# Patient Record
Sex: Male | Born: 1940
Health system: Southern US, Community
[De-identification: ages and names within clinical notes are randomized; demographics above are authoritative.]

## PROBLEM LIST (undated history)

## (undated) DIAGNOSIS — R011 Cardiac murmur, unspecified: Secondary | ICD-10-CM

## (undated) DIAGNOSIS — Z22322 Carrier or suspected carrier of Methicillin resistant Staphylococcus aureus: Secondary | ICD-10-CM

## (undated) DIAGNOSIS — J189 Pneumonia, unspecified organism: Secondary | ICD-10-CM

## (undated) DIAGNOSIS — Z87898 Personal history of other specified conditions: Secondary | ICD-10-CM

## (undated) DIAGNOSIS — R7301 Impaired fasting glucose: Secondary | ICD-10-CM

## (undated) DIAGNOSIS — N183 Chronic kidney disease, stage 3 unspecified: Secondary | ICD-10-CM

## (undated) DIAGNOSIS — N529 Male erectile dysfunction, unspecified: Secondary | ICD-10-CM

## (undated) DIAGNOSIS — R351 Nocturia: Secondary | ICD-10-CM

## (undated) DIAGNOSIS — I519 Heart disease, unspecified: Secondary | ICD-10-CM

## (undated) DIAGNOSIS — C4491 Basal cell carcinoma of skin, unspecified: Secondary | ICD-10-CM

## (undated) DIAGNOSIS — M1990 Unspecified osteoarthritis, unspecified site: Secondary | ICD-10-CM

## (undated) DIAGNOSIS — K760 Fatty (change of) liver, not elsewhere classified: Secondary | ICD-10-CM

## (undated) DIAGNOSIS — I2510 Atherosclerotic heart disease of native coronary artery without angina pectoris: Secondary | ICD-10-CM

## (undated) DIAGNOSIS — R569 Unspecified convulsions: Secondary | ICD-10-CM

## (undated) DIAGNOSIS — R519 Headache, unspecified: Secondary | ICD-10-CM

## (undated) DIAGNOSIS — G620 Drug-induced polyneuropathy: Secondary | ICD-10-CM

## (undated) DIAGNOSIS — Z8614 Personal history of Methicillin resistant Staphylococcus aureus infection: Secondary | ICD-10-CM

## (undated) DIAGNOSIS — C61 Malignant neoplasm of prostate: Secondary | ICD-10-CM

## (undated) DIAGNOSIS — K219 Gastro-esophageal reflux disease without esophagitis: Secondary | ICD-10-CM

## (undated) DIAGNOSIS — K5730 Diverticulosis of large intestine without perforation or abscess without bleeding: Secondary | ICD-10-CM

## (undated) DIAGNOSIS — Z974 Presence of external hearing-aid: Secondary | ICD-10-CM

## (undated) DIAGNOSIS — E785 Hyperlipidemia, unspecified: Secondary | ICD-10-CM

## (undated) DIAGNOSIS — K635 Polyp of colon: Secondary | ICD-10-CM

## (undated) DIAGNOSIS — R0602 Shortness of breath: Secondary | ICD-10-CM

## (undated) DIAGNOSIS — E8581 Light chain (AL) amyloidosis: Secondary | ICD-10-CM

## (undated) DIAGNOSIS — Z85828 Personal history of other malignant neoplasm of skin: Secondary | ICD-10-CM

## (undated) DIAGNOSIS — Z973 Presence of spectacles and contact lenses: Secondary | ICD-10-CM

## (undated) DIAGNOSIS — Z860100 Personal history of colon polyps, unspecified: Secondary | ICD-10-CM

## (undated) DIAGNOSIS — E859 Amyloidosis, unspecified: Secondary | ICD-10-CM

## (undated) DIAGNOSIS — R0600 Dyspnea, unspecified: Secondary | ICD-10-CM

## (undated) DIAGNOSIS — R0789 Other chest pain: Secondary | ICD-10-CM

## (undated) DIAGNOSIS — U071 COVID-19: Secondary | ICD-10-CM

## (undated) DIAGNOSIS — I10 Essential (primary) hypertension: Secondary | ICD-10-CM

## (undated) DIAGNOSIS — G40909 Epilepsy, unspecified, not intractable, without status epilepticus: Secondary | ICD-10-CM

## (undated) DIAGNOSIS — Z9981 Dependence on supplemental oxygen: Secondary | ICD-10-CM

## (undated) DIAGNOSIS — N189 Chronic kidney disease, unspecified: Secondary | ICD-10-CM

## (undated) DIAGNOSIS — T451X5A Adverse effect of antineoplastic and immunosuppressive drugs, initial encounter: Secondary | ICD-10-CM

## (undated) DIAGNOSIS — K449 Diaphragmatic hernia without obstruction or gangrene: Secondary | ICD-10-CM

## (undated) DIAGNOSIS — Z860101 Personal history of adenomatous and serrated colon polyps: Secondary | ICD-10-CM

## (undated) DIAGNOSIS — H269 Unspecified cataract: Secondary | ICD-10-CM

## (undated) DIAGNOSIS — G4733 Obstructive sleep apnea (adult) (pediatric): Secondary | ICD-10-CM

## (undated) HISTORY — DX: Unspecified convulsions: R56.9

## (undated) HISTORY — PX: ROTATOR CUFF REPAIR: SHX139

## (undated) HISTORY — DX: Heart disease, unspecified: I51.9

## (undated) HISTORY — DX: Other chest pain: R07.89

## (undated) HISTORY — DX: Unspecified cataract: H26.9

## (undated) HISTORY — PX: SPINE SURGERY: SHX786

## (undated) HISTORY — PX: TRIGGER FINGER RELEASE: SHX641

## (undated) HISTORY — DX: Essential (primary) hypertension: I10

## (undated) HISTORY — DX: Gastro-esophageal reflux disease without esophagitis: K21.9

## (undated) HISTORY — DX: Personal history of colonic polyps: Z86.010

## (undated) HISTORY — PX: VASECTOMY: SHX75

## (undated) HISTORY — DX: Atherosclerotic heart disease of native coronary artery without angina pectoris: I25.10

## (undated) HISTORY — DX: Carrier or suspected carrier of methicillin resistant Staphylococcus aureus: Z22.322

## (undated) HISTORY — DX: Amyloidosis, unspecified: E85.9

## (undated) HISTORY — DX: Personal history of adenomatous and serrated colon polyps: Z86.0101

## (undated) HISTORY — PX: OTHER SURGICAL HISTORY: SHX169

## (undated) HISTORY — DX: Male erectile dysfunction, unspecified: N52.9

## (undated) HISTORY — DX: Polyp of colon: K63.5

## (undated) HISTORY — PX: NECK SURGERY: SHX720

## (undated) HISTORY — PX: FOOT SURGERY: SHX648

## (undated) HISTORY — DX: Diaphragmatic hernia without obstruction or gangrene: K44.9

## (undated) HISTORY — PX: HERNIA REPAIR: SHX51

## (undated) HISTORY — PX: NOSE SURGERY: SHX723

## (undated) HISTORY — PX: BUNIONECTOMY: SHX129

## (undated) HISTORY — PX: SKIN BIOPSY: SHX1

## (undated) HISTORY — PX: CERVICAL FUSION: SHX112

## (undated) HISTORY — PX: SHOULDER SURGERY: SHX246

## (undated) HISTORY — DX: Diverticulosis of large intestine without perforation or abscess without bleeding: K57.30

## (undated) HISTORY — DX: Epilepsy, unspecified, not intractable, without status epilepticus: G40.909

## (undated) HISTORY — PX: KNEE ARTHROSCOPY: SUR90

## (undated) HISTORY — PX: CARDIAC CATHETERIZATION: SHX172

## (undated) HISTORY — DX: COVID-19: U07.1

## (undated) HISTORY — DX: Chronic kidney disease, unspecified: N18.9

## (undated) HISTORY — PX: NASAL SEPTUM SURGERY: SHX37

## (undated) HISTORY — DX: Impaired fasting glucose: R73.01

## (undated) HISTORY — DX: Unspecified osteoarthritis, unspecified site: M19.90

## (undated) HISTORY — DX: Hyperlipidemia, unspecified: E78.5

## (undated) HISTORY — DX: Basal cell carcinoma of skin, unspecified: C44.91

---

## 1986-04-25 DIAGNOSIS — Z8781 Personal history of (healed) traumatic fracture: Secondary | ICD-10-CM

## 1986-04-25 HISTORY — DX: Personal history of (healed) traumatic fracture: Z87.81

## 1986-04-25 HISTORY — PX: CERVICAL FUSION: SHX112

## 1992-04-25 HISTORY — PX: CARPAL TUNNEL RELEASE: SHX101

## 1994-04-25 HISTORY — PX: KNEE SURGERY: SHX244

## 1994-04-25 HISTORY — PX: KNEE ARTHROSCOPY: SUR90

## 1998-09-14 ENCOUNTER — Encounter: Payer: Self-pay | Admitting: *Deleted

## 1998-09-14 ENCOUNTER — Emergency Department (HOSPITAL_COMMUNITY): Admission: EM | Admit: 1998-09-14 | Discharge: 1998-09-14 | Payer: Self-pay | Admitting: Emergency Medicine

## 1998-11-20 ENCOUNTER — Ambulatory Visit (HOSPITAL_BASED_OUTPATIENT_CLINIC_OR_DEPARTMENT_OTHER): Admission: RE | Admit: 1998-11-20 | Discharge: 1998-11-20 | Payer: Self-pay | Admitting: Orthopaedic Surgery

## 1999-05-21 ENCOUNTER — Ambulatory Visit (HOSPITAL_COMMUNITY): Admission: RE | Admit: 1999-05-21 | Discharge: 1999-05-21 | Payer: Self-pay | Admitting: Cardiology

## 1999-05-21 ENCOUNTER — Encounter: Payer: Self-pay | Admitting: Cardiology

## 2000-09-12 ENCOUNTER — Encounter: Admission: RE | Admit: 2000-09-12 | Discharge: 2000-11-22 | Payer: Self-pay | Admitting: Pulmonary Disease

## 2000-12-29 ENCOUNTER — Ambulatory Visit (HOSPITAL_COMMUNITY): Admission: AD | Admit: 2000-12-29 | Discharge: 2000-12-29 | Payer: Self-pay | Admitting: Cardiology

## 2001-05-06 ENCOUNTER — Emergency Department (HOSPITAL_COMMUNITY): Admission: EM | Admit: 2001-05-06 | Discharge: 2001-05-06 | Payer: Self-pay | Admitting: Emergency Medicine

## 2001-09-07 ENCOUNTER — Ambulatory Visit (HOSPITAL_COMMUNITY): Admission: RE | Admit: 2001-09-07 | Discharge: 2001-09-07 | Payer: Self-pay | Admitting: Internal Medicine

## 2001-09-13 ENCOUNTER — Encounter: Payer: Self-pay | Admitting: Internal Medicine

## 2001-09-13 ENCOUNTER — Ambulatory Visit (HOSPITAL_COMMUNITY): Admission: RE | Admit: 2001-09-13 | Discharge: 2001-09-13 | Payer: Self-pay | Admitting: Internal Medicine

## 2002-09-23 ENCOUNTER — Ambulatory Visit (HOSPITAL_COMMUNITY): Admission: RE | Admit: 2002-09-23 | Discharge: 2002-09-23 | Payer: Self-pay | Admitting: *Deleted

## 2002-09-23 ENCOUNTER — Encounter: Payer: Self-pay | Admitting: *Deleted

## 2004-05-10 ENCOUNTER — Ambulatory Visit: Payer: Self-pay | Admitting: Internal Medicine

## 2004-05-17 ENCOUNTER — Ambulatory Visit: Payer: Self-pay | Admitting: Internal Medicine

## 2004-05-26 ENCOUNTER — Ambulatory Visit: Payer: Self-pay

## 2005-03-18 ENCOUNTER — Ambulatory Visit (HOSPITAL_BASED_OUTPATIENT_CLINIC_OR_DEPARTMENT_OTHER): Admission: RE | Admit: 2005-03-18 | Discharge: 2005-03-18 | Payer: Self-pay | Admitting: Otolaryngology

## 2005-03-27 ENCOUNTER — Ambulatory Visit: Payer: Self-pay | Admitting: Internal Medicine

## 2005-07-07 ENCOUNTER — Ambulatory Visit: Payer: Self-pay | Admitting: Internal Medicine

## 2005-07-20 ENCOUNTER — Ambulatory Visit: Payer: Self-pay | Admitting: Internal Medicine

## 2005-08-24 ENCOUNTER — Ambulatory Visit: Payer: Self-pay | Admitting: Cardiology

## 2005-12-17 ENCOUNTER — Emergency Department (HOSPITAL_COMMUNITY): Admission: EM | Admit: 2005-12-17 | Discharge: 2005-12-18 | Payer: Self-pay | Admitting: Emergency Medicine

## 2006-05-23 ENCOUNTER — Ambulatory Visit: Payer: Self-pay | Admitting: Internal Medicine

## 2006-06-10 ENCOUNTER — Ambulatory Visit: Payer: Self-pay | Admitting: Family Medicine

## 2006-07-03 ENCOUNTER — Ambulatory Visit: Payer: Self-pay | Admitting: Internal Medicine

## 2006-11-20 ENCOUNTER — Ambulatory Visit (HOSPITAL_COMMUNITY): Admission: RE | Admit: 2006-11-20 | Discharge: 2006-11-20 | Payer: Self-pay | Admitting: Internal Medicine

## 2006-11-20 ENCOUNTER — Ambulatory Visit: Payer: Self-pay | Admitting: Internal Medicine

## 2007-04-30 ENCOUNTER — Encounter: Payer: Self-pay | Admitting: *Deleted

## 2007-04-30 DIAGNOSIS — S32009A Unspecified fracture of unspecified lumbar vertebra, initial encounter for closed fracture: Secondary | ICD-10-CM | POA: Insufficient documentation

## 2007-04-30 DIAGNOSIS — I10 Essential (primary) hypertension: Secondary | ICD-10-CM | POA: Insufficient documentation

## 2007-04-30 DIAGNOSIS — K219 Gastro-esophageal reflux disease without esophagitis: Secondary | ICD-10-CM | POA: Insufficient documentation

## 2007-04-30 DIAGNOSIS — E785 Hyperlipidemia, unspecified: Secondary | ICD-10-CM | POA: Insufficient documentation

## 2007-04-30 DIAGNOSIS — R569 Unspecified convulsions: Secondary | ICD-10-CM | POA: Insufficient documentation

## 2007-04-30 DIAGNOSIS — Z8719 Personal history of other diseases of the digestive system: Secondary | ICD-10-CM | POA: Insufficient documentation

## 2007-04-30 DIAGNOSIS — Z9889 Other specified postprocedural states: Secondary | ICD-10-CM | POA: Insufficient documentation

## 2007-10-11 ENCOUNTER — Encounter: Payer: Self-pay | Admitting: Internal Medicine

## 2008-05-02 ENCOUNTER — Ambulatory Visit: Payer: Self-pay | Admitting: Cardiology

## 2008-05-09 ENCOUNTER — Ambulatory Visit: Payer: Self-pay

## 2008-05-13 ENCOUNTER — Ambulatory Visit: Payer: Self-pay | Admitting: Cardiology

## 2008-05-13 LAB — CONVERTED CEMR LAB
CO2: 31 meq/L (ref 19–32)
Calcium: 9.1 mg/dL (ref 8.4–10.5)
Chloride: 105 meq/L (ref 96–112)
Creatinine, Ser: 0.9 mg/dL (ref 0.4–1.5)
GFR calc Af Amer: 109 mL/min
Glucose, Bld: 88 mg/dL (ref 70–99)
HCT: 46.2 % (ref 39.0–52.0)
Hemoglobin: 16.1 g/dL (ref 13.0–17.0)
INR: 1 (ref 0.8–1.0)
MCV: 90.8 fL (ref 78.0–100.0)
Monocytes Absolute: 0.6 10*3/uL (ref 0.1–1.0)
Platelets: 165 10*3/uL (ref 150–400)
Prothrombin Time: 11.1 s (ref 10.9–13.3)
RBC: 5.09 M/uL (ref 4.22–5.81)
RDW: 11.7 % (ref 11.5–14.6)
WBC: 8.6 10*3/uL (ref 4.5–10.5)

## 2008-05-16 ENCOUNTER — Inpatient Hospital Stay (HOSPITAL_BASED_OUTPATIENT_CLINIC_OR_DEPARTMENT_OTHER): Admission: RE | Admit: 2008-05-16 | Discharge: 2008-05-16 | Payer: Self-pay | Admitting: Cardiology

## 2008-05-16 ENCOUNTER — Ambulatory Visit: Payer: Self-pay | Admitting: Cardiology

## 2008-05-29 ENCOUNTER — Ambulatory Visit: Payer: Self-pay | Admitting: Cardiology

## 2009-04-25 HISTORY — PX: CARDIAC CATHETERIZATION: SHX172

## 2009-05-18 ENCOUNTER — Ambulatory Visit (HOSPITAL_COMMUNITY): Admission: RE | Admit: 2009-05-18 | Discharge: 2009-05-18 | Payer: Self-pay | Admitting: Internal Medicine

## 2009-07-05 ENCOUNTER — Observation Stay (HOSPITAL_COMMUNITY): Admission: EM | Admit: 2009-07-05 | Discharge: 2009-07-08 | Payer: Self-pay | Admitting: Emergency Medicine

## 2009-07-05 ENCOUNTER — Ambulatory Visit: Payer: Self-pay | Admitting: Cardiology

## 2009-07-21 ENCOUNTER — Ambulatory Visit: Payer: Self-pay | Admitting: Cardiology

## 2009-07-21 DIAGNOSIS — R079 Chest pain, unspecified: Secondary | ICD-10-CM | POA: Insufficient documentation

## 2009-07-21 DIAGNOSIS — I2510 Atherosclerotic heart disease of native coronary artery without angina pectoris: Secondary | ICD-10-CM | POA: Insufficient documentation

## 2009-08-29 ENCOUNTER — Encounter: Admission: RE | Admit: 2009-08-29 | Discharge: 2009-08-29 | Payer: Self-pay | Admitting: Orthopaedic Surgery

## 2009-09-02 ENCOUNTER — Encounter: Payer: Self-pay | Admitting: Cardiology

## 2009-09-07 ENCOUNTER — Ambulatory Visit (HOSPITAL_COMMUNITY)
Admission: RE | Admit: 2009-09-07 | Discharge: 2009-09-08 | Payer: Self-pay | Source: Home / Self Care | Admitting: Orthopaedic Surgery

## 2010-02-18 ENCOUNTER — Encounter: Payer: Self-pay | Admitting: Cardiology

## 2010-02-19 ENCOUNTER — Encounter: Payer: Self-pay | Admitting: Cardiology

## 2010-02-19 ENCOUNTER — Ambulatory Visit: Payer: Self-pay | Admitting: Cardiology

## 2010-02-19 DIAGNOSIS — R42 Dizziness and giddiness: Secondary | ICD-10-CM | POA: Insufficient documentation

## 2010-03-03 ENCOUNTER — Encounter: Payer: Self-pay | Admitting: Cardiology

## 2010-03-03 ENCOUNTER — Ambulatory Visit (HOSPITAL_COMMUNITY): Admission: RE | Admit: 2010-03-03 | Discharge: 2010-03-03 | Payer: Self-pay | Admitting: Cardiology

## 2010-03-03 ENCOUNTER — Ambulatory Visit: Payer: Self-pay

## 2010-03-03 ENCOUNTER — Ambulatory Visit: Payer: Self-pay | Admitting: Cardiology

## 2010-03-11 ENCOUNTER — Ambulatory Visit: Payer: Self-pay | Admitting: Cardiology

## 2010-04-13 ENCOUNTER — Ambulatory Visit: Payer: Self-pay | Admitting: Cardiology

## 2010-05-16 ENCOUNTER — Encounter: Payer: Self-pay | Admitting: Otolaryngology

## 2010-05-27 NOTE — Assessment & Plan Note (Signed)
Summary: chest pain/dizziness  Medications Added OMEPRAZOLE 20 MG CPDR (OMEPRAZOLE) 1 tab once daily AZOR 5-20 MG TABS (AMLODIPINE-OLMESARTAN) 1/2 tab once daily CALCIUM CARBONATE 600 MG TABS (CALCIUM CARBONATE) 2 tab two times a day CRESTOR 40 MG TABS (ROSUVASTATIN CALCIUM) 1 tab at bedtime FISH OIL 1000 MG CAPS (OMEGA-3 FATTY ACIDS) 1 cap two times a day HYDROCHLOROTHIAZIDE 12.5 MG TABS (HYDROCHLOROTHIAZIDE) Take one tablet by mouth daily.        Visit Type:  rov Primary Provider:  Dr. Clelia Croft  CC:  chest pain, left arm pain, sob, and edema/ankles.....  History of Present Illness: Alex [name] is 70 years old and came in today for an unscheduled visit because of dizziness. These symptoms have been going on about 6 weeks. He describes a feeling of lightheadedness and a feeling of floating. He's had no associated weakness or numbness. These are not precipitated by postural changes or anything else that he can identify. They occur every day or every other day and lasts a few seconds.  He was also told of a heart murmur by his primary care physician.  He has a history of nonobstructive coronary disease and was involved in the reversal trial. His last catheterization was in March of 2011 at which time he had nonobstructive coronary disease.  His other problems include GERD and hyperlipidemia.  Current Medications (verified): 1)  Aspirin 81 Mg  Tabs (Aspirin) .... Take One Tablet Once Daily 2)  Dilantin 100 Mg  Caps (Phenytoin Sodium Extended) .... 2 By Mouth Two Times A Day 3)  Nitrostat 0.4 Mg Subl (Nitroglycerin) .[name] Kitchen.. 1 By Mouth As Needed  For Chest Pain 4)  Omeprazole 20 Mg Cpdr (Omeprazole) .[name] Kitchen.. 1 Tab Once Daily 5)  Azor 5-20 Mg Tabs (Amlodipine-Olmesartan) .... 1/2 Tab Once Daily 6)  Calcium Carbonate 600 Mg Tabs (Calcium Carbonate) .... 2 Tab Two Times A Day 7)  Crestor 40 Mg Tabs (Rosuvastatin Calcium) .[name] Kitchen.. 1 Tab At Bedtime 8)  Fish Oil 1000 Mg Caps (Omega-3 Fatty Acids) .[name] Kitchen.. 1 Cap  Two Times A Day 9)  Hydrochlorothiazide 12.5 Mg Tabs (Hydrochlorothiazide) .... Take One Tablet By Mouth Daily.  Allergies: 1)  ! * Altace  Past History:  Past Medical History: Reviewed history from 04/30/2007 and no changes required. DIVERTICULOSIS, COLON, HX OF (ICD-V12.79) * NONOBSTRUCTIVE CORONARY ARTERY DISEASE SEIZURE DISORDER (ICD-780.39) GASTROESOPHAGEAL REFLUX DISEASE (ICD-530.81) HYPERTENSION (ICD-401.9) HYPERLIPIDEMIA (ICD-272.4)    Review of Systems       ROS is negative except as outlined in HPI.   Vital Signs:  Patient profile:   70 year old male Height:      70 inches Weight:      225.4 pounds Pulse rate:   61 / minute Pulse (ortho):   57 / minute Pulse rhythm:   irregular BP sitting:   108 / 70  (left arm) BP standing:   113 / 72 Cuff size:   large  Vitals Entered By: Danielle Rankin, CMA (February 19, 2010 8:37 AM)  Serial Vital Signs/Assessments:  Time      Position  BP       Pulse  Resp  Temp     By 9:21 AM   Lying [place]  111/70   54                    Danielle Rankin, CMA 9:24 AM   Sitting   112/73   55  Danielle Rankin, CMA 9:25 AM   Standing  113/72   [address], [place] 9:28 AM   Standing  117/74   [address] [place] 9:30 AM   Standing  118/79   60                    Danielle Rankin, [place]  Comments: 9:21 AM no sxms By: Danielle Rankin, CMA  9:24 AM no sxms By: Danielle Rankin, CMA  9:25 AM lightheaeded By: Danielle Rankin, CMA  9:28 AM no sxms By: Danielle Rankin, CMA  9:30 AM no sxms By: Danielle Rankin, CMA    Physical Exam  Additional Exam:  Gen. Well-nourished, in no distress   Neck: No JVD, thyroid not enlarged, no carotid bruits Lungs: No tachypnea, clear without rales, rhonchi or wheezes Cardiovascular: Rhythm regular, PMI not displaced,  heart sounds  normal, grade 2/6 systolic ejection murmur, no peripheral edema, pulses normal in all 4 extremities. Abdomen: BS normal, abdomen soft and non-tender  without masses or organomegaly, no hepatosplenomegaly. MS: No deformities, no cyanosis or clubbing   Neuro:  No focal sns   Skin:  no lesions    Impression & Recommendations:  Problem # 1:  DIZZINESS (ICD-780.4)  I'm not sure of the etiology of the dizziness. It could be related to sinus pauses since he has a borderline bradycardia. We will stop the Coreg. It could be a form of TIA and we'll plan to get a 2-D echocardiogram and carotid Dopplers. If he is not better over the next week off of Coreg and we'll get a one-week monitor. I'll see him back in a few weeks.  Orders: EKG w/ Interpretation (93000) Carotid Duplex (Carotid Duplex) Echocardiogram (Echo)  Problem # 2:  CAD, NATIVE VESSEL (ICD-414.01)  He has nonobstructive disease by last catheterization in March of 2011. The following medications were removed from the medication list:    Metoprolol Succinate 25 Mg Tb24 (Metoprolol succinate) .[name] Kitchen... Take one tablet once daily    Coreg 3.125 Mg Tabs (Carvedilol) .[name] Kitchen... 1 by mouth two times a day His updated medication list for this problem includes:    Aspirin 81 Mg Tabs (Aspirin) .[name] Kitchen... Take one tablet once daily    Nitrostat 0.4 Mg Subl (Nitroglycerin) .[name] Kitchen... 1 by mouth as needed  for chest pain    Azor 5-20 Mg Tabs (Amlodipine-olmesartan) .[name] Kitchen... 1/2 tab once daily  The following medications were removed from the medication list:    Metoprolol Succinate 25 Mg Tb24 (Metoprolol succinate) .[name] Kitchen... Take one tablet once daily His updated medication list for this problem includes:    Aspirin 81 Mg Tabs (Aspirin) .[name] Kitchen... Take one tablet once daily    Nitrostat 0.4 Mg Subl (Nitroglycerin) .[name] Kitchen... 1 by mouth as needed  for chest pain    Coreg 3.125 Mg Tabs (Carvedilol) .[name] Kitchen... 1 by mouth two times a day    Azor 5-20 Mg Tabs (Amlodipine-olmesartan) .[name] Kitchen... 1/2 tab once daily  Orders: EKG w/ Interpretation (93000) Carotid Duplex (Carotid Duplex) Echocardiogram (Echo)  Problem # 3:  HYPERLIPIDEMIA  (ICD-272.4)  This is managed with Crestor. His updated medication list for this problem includes:    Crestor 40 Mg Tabs (Rosuvastatin calcium) .[name] Kitchen... 1 tab at bedtime  His updated medication list for this problem  includes:    Crestor 40 Mg Tabs (Rosuvastatin calcium) .[name] Kitchen... 1 tab at bedtime  Patient Instructions: 1)  Your physician has requested that you have a carotid duplex. This test is an ultrasound of the carotid arteries in your neck. It looks at blood flow through these arteries that supply the brain with blood. Allow one hour for this exam. There are no restrictions or special instructions. 2)  Your physician has requested that you have an echocardiogram.  Echocardiography is a painless test that uses sound waves to create images of your heart. It provides your doctor with information about the size and shape of your heart and how well your heart's chambers and valves are working.  This procedure takes approximately one hour. There are no restrictions for this procedure. 3)  Stop Coreg (carvedilol). 4)  If your symptoms are not better off the coreg by the middle of next week, then call our office and we will order a 1 week heart monitor for you.  5)  Restart Hydrochlorothiazide (HCTZ) 12.5mg  once daily. 6)  Your physician recommends that you schedule a follow-up appointment in: 3 weeks.

## 2010-05-27 NOTE — Assessment & Plan Note (Signed)
Summary: 3wk f/u echo and carortid  Medications Added CALCIUM CARBONATE 600 MG TABS (CALCIUM CARBONATE) Take 1 tablet by mouth two times a day        Visit Type:  Follow-up Primary Provider:  Dr. Clelia Croft  CC:  Chest pains.  History of Present Illness: Alex [name] is 70 years old and came in today for followup evaluation of dizziness. These symptoms have been going on about 6 weeks. He describes a feeling of lightheadedness and a feeling of floating. He's had no associated weakness or numbness. These are not precipitated by postural changes or anything .  We evaluated him with any echocardiogram which was normal and carotid Dopplers which showed 0-39% bilateral. His symptoms have improved slightly since we stopped the Coreg but he still has the same symptoms. Most of them are not related to exertion.  He has a history of nonobstructive coronary disease and was involved in the reversal trial. His last catheterization was in March of 2011 at which time he had nonobstructive coronary disease.  His other problems include GERD and hyperlipidemia.  Current Medications (verified): 1)  Aspirin 81 Mg  Tabs (Aspirin) .... Take One Tablet Once Daily 2)  Dilantin 100 Mg  Caps (Phenytoin Sodium Extended) .... 2 By Mouth Two Times A Day 3)  Nitrostat 0.4 Mg Subl (Nitroglycerin) .[name] Kitchen.. 1 By Mouth As Needed  For Chest Pain 4)  Omeprazole 20 Mg Cpdr (Omeprazole) .[name] Kitchen.. 1 Tab Once Daily 5)  Azor 5-20 Mg Tabs (Amlodipine-Olmesartan) .... 1/2 Tab Once Daily 6)  Calcium Carbonate 600 Mg Tabs (Calcium Carbonate) .... Take 1 Tablet By Mouth Two Times A Day 7)  Crestor 40 Mg Tabs (Rosuvastatin Calcium) .[name] Kitchen.. 1 Tab At Bedtime 8)  Fish Oil 1000 Mg Caps (Omega-3 Fatty Acids) .[name] Kitchen.. 1 Cap Two Times A Day 9)  Hydrochlorothiazide 12.5 Mg Tabs (Hydrochlorothiazide) .... Take One Tablet By Mouth Daily.  Allergies: 1)  ! * Altace  Past History:  Past Medical History: Reviewed history from 04/30/2007 and no changes  required. DIVERTICULOSIS, COLON, HX OF (ICD-V12.79) * NONOBSTRUCTIVE CORONARY ARTERY DISEASE SEIZURE DISORDER (ICD-780.39) GASTROESOPHAGEAL REFLUX DISEASE (ICD-530.81) HYPERTENSION (ICD-401.9) HYPERLIPIDEMIA (ICD-272.4)    Review of Systems       ROS is negative except as outlined in HPI.   Vital Signs:  Patient profile:   70 year old male Height:      70 inches Weight:      224 pounds BMI:     32.26 Pulse rate:   64 / minute Pulse rhythm:   regular Resp:     18 per minute BP sitting:   108 / 72  (left arm) Cuff size:   large  Vitals Entered By: Vikki Ports (March 11, 2010 2:48 PM)  Physical Exam  Additional Exam:  Gen. Well-nourished, in no distress   Neck: No JVD, thyroid not enlarged, no carotid bruits Lungs: No tachypnea, clear without rales, rhonchi or wheezes Cardiovascular: Rhythm regular, PMI not displaced,  heart sounds  normal, no murmurs or gallops, no peripheral edema, pulses normal in all 4 extremities. Abdomen: BS normal, abdomen soft and non-tender without masses or organomegaly, no hepatosplenomegaly. MS: No deformities, no cyanosis or clubbing   Neuro:  No focal sns   Skin:  no lesions    Impression & Recommendations:  Problem # 1:  DIZZINESS (ICD-780.4) Etiol not clear.  Little better after d/c coreg.  Echo and carotids ok.  Will get event monitor. Orders: EKG w/ Interpretation (93000) Event (Event)  Problem #  2:  CAD, NATIVE VESSEL (ICD-414.01) Has non-obstr CAD.  No symptoms.  Stable. His updated medication list for this problem includes:    Aspirin 81 Mg Tabs (Aspirin) .[name] Kitchen... Take one tablet once daily    Nitrostat 0.4 Mg Subl (Nitroglycerin) .[name] Kitchen... 1 by mouth as needed  for chest pain    Azor 5-20 Mg Tabs (Amlodipine-olmesartan) .[name] Kitchen... 1/2 tab once daily  Orders: EKG w/ Interpretation (93000) Event (Event)  Problem # 3:  HYPERTENSION (ICD-401.9) Controlled on current meds. D/C HCTZ because of possible hypotension. The following  medications were removed from the medication list:    Hydrochlorothiazide 12.5 Mg Tabs (Hydrochlorothiazide) .[name] Kitchen... Take one tablet by mouth daily. His updated medication list for this problem includes:    Aspirin 81 Mg Tabs (Aspirin) .[name] Kitchen... Take one tablet once daily    Azor 5-20 Mg Tabs (Amlodipine-olmesartan) .[name] Kitchen... 1/2 tab once daily  Patient Instructions: 1)  Stop Hydrochlorothiazide (HCTZ).  2)  Please check and record your blood pressure readings every day.  3)  Your physician recommends that you schedule a follow-up appointment in: 3 weeks. 4)  Your physician has recommended that you wear an event monitor.  Event monitors are medical devices that record the heart's electrical activity. Doctors most often use these monitors to diagnose arrhythmias. Arrhythmias are problems with the speed or rhythm of the heartbeat. The monitor is a small, portable device. You can wear one while you do your normal daily activities. This is usually used to diagnose what is causing palpitations/syncope (passing out).

## 2010-05-27 NOTE — Progress Notes (Signed)
Summary: Patient's At Home Vitals   Patient's At Home Vitals   Imported By: Roderic Ovens 03/03/2010 10:14:10  _____________________________________________________________________  External Attachment:    Type:   Image     Comment:   External Document

## 2010-05-27 NOTE — Assessment & Plan Note (Signed)
Summary: eph  Medications Added DILANTIN 100 MG  CAPS (PHENYTOIN SODIUM EXTENDED) 2 by mouth two times a day NITROSTAT 0.4 MG SUBL (NITROGLYCERIN) 1 by mouth as needed  for chest pain COREG 3.125 MG TABS (CARVEDILOL) 1 by mouth two times a day NEXIUM 40 MG CPDR (ESOMEPRAZOLE MAGNESIUM) take one tab by mouth two times a day      Allergies Added:    History of Present Illness: The patient is 70 years old and return for management of CAD. He originally was evaluated for chest pain in 2002 and was found to have nonobstructive CAD and was enrolled in the reversal trial. He did well after that but recently has developed chest tightness. He was admitted on March 15 and underwent catheterization and was found to have nonobstructive disease again. Since that time his continued to have chest tightness which sometimes will last all day.  He does have a history of reflux esophagitis and has been on Prilosec on a regular basis. He said he had endoscopy by Dr. Carolan Clines about 10 years ago. This showed severe esophagitis.  He also has hyperlipidemia and had a lipid profile in the hospital with an HDL of 50 and an LDL of 79 and a total cholesterol of 148  Current Medications (verified): 1)  Diovan 80 Mg  Tabs (Valsartan) .... Take One Tablet Once Daily 2)  Hydrochlorothiazide 12.5 Mg  Tabs (Hydrochlorothiazide) .... Take One Tablet Once Daily 3)  Aspirin 81 Mg  Tabs (Aspirin) .... Take One Tablet Once Daily 4)  Dilantin 100 Mg  Caps (Phenytoin Sodium Extended) .... 2 By Mouth Two Times A Day 5)  Metoprolol Succinate 25 Mg  Tb24 (Metoprolol Succinate) .... Take One Tablet Once Daily 6)  Nitrostat 0.4 Mg Subl (Nitroglycerin) .[name] Kitchen.. 1 By Mouth As Needed  For Chest Pain 7)  Coreg 3.125 Mg Tabs (Carvedilol) .[name] Kitchen.. 1 By Mouth Two Times A Day  Allergies (verified): 1)  ! * Altace  Past History:  Past Medical History: Reviewed history from 04/30/2007 and no changes required. DIVERTICULOSIS, COLON, HX OF  (ICD-V12.79) * NONOBSTRUCTIVE CORONARY ARTERY DISEASE SEIZURE DISORDER (ICD-780.39) GASTROESOPHAGEAL REFLUX DISEASE (ICD-530.81) HYPERTENSION (ICD-401.9) HYPERLIPIDEMIA (ICD-272.4)    Review of Systems       ROS is negative except as outlined in HPI.   Vital Signs:  Patient profile:   70 year old male Height:      70 inches Weight:      228 pounds BMI:     32.83 Pulse rate:   61 / minute Resp:     16 per minute BP sitting:   118 / 70  (left arm)  Vitals Entered By: Marrion Coy, CNA (July 21, 2009 1:57 PM)  Physical Exam  Additional Exam:  Gen. Well-nourished, in no distress   Neck: No JVD, thyroid not enlarged, no carotid bruits Lungs: No tachypnea, clear without rales, rhonchi or wheezes Cardiovascular: Rhythm regular, PMI not displaced,  heart sounds  normal, no murmurs or gallops, no peripheral edema, pulses normal in all 4 extremities. Abdomen: BS normal, abdomen soft and non-tender without masses or organomegaly, no hepatosplenomegaly. MS: No deformities, no cyanosis or clubbing   Neuro:  No focal sns   Skin:  no lesions    Impression & Recommendations:  Problem # 1:  CHEST PAIN-UNSPECIFIED (ICD-786.50) The etiology of his chest tightness is not clear. I think the most likely possibilities are reflux or muscular pain. We will give him a trial of Nexium 40 mg twice  a day for 2 weeks to see if this improves his symptoms. He is currently taking Prilosec. If he is not better, I suggested he see Dr. Clelia Croft who can decide about further evaluation. His updated medication list for this problem includes:    Aspirin 81 Mg Tabs (Aspirin) .[name] Kitchen... Take one tablet once daily    Metoprolol Succinate 25 Mg Tb24 (Metoprolol succinate) .[name] Kitchen... Take one tablet once daily    Nitrostat 0.4 Mg Subl (Nitroglycerin) .[name] Kitchen... 1 by mouth as needed  for chest pain    Coreg 3.125 Mg Tabs (Carvedilol) .[name] Kitchen... 1 by mouth two times a day  Problem # 2:  CAD, NATIVE VESSEL (ICD-414.01)  He has  nonobstructive disease at recent catheterization. We will continue secondary risk factor modification. His updated medication list for this problem includes:    Aspirin 81 Mg Tabs (Aspirin) .[name] Kitchen... Take one tablet once daily    Metoprolol Succinate 25 Mg Tb24 (Metoprolol succinate) .[name] Kitchen... Take one tablet once daily    Nitrostat 0.4 Mg Subl (Nitroglycerin) .[name] Kitchen... 1 by mouth as needed  for chest pain    Coreg 3.125 Mg Tabs (Carvedilol) .[name] Kitchen... 1 by mouth two times a day  Orders: EKG w/ Interpretation (93000)  Problem # 3:  HYPERLIPIDEMIA (ICD-272.4) He had a Lipid profile in the hospital and we will continue current therapy.  Patient Instructions: 1)  Your physician has recommended you make the following change in your medication: 1) start Nexium 40mg  two times a day 2)  Your physician wants you to follow-up in:  1 year with Dr. Clifton James. You will receive a reminder letter in the mail two months in advance. If you don't receive a letter, please call our office to schedule the follow-up appointment. 3)  You should followup with Dr. Clelia Croft if your symptoms do not get bettter with the change to nexium. Prescriptions: NEXIUM 40 MG CPDR (ESOMEPRAZOLE MAGNESIUM) take one tab by mouth two times a day  #40 x 0   Entered by:   Sherri Rad, RN, BSN   Authorized by:   Lenoria Farrier, MD, [facility]   Signed by:   Lenoria Farrier, MD, [facility] on 07/21/2009   Method used:   Samples Given   RxID:   [id number] NEXIUM 40 MG CPDR (ESOMEPRAZOLE MAGNESIUM) take one tab by mouth two times a day  #60 x 6   Entered by:   Sherri Rad, RN, BSN   Authorized by:   Lenoria Farrier, MD, [facility]   Signed by:   Sherri Rad, RN, BSN on 07/21/2009   Method used:   Print then Give to Patient   RxID:   [id number] NITROSTAT 0.4 MG SUBL (NITROGLYCERIN) 1 by mouth as needed  for chest pain  #25 x 6   Entered by:   Marrion Coy, CNA   Authorized by:   Lenoria Farrier, MD, [facility]   Signed by:   Marrion Coy, CNA on 07/21/2009   Method used:   Electronically to        [facility]* (retail)       [address] [place], [place]  [address]       Ph: [phone] or [phone]       Fax: [phone]   RxID:   [id number]

## 2010-05-27 NOTE — Letter (Signed)
Summary: [facility] Surgical Clearance  [facility] Surgical Clearance   Imported By: Roderic Ovens 10/08/2009 15:51:00  _____________________________________________________________________  External Attachment:    Type:   Image     Comment:   External Document

## 2010-06-24 ENCOUNTER — Encounter: Payer: Self-pay | Admitting: Cardiovascular Disease

## 2010-06-24 ENCOUNTER — Ambulatory Visit (INDEPENDENT_AMBULATORY_CARE_PROVIDER_SITE_OTHER): Payer: 59 | Admitting: Cardiovascular Disease

## 2010-06-24 DIAGNOSIS — I2510 Atherosclerotic heart disease of native coronary artery without angina pectoris: Secondary | ICD-10-CM

## 2010-06-24 DIAGNOSIS — I10 Essential (primary) hypertension: Secondary | ICD-10-CM

## 2010-07-01 NOTE — Assessment & Plan Note (Signed)
Summary: per check out 07/21/09/ hm per pt call/mj/tmj   Visit Type:  Follow-up Primary Provider:  Dr. Clelia Croft  CC:  shortness of breath / CP / dizziness.  History of Present Illness: 70 years male with history of HTN, non-obstructive CAD, last cath 2011, GERD and hyperlipidemia with mild carotid artery disease who is here today for follow up. He has been followed in the past by Dr. Juanda Chance for his cardiac issues. He has had a recent workup for dizziness including carotid artery dopplers (mild bilateral disease, <39%) and normal echo. Coreg was stopped.   He tells me today that he has occasional chest pains. These are sharp and last for a few seconds. No associated  SOB, diaphoresis, n/v. His dizziness has improved since he stopped the HCTZ and Coreg.    Preventive Screening-Counseling & Management  Alcohol-Tobacco     Smoking Status: quit  Caffeine-Diet-Exercise     Does Patient Exercise: no      Drug Use:  no.    Current Medications (verified): 1)  Aspirin 81 Mg  Tabs (Aspirin) .... Take One Tablet Once Daily 2)  Dilantin 100 Mg  Caps (Phenytoin Sodium Extended) .... 2 By Mouth Two Times A Day 3)  Nitrostat 0.4 Mg Subl (Nitroglycerin) .[name] Kitchen.. 1 By Mouth As Needed  For Chest Pain 4)  Omeprazole 20 Mg Cpdr (Omeprazole) .[name] Kitchen.. 1 Tab Once Daily 5)  Azor 5-20 Mg Tabs (Amlodipine-Olmesartan) .... 1/2 Tab Once Daily 6)  Calcium Carbonate 600 Mg Tabs (Calcium Carbonate) .... Take 1 Tablet By Mouth Two Times A Day 7)  Crestor 40 Mg Tabs (Rosuvastatin Calcium) .[name] Kitchen.. 1 Tab At Bedtime 8)  Fish Oil 1000 Mg Caps (Omega-3 Fatty Acids) .[name] Kitchen.. 1 Cap Two Times A Day 9)  Multivitamins  Caps (Multiple Vitamin) .... Once Daily  Allergies (verified): 1)  ! * Altace  Past History:  Past Medical History: DIVERTICULOSIS, COLON, HX OF (ICD-V12.79) CAD-moderate disease by cath 2011 SEIZURE DISORDER (ICD-780.39) GASTROESOPHAGEAL REFLUX DISEASE (ICD-530.81) HYPERTENSION (ICD-401.9) HYPERLIPIDEMIA (ICD-272.4)    Past Surgical History: Reviewed history from 04/30/2007 and no changes required. COMPRESSION FRACTURE, L1 VERTEBRA (ICD-805.4) * C-SPINE FUSION VASECTOMY, HX OF (ICD-V26.52) ROTATOR CUFF REPAIR, LEFT, HX OF (ICD-V45.89) ROTATOR CUFF REPAIR, RIGHT, HX OF X2 (ICD-V45.89) * DEVIATED SEPTUM REPAIR * BILATERAL TRIGGER FINGER RELEASE CARPAL TUNNEL RELEASE, LEFT, HX OF (ICD-V45.89) ARTHROSCOPY, LEFT KNEE, HX OF (ICD-V45.89) BUNIONECTOMY, HX OF X3 (ICD-V15.89)  Family History: Reviewed history and no changes required. Family History of Diabetes:  Family History of Hyperlipidemia:  Family History of Hypertension:  Family History of Seizure Disorder:  Family History of Sudden Cardiac Death:  Family History of Thyroid Disease:   Mother-deceased, natural causes at 61 yo Father-deceased, age 77 MI 65 sisters-alive, no CAD  Social History: Reviewed history and no changes required. Full Time-Maintenance work Married, 2 children Tobacco Use - Former. Quit 1971.  Alcohol Use - yes - occ Regular Exercise - no Drug Use - no Does Patient Exercise:  no Drug Use:  no  Review of Systems       The patient complains of chest pain.  The patient denies fatigue, malaise, fever, weight gain/loss, vision loss, decreased hearing, hoarseness, palpitations, shortness of breath, prolonged cough, wheezing, sleep apnea, coughing up blood, abdominal pain, blood in stool, nausea, vomiting, diarrhea, heartburn, incontinence, blood in urine, muscle weakness, joint pain, leg swelling, rash, skin lesions, headache, fainting, dizziness, depression, anxiety, enlarged lymph nodes, easy bruising or bleeding, and environmental allergies.    Vital Signs:  Patient profile:   70 year old male Height:      70 inches Weight:      229 pounds BMI:     32.98 Pulse rate:   65 / minute BP sitting:   123 / 75  (left arm) Cuff size:   regular  Vitals Entered By: Caralee Ates CMA (June 24, 2010 2:42 PM)  Physical  Exam  General:  General: Well developed, well nourished, NAD Musculoskeletal: Muscle strength 5/5 all ext Psychiatric: Mood and affect normal Neck: No JVD, no carotid bruits, no thyromegaly, no lymphadenopathy. Lungs:Clear bilaterally, no wheezes, rhonci, crackles CV: RRR no murmurs, gallops rubs Abdomen: soft, NT, ND, BS present Extremities: No edema, pulses 2+.    EKG  Procedure date:  06/25/2010  Findings:      NSR, rate 61 bpm. LAFB. Possible prior inferior infarct.   Impression & Recommendations:  Problem # 1:  CAD, NATIVE VESSEL (ICD-414.01) Stable. Continue ASA and statin. His beta blocker was stopped because of dizziness.  His updated medication list for this problem includes:    Aspirin 81 Mg Tabs (Aspirin) .[name] Kitchen... Take one tablet once daily    Nitrostat 0.4 Mg Subl (Nitroglycerin) .[name] Kitchen... 1 by mouth as needed  for chest pain    Azor 5-20 Mg Tabs (Amlodipine-olmesartan) .[name] Kitchen... 1/2 tab once daily  Problem # 2:  HYPERTENSION (ICD-401.9) Well controlled on current therapy. No changes.   His updated medication list for this problem includes:    Aspirin 81 Mg Tabs (Aspirin) .[name] Kitchen... Take one tablet once daily    Azor 5-20 Mg Tabs (Amlodipine-olmesartan) .[name] Kitchen... 1/2 tab once daily  Problem # 3:  HYPERLIPIDEMIA (ICD-272.4) Followed by Dr. Clelia Croft and well controlled per pt. Continue statin.   His updated medication list for this problem includes:    Crestor 40 Mg Tabs (Rosuvastatin calcium) .[name] Kitchen... 1 tab at bedtime  Patient Instructions: 1)  Your physician recommends that you schedule a follow-up appointment in: 6 months.

## 2010-07-13 LAB — BASIC METABOLIC PANEL
BUN: 19 mg/dL (ref 6–23)
CO2: 30 mEq/L (ref 19–32)
Calcium: 9.7 mg/dL (ref 8.4–10.5)
Creatinine, Ser: 0.99 mg/dL (ref 0.4–1.5)
GFR calc non Af Amer: >60 mL/min (ref >60)
Glucose, Bld: 93 mg/dL (ref 70–99)
Sodium: 141 mEq/L (ref 135–145)

## 2010-07-13 LAB — CBC
HCT: 47.7 % (ref 39.0–52.0)
MCHC: 35.2 g/dL (ref 30.0–36.0)
RDW: 12.7 % (ref 11.5–15.5)
WBC: 7.5 10*3/uL (ref 4.0–10.5)

## 2010-07-19 LAB — COMPREHENSIVE METABOLIC PANEL
Alkaline Phosphatase: 43 U/L (ref 39–117)
CO2: 29 mEq/L (ref 19–32)
Calcium: 8.6 mg/dL (ref 8.4–10.5)
Chloride: 104 mEq/L (ref 96–112)
Creatinine, Ser: 0.94 mg/dL (ref 0.4–1.5)
Potassium: 3.6 mEq/L (ref 3.5–5.1)
Sodium: 142 mEq/L (ref 135–145)

## 2010-07-19 LAB — CBC
HCT: 43.6 % (ref 39.0–52.0)
Hemoglobin: 15.1 g/dL (ref 13.0–17.0)
Hemoglobin: 15.9 g/dL (ref 13.0–17.0)
MCHC: 34.5 g/dL (ref 30.0–36.0)
MCV: 91.5 fL (ref 78.0–100.0)
MCV: 92 fL (ref 78.0–100.0)
RBC: 4.63 MIL/uL (ref 4.22–5.81)
RBC: 4.77 MIL/uL (ref 4.22–5.81)
RBC: 4.98 MIL/uL (ref 4.22–5.81)
RDW: 13.1 % (ref 11.5–15.5)
WBC: 7.4 10*3/uL (ref 4.0–10.5)
WBC: 7.9 10*3/uL (ref 4.0–10.5)

## 2010-07-19 LAB — POCT I-STAT, CHEM 8
Calcium, Ion: 1.1 mmol/L — ABNORMAL LOW (ref 1.12–1.32)
Creatinine, Ser: 0.9 mg/dL (ref 0.4–1.5)
Glucose, Bld: 98 mg/dL (ref 70–99)
HCT: 43 % (ref 39.0–52.0)
Potassium: 3.6 mEq/L (ref 3.5–5.1)
Sodium: 140 mEq/L (ref 135–145)

## 2010-07-19 LAB — DIFFERENTIAL
Basophils Absolute: 0 10*3/uL (ref 0.0–0.1)
Monocytes Relative: 7 % (ref 3–12)
Neutro Abs: 4.8 10*3/uL (ref 1.7–7.7)

## 2010-07-19 LAB — HEPARIN LEVEL (UNFRACTIONATED)
Heparin Unfractionated: 0.6 IU/mL (ref 0.30–0.70)
Heparin Unfractionated: 0.74 IU/mL — ABNORMAL HIGH (ref 0.30–0.70)

## 2010-07-19 LAB — PROTIME-INR: Prothrombin Time: 14.5 seconds (ref 11.6–15.2)

## 2010-07-19 LAB — CARDIAC PANEL(CRET KIN+CKTOT+MB+TROPI)
CK, MB: 0.6 ng/mL (ref 0.3–4.0)
Relative Index: INVALID (ref 0.0–2.5)
Total CK: 52 U/L (ref 7–232)
Troponin I: 0.01 ng/mL (ref 0.00–0.06)

## 2010-07-19 LAB — LIPID PANEL
Total CHOL/HDL Ratio: 3 RATIO
VLDL: 19 mg/dL (ref 0–40)

## 2010-07-19 LAB — POCT CARDIAC MARKERS
CKMB, poc: <1 ng/mL — ABNORMAL LOW (ref 1.0–8.0)
Troponin i, poc: <0.05 ng/mL (ref 0.00–0.09)

## 2010-07-19 LAB — CK TOTAL AND CKMB (NOT AT ARMC)
CK, MB: 0.9 ng/mL (ref 0.3–4.0)
Relative Index: INVALID (ref 0.0–2.5)

## 2010-08-02 ENCOUNTER — Encounter (HOSPITAL_COMMUNITY)
Admission: RE | Admit: 2010-08-02 | Discharge: 2010-08-02 | Disposition: A | Discharge status: Home / Self Care | Payer: Medicare Other | Source: Ambulatory Visit | Attending: Otolaryngology | Admitting: Otolaryngology

## 2010-08-02 LAB — CBC
HCT: 45.2 % (ref 39.0–52.0)
Hemoglobin: 16 g/dL (ref 13.0–17.0)
WBC: 7.5 10*3/uL (ref 4.0–10.5)

## 2010-08-02 LAB — BASIC METABOLIC PANEL
CO2: 27 mEq/L (ref 19–32)
Glucose, Bld: 98 mg/dL (ref 70–99)
Potassium: 4.4 mEq/L (ref 3.5–5.1)
Sodium: 140 mEq/L (ref 135–145)

## 2010-08-09 ENCOUNTER — Observation Stay (HOSPITAL_COMMUNITY)
Admission: RE | Admit: 2010-08-09 | Discharge: 2010-08-10 | Disposition: A | Discharge status: Home / Self Care | Payer: Medicare Other | Source: Ambulatory Visit | Attending: Otolaryngology | Admitting: Otolaryngology

## 2010-08-09 DIAGNOSIS — Z01810 Encounter for preprocedural cardiovascular examination: Secondary | ICD-10-CM | POA: Insufficient documentation

## 2010-08-09 DIAGNOSIS — I2510 Atherosclerotic heart disease of native coronary artery without angina pectoris: Secondary | ICD-10-CM | POA: Insufficient documentation

## 2010-08-09 DIAGNOSIS — J343 Hypertrophy of nasal turbinates: Secondary | ICD-10-CM | POA: Insufficient documentation

## 2010-08-09 DIAGNOSIS — I10 Essential (primary) hypertension: Secondary | ICD-10-CM | POA: Insufficient documentation

## 2010-08-09 DIAGNOSIS — G4733 Obstructive sleep apnea (adult) (pediatric): Secondary | ICD-10-CM | POA: Insufficient documentation

## 2010-08-09 DIAGNOSIS — J342 Deviated nasal septum: Principal | ICD-10-CM | POA: Insufficient documentation

## 2010-08-09 DIAGNOSIS — K219 Gastro-esophageal reflux disease without esophagitis: Secondary | ICD-10-CM | POA: Insufficient documentation

## 2010-08-09 DIAGNOSIS — G40909 Epilepsy, unspecified, not intractable, without status epilepticus: Secondary | ICD-10-CM | POA: Insufficient documentation

## 2010-08-20 NOTE — Op Note (Signed)
NAME:  Alex [name], Alex [name] NO.:  [id number]  MEDICAL RECORD NO.:  [id number]           PATIENT TYPE:  O  LOCATION:  2604                         FACILITY:  [facility]  PHYSICIAN:  Zola Button T. Lazarus Salines, M.D. DATE OF BIRTH:  1941-01-06  DATE OF PROCEDURE:  08/09/2010 DATE OF DISCHARGE:                              OPERATIVE REPORT   PREOPERATIVE DIAGNOSIS:  Nasal septal deviation status post prior septoplasty.  Hypertrophic inferior turbinates with obstruction.  POSTOPERATIVE DIAGNOSIS:  Nasal septal deviation status post prior septoplasty.  Hypertrophic inferior turbinates with obstruction.  PROCEDURE PERFORMED:  Revision septoplasty.  Bilateral SMR inferior turbinates.  SURGEON:  Gloris Manchester. Ezreal Turay, MD  ANESTHESIA:  General orotracheal.  BLOOD LOSS:  Less than 25 mL.  COMPLICATIONS:  None.  FINDINGS:  Thickened membranes consistent with prior surgery. Reduplication of the superior septal cartilage, especially at thechondroethmoid junction consistent with prior surgery.  Spurring along the inferior quadrangular cartilage including a tail of the quadrangular cartilage extending up the vomer.  Bulky inferior turbinates.  PROCEDURE:  The patient received his Afrin preoperative spray rather late.  Upon arriving in the operating room, in a comfortable supine position, general orotracheal anesthesia was administered without difficulty.  At an appropriate level, a saline moistened throat pack was placed.  The patient was placed in a slight sitting position.  Nasal vibrissae were trimmed.  Afrin Oxymetazoline solution was applied on 1.5 x 3 inch cottonoids to both sides of the nasal septum.  Xylocaine 1.5% with 1:200,000 epinephrine, 16 mL total was infiltrated into the anterior pole of the inferior turbinates, into the anterior floor of the nose on both sides, into the nasal spine, into the membranous columella on both sides, and finally into the submucoperichondrial plane  of the septum on both sides.  Several minutes were allowed for this to take effect.  A sterile preparation and draping of the midface was accomplished in the standard fashion.  The materials were removed from nose and observed to be intact and correct in number.  The findings were as described above.  A left-sided approach was felt indicated.  Therefore, a hemitransfixion incision was executed on the left side and carried down to a floor incision.  A right- sided floor incision was executed.  Floor tunnels were elevated on both sides and brought medially up against the vomer and brought forward along the maxillary crest.  The submucoperichondrial plane of the left septum was elevated.  There were some cuts in the septal cartilage consistent with prior surgery.  There was heavy fibrosis and reduplication of cartilage at the superior septum probably including a portion of the upper lateral cartilages and especially upon approaching the chondroethmoid junction.  The chondroethmoid junction was opened slightly forward into the septalcartilage and there was very very thin mucosa which was opened into the other side of the nose.  The mucous membrane was carefully dissected off the remaining posterior aspect of the quadrangular cartilage and carried up into the perpendicular plate superiorly.  Working inferiorly along the vomer and maxillary crest, dissection was carried along a prominent cartilaginous tail and the inferior portion of the residual perpendicular  plate.  The midportion had been removed.  Working on both sides submucosally, the cartilaginous tail of the quadrangular cartilage was dissected free and delivered.  The inferior portion of this perpendicular plate was also dissected free and delivered.  The inferior edge of the quadrangular cartilage was incised proximally 2 mm up and moderately thickened cartilage along the maxillary crest was submucosally resected.  The posterior  inferior corner of the quadrangular cartilage was submucosally resected.  The septum was freed from the chondroethmoid junction superiorly and also some bulky reduplicated cartilage along the dorsum of the quadrangular cartilage was dissected and removed.  At this point, the septum was straight intact with a slight tendency to go towards the right high superior posterior.  There was adequate support for the dorsum and the tip of the nose.  Residual cartilage and bone spicules were cleaned from the field. There was a large rent in the right septal flap and the left flap was intact.  The quadrangular cartilage was brought into the midline and secured to the nasal spine with a figure-of-eight 4-0 PDS suture.  The septum was now straight in the midline.  The septal tunnels were suctioned clear and the mucosal incisions were closed with interrupted 4-0 chromic.  The septum was quilted with 4-0 plain gut in the standard fashion.  This completed the septoplasty.  Prior to completing the septoplasty, the inferior turbinates were infiltrated with an additional 6 mL of 1.5% Xylocaine with 1:200,000 epinephrine.  After completing the septoplasty, beginning on the left side, the anterior hood of the inferior turbinate was sharply incised just behind the nasal valve.  The medial mucosa of the turbinate was incised in an anterior upsloping fashion and a laterally based flap was developed off the turbinate bone.  The turbinate was in-fractured. Using angled turbinate scissors, the turbinate bone and lateral mucosa were resected in the posterior downsloping fashion taking virtually all of the anterior pole and leaving virtually all of the posterior pole. Bony spicules were removed to allow more prompt healing.  The flap was laid back down and the turbinate was outfractured.  The bulbous posterior pole and cut mucosal edges were suction coagulated for hemostasis.  After completing the left side, the  right side was done in identical fashion.  At this point, a 0.040 Silastic splints were fashioned and placed against the septum and secured thereto with a 3-0 Ethilon stitch.  A double thickness Telfa pack impregnated with bacitracin ointment was applied along the inferior turbinate on both sides.  Two 6.5-mm nasal trumpets were shortened to reach into the nasopharynx and then were placed in the nose between the Telfa packs and the Silastic splints to allow some postoperative airway but also to assist with hemostasis. Hemostasis was observed.  At this point, the procedure was completed. The pharynx was suctioned clean and the throat pack was removed.  The patient was returned to Anesthesia, awakened, extubated, and transferred to recovery in stable condition.  COMMENT:  A 70 year old white male status post prior septoplasty many years ago with residual nasal obstruction was the indication for today's procedure.  He has obstructive sleep apnea, reflux coronary artery disease and epilepsy.  We will observe him 23 hours extended recovery in the hospital and then discharge him to his home after removal of the nasal packs.  We will emphasize ice, elevation, analgesia, and antibiosis.  We will plan to remove the septal splints in 10 days and the packs tomorrow morning.     Zola Button  Cecile Hearing, M.D.     KTW/MEDQ  D:  08/09/2010  T:  08/09/2010  Job:  [id number]  cc:   Dr. Sandra Cockayne  Electronically Signed by Flo Shanks M.D. on 08/20/2010 05:17:49 PM

## 2010-09-07 NOTE — Op Note (Signed)
NAME:  Alex [name], STIGGER NO.:  [id number]   MEDICAL RECORD NO.:  [id number]          PATIENT TYPE:  AMB   LOCATION:  DAY                           FACILITY:  [facility]   PHYSICIAN:  R. Roetta Sessions, M.D. DATE OF BIRTH:  27-May-1940   DATE OF PROCEDURE:  11/20/2006  DATE OF DISCHARGE:                               OPERATIVE REPORT   PROCEDURE:  Surveillance colonoscopy.   INDICATIONS FOR PROCEDURE:  The patient is a 70 year old Caucasian male  with history colonic adenomas.  Last colonoscopy was 2003 at which time  the colon looked good.  He has had no lower GI tract symptoms, no family  history of colon cancer.  Colonoscopy __________ approach has discussed  the patient at length.  Potential risks, benefits and alternatives have  been reviewed, questions answered.  Please see documentation in the  medical record.   PROCEDURE NOTE:  Oxygen saturation, blood pressure, pulse, and  respirations monitored throughout the entire procedure.  Conscious  sedation with Versed 3 mg IV, Demerol 75 mg IV in divided doses.  Instrument was Pentax video chip system.   FINDINGS:  Digital rectal exam revealed no abnormalities.   ENDOSCOPIC FINDINGS:  The prep was good.  Colonic mucosa was surveyed  from the rectosigmoid junction through the left, transverse, and right  colon to this appendiceal orifice, ileocecal valve and cecum.  The  structures were well seen and photographed for record.  From this level  scope was slowly withdrawn.  Previous mucosal surfaces were again seen.  The patient had left-sided transverse diverticula.  However, the colonic  mucosa otherwise appeared normal.  The scope was slowly withdrawn into  the rectum.  The rectal mucosa on  retroflex view of the anal verge  demonstrated no abnormalities.  The patient tolerated the procedure well  was reactive after endoscopy.   IMPRESSION:  1. Normal rectum.  2. Left-sided transverse diverticula.  3. Colonic mucosa  appeared normal.   RECOMMENDATIONS:  Diverticulosis literature provided to Mr. Hakimi.  Recommend repeat colonoscopy in 5 years.      Jonathon Bellows, M.D.  Electronically Signed    RMR/MEDQ  D:  11/20/2006  T:  11/20/2006  Job:  [id number]

## 2010-09-07 NOTE — Assessment & Plan Note (Signed)
**Note Alex-Identified via Obfuscation** [facility]                            CARDIOLOGY OFFICE NOTE   [name], [name]                    MRN:          [id number]  DATE:05/02/2008                            DOB:          October 20, 1940    REFERRING PHYSICIAN:  Kari Baars, MD; [facility].   CHIEF COMPLAINT:  Chest pain and shortness of breath.   CLINICAL HISTORY:  [name] is a 70 year old, who has a past history  of nonobstructive coronary artery disease.  He had catheterization in  2002, at which time he had nonobstructive disease.  He was enrolled in  the reversal trial at that time.  He has done quite well since that  time.  He has had no problems until the last couple of months, when he  developed shortness of breath with exertion and some chest tightness.  This is most always related to exertion.  He also has had some left arm  and finger pain, but this is not associated with his shortness of breath  and chest tightness.  He saw Dr. Clelia Croft recently, who arranged him to come  here for further evaluation.   PAST MEDICAL HISTORY:  Hypertension, hyperlipidemia, GERD, and seizure  disorder.  His past history is also significant for sleep apnea.   CURRENT MEDICATIONS:  1. Prilosec 40 mg daily.  2. Fluticasone.  3. Dilantin.  4. Simvastatin 80 mg daily.  5. Azor 20 mg daily.  6. Metoprolol 25 mg daily.  7. CPAP.  8. Aspirin 81 mg daily.  9. Diovan 80 mg daily.   FAMILY HISTORY:  Positive for coronary artery disease with the father,  who died at age 68 of an MI.  His mother did not have heart disease and  he has sisters who does not have heart disease.   PAST SOCIAL HISTORY:  He is working down in the [employer],  [employer].  He quit smoking in 1970s.   REVIEW OF SYSTEMS:  Positive for nasal congestion.   PHYSICAL EXAMINATION:  VITAL SIGNS:  Blood pressure is 126/81, pulse 65  and regular.  NECK:  There is no venous distension.  The carotid  pulses were full  without bruits.  CHEST:  Clear without rales or rhonchi.  CARDIAC:  Rhythm is regular.  Heart sounds were normal.  No murmurs or  gallops.  ABDOMEN:  Soft with normal bowel sounds.  There is no  hepatosplenomegaly.  EXTREMITIES:  Peripheral pulses were full.  There is no peripheral  edema.  MUSCULOSKELETAL:  No deformities.  SKIN:  Warm and dry.  NEUROLOGIC:  No focal neurological signs.   Electrocardiogram was normal.   IMPRESSION:  1. Exertional chest pain and shortness of breath, suggestive of      myocardial ischemia.  2. Left arm pain, probably not related to ischemia, more probably      related to radiculopathy.  3. History of nonobstructive disease, had catheterization in 2002.  4. Hyperlipidemia.  5. Hypertension.  6. Gastroesophageal reflux disease.  7. Seizure disorder.   RECOMMENDATIONS:  [name] was sent with a very suggestive ischemia  with  his known nonobstructive coronary artery disease.  I think he  should be evaluated further.  We will plan to arrange for him to have an  adenosine rest/stress Myoview scan.  He also asked about whether his  cholesterol was good enough on the simvastatin.  We will get his  laboratory studies from Dr. Alver Fisher and see.  I will plan to be in touch  with him by phone after the results of his Myoview scan.     Bruce Elvera Lennox Juanda Chance, MD, [facility]  Electronically Signed    BRB/MedQ  DD: 05/02/2008  DT: 05/03/2008  Job #: [id number]

## 2010-09-07 NOTE — Assessment & Plan Note (Signed)
[facility]                            CARDIOLOGY OFFICE NOTE   [name], [name]                    MRN:          [id number]  DATE:05/29/2008                            DOB:          1940-06-11    PRIMARY CARE PHYSICIAN:  Dr. Kari Baars, [facility].   CLINICAL HISTORY:  [name] is a 70 year old and returned for a  followup visit after his recent catheterization.  He had been  catheterized in 2002 for chest pain and found to have nonobstructive  disease and participated in the REVERSAL trial.  I saw him recently on  referral from Dr. Clelia Croft because of symptoms of shortness of breath with  exertion.  We felt this may be an anginal equivalent.  We did a Myoview  scan which suggested inferior ischemia.  He underwent catheterization,  but he had only nonobstructive disease with very little progression from  his previous catheterization.   He had done fine since his cath and has had no recurrent symptoms.  We  thought his shortness of breath was primarily related to deconditioning  and I encouraged him to exercise more.   PAST MEDICAL HISTORY:  Hypertension, hyperlipidemia, GERD, and seizure  disorder.  He also has a history for sleep apnea.   CURRENT MEDICATIONS:  Prilosec, calcium, hydrochlorothiazide, aspirin,  CPAP, Hyzaar, simvastatin, and Dilantin.   PHYSICAL EXAMINATION:  VITAL SIGNS:  The blood pressure was 109/69 and  the pulse 68 and regular.  LUNGS:  There is no venous tension.  The carotid pulses were full  without bruits.  CHEST:  Clear.  HEART:  Rhythm was regular.  No murmurs or gallops.  ABDOMEN:  Soft.  No organomegaly.  EXTREMITIES:  Peripheral pulses were full with no peripheral edema.  The  right femoral artery site was well healed.   IMPRESSION:  1. Nonobstructive coronary disease.  2. Hypertension.  3. Hyperlipidemia.  4. Seizure disorder.  5. Gastroesophageal reflux disease.   RECOMMENDATIONS:  Mr.  Alex [name] has only nonobstructive coronary disease.  I do not think his recent symptoms are related to ischemia.  He is  interested in optimizing his treatment prevent future cardiac events.  He was switched from Lipitor to simvastatin at the request of his  insurance company, but his LDL was not at target when he was on the  simvastatin.  His LDL was 96 in October by Dr. Clelia Croft.  I think to  minimize progression and minimize the risk of clinical events.  We  should put him back on Lipitor with a goal of getting his LDL down below  80.  We  will start him on 40 and we will get a lipid profile in 6 weeks.  I will  turn him back over to Dr. Clelia Croft after that for followup care and we will  plan to see him back on a p.r.n. basis.     Bruce Elvera Lennox Juanda Chance, MD, [facility]  Electronically Signed    BRB/MedQ  DD: 05/29/2008  DT: 05/30/2008  Job #: [id number]

## 2010-09-07 NOTE — Cardiovascular Report (Signed)
NAME:  Alex [name], WHETZEL NO.:  [id number]   MEDICAL RECORD NO.:  [id number]          PATIENT TYPE:  OIB   LOCATION:  1965                         FACILITY:  [facility]   PHYSICIAN:  Bruce R. Juanda Chance, MD, FACCDATE OF BIRTH:  Jul 18, 1940   DATE OF PROCEDURE:  05/16/2008  DATE OF DISCHARGE:  05/16/2008                            CARDIAC CATHETERIZATION   CLINICAL HISTORY:  Mr. Martie Round is 70 years old and underwent  catheterization in 2002 for chest pain.  At that time, he had  nonobstructive disease and he was enrolled in the reversal trial.  He  has done well since that time but over the past 2-3 months, he has  developed increased shortness of breath with exertion.  I saw him in  consultation recently and we were concerned this might represent an  anginal equivalent.  We did a Myoview scan, which suggested possible  inferior ischemia.  We brought him in today for catheterization.   PROCEDURE:  The procedure was performed via the femoral arterial sheath,  an arterial sheath, and 4-French preformed coronary catheters.  A front  wall arterial puncture was performed and Omnipaque contrast was used.  The patient tolerated the procedure well and left the laboratory in  satisfactory condition.   RESULTS:  The left main coronary artery was free of significant disease.   Left anterior descending artery gave rise to a septal perforator, a  moderate-sized diagonal branch, and a smaller diagonal branch.  There  was 30% proximal narrowing before the septal perforator.  There was 50%  narrowing in the proximal midvessel after the diagonal branch.  There  was a long 50% narrowing in the mid-to-distal vessel.  First diagonal  branch had a long 50% ostial stenosis.   The circumflex artery gave rise to a marginal branch and a small AV  branch.  This vessel is irregular, but there is no significant  obstruction.  This appeared slightly better than description on the  previous  angiogram.   The right coronary artery is a moderate-sized vessel, gave rise to a  conus branch, right ventricular branch, posterior descending branch, and  posterolateral branch.  This vessel was irregular, but there is no  significant obstruction.   The left ventriculogram was performed in the RAO projection shows good  wall motion with no areas of hypokinesis.  The estimated ejection  fraction was 60%.   The aortic pressure was 98/59 with a mean of 74.  Left ventricular  pressure was 98/9.   CONCLUSION:  Nonobstructive coronary artery disease with 30% proximal,  50% proximal mid, and 50% mid distal stenosis in the LAD with 50%  stenosis in the first diagonal branch, irregularities in the circumflex  artery and right coronary and normal LV function.   RECOMMENDATIONS:  The patient has only nonobstructive disease, and there  does not appear to be any source of ischemia.  The plaque in the mid and  the distal LAD appears to be may be slightly more than the previous  description and a plaque in the circumflex artery is slightly less, and  overall, it does not look like  there has been any major progression of  disease.  We will plan reassurance.  The etiology of shortness breath is  not clear, may related to deconditioning.  He had been a smoker but quit  in 8 and has no clinical evidence of pulmonary disease.      Bruce Elvera Lennox Juanda Chance, MD, [facility]  Electronically Signed     BRB/MEDQ  D:  05/16/2008  T:  05/16/2008  Job:  [id number]   cc:   Kari Baars, M.D.

## 2010-09-10 NOTE — Cardiovascular Report (Signed)
[place]  Patient:    Alex [name]                     MRN: [id number] Proc. Date: 05/21/99 Adm. Date:  [id number] Disc. Date: [id number] Attending:  Lenoria Farrier CC:         Kari Baars, M.D.             Bruce Elvera Lennox Juanda Chance, M.D. [facility]             Cardiac Catheterization Laboratory                        Cardiac Catheterization  CLINICAL HISTORY:  [name] is 70 years old and has a previous history of mild coronary artery disease at catheterization in 1994.  He was recently seen by me in the office, with symptoms of chest pain which were felt to be somewhat atypical for ischemia and palpitations.  We arranged for him to have a stress Cardiolite scan and this suggested inferior ischemia and Dr. Ladona Ridgel, who was in the office as doctor of the day, arranged for him to come in for catheterization.  He also was scheduled for an event monitor and I do not have those results.  He was scheduled for a lipid profile which showed a moderately elevated LDL.  DESCRIPTION OF PROCEDURE:  The procedure was performed via the right femoral artery using arterial sheath and 6-French preformed coronary catheter.  After completion, a distal aortogram was performed to rule out abdominal aortic aneurysm.  After completion of the diagnostic study, we made preparations to do an IVUS measurement for the REVERSAL trial.  The patient was given weight adjusted heparin to prolong the ACT to greater than 200 seconds.  We used a 7-French JL-4 guiding catheter nd a 3.2 Ultra-Cross.  We advanced the Ultra-Cross down the circumflex marginal vessel without problems and did an automatic pullback.  Nitroglycerin was given before the ultrasound catheter was passed into the marginal vessel.  The patient tolerated the procedure well.  We closed the right femoral artery with a Perclose at the end f the procedure.  RESULTS:  CORONARY ANGIOGRAPHY: 1. The left  main coronary artery was a short vessel that was free of significant    disease.  2. The left anterior descending artery was diffusely irregular with a 30% proximal    and tandem 50% stenoses in the mid portion.  There were two diagonal branches    and two septal perforators which were free of significant disease.  3. The circumflex artery gave rise to a small intermediate branch, a large marginal    branch, and a small AV branch.  There was 50% ostial stenosis of the    intermediate branch.  There was 30% narrowing in the proximal circumflex before    the marginal branch.  4. The right coronary artery is a large dominant vessel that gave rise to a conus    branch, a small right ventricular branch, a posterior descending branch, and two    posterolateral branches.  This vessel is irregular, with no significant    obstruction.  LEFT VENTRICULOGRAM:  The left ventriculogram was performed in the RAO projection and showed good wall motion with no areas of hypokinesis.  The estimated ejection fraction was 60%.  DISTAL AORTOGRAM:  A distal aortogram was performed which showed no renal artery stenosis and no significant aortoiliac obstruction.  IVUS:  The IVUS  run showed moderate plaque, which was mostly focally located in the proximal circumflex artery and constituted perhaps 30% to 40% of the lumen.  CONCLUSIONS: 1. Moderate nonobstructive coronary artery disease with 30% proximal and 50% mid    stenosis of the left anterior descending artery, 50% stenosis in the    intermediate branch of the circumflex artery, with 30% stenosis in the proximal    circumflex artery, and irregularities in the right coronary artery. 2. Normal left ventricular function.  RECOMMENDATIONS:  In view of these findings, I think the recent Cardiolite scan is probably a false positive scan.  He does have moderate plaque and we have enrolled him into the REVERSAL trial, which hopefully will  stabilize his situation.  We ill still wait on the event monitor to see if he needs any treatment for cardiac arrhythmias.DD:  05/21/99 TD:  05/23/99 Job: [id number]

## 2010-09-10 NOTE — Op Note (Signed)
[facility]  Patient:    Alex [name], Alex [name] Visit Number: [id number] MRN: [id number]          Service Type: END Location: DAY Attending Physician:  Jonathon Bellows Dictated by:   Roetta Sessions, M.D. Proc. Date: 09/07/01 Admit Date:  09/07/2001 Discharge Date: 09/07/2001   CC:         Kari Baars, M.D.   Operative Report  PROCEDURE:  Surveillance colonoscopy.  INDICATIONS FOR PROCEDURE:  The patient is a 70 year old gentleman with a history of colonic adenomatous polyps who comes for a surveillance examination. He has done well except for vague intermittent right lower quadrant abdominal pain. A colonoscopy is now being done with a standard screening maneuver and in part to evaluate the right colon. This approach has been discussed with [name] previously. The potential risks, benefits, and alternatives have been reviewed, questions answered. He is agreeable. Please see my dictated H&P for more information.  MONITORING:  O2 saturations, blood pressure, pulse and respirations were monitored throughout the entire procedure.  CONSCIOUS SEDATION:  Versed 3 mg IV, Demerol 50 IV in divided doses.  INSTRUMENT:  Olympus video chip colonoscope.  FINDINGS:  Digital rectal exam revealed no abnormalities.  ENDOSCOPIC FINDINGS:  The prep was good.  RECTUM:  Examination of the rectal mucosal including retroflexed view of the anal verge revealed minimal internal hemorrhoids otherwise rectal mucosa appeared normal.  COLON:  The colonic mucosa was surveyed from the rectosigmoid junction through the left transverse right colon to the area of the appendiceal orifice, ileocecal valve, and cecum. These structures were well seen and photographed. The patient had scattered left sided diverticula. The remainder of the colonic mucosa appeared normal. From the level of the cecum and ileocecal valve (see photos), the scope was slowly and cautiously withdrawn.  All previously mentioned mucosal surfaces were again seen and no abnormalities were observed. I briefly intended to intubate the terminal ileum but was unable to do so. T  The patient tolerated the procedure well and was reacted in endoscopy.  IMPRESSION:  1. Minimal internal hemorrhoids otherwise normal rectum.  2. Left sided diverticula, remainder of colonic mucosa appeared normal. No     explanation for right lower quadrant abdominal pain on todays exam.  RECOMMENDATIONS:  Will proceed with a CT of the abdomen and pelvis. As far as surveillance goes, [name] should return for repeat colonoscopy in 5 years. Dictated by:   Roetta Sessions, M.D. Attending Physician:  Jonathon Bellows DD:  09/07/01 TD:  09/10/01 Job: [id number]

## 2010-09-10 NOTE — Cardiovascular Report (Signed)
[place]  Patient:    Alex [name], Alex [name] Visit Number: [id number] MRN: [id number]          Service Type: Attending:  Everardo Beals. Juanda Chance, M.D. [facility] Dictated by:   Everardo Beals. Juanda Chance, M.D. [facility] Proc. Date: 12/29/00   CC:         Kari Baars, M.D.  Cardiac Catheterization Laboratory   Cardiac Catheterization  CLINICAL HISTORY:  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  He used to work for [employer].  Eighteen months ago, he had a catheterization for evaluation of chest pain and abnormal Cardiolite, which showed only nonobstructive coronary disease.  He underwent IVUS measurements as part of the REVERSAL trial and has been on the REVERSAL trial for the past 18 months and now returns for followup catheterization.  He has had no recent symptoms.  PROCEDURE:  The procedure was performed via the right femoral artery using arterial sheath and 6-French preformed coronary catheters.  A front wall arterial puncture was performed and Omnipaque contrast was used.  We used a JL-4 7-French guiding catheter for the left coronary angiography.  After giving nitroglycerin, we passed a luge wire down the circumflex marginal vessel.  Using a 3.2 UltraCross, we passed the UltraCross down to the distal portion of the vessel and did automatic pullback.  Repeat diagnostic studies were then performed through the guiding catheter.  The patient tolerated the procedure well and left the laboratory in satisfactory condition.  RESULTS:  The left main coronary artery is free of significant disease.  The left anterior descending artery was irregular and there was a 40% proximal narrowing before the first septal perforator and there was a 50% narrowing in the proximal to mid vessel located just after the first large diagonal branch. The LAD again gave rise to another diagonal branch.  There were no other major obstructions.  The circumflex artery gave  rise to a small intermediate branch, a large marginal branch, and a small AV branch.  There was 50% ostial narrowing in the intermediate branch and 30% proximal narrowing in the circumflex artery and 30% narrowing in the mid marginal branch.  The right coronary artery is a dominant vessel that gave rise to conus branch, a right ventricular branch, a posterior descending branch, and two posterolateral branches.  The vessel was slightly irregular and there was no significant obstruction.  LEFT VENTRICULOGRAM:  The left ventriculogram was performed in the RAO projection and showed good wall motion with no areas of hypokinesis.  The ejection fraction was calculated at 69%.  The IVUS measurements in the circumflex artery showed minimal plaque in the mid marginal vessel with a vessel diameter of 3.5 to 4.0.  In the very proximal vessel, the vessel size was greater than 4.0 and the maximum plaque area covered about 40% of the vessel area.  CONCLUSIONS: 1. Nonobstructive coronary artery disease with 40% proximal and 50% mid    narrowing in the left anterior descending artery, 30% proximal narrowing    in the circumflex artery, with 30% narrowing in the mid portion of the    marginal branch.  No major obstruction in the right coronary artery. 2. Normal left ventricular function.  RECOMMENDATIONS:  The obstructive coronary disease appears slightly better by my previous description, although I did not have the old films available for direct comparison.  There does not certainly appear to be any progression of disease.  We will plan continued management with  statin therapy and followup with Dr. Juanetta Gosling. Dictated by:   Everardo Beals Juanda Chance, M.D. [facility] Attending:  Everardo Beals Juanda Chance, M.D. [facility] DD:  12/29/00 TD:  12/29/00 Job: [id number]

## 2010-09-10 NOTE — Procedures (Signed)
NAME:  Alex [name], Alex [name]          ACCOUNT NO.:  [id number]   MEDICAL RECORD NO.:  [id number]          PATIENT TYPE:  OUT   LOCATION:  SLEEP CENTER                 FACILITY[facility]  [facility]   PHYSICIAN:  Clinton D. Maple Hudson, M.D. DATE OF BIRTH:  05-20-1940   DATE OF STUDY:  03/18/2005                              NOCTURNAL POLYSOMNOGRAM   REFERRING PHYSICIAN:  Dr. Flo Shanks.   DATE OF STUDY:  March 18, 2005.   INDICATION FOR STUDY:  Hypersomnia with sleep apnea. Epworth sleepiness  score 12/24, BMI 30, weight 225 pounds.   SLEEP ARCHITECTURE:  Total sleep time 315 minutes with sleep efficiency 73%.  Stage I was 4%, stage II 73%, stages III and IV were absent, REM 22% of  total sleep time. Sleep latency 14 minutes, REM latency 59 minutes, awake  after sleep onset 101 minutes, arousal index 24. No bedtime medication  taken.   RESPIRATORY DATA:  Split study protocol. Apnea hypopnea index (AHI, RDI) 17  obstructive events per hour indicating mild to moderate obstructive sleep  apnea/hypopnea syndrome before CPAP. This included 26 obstructive apneas and  22 hypopneas. Events were not positional, significantly frequent while  supine and on left side more than right side. REM AHI 23 per hour. CPAP was  titrated initially to 6 CWP for an AHI of 4.4 per hour which would have been  considered satisfactory. However the patient removed the mask complaining of  difficulty exhaling. The technician then changed to BiPAP with an  inspiratory pressure of 8 and expiratory pressure of 5. The patient again  removed his mask complaining of feeling suffocated while lying on his left  side and supine. The patient then asked the study be continued as a  diagnostic NPSG without CPAP which was done. A medium ResMed Ultra Mirage  nasal/oral mask was used with heated humidifier.   OXYGEN DATA:  Very loud snoring with oxygen desaturation to a nadir of 86%  before CPAP. CPAP and BiPAP seemed to provide some  stabilization of oxygen  saturation around 93-94% on room air.   CARDIAC DATA:  Sinus bradycardia at 55-60 beats per minute.   MOVEMENT/PARASOMNIA:  A total of 157 limb jerks were recorded of which 30  were associated with arousal or awakening for a periodic limb movement with  arousal index of 5.7 per hour which is mildly elevated.   IMPRESSION/RECOMMENDATION:  1.  Mild to moderate obstructive sleep apnea/hypopnea syndrome, apnea      hypopnea index 17 per hour with events most prominent while supine and      on left side. Very loud snoring with oxygen desaturation to 86%.  2.  Effective continuous positive airway pressure control at 6 CWP, AHI 4.4      per hour. However the patient complained of difficulty exhaling and did      not do better with a trial of bilevel positive airway pressure      inspiratory 8/expiratory 5 per protocol. He may be more comfortable with      C-Flex technology. Arrangement      can be made with Daphine Deutscher, Sleep Center Director, for daytime      continuous positive  airway pressure desensitization which may also help.      Clinton D. Maple Hudson, M.D.  Diplomate, Biomedical engineer of Sleep Medicine  Electronically Signed     CDY/MEDQ  D:  03/27/2005 08:16:19  T:  03/27/2005 09:26:31  Job:  [id number]

## 2010-11-10 ENCOUNTER — Ambulatory Visit (INDEPENDENT_AMBULATORY_CARE_PROVIDER_SITE_OTHER): Payer: MEDICARE | Admitting: General Surgery

## 2010-11-10 ENCOUNTER — Encounter (INDEPENDENT_AMBULATORY_CARE_PROVIDER_SITE_OTHER): Payer: Self-pay | Admitting: General Surgery

## 2010-11-10 VITALS — BP 132/78 | HR 64 | Temp 96.2°F | Ht 72.0 in | Wt 227.0 lb

## 2010-11-10 DIAGNOSIS — K429 Umbilical hernia without obstruction or gangrene: Secondary | ICD-10-CM | POA: Insufficient documentation

## 2010-11-10 NOTE — Progress Notes (Signed)
Alex Cola. is a 70 y.o. male.    Chief Complaint  Patient presents with  . Other    Umbilical hernia    HPI HPI This is a 70 year old male who presents with umbilical bulge that was noted about a month ago. He was feeling his stomach and noticed a lump just above his umbilicus. This has caused him no complaints at all. He has no symptoms referable to this. He came in to have this evaluated as he does bother him that there is this lump present. He has a prior history of colonoscopies which have not been abnormal. He has no changes in his bowel movements or any nausea or vomiting associated with this. It does go away when he lies down. He has no history of abdominal surgery.  Past Medical History  Diagnosis Date  . Colon polyp   . Hypertension   . Hyperlipidemia   . Arthritis   . Hearing loss   . MRSA (methicillin resistant staph aureus) culture positive   . CAD (coronary artery disease)   . Hiatal hernia   . GERD (gastroesophageal reflux disease)   . Seizures     last in 1970s    Past Surgical History  Procedure Date  . Carpal tunnel release 1994    left  . Knee surgery 1996    left - scope  . Spine surgery     C2, C3, C4  . Nose surgery     twice  . Rotator cuff repair     twice, both shoulders  . Foot surgery     right -twice, left foot once  . Trigger finger release     both hands, twice    Family History  Problem Relation Age of Onset  . Diabetes Father   . Heart disease Father     Social History History  Substance Use Topics  . Smoking status: Never Smoker   . Smokeless tobacco: Not on file  . Alcohol Use: Yes     socially    Allergies  Allergen Reactions  . Altace Other (See Comments)    Causes patient cough    Current Outpatient Prescriptions  Medication Sig Dispense Refill  . amLODipine (NORVASC) 10 MG tablet Take 10 mg by mouth daily.        [name] Kitchen aspirin 81 MG tablet Take 81 mg by mouth daily.        [name] Kitchen atorvastatin (LIPITOR) 80 MG  tablet Take 80 mg by mouth daily.        . fish oil-omega-3 fatty acids 1000 MG capsule Take 2 g by mouth daily.        [name] Kitchen losartan (COZAAR) 100 MG tablet Take 100 mg by mouth daily.        . Multiple Vitamin (MULTIVITAMIN) tablet Take 1 tablet by mouth daily.        [name] Kitchen omeprazole (PRILOSEC) 20 MG capsule Take 20 mg by mouth daily.        . phenytoin (DILANTIN) 100 MG ER capsule Take 100 mg by mouth 2 (two) times daily.          Review of Systems Review of Systems  Musculoskeletal: Positive for myalgias.  All other systems reviewed and are negative.    Physical Exam Physical Exam  Constitutional: He appears well-developed and well-nourished.  HENT:  Head: Normocephalic and atraumatic.  Eyes: No scleral icterus.  Neck: Neck supple.  Cardiovascular: Normal rate, regular rhythm and normal heart sounds.   Respiratory:  Effort normal and breath sounds normal. He has no wheezes. He has no rales.  GI: Soft. There is no tenderness. A hernia (reducible umbilical hernia nontender) is present.  Lymphadenopathy:    He has no cervical adenopathy.     Blood pressure 132/78, pulse 64, temperature 96.2 F (35.7 C), temperature source Temporal, height 6' (1.829 m), weight 227 lb (102.967 kg).  Assessment/Plan  Umbilical hernia  We discussed the etiology of an umbilical hernia. I discussed with him the options of observation versus repair. We discussed the risks of observation. He would like to have this area repaired. We discussed weight loss. I discussed the risks being that of limited to bleeding, infection, seroma, and recurrence of this hernia. I did discuss that I would repair this with mesh. This is mostly due his body habitus and what feels like the size of this hernia. He would like to have this done in October 19 to be perfectly reasonable. I've asked him to call me sooner if he has any other problems or complaints.  Elani Delph 11/10/2010, 2:53 PM

## 2011-01-05 ENCOUNTER — Encounter: Payer: Self-pay | Admitting: Cardiovascular Disease

## 2011-01-06 ENCOUNTER — Encounter: Payer: Self-pay | Admitting: Cardiovascular Disease

## 2011-01-06 ENCOUNTER — Ambulatory Visit (INDEPENDENT_AMBULATORY_CARE_PROVIDER_SITE_OTHER): Payer: Medicare Other | Admitting: Cardiovascular Disease

## 2011-01-06 VITALS — BP 143/87 | HR 87 | Ht 72.0 in | Wt 228.0 lb

## 2011-01-06 DIAGNOSIS — I2510 Atherosclerotic heart disease of native coronary artery without angina pectoris: Secondary | ICD-10-CM

## 2011-01-06 MED ORDER — HYDROCHLOROTHIAZIDE 12.5 MG PO CAPS: 12.5000 mg | ORAL_CAPSULE | Freq: Every day | ORAL | Status: DC

## 2011-01-06 NOTE — Assessment & Plan Note (Signed)
On statin. Followed by Dr. Clelia Croft.

## 2011-01-06 NOTE — Patient Instructions (Signed)
Your physician has recommended you make the following change in your medication:  1) Restart Hydrochlorothiazide (HCTZ) 12.5mg  once daily.  Your physician wants you to follow-up in: 1 year. You will receive a reminder letter in the mail two months in advance. If you don't receive a letter, please call our office to schedule the follow-up appointment.

## 2011-01-06 NOTE — Progress Notes (Signed)
History of Present Illness:69 years male with history of HTN, non-obstructive CAD, last cath 2011, GERD and hyperlipidemia with mild carotid artery disease who is here today for follow up. He has been followed in the past by Dr. Juanda Chance for his cardiac issues. He had a  workup 2011 for dizziness including carotid artery dopplers (mild bilateral disease, <39%) and normal echo. HCTZ and Coreg was stopped which helped his dizziness. I saw him last in March 2012  Dr. Clelia Croft is his primary care physician.   Occasional sharp sticking chest pains that last for a few seconds. No associated  SOB, diaphoresis, n/v. His dizziness has improved since he stopped the HCTZ and Coreg. Overall doing well. He has plans for hernia surgery with Dr. Dwain Sarna later this month.   Past Medical History  Diagnosis Date  . Diverticulosis of colon   . Coronary artery disease     moderate disease by cath 2011  . Seizure disorder   . GERD (gastroesophageal reflux disease)   . Hypertension   . Hyperlipidemia     Past Surgical History  Procedure Date  . Compression fracture   . C-spine fusion   . Vasectomy   . Rotator cuff repair   . Nasal septum surgery   . Trigger finger release   . Carpal tunnel release     left  . Knee arthroscopy     left knee  . Bunionectomy     Current Outpatient Prescriptions  Medication Sig Dispense Refill  . amLODipine (NORVASC) 10 MG tablet Take 1 tablet by mouth Daily.      [name] Kitchen aspirin 81 MG tablet Take 81 mg by mouth daily.        . Calcium Carbonate (CALCIUM 600 PO) Take by mouth.        . losartan (COZAAR) 100 MG tablet Take 1 tablet by mouth Daily.      . Multiple Vitamin (MULTIVITAMIN PO) Take by mouth.        . nitroGLYCERIN (NITROSTAT) 0.4 MG SL tablet Place 0.4 mg under the tongue every 5 (five) minutes as needed.        . Omega-3 Fatty Acids (FISH OIL) 1000 MG CAPS Take by mouth 2 (two) times daily.       [name] Kitchen omeprazole (PRILOSEC) 20 MG capsule Take 20 mg by mouth daily.         . phenytoin (DILANTIN) 100 MG ER capsule Take 400 mg by mouth 2 (two) times daily.        . rosuvastatin (CRESTOR) 40 MG tablet Take 40 mg by mouth daily.          Allergies  Allergen Reactions  . Ramipril     REACTION: causes cough    History   Social History  . Marital Status: Married    Spouse Name: N/A    Number of Children: N/A  . Years of Education: N/A   Occupational History  . Not on file.   Social History Main Topics  . Smoking status: Former Smoker    Quit date: 04/25/1969  . Smokeless tobacco: Not on file  . Alcohol Use: Yes  . Drug Use: No  . Sexually Active:    Other Topics Concern  . Not on file   Social History Narrative  . No narrative on file    Family History  Problem Relation Age of Onset  . Hyperlipidemia    . Diabetes    . Hypertension    . Seizures    .  Thyroid disease    . Heart attack Father 87    Review of Systems:  As stated in the HPI and otherwise negative.   BP 143/87  Pulse 87  Ht 6' (1.829 m)  Wt 228 lb (103.42 kg)  BMI 30.92 kg/m2  Physical Examination: General: Well developed, well nourished, NAD HEENT: OP clear, mucus membranes moist SKIN: warm, dry. No rashes. Neuro: No focal deficits Musculoskeletal: Muscle strength 5/5 all ext Psychiatric: Mood and affect normal Neck: No JVD, no carotid bruits, no thyromegaly, no lymphadenopathy. Lungs:Clear bilaterally, no wheezes, rhonci, crackles Cardiovascular: Regular rate and rhythm. Soft systolic  Murmur. No  gallops or rubs. Abdomen:Soft. Bowel sounds present. Non-tender.  Extremities: No lower extremity edema. Pulses are 2 + in the bilateral DP/PT.

## 2011-01-06 NOTE — Assessment & Plan Note (Signed)
BP elevated. Will restart HCTZ 12. 5 mg po Qdaily.

## 2011-01-06 NOTE — Assessment & Plan Note (Signed)
Mild disease by cath 2011. Atypical, occasional chest pain, non-cardiac. No changes.

## 2011-01-17 ENCOUNTER — Encounter: Payer: Self-pay | Admitting: Cardiovascular Disease

## 2011-01-18 ENCOUNTER — Encounter (HOSPITAL_BASED_OUTPATIENT_CLINIC_OR_DEPARTMENT_OTHER)
Admission: RE | Admit: 2011-01-18 | Discharge: 2011-01-18 | Disposition: A | Discharge status: Home / Self Care | Payer: Medicare Other | Source: Ambulatory Visit | Attending: General Surgery | Admitting: General Surgery

## 2011-01-18 ENCOUNTER — Other Ambulatory Visit (INDEPENDENT_AMBULATORY_CARE_PROVIDER_SITE_OTHER): Payer: Self-pay | Admitting: General Surgery

## 2011-01-18 ENCOUNTER — Ambulatory Visit
Admission: RE | Admit: 2011-01-18 | Discharge: 2011-01-18 | Disposition: A | Discharge status: Home / Self Care | Payer: Medicare Other | Source: Ambulatory Visit | Attending: General Surgery | Admitting: General Surgery

## 2011-01-18 DIAGNOSIS — Z01811 Encounter for preprocedural respiratory examination: Secondary | ICD-10-CM

## 2011-01-18 LAB — DIFFERENTIAL
Lymphs Abs: 2.6 10*3/uL (ref 0.7–4.0)
Monocytes Relative: 9 % (ref 3–12)
Neutro Abs: 3.7 10*3/uL (ref 1.7–7.7)
Neutrophils Relative %: 49 % (ref 43–77)

## 2011-01-18 LAB — BASIC METABOLIC PANEL
CO2: 25 mEq/L (ref 19–32)
Calcium: 9.2 mg/dL (ref 8.4–10.5)
Creatinine, Ser: 0.73 mg/dL (ref 0.50–1.35)
Glucose, Bld: 106 mg/dL — ABNORMAL HIGH (ref 70–99)
Sodium: 140 mEq/L (ref 135–145)

## 2011-01-18 LAB — CBC
Hemoglobin: 16.4 g/dL (ref 13.0–17.0)
MCH: 32 pg (ref 26.0–34.0)
MCV: 87.3 fL (ref 78.0–100.0)
RBC: 5.13 MIL/uL (ref 4.22–5.81)

## 2011-01-19 ENCOUNTER — Ambulatory Visit (HOSPITAL_BASED_OUTPATIENT_CLINIC_OR_DEPARTMENT_OTHER)
Admission: RE | Admit: 2011-01-19 | Discharge: 2011-01-19 | Disposition: A | Discharge status: Home / Self Care | Payer: Medicare Other | Source: Ambulatory Visit | Attending: General Surgery | Admitting: General Surgery

## 2011-01-19 DIAGNOSIS — G4733 Obstructive sleep apnea (adult) (pediatric): Secondary | ICD-10-CM | POA: Insufficient documentation

## 2011-01-19 DIAGNOSIS — K429 Umbilical hernia without obstruction or gangrene: Secondary | ICD-10-CM

## 2011-01-19 DIAGNOSIS — I10 Essential (primary) hypertension: Secondary | ICD-10-CM | POA: Insufficient documentation

## 2011-01-19 DIAGNOSIS — Z01812 Encounter for preprocedural laboratory examination: Secondary | ICD-10-CM | POA: Insufficient documentation

## 2011-01-19 DIAGNOSIS — K219 Gastro-esophageal reflux disease without esophagitis: Secondary | ICD-10-CM | POA: Insufficient documentation

## 2011-01-19 HISTORY — PX: UMBILICAL HERNIA REPAIR: SHX196

## 2011-01-19 NOTE — Op Note (Signed)
  NAME:  Alex [name], Alex [name] NO.:  [id number]  MEDICAL RECORD NO.:  [id number]  LOCATION:                                 FACILITY:  PHYSICIAN:  Juanetta Gosling, MDDATE OF BIRTH:  [birth year]  DATE OF PROCEDURE:  01/19/2011 DATE OF DISCHARGE:                              OPERATIVE REPORT   PREOPERATIVE DIAGNOSIS:  Symptomatic umbilical hernia.  POSTOPERATIVE DIAGNOSIS:  Umbilical hernia.  PROCEDURE:  Umbilical hernia repair with Proceed ventral patch, 6.4 cm.  SURGEON:  Juanetta Gosling, MD  ASSISTANT:  None.  SUPERVISING ANESTHESIOLOGIST:  Dr. Jairo Ben.  ANESTHESIA:  General with LMA.  SPECIMENS:  None.  DRAINS:  None.  COMPLICATIONS:  None.  DISPOSITION:  The patient to recovery room in stable condition.  INDICATIONS:  This is a 70 year old male who had an umbilical bulge noted recently that has been fairly asymptomatic.  He asked that this be evaluated.  I discussed with him multiple options, and we elected to have this area repaired.  We discussed the risks and benefits associated with umbilical hernia repair with mesh.  PROCEDURE:  After informed consent was obtained, the patient was taken to the operating room.  He was administered 1 g of intravenous cefazolin.  Sequential compression devices were placed on his lower extremities prior to induction with anesthesia.  He was then placed under general anesthesia with LMA without complication.  His abdomen was then prepped and draped in a standard sterile surgical fashion.  A surgical time-out was then performed.  I infiltrated 0.25% Marcaine throughout the area around his umbilicus. I then made a curvilinear incision below his umbilicus.  I carried this down to the umbilical stalk.  This was encircled with a Kelly clamp, I then divided this with electrocautery.  He was then noted to have two small hernias separated by about 8 mm of some thinned out fascia.  The hernias were both  easily reducible and I thought about just placing stitches in those but due to the fact that the tissue in between those was not normal, I elected to incise the tissue bridge between the two hernias and repair this with a 6.4-cm Proceed ventral patch.  This was placed in the preperitoneal space and was laid out flat.  I then sutured the ends to the edge of the fascia and then tacked them down, also closed the fascia overlying this with 0 Ethibond sutures as well.  This repaired the hernia adequately.  Hemostasis was observed.  I then tacked the umbilicus down with a 3-0 undyed Vicryl suture.  I then closed the dermis with 3-0 Vicryl and the skin with 4-0 Monocryl in a subcuticular fashion.  Steri-Strips and sterile dressing were placed.  He tolerated this well, was extubated in the operating room, and transferred to the recovery room in stable condition.     Juanetta Gosling, MD     MCW/MEDQ  D:  01/19/2011  T:  01/19/2011  Job:  [id number]  cc:   Kari Baars, M.D.  Electronically Signed by Emelia Loron MD on 01/19/2011 02:10:57 PM

## 2011-02-07 ENCOUNTER — Ambulatory Visit (INDEPENDENT_AMBULATORY_CARE_PROVIDER_SITE_OTHER): Payer: Medicare Other | Admitting: General Surgery

## 2011-02-07 ENCOUNTER — Encounter (INDEPENDENT_AMBULATORY_CARE_PROVIDER_SITE_OTHER): Payer: Self-pay | Admitting: General Surgery

## 2011-02-07 VITALS — BP 118/70 | HR 68 | Temp 97.6°F | Resp 14 | Ht 72.0 in | Wt 225.4 lb

## 2011-02-07 DIAGNOSIS — Z09 Encounter for follow-up examination after completed treatment for conditions other than malignant neoplasm: Secondary | ICD-10-CM

## 2011-02-07 NOTE — Progress Notes (Signed)
Subjective:     Patient ID: Alex [name]., male   DOB: 1941-02-08, 70 y.o.   MRN: [id number]  HPI This is a 70 year old male who presented with an umbilical hernia. He underwent an umbilical hernia repair with proceed ventral patch almost 3 weeks ago now. He doing well without any significant complaints.  Review of Systems     Objective:   Physical Exam Well healed incision with mild seroma    Assessment:     S/p UH repair    Plan:     I told him he can return to full activity at 3 weeks postoperatively. He is going to return to see me as needed.

## 2011-03-10 ENCOUNTER — Other Ambulatory Visit: Payer: Self-pay | Admitting: Cardiovascular Disease

## 2011-03-10 DIAGNOSIS — I6529 Occlusion and stenosis of unspecified carotid artery: Secondary | ICD-10-CM

## 2011-03-11 ENCOUNTER — Encounter (INDEPENDENT_AMBULATORY_CARE_PROVIDER_SITE_OTHER): Payer: Medicare Other | Admitting: *Deleted

## 2011-03-11 DIAGNOSIS — I6529 Occlusion and stenosis of unspecified carotid artery: Secondary | ICD-10-CM

## 2011-03-15 ENCOUNTER — Telehealth: Payer: Self-pay | Admitting: Cardiovascular Disease

## 2011-03-15 NOTE — Telephone Encounter (Signed)
Fu call °Pt returning your call  °

## 2011-03-15 NOTE — Telephone Encounter (Signed)
Spoke with pt and reviewed carotid doppler study results with him.

## 2011-11-08 ENCOUNTER — Encounter: Payer: Self-pay | Admitting: Internal Medicine

## 2012-04-02 ENCOUNTER — Other Ambulatory Visit: Payer: Self-pay | Admitting: Cardiology

## 2012-04-02 DIAGNOSIS — I6529 Occlusion and stenosis of unspecified carotid artery: Secondary | ICD-10-CM

## 2012-04-05 ENCOUNTER — Encounter (INDEPENDENT_AMBULATORY_CARE_PROVIDER_SITE_OTHER): Payer: Medicare Other

## 2012-04-05 DIAGNOSIS — I6529 Occlusion and stenosis of unspecified carotid artery: Secondary | ICD-10-CM

## 2012-04-11 ENCOUNTER — Telehealth: Payer: Self-pay | Admitting: Cardiovascular Disease

## 2012-04-11 NOTE — Telephone Encounter (Signed)
Spoke with pt and reviewed carotid artery doppler results with him 

## 2012-04-11 NOTE — Telephone Encounter (Signed)
Pt rtn call to Dennie Bible, pls call [phone]

## 2012-05-25 ENCOUNTER — Encounter: Payer: Self-pay | Admitting: Cardiovascular Disease

## 2012-05-25 NOTE — Progress Notes (Signed)
Pt did not show up for appt. cdm         

## 2012-07-03 ENCOUNTER — Encounter: Payer: Self-pay | Admitting: Cardiovascular Disease

## 2012-08-02 ENCOUNTER — Encounter: Payer: Self-pay | Admitting: Cardiovascular Disease

## 2012-08-02 ENCOUNTER — Ambulatory Visit (INDEPENDENT_AMBULATORY_CARE_PROVIDER_SITE_OTHER): Payer: Medicare Other | Admitting: Cardiovascular Disease

## 2012-08-02 VITALS — BP 140/82 | HR 61 | Ht 70.0 in | Wt 199.0 lb

## 2012-08-02 DIAGNOSIS — E785 Hyperlipidemia, unspecified: Secondary | ICD-10-CM

## 2012-08-02 DIAGNOSIS — I10 Essential (primary) hypertension: Secondary | ICD-10-CM

## 2012-08-02 DIAGNOSIS — I2510 Atherosclerotic heart disease of native coronary artery without angina pectoris: Secondary | ICD-10-CM

## 2012-08-02 MED ORDER — ROSUVASTATIN CALCIUM 40 MG PO TABS: 40.0000 mg | ORAL_TABLET | Freq: Every day | ORAL | Status: DC

## 2012-08-02 NOTE — Patient Instructions (Signed)
Your physician wants you to follow-up in:  12 months. You will receive a reminder letter in the mail two months in advance. If you don't receive a letter, please call our office to schedule the follow-up appointment.  Your physician has requested that you have an exercise tolerance test. For further information please visit [email or web address]. Please also follow instruction sheet, as given.    

## 2012-08-02 NOTE — Progress Notes (Signed)
History of Present Illness: 72 yo male with history of HTN, moderaate non-obstructive CAD by cath 2011 (40% prox LAD, 50% mid LAD, 30% Circ, 30% RCA), GERD, hyperlipidemia and carotid artery disease who is here today for follow up. He has been followed in the past by Dr. Juanda Chance for his cardiac issues. He had a workup 2011 for dizziness including carotid artery dopplers (mild bilateral disease, <39%) and normal echo. HCTZ and Coreg were stopped which helped his dizziness. I saw him last in September 2012. Repeat carotid artery dopplers 04/05/12 with mild bilateral disease.   He is here today for follow up. He has had no chest pains or SOB, diaphoresis, n/v. His dizziness has improved since he stopped the HCTZ and Coreg but he still gets a little dizzy when he first stands. He has been drinking 8 glasses of water per day. He has lost 25 lbs intentionally with dieting.   Primary Care Physician: Eric Form  Last Lipid Profile: Followed in primary care.   Past Medical History  Diagnosis Date  . Colon polyp   . Arthritis   . Hearing loss   . MRSA (methicillin resistant staph aureus) culture positive   . CAD (coronary artery disease)   . Hiatal hernia   . Seizures     last in 1970s  . Diverticulosis of colon   . Coronary artery disease     moderate disease by cath 2011  . Seizure disorder   . GERD (gastroesophageal reflux disease)   . Hypertension   . Hyperlipidemia     Past Surgical History  Procedure Laterality Date  . Carpal tunnel release  1994    left  . Knee surgery  1996    left - scope  . Spine surgery      C2, C3, C4  . Nose surgery      twice  . Rotator cuff repair      twice, both shoulders  . Foot surgery      right -twice, left foot once  . Trigger finger release      both hands, twice  . Compression fracture    . C-spine fusion    . Vasectomy    . Rotator cuff repair    . Nasal septum surgery    . Trigger finger release    . Carpal tunnel release      left    . Knee arthroscopy      left knee  . Bunionectomy    . Hernia repair      Umbilical with PVP    Current Outpatient Prescriptions  Medication Sig Dispense Refill  . amLODipine (NORVASC) 10 MG tablet Take 1 tablet by mouth Daily.      [name] Kitchen aspirin 81 MG tablet Take 81 mg by mouth daily.        . Calcium Carbonate (CALCIUM 600 PO) Take by mouth.        . losartan (COZAAR) 100 MG tablet Take 1 tablet by mouth Daily.      . Multiple Vitamin (MULTIVITAMIN) tablet Take 1 tablet by mouth daily.        . nitroGLYCERIN (NITROSTAT) 0.4 MG SL tablet Place 0.4 mg under the tongue every 5 (five) minutes as needed.        [name] Kitchen omeprazole (PRILOSEC) 20 MG capsule Take 20 mg by mouth daily.        . phenytoin (DILANTIN) 100 MG ER capsule Take 400 mg by mouth 2 (two) times daily.        [name] Kitchen  rosuvastatin (CRESTOR) 40 MG tablet Take 1 tablet (40 mg total) by mouth daily.  90 tablet  3   No current facility-administered medications for this visit.    Allergies  Allergen Reactions  . Ramipril     REACTION: causes cough  . Ramipril Other (See Comments)    Causes patient cough    History   Social History  . Marital Status: Married    Spouse Name: N/A    Number of Children: N/A  . Years of Education: N/A   Occupational History  . Not on file.   Social History Main Topics  . Smoking status: Former Smoker    Quit date: 04/25/1969  . Smokeless tobacco: Not on file  . Alcohol Use: Yes     Comment: socially  . Drug Use: No  . Sexually Active:    Other Topics Concern  . Not on file   Social History Narrative   ** Merged History Encounter **        Family History  Problem Relation Age of Onset  . Diabetes Father   . Heart disease Father   . Hyperlipidemia    . Diabetes    . Hypertension    . Seizures    . Thyroid disease    . Heart attack Father 81    Review of Systems:  As stated in the HPI and otherwise negative.   BP 140/82  Pulse 61  Ht 5\' 10"  (1.778 m)  Wt 199 lb (90.266 kg)   BMI 28.55 kg/m2  Physical Examination: General: Well developed, well nourished, NAD HEENT: OP clear, mucus membranes moist SKIN: warm, dry. No rashes. Neuro: No focal deficits Musculoskeletal: Muscle strength 5/5 all ext Psychiatric: Mood and affect normal Neck: No JVD, no carotid bruits, no thyromegaly, no lymphadenopathy. Lungs:Clear bilaterally, no wheezes, rhonci, crackles Cardiovascular: Regular rate and rhythm. No murmurs, gallops or rubs. Abdomen:Soft. Bowel sounds present. Non-tender.  Extremities: No lower extremity edema. Pulses are 2 + in the bilateral DP/PT.  EKG: NSR, rate 61 bpm. LAFB.   Assessment and Plan:   1. CAD: Moderate disease by cath 2011. He has had no exertional pain. No ischemic workup since 2011. Will arrange exercise stress test (treadmill only)  to exclude ischemia.   2. HYPERTENSION: BP well controlled at home.      3. HYPERLIPIDEMIA: He has been holding his statin. Will restart after long discussion about importance of this medication with CAD. Lipids followed in primary care.

## 2012-08-02 NOTE — Addendum Note (Signed)
Addended by: Dossie Arbour on: 08/02/2012 04:58 PM   Modules accepted: Orders

## 2012-08-28 ENCOUNTER — Encounter: Payer: Medicare Other | Admitting: Physician Assistant

## 2012-09-26 ENCOUNTER — Ambulatory Visit (INDEPENDENT_AMBULATORY_CARE_PROVIDER_SITE_OTHER): Payer: Medicare Other | Admitting: Physician Assistant

## 2012-09-26 DIAGNOSIS — I2510 Atherosclerotic heart disease of native coronary artery without angina pectoris: Secondary | ICD-10-CM

## 2012-09-26 DIAGNOSIS — R079 Chest pain, unspecified: Secondary | ICD-10-CM

## 2012-09-26 NOTE — Patient Instructions (Addendum)
Your physician has requested that you have a lexiscan myoview. For further information please visit [email or web address]. Please follow instruction sheet, as given.   

## 2012-09-26 NOTE — Progress Notes (Signed)
Exercise Treadmill Test  Pre-Exercise Testing Evaluation Rhythm: normal sinus  Rate: 61     Test  Exercise Tolerance Test Ordering MD: Melene Muller, MD  Interpreting MD: Tereso Newcomer, [name]  Unique Test No: 1  Treadmill:  1  Indication for ETT: known ASHD  Contraindication to ETT: No   Stress Modality: exercise - treadmill  Cardiac Imaging Performed: non   Protocol: standard Bruce - maximal  Max BP:  183/81  Max MPHR (bpm):  151 85% MPR (bpm):  128  MPHR obtained (bpm):  125 % MPHR obtained:  83  Reached 85% MPHR (min:sec):  n/a Total Exercise Time (min-sec):  9:01  Workload in METS:  10.1 Borg Scale: 13  Reason ETT Terminated:  fatigue    ST Segment Analysis At Rest: normal ST segments - no evidence of significant ST depression With Exercise: no evidence of significant ST depression  Other Information Arrhythmia:  No Angina during ETT:  present (1) Quality of ETT:  non-diagnostic  ETT Interpretation:  normal - no evidence of ischemia by ST analysis at submaximal exercise  Comments: Good exercise tolerance. He did report chest pain described as squeezing at the end of exercise. Normal BP response to exercise. No ST-T changes to suggest ischemia at submaximal exercise.   Recommendations: Overall low risk findings on ETT. However, he had symptoms c/w angina on the treadmill. D/w Dr. Verne Carrow  Will schedule [facility]. Tereso Newcomer, [name]   09/26/2012 5:06 PM

## 2012-10-01 ENCOUNTER — Encounter: Payer: Self-pay | Admitting: Cardiology

## 2012-10-01 ENCOUNTER — Telehealth: Payer: Self-pay | Admitting: Cardiovascular Disease

## 2012-10-01 ENCOUNTER — Encounter: Payer: Self-pay | Admitting: Cardiovascular Disease

## 2012-10-01 NOTE — Telephone Encounter (Signed)
New Prob   Pt states he does not feel nuclear stress test is necessary and would like to speak to nurse regarding this. Test scheduled for 6/11.

## 2012-10-01 NOTE — Telephone Encounter (Signed)
Spoke with patient who is wondering whether the nuclear stress test that has been ordered is necessary. Patient states he experienced "squeezing" at the end of his test and he thinks this is related to his lack of exercise.  I advised patient that the nuclear stress test will help the physician further diagnose his chest discomfort.  Patient verbalized understanding.  Patient then had questions regarding insurance coverage of this procedure.  After talking with our pre-cert department I advised patient to call his insurance provider and gave him the CPT code and advised him to tell his insurance provider that the procedure is ordered hospital outpatient as it will be performed here at the [facility]. Office.

## 2012-10-03 ENCOUNTER — Ambulatory Visit (HOSPITAL_COMMUNITY): Payer: Medicare Other | Attending: Cardiology | Admitting: Radiology

## 2012-10-03 VITALS — BP 135/83 | Ht 70.0 in | Wt 195.0 lb

## 2012-10-03 DIAGNOSIS — R0609 Other forms of dyspnea: Secondary | ICD-10-CM | POA: Insufficient documentation

## 2012-10-03 DIAGNOSIS — R42 Dizziness and giddiness: Secondary | ICD-10-CM | POA: Insufficient documentation

## 2012-10-03 DIAGNOSIS — I779 Disorder of arteries and arterioles, unspecified: Secondary | ICD-10-CM | POA: Insufficient documentation

## 2012-10-03 DIAGNOSIS — R0789 Other chest pain: Secondary | ICD-10-CM | POA: Insufficient documentation

## 2012-10-03 DIAGNOSIS — R0989 Other specified symptoms and signs involving the circulatory and respiratory systems: Secondary | ICD-10-CM

## 2012-10-03 DIAGNOSIS — I2510 Atherosclerotic heart disease of native coronary artery without angina pectoris: Secondary | ICD-10-CM

## 2012-10-03 DIAGNOSIS — R079 Chest pain, unspecified: Secondary | ICD-10-CM

## 2012-10-03 DIAGNOSIS — Z87891 Personal history of nicotine dependence: Secondary | ICD-10-CM | POA: Insufficient documentation

## 2012-10-03 DIAGNOSIS — I10 Essential (primary) hypertension: Secondary | ICD-10-CM | POA: Insufficient documentation

## 2012-10-03 DIAGNOSIS — Z8249 Family history of ischemic heart disease and other diseases of the circulatory system: Secondary | ICD-10-CM | POA: Insufficient documentation

## 2012-10-03 MED ORDER — TECHNETIUM TC 99M SESTAMIBI GENERIC - CARDIOLITE
30.0000 | Freq: Once | INTRAVENOUS | Status: AC | PRN
  Administered 2012-10-03: 30 via INTRAVENOUS

## 2012-10-03 MED ORDER — REGADENOSON 0.4 MG/5ML IV SOLN
0.4000 mg | Freq: Once | INTRAVENOUS | Status: AC
  Administered 2012-10-03: 0.4 mg via INTRAVENOUS

## 2012-10-03 MED ORDER — TECHNETIUM TC 99M SESTAMIBI GENERIC - CARDIOLITE
10.0000 | Freq: Once | INTRAVENOUS | Status: AC | PRN
  Administered 2012-10-03: 10 via INTRAVENOUS

## 2012-10-03 NOTE — Progress Notes (Signed)
[facility] SITE 3 NUCLEAR MED [address] [place], [place] [address] [phone]    Cardiology Nuclear Med Study  Alex [name]. is a 72 y.o. male     MRN : [id number]     DOB: 05-22-1940  Procedure Date: 10/03/2012  Nuclear Med Background Indication for Stress Test:  Evaluation for Ischemia History:  05/09/08 MPS< activity inferior probable attenuation EF: 67%, 11/11 ECHO: 55-60%, 2011: Heart Cath: moderate N/O Dz, 09/26/12 GXT: NL with angina Cardiac Risk Factors: Carotid Disease, Family History - CAD, History of Smoking, Hypertension and Lipids  Symptoms:  Chest Pain with Exertion (last date of chest discomfort 09/26/12), Dizziness and DOE   Nuclear Pre-Procedure Caffeine/Decaff Intake:  None NPO After: 7:30pm   Lungs:  clear O2 Sat: 96% on room air. IV 0.9% NS with Angio Cath:  22g  IV Site: R Antecubital x 1, tolerated well IV Started by:  Irean Hong, RN  Chest Size (in):  44 Cup Size: n/a  Height: 5\' 10"  (1.778 m)  Weight:  195 lb (88.451 kg)  BMI:  Body mass index is 27.98 kg/(m^2). Tech Comments:  Norvasc 24 hrs ago    Nuclear Med Study 1 or 2 day study: 1 day  Stress Test Type:  Treadmill/Lexiscan  Reading MD: Willa Rough, MD  Order Authorizing Provider:  Verne Carrow, MD, and Tereso Newcomer, [facility]  Resting Radionuclide: Technetium 28m Sestamibi  Resting Radionuclide Dose: 11.0 mCi   Stress Radionuclide:  Technetium 56m Sestamibi  Stress Radionuclide Dose: 33.0 mCi           Stress Protocol Rest HR: 55 Stress HR: 93  Rest BP: 135/83 Stress BP: 163/108  Exercise Time (min): n/a METS: n/a   Predicted Max HR: 151 bpm % Max HR: 61.59 bpm Rate Pressure Product: 01027   Dose of Adenosine (mg):  n/a Dose of Lexiscan: 0.4 mg  Dose of Atropine (mg): n/a Dose of Dobutamine: n/a mcg/kg/min (at max HR)  Stress Test Technologist: Milana Na, EMT-P  Nuclear Technologist:  Domenic Polite, CNMT     Rest Procedure:  Myocardial perfusion  imaging was performed at rest 45 minutes following the intravenous administration of Technetium 58m Sestamibi. Rest ECG: sinus bradycardia with no QRS abnormality  Stress Procedure:  The patient received IV Lexiscan 0.4 mg over 15-seconds with concurrent low level exercise and then Technetium 71m Sestamibi was injected at 30-seconds while the patient continued walking one more minute. This patient had dizziness and a headache with the the Lexiscan injection. Quantitative spect images were obtained after a 45-minute delay. Stress ECG: No significant change from baseline ECG  QPS Raw Data Images:  Patient motion noted; appropriate software correction applied. Stress Images:  Normal homogeneous uptake in all areas of the myocardium. Rest Images:  Normal homogeneous uptake in all areas of the myocardium. Subtraction (SDS):  No evidence of ischemia. Transient Ischemic Dilatation (Normal <1.22):  1.15 Lung/Heart Ratio (Normal <0.45):  0.39  Quantitative Gated Spect Images QGS EDV:  111 ml QGS ESV:  42 ml  Impression Exercise Capacity:  Lexiscan with low level exercise. BP Response:  Normal blood pressure response. Clinical Symptoms:  Mild dizziness ECG Impression:  No significant ST segment change suggestive of ischemia. Comparison with Prior Nuclear Study:   There is no significant change since the study of January, 2010  Overall Impression:  Normal stress nuclear study.  There is no scar or ischemia. This is a low risk scan.  LV Ejection Fraction: 62%.  LV Wall Motion:  Normal Wall Motion.  Willa Rough, MD

## 2012-10-04 ENCOUNTER — Encounter: Payer: Self-pay | Admitting: Physician Assistant

## 2013-04-16 ENCOUNTER — Encounter: Payer: Self-pay | Admitting: Cardiovascular Disease

## 2013-04-30 ENCOUNTER — Telehealth: Payer: Self-pay | Admitting: Internal Medicine

## 2013-05-01 ENCOUNTER — Encounter: Payer: Self-pay | Admitting: Internal Medicine

## 2013-05-27 ENCOUNTER — Ambulatory Visit (AMBULATORY_SURGERY_CENTER): Payer: Self-pay

## 2013-05-27 VITALS — Ht 71.0 in | Wt 195.0 lb

## 2013-05-27 DIAGNOSIS — Z860100 Personal history of colon polyps, unspecified: Secondary | ICD-10-CM

## 2013-05-27 MED ORDER — MOVIPREP 100 G PO SOLR: 1.0000 | Freq: Once | ORAL | Status: DC

## 2013-05-29 ENCOUNTER — Telehealth: Payer: Self-pay | Admitting: Internal Medicine

## 2013-05-29 NOTE — Telephone Encounter (Signed)
Pt will come by [facility] 4th floor desk and pick up coupon for free MoviPrep

## 2013-05-30 ENCOUNTER — Telehealth: Payer: Self-pay | Admitting: Internal Medicine

## 2013-05-30 NOTE — Telephone Encounter (Signed)
Talked with pharmacist and coupon for free MoviPrep was accepted.  Pt will pick up Cameron from pharmacy

## 2013-06-11 ENCOUNTER — Encounter: Payer: Self-pay | Admitting: Internal Medicine

## 2013-06-17 ENCOUNTER — Ambulatory Visit (AMBULATORY_SURGERY_CENTER): Payer: Medicare HMO | Admitting: Internal Medicine

## 2013-06-17 ENCOUNTER — Encounter: Payer: Self-pay | Admitting: Internal Medicine

## 2013-06-17 VITALS — BP 134/86 | HR 60 | Temp 97.8°F | Resp 12 | Ht 71.0 in | Wt 195.0 lb

## 2013-06-17 DIAGNOSIS — D126 Benign neoplasm of colon, unspecified: Secondary | ICD-10-CM

## 2013-06-17 DIAGNOSIS — Z86010 Personal history of colonic polyps: Secondary | ICD-10-CM

## 2013-06-17 MED ORDER — SODIUM CHLORIDE 0.9 % IV SOLN: 500.0000 mL | INTRAVENOUS | Status: DC

## 2013-06-17 NOTE — Progress Notes (Signed)
Called to room to assist during endoscopic procedure.  Patient ID and intended procedure confirmed with present staff. Received instructions for my participation in the procedure from the performing physician.  

## 2013-06-17 NOTE — Progress Notes (Signed)
Procedure ends, to recovery, report given and VSS. 

## 2013-06-17 NOTE — Op Note (Signed)
[facility]  [address] [place], [phone]   COLONOSCOPY PROCEDURE REPORT  PATIENT: Alex [name], Alex [name]  MR#: [id number] BIRTHDATE: [birth year] , 72  yrs. old GENDER: Male ENDOSCOPIST: Eustace Quail, MD REFERRED BY:W.  Lutricia Feil, M.D. PROCEDURE DATE:  06/17/2013 PROCEDURE:   Colonoscopy with snare polypectomy x 2 First Screening Colonoscopy - Avg.  risk and is 50 yrs.  old or older - No.  Prior Negative Screening - Now for repeat screening. N/A  History of Adenoma - Now for follow-up colonoscopy & has been > or = to 3 yrs.  Yes hx of adenoma.  Has been 3 or more years since last colonoscopy.  Polyps Removed Today? Yes. ASA CLASS:   Class II INDICATIONS:Patient's personal history of adenomatous colon polyps. Prior exams Dr Gala Romney (last one per pt about 7 yrs ago) MEDICATIONS: MAC sedation, administered by CRNA and propofol (Diprivan) 240mg  IV  DESCRIPTION OF PROCEDURE:   After the risks benefits and alternatives of the procedure were thoroughly explained, informed consent was obtained.  A digital rectal exam revealed no abnormalities of the rectum.   The LB [id number]  endoscope was introduced through the anus and advanced to the cecum, which was identified by both the appendix and ileocecal valve. No adverse events experienced.   The quality of the prep was excellent, using MoviPrep  The instrument was then slowly withdrawn as the colon was fully examined.  COLON FINDINGS: Two sessile polyps ranging between 3-64mm in size were found in the ascending colon.  A polypectomy was performed with a cold snare.  The resection was complete and the polyp tissue was completely retrieved.   Moderate diverticulosis was noted  in the left colon.   The colon mucosa was otherwise normal. Retroflexed views revealed internal hemorrhoids. The time to cecum=2 minutes 55 seconds.  Withdrawal time=13 minutes 57 seconds. The scope was withdrawn and the procedure  completed. COMPLICATIONS: There were no complications.  ENDOSCOPIC IMPRESSION: 1.   Two polyps were found in the ascending colon; polypectomy was performed with a cold snare 2.   Moderate diverticulosis was noted in the left colon 3.   The colon mucosa was otherwise normal  RECOMMENDATIONS: 1. Follow up colonoscopy in 5 years   eSigned:  Eustace Quail, MD 06/17/2013 10:14 AM   cc: Janalyn Rouse, MD and The Patient

## 2013-06-17 NOTE — Patient Instructions (Signed)
YOU HAD AN ENDOSCOPIC PROCEDURE TODAY AT THE [facility]: Refer to the procedure report that was given to you for any specific questions about what was found during the examination.  If the procedure report does not answer your questions, please call your gastroenterologist to clarify.  If you requested that your care partner not be given the details of your procedure findings, then the procedure report has been included in a sealed envelope for you to review at your convenience later.  YOU SHOULD EXPECT: Some feelings of bloating in the abdomen. Passage of more gas than usual.  Walking can help get rid of the air that was put into your GI tract during the procedure and reduce the bloating. If you had a lower endoscopy (such as a colonoscopy or flexible sigmoidoscopy) you may notice spotting of blood in your stool or on the toilet paper. If you underwent a bowel prep for your procedure, then you may not have a normal bowel movement for a few days.  DIET: Your first meal following the procedure should be a light meal and then it is ok to progress to your normal diet.  A half-sandwich or bowl of soup is an example of a good first meal.  Heavy or fried foods are harder to digest and may make you feel nauseous or bloated.  Likewise meals heavy in dairy and vegetables can cause extra gas to form and this can also increase the bloating.  Drink plenty of fluids but you should avoid alcoholic beverages for 24 hours.  ACTIVITY: Your care partner should take you home directly after the procedure.  You should plan to take it easy, moving slowly for the rest of the day.  You can resume normal activity the day after the procedure however you should NOT DRIVE or use heavy machinery for 24 hours (because of the sedation medicines used during the test).    SYMPTOMS TO REPORT IMMEDIATELY: A gastroenterologist can be reached at any hour.  During normal business hours, 8:30 AM to 5:00 PM Monday through Friday,  call [phone].  After hours and on weekends, please call the GI answering service at [phone] who will take a message and have the physician on call contact you.   Following lower endoscopy (colonoscopy or flexible sigmoidoscopy):  Excessive amounts of blood in the stool  Significant tenderness or worsening of abdominal pains  Swelling of the abdomen that is new, acute  Fever of 100F or higher    FOLLOW UP: If any biopsies were taken you will be contacted by phone or by letter within the next 1-3 weeks.  Call your gastroenterologist if you have not heard about the biopsies in 3 weeks.  Our staff will call the home number listed on your records the next business day following your procedure to check on you and address any questions or concerns that you may have at that time regarding the information given to you following your procedure. This is a courtesy call and so if there is no answer at the home number and we have not heard from you through the emergency physician on call, we will assume that you have returned to your regular daily activities without incident.  SIGNATURES/CONFIDENTIALITY: You and/or your care partner have signed paperwork which will be entered into your electronic medical record.  These signatures attest to the fact that that the information above on your After Visit Summary has been reviewed and is understood.  Full responsibility of the confidentiality   this discharge information lies with you and/or your care-partner.  INFORMATION ON POLYPS AND DIVERTICULOSIS GIVEN TO YOU TODAY 

## 2013-06-18 ENCOUNTER — Telehealth: Payer: Self-pay

## 2013-06-18 NOTE — Telephone Encounter (Signed)
  Follow up Call-  Call back number 06/17/2013  Post procedure Call Back phone  # [phone]  Permission to leave phone message Yes     Patient questions:  Do you have a fever, pain , or abdominal swelling? no Pain Score  0 *  Have you tolerated food without any problems? yes  Have you been able to return to your normal activities? yes  Do you have any questions about your discharge instructions: Diet   no Medications  no Follow up visit  no  Do you have questions or concerns about your Care? no  Actions: * If pain score is 4 or above: No action needed, pain <4.

## 2013-06-20 ENCOUNTER — Encounter: Payer: Self-pay | Admitting: Internal Medicine

## 2013-09-26 ENCOUNTER — Ambulatory Visit (INDEPENDENT_AMBULATORY_CARE_PROVIDER_SITE_OTHER): Payer: Medicare HMO | Admitting: Cardiovascular Disease

## 2013-09-26 ENCOUNTER — Encounter: Payer: Self-pay | Admitting: Cardiovascular Disease

## 2013-09-26 VITALS — BP 130/82 | HR 61 | Ht 72.0 in | Wt 203.0 lb

## 2013-09-26 DIAGNOSIS — R011 Cardiac murmur, unspecified: Secondary | ICD-10-CM

## 2013-09-26 DIAGNOSIS — I10 Essential (primary) hypertension: Secondary | ICD-10-CM

## 2013-09-26 DIAGNOSIS — E785 Hyperlipidemia, unspecified: Secondary | ICD-10-CM

## 2013-09-26 DIAGNOSIS — I2510 Atherosclerotic heart disease of native coronary artery without angina pectoris: Secondary | ICD-10-CM

## 2013-09-26 NOTE — Patient Instructions (Signed)

## 2013-09-26 NOTE — Progress Notes (Signed)
History of Present Illness: 73 yo male with history of HTN, moderate non-obstructive CAD by cath 2011 (40% prox LAD, 50% mid LAD, 30% Circ, 30% RCA), GERD, hyperlipidemia and carotid artery disease who is here today for follow up. Alex [name] has been followed in the past by Alex [name] for his cardiac issues. Alex [name] had a workup 2011 for dizziness including carotid artery dopplers (mild bilateral disease, <39%) and normal echo. HCTZ and Coreg were stopped which helped his dizziness. Repeat carotid artery dopplers 04/05/12 with mild bilateral disease. Stress myoview June 2014 without ischemia.   Alex [name] is here today for follow up. Alex [name] has had no chest pains or SOB, diaphoresis, n/v. His dizziness has improved since Alex [name] stopped the HCTZ and Coreg but Alex [name] still gets a little dizzy when Alex [name] first stands. Alex [name] has been drinking 8 glasses of water per day.   Primary Care Physician: Lang Snow  Last Lipid Profile: Followed in primary care.   Past Medical History  Diagnosis Date  . Colon polyp   . Arthritis   . Hearing loss   . MRSA (methicillin resistant staph aureus) culture positive   . CAD (coronary artery disease)     Lexiscan Myoview 6/14: Normal study, no scar or ischemia, EF 62%  . Hiatal hernia   . Seizures     last in 1970s  . Diverticulosis of colon   . Coronary artery disease     moderate disease by cath 2011  . Seizure disorder   . GERD (gastroesophageal reflux disease)   . Hypertension   . Hyperlipidemia     Past Surgical History  Procedure Laterality Date  . Carpal tunnel release  1994    left  . Knee surgery  1996    left - scope  . Spine surgery      C2, C3, C4  . Nose surgery      twice  . Rotator cuff repair      twice, both shoulders  . Foot surgery      right -twice, left foot once  . Trigger finger release      both hands, twice  . Compression fracture    . C-spine fusion    . Vasectomy    . Rotator cuff repair    . Nasal septum surgery    . Trigger finger release    .  Error    . Knee arthroscopy      left knee  . Bunionectomy    . Hernia repair      Umbilical with PVP    Current Outpatient Prescriptions  Medication Sig Dispense Refill  . amLODipine (NORVASC) 10 MG tablet Take 0.5 tablets by mouth Daily.       [name] Kitchen aspirin 81 MG tablet Take 81 mg by mouth daily.        . cholecalciferol (VITAMIN D) 1000 UNITS tablet Take 1,000 Units by mouth 2 (two) times daily.      [name] Kitchen losartan (COZAAR) 100 MG tablet Take 1 tablet by mouth Daily.      . Multiple Vitamin (MULTIVITAMIN) tablet Take 1 tablet by mouth daily.        . nitroGLYCERIN (NITROSTAT) 0.4 MG SL tablet Place 0.4 mg under the tongue every 5 (five) minutes as needed.        [name] Kitchen omeprazole (PRILOSEC) 20 MG capsule Take 20 mg by mouth daily.        . phenytoin (DILANTIN) 100 MG ER capsule Take 400 mg by mouth  2 (two) times daily.        . rosuvastatin (CRESTOR) 40 MG tablet Take 80 mg by mouth daily.      [name] Kitchen triamcinolone cream (KENALOG) 0.1 %        No current facility-administered medications for this visit.    Allergies  Allergen Reactions  . Ramipril     REACTION: causes cough  . Ramipril Other (See Comments)    Causes patient cough    History   Social History  . Marital Status: Married    Spouse Name: N/A    Number of Children: N/A  . Years of Education: N/A   Occupational History  . Not on file.   Social History Main Topics  . Smoking status: Former Smoker    Quit date: 04/25/1969  . Smokeless tobacco: Former Systems developer  . Alcohol Use: 4.2 - 8.4 oz/week    7-14 Glasses of wine per week     Comment: socially shots of wiskey  . Drug Use: No  . Sexual Activity: Not on file   Other Topics Concern  . Not on file   Social History Narrative   ** Merged History Encounter **        Family History  Problem Relation Age of Onset  . Diabetes Father   . Heart disease Father   . Heart attack Father 24  . Hyperlipidemia    . Diabetes    . Hypertension    . Seizures    . Thyroid  disease    . Colon cancer Neg Hx     Review of Systems:  As stated in the HPI and otherwise negative.   BP 130/82  Pulse 61  Ht 6' (1.829 m)  Wt 203 lb (92.08 kg)  BMI 27.53 kg/m2  Physical Examination: General: Well developed, well nourished, NAD HEENT: OP clear, mucus membranes moist SKIN: warm, dry. No rashes. Neuro: No focal deficits Musculoskeletal: Muscle strength 5/5 all ext Psychiatric: Mood and affect normal Neck: No JVD, no carotid bruits, no thyromegaly, no lymphadenopathy. Lungs:Clear bilaterally, no wheezes, rhonci, crackles Cardiovascular: Regular rate and rhythm. No murmurs, gallops or rubs. Abdomen:Soft. Bowel sounds present. Non-tender.  Extremities: No lower extremity edema. Pulses are 2 + in the bilateral DP/PT.  EKG: NSR, rate 61 bpm.   Assessment and Plan:   1. CAD: Moderate disease by cath 2011. Stress myoview June 2014 without ischemia. Alex [name] has had no exertional pain.   2. HYPERTENSION: BP well controlled. No changes.      3. HYPERLIPIDEMIA: Alex [name] is on a statin. Lipids followed in primary care.   4. Cardiac murmur: Will arrange echo to assess

## 2013-09-30 ENCOUNTER — Ambulatory Visit (HOSPITAL_COMMUNITY): Payer: Medicare HMO | Attending: Cardiovascular Disease | Admitting: Radiology

## 2013-09-30 DIAGNOSIS — R011 Cardiac murmur, unspecified: Secondary | ICD-10-CM | POA: Insufficient documentation

## 2013-09-30 DIAGNOSIS — I2510 Atherosclerotic heart disease of native coronary artery without angina pectoris: Secondary | ICD-10-CM

## 2013-09-30 NOTE — Progress Notes (Signed)
Echocardiogram performed.  

## 2013-10-18 ENCOUNTER — Telehealth: Payer: Self-pay | Admitting: Cardiovascular Disease

## 2013-10-18 NOTE — Telephone Encounter (Signed)
Pt is aware that according to our records, is not a phone called made to him yesterday. The last phone call was made to him was in 10/09/13 to give him the echo results. Pt verbalized understanding.

## 2013-10-18 NOTE — Telephone Encounter (Signed)
New message    Patient calling stating triage nurse called him to say review his case .

## 2014-06-20 ENCOUNTER — Encounter: Payer: Self-pay | Admitting: Cardiovascular Disease

## 2014-06-23 ENCOUNTER — Encounter: Payer: Self-pay | Admitting: Cardiology

## 2014-10-30 ENCOUNTER — Ambulatory Visit (INDEPENDENT_AMBULATORY_CARE_PROVIDER_SITE_OTHER): Payer: Medicare HMO | Admitting: Cardiovascular Disease

## 2014-10-30 ENCOUNTER — Encounter: Payer: Self-pay | Admitting: Cardiovascular Disease

## 2014-10-30 VITALS — BP 140/78 | HR 63 | Wt 207.2 lb

## 2014-10-30 DIAGNOSIS — E785 Hyperlipidemia, unspecified: Secondary | ICD-10-CM | POA: Diagnosis not present

## 2014-10-30 DIAGNOSIS — I25110 Atherosclerotic heart disease of native coronary artery with unstable angina pectoris: Secondary | ICD-10-CM

## 2014-10-30 DIAGNOSIS — I10 Essential (primary) hypertension: Secondary | ICD-10-CM

## 2014-10-30 DIAGNOSIS — I350 Nonrheumatic aortic (valve) stenosis: Secondary | ICD-10-CM | POA: Diagnosis not present

## 2014-10-30 MED ORDER — NITROGLYCERIN 0.4 MG SL SUBL: 0.4000 mg | SUBLINGUAL_TABLET | SUBLINGUAL | Status: DC | PRN

## 2014-10-30 NOTE — Patient Instructions (Addendum)
Medication Instructions:  Your physician recommends that you continue on your current medications as directed. Please refer to the Current Medication list given to you today.   Labwork: none  Testing/Procedures: Your physician has requested that you have an echocardiogram. Echocardiography is a painless test that uses sound waves to create images of your heart. It provides your doctor with information about the size and shape of your heart and how well your heart's chambers and valves are working. This procedure takes approximately one hour. There are no restrictions for this procedure.  Your physician has requested that you have a lexiscan myoview. For further information please visit [email or web address]. Please follow instruction sheet, as given.    Follow-Up: Your physician recommends that you schedule a follow-up appointment in: about 10 weeks --Scheduled for January 16, 2015 at 4:45

## 2014-10-30 NOTE — Progress Notes (Signed)
Chief Complaint  Patient presents with  . Chest Pain      History of Present Illness: 74 yo male with history of HTN, moderate non-obstructive CAD by cath 2011 (40% prox LAD, 50% mid LAD, 30% Circ, 30% RCA), GERD, hyperlipidemia and carotid artery disease who is here today for follow up. He has been followed in the past by Dr. Olevia Perches for his cardiac issues. He had a workup 2011 for dizziness including carotid artery dopplers (mild bilateral disease, <39%) and normal echo. HCTZ and Coreg were stopped which helped his dizziness. Repeat carotid artery dopplers 04/05/12 with mild bilateral disease. Stress myoview June 2014 without ischemia. Dizziness in 2015 which improved off of HCTZ and Coreg.   He is here today for follow up. He has had several episodes of chest pain over the last few weeks. He has had to use NTG once. No SOB, diaphoresis, n/v. He works in Engineer, drilling. He does not exercise.   Primary Care Physician: Lang Snow  Last Lipid Profile: Followed in primary care.   Past Medical History  Diagnosis Date  . Colon polyp   . Arthritis   . Hearing loss   . MRSA (methicillin resistant staph aureus) culture positive   . CAD (coronary artery disease)     Lexiscan Myoview 6/14: Normal study, no scar or ischemia, EF 62%  . Hiatal hernia   . Seizures     last in 1970s  . Diverticulosis of colon   . Coronary artery disease     moderate disease by cath 2011  . Seizure disorder   . GERD (gastroesophageal reflux disease)   . Hypertension   . Hyperlipidemia     Past Surgical History  Procedure Laterality Date  . Carpal tunnel release  1994    left  . Knee surgery  1996    left - scope  . Spine surgery      C2, C3, C4  . Nose surgery      twice  . Rotator cuff repair      twice, both shoulders  . Foot surgery      right -twice, left foot once  . Trigger finger release      both hands, twice  . Compression fracture    . C-spine fusion    . Vasectomy    . Rotator  cuff repair    . Nasal septum surgery    . Trigger finger release    . Error    . Knee arthroscopy      left knee  . Bunionectomy    . Hernia repair      Umbilical with PVP    Current Outpatient Prescriptions  Medication Sig Dispense Refill  . aspirin 81 MG tablet Take 81 mg by mouth daily.      [name] Kitchen losartan (COZAAR) 100 MG tablet Take 1 tablet by mouth Daily.    . nitroGLYCERIN (NITROSTAT) 0.4 MG SL tablet Place 1 tablet (0.4 mg total) under the tongue every 5 (five) minutes as needed. 25 tablet 6  . omeprazole (PRILOSEC) 20 MG capsule Take 20 mg by mouth daily.      . phenytoin (DILANTIN) 100 MG ER capsule Take 400 mg by mouth 2 (two) times daily.      . rosuvastatin (CRESTOR) 40 MG tablet Take 80 mg by mouth daily.    [name] Kitchen triamcinolone cream (KENALOG) 0.1 %      No current facility-administered medications for this visit.    Allergies  Allergen Reactions  . Ramipril     REACTION: causes cough  . Ramipril Other (See Comments)    Causes patient cough    History   Social History  . Marital Status: Married    Spouse Name: N/A  . Number of Children: N/A  . Years of Education: N/A   Occupational History  . Not on file.   Social History Main Topics  . Smoking status: Former Smoker    Quit date: 04/25/1969  . Smokeless tobacco: Former Systems developer  . Alcohol Use: 4.2 - 8.4 oz/week    7-14 Glasses of wine per week     Comment: socially shots of wiskey  . Drug Use: No  . Sexual Activity: Not on file   Other Topics Concern  . Not on file   Social History Narrative   ** Merged History Encounter **        Family History  Problem Relation Age of Onset  . Diabetes Father   . Heart disease Father   . Heart attack Father 61  . Hyperlipidemia    . Diabetes    . Hypertension    . Seizures    . Thyroid disease    . Colon cancer Neg Hx     Review of Systems:  As stated in the HPI and otherwise negative.   BP 140/78 mmHg  Pulse 63  Wt 207 lb 3.2 oz (93.985 kg)  SpO2  97%  Physical Examination: General: Well developed, well nourished, NAD HEENT: OP clear, mucus membranes moist SKIN: warm, dry. No rashes. Neuro: No focal deficits Musculoskeletal: Muscle strength 5/5 all ext Psychiatric: Mood and affect normal Neck: No JVD, no carotid bruits, no thyromegaly, no lymphadenopathy. Lungs:Clear bilaterally, no wheezes, rhonci, crackles Cardiovascular: Regular rate and rhythm. Harsh systolic murmur. No gallops or rubs. Abdomen:Soft. Bowel sounds present. Non-tender.  Extremities: No lower extremity edema. Pulses are 2 + in the bilateral DP/PT.  Echo 09/30/13: Left ventricle: The cavity size was normal. There was mild concentric hypertrophy. Systolic function was vigorous. The estimated ejection fraction was in the range of 65% to 70%. Wall motion was normal; there were no regional wall motion abnormalities. Doppler parameters are consistent with abnormal left ventricular relaxation (grade 1 diastolic dysfunction). - Aortic valve: Trileaflet; moderately thickened, moderately calcified leaflets. Cusp separation was moderately reduced. There was mild stenosis. Mean gradient (S): 10 mm Hg. Peak gradient (S): 19 mm Hg. Valve area (VTI): 2.14 cm^2. Valve area (Vmax): 1.72 cm^2. - Aortic root: The aortic root was normal in size. - Ascending aorta: The ascending aorta was normal in size. - Mitral valve: Structurally normal valve. - Left atrium: The atrium was mildly dilated. - Right ventricle: Systolic function was normal. - Tricuspid valve: There was mild regurgitation. - Pulmonic valve: There was mild regurgitation. - Pulmonary arteries: Systolic pressure was within the normal range. - Pericardium, extracardiac: There was no pericardial effusion.  Impressions:  - Normal biventricular size and function. Mild concentric LVH. Mild aortic stenosis.  EKG:  EKG is ordered today. The ekg ordered today demonstrates NSR, rate 62 bpm.    Recent Labs: No results found for requested labs within last 365 days.   Lipid Panel Followed in primary care   Wt Readings from Last 3 Encounters:  10/30/14 207 lb 3.2 oz (93.985 kg)  09/26/13 203 lb (92.08 kg)  06/17/13 195 lb (88.451 kg)     Other studies Reviewed: Additional studies/ records that were reviewed today include: . Review of the above  records demonstrates:    Assessment and Plan:   1. CAD/Unstable angina: Moderate disease by cath 2011. Stress myoview June 2014 without ischemia. He had exertional pain. Continue current medical therapy. Will arrange Lexiscan stress myoview to exclude ischemia.   2. HYPERTENSION: BP well controlled. No changes.      3. HYPERLIPIDEMIA: He is on a statin. Lipids followed in primary care.   4. Aortic stenosis: Mild by echo June 2015. Murmur is louder on exam. Will repeat echo now.   Current medicines are reviewed at length with the patient today.  The patient does not have concerns regarding medicines.  The following changes have been made:  no change  Labs/ tests ordered today include:   Orders Placed This Encounter  Procedures  . Myocardial Perfusion Imaging  . EKG 12-Lead  . Echocardiogram    Disposition:   FU with me in 8 weeks  Signed, [name], MD 10/30/2014 5:24 PM    [address], [place], [place]  [address] Phone: [phone]; Fax: [phone]

## 2014-11-12 ENCOUNTER — Telehealth (HOSPITAL_COMMUNITY): Payer: Self-pay

## 2014-11-12 NOTE — Telephone Encounter (Signed)
Patient given detailed instructions per Myocardial Perfusion Study Information Sheet for test on 11-14-2014 at 0845. Patient Notified to arrive at [facility] for Echo, and that it is imperative to arrive on time for appointment to keep from having the test rescheduled. Patient verbalized understanding. Oletta Lamas, Kinjal Neitzke A

## 2014-11-14 ENCOUNTER — Ambulatory Visit (HOSPITAL_BASED_OUTPATIENT_CLINIC_OR_DEPARTMENT_OTHER): Payer: Commercial Managed Care - HMO

## 2014-11-14 ENCOUNTER — Ambulatory Visit (HOSPITAL_COMMUNITY): Payer: Commercial Managed Care - HMO | Attending: Cardiology

## 2014-11-14 ENCOUNTER — Other Ambulatory Visit: Payer: Self-pay

## 2014-11-14 DIAGNOSIS — I517 Cardiomegaly: Secondary | ICD-10-CM | POA: Insufficient documentation

## 2014-11-14 DIAGNOSIS — I350 Nonrheumatic aortic (valve) stenosis: Secondary | ICD-10-CM | POA: Diagnosis not present

## 2014-11-14 DIAGNOSIS — I25110 Atherosclerotic heart disease of native coronary artery with unstable angina pectoris: Secondary | ICD-10-CM | POA: Insufficient documentation

## 2014-11-14 DIAGNOSIS — I371 Nonrheumatic pulmonary valve insufficiency: Secondary | ICD-10-CM | POA: Diagnosis not present

## 2014-11-14 LAB — MYOCARDIAL PERFUSION IMAGING
CHL CUP NUCLEAR SDS: 1
CHL CUP NUCLEAR SRS: 4
CSEPPHR: 81 {beats}/min
LHR: 0.35
LV dias vol: 114 mL
LVSYSVOL: 38 mL
Rest HR: 55 {beats}/min
SSS: 5
TID: 1.04

## 2014-11-14 MED ORDER — TECHNETIUM TC 99M SESTAMIBI GENERIC - CARDIOLITE
10.1000 | Freq: Once | INTRAVENOUS | Status: AC | PRN
  Administered 2014-11-14: 10.1 via INTRAVENOUS

## 2014-11-14 MED ORDER — REGADENOSON 0.4 MG/5ML IV SOLN
0.4000 mg | Freq: Once | INTRAVENOUS | Status: AC
  Administered 2014-11-14: 0.4 mg via INTRAVENOUS

## 2014-11-14 MED ORDER — TECHNETIUM TC 99M SESTAMIBI GENERIC - CARDIOLITE
31.5000 | Freq: Once | INTRAVENOUS | Status: AC | PRN
  Administered 2014-11-14: 32 via INTRAVENOUS

## 2015-01-15 ENCOUNTER — Encounter: Payer: Self-pay | Admitting: *Deleted

## 2015-01-16 ENCOUNTER — Encounter: Payer: Self-pay | Admitting: Cardiovascular Disease

## 2015-01-16 ENCOUNTER — Ambulatory Visit (INDEPENDENT_AMBULATORY_CARE_PROVIDER_SITE_OTHER): Payer: Commercial Managed Care - HMO | Admitting: Cardiovascular Disease

## 2015-01-16 VITALS — BP 130/89 | HR 68 | Ht 72.0 in | Wt 209.0 lb

## 2015-01-16 DIAGNOSIS — I25118 Atherosclerotic heart disease of native coronary artery with other forms of angina pectoris: Secondary | ICD-10-CM | POA: Diagnosis not present

## 2015-01-16 DIAGNOSIS — I350 Nonrheumatic aortic (valve) stenosis: Secondary | ICD-10-CM | POA: Diagnosis not present

## 2015-01-16 DIAGNOSIS — E785 Hyperlipidemia, unspecified: Secondary | ICD-10-CM | POA: Diagnosis not present

## 2015-01-16 DIAGNOSIS — I10 Essential (primary) hypertension: Secondary | ICD-10-CM | POA: Diagnosis not present

## 2015-01-16 NOTE — Progress Notes (Signed)
Chief Complaint  Patient presents with  . Chest Pain   History of Present Illness: 74 yo male with history of HTN, moderate non-obstructive CAD by cath 2011 (40% prox LAD, 50% mid LAD, 30% Circ, 30% RCA), GERD, hyperlipidemia and carotid artery disease who is here today for follow up. He has been followed in the past by Dr. Olevia Perches for his cardiac issues. He had a workup 2011 for dizziness including carotid artery dopplers (mild bilateral disease, <39%) and normal echo. HCTZ and Coreg were stopped which helped his dizziness. Repeat carotid artery dopplers 04/05/12 with mild bilateral disease. Stress myoview June 2014 without ischemia. Dizziness in 2015 which improved off of HCTZ and Coreg. Stress myoview July 2016 with no ischemia. Echo July 2016 with normal LV function, mild AS.   He is here today for follow up. He has mild chest pain with heavy exertion, unchanged. He has not used NTG since last visit here. No SOB, diaphoresis, n/v.He does not exercise but has been working in the yard some this summer.   Primary Care Physician: Lang Snow  Last Lipid Profile: Followed in primary care.   Past Medical History  Diagnosis Date  . Colon polyp   . Arthritis   . Hearing loss   . MRSA (methicillin resistant staph aureus) culture positive   . CAD (coronary artery disease)     Lexiscan Myoview 6/14: Normal study, no scar or ischemia, EF 62%  . Hiatal hernia   . Seizures     last in 1970s  . Diverticulosis of colon   . Coronary artery disease     moderate disease by cath 2011  . Seizure disorder   . GERD (gastroesophageal reflux disease)   . Hypertension   . Hyperlipidemia     Past Surgical History  Procedure Laterality Date  . Carpal tunnel release  1994    left  . Knee surgery  1996    left - scope  . Spine surgery      C2, C3, C4  . Nose surgery      twice  . Rotator cuff repair      twice, both shoulders  . Foot surgery      right -twice, left foot once  . Trigger finger  release      both hands, twice  . Compression fracture    . C-spine fusion    . Vasectomy    . Rotator cuff repair    . Nasal septum surgery    . Trigger finger release    . Error    . Knee arthroscopy      left knee  . Bunionectomy    . Hernia repair      Umbilical with PVP    Current Outpatient Prescriptions  Medication Sig Dispense Refill  . aspirin 81 MG tablet Take 81 mg by mouth daily.      . hydrochlorothiazide (HYDRODIURIL) 12.5 MG tablet Take 12.5 mg by mouth daily.    [name] Kitchen losartan (COZAAR) 100 MG tablet Take 1 tablet by mouth Daily.    . nitroGLYCERIN (NITROSTAT) 0.4 MG SL tablet Place 0.4 mg under the tongue every 5 (five) minutes as needed for chest pain.    [name] Kitchen omeprazole (PRILOSEC) 20 MG capsule Take 20 mg by mouth daily.      . phenytoin (DILANTIN) 100 MG ER capsule Take 400 mg by mouth 2 (two) times daily.      . rosuvastatin (CRESTOR) 40 MG tablet Take 40 mg by  mouth daily.    [name] Kitchen triamcinolone cream (KENALOG) 0.1 % Apply 1 application topically 2 (two) times daily.      No current facility-administered medications for this visit.    Allergies  Allergen Reactions  . Ramipril     REACTION: causes cough  . Ramipril Other (See Comments)    Causes patient cough    Social History   Social History  . Marital Status: Married    Spouse Name: N/A  . Number of Children: N/A  . Years of Education: N/A   Occupational History  . Not on file.   Social History Main Topics  . Smoking status: Former Smoker    Quit date: 04/25/1969  . Smokeless tobacco: Former Systems developer  . Alcohol Use: 4.2 - 8.4 oz/week    7-14 Glasses of wine per week     Comment: socially shots of wiskey  . Drug Use: No  . Sexual Activity: Not on file   Other Topics Concern  . Not on file   Social History Narrative   ** Merged History Encounter **        Family History  Problem Relation Age of Onset  . Diabetes Father   . Heart disease Father   . Heart attack Father 52  . Hyperlipidemia     . Diabetes    . Hypertension    . Seizures    . Thyroid disease    . Colon cancer Neg Hx   . Hypertension Father   . Stroke Neg Hx     Review of Systems:  As stated in the HPI and otherwise negative.   BP 130/89 mmHg  Pulse 68  Ht 6' (1.829 m)  Wt 209 lb (94.802 kg)  BMI 28.34 kg/m2  Physical Examination: General: Well developed, well nourished, NAD HEENT: OP clear, mucus membranes moist SKIN: warm, dry. No rashes. Neuro: No focal deficits Musculoskeletal: Muscle strength 5/5 all ext Psychiatric: Mood and affect normal Neck: No JVD, no carotid bruits, no thyromegaly, no lymphadenopathy. Lungs:Clear bilaterally, no wheezes, rhonci, crackles Cardiovascular: Regular rate and rhythm. Harsh systolic murmur. No gallops or rubs. Abdomen:Soft. Bowel sounds present. Non-tender.  Extremities: No lower extremity edema. Pulses are 2 + in the bilateral DP/PT.  Echo July 2016: Left ventricle: The cavity size was normal. There was mild concentric hypertrophy. Systolic function was normal. The estimated ejection fraction was in the range of 60% to 65%. Wall motion was normal; there were no regional wall motion abnormalities. There was an increased relative contribution of atrial contraction to ventricular filling. Doppler parameters are consistent with abnormal left ventricular relaxation (grade 1 diastolic dysfunction). - Aortic valve: Moderately calcified annulus. Trileaflet; mildly thickened, mildly calcified leaflets. There was very mild stenosis. - Mitral valve: Calcified annulus. - Right ventricle: The cavity size was mildly dilated. Wall thickness was normal. - Pulmonic valve: There was trivial regurgitation.  EKG:  EKG is not ordered today. The ekg ordered today demonstrates   Recent Labs: No results found for requested labs within last 365 days.   Lipid Panel Followed in primary care   Wt Readings from Last 3 Encounters:  01/16/15 209 lb (94.802  kg)  11/14/14 207 lb (93.895 kg)  10/30/14 207 lb 3.2 oz (93.985 kg)     Other studies Reviewed: Additional studies/ records that were reviewed today include: . Review of the above records demonstrates:    Assessment and Plan:   1. CAD/Unstable angina: Moderate disease by cath 2011. Stress myoview July 2016 without ischemia.  No change in chest pressure with heavy exertion.  Continue current medical therapy.   2. HYPERTENSION: BP well controlled. No changes.      3. HYPERLIPIDEMIA: He is on a statin. Lipids followed in primary care.   4. Aortic stenosis: Mild by echo July 2016. Will repeat echo in 2 years.   Current medicines are reviewed at length with the patient today.  The patient does not have concerns regarding medicines.  The following changes have been made:  no change  Labs/ tests ordered today include:   No orders of the defined types were placed in this encounter.    Disposition:   FU with me in 8 weeks  Signed, [name], MD 01/16/2015 5:20 PM    [facility] [address], [place], [place]  [address] Phone: [phone]; Fax: [phone]

## 2015-01-16 NOTE — Patient Instructions (Signed)
Medication Instructions:  Your physician recommends that you continue on your current medications as directed. Please refer to the Current Medication list given to you today.   Labwork: none  Testing/Procedures: none  Follow-Up: Your physician wants you to follow-up in: 6 months.  You will receive a reminder letter in the mail two months in advance. If you don't receive a letter, please call our office to schedule the follow-up appointment.       

## 2015-03-23 ENCOUNTER — Ambulatory Visit: Payer: Commercial Managed Care - HMO | Admitting: Podiatry

## 2015-06-10 DIAGNOSIS — Z0000 Encounter for general adult medical examination without abnormal findings: Secondary | ICD-10-CM | POA: Diagnosis not present

## 2015-06-10 DIAGNOSIS — R7301 Impaired fasting glucose: Secondary | ICD-10-CM | POA: Diagnosis not present

## 2015-06-10 DIAGNOSIS — Z125 Encounter for screening for malignant neoplasm of prostate: Secondary | ICD-10-CM | POA: Diagnosis not present

## 2015-06-10 DIAGNOSIS — E784 Other hyperlipidemia: Secondary | ICD-10-CM | POA: Diagnosis not present

## 2015-06-10 DIAGNOSIS — I10 Essential (primary) hypertension: Secondary | ICD-10-CM | POA: Diagnosis not present

## 2015-06-17 DIAGNOSIS — G4733 Obstructive sleep apnea (adult) (pediatric): Secondary | ICD-10-CM | POA: Diagnosis not present

## 2015-06-17 DIAGNOSIS — E784 Other hyperlipidemia: Secondary | ICD-10-CM | POA: Diagnosis not present

## 2015-06-17 DIAGNOSIS — I2510 Atherosclerotic heart disease of native coronary artery without angina pectoris: Secondary | ICD-10-CM | POA: Diagnosis not present

## 2015-06-17 DIAGNOSIS — R160 Hepatomegaly, not elsewhere classified: Secondary | ICD-10-CM | POA: Diagnosis not present

## 2015-06-17 DIAGNOSIS — Z0000 Encounter for general adult medical examination without abnormal findings: Secondary | ICD-10-CM | POA: Diagnosis not present

## 2015-06-17 DIAGNOSIS — I350 Nonrheumatic aortic (valve) stenosis: Secondary | ICD-10-CM | POA: Diagnosis not present

## 2015-06-17 DIAGNOSIS — R079 Chest pain, unspecified: Secondary | ICD-10-CM | POA: Diagnosis not present

## 2015-06-17 DIAGNOSIS — I10 Essential (primary) hypertension: Secondary | ICD-10-CM | POA: Diagnosis not present

## 2015-06-17 DIAGNOSIS — R569 Unspecified convulsions: Secondary | ICD-10-CM | POA: Diagnosis not present

## 2015-06-17 DIAGNOSIS — R809 Proteinuria, unspecified: Secondary | ICD-10-CM | POA: Diagnosis not present

## 2015-06-18 ENCOUNTER — Other Ambulatory Visit: Payer: Self-pay | Admitting: Internal Medicine

## 2015-06-18 ENCOUNTER — Telehealth: Payer: Self-pay | Admitting: Cardiovascular Disease

## 2015-06-18 DIAGNOSIS — R160 Hepatomegaly, not elsewhere classified: Secondary | ICD-10-CM

## 2015-06-18 NOTE — Telephone Encounter (Signed)
Alex [name] from [place] office calling stating Dr. [name] would like for Alex [name] to be seen before end of April.  She states records show he has been having CP off and on with exertion and some light headedness.  Made him an appointment w/Luke Dyann Kief on 3/1 while Dr. Julianne Handler is in office also that day.  She will send records from last OV and notify pt.

## 2015-06-18 NOTE — Telephone Encounter (Signed)
LMTCB

## 2015-06-18 NOTE — Telephone Encounter (Signed)
New Message  Recent exertional chest pain  Pt c/o of Chest Pain: 1. Are you having CP right now? Not that she knows of pt was sen on 06/17/2015. This is when he brought up that he was having chest pain 2. Are you experiencing any other symptoms (ex. SOB, nausea, vomiting, sweating)?  He has had persistent light headedness that is tolerable. Short term memory loss getting worse  3. How long have you been experiencing CP? Within 1 week  4. Is your CP continuous or coming and going? Coming and going  5. Have you taken Nitroglycerin? The Doctor doesn't say.   Comments: Chest pain while working in the yard. Walking up steps and it resolves after a few minutes. BP 140/80 He has had persistent light headedness that is tolerable. Short term memory loss getting worse   Joyice Faster called states that Dr. [name] would like a sooner appt with Dr. [name] only. Declined PA or NP. Please assist

## 2015-06-24 ENCOUNTER — Ambulatory Visit: Payer: Self-pay | Admitting: Cardiology

## 2015-06-26 ENCOUNTER — Ambulatory Visit
Admission: RE | Admit: 2015-06-26 | Discharge: 2015-06-26 | Disposition: A | Discharge status: Home / Self Care | Payer: Medicare HMO | Source: Ambulatory Visit | Attending: Internal Medicine | Admitting: Internal Medicine

## 2015-06-26 ENCOUNTER — Other Ambulatory Visit: Payer: Commercial Managed Care - HMO

## 2015-06-26 DIAGNOSIS — Z6830 Body mass index (BMI) 30.0-30.9, adult: Secondary | ICD-10-CM | POA: Diagnosis not present

## 2015-06-26 DIAGNOSIS — Z1389 Encounter for screening for other disorder: Secondary | ICD-10-CM | POA: Diagnosis not present

## 2015-06-26 DIAGNOSIS — R05 Cough: Secondary | ICD-10-CM | POA: Diagnosis not present

## 2015-06-26 DIAGNOSIS — N281 Cyst of kidney, acquired: Secondary | ICD-10-CM | POA: Diagnosis not present

## 2015-06-26 DIAGNOSIS — J101 Influenza due to other identified influenza virus with other respiratory manifestations: Secondary | ICD-10-CM | POA: Diagnosis not present

## 2015-06-26 DIAGNOSIS — R160 Hepatomegaly, not elsewhere classified: Secondary | ICD-10-CM

## 2015-07-10 ENCOUNTER — Encounter: Payer: Self-pay | Admitting: Nurse Practitioner

## 2015-07-10 ENCOUNTER — Ambulatory Visit: Payer: Self-pay | Admitting: Nurse Practitioner

## 2015-07-10 ENCOUNTER — Ambulatory Visit (INDEPENDENT_AMBULATORY_CARE_PROVIDER_SITE_OTHER): Payer: Medicare HMO | Admitting: Nurse Practitioner

## 2015-07-10 VITALS — BP 118/70 | HR 71 | Ht 71.0 in | Wt 211.4 lb

## 2015-07-10 DIAGNOSIS — E785 Hyperlipidemia, unspecified: Secondary | ICD-10-CM

## 2015-07-10 DIAGNOSIS — R0789 Other chest pain: Secondary | ICD-10-CM

## 2015-07-10 DIAGNOSIS — I10 Essential (primary) hypertension: Secondary | ICD-10-CM

## 2015-07-10 DIAGNOSIS — I350 Nonrheumatic aortic (valve) stenosis: Secondary | ICD-10-CM

## 2015-07-10 MED ORDER — NITROGLYCERIN 0.4 MG SL SUBL: 0.4000 mg | SUBLINGUAL_TABLET | SUBLINGUAL | Status: DC | PRN

## 2015-07-10 MED ORDER — ISOSORBIDE MONONITRATE ER 30 MG PO TB24: 15.0000 mg | ORAL_TABLET | Freq: Every day | ORAL | Status: DC

## 2015-07-10 NOTE — Patient Instructions (Addendum)
We will be checking the following labs today -    Medication Instructions:    Continue with your current medicines. BUT  I am adding Imdur 30 mg to take 1/2 a tablet to take daily  I have refilled your NTG today.    Testing/Procedures To Be Arranged:  N/A  Follow-Up:   See Dr. [name] next month as planned    Other Special Instructions:   Keep a journal about have much chest pain you have, when you use NTG, etc. Bring this to your next appointment.    If you need a refill on your cardiac medications before your next appointment, please call your pharmacy.   Call the [place] office at [phone] if you have any questions, problems or concerns.

## 2015-07-10 NOTE — Progress Notes (Signed)
CARDIOLOGY OFFICE NOTE  Date:  07/10/2015    Alex [name]. Date of Birth: Nov 15, 1940 Medical Record [id number]  PCP:  [name], MD  Cardiologist:  [facility]    Chief Complaint  Patient presents with  . Coronary Artery Disease  . Cardiac Valve Problem    Work in visit - seen for Dr. [name]    History of Present Illness: Alex Benyo. is a 75 y.o. male who presents today for a work in visit at the request of Dr. [name].   He has a history of HTN, moderate non-obstructive CAD by cath 2011 (40% prox LAD, 50% mid LAD, 30% Circ, 30% RCA), GERD, hyperlipidemia, aortic stenosis and carotid artery disease.   He had a workup 2011 for dizziness including carotid artery dopplers (mild bilateral disease, <39%) and normal echo at that time. HCTZ and Coreg were stopped which helped his dizziness. Repeat carotid artery dopplers 04/05/12 with mild bilateral disease.  Dizziness in 2015 which improved off of HCTZ and Coreg but was placed back on HCTZ.  Stress myoview July 2016 with no ischemia. Echo July 2016 with normal LV function, mild AS.   He was last seen back in September of 2016 - was noted to have mild chest pain with exertion. No NTG use - continued with medical management.   Phone call last month - "Peter Congo from [place] office calling stating Dr. [name] would like for Alex [name] to be seen before end of April. She states records show he has been having CP off and on with exertion and some light headedness. Made him an appointment w/Luke Dyann Kief on 3/1 while Dr. Julianne Handler is in office also that day. She will send records from last OV and notify pt."  Comes in today. Here with his wife. His history is very vague. Most of my questions are answered with "I don't remember". Tells me he has had chest pain - he "thinks" his last spell was 4 to 6 weeks ago and he "thinks" one intense spell was back in October. May have had more than what he was having back  in September - he "is not sure". He admits to using sl NTG - may have used 2 on one occasion. He does not seem worried or concerned. If he had not had an appointment with Dr. [name], he would not be here. Apparently was tested for memory issues at his visit with Dr. [name]. Transient dizziness. No syncope. Not short of breath. No regular exercise.Wife says he is always tired/fatigued.  Limited by back/leg pain.   Past Medical History  Diagnosis Date  . Colon polyp   . Arthritis   . Hearing loss   . MRSA (methicillin resistant staph aureus) culture positive   . CAD (coronary artery disease)     Lexiscan Myoview 6/14: Normal study, no scar or ischemia, EF 62%  . Hiatal hernia   . Seizures ([place])     last in 1970s  . Diverticulosis of colon   . Coronary artery disease     moderate disease by cath 2011  . Seizure disorder ([place])   . GERD (gastroesophageal reflux disease)   . Hypertension   . Hyperlipidemia     Past Surgical History  Procedure Laterality Date  . Carpal tunnel release  1994    left  . Knee surgery  1996    left - scope  . Spine surgery      C2, C3, C4  .  Nose surgery      twice  . Rotator cuff repair      twice, both shoulders  . Foot surgery      right -twice, left foot once  . Trigger finger release      both hands, twice  . Compression fracture    . C-spine fusion    . Vasectomy    . Rotator cuff repair    . Nasal septum surgery    . Trigger finger release    . Error    . Knee arthroscopy      left knee  . Bunionectomy    . Hernia repair      Umbilical with PVP     Medications: Current Outpatient Prescriptions  Medication Sig Dispense Refill  . aspirin 81 MG tablet Take 81 mg by mouth daily.      . cholecalciferol (VITAMIN D) 1000 units tablet Take 1,000 Units by mouth daily.    . hydrochlorothiazide (HYDRODIURIL) 12.5 MG tablet Take 12.5 mg by mouth daily.    [name] Kitchen losartan (COZAAR) 100 MG tablet Take 1 tablet by mouth Daily.    . nitroGLYCERIN  (NITROSTAT) 0.4 MG SL tablet Place 0.4 mg under the tongue every 5 (five) minutes as needed for chest pain.    . Omega-3 Fatty Acids (FISH OIL) 1000 MG CAPS Take by mouth daily.    [name] Kitchen omeprazole (PRILOSEC) 20 MG capsule Take 20 mg by mouth daily.      . phenytoin (DILANTIN) 100 MG ER capsule Take 400 mg by mouth 2 (two) times daily.      . rosuvastatin (CRESTOR) 40 MG tablet Take 40 mg by mouth daily.    [name] Kitchen triamcinolone cream (KENALOG) 0.1 % Apply 1 application topically 2 (two) times daily.      No current facility-administered medications for this visit.    Allergies: Allergies  Allergen Reactions  . Ramipril     REACTION: causes cough  . Ramipril Other (See Comments)    Causes patient cough    Social History: The patient  reports that he quit smoking about 46 years ago. He has quit using smokeless tobacco. He reports that he drinks about 4.2 - 8.4 oz of alcohol per week. He reports that he does not use illicit drugs.   Family History: The patient's family history includes Diabetes in his father; Heart attack (age of onset: 104) in his father; Heart disease in his father; Hypertension in his father. There is no history of Colon cancer or Stroke.   Review of Systems: Please see the history of present illness.   Otherwise, the review of systems is positive for none.   All other systems are reviewed and negative.   Physical Exam: VS:  BP 118/70 mmHg  Pulse 71  Ht 5\' 11"  (1.803 m)  Wt 211 lb 6.4 oz (95.89 kg)  BMI 29.50 kg/m2 .  BMI Body mass index is 29.5 kg/(m^2).  Wt Readings from Last 3 Encounters:  07/10/15 211 lb 6.4 oz (95.89 kg)  01/16/15 209 lb (94.802 kg)  11/14/14 207 lb (93.895 kg)    General: Pleasant. He is a poor historian. He is alert and in no acute distress.  HEENT: Normal. Neck: Supple, no JVD, carotid bruits, or masses noted.  Cardiac: Regular rate and rhythm. No murmurs, rubs, or gallops. No edema.  Respiratory:  Lungs are clear to auscultation  bilaterally with normal work of breathing.  GI: Soft and nontender.  MS: No deformity or atrophy. Gait  and ROM intact. Skin: Warm and dry. Color is normal.  Neuro:  Strength and sensation are intact and no gross focal deficits noted.  Psych: Alert, appropriate and with normal affect.   LABORATORY DATA:  EKG:  EKG is ordered today. This demonstrates NSR - inferior Q's.  Lab Results  Component Value Date   WBC 7.4 01/18/2011   HGB 16.4 01/18/2011   HCT 44.8 01/18/2011   PLT 169 01/18/2011   GLUCOSE 106* 01/18/2011   CHOL  07/06/2009    148        ATP III CLASSIFICATION:  <200     mg/dL   Desirable  200-239  mg/dL   Borderline High  >=240    mg/dL   High          TRIG 93 07/06/2009   HDL 50 07/06/2009   LDLCALC  07/06/2009    79        Total Cholesterol/HDL:CHD Risk Coronary Heart Disease Risk Table                     Men   Women  1/2 Average Risk   3.4   3.3  Average Risk       5.0   4.4  2 X Average Risk   9.6   7.1  3 X Average Risk  23.4   11.0        Use the calculated Patient Ratio above and the CHD Risk Table to determine the patient's CHD Risk.        ATP III CLASSIFICATION (LDL):  <100     mg/dL   Optimal  100-129  mg/dL   Near or Above                    Optimal  130-159  mg/dL   Borderline  160-189  mg/dL   High  >190     mg/dL   Very High   ALT 23 07/06/2009   AST 22 07/06/2009   NA 140 01/18/2011   K 3.7 01/18/2011   CL 105 01/18/2011   CREATININE 0.73 01/18/2011   BUN 16 01/18/2011   CO2 25 01/18/2011   TSH  07/05/2009    0.859 (NOTE) Please note change in reference ranges for ages 74W to 73Y. Test methodology is 3rd generation TSH   INR 1.03 09/04/2009    BNP (last 3 results) No results for input(s): BNP in the last 8760 hours.  ProBNP (last 3 results) No results for input(s): PROBNP in the last 8760 hours.   Other Studies Reviewed Today:  Echo July 2016: Left ventricle: The cavity size was normal. There was mild concentric  hypertrophy. Systolic function was normal. The estimated ejection fraction was in the range of 60% to 65%. Wall motion was normal; there were no regional wall motion abnormalities. There was an increased relative contribution of atrial contraction to ventricular filling. Doppler parameters are consistent with abnormal left ventricular relaxation (grade 1 diastolic dysfunction). - Aortic valve: Moderately calcified annulus. Trileaflet; mildly thickened, mildly calcified leaflets. There was very mild stenosis. - Mitral valve: Calcified annulus. - Right ventricle: The cavity size was mildly dilated. Wall thickness was normal. - Pulmonic valve: There was trivial regurgitation.   Myoview Study Highlights 10/2014    Nuclear stress EF: 67%.  The left ventricular ejection fraction is hyperdynamic (>65%).  There was no ST segment deviation noted during stress.  The study is normal.  This is a low risk study.  Normal study with no evidence of prior infarct or ischemia.     Assessment/Plan:  1. CAD/Unstable angina:  Moderate disease by cath 2011. Stress myoview July 2016 without ischemia. He reported having chest pain at his last visit - he is such a poor historian it is hard to discern if he is having a change in his symptoms. I would hold on repeat Myoview or cath for now. Adding low dose Imdur 15 mg daily. Cautioned about headache. Refilled his sl NTG. I have asked him to be more diligent about his symptoms and record these on a calendar as well as his NTG use. He is agreeable. Will see back next month as planned unless problems develop in the interim.  2. HYPERTENSION: BP well controlled. No changes.    3. HYPERLIPIDEMIA: He is on a statin. Lipids followed in primary care.   4. Aortic stenosis: Mild by echo July 2016. Will repeat echo in 2 years. I think low dose Imdur will be ok.   5. Memory disorder - he probably told me at least 15 times during this visit  "I don't remember..." - this may need further evaluation.   Current medicines are reviewed with the patient today.  The patient does not have concerns regarding medicines other than what has been noted above.  The following changes have been made:  See above.  Labs/ tests ordered today include:   No orders of the defined types were placed in this encounter.     Disposition:   FU with Dr. [name] next month as planned.   Patient is agreeable to this plan and will call if any problems develop in the interim.   Signed: Burtis Junes, RN, ANP-C 07/10/2015 3:37 PM  [facility] [address] [place],   [address] Phone: [phone] Fax: [phone]

## 2015-08-14 ENCOUNTER — Encounter: Payer: Self-pay | Admitting: Cardiovascular Disease

## 2015-08-14 ENCOUNTER — Ambulatory Visit (INDEPENDENT_AMBULATORY_CARE_PROVIDER_SITE_OTHER): Payer: Medicare HMO | Admitting: Cardiovascular Disease

## 2015-08-14 VITALS — BP 102/64 | HR 76 | Ht 70.0 in | Wt 210.4 lb

## 2015-08-14 DIAGNOSIS — I10 Essential (primary) hypertension: Secondary | ICD-10-CM

## 2015-08-14 DIAGNOSIS — E785 Hyperlipidemia, unspecified: Secondary | ICD-10-CM | POA: Diagnosis not present

## 2015-08-14 DIAGNOSIS — I350 Nonrheumatic aortic (valve) stenosis: Secondary | ICD-10-CM | POA: Diagnosis not present

## 2015-08-14 DIAGNOSIS — I25118 Atherosclerotic heart disease of native coronary artery with other forms of angina pectoris: Secondary | ICD-10-CM | POA: Diagnosis not present

## 2015-08-14 NOTE — Progress Notes (Signed)
Chief Complaint  Patient presents with  . Chest Pain   History of Present Illness: 75 yo male with history of HTN, moderate non-obstructive CAD by cath 2011 (40% prox LAD, 50% mid LAD, 30% Circ, 30% RCA), GERD, hyperlipidemia and carotid artery disease who is here today for follow up. He has been followed in the past by Dr. Olevia Perches for his cardiac issues. He had a workup 2011 for dizziness including carotid artery dopplers (mild bilateral disease, <39%) and normal echo. HCTZ and Coreg were stopped which helped his dizziness. Repeat carotid artery dopplers 04/05/12 with mild bilateral disease. Stress myoview June 2014 without ischemia. Dizziness in 2015 which improved off of HCTZ and Coreg. Stress myoview July 2016 with no ischemia. Echo July 2016 with normal LV function, mild AS. He was seen in our office March 2017 by Truitt Merle, NP and at that time noted occasional chest pain. Imdur was added.   He is here today for follow up. He has mild chest pain with heavy exertion, unchanged. He has not used NTG. No SOB, diaphoresis, n/v.   Primary Care Physician: Lang Snow  Last Lipid Profile: Followed in primary care.   Past Medical History  Diagnosis Date  . Colon polyp   . Arthritis   . Hearing loss   . MRSA (methicillin resistant staph aureus) culture positive   . CAD (coronary artery disease)     Lexiscan Myoview 6/14: Normal study, no scar or ischemia, EF 62%  . Hiatal hernia   . Seizures ([place])     last in 1970s  . Diverticulosis of colon   . Coronary artery disease     moderate disease by cath 2011  . Seizure disorder ([place])   . GERD (gastroesophageal reflux disease)   . Hypertension   . Hyperlipidemia     Past Surgical History  Procedure Laterality Date  . Carpal tunnel release  1994    left  . Knee surgery  1996    left - scope  . Spine surgery      C2, C3, C4  . Nose surgery      twice  . Rotator cuff repair      twice, both shoulders  . Foot surgery      right  -twice, left foot once  . Trigger finger release      both hands, twice  . Compression fracture    . C-spine fusion    . Vasectomy    . Rotator cuff repair    . Nasal septum surgery    . Trigger finger release    . Error    . Knee arthroscopy      left knee  . Bunionectomy    . Hernia repair      Umbilical with PVP    Current Outpatient Prescriptions  Medication Sig Dispense Refill  . aspirin 81 MG tablet Take 81 mg by mouth daily.      . cholecalciferol (VITAMIN D) 1000 units tablet Take 1,000 Units by mouth daily.    . hydrochlorothiazide (HYDRODIURIL) 12.5 MG tablet Take 12.5 mg by mouth daily.    . isosorbide mononitrate (IMDUR) 30 MG 24 hr tablet Take 0.5 tablets (15 mg total) by mouth daily. 30 tablet 3  . losartan (COZAAR) 100 MG tablet Take 1 tablet by mouth Daily.    . nitroGLYCERIN (NITROSTAT) 0.4 MG SL tablet Place 1 tablet (0.4 mg total) under the tongue every 5 (five) minutes as needed for chest pain. 25 tablet  6  . Omega-3 Fatty Acids (FISH OIL) 1000 MG CAPS Take 1,000 mg by mouth daily.     [name] Kitchen omeprazole (PRILOSEC) 20 MG capsule Take 20 mg by mouth daily.      . phenytoin (DILANTIN) 100 MG ER capsule Take 200 mg by mouth 2 (two) times daily.     . rosuvastatin (CRESTOR) 40 MG tablet Take 40 mg by mouth daily.    [name] Kitchen triamcinolone cream (KENALOG) 0.1 % Apply 1 application topically 2 (two) times daily as needed (FOR SKIN IRRITATION).      No current facility-administered medications for this visit.    Allergies  Allergen Reactions  . Ramipril     REACTION: causes cough  . Ramipril Other (See Comments)    Causes patient cough    Social History   Social History  . Marital Status: Married    Spouse Name: N/A  . Number of Children: N/A  . Years of Education: N/A   Occupational History  . Not on file.   Social History Main Topics  . Smoking status: Former Smoker    Quit date: 04/25/1969  . Smokeless tobacco: Former Systems developer  . Alcohol Use: 4.2 - 8.4 oz/week      7-14 Glasses of wine per week     Comment: socially shots of wiskey  . Drug Use: No  . Sexual Activity: Not on file   Other Topics Concern  . Not on file   Social History Narrative   ** Merged History Encounter **        Family History  Problem Relation Age of Onset  . Diabetes Father   . Heart disease Father   . Heart attack Father 100  . Hyperlipidemia    . Diabetes    . Hypertension    . Seizures    . Thyroid disease    . Colon cancer Neg Hx   . Hypertension Father   . Stroke Neg Hx     Review of Systems:  As stated in the HPI and otherwise negative.   BP 102/64 mmHg  Pulse 76  Ht 5\' 10"  (1.778 m)  Wt 210 lb 6.4 oz (95.437 kg)  BMI 30.19 kg/m2  Physical Examination: General: Well developed, well nourished, NAD HEENT: OP clear, mucus membranes moist SKIN: warm, dry. No rashes. Neuro: No focal deficits Musculoskeletal: Muscle strength 5/5 all ext Psychiatric: Mood and affect normal Neck: No JVD, no carotid bruits, no thyromegaly, no lymphadenopathy. Lungs:Clear bilaterally, no wheezes, rhonci, crackles Cardiovascular: Regular rate and rhythm. Harsh systolic murmur. No gallops or rubs. Abdomen:Soft. Bowel sounds present. Non-tender.  Extremities: No lower extremity edema. Pulses are 2 + in the bilateral DP/PT.  Echo July 2016: Left ventricle: The cavity size was normal. There was mild concentric hypertrophy. Systolic function was normal. The estimated ejection fraction was in the range of 60% to 65%. Wall motion was normal; there were no regional wall motion abnormalities. There was an increased relative contribution of atrial contraction to ventricular filling. Doppler parameters are consistent with abnormal left ventricular relaxation (grade 1 diastolic dysfunction). - Aortic valve: Moderately calcified annulus. Trileaflet; mildly thickened, mildly calcified leaflets. There was very mild stenosis. - Mitral valve: Calcified  annulus. - Right ventricle: The cavity size was mildly dilated. Wall thickness was normal. - Pulmonic valve: There was trivial regurgitation.  EKG:  EKG is not ordered today. The ekg ordered today demonstrates   Recent Labs: No results found for requested labs within last 365 days.   Lipid  Panel Followed in primary care   Wt Readings from Last 3 Encounters:  08/14/15 210 lb 6.4 oz (95.437 kg)  07/10/15 211 lb 6.4 oz (95.89 kg)  01/16/15 209 lb (94.802 kg)     Other studies Reviewed: Additional studies/ records that were reviewed today include: . Review of the above records demonstrates:    Assessment and Plan:   1. CAD: Moderate disease by cath 2011. Stress myoview July 2016 without ischemia. No change in chest pressure with heavy exertion.  Continue current medical therapy.   2. HYPERTENSION: BP well controlled and on the low side. He says this started after HCTZ started. Will stop HCTZ.      3. HYPERLIPIDEMIA: He is on a statin. Lipids followed in primary care.   4. Aortic stenosis: Mild by echo July 2016. Will repeat echo in July 2018.  Current medicines are reviewed at length with the patient today.  The patient does not have concerns regarding medicines.  The following changes have been made:  no change  Labs/ tests ordered today include:   No orders of the defined types were placed in this encounter.    Disposition:   FU with me in 6 months.   Signed, [name], MD 08/14/2015 4:56 PM    [facility] [address], [place], [place]  [address] Phone: [phone]; Fax: [phone]

## 2015-08-14 NOTE — Patient Instructions (Signed)

## 2015-09-01 DIAGNOSIS — M7062 Trochanteric bursitis, left hip: Secondary | ICD-10-CM | POA: Diagnosis not present

## 2015-09-04 ENCOUNTER — Telehealth: Payer: Self-pay | Admitting: Cardiovascular Disease

## 2015-09-04 NOTE — Telephone Encounter (Signed)
Patient wanted to know if he should continue with isosorbide mono 15 mg It was time for a refill. RN informed patient to continue to take medication- patient states he has not had any chest discomfort since starting the medication in march 2017. Patient states he will have it refill.

## 2015-09-04 NOTE — Telephone Encounter (Signed)
New message'   Pt is calling to speak to rn: pt verbalized that he needs to see if he is to continue taking a particular mediatation    He is not having any medication issues and he do not need any refills or samples  He just wants Dr.McAlhany or rn to tell him if he should be taking this medication   Mononitra 30mg   Half a tablet daily

## 2015-09-29 DIAGNOSIS — R809 Proteinuria, unspecified: Secondary | ICD-10-CM | POA: Diagnosis not present

## 2015-09-29 DIAGNOSIS — I2510 Atherosclerotic heart disease of native coronary artery without angina pectoris: Secondary | ICD-10-CM | POA: Diagnosis not present

## 2015-09-29 DIAGNOSIS — R7301 Impaired fasting glucose: Secondary | ICD-10-CM | POA: Diagnosis not present

## 2015-09-29 DIAGNOSIS — E784 Other hyperlipidemia: Secondary | ICD-10-CM | POA: Diagnosis not present

## 2015-09-29 DIAGNOSIS — I10 Essential (primary) hypertension: Secondary | ICD-10-CM | POA: Diagnosis not present

## 2015-09-29 DIAGNOSIS — G4733 Obstructive sleep apnea (adult) (pediatric): Secondary | ICD-10-CM | POA: Diagnosis not present

## 2015-09-29 DIAGNOSIS — R8290 Unspecified abnormal findings in urine: Secondary | ICD-10-CM | POA: Diagnosis not present

## 2015-09-29 DIAGNOSIS — Z6830 Body mass index (BMI) 30.0-30.9, adult: Secondary | ICD-10-CM | POA: Diagnosis not present

## 2015-10-01 ENCOUNTER — Other Ambulatory Visit: Payer: Self-pay | Admitting: *Deleted

## 2015-10-01 MED ORDER — ISOSORBIDE MONONITRATE ER 30 MG PO TB24
15.0000 mg | ORAL_TABLET | Freq: Every day | ORAL | Status: DC
Start: 2015-10-01 — End: 2016-12-07

## 2015-10-28 DIAGNOSIS — I10 Essential (primary) hypertension: Secondary | ICD-10-CM | POA: Diagnosis not present

## 2015-10-28 DIAGNOSIS — Z6831 Body mass index (BMI) 31.0-31.9, adult: Secondary | ICD-10-CM | POA: Diagnosis not present

## 2015-10-28 DIAGNOSIS — R7301 Impaired fasting glucose: Secondary | ICD-10-CM | POA: Diagnosis not present

## 2015-10-28 DIAGNOSIS — I2510 Atherosclerotic heart disease of native coronary artery without angina pectoris: Secondary | ICD-10-CM | POA: Diagnosis not present

## 2015-10-28 DIAGNOSIS — E784 Other hyperlipidemia: Secondary | ICD-10-CM | POA: Diagnosis not present

## 2015-12-03 DIAGNOSIS — H524 Presbyopia: Secondary | ICD-10-CM | POA: Diagnosis not present

## 2015-12-03 DIAGNOSIS — H2512 Age-related nuclear cataract, left eye: Secondary | ICD-10-CM | POA: Diagnosis not present

## 2015-12-03 DIAGNOSIS — H52223 Regular astigmatism, bilateral: Secondary | ICD-10-CM | POA: Diagnosis not present

## 2015-12-03 DIAGNOSIS — H5203 Hypermetropia, bilateral: Secondary | ICD-10-CM | POA: Diagnosis not present

## 2016-05-06 DIAGNOSIS — J209 Acute bronchitis, unspecified: Secondary | ICD-10-CM | POA: Diagnosis not present

## 2016-05-06 DIAGNOSIS — R05 Cough: Secondary | ICD-10-CM | POA: Diagnosis not present

## 2016-05-06 DIAGNOSIS — Z6830 Body mass index (BMI) 30.0-30.9, adult: Secondary | ICD-10-CM | POA: Diagnosis not present

## 2016-05-12 ENCOUNTER — Ambulatory Visit (INDEPENDENT_AMBULATORY_CARE_PROVIDER_SITE_OTHER): Payer: Commercial Managed Care - HMO | Admitting: Orthopaedic Surgery

## 2016-05-16 ENCOUNTER — Ambulatory Visit (INDEPENDENT_AMBULATORY_CARE_PROVIDER_SITE_OTHER): Payer: Medicare HMO | Admitting: Cardiovascular Disease

## 2016-05-16 ENCOUNTER — Encounter (INDEPENDENT_AMBULATORY_CARE_PROVIDER_SITE_OTHER): Payer: Self-pay

## 2016-05-16 ENCOUNTER — Encounter: Payer: Self-pay | Admitting: Cardiovascular Disease

## 2016-05-16 VITALS — BP 124/80 | HR 65 | Ht 71.0 in | Wt 204.8 lb

## 2016-05-16 DIAGNOSIS — I10 Essential (primary) hypertension: Secondary | ICD-10-CM

## 2016-05-16 DIAGNOSIS — I25118 Atherosclerotic heart disease of native coronary artery with other forms of angina pectoris: Secondary | ICD-10-CM | POA: Diagnosis not present

## 2016-05-16 DIAGNOSIS — I350 Nonrheumatic aortic (valve) stenosis: Secondary | ICD-10-CM

## 2016-05-16 DIAGNOSIS — E7800 Pure hypercholesterolemia, unspecified: Secondary | ICD-10-CM | POA: Diagnosis not present

## 2016-05-16 NOTE — Patient Instructions (Signed)
Medication Instructions:  Your physician recommends that you continue on your current medications as directed. Please refer to the Current Medication list given to you today.   Labwork: none  Testing/Procedures: Your physician has requested that you have an echocardiogram. Echocardiography is a painless test that uses sound waves to create images of your heart. It provides your doctor with information about the size and shape of your heart and how well your heart's chambers and valves are working. This procedure takes approximately one hour. There are no restrictions for this procedure. To be done in July     Follow-Up: Your physician recommends that you schedule a follow-up appointment in: 6 months.  Please call our office in about 3 months to schedule this appointment.     Any Other Special Instructions Will Be Listed Below (If Applicable).     If you need a refill on your cardiac medications before your next appointment, please call your pharmacy.

## 2016-05-16 NOTE — Progress Notes (Signed)
Chief Complaint  Patient presents with  . Follow-up    overdue 6 months   History of Present Illness: 76 yo male with history of HTN, moderate non-obstructive CAD by cath 2011, GERD, hyperlipidemia and carotid artery disease who is here today for follow up. He had a workup 2011 for dizziness including carotid artery dopplers (mild bilateral disease, <39%) and normal echo. HCTZ and Coreg were stopped which helped his dizziness. Repeat carotid artery dopplers 04/05/12 with mild bilateral disease. Stress myoview June 2014 without ischemia. Dizziness in 2015 which improved off of HCTZ and Coreg. Stress myoview July 2016 with no ischemia. Echo July 2016 with normal LV function, mild AS. He was seen in our office March 2017 by Truitt Merle, NP and at that time noted occasional chest pain. Imdur was added.   He is here today for follow up. He has mild chest pain with heavy exertion, unchanged. He has not used NTG. No SOB, diaphoresis, n/v.   Primary Care Physician: [name], MD  Past Medical History:  Diagnosis Date  . Arthritis   . CAD (coronary artery disease)    Lexiscan Myoview 6/14: Normal study, no scar or ischemia, EF 62%  . Colon polyp   . Coronary artery disease    moderate disease by cath 2011  . Diverticulosis of colon   . GERD (gastroesophageal reflux disease)   . Hearing loss   . Hiatal hernia   . Hyperlipidemia   . Hypertension   . MRSA (methicillin resistant staph aureus) culture positive   . Seizure disorder ([place])   . Seizures ([place])    last in 1970s    Past Surgical History:  Procedure Laterality Date  . BUNIONECTOMY    . c-spine fusion    . CARPAL TUNNEL RELEASE  1994   left  . compression fracture    . error    . FOOT SURGERY     right -twice, left foot once  . HERNIA REPAIR     Umbilical with PVP  . KNEE ARTHROSCOPY     left knee  . [place]   left - scope  . NASAL SEPTUM SURGERY    . NOSE SURGERY     twice  . ROTATOR CUFF REPAIR     twice, both shoulders  . ROTATOR CUFF REPAIR    . SPINE SURGERY     C2, C3, C4  . TRIGGER FINGER RELEASE     both hands, twice  . TRIGGER FINGER RELEASE    . VASECTOMY      Current Outpatient Prescriptions  Medication Sig Dispense Refill  . aspirin 81 MG tablet Take 81 mg by mouth daily.      . cholecalciferol (VITAMIN D) 1000 units tablet Take 1,000 Units by mouth daily.    . isosorbide mononitrate (IMDUR) 30 MG 24 hr tablet Take 0.5 tablets (15 mg total) by mouth daily. 45 tablet 3  . losartan (COZAAR) 100 MG tablet Take 1 tablet by mouth Daily.    . nitroGLYCERIN (NITROSTAT) 0.4 MG SL tablet Place 1 tablet (0.4 mg total) under the tongue every 5 (five) minutes as needed for chest pain. 25 tablet 6  . omeprazole (PRILOSEC) 20 MG capsule Take 20 mg by mouth daily.      . phenytoin (DILANTIN) 100 MG ER capsule Take 200 mg by mouth 2 (two) times daily.     . rosuvastatin (CRESTOR) 40 MG tablet Take 40 mg by mouth daily.    [name] Kitchen  triamcinolone cream (KENALOG) 0.1 % Apply 1 application topically 2 (two) times daily as needed (FOR SKIN IRRITATION).      No current facility-administered medications for this visit.     Allergies  Allergen Reactions  . Ramipril     REACTION: causes cough  . Ramipril Other (See Comments)    Causes patient cough    Social History   Social History  . Marital status: Married    Spouse name: N/A  . Number of children: N/A  . Years of education: N/A   Occupational History  . Not on file.   Social History Main Topics  . Smoking status: Former Smoker    Quit date: 04/25/1969  . Smokeless tobacco: Former Systems developer  . Alcohol use 4.2 - 8.4 oz/week    7 - 14 Glasses of wine per week     Comment: socially shots of wiskey  . Drug use: No  . Sexual activity: Not on file   Other Topics Concern  . Not on file   Social History Narrative   ** Merged History Encounter **        Family History  Problem Relation Age of Onset  . Diabetes Father   . Heart  disease Father   . Heart attack Father 43  . Hyperlipidemia    . Diabetes    . Hypertension    . Seizures    . Thyroid disease    . Colon cancer Neg Hx   . Hypertension Father   . Stroke Neg Hx     Review of Systems:  As stated in the HPI and otherwise negative.   BP 124/80   Pulse 65   Ht 5\' 11"  (1.803 m)   Wt 204 lb 12.8 oz (92.9 kg)   BMI 28.56 kg/m   Physical Examination: General: Well developed, well nourished, NAD  HEENT: OP clear, mucus membranes moist  SKIN: warm, dry. No rashes. Neuro: No focal deficits  Musculoskeletal: Muscle strength 5/5 all ext  Psychiatric: Mood and affect normal  Neck: No JVD, no carotid bruits, no thyromegaly, no lymphadenopathy.  Lungs:Clear bilaterally, no wheezes, rhonci, crackles Cardiovascular: Regular rate and rhythm. Harsh systolic murmur. No gallops or rubs. Abdomen:Soft. Bowel sounds present. Non-tender.  Extremities: No lower extremity edema. Pulses are 2 + in the bilateral DP/PT.  Echo July 2016: Left ventricle: The cavity size was normal. There was mild concentric hypertrophy. Systolic function was normal. The estimated ejection fraction was in the range of 60% to 65%. Wall motion was normal; there were no regional wall motion abnormalities. There was an increased relative contribution of atrial contraction to ventricular filling. Doppler parameters are consistent with abnormal left ventricular relaxation (grade 1 diastolic dysfunction). - Aortic valve: Moderately calcified annulus. Trileaflet; mildly thickened, mildly calcified leaflets. There was very mild stenosis. - Mitral valve: Calcified annulus. - Right ventricle: The cavity size was mildly dilated. Wall thickness was normal. - Pulmonic valve: There was trivial regurgitation.  EKG:  EKG is ordered today. The ekg ordered today demonstrates NSR, rate 65 bpm. Anterior T wave inversion. Unchanged.   Recent Labs: No results found for requested  labs within last 8760 hours.   Lipid Panel Followed in primary care   Wt Readings from Last 3 Encounters:  05/16/16 204 lb 12.8 oz (92.9 kg)  08/14/15 210 lb 6.4 oz (95.4 kg)  07/10/15 211 lb 6.4 oz (95.9 kg)     Other studies Reviewed: Additional studies/ records that were reviewed today include: . Review  of the above records demonstrates:    Assessment and Plan:   1. CAD with stable angina: He has chronic stable angina. He has had no change in his chest pain. Moderate disease by cath 2011. Stress myoview July 2016 without ischemia.  Continue current medical therapy including ASA, ARB, statin, Imdur. He will call if there is any change in his chest pain and if so, we would likely repeat his cath at that time.   2. HYPERTENSION: BP well controlled. No changes.      3. HYPERLIPIDEMIA: He is on a statin. Lipids followed in primary care.   4. Aortic stenosis: Mild by echo July 2016. He has a murmur on exam. No dizziness, dyspnea or change in his chest pain. Will repeat echo in July 2018.  Current medicines are reviewed at length with the patient today.  The patient does not have concerns regarding medicines.  The following changes have been made:  no change  Labs/ tests ordered today include:   Orders Placed This Encounter  Procedures  . EKG 12-Lead  . ECHOCARDIOGRAM COMPLETE    Disposition:   FU with me in 6 months.   Signed, [name], MD 05/16/2016 3:18 PM    [facility] [address], [place], [place]  [address] Phone: [phone]; Fax: [phone]

## 2016-06-02 DIAGNOSIS — R1110 Vomiting, unspecified: Secondary | ICD-10-CM | POA: Diagnosis not present

## 2016-06-14 DIAGNOSIS — R569 Unspecified convulsions: Secondary | ICD-10-CM | POA: Diagnosis not present

## 2016-06-14 DIAGNOSIS — Z0000 Encounter for general adult medical examination without abnormal findings: Secondary | ICD-10-CM | POA: Diagnosis not present

## 2016-06-15 DIAGNOSIS — E784 Other hyperlipidemia: Secondary | ICD-10-CM | POA: Diagnosis not present

## 2016-06-15 DIAGNOSIS — R7301 Impaired fasting glucose: Secondary | ICD-10-CM | POA: Diagnosis not present

## 2016-06-15 DIAGNOSIS — I10 Essential (primary) hypertension: Secondary | ICD-10-CM | POA: Diagnosis not present

## 2016-06-15 DIAGNOSIS — Z125 Encounter for screening for malignant neoplasm of prostate: Secondary | ICD-10-CM | POA: Diagnosis not present

## 2016-06-21 DIAGNOSIS — M5489 Other dorsalgia: Secondary | ICD-10-CM | POA: Diagnosis not present

## 2016-06-21 DIAGNOSIS — R809 Proteinuria, unspecified: Secondary | ICD-10-CM | POA: Diagnosis not present

## 2016-06-21 DIAGNOSIS — R0789 Other chest pain: Secondary | ICD-10-CM | POA: Diagnosis not present

## 2016-06-21 DIAGNOSIS — I2510 Atherosclerotic heart disease of native coronary artery without angina pectoris: Secondary | ICD-10-CM | POA: Diagnosis not present

## 2016-06-21 DIAGNOSIS — R7301 Impaired fasting glucose: Secondary | ICD-10-CM | POA: Diagnosis not present

## 2016-06-21 DIAGNOSIS — G4733 Obstructive sleep apnea (adult) (pediatric): Secondary | ICD-10-CM | POA: Diagnosis not present

## 2016-06-21 DIAGNOSIS — Z0000 Encounter for general adult medical examination without abnormal findings: Secondary | ICD-10-CM | POA: Diagnosis not present

## 2016-06-21 DIAGNOSIS — I350 Nonrheumatic aortic (valve) stenosis: Secondary | ICD-10-CM | POA: Diagnosis not present

## 2016-06-21 DIAGNOSIS — Z6830 Body mass index (BMI) 30.0-30.9, adult: Secondary | ICD-10-CM | POA: Diagnosis not present

## 2016-06-21 DIAGNOSIS — I10 Essential (primary) hypertension: Secondary | ICD-10-CM | POA: Diagnosis not present

## 2016-06-21 DIAGNOSIS — E784 Other hyperlipidemia: Secondary | ICD-10-CM | POA: Diagnosis not present

## 2016-06-22 DIAGNOSIS — Z1212 Encounter for screening for malignant neoplasm of rectum: Secondary | ICD-10-CM | POA: Diagnosis not present

## 2016-06-23 DIAGNOSIS — R809 Proteinuria, unspecified: Secondary | ICD-10-CM | POA: Diagnosis not present

## 2016-06-23 DIAGNOSIS — R569 Unspecified convulsions: Secondary | ICD-10-CM | POA: Diagnosis not present

## 2016-06-27 ENCOUNTER — Ambulatory Visit: Payer: Medicare HMO | Admitting: Cardiovascular Disease

## 2016-06-29 DIAGNOSIS — K921 Melena: Secondary | ICD-10-CM | POA: Diagnosis not present

## 2016-07-05 DIAGNOSIS — K921 Melena: Secondary | ICD-10-CM | POA: Diagnosis not present

## 2016-07-13 DIAGNOSIS — R160 Hepatomegaly, not elsewhere classified: Secondary | ICD-10-CM | POA: Diagnosis not present

## 2016-07-13 DIAGNOSIS — R110 Nausea: Secondary | ICD-10-CM | POA: Diagnosis not present

## 2016-07-13 DIAGNOSIS — Z6829 Body mass index (BMI) 29.0-29.9, adult: Secondary | ICD-10-CM | POA: Diagnosis not present

## 2016-07-13 DIAGNOSIS — R609 Edema, unspecified: Secondary | ICD-10-CM | POA: Diagnosis not present

## 2016-07-14 ENCOUNTER — Other Ambulatory Visit: Payer: Self-pay | Admitting: Internal Medicine

## 2016-07-14 DIAGNOSIS — R110 Nausea: Secondary | ICD-10-CM

## 2016-07-14 DIAGNOSIS — R160 Hepatomegaly, not elsewhere classified: Secondary | ICD-10-CM

## 2016-07-18 DIAGNOSIS — R809 Proteinuria, unspecified: Secondary | ICD-10-CM | POA: Diagnosis not present

## 2016-07-18 DIAGNOSIS — R110 Nausea: Secondary | ICD-10-CM | POA: Diagnosis not present

## 2016-07-18 DIAGNOSIS — R609 Edema, unspecified: Secondary | ICD-10-CM | POA: Diagnosis not present

## 2016-07-18 DIAGNOSIS — I350 Nonrheumatic aortic (valve) stenosis: Secondary | ICD-10-CM | POA: Diagnosis not present

## 2016-07-18 DIAGNOSIS — I129 Hypertensive chronic kidney disease with stage 1 through stage 4 chronic kidney disease, or unspecified chronic kidney disease: Secondary | ICD-10-CM | POA: Diagnosis not present

## 2016-07-18 DIAGNOSIS — E785 Hyperlipidemia, unspecified: Secondary | ICD-10-CM | POA: Diagnosis not present

## 2016-07-18 DIAGNOSIS — N049 Nephrotic syndrome with unspecified morphologic changes: Secondary | ICD-10-CM | POA: Diagnosis not present

## 2016-07-27 ENCOUNTER — Other Ambulatory Visit (HOSPITAL_COMMUNITY): Payer: Self-pay | Admitting: Nephrology

## 2016-07-27 DIAGNOSIS — R809 Proteinuria, unspecified: Secondary | ICD-10-CM

## 2016-08-04 ENCOUNTER — Other Ambulatory Visit: Payer: Self-pay | Admitting: Radiology

## 2016-08-05 ENCOUNTER — Ambulatory Visit (HOSPITAL_COMMUNITY)
Admission: RE | Admit: 2016-08-05 | Discharge: 2016-08-05 | Disposition: A | Discharge status: Home / Self Care | Payer: Medicare HMO | Source: Ambulatory Visit | Attending: Nephrology | Admitting: Nephrology

## 2016-08-05 ENCOUNTER — Other Ambulatory Visit (HOSPITAL_COMMUNITY): Payer: Self-pay | Admitting: Nephrology

## 2016-08-05 ENCOUNTER — Encounter (HOSPITAL_COMMUNITY): Payer: Self-pay

## 2016-08-05 DIAGNOSIS — R809 Proteinuria, unspecified: Secondary | ICD-10-CM | POA: Insufficient documentation

## 2016-08-05 DIAGNOSIS — N189 Chronic kidney disease, unspecified: Secondary | ICD-10-CM | POA: Insufficient documentation

## 2016-08-05 DIAGNOSIS — Z7982 Long term (current) use of aspirin: Secondary | ICD-10-CM | POA: Insufficient documentation

## 2016-08-05 DIAGNOSIS — Z87891 Personal history of nicotine dependence: Secondary | ICD-10-CM | POA: Insufficient documentation

## 2016-08-05 DIAGNOSIS — Z79899 Other long term (current) drug therapy: Secondary | ICD-10-CM | POA: Insufficient documentation

## 2016-08-05 DIAGNOSIS — I129 Hypertensive chronic kidney disease with stage 1 through stage 4 chronic kidney disease, or unspecified chronic kidney disease: Secondary | ICD-10-CM | POA: Insufficient documentation

## 2016-08-05 DIAGNOSIS — E8589 Other amyloidosis: Secondary | ICD-10-CM | POA: Diagnosis not present

## 2016-08-05 LAB — CBC
HCT: 46.2 % (ref 39.0–52.0)
Hemoglobin: 16.3 g/dL (ref 13.0–17.0)
MCH: 31.6 pg (ref 26.0–34.0)
MCHC: 35.3 g/dL (ref 30.0–36.0)
MCV: 89.5 fL (ref 78.0–100.0)
PLATELETS: 261 10*3/uL (ref 150–400)
RBC: 5.16 MIL/uL (ref 4.22–5.81)
RDW: 14.5 % (ref 11.5–15.5)
WBC: 8.3 10*3/uL (ref 4.0–10.5)

## 2016-08-05 LAB — APTT: aPTT: 38 seconds — ABNORMAL HIGH (ref 24–36)

## 2016-08-05 LAB — URINALYSIS, COMPLETE (UACMP) WITH MICROSCOPIC
BACTERIA UA: NONE SEEN
BILIRUBIN URINE: NEGATIVE
Glucose, UA: NEGATIVE mg/dL
Hgb urine dipstick: NEGATIVE
KETONES UR: NEGATIVE mg/dL
Leukocytes, UA: NEGATIVE
Nitrite: NEGATIVE
SQUAMOUS EPITHELIAL / LPF: NONE SEEN
Specific Gravity, Urine: 1.017 (ref 1.005–1.030)
pH: 6 (ref 5.0–8.0)

## 2016-08-05 LAB — PROTIME-INR
INR: 1.08
Prothrombin Time: 14 seconds (ref 11.4–15.2)

## 2016-08-05 MED ORDER — LIDOCAINE-EPINEPHRINE 1 %-1:100000 IJ SOLN
INTRAMUSCULAR | Status: AC
  Filled 2016-08-05: qty 1

## 2016-08-05 MED ORDER — FENTANYL CITRATE (PF) 100 MCG/2ML IJ SOLN
INTRAMUSCULAR | Status: AC
  Filled 2016-08-05: qty 2

## 2016-08-05 MED ORDER — MIDAZOLAM HCL 2 MG/2ML IJ SOLN
INTRAMUSCULAR | Status: AC
  Filled 2016-08-05: qty 2

## 2016-08-05 MED ORDER — MIDAZOLAM HCL 2 MG/2ML IJ SOLN
INTRAMUSCULAR | Status: AC | PRN
  Administered 2016-08-05: 1 mg via INTRAVENOUS

## 2016-08-05 MED ORDER — SODIUM CHLORIDE 0.9 % IV SOLN: INTRAVENOUS | Status: DC

## 2016-08-05 MED ORDER — FENTANYL CITRATE (PF) 100 MCG/2ML IJ SOLN
INTRAMUSCULAR | Status: AC | PRN
  Administered 2016-08-05 (×2): 25 ug via INTRAVENOUS

## 2016-08-05 NOTE — Procedures (Signed)
Pre Procedure Dx: Proteinuria Post Procedural Dx: Same  Technically successful US guided biopsy of inferior pole of the right kidney.  EBL: None  No immediate complications.   Jay Marijah Larranaga, MD Pager #: [phone]    

## 2016-08-05 NOTE — Sedation Documentation (Signed)
Patient denies pain and is resting comfortably.  

## 2016-08-05 NOTE — Discharge Instructions (Signed)
Percutaneous Kidney Biopsy, Care After °This sheet gives you information about how to care for yourself after your procedure. Your health care provider may also give you more specific instructions. If you have problems or questions, contact your health care provider. °What can I expect after the procedure? °After the procedure, it is common to have: °· Pain or soreness near the area where the needle went through your skin (biopsy site). °· Bright pink or cloudy urine for 24 hours after the procedure. °Follow these instructions at home: °Activity  °· Return to your normal activities as told by your health care provider. Ask your health care provider what activities are safe for you. °· Do not drive for 24 hours if you were given a medicine to help you relax (sedative). °· Do not lift anything that is heavier than 10 lb (4.5 kg) until your health care provider tells you that it is safe. °· Avoid activities that take a lot of effort (are strenuous) until your health care provider approves. Most people will have to wait 2 weeks before returning to activities such as exercise or sexual intercourse. °General instructions  °· Take over-the-counter and prescription medicines only as told by your health care provider. °· You may eat and drink after your procedure. Follow instructions from your health care provider about eating or drinking restrictions. °· Check your biopsy site every day for signs of infection. Check for: °¨ More redness, swelling, or pain. °¨ More fluid or blood. °¨ Warmth. °¨ Pus or a bad smell. °· Keep all follow-up visits as told by your health care provider. This is important. °Contact a health care provider if: °· You have more redness, swelling, or pain around your biopsy site. °· You have more fluid or blood coming from your biopsy site. °· Your biopsy site feels warm to the touch. °· You have pus or a bad smell coming from your biopsy site. °· You have blood in your urine more than 24 hours after  your procedure. °Get help right away if: °· You have dark red or brown urine. °· You have a fever. °· You are unable to urinate. °· You feel burning when you urinate. °· You feel faint. °· You have severe pain in your abdomen or side. °This information is not intended to replace advice given to you by your health care provider. Make sure you discuss any questions you have with your health care provider. °Document Released: 12/12/2012 Document Revised: 01/22/2016 Document Reviewed: 01/22/2016 °[employer]vier Interactive Patient Education © 2017 [employer] ° °

## 2016-08-05 NOTE — H&P (Signed)
Chief Complaint: Patient was seen in consultation today for random renal biopsy at the request of [place]  Referring Physician(s): Upton,Elizabeth  Supervising Physician: Sandi Mariscal  Patient Status: [facility] - Out-pt  History of Present Illness: Alex [name]. is a 76 y.o. male   Found to have proteinuria on routine PE 05/2016 Was referred and evaluated with Dr Laurena Bering Now scheduled for random renal biopsy  Pt noticed this am for fist time blood on underwear-- presumed from penis Denies hematuria ; burning; urgency   Past Medical History:  Diagnosis Date  . Arthritis   . CAD (coronary artery disease)    Lexiscan Myoview 6/14: Normal study, no scar or ischemia, EF 62%  . Colon polyp   . Coronary artery disease    moderate disease by cath 2011  . Diverticulosis of colon   . GERD (gastroesophageal reflux disease)   . Hearing loss   . Hiatal hernia   . Hyperlipidemia   . Hypertension   . MRSA (methicillin resistant staph aureus) culture positive   . Seizure disorder ([place])   . Seizures ([place])    last in 1970s    Past Surgical History:  Procedure Laterality Date  . BUNIONECTOMY    . c-spine fusion    . CARPAL TUNNEL RELEASE  1994   left  . compression fracture    . error    . FOOT SURGERY     right -twice, left foot once  . HERNIA REPAIR     Umbilical with PVP  . KNEE ARTHROSCOPY     left knee  . [place]   left - scope  . NASAL SEPTUM SURGERY    . NOSE SURGERY     twice  . ROTATOR CUFF REPAIR     twice, both shoulders  . ROTATOR CUFF REPAIR    . SPINE SURGERY     C2, C3, C4  . TRIGGER FINGER RELEASE     both hands, twice  . TRIGGER FINGER RELEASE    . VASECTOMY      Allergies: Ramipril and Ramipril  Medications: Prior to Admission medications   Medication Sig Start Date End Date Taking? Authorizing Provider  aspirin 81 MG tablet Take 81 mg by mouth daily.     Yes Historical Provider, MD  cholecalciferol (VITAMIN D) 1000  units tablet Take 1,000 Units by mouth daily.   Yes Historical Provider, MD  furosemide (LASIX) 20 MG tablet Take 20 mg by mouth 2 (two) times daily.   Yes Historical Provider, MD  isosorbide mononitrate (IMDUR) 30 MG 24 hr tablet Take 0.5 tablets (15 mg total) by mouth daily. 10/01/15  Yes Burnell Blanks, MD  losartan (COZAAR) 100 MG tablet Take 1 tablet by mouth Daily. 11/09/10  Yes Historical Provider, MD  nitroGLYCERIN (NITROSTAT) 0.4 MG SL tablet Place 1 tablet (0.4 mg total) under the tongue every 5 (five) minutes as needed for chest pain. 07/10/15  Yes Burtis Junes, NP  omeprazole (PRILOSEC) 20 MG capsule Take 20 mg by mouth daily.     Yes Historical Provider, MD  phenytoin (DILANTIN) 100 MG ER capsule Take 200 mg by mouth 2 (two) times daily.    Yes Historical Provider, MD  potassium chloride SA (K-DUR,KLOR-CON) 20 MEQ tablet Take 20 mEq by mouth once.   Yes Historical Provider, MD  rosuvastatin (CRESTOR) 40 MG tablet Take 40 mg by mouth daily.   Yes Historical Provider, MD  triamcinolone cream (KENALOG) 0.1 %  Apply 1 application topically 2 (two) times daily as needed (FOR SKIN IRRITATION).  08/09/13  Yes Historical Provider, MD     Family History  Problem Relation Age of Onset  . Diabetes Father   . Heart disease Father   . Heart attack Father 70  . Hypertension Father   . Hyperlipidemia    . Diabetes    . Hypertension    . Seizures    . Thyroid disease    . Colon cancer Neg Hx   . Stroke Neg Hx     Social History   Social History  . Marital status: Married    Spouse name: N/A  . Number of children: N/A  . Years of education: N/A   Social History Main Topics  . Smoking status: Former Smoker    Quit date: 04/25/1969  . Smokeless tobacco: Former Systems developer  . Alcohol use 4.2 - 8.4 oz/week    7 - 14 Glasses of wine per week     Comment: socially shots of wiskey  . Drug use: No  . Sexual activity: Not Asked   Other Topics Concern  . None   Social History Narrative     ** Merged History Encounter **        Review of Systems: A 12 point ROS discussed and pertinent positives are indicated in the HPI above.  All other systems are negative.  Review of Systems  Constitutional: Negative for activity change, appetite change, diaphoresis, fatigue and fever.  Respiratory: Negative for shortness of breath.   Cardiovascular: Negative for chest pain.  Gastrointestinal: Negative for abdominal pain.  Genitourinary: Negative for difficulty urinating, dysuria, frequency, hematuria and urgency.  Musculoskeletal: Negative for back pain and gait problem.  Neurological: Negative for weakness.  Psychiatric/Behavioral: Negative for behavioral problems and confusion.    Vital Signs: BP 108/86   Pulse 64   Temp 98.1 F (36.7 C) (Oral)   Resp 16   Ht 5\' 10"  (1.778 m)   Wt 192 lb (87.1 kg)   SpO2 99%   BMI 27.55 kg/m   Physical Exam  Constitutional: He is oriented to person, place, and time. He appears well-nourished.  Cardiovascular: Normal rate and regular rhythm.   Murmur heard. Pulmonary/Chest: Effort normal and breath sounds normal. He has no wheezes.  Abdominal: Soft. Bowel sounds are normal. There is no tenderness.  Musculoskeletal: Normal range of motion.  Neurological: He is alert and oriented to person, place, and time.  Skin: Skin is warm and dry.  Psychiatric: He has a normal mood and affect. His behavior is normal. Judgment and thought content normal.  Nursing note and vitals reviewed.   Mallampati Score:  MD Evaluation Airway: WNL Heart: WNL Abdomen: WNL Chest/ Lungs: WNL ASA  Classification: 2 Mallampati/Airway Score: One  Imaging: No results found.  Labs:  CBC:  Recent Labs  08/05/16 0630  WBC 8.3  HGB 16.3  HCT 46.2  PLT 261    COAGS:  Recent Labs  08/05/16 0630  INR 1.08  APTT 38*    BMP: No results for input(s): NA, K, CL, CO2, GLUCOSE, BUN, CALCIUM, CREATININE, GFRNONAA, GFRAA in the last 8760  hours.  Invalid input(s): CMP  LIVER FUNCTION TESTS: No results for input(s): BILITOT, AST, ALT, ALKPHOS, PROT, ALBUMIN in the last 8760 hours.  TUMOR MARKERS: No results for input(s): AFPTM, CEA, CA199, CHROMGRNA in the last 8760 hours.  Assessment and Plan:  Proteinuria Scheduled for random renal biopsy today Risks and Benefits discussed with  the patient including, but not limited to bleeding, infection, damage to adjacent structures or low yield requiring additional tests. All of the patient's questions were answered, patient is agreeable to proceed. Consent signed and in chart.   Thank you for this interesting consult.  I greatly enjoyed meeting Alex [name]. and look forward to participating in their care.  A copy of this report was sent to the requesting provider on this date.  Electronically Signed: Monia Sabal A 08/05/2016, 7:18 AM   I spent a total of  30 Minutes   in face to face in clinical consultation, greater than 50% of which was counseling/coordinating care for random renal bx

## 2016-08-16 ENCOUNTER — Ambulatory Visit (INDEPENDENT_AMBULATORY_CARE_PROVIDER_SITE_OTHER): Payer: Medicare HMO | Admitting: Internal Medicine

## 2016-08-16 ENCOUNTER — Encounter: Payer: Self-pay | Admitting: Internal Medicine

## 2016-08-16 VITALS — BP 112/76 | HR 80 | Ht 71.0 in | Wt 201.8 lb

## 2016-08-16 DIAGNOSIS — Z86010 Personal history of colonic polyps: Secondary | ICD-10-CM | POA: Diagnosis not present

## 2016-08-16 DIAGNOSIS — R112 Nausea with vomiting, unspecified: Secondary | ICD-10-CM

## 2016-08-16 DIAGNOSIS — R109 Unspecified abdominal pain: Secondary | ICD-10-CM | POA: Diagnosis not present

## 2016-08-16 DIAGNOSIS — R195 Other fecal abnormalities: Secondary | ICD-10-CM

## 2016-08-16 NOTE — Patient Instructions (Signed)
We will call you as soon as possible to schedule your procedure.

## 2016-08-16 NOTE — Progress Notes (Signed)
HISTORY OF PRESENT ILLNESS:  Alex Bruna. is a 76 y.o. male with multiple medical problems as listed below. He is sent today by his primary care provider Dr. [name] with chief complaint of Hemoccult-positive stool. I last saw the patient in February 2015 for surveillance colonoscopy. Exams prior to that elsewhere. He was found to have diminutive tubular adenomas and diverticulosis. Follow-up in 5 years recommended. Patient underwent Hemoccult testing in February which was positive. Repeat testing in March was also positive. Review of outside blood work shows normal hemoglobin in February 2016 0.1. Normal MCV. Patient was having problems with edema. Found to have significant proteinuria. He tells me that he underwent kidney biopsy was diagnosed with amyloidosis. He is waiting for follow-up. Patient denies melena or hematochezia. He has had 10-12 pound weight loss over the past few months. He does report decreased appetite. Some nausea off and on since November. No vomiting. He is also had problems with postprandial mid abdominal discomfort somewhat regularly. He is on omeprazole 20 mg daily. He does use aspirin but no NSAIDs. He is accompanied today by his wife. Additional outside imaging reviewed. CT scan for questionable hepatomegaly in March was negative.  REVIEW OF SYSTEMS:  All non-GI ROS unless otherwise stated in the history of present illness negative except for lower extremity edema and fatigue  Past Medical History:  Diagnosis Date  . Amyloidosis ([place])   . Arthritis   . CAD (coronary artery disease)    Lexiscan Myoview 6/14: Normal study, no scar or ischemia, EF 62%  . Colon polyp   . Coronary artery disease    moderate disease by cath 2011  . Diverticulosis of colon   . GERD (gastroesophageal reflux disease)   . Hearing loss   . Hiatal hernia   . Hyperlipidemia   . Hypertension   . MRSA (methicillin resistant staph aureus) culture positive   . Seizure disorder ([place])   .  Seizures ([place])    last in 1970s    Past Surgical History:  Procedure Laterality Date  . BUNIONECTOMY    . c-spine fusion    . CARPAL TUNNEL RELEASE  1994   left  . compression fracture    . error    . FOOT SURGERY     right -twice, left foot once  . HERNIA REPAIR     Umbilical with PVP  . KNEE ARTHROSCOPY     left knee  . [place]   left - scope  . NASAL SEPTUM SURGERY    . NOSE SURGERY     twice  . ROTATOR CUFF REPAIR     twice, both shoulders  . ROTATOR CUFF REPAIR    . SPINE SURGERY     C2, C3, C4  . TRIGGER FINGER RELEASE     both hands, twice  . [place].  reports that he quit smoking about 47 years ago. He has quit using smokeless tobacco. He reports that he drinks about 4.2 - 8.4 oz of alcohol per week . He reports that he does not use drugs.  family history includes Diabetes in his father; Heart attack (age of onset: 108) in his father; Heart disease in his father; Hypertension in his father.  Allergies  Allergen Reactions  . Ramipril     REACTION: causes cough  . Ramipril Other (See Comments)    Causes patient cough Pt  knows as ALTACE       PHYSICAL EXAMINATION: Vital signs: BP 112/76   Pulse 80   Ht 5\' 11"  (1.803 m)   Wt 201 lb 12.8 oz (91.5 kg)   BMI 28.15 kg/m   Constitutional: generally well-appearing, no acute distress Psychiatric: Pleasant, alert and oriented x3, cooperative Eyes: extraocular movements intact, anicteric, conjunctiva pink Mouth: oral pharynx moist, no lesions Neck: supple no lymphadenopathy Cardiovascular: heart regular rate and rhythm, no murmur Lungs: clear to auscultation bilaterally Abdomen: soft, nontender, nondistended, no obvious ascites, no peritoneal signs, normal bowel sounds, no organomegaly Rectal: Deferred until colonoscopy Extremities: no clubbing or cyanosis. Trace lower extremity edema bilaterally Skin: no lesions on visible  extremities Neuro: No focal deficits. Normal DTRs. Cranial nerves intact  ASSESSMENT:  1. Hemoccult-positive stool. Rule out GI mucosal lesion 2. Problems with postprandial mid abdominal pain, nausea, decreased appetite, and weight loss. Rule out ulcer 3. Recent diagnosed amyloidosis of the kidney   PLAN:  1. Upper endoscopy to evaluate upper GI complaints listed in #2 above and evaluate Hemoccult-positive stool. Also, small bowel biopsies to rule out amyloid.The nature of the procedure, as well as the risks, benefits, and alternatives were carefully and thoroughly reviewed with the patient. Ample time for discussion and questions allowed. The patient understood, was satisfied, and agreed to proceed. 2. Colonoscopy to evaluate Hemoccult-positive stool. Also, rectal biopsies to rule out amyloid.The nature of the procedure, as well as the risks, benefits, and alternatives were carefully and thoroughly reviewed with the patient. Ample time for discussion and questions allowed. The patient understood, was satisfied, and agreed to proceed. 3. Continue PPI 4. Ongoing management of amyloidosis per his nephrologist and Gen. medical problems per Dr. [name]

## 2016-08-17 DIAGNOSIS — R110 Nausea: Secondary | ICD-10-CM | POA: Diagnosis not present

## 2016-08-17 DIAGNOSIS — N049 Nephrotic syndrome with unspecified morphologic changes: Secondary | ICD-10-CM | POA: Diagnosis not present

## 2016-08-19 ENCOUNTER — Telehealth: Payer: Self-pay | Admitting: Hematology

## 2016-08-19 ENCOUNTER — Ambulatory Visit (HOSPITAL_BASED_OUTPATIENT_CLINIC_OR_DEPARTMENT_OTHER): Payer: Medicare HMO

## 2016-08-19 ENCOUNTER — Encounter: Payer: Self-pay | Admitting: Hematology

## 2016-08-19 ENCOUNTER — Ambulatory Visit (HOSPITAL_BASED_OUTPATIENT_CLINIC_OR_DEPARTMENT_OTHER): Payer: Medicare HMO | Admitting: Hematology

## 2016-08-19 VITALS — BP 129/70 | HR 66 | Temp 97.7°F | Resp 18 | Ht 71.0 in | Wt 199.8 lb

## 2016-08-19 DIAGNOSIS — I2510 Atherosclerotic heart disease of native coronary artery without angina pectoris: Secondary | ICD-10-CM

## 2016-08-19 DIAGNOSIS — E854 Organ-limited amyloidosis: Secondary | ICD-10-CM

## 2016-08-19 DIAGNOSIS — E8581 Light chain (AL) amyloidosis: Secondary | ICD-10-CM

## 2016-08-19 DIAGNOSIS — D689 Coagulation defect, unspecified: Secondary | ICD-10-CM

## 2016-08-19 DIAGNOSIS — N049 Nephrotic syndrome with unspecified morphologic changes: Secondary | ICD-10-CM | POA: Diagnosis not present

## 2016-08-19 DIAGNOSIS — N08 Glomerular disorders in diseases classified elsewhere: Secondary | ICD-10-CM

## 2016-08-19 DIAGNOSIS — G4089 Other seizures: Secondary | ICD-10-CM | POA: Diagnosis not present

## 2016-08-19 DIAGNOSIS — K5730 Diverticulosis of large intestine without perforation or abscess without bleeding: Secondary | ICD-10-CM | POA: Diagnosis not present

## 2016-08-19 DIAGNOSIS — R109 Unspecified abdominal pain: Secondary | ICD-10-CM | POA: Diagnosis not present

## 2016-08-19 LAB — PROTIME-INR
INR: 1 — AB (ref 2.00–3.50)
PROTIME: 12 s (ref 10.6–13.4)

## 2016-08-19 LAB — COMPREHENSIVE METABOLIC PANEL WITH GFR
ALT: 18 U/L (ref 0–55)
AST: 34 U/L (ref 5–34)
Albumin: 2.4 g/dL — ABNORMAL LOW (ref 3.5–5.0)
Alkaline Phosphatase: 114 U/L (ref 40–150)
Anion Gap: 9 meq/L (ref 3–11)
BUN: 13 mg/dL (ref 7.0–26.0)
CO2: 29 meq/L (ref 22–29)
Calcium: 8.8 mg/dL (ref 8.4–10.4)
Chloride: 105 meq/L (ref 98–109)
Creatinine: 0.9 mg/dL (ref 0.7–1.3)
EGFR: 81 mL/min/{1.73_m2} — ABNORMAL LOW
Glucose: 103 mg/dL (ref 70–140)
Potassium: 4.1 meq/L (ref 3.5–5.1)
Sodium: 143 meq/L (ref 136–145)
Total Bilirubin: 0.33 mg/dL (ref 0.20–1.20)
Total Protein: 5.9 g/dL — ABNORMAL LOW (ref 6.4–8.3)

## 2016-08-19 LAB — CBC & DIFF AND RETIC
BASO%: 0.5 % (ref 0.0–2.0)
BASOS ABS: 0.1 10*3/uL (ref 0.0–0.1)
EOS%: 4.5 % (ref 0.0–7.0)
Eosinophils Absolute: 0.4 10*3/uL (ref 0.0–0.5)
HEMATOCRIT: 48.2 % (ref 38.4–49.9)
HEMOGLOBIN: 16.9 g/dL (ref 13.0–17.1)
Immature Retic Fract: 5.7 % (ref 3.00–10.60)
LYMPH%: 33.2 % (ref 14.0–49.0)
MCH: 31.3 pg (ref 27.2–33.4)
MCHC: 35.1 g/dL (ref 32.0–36.0)
MCV: 89.3 fL (ref 79.3–98.0)
MONO#: 0.8 10*3/uL (ref 0.1–0.9)
MONO%: 8.2 % (ref 0.0–14.0)
NEUT#: 5.1 10*3/uL (ref 1.5–6.5)
NEUT%: 53.6 % (ref 39.0–75.0)
NRBC: 0 % (ref 0–0)
Platelets: 273 10*3/uL (ref 140–400)
RBC: 5.4 10*6/uL (ref 4.20–5.82)
RDW: 14.4 % (ref 11.0–14.6)
Retic %: 1.64 % (ref 0.80–1.80)
Retic Ct Abs: 88.56 10*3/uL (ref 34.80–93.90)
WBC: 9.5 10*3/uL (ref 4.0–10.3)
lymph#: 3.2 10*3/uL (ref 0.9–3.3)

## 2016-08-19 LAB — LACTATE DEHYDROGENASE: LDH: 207 U/L (ref 125–245)

## 2016-08-19 NOTE — Telephone Encounter (Signed)
Labs added for today, per 08/19/16 los. Message sent to [facility] for ECHO  pre-auth. Chemo class scheduled, per 08/19/16 los. Follow up with Dr [name] was scheduled for C1D1 for 08/26/16, per 08/19/16 los. Echo to be scheduled, pending response from [facility] [place]. Patient was given a copy of the AVS report and appointment schedule per 08/19/16 los.

## 2016-08-20 LAB — HEPATITIS C ANTIBODY: Hep C Virus Ab: <0.1 s/co ratio (ref 0.0–0.9)

## 2016-08-20 LAB — APTT: aPTT: 29 s (ref 24–33)

## 2016-08-20 LAB — HEPATITIS B SURFACE ANTIGEN: HBsAg Screen: NEGATIVE

## 2016-08-20 LAB — HEPATITIS B CORE ANTIBODY, TOTAL: Hep B Core Ab, Tot: NEGATIVE

## 2016-08-20 LAB — BETA 2 MICROGLOBULIN, SERUM: BETA 2: 3 mg/L — AB (ref 0.6–2.4)

## 2016-08-21 DIAGNOSIS — E8581 Light chain (AL) amyloidosis: Secondary | ICD-10-CM | POA: Insufficient documentation

## 2016-08-21 NOTE — Progress Notes (Signed)
[name] Kitchen    HEMATOLOGY/ONCOLOGY CONSULTATION NOTE  Date of Service: 08/21/2016  Patient Care Team: [name], MD as PCP - General (Internal Medicine) Nephrology - Dr Madelon Lips MD Dr Henrene Pastor MD - GI  CHIEF COMPLAINTS/PURPOSE OF CONSULTATION:  AL Amyloidosis -newly diagnosed  HISTORY OF PRESENTING ILLNESS:   Alex [name]. is a wonderful 76 y.o. male who has been referred to [place] by Dr .[name], MD /Dr Madelon Lips MD for evaluation and management of newly diagnosed Kidney AL Amyloidosis with nephrotic syndrome.  Patient has a history of hypertension, dyslipidemia, coronary artery disease, seizure disorder on Dilantin who was apparently in his usual state of health until 3 months ago when he started developing new onset lower extremity edema. Patient had a UA at the time that showed 4+ protein in 24-hour collection revealed 15 g of protein. Additional workup showed a creatinine of 0.8 with an albumin of 2.3 and negative SPEP and UPEP. Apparently had an elevated K/L SFLC ratio of 5.19.  He was urgently referred by his primary care physician to nephrology for additional evaluation. Patient notes no bleeding issues. No nosebleeds. No periorbital bleeding. No abnormal skin rashes. Has had significant NSAID exposure and use to take a fair amount of naproxen for chronic low back pain but has cut down on this.  Due to lack of clear etiology for his nephrotic syndrome the patient underwent a kidney biopsy on 08/06/2015 accession [id number] which showed AL amyloidosis, lambda immunophenotype, [place] red positive. Noted to have severe arteriosclerosis with moderate tubular interstitial scarring.  Patient was referred to [place] for further evaluation and treatment of his AL Amyloidosis, Patient notes that his leg swelling has improved with diuretic therapy.  He notes no weight loss. No night sweats. No focal bone pains. No skin rashes. Lungs normal bleeding or bruising.  Patient notes  that he had a lot of reading online and he and his wife had an extensive list of questions which were answered in detail.  He notes he has had some chronic issues with upper abdominal pain and nausea and that he follows with Dr. Henrene Pastor and is apparently being scheduled for an EGD and colonoscopy according to his report.  Denies having and lacks tongue. No chest pain and no overt new shortness of breath. Patient is uncertain if an echocardiogram has been ordered or scheduled.   MEDICAL HISTORY:  Past Medical History:  Diagnosis Date  . Amyloidosis ([place])   . Arthritis   . CAD (coronary artery disease)    Lexiscan Myoview 6/14: Normal study, no scar or ischemia, EF 62%  . Colon polyp   . Coronary artery disease    moderate disease by cath 2011  . Diverticulosis of colon   . GERD (gastroesophageal reflux disease)   . Hearing loss   . Hiatal hernia   . Hyperlipidemia   . Hypertension   . MRSA (methicillin resistant staph aureus) culture positive   . Seizure disorder ([place])   . Seizures ([place])    last in 1970s  Obstructive sleep apnea Gastroesophageal reflux disease Aortic stenosis Erectile dysfunction Peripheral neuropathy Seizure disorder on Dilantin   SURGICAL HISTORY: Past Surgical History:  Procedure Laterality Date  . BUNIONECTOMY    . c-spine fusion    . CARPAL TUNNEL RELEASE  1994   left  . compression fracture    . error    . FOOT SURGERY     right -twice, left foot once  . HERNIA REPAIR  Umbilical with PVP  . KNEE ARTHROSCOPY     left knee  . [place]   left - scope  . NASAL SEPTUM SURGERY    . NOSE SURGERY     twice  . ROTATOR CUFF REPAIR     twice, both shoulders  . ROTATOR CUFF REPAIR    . SPINE SURGERY     C2, C3, C4  . TRIGGER FINGER RELEASE     both hands, twice  . TRIGGER FINGER RELEASE    . VASECTOMY      SOCIAL HISTORY: Social History   Social History  . Marital status: Married    Spouse name: N/A  . Number of children:  N/A  . Years of education: N/A   Occupational History  . Not on file.   Social History Main Topics  . Smoking status: Former Smoker    Quit date: 04/25/1969  . Smokeless tobacco: Former Systems developer  . Alcohol use 4.2 - 8.4 oz/week    7 - 14 Glasses of wine per week     Comment: socially shots of wiskey  . Drug use: No  . Sexual activity: Not on file   Other Topics Concern  . Not on file   Social History Narrative   ** Merged History Encounter **      Patient is currently retired.   FAMILY HISTORY: Family History  Problem Relation Age of Onset  . Diabetes Father   . Heart disease Father   . Heart attack Father 1  . Hypertension Father   . Hyperlipidemia    . Diabetes    . Hypertension    . Seizures    . Thyroid disease    . Colon cancer Neg Hx   . Stroke Neg Hx     ALLERGIES:  is allergic to ramipril and ramipril.  MEDICATIONS:  Current Outpatient Prescriptions  Medication Sig Dispense Refill  . aspirin 81 MG tablet Take 81 mg by mouth daily.      . cholecalciferol (VITAMIN D) 1000 units tablet Take 1,000 Units by mouth daily.    . furosemide (LASIX) 20 MG tablet Take 20 mg by mouth 2 (two) times daily.    . isosorbide mononitrate (IMDUR) 30 MG 24 hr tablet Take 0.5 tablets (15 mg total) by mouth daily. 45 tablet 3  . losartan (COZAAR) 100 MG tablet Take 1 tablet by mouth Daily.    . nitroGLYCERIN (NITROSTAT) 0.4 MG SL tablet Place 1 tablet (0.4 mg total) under the tongue every 5 (five) minutes as needed for chest pain. 25 tablet 6  . omeprazole (PRILOSEC) 20 MG capsule Take 20 mg by mouth daily.      . phenytoin (DILANTIN) 100 MG ER capsule Take 200 mg by mouth 2 (two) times daily.     . potassium chloride SA (K-DUR,KLOR-CON) 20 MEQ tablet Take 20 mEq by mouth once.    . rosuvastatin (CRESTOR) 40 MG tablet Take 40 mg by mouth daily.    [name] Kitchen triamcinolone cream (KENALOG) 0.1 % Apply 1 application topically 2 (two) times daily as needed (FOR SKIN IRRITATION).      No  current facility-administered medications for this visit.     REVIEW OF SYSTEMS:    10 Point review of Systems was done is negative except as noted above.  PHYSICAL EXAMINATION: ECOG PERFORMANCE STATUS: 1 - Symptomatic but completely ambulatory  . Vitals:   08/19/16 1211  BP: 129/70  Pulse: 66  Resp: 18  Temp: 97.7  F (36.5 C)   Filed Weights   08/19/16 1211  Weight: 199 lb 12.8 oz (90.6 kg)   .Body mass index is 27.87 kg/m.  GENERAL:alert, in no acute distress and comfortable SKIN: no acute rashes, no significant lesions EYES: conjunctiva are pink and non-injected, sclera anicteric OROPHARYNX: MMM, no exudates, no oropharyngeal erythema or ulceration, no macroglossia NECK: supple, mild JVD LYMPH:  no palpable lymphadenopathy in the cervical, axillary or inguinal regions LUNGS: clear to auscultation b/l with normal respiratory effort HEART: regular rate & rhythm ABDOMEN:  normoactive bowel sounds , non tender, not distended. Extremity: 1+ pitting pedal edema bilaterally PSYCH: alert & oriented x 3 with fluent speech NEURO: no focal motor/sensory deficits  LABORATORY DATA:  I have reviewed the data as listed  . CBC Latest Ref Rng & Units 08/19/2016 08/05/2016 01/18/2011  WBC 4.0 - 10.3 10e3/uL 9.5 8.3 7.4  Hemoglobin 13.0 - 17.1 g/dL 16.9 16.3 16.4  Hematocrit 38.4 - 49.9 % 48.2 46.2 44.8  Platelets 140 - 400 10e3/uL 273 261 169    . CMP Latest Ref Rng & Units 08/19/2016 01/18/2011 08/02/2010  Glucose 70 - 140 mg/dl 103 106(H) 98  BUN 7.0 - 26.0 mg/dL 13.0 16 12  Creatinine 0.7 - 1.3 mg/dL 0.9 0.73 0.96  Sodium 136 - 145 mEq/L 143 140 140  Potassium 3.5 - 5.1 mEq/L 4.1 3.7 4.4  Chloride 96 - 112 mEq/L - 105 108  CO2 22 - 29 mEq/L '29 25 27  ' Calcium 8.4 - 10.4 mg/dL 8.8 9.2 9.0  Total Protein 6.4 - 8.3 g/dL 5.9(L) - -  Total Bilirubin 0.20 - 1.20 mg/dL 0.33 - -  Alkaline Phos 40 - 150 U/L 114 - -  AST 5 - 34 U/L 34 - -  ALT 0 - 55 U/L 18 - -   Component      Latest Ref Rng & Units 08/19/2016  Protime     10.6 - 13.4 Seconds 12.0  INR     2.00 - 3.50 1.00 (L)  Lovenox      No  LDH     125 - 245 U/L 207  Beta 2     0.6 - 2.4 mg/L 3.0 (H)  APTT     24 - 33 sec 29  Hepatitis B Surface Ag     Negative Negative  Hep B Core Ab, Tot     Negative Negative  Hep C Virus Ab     0.0 - 0.9 s/co ratio <0.1      RADIOGRAPHIC STUDIES: I have personally reviewed the radiological images as listed and agreed with the findings in the report. US Guided Needle Placement  Result Date: 08/05/2016 INDICATION: Proteinuria of uncertain etiology. Please perform random renal biopsy for tissue diagnostic purposes. EXAM: ULTRASOUND GUIDED RENAL BIOPSY COMPARISON:  Abdominal ultrasound - 06/26/2015 ; CT abdomen pelvis - 09/13/2001 MEDICATIONS: None. ANESTHESIA/SEDATION: Fentanyl 50 mcg IV; Versed 1 mg IV Total Moderate Sedation time: 20 minutes; The patient was continuously monitored during the procedure by the interventional radiology nurse under my direct supervision. COMPLICATIONS: None immediate. PROCEDURE: Informed written consent was obtained from the patient after a discussion of the risks, benefits and alternatives to treatment. The patient understands and consents the procedure. A timeout was performed prior to the initiation of the procedure. Ultrasound scanning was performed of the bilateral flanks. The inferior pole of the right kidney was selected for biopsy due to location and sonographic window. The procedure was planned. The operative site was  prepped and draped in the usual sterile fashion. The overlying soft tissues were anesthetized with 1% lidocaine with epinephrine. A 17 gauge core needle biopsy device was advanced into the inferior cortex of the right kidney and 2 core biopsies were obtained under direct ultrasound guidance. Real time pathologic review confirmed adequate tissue acquisition. Images were saved for documentation purposes. The biopsy device  was removed and hemostasis was obtained with manual compression. Post procedural scanning was negative for significant post procedural hemorrhage or additional complication. A dressing was placed. The patient tolerated the procedure well without immediate post procedural complication. IMPRESSION: Technically successful ultrasound guided right renal biopsy. Electronically Signed   By: Sandi Mariscal M.D.   On: 08/05/2016 11:13   US Biopsy  Result Date: 08/05/2016 INDICATION: Proteinuria of uncertain etiology. Please perform random renal biopsy for tissue diagnostic purposes. EXAM: ULTRASOUND GUIDED RENAL BIOPSY COMPARISON:  Abdominal ultrasound - 06/26/2015 ; CT abdomen pelvis - 09/13/2001 MEDICATIONS: None. ANESTHESIA/SEDATION: Fentanyl 50 mcg IV; Versed 1 mg IV Total Moderate Sedation time: 20 minutes; The patient was continuously monitored during the procedure by the interventional radiology nurse under my direct supervision. COMPLICATIONS: None immediate. PROCEDURE: Informed written consent was obtained from the patient after a discussion of the risks, benefits and alternatives to treatment. The patient understands and consents the procedure. A timeout was performed prior to the initiation of the procedure. Ultrasound scanning was performed of the bilateral flanks. The inferior pole of the right kidney was selected for biopsy due to location and sonographic window. The procedure was planned. The operative site was prepped and draped in the usual sterile fashion. The overlying soft tissues were anesthetized with 1% lidocaine with epinephrine. A 17 gauge core needle biopsy device was advanced into the inferior cortex of the right kidney and 2 core biopsies were obtained under direct ultrasound guidance. Real time pathologic review confirmed adequate tissue acquisition. Images were saved for documentation purposes. The biopsy device was removed and hemostasis was obtained with manual compression. Post procedural  scanning was negative for significant post procedural hemorrhage or additional complication. A dressing was placed. The patient tolerated the procedure well without immediate post procedural complication. IMPRESSION: Technically successful ultrasound guided right renal biopsy. Electronically Signed   By: Sandi Mariscal M.D.   On: 08/05/2016 11:13    ASSESSMENT & PLAN:   76 year old male with above-mentioned multiple medical comorbidities with  #1 Newly diagnosed biopsy proven renal and likely systemic AL Amyloidosis Rule out associated multiple myeloma although this is less likely and only associated in about 10% of cases. Rule out cardiac AL amyloidosis #2 Newly diagnosed Nephrotic Syndrome - likely related to AL amyloidosis. As per nephrology records 24 urine showed 15 g of protein per day. Plan -Findings of biopsy and other available lab results were discussed in detail with the patient and his wife in clinic. -We discussed the diagnosis of AL amyloidosis, natural history, prognosis, initial evaluation recommended and treatment options. -We shall get updated paraproteinemia labs including myeloma panel with quantitative immunoglobulins and IFE, serum free light chain levels and ratio, LDH beta-2 microglobulin. -Whole-body skeletal survey -Baseline coag to rule out coagulopathy. -CT-guided bone marrow examination to rule out multiple myeloma and to check for other forms of systemic amyloidosis. -Stat ECHO evaluate cardiac function. BNP as a prognostic marker. -We discussed starting treatment with CyBorD - rationale of treatment, treatment regimen, potential toxicities, response evaluation. -Chemo-counseling for CyBorD on Monday/Tuesday -Wife and patient had several different questions which were answered in details. There  were given additional printed educational material to read. -Treatment plan has been built. -Patient was counseled to monitor daily weights since this will be needed for  diuretic management due to increase risk of fluid retention from the use of high-dose steroids. -patient aware that he would need transplant evaluation if he is willing to consider this. #3 abdominal discomfort- chronic along with nausea -Further evaluation per primary care physician and Dr. Perry/gastroenterology.  #4. Seizure disorder in her the patient has been on Dilantin from one 10 years. Patient knows that he has not had a seizure for very long time. Given the interaction of Dilantin with Velcade with reduction in Velcade levels. Would recommend primary care physician/neurologist consider transitioning the patient to an alternative medication that would not be an inducer of the cytochrome p450 system such as keppra.  #5 Patient Active Problem List   Diagnosis Date Noted  . Umbilical hernia 45/73/3448  . DIZZINESS 02/19/2010  . CAD, NATIVE VESSEL 07/21/2009  . CHEST PAIN-UNSPECIFIED 07/21/2009  . HYPERLIPIDEMIA 04/30/2007  . HYPERTENSION 04/30/2007  . GASTROESOPHAGEAL REFLUX DISEASE 04/30/2007  . SEIZURE DISORDER 04/30/2007  . COMPRESSION FRACTURE, L1 VERTEBRA 04/30/2007  . DIVERTICULOSIS, COLON, HX OF 04/30/2007  . Other postprocedural status(V45.89) 04/30/2007  -Continue follow-up with primary care physician for management of other medical co-morbidities.   All of the patients and his wife's questions were answered to their apparent satisfaction. They are agreeable with the plan as noted above .The patient knows to call the clinic with any problems, questions or concerns.   Labs today Skeletal survey today ECHO 08/22/2016 ASAP -cardiac Amyloidosis with dyspnea CT bone marrow aspiration/Bx - 4/30 or 08/23/2016 Chemo-counseling for CyBorD on Monday/Tuesday Starting CyBorD ASAP 08/25/2016 or 08/26/2016 RTC with Dr [name] on C1D1 with ECHO and BM Bx    I spent 65 minutes counseling the patient face to face. The total time spent in the appointment was 80 minutes and more than 50% was  on counseling and direct patient cares.    Sullivan Lone MD [place] AAHIVMS [facility] Hematology/Oncology Physician [facility]  (Office):       [phone] (Work cell):  [phone] (Fax):           [phone]  08/21/2016 12:12 PM

## 2016-08-21 NOTE — Progress Notes (Signed)
Multiple Myeloma - No Medical Intervention - Off Treatment.  Patient Characteristics: Primary Amyloidosis, First Line, Eligible for Transplant R-ISS Staging: Not Applicable Disease Classification: Primary Amyloidosis Line of therapy: First Line Transplant Eligibility: Eligible for Transplant

## 2016-08-22 LAB — KAPPA/LAMBDA LIGHT CHAINS
IG KAPPA FREE LIGHT CHAIN: 78.5 mg/L — AB (ref 3.3–19.4)
IG LAMBDA FREE LIGHT CHAIN: 15.3 mg/L (ref 5.7–26.3)
KAPPA/LAMBDA FLC RATIO: 5.13 — AB (ref 0.26–1.65)

## 2016-08-22 LAB — BRAIN NATRIURETIC PEPTIDE: BNP: 47 pg/mL (ref 0.0–100.0)

## 2016-08-23 ENCOUNTER — Encounter: Payer: Self-pay | Admitting: *Deleted

## 2016-08-23 ENCOUNTER — Other Ambulatory Visit: Payer: Self-pay | Admitting: *Deleted

## 2016-08-23 ENCOUNTER — Telehealth: Payer: Self-pay | Admitting: *Deleted

## 2016-08-23 ENCOUNTER — Other Ambulatory Visit: Payer: Medicare HMO

## 2016-08-23 DIAGNOSIS — E8581 Light chain (AL) amyloidosis: Secondary | ICD-10-CM

## 2016-08-23 MED ORDER — DEXAMETHASONE 4 MG PO TABS: ORAL_TABLET | ORAL | 3 refills | Status: DC

## 2016-08-23 MED ORDER — ONDANSETRON HCL 8 MG PO TABS: 8.0000 mg | ORAL_TABLET | Freq: Two times a day (BID) | ORAL | 1 refills | Status: DC | PRN

## 2016-08-23 MED ORDER — CYCLOPHOSPHAMIDE 50 MG PO TABS: ORAL_TABLET | ORAL | 3 refills | Status: DC

## 2016-08-23 MED ORDER — PROCHLORPERAZINE MALEATE 10 MG PO TABS: 10.0000 mg | ORAL_TABLET | Freq: Four times a day (QID) | ORAL | 1 refills | Status: DC | PRN

## 2016-08-23 MED ORDER — ACYCLOVIR 400 MG PO TABS: 400.0000 mg | ORAL_TABLET | Freq: Two times a day (BID) | ORAL | 3 refills | Status: DC

## 2016-08-23 MED ORDER — CYCLOPHOSPHAMIDE 50 MG PO TABS: 400.0000 mg/m2 | ORAL_TABLET | ORAL | 3 refills | Status: DC

## 2016-08-23 NOTE — Telephone Encounter (Signed)
Spoke with patient about calling MD [name] office to inform him of the need to change Dilantin medication due to interaction with Velcade. Patient verbalized understanding. RN faxed MD [name] office recent office note and included recommendation.

## 2016-08-23 NOTE — Telephone Encounter (Signed)
Recently spoke [name], [facility] about patients Alex [name] prescription. Instructed to send prescription to [facility]. Prescription sent. Patient informed and other chemotherapy [facility]med[facility] [place].

## 2016-08-23 NOTE — Telephone Encounter (Signed)
-----   Message from Brunetta Genera, MD sent at 08/21/2016 12:59 PM EDT ----- Duwayne Heck, COuld you ask Thurman Coyer and patient where his medications including cytoxan can be filled and release the premedications from his CyBorD orders after choosing the right pharmacy. Also plz let the patient know that we would recommend he talk to his PCP about transition off dilantin to an alternative medication like keppra (since dilantin will reduce Velcade levels significantly). I did msg the PCP regarding this but the patient should f/u on this as well.  Thanks, GK

## 2016-08-24 ENCOUNTER — Ambulatory Visit (HOSPITAL_COMMUNITY)
Admission: RE | Admit: 2016-08-24 | Discharge: 2016-08-24 | Disposition: A | Discharge status: Home / Self Care | Payer: Medicare HMO | Source: Ambulatory Visit | Attending: Cardiovascular Disease | Admitting: Cardiovascular Disease

## 2016-08-24 ENCOUNTER — Ambulatory Visit (HOSPITAL_COMMUNITY)
Admission: RE | Admit: 2016-08-24 | Discharge: 2016-08-24 | Disposition: A | Discharge status: Home / Self Care | Payer: Medicare HMO | Source: Ambulatory Visit | Attending: Hematology | Admitting: Hematology

## 2016-08-24 DIAGNOSIS — E8581 Light chain (AL) amyloidosis: Secondary | ICD-10-CM

## 2016-08-24 DIAGNOSIS — I420 Dilated cardiomyopathy: Secondary | ICD-10-CM | POA: Diagnosis not present

## 2016-08-24 DIAGNOSIS — I5030 Unspecified diastolic (congestive) heart failure: Secondary | ICD-10-CM | POA: Diagnosis not present

## 2016-08-24 DIAGNOSIS — I348 Other nonrheumatic mitral valve disorders: Secondary | ICD-10-CM | POA: Insufficient documentation

## 2016-08-24 DIAGNOSIS — M899 Disorder of bone, unspecified: Secondary | ICD-10-CM | POA: Insufficient documentation

## 2016-08-24 DIAGNOSIS — E859 Amyloidosis, unspecified: Secondary | ICD-10-CM | POA: Diagnosis not present

## 2016-08-24 DIAGNOSIS — I350 Nonrheumatic aortic (valve) stenosis: Secondary | ICD-10-CM | POA: Diagnosis not present

## 2016-08-24 DIAGNOSIS — I358 Other nonrheumatic aortic valve disorders: Secondary | ICD-10-CM | POA: Insufficient documentation

## 2016-08-24 NOTE — Progress Notes (Signed)
  Echocardiogram 2D Echocardiogram has been performed.  Alex [name] 08/24/2016, 12:00 PM

## 2016-08-25 ENCOUNTER — Telehealth: Payer: Self-pay | Admitting: Hematology

## 2016-08-25 ENCOUNTER — Encounter: Payer: Self-pay | Admitting: Hematology

## 2016-08-25 ENCOUNTER — Ambulatory Visit (HOSPITAL_BASED_OUTPATIENT_CLINIC_OR_DEPARTMENT_OTHER): Payer: Medicare HMO | Admitting: Hematology

## 2016-08-25 VITALS — BP 107/78 | HR 69 | Temp 99.1°F | Resp 18 | Ht 71.0 in | Wt 202.0 lb

## 2016-08-25 DIAGNOSIS — N049 Nephrotic syndrome with unspecified morphologic changes: Secondary | ICD-10-CM | POA: Diagnosis not present

## 2016-08-25 DIAGNOSIS — R109 Unspecified abdominal pain: Secondary | ICD-10-CM

## 2016-08-25 DIAGNOSIS — E854 Organ-limited amyloidosis: Secondary | ICD-10-CM

## 2016-08-25 DIAGNOSIS — E8581 Light chain (AL) amyloidosis: Secondary | ICD-10-CM

## 2016-08-25 DIAGNOSIS — N08 Glomerular disorders in diseases classified elsewhere: Secondary | ICD-10-CM

## 2016-08-25 LAB — MULTIPLE MYELOMA PANEL, SERUM
Albumin SerPl Elph-Mcnc: 2.4 g/dL — ABNORMAL LOW (ref 2.9–4.4)
Albumin/Glob SerPl: 0.9 (ref 0.7–1.7)
Alpha 1: 0.3 g/dL (ref 0.0–0.4)
Alpha2 Glob SerPl Elph-Mcnc: 1.3 g/dL — ABNORMAL HIGH (ref 0.4–1.0)
B-GLOBULIN SERPL ELPH-MCNC: 1.1 g/dL (ref 0.7–1.3)
GAMMA GLOB SERPL ELPH-MCNC: 0.3 g/dL — AB (ref 0.4–1.8)
Globulin, Total: 3 g/dL (ref 2.2–3.9)
IgA, Qn, Serum: 138 mg/dL (ref 61–437)
IgG, Qn, Serum: 344 mg/dL — ABNORMAL LOW (ref 700–1600)
IgM, Qn, Serum: 56 mg/dL (ref 15–143)
Total Protein: 5.4 g/dL — ABNORMAL LOW (ref 6.0–8.5)

## 2016-08-25 NOTE — Patient Instructions (Signed)
Thank you for choosing [facility] to provide your oncology and hematology care.  To afford each patient quality time with our providers, please arrive 30 minutes before your scheduled appointment time.  If you arrive late for your appointment, you may be asked to reschedule.  We strive to give you quality time with our providers, and arriving late affects you and other patients whose appointments are after yours.  If you are a no show for multiple scheduled visits, you may be dismissed from the clinic at the providers discretion.   Again, thank you for choosing [facility], our hope is that these requests will decrease the amount of time that you wait before being seen by our physicians.  ______________________________________________________________________ Should you have questions after your visit to the [facility], please contact our office at [phone] between the hours of 8:30 and 4:30 p.m.    Voicemails left after 4:30p.m will not be returned until the following business day.   For prescription refill requests, please have your pharmacy contact us directly.  Please also try to allow 48 hours for prescription requests.   Please contact the scheduling department for questions regarding scheduling.  For scheduling of procedures such as PET scans, CT scans, MRI, Ultrasound, etc please contact central scheduling at [phone].   Resources For Cancer Patients and Caregivers:  American Cancer Society:  800-227-2345  Can help patients locate various types of support and financial assistance Cancer Care: 1-800-813-HOPE (4673) Provides financial assistance, online support groups, medication/co-pay assistance.   [place] DSS:  336-641-3447 Where to apply for food stamps, Medicaid, and utility assistance Medicare Rights Center: 800-333-4114 Helps people with Medicare understand their rights and benefits, navigate the Medicare system, and secure the  quality healthcare they deserve SCAT: 336-333-6589 [place] [employer]'s shared-ride transportation service for eligible riders who have a disability that prevents them from riding the fixed route bus.   For additional information on assistance programs please contact our social worker:   Grier Hock/Abigail Elmore:  [phone] 

## 2016-08-25 NOTE — Telephone Encounter (Signed)
Per desk nurse Darden Dates, Dr [name] will see patient on 09/02/16 @ 12 noon, with labs at 11:30 a.m. Infusion was rescheduled from 2:45 pm to 1 p.m. On 09/02/16.

## 2016-08-25 NOTE — Progress Notes (Signed)
[name] Kitchen    HEMATOLOGY/ONCOLOGY CONSULTATION NOTE  Date of Service: 08/25/2016  Patient Care Team: [name], MD as PCP - General (Internal Medicine) Nephrology - Dr Madelon Lips MD Dr Henrene Pastor MD - GI  CHIEF COMPLAINTS/PURPOSE OF CONSULTATION:  AL Amyloidosis -newly diagnosed  HISTORY OF PRESENTING ILLNESS:   Alex [name]. is a wonderful 76 y.o. male who has been referred to [place] by Dr .[name], MD /Dr Madelon Lips MD for evaluation and management of newly diagnosed Kidney AL Amyloidosis with nephrotic syndrome.  Patient has a history of hypertension, dyslipidemia, coronary artery disease, seizure disorder on Dilantin who was apparently in his usual state of health until 3 months ago when he started developing new onset lower extremity edema. Patient had a UA at the time that showed 4+ protein in 24-hour collection revealed 15 g of protein. Additional workup showed a creatinine of 0.8 with an albumin of 2.3 and negative SPEP and UPEP. Apparently had an elevated K/L SFLC ratio of 5.19.  He was urgently referred by his primary care physician to nephrology for additional evaluation. Patient notes no bleeding issues. No nosebleeds. No periorbital bleeding. No abnormal skin rashes. Has had significant NSAID exposure and use to take a fair amount of naproxen for chronic low back pain but has cut down on this.  Due to lack of clear etiology for his nephrotic syndrome the patient underwent a kidney biopsy on 08/06/2015 accession [id number] which showed AL amyloidosis, lambda immunophenotype, [place] red positive. Noted to have severe arteriosclerosis with moderate tubular interstitial scarring.  Patient was referred to [place] for further evaluation and treatment of his AL Amyloidosis, Patient notes that his leg swelling has improved with diuretic therapy.  He notes no weight loss. No night sweats. No focal bone pains. No skin rashes. Lungs normal bleeding or bruising.  Patient notes  that he had a lot of reading online and he and his wife had an extensive list of questions which were answered in detail.  He notes he has had some chronic issues with upper abdominal pain and nausea and that he follows with Dr. Henrene Pastor and is apparently being scheduled for an EGD and colonoscopy according to his report.  Denies having and lacks tongue. No chest pain and no overt new shortness of breath. Patient is uncertain if an echocardiogram has been ordered or scheduled.  INTERVAL HISTORY  Mr Alex [name] is here for a scheduled follow-up. He had an echocardiogram that showed normal ejection fraction and only grade 1 diastolic dysfunction. Skeletal survey showed no overt lytic lesions suggestive of multiple myeloma. Bone marrow has been scheduled for 08/29/2016. He will start his weekly dexamethasone today and will be started on CyBorD regimen likely on Monday or Tuesday after his bone marrow biopsy. He notes that he has discussed with Dr. Hollie Salk and his primary care physician about Dilantin. He notes that he will be following up with Dr. [name] to determine that tapering plan for Dilantin and determine if he will need to switch to Keppra or another alternative medication. No other acute new symptoms at this time.  MEDICAL HISTORY:  Past Medical History:  Diagnosis Date  . Amyloidosis ([place])   . Arthritis   . CAD (coronary artery disease)    Lexiscan Myoview 6/14: Normal study, no scar or ischemia, EF 62%  . Colon polyp   . Coronary artery disease    moderate disease by cath 2011  . Diverticulosis of colon   . GERD (gastroesophageal reflux disease)   .  Hearing loss   . Hiatal hernia   . Hyperlipidemia   . Hypertension   . MRSA (methicillin resistant staph aureus) culture positive   . Seizure disorder ([place])   . Seizures ([place])    last in 1970s  Obstructive sleep apnea Gastroesophageal reflux disease Aortic stenosis Erectile dysfunction Peripheral neuropathy Seizure disorder on  Dilantin   SURGICAL HISTORY: Past Surgical History:  Procedure Laterality Date  . BUNIONECTOMY    . c-spine fusion    . CARPAL TUNNEL RELEASE  1994   left  . compression fracture    . error    . FOOT SURGERY     right -twice, left foot once  . HERNIA REPAIR     Umbilical with PVP  . KNEE ARTHROSCOPY     left knee  . [place]   left - scope  . NASAL SEPTUM SURGERY    . NOSE SURGERY     twice  . ROTATOR CUFF REPAIR     twice, both shoulders  . ROTATOR CUFF REPAIR    . SPINE SURGERY     C2, C3, C4  . TRIGGER FINGER RELEASE     both hands, twice  . TRIGGER FINGER RELEASE    . VASECTOMY      SOCIAL HISTORY: Social History   Social History  . Marital status: Married    Spouse name: N/A  . Number of children: N/A  . Years of education: N/A   Occupational History  . Not on file.   Social History Main Topics  . Smoking status: Former Smoker    Quit date: 04/25/1969  . Smokeless tobacco: Former Systems developer  . Alcohol use 4.2 - 8.4 oz/week    7 - 14 Glasses of wine per week     Comment: socially shots of wiskey  . Drug use: No  . Sexual activity: Not on file   Other Topics Concern  . Not on file   Social History Narrative   ** Merged History Encounter **      Patient is currently retired.   FAMILY HISTORY: Family History  Problem Relation Age of Onset  . Diabetes Father   . Heart disease Father   . Heart attack Father 12  . Hypertension Father   . Hyperlipidemia    . Diabetes    . Hypertension    . Seizures    . Thyroid disease    . Colon cancer Neg Hx   . Stroke Neg Hx     ALLERGIES:  is allergic to ramipril and ramipril.  MEDICATIONS:  Current Outpatient Prescriptions  Medication Sig Dispense Refill  . acyclovir (ZOVIRAX) 400 MG tablet Take 1 tablet (400 mg total) by mouth 2 (two) times daily. 60 tablet 3  . aspirin 81 MG tablet Take 81 mg by mouth daily.      . cholecalciferol (VITAMIN D) 1000 units tablet Take 1,000 Units by mouth  daily.    . cyclophosphamide (CYTOXAN) 50 MG tablet Take 17 tablets (850 mg total) by mouth. Take on days 1,8,15 of chemo 1hr before or 2hr after meals every 21 days. AL Amyloidosis ICD:E85.81 51 tablet 3  . dexamethasone (DECADRON) 4 MG tablet Take 10 tablets (40 mg) on days 1, 8, and 15 of chemo. Repeat every 21 days. 30 tablet 3  . furosemide (LASIX) 20 MG tablet Take 20 mg by mouth 2 (two) times daily.    . isosorbide mononitrate (IMDUR) 30 MG 24 hr tablet Take 0.5 tablets (  15 mg total) by mouth daily. 45 tablet 3  . losartan (COZAAR) 100 MG tablet Take 1 tablet by mouth Daily.    . nitroGLYCERIN (NITROSTAT) 0.4 MG SL tablet Place 1 tablet (0.4 mg total) under the tongue every 5 (five) minutes as needed for chest pain. 25 tablet 6  . omeprazole (PRILOSEC) 20 MG capsule Take 20 mg by mouth daily.      . ondansetron (ZOFRAN) 8 MG tablet Take 1 tablet (8 mg total) by mouth 2 (two) times daily as needed for refractory nausea / vomiting. Start on D 4 and 11. Do not take on D8. 30 tablet 1  . phenytoin (DILANTIN) 100 MG ER capsule Take 200 mg by mouth 2 (two) times daily.     . potassium chloride SA (K-DUR,KLOR-CON) 20 MEQ tablet Take 20 mEq by mouth once.    . prochlorperazine (COMPAZINE) 10 MG tablet Take 1 tablet (10 mg total) by mouth every 6 (six) hours as needed (Nausea or vomiting). 30 tablet 1  . rosuvastatin (CRESTOR) 40 MG tablet Take 40 mg by mouth daily.    [name] Kitchen triamcinolone cream (KENALOG) 0.1 % Apply 1 application topically 2 (two) times daily as needed (FOR SKIN IRRITATION).      No current facility-administered medications for this visit.     REVIEW OF SYSTEMS:    10 Point review of Systems was done is negative except as noted above.  PHYSICAL EXAMINATION: ECOG PERFORMANCE STATUS: 1 - Symptomatic but completely ambulatory  . Vitals:   08/25/16 0926  BP: 107/78  Pulse: 69  Resp: 18  Temp: 99.1 F (37.3 C)   Filed Weights   08/25/16 0926  Weight: 202 lb (91.6 kg)    .Body mass index is 28.17 kg/m.  GENERAL:alert, in no acute distress and comfortable SKIN: no acute rashes, no significant lesions EYES: conjunctiva are pink and non-injected, sclera anicteric OROPHARYNX: MMM, no exudates, no oropharyngeal erythema or ulceration, no macroglossia NECK: supple, mild JVD LYMPH:  no palpable lymphadenopathy in the cervical, axillary or inguinal regions LUNGS: clear to auscultation b/l with normal respiratory effort HEART: regular rate & rhythm ABDOMEN:  normoactive bowel sounds , non tender, not distended. Extremity: 1+ pitting pedal edema bilaterally PSYCH: alert & oriented x 3 with fluent speech NEURO: no focal motor/sensory deficits  LABORATORY DATA:  I have reviewed the data as listed  . CBC Latest Ref Rng & Units 08/19/2016 08/05/2016 01/18/2011  WBC 4.0 - 10.3 10e3/uL 9.5 8.3 7.4  Hemoglobin 13.0 - 17.1 g/dL 16.9 16.3 16.4  Hematocrit 38.4 - 49.9 % 48.2 46.2 44.8  Platelets 140 - 400 10e3/uL 273 261 169    . CMP Latest Ref Rng & Units 08/19/2016 01/18/2011 08/02/2010  Glucose 70 - 140 mg/dl 103 106(H) 98  BUN 7.0 - 26.0 mg/dL 13.0 16 12  Creatinine 0.7 - 1.3 mg/dL 0.9 0.73 0.96  Sodium 136 - 145 mEq/L 143 140 140  Potassium 3.5 - 5.1 mEq/L 4.1 3.7 4.4  Chloride 96 - 112 mEq/L - 105 108  CO2 22 - 29 mEq/L '29 25 27  ' Calcium 8.4 - 10.4 mg/dL 8.8 9.2 9.0  Total Protein 6.4 - 8.3 g/dL 5.9(L) - -  Total Bilirubin 0.20 - 1.20 mg/dL 0.33 - -  Alkaline Phos 40 - 150 U/L 114 - -  AST 5 - 34 U/L 34 - -  ALT 0 - 55 U/L 18 - -   Component     Latest Ref Rng & Units 08/19/2016  Protime     10.6 - 13.4 Seconds 12.0  INR     2.00 - 3.50 1.00 (L)  Lovenox      No  LDH     125 - 245 U/L 207  Beta 2     0.6 - 2.4 mg/L 3.0 (H)  APTT     24 - 33 sec 29  Hepatitis B Surface Ag     Negative Negative  Hep B Core Ab, Tot     Negative Negative  Hep C Virus Ab     0.0 - 0.9 s/co ratio <0.1   Component     Latest Ref Rng & Units 08/19/2016  Ig  Kappa Free Light Chain     3.3 - 19.4 mg/L 78.5 (H)  Ig Lambda Free Light Chain     5.7 - 26.3 mg/L 15.3  Kappa/Lambda FluidC Ratio     0.26 - 1.65 5.13 (H)     RADIOGRAPHIC STUDIES: I have personally reviewed the radiological images as listed and agreed with the findings in the report. US Guided Needle Placement  Result Date: 08/05/2016 INDICATION: Proteinuria of uncertain etiology. Please perform random renal biopsy for tissue diagnostic purposes. EXAM: ULTRASOUND GUIDED RENAL BIOPSY COMPARISON:  Abdominal ultrasound - 06/26/2015 ; CT abdomen pelvis - 09/13/2001 MEDICATIONS: None. ANESTHESIA/SEDATION: Fentanyl 50 mcg IV; Versed 1 mg IV Total Moderate Sedation time: 20 minutes; The patient was continuously monitored during the procedure by the interventional radiology nurse under my direct supervision. COMPLICATIONS: None immediate. PROCEDURE: Informed written consent was obtained from the patient after a discussion of the risks, benefits and alternatives to treatment. The patient understands and consents the procedure. A timeout was performed prior to the initiation of the procedure. Ultrasound scanning was performed of the bilateral flanks. The inferior pole of the right kidney was selected for biopsy due to location and sonographic window. The procedure was planned. The operative site was prepped and draped in the usual sterile fashion. The overlying soft tissues were anesthetized with 1% lidocaine with epinephrine. A 17 gauge core needle biopsy device was advanced into the inferior cortex of the right kidney and 2 core biopsies were obtained under direct ultrasound guidance. Real time pathologic review confirmed adequate tissue acquisition. Images were saved for documentation purposes. The biopsy device was removed and hemostasis was obtained with manual compression. Post procedural scanning was negative for significant post procedural hemorrhage or additional complication. A dressing was placed.  The patient tolerated the procedure well without immediate post procedural complication. IMPRESSION: Technically successful ultrasound guided right renal biopsy. Electronically Signed   By: Sandi Mariscal M.D.   On: 08/05/2016 11:13   US Biopsy  Result Date: 08/05/2016 INDICATION: Proteinuria of uncertain etiology. Please perform random renal biopsy for tissue diagnostic purposes. EXAM: ULTRASOUND GUIDED RENAL BIOPSY COMPARISON:  Abdominal ultrasound - 06/26/2015 ; CT abdomen pelvis - 09/13/2001 MEDICATIONS: None. ANESTHESIA/SEDATION: Fentanyl 50 mcg IV; Versed 1 mg IV Total Moderate Sedation time: 20 minutes; The patient was continuously monitored during the procedure by the interventional radiology nurse under my direct supervision. COMPLICATIONS: None immediate. PROCEDURE: Informed written consent was obtained from the patient after a discussion of the risks, benefits and alternatives to treatment. The patient understands and consents the procedure. A timeout was performed prior to the initiation of the procedure. Ultrasound scanning was performed of the bilateral flanks. The inferior pole of the right kidney was selected for biopsy due to location and sonographic window. The procedure was planned. The operative site was  prepped and draped in the usual sterile fashion. The overlying soft tissues were anesthetized with 1% lidocaine with epinephrine. A 17 gauge core needle biopsy device was advanced into the inferior cortex of the right kidney and 2 core biopsies were obtained under direct ultrasound guidance. Real time pathologic review confirmed adequate tissue acquisition. Images were saved for documentation purposes. The biopsy device was removed and hemostasis was obtained with manual compression. Post procedural scanning was negative for significant post procedural hemorrhage or additional complication. A dressing was placed. The patient tolerated the procedure well without immediate post procedural  complication. IMPRESSION: Technically successful ultrasound guided right renal biopsy. Electronically Signed   By: Sandi Mariscal M.D.   On: 08/05/2016 11:13   Dg Bone Survey Met  Result Date: 08/24/2016 CLINICAL DATA:  AL amyloidosis. Staging myeloma. Hx arthritis, CAD, htn. Former Investment banker, operational. Hx spine sugery-C2-C4, bilateral rotator cuff repair, nasal septum surgery, umbilical hernia repair with PVP, compression fx EXAM: METASTATIC BONE SURVEY COMPARISON:  Cervical spine radiographs, 05/29/2014. Chest radiograph, 01/18/2011. FINDINGS: There is a lucent cortical lesion in the left fibular diaphysis. There are no other lucent/ lytic bone lesions in no osteoblastic bone lesions. Stable compression fracture of L1 unchanged from the chest radiograph, 01/18/2011. There are stable changes from a bony fusion at C5 through C7. Changes from bilateral rotator cuff surgery are also unchanged. Bones are diffusely demineralized. IMPRESSION: 1. There is a single lucent lesion within the cortex of the left fibular diaphysis. This may reflect a benign lesion such as fibrous dysplasia. This is favored given that it is solitary and given the location. Multiple myeloma is possible. 2. There are no other lucent/lytic bone lesions. There are no osteoblastic bone lesions. No other findings to suggest neoplastic disease of bone. Electronically Signed   By: Lajean Manes M.D.   On: 08/24/2016 14:10   Result status: Edited Result - FINAL                              *[facility] [employer].                        [place], [place] [address]                            [phone]  ------------------------------------------------------------------- Transthoracic Echocardiography  (Report amended )  Patient:    Alex [name], Alex [name] MR #:       [id number] Study Date: 08/24/2016 Gender:     M Age:        87 Height:     180.3 cm Weight:      90.6 kg BSA:        2.15 m^2 Pt. Status: Room:   ATTENDING    Darlina Guys, MD  Willia Craze, Christopher  REFERRING    [place] [place], Out[facility]  SONOGRAPHER  Roseanna Rainbow  cc:  ------------------------------------------------------------------- LV EF: 65% -   70%  ------------------------------------------------------------------- Indications:      Aortic stenosis 424.1.  ------------------------------------------------------------------- History:   PMH:  Amyloidosis.  Coronary artery disease.  Angina pectoris.  Risk factors:  Hypertension. Dyslipidemia.  ------------------------------------------------------------------- Study Conclusions  - Left ventricle: The cavity size was normal. Wall thickness was   increased in a pattern of mild LVH. Systolic function was   vigorous. The estimated ejection fraction was in the range of 65%   to 70%. Wall motion was normal; there were no regional wall   motion abnormalities. Doppler parameters are consistent with   abnormal left ventricular relaxation (grade 1 diastolic   dysfunction). - Aortic valve: Valve mobility was mildly restricted. - Aortic root: The aortic root was mildly dilated. - Mitral valve: Calcified annulus.  Impressions:  - Vigorous LV systolic function; mild diastolic dysfunction; mild   LVH; calcified aortic valve with no significant AS (peak velocity   2 m/s; mean gradient 9 mmHg); mildly dilated aortic root.    ASSESSMENT & PLAN:   76 year old male with above-mentioned multiple medical comorbidities with  #1 Newly diagnosed biopsy proven renal and likely systemic AL Amyloidosis Rule out associated multiple myeloma although this is less likely and only associated in about 10% of cases. Echo shows normal systolic function with only grade 1 diastolic dysfunction. Skeletal survey with no lytic lesions suggestive of multiple myeloma. Serum K/L ratio increased at  about 5.13. #2 Newly diagnosed Nephrotic Syndrome - likely related to AL amyloidosis. As per nephrology records 24 urine showed 15 g of protein per day. Plan -Patient has completed chemotherapy counseling for his CyBorD  -He is due to get his bone marrow biopsy on 08/29/2016 . -He will be started on his dexamethasone today in the interest of renal preservation . -He will be started on his CyBOrD regimen on Monday after bone marrow biopsy . We do not to wait for the results of the bone marrow biopsy since it would likely not change immediate treatment at this time . -Patient was counseled to monitor daily weights since this will be needed for diuretic management due to increase risk of fluid retention from the use of high-dose steroids. -He will follow-up with his primary care physician to define the final plan regarding his seizure medications.  #3 abdominal discomfort- chronic along with nausea. -Further evaluation per primary care physician and Dr. Perry/gastroenterology. -Would like to rule out peptic ulcer disease/gastric amyloidosis/other etiology since the patient is going to be on high-dose steroids which can make ulcers worse.  #4. Seizure disorder in her the patient has been on Dilantin from one 10 years. Patient knows that he has not had a seizure for very long time. -Given the interaction of Dilantin with Velcade with reduction in Velcade levels.  -Patient is following up with his primary care physician regarding transitioning off Dilantin possibly to a different medication such as Keppra that would not interact with his Velcade.  #5 Patient Active Problem List   Diagnosis Date Noted  . AL amyloidosis ([place]) 08/21/2016  . Umbilical hernia 04/27/7251  . DIZZINESS 02/19/2010  . CAD, NATIVE VESSEL 07/21/2009  . CHEST PAIN-UNSPECIFIED 07/21/2009  . HYPERLIPIDEMIA 04/30/2007  . HYPERTENSION 04/30/2007  . GASTROESOPHAGEAL REFLUX DISEASE 04/30/2007  . SEIZURE DISORDER 04/30/2007  .  COMPRESSION FRACTURE, L1 VERTEBRA 04/30/2007  . DIVERTICULOSIS, COLON, HX OF 04/30/2007  . Other postprocedural status(V45.89) 04/30/2007  -Continue follow-up with primary care physician for management of other medical co-morbidities.  BM Bx as scheduled on 5/7 Start CyBorD on 08/29/2016 after BM Bx(we do not need to wait for results) RTC with Dr [name] D8 of treatment with labs for toxicity check   All  of the patients and his wife's questions were answered to their apparent satisfaction. They are agreeable with the plan as noted above .The patient knows to call the clinic with any problems, questions or concerns.  I spent 25 minutes counseling the patient face to face. The total time spent in the appointment was 30 minutes and more than 50% was on counseling and direct patient cares.    Sullivan Lone MD [place] AAHIVMS [facility] Hematology/Oncology Physician [facility]  (Office):       [phone] (Work cell):  [phone] (Fax):           [phone]  08/25/2016 12:09 PM

## 2016-08-25 NOTE — Telephone Encounter (Signed)
Message sent to [facility] for Chemo to be added on 08/29/16. Chemo scheduled according to Care Plan. Care Plan also confirmed with Desk Nurse/LaShonya. Dr [name] has no availability on 09/02/16, message sent to Dr [name] for follow up appointment to check toxicity.  Patient was given a copy of the AVS report and appointment schedule per 08/25/16 los.

## 2016-08-26 ENCOUNTER — Other Ambulatory Visit: Payer: Self-pay | Admitting: General Surgery

## 2016-08-26 ENCOUNTER — Encounter (HOSPITAL_COMMUNITY): Payer: Self-pay

## 2016-08-29 ENCOUNTER — Ambulatory Visit (HOSPITAL_COMMUNITY)
Admission: RE | Admit: 2016-08-29 | Discharge: 2016-08-29 | Disposition: A | Discharge status: Home / Self Care | Payer: Medicare HMO | Source: Ambulatory Visit | Attending: Hematology | Admitting: Hematology

## 2016-08-29 ENCOUNTER — Telehealth: Payer: Self-pay | Admitting: Hematology

## 2016-08-29 ENCOUNTER — Ambulatory Visit (HOSPITAL_BASED_OUTPATIENT_CLINIC_OR_DEPARTMENT_OTHER): Payer: Medicare HMO

## 2016-08-29 ENCOUNTER — Encounter (HOSPITAL_COMMUNITY): Payer: Self-pay

## 2016-08-29 ENCOUNTER — Telehealth: Payer: Self-pay | Admitting: Pharmacist

## 2016-08-29 DIAGNOSIS — Z5112 Encounter for antineoplastic immunotherapy: Secondary | ICD-10-CM

## 2016-08-29 DIAGNOSIS — E8581 Light chain (AL) amyloidosis: Secondary | ICD-10-CM | POA: Insufficient documentation

## 2016-08-29 DIAGNOSIS — D72822 Plasmacytosis: Secondary | ICD-10-CM | POA: Diagnosis not present

## 2016-08-29 DIAGNOSIS — E8589 Other amyloidosis: Secondary | ICD-10-CM | POA: Diagnosis not present

## 2016-08-29 LAB — CBC WITH DIFFERENTIAL/PLATELET
BASOS ABS: 0 10*3/uL (ref 0.0–0.1)
Basophils Relative: 0 %
EOS PCT: 8 %
Eosinophils Absolute: 0.7 10*3/uL (ref 0.0–0.7)
HCT: 45.3 % (ref 39.0–52.0)
Hemoglobin: 15.8 g/dL (ref 13.0–17.0)
LYMPHS ABS: 3.1 10*3/uL (ref 0.7–4.0)
LYMPHS PCT: 34 %
MCH: 31.2 pg (ref 26.0–34.0)
MCHC: 34.9 g/dL (ref 30.0–36.0)
MCV: 89.3 fL (ref 78.0–100.0)
Monocytes Absolute: 0.7 10*3/uL (ref 0.1–1.0)
Monocytes Relative: 8 %
Neutro Abs: 4.6 10*3/uL (ref 1.7–7.7)
Neutrophils Relative %: 50 %
PLATELETS: 256 10*3/uL (ref 150–400)
RBC: 5.07 MIL/uL (ref 4.22–5.81)
RDW: 14.3 % (ref 11.5–15.5)
WBC: 9.2 10*3/uL (ref 4.0–10.5)

## 2016-08-29 LAB — APTT: aPTT: 37 seconds — ABNORMAL HIGH (ref 24–36)

## 2016-08-29 LAB — PROTIME-INR
INR: 1.03
Prothrombin Time: 13.6 seconds (ref 11.4–15.2)

## 2016-08-29 MED ORDER — MIDAZOLAM HCL 2 MG/2ML IJ SOLN
INTRAMUSCULAR | Status: AC | PRN
  Administered 2016-08-29 (×2): 1 mg via INTRAVENOUS

## 2016-08-29 MED ORDER — NALOXONE HCL 0.4 MG/ML IJ SOLN
INTRAMUSCULAR | Status: AC
  Filled 2016-08-29: qty 1

## 2016-08-29 MED ORDER — DEXAMETHASONE 4 MG PO TABS
40.0000 mg | ORAL_TABLET | Freq: Once | ORAL | Status: AC
  Administered 2016-08-29: 40 mg via ORAL

## 2016-08-29 MED ORDER — MIDAZOLAM HCL 2 MG/2ML IJ SOLN
INTRAMUSCULAR | Status: AC
  Filled 2016-08-29: qty 6

## 2016-08-29 MED ORDER — ONDANSETRON HCL 8 MG PO TABS
8.0000 mg | ORAL_TABLET | Freq: Once | ORAL | Status: AC
  Administered 2016-08-29: 8 mg via ORAL

## 2016-08-29 MED ORDER — FENTANYL CITRATE (PF) 100 MCG/2ML IJ SOLN
INTRAMUSCULAR | Status: AC | PRN
  Administered 2016-08-29 (×2): 50 ug via INTRAVENOUS

## 2016-08-29 MED ORDER — SODIUM CHLORIDE 0.9 % IV SOLN
INTRAVENOUS | Status: DC
  Administered 2016-08-29: 08:00:00 via INTRAVENOUS

## 2016-08-29 MED ORDER — ONDANSETRON HCL 8 MG PO TABS
ORAL_TABLET | ORAL | Status: AC
Start: 2016-08-29 — End: ?
  Filled 2016-08-29: qty 1

## 2016-08-29 MED ORDER — FLUMAZENIL 0.5 MG/5ML IV SOLN
INTRAVENOUS | Status: AC
  Filled 2016-08-29: qty 5

## 2016-08-29 MED ORDER — FENTANYL CITRATE (PF) 100 MCG/2ML IJ SOLN
INTRAMUSCULAR | Status: AC
  Filled 2016-08-29: qty 4

## 2016-08-29 MED ORDER — DEXAMETHASONE 4 MG PO TABS
ORAL_TABLET | ORAL | Status: AC
  Filled 2016-08-29: qty 10

## 2016-08-29 MED ORDER — BORTEZOMIB CHEMO SQ INJECTION 3.5 MG (2.5MG/ML)
1.3000 mg/m2 | Freq: Once | INTRAMUSCULAR | Status: AC
  Administered 2016-08-29: 2.75 mg via SUBCUTANEOUS
  Filled 2016-08-29: qty 2.75

## 2016-08-29 NOTE — Discharge Instructions (Signed)
Bone Marrow Aspiration and Bone Marrow Biopsy, Adult, Care After This sheet gives you information about how to care for yourself after your procedure. Your health care provider may also give you more specific instructions. If you have problems or questions, contact your health care provider. What can I expect after the procedure? After the procedure, it is common to have:  Mild pain and tenderness.  Swelling.  Bruising. Follow these instructions at home:  Take over-the-counter or prescription medicines only as told by your health care provider.  Do not take baths, swim, or use a hot tub until your health care provider approves. Ask if you can take a shower or have a sponge bath.  Follow instructions from your health care provider about how to take care of the puncture site. Make sure you:  Wash your hands with soap and water before you change your bandage (dressing). If soap and water are not available, use hand sanitizer.  Change your dressing as told by your health care provider.  Check your puncture siteevery day for signs of infection. Check for:  More redness, swelling, or pain.  More fluid or blood.  Warmth.  Pus or a bad smell.  You may remove dressing tomorrow.  Tuesday 08/29/16.  You may shower tomorrow  Tuesday 08/29/16.   Return to your normal activities as told by your health care provider. Ask your health care provider what activities are safe for you.  Do not drive for 24 hours if you were given a medicine to help you relax (sedative).  Keep all follow-up visits as told by your health care provider. This is important. Contact a health care provider if:  You have more redness, swelling, or pain around the puncture site.  You have more fluid or blood coming from the puncture site.  Your puncture site feels warm to the touch.  You have pus or a bad smell coming from the puncture site.  You have a fever.  Your pain is not controlled with medicine. This  information is not intended to replace advice given to you by your health care provider. Make sure you discuss any questions you have with your health care provider. Document Released: 10/29/2004 Document Revised: 10/30/2015 Document Reviewed: 09/23/2015 Elsevier Interactive Patient Education  2017 [employer]. Moderate Conscious Sedation, Adult, Care After These instructions provide you with information about caring for yourself after your procedure. Your health care provider may also give you more specific instructions. Your treatment has been planned according to current medical practices, but problems sometimes occur. Call your health care provider if you have any problems or questions after your procedure. What can I expect after the procedure? After your procedure, it is common:  To feel sleepy for several hours.  To feel clumsy and have poor balance for several hours.  To have poor judgment for several hours.  To vomit if you eat too soon. Follow these instructions at home: For at least 24 hours after the procedure:    Do not:  Participate in activities where you could fall or become injured.  Drive.  Use heavy machinery.  Drink alcohol.  Take sleeping pills or medicines that cause drowsiness.  Make important decisions or sign legal documents.  Take care of children on your own.  Rest. Eating and drinking   Follow the diet recommended by your health care provider.  If you vomit:  Drink water, juice, or soup when you can drink without vomiting.  Make sure you have little or no  nausea before eating solid foods. General instructions   Have a responsible adult stay with you until you are awake and alert.  Take over-the-counter and prescription medicines only as told by your health care provider.  If you smoke, do not smoke without supervision.  Keep all follow-up visits as told by your health care provider. This is important. Contact a health care provider  if:  You keep feeling nauseous or you keep vomiting.  You feel light-headed.  You develop a rash.  You have a fever. Get help right away if:  You have trouble breathing. This information is not intended to replace advice given to you by your health care provider. Make sure you discuss any questions you have with your health care provider. Document Released: 01/30/2013 Document Revised: 09/14/2015 Document Reviewed: 08/01/2015 Elsevier Interactive Patient Education  2017 [employer].

## 2016-08-29 NOTE — Telephone Encounter (Signed)
R/s appt for patient per schedule message from [facility] 5/7

## 2016-08-29 NOTE — Progress Notes (Signed)
Per Dr. [name], okay to treat with only CBC today. No CMET needed.

## 2016-08-29 NOTE — Progress Notes (Signed)
At end of infusion apt, patient and his wife unsure whether they have Cytoxan at home. Per Ander Purpura, RN via Dr. [name], [facility] outpatient pharm to work on prior-auth and patient to begin on next scheduled time (day 8). This RN instructed patient to begin cytoxan on day 8 with next dose of decadron. Reviewed home meds and answered his questions. He verbalizes understanding. He was monitored for 30 minutes post injection without complaints or concerns and discharged home in stable condition.

## 2016-08-29 NOTE — Telephone Encounter (Signed)
Oral Chemotherapy Pharmacist Encounter  Received notification from [facility] that patient's cyclophosphamide prescription would require prior authorization Cyclophosphamide will be used in combination with Velcade and dexamethasone for AL Amyloidosis  4/27 and 5/7 labs reviewed, OK for treatment Current medication list in Epic assessed, no significant DDIs with cyclophosphamide identified  PA submitted on CoverMyMeds Key [id number] Status is pending  Oral oncology Clinic will continue to follow.  Johny Drilling, PharmD, BCPS, BCOP 08/29/2016  4:26 PM Oral Oncology Clinic [phone]

## 2016-08-29 NOTE — Patient Instructions (Signed)
[facility] Discharge Instructions for Patients Receiving Chemotherapy  Today you received the following chemotherapy agents: Velcade (injection)  You received Dexamethasone (steroid) in the infusion room. Do not take Dexamethasone again today. Take Cytoxan (oral chemo pill) as prescribed when you get home today, either 1 hour before or 2 hours after a meal.   To help prevent nausea and vomiting after your treatment, we encourage you to take your nausea medication.  You received Zofran in the infusion room, you may take this after 8 pm if needed. You may take Compazine every 6 hours as needed for nausea. This medication may make you drowsy.   If you develop nausea and vomiting that is not controlled by your nausea medication, call the clinic.   BELOW ARE SYMPTOMS THAT SHOULD BE REPORTED IMMEDIATELY:  *FEVER GREATER THAN 100.5 F  *CHILLS WITH OR WITHOUT FEVER  NAUSEA AND VOMITING THAT IS NOT CONTROLLED WITH YOUR NAUSEA MEDICATION  *UNUSUAL SHORTNESS OF BREATH  *UNUSUAL BRUISING OR BLEEDING  TENDERNESS IN MOUTH AND THROAT WITH OR WITHOUT PRESENCE OF ULCERS  *URINARY PROBLEMS  *BOWEL PROBLEMS  UNUSUAL RASH Items with * indicate a potential emergency and should be followed up as soon as possible.  Feel free to call the clinic you have any questions or concerns. The clinic phone number is [phone].  Please show the [facility] at check-in to the Emergency Department and triage nurse.

## 2016-08-29 NOTE — Procedures (Signed)
CT-guided  R iliac bone marrow aspiration and core biopsy No complication No blood loss. See complete dictation in [facility] PACS  

## 2016-08-30 DIAGNOSIS — D72822 Plasmacytosis: Secondary | ICD-10-CM | POA: Diagnosis not present

## 2016-08-30 MED ORDER — CYCLOPHOSPHAMIDE 50 MG PO TABS: ORAL_TABLET | ORAL | 3 refills | Status: DC

## 2016-08-30 NOTE — Telephone Encounter (Signed)
Oral Chemotherapy Pharmacist Encounter  Received notification from [place] that patient's insurance [facility]) is requiring cyclophosphamide prescription be filled through [employer].  Cyclophosphamide prescription e-scribed to [facility] in [place], [facility] will continue to follow.  Johny Drilling, PharmD, BCPS, BCOP 08/30/2016  8:28 AM Oral Oncology Clinic [phone]

## 2016-08-31 ENCOUNTER — Other Ambulatory Visit: Payer: Self-pay | Admitting: *Deleted

## 2016-08-31 ENCOUNTER — Encounter: Payer: Self-pay | Admitting: Internal Medicine

## 2016-08-31 DIAGNOSIS — E8581 Light chain (AL) amyloidosis: Secondary | ICD-10-CM

## 2016-09-01 ENCOUNTER — Other Ambulatory Visit (HOSPITAL_BASED_OUTPATIENT_CLINIC_OR_DEPARTMENT_OTHER): Payer: Medicare HMO

## 2016-09-01 ENCOUNTER — Ambulatory Visit (HOSPITAL_BASED_OUTPATIENT_CLINIC_OR_DEPARTMENT_OTHER): Payer: Medicare HMO

## 2016-09-01 VITALS — BP 108/64 | HR 67 | Temp 96.9°F | Resp 16

## 2016-09-01 DIAGNOSIS — E8581 Light chain (AL) amyloidosis: Secondary | ICD-10-CM

## 2016-09-01 DIAGNOSIS — Z5112 Encounter for antineoplastic immunotherapy: Secondary | ICD-10-CM | POA: Diagnosis not present

## 2016-09-01 LAB — CBC WITH DIFFERENTIAL/PLATELET
BASO%: 0.8 % (ref 0.0–2.0)
Basophils Absolute: 0.1 10*3/uL (ref 0.0–0.1)
EOS ABS: 0.4 10*3/uL (ref 0.0–0.5)
EOS%: 3.8 % (ref 0.0–7.0)
HEMATOCRIT: 47 % (ref 38.4–49.9)
HGB: 16.1 g/dL (ref 13.0–17.1)
LYMPH#: 3.7 10*3/uL — AB (ref 0.9–3.3)
LYMPH%: 37.6 % (ref 14.0–49.0)
MCH: 31.1 pg (ref 27.2–33.4)
MCHC: 34.2 g/dL (ref 32.0–36.0)
MCV: 90.8 fL (ref 79.3–98.0)
MONO#: 1 10*3/uL — AB (ref 0.1–0.9)
MONO%: 10.2 % (ref 0.0–14.0)
NEUT%: 47.6 % (ref 39.0–75.0)
NEUTROS ABS: 4.6 10*3/uL (ref 1.5–6.5)
PLATELETS: 254 10*3/uL (ref 140–400)
RBC: 5.18 10*6/uL (ref 4.20–5.82)
RDW: 14.4 % (ref 11.0–14.6)
WBC: 9.7 10*3/uL (ref 4.0–10.3)

## 2016-09-01 LAB — COMPREHENSIVE METABOLIC PANEL
ALBUMIN: 2.3 g/dL — AB (ref 3.5–5.0)
ALT: 21 U/L (ref 0–55)
ANION GAP: 7 meq/L (ref 3–11)
AST: 34 U/L (ref 5–34)
Alkaline Phosphatase: 99 U/L (ref 40–150)
BILIRUBIN TOTAL: 0.32 mg/dL (ref 0.20–1.20)
BUN: 16.6 mg/dL (ref 7.0–26.0)
CALCIUM: 8.4 mg/dL (ref 8.4–10.4)
CO2: 29 mEq/L (ref 22–29)
CREATININE: 1 mg/dL (ref 0.7–1.3)
Chloride: 104 mEq/L (ref 98–109)
EGFR: 75 mL/min/{1.73_m2} — ABNORMAL LOW (ref >90)
Glucose: 116 mg/dl (ref 70–140)
Potassium: 4.7 mEq/L (ref 3.5–5.1)
Sodium: 140 mEq/L (ref 136–145)
TOTAL PROTEIN: 5.4 g/dL — AB (ref 6.4–8.3)

## 2016-09-01 MED ORDER — PROCHLORPERAZINE MALEATE 10 MG PO TABS
ORAL_TABLET | ORAL | Status: AC
  Filled 2016-09-01: qty 1

## 2016-09-01 MED ORDER — BORTEZOMIB CHEMO SQ INJECTION 3.5 MG (2.5MG/ML)
1.3000 mg/m2 | Freq: Once | INTRAMUSCULAR | Status: AC
  Administered 2016-09-01: 2.75 mg via SUBCUTANEOUS
  Filled 2016-09-01: qty 2.75

## 2016-09-01 MED ORDER — PROCHLORPERAZINE MALEATE 10 MG PO TABS
10.0000 mg | ORAL_TABLET | Freq: Once | ORAL | Status: AC
  Administered 2016-09-01: 10 mg via ORAL

## 2016-09-01 NOTE — Patient Instructions (Signed)
[facility] Discharge Instructions for Patients Receiving Chemotherapy  Today you received the following chemotherapy agents VELCADE  To help prevent nausea and vomiting after your treatment, we encourage you to take your nausea medicationas prescribed.  If you develop nausea and vomiting that is not controlled by your nausea medication, call the clinic.   BELOW ARE SYMPTOMS THAT SHOULD BE REPORTED IMMEDIATELY:  *FEVER GREATER THAN 100.5 F  *CHILLS WITH OR WITHOUT FEVER  NAUSEA AND VOMITING THAT IS NOT CONTROLLED WITH YOUR NAUSEA MEDICATION  *UNUSUAL SHORTNESS OF BREATH  *UNUSUAL BRUISING OR BLEEDING  TENDERNESS IN MOUTH AND THROAT WITH OR WITHOUT PRESENCE OF ULCERS  *URINARY PROBLEMS  *BOWEL PROBLEMS  UNUSUAL RASH Items with * indicate a potential emergency and should be followed up as soon as possible.  Feel free to call the clinic you have any questions or concerns. The clinic phone number is [phone].  Please show the [facility] at check-in to the Emergency Department and triage nurse.     Bortezomib injection What is this medicine? BORTEZOMIB (bor TEZ oh mib) is a medicine that targets proteins in cancer cells and stops the cancer cells from growing. It is used to treat multiple myeloma and mantle-cell lymphoma. This medicine may be used for other purposes; ask your health care provider or pharmacist if you have questions. COMMON BRAND NAME(S): Velcade What should I tell my health care provider before I take this medicine? They need to know if you have any of these conditions: -diabetes -heart disease -irregular heartbeat -liver disease -on hemodialysis -low blood counts, like low white blood cells, platelets, or hemoglobin -peripheral neuropathy -taking medicine for blood pressure -an unusual or allergic reaction to bortezomib, mannitol, boron, other medicines, foods, dyes, or preservatives -pregnant or trying to get  pregnant -breast-feeding How should I use this medicine? This medicine is for injection into a vein or for injection under the skin. It is given by a health care professional in a hospital or clinic setting. Talk to your pediatrician regarding the use of this medicine in children. Special care may be needed. Overdosage: If you think you have taken too much of this medicine contact a poison control center or emergency room at once. NOTE: This medicine is only for you. Do not share this medicine with others. What if I miss a dose? It is important not to miss your dose. Call your doctor or health care professional if you are unable to keep an appointment. What may interact with this medicine? This medicine may interact with the following medications: -ketoconazole -rifampin -ritonavir -St. John's Wort This list may not describe all possible interactions. Give your health care provider a list of all the medicines, herbs, non-prescription drugs, or dietary supplements you use. Also tell them if you smoke, drink alcohol, or use illegal drugs. Some items may interact with your medicine. What should I watch for while using this medicine? You may get drowsy or dizzy. Do not drive, use machinery, or do anything that needs mental alertness until you know how this medicine affects you. Do not stand or sit up quickly, especially if you are an older patient. This reduces the risk of dizzy or fainting spells. In some cases, you may be given additional medicines to help with side effects. Follow all directions for their use. Call your doctor or health care professional for advice if you get a fever, chills or sore throat, or other symptoms of a cold or flu. Do not treat  treat yourself. This drug decreases your body's ability to fight infections. Try to avoid being around people who are sick. This medicine may increase your risk to bruise or bleed. Call your doctor or health care professional if you notice any unusual  bleeding. You may need blood work done while you are taking this medicine. In some patients, this medicine may cause a serious brain infection that may cause death. If you have any problems seeing, thinking, speaking, walking, or standing, tell your doctor right away. If you cannot reach your doctor, urgently seek other source of medical care. Check with your doctor or health care professional if you get an attack of severe diarrhea, nausea and vomiting, or if you sweat a lot. The loss of too much body fluid can make it dangerous for you to take this medicine. Do not become pregnant while taking this medicine or for at least 2 months after stopping it. Women should inform their doctor if they wish to become pregnant or think they might be pregnant. Men should not father a child while taking this medicine and for at least 2 months after stopping it. There is a potential for serious side effects to an unborn child. Talk to your health care professional or pharmacist for more information. Do not breast-feed an infant while taking this medicine or for 2 months after stopping it. This medicine may interfere with the ability to have a child. You should talk with your doctor or health care professional if you are concerned about your fertility. What side effects may I notice from receiving this medicine? Side effects that you should report to your doctor or health care professional as soon as possible: -allergic reactions like skin rash, itching or hives, swelling of the face, lips, or tongue -breathing problems -changes in hearing -changes in vision -fast, irregular heartbeat -feeling faint or lightheaded, falls -pain, tingling, numbness in the hands or feet -right upper belly pain -seizures -swelling of the ankles, feet, hands -unusual bleeding or bruising -unusually weak or tired -vomiting -yellowing of the eyes or skin Side effects that usually do not require medical attention (report to your  doctor or health care professional if they continue or are bothersome): -changes in emotions or moods -constipation -diarrhea -loss of appetite -headache -irritation at site where injected -nausea This list may not describe all possible side effects. Call your doctor for medical advice about side effects. You may report side effects to FDA at 1-800-FDA-1088. Where should I keep my medicine? This drug is given in a hospital or clinic and will not be stored at home. NOTE: This sheet is a summary. It may not cover all possible information. If you have questions about this medicine, talk to your doctor, pharmacist, or health care provider.  2018 Elsevier/Gold Standard (2016-03-10 15:53:51)   

## 2016-09-02 ENCOUNTER — Telehealth: Payer: Self-pay | Admitting: Hematology

## 2016-09-02 ENCOUNTER — Encounter: Payer: Self-pay | Admitting: Hematology

## 2016-09-02 ENCOUNTER — Other Ambulatory Visit: Payer: Medicare HMO

## 2016-09-02 ENCOUNTER — Ambulatory Visit: Payer: Medicare HMO

## 2016-09-02 ENCOUNTER — Other Ambulatory Visit: Payer: Self-pay | Admitting: *Deleted

## 2016-09-02 ENCOUNTER — Ambulatory Visit (HOSPITAL_BASED_OUTPATIENT_CLINIC_OR_DEPARTMENT_OTHER): Payer: Medicare HMO | Admitting: Hematology

## 2016-09-02 VITALS — BP 119/67 | HR 73 | Temp 97.7°F | Resp 20 | Ht 71.0 in | Wt 201.9 lb

## 2016-09-02 DIAGNOSIS — E854 Organ-limited amyloidosis: Secondary | ICD-10-CM | POA: Diagnosis not present

## 2016-09-02 DIAGNOSIS — N049 Nephrotic syndrome with unspecified morphologic changes: Secondary | ICD-10-CM

## 2016-09-02 DIAGNOSIS — E8581 Light chain (AL) amyloidosis: Secondary | ICD-10-CM | POA: Diagnosis not present

## 2016-09-02 DIAGNOSIS — N08 Glomerular disorders in diseases classified elsewhere: Secondary | ICD-10-CM

## 2016-09-02 NOTE — Telephone Encounter (Signed)
Scheduled appt per 5/11 los, RTC with [facility]. Patient to get new schedule next treatment day .

## 2016-09-02 NOTE — Patient Instructions (Signed)
Thank you for choosing [facility] to provide your oncology and hematology care.  To afford each patient quality time with our providers, please arrive 30 minutes before your scheduled appointment time.  If you arrive late for your appointment, you may be asked to reschedule.  We strive to give you quality time with our providers, and arriving late affects you and other patients whose appointments are after yours.   If you are a no show for multiple scheduled visits, you may be dismissed from the clinic at the providers discretion.    Again, thank you for choosing [facility], our hope is that these requests will decrease the amount of time that you wait before being seen by our physicians.  ______________________________________________________________________  Should you have questions after your visit to the [facility], please contact our office at [phone] between the hours of 8:30 and 4:30 p.m.    Voicemails left after 4:30p.m will not be returned until the following business day.    For prescription refill requests, please have your pharmacy contact us directly.  Please also try to allow 48 hours for prescription requests.    Please contact the scheduling department for questions regarding scheduling.  For scheduling of procedures such as PET scans, CT scans, MRI, Ultrasound, etc please contact central scheduling at [phone].    Resources For Cancer Patients and Caregivers:   Oncolink.org:  A wonderful resource for patients and healthcare providers for information regarding your disease, ways to tract your treatment, what to expect, etc.     American Cancer Society:  800-227-2345  Can help patients locate various types of support and financial assistance  Cancer Care: 1-800-813-HOPE (4673) Provides financial assistance, online support groups, medication/co-pay assistance.    [place] DSS:  336-641-3447 Where to apply for food  stamps, Medicaid, and utility assistance  Medicare Rights Center: 800-333-4114 Helps people with Medicare understand their rights and benefits, navigate the Medicare system, and secure the quality healthcare they deserve  SCAT: 336-333-6589 [place] [employer]'s shared-ride transportation service for eligible riders who have a disability that prevents them from riding the fixed route bus.    For additional information on assistance programs please contact our social worker:   Grier Hock/Abigail Elmore:  [phone]            

## 2016-09-03 NOTE — Progress Notes (Signed)
[name] Kitchen    HEMATOLOGY/ONCOLOGY CLINIC NOTE  Date of Service: .09/02/2016  Patient Care Team: [name], MD as PCP - General (Internal Medicine) Nephrology - Dr Madelon Lips MD Dr Henrene Pastor MD - GI  CHIEF COMPLAINTS/PURPOSE OF CONSULTATION:  AL Amyloidosis -newly diagnosed  HISTORY OF PRESENTING ILLNESS:   Alex [name]. is a wonderful 75 y.o. male who has been referred to [place] by Dr .[name], MD /Dr Madelon Lips MD for evaluation and management of newly diagnosed Kidney AL Amyloidosis with nephrotic syndrome.  Patient has a history of hypertension, dyslipidemia, coronary artery disease, seizure disorder on Dilantin who was apparently in his usual state of health until 3 months ago when he started developing new onset lower extremity edema. Patient had a UA at the time that showed 4+ protein in 24-hour collection revealed 15 g of protein. Additional workup showed a creatinine of 0.8 with an albumin of 2.3 and negative SPEP and UPEP. Apparently had an elevated K/L SFLC ratio of 5.19.  He was urgently referred by his primary care physician to nephrology for additional evaluation. Patient notes no bleeding issues. No nosebleeds. No periorbital bleeding. No abnormal skin rashes. Has had significant NSAID exposure and use to take a fair amount of naproxen for chronic low back pain but has cut down on this.  Due to lack of clear etiology for his nephrotic syndrome the patient underwent a kidney biopsy on 08/06/2015 accession [id number] which showed AL amyloidosis, lambda immunophenotype, [place] red positive. Noted to have severe arteriosclerosis with moderate tubular interstitial scarring.  Patient was referred to [place] for further evaluation and treatment of his AL Amyloidosis, Patient notes that his leg swelling has improved with diuretic therapy.  He notes no weight loss. No night sweats. No focal bone pains. No skin rashes. Lungs normal bleeding or bruising.  Patient notes that  he had a lot of reading online and he and his wife had an extensive list of questions which were answered in detail.  He notes he has had some chronic issues with upper abdominal pain and nausea and that he follows with Dr. Henrene Pastor and is apparently being scheduled for an EGD and colonoscopy according to his report.  Denies having and lacks tongue. No chest pain and no overt new shortness of breath. Patient is uncertain if an echocardiogram has been ordered or scheduled.  INTERVAL HISTORY  Alex [name] is here for a scheduled follow-up for a toxicity check. He has not received his Cytoxan yet but is tolerating the Velcade and Dexamethasone without any issues. No neuropathic symptoms. Leg swelling is settling down. No nausea or other uncontrolled symptoms. Bone marrow biopsy results were discussed in details. Has about 9% plasma cells and signs of AL Amyloidosis consistent with systemic AL Amyloidosis. He is now off Dilantin and has been switched to Keppra and notes no issues. He has been recommended not to drive for 6 months.  MEDICAL HISTORY:  Past Medical History:  Diagnosis Date  . Amyloidosis ([place])   . Arthritis   . CAD (coronary artery disease)    Lexiscan Myoview 6/14: Normal study, no scar or ischemia, EF 62%  . Colon polyp   . Coronary artery disease    moderate disease by cath 2011  . Diverticulosis of colon   . GERD (gastroesophageal reflux disease)   . Hearing loss   . Hiatal hernia   . Hyperlipidemia   . Hypertension   . MRSA (methicillin resistant staph aureus) culture positive   .  Seizure disorder ([place])   . Seizures ([place])    last in 1970s  Obstructive sleep apnea Gastroesophageal reflux disease Aortic stenosis Erectile dysfunction Peripheral neuropathy Seizure disorder   SURGICAL HISTORY: Past Surgical History:  Procedure Laterality Date  . BUNIONECTOMY    . c-spine fusion    . CARPAL TUNNEL RELEASE  1994   left  . compression fracture    . error    . FOOT  SURGERY     right -twice, left foot once  . HERNIA REPAIR     Umbilical with PVP  . KNEE ARTHROSCOPY     left knee  . [place]   left - scope  . NASAL SEPTUM SURGERY    . NOSE SURGERY     twice  . ROTATOR CUFF REPAIR     twice, both shoulders  . ROTATOR CUFF REPAIR    . SPINE SURGERY     C2, C3, C4  . TRIGGER FINGER RELEASE     both hands, twice  . TRIGGER FINGER RELEASE    . VASECTOMY      SOCIAL HISTORY: Social History   Social History  . Marital status: Married    Spouse name: N/A  . Number of children: N/A  . Years of education: N/A   Occupational History  . Not on file.   Social History Main Topics  . Smoking status: Former Smoker    Quit date: 04/25/1969  . Smokeless tobacco: Former Systems developer  . Alcohol use 4.2 - 8.4 oz/week    7 - 14 Glasses of wine per week     Comment: socially shots of wiskey  . Drug use: No  . Sexual activity: Not on file   Other Topics Concern  . Not on file   Social History Narrative   ** Merged History Encounter **      Patient is currently retired.   FAMILY HISTORY: Family History  Problem Relation Age of Onset  . Diabetes Father   . Heart disease Father   . Heart attack Father 47  . Hypertension Father   . Hyperlipidemia Unknown   . Diabetes Unknown   . Hypertension Unknown   . Seizures Unknown   . Thyroid disease Unknown   . Colon cancer Neg Hx   . Stroke Neg Hx     ALLERGIES:  is allergic to ramipril and ramipril.  MEDICATIONS:  Current Outpatient Prescriptions  Medication Sig Dispense Refill  . acyclovir (ZOVIRAX) 400 MG tablet Take 1 tablet (400 mg total) by mouth 2 (two) times daily. 60 tablet 3  . aspirin 81 MG tablet Take 81 mg by mouth daily.      . cholecalciferol (VITAMIN D) 1000 units tablet Take 1,000 Units by mouth daily.    . cyclophosphamide (CYTOXAN) 50 MG tablet Take 17 tablets (850 mg total) by mouth. Take on days 1,8,15 of chemo 1hr before or 2hr after meals every 21 days. AL  Amyloidosis ICD:E85.81 51 tablet 3  . dexamethasone (DECADRON) 4 MG tablet Take 10 tablets (40 mg) on days 1, 8, and 15 of chemo. Repeat every 21 days. 30 tablet 3  . furosemide (LASIX) 20 MG tablet Take 20 mg by mouth 2 (two) times daily.    . isosorbide mononitrate (IMDUR) 30 MG 24 hr tablet Take 0.5 tablets (15 mg total) by mouth daily. 45 tablet 3  . levETIRAcetam (KEPPRA) 500 MG tablet Take 500 mg by mouth 2 (two) times daily.    [name] Kitchen losartan (  COZAAR) 100 MG tablet Take 1 tablet by mouth Daily.    . nitroGLYCERIN (NITROSTAT) 0.4 MG SL tablet Place 1 tablet (0.4 mg total) under the tongue every 5 (five) minutes as needed for chest pain. 25 tablet 6  . omeprazole (PRILOSEC) 20 MG capsule Take 20 mg by mouth daily.      . ondansetron (ZOFRAN) 8 MG tablet Take 1 tablet (8 mg total) by mouth 2 (two) times daily as needed for refractory nausea / vomiting. Start on D 4 and 11. Do not take on D8. 30 tablet 1  . potassium chloride SA (K-DUR,KLOR-CON) 20 MEQ tablet Take 20 mEq by mouth once.    . prochlorperazine (COMPAZINE) 10 MG tablet Take 1 tablet (10 mg total) by mouth every 6 (six) hours as needed (Nausea or vomiting). 30 tablet 1  . rosuvastatin (CRESTOR) 40 MG tablet Take 40 mg by mouth daily.    [name] Kitchen triamcinolone cream (KENALOG) 0.1 % Apply 1 application topically 2 (two) times daily as needed (FOR SKIN IRRITATION).      No current facility-administered medications for this visit.     REVIEW OF SYSTEMS:    10 Point review of Systems was done is negative except as noted above.  PHYSICAL EXAMINATION: ECOG PERFORMANCE STATUS: 1 - Symptomatic but completely ambulatory  . Vitals:   09/02/16 1151  BP: 119/67  Pulse: 73  Resp: 20  Temp: 97.7 F (36.5 C)   Filed Weights   09/02/16 1151  Weight: 201 lb 14.4 oz (91.6 kg)   .Body mass index is 28.16 kg/m.  GENERAL:alert, in no acute distress and comfortable SKIN: no acute rashes, no significant lesions EYES: conjunctiva are pink and  non-injected, sclera anicteric OROPHARYNX: MMM, no exudates, no oropharyngeal erythema or ulceration, no macroglossia NECK: supple, mild JVD LYMPH:  no palpable lymphadenopathy in the cervical, axillary or inguinal regions LUNGS: clear to auscultation b/l with normal respiratory effort HEART: regular rate & rhythm ABDOMEN:  normoactive bowel sounds , non tender, not distended. Extremity: 1+ pitting pedal edema bilaterally PSYCH: alert & oriented x 3 with fluent speech NEURO: no focal motor/sensory deficits  LABORATORY DATA:  I have reviewed the data as listed  . CBC Latest Ref Rng & Units 09/01/2016 08/29/2016 08/19/2016  WBC 4.0 - 10.3 10e3/uL 9.7 9.2 9.5  Hemoglobin 13.0 - 17.1 g/dL 16.1 15.8 16.9  Hematocrit 38.4 - 49.9 % 47.0 45.3 48.2  Platelets 140 - 400 10e3/uL 254 256 273    CMP Latest Ref Rng & Units 09/01/2016 08/19/2016 08/19/2016  Glucose 70 - 140 mg/dl 116 103 -  BUN 7.0 - 26.0 mg/dL 16.6 13.0 -  Creatinine 0.7 - 1.3 mg/dL 1.0 0.9 -  Sodium 136 - 145 mEq/L 140 143 -  Potassium 3.5 - 5.1 mEq/L 4.7 4.1 -  Chloride 96 - 112 mEq/L - - -  CO2 22 - 29 mEq/L 29 29 -  Calcium 8.4 - 10.4 mg/dL 8.4 8.8 -  Total Protein 6.4 - 8.3 g/dL 5.4(L) 5.9(L) 5.4(L)  Total Bilirubin 0.20 - 1.20 mg/dL 0.32 0.33 -  Alkaline Phos 40 - 150 U/L 99 114 -  AST 5 - 34 U/L 34 34 -  ALT 0 - 55 U/L 21 18 -   Component     Latest Ref Rng & Units 08/19/2016  Protime     10.6 - 13.4 Seconds 12.0  INR     2.00 - 3.50 1.00 (L)  Lovenox      No  LDH  125 - 245 U/L 207  Beta 2     0.6 - 2.4 mg/L 3.0 (H)  APTT     24 - 33 sec 29  Hepatitis B Surface Ag     Negative Negative  Hep B Core Ab, Tot     Negative Negative  Hep C Virus Ab     0.0 - 0.9 s/co ratio <0.1   Component     Latest Ref Rng & Units 08/19/2016  Ig Kappa Free Light Chain     3.3 - 19.4 mg/L 78.5 (H)  Ig Lambda Free Light Chain     5.7 - 26.3 mg/L 15.3  Kappa/Lambda FluidC Ratio     0.26 - 1.65 5.13 (H)        RADIOGRAPHIC STUDIES: I have personally reviewed the radiological images as listed and agreed with the findings in the report. US Guided Needle Placement  Result Date: 08/05/2016 INDICATION: Proteinuria of uncertain etiology. Please perform random renal biopsy for tissue diagnostic purposes. EXAM: ULTRASOUND GUIDED RENAL BIOPSY COMPARISON:  Abdominal ultrasound - 06/26/2015 ; CT abdomen pelvis - 09/13/2001 MEDICATIONS: None. ANESTHESIA/SEDATION: Fentanyl 50 mcg IV; Versed 1 mg IV Total Moderate Sedation time: 20 minutes; The patient was continuously monitored during the procedure by the interventional radiology nurse under my direct supervision. COMPLICATIONS: None immediate. PROCEDURE: Informed written consent was obtained from the patient after a discussion of the risks, benefits and alternatives to treatment. The patient understands and consents the procedure. A timeout was performed prior to the initiation of the procedure. Ultrasound scanning was performed of the bilateral flanks. The inferior pole of the right kidney was selected for biopsy due to location and sonographic window. The procedure was planned. The operative site was prepped and draped in the usual sterile fashion. The overlying soft tissues were anesthetized with 1% lidocaine with epinephrine. A 17 gauge core needle biopsy device was advanced into the inferior cortex of the right kidney and 2 core biopsies were obtained under direct ultrasound guidance. Real time pathologic review confirmed adequate tissue acquisition. Images were saved for documentation purposes. The biopsy device was removed and hemostasis was obtained with manual compression. Post procedural scanning was negative for significant post procedural hemorrhage or additional complication. A dressing was placed. The patient tolerated the procedure well without immediate post procedural complication. IMPRESSION: Technically successful ultrasound guided right renal  biopsy. Electronically Signed   By: Sandi Mariscal M.D.   On: 08/05/2016 11:13   US Biopsy  Result Date: 08/05/2016 INDICATION: Proteinuria of uncertain etiology. Please perform random renal biopsy for tissue diagnostic purposes. EXAM: ULTRASOUND GUIDED RENAL BIOPSY COMPARISON:  Abdominal ultrasound - 06/26/2015 ; CT abdomen pelvis - 09/13/2001 MEDICATIONS: None. ANESTHESIA/SEDATION: Fentanyl 50 mcg IV; Versed 1 mg IV Total Moderate Sedation time: 20 minutes; The patient was continuously monitored during the procedure by the interventional radiology nurse under my direct supervision. COMPLICATIONS: None immediate. PROCEDURE: Informed written consent was obtained from the patient after a discussion of the risks, benefits and alternatives to treatment. The patient understands and consents the procedure. A timeout was performed prior to the initiation of the procedure. Ultrasound scanning was performed of the bilateral flanks. The inferior pole of the right kidney was selected for biopsy due to location and sonographic window. The procedure was planned. The operative site was prepped and draped in the usual sterile fashion. The overlying soft tissues were anesthetized with 1% lidocaine with epinephrine. A 17 gauge core needle biopsy device was advanced into the inferior cortex of  the right kidney and 2 core biopsies were obtained under direct ultrasound guidance. Real time pathologic review confirmed adequate tissue acquisition. Images were saved for documentation purposes. The biopsy device was removed and hemostasis was obtained with manual compression. Post procedural scanning was negative for significant post procedural hemorrhage or additional complication. A dressing was placed. The patient tolerated the procedure well without immediate post procedural complication. IMPRESSION: Technically successful ultrasound guided right renal biopsy. Electronically Signed   By: Sandi Mariscal M.D.   On: 08/05/2016 11:13   Ct  Biopsy  Result Date: 08/29/2016 CLINICAL DATA:  AL amyloidosis EXAM: CT GUIDED DEEP ILIAC BONE ASPIRATION AND CORE BIOPSY TECHNIQUE: Patient was placed supine on the CT gantry and limited axial scans through the pelvis were obtained. Appropriate skin entry site was identified. Skin site was marked, prepped with chlorhexidine, draped in usual sterile fashion, and infiltrated locally with 1% lidocaine. Intravenous Fentanyl and Versed were administered as conscious sedation during continuous monitoring of the patient's level of consciousness and physiological / cardiorespiratory status by the radiology RN, with a total moderate sedation time of 10 minutes. Under CT fluoroscopic guidance an 11-gauge Cook trocar bone needle was advanced into the right iliac bone just lateral to the sacroiliac joint. Once needle tip position was confirmed, core and aspiration samples were obtained, submitted to pathology for approval. Post procedure scans show no hematoma or fracture. Patient tolerated procedure well. COMPLICATIONS: COMPLICATIONS none IMPRESSION: 1. Technically successful CT guided right iliac bone core and aspiration biopsy. Electronically Signed   By: Lucrezia Europe M.D.   On: 08/29/2016 09:49   Dg Bone Survey Met  Result Date: 08/24/2016 CLINICAL DATA:  AL amyloidosis. Staging myeloma. Hx arthritis, CAD, htn. Former Investment banker, operational. Hx spine sugery-C2-C4, bilateral rotator cuff repair, nasal septum surgery, umbilical hernia repair with PVP, compression fx EXAM: METASTATIC BONE SURVEY COMPARISON:  Cervical spine radiographs, 05/29/2014. Chest radiograph, 01/18/2011. FINDINGS: There is a lucent cortical lesion in the left fibular diaphysis. There are no other lucent/ lytic bone lesions in no osteoblastic bone lesions. Stable compression fracture of L1 unchanged from the chest radiograph, 01/18/2011. There are stable changes from a bony fusion at C5 through C7. Changes from bilateral rotator cuff surgery are also  unchanged. Bones are diffusely demineralized. IMPRESSION: 1. There is a single lucent lesion within the cortex of the left fibular diaphysis. This may reflect a benign lesion such as fibrous dysplasia. This is favored given that it is solitary and given the location. Multiple myeloma is possible. 2. There are no other lucent/lytic bone lesions. There are no osteoblastic bone lesions. No other findings to suggest neoplastic disease of bone. Electronically Signed   By: Lajean Manes M.D.   On: 08/24/2016 14:10   Ct Bone Marrow Biopsy & Aspiration  Result Date: 08/29/2016 CLINICAL DATA:  AL amyloidosis EXAM: CT GUIDED DEEP ILIAC BONE ASPIRATION AND CORE BIOPSY TECHNIQUE: Patient was placed supine on the CT gantry and limited axial scans through the pelvis were obtained. Appropriate skin entry site was identified. Skin site was marked, prepped with chlorhexidine, draped in usual sterile fashion, and infiltrated locally with 1% lidocaine. Intravenous Fentanyl and Versed were administered as conscious sedation during continuous monitoring of the patient's level of consciousness and physiological / cardiorespiratory status by the radiology RN, with a total moderate sedation time of 10 minutes. Under CT fluoroscopic guidance an 11-gauge Cook trocar bone needle was advanced into the right iliac bone just lateral to the sacroiliac joint. Once needle tip position  was confirmed, core and aspiration samples were obtained, submitted to pathology for approval. Post procedure scans show no hematoma or fracture. Patient tolerated procedure well. COMPLICATIONS: COMPLICATIONS none IMPRESSION: 1. Technically successful CT guided right iliac bone core and aspiration biopsy. Electronically Signed   By: Lucrezia Europe M.D.   On: 08/29/2016 09:49   Result status: Edited Result - FINAL                              *[facility] [employer].                         [place], [place] [address]                            [phone]  ------------------------------------------------------------------- Transthoracic Echocardiography  (Report amended )  Patient:    Nilay, Mangrum Alex #:       [id number] Study Date: 08/24/2016 Gender:     M Age:        13 Height:     180.3 cm Weight:     90.6 kg BSA:        2.15 m^2 Pt. Status: Room:   ATTENDING    Darlina Guys, MD  Willia Craze, Christopher  REFERRING    [place], [place], Outpatient  SONOGRAPHER  Roseanna Rainbow  cc:  ------------------------------------------------------------------- LV EF: 65% -   70%  ------------------------------------------------------------------- Indications:      Aortic stenosis 424.1.  ------------------------------------------------------------------- History:   PMH:  Amyloidosis.  Coronary artery disease.  Angina pectoris.  Risk factors:  Hypertension. Dyslipidemia.  ------------------------------------------------------------------- Study Conclusions  - Left ventricle: The cavity size was normal. Wall thickness was   increased in a pattern of mild LVH. Systolic function was   vigorous. The estimated ejection fraction was in the range of 65%   to 70%. Wall motion was normal; there were no regional wall   motion abnormalities. Doppler parameters are consistent with   abnormal left ventricular relaxation (grade 1 diastolic   dysfunction). - Aortic valve: Valve mobility was mildly restricted. - Aortic root: The aortic root was mildly dilated. - Mitral valve: Calcified annulus.  Impressions:  - Vigorous LV systolic function; mild diastolic dysfunction; mild   LVH; calcified aortic valve with no significant AS (peak velocity   2 m/s; mean gradient 9 mmHg); mildly dilated aortic root.    ASSESSMENT & PLAN:   76 year old male with above-mentioned multiple medical comorbidities with  #1 Newly diagnosed  biopsy proven renal and Systemic AL Amyloidosis BM Bx shows 9% plasma cells and amyloid deposits. Echo shows normal systolic function with only grade 1 diastolic dysfunction. Skeletal survey with no lytic lesions suggestive of multiple myeloma. Serum K/L ratio increased at about 5.13. #2 Newly diagnosed Nephrotic Syndrome - likely related to AL amyloidosis. As per nephrology records 24 urine showed 15 g of protein per day. Plan -Bm Bx results discussed in details with the patient and his wife. -awaiting availability of Cytoxan -- start as soon as available with next dose of Dexamethasone -continue Velcade and  weekly dexaethasone in the interim. -no prohibitive toxicities at this time. -Urine TP/creatinine ratio after cycle 1 then 24h urine after 2 cycles of treatment -SFLC after each cycle of treatment.  #3 abdominal discomfort- chronic along with nausea. -Further evaluation per primary care physician and Dr. Perry/gastroenterology. -Would like to rule out peptic ulcer disease/gastric amyloidosis/other etiology since the patient is going to be on high-dose steroids which can make ulcers worse.  #4. Seizure disorder in her the patient has been on Dilantin from one 10 years. Patient knows that he has not had a seizure for very long time. -patient has been transitioned from dilantin to keppra -he has been advised not to drive for 6 months.  #5 Patient Active Problem List   Diagnosis Date Noted  . AL amyloidosis ([place]) 08/21/2016  . Umbilical hernia 62/06/5595  . DIZZINESS 02/19/2010  . CAD, NATIVE VESSEL 07/21/2009  . CHEST PAIN-UNSPECIFIED 07/21/2009  . HYPERLIPIDEMIA 04/30/2007  . HYPERTENSION 04/30/2007  . GASTROESOPHAGEAL REFLUX DISEASE 04/30/2007  . SEIZURE DISORDER 04/30/2007  . COMPRESSION FRACTURE, L1 VERTEBRA 04/30/2007  . DIVERTICULOSIS, COLON, HX OF 04/30/2007  . Other postprocedural status(V45.89) 04/30/2007  -Continue follow-up with primary care physician for management  of other medical co-morbidities.  continue treatment as per schedule -RTC with Dr [name] C2D1 with labs   All of the patients and his wife's questions were answered to their apparent satisfaction. They are agreeable with the plan as noted above .The patient knows to call the clinic with any problems, questions or concerns.  I spent 20 minutes counseling the patient face to face. The total time spent in the appointment was 25 minutes and more than 50% was on counseling and direct patient cares.    Sullivan Lone MD [place] AAHIVMS [facility] Hematology/Oncology Physician [facility]  (Office):       [phone] (Work cell):  [phone] (Fax):           [phone]

## 2016-09-05 ENCOUNTER — Ambulatory Visit (HOSPITAL_BASED_OUTPATIENT_CLINIC_OR_DEPARTMENT_OTHER): Payer: Medicare HMO

## 2016-09-05 ENCOUNTER — Other Ambulatory Visit (HOSPITAL_BASED_OUTPATIENT_CLINIC_OR_DEPARTMENT_OTHER): Payer: Medicare HMO

## 2016-09-05 VITALS — BP 129/78 | HR 64 | Temp 97.7°F | Resp 20

## 2016-09-05 DIAGNOSIS — N08 Glomerular disorders in diseases classified elsewhere: Secondary | ICD-10-CM | POA: Diagnosis not present

## 2016-09-05 DIAGNOSIS — R569 Unspecified convulsions: Secondary | ICD-10-CM | POA: Diagnosis not present

## 2016-09-05 DIAGNOSIS — E8581 Light chain (AL) amyloidosis: Secondary | ICD-10-CM

## 2016-09-05 DIAGNOSIS — E854 Organ-limited amyloidosis: Secondary | ICD-10-CM | POA: Diagnosis not present

## 2016-09-05 DIAGNOSIS — Z5112 Encounter for antineoplastic immunotherapy: Secondary | ICD-10-CM | POA: Diagnosis not present

## 2016-09-05 LAB — CBC WITH DIFFERENTIAL/PLATELET
BASO%: 0.5 % (ref 0.0–2.0)
BASOS ABS: 0 10*3/uL (ref 0.0–0.1)
EOS ABS: 0.6 10*3/uL — AB (ref 0.0–0.5)
EOS%: 7 % (ref 0.0–7.0)
HEMATOCRIT: 45.8 % (ref 38.4–49.9)
HGB: 15.9 g/dL (ref 13.0–17.1)
LYMPH%: 39.3 % (ref 14.0–49.0)
MCH: 31.7 pg (ref 27.2–33.4)
MCHC: 34.7 g/dL (ref 32.0–36.0)
MCV: 91.2 fL (ref 79.3–98.0)
MONO#: 0.9 10*3/uL (ref 0.1–0.9)
MONO%: 10.6 % (ref 0.0–14.0)
NEUT#: 3.7 10*3/uL (ref 1.5–6.5)
NEUT%: 42.6 % (ref 39.0–75.0)
PLATELETS: 246 10*3/uL (ref 140–400)
RBC: 5.02 10*6/uL (ref 4.20–5.82)
RDW: 14.5 % (ref 11.0–14.6)
WBC: 8.6 10*3/uL (ref 4.0–10.3)
lymph#: 3.4 10*3/uL — ABNORMAL HIGH (ref 0.9–3.3)

## 2016-09-05 MED ORDER — BORTEZOMIB CHEMO SQ INJECTION 3.5 MG (2.5MG/ML)
1.3000 mg/m2 | Freq: Once | INTRAMUSCULAR | Status: AC
  Administered 2016-09-05: 2.75 mg via SUBCUTANEOUS
  Filled 2016-09-05: qty 2.75

## 2016-09-05 MED ORDER — PROCHLORPERAZINE MALEATE 10 MG PO TABS
10.0000 mg | ORAL_TABLET | Freq: Once | ORAL | Status: AC
  Administered 2016-09-05: 10 mg via ORAL

## 2016-09-05 MED ORDER — PROCHLORPERAZINE MALEATE 10 MG PO TABS: 10.0000 mg | ORAL_TABLET | Freq: Four times a day (QID) | ORAL | Status: DC | PRN

## 2016-09-05 MED ORDER — PROCHLORPERAZINE MALEATE 10 MG PO TABS
ORAL_TABLET | ORAL | Status: AC
  Filled 2016-09-05: qty 1

## 2016-09-05 NOTE — Patient Instructions (Signed)
  [place] Discharge Instructions for Patients Receiving Chemotherapy  Today you received the following chemotherapy agents bortezomib (Velcade).  To help prevent nausea and vomiting after your treatment, we encourage you to take your nausea medicationas prescribed.  If you develop nausea and vomiting that is not controlled by your nausea medication, call the clinic.   BELOW ARE SYMPTOMS THAT SHOULD BE REPORTED IMMEDIATELY:  *FEVER GREATER THAN 100.5 F  *CHILLS WITH OR WITHOUT FEVER  NAUSEA AND VOMITING THAT IS NOT CONTROLLED WITH YOUR NAUSEA MEDICATION  *UNUSUAL SHORTNESS OF BREATH  *UNUSUAL BRUISING OR BLEEDING  TENDERNESS IN MOUTH AND THROAT WITH OR WITHOUT PRESENCE OF ULCERS  *URINARY PROBLEMS  *BOWEL PROBLEMS  UNUSUAL RASH Items with * indicate a potential emergency and should be followed up as soon as possible.  Feel free to call the clinic you have any questions or concerns. The clinic phone number is [phone].  Please show the [place] at check-in to the Emergency Department and triage nurse.

## 2016-09-05 NOTE — Progress Notes (Signed)
Per Dr. [name] okay to use CMET results from 09/01/16 to administer velcade today.

## 2016-09-06 LAB — PROTEIN / CREATININE RATIO, URINE
Creatinine, Urine: 89 mg/dL
PROTEIN UR: 778.9 mg/dL
Protein/Creat Ratio: 8752 mg/g creat — ABNORMAL HIGH (ref 0–200)

## 2016-09-06 LAB — KAPPA/LAMBDA LIGHT CHAINS
IG KAPPA FREE LIGHT CHAIN: 98.3 mg/L — AB (ref 3.3–19.4)
Ig Lambda Free Light Chain: 15.8 mg/L (ref 5.7–26.3)
Kappa/Lambda FluidC Ratio: 6.22 — ABNORMAL HIGH (ref 0.26–1.65)

## 2016-09-07 ENCOUNTER — Other Ambulatory Visit: Payer: Self-pay | Admitting: *Deleted

## 2016-09-07 LAB — CHROMOSOME ANALYSIS, BONE MARROW

## 2016-09-07 LAB — TISSUE HYBRIDIZATION (BONE MARROW)-NCBH

## 2016-09-07 MED ORDER — CYCLOPHOSPHAMIDE 50 MG PO TABS: ORAL_TABLET | ORAL | 3 refills | Status: DC

## 2016-09-07 NOTE — Telephone Encounter (Signed)
Oral Chemotherapy Pharmacist Encounter  Received notification from [facility] that prior authorization for patient's oral cytoxan would be DENIED under Part D benefits but is APPROVED under patient's Part B benefits. Ref# [id number] Effective dates: 08/30/16-04/24/17  I called [facility] to follow-up on status of prescription. I was informed they could not fill cyclophosphamide at [facility] and we needed to send prescription to [facility]. Representative stated they sent a communication to the office about this matter on 09/01/16, they could not provide me with the fax number used for this communication as we have not received this fax at the office.  Cyclophosphamide prescription then e-scribed to [facility].  Oral Oncology Clinic will continue to follow.  Johny Drilling, PharmD, BCPS, BCOP 09/07/2016  9:08 AM Oral Oncology Clinic [phone]

## 2016-09-08 ENCOUNTER — Other Ambulatory Visit: Payer: Medicare HMO

## 2016-09-08 ENCOUNTER — Ambulatory Visit (HOSPITAL_BASED_OUTPATIENT_CLINIC_OR_DEPARTMENT_OTHER): Payer: Medicare HMO

## 2016-09-08 VITALS — BP 103/69 | HR 73 | Temp 97.9°F | Resp 18

## 2016-09-08 DIAGNOSIS — E8581 Light chain (AL) amyloidosis: Secondary | ICD-10-CM

## 2016-09-08 DIAGNOSIS — Z5112 Encounter for antineoplastic immunotherapy: Secondary | ICD-10-CM

## 2016-09-08 DIAGNOSIS — N08 Glomerular disorders in diseases classified elsewhere: Secondary | ICD-10-CM

## 2016-09-08 DIAGNOSIS — E854 Organ-limited amyloidosis: Secondary | ICD-10-CM | POA: Diagnosis not present

## 2016-09-08 MED ORDER — PROCHLORPERAZINE MALEATE 10 MG PO TABS
10.0000 mg | ORAL_TABLET | Freq: Once | ORAL | Status: AC
  Administered 2016-09-08: 10 mg via ORAL

## 2016-09-08 MED ORDER — PROCHLORPERAZINE MALEATE 10 MG PO TABS
ORAL_TABLET | ORAL | Status: AC
  Filled 2016-09-08: qty 1

## 2016-09-08 MED ORDER — BORTEZOMIB CHEMO SQ INJECTION 3.5 MG (2.5MG/ML)
1.3000 mg/m2 | Freq: Once | INTRAMUSCULAR | Status: AC
  Administered 2016-09-08: 2.75 mg via SUBCUTANEOUS
  Filled 2016-09-08: qty 2.75

## 2016-09-08 NOTE — Patient Instructions (Signed)
[facility] Discharge Instructions for Patients Receiving Chemotherapy  Today you received the following chemotherapy agents velcade   To help prevent nausea and vomiting after your treatment, we encourage you to take your nausea medication as directed  If you develop nausea and vomiting that is not controlled by your nausea medication, call the clinic.   BELOW ARE SYMPTOMS THAT SHOULD BE REPORTED IMMEDIATELY:  *FEVER GREATER THAN 100.5 F  *CHILLS WITH OR WITHOUT FEVER  NAUSEA AND VOMITING THAT IS NOT CONTROLLED WITH YOUR NAUSEA MEDICATION  *UNUSUAL SHORTNESS OF BREATH  *UNUSUAL BRUISING OR BLEEDING  TENDERNESS IN MOUTH AND THROAT WITH OR WITHOUT PRESENCE OF ULCERS  *URINARY PROBLEMS  *BOWEL PROBLEMS  UNUSUAL RASH Items with * indicate a potential emergency and should be followed up as soon as possible.  Feel free to call the clinic you have any questions or concerns. The clinic phone number is [phone].  

## 2016-09-13 DIAGNOSIS — R69 Illness, unspecified: Secondary | ICD-10-CM | POA: Diagnosis not present

## 2016-09-13 NOTE — Telephone Encounter (Signed)
Oral Chemotherapy Pharmacist Encounter  Received notification from [facility] that Cytoxan and generic cyclophosphamide tablets are not in stock and they need office permission to substitute capsules (50mg ) for the tablets (50mg ).  I called [employer] at [phone] to give verbal order to process prescription with capsules. All other directions on prescription remain the same.  I also stressed that prior authorization is approved under patient's Part B benefits so that we do not have further delay in getting oral cyclophosphamide filled for patient. Pharmacist, Sharee Pimple, stated she would make a note on patient's account to bill under Part B.  Oral Oncology Clinic will continue to follow.  Alex [name], PharmD, BCPS, BCOP 09/13/2016  9:34 AM Oral Oncology Clinic [phone]

## 2016-09-14 ENCOUNTER — Telehealth: Payer: Self-pay | Admitting: Hematology

## 2016-09-14 NOTE — Telephone Encounter (Signed)
Scheduled appt per sch message from Myrtie Neither - added MD visit prior to treatment on 6/1 - patient to get new schedule next visit.

## 2016-09-16 ENCOUNTER — Telehealth: Payer: Self-pay

## 2016-09-16 ENCOUNTER — Ambulatory Visit: Payer: Medicare HMO

## 2016-09-16 ENCOUNTER — Other Ambulatory Visit: Payer: Medicare HMO

## 2016-09-16 NOTE — Telephone Encounter (Signed)
Pt called to let Dr [name] his cytoxan has not yet arrived. He is expecting it today or tomorrow.  It is 1636 pm. At first did not see Dr Grier Mitts instruction and told pt to wait until Tuesday to start. Then saw in Dr Grier Mitts note to start as soon as available with next dose of dexamethasone. Called pt back and LVM with this instruction.

## 2016-09-17 ENCOUNTER — Other Ambulatory Visit: Payer: Self-pay | Admitting: Internal Medicine

## 2016-09-18 DIAGNOSIS — R69 Illness, unspecified: Secondary | ICD-10-CM | POA: Diagnosis not present

## 2016-09-20 ENCOUNTER — Ambulatory Visit (HOSPITAL_BASED_OUTPATIENT_CLINIC_OR_DEPARTMENT_OTHER): Payer: Medicare HMO

## 2016-09-20 ENCOUNTER — Ambulatory Visit: Payer: Medicare HMO

## 2016-09-20 ENCOUNTER — Other Ambulatory Visit (HOSPITAL_BASED_OUTPATIENT_CLINIC_OR_DEPARTMENT_OTHER): Payer: Medicare HMO

## 2016-09-20 ENCOUNTER — Other Ambulatory Visit: Payer: Self-pay | Admitting: Hematology

## 2016-09-20 ENCOUNTER — Other Ambulatory Visit: Payer: Self-pay | Admitting: *Deleted

## 2016-09-20 ENCOUNTER — Ambulatory Visit: Payer: Medicare HMO | Admitting: Hematology

## 2016-09-20 VITALS — BP 120/75 | HR 60 | Temp 97.6°F | Resp 19

## 2016-09-20 DIAGNOSIS — G4733 Obstructive sleep apnea (adult) (pediatric): Secondary | ICD-10-CM | POA: Diagnosis not present

## 2016-09-20 DIAGNOSIS — E854 Organ-limited amyloidosis: Secondary | ICD-10-CM | POA: Diagnosis not present

## 2016-09-20 DIAGNOSIS — N049 Nephrotic syndrome with unspecified morphologic changes: Secondary | ICD-10-CM | POA: Diagnosis not present

## 2016-09-20 DIAGNOSIS — E8581 Light chain (AL) amyloidosis: Secondary | ICD-10-CM

## 2016-09-20 DIAGNOSIS — E8589 Other amyloidosis: Secondary | ICD-10-CM | POA: Diagnosis not present

## 2016-09-20 DIAGNOSIS — N08 Glomerular disorders in diseases classified elsewhere: Secondary | ICD-10-CM

## 2016-09-20 DIAGNOSIS — Z5112 Encounter for antineoplastic immunotherapy: Secondary | ICD-10-CM

## 2016-09-20 DIAGNOSIS — G40909 Epilepsy, unspecified, not intractable, without status epilepticus: Secondary | ICD-10-CM | POA: Diagnosis not present

## 2016-09-20 LAB — CBC & DIFF AND RETIC
BASO%: 0.4 % (ref 0.0–2.0)
BASOS ABS: 0 10*3/uL (ref 0.0–0.1)
EOS%: 6.5 % (ref 0.0–7.0)
Eosinophils Absolute: 0.6 10*3/uL — ABNORMAL HIGH (ref 0.0–0.5)
HEMATOCRIT: 43 % (ref 38.4–49.9)
HGB: 14.8 g/dL (ref 13.0–17.1)
Immature Retic Fract: 7.2 % (ref 3.00–10.60)
LYMPH#: 2.7 10*3/uL (ref 0.9–3.3)
LYMPH%: 28.1 % (ref 14.0–49.0)
MCH: 31.4 pg (ref 27.2–33.4)
MCHC: 34.4 g/dL (ref 32.0–36.0)
MCV: 91.3 fL (ref 79.3–98.0)
MONO#: 0.9 10*3/uL (ref 0.1–0.9)
MONO%: 9.3 % (ref 0.0–14.0)
NEUT#: 5.2 10*3/uL (ref 1.5–6.5)
NEUT%: 55.7 % (ref 39.0–75.0)
PLATELETS: 277 10*3/uL (ref 140–400)
RBC: 4.71 10*6/uL (ref 4.20–5.82)
RDW: 14.7 % — ABNORMAL HIGH (ref 11.0–14.6)
RETIC CT ABS: 94.67 10*3/uL — AB (ref 34.80–93.90)
Retic %: 2.01 % — ABNORMAL HIGH (ref 0.80–1.80)
WBC: 9.4 10*3/uL (ref 4.0–10.3)

## 2016-09-20 LAB — COMPREHENSIVE METABOLIC PANEL
ALK PHOS: 99 U/L (ref 40–150)
ALT: 18 U/L (ref 0–55)
ANION GAP: 10 meq/L (ref 3–11)
AST: 30 U/L (ref 5–34)
Albumin: 2.4 g/dL — ABNORMAL LOW (ref 3.5–5.0)
BILIRUBIN TOTAL: 0.5 mg/dL (ref 0.20–1.20)
BUN: 24.9 mg/dL (ref 7.0–26.0)
CALCIUM: 8.7 mg/dL (ref 8.4–10.4)
CO2: 25 mEq/L (ref 22–29)
CREATININE: 1.3 mg/dL (ref 0.7–1.3)
Chloride: 107 mEq/L (ref 98–109)
EGFR: 53 mL/min/{1.73_m2} — ABNORMAL LOW (ref >90)
Glucose: 111 mg/dl (ref 70–140)
Potassium: 4.1 mEq/L (ref 3.5–5.1)
Sodium: 142 mEq/L (ref 136–145)
Total Protein: 5.3 g/dL — ABNORMAL LOW (ref 6.4–8.3)

## 2016-09-20 MED ORDER — DEXAMETHASONE 4 MG PO TABS
ORAL_TABLET | ORAL | Status: AC
  Filled 2016-09-20: qty 10

## 2016-09-20 MED ORDER — BORTEZOMIB CHEMO SQ INJECTION 3.5 MG (2.5MG/ML)
1.3000 mg/m2 | Freq: Once | INTRAMUSCULAR | Status: AC
  Administered 2016-09-20: 2.75 mg via SUBCUTANEOUS
  Filled 2016-09-20: qty 2.75

## 2016-09-20 MED ORDER — DEXAMETHASONE 4 MG PO TABS
40.0000 mg | ORAL_TABLET | Freq: Once | ORAL | Status: AC
  Administered 2016-09-20: 40 mg via ORAL

## 2016-09-20 NOTE — Progress Notes (Signed)
Nutrition Assessment   Reason for Assessment:   Weight loss  ASSESSMENT:  76 year old male with new diagnosis of kidney AL amyloidosis with nephrotic syndrome. Past medical history of HTN, dyslipidemia, CAD, seizure.  Patient receiving velcade and cytoxan.   Patient reports in November had respiratory infection with nasal drainage into throat. Reports that he has continued to have drainage and chronic nausea.  Has been seen by GI and planning EGD soon.  Reports that for the past few months especially has no taste (metallic taste at times) and no desire to eat.  Reports if wife did not remind him to eat he would not.  Reports that he has been drinking glucerna shake (180 calories, 15 gm of protein in 10 oz container).  Reports that he tries to eat meat and vegetables and fruit.    Nutrition Focused Physical Exam: deferred  Medications: Vit D, decadron, lasix, prilosec, KCL, compazine, zofran  Labs: reviewed  Anthropometrics:   Height: 71 inches Weight: 201 lb 14.4 oz BMI: 28  Reports weight loss of 12-15 lb in the last 2 months 5-6% weight loss in the last 2 months   Estimated Energy Needs  Kcals: 2000-2200 calories/d Protein: 117-156 g/d (IBW of 78 kg) Fluid: 2.2 L/d  NUTRITION DIAGNOSIS: Inadequate oral intake related to cancer and cancer related treatment as evidenced by taste change, poor po intake, weight loss of 5-6% in the last 2 months   MALNUTRITION DIAGNOSIS: continue to assess   INTERVENTION:   Discussed strategies to increase calories and protein. Fact sheet given.   Encouraged patient to eat small frequent meals and by the clock vs hunger cues. Discussed ways to handle taste changes.  Fact sheet given Discussed oral nutrition supplements and encouraged supplements with 350 calories or more to prevent continuing decline in weight.  Samples given and coupons. If unable to maintain weight and no appetite continues maybe candidate for appetite stimulant        MONITORING, EVALUATION, GOAL: Patient will consume adequate calories and protein to meet maintain weight during treatment   NEXT VISIT: phone follow-up on Tuesday, June 5th  Lylla Eifler B. Zenia Resides, [place], [place] Registered Dietitian [phone] (pager)

## 2016-09-20 NOTE — Patient Instructions (Signed)
[facility] Discharge Instructions for Patients Receiving Chemotherapy  Today you received the following chemotherapy agents: velcade  To help prevent nausea and vomiting after your treatment, we encourage you to take your nausea medication: Velcade  REMEMBER TO START YOUR ORAL CYTOXAN AT HOME AS PRESCRIBED BY YOUR PHYSICIAN. TAKE YOUR DECADRON (DEXAMETHASONE) 40 MG BY MOUTH ON DAYS 1, 8, AND 15 OF YOUR TREATMENT CYCLES. TAKE WEEKLY.    If you develop nausea and vomiting that is not controlled by your nausea medication, call the clinic.   BELOW ARE SYMPTOMS THAT SHOULD BE REPORTED IMMEDIATELY:  *FEVER GREATER THAN 100.5 F  *CHILLS WITH OR WITHOUT FEVER  NAUSEA AND VOMITING THAT IS NOT CONTROLLED WITH YOUR NAUSEA MEDICATION  *UNUSUAL SHORTNESS OF BREATH  *UNUSUAL BRUISING OR BLEEDING  TENDERNESS IN MOUTH AND THROAT WITH OR WITHOUT PRESENCE OF ULCERS  *URINARY PROBLEMS  *BOWEL PROBLEMS  UNUSUAL RASH Items with * indicate a potential emergency and should be followed up as soon as possible.  Feel free to call the clinic you have any questions or concerns. The clinic phone number is [phone].  Please show the [place] at check-in to the Emergency Department and triage nurse.

## 2016-09-20 NOTE — Progress Notes (Signed)
Patient initially had not received his cytoxan; patient brought his cytoxan in with him; per Dr. [name], he does not need to receive IV cytoxan today. Patient took his zofran at home, not decadron. Pt to receive his 40 mg decadron here in the clinic.

## 2016-09-21 ENCOUNTER — Telehealth: Payer: Self-pay | Admitting: Hematology

## 2016-09-21 NOTE — Telephone Encounter (Signed)
Scheduled appts per sch message from Paulita Fujita 5/30 - patient aware of appts and will pick up new schedule next visit.

## 2016-09-22 ENCOUNTER — Encounter: Payer: Self-pay | Admitting: Neurology

## 2016-09-22 ENCOUNTER — Telehealth: Payer: Self-pay | Admitting: Neurology

## 2016-09-22 ENCOUNTER — Ambulatory Visit (INDEPENDENT_AMBULATORY_CARE_PROVIDER_SITE_OTHER): Payer: Medicare HMO | Admitting: Neurology

## 2016-09-22 VITALS — BP 120/71 | HR 73 | Ht 71.0 in | Wt 209.8 lb

## 2016-09-22 DIAGNOSIS — R569 Unspecified convulsions: Secondary | ICD-10-CM | POA: Diagnosis not present

## 2016-09-22 NOTE — Patient Instructions (Addendum)
Remember to drink plenty of fluid, eat healthy meals and do not skip any meals. Try to eat protein with a every meal and eat a healthy snack such as fruit or nuts in between meals. Try to keep a regular sleep-wake schedule and try to exercise daily, particularly in the form of walking, 20-30 minutes a day, if you can.   As far as your medications are concerned, I would like to suggest: Continue Keppra  As far as diagnostic testing: EEG, MRI brain then an extended ambulatory eeg  I would like to see you back in 3 months, sooner if we need to. Please call us with any interim questions, concerns, problems, updates or refill requests.   Our phone number is [phone]. We also have an after hours call service for urgent matters and there is a physician on-call for urgent questions. For any emergencies you know to call 911 or go to the nearest emergency room

## 2016-09-22 NOTE — Telephone Encounter (Signed)
Alex [name], can we discuss with beau and get this patient in for eeg sooner than in 6 weeks? thanks

## 2016-09-22 NOTE — Progress Notes (Signed)
[facility]    Provider:  Dr Jaynee Eagles Referring Provider: [name], MD Primary Care Physician:  [name], MD  CC:  seizure  HPI:  Alex [name]. is a 76 y.o. male here as a referral from Dr. [name] for seizure. Past medical history hypertension, renal disease, hyperlipidemia, coronary artery disease, chronic back pain, obstructive sleep apnea, erectile dysfunction, hearing problem. He has a seizure disorder and he is was on time allotted and now on Keppra. He is on chemotherapy. He has nephrotic syndrome with a biopsy showing amyloidosis. He is a former smoker. He had first seizure in 07-Aug-1974 after his father died doing yardwork, he had a GTCS. No inciting event, no head trauma.  He had confusion afterwards. He doesn;t remember the events just remembers bing on the floor. Also tired afterwards. Last seizure Aug 06, 1976. Since then no episodes of LOC,alteration of awareness. He had 2 seizures in total. This was in his early 40s. The seizures were 2 years apart, both in the summertime. At one point was on 2 seizure medications he can;t remember the second one. Wife is here and provides information. No other focal neurologic deficits, associated symptoms, inciting events or modifiable factors. Wife is here and provides much information as well.   Reviewed notes, labs and imaging from outside physicians, which showed:  Reviewed labs included CMP with BUN 15 and creatinine 0.96, otherwise normal. CBC unremarkable.  Review of Systems: Patient complains of symptoms per HPI as well as the following symptoms murmur, hearing loss, ringing in ears, blood in stool, joint pain, joint swelling, cramps, seizure . Pertinent negatives and positives per HPI. All others negative.   Social History   Social History  . Marital status: Married    Spouse name: N/A  . Number of children: 2  . Years of education: 12   Occupational History  . Retired    Social History Main Topics  .  Smoking status: Former Smoker    Quit date: 04/25/1969  . Smokeless tobacco: Former Systems developer  . Alcohol use 4.2 - 8.4 oz/week    7 - 14 Glasses of wine per week     Comment: socially shots of wiskey  . Drug use: No  . Sexual activity: Not on file   Other Topics Concern  . Not on file   Social History Narrative   ** Merged History Encounter **   Lives at home w/ his wife   Right-handed   Caffeine: occasional Pepsi        Family History  Problem Relation Age of Onset  . Diabetes Father   . Heart disease Father   . Heart attack Father 13  . Hypertension Father   . Hyperlipidemia Unknown   . Diabetes Unknown   . Hypertension Unknown   . Seizures Unknown   . Thyroid disease Unknown   . Colon cancer Neg Hx   . Stroke Neg Hx     Past Medical History:  Diagnosis Date  . Amyloidosis ([place])   . Arthritis   . CAD (coronary artery disease)    Lexiscan Myoview 6/14: Normal study, no scar or ischemia, EF 62%  . Colon polyp   . Coronary artery disease    moderate disease by cath Aug 06, 2009  . Diverticulosis of colon   . GERD (gastroesophageal reflux disease)   . Hearing loss   . Hiatal hernia   . Hyperlipidemia   . Hypertension   . MRSA (methicillin resistant staph aureus) culture positive   .  Seizure disorder ([place])   . Seizures ([place])    last in 1970s    Past Surgical History:  Procedure Laterality Date  . BUNIONECTOMY    . CARPAL TUNNEL RELEASE  1994   left  . CERVICAL FUSION    . compression fracture    . FOOT SURGERY     right -twice, left foot once  . HERNIA REPAIR     Umbilical with PVP  . KNEE ARTHROSCOPY     left knee  . [place]   left - scope  . NASAL SEPTUM SURGERY    . NOSE SURGERY     twice  . ROTATOR CUFF REPAIR     twice, both shoulders  . ROTATOR CUFF REPAIR    . SPINE SURGERY     C2, C3, C4  . TRIGGER FINGER RELEASE     both hands, twice  . TRIGGER FINGER RELEASE    . VASECTOMY      Current Outpatient Prescriptions  Medication Sig  Dispense Refill  . acyclovir (ZOVIRAX) 400 MG tablet Take 1 tablet (400 mg total) by mouth 2 (two) times daily. 60 tablet 3  . aspirin 81 MG tablet Take 81 mg by mouth daily.      . cholecalciferol (VITAMIN D) 1000 units tablet Take 1,000 Units by mouth daily.    . cyclophosphamide (CYTOXAN) 50 MG tablet Take 17 tablets (850 mg total) by mouth. Take on days 1,8,15 of chemo 1hr before or 2hr after meals every 21 days. AL Amyloidosis ICD:E85.81 51 tablet 3  . dexamethasone (DECADRON) 4 MG tablet Take 10 tablets (40 mg) on days 1, 8, and 15 of chemo. Repeat every 21 days. 30 tablet 3  . furosemide (LASIX) 20 MG tablet Take 20 mg by mouth 2 (two) times daily.    . isosorbide mononitrate (IMDUR) 30 MG 24 hr tablet Take 0.5 tablets (15 mg total) by mouth daily. 45 tablet 3  . levETIRAcetam (KEPPRA) 500 MG tablet Take 500 mg by mouth 2 (two) times daily.    [name] Kitchen losartan (COZAAR) 100 MG tablet Take 1 tablet by mouth Daily.    . nitroGLYCERIN (NITROSTAT) 0.4 MG SL tablet Place 1 tablet (0.4 mg total) under the tongue every 5 (five) minutes as needed for chest pain. 25 tablet 6  . omeprazole (PRILOSEC) 20 MG capsule Take 20 mg by mouth daily.      . ondansetron (ZOFRAN) 8 MG tablet Take 1 tablet (8 mg total) by mouth 2 (two) times daily as needed for refractory nausea / vomiting. Start on D 4 and 11. Do not take on D8. 30 tablet 1  . potassium chloride SA (K-DUR,KLOR-CON) 20 MEQ tablet Take 20 mEq by mouth once.    . prochlorperazine (COMPAZINE) 10 MG tablet Take 1 tablet (10 mg total) by mouth every 6 (six) hours as needed (Nausea or vomiting). 30 tablet 1  . rosuvastatin (CRESTOR) 40 MG tablet Take 40 mg by mouth daily.    [name] Kitchen triamcinolone cream (KENALOG) 0.1 % Apply 1 application topically 2 (two) times daily as needed (FOR SKIN IRRITATION).      No current facility-administered medications for this visit.     Allergies as of 09/22/2016 - Review Complete 09/22/2016  Allergen Reaction Noted  . Ramipril     . Ramipril Other (See Comments) 11/10/2010    Vitals: BP 120/71   Pulse 73   Ht 5\' 11"  (1.803 m)   Wt 209 lb 12.8 oz (95.2 kg)  BMI 29.26 kg/m  Last Weight:  Wt Readings from Last 1 Encounters:  09/23/16 205 lb 7 oz (93.2 kg)   Last Height:   Ht Readings from Last 1 Encounters:  09/22/16 5\' 11"  (1.803 m)     Physical exam: Exam: Gen: NAD, conversant, well nourised, well groomed                     CV: RRR, no MRG. No Carotid Bruits. No peripheral edema, warm, nontender Eyes: Conjunctivae clear without exudates or hemorrhage  Neuro: Detailed Neurologic Exam  Speech:    Speech is normal; fluent and spontaneous with normal comprehension.  Cognition:    The patient is oriented to person, place, and time;     recent and remote memory intact;     language fluent;     normal attention, concentration,     fund of knowledge Cranial Nerves:    The pupils are equal, round, and reactive to light. The fundi are normal and spontaneous venous pulsations are present. Visual fields are full to finger confrontation. Extraocular movements are intact. Trigeminal sensation is intact and the muscles of mastication are normal. The face is symmetric. The palate elevates in the midline. Hearing intact. Voice is normal. Shoulder shrug is normal. The tongue has normal motion without fasciculations.   Coordination:    No dysmetria  Gait:    Not ataxic  Motor Observation:    No asymmetry, no atrophy, and no involuntary movements noted. Tone:    Normal muscle tone.    Posture:    Posture is normal. normal erect    Strength:    Strength is V/V in the upper and lower limbs.      Sensation: intact to LT     Reflex Exam:  DTR's:    Deep tendon reflexes in the upper and lower extremities are symmetricalbilaterally.   Toes:    The toes are downgoing bilaterally.   Clonus:    Clonus is absent.     Assessment/Plan:  [age] year old gentleman who had 2 seizures in the 1970s on  Dilantin and more recently Keppra. No seizures since then, discussed we can try to slowly discontinue keppra. Cannot ensure he will not have another seizure but given his last one was 40+ years ago it is reasonable to try and take him off of the AEDs unless we find abnormal EEG or a possible seizure focus in the brain which will necessitate further discussion. Will order MRI wo contrast due to his renal amyloidosis.  Discussed Patients with epilepsy have a small risk of sudden unexpected death, a condition referred to as sudden unexpected death in epilepsy (SUDEP). SUDEP is defined specifically as the sudden, unexpected, witnessed or unwitnessed, nontraumatic and nondrowning death in patients with epilepsy with or without evidence for a seizure, and excluding documented status epilepticus, in which post mortem examination does not reveal a structural or toxicologic cause for death   Discussed seizure precautions and driving restrictions should he have an episode. Recommend no driving for 6 months after seizure medication discontinuation but per [place] law he can drive if he has not had a seizure in more than 6 months.  Orders Placed This Encounter  Procedures  . MR BRAIN WO CONTRAST  . EEG       Sarina Ill, MD  [facility] [address] [place], [place] [address]  Phone [phone] Fax [phone]

## 2016-09-23 ENCOUNTER — Telehealth: Payer: Self-pay | Admitting: Hematology

## 2016-09-23 ENCOUNTER — Other Ambulatory Visit: Payer: Self-pay | Admitting: *Deleted

## 2016-09-23 ENCOUNTER — Ambulatory Visit (HOSPITAL_BASED_OUTPATIENT_CLINIC_OR_DEPARTMENT_OTHER): Payer: Medicare HMO

## 2016-09-23 ENCOUNTER — Ambulatory Visit (HOSPITAL_BASED_OUTPATIENT_CLINIC_OR_DEPARTMENT_OTHER): Payer: Medicare HMO | Admitting: Hematology

## 2016-09-23 ENCOUNTER — Encounter: Payer: Self-pay | Admitting: Hematology

## 2016-09-23 VITALS — BP 119/66 | HR 70 | Temp 97.9°F | Resp 18 | Wt 205.4 lb

## 2016-09-23 DIAGNOSIS — E8581 Light chain (AL) amyloidosis: Secondary | ICD-10-CM

## 2016-09-23 DIAGNOSIS — E854 Organ-limited amyloidosis: Secondary | ICD-10-CM

## 2016-09-23 DIAGNOSIS — N08 Glomerular disorders in diseases classified elsewhere: Secondary | ICD-10-CM | POA: Diagnosis not present

## 2016-09-23 DIAGNOSIS — Z5112 Encounter for antineoplastic immunotherapy: Secondary | ICD-10-CM | POA: Diagnosis not present

## 2016-09-23 MED ORDER — PROCHLORPERAZINE MALEATE 10 MG PO TABS
ORAL_TABLET | ORAL | Status: AC
  Filled 2016-09-23: qty 1

## 2016-09-23 MED ORDER — BORTEZOMIB CHEMO SQ INJECTION 3.5 MG (2.5MG/ML)
1.3000 mg/m2 | Freq: Once | INTRAMUSCULAR | Status: AC
  Administered 2016-09-23: 2.75 mg via SUBCUTANEOUS
  Filled 2016-09-23: qty 2.75

## 2016-09-23 MED ORDER — PROCHLORPERAZINE MALEATE 10 MG PO TABS
10.0000 mg | ORAL_TABLET | Freq: Once | ORAL | Status: AC
  Administered 2016-09-23: 10 mg via ORAL

## 2016-09-23 NOTE — Telephone Encounter (Signed)
Appointments scheduled per 09/23/16 los. Patient was given a copy of the AVS report and appointment schedule, per 09/23/16 los.

## 2016-09-23 NOTE — Patient Instructions (Signed)
[facility] Discharge Instructions for Patients Receiving Chemotherapy  Today you received the following chemotherapy agents:  Velcade  To help prevent nausea and vomiting after your treatment, we encourage you to take your nausea medication as prescribed.   If you develop nausea and vomiting that is not controlled by your nausea medication, call the clinic.   BELOW ARE SYMPTOMS THAT SHOULD BE REPORTED IMMEDIATELY:  *FEVER GREATER THAN 100.5 F  *CHILLS WITH OR WITHOUT FEVER  NAUSEA AND VOMITING THAT IS NOT CONTROLLED WITH YOUR NAUSEA MEDICATION  *UNUSUAL SHORTNESS OF BREATH  *UNUSUAL BRUISING OR BLEEDING  TENDERNESS IN MOUTH AND THROAT WITH OR WITHOUT PRESENCE OF ULCERS  *URINARY PROBLEMS  *BOWEL PROBLEMS  UNUSUAL RASH Items with * indicate a potential emergency and should be followed up as soon as possible.  Feel free to call the clinic you have any questions or concerns. The clinic phone number is [phone].  Please show the CHEMO ALERT CARD at check-in to the Emergency Department and triage nurse.   

## 2016-09-25 ENCOUNTER — Encounter: Payer: Self-pay | Admitting: Neurology

## 2016-09-25 NOTE — Telephone Encounter (Signed)
We need to schedule an EEG not emg. Lets discuss with Beau on Monday. thanks!

## 2016-09-26 ENCOUNTER — Other Ambulatory Visit: Payer: Self-pay | Admitting: *Deleted

## 2016-09-26 ENCOUNTER — Ambulatory Visit (HOSPITAL_BASED_OUTPATIENT_CLINIC_OR_DEPARTMENT_OTHER): Payer: Medicare HMO

## 2016-09-26 ENCOUNTER — Other Ambulatory Visit (HOSPITAL_BASED_OUTPATIENT_CLINIC_OR_DEPARTMENT_OTHER): Payer: Medicare HMO

## 2016-09-26 VITALS — BP 117/75 | HR 68 | Temp 98.0°F | Resp 18

## 2016-09-26 DIAGNOSIS — E8581 Light chain (AL) amyloidosis: Secondary | ICD-10-CM

## 2016-09-26 DIAGNOSIS — Z5112 Encounter for antineoplastic immunotherapy: Secondary | ICD-10-CM | POA: Diagnosis not present

## 2016-09-26 DIAGNOSIS — N08 Glomerular disorders in diseases classified elsewhere: Secondary | ICD-10-CM

## 2016-09-26 DIAGNOSIS — E854 Organ-limited amyloidosis: Secondary | ICD-10-CM

## 2016-09-26 LAB — CBC & DIFF AND RETIC
BASO%: 0.5 % (ref 0.0–2.0)
BASOS ABS: 0 10*3/uL (ref 0.0–0.1)
EOS ABS: 0.3 10*3/uL (ref 0.0–0.5)
EOS%: 4.2 % (ref 0.0–7.0)
HCT: 45.7 % (ref 38.4–49.9)
HEMOGLOBIN: 15.6 g/dL (ref 13.0–17.1)
Immature Retic Fract: 5.4 % (ref 3.00–10.60)
LYMPH%: 27.8 % (ref 14.0–49.0)
MCH: 31 pg (ref 27.2–33.4)
MCHC: 34.1 g/dL (ref 32.0–36.0)
MCV: 90.9 fL (ref 79.3–98.0)
MONO#: 0.7 10*3/uL (ref 0.1–0.9)
MONO%: 11.5 % (ref 0.0–14.0)
NEUT#: 3.5 10*3/uL (ref 1.5–6.5)
NEUT%: 56 % (ref 39.0–75.0)
NRBC: 4 % — AB (ref 0–0)
Platelets: 182 10*3/uL (ref 140–400)
RBC: 5.03 10*6/uL (ref 4.20–5.82)
RDW: 14.6 % (ref 11.0–14.6)
RETIC %: 0.67 % — AB (ref 0.80–1.80)
Retic Ct Abs: 33.7 10*3/uL — ABNORMAL LOW (ref 34.80–93.90)
WBC: 6.2 10*3/uL (ref 4.0–10.3)
lymph#: 1.7 10*3/uL (ref 0.9–3.3)

## 2016-09-26 LAB — COMPREHENSIVE METABOLIC PANEL
ALBUMIN: 2.4 g/dL — AB (ref 3.5–5.0)
ALK PHOS: 83 U/L (ref 40–150)
ALT: 26 U/L (ref 0–55)
AST: 46 U/L — AB (ref 5–34)
Anion Gap: 11 mEq/L (ref 3–11)
BILIRUBIN TOTAL: 0.42 mg/dL (ref 0.20–1.20)
BUN: 27.8 mg/dL — AB (ref 7.0–26.0)
CO2: 23 mEq/L (ref 22–29)
Calcium: 8.7 mg/dL (ref 8.4–10.4)
Chloride: 107 mEq/L (ref 98–109)
Creatinine: 1.3 mg/dL (ref 0.7–1.3)
EGFR: 55 mL/min/{1.73_m2} — ABNORMAL LOW (ref >90)
GLUCOSE: 134 mg/dL (ref 70–140)
Potassium: 3.8 mEq/L (ref 3.5–5.1)
SODIUM: 141 meq/L (ref 136–145)
TOTAL PROTEIN: 5.6 g/dL — AB (ref 6.4–8.3)

## 2016-09-26 MED ORDER — BORTEZOMIB CHEMO SQ INJECTION 3.5 MG (2.5MG/ML)
1.3000 mg/m2 | Freq: Once | INTRAMUSCULAR | Status: AC
  Administered 2016-09-26: 2.75 mg via SUBCUTANEOUS
  Filled 2016-09-26: qty 2.75

## 2016-09-26 MED ORDER — DEXAMETHASONE 4 MG PO TABS
40.0000 mg | ORAL_TABLET | Freq: Once | ORAL | Status: AC
  Administered 2016-09-26: 40 mg via ORAL

## 2016-09-26 MED ORDER — DEXAMETHASONE 4 MG PO TABS
ORAL_TABLET | ORAL | Status: AC
  Filled 2016-09-26: qty 10

## 2016-09-26 NOTE — Patient Instructions (Signed)
[facility] Discharge Instructions for Patients Receiving Chemotherapy  Today you received the following chemotherapy agents:  Velcade  To help prevent nausea and vomiting after your treatment, we encourage you to take your nausea medication as prescribed.   If you develop nausea and vomiting that is not controlled by your nausea medication, call the clinic.   BELOW ARE SYMPTOMS THAT SHOULD BE REPORTED IMMEDIATELY:  *FEVER GREATER THAN 100.5 F  *CHILLS WITH OR WITHOUT FEVER  NAUSEA AND VOMITING THAT IS NOT CONTROLLED WITH YOUR NAUSEA MEDICATION  *UNUSUAL SHORTNESS OF BREATH  *UNUSUAL BRUISING OR BLEEDING  TENDERNESS IN MOUTH AND THROAT WITH OR WITHOUT PRESENCE OF ULCERS  *URINARY PROBLEMS  *BOWEL PROBLEMS  UNUSUAL RASH Items with * indicate a potential emergency and should be followed up as soon as possible.  Feel free to call the clinic you have any questions or concerns. The clinic phone number is [phone].  Please show the CHEMO ALERT CARD at check-in to the Emergency Department and triage nurse.   

## 2016-09-26 NOTE — Telephone Encounter (Signed)
Called and left VM mssg for pt to call back in am if he'd like to schedule sooner appt w/ Beau for EEG tomorrow at 3:30.

## 2016-09-27 ENCOUNTER — Ambulatory Visit (INDEPENDENT_AMBULATORY_CARE_PROVIDER_SITE_OTHER): Payer: Medicare HMO

## 2016-09-27 ENCOUNTER — Telehealth: Payer: Self-pay

## 2016-09-27 DIAGNOSIS — R569 Unspecified convulsions: Secondary | ICD-10-CM | POA: Diagnosis not present

## 2016-09-27 NOTE — Telephone Encounter (Signed)
Nutrition  Spoke with wife via phone and answered questions regarding nutrition supplements.  Encouraged high calorie nutrition supplements at this time to prevent further decline in weight.   Will plan next follow-up on June 25th during infusion  Garrick Midgley B. Zenia Resides, [place], [place] Registered Dietitian [phone] (pager)

## 2016-09-29 ENCOUNTER — Ambulatory Visit (HOSPITAL_BASED_OUTPATIENT_CLINIC_OR_DEPARTMENT_OTHER): Payer: Medicare HMO

## 2016-09-29 VITALS — BP 111/68 | HR 75 | Temp 97.6°F | Resp 18

## 2016-09-29 DIAGNOSIS — E854 Organ-limited amyloidosis: Secondary | ICD-10-CM | POA: Diagnosis not present

## 2016-09-29 DIAGNOSIS — N08 Glomerular disorders in diseases classified elsewhere: Secondary | ICD-10-CM | POA: Diagnosis not present

## 2016-09-29 DIAGNOSIS — Z5112 Encounter for antineoplastic immunotherapy: Secondary | ICD-10-CM | POA: Diagnosis not present

## 2016-09-29 DIAGNOSIS — E8581 Light chain (AL) amyloidosis: Secondary | ICD-10-CM | POA: Diagnosis not present

## 2016-09-29 MED ORDER — BORTEZOMIB CHEMO SQ INJECTION 3.5 MG (2.5MG/ML)
1.3000 mg/m2 | Freq: Once | INTRAMUSCULAR | Status: AC
  Administered 2016-09-29: 2.75 mg via SUBCUTANEOUS
  Filled 2016-09-29: qty 2.75

## 2016-09-29 MED ORDER — PROCHLORPERAZINE MALEATE 10 MG PO TABS
10.0000 mg | ORAL_TABLET | Freq: Once | ORAL | Status: AC
  Administered 2016-09-29: 10 mg via ORAL

## 2016-09-29 MED ORDER — PROCHLORPERAZINE MALEATE 10 MG PO TABS
ORAL_TABLET | ORAL | Status: AC
  Filled 2016-09-29: qty 1

## 2016-09-29 NOTE — Patient Instructions (Addendum)
ALTERNATE COMPAZINE AND ONDANSETRON EVERY 4-6 HOURS FOR NAUSEA.  TAKE MIRALAX ONE TO TWO TIMES A DAY AS NEEDED FOR CONSTIPATION.   Bortezomib injection What is this medicine? BORTEZOMIB (bor TEZ oh mib) is a medicine that targets proteins in cancer cells and stops the cancer cells from growing. It is used to treat multiple myeloma and mantle-cell lymphoma. This medicine may be used for other purposes; ask your health care provider or pharmacist if you have questions. COMMON BRAND NAME(S): Velcade What should I tell my health care provider before I take this medicine? They need to know if you have any of these conditions: -diabetes -heart disease -irregular heartbeat -liver disease -on hemodialysis -low blood counts, like low white blood cells, platelets, or hemoglobin -peripheral neuropathy -taking medicine for blood pressure -an unusual or allergic reaction to bortezomib, mannitol, boron, other medicines, foods, dyes, or preservatives -pregnant or trying to get pregnant -breast-feeding How should I use this medicine? This medicine is for injection into a vein or for injection under the skin. It is given by a health care professional in a hospital or clinic setting. Talk to your pediatrician regarding the use of this medicine in children. Special care may be needed. Overdosage: If you think you have taken too much of this medicine contact a poison control center or emergency room at once. NOTE: This medicine is only for you. Do not share this medicine with others. What if I miss a dose? It is important not to miss your dose. Call your doctor or health care professional if you are unable to keep an appointment. What may interact with this medicine? This medicine may interact with the following medications: -ketoconazole -rifampin -ritonavir -St. John's Wort This list may not describe all possible interactions. Give your health care provider a list of all the medicines, herbs,  non-prescription drugs, or dietary supplements you use. Also tell them if you smoke, drink alcohol, or use illegal drugs. Some items may interact with your medicine. What should I watch for while using this medicine? You may get drowsy or dizzy. Do not drive, use machinery, or do anything that needs mental alertness until you know how this medicine affects you. Do not stand or sit up quickly, especially if you are an older patient. This reduces the risk of dizzy or fainting spells. In some cases, you may be given additional medicines to help with side effects. Follow all directions for their use. Call your doctor or health care professional for advice if you get a fever, chills or sore throat, or other symptoms of a cold or flu. Do not treat yourself. This drug decreases your body's ability to fight infections. Try to avoid being around people who are sick. This medicine may increase your risk to bruise or bleed. Call your doctor or health care professional if you notice any unusual bleeding. You may need blood work done while you are taking this medicine. In some patients, this medicine may cause a serious brain infection that may cause death. If you have any problems seeing, thinking, speaking, walking, or standing, tell your doctor right away. If you cannot reach your doctor, urgently seek other source of medical care. Check with your doctor or health care professional if you get an attack of severe diarrhea, nausea and vomiting, or if you sweat a lot. The loss of too much body fluid can make it dangerous for you to take this medicine. Do not become pregnant while taking this medicine or for at least 2  months after stopping it. Women should inform their doctor if they wish to become pregnant or think they might be pregnant. Men should not father a child while taking this medicine and for at least 2 months after stopping it. There is a potential for serious side effects to an unborn child. Talk to your  health care professional or pharmacist for more information. Do not breast-feed an infant while taking this medicine or for 2 months after stopping it. This medicine may interfere with the ability to have a child. You should talk with your doctor or health care professional if you are concerned about your fertility. What side effects may I notice from receiving this medicine? Side effects that you should report to your doctor or health care professional as soon as possible: -allergic reactions like skin rash, itching or hives, swelling of the face, lips, or tongue -breathing problems -changes in hearing -changes in vision -fast, irregular heartbeat -feeling faint or lightheaded, falls -pain, tingling, numbness in the hands or feet -right upper belly pain -seizures -swelling of the ankles, feet, hands -unusual bleeding or bruising -unusually weak or tired -vomiting -yellowing of the eyes or skin Side effects that usually do not require medical attention (report to your doctor or health care professional if they continue or are bothersome): -changes in emotions or moods -constipation -diarrhea -loss of appetite -headache -irritation at site where injected -nausea This list may not describe all possible side effects. Call your doctor for medical advice about side effects. You may report side effects to FDA at 1-800-FDA-1088. Where should I keep my medicine? This drug is given in a hospital or clinic and will not be stored at home. NOTE: This sheet is a summary. It may not cover all possible information. If you have questions about this medicine, talk to your doctor, pharmacist, or health care provider.  2018 Elsevier/Gold Standard (2016-03-10 15:53:51)

## 2016-09-30 NOTE — Procedures (Signed)
   [facility]  EEG (ELECTROENCEPHALOGRAM) REPORT   STUDY DATE: 09/27/16 PATIENT NAME: Alex [name]. DOB: 12-07-1940 MRN: [id number]  ORDERING CLINICIAN: Sarina Ill, MD   TECHNOLOGIST: Oneita Jolly TECHNIQUE: Electroencephalogram was recorded utilizing standard 10-20 system of lead placement and reformatted into average and bipolar montages.  RECORDING TIME: 22 minutes ACTIVATION: hyperventilation and photic stimulation  CLINICAL INFORMATION: 76 year old male with seizures.  FINDINGS: Background rhythms of 8-9 hertz and 20-30 microvolts. No focal, lateralizing, epileptiform activity or seizures are seen. Patient recorded in the awake and drowsy state. EKG channel shows 60 beats per minute.   IMPRESSION:  Normal EEG in the awake and drowsy states.    INTERPRETING PHYSICIAN:  Penni Bombard, MD Certified in Neurology, Neurophysiology and Neuroimaging  [facility] [address] [place], [place] [address] [phone]

## 2016-10-05 ENCOUNTER — Telehealth: Payer: Self-pay | Admitting: Neurology

## 2016-10-05 MED ORDER — ALPRAZOLAM 0.5 MG PO TABS: ORAL_TABLET | ORAL | 0 refills | Status: DC

## 2016-10-05 NOTE — Telephone Encounter (Signed)
Rx signed and faxed to pharmacy. Pt notified via TC.

## 2016-10-05 NOTE — Telephone Encounter (Signed)
Rx printed, awaiting signature. 

## 2016-10-05 NOTE — Telephone Encounter (Signed)
Ok to do # 3    I'll sign it

## 2016-10-05 NOTE — Telephone Encounter (Signed)
Pt asking for a medication to take before his MRI to keep him calm, pt would like it called into  [facility], [place]. [phone] (Phone) [phone] (Fax)   Please call to advise

## 2016-10-05 NOTE — Addendum Note (Signed)
Addended by: Monte Fantasia on: 10/05/2016 02:32 PM   Modules accepted: Orders

## 2016-10-05 NOTE — Addendum Note (Signed)
Addended by: Monte Fantasia on: 10/05/2016 01:45 PM   Modules accepted: Orders

## 2016-10-06 ENCOUNTER — Telehealth: Payer: Self-pay | Admitting: Neurology

## 2016-10-06 NOTE — Telephone Encounter (Signed)
Pt calling to inform that his insurance will not cover the MRI so the appointment has been cancelled.  Pt would like a call back advising him of how to gradually come off the Kepra please call

## 2016-10-07 ENCOUNTER — Telehealth: Payer: Self-pay

## 2016-10-07 ENCOUNTER — Other Ambulatory Visit: Payer: Medicare HMO

## 2016-10-07 ENCOUNTER — Ambulatory Visit: Payer: Medicare HMO

## 2016-10-07 NOTE — Telephone Encounter (Signed)
Pt experiencing 4 days of constipation per message left by wife. Spoke with pt on the phone and he voiced that he has tried miralax with minimal results. Dr. [name] recommended for the past to try mag citrate or senna OTC. Told pt if pain and discomfort become severe to go to the ED. Otherwise, to call on Monday and let us know how he is doing.

## 2016-10-07 NOTE — Telephone Encounter (Signed)
Still fine but I would decrease it even slower. Start with 250mg  in the morning and 500mg  at night for 2 weeks. Then 250mg  twice daily for 2 weeks. Then 250mg  once daily for 2 weeks. Thanks

## 2016-10-07 NOTE — Telephone Encounter (Signed)
Pt is currently has Keppra 500 mg ordered BID. Had normal EEG on 09/27/16.

## 2016-10-10 ENCOUNTER — Ambulatory Visit (HOSPITAL_BASED_OUTPATIENT_CLINIC_OR_DEPARTMENT_OTHER): Payer: Medicare HMO

## 2016-10-10 ENCOUNTER — Other Ambulatory Visit (HOSPITAL_BASED_OUTPATIENT_CLINIC_OR_DEPARTMENT_OTHER): Payer: Medicare HMO

## 2016-10-10 ENCOUNTER — Other Ambulatory Visit: Payer: Self-pay | Admitting: *Deleted

## 2016-10-10 VITALS — BP 113/67 | HR 73 | Temp 98.1°F | Resp 18

## 2016-10-10 DIAGNOSIS — E854 Organ-limited amyloidosis: Secondary | ICD-10-CM | POA: Diagnosis not present

## 2016-10-10 DIAGNOSIS — Z5112 Encounter for antineoplastic immunotherapy: Secondary | ICD-10-CM | POA: Diagnosis not present

## 2016-10-10 DIAGNOSIS — E8581 Light chain (AL) amyloidosis: Secondary | ICD-10-CM

## 2016-10-10 DIAGNOSIS — N08 Glomerular disorders in diseases classified elsewhere: Secondary | ICD-10-CM

## 2016-10-10 LAB — CBC WITH DIFFERENTIAL/PLATELET
BASO%: 0.5 % (ref 0.0–2.0)
Basophils Absolute: 0 10*3/uL (ref 0.0–0.1)
EOS ABS: 0.1 10*3/uL (ref 0.0–0.5)
EOS%: 2.3 % (ref 0.0–7.0)
HCT: 39.6 % (ref 38.4–49.9)
HEMOGLOBIN: 13.3 g/dL (ref 13.0–17.1)
LYMPH#: 1.6 10*3/uL (ref 0.9–3.3)
LYMPH%: 26.4 % (ref 14.0–49.0)
MCH: 30.6 pg (ref 27.2–33.4)
MCHC: 33.6 g/dL (ref 32.0–36.0)
MCV: 91 fL (ref 79.3–98.0)
MONO#: 1 10*3/uL — AB (ref 0.1–0.9)
MONO%: 16.9 % — ABNORMAL HIGH (ref 0.0–14.0)
NEUT%: 53.9 % (ref 39.0–75.0)
NEUTROS ABS: 3.2 10*3/uL (ref 1.5–6.5)
PLATELETS: 402 10*3/uL — AB (ref 140–400)
RBC: 4.35 10*6/uL (ref 4.20–5.82)
RDW: 14.9 % — AB (ref 11.0–14.6)
WBC: 6 10*3/uL (ref 4.0–10.3)

## 2016-10-10 LAB — COMPREHENSIVE METABOLIC PANEL
ALBUMIN: 2.7 g/dL — AB (ref 3.5–5.0)
ALK PHOS: 95 U/L (ref 40–150)
ALT: 21 U/L (ref 0–55)
ANION GAP: 11 meq/L (ref 3–11)
AST: 28 U/L (ref 5–34)
BUN: 21.5 mg/dL (ref 7.0–26.0)
CALCIUM: 8.8 mg/dL (ref 8.4–10.4)
CO2: 24 mEq/L (ref 22–29)
CREATININE: 1.3 mg/dL (ref 0.7–1.3)
Chloride: 108 mEq/L (ref 98–109)
EGFR: 53 mL/min/{1.73_m2} — ABNORMAL LOW (ref >90)
Glucose: 101 mg/dl (ref 70–140)
POTASSIUM: 3.9 meq/L (ref 3.5–5.1)
Sodium: 142 mEq/L (ref 136–145)
Total Bilirubin: 0.39 mg/dL (ref 0.20–1.20)
Total Protein: 5.7 g/dL — ABNORMAL LOW (ref 6.4–8.3)

## 2016-10-10 MED ORDER — BORTEZOMIB CHEMO SQ INJECTION 3.5 MG (2.5MG/ML)
1.3000 mg/m2 | Freq: Once | INTRAMUSCULAR | Status: AC
  Administered 2016-10-10: 2.75 mg via SUBCUTANEOUS
  Filled 2016-10-10: qty 2.75

## 2016-10-10 MED ORDER — ONDANSETRON HCL 8 MG PO TABS
8.0000 mg | ORAL_TABLET | Freq: Once | ORAL | Status: AC
  Administered 2016-10-10: 8 mg via ORAL

## 2016-10-10 MED ORDER — LEVETIRACETAM 250 MG PO TABS: ORAL_TABLET | ORAL | 0 refills | Status: DC

## 2016-10-10 MED ORDER — ONDANSETRON HCL 8 MG PO TABS
ORAL_TABLET | ORAL | Status: AC
  Filled 2016-10-10: qty 1

## 2016-10-10 NOTE — Telephone Encounter (Signed)
New rx e-scribed to pt's pharmacy. Called pt and reviewed med instructions. He verbalized understanding and appreciation for call.

## 2016-10-10 NOTE — Addendum Note (Signed)
Addended by: Monte Fantasia on: 10/10/2016 05:07 PM   Modules accepted: Orders

## 2016-10-10 NOTE — Patient Instructions (Signed)
[facility] Discharge Instructions for Patients Receiving Chemotherapy  Today you received the following chemotherapy agents: Velcade   To help prevent nausea and vomiting after your treatment, we encourage you to take your nausea medication as directed   If you develop nausea and vomiting that is not controlled by your nausea medication, call the clinic.   BELOW ARE SYMPTOMS THAT SHOULD BE REPORTED IMMEDIATELY:  *FEVER GREATER THAN 100.5 F  *CHILLS WITH OR WITHOUT FEVER  NAUSEA AND VOMITING THAT IS NOT CONTROLLED WITH YOUR NAUSEA MEDICATION  *UNUSUAL SHORTNESS OF BREATH  *UNUSUAL BRUISING OR BLEEDING  TENDERNESS IN MOUTH AND THROAT WITH OR WITHOUT PRESENCE OF ULCERS  *URINARY PROBLEMS  *BOWEL PROBLEMS  UNUSUAL RASH Items with * indicate a potential emergency and should be followed up as soon as possible.  Feel free to call the clinic you have any questions or concerns. The clinic phone number is [phone].  Please show the CHEMO ALERT CARD at check-in to the Emergency Department and triage nurse.   Bortezomib injection What is this medicine? BORTEZOMIB (bor TEZ oh mib) is a medicine that targets proteins in cancer cells and stops the cancer cells from growing. It is used to treat multiple myeloma and mantle-cell lymphoma. This medicine may be used for other purposes; ask your health care provider or pharmacist if you have questions. COMMON BRAND NAME(S): Velcade What should I tell my health care provider before I take this medicine? They need to know if you have any of these conditions: -diabetes -heart disease -irregular heartbeat -liver disease -on hemodialysis -low blood counts, like low white blood cells, platelets, or hemoglobin -peripheral neuropathy -taking medicine for blood pressure -an unusual or allergic reaction to bortezomib, mannitol, boron, other medicines, foods, dyes, or preservatives -pregnant or trying to get  pregnant -breast-feeding How should I use this medicine? This medicine is for injection into a vein or for injection under the skin. It is given by a health care professional in a hospital or clinic setting. Talk to your pediatrician regarding the use of this medicine in children. Special care may be needed. Overdosage: If you think you have taken too much of this medicine contact a poison control center or emergency room at once. NOTE: This medicine is only for you. Do not share this medicine with others. What if I miss a dose? It is important not to miss your dose. Call your doctor or health care professional if you are unable to keep an appointment. What may interact with this medicine? This medicine may interact with the following medications: -ketoconazole -rifampin -ritonavir -St. John's Wort This list may not describe all possible interactions. Give your health care provider a list of all the medicines, herbs, non-prescription drugs, or dietary supplements you use. Also tell them if you smoke, drink alcohol, or use illegal drugs. Some items may interact with your medicine. What should I watch for while using this medicine? You may get drowsy or dizzy. Do not drive, use machinery, or do anything that needs mental alertness until you know how this medicine affects you. Do not stand or sit up quickly, especially if you are an older patient. This reduces the risk of dizzy or fainting spells. In some cases, you may be given additional medicines to help with side effects. Follow all directions for their use. Call your doctor or health care professional for advice if you get a fever, chills or sore throat, or other symptoms of a cold or flu. Do not   not treat yourself. This drug decreases your body's ability to fight infections. Try to avoid being around people who are sick. This medicine may increase your risk to bruise or bleed. Call your doctor or health care professional if you notice any unusual  bleeding. You may need blood work done while you are taking this medicine. In some patients, this medicine may cause a serious brain infection that may cause death. If you have any problems seeing, thinking, speaking, walking, or standing, tell your doctor right away. If you cannot reach your doctor, urgently seek other source of medical care. Check with your doctor or health care professional if you get an attack of severe diarrhea, nausea and vomiting, or if you sweat a lot. The loss of too much body fluid can make it dangerous for you to take this medicine. Do not become pregnant while taking this medicine or for at least 2 months after stopping it. Women should inform their doctor if they wish to become pregnant or think they might be pregnant. Men should not father a child while taking this medicine and for at least 2 months after stopping it. There is a potential for serious side effects to an unborn child. Talk to your health care professional or pharmacist for more information. Do not breast-feed an infant while taking this medicine or for 2 months after stopping it. This medicine may interfere with the ability to have a child. You should talk with your doctor or health care professional if you are concerned about your fertility. What side effects may I notice from receiving this medicine? Side effects that you should report to your doctor or health care professional as soon as possible: -allergic reactions like skin rash, itching or hives, swelling of the face, lips, or tongue -breathing problems -changes in hearing -changes in vision -fast, irregular heartbeat -feeling faint or lightheaded, falls -pain, tingling, numbness in the hands or feet -right upper belly pain -seizures -swelling of the ankles, feet, hands -unusual bleeding or bruising -unusually weak or tired -vomiting -yellowing of the eyes or skin Side effects that usually do not require medical attention (report to your  doctor or health care professional if they continue or are bothersome): -changes in emotions or moods -constipation -diarrhea -loss of appetite -headache -irritation at site where injected -nausea This list may not describe all possible side effects. Call your doctor for medical advice about side effects. You may report side effects to FDA at 1-800-FDA-1088. Where should I keep my medicine? This drug is given in a hospital or clinic and will not be stored at home. NOTE: This sheet is a summary. It may not cover all possible information. If you have questions about this medicine, talk to your doctor, pharmacist, or health care provider.  2018 Elsevier/Gold Standard (2016-03-10 15:53:51)

## 2016-10-11 LAB — KAPPA/LAMBDA LIGHT CHAINS
IG KAPPA FREE LIGHT CHAIN: 34.4 mg/L — AB (ref 3.3–19.4)
IG LAMBDA FREE LIGHT CHAIN: 12.6 mg/L (ref 5.7–26.3)
KAPPA/LAMBDA FLC RATIO: 2.73 — AB (ref 0.26–1.65)

## 2016-10-11 NOTE — Progress Notes (Signed)
[name] Kitchen    HEMATOLOGY/ONCOLOGY CLINIC NOTE  Date of Service: .09/23/2016  Patient Care Team: [name], MD as PCP - General (Internal Medicine) Nephrology - Dr Madelon Lips MD Dr Henrene Pastor MD - GI  CHIEF COMPLAINTS/PURPOSE OF CONSULTATION:  AL Amyloidosis -newly diagnosed  HISTORY OF PRESENTING ILLNESS:   Chip Canepa. is a wonderful 76 y.o. male who has been referred to [place] by Dr .[name], MD /Dr Madelon Lips MD for evaluation and management of newly diagnosed Kidney AL Amyloidosis with nephrotic syndrome.  Patient has a history of hypertension, dyslipidemia, coronary artery disease, seizure disorder on Dilantin who was apparently in his usual state of health until 3 months ago when he started developing new onset lower extremity edema. Patient had a UA at the time that showed 4+ protein in 24-hour collection revealed 15 g of protein. Additional workup showed a creatinine of 0.8 with an albumin of 2.3 and negative SPEP and UPEP. Apparently had an elevated K/L SFLC ratio of 5.19.  He was urgently referred by his primary care physician to nephrology for additional evaluation. Patient notes no bleeding issues. No nosebleeds. No periorbital bleeding. No abnormal skin rashes. Has had significant NSAID exposure and use to take a fair amount of naproxen for chronic low back pain but has cut down on this.  Due to lack of clear etiology for his nephrotic syndrome the patient underwent a kidney biopsy on 08/06/2015 accession [id number] which showed AL amyloidosis, lambda immunophenotype, [place] red positive. Noted to have severe arteriosclerosis with moderate tubular interstitial scarring.  Patient was referred to [place] for further evaluation and treatment of his AL Amyloidosis, Patient notes that his leg swelling has improved with diuretic therapy.  He notes no weight loss. No night sweats. No focal bone pains. No skin rashes. Lungs normal bleeding or bruising.  Patient notes that  he had a lot of reading online and he and his wife had an extensive list of questions which were answered in detail.  He notes he has had some chronic issues with upper abdominal pain and nausea and that he follows with Dr. Henrene Pastor and is apparently being scheduled for an EGD and colonoscopy according to his report.  Denies having and lacks tongue. No chest pain and no overt new shortness of breath. Patient is uncertain if an echocardiogram has been ordered or scheduled.  INTERVAL HISTORY  [name] is here for a scheduled follow-up with completion of his first cycle of treatment with Velcade and dexamethasone. He recently obtained the Cytoxan and will start taking this. This required a lot of work with his insurance company. He is a random urine protein creatinine ratio from 09/05/2016 showed a protein creatinine ratio of 8.7g/g creatinine.  We shall get a detailed myeloma markers: Serum free light chains and 24-hour urine analysis after completion of 2 cycles of treatment. No other prohibitive toxicities at this time.  MEDICAL HISTORY:  Past Medical History:  Diagnosis Date  . Amyloidosis ([place])   . Arthritis   . CAD (coronary artery disease)    Lexiscan Myoview 6/14: Normal study, no scar or ischemia, EF 62%  . Colon polyp   . Coronary artery disease    moderate disease by cath 2011  . Diverticulosis of colon   . GERD (gastroesophageal reflux disease)   . Hearing loss   . Hiatal hernia   . Hyperlipidemia   . Hypertension   . MRSA (methicillin resistant staph aureus) culture positive   . Seizure disorder ([place])   .  Seizures ([place])    last in 1970s  Obstructive sleep apnea Gastroesophageal reflux disease Aortic stenosis Erectile dysfunction Peripheral neuropathy Seizure disorder   SURGICAL HISTORY: Past Surgical History:  Procedure Laterality Date  . BUNIONECTOMY    . CARPAL TUNNEL RELEASE  1994   left  . CERVICAL FUSION    . compression fracture    . FOOT SURGERY      right -twice, left foot once  . HERNIA REPAIR     Umbilical with PVP  . KNEE ARTHROSCOPY     left knee  . [place]   left - scope  . NASAL SEPTUM SURGERY    . NOSE SURGERY     twice  . ROTATOR CUFF REPAIR     twice, both shoulders  . ROTATOR CUFF REPAIR    . SPINE SURGERY     C2, C3, C4  . TRIGGER FINGER RELEASE     both hands, twice  . TRIGGER FINGER RELEASE    . VASECTOMY      SOCIAL HISTORY: Social History   Social History  . Marital status: Married    Spouse name: N/A  . Number of children: 2  . Years of education: 12   Occupational History  . Retired    Social History Main Topics  . Smoking status: Former Smoker    Quit date: 04/25/1969  . Smokeless tobacco: Former Systems developer  . Alcohol use 4.2 - 8.4 oz/week    7 - 14 Glasses of wine per week     Comment: socially shots of wiskey  . Drug use: No  . Sexual activity: Not on file   Other Topics Concern  . Not on file   Social History Narrative   ** Merged History Encounter **   Lives at home w/ his wife   Right-handed   Caffeine: occasional Pepsi      Patient is currently retired.   FAMILY HISTORY: Family History  Problem Relation Age of Onset  . Diabetes Father   . Heart disease Father   . Heart attack Father 43  . Hypertension Father   . Hyperlipidemia Unknown   . Diabetes Unknown   . Hypertension Unknown   . Seizures Unknown   . Thyroid disease Unknown   . Colon cancer Neg Hx   . Stroke Neg Hx     ALLERGIES:  is allergic to ramipril and ramipril.  MEDICATIONS:  Current Outpatient Prescriptions  Medication Sig Dispense Refill  . acyclovir (ZOVIRAX) 400 MG tablet Take 1 tablet (400 mg total) by mouth 2 (two) times daily. 60 tablet 3  . ALPRAZolam (XANAX) 0.5 MG tablet Take 1 tablet up to one hour prior to MRI for anxiety, may repeat if needed. Do not drive. 3 tablet 0  . aspirin 81 MG tablet Take 81 mg by mouth daily.      . cholecalciferol (VITAMIN D) 1000 units tablet Take 1,000  Units by mouth daily.    . cyclophosphamide (CYTOXAN) 50 MG tablet Take 17 tablets (850 mg total) by mouth. Take on days 1,8,15 of chemo 1hr before or 2hr after meals every 21 days. AL Amyloidosis ICD:E85.81 51 tablet 3  . dexamethasone (DECADRON) 4 MG tablet Take 10 tablets (40 mg) on days 1, 8, and 15 of chemo. Repeat every 21 days. 30 tablet 3  . furosemide (LASIX) 20 MG tablet Take 20 mg by mouth 2 (two) times daily.    . isosorbide mononitrate (IMDUR) 30 MG 24 hr tablet  Take 0.5 tablets (15 mg total) by mouth daily. 45 tablet 3  . levETIRAcetam (KEPPRA) 250 MG tablet Take 1 tab in am and 2 tabs in pm x 2 wks, then 1 tab 2x/day x 2 wks, then 1 tab daily x 2 wks. 104 tablet 0  . losartan (COZAAR) 100 MG tablet Take 1 tablet by mouth Daily.    . nitroGLYCERIN (NITROSTAT) 0.4 MG SL tablet Place 1 tablet (0.4 mg total) under the tongue every 5 (five) minutes as needed for chest pain. 25 tablet 6  . omeprazole (PRILOSEC) 20 MG capsule Take 20 mg by mouth daily.      . ondansetron (ZOFRAN) 8 MG tablet Take 1 tablet (8 mg total) by mouth 2 (two) times daily as needed for refractory nausea / vomiting. Start on D 4 and 11. Do not take on D8. 30 tablet 1  . potassium chloride SA (K-DUR,KLOR-CON) 20 MEQ tablet Take 20 mEq by mouth once.    . prochlorperazine (COMPAZINE) 10 MG tablet Take 1 tablet (10 mg total) by mouth every 6 (six) hours as needed (Nausea or vomiting). 30 tablet 1  . rosuvastatin (CRESTOR) 40 MG tablet Take 40 mg by mouth daily.    [name] Kitchen triamcinolone cream (KENALOG) 0.1 % Apply 1 application topically 2 (two) times daily as needed (FOR SKIN IRRITATION).      No current facility-administered medications for this visit.     REVIEW OF SYSTEMS:    10 Point review of Systems was done is negative except as noted above.  PHYSICAL EXAMINATION: ECOG PERFORMANCE STATUS: 1 - Symptomatic but completely ambulatory  . Vitals:   09/23/16 1024  BP: 119/66  Pulse: 70  Resp: 18  Temp: 97.9 F  (36.6 C)   Filed Weights   09/23/16 1024  Weight: 205 lb 7 oz (93.2 kg)   .Body mass index is 28.65 kg/m.  GENERAL:alert, in no acute distress and comfortable SKIN: no acute rashes, no significant lesions EYES: conjunctiva are pink and non-injected, sclera anicteric OROPHARYNX: MMM, no exudates, no oropharyngeal erythema or ulceration, no macroglossia NECK: supple, mild JVD LYMPH:  no palpable lymphadenopathy in the cervical, axillary or inguinal regions LUNGS: clear to auscultation b/l with normal respiratory effort HEART: regular rate & rhythm ABDOMEN:  normoactive bowel sounds , non tender, not distended. Extremity: 1+ pitting pedal edema bilaterally PSYCH: alert & oriented x 3 with fluent speech NEURO: no focal motor/sensory deficits  LABORATORY DATA:  I have reviewed the data as listed  . CBC Latest Ref Rng & Units 10/10/2016 09/26/2016 09/20/2016  WBC 4.0 - 10.3 10e3/uL 6.0 6.2 9.4  Hemoglobin 13.0 - 17.1 g/dL 13.3 15.6 14.8  Hematocrit 38.4 - 49.9 % 39.6 45.7 43.0  Platelets 140 - 400 10e3/uL 402(H) 182 277    CMP Latest Ref Rng & Units 10/10/2016 09/26/2016 09/20/2016  Glucose 70 - 140 mg/dl 101 134 111  BUN 7.0 - 26.0 mg/dL 21.5 27.8(H) 24.9  Creatinine 0.7 - 1.3 mg/dL 1.3 1.3 1.3  Sodium 136 - 145 mEq/L 142 141 142  Potassium 3.5 - 5.1 mEq/L 3.9 3.8 4.1  Chloride 96 - 112 mEq/L - - -  CO2 22 - 29 mEq/L '24 23 25  ' Calcium 8.4 - 10.4 mg/dL 8.8 8.7 8.7  Total Protein 6.4 - 8.3 g/dL 5.7(L) 5.6(L) 5.3(L)  Total Bilirubin 0.20 - 1.20 mg/dL 0.39 0.42 0.50  Alkaline Phos 40 - 150 U/L 95 83 99  AST 5 - 34 U/L 28 46(H) 30  ALT  0 - 55 U/L '21 26 18   ' Component     Latest Ref Rng & Units 08/19/2016  Protime     10.6 - 13.4 Seconds 12.0  INR     2.00 - 3.50 1.00 (L)  Lovenox      No  LDH     125 - 245 U/L 207  Beta 2     0.6 - 2.4 mg/L 3.0 (H)  APTT     24 - 33 sec 29  Hepatitis B Surface Ag     Negative Negative  Hep B Core Ab, Tot     Negative Negative  Hep C  Virus Ab     0.0 - 0.9 s/co ratio <0.1   Component     Latest Ref Rng & Units 08/19/2016  Ig Kappa Free Light Chain     3.3 - 19.4 mg/L 78.5 (H)  Ig Lambda Free Light Chain     5.7 - 26.3 mg/L 15.3  Kappa/Lambda FluidC Ratio     0.26 - 1.65 5.13 (H)       RADIOGRAPHIC STUDIES: I have personally reviewed the radiological images as listed and agreed with the findings in the report. No results found. Result status: Edited Result - FINAL                              *[facility] [employer].                        [place], [place] [address]                            [phone]  ------------------------------------------------------------------- Transthoracic Echocardiography  (Report amended )  Patient:    Ramondo, Dietze MR #:       [id number] Study Date: 08/24/2016 Gender:     M Age:        89 Height:     180.3 cm Weight:     90.6 kg BSA:        2.15 m^2 Pt. Status: Room:   ATTENDING    Darlina Guys, MD  Willia Craze, Christopher  REFERRING    [place], [place], Outpatient  SONOGRAPHER  Roseanna Rainbow  cc:  ------------------------------------------------------------------- LV EF: 65% -   70%  ------------------------------------------------------------------- Indications:      Aortic stenosis 424.1.  ------------------------------------------------------------------- History:   PMH:  Amyloidosis.  Coronary artery disease.  Angina pectoris.  Risk factors:  Hypertension. Dyslipidemia.  ------------------------------------------------------------------- Study Conclusions  - Left ventricle: The cavity size was normal. Wall thickness was   increased in a pattern of mild LVH. Systolic function was   vigorous. The estimated ejection fraction was in the range of 65%   to 70%. Wall motion was normal; there were no regional wall   motion  abnormalities. Doppler parameters are consistent with   abnormal left ventricular relaxation (grade 1 diastolic   dysfunction). - Aortic valve: Valve mobility was mildly restricted. - Aortic root: The aortic root was mildly dilated. - Mitral valve: Calcified annulus.  Impressions:  -  Vigorous LV systolic function; mild diastolic dysfunction; mild   LVH; calcified aortic valve with no significant AS (peak velocity   2 m/s; mean gradient 9 mmHg); mildly dilated aortic root.    ASSESSMENT & PLAN:   76 year old male with above-mentioned multiple medical comorbidities with  #1 Newly diagnosed biopsy proven renal and Systemic AL Amyloidosis BM Bx shows 9% plasma cells and amyloid deposits. Echo shows normal systolic function with only grade 1 diastolic dysfunction. Skeletal survey with no lytic lesions suggestive of multiple myeloma. Serum K/L ratio increased at about 5.13. #2 Newly diagnosed Nephrotic Syndrome - likely related to AL amyloidosis. As per nephrology records 24 urine showed 15 g of protein per day. Rpt random Urine protein/creatinine ratio from 5/14 showed decrease to 8.7g/g creatinine Plan -Labs stable no prohibitive toxicities -continue Velcade and weekly dexaethasone and Cytoxan.  -24h urine after 2 cycles of treatment and with each cycle -SFLC after each cycle of treatment.  #3 abdominal discomfort- chronic along with nausea. Improved. -Further evaluation per primary care physician and Dr. Perry/gastroenterology.  #4. Seizure disorder in her the patient has been on Dilantin from one 10 years. Patient knows that he has not had a seizure for very long time. -patient has been transitioned from dilantin to keppra. No breakthrough seizures -he has been advised not to drive for 6 months.  #5 Patient Active Problem List   Diagnosis Date Noted  . AL amyloidosis ([place]) 08/21/2016  . Umbilical hernia 33/29/5188  . DIZZINESS 02/19/2010  . CAD, NATIVE VESSEL 07/21/2009  .  CHEST PAIN-UNSPECIFIED 07/21/2009  . HYPERLIPIDEMIA 04/30/2007  . HYPERTENSION 04/30/2007  . GASTROESOPHAGEAL REFLUX DISEASE 04/30/2007  . SEIZURE DISORDER 04/30/2007  . COMPRESSION FRACTURE, L1 VERTEBRA 04/30/2007  . DIVERTICULOSIS, COLON, HX OF 04/30/2007  . Other postprocedural status(V45.89) 04/30/2007  -Continue follow-up with primary care physician for management of other medical co-morbidities.  -continue treatment as per schedule. Oral oral/topical-labs D1 and D8 every cycle -RTC with Dr [name] on C3D8   All of the patients and his wife's questions were answered to their apparent satisfaction. They are agreeable with the plan as noted above .The patient knows to call the clinic with any problems, questions or concerns.  I spent 20 minutes counseling the patient face to face. The total time spent in the appointment was 25 minutes and more than 50% was on counseling and direct patient cares.    Sullivan Lone MD Edgar AAHIVMS [facility] Hematology/Oncology Physician [facility]  (Office):       [phone] (Work cell):  [phone] (Fax):           [phone]

## 2016-10-12 ENCOUNTER — Other Ambulatory Visit: Payer: Self-pay | Admitting: *Deleted

## 2016-10-12 DIAGNOSIS — E8581 Light chain (AL) amyloidosis: Secondary | ICD-10-CM

## 2016-10-12 LAB — MULTIPLE MYELOMA PANEL, SERUM
Albumin SerPl Elph-Mcnc: 2.6 g/dL — ABNORMAL LOW (ref 2.9–4.4)
Albumin/Glob SerPl: 1.1 (ref 0.7–1.7)
Alpha 1: 0.2 g/dL (ref 0.0–0.4)
Alpha2 Glob SerPl Elph-Mcnc: 1.1 g/dL — ABNORMAL HIGH (ref 0.4–1.0)
B-GLOBULIN SERPL ELPH-MCNC: 0.9 g/dL (ref 0.7–1.3)
GAMMA GLOB SERPL ELPH-MCNC: 0.3 g/dL — AB (ref 0.4–1.8)
GLOBULIN, TOTAL: 2.5 g/dL (ref 2.2–3.9)
IgA, Qn, Serum: 55 mg/dL — ABNORMAL LOW (ref 61–437)
IgG, Qn, Serum: 261 mg/dL — ABNORMAL LOW (ref 700–1600)
IgM, Qn, Serum: 34 mg/dL (ref 15–143)
Total Protein: 5.1 g/dL — ABNORMAL LOW (ref 6.0–8.5)

## 2016-10-13 ENCOUNTER — Telehealth: Payer: Self-pay | Admitting: Internal Medicine

## 2016-10-13 ENCOUNTER — Ambulatory Visit (HOSPITAL_BASED_OUTPATIENT_CLINIC_OR_DEPARTMENT_OTHER): Payer: Medicare HMO

## 2016-10-13 ENCOUNTER — Other Ambulatory Visit (HOSPITAL_BASED_OUTPATIENT_CLINIC_OR_DEPARTMENT_OTHER): Payer: Medicare HMO

## 2016-10-13 VITALS — BP 113/65 | HR 78 | Temp 98.2°F | Resp 18

## 2016-10-13 DIAGNOSIS — E8581 Light chain (AL) amyloidosis: Secondary | ICD-10-CM

## 2016-10-13 DIAGNOSIS — Z5112 Encounter for antineoplastic immunotherapy: Secondary | ICD-10-CM | POA: Diagnosis not present

## 2016-10-13 DIAGNOSIS — E854 Organ-limited amyloidosis: Secondary | ICD-10-CM | POA: Diagnosis not present

## 2016-10-13 DIAGNOSIS — N08 Glomerular disorders in diseases classified elsewhere: Secondary | ICD-10-CM | POA: Diagnosis not present

## 2016-10-13 LAB — CBC WITH DIFFERENTIAL/PLATELET
BASO%: 1 % (ref 0.0–2.0)
Basophils Absolute: 0.1 10*3/uL (ref 0.0–0.1)
EOS ABS: 0.1 10*3/uL (ref 0.0–0.5)
EOS%: 2.3 % (ref 0.0–7.0)
HEMATOCRIT: 41 % (ref 38.4–49.9)
HEMOGLOBIN: 14.2 g/dL (ref 13.0–17.1)
LYMPH%: 21.4 % (ref 14.0–49.0)
MCH: 31.3 pg (ref 27.2–33.4)
MCHC: 34.6 g/dL (ref 32.0–36.0)
MCV: 90.6 fL (ref 79.3–98.0)
MONO#: 1.2 10*3/uL — AB (ref 0.1–0.9)
MONO%: 24.3 % — ABNORMAL HIGH (ref 0.0–14.0)
NEUT%: 51 % (ref 39.0–75.0)
NEUTROS ABS: 2.6 10*3/uL (ref 1.5–6.5)
PLATELETS: 317 10*3/uL (ref 140–400)
RBC: 4.53 10*6/uL (ref 4.20–5.82)
RDW: 15 % — AB (ref 11.0–14.6)
WBC: 5 10*3/uL (ref 4.0–10.3)
lymph#: 1.1 10*3/uL (ref 0.9–3.3)

## 2016-10-13 LAB — COMPREHENSIVE METABOLIC PANEL
ALBUMIN: 2.5 g/dL — AB (ref 3.5–5.0)
ALK PHOS: 102 U/L (ref 40–150)
ALT: 16 U/L (ref 0–55)
ANION GAP: 11 meq/L (ref 3–11)
AST: 24 U/L (ref 5–34)
BILIRUBIN TOTAL: 0.5 mg/dL (ref 0.20–1.20)
BUN: 30.1 mg/dL — ABNORMAL HIGH (ref 7.0–26.0)
CALCIUM: 9.2 mg/dL (ref 8.4–10.4)
CO2: 25 mEq/L (ref 22–29)
Chloride: 107 mEq/L (ref 98–109)
Creatinine: 1.5 mg/dL — ABNORMAL HIGH (ref 0.7–1.3)
EGFR: 45 mL/min/{1.73_m2} — AB (ref >90)
GLUCOSE: 101 mg/dL (ref 70–140)
Potassium: 4 mEq/L (ref 3.5–5.1)
Sodium: 143 mEq/L (ref 136–145)
Total Protein: 5.8 g/dL — ABNORMAL LOW (ref 6.4–8.3)

## 2016-10-13 MED ORDER — BORTEZOMIB CHEMO SQ INJECTION 3.5 MG (2.5MG/ML)
1.3000 mg/m2 | Freq: Once | INTRAMUSCULAR | Status: AC
  Administered 2016-10-13: 2.75 mg via SUBCUTANEOUS
  Filled 2016-10-13: qty 2.75

## 2016-10-13 MED ORDER — ONDANSETRON HCL 8 MG PO TABS
8.0000 mg | ORAL_TABLET | Freq: Once | ORAL | Status: AC
  Administered 2016-10-13: 8 mg via ORAL

## 2016-10-13 MED ORDER — ONDANSETRON HCL 8 MG PO TABS
ORAL_TABLET | ORAL | Status: AC
  Filled 2016-10-13: qty 1

## 2016-10-13 NOTE — Patient Instructions (Signed)
[facility] Discharge Instructions for Patients Receiving Chemotherapy  Today you received the following chemotherapy agents: Velcade   To help prevent nausea and vomiting after your treatment, we encourage you to take your nausea medication as directed   If you develop nausea and vomiting that is not controlled by your nausea medication, call the clinic.   BELOW ARE SYMPTOMS THAT SHOULD BE REPORTED IMMEDIATELY:  *FEVER GREATER THAN 100.5 F  *CHILLS WITH OR WITHOUT FEVER  NAUSEA AND VOMITING THAT IS NOT CONTROLLED WITH YOUR NAUSEA MEDICATION  *UNUSUAL SHORTNESS OF BREATH  *UNUSUAL BRUISING OR BLEEDING  TENDERNESS IN MOUTH AND THROAT WITH OR WITHOUT PRESENCE OF ULCERS  *URINARY PROBLEMS  *BOWEL PROBLEMS  UNUSUAL RASH Items with * indicate a potential emergency and should be followed up as soon as possible.  Feel free to call the clinic you have any questions or concerns. The clinic phone number is [phone].  Please show the CHEMO ALERT CARD at check-in to the Emergency Department and triage nurse.   Bortezomib injection What is this medicine? BORTEZOMIB (bor TEZ oh mib) is a medicine that targets proteins in cancer cells and stops the cancer cells from growing. It is used to treat multiple myeloma and mantle-cell lymphoma. This medicine may be used for other purposes; ask your health care provider or pharmacist if you have questions. COMMON BRAND NAME(S): Velcade What should I tell my health care provider before I take this medicine? They need to know if you have any of these conditions: -diabetes -heart disease -irregular heartbeat -liver disease -on hemodialysis -low blood counts, like low white blood cells, platelets, or hemoglobin -peripheral neuropathy -taking medicine for blood pressure -an unusual or allergic reaction to bortezomib, mannitol, boron, other medicines, foods, dyes, or preservatives -pregnant or trying to get  pregnant -breast-feeding How should I use this medicine? This medicine is for injection into a vein or for injection under the skin. It is given by a health care professional in a hospital or clinic setting. Talk to your pediatrician regarding the use of this medicine in children. Special care may be needed. Overdosage: If you think you have taken too much of this medicine contact a poison control center or emergency room at once. NOTE: This medicine is only for you. Do not share this medicine with others. What if I miss a dose? It is important not to miss your dose. Call your doctor or health care professional if you are unable to keep an appointment. What may interact with this medicine? This medicine may interact with the following medications: -ketoconazole -rifampin -ritonavir -St. John's Wort This list may not describe all possible interactions. Give your health care provider a list of all the medicines, herbs, non-prescription drugs, or dietary supplements you use. Also tell them if you smoke, drink alcohol, or use illegal drugs. Some items may interact with your medicine. What should I watch for while using this medicine? You may get drowsy or dizzy. Do not drive, use machinery, or do anything that needs mental alertness until you know how this medicine affects you. Do not stand or sit up quickly, especially if you are an older patient. This reduces the risk of dizzy or fainting spells. In some cases, you may be given additional medicines to help with side effects. Follow all directions for their use. Call your doctor or health care professional for advice if you get a fever, chills or sore throat, or other symptoms of a cold or flu. Do not   not treat yourself. This drug decreases your body's ability to fight infections. Try to avoid being around people who are sick. This medicine may increase your risk to bruise or bleed. Call your doctor or health care professional if you notice any unusual  bleeding. You may need blood work done while you are taking this medicine. In some patients, this medicine may cause a serious brain infection that may cause death. If you have any problems seeing, thinking, speaking, walking, or standing, tell your doctor right away. If you cannot reach your doctor, urgently seek other source of medical care. Check with your doctor or health care professional if you get an attack of severe diarrhea, nausea and vomiting, or if you sweat a lot. The loss of too much body fluid can make it dangerous for you to take this medicine. Do not become pregnant while taking this medicine or for at least 2 months after stopping it. Women should inform their doctor if they wish to become pregnant or think they might be pregnant. Men should not father a child while taking this medicine and for at least 2 months after stopping it. There is a potential for serious side effects to an unborn child. Talk to your health care professional or pharmacist for more information. Do not breast-feed an infant while taking this medicine or for 2 months after stopping it. This medicine may interfere with the ability to have a child. You should talk with your doctor or health care professional if you are concerned about your fertility. What side effects may I notice from receiving this medicine? Side effects that you should report to your doctor or health care professional as soon as possible: -allergic reactions like skin rash, itching or hives, swelling of the face, lips, or tongue -breathing problems -changes in hearing -changes in vision -fast, irregular heartbeat -feeling faint or lightheaded, falls -pain, tingling, numbness in the hands or feet -right upper belly pain -seizures -swelling of the ankles, feet, hands -unusual bleeding or bruising -unusually weak or tired -vomiting -yellowing of the eyes or skin Side effects that usually do not require medical attention (report to your  doctor or health care professional if they continue or are bothersome): -changes in emotions or moods -constipation -diarrhea -loss of appetite -headache -irritation at site where injected -nausea This list may not describe all possible side effects. Call your doctor for medical advice about side effects. You may report side effects to FDA at 1-800-FDA-1088. Where should I keep my medicine? This drug is given in a hospital or clinic and will not be stored at home. NOTE: This sheet is a summary. It may not cover all possible information. If you have questions about this medicine, talk to your doctor, pharmacist, or health care provider.  2018 Elsevier/Gold Standard (2016-03-10 15:53:51)

## 2016-10-13 NOTE — Telephone Encounter (Signed)
If no concerns from his treating physician, then okay to proceed. He should check with his oncologist

## 2016-10-13 NOTE — Telephone Encounter (Signed)
Pt being treated with chemotherapy for Amyloidosis at the cancer center. He is scheduled for ECL at the end of July. Pt is calling wanting to know if he is still ok to proceed with the procedures. Please advise.

## 2016-10-13 NOTE — Telephone Encounter (Signed)
Spoke with pt and he is aware and will check with the oncologist and get back with our office.

## 2016-10-17 ENCOUNTER — Ambulatory Visit (HOSPITAL_BASED_OUTPATIENT_CLINIC_OR_DEPARTMENT_OTHER): Payer: Medicare HMO | Admitting: Hematology

## 2016-10-17 ENCOUNTER — Encounter: Payer: Self-pay | Admitting: Hematology

## 2016-10-17 ENCOUNTER — Ambulatory Visit: Payer: Medicare HMO | Admitting: Nutrition

## 2016-10-17 ENCOUNTER — Other Ambulatory Visit (HOSPITAL_BASED_OUTPATIENT_CLINIC_OR_DEPARTMENT_OTHER): Payer: Medicare HMO

## 2016-10-17 ENCOUNTER — Telehealth: Payer: Self-pay | Admitting: Hematology

## 2016-10-17 ENCOUNTER — Ambulatory Visit (HOSPITAL_BASED_OUTPATIENT_CLINIC_OR_DEPARTMENT_OTHER): Payer: Medicare HMO

## 2016-10-17 VITALS — BP 110/74 | HR 70 | Temp 98.0°F | Resp 20

## 2016-10-17 DIAGNOSIS — E860 Dehydration: Secondary | ICD-10-CM

## 2016-10-17 DIAGNOSIS — E8581 Light chain (AL) amyloidosis: Secondary | ICD-10-CM

## 2016-10-17 DIAGNOSIS — R109 Unspecified abdominal pain: Secondary | ICD-10-CM

## 2016-10-17 DIAGNOSIS — N08 Glomerular disorders in diseases classified elsewhere: Secondary | ICD-10-CM

## 2016-10-17 DIAGNOSIS — E854 Organ-limited amyloidosis: Secondary | ICD-10-CM

## 2016-10-17 DIAGNOSIS — Z5112 Encounter for antineoplastic immunotherapy: Secondary | ICD-10-CM

## 2016-10-17 LAB — CBC WITH DIFFERENTIAL/PLATELET
BASO%: 0.4 % (ref 0.0–2.0)
Basophils Absolute: 0 10*3/uL (ref 0.0–0.1)
EOS ABS: 0.1 10*3/uL (ref 0.0–0.5)
EOS%: 2.6 % (ref 0.0–7.0)
HEMATOCRIT: 41.5 % (ref 38.4–49.9)
HEMOGLOBIN: 14.2 g/dL (ref 13.0–17.1)
LYMPH#: 1.3 10*3/uL (ref 0.9–3.3)
LYMPH%: 25.3 % (ref 14.0–49.0)
MCH: 31 pg (ref 27.2–33.4)
MCHC: 34.2 g/dL (ref 32.0–36.0)
MCV: 90.6 fL (ref 79.3–98.0)
MONO#: 0.8 10*3/uL (ref 0.1–0.9)
MONO%: 15.2 % — ABNORMAL HIGH (ref 0.0–14.0)
NEUT%: 56.5 % (ref 39.0–75.0)
NEUTROS ABS: 2.8 10*3/uL (ref 1.5–6.5)
PLATELETS: 211 10*3/uL (ref 140–400)
RBC: 4.58 10*6/uL (ref 4.20–5.82)
RDW: 14.6 % (ref 11.0–14.6)
WBC: 5 10*3/uL (ref 4.0–10.3)
nRBC: 7 % — ABNORMAL HIGH (ref 0–0)

## 2016-10-17 LAB — UPEP/UIFE/LIGHT CHAINS/TP, 24-HR UR
% BETA, URINE: 8.6 %
ALBUMIN, U: 81.7 %
ALPHA 1 URINE: 5.1 %
ALPHA-2-GLOBULIN, U: 3.6 %
FREE KAPPA LT CHAINS, UR: 53.6 mg/L — AB (ref 1.35–24.19)
Free Lambda Lt Chains,Ur: 4.73 mg/L (ref 0.24–6.66)
GAMMA GLOBULIN URINE: 1 %
Kappa/Lambda Ratio,U: 11.33 — ABNORMAL HIGH (ref 2.04–10.37)
PROTEIN,TOTAL,URINE: 273.5 mg/dL
Prot,24hr calculated: 16410 mg/24 hr — ABNORMAL HIGH (ref 30–150)

## 2016-10-17 LAB — COMPREHENSIVE METABOLIC PANEL
ALBUMIN: 2.6 g/dL — AB (ref 3.5–5.0)
ALT: 17 U/L (ref 0–55)
ANION GAP: 11 meq/L (ref 3–11)
AST: 32 U/L (ref 5–34)
Alkaline Phosphatase: 93 U/L (ref 40–150)
BUN: 35.9 mg/dL — ABNORMAL HIGH (ref 7.0–26.0)
CALCIUM: 9.3 mg/dL (ref 8.4–10.4)
CHLORIDE: 104 meq/L (ref 98–109)
CO2: 26 mEq/L (ref 22–29)
CREATININE: 1.6 mg/dL — AB (ref 0.7–1.3)
EGFR: 41 mL/min/{1.73_m2} — ABNORMAL LOW (ref >90)
Glucose: 114 mg/dl (ref 70–140)
Potassium: 3.8 mEq/L (ref 3.5–5.1)
Sodium: 141 mEq/L (ref 136–145)
Total Bilirubin: 0.45 mg/dL (ref 0.20–1.20)
Total Protein: 5.9 g/dL — ABNORMAL LOW (ref 6.4–8.3)

## 2016-10-17 MED ORDER — ONDANSETRON HCL 8 MG PO TABS
ORAL_TABLET | ORAL | Status: AC
  Filled 2016-10-17: qty 1

## 2016-10-17 MED ORDER — BORTEZOMIB CHEMO SQ INJECTION 3.5 MG (2.5MG/ML)
1.3000 mg/m2 | Freq: Once | INTRAMUSCULAR | Status: AC
  Administered 2016-10-17: 2.75 mg via SUBCUTANEOUS
  Filled 2016-10-17: qty 2.75

## 2016-10-17 MED ORDER — ONDANSETRON HCL 8 MG PO TABS
8.0000 mg | ORAL_TABLET | Freq: Once | ORAL | Status: AC
  Administered 2016-10-17: 8 mg via ORAL

## 2016-10-17 MED ORDER — SODIUM CHLORIDE 0.9 % IV SOLN: INTRAVENOUS | Status: DC

## 2016-10-17 MED ORDER — SODIUM CHLORIDE 0.9 % IV SOLN
Freq: Once | INTRAVENOUS | Status: AC
  Administered 2016-10-17: 11:00:00 via INTRAVENOUS

## 2016-10-17 NOTE — Patient Instructions (Signed)
Thank you for choosing [facility] to provide your oncology and hematology care.  To afford each patient quality time with our providers, please arrive 30 minutes before your scheduled appointment time.  If you arrive late for your appointment, you may be asked to reschedule.  We strive to give you quality time with our providers, and arriving late affects you and other patients whose appointments are after yours.  If you are a no show for multiple scheduled visits, you may be dismissed from the clinic at the providers discretion.   Again, thank you for choosing [facility], our hope is that these requests will decrease the amount of time that you wait before being seen by our physicians.  ______________________________________________________________________ Should you have questions after your visit to the [facility], please contact our office at [phone] between the hours of 8:30 and 4:30 p.m.    Voicemails left after 4:30p.m will not be returned until the following business day.   For prescription refill requests, please have your pharmacy contact us directly.  Please also try to allow 48 hours for prescription requests.   Please contact the scheduling department for questions regarding scheduling.  For scheduling of procedures such as PET scans, CT scans, MRI, Ultrasound, etc please contact central scheduling at [phone].   Resources For Cancer Patients and Caregivers:  American Cancer Society:  800-227-2345  Can help patients locate various types of support and financial assistance Cancer Care: 1-800-813-HOPE (4673) Provides financial assistance, online support groups, medication/co-pay assistance.   [place] DSS:  336-641-3447 Where to apply for food stamps, Medicaid, and utility assistance Medicare Rights Center: 800-333-4114 Helps people with Medicare understand their rights and benefits, navigate the Medicare system, and secure the  quality healthcare they deserve SCAT: 336-333-6589 [place] [employer]'s shared-ride transportation service for eligible riders who have a disability that prevents them from riding the fixed route bus.   For additional information on assistance programs please contact our social worker:   Grier Hock/Abigail Elmore:  [phone] 

## 2016-10-17 NOTE — Progress Notes (Signed)
Nutrition follow-up completed with patient during chemotherapy.   Patient reports he has nausea. Weight improved documented as 205 pounds on June 1 increased from 201 pounds May 11. Reports taste alterations continue.  Nutrition diagnosis: Inadequate oral intake improved.  Intervention: Educated patient again on importance of small frequent meals with high-calorie, high-protein foods to promote weight maintenance. Educated patient on strategies for taste alterations. Educated patient on the importance of taking nausea medications. Provided fact sheets and answered questions. Teach back method used.  Monitoring, evaluation, goals:  Patient will tolerate increased calories and protein for weight maintenance.  Next visit: Monday, July 16, during infusion.  **Disclaimer: This note was dictated with voice recognition software. Similar sounding words can inadvertently be transcribed and this note may contain transcription errors which may not have been corrected upon publication of note.**

## 2016-10-17 NOTE — Telephone Encounter (Signed)
Cycle 3, 4 and 5 scheduled per 10/17/16 los. Follow up with Dr [name], scheduled for 4 weeks per 10/17/16 los. Labs scheduled day 1 and day 8 every cycle, per 10/17/16 los. Patient was given a copy of the AVS report and appointment schedule, per 10/17/16 los.

## 2016-10-17 NOTE — Progress Notes (Signed)
Okay to treat today despite Creatinine = 1.6, per Dr. [name].

## 2016-10-17 NOTE — Progress Notes (Signed)
Okay to tx with Ctn 1.6.

## 2016-10-17 NOTE — Patient Instructions (Addendum)
[facility] Discharge Instructions for Patients Receiving Chemotherapy  Today you received the following chemotherapy agents Velcade  To help prevent nausea and vomiting after your treatment, we encourage you to take your nausea medication    If you develop nausea and vomiting that is not controlled by your nausea medication, call the clinic.   BELOW ARE SYMPTOMS THAT SHOULD BE REPORTED IMMEDIATELY:  *FEVER GREATER THAN 100.5 F  *CHILLS WITH OR WITHOUT FEVER  NAUSEA AND VOMITING THAT IS NOT CONTROLLED WITH YOUR NAUSEA MEDICATION  *UNUSUAL SHORTNESS OF BREATH  *UNUSUAL BRUISING OR BLEEDING  TENDERNESS IN MOUTH AND THROAT WITH OR WITHOUT PRESENCE OF ULCERS  *URINARY PROBLEMS  *BOWEL PROBLEMS  UNUSUAL RASH Items with * indicate a potential emergency and should be followed up as soon as possible.  Feel free to call the clinic you have any questions or concerns. The clinic phone number is [phone].  Please show the [place] at check-in to the Emergency Department and triage nurse.    Dehydration, Adult Dehydration is when there is not enough fluid or water in your body. This happens when you lose more fluids than you take in. Dehydration can range from mild to very bad. It should be treated right away to keep it from getting very bad. Symptoms of mild dehydration may include:  Thirst.  Dry lips.  Slightly dry mouth.  Dry, warm skin.  Dizziness. Symptoms of moderate dehydration may include:  Very dry mouth.  Muscle cramps.  Dark pee (urine). Pee may be the color of tea.  Your body making less pee.  Your eyes making fewer tears.  Heartbeat that is uneven or faster than normal (palpitations).  Headache.  Light-headedness, especially when you stand up from sitting.  Fainting (syncope). Symptoms of very bad dehydration may include:  Changes in skin, such as: ? Cold and clammy skin. ? Blotchy (mottled) or pale skin. ? Skin  that does not quickly return to normal after being lightly pinched and let go (poor skin turgor).  Changes in body fluids, such as: ? Feeling very thirsty. ? Your eyes making fewer tears. ? Not sweating when body temperature is high, such as in hot weather. ? Your body making very little pee.  Changes in vital signs, such as: ? Weak pulse. ? Pulse that is more than 100 beats a minute when you are sitting still. ? Fast breathing. ? Low blood pressure.  Other changes, such as: ? Sunken eyes. ? Cold hands and feet. ? Confusion. ? Lack of energy (lethargy). ? Trouble waking up from sleep. ? Short-term weight loss. ? Unconsciousness. Follow these instructions at home:  If told by your doctor, drink an ORS: ? Make an ORS by using instructions on the package. ? Start by drinking small amounts, about  cup (120 mL) every 5-10 minutes. ? Slowly drink more until you have had the amount that your doctor said to have.  Drink enough clear fluid to keep your pee clear or pale yellow. If you were told to drink an ORS, finish the ORS first, then start slowly drinking clear fluids. Drink fluids such as: ? Water. Do not drink only water by itself. Doing that can make the salt (sodium) level in your body get too low (hyponatremia). ? Ice chips. ? Fruit juice that you have added water to (diluted). ? Low-calorie sports drinks.  Avoid: ? Alcohol. ? Drinks that have a lot of sugar. These include high-calorie sports drinks, fruit juice that  does not have water added, and soda. ? Caffeine. ? Foods that are greasy or have a lot of fat or sugar.  Take over-the-counter and prescription medicines only as told by your doctor.  Do not take salt tablets. Doing that can make the salt level in your body get too high (hypernatremia).  Eat foods that have minerals (electrolytes). Examples include bananas, oranges, potatoes, tomatoes, and spinach.  Keep all follow-up visits as told by your doctor. This is  important. Contact a doctor if:  You have belly (abdominal) pain that: ? Gets worse. ? Stays in one area (localizes).  You have a rash.  You have a stiff neck.  You get angry or annoyed more easily than normal (irritability).  You are more sleepy than normal.  You have a harder time waking up than normal.  You feel: ? Weak. ? Dizzy. ? Very thirsty.  You have peed (urinated) only a small amount of very dark pee during 6-8 hours. Get help right away if:  You have symptoms of very bad dehydration.  You cannot drink fluids without throwing up (vomiting).  Your symptoms get worse with treatment.  You have a fever.  You have a very bad headache.  You are throwing up or having watery poop (diarrhea) and it: ? Gets worse. ? Does not go away.  You have blood or something green (bile) in your throw-up.  You have blood in your poop (stool). This may cause poop to look black and tarry.  You have not peed in 6-8 hours.  You pass out (faint).  Your heart rate when you are sitting still is more than 100 beats a minute.  You have trouble breathing. This information is not intended to replace advice given to you by your health care provider. Make sure you discuss any questions you have with your health care provider. Document Released: 02/05/2009 Document Revised: 10/30/2015 Document Reviewed: 06/05/2015 Elsevier Interactive Patient Education  2018 [employer].

## 2016-10-17 NOTE — Progress Notes (Signed)
[name]    HEMATOLOGY/ONCOLOGY CLINIC NOTE  Date of Service: .10/17/2016  Patient Care Team: [name], MD as PCP - General (Internal Medicine) Nephrology - Dr Madelon Lips MD Dr Henrene Pastor MD - GI  CHIEF COMPLAINTS/PURPOSE OF CONSULTATION:  AL Amyloidosis -newly diagnosed  HISTORY OF PRESENTING ILLNESS:   Alex Cudd. is a wonderful 76 y.o. male who has been referred to [place] by Dr .[name], MD /Dr Madelon Lips MD for evaluation and management of newly diagnosed Kidney AL Amyloidosis with nephrotic syndrome.  Patient has a history of hypertension, dyslipidemia, coronary artery disease, seizure disorder on Dilantin who was apparently in his usual state of health until 3 months ago when he started developing new onset lower extremity edema. Patient had a UA at the time that showed 4+ protein in 24-hour collection revealed 15 g of protein. Additional workup showed a creatinine of 0.8 with an albumin of 2.3 and negative SPEP and UPEP. Apparently had an elevated K/L SFLC ratio of 5.19.  He was urgently referred by his primary care physician to nephrology for additional evaluation. Patient notes no bleeding issues. No nosebleeds. No periorbital bleeding. No abnormal skin rashes. Has had significant NSAID exposure and use to take a fair amount of naproxen for chronic low back pain but has cut down on this.  Due to lack of clear etiology for his nephrotic syndrome the patient underwent a kidney biopsy on 08/06/2015 accession [id number] which showed AL amyloidosis, lambda immunophenotype, [place] red positive. Noted to have severe arteriosclerosis with moderate tubular interstitial scarring.  Patient was referred to [place] for further evaluation and treatment of his AL Amyloidosis, Patient notes that his leg swelling has improved with diuretic therapy.  He notes no weight loss. No night sweats. No focal bone pains. No skin rashes. Lungs normal bleeding or bruising.  Patient notes that  he had a lot of reading online and he and his wife had an extensive list of questions which were answered in detail.  He notes he has had some chronic issues with upper abdominal pain and nausea and that he follows with Dr. Henrene Pastor and is apparently being scheduled for an EGD and colonoscopy according to his report.  Denies having and lacks tongue. No chest pain and no overt new shortness of breath. Patient is uncertain if an echocardiogram has been ordered or scheduled.  INTERVAL HISTORY  Alex [name] is here for a scheduled follow-up for his AL amyloidosis. Today is cycle 3 day 8 of treatment. Last serum free light chains show good hematologic response. 24-hour UPEP from 10/10/2016 is currently pending. Note some decreased appetite and mild nausea controlled with medications.  Inadequate by mouth fluid intake. Still on Lasix and appears to be dehydrated based on labs and clinical examination. Lasix was held. He has no current pedal edema. Recommended daily weight monitoring and monitoring for increasing leg swelling. Recommended increased fluid intake. He has a follow-up with Dr. Hollie Salk later in the week.  MEDICAL HISTORY:  Past Medical History:  Diagnosis Date  . Amyloidosis ([place])   . Arthritis   . CAD (coronary artery disease)    Lexiscan Myoview 6/14: Normal study, no scar or ischemia, EF 62%  . Colon polyp   . Coronary artery disease    moderate disease by cath 2011  . Diverticulosis of colon   . GERD (gastroesophageal reflux disease)   . Hearing loss   . Hiatal hernia   . Hyperlipidemia   . Hypertension   . MRSA (  methicillin resistant staph aureus) culture positive   . Seizure disorder ([place])   . Seizures ([place])    last in 1970s  Obstructive sleep apnea Gastroesophageal reflux disease Aortic stenosis Erectile dysfunction Peripheral neuropathy Seizure disorder   SURGICAL HISTORY: Past Surgical History:  Procedure Laterality Date  . BUNIONECTOMY    . CARPAL TUNNEL RELEASE   1994   left  . CERVICAL FUSION    . compression fracture    . FOOT SURGERY     right -twice, left foot once  . HERNIA REPAIR     Umbilical with PVP  . KNEE ARTHROSCOPY     left knee  . [place]   left - scope  . NASAL SEPTUM SURGERY    . NOSE SURGERY     twice  . ROTATOR CUFF REPAIR     twice, both shoulders  . ROTATOR CUFF REPAIR    . SPINE SURGERY     C2, C3, C4  . TRIGGER FINGER RELEASE     both hands, twice  . TRIGGER FINGER RELEASE    . VASECTOMY      SOCIAL HISTORY: Social History   Social History  . Marital status: Married    Spouse name: N/A  . Number of children: 2  . Years of education: 12   Occupational History  . Retired    Social History Main Topics  . Smoking status: Former Smoker    Quit date: 04/25/1969  . Smokeless tobacco: Former Systems developer  . Alcohol use 4.2 - 8.4 oz/week    7 - 14 Glasses of wine per week     Comment: socially shots of wiskey  . Drug use: No  . Sexual activity: Not on file   Other Topics Concern  . Not on file   Social History Narrative   ** Merged History Encounter **   Lives at home w/ his wife   Right-handed   Caffeine: occasional Pepsi      Patient is currently retired.   FAMILY HISTORY: Family History  Problem Relation Age of Onset  . Diabetes Father   . Heart disease Father   . Heart attack Father 54  . Hypertension Father   . Hyperlipidemia Unknown   . Diabetes Unknown   . Hypertension Unknown   . Seizures Unknown   . Thyroid disease Unknown   . Colon cancer Neg Hx   . Stroke Neg Hx     ALLERGIES:  is allergic to ramipril and ramipril.  MEDICATIONS:  Current Outpatient Prescriptions  Medication Sig Dispense Refill  . acyclovir (ZOVIRAX) 400 MG tablet Take 1 tablet (400 mg total) by mouth 2 (two) times daily. 60 tablet 3  . ALPRAZolam (XANAX) 0.5 MG tablet Take 1 tablet up to one hour prior to MRI for anxiety, may repeat if needed. Do not drive. 3 tablet 0  . aspirin 81 MG tablet Take 81 mg  by mouth daily.      . cholecalciferol (VITAMIN D) 1000 units tablet Take 1,000 Units by mouth daily.    . cyclophosphamide (CYTOXAN) 50 MG tablet Take 17 tablets (850 mg total) by mouth. Take on days 1,8,15 of chemo 1hr before or 2hr after meals every 21 days. AL Amyloidosis ICD:E85.81 51 tablet 3  . dexamethasone (DECADRON) 4 MG tablet Take 10 tablets (40 mg) on days 1, 8, and 15 of chemo. Repeat every 21 days. 30 tablet 3  . isosorbide mononitrate (IMDUR) 30 MG 24 hr tablet Take 0.5 tablets (  15 mg total) by mouth daily. 45 tablet 3  . levETIRAcetam (KEPPRA) 250 MG tablet Take 1 tab in am and 2 tabs in pm x 2 wks, then 1 tab 2x/day x 2 wks, then 1 tab daily x 2 wks. 104 tablet 0  . losartan (COZAAR) 100 MG tablet Take 1 tablet by mouth Daily.    . nitroGLYCERIN (NITROSTAT) 0.4 MG SL tablet Place 1 tablet (0.4 mg total) under the tongue every 5 (five) minutes as needed for chest pain. 25 tablet 6  . omeprazole (PRILOSEC) 20 MG capsule Take 20 mg by mouth daily.      . ondansetron (ZOFRAN) 8 MG tablet Take 1 tablet (8 mg total) by mouth 2 (two) times daily as needed for refractory nausea / vomiting. Start on D 4 and 11. Do not take on D8. 30 tablet 1  . potassium chloride SA (K-DUR,KLOR-CON) 20 MEQ tablet Take 20 mEq by mouth once.    . prochlorperazine (COMPAZINE) 10 MG tablet Take 1 tablet (10 mg total) by mouth every 6 (six) hours as needed (Nausea or vomiting). 30 tablet 1  . rosuvastatin (CRESTOR) 40 MG tablet Take 40 mg by mouth daily.    [name] Kitchen triamcinolone cream (KENALOG) 0.1 % Apply 1 application topically 2 (two) times daily as needed (FOR SKIN IRRITATION).      Current Facility-Administered Medications  Medication Dose Route Frequency Provider Last Rate Last Dose  . 0.9 %  sodium chloride infusion   Intravenous Continuous Brunetta Genera, MD        REVIEW OF SYSTEMS:    10 Point review of Systems was done is negative except as noted above.  PHYSICAL EXAMINATION: ECOG PERFORMANCE  STATUS: 1 - Symptomatic but completely ambulatory  . There were no vitals filed for this visit. There were no vitals filed for this visit. .There is no height or weight on file to calculate BMI.  GENERAL:alert, in no acute distress and comfortable SKIN: no acute rashes, no significant lesions EYES: conjunctiva are pink and non-injected, sclera anicteric OROPHARYNX: MMM, no exudates, no oropharyngeal erythema or ulceration, no macroglossia NECK: supple, mild JVD LYMPH:  no palpable lymphadenopathy in the cervical, axillary or inguinal regions LUNGS: clear to auscultation b/l with normal respiratory effort HEART: regular rate & rhythm ABDOMEN:  normoactive bowel sounds , non tender, not distended. Extremity: 1+ pitting pedal edema bilaterally PSYCH: alert & oriented x 3 with fluent speech NEURO: no focal motor/sensory deficits  LABORATORY DATA:  I have reviewed the data as listed  . CBC Latest Ref Rng & Units 10/17/2016 10/13/2016 10/10/2016  WBC 4.0 - 10.3 10e3/uL 5.0 5.0 6.0  Hemoglobin 13.0 - 17.1 g/dL 14.2 14.2 13.3  Hematocrit 38.4 - 49.9 % 41.5 41.0 39.6  Platelets 140 - 400 10e3/uL 211 317 402(H)    CMP Latest Ref Rng & Units 10/17/2016 10/13/2016 10/10/2016  Glucose 70 - 140 mg/dl 114 101 101  BUN 7.0 - 26.0 mg/dL 35.9(H) 30.1(H) 21.5  Creatinine 0.7 - 1.3 mg/dL 1.6(H) 1.5(H) 1.3  Sodium 136 - 145 mEq/L 141 143 142  Potassium 3.5 - 5.1 mEq/L 3.8 4.0 3.9  Chloride 96 - 112 mEq/L - - -  CO2 22 - 29 mEq/L '26 25 24  ' Calcium 8.4 - 10.4 mg/dL 9.3 9.2 8.8  Total Protein 6.4 - 8.3 g/dL 5.9(L) 5.8(L) 5.7(L)  Total Bilirubin 0.20 - 1.20 mg/dL 0.45 0.50 0.39  Alkaline Phos 40 - 150 U/L 93 102 95  AST 5 - 34 U/L  32 24 28  ALT 0 - 55 U/L '17 16 21   ' Component     Latest Ref Rng & Units 08/19/2016  Protime     10.6 - 13.4 Seconds 12.0  INR     2.00 - 3.50 1.00 (L)  Lovenox      No  LDH     125 - 245 U/L 207  Beta 2     0.6 - 2.4 mg/L 3.0 (H)  APTT     24 - 33 sec 29    Hepatitis B Surface Ag     Negative Negative  Hep B Core Ab, Tot     Negative Negative  Hep C Virus Ab     0.0 - 0.9 s/co ratio <0.1          RADIOGRAPHIC STUDIES: I have personally reviewed the radiological images as listed and agreed with the findings in the report. No results found. Result status: Edited Result - FINAL                              *[facility] [employer].                        [place], [place] [id number]                            [id number]  ------------------------------------------------------------------- Transthoracic Echocardiography  (Report amended )  Patient:    Alex [name], Alex [name] Alex #:       [id number] Study Date: 08/24/2016 Gender:     M Age:        22 Height:     180.3 cm Weight:     90.6 kg BSA:        2.15 m^2 Pt. Status: Room:   ATTENDING    Darlina Guys, MD  Willia Craze, Christopher  REFERRING    [place], [place], Outpatient  SONOGRAPHER  Roseanna Rainbow  cc:  ------------------------------------------------------------------- LV EF: 65% -   70%  ------------------------------------------------------------------- Indications:      Aortic stenosis 424.1.  ------------------------------------------------------------------- History:   PMH:  Amyloidosis.  Coronary artery disease.  Angina pectoris.  Risk factors:  Hypertension. Dyslipidemia.  ------------------------------------------------------------------- Study Conclusions  - Left ventricle: The cavity size was normal. Wall thickness was   increased in a pattern of mild LVH. Systolic function was   vigorous. The estimated ejection fraction was in the range of 65%   to 70%. Wall motion was normal; there were no regional wall   motion abnormalities. Doppler parameters are consistent with   abnormal left ventricular relaxation (grade 1 diastolic    dysfunction). - Aortic valve: Valve mobility was mildly restricted. - Aortic root: The aortic root was mildly dilated. - Mitral valve: Calcified annulus.  Impressions:  - Vigorous LV systolic function; mild diastolic dysfunction; mild   LVH; calcified aortic valve with no significant AS (peak velocity   2 m/s; mean gradient 9 mmHg); mildly dilated aortic root.    ASSESSMENT & PLAN:   76 year old male with above-mentioned multiple medical comorbidities with  #  1 Newly diagnosed biopsy proven renal and Systemic AL Amyloidosis BM Bx shows 9% plasma cells and amyloid deposits. Echo shows normal systolic function with only grade 1 diastolic dysfunction. Skeletal survey with no lytic lesions suggestive of multiple myeloma. Serum K/L ratio increased at about 5.13. #2 Newly diagnosed Nephrotic Syndrome - likely related to AL amyloidosis. As per nephrology records 24 urine showed 15 g of protein per day. Rpt random Urine protein/creatinine ratio from 5/14 showed decrease to 8.7g/g creatinine #3 dehydration due to decreased by mouth intake and Lasix Plan -Serum free light chain ratio normalizing with treatment suggesting good hematologic response. -Awaiting 24-hour UPEP from 10/10/2016. -Blood counts stable. -Minimal grade 1 neuropathy from Velcade. -continue Velcade and weekly dexaethasone and Cytoxan.  -SFLC and 24-hour urine after each cycle of treatment. We will treat to maximum response followed by maintenance therapy. -Normal saline 1 L over 1 hour today for dehydration. -He was recommended to hold his Lasix and monitor his daily weights and monitor for pedal edema. His gated with slightly softer blood pressures.  #3 abdominal discomfort- chronic along with nausea. Improved. -Further evaluation per primary care physician and Dr. Perry/gastroenterology. -Patient is scheduled for a colonoscopy which she would like to postpone. This is not unreasonable in the absence of any acute  abdominal symptoms at this time. Could reschedule colonoscopy for after induction treatment.  #4. Seizure disorder in her the patient has been on Dilantin from one 10 years. Patient knows that he has not had a seizure for very long time. -patient has been transitioned from dilantin to keppra. No breakthrough seizures -he has been advised not to drive for 6 months.  #5 Patient Active Problem List   Diagnosis Date Noted  . AL amyloidosis ([place]) 08/21/2016  . Umbilical hernia 40/37/5436  . DIZZINESS 02/19/2010  . CAD, NATIVE VESSEL 07/21/2009  . CHEST PAIN-UNSPECIFIED 07/21/2009  . HYPERLIPIDEMIA 04/30/2007  . HYPERTENSION 04/30/2007  . GASTROESOPHAGEAL REFLUX DISEASE 04/30/2007  . SEIZURE DISORDER 04/30/2007  . COMPRESSION FRACTURE, L1 VERTEBRA 04/30/2007  . DIVERTICULOSIS, COLON, HX OF 04/30/2007  . Other postprocedural status(V45.89) 04/30/2007  -Continue follow-up with primary care physician for management of other medical co-morbidities.  -Plz schedule remaining days of cycle 3, cycle 4 and cycle 5. -Return to clinic with Dr. [name] in 4 weeks. -Continue labs day 1 and day 8 every cycle.   All of the patients and his wife's questions were answered to their apparent satisfaction. They are agreeable with the plan as noted above .The patient knows to call the clinic with any problems, questions or concerns.  I spent 20 minutes counseling the patient face to face. The total time spent in the appointment was 25 minutes and more than 50% was on counseling and direct patient cares.    Sullivan Lone MD [place] AAHIVMS [facility] Hematology/Oncology Physician [facility]  (Office):       [phone] (Work cell):  [phone] (Fax):           [phone]

## 2016-10-18 ENCOUNTER — Encounter (INDEPENDENT_AMBULATORY_CARE_PROVIDER_SITE_OTHER): Payer: Self-pay | Admitting: Orthopaedic Surgery

## 2016-10-18 ENCOUNTER — Ambulatory Visit (INDEPENDENT_AMBULATORY_CARE_PROVIDER_SITE_OTHER): Payer: Medicare HMO

## 2016-10-18 ENCOUNTER — Ambulatory Visit (INDEPENDENT_AMBULATORY_CARE_PROVIDER_SITE_OTHER): Payer: Medicare HMO | Admitting: Orthopaedic Surgery

## 2016-10-18 VITALS — BP 124/75 | HR 63 | Ht 71.0 in | Wt 200.0 lb

## 2016-10-18 DIAGNOSIS — M549 Dorsalgia, unspecified: Secondary | ICD-10-CM

## 2016-10-18 DIAGNOSIS — M25562 Pain in left knee: Secondary | ICD-10-CM

## 2016-10-18 DIAGNOSIS — M454 Ankylosing spondylitis of thoracic region: Secondary | ICD-10-CM

## 2016-10-18 NOTE — Progress Notes (Signed)
Office Visit Note   Patient: Alex [name].           Date of Birth: 1940-11-05           MRN: [id number] Visit Date: 10/18/2016              Requested by: [name], MD [address] [place], [place] [address] PCP: [name], MD   Assessment & Plan: Visit Diagnoses:  1. Mid back pain   2. Acute pain of left knee     Plan: We reviewed x-rays with him with ankylosis. We discussed the occasional anti-flammable toys but is to be careful with coronary artery disease as well as past history of diverticulosis. We discussed extension stretching exercises and other stretching exercises. Will contact us if he develops any progressive weakness.  Follow-Up Instructions: No Follow-up on file.   Orders:  Orders Placed This Encounter  Procedures  . XR Knee 1-2 Views Left  . XR Thoracic Spine 2 View   No orders of the defined types were placed in this encounter.     Procedures: No procedures performed   Clinical Data: No additional findings.   Subjective: Chief Complaint  Patient presents with  . Middle Back - Pain  . 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 Patient states that the pain gets worse with bending particularly when he leans forward. He also had some left knee pain walking felt a pop of the he's not noticed any true locking in his need some pain with stepping and going up and down steps. No associated wrap rash or fever.  Review of Systems positive for a previous L1 compression fracture, hyperlipidemia, hypertension, CAD, seizure disorder, sleep apnea repair rotator cuff repair. Otherwise negative as it pertains was history of present illness.   Objective: Vital Signs: BP 124/75   Pulse 63   Ht 5\' 11"  (1.803 m)   Wt 200 lb (90.7 kg)   BMI 27.89 kg/m   Physical Exam  Constitutional: He is oriented to person,  place, and time. He appears well-developed and well-nourished.  HENT:  Head: Normocephalic and atraumatic.  Eyes: EOM are normal. Pupils are equal, round, and reactive to light.  Neck: No tracheal deviation present. No thyromegaly present.  Cardiovascular: Normal rate.   Pulmonary/Chest: Effort normal. He has no wheezes.  Abdominal: Soft. Bowel sounds are normal.  Musculoskeletal:  Healed rotator cuff repair incisions. Has good cervical range of motion. Has some tenderness palpation mid scapular region along the thoracic spine. Increased discomfort forward bending. Relief with extension. He has some medial joint line tenderness along his left knee collateral ligaments are stable. Reflexes are 2+. Negative sciatic notch tenderness.  Neurological: He is alert and oriented to person, place, and time.  Skin: Skin is warm and dry. Capillary refill takes less than 2 seconds.  Psychiatric: He has a normal mood and affect. His behavior is normal. Judgment and thought content normal.    Ortho Exam  Specialty Comments:  No specialty comments available.  Imaging: No results found.   PMFS History: Patient Active Problem List   Diagnosis Date Noted  . 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 Umbilical hernia 65/78/4696  . DIZZINESS 02/19/2010  . CAD, NATIVE VESSEL 07/21/2009  . CHEST PAIN-UNSPECIFIED 07/21/2009  . HYPERLIPIDEMIA 04/30/2007  . HYPERTENSION 04/30/2007  . GASTROESOPHAGEAL REFLUX  DISEASE 04/30/2007  . SEIZURE DISORDER 04/30/2007  . COMPRESSION FRACTURE, L1 VERTEBRA 04/30/2007  . DIVERTICULOSIS, COLON, HX OF 04/30/2007  . Other postprocedural status(V45.89) 04/30/2007   Past Medical History:  Diagnosis Date  . Amyloidosis ([place])   . Arthritis   . CAD (coronary artery disease)    Lexiscan Myoview 6/14: Normal study, no scar or ischemia, EF 62%  . Colon polyp   . Coronary artery disease    moderate disease by cath 2011  . Diverticulosis of colon   . GERD (gastroesophageal  reflux disease)   . Hearing loss   . Hiatal hernia   . Hyperlipidemia   . Hypertension   . MRSA (methicillin resistant staph aureus) culture positive   . Seizure disorder ([place])   . Seizures ([place])    last in 1970s    Family History  Problem Relation Age of Onset  . Diabetes Father   . Heart disease Father   . Heart attack Father 85  . Hypertension Father   . Hyperlipidemia Unknown   . Diabetes Unknown   . Hypertension Unknown   . Seizures Unknown   . Thyroid disease Unknown   . Colon cancer Neg Hx   . Stroke Neg Hx     Past Surgical History:  Procedure Laterality Date  . BUNIONECTOMY    . CARPAL TUNNEL RELEASE  1994   left  . CERVICAL FUSION    . compression fracture    . FOOT SURGERY     right -twice, left foot once  . HERNIA REPAIR     Umbilical with PVP  . KNEE ARTHROSCOPY     left knee  . [place]   left - scope  . NASAL SEPTUM SURGERY    . NOSE SURGERY     twice  . ROTATOR CUFF REPAIR     twice, both shoulders  . ROTATOR CUFF REPAIR    . SPINE SURGERY     C2, C3, C4  . TRIGGER FINGER RELEASE     both hands, twice  . TRIGGER FINGER RELEASE    . VASECTOMY     Social History   Occupational History  . Retired    Social History Main Topics  . Smoking status: Former Smoker    Quit date: 04/25/1969  . Smokeless tobacco: Former Systems developer  . Alcohol use 4.2 - 8.4 oz/week    7 - 14 Glasses of wine per week     Comment: socially shots of wiskey  . Drug use: No  . Sexual activity: Not on file

## 2016-10-19 ENCOUNTER — Telehealth: Payer: Self-pay

## 2016-10-19 ENCOUNTER — Emergency Department (HOSPITAL_COMMUNITY): Payer: Medicare HMO

## 2016-10-19 ENCOUNTER — Emergency Department (HOSPITAL_COMMUNITY)
Admission: EM | Admit: 2016-10-19 | Discharge: 2016-10-19 | Disposition: A | Discharge status: Home / Self Care | Payer: Medicare HMO | Attending: Emergency Medicine | Admitting: Emergency Medicine

## 2016-10-19 ENCOUNTER — Encounter (HOSPITAL_COMMUNITY): Payer: Self-pay | Admitting: *Deleted

## 2016-10-19 DIAGNOSIS — I2510 Atherosclerotic heart disease of native coronary artery without angina pectoris: Secondary | ICD-10-CM | POA: Insufficient documentation

## 2016-10-19 DIAGNOSIS — Z7982 Long term (current) use of aspirin: Secondary | ICD-10-CM | POA: Diagnosis not present

## 2016-10-19 DIAGNOSIS — Z87891 Personal history of nicotine dependence: Secondary | ICD-10-CM | POA: Insufficient documentation

## 2016-10-19 DIAGNOSIS — I10 Essential (primary) hypertension: Secondary | ICD-10-CM | POA: Insufficient documentation

## 2016-10-19 DIAGNOSIS — M545 Low back pain: Secondary | ICD-10-CM | POA: Diagnosis not present

## 2016-10-19 LAB — COMPREHENSIVE METABOLIC PANEL
ALBUMIN: 2.9 g/dL — AB (ref 3.5–5.0)
ALK PHOS: 93 U/L (ref 38–126)
ALT: 18 U/L (ref 17–63)
ANION GAP: 7 (ref 5–15)
AST: 33 U/L (ref 15–41)
BUN: 32 mg/dL — ABNORMAL HIGH (ref 6–20)
CALCIUM: 8.4 mg/dL — AB (ref 8.9–10.3)
CO2: 26 mmol/L (ref 22–32)
Chloride: 106 mmol/L (ref 101–111)
Creatinine, Ser: 1.61 mg/dL — ABNORMAL HIGH (ref 0.61–1.24)
GFR calc Af Amer: 47 mL/min — ABNORMAL LOW (ref >60)
GFR calc non Af Amer: 41 mL/min — ABNORMAL LOW (ref >60)
GLUCOSE: 109 mg/dL — AB (ref 65–99)
POTASSIUM: 3.8 mmol/L (ref 3.5–5.1)
Sodium: 139 mmol/L (ref 135–145)
Total Bilirubin: 0.7 mg/dL (ref 0.3–1.2)
Total Protein: 5.7 g/dL — ABNORMAL LOW (ref 6.5–8.1)

## 2016-10-19 LAB — CBC WITH DIFFERENTIAL/PLATELET
BASOS PCT: 0 %
Basophils Absolute: 0 10*3/uL (ref 0.0–0.1)
EOS ABS: 0.1 10*3/uL (ref 0.0–0.7)
Eosinophils Relative: 2 %
HCT: 39.4 % (ref 39.0–52.0)
HEMOGLOBIN: 13.8 g/dL (ref 13.0–17.0)
Lymphocytes Relative: 27 %
Lymphs Abs: 1.5 10*3/uL (ref 0.7–4.0)
MCH: 30.9 pg (ref 26.0–34.0)
MCHC: 35 g/dL (ref 30.0–36.0)
MCV: 88.1 fL (ref 78.0–100.0)
MONOS PCT: 13 %
Monocytes Absolute: 0.7 10*3/uL (ref 0.1–1.0)
NEUTROS PCT: 58 %
Neutro Abs: 3.3 10*3/uL (ref 1.7–7.7)
Platelets: 124 10*3/uL — ABNORMAL LOW (ref 150–400)
RBC: 4.47 MIL/uL (ref 4.22–5.81)
RDW: 14.6 % (ref 11.5–15.5)
WBC: 5.5 10*3/uL (ref 4.0–10.5)

## 2016-10-19 LAB — URINALYSIS, ROUTINE W REFLEX MICROSCOPIC
BILIRUBIN URINE: NEGATIVE
Glucose, UA: NEGATIVE mg/dL
Hgb urine dipstick: NEGATIVE
KETONES UR: NEGATIVE mg/dL
LEUKOCYTES UA: NEGATIVE
Nitrite: NEGATIVE
Protein, ur: >=300 mg/dL — AB
Specific Gravity, Urine: 1.012 (ref 1.005–1.030)
pH: 5 (ref 5.0–8.0)

## 2016-10-19 MED ORDER — SODIUM CHLORIDE 0.9 % IV BOLUS (SEPSIS)
500.0000 mL | Freq: Once | INTRAVENOUS | Status: AC
  Administered 2016-10-19: 500 mL via INTRAVENOUS

## 2016-10-19 MED ORDER — HYDROCODONE-ACETAMINOPHEN 5-325 MG PO TABS: 1.0000 | ORAL_TABLET | ORAL | 0 refills | Status: DC | PRN

## 2016-10-19 NOTE — ED Provider Notes (Signed)
[facility] DEPT Provider Note   CSN: [id number] Arrival date & time: 10/19/16  1350     History   Chief Complaint Chief Complaint  Patient presents with  . Back Pain    mid back pain     HPI Alex [name]. is a 76 y.o. male.  Patient with history of amyloidosis currently on chemotherapy presents with middle and lower back pain starting acutely 2 nights ago. Pain comes and goes and radiates to the bilateral upper legs. He had difficulty sleeping last night. No associated numbness, tingling, or weakness. When pain starts it is severe. No associated fevers, nausea or vomiting. No recent changes in his urine. No abdominal pain or chest pain. Patient denies warning symptoms of back pain including: fecal incontinence, urinary retention or overflow incontinence, night sweats, unexplained fevers or weight loss, recent trauma.    Patient saw his orthopedist yesterday who did an x-ray of the thoracic spine. This was negative for acute findings. Patient has been using heat at home as well as ibuprofen and Aleve without significant improvement.      Past Medical History:  Diagnosis Date  . Amyloidosis ([place])   . Arthritis   . CAD (coronary artery disease)    Lexiscan Myoview 6/14: Normal study, no scar or ischemia, EF 62%  . Colon polyp   . Coronary artery disease    moderate disease by cath 2011  . Diverticulosis of colon   . GERD (gastroesophageal reflux disease)   . Hearing loss   . Hiatal hernia   . Hyperlipidemia   . Hypertension   . MRSA (methicillin resistant staph aureus) culture positive   . Seizure disorder ([place])   . Seizures ([place])    last in 1970s    Patient Active Problem List   Diagnosis Date Noted  . 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 Umbilical hernia 40/98/1191  . DIZZINESS 02/19/2010  . CAD, NATIVE VESSEL 07/21/2009  . CHEST PAIN-UNSPECIFIED 07/21/2009  . HYPERLIPIDEMIA 04/30/2007  . HYPERTENSION 04/30/2007  . GASTROESOPHAGEAL REFLUX DISEASE  04/30/2007  . SEIZURE DISORDER 04/30/2007  . COMPRESSION FRACTURE, L1 VERTEBRA 04/30/2007  . DIVERTICULOSIS, COLON, HX OF 04/30/2007  . Other postprocedural status(V45.89) 04/30/2007    Past Surgical History:  Procedure Laterality Date  . BUNIONECTOMY    . CARPAL TUNNEL RELEASE  1994   left  . CERVICAL FUSION    . compression fracture    . FOOT SURGERY     right -twice, left foot once  . HERNIA REPAIR     Umbilical with PVP  . KNEE ARTHROSCOPY     left knee  . [place]   left - scope  . NASAL SEPTUM SURGERY    . NOSE SURGERY     twice  . ROTATOR CUFF REPAIR     twice, both shoulders  . ROTATOR CUFF REPAIR    . SPINE SURGERY     C2, C3, C4  . TRIGGER FINGER RELEASE     both hands, twice  . TRIGGER FINGER RELEASE    . VASECTOMY         Home Medications    Prior to Admission medications   Medication Sig Start Date End Date Taking? Authorizing Provider  acyclovir (ZOVIRAX) 400 MG tablet Take 1 tablet (400 mg total) by mouth 2 (two) times daily. 08/23/16   Brunetta Genera, MD  ALPRAZolam Duanne Moron) 0.5 MG tablet Take 1 tablet up to one hour prior to MRI for anxiety, may repeat  if needed. Do not drive. 10/05/16   Sater, Nanine Means, MD  aspirin 81 MG tablet Take 81 mg by mouth daily.      [provider]  cholecalciferol (VITAMIN D) 1000 units tablet Take 1,000 Units by mouth daily.    [provider]  cyclophosphamide (CYTOXAN) 50 MG capsule  09/18/16   [provider]  cyclophosphamide (CYTOXAN) 50 MG tablet Take 17 tablets (850 mg total) by mouth. Take on days 1,8,15 of chemo 1hr before or 2hr after meals every 21 days. AL Amyloidosis ICD:E85.81 09/07/16   Brunetta Genera, MD  dexamethasone (DECADRON) 4 MG tablet Take 10 tablets (40 mg) on days 1, 8, and 15 of chemo. Repeat every 21 days. 08/23/16   Brunetta Genera, MD  ezetimibe (ZETIA) 10 MG tablet  10/14/16   [provider]  furosemide (LASIX) 40 MG tablet  09/16/16    [provider]  isosorbide mononitrate (IMDUR) 30 MG 24 hr tablet Take 0.5 tablets (15 mg total) by mouth daily. 10/01/15   Burnell Blanks, MD  levETIRAcetam (KEPPRA) 250 MG tablet Take 1 tab in am and 2 tabs in pm x 2 wks, then 1 tab 2x/day x 2 wks, then 1 tab daily x 2 wks. 10/10/16   Melvenia Beam, MD  levETIRAcetam (KEPPRA) 500 MG tablet  09/29/16   [provider]  losartan (COZAAR) 100 MG tablet Take 1 tablet by mouth Daily. 11/09/10   [provider]  nitroGLYCERIN (NITROSTAT) 0.4 MG SL tablet Place 1 tablet (0.4 mg total) under the tongue every 5 (five) minutes as needed for chest pain. 07/10/15   Burtis Junes, NP  omeprazole (PRILOSEC) 20 MG capsule Take 20 mg by mouth daily.      [provider]  ondansetron (ZOFRAN) 8 MG tablet Take 1 tablet (8 mg total) by mouth 2 (two) times daily as needed for refractory nausea / vomiting. Start on D 4 and 11. Do not take on D8. 08/23/16   Brunetta Genera, MD  potassium chloride SA (K-DUR,KLOR-CON) 20 MEQ tablet Take 20 mEq by mouth once.    [provider]  prochlorperazine (COMPAZINE) 10 MG tablet Take 1 tablet (10 mg total) by mouth every 6 (six) hours as needed (Nausea or vomiting). 08/23/16   Brunetta Genera, MD  rosuvastatin (CRESTOR) 40 MG tablet Take 40 mg by mouth daily.    [provider]  triamcinolone cream (KENALOG) 0.1 % Apply 1 application topically 2 (two) times daily as needed (FOR SKIN IRRITATION).  08/09/13   [provider]    Family History Family History  Problem Relation Age of Onset  . Diabetes Father   . Heart disease Father   . Heart attack Father 41  . Hypertension Father   . Hyperlipidemia Unknown   . Diabetes Unknown   . Hypertension Unknown   . Seizures Unknown   . Thyroid disease Unknown   . Colon cancer Neg Hx   . Stroke Neg Hx     Social History Social History  Substance Use Topics  . Smoking status: Former Smoker    Quit  date: 04/25/1969  . Smokeless tobacco: Former Systems developer  . Alcohol use 4.2 - 8.4 oz/week    7 - 14 Glasses of wine per week     Comment: socially shots of wiskey     Allergies   Ramipril and Ramipril   Review of Systems Review of Systems  Constitutional: Negative for fever and  unexpected weight change.  HENT: Negative for rhinorrhea and sore throat.   Eyes: Negative for redness.  Respiratory: Negative for cough.   Cardiovascular: Negative for chest pain.  Gastrointestinal: Negative for abdominal pain, constipation, diarrhea, nausea and vomiting.       Neg for fecal incontinence  Genitourinary: Negative for difficulty urinating, dysuria, flank pain and hematuria.       Negative for urinary incontinence or retention  Musculoskeletal: Positive for back pain. Negative for myalgias.  Skin: Negative for rash.  Neurological: Negative for weakness, numbness and headaches.       Negative for saddle paresthesias      Physical Exam Updated Vital Signs BP (!) 140/97 (BP Location: Left Arm)   Pulse 63   Temp 97.5 F (36.4 C) (Oral)   Resp 14   SpO2 99%   Physical Exam  Constitutional: He appears well-developed and well-nourished.  HENT:  Head: Normocephalic and atraumatic.  Mouth/Throat: Oropharynx is clear and moist.  Eyes: Conjunctivae are normal. Right eye exhibits no discharge. Left eye exhibits no discharge.  Neck: Normal range of motion. Neck supple.  Cardiovascular: Normal rate, regular rhythm and normal heart sounds.   Pulmonary/Chest: Effort normal and breath sounds normal. No respiratory distress. He has no wheezes. He has no rales.  Abdominal: Soft. There is no tenderness.  Musculoskeletal:       Cervical back: He exhibits normal range of motion, no tenderness and no bony tenderness.       Thoracic back: He exhibits tenderness. He exhibits normal range of motion and no bony tenderness.       Lumbar back: He exhibits tenderness. He exhibits normal range of motion and no bony  tenderness.       Back:  Neurological: He is alert.  Skin: Skin is warm and dry.  Psychiatric: He has a normal mood and affect.  Nursing note and vitals reviewed.    ED Treatments / Results  Labs (all labs ordered are listed, but only abnormal results are displayed) Labs Reviewed  CBC WITH DIFFERENTIAL/PLATELET - Abnormal; Notable for the following:       Result Value   Platelets 124 (*)    All other components within normal limits  COMPREHENSIVE METABOLIC PANEL - Abnormal; Notable for the following:    Glucose, Bld 109 (*)    BUN 32 (*)    Creatinine, Ser 1.61 (*)    Calcium 8.4 (*)    Total Protein 5.7 (*)    Albumin 2.9 (*)    GFR calc non Af Amer 41 (*)    GFR calc Af Amer 47 (*)    All other components within normal limits  URINALYSIS, ROUTINE W REFLEX MICROSCOPIC - Abnormal; Notable for the following:    Protein, ur >=300 (*)    Bacteria, UA RARE (*)    Squamous Epithelial / LPF 0-5 (*)    All other components within normal limits    EKG  EKG Interpretation None       Radiology Dg Lumbar Spine Complete  Result Date: 10/19/2016 CLINICAL DATA:  Mid to lower back pain. EXAM: LUMBAR SPINE - COMPLETE 4+ VIEW COMPARISON:  None. FINDINGS: There are 5 non rib-bearing lumbar type vertebral bodies. Normal alignment of lumbar spine. No anterolisthesis or retrolisthesis. No definite pars defects. Age-indeterminate moderate (approximately 30%) compression deformity involving the superior endplate of L1 with associated focal kyphosis at this location. Remaining lumbar vertebral body heights appear preserved Mild to moderate multilevel lumbar spine DDD, worse at  L2-L3 and L3-L4 with disc space height loss, endplate irregularity and sclerosis. Limited visualization of the bilateral SI joints and hips is normal. Atherosclerotic plaque within a potentially mildly aneurysmal abdominal aorta measuring approximately 3.1 cm in maximal diameter. IMPRESSION: 1. Age-indeterminate moderate  (approximately 30%) compression deformity involving the superior endplate of L1. Correlation for point tenderness at this location is recommended. 2. Mild-to-moderate multilevel lumbar spine DDD. 3.  Aortic Atherosclerosis (ICD10-I70.0). 4. Potential mild aneurysmal dilatation of the abdominal aorta measuring approximately 3.1 cm in maximal diameter. Further evaluation with dedicated nonemergent aortic ultrasound is recommended. Electronically Signed   By: Sandi Mariscal M.D.   On: 10/19/2016 18:40   Xr Knee 1-2 Views Left  Result Date: 10/18/2016 Standing AP x-rays both the lateral left knee x-rays were obtained. This shows some joint line narrowing some flattening of the condyle marginal osteophytes and some left knee medial meniscus chondrocalcinosis. Impression: The mild OA changes with CPPD and medial meniscus  Xr Thoracic Spine 2 View  Result Date: 10/18/2016 AP lateral thoracic spine x-rays obtained that shows significant spurring marginal osteophytes worse on the right than left at multiple levels some levels have consolidated and fused. assessment: Thoracic varying levels of ankylosis consistent with the ankylosing spondylitis.   Procedures Procedures (including critical care time)  Medications Ordered in ED Medications  sodium chloride 0.9 % bolus 500 mL (0 mLs Intravenous Stopped 10/19/16 1949)     Initial Impression / Assessment and Plan / ED Course  I have reviewed the triage vital signs and the nursing notes.  Pertinent labs & imaging results that were available during my care of the patient were reviewed by me and considered in my medical decision making (see chart for details).     Patient seen and examined. Work-up initiated. Discussed with Dr. Laverta Baltimore who will see.   Vital signs reviewed and are as follows: BP (!) 140/97 (BP Location: Left Arm)   Pulse 63   Temp 97.5 F (36.4 C) (Oral)   Resp 14   SpO2 99%   7:51 PM patient and wife updated on all results including  slight dilation of aorta noted on x-ray. Encouraged follow-up with PCP for follow-up ultrasound.  Will discharge to home with prescription for Vicodin. Use pain medication only under direct supervision at the lowest possible dose needed to control your pain.   Patient urged to follow-up with PCP if pain does not improve with treatment and rest or if pain becomes recurrent. Urged to return with worsening severe pain, loss of bowel or bladder control, trouble walking.   The patient verbalizes understanding and agrees with the plan.   Final Clinical Impressions(s) / ED Diagnoses   Final diagnoses:  Acute midline low back pain, with sciatica presence unspecified   Patient with history of amyloidosis, on chemotherapy, with midline back pain with radiation into the legs. Pain is actually much improved upon emergency department arrival. Patient has been ambulatory. Imaging shows chronic compression fracture from remote injury per patient history. Patient with minimal bulge of calcified aorta noted on x-ray. Otherwise, patient's symptoms are not consistent with aortic aneurysm. Patient discussed with and seen by attending physician. Lab work is the patient's baseline and reassuring tonight.  No dangerous or life-threatening conditions suspected or identified by history, physical exam, and by work-up. No indications for hospitalization identified.     New Prescriptions New Prescriptions   HYDROCODONE-ACETAMINOPHEN (NORCO/VICODIN) 5-325 MG TABLET    Take 1-2 tablets by mouth every 4 (four) hours as needed.  Carlisle Cater, [name] 10/19/16 1953    Margette Fast, MD 10/20/16 [phone]

## 2016-10-19 NOTE — Telephone Encounter (Signed)
Wife called regarding bone pain last night into this morning. She suggested potentially claritin, but Dr. Julien Nordmann said that will not likely work since it is not related to neulasta injection. Plan for now is to try tylenol. Minimal ibuprofen if necessary. Will discuss with Dr. [name] today.

## 2016-10-19 NOTE — Telephone Encounter (Signed)
Returning call from pt around 1300 regarding back pain radiating into lower legs. Advised pt to seek treatment at [facility] ED. Additionally 2 new lumps noted on pt neck. Told pt's wife to keep Dr. Grier Mitts office updated regarding plan to admit at the hospital or treatment added to send home. Wife verbalized agreement and understanding.

## 2016-10-19 NOTE — Discharge Instructions (Signed)
Please read and follow all provided instructions.  Your diagnoses today include:  1. Acute midline low back pain, with sciatica presence unspecified     Tests performed today include:  Vital signs - see below for your results today  Blood counts and electrolytes - shows unchanged kidney function  X-ray of the lower back - shows chronic compression fracture and slight bulge of the aorta, this needs to be rechecked by your primary care doctor  Medications prescribed:   Vicodin (hydrocodone/acetaminophen) - narcotic pain medication  DO NOT drive or perform any activities that require you to be awake and alert because this medicine can make you drowsy. BE VERY CAREFUL not to take multiple medicines containing Tylenol (also called acetaminophen). Doing so can lead to an overdose which can damage your liver and cause liver failure and possibly death.  Take any prescribed medications only as directed.  Home care instructions:   Follow any educational materials contained in this packet  Please rest, use ice or heat on your back for the next several days  Do not lift, push, pull anything more than 10 pounds for the next week  Follow-up instructions: Please follow-up with your primary care provider in the next 3 daysfor further evaluation of your symptoms.   Return instructions:  SEEK IMMEDIATE MEDICAL ATTENTION IF YOU HAVE:  New numbness, tingling, weakness, or problem with the use of your arms or legs  Severe back pain not relieved with medications  Loss control of your bowels or bladder  Increasing pain in any areas of the body (such as chest or abdominal pain)  Shortness of breath, dizziness, or fainting.   Worsening nausea (feeling sick to your stomach), vomiting, fever, or sweats  Any other emergent concerns regarding your health   Additional Information:  Your vital signs today were: BP (!) 133/91 (BP Location: Left Arm)    Pulse (!) 56    Temp 97.6 F (36.4 C)  (Oral)    Resp 17    SpO2 95%  If your blood pressure (BP) was elevated above 135/85 this visit, please have this repeated by your doctor within one month. --------------

## 2016-10-19 NOTE — ED Notes (Signed)
Pt reports last cancer treatment was Friday.

## 2016-10-19 NOTE — ED Triage Notes (Signed)
Patient is alert and oriented x4.  He is complaining of mid to lower back pain that started yesterday.  Patient states that he recently had chemotherapy Monday and currently rates his pain 8 of 10.  Patient feels that his amyloidosis could be the cause of his pain .

## 2016-10-20 ENCOUNTER — Ambulatory Visit (HOSPITAL_BASED_OUTPATIENT_CLINIC_OR_DEPARTMENT_OTHER): Payer: Medicare HMO

## 2016-10-20 VITALS — BP 114/69 | HR 73 | Temp 97.7°F | Resp 18

## 2016-10-20 DIAGNOSIS — R110 Nausea: Secondary | ICD-10-CM | POA: Diagnosis not present

## 2016-10-20 DIAGNOSIS — E854 Organ-limited amyloidosis: Secondary | ICD-10-CM

## 2016-10-20 DIAGNOSIS — E669 Obesity, unspecified: Secondary | ICD-10-CM | POA: Diagnosis not present

## 2016-10-20 DIAGNOSIS — Z5112 Encounter for antineoplastic immunotherapy: Secondary | ICD-10-CM | POA: Diagnosis not present

## 2016-10-20 DIAGNOSIS — N08 Glomerular disorders in diseases classified elsewhere: Secondary | ICD-10-CM

## 2016-10-20 DIAGNOSIS — E859 Amyloidosis, unspecified: Secondary | ICD-10-CM | POA: Diagnosis not present

## 2016-10-20 DIAGNOSIS — G40909 Epilepsy, unspecified, not intractable, without status epilepticus: Secondary | ICD-10-CM | POA: Diagnosis not present

## 2016-10-20 DIAGNOSIS — E8581 Light chain (AL) amyloidosis: Secondary | ICD-10-CM

## 2016-10-20 DIAGNOSIS — N049 Nephrotic syndrome with unspecified morphologic changes: Secondary | ICD-10-CM | POA: Diagnosis not present

## 2016-10-20 MED ORDER — ONDANSETRON HCL 8 MG PO TABS
ORAL_TABLET | ORAL | Status: AC
  Filled 2016-10-20: qty 1

## 2016-10-20 MED ORDER — BORTEZOMIB CHEMO SQ INJECTION 3.5 MG (2.5MG/ML)
1.3000 mg/m2 | Freq: Once | INTRAMUSCULAR | Status: AC
  Administered 2016-10-20: 2.75 mg via SUBCUTANEOUS
  Filled 2016-10-20: qty 2.75

## 2016-10-20 MED ORDER — ONDANSETRON HCL 8 MG PO TABS
8.0000 mg | ORAL_TABLET | Freq: Once | ORAL | Status: AC
  Administered 2016-10-20: 8 mg via ORAL

## 2016-10-20 NOTE — Patient Instructions (Signed)
[facility] Discharge Instructions for Patients Receiving Chemotherapy  Today you received the following chemotherapy agents Velcade. To help prevent nausea and vomiting after your treatment, we encourage you to take your nausea medication as directed.  If you develop nausea and vomiting that is not controlled by your nausea medication, call the clinic.   BELOW ARE SYMPTOMS THAT SHOULD BE REPORTED IMMEDIATELY:  *FEVER GREATER THAN 100.5 F  *CHILLS WITH OR WITHOUT FEVER  NAUSEA AND VOMITING THAT IS NOT CONTROLLED WITH YOUR NAUSEA MEDICATION  *UNUSUAL SHORTNESS OF BREATH  *UNUSUAL BRUISING OR BLEEDING  TENDERNESS IN MOUTH AND THROAT WITH OR WITHOUT PRESENCE OF ULCERS  *URINARY PROBLEMS  *BOWEL PROBLEMS  UNUSUAL RASH Items with * indicate a potential emergency and should be followed up as soon as possible.  Feel free to call the clinic you have any questions or concerns. The clinic phone number is [phone].  Please show the CHEMO ALERT CARD at check-in to the Emergency Department and triage nurse.    

## 2016-10-24 ENCOUNTER — Telehealth: Payer: Self-pay | Admitting: *Deleted

## 2016-10-24 DIAGNOSIS — E8581 Light chain (AL) amyloidosis: Secondary | ICD-10-CM | POA: Diagnosis not present

## 2016-10-24 NOTE — Telephone Encounter (Signed)
Addendum to previous note:  Pt called back stating PCP is also out of town.  Pt asked if he could be seen by another provider.  This RN explained that there is no symptom management provider and he would need to be evaluated by ED.   Pt again stated that nodules were not localized, that they are noted in different areas, "they look like a pimple without the center".  Verified still no fever/chills.  Pt stated he did not want to go to ED.  This RN advised against that decision.  Informed pt that this could be a reaction or an infection (pt on active chemotherapy).  Pt stated he would rather "keep an eye on it".  This RN instructed pt he must go to ED if symptoms persisted/worsened/fever developed.  Pt verbalized understanding.

## 2016-10-24 NOTE — Telephone Encounter (Signed)
Pt called stating he has had several "nodules" appear on different areas of the body including arm, head, neck, stomach.  Pt stated they are raised and around the size of a dime.  Pt stated a couple of them have itched, but not persistently.  Pt states no fever or chills.  This RN advised pt he should have them looked at.  Pt informed Dr. [name] out of town for two weeks.  Advised pt to go to PCP or ED.  Pt verbalized understanding and will try to be seen by PCP.

## 2016-10-25 ENCOUNTER — Ambulatory Visit (HOSPITAL_BASED_OUTPATIENT_CLINIC_OR_DEPARTMENT_OTHER): Payer: Medicare HMO | Admitting: Oncology

## 2016-10-25 ENCOUNTER — Other Ambulatory Visit: Payer: Self-pay | Admitting: *Deleted

## 2016-10-25 ENCOUNTER — Encounter: Payer: Self-pay | Admitting: Oncology

## 2016-10-25 ENCOUNTER — Telehealth (INDEPENDENT_AMBULATORY_CARE_PROVIDER_SITE_OTHER): Payer: Self-pay | Admitting: Orthopaedic Surgery

## 2016-10-25 ENCOUNTER — Other Ambulatory Visit: Payer: Medicare HMO

## 2016-10-25 DIAGNOSIS — R21 Rash and other nonspecific skin eruption: Secondary | ICD-10-CM

## 2016-10-25 DIAGNOSIS — R69 Illness, unspecified: Secondary | ICD-10-CM | POA: Diagnosis not present

## 2016-10-25 DIAGNOSIS — L739 Follicular disorder, unspecified: Secondary | ICD-10-CM

## 2016-10-25 DIAGNOSIS — E8581 Light chain (AL) amyloidosis: Secondary | ICD-10-CM

## 2016-10-25 MED ORDER — CLINDAMYCIN PHOSPHATE 1 % EX SOLN: Freq: Two times a day (BID) | CUTANEOUS | 0 refills | Status: DC

## 2016-10-25 MED ORDER — MUPIROCIN CALCIUM 2 % EX CREA: 1.0000 "application " | TOPICAL_CREAM | Freq: Two times a day (BID) | CUTANEOUS | 0 refills | Status: DC

## 2016-10-25 MED ORDER — PROCHLORPERAZINE MALEATE 10 MG PO TABS: 10.0000 mg | ORAL_TABLET | Freq: Four times a day (QID) | ORAL | 1 refills | Status: DC | PRN

## 2016-10-25 NOTE — Telephone Encounter (Signed)
Records 1988 present mailed Department of [facility]

## 2016-10-25 NOTE — Progress Notes (Signed)
SYMPTOM MANAGEMENT CLINIC    Chief Complaint: Rash  HPI:  Alex [name]. 76 y.o. male diagnosed with amyloidosis currently on chemotherapy with Velcade, Cytoxan, and dexamethasone. This is his off week of his treatment. The patient contacted our office reporting several "nodules" on his chest, neck, and head. Patient states of these areas started about one and a half weeks ago. Areas are reddened and slightly raised. He denies any itching, burning, or drainage from these areas. He states that the 2 areas that were about the size of a pea on his neck have now resolved. The patient's wife thinks that the areas are improving slightly. He denies any fevers or chills. He feels very well overall today and, in fact, much better than he has felt since being on chemotherapy.   No history exists.    Review of Systems  Constitutional: Negative.   HENT: Negative.   Eyes: Negative.   Respiratory: Negative.   Cardiovascular: Negative.   Gastrointestinal: Negative.   Genitourinary: Negative.   Musculoskeletal: Negative.   Skin:       As noted in history of present illness.  Neurological: Negative.   Endo/Heme/Allergies: Negative.   Psychiatric/Behavioral: Negative.     Past Medical History:  Diagnosis Date  . Amyloidosis ([place])   . Arthritis   . CAD (coronary artery disease)    Lexiscan Myoview 6/14: Normal study, no scar or ischemia, EF 62%  . Colon polyp   . Coronary artery disease    moderate disease by cath 2011  . Diverticulosis of colon   . GERD (gastroesophageal reflux disease)   . Hearing loss   . Hiatal hernia   . Hyperlipidemia   . Hypertension   . MRSA (methicillin resistant staph aureus) culture positive   . Seizure disorder ([place])   . Seizures ([place])    last in 1970s    Past Surgical History:  Procedure Laterality Date  . BUNIONECTOMY    . CARPAL TUNNEL RELEASE  1994   left  . CERVICAL FUSION    . compression fracture    . FOOT SURGERY     right -twice,  left foot once  . HERNIA REPAIR     Umbilical with PVP  . KNEE ARTHROSCOPY     left knee  . [place]   left - scope  . NASAL SEPTUM SURGERY    . NOSE SURGERY     twice  . ROTATOR CUFF REPAIR     twice, both shoulders  . ROTATOR CUFF REPAIR    . SPINE SURGERY     C2, C3, C4  . TRIGGER FINGER RELEASE     both hands, twice  . TRIGGER FINGER RELEASE    . VASECTOMY      has HYPERLIPIDEMIA; HYPERTENSION; CAD, NATIVE VESSEL; GASTROESOPHAGEAL REFLUX DISEASE; SEIZURE DISORDER; DIZZINESS; CHEST PAIN-UNSPECIFIED; COMPRESSION FRACTURE, L1 VERTEBRA; DIVERTICULOSIS, COLON, HX OF; Other postprocedural status(V45.89); Umbilical hernia; and AL amyloidosis ([place]) on his problem list.    is allergic to ramipril and ramipril.  Allergies as of 10/25/2016      Reactions   Ramipril    REACTION: causes cough   Ramipril Other (See Comments)   Causes patient cough Pt knows as ALTACE      Medication List       Accurate as of 10/25/16 11:41 AM. Always use your most recent med list.          acyclovir 400 MG tablet Commonly known as:  ZOVIRAX Take  1 tablet (400 mg total) by mouth 2 (two) times daily.   ALPRAZolam 0.5 MG tablet Commonly known as:  XANAX Take 1 tablet up to one hour prior to MRI for anxiety, may repeat if needed. Do not drive.   aspirin 81 MG tablet Take 81 mg by mouth daily.   atorvastatin 40 MG tablet Commonly known as:  LIPITOR Take 40 mg by mouth every evening.   cholecalciferol 1000 units tablet Commonly known as:  VITAMIN D Take 1,000 Units by mouth daily.   cyclophosphamide 50 MG tablet Commonly known as:  CYTOXAN Take 17 tablets (850 mg total) by mouth. Take on days 1,8,15 of chemo 1hr before or 2hr after meals every 21 days. AL Amyloidosis ICD:E85.81   dexamethasone 4 MG tablet Commonly known as:  DECADRON Take 10 tablets (40 mg) on days 1, 8, and 15 of chemo. Repeat every 21 days.   ezetimibe 10 MG tablet Commonly known as:  ZETIA Take 10 mg by  mouth daily.   HYDROcodone-acetaminophen 5-325 MG tablet Commonly known as:  NORCO/VICODIN Take 1-2 tablets by mouth every 4 (four) hours as needed.   isosorbide mononitrate 30 MG 24 hr tablet Commonly known as:  IMDUR Take 0.5 tablets (15 mg total) by mouth daily.   levETIRAcetam 250 MG tablet Commonly known as:  KEPPRA Take 1 tab in am and 2 tabs in pm x 2 wks, then 1 tab 2x/day x 2 wks, then 1 tab daily x 2 wks.   losartan 100 MG tablet Commonly known as:  COZAAR Take 1 tablet by mouth every evening.   nitroGLYCERIN 0.4 MG SL tablet Commonly known as:  NITROSTAT Place 1 tablet (0.4 mg total) under the tongue every 5 (five) minutes as needed for chest pain.   omeprazole 20 MG capsule Commonly known as:  PRILOSEC Take 20 mg by mouth daily.   ondansetron 8 MG tablet Commonly known as:  ZOFRAN Take 1 tablet (8 mg total) by mouth 2 (two) times daily as needed for refractory nausea / vomiting. Start on D 4 and 11. Do not take on D8.   potassium chloride SA 20 MEQ tablet Commonly known as:  K-DUR,KLOR-CON Take 20 mEq by mouth every evening.   prochlorperazine 10 MG tablet Commonly known as:  COMPAZINE Take 1 tablet (10 mg total) by mouth every 6 (six) hours as needed (Nausea or vomiting).   triamcinolone cream 0.1 % Commonly known as:  KENALOG Apply 1 application topically 2 (two) times daily as needed (FOR SKIN IRRITATION).        PHYSICAL EXAMINATION  Oncology Vitals 10/25/2016 10/20/2016  Height 180 cm -  Weight 92.579 kg -  Weight (lbs) 204 lbs 2 oz -  BMI (kg/m2) 28.47 kg/m2 -  Temp 97.7 97.7  Pulse 81 73  Resp 18 18  SpO2 98 96  BSA (m2) 2.15 m2 -   BP Readings from Last 2 Encounters:  10/25/16 120/66  10/20/16 114/69    Physical Exam  Constitutional: He is oriented to person, place, and time and well-developed, well-nourished, and in no distress. No distress.  HENT:  Head: Normocephalic and atraumatic.  Eyes: Conjunctivae are normal. Right eye  exhibits no discharge. Left eye exhibits no discharge. No scleral icterus.  Neck: Normal range of motion. Neck supple.  Neurological: He is alert and oriented to person, place, and time. He exhibits normal muscle tone.  Skin: Skin is warm and dry. He is not diaphoretic.  Raised, reddened areas up to 1.5  cm noted on his chest, neck, and top of his head. There is no drainage. No excoriation.  Psychiatric: Mood, memory, affect and judgment normal.  Vitals reviewed.   LABORATORY DATA:. No visits with results within 3 Day(s) from this visit.  Latest known visit with results is:  Admission on 10/19/2016, Discharged on 10/19/2016  Component Date Value Ref Range Status  . WBC 10/19/2016 5.5  4.0 - 10.5 K/uL Final  . RBC 10/19/2016 4.47  4.22 - 5.81 MIL/uL Final  . Hemoglobin 10/19/2016 13.8  13.0 - 17.0 g/dL Final  . HCT 10/19/2016 39.4  39.0 - 52.0 % Final  . MCV 10/19/2016 88.1  78.0 - 100.0 fL Final  . MCH 10/19/2016 30.9  26.0 - 34.0 pg Final  . MCHC 10/19/2016 35.0  30.0 - 36.0 g/dL Final  . RDW 10/19/2016 14.6  11.5 - 15.5 % Final  . Platelets 10/19/2016 124* 150 - 400 K/uL Final  . Neutrophils Relative % 10/19/2016 58  % Final  . Neutro Abs 10/19/2016 3.3  1.7 - 7.7 K/uL Final  . Lymphocytes Relative 10/19/2016 27  % Final  . Lymphs Abs 10/19/2016 1.5  0.7 - 4.0 K/uL Final  . Monocytes Relative 10/19/2016 13  % Final  . Monocytes Absolute 10/19/2016 0.7  0.1 - 1.0 K/uL Final  . Eosinophils Relative 10/19/2016 2  % Final  . Eosinophils Absolute 10/19/2016 0.1  0.0 - 0.7 K/uL Final  . Basophils Relative 10/19/2016 0  % Final  . Basophils Absolute 10/19/2016 0.0  0.0 - 0.1 K/uL Final  . Sodium 10/19/2016 139  135 - 145 mmol/L Final  . Potassium 10/19/2016 3.8  3.5 - 5.1 mmol/L Final  . Chloride 10/19/2016 106  101 - 111 mmol/L Final  . CO2 10/19/2016 26  22 - 32 mmol/L Final  . Glucose, Bld 10/19/2016 109* 65 - 99 mg/dL Final  . BUN 10/19/2016 32* 6 - 20 mg/dL Final  . Creatinine,  Ser 10/19/2016 1.61* 0.61 - 1.24 mg/dL Final  . Calcium 10/19/2016 8.4* 8.9 - 10.3 mg/dL Final  . Total Protein 10/19/2016 5.7* 6.5 - 8.1 g/dL Final  . Albumin 10/19/2016 2.9* 3.5 - 5.0 g/dL Final  . AST 10/19/2016 33  15 - 41 U/L Final  . ALT 10/19/2016 18  17 - 63 U/L Final  . Alkaline Phosphatase 10/19/2016 93  38 - 126 U/L Final  . Total Bilirubin 10/19/2016 0.7  0.3 - 1.2 mg/dL Final  . GFR calc non Af Amer 10/19/2016 41* >60 mL/min Final  . GFR calc Af Amer 10/19/2016 47* >60 mL/min Final   Comment: (NOTE) The eGFR has been calculated using the CKD EPI equation. This calculation has not been validated in all clinical situations. eGFR's persistently <60 mL/min signify possible Chronic Kidney Disease.   . Anion gap 10/19/2016 7  5 - 15 Final  . Color, Urine 10/19/2016 YELLOW  YELLOW Final  . APPearance 10/19/2016 CLEAR  CLEAR Final  . Specific Gravity, Urine 10/19/2016 1.012  1.005 - 1.030 Final  . pH 10/19/2016 5.0  5.0 - 8.0 Final  . Glucose, UA 10/19/2016 NEGATIVE  NEGATIVE mg/dL Final  . Hgb urine dipstick 10/19/2016 NEGATIVE  NEGATIVE Final  . Bilirubin Urine 10/19/2016 NEGATIVE  NEGATIVE Final  . Ketones, ur 10/19/2016 NEGATIVE  NEGATIVE mg/dL Final  . Protein, ur 10/19/2016 >=300* NEGATIVE mg/dL Final  . Nitrite 10/19/2016 NEGATIVE  NEGATIVE Final  . Leukocytes, UA 10/19/2016 NEGATIVE  NEGATIVE Final  . RBC / HPF 10/19/2016 0-5  0 - 5 RBC/hpf Final  . WBC, UA 10/19/2016 0-5  0 - 5 WBC/hpf Final  . Bacteria, UA 10/19/2016 RARE* NONE SEEN Final  . Squamous Epithelial / LPF 10/19/2016 0-5* NONE SEEN Final  . Hyaline Casts, UA 10/19/2016 PRESENT   Final    RADIOGRAPHIC STUDIES: No results found.  ASSESSMENT/PLAN:    No problem-specific Assessment & Plan notes found for this encounter.  This is 76 year old gentleman with amyloidosis currently being treated with Velcade, Cytoxan, and dexamethasone. The patient presents with a rash to his neck, chest, and head. Overall,  these areas are improving. Rash seems consistent with folliculitis. We will treat with topical antibiotics including Bactroban 2% to be applied twice a day with Cleocin gel to be applied twice a day. The patient was instructed to monitor these areas and if they worsen to let our office know.  Patient stated understanding of all instructions; and was in agreement with this plan of care. The patient knows to call the clinic with any problems, questions or concerns.   Mikey Bussing, NP 10/25/2016

## 2016-10-27 DIAGNOSIS — R69 Illness, unspecified: Secondary | ICD-10-CM | POA: Diagnosis not present

## 2016-10-31 ENCOUNTER — Other Ambulatory Visit (HOSPITAL_COMMUNITY): Payer: Medicare HMO

## 2016-10-31 ENCOUNTER — Ambulatory Visit (HOSPITAL_BASED_OUTPATIENT_CLINIC_OR_DEPARTMENT_OTHER): Payer: Medicare HMO

## 2016-10-31 ENCOUNTER — Other Ambulatory Visit (HOSPITAL_BASED_OUTPATIENT_CLINIC_OR_DEPARTMENT_OTHER): Payer: Medicare HMO

## 2016-10-31 VITALS — BP 103/57 | HR 74 | Temp 98.0°F

## 2016-10-31 DIAGNOSIS — E854 Organ-limited amyloidosis: Secondary | ICD-10-CM

## 2016-10-31 DIAGNOSIS — Z5112 Encounter for antineoplastic immunotherapy: Secondary | ICD-10-CM

## 2016-10-31 DIAGNOSIS — E8581 Light chain (AL) amyloidosis: Secondary | ICD-10-CM

## 2016-10-31 DIAGNOSIS — N08 Glomerular disorders in diseases classified elsewhere: Secondary | ICD-10-CM

## 2016-10-31 LAB — COMPREHENSIVE METABOLIC PANEL
ALT: 17 U/L (ref 0–55)
ANION GAP: 11 meq/L (ref 3–11)
AST: 24 U/L (ref 5–34)
Albumin: 2.5 g/dL — ABNORMAL LOW (ref 3.5–5.0)
Alkaline Phosphatase: 81 U/L (ref 40–150)
BUN: 19 mg/dL (ref 7.0–26.0)
CALCIUM: 8.6 mg/dL (ref 8.4–10.4)
CO2: 24 mEq/L (ref 22–29)
CREATININE: 1 mg/dL (ref 0.7–1.3)
Chloride: 110 mEq/L — ABNORMAL HIGH (ref 98–109)
EGFR: 71 mL/min/{1.73_m2} — ABNORMAL LOW (ref >90)
Glucose: 109 mg/dl (ref 70–140)
Potassium: 3.9 mEq/L (ref 3.5–5.1)
Sodium: 144 mEq/L (ref 136–145)
TOTAL PROTEIN: 5.3 g/dL — AB (ref 6.4–8.3)
Total Bilirubin: 0.67 mg/dL (ref 0.20–1.20)

## 2016-10-31 LAB — CBC & DIFF AND RETIC
BASO%: 0.5 % (ref 0.0–2.0)
Basophils Absolute: 0 10*3/uL (ref 0.0–0.1)
EOS%: 3.7 % (ref 0.0–7.0)
Eosinophils Absolute: 0.2 10*3/uL (ref 0.0–0.5)
HEMATOCRIT: 37.5 % — AB (ref 38.4–49.9)
HGB: 12.9 g/dL — ABNORMAL LOW (ref 13.0–17.1)
IMMATURE RETIC FRACT: 10.5 % (ref 3.00–10.60)
LYMPH%: 15.8 % (ref 14.0–49.0)
MCH: 31.2 pg (ref 27.2–33.4)
MCHC: 34.4 g/dL (ref 32.0–36.0)
MCV: 90.6 fL (ref 79.3–98.0)
MONO#: 0.7 10*3/uL (ref 0.1–0.9)
MONO%: 11.7 % (ref 0.0–14.0)
NEUT%: 68.3 % (ref 39.0–75.0)
NEUTROS ABS: 4.3 10*3/uL (ref 1.5–6.5)
PLATELETS: 291 10*3/uL (ref 140–400)
RBC: 4.14 10*6/uL — ABNORMAL LOW (ref 4.20–5.82)
RDW: 15.4 % — ABNORMAL HIGH (ref 11.0–14.6)
RETIC CT ABS: 96.05 10*3/uL — AB (ref 34.80–93.90)
Retic %: 2.32 % — ABNORMAL HIGH (ref 0.80–1.80)
WBC: 6.3 10*3/uL (ref 4.0–10.3)
lymph#: 1 10*3/uL (ref 0.9–3.3)
nRBC: 0 % (ref 0–0)

## 2016-10-31 MED ORDER — ONDANSETRON HCL 8 MG PO TABS
ORAL_TABLET | ORAL | Status: AC
  Filled 2016-10-31: qty 1

## 2016-10-31 MED ORDER — BORTEZOMIB CHEMO SQ INJECTION 3.5 MG (2.5MG/ML)
1.3000 mg/m2 | Freq: Once | INTRAMUSCULAR | Status: AC
  Administered 2016-10-31: 2.75 mg via SUBCUTANEOUS
  Filled 2016-10-31: qty 2.75

## 2016-10-31 MED ORDER — ONDANSETRON HCL 8 MG PO TABS
8.0000 mg | ORAL_TABLET | Freq: Once | ORAL | Status: AC
  Administered 2016-10-31: 8 mg via ORAL

## 2016-10-31 NOTE — Progress Notes (Signed)
Medications reviewed with patient and wife, pt has not been taking Dexamethasone. Instructed him to resume today with #10 tablets as prescribed. Pt was unsure if he is taking Acyclovir. Instructions reviewed. They will call office with any additional questions once they review medications at home.

## 2016-10-31 NOTE — Patient Instructions (Addendum)
[facility] Discharge Instructions for Patients Receiving Chemotherapy  Today you received the following chemotherapy agents: Velcade. Continue Cytoxan and Dexamethasone weekly.   To help prevent nausea and vomiting after your treatment, we encourage you to take your nausea medication: Zofran. Take one every 8 hours as needed.  If you develop nausea and vomiting that is not controlled by your nausea medication, call the clinic.   BELOW ARE SYMPTOMS THAT SHOULD BE REPORTED IMMEDIATELY:  *FEVER GREATER THAN 100.5 F  *CHILLS WITH OR WITHOUT FEVER  NAUSEA AND VOMITING THAT IS NOT CONTROLLED WITH YOUR NAUSEA MEDICATION  *UNUSUAL SHORTNESS OF BREATH  *UNUSUAL BRUISING OR BLEEDING  TENDERNESS IN MOUTH AND THROAT WITH OR WITHOUT PRESENCE OF ULCERS  *URINARY PROBLEMS  *BOWEL PROBLEMS  UNUSUAL RASH Items with * indicate a potential emergency and should be followed up as soon as possible.  Feel free to call the clinic should you have any questions or concerns. The clinic phone number is [phone].  Please show the [facility] at check-in to the Emergency Department and triage nurse.

## 2016-11-03 ENCOUNTER — Ambulatory Visit (HOSPITAL_BASED_OUTPATIENT_CLINIC_OR_DEPARTMENT_OTHER): Payer: Medicare HMO

## 2016-11-03 VITALS — BP 108/65 | HR 70 | Temp 97.9°F

## 2016-11-03 DIAGNOSIS — N08 Glomerular disorders in diseases classified elsewhere: Secondary | ICD-10-CM

## 2016-11-03 DIAGNOSIS — E8581 Light chain (AL) amyloidosis: Secondary | ICD-10-CM

## 2016-11-03 DIAGNOSIS — Z5112 Encounter for antineoplastic immunotherapy: Secondary | ICD-10-CM | POA: Diagnosis not present

## 2016-11-03 DIAGNOSIS — E860 Dehydration: Secondary | ICD-10-CM

## 2016-11-03 DIAGNOSIS — E854 Organ-limited amyloidosis: Secondary | ICD-10-CM

## 2016-11-03 MED ORDER — SODIUM CHLORIDE 0.9 % IV SOLN: Freq: Once | INTRAVENOUS | Status: DC

## 2016-11-03 MED ORDER — BORTEZOMIB CHEMO SQ INJECTION 3.5 MG (2.5MG/ML)
1.3000 mg/m2 | Freq: Once | INTRAMUSCULAR | Status: AC
  Administered 2016-11-03: 2.75 mg via SUBCUTANEOUS
  Filled 2016-11-03: qty 2.75

## 2016-11-03 MED ORDER — SODIUM CHLORIDE 0.9 % IV SOLN
500.0000 mL | Freq: Once | INTRAVENOUS | Status: AC
  Administered 2016-11-03: 500 mL via INTRAVENOUS

## 2016-11-03 MED ORDER — ONDANSETRON HCL 4 MG/2ML IJ SOLN: 8.0000 mg | Freq: Once | INTRAMUSCULAR | Status: DC

## 2016-11-03 MED ORDER — ONDANSETRON HCL 4 MG/2ML IJ SOLN
INTRAMUSCULAR | Status: AC
  Filled 2016-11-03: qty 4

## 2016-11-03 NOTE — Patient Instructions (Signed)
[facility] Discharge Instructions for Patients Receiving Chemotherapy  Today you received the following chemotherapy agents: Velcade  To help prevent nausea and vomiting after your treatment, we encourage you to take your nausea medication as prescribed. If you develop nausea and vomiting that is not controlled by your nausea medication, call the clinic.   BELOW ARE SYMPTOMS THAT SHOULD BE REPORTED IMMEDIATELY:  *FEVER GREATER THAN 100.5 F  *CHILLS WITH OR WITHOUT FEVER  NAUSEA AND VOMITING THAT IS NOT CONTROLLED WITH YOUR NAUSEA MEDICATION  *UNUSUAL SHORTNESS OF BREATH  *UNUSUAL BRUISING OR BLEEDING  TENDERNESS IN MOUTH AND THROAT WITH OR WITHOUT PRESENCE OF ULCERS  *URINARY PROBLEMS  *BOWEL PROBLEMS  UNUSUAL RASH Items with * indicate a potential emergency and should be followed up as soon as possible.  Feel free to call the clinic you have any questions or concerns. The clinic phone number is [phone].  Please show the [facility] at check-in to the Emergency Department and triage nurse.  Dehydration, Adult Dehydration is when there is not enough fluid or water in your body. This happens when you lose more fluids than you take in. Dehydration can range from mild to very bad. It should be treated right away to keep it from getting very bad. Symptoms of mild dehydration may include:  Thirst.  Dry lips.  Slightly dry mouth.  Dry, warm skin.  Dizziness. Symptoms of moderate dehydration may include:  Very dry mouth.  Muscle cramps.  Dark pee (urine). Pee may be the color of tea.  Your body making less pee.  Your eyes making fewer tears.  Heartbeat that is uneven or faster than normal (palpitations).  Headache.  Light-headedness, especially when you stand up from sitting.  Fainting (syncope). Symptoms of very bad dehydration may include:  Changes in skin, such as: ? Cold and clammy skin. ? Blotchy (mottled) or pale  skin. ? Skin that does not quickly return to normal after being lightly pinched and let go (poor skin turgor).  Changes in body fluids, such as: ? Feeling very thirsty. ? Your eyes making fewer tears. ? Not sweating when body temperature is high, such as in hot weather. ? Your body making very little pee.  Changes in vital signs, such as: ? Weak pulse. ? Pulse that is more than 100 beats a minute when you are sitting still. ? Fast breathing. ? Low blood pressure.  Other changes, such as: ? Sunken eyes. ? Cold hands and feet. ? Confusion. ? Lack of energy (lethargy). ? Trouble waking up from sleep. ? Short-term weight loss. ? Unconsciousness. Follow these instructions at home:  If told by your doctor, drink an ORS: ? Make an ORS by using instructions on the package. ? Start by drinking small amounts, about  cup (120 mL) every 5-10 minutes. ? Slowly drink more until you have had the amount that your doctor said to have.  Drink enough clear fluid to keep your pee clear or pale yellow. If you were told to drink an ORS, finish the ORS first, then start slowly drinking clear fluids. Drink fluids such as: ? Water. Do not drink only water by itself. Doing that can make the salt (sodium) level in your body get too low (hyponatremia). ? Ice chips. ? Fruit juice that you have added water to (diluted). ? Low-calorie sports drinks.  Avoid: ? Alcohol. ? Drinks that have a lot of sugar. These include high-calorie sports drinks, fruit juice that does not have  water added, and soda. ? Caffeine. ? Foods that are greasy or have a lot of fat or sugar.  Take over-the-counter and prescription medicines only as told by your doctor.  Do not take salt tablets. Doing that can make the salt level in your body get too high (hypernatremia).  Eat foods that have minerals (electrolytes). Examples include bananas, oranges, potatoes, tomatoes, and spinach.  Keep all follow-up visits as told by your  doctor. This is important. Contact a doctor if:  You have belly (abdominal) pain that: ? Gets worse. ? Stays in one area (localizes).  You have a rash.  You have a stiff neck.  You get angry or annoyed more easily than normal (irritability).  You are more sleepy than normal.  You have a harder time waking up than normal.  You feel: ? Weak. ? Dizzy. ? Very thirsty.  You have peed (urinated) only a small amount of very dark pee during 6-8 hours. Get help right away if:  You have symptoms of very bad dehydration.  You cannot drink fluids without throwing up (vomiting).  Your symptoms get worse with treatment.  You have a fever.  You have a very bad headache.  You are throwing up or having watery poop (diarrhea) and it: ? Gets worse. ? Does not go away.  You have blood or something green (bile) in your throw-up.  You have blood in your poop (stool). This may cause poop to look black and tarry.  You have not peed in 6-8 hours.  You pass out (faint).  Your heart rate when you are sitting still is more than 100 beats a minute.  You have trouble breathing. This information is not intended to replace advice given to you by your health care provider. Make sure you discuss any questions you have with your health care provider. Document Released: 02/05/2009 Document Revised: 10/30/2015 Document Reviewed: 06/05/2015 Elsevier Interactive Patient Education  2018 [employer].

## 2016-11-03 NOTE — Progress Notes (Signed)
Patient came in reporting increased fatigue and weakness. Reported decreased appetite. Denies any difficulty drinking fluids. Consulted Dr. Earlie Server. 8mg  IV zofran ordered along with 593ml of NS. Patient reported that he already took 8mg  oral zofran around 0730. No IV zofran administered. Patient to continue antiemetics at home as prescribed. Voiced understanding.  Wylene Simmer, BSN, RN 11/03/2016 9:11 AM

## 2016-11-07 ENCOUNTER — Ambulatory Visit: Payer: Medicare HMO | Admitting: Nutrition

## 2016-11-07 ENCOUNTER — Ambulatory Visit (HOSPITAL_BASED_OUTPATIENT_CLINIC_OR_DEPARTMENT_OTHER): Payer: Medicare HMO

## 2016-11-07 ENCOUNTER — Other Ambulatory Visit (HOSPITAL_BASED_OUTPATIENT_CLINIC_OR_DEPARTMENT_OTHER): Payer: Medicare HMO

## 2016-11-07 VITALS — BP 99/64 | HR 74 | Temp 97.8°F | Resp 21

## 2016-11-07 DIAGNOSIS — E8581 Light chain (AL) amyloidosis: Secondary | ICD-10-CM

## 2016-11-07 DIAGNOSIS — N08 Glomerular disorders in diseases classified elsewhere: Secondary | ICD-10-CM

## 2016-11-07 DIAGNOSIS — E860 Dehydration: Secondary | ICD-10-CM | POA: Diagnosis not present

## 2016-11-07 DIAGNOSIS — E854 Organ-limited amyloidosis: Secondary | ICD-10-CM

## 2016-11-07 DIAGNOSIS — R079 Chest pain, unspecified: Secondary | ICD-10-CM | POA: Diagnosis not present

## 2016-11-07 DIAGNOSIS — Z5112 Encounter for antineoplastic immunotherapy: Secondary | ICD-10-CM

## 2016-11-07 LAB — COMPREHENSIVE METABOLIC PANEL
ALBUMIN: 2.6 g/dL — AB (ref 3.5–5.0)
ALK PHOS: 79 U/L (ref 40–150)
ALT: 19 U/L (ref 0–55)
ANION GAP: 10 meq/L (ref 3–11)
AST: 27 U/L (ref 5–34)
BILIRUBIN TOTAL: 0.52 mg/dL (ref 0.20–1.20)
BUN: 26.5 mg/dL — ABNORMAL HIGH (ref 7.0–26.0)
CO2: 21 meq/L — AB (ref 22–29)
CREATININE: 1.4 mg/dL — AB (ref 0.7–1.3)
Calcium: 9 mg/dL (ref 8.4–10.4)
Chloride: 107 mEq/L (ref 98–109)
EGFR: 51 mL/min/{1.73_m2} — AB (ref >90)
Glucose: 126 mg/dl (ref 70–140)
Potassium: 3.7 mEq/L (ref 3.5–5.1)
Sodium: 138 mEq/L (ref 136–145)
TOTAL PROTEIN: 5.7 g/dL — AB (ref 6.4–8.3)

## 2016-11-07 LAB — CBC & DIFF AND RETIC
BASO%: 0.1 % (ref 0.0–2.0)
BASOS ABS: 0 10*3/uL (ref 0.0–0.1)
EOS ABS: 0 10*3/uL (ref 0.0–0.5)
EOS%: 0.5 % (ref 0.0–7.0)
HEMATOCRIT: 36.3 % — AB (ref 38.4–49.9)
HEMOGLOBIN: 12.6 g/dL — AB (ref 13.0–17.1)
IMMATURE RETIC FRACT: 10.5 % (ref 3.00–10.60)
LYMPH%: 2.3 % — AB (ref 14.0–49.0)
MCH: 31.2 pg (ref 27.2–33.4)
MCHC: 34.7 g/dL (ref 32.0–36.0)
MCV: 89.9 fL (ref 79.3–98.0)
MONO#: 0.1 10*3/uL (ref 0.1–0.9)
MONO%: 1.4 % (ref 0.0–14.0)
NEUT#: 7.5 10*3/uL — ABNORMAL HIGH (ref 1.5–6.5)
NEUT%: 95.7 % — AB (ref 39.0–75.0)
RBC: 4.04 10*6/uL — AB (ref 4.20–5.82)
RDW: 15.2 % — ABNORMAL HIGH (ref 11.0–14.6)
RETIC CT ABS: 34.74 10*3/uL — AB (ref 34.80–93.90)
Retic %: 0.86 % (ref 0.80–1.80)
WBC: 7.8 10*3/uL (ref 4.0–10.3)
lymph#: 0.2 10*3/uL — ABNORMAL LOW (ref 0.9–3.3)
nRBC: 3 % — ABNORMAL HIGH (ref 0–0)

## 2016-11-07 LAB — TECHNOLOGIST REVIEW

## 2016-11-07 MED ORDER — ONDANSETRON HCL 8 MG PO TABS
ORAL_TABLET | ORAL | Status: AC
  Filled 2016-11-07: qty 1

## 2016-11-07 MED ORDER — ONDANSETRON HCL 8 MG PO TABS
8.0000 mg | ORAL_TABLET | Freq: Once | ORAL | Status: AC
  Administered 2016-11-07: 8 mg via ORAL

## 2016-11-07 MED ORDER — BORTEZOMIB CHEMO SQ INJECTION 3.5 MG (2.5MG/ML)
1.3000 mg/m2 | Freq: Once | INTRAMUSCULAR | Status: AC
  Administered 2016-11-07: 2.75 mg via SUBCUTANEOUS
  Filled 2016-11-07: qty 2.75

## 2016-11-07 MED ORDER — SODIUM CHLORIDE 0.9 % IV SOLN
1000.0000 mL | Freq: Once | INTRAVENOUS | Status: AC
  Administered 2016-11-07: 1000 mL via INTRAVENOUS

## 2016-11-07 NOTE — Progress Notes (Signed)
Nutrition follow-up completed with patient receiving treatment for kidney AL amyloidosis with nephrotic syndrome.  Patient reports nausea has improved. He does report constipation. He has been trying to eat and drink more.  However, he has noted his blood pressure is very low. No recent weight.  Nutrition diagnosis: Inadequate oral intake continues.  Intervention: Encouraged patient to continue increased fluids and high-calorie, high-protein foods. Recommended small frequent meals and snacks. Encouraged patient to continue nausea medications. Educated patient to follow a bowel regimen to avoid constipation. Teach back method used.  Monitoring, evaluation, goals: Patient will work to increase calories and protein for weight maintenance and adequate fluid intake.  Next visit: Monday, August 6 during infusion.  **Disclaimer: This note was dictated with voice recognition software. Similar sounding words can inadvertently be transcribed and this note may contain transcription errors which may not have been corrected upon publication of note.**

## 2016-11-07 NOTE — Progress Notes (Signed)
Pt dehydrated, BP 77/55. Reviewed with MD, VO for 1 l NS. Ok to treat with Velcade.

## 2016-11-07 NOTE — Patient Instructions (Signed)
Dehydration, Adult Dehydration is when there is not enough fluid or water in your body. This happens when you lose more fluids than you take in. Dehydration can range from mild to very bad. It should be treated right away to keep it from getting very bad. Symptoms of mild dehydration may include:  Thirst.  Dry lips.  Slightly dry mouth.  Dry, warm skin.  Dizziness. Symptoms of moderate dehydration may include:  Very dry mouth.  Muscle cramps.  Dark pee (urine). Pee may be the color of tea.  Your body making less pee.  Your eyes making fewer tears.  Heartbeat that is uneven or faster than normal (palpitations).  Headache.  Light-headedness, especially when you stand up from sitting.  Fainting (syncope). Symptoms of very bad dehydration may include:  Changes in skin, such as: ? Cold and clammy skin. ? Blotchy (mottled) or pale skin. ? Skin that does not quickly return to normal after being lightly pinched and let go (poor skin turgor).  Changes in body fluids, such as: ? Feeling very thirsty. ? Your eyes making fewer tears. ? Not sweating when body temperature is high, such as in hot weather. ? Your body making very little pee.  Changes in vital signs, such as: ? Weak pulse. ? Pulse that is more than 100 beats a minute when you are sitting still. ? Fast breathing. ? Low blood pressure.  Other changes, such as: ? Sunken eyes. ? Cold hands and feet. ? Confusion. ? Lack of energy (lethargy). ? Trouble waking up from sleep. ? Short-term weight loss. ? Unconsciousness. Follow these instructions at home:  If told by your doctor, drink an ORS: ? Make an ORS by using instructions on the package. ? Start by drinking small amounts, about  cup (120 mL) every 5-10 minutes. ? Slowly drink more until you have had the amount that your doctor said to have.  Drink enough clear fluid to keep your pee clear or pale yellow. If you were told to drink an ORS, finish the ORS  first, then start slowly drinking clear fluids. Drink fluids such as: ? Water. Do not drink only water by itself. Doing that can make the salt (sodium) level in your body get too low (hyponatremia). ? Ice chips. ? Fruit juice that you have added water to (diluted). ? Low-calorie sports drinks.  Avoid: ? Alcohol. ? Drinks that have a lot of sugar. These include high-calorie sports drinks, fruit juice that does not have water added, and soda. ? Caffeine. ? Foods that are greasy or have a lot of fat or sugar.  Take over-the-counter and prescription medicines only as told by your doctor.  Do not take salt tablets. Doing that can make the salt level in your body get too high (hypernatremia).  Eat foods that have minerals (electrolytes). Examples include bananas, oranges, potatoes, tomatoes, and spinach.  Keep all follow-up visits as told by your doctor. This is important. Contact a doctor if:  You have belly (abdominal) pain that: ? Gets worse. ? Stays in one area (localizes).  You have a rash.  You have a stiff neck.  You get angry or annoyed more easily than normal (irritability).  You are more sleepy than normal.  You have a harder time waking up than normal.  You feel: ? Weak. ? Dizzy. ? Very thirsty.  You have peed (urinated) only a small amount of very dark pee during 6-8 hours. Get help right away if:  You have symptoms of   very bad dehydration.  You cannot drink fluids without throwing up (vomiting).  Your symptoms get worse with treatment.  You have a fever.  You have a very bad headache.  You are throwing up or having watery poop (diarrhea) and it: ? Gets worse. ? Does not go away.  You have blood or something green (bile) in your throw-up.  You have blood in your poop (stool). This may cause poop to look black and tarry.  You have not peed in 6-8 hours.  You pass out (faint).  Your heart rate when you are sitting still is more than 100 beats a  minute.  You have trouble breathing. This information is not intended to replace advice given to you by your health care provider. Make sure you discuss any questions you have with your health care provider. Document Released: 02/05/2009 Document Revised: 10/30/2015 Document Reviewed: 06/05/2015 Elsevier Interactive Patient Education  2018 [place] Discharge Instructions for Patients Receiving Chemotherapy  Today you received the following chemotherapy agents: Velcade  To help prevent nausea and vomiting after your treatment, we encourage you to take your nausea medication as prescribed. If you develop nausea and vomiting that is not controlled by your nausea medication, call the clinic.   BELOW ARE SYMPTOMS THAT SHOULD BE REPORTED IMMEDIATELY:  *FEVER GREATER THAN 100.5 F  *CHILLS WITH OR WITHOUT FEVER  NAUSEA AND VOMITING THAT IS NOT CONTROLLED WITH YOUR NAUSEA MEDICATION  *UNUSUAL SHORTNESS OF BREATH  *UNUSUAL BRUISING OR BLEEDING  TENDERNESS IN MOUTH AND THROAT WITH OR WITHOUT PRESENCE OF ULCERS  *URINARY PROBLEMS  *BOWEL PROBLEMS  UNUSUAL RASH Items with * indicate a potential emergency and should be followed up as soon as possible.  Feel free to call the clinic you have any questions or concerns. The clinic phone number is [phone].  Please show the [facility] at check-in to the Emergency Department and triage nurse.  Dehydration, Adult Dehydration is when there is not enough fluid or water in your body. This happens when you lose more fluids than you take in. Dehydration can range from mild to very bad. It should be treated right away to keep it from getting very bad. Symptoms of mild dehydration may include:  Thirst.  Dry lips.  Slightly dry mouth.  Dry, warm skin.  Dizziness. Symptoms of moderate dehydration may include:  Very dry mouth.  Muscle cramps.  Dark pee (urine). Pee may be the color of  tea.  Your body making less pee.  Your eyes making fewer tears.  Heartbeat that is uneven or faster than normal (palpitations).  Headache.  Light-headedness, especially when you stand up from sitting.  Fainting (syncope). Symptoms of very bad dehydration may include:  Changes in skin, such as: ? Cold and clammy skin. ? Blotchy (mottled) or pale skin. ? Skin that does not quickly return to normal after being lightly pinched and let go (poor skin turgor).  Changes in body fluids, such as: ? Feeling very thirsty. ? Your eyes making fewer tears. ? Not sweating when body temperature is high, such as in hot weather. ? Your body making very little pee.  Changes in vital signs, such as: ? Weak pulse. ? Pulse that is more than 100 beats a minute when you are sitting still. ? Fast breathing. ? Low blood pressure.  Other changes, such as: ? Sunken eyes. ? Cold hands and feet. ? Confusion. ? Lack of energy (lethargy). ? Trouble waking up from  sleep. ? Short-term weight loss. ? Unconsciousness. Follow these instructions at home:  If told by your doctor, drink an ORS: ? Make an ORS by using instructions on the package. ? Start by drinking small amounts, about  cup (120 mL) every 5-10 minutes. ? Slowly drink more until you have had the amount that your doctor said to have.  Drink enough clear fluid to keep your pee clear or pale yellow. If you were told to drink an ORS, finish the ORS first, then start slowly drinking clear fluids. Drink fluids such as: ? Water. Do not drink only water by itself. Doing that can make the salt (sodium) level in your body get too low (hyponatremia). ? Ice chips. ? Fruit juice that you have added water to (diluted). ? Low-calorie sports drinks.  Avoid: ? Alcohol. ? Drinks that have a lot of sugar. These include high-calorie sports drinks, fruit juice that does not have water added, and soda. ? Caffeine. ? Foods that are greasy or have a lot of  fat or sugar.  Take over-the-counter and prescription medicines only as told by your doctor.  Do not take salt tablets. Doing that can make the salt level in your body get too high (hypernatremia).  Eat foods that have minerals (electrolytes). Examples include bananas, oranges, potatoes, tomatoes, and spinach.  Keep all follow-up visits as told by your doctor. This is important. Contact a doctor if:  You have belly (abdominal) pain that: ? Gets worse. ? Stays in one area (localizes).  You have a rash.  You have a stiff neck.  You get angry or annoyed more easily than normal (irritability).  You are more sleepy than normal.  You have a harder time waking up than normal.  You feel: ? Weak. ? Dizzy. ? Very thirsty.  You have peed (urinated) only a small amount of very dark pee during 6-8 hours. Get help right away if:  You have symptoms of very bad dehydration.  You cannot drink fluids without throwing up (vomiting).  Your symptoms get worse with treatment.  You have a fever.  You have a very bad headache.  You are throwing up or having watery poop (diarrhea) and it: ? Gets worse. ? Does not go away.  You have blood or something green (bile) in your throw-up.  You have blood in your poop (stool). This may cause poop to look black and tarry.  You have not peed in 6-8 hours.  You pass out (faint).  Your heart rate when you are sitting still is more than 100 beats a minute.  You have trouble breathing. This information is not intended to replace advice given to you by your health care provider. Make sure you discuss any questions you have with your health care provider. Document Released: 02/05/2009 Document Revised: 10/30/2015 Document Reviewed: 06/05/2015 Elsevier Interactive Patient Education  2018 [employer].

## 2016-11-08 LAB — KAPPA/LAMBDA LIGHT CHAINS
IG LAMBDA FREE LIGHT CHAIN: 10.8 mg/L (ref 5.7–26.3)
Ig Kappa Free Light Chain: 21.7 mg/L — ABNORMAL HIGH (ref 3.3–19.4)
Kappa/Lambda FluidC Ratio: 2.01 — ABNORMAL HIGH (ref 0.26–1.65)

## 2016-11-10 ENCOUNTER — Ambulatory Visit (HOSPITAL_BASED_OUTPATIENT_CLINIC_OR_DEPARTMENT_OTHER): Payer: Medicare HMO

## 2016-11-10 VITALS — BP 94/60 | HR 70 | Temp 97.8°F | Resp 18

## 2016-11-10 DIAGNOSIS — Z5112 Encounter for antineoplastic immunotherapy: Secondary | ICD-10-CM | POA: Diagnosis not present

## 2016-11-10 DIAGNOSIS — E854 Organ-limited amyloidosis: Secondary | ICD-10-CM

## 2016-11-10 DIAGNOSIS — E8581 Light chain (AL) amyloidosis: Secondary | ICD-10-CM

## 2016-11-10 DIAGNOSIS — E860 Dehydration: Secondary | ICD-10-CM

## 2016-11-10 DIAGNOSIS — N08 Glomerular disorders in diseases classified elsewhere: Secondary | ICD-10-CM | POA: Diagnosis not present

## 2016-11-10 DIAGNOSIS — R079 Chest pain, unspecified: Secondary | ICD-10-CM | POA: Diagnosis not present

## 2016-11-10 MED ORDER — BORTEZOMIB CHEMO SQ INJECTION 3.5 MG (2.5MG/ML)
1.3000 mg/m2 | Freq: Once | INTRAMUSCULAR | Status: AC
  Administered 2016-11-10: 2.75 mg via SUBCUTANEOUS
  Filled 2016-11-10: qty 2.75

## 2016-11-10 MED ORDER — ONDANSETRON HCL 8 MG PO TABS: 8.0000 mg | ORAL_TABLET | Freq: Once | ORAL | Status: DC

## 2016-11-10 MED ORDER — SODIUM CHLORIDE 0.9 % IV SOLN
Freq: Once | INTRAVENOUS | Status: AC
  Administered 2016-11-10: 10:00:00 via INTRAVENOUS

## 2016-11-10 NOTE — Patient Instructions (Signed)
[facility] Discharge Instructions for Patients Receiving Chemotherapy  Today you received the following chemotherapy agents: Velcade  To help prevent nausea and vomiting after your treatment, we encourage you to take your nausea medication as prescribed. If you develop nausea and vomiting that is not controlled by your nausea medication, call the clinic.   BELOW ARE SYMPTOMS THAT SHOULD BE REPORTED IMMEDIATELY:  *FEVER GREATER THAN 100.5 F  *CHILLS WITH OR WITHOUT FEVER  NAUSEA AND VOMITING THAT IS NOT CONTROLLED WITH YOUR NAUSEA MEDICATION  *UNUSUAL SHORTNESS OF BREATH  *UNUSUAL BRUISING OR BLEEDING  TENDERNESS IN MOUTH AND THROAT WITH OR WITHOUT PRESENCE OF ULCERS  *URINARY PROBLEMS  *BOWEL PROBLEMS  UNUSUAL RASH Items with * indicate a potential emergency and should be followed up as soon as possible.  Feel free to call the clinic you have any questions or concerns. The clinic phone number is [phone].  Please show the [place] at check-in to the Emergency Department and triage nurse.  Dehydration, Adult Dehydration is when there is not enough fluid or water in your body. This happens when you lose more fluids than you take in. Dehydration can range from mild to very bad. It should be treated right away to keep it from getting very bad. Symptoms of mild dehydration may include:  Thirst.  Dry lips.  Slightly dry mouth.  Dry, warm skin.  Dizziness. Symptoms of moderate dehydration may include:  Very dry mouth.  Muscle cramps.  Dark pee (urine). Pee may be the color of tea.  Your body making less pee.  Your eyes making fewer tears.  Heartbeat that is uneven or faster than normal (palpitations).  Headache.  Light-headedness, especially when you stand up from sitting.  Fainting (syncope). Symptoms of very bad dehydration may include:  Changes in skin, such as: ? Cold and clammy skin. ? Blotchy (mottled) or pale  skin. ? Skin that does not quickly return to normal after being lightly pinched and let go (poor skin turgor).  Changes in body fluids, such as: ? Feeling very thirsty. ? Your eyes making fewer tears. ? Not sweating when body temperature is high, such as in hot weather. ? Your body making very little pee.  Changes in vital signs, such as: ? Weak pulse. ? Pulse that is more than 100 beats a minute when you are sitting still. ? Fast breathing. ? Low blood pressure.  Other changes, such as: ? Sunken eyes. ? Cold hands and feet. ? Confusion. ? Lack of energy (lethargy). ? Trouble waking up from sleep. ? Short-term weight loss. ? Unconsciousness. Follow these instructions at home:  If told by your doctor, drink an ORS: ? Make an ORS by using instructions on the package. ? Start by drinking small amounts, about  cup (120 mL) every 5-10 minutes. ? Slowly drink more until you have had the amount that your doctor said to have.  Drink enough clear fluid to keep your pee clear or pale yellow. If you were told to drink an ORS, finish the ORS first, then start slowly drinking clear fluids. Drink fluids such as: ? Water. Do not drink only water by itself. Doing that can make the salt (sodium) level in your body get too low (hyponatremia). ? Ice chips. ? Fruit juice that you have added water to (diluted). ? Low-calorie sports drinks.  Avoid: ? Alcohol. ? Drinks that have a lot of sugar. These include high-calorie sports drinks, fruit juice that does not have  water added, and soda. ? Caffeine. ? Foods that are greasy or have a lot of fat or sugar.  Take over-the-counter and prescription medicines only as told by your doctor.  Do not take salt tablets. Doing that can make the salt level in your body get too high (hypernatremia).  Eat foods that have minerals (electrolytes). Examples include bananas, oranges, potatoes, tomatoes, and spinach.  Keep all follow-up visits as told by your  doctor. This is important. Contact a doctor if:  You have belly (abdominal) pain that: ? Gets worse. ? Stays in one area (localizes).  You have a rash.  You have a stiff neck.  You get angry or annoyed more easily than normal (irritability).  You are more sleepy than normal.  You have a harder time waking up than normal.  You feel: ? Weak. ? Dizzy. ? Very thirsty.  You have peed (urinated) only a small amount of very dark pee during 6-8 hours. Get help right away if:  You have symptoms of very bad dehydration.  You cannot drink fluids without throwing up (vomiting).  Your symptoms get worse with treatment.  You have a fever.  You have a very bad headache.  You are throwing up or having watery poop (diarrhea) and it: ? Gets worse. ? Does not go away.  You have blood or something green (bile) in your throw-up.  You have blood in your poop (stool). This may cause poop to look black and tarry.  You have not peed in 6-8 hours.  You pass out (faint).  Your heart rate when you are sitting still is more than 100 beats a minute.  You have trouble breathing. This information is not intended to replace advice given to you by your health care provider. Make sure you discuss any questions you have with your health care provider. Document Released: 02/05/2009 Document Revised: 10/30/2015 Document Reviewed: 06/05/2015 Elsevier Interactive Patient Education  2018 [employer].

## 2016-11-11 LAB — MULTIPLE MYELOMA PANEL, SERUM
ALBUMIN SERPL ELPH-MCNC: 2.5 g/dL — AB (ref 2.9–4.4)
ALBUMIN/GLOB SERPL: 1 (ref 0.7–1.7)
Alpha 1: 0.2 g/dL (ref 0.0–0.4)
Alpha2 Glob SerPl Elph-Mcnc: 1.1 g/dL — ABNORMAL HIGH (ref 0.4–1.0)
B-GLOBULIN SERPL ELPH-MCNC: 0.9 g/dL (ref 0.7–1.3)
GAMMA GLOB SERPL ELPH-MCNC: 0.3 g/dL — AB (ref 0.4–1.8)
GLOBULIN, TOTAL: 2.6 g/dL (ref 2.2–3.9)
IgA, Qn, Serum: 62 mg/dL (ref 61–437)
IgG, Qn, Serum: 269 mg/dL — ABNORMAL LOW (ref 700–1600)
IgM, Qn, Serum: 33 mg/dL (ref 15–143)
Total Protein: 5.1 g/dL — ABNORMAL LOW (ref 6.0–8.5)

## 2016-11-14 ENCOUNTER — Encounter: Payer: Self-pay | Admitting: Hematology

## 2016-11-14 ENCOUNTER — Other Ambulatory Visit (HOSPITAL_BASED_OUTPATIENT_CLINIC_OR_DEPARTMENT_OTHER): Payer: Medicare HMO

## 2016-11-14 ENCOUNTER — Ambulatory Visit: Payer: Medicare HMO

## 2016-11-14 ENCOUNTER — Ambulatory Visit (HOSPITAL_BASED_OUTPATIENT_CLINIC_OR_DEPARTMENT_OTHER): Payer: Medicare HMO | Admitting: Hematology

## 2016-11-14 VITALS — BP 116/82 | HR 80 | Temp 97.8°F | Resp 18 | Ht 71.0 in | Wt 199.9 lb

## 2016-11-14 DIAGNOSIS — N08 Glomerular disorders in diseases classified elsewhere: Secondary | ICD-10-CM

## 2016-11-14 DIAGNOSIS — E854 Organ-limited amyloidosis: Secondary | ICD-10-CM | POA: Diagnosis not present

## 2016-11-14 DIAGNOSIS — E8581 Light chain (AL) amyloidosis: Secondary | ICD-10-CM

## 2016-11-14 DIAGNOSIS — E860 Dehydration: Secondary | ICD-10-CM

## 2016-11-14 DIAGNOSIS — D696 Thrombocytopenia, unspecified: Secondary | ICD-10-CM

## 2016-11-14 DIAGNOSIS — R69 Illness, unspecified: Secondary | ICD-10-CM | POA: Diagnosis not present

## 2016-11-14 DIAGNOSIS — R5383 Other fatigue: Secondary | ICD-10-CM

## 2016-11-14 LAB — CBC & DIFF AND RETIC
BASO%: 0.3 % (ref 0.0–2.0)
Basophils Absolute: 0 10*3/uL (ref 0.0–0.1)
EOS ABS: 0 10*3/uL (ref 0.0–0.5)
EOS%: 0.1 % (ref 0.0–7.0)
HCT: 32.9 % — ABNORMAL LOW (ref 38.4–49.9)
HEMOGLOBIN: 11.4 g/dL — AB (ref 13.0–17.1)
Immature Retic Fract: 14.7 % — ABNORMAL HIGH (ref 3.00–10.60)
LYMPH#: 0.2 10*3/uL — AB (ref 0.9–3.3)
LYMPH%: 3.2 % — ABNORMAL LOW (ref 14.0–49.0)
MCH: 30.8 pg (ref 27.2–33.4)
MCHC: 34.7 g/dL (ref 32.0–36.0)
MCV: 88.9 fL (ref 79.3–98.0)
MONO#: 0.2 10*3/uL (ref 0.1–0.9)
MONO%: 2.6 % (ref 0.0–14.0)
NEUT#: 6.5 10*3/uL (ref 1.5–6.5)
NEUT%: 93.8 % — AB (ref 39.0–75.0)
RBC: 3.7 10*6/uL — ABNORMAL LOW (ref 4.20–5.82)
RDW: 15.4 % — ABNORMAL HIGH (ref 11.0–14.6)
RETIC %: 0.97 % (ref 0.80–1.80)
RETIC CT ABS: 35.89 10*3/uL (ref 34.80–93.90)
WBC: 6.9 10*3/uL (ref 4.0–10.3)
nRBC: 5 % — ABNORMAL HIGH (ref 0–0)

## 2016-11-14 LAB — COMPREHENSIVE METABOLIC PANEL
ALBUMIN: 2.7 g/dL — AB (ref 3.5–5.0)
ALK PHOS: 75 U/L (ref 40–150)
ALT: 24 U/L (ref 0–55)
ANION GAP: 9 meq/L (ref 3–11)
AST: 26 U/L (ref 5–34)
BILIRUBIN TOTAL: 0.57 mg/dL (ref 0.20–1.20)
BUN: 28.3 mg/dL — ABNORMAL HIGH (ref 7.0–26.0)
CO2: 22 mEq/L (ref 22–29)
Calcium: 9.1 mg/dL (ref 8.4–10.4)
Chloride: 108 mEq/L (ref 98–109)
Creatinine: 1 mg/dL (ref 0.7–1.3)
EGFR: 73 mL/min/{1.73_m2} — AB (ref >90)
GLUCOSE: 105 mg/dL (ref 70–140)
POTASSIUM: 4.5 meq/L (ref 3.5–5.1)
SODIUM: 139 meq/L (ref 136–145)
TOTAL PROTEIN: 5.6 g/dL — AB (ref 6.4–8.3)

## 2016-11-14 LAB — TECHNOLOGIST REVIEW

## 2016-11-14 NOTE — Patient Instructions (Signed)
Thank you for choosing [facility] to provide your oncology and hematology care.  To afford each patient quality time with our providers, please arrive 30 minutes before your scheduled appointment time.  If you arrive late for your appointment, you may be asked to reschedule.  We strive to give you quality time with our providers, and arriving late affects you and other patients whose appointments are after yours.  If you are a no show for multiple scheduled visits, you may be dismissed from the clinic at the providers discretion.   Again, thank you for choosing [facility], our hope is that these requests will decrease the amount of time that you wait before being seen by our physicians.  ______________________________________________________________________ Should you have questions after your visit to the [facility], please contact our office at [phone] between the hours of 8:30 and 4:30 p.m.    Voicemails left after 4:30p.m will not be returned until the following business day.   For prescription refill requests, please have your pharmacy contact us directly.  Please also try to allow 48 hours for prescription requests.   Please contact the scheduling department for questions regarding scheduling.  For scheduling of procedures such as PET scans, CT scans, MRI, Ultrasound, etc please contact central scheduling at [phone].   Resources For Cancer Patients and Caregivers:  American Cancer Society:  800-227-2345  Can help patients locate various types of support and financial assistance Cancer Care: 1-800-813-HOPE (4673) Provides financial assistance, online support groups, medication/co-pay assistance.   [place] DSS:  336-641-3447 Where to apply for food stamps, Medicaid, and utility assistance Medicare Rights Center: 800-333-4114 Helps people with Medicare understand their rights and benefits, navigate the Medicare system, and secure the  quality healthcare they deserve SCAT: 336-333-6589 [employer]'s shared-ride transportation service for eligible riders who have a disability that prevents them from riding the fixed route bus.   For additional information on assistance programs please contact our social worker:   Grier Hock/Abigail Elmore:  [phone] 

## 2016-11-15 ENCOUNTER — Telehealth: Payer: Self-pay | Admitting: Hematology

## 2016-11-15 NOTE — Telephone Encounter (Signed)
Added additional appointments for August per 7/23 los. Spoke with patient and he will get updated schedule at 7/30 visit.

## 2016-11-16 ENCOUNTER — Other Ambulatory Visit: Payer: Self-pay

## 2016-11-16 ENCOUNTER — Telehealth: Payer: Self-pay

## 2016-11-16 ENCOUNTER — Emergency Department (HOSPITAL_COMMUNITY): Payer: Medicare HMO

## 2016-11-16 ENCOUNTER — Ambulatory Visit (HOSPITAL_BASED_OUTPATIENT_CLINIC_OR_DEPARTMENT_OTHER): Payer: Medicare HMO

## 2016-11-16 ENCOUNTER — Encounter (HOSPITAL_COMMUNITY): Payer: Self-pay | Admitting: Nurse Practitioner

## 2016-11-16 ENCOUNTER — Other Ambulatory Visit (HOSPITAL_BASED_OUTPATIENT_CLINIC_OR_DEPARTMENT_OTHER): Payer: Medicare HMO

## 2016-11-16 ENCOUNTER — Emergency Department (HOSPITAL_COMMUNITY)
Admission: EM | Admit: 2016-11-16 | Discharge: 2016-11-16 | Disposition: A | Discharge status: Home / Self Care | Payer: Medicare HMO | Attending: Emergency Medicine | Admitting: Emergency Medicine

## 2016-11-16 ENCOUNTER — Encounter: Payer: Medicare HMO | Admitting: Internal Medicine

## 2016-11-16 DIAGNOSIS — I959 Hypotension, unspecified: Secondary | ICD-10-CM | POA: Diagnosis not present

## 2016-11-16 DIAGNOSIS — E8581 Light chain (AL) amyloidosis: Secondary | ICD-10-CM

## 2016-11-16 DIAGNOSIS — Z7982 Long term (current) use of aspirin: Secondary | ICD-10-CM | POA: Diagnosis not present

## 2016-11-16 DIAGNOSIS — Z79899 Other long term (current) drug therapy: Secondary | ICD-10-CM | POA: Diagnosis not present

## 2016-11-16 DIAGNOSIS — I2510 Atherosclerotic heart disease of native coronary artery without angina pectoris: Secondary | ICD-10-CM | POA: Diagnosis not present

## 2016-11-16 DIAGNOSIS — I10 Essential (primary) hypertension: Secondary | ICD-10-CM | POA: Insufficient documentation

## 2016-11-16 DIAGNOSIS — Z87891 Personal history of nicotine dependence: Secondary | ICD-10-CM | POA: Diagnosis not present

## 2016-11-16 DIAGNOSIS — R55 Syncope and collapse: Secondary | ICD-10-CM | POA: Diagnosis not present

## 2016-11-16 DIAGNOSIS — E860 Dehydration: Secondary | ICD-10-CM

## 2016-11-16 DIAGNOSIS — R0602 Shortness of breath: Secondary | ICD-10-CM | POA: Diagnosis not present

## 2016-11-16 LAB — UPEP/UIFE/LIGHT CHAINS/TP, 24-HR UR
% BETA, URINE: 11.7 %
ALBUMIN, U: 75.7 %
ALPHA 1 URINE: 5.8 %
ALPHA-2-GLOBULIN, U: 5 %
FREE KAPPA LT CHAINS, UR: 24.8 mg/L — AB (ref 1.35–24.19)
Free Lambda Lt Chains,Ur: 2.91 mg/L (ref 0.24–6.66)
GAMMA GLOBULIN URINE: 1.8 %
KAPPA/LAMBDA RATIO, U: 8.52 (ref 2.04–10.37)
PROTEIN UR: 178.7 mg/dL
Prot,24hr calculated: 5093 mg/24 hr — ABNORMAL HIGH (ref 30–150)

## 2016-11-16 LAB — CBC WITH DIFFERENTIAL/PLATELET
BASO%: 0.2 % (ref 0.0–2.0)
BASOS ABS: 0 10*3/uL (ref 0.0–0.1)
BASOS PCT: 0 %
Basophils Absolute: 0 10*3/uL (ref 0.0–0.1)
EOS ABS: 0 10*3/uL (ref 0.0–0.5)
EOS ABS: 0.1 10*3/uL (ref 0.0–0.7)
EOS PCT: 1 %
EOS%: 0.3 % (ref 0.0–7.0)
HCT: 33.9 % — ABNORMAL LOW (ref 38.4–49.9)
HCT: 29.5 % — ABNORMAL LOW (ref 39.0–52.0)
HGB: 11.8 g/dL — ABNORMAL LOW (ref 13.0–17.1)
Hemoglobin: 10.5 g/dL — ABNORMAL LOW (ref 13.0–17.0)
LYMPH%: 19.9 % (ref 14.0–49.0)
LYMPHS ABS: 1.1 10*3/uL (ref 0.7–4.0)
Lymphocytes Relative: 18 %
MCH: 31.2 pg (ref 27.2–33.4)
MCH: 31.5 pg (ref 26.0–34.0)
MCHC: 34.8 g/dL (ref 32.0–36.0)
MCHC: 35.6 g/dL (ref 30.0–36.0)
MCV: 89.7 fL (ref 79.3–98.0)
MCV: 88.6 fL (ref 78.0–100.0)
MONO ABS: 0.4 10*3/uL (ref 0.1–1.0)
MONO#: 0.3 10*3/uL (ref 0.1–0.9)
MONO%: 4.4 % (ref 0.0–14.0)
Monocytes Relative: 6 %
NEUT#: 4.3 10*3/uL (ref 1.5–6.5)
NEUT%: 75.2 % — ABNORMAL HIGH (ref 39.0–75.0)
NEUTROS ABS: 4.5 10*3/uL (ref 1.7–7.7)
Neutrophils Relative %: 75 %
PLATELETS: 108 10*3/uL — AB (ref 140–400)
PLATELETS: 110 10*3/uL — AB (ref 150–400)
RBC: 3.78 10*6/uL — AB (ref 4.20–5.82)
RBC: 3.33 MIL/uL — ABNORMAL LOW (ref 4.22–5.81)
RDW: 16 % — ABNORMAL HIGH (ref 11.0–14.6)
RDW: 15.8 % — AB (ref 11.5–15.5)
WBC: 5.7 10*3/uL (ref 4.0–10.3)
WBC: 6.1 10*3/uL (ref 4.0–10.5)
lymph#: 1.1 10*3/uL (ref 0.9–3.3)

## 2016-11-16 LAB — URINALYSIS, ROUTINE W REFLEX MICROSCOPIC
BACTERIA UA: NONE SEEN
Bilirubin Urine: NEGATIVE
Glucose, UA: 50 mg/dL — AB
KETONES UR: NEGATIVE mg/dL
LEUKOCYTES UA: NEGATIVE
Nitrite: NEGATIVE
PH: 5 (ref 5.0–8.0)
PROTEIN: 100 mg/dL — AB
SQUAMOUS EPITHELIAL / LPF: NONE SEEN
Specific Gravity, Urine: 1.009 (ref 1.005–1.030)

## 2016-11-16 LAB — COMPREHENSIVE METABOLIC PANEL
ALBUMIN: 2.5 g/dL — AB (ref 3.5–5.0)
ALK PHOS: 74 U/L (ref 40–150)
ALK PHOS: 59 U/L (ref 38–126)
ALT: 22 U/L (ref 0–55)
ALT: 24 U/L (ref 17–63)
ANION GAP: 8 meq/L (ref 3–11)
AST: 22 U/L (ref 5–34)
AST: 27 U/L (ref 15–41)
Albumin: 2.5 g/dL — ABNORMAL LOW (ref 3.5–5.0)
Anion gap: 7 (ref 5–15)
BUN: 37.3 mg/dL — ABNORMAL HIGH (ref 7.0–26.0)
BUN: 37 mg/dL — AB (ref 6–20)
CHLORIDE: 105 mmol/L (ref 101–111)
CO2: 24 mEq/L (ref 22–29)
CO2: 23 mmol/L (ref 22–32)
CREATININE: 1.13 mg/dL (ref 0.61–1.24)
Calcium: 8.8 mg/dL (ref 8.4–10.4)
Calcium: 8 mg/dL — ABNORMAL LOW (ref 8.9–10.3)
Chloride: 106 mEq/L (ref 98–109)
Creatinine: 1.1 mg/dL (ref 0.7–1.3)
EGFR: 65 mL/min/{1.73_m2} — AB (ref >90)
GFR calc non Af Amer: >60 mL/min (ref >60)
GLUCOSE: 113 mg/dL (ref 70–140)
GLUCOSE: 100 mg/dL — AB (ref 65–99)
POTASSIUM: 4.3 meq/L (ref 3.5–5.1)
Potassium: 4.5 mmol/L (ref 3.5–5.1)
SODIUM: 138 meq/L (ref 136–145)
Sodium: 135 mmol/L (ref 135–145)
Total Bilirubin: 0.73 mg/dL (ref 0.20–1.20)
Total Bilirubin: 0.5 mg/dL (ref 0.3–1.2)
Total Protein: 5.2 g/dL — ABNORMAL LOW (ref 6.4–8.3)
Total Protein: 4.9 g/dL — ABNORMAL LOW (ref 6.5–8.1)

## 2016-11-16 LAB — I-STAT CG4 LACTIC ACID, ED: LACTIC ACID, VENOUS: 1.21 mmol/L (ref 0.5–1.9)

## 2016-11-16 LAB — I-STAT TROPONIN, ED: Troponin i, poc: 0.02 ng/mL (ref 0.00–0.08)

## 2016-11-16 LAB — CORTISOL: CORTISOL PLASMA: 7.6 ug/dL

## 2016-11-16 LAB — BRAIN NATRIURETIC PEPTIDE: B NATRIURETIC PEPTIDE 5: 45.8 pg/mL (ref 0.0–100.0)

## 2016-11-16 MED ORDER — DEXAMETHASONE SODIUM PHOSPHATE 10 MG/ML IJ SOLN
10.0000 mg | Freq: Once | INTRAMUSCULAR | Status: AC
  Administered 2016-11-16: 10 mg via INTRAVENOUS
  Filled 2016-11-16: qty 1

## 2016-11-16 MED ORDER — IOPAMIDOL (ISOVUE-370) INJECTION 76%
INTRAVENOUS | Status: AC
  Administered 2016-11-16: 80 mL via INTRAVENOUS
  Filled 2016-11-16: qty 100

## 2016-11-16 MED ORDER — SODIUM CHLORIDE 0.9 % IV BOLUS (SEPSIS)
1000.0000 mL | Freq: Once | INTRAVENOUS | Status: AC
  Administered 2016-11-16: 1000 mL via INTRAVENOUS

## 2016-11-16 MED ORDER — HYDROCODONE-ACETAMINOPHEN 5-325 MG PO TABS: 1.0000 | ORAL_TABLET | Freq: Once | ORAL | Status: DC

## 2016-11-16 MED ORDER — SODIUM CHLORIDE 0.9 % IV SOLN
Freq: Once | INTRAVENOUS | Status: AC
  Administered 2016-11-16: 12:00:00 via INTRAVENOUS

## 2016-11-16 NOTE — Telephone Encounter (Signed)
Pt's wife called this morning to relay low BP. First thing this morning BP 104/68. After breakfast, drinking fluids, and taking a shower BP 95/66. Told pt I would call back with information about plan for today. Called back and pt answered. Verified that he has been feeling dizzy and lightheaded. Orders added for labs and fluids. Appts confirmed. Pt verbalized that he would get here as soon as possible.

## 2016-11-16 NOTE — ED Provider Notes (Signed)
Assumed care from Dr. Rex Kras. Patient has a history of amyloidosis and nephrotic syndrome currently on chemotherapy for the amyloidosis who presents to the ED from clinic for hypotension with systolic BP is in the 90B. No infectious symptoms. Labs grossly reassuring. Concern for possible PE given the nephrotic syndrome. Plan is to IV hydrate the patient in follow-up on CTA.  CTA negative. Steroid tx was started 3 months ago. Doubt adrenal insufficiency.  After 2 L of IV fluid bolus patient was completely asymptomatic and had improved blood pressures with systolics into the 311E. Orthostatics obtained and were reassuring.  The patient is safe for discharge with strict return precautions.  Disposition: Discharge  Condition: Good  I have discussed the results, Dx and Tx plan with the patient who expressed understanding and agree(s) with the plan. Discharge instructions discussed at great length. The patient was given strict return precautions who verbalized understanding of the instructions. No further questions at time of discharge.    New Prescriptions   No medications on file    Follow Up: [name], MD [place] [address] [phone]  Schedule an appointment as soon as possible for a visit         Leonette Monarch Grayce Sessions, MD 11/16/16 2040

## 2016-11-16 NOTE — Progress Notes (Signed)
Pt arrived to [facility] via w/c with very low BP 68/49. No tachycarida. C/o some SOB. Denies chest pain.  Repeat BP 72/52. Unable to do orthostatic check as pt could not stand long enough to do that. He stated he felt like he would pass out if he stood too long.  IV started right forearm with Saline wide open.  Dr. [name] made aware. Decision made to send pt to ED for evaluation of hypotension. Pt with Amyloidosis with nephrotic syndrome. CBC and CMEt has been done already. Dr. [name] requesting EKG, Cardiac Echo and V/Q scan to r/o PE. Pt should not receive any IV contrast d/t nephrotic syndrome and the need to protect his kidneys.  Call made to charge nurse, Lilia Pro.  Room 7 available now.  IV saline locked for transport.  Transported pt via w/c with wife in attendance. Report given to [place], [place]

## 2016-11-16 NOTE — ED Notes (Signed)
Bed: WA07 Expected date:  Expected time:  Means of arrival:  Comments: [facility] hypotension

## 2016-11-16 NOTE — ED Provider Notes (Signed)
[facility] DEPT Provider Note   CSN: [id number] Arrival date & time: 11/16/16  1228     History   Chief Complaint Chief Complaint  Patient presents with  . Hypotension    HPI Alex [name]. is a 76 y.o. male.  Pt with Amyloidosis diagnosed April 29th 2018, CAD, Aortic stenosis, presents with hypotension with associated dizziness that began this morning while sitting down watching tv, pt was seated for 30 minutes after eating breakfast. He reports that he had non bloody light brown diarrhea yesterday evening 3x episodes and this morning for 2x episodes and one episode here.  Pt states this has been going on for at least 3 weeks, more like 6 per his wife.  He feels that the episodes have been occurring more frequently and explains the last one was 3 days ago when he went to stand up he felt dizzy and had to sit down.  His PCP has discontinued his lasix and IMDUR.  These episodes are usually associated with a headache that follows and goes away shortly after.  Pt also complains of increasing shortness of breath the last couple of weeks.  He also endorses a slight "twinge" of pain in his chest with exertion that goes away a few seconds after it happens.   Pt has had decreased appetite since starting on his chemotherapy for amyloidosis due to lack of ability to taste.  Pt denies any fever, chills, recent abx use, nausea, vomiting.        Past Medical History:  Diagnosis Date  . Amyloidosis ([place])   . Arthritis   . CAD (coronary artery disease)    Lexiscan Myoview 6/14: Normal study, no scar or ischemia, EF 62%  . Colon polyp   . Coronary artery disease    moderate disease by cath 2011  . Diverticulosis of colon   . GERD (gastroesophageal reflux disease)   . Hearing loss   . Hiatal hernia   . Hyperlipidemia   . Hypertension   . MRSA (methicillin resistant staph aureus) culture positive   . Seizure disorder ([place])   . Seizures ([place])    last in 1970s    Patient Active  Problem List   Diagnosis Date Noted  . Folliculitis 29/92/4268  . AL amyloidosis ([place]) 08/21/2016  . Umbilical hernia 34/19/6222  . DIZZINESS 02/19/2010  . CAD, NATIVE VESSEL 07/21/2009  . CHEST PAIN-UNSPECIFIED 07/21/2009  . HYPERLIPIDEMIA 04/30/2007  . HYPERTENSION 04/30/2007  . GASTROESOPHAGEAL REFLUX DISEASE 04/30/2007  . SEIZURE DISORDER 04/30/2007  . COMPRESSION FRACTURE, L1 VERTEBRA 04/30/2007  . DIVERTICULOSIS, COLON, HX OF 04/30/2007  . Other postprocedural status(V45.89) 04/30/2007    Past Surgical History:  Procedure Laterality Date  . BUNIONECTOMY    . CARPAL TUNNEL RELEASE  1994   left  . CERVICAL FUSION    . compression fracture    . FOOT SURGERY     right -twice, left foot once  . HERNIA REPAIR     Umbilical with PVP  . KNEE ARTHROSCOPY     left knee  . [place]   left - scope  . NASAL SEPTUM SURGERY    . NOSE SURGERY     twice  . ROTATOR CUFF REPAIR     twice, both shoulders  . ROTATOR CUFF REPAIR    . SPINE SURGERY     C2, C3, C4  . TRIGGER FINGER RELEASE     both hands, twice  . TRIGGER FINGER RELEASE    .  VASECTOMY         Home Medications    Prior to Admission medications   Medication Sig Start Date End Date Taking? Authorizing Provider  acyclovir (ZOVIRAX) 400 MG tablet Take 1 tablet (400 mg total) by mouth 2 (two) times daily. 08/23/16  Yes Brunetta Genera, MD  aspirin EC 81 MG tablet Take 81 mg by mouth daily.   Yes [provider]  atorvastatin (LIPITOR) 40 MG tablet Take 40 mg by mouth at bedtime.    Yes [provider]  cholecalciferol (VITAMIN D) 1000 units tablet Take 1,000 Units by mouth daily.   Yes [provider]  clindamycin (CLEOCIN-T) 1 % external solution Apply topically 2 (two) times daily. Patient taking differently: Apply 1 application topically 2 (two) times daily as needed (for rash).  10/25/16  Yes Curcio, Roselie Awkward, NP  cyclophosphamide (CYTOXAN) 50 MG tablet Take 17 tablets  (850 mg total) by mouth. Take on days 1,8,15 of chemo 1hr before or 2hr after meals every 21 days. AL Amyloidosis ICD:E85.81 09/07/16  Yes Brunetta Genera, MD  dexamethasone (DECADRON) 4 MG tablet Take 10 tablets (40 mg) on days 1, 8, and 15 of chemo. Repeat every 21 days. 08/23/16  Yes Brunetta Genera, MD  ezetimibe (ZETIA) 10 MG tablet Take 10 mg by mouth daily.    Yes [provider]  HYDROcodone-acetaminophen (NORCO/VICODIN) 5-325 MG tablet Take 1-2 tablets by mouth every 4 (four) hours as needed. Patient taking differently: Take 1-2 tablets by mouth every 4 (four) hours as needed.  10/19/16  Yes Carlisle Cater, [name]  isosorbide mononitrate (IMDUR) 30 MG 24 hr tablet Take 0.5 tablets (15 mg total) by mouth daily. 10/01/15  Yes Burnell Blanks, MD  mupirocin cream (BACTROBAN) 2 % Apply 1 application topically 2 (two) times daily. Patient taking differently: Apply 1 application topically 2 (two) times daily as needed (for rash/irritation).  10/25/16  Yes Curcio, Roselie Awkward, NP  nitroGLYCERIN (NITROSTAT) 0.4 MG SL tablet Place 1 tablet (0.4 mg total) under the tongue every 5 (five) minutes as needed for chest pain. 07/10/15  Yes Burtis Junes, NP  omeprazole (PRILOSEC) 20 MG capsule Take 20 mg by mouth daily.     Yes [provider]  ondansetron (ZOFRAN) 8 MG tablet Take 1 tablet (8 mg total) by mouth 2 (two) times daily as needed for refractory nausea / vomiting. Start on D 4 and 11. Do not take on D8. 08/23/16  Yes Kale, Cloria [place], MD  potassium chloride SA (K-DUR,KLOR-CON) 20 MEQ tablet Take 20 mEq by mouth at bedtime.    Yes [provider]  prochlorperazine (COMPAZINE) 10 MG tablet Take 1 tablet (10 mg total) by mouth every 6 (six) hours as needed (Nausea or vomiting). 10/25/16  Yes Brunetta Genera, MD  triamcinolone cream (KENALOG) 0.1 % Apply 1 application topically 2 (two) times daily as needed (for skin irritation).    Yes [provider]     Family History Family History  Problem Relation Age of Onset  . Diabetes Father   . Heart disease Father   . Heart attack Father 30  . Hypertension Father   . Hyperlipidemia Unknown   . Diabetes Unknown   . Hypertension Unknown   . Seizures Unknown   . Thyroid disease Unknown   . Colon cancer Neg Hx   . Stroke Neg Hx     Social History Social History  Substance Use Topics  . Smoking status: Former  Smoker    Quit date: 04/25/1969  . Smokeless tobacco: Former Systems developer  . Alcohol use 4.2 - 8.4 oz/week    7 - 14 Glasses of wine per week     Comment: socially shots of wiskey     Allergies   Contrast media [iodinated diagnostic agents] and Ramipril   Review of Systems Review of Systems  Constitutional: Positive for appetite change. Negative for chills and fever.  HENT: Negative for ear pain and sore throat.   Eyes: Negative for pain and visual disturbance.  Respiratory: Positive for shortness of breath. Negative for cough.   Cardiovascular: Positive for chest pain. Negative for palpitations.  Gastrointestinal: Positive for diarrhea. Negative for abdominal pain and vomiting.  Genitourinary: Negative for dysuria and hematuria.  Musculoskeletal: Negative for arthralgias and back pain.  Skin: Negative for color change and rash.  Neurological: Negative for seizures, syncope and headaches.  Psychiatric/Behavioral: Negative for agitation, behavioral problems and confusion.  All other systems reviewed and are negative.    Physical Exam Updated Vital Signs BP 97/63 (BP Location: Left Arm)   Pulse 71   Temp 97.8 F (36.6 C)   Resp 12   Ht 5\' 11"  (1.803 m)   Wt 90.3 kg (199 lb)   SpO2 99%   BMI 27.75 kg/m   Physical Exam  Constitutional: He is oriented to person, place, and time. He appears well-developed and well-nourished.  HENT:  Head: Normocephalic and atraumatic.  Eyes: Conjunctivae are normal.  Neck: Neck supple.  Cardiovascular: Normal rate and regular  rhythm.  Exam reveals no friction rub.   Murmur (2/6 systolic murmur) heard. Pulmonary/Chest: Effort normal and breath sounds normal. No respiratory distress. He has no wheezes.  Abdominal: Soft. There is no tenderness.  Musculoskeletal: He exhibits no edema.  Neurological: He is alert and oriented to person, place, and time.  Skin: Skin is warm and dry.  Psychiatric: He has a normal mood and affect.  Nursing note and vitals reviewed.    ED Treatments / Results  Labs (all labs ordered are listed, but only abnormal results are displayed) Labs Reviewed  COMPREHENSIVE METABOLIC PANEL - Abnormal; Notable for the following:       Result Value   Glucose, Bld 100 (*)    BUN 37 (*)    Calcium 8.0 (*)    Total Protein 4.9 (*)    Albumin 2.5 (*)    All other components within normal limits  CBC WITH DIFFERENTIAL/PLATELET - Abnormal; Notable for the following:    RBC 3.33 (*)    Hemoglobin 10.5 (*)    HCT 29.5 (*)    RDW 15.8 (*)    Platelets 110 (*)    All other components within normal limits  URINE CULTURE  BRAIN NATRIURETIC PEPTIDE  URINALYSIS, ROUTINE W REFLEX MICROSCOPIC  I-STAT CG4 LACTIC ACID, ED  I-STAT TROPONIN, ED    EKG  EKG Interpretation None       Radiology Dg Chest Port 1 View  Result Date: 11/16/2016 CLINICAL DATA:  Hypertension, shortness of breath EXAM: PORTABLE CHEST 1 VIEW COMPARISON:  10/18/2016 FINDINGS: There is no focal parenchymal opacity. There is no pleural effusion or pneumothorax. The heart and mediastinal contours are unremarkable. The osseous structures are unremarkable. IMPRESSION: No active disease. Electronically Signed   By: Kathreen Devoid   On: 11/16/2016 13:36    Procedures Procedures (including critical care time)  Medications Ordered in ED Medications  dexamethasone (DECADRON) injection 10 mg (not administered)  sodium chloride  0.9 % bolus 1,000 mL (1,000 mLs Intravenous New Bag/Given 11/16/16 1549)     Initial Impression /  Assessment and Plan / ED Course  I have reviewed the triage vital signs and the nursing notes.  Pertinent labs & imaging results that were available during my care of the patient were reviewed by me and considered in my medical decision making (see chart for details).  Hypotension/dizziness/presyncope DDX: Chemotherapy reaction,autonomic dysfunction 2/2 amyloidosis,Cadiomyopathy/Aortic stenosis progression 2/2 amyloidosis, adrenal insufficiency, rule out ACS, PE -CBC, CMP -Urinalysis, culture, blood cultures -Chest x-ray-negative -Bedside ultrasound negative for large pericardial fluid, ultrasound shows minimal fluid and no collapsing of RV -Dexamethasone 10mg  -CT angio chest -Disposition pending CT study    Final Clinical Impressions(s) / ED Diagnoses   Final diagnoses:  None    New Prescriptions New Prescriptions   No medications on file     Katherine Roan, MD 11/16/16 1616    Little, Wenda Overland, MD 11/17/16 [place], Wenda Overland, MD 11/17/16 1526

## 2016-11-16 NOTE — ED Notes (Addendum)
Provided patient a ham sandwich and coke with ice to drink.

## 2016-11-16 NOTE — ED Triage Notes (Signed)
Patient coming from cancer center with a blood pressure of 68/49. Patient is short of breath and his nephrotic syndrome makes him more suseptable of blood clots. No N/V. Patient having diarrhea. Patient is very lightheaded.

## 2016-11-17 ENCOUNTER — Telehealth: Payer: Self-pay

## 2016-11-17 NOTE — Telephone Encounter (Signed)
Request for recent 24 hour urine results to be faxed. Fax number provided.

## 2016-11-18 ENCOUNTER — Telehealth: Payer: Self-pay

## 2016-11-18 DIAGNOSIS — R569 Unspecified convulsions: Secondary | ICD-10-CM | POA: Diagnosis not present

## 2016-11-18 DIAGNOSIS — R110 Nausea: Secondary | ICD-10-CM | POA: Diagnosis not present

## 2016-11-18 DIAGNOSIS — R42 Dizziness and giddiness: Secondary | ICD-10-CM | POA: Diagnosis not present

## 2016-11-18 DIAGNOSIS — I9589 Other hypotension: Secondary | ICD-10-CM | POA: Diagnosis not present

## 2016-11-18 DIAGNOSIS — Z6829 Body mass index (BMI) 29.0-29.9, adult: Secondary | ICD-10-CM | POA: Diagnosis not present

## 2016-11-18 DIAGNOSIS — K5909 Other constipation: Secondary | ICD-10-CM | POA: Diagnosis not present

## 2016-11-18 DIAGNOSIS — R630 Anorexia: Secondary | ICD-10-CM | POA: Diagnosis not present

## 2016-11-18 LAB — URINE CULTURE: SPECIAL REQUESTS: NORMAL

## 2016-11-18 NOTE — Telephone Encounter (Signed)
Wife of pt asking if it's okay for pt to use protein powder. Dr. [name] okay with this.

## 2016-11-21 ENCOUNTER — Other Ambulatory Visit (HOSPITAL_BASED_OUTPATIENT_CLINIC_OR_DEPARTMENT_OTHER): Payer: Medicare HMO

## 2016-11-21 ENCOUNTER — Ambulatory Visit (HOSPITAL_BASED_OUTPATIENT_CLINIC_OR_DEPARTMENT_OTHER): Payer: Medicare HMO

## 2016-11-21 VITALS — BP 92/56 | HR 76 | Temp 98.2°F | Resp 18

## 2016-11-21 DIAGNOSIS — E8581 Light chain (AL) amyloidosis: Secondary | ICD-10-CM | POA: Diagnosis not present

## 2016-11-21 DIAGNOSIS — N08 Glomerular disorders in diseases classified elsewhere: Secondary | ICD-10-CM

## 2016-11-21 DIAGNOSIS — E854 Organ-limited amyloidosis: Secondary | ICD-10-CM

## 2016-11-21 DIAGNOSIS — Z5112 Encounter for antineoplastic immunotherapy: Secondary | ICD-10-CM | POA: Diagnosis not present

## 2016-11-21 LAB — CBC & DIFF AND RETIC
BASO%: 0.3 % (ref 0.0–2.0)
BASOS ABS: 0 10*3/uL (ref 0.0–0.1)
EOS%: 1.8 % (ref 0.0–7.0)
Eosinophils Absolute: 0.1 10*3/uL (ref 0.0–0.5)
HEMATOCRIT: 34.9 % — AB (ref 38.4–49.9)
HGB: 11.9 g/dL — ABNORMAL LOW (ref 13.0–17.1)
Immature Retic Fract: 10.4 % (ref 3.00–10.60)
LYMPH%: 11.3 % — AB (ref 14.0–49.0)
MCH: 31.8 pg (ref 27.2–33.4)
MCHC: 34.1 g/dL (ref 32.0–36.0)
MCV: 93.3 fL (ref 79.3–98.0)
MONO#: 0.3 10*3/uL (ref 0.1–0.9)
MONO%: 3.5 % (ref 0.0–14.0)
NEUT#: 5.9 10*3/uL (ref 1.5–6.5)
NEUT%: 83.1 % — AB (ref 39.0–75.0)
Platelets: 248 10*3/uL (ref 140–400)
RBC: 3.74 10*6/uL — ABNORMAL LOW (ref 4.20–5.82)
RDW: 16.7 % — ABNORMAL HIGH (ref 11.0–14.6)
RETIC CT ABS: 133.14 10*3/uL — AB (ref 34.80–93.90)
Retic %: 3.56 % — ABNORMAL HIGH (ref 0.80–1.80)
WBC: 7.1 10*3/uL (ref 4.0–10.3)
lymph#: 0.8 10*3/uL — ABNORMAL LOW (ref 0.9–3.3)

## 2016-11-21 LAB — COMPREHENSIVE METABOLIC PANEL
ALT: 19 U/L (ref 0–55)
ANION GAP: 8 meq/L (ref 3–11)
AST: 22 U/L (ref 5–34)
Albumin: 2.6 g/dL — ABNORMAL LOW (ref 3.5–5.0)
Alkaline Phosphatase: 63 U/L (ref 40–150)
BUN: 27.3 mg/dL — ABNORMAL HIGH (ref 7.0–26.0)
CALCIUM: 9 mg/dL (ref 8.4–10.4)
CHLORIDE: 109 meq/L (ref 98–109)
CO2: 25 mEq/L (ref 22–29)
CREATININE: 1 mg/dL (ref 0.7–1.3)
EGFR: 75 mL/min/{1.73_m2} — AB (ref >90)
Glucose: 108 mg/dl (ref 70–140)
Potassium: 4 mEq/L (ref 3.5–5.1)
Sodium: 142 mEq/L (ref 136–145)
Total Bilirubin: 0.66 mg/dL (ref 0.20–1.20)
Total Protein: 5.2 g/dL — ABNORMAL LOW (ref 6.4–8.3)

## 2016-11-21 MED ORDER — BORTEZOMIB CHEMO SQ INJECTION 3.5 MG (2.5MG/ML)
1.3000 mg/m2 | Freq: Once | INTRAMUSCULAR | Status: AC
  Administered 2016-11-21: 2.75 mg via SUBCUTANEOUS
  Filled 2016-11-21: qty 2.75

## 2016-11-21 MED ORDER — ONDANSETRON HCL 8 MG PO TABS: 8.0000 mg | ORAL_TABLET | Freq: Once | ORAL | Status: DC

## 2016-11-21 NOTE — Patient Instructions (Signed)
[facility] Discharge Instructions for Patients Receiving Chemotherapy  Today you received the following chemotherapy agents:  Velcade  To help prevent nausea and vomiting after your treatment, we encourage you to take your nausea medication as prescribed.   If you develop nausea and vomiting that is not controlled by your nausea medication, call the clinic.   BELOW ARE SYMPTOMS THAT SHOULD BE REPORTED IMMEDIATELY:  *FEVER GREATER THAN 100.5 F  *CHILLS WITH OR WITHOUT FEVER  NAUSEA AND VOMITING THAT IS NOT CONTROLLED WITH YOUR NAUSEA MEDICATION  *UNUSUAL SHORTNESS OF BREATH  *UNUSUAL BRUISING OR BLEEDING  TENDERNESS IN MOUTH AND THROAT WITH OR WITHOUT PRESENCE OF ULCERS  *URINARY PROBLEMS  *BOWEL PROBLEMS  UNUSUAL RASH Items with * indicate a potential emergency and should be followed up as soon as possible.  Feel free to call the clinic you have any questions or concerns. The clinic phone number is [phone].  Please show the CHEMO ALERT CARD at check-in to the Emergency Department and triage nurse.   

## 2016-11-23 ENCOUNTER — Telehealth: Payer: Self-pay

## 2016-11-23 NOTE — Telephone Encounter (Signed)
Wife called about pt c/o dizziness last night. Seems to have resolve slightly this morning. Plan to call back if needed. Reviewed labs from 7/30 to see if electrolytes were normal and did not seen anything concerning. Pt to continue drinking fluids and eating balanced diet as able. Wife verbalized agreement to plan and understanding.

## 2016-11-24 ENCOUNTER — Ambulatory Visit (HOSPITAL_BASED_OUTPATIENT_CLINIC_OR_DEPARTMENT_OTHER): Payer: Medicare HMO

## 2016-11-24 VITALS — BP 131/74 | HR 65 | Temp 98.0°F | Resp 16

## 2016-11-24 DIAGNOSIS — E854 Organ-limited amyloidosis: Secondary | ICD-10-CM | POA: Diagnosis not present

## 2016-11-24 DIAGNOSIS — E8581 Light chain (AL) amyloidosis: Secondary | ICD-10-CM | POA: Diagnosis not present

## 2016-11-24 DIAGNOSIS — N08 Glomerular disorders in diseases classified elsewhere: Secondary | ICD-10-CM | POA: Diagnosis not present

## 2016-11-24 DIAGNOSIS — Z5112 Encounter for antineoplastic immunotherapy: Secondary | ICD-10-CM | POA: Diagnosis not present

## 2016-11-24 MED ORDER — BORTEZOMIB CHEMO SQ INJECTION 3.5 MG (2.5MG/ML)
1.3000 mg/m2 | Freq: Once | INTRAMUSCULAR | Status: AC
  Administered 2016-11-24: 2.75 mg via SUBCUTANEOUS
  Filled 2016-11-24: qty 2.75

## 2016-11-24 MED ORDER — SODIUM CHLORIDE 0.9 % IV SOLN
Freq: Once | INTRAVENOUS | Status: AC
  Administered 2016-11-24: 10:00:00 via INTRAVENOUS

## 2016-11-24 MED ORDER — PROCHLORPERAZINE MALEATE 10 MG PO TABS
10.0000 mg | ORAL_TABLET | Freq: Once | ORAL | Status: AC
  Administered 2016-11-24: 10 mg via ORAL

## 2016-11-24 MED ORDER — ONDANSETRON HCL 8 MG PO TABS: 8.0000 mg | ORAL_TABLET | Freq: Once | ORAL | Status: DC

## 2016-11-24 MED ORDER — PROCHLORPERAZINE MALEATE 10 MG PO TABS
ORAL_TABLET | ORAL | Status: AC
  Filled 2016-11-24: qty 1

## 2016-11-24 NOTE — Patient Instructions (Signed)
[facility] Discharge Instructions for Patients Receiving Chemotherapy  Today you received the following chemotherapy agents Velcade To help prevent nausea and vomiting after your treatment, we encourage you to take your nausea medication as prescribed.  If you develop nausea and vomiting that is not controlled by your nausea medication, call the clinic.   BELOW ARE SYMPTOMS THAT SHOULD BE REPORTED IMMEDIATELY:  *FEVER GREATER THAN 100.5 F  *CHILLS WITH OR WITHOUT FEVER  NAUSEA AND VOMITING THAT IS NOT CONTROLLED WITH YOUR NAUSEA MEDICATION  *UNUSUAL SHORTNESS OF BREATH  *UNUSUAL BRUISING OR BLEEDING  TENDERNESS IN MOUTH AND THROAT WITH OR WITHOUT PRESENCE OF ULCERS  *URINARY PROBLEMS  *BOWEL PROBLEMS  UNUSUAL RASH Items with * indicate a potential emergency and should be followed up as soon as possible.  Feel free to call the clinic you have any questions or concerns. The clinic phone number is [phone].  Please show the [facility] at check-in to the Emergency Department and triage nurse.    Dehydration, Adult Dehydration is a condition in which there is not enough fluid or water in the body. This happens when you lose more fluids than you take in. Important organs, such as the kidneys, brain, and heart, cannot function without a proper amount of fluids. Any loss of fluids from the body can lead to dehydration. Dehydration can range from mild to severe. This condition should be treated right away to prevent it from becoming severe. What are the causes? This condition may be caused by:  Vomiting.  Diarrhea.  Excessive sweating, such as from heat exposure or exercise.  Not drinking enough fluid, especially: ? When ill. ? While doing activity that requires a lot of energy.  Excessive urination.  Fever.  Infection.  Certain medicines, such as medicines that cause the body to lose excess fluid (diuretics).  Inability to access safe  drinking water.  Reduced physical ability to get adequate water and food.  What increases the risk? This condition is more likely to develop in people:  Who have a poorly controlled long-term (chronic) illness, such as diabetes, heart disease, or kidney disease.  Who are age 36 or older.  Who are disabled.  Who live in a place with high altitude.  Who play endurance sports.  What are the signs or symptoms? Symptoms of mild dehydration may include:  Thirst.  Dry lips.  Slightly dry mouth.  Dry, warm skin.  Dizziness. Symptoms of moderate dehydration may include:  Very dry mouth.  Muscle cramps.  Dark urine. Urine may be the color of tea.  Decreased urine production.  Decreased tear production.  Heartbeat that is irregular or faster than normal (palpitations).  Headache.  Light-headedness, especially when you stand up from a sitting position.  Fainting (syncope). Symptoms of severe dehydration may include:  Changes in skin, such as: ? Cold and clammy skin. ? Blotchy (mottled) or pale skin. ? Skin that does not quickly return to normal after being lightly pinched and released (poor skin turgor).  Changes in body fluids, such as: ? Extreme thirst. ? No tear production. ? Inability to sweat when body temperature is high, such as in hot weather. ? Very little urine production.  Changes in vital signs, such as: ? Weak pulse. ? Pulse that is more than 100 beats a minute when sitting still. ? Rapid breathing. ? Low blood pressure.  Other changes, such as: ? Sunken eyes. ? Cold hands and feet. ? Confusion. ? Lack of energy (  lethargy). ? Difficulty waking up from sleep. ? Short-term weight loss. ? Unconsciousness. How is this diagnosed? This condition is diagnosed based on your symptoms and a physical exam. Blood and urine tests may be done to help confirm the diagnosis. How is this treated? Treatment for this condition depends on the severity. Mild  or moderate dehydration can often be treated at home. Treatment should be started right away. Do not wait until dehydration becomes severe. Severe dehydration is an emergency and it needs to be treated in a hospital. Treatment for mild dehydration may include:  Drinking more fluids.  Replacing salts and minerals in your blood (electrolytes) that you may have lost. Treatment for moderate dehydration may include:  Drinking an oral rehydration solution (ORS). This is a drink that helps you replace fluids and electrolytes (rehydrate). It can be found at pharmacies and retail stores. Treatment for severe dehydration may include:  Receiving fluids through an IV tube.  Receiving an electrolyte solution through a feeding tube that is passed through your nose and into your stomach (nasogastric tube, or NG tube).  Correcting any abnormalities in electrolytes.  Treating the underlying cause of dehydration. Follow these instructions at home:  If directed by your health care provider, drink an ORS: ? Make an ORS by following instructions on the package. ? Start by drinking small amounts, about  cup (120 mL) every 5-10 minutes. ? Slowly increase how much you drink until you have taken the amount recommended by your health care provider.  Drink enough clear fluid to keep your urine clear or pale yellow. If you were told to drink an ORS, finish the ORS first, then start slowly drinking other clear fluids. Drink fluids such as: ? Water. Do not drink only water. Doing that can lead to having too little salt (sodium) in the body (hyponatremia). ? Ice chips. ? Fruit juice that you have added water to (diluted fruit juice). ? Low-calorie sports drinks.  Avoid: ? Alcohol. ? Drinks that contain a lot of sugar. These include high-calorie sports drinks, fruit juice that is not diluted, and soda. ? Caffeine. ? Foods that are greasy or contain a lot of fat or sugar.  Take over-the-counter and prescription  medicines only as told by your health care provider.  Do not take sodium tablets. This can lead to having too much sodium in the body (hypernatremia).  Eat foods that contain a healthy balance of electrolytes, such as bananas, oranges, potatoes, tomatoes, and spinach.  Keep all follow-up visits as told by your health care provider. This is important. Contact a health care provider if:  You have abdominal pain that: ? Gets worse. ? Stays in one area (localizes).  You have a rash.  You have a stiff neck.  You are more irritable than usual.  You are sleepier or more difficult to wake up than usual.  You feel weak or dizzy.  You feel very thirsty.  You have urinated only a small amount of very dark urine over 6-8 hours. Get help right away if:  You have symptoms of severe dehydration.  You cannot drink fluids without vomiting.  Your symptoms get worse with treatment.  You have a fever.  You have a severe headache.  You have vomiting or diarrhea that: ? Gets worse. ? Does not go away.  You have blood or green matter (bile) in your vomit.  You have blood in your stool. This may cause stool to look black and tarry.  You have not  urinated in 6-8 hours.  You faint.  Your heart rate while sitting still is over 100 beats a minute.  You have trouble breathing. This information is not intended to replace advice given to you by your health care provider. Make sure you discuss any questions you have with your health care provider. Document Released: 04/11/2005 Document Revised: 11/06/2015 Document Reviewed: 06/05/2015 Elsevier Interactive Patient Education  [employer].

## 2016-11-24 NOTE — Progress Notes (Signed)
Patient complains of nausea this morning. Patient states he took zofran at 8 am. However, he has not experienced relief from the nausea. Also, patient states he is more fatigued than usual. Patient states " I have no energy." Patient denies SOB. Dr. [name] notified.   Proceed with chemotherapy today. Administer 1 liter of Normal Saline over 1 hour per Dr. [name]. Patient and spouse verbalized understanding of above mentioned plan.    Patient states the nausea has resolved and he is feeling much better since receiving the fluids.

## 2016-11-24 NOTE — Progress Notes (Signed)
Per Dr. [name] okay to change premed from zofran to compazine. Pt stated zofran not helping.

## 2016-11-28 ENCOUNTER — Encounter: Payer: Self-pay | Admitting: Hematology

## 2016-11-28 ENCOUNTER — Other Ambulatory Visit: Payer: Medicare HMO

## 2016-11-28 ENCOUNTER — Other Ambulatory Visit (HOSPITAL_BASED_OUTPATIENT_CLINIC_OR_DEPARTMENT_OTHER): Payer: Medicare HMO

## 2016-11-28 ENCOUNTER — Ambulatory Visit: Payer: Medicare HMO

## 2016-11-28 ENCOUNTER — Ambulatory Visit (HOSPITAL_BASED_OUTPATIENT_CLINIC_OR_DEPARTMENT_OTHER): Payer: Medicare HMO

## 2016-11-28 ENCOUNTER — Ambulatory Visit: Payer: Medicare HMO | Admitting: Nutrition

## 2016-11-28 ENCOUNTER — Ambulatory Visit (HOSPITAL_BASED_OUTPATIENT_CLINIC_OR_DEPARTMENT_OTHER): Payer: Medicare HMO | Admitting: Hematology

## 2016-11-28 VITALS — BP 100/66 | HR 94 | Temp 98.6°F | Resp 18 | Ht 71.0 in | Wt 195.9 lb

## 2016-11-28 VITALS — BP 102/68 | HR 97

## 2016-11-28 DIAGNOSIS — N08 Glomerular disorders in diseases classified elsewhere: Secondary | ICD-10-CM

## 2016-11-28 DIAGNOSIS — E8581 Light chain (AL) amyloidosis: Secondary | ICD-10-CM

## 2016-11-28 DIAGNOSIS — Z5112 Encounter for antineoplastic immunotherapy: Secondary | ICD-10-CM | POA: Diagnosis not present

## 2016-11-28 DIAGNOSIS — E854 Organ-limited amyloidosis: Secondary | ICD-10-CM

## 2016-11-28 DIAGNOSIS — N049 Nephrotic syndrome with unspecified morphologic changes: Secondary | ICD-10-CM | POA: Diagnosis not present

## 2016-11-28 DIAGNOSIS — R5383 Other fatigue: Secondary | ICD-10-CM

## 2016-11-28 LAB — CBC & DIFF AND RETIC
BASO%: 0.3 % (ref 0.0–2.0)
Basophils Absolute: 0 10*3/uL (ref 0.0–0.1)
EOS%: 0.3 % (ref 0.0–7.0)
Eosinophils Absolute: 0 10*3/uL (ref 0.0–0.5)
HCT: 37 % — ABNORMAL LOW (ref 38.4–49.9)
HEMOGLOBIN: 12.7 g/dL — AB (ref 13.0–17.1)
Immature Retic Fract: 15.5 % — ABNORMAL HIGH (ref 3.00–10.60)
LYMPH%: 2.9 % — AB (ref 14.0–49.0)
MCH: 32 pg (ref 27.2–33.4)
MCHC: 34.3 g/dL (ref 32.0–36.0)
MCV: 93.3 fL (ref 79.3–98.0)
MONO#: 0.2 10*3/uL (ref 0.1–0.9)
MONO%: 2 % (ref 0.0–14.0)
NEUT%: 94.5 % — ABNORMAL HIGH (ref 39.0–75.0)
NEUTROS ABS: 7.1 10*3/uL — AB (ref 1.5–6.5)
NRBC: 5 % — AB (ref 0–0)
PLATELETS: 136 10*3/uL — AB (ref 140–400)
RBC: 3.96 10*6/uL — AB (ref 4.20–5.82)
RDW: 16.9 % — AB (ref 11.0–14.6)
Retic %: 1.8 % (ref 0.80–1.80)
Retic Ct Abs: 71.28 10*3/uL (ref 34.80–93.90)
WBC: 7.5 10*3/uL (ref 4.0–10.3)
lymph#: 0.2 10*3/uL — ABNORMAL LOW (ref 0.9–3.3)

## 2016-11-28 LAB — COMPREHENSIVE METABOLIC PANEL
ALBUMIN: 2.7 g/dL — AB (ref 3.5–5.0)
ALK PHOS: 73 U/L (ref 40–150)
ALT: 24 U/L (ref 0–55)
ANION GAP: 10 meq/L (ref 3–11)
AST: 32 U/L (ref 5–34)
BILIRUBIN TOTAL: 0.62 mg/dL (ref 0.20–1.20)
BUN: 35.3 mg/dL — ABNORMAL HIGH (ref 7.0–26.0)
CO2: 23 mEq/L (ref 22–29)
Calcium: 9 mg/dL (ref 8.4–10.4)
Chloride: 107 mEq/L (ref 98–109)
Creatinine: 1.2 mg/dL (ref 0.7–1.3)
EGFR: 59 mL/min/{1.73_m2} — AB (ref >90)
GLUCOSE: 135 mg/dL (ref 70–140)
POTASSIUM: 4.1 meq/L (ref 3.5–5.1)
SODIUM: 140 meq/L (ref 136–145)
TOTAL PROTEIN: 5.7 g/dL — AB (ref 6.4–8.3)

## 2016-11-28 MED ORDER — BORTEZOMIB CHEMO SQ INJECTION 3.5 MG (2.5MG/ML)
1.3000 mg/m2 | Freq: Once | INTRAMUSCULAR | Status: AC
  Administered 2016-11-28: 2.75 mg via SUBCUTANEOUS
  Filled 2016-11-28: qty 2.75

## 2016-11-28 MED ORDER — PROCHLORPERAZINE MALEATE 10 MG PO TABS
ORAL_TABLET | ORAL | Status: AC
  Filled 2016-11-28: qty 1

## 2016-11-28 MED ORDER — PROCHLORPERAZINE MALEATE 10 MG PO TABS
10.0000 mg | ORAL_TABLET | Freq: Once | ORAL | Status: AC
  Administered 2016-11-28: 10 mg via ORAL

## 2016-11-28 NOTE — Progress Notes (Signed)
[name] Kitchen    HEMATOLOGY/ONCOLOGY CLINIC NOTE  Date of Service: .11/28/2016  Patient Care Team: [name], MD as PCP - General (Internal Medicine) Nephrology - Dr Madelon Lips MD Dr Henrene Pastor MD - GI  CHIEF COMPLAINTS/PURPOSE OF CONSULTATION:  AL Amyloidosis -with Nephrotic syndrome . No overt CHF pEF  HISTORY OF PRESENTING ILLNESS:   Alex [name]. is a wonderful 76 y.o. male who has been referred to [place] by Dr .[name], MD /Dr Madelon Lips MD for evaluation and management of newly diagnosed Kidney AL Amyloidosis with nephrotic syndrome.  Patient has a history of hypertension, dyslipidemia, coronary artery disease, seizure disorder on Dilantin who was apparently in his usual state of health until 3 months ago when he started developing new onset lower extremity edema. Patient had a UA at the time that showed 4+ protein in 24-hour collection revealed 15 g of protein. Additional workup showed a creatinine of 0.8 with an albumin of 2.3 and negative SPEP and UPEP. Apparently had an elevated K/L SFLC ratio of 5.19.  He was urgently referred by his primary care physician to nephrology for additional evaluation. Patient notes no bleeding issues. No nosebleeds. No periorbital bleeding. No abnormal skin rashes. Has had significant NSAID exposure and use to take a fair amount of naproxen for chronic low back pain but has cut down on this.  Due to lack of clear etiology for his nephrotic syndrome the patient underwent a kidney biopsy on 08/06/2015 accession [id number] which showed AL amyloidosis, lambda immunophenotype, [place] red positive. Noted to have severe arteriosclerosis with moderate tubular interstitial scarring.  Patient was referred to [place] for further evaluation and treatment of his AL Amyloidosis, Patient notes that his leg swelling has improved with diuretic therapy.  He notes no weight loss. No night sweats. No focal bone pains. No skin rashes. Lungs normal bleeding or  bruising.  Patient notes that he had a lot of reading online and he and his wife had an extensive list of questions which were answered in detail.  He notes he has had some chronic issues with upper abdominal pain and nausea and that he follows with Dr. Henrene Pastor and is apparently being scheduled for an EGD and colonoscopy according to his report.  Denies having and lacks tongue. No chest pain and no overt new shortness of breath. Patient is uncertain if an echocardiogram has been ordered or scheduled.  INTERVAL HISTORY  [name] is here for a scheduled follow-up for his AL amyloidosis. Today is cycle 5 day 8 of treatment. Last serum free light chains show good hematologic response. 24-hour UPEP from 11/07/2016 showed improvement in 24h protein to about 5g (down from a pretreatment level of 15g/24h). Notes grade 1-2 fatigue and limited appetite . He was recommended to improve po fluid and food intake and temporarily increase his salt intake in light of low normal BP's. No CP/SOB. Agreeable to rehab referral. No fevers/chills/night sweats.  MEDICAL HISTORY:  Past Medical History:  Diagnosis Date  . Amyloidosis ([place])   . Arthritis   . CAD (coronary artery disease)    Lexiscan Myoview 6/14: Normal study, no scar or ischemia, EF 62%  . Colon polyp   . Coronary artery disease    moderate disease by cath 2011  . Diverticulosis of colon   . GERD (gastroesophageal reflux disease)   . Hearing loss   . Hiatal hernia   . Hyperlipidemia   . Hypertension   . MRSA (methicillin resistant staph aureus) culture positive   .  Seizure disorder ([place])   . Seizures ([place])    last in 1970s  Obstructive sleep apnea Gastroesophageal reflux disease Aortic stenosis Erectile dysfunction Peripheral neuropathy Seizure disorder   SURGICAL HISTORY: Past Surgical History:  Procedure Laterality Date  . BUNIONECTOMY    . CARPAL TUNNEL RELEASE  1994   left  . CERVICAL FUSION    . compression fracture      . FOOT SURGERY     right -twice, left foot once  . HERNIA REPAIR     Umbilical with PVP  . KNEE ARTHROSCOPY     left knee  . [place]   left - scope  . NASAL SEPTUM SURGERY    . NOSE SURGERY     twice  . ROTATOR CUFF REPAIR     twice, both shoulders  . ROTATOR CUFF REPAIR    . SPINE SURGERY     C2, C3, C4  . TRIGGER FINGER RELEASE     both hands, twice  . TRIGGER FINGER RELEASE    . VASECTOMY      SOCIAL HISTORY: Social History   Social History  . Marital status: Married    Spouse name: N/A  . Number of children: 2  . Years of education: 12   Occupational History  . Retired    Social History Main Topics  . Smoking status: Former Smoker    Quit date: 04/25/1969  . Smokeless tobacco: Former Systems developer  . Alcohol use 4.2 - 8.4 oz/week    7 - 14 Glasses of wine per week     Comment: socially shots of wiskey  . Drug use: No  . Sexual activity: Not on file   Other Topics Concern  . Not on file   Social History Narrative   ** Merged History Encounter **   Lives at home w/ his wife   Right-handed   Caffeine: occasional Pepsi      Patient is currently retired.   FAMILY HISTORY: Family History  Problem Relation Age of Onset  . Diabetes Father   . Heart disease Father   . Heart attack Father 59  . Hypertension Father   . Hyperlipidemia Unknown   . Diabetes Unknown   . Hypertension Unknown   . Seizures Unknown   . Thyroid disease Unknown   . Colon cancer Neg Hx   . Stroke Neg Hx     ALLERGIES:  is allergic to contrast media [iodinated diagnostic agents] and ramipril.  MEDICATIONS:  Current Outpatient Prescriptions  Medication Sig Dispense Refill  . acyclovir (ZOVIRAX) 400 MG tablet Take 1 tablet (400 mg total) by mouth 2 (two) times daily. 60 tablet 3  . aspirin EC 81 MG tablet Take 81 mg by mouth daily.    [name] Kitchen atorvastatin (LIPITOR) 40 MG tablet Take 40 mg by mouth at bedtime.     . cholecalciferol (VITAMIN D) 1000 units tablet Take 1,000 Units  by mouth daily.    . clindamycin (CLEOCIN-T) 1 % external solution Apply topically 2 (two) times daily. (Patient taking differently: Apply 1 application topically 2 (two) times daily as needed (for rash). ) 60 mL 0  . cyclophosphamide (CYTOXAN) 50 MG tablet Take 17 tablets (850 mg total) by mouth. Take on days 1,8,15 of chemo 1hr before or 2hr after meals every 21 days. AL Amyloidosis ICD:E85.81 51 tablet 3  . dexamethasone (DECADRON) 4 MG tablet Take 10 tablets (40 mg) on days 1, 8, and 15 of chemo. Repeat every 21 days.  30 tablet 3  . ezetimibe (ZETIA) 10 MG tablet Take 10 mg by mouth daily.     [name] Kitchen HYDROcodone-acetaminophen (NORCO/VICODIN) 5-325 MG tablet Take 1-2 tablets by mouth every 4 (four) hours as needed. (Patient taking differently: Take 1-2 tablets by mouth every 4 (four) hours as needed. ) 10 tablet 0  . isosorbide mononitrate (IMDUR) 30 MG 24 hr tablet Take 0.5 tablets (15 mg total) by mouth daily. 45 tablet 3  . mupirocin cream (BACTROBAN) 2 % Apply 1 application topically 2 (two) times daily. (Patient taking differently: Apply 1 application topically 2 (two) times daily as needed (for rash/irritation). ) 30 g 0  . nitroGLYCERIN (NITROSTAT) 0.4 MG SL tablet Place 1 tablet (0.4 mg total) under the tongue every 5 (five) minutes as needed for chest pain. 25 tablet 6  . omeprazole (PRILOSEC) 20 MG capsule Take 20 mg by mouth daily.      . ondansetron (ZOFRAN) 8 MG tablet Take 1 tablet (8 mg total) by mouth 2 (two) times daily as needed for refractory nausea / vomiting. Start on D 4 and 11. Do not take on D8. 30 tablet 1  . potassium chloride SA (K-DUR,KLOR-CON) 20 MEQ tablet Take 20 mEq by mouth at bedtime.     . prochlorperazine (COMPAZINE) 10 MG tablet Take 1 tablet (10 mg total) by mouth every 6 (six) hours as needed (Nausea or vomiting). 30 tablet 1  . triamcinolone cream (KENALOG) 0.1 % Apply 1 application topically 2 (two) times daily as needed (for skin irritation).      No current  facility-administered medications for this visit.     REVIEW OF SYSTEMS:    10 Point review of Systems was done is negative except as noted above.  PHYSICAL EXAMINATION: ECOG PERFORMANCE STATUS: 1 - Symptomatic but completely ambulatory  . Vitals:   11/28/16 1153  BP: 100/66  Pulse: 94  Resp: 18  Temp: 98.6 F (37 C)   Filed Weights   11/28/16 1153  Weight: 195 lb 14.4 oz (88.9 kg)   .Body mass index is 27.32 kg/m.  GENERAL:alert, in no acute distress and comfortable SKIN: no acute rashes, no significant lesions EYES: conjunctiva are pink and non-injected, sclera anicteric OROPHARYNX: MMM, no exudates, no oropharyngeal erythema or ulceration, no macroglossia NECK: supple, mild JVD LYMPH:  no palpable lymphadenopathy in the cervical, axillary or inguinal regions LUNGS: clear to auscultation b/l with normal respiratory effort HEART: regular rate & rhythm ABDOMEN:  normoactive bowel sounds , non tender, not distended. Extremity: 1+ pitting pedal edema bilaterally PSYCH: alert & oriented x 3 with fluent speech NEURO: no focal motor/sensory deficits  LABORATORY DATA:  I have reviewed the data as listed  . CBC Latest Ref Rng & Units 11/28/2016 11/21/2016 11/16/2016  WBC 4.0 - 10.3 10e3/uL 7.5 7.1 6.1  Hemoglobin 13.0 - 17.1 g/dL 12.7(L) 11.9(L) 10.5(L)  Hematocrit 38.4 - 49.9 % 37.0(L) 34.9(L) 29.5(L)  Platelets 140 - 400 10e3/uL 136(L) 248 110(L)    CMP Latest Ref Rng & Units 11/28/2016 11/21/2016 11/16/2016  Glucose 70 - 140 mg/dl 135 108 100(H)  BUN 7.0 - 26.0 mg/dL 35.3(H) 27.3(H) 37(H)  Creatinine 0.7 - 1.3 mg/dL 1.2 1.0 1.13  Sodium 136 - 145 mEq/L 140 142 135  Potassium 3.5 - 5.1 mEq/L 4.1 4.0 4.5  Chloride 101 - 111 mmol/L - - 105  CO2 22 - 29 mEq/L _0 Calcium 8.4 - 10.4 mg/dL 9.0 9.0 8.0(L)  Total Protein 6.4 - 8.3 g/dL  5.7(L) 5.2(L) 4.9(L)  Total Bilirubin 0.20 - 1.20 mg/dL 0.62 0.66 0.5  Alkaline Phos 40 - 150 U/L 73 63 59  AST 5 - 34 U/L 32 22 27    ALT 0 - 55 U/L _0 Component     Latest Ref Rng & Units 08/19/2016  Protime     10.6 - 13.4 Seconds 12.0  INR     2.00 - 3.50 1.00 (L)  Lovenox      No  LDH     125 - 245 U/L 207  Beta 2     0.6 - 2.4 mg/L 3.0 (H)  APTT     24 - 33 sec 29  Hepatitis B Surface Ag     Negative Negative  Hep B Core Ab, Tot     Negative Negative  Hep C Virus Ab     0.0 - 0.9 s/co ratio <0.1              RADIOGRAPHIC STUDIES: I have personally reviewed the radiological images as listed and agreed with the findings in the report. Ct Angio Chest Pe W/cm &/or Wo Cm  Result Date: 11/16/2016 CLINICAL DATA:  76 year old male with acute shortness of breath and hypotension. Patient with amyloidosis on chemotherapy. EXAM: CT ANGIOGRAPHY CHEST WITH CONTRAST TECHNIQUE: Multidetector CT imaging of the chest was performed using the standard protocol during bolus administration of intravenous contrast. Multiplanar CT image reconstructions and MIPs were obtained to evaluate the vascular anatomy. CONTRAST:  80 cc intravenous Isovue 370 COMPARISON:  11/16/2016 and prior chest radiographs FINDINGS: Cardiovascular: This is a technically satisfactory study. No pulmonary emboli are identified. Cardiomegaly and heavy coronary artery calcifications noted. Ectasia of the ascending and transverse thoracic aorta noted, measuring up to 3.6 cm in greatest diameter. No pericardial effusion. Mediastinum/Nodes: No enlarged mediastinal, hilar, or axillary lymph nodes. Thyroid gland, trachea, and esophagus demonstrate no significant findings. Lungs/Pleura: Peripheral streaky/interlobular opacities at the lung bases noted and may represent atelectasis or possibly interstitial lung disease. There are several noncalcified pulmonary nodules as follows: A 6 mm right middle lobe nodule (image 49) A 6 mm peripheral left upper lobe nodule (image 33) A 6 mm left upper lobe/lingular nodule (sees image 47) A 5 mm lingular nodule  (image 58). No definite airspace disease identified. Mild central ground-glass opacities are noted. No pleural effusion or pneumothorax. Upper Abdomen: No acute abnormality Musculoskeletal: No chest wall abnormality. No acute or significant osseous findings. Review of the MIP images confirms the above findings. IMPRESSION: No evidence of pulmonary emboli. Cardiomegaly, coronary artery disease and ectatic thoracic aorta. Nonspecific mild central ground-glass opacities which could represent mild edema. Hypersensitivity, other inflammatory processes or small airway/ small-vessel disease disease are considerations. Bilateral noncalcified pulmonary nodules measuring 5-6 mm. Non-contrast chest CT at 3-6 months is recommended. If the nodules are stable at time of repeat CT, then future CT at 18-24 months (from today's scan) is considered optional for low-risk patients, but is recommended for high-risk patients. This recommendation follows the consensus statement: Guidelines for Management of Incidental Pulmonary Nodules Detected on CT Images: From the Fleischner Society 2017; Radiology 2017; 284:228-243. Mild peripheral opacities within both lower lungs -interstitial lung disease is not excluded. Aortic Atherosclerosis (ICD10-I70.0). Electronically Signed   By: Margarette Canada M.D.   On: 11/16/2016 18:36   Dg Chest Port 1 View  Result Date: 11/16/2016 CLINICAL DATA:  Hypertension, shortness of breath EXAM: PORTABLE CHEST 1 VIEW COMPARISON:  10/18/2016 FINDINGS: There  is no focal parenchymal opacity. There is no pleural effusion or pneumothorax. The heart and mediastinal contours are unremarkable. The osseous structures are unremarkable. IMPRESSION: No active disease. Electronically Signed   By: Kathreen Devoid   On: 11/16/2016 13:36   Result status: Edited Result - FINAL                              *[facility] [employer].                         [place], [place] [address]                            [phone]  ------------------------------------------------------------------- Transthoracic Echocardiography  (Report amended )  Patient:    Zlatan, Hornback MR #:       [id number] Study Date: 08/24/2016 Gender:     M Age:        22 Height:     180.3 cm Weight:     90.6 kg BSA:        2.15 m^2 Pt. Status: Room:   ATTENDING    Darlina Guys, MD  Willia Craze, Christopher  REFERRING    Mc[place]hany, [place], Outpatient  SONOGRAPHER  Roseanna Rainbow  cc:  ------------------------------------------------------------------- LV EF: 65% -   70%  ------------------------------------------------------------------- Indications:      Aortic stenosis 424.1.  ------------------------------------------------------------------- History:   PMH:  Amyloidosis.  Coronary artery disease.  Angina pectoris.  Risk factors:  Hypertension. Dyslipidemia.  ------------------------------------------------------------------- Study Conclusions  - Left ventricle: The cavity size was normal. Wall thickness was   increased in a pattern of mild LVH. Systolic function was   vigorous. The estimated ejection fraction was in the range of 65%   to 70%. Wall motion was normal; there were no regional wall   motion abnormalities. Doppler parameters are consistent with   abnormal left ventricular relaxation (grade 1 diastolic   dysfunction). - Aortic valve: Valve mobility was mildly restricted. - Aortic root: The aortic root was mildly dilated. - Mitral valve: Calcified annulus.  Impressions:  - Vigorous LV systolic function; mild diastolic dysfunction; mild   LVH; calcified aortic valve with no significant AS (peak velocity   2 m/s; mean gradient 9 mmHg); mildly dilated aortic root.    ASSESSMENT & PLAN:   76 year old male with above-mentioned multiple medical comorbidities with  #1 Newly diagnosed  biopsy proven renal and Systemic AL Amyloidosis BM Bx shows 9% plasma cells and amyloid deposits. Echo shows normal systolic function with only grade 1 diastolic dysfunction. Skeletal survey with no lytic lesions suggestive of multiple myeloma. Serum K/L ratio increased at about 5.13.now improved to 2 UPEP shows 24h protein improved from 15g to 5g daily  #2 Newly diagnosed Nephrotic Syndrome - likely related to AL amyloidosis. As per nephrology records 24 urine showed 15 g of protein per day. Rpt random Urine protein/creatinine ratio from 5/14 showed decrease to 8.7g/g creatinine  Plan --Blood counts stable. -Minimal grade 1 neuropathy from Velcade. -continue Velcade and weekly dexaethasone and Cytoxan.  -  SFLC and 24-hour urine after each cycle of treatment. We will treat to maximum response followed by maintenance therapy.  #3 Grade 2 fatigue -given referral to dietician and cancer rehab to optimize nutrition and for strength and endurance training respectively. -recommended increased salt intake. -soft BP could be from decreased plasma oncotic pressures from low albumin due ton ephrotic syndrome.  #4. Seizure disorder in her the patient has been on Dilantin from one 10 years. Patient knows that he has not had a seizure for very long time. -continue on Keppra  #5 Patient Active Problem List   Diagnosis Date Noted  . Folliculitis 53/20/2334  . AL amyloidosis ([place]) 08/21/2016  . Umbilical hernia 35/68/6168  . DIZZINESS 02/19/2010  . CAD, NATIVE VESSEL 07/21/2009  . CHEST PAIN-UNSPECIFIED 07/21/2009  . HYPERLIPIDEMIA 04/30/2007  . HYPERTENSION 04/30/2007  . GASTROESOPHAGEAL REFLUX DISEASE 04/30/2007  . SEIZURE DISORDER 04/30/2007  . COMPRESSION FRACTURE, L1 VERTEBRA 04/30/2007  . DIVERTICULOSIS, COLON, HX OF 04/30/2007  . Other postprocedural status(V45.89) 04/30/2007  -Continue follow-up with primary care physician for management of other medical co-morbidities.  Referral to  cancer rehab for strength and endurance building Continue labs D1 and D8 every cycle plz schedule C6 of induction treatment RTC with Dr [name] in [phone] with labs   All of the patients and his wife's questions were answered to their apparent satisfaction. They are agreeable with the plan as noted above .The patient knows to call the clinic with any problems, questions or concerns.  I spent 20 minutes counseling the patient face to face. The total time spent in the appointment was 25 minutes and more than 50% was on counseling and direct patient cares.    Sullivan Lone MD [place] AAHIVMS [facility] Hematology/Oncology Physician [facility]  (Office):       [phone] (Work cell):  [phone] (Fax):           [phone]

## 2016-11-28 NOTE — Progress Notes (Signed)
Nutrition follow-up completed with patient receiving treatment for kidney AL amyloidosis with nephrotic syndrome. Weight decreased and documented as 195 pounds down from 205 pounds June 1. Patient reports taste alterations limiting his ability to eat more. He is drinking boost plus and enjoys it out of the bottle or mixed into a milk shake. Patient's wife is making smoothies.  Nutrition diagnosis: Inadequate oral intake continues.  Intervention: Educated patient to increase boost +3 times a day and continue high-calorie high-protein foods. Stressed the importance of strategies to improve taste alterations. Encouraged patient to use baking soda and salt water rinses more often. Teach back method used.  Questions were answered.  Monitoring, evaluation, goals: Patient will work to increase calories and protein to minimize further weight loss.  Next visit: Monday, August 20, during infusion.  **Disclaimer: This note was dictated with voice recognition software. Similar sounding words can inadvertently be transcribed and this note may contain transcription errors which may not have been corrected upon publication of note.**

## 2016-11-28 NOTE — Patient Instructions (Signed)
Thank you for choosing [facility] to provide your oncology and hematology care.  To afford each patient quality time with our providers, please arrive 30 minutes before your scheduled appointment time.  If you arrive late for your appointment, you may be asked to reschedule.  We strive to give you quality time with our providers, and arriving late affects you and other patients whose appointments are after yours.   If you are a no show for multiple scheduled visits, you may be dismissed from the clinic at the providers discretion.    Again, thank you for choosing [facility], our hope is that these requests will decrease the amount of time that you wait before being seen by our physicians.  ______________________________________________________________________  Should you have questions after your visit to the [facility], please contact our office at [phone] between the hours of 8:30 and 4:30 p.m.    Voicemails left after 4:30p.m will not be returned until the following business day.    For prescription refill requests, please have your pharmacy contact us directly.  Please also try to allow 48 hours for prescription requests.    Please contact the scheduling department for questions regarding scheduling.  For scheduling of procedures such as PET scans, CT scans, MRI, Ultrasound, etc please contact central scheduling at [phone].    Resources For Cancer Patients and Caregivers:   Oncolink.org:  A wonderful resource for patients and healthcare providers for information regarding your disease, ways to tract your treatment, what to expect, etc.     American Cancer Society:  800-227-2345  Can help patients locate various types of support and financial assistance  Cancer Care: 1-800-813-HOPE (4673) Provides financial assistance, online support groups, medication/co-pay assistance.    [place] DSS:  336-641-3447 Where to apply for food  stamps, Medicaid, and utility assistance  Medicare Rights Center: 800-333-4114 Helps people with Medicare understand their rights and benefits, navigate the Medicare system, and secure the quality healthcare they deserve  SCAT: 336-333-6589 [place] [employer]'s shared-ride transportation service for eligible riders who have a disability that prevents them from riding the fixed route bus.    For additional information on assistance programs please contact our social worker:   Grier Hock/Abigail Elmore:  [phone]            

## 2016-11-28 NOTE — Progress Notes (Signed)
[name] Kitchen    HEMATOLOGY/ONCOLOGY CLINIC NOTE  Date of Service: .11/14/2016  Patient Care Team: [name], MD as PCP - General (Internal Medicine) Nephrology - Dr Madelon Lips MD Dr Henrene Pastor MD - GI  CHIEF COMPLAINTS/PURPOSE OF CONSULTATION:  AL Amyloidosis -newly diagnosed  HISTORY OF PRESENTING ILLNESS:   Alex Burruss. is a wonderful 76 y.o. male who has been referred to [place] by Dr .[name], MD /Dr Madelon Lips MD for evaluation and management of newly diagnosed Kidney AL Amyloidosis with nephrotic syndrome.  Patient has a history of hypertension, dyslipidemia, coronary artery disease, seizure disorder on Dilantin who was apparently in his usual state of health until 3 months ago when he started developing new onset lower extremity edema. Patient had a UA at the time that showed 4+ protein in 24-hour collection revealed 15 g of protein. Additional workup showed a creatinine of 0.8 with an albumin of 2.3 and negative SPEP and UPEP. Apparently had an elevated K/L SFLC ratio of 5.19.  He was urgently referred by his primary care physician to nephrology for additional evaluation. Patient notes no bleeding issues. No nosebleeds. No periorbital bleeding. No abnormal skin rashes. Has had significant NSAID exposure and use to take a fair amount of naproxen for chronic low back pain but has cut down on this.  Due to lack of clear etiology for his nephrotic syndrome the patient underwent a kidney biopsy on 08/06/2015 accession [id number] which showed AL amyloidosis, lambda immunophenotype, [place] red positive. Noted to have severe arteriosclerosis with moderate tubular interstitial scarring.  Patient was referred to [place] for further evaluation and treatment of his AL Amyloidosis Patient notes that his leg swelling has improved with diuretic therapy.  He notes no weight loss. No night sweats. No focal bone pains. No skin rashes. Lungs normal bleeding or bruising.  Patient notes that  he had a lot of reading online and he and his wife had an extensive list of questions which were answered in detail.  He notes he has had some chronic issues with upper abdominal pain and nausea and that he follows with Dr. Henrene Pastor and is apparently being scheduled for an EGD and colonoscopy according to his report.  Denies having and lacks tongue. No chest pain and no overt new shortness of breath. Patient is uncertain if an echocardiogram has been ordered or scheduled.  INTERVAL HISTORY  Alex [name] is here for a scheduled follow-up for his AL amyloidosis. Today is cycle 4 day 15 of treatment.  Notes grade 1 fatigue from treatment. Decreased by mouth intake. He was counseled to increase his by mouth fluid and food intake. His UPEP from 10/13/2016 appears inaccurate since it was calculated based on 6L of urine (patient notes he had only 1L of urine collected). UPEP from 7/19 pending. SFLC show continued response.    MEDICAL HISTORY:  Past Medical History:  Diagnosis Date  . Amyloidosis ([place])   . Arthritis   . CAD (coronary artery disease)    Lexiscan Myoview 6/14: Normal study, no scar or ischemia, EF 62%  . Colon polyp   . Coronary artery disease    moderate disease by cath 2011  . Diverticulosis of colon   . GERD (gastroesophageal reflux disease)   . Hearing loss   . Hiatal hernia   . Hyperlipidemia   . Hypertension   . MRSA (methicillin resistant staph aureus) culture positive   . Seizure disorder ([place])   . Seizures ([place])    last in 1970s  Obstructive sleep apnea Gastroesophageal reflux disease Aortic stenosis Erectile dysfunction Peripheral neuropathy Seizure disorder   SURGICAL HISTORY: Past Surgical History:  Procedure Laterality Date  . BUNIONECTOMY    . CARPAL TUNNEL RELEASE  1994   left  . CERVICAL FUSION    . compression fracture    . FOOT SURGERY     right -twice, left foot once  . HERNIA REPAIR     Umbilical with PVP  . KNEE ARTHROSCOPY     left knee    . [place]   left - scope  . NASAL SEPTUM SURGERY    . NOSE SURGERY     twice  . ROTATOR CUFF REPAIR     twice, both shoulders  . ROTATOR CUFF REPAIR    . SPINE SURGERY     C2, C3, C4  . TRIGGER FINGER RELEASE     both hands, twice  . TRIGGER FINGER RELEASE    . VASECTOMY      SOCIAL HISTORY: Social History   Social History  . Marital status: Married    Spouse name: N/A  . Number of children: 2  . Years of education: 12   Occupational History  . Retired    Social History Main Topics  . Smoking status: Former Smoker    Quit date: 04/25/1969  . Smokeless tobacco: Former Systems developer  . Alcohol use 4.2 - 8.4 oz/week    7 - 14 Glasses of wine per week     Comment: socially shots of wiskey  . Drug use: No  . Sexual activity: Not on file   Other Topics Concern  . Not on file   Social History Narrative   ** Merged History Encounter **   Lives at home w/ his wife   Right-handed   Caffeine: occasional Pepsi      Patient is currently retired.   FAMILY HISTORY: Family History  Problem Relation Age of Onset  . Diabetes Father   . Heart disease Father   . Heart attack Father 79  . Hypertension Father   . Hyperlipidemia Unknown   . Diabetes Unknown   . Hypertension Unknown   . Seizures Unknown   . Thyroid disease Unknown   . Colon cancer Neg Hx   . Stroke Neg Hx     ALLERGIES:  is allergic to contrast media [iodinated diagnostic agents] and ramipril.  MEDICATIONS:  Current Outpatient Prescriptions  Medication Sig Dispense Refill  . acyclovir (ZOVIRAX) 400 MG tablet Take 1 tablet (400 mg total) by mouth 2 (two) times daily. 60 tablet 3  . aspirin EC 81 MG tablet Take 81 mg by mouth daily.    [name] Kitchen atorvastatin (LIPITOR) 40 MG tablet Take 40 mg by mouth at bedtime.     . cholecalciferol (VITAMIN D) 1000 units tablet Take 1,000 Units by mouth daily.    . clindamycin (CLEOCIN-T) 1 % external solution Apply topically 2 (two) times daily. (Patient taking  differently: Apply 1 application topically 2 (two) times daily as needed (for rash). ) 60 mL 0  . cyclophosphamide (CYTOXAN) 50 MG tablet Take 17 tablets (850 mg total) by mouth. Take on days 1,8,15 of chemo 1hr before or 2hr after meals every 21 days. AL Amyloidosis ICD:E85.81 51 tablet 3  . dexamethasone (DECADRON) 4 MG tablet Take 10 tablets (40 mg) on days 1, 8, and 15 of chemo. Repeat every 21 days. 30 tablet 3  . ezetimibe (ZETIA) 10 MG tablet Take 10 mg by mouth  daily.     [name] Kitchen HYDROcodone-acetaminophen (NORCO/VICODIN) 5-325 MG tablet Take 1-2 tablets by mouth every 4 (four) hours as needed. (Patient taking differently: Take 1-2 tablets by mouth every 4 (four) hours as needed. ) 10 tablet 0  . isosorbide mononitrate (IMDUR) 30 MG 24 hr tablet Take 0.5 tablets (15 mg total) by mouth daily. 45 tablet 3  . mupirocin cream (BACTROBAN) 2 % Apply 1 application topically 2 (two) times daily. (Patient taking differently: Apply 1 application topically 2 (two) times daily as needed (for rash/irritation). ) 30 g 0  . nitroGLYCERIN (NITROSTAT) 0.4 MG SL tablet Place 1 tablet (0.4 mg total) under the tongue every 5 (five) minutes as needed for chest pain. 25 tablet 6  . omeprazole (PRILOSEC) 20 MG capsule Take 20 mg by mouth daily.      . ondansetron (ZOFRAN) 8 MG tablet Take 1 tablet (8 mg total) by mouth 2 (two) times daily as needed for refractory nausea / vomiting. Start on D 4 and 11. Do not take on D8. 30 tablet 1  . potassium chloride SA (K-DUR,KLOR-CON) 20 MEQ tablet Take 20 mEq by mouth at bedtime.     . prochlorperazine (COMPAZINE) 10 MG tablet Take 1 tablet (10 mg total) by mouth every 6 (six) hours as needed (Nausea or vomiting). 30 tablet 1  . triamcinolone cream (KENALOG) 0.1 % Apply 1 application topically 2 (two) times daily as needed (for skin irritation).      No current facility-administered medications for this visit.     REVIEW OF SYSTEMS:    10 Point review of Systems was done is  negative except as noted above.  PHYSICAL EXAMINATION: ECOG PERFORMANCE STATUS: 1 - Symptomatic but completely ambulatory  . Vitals:   11/14/16 1011  BP: 116/82  Pulse: 80  Resp: 18  Temp: 97.8 F (36.6 C)   Filed Weights   11/14/16 1011  Weight: 199 lb 14.4 oz (90.7 kg)   .Body mass index is 27.88 kg/m.  GENERAL:alert, in no acute distress and comfortable SKIN: no acute rashes, no significant lesions EYES: conjunctiva are pink and non-injected, sclera anicteric OROPHARYNX: MMM, no exudates, no oropharyngeal erythema or ulceration, no macroglossia NECK: supple, mild JVD LYMPH:  no palpable lymphadenopathy in the cervical, axillary or inguinal regions LUNGS: clear to auscultation b/l with normal respiratory effort HEART: regular rate & rhythm ABDOMEN:  normoactive bowel sounds , non tender, not distended. Extremity: 1+ pitting pedal edema bilaterally PSYCH: alert & oriented x 3 with fluent speech NEURO: no focal motor/sensory deficits  LABORATORY DATA:  I have reviewed the data as listed  Component     Latest Ref Rng & Units 11/14/2016  WBC     4.0 - 10.3 10e3/uL 6.9  NEUT#     1.5 - 6.5 10e3/uL 6.5  Hemoglobin     13.0 - 17.1 g/dL 11.4 (L)  HCT     38.4 - 49.9 % 32.9 (L)  Platelets     140 - 400 10e3/uL 59 Large platelets present (L)  MCV     79.3 - 98.0 fL 88.9  MCH     27.2 - 33.4 pg 30.8  MCHC     32.0 - 36.0 g/dL 34.7  RBC     4.20 - 5.82 10e6/uL 3.70 (L)  RDW     11.0 - 14.6 % 15.4 (H)  lymph#     0.9 - 3.3 10e3/uL 0.2 (L)  MONO#     0.1 - 0.9 10e3/uL 0.2  Eosinophils Absolute     0.0 - 0.5 10e3/uL 0.0  Basophils Absolute     0.0 - 0.1 10e3/uL 0.0  NEUT%     39.0 - 75.0 % 93.8 (H)  LYMPH%     14.0 - 49.0 % 3.2 (L)  MONO%     0.0 - 14.0 % 2.6  EOS%     0.0 - 7.0 % 0.1  BASO%     0.0 - 2.0 % 0.3  nRBC     0 - 0 % 5 (H)  Retic %     0.80 - 1.80 % 0.97  Retic Ct Abs     34.80 - 93.90 10e3/uL 35.89  Immature Retic Fract     3.00 -  10.60 % 14.70 (H)  Sodium     136 - 145 mEq/L 139  Potassium     3.5 - 5.1 mEq/L 4.5  Chloride     98 - 109 mEq/L 108  CO2     22 - 29 mEq/L 22  Glucose     70 - 140 mg/dl 105  BUN     7.0 - 26.0 mg/dL 28.3 (H)  Creatinine     0.7 - 1.3 mg/dL 1.0  Total Bilirubin     0.20 - 1.20 mg/dL 0.57  Alkaline Phosphatase     40 - 150 U/L 75  AST     5 - 34 U/L 26  ALT     0 - 55 U/L 24  Total Protein     6.4 - 8.3 g/dL 5.6 (L)  Albumin     3.5 - 5.0 g/dL 2.7 (L)  Calcium     8.4 - 10.4 mg/dL 9.1  Anion gap     3 - 11 mEq/L 9  EGFR     >90 ml/min/1.73 m2 73 (L)            RADIOGRAPHIC STUDIES: I have personally reviewed the radiological images as listed and agreed with the findings in the report. Ct Angio Chest Pe W/cm &/or Wo Cm  Result Date: 11/16/2016 CLINICAL DATA:  76 year old male with acute shortness of breath and hypotension. Patient with amyloidosis on chemotherapy. EXAM: CT ANGIOGRAPHY CHEST WITH CONTRAST TECHNIQUE: Multidetector CT imaging of the chest was performed using the standard protocol during bolus administration of intravenous contrast. Multiplanar CT image reconstructions and MIPs were obtained to evaluate the vascular anatomy. CONTRAST:  80 cc intravenous Isovue 370 COMPARISON:  11/16/2016 and prior chest radiographs FINDINGS: Cardiovascular: This is a technically satisfactory study. No pulmonary emboli are identified. Cardiomegaly and heavy coronary artery calcifications noted. Ectasia of the ascending and transverse thoracic aorta noted, measuring up to 3.6 cm in greatest diameter. No pericardial effusion. Mediastinum/Nodes: No enlarged mediastinal, hilar, or axillary lymph nodes. Thyroid gland, trachea, and esophagus demonstrate no significant findings. Lungs/Pleura: Peripheral streaky/interlobular opacities at the lung bases noted and may represent atelectasis or possibly interstitial lung disease. There are several noncalcified pulmonary nodules as  follows: A 6 mm right middle lobe nodule (image 49) A 6 mm peripheral left upper lobe nodule (image 33) A 6 mm left upper lobe/lingular nodule (sees image 47) A 5 mm lingular nodule (image 58). No definite airspace disease identified. Mild central ground-glass opacities are noted. No pleural effusion or pneumothorax. Upper Abdomen: No acute abnormality Musculoskeletal: No chest wall abnormality. No acute or significant osseous findings. Review of the MIP images confirms the above findings. IMPRESSION: No evidence of pulmonary emboli. Cardiomegaly, coronary artery disease and ectatic  thoracic aorta. Nonspecific mild central ground-glass opacities which could represent mild edema. Hypersensitivity, other inflammatory processes or small airway/ small-vessel disease disease are considerations. Bilateral noncalcified pulmonary nodules measuring 5-6 mm. Non-contrast chest CT at 3-6 months is recommended. If the nodules are stable at time of repeat CT, then future CT at 18-24 months (from today's scan) is considered optional for low-risk patients, but is recommended for high-risk patients. This recommendation follows the consensus statement: Guidelines for Management of Incidental Pulmonary Nodules Detected on CT Images: From the Fleischner Society 2017; Radiology 2017; 284:228-243. Mild peripheral opacities within both lower lungs -interstitial lung disease is not excluded. Aortic Atherosclerosis (ICD10-I70.0). Electronically Signed   By: Margarette Canada M.D.   On: 11/16/2016 18:36   Dg Chest Port 1 View  Result Date: 11/16/2016 CLINICAL DATA:  Hypertension, shortness of breath EXAM: PORTABLE CHEST 1 VIEW COMPARISON:  10/18/2016 FINDINGS: There is no focal parenchymal opacity. There is no pleural effusion or pneumothorax. The heart and mediastinal contours are unremarkable. The osseous structures are unremarkable. IMPRESSION: No active disease. Electronically Signed   By: Kathreen Devoid   On: 11/16/2016 13:36   Result  status: Edited Result - FINAL                              *[facility] [employer].                        [place], [place] [address]                            [phone]  ------------------------------------------------------------------- Transthoracic Echocardiography  (Report amended )  Patient:    Alex [name], Alex [name] Alex #:       [id number] Study Date: 08/24/2016 Gender:     M Age:        26 Height:     180.3 cm Weight:     90.6 kg BSA:        2.15 m^2 Pt. Status: Room:   ATTENDING    Darlina Guys, MD  Willia Craze, Christopher  REFERRING    [place] [place]  SONOGRAPHER  Roseanna Rainbow  cc:  ------------------------------------------------------------------- LV EF: 65% -   70%  ------------------------------------------------------------------- Indications:      Aortic stenosis 424.1.  ------------------------------------------------------------------- History:   PMH:  Amyloidosis.  Coronary artery disease.  Angina pectoris.  Risk factors:  Hypertension. Dyslipidemia.  ------------------------------------------------------------------- Study Conclusions  - Left ventricle: The cavity size was normal. Wall thickness was   increased in a pattern of mild LVH. Systolic function was   vigorous. The estimated ejection fraction was in the range of 65%   to 70%. Wall motion was normal; there were no regional wall   motion abnormalities. Doppler parameters are consistent with   abnormal left ventricular relaxation (grade 1 diastolic   dysfunction). - Aortic valve: Valve mobility was mildly restricted. - Aortic root: The aortic root was mildly dilated. - Mitral valve: Calcified annulus.  Impressions:  - Vigorous LV systolic function; mild diastolic dysfunction; mild   LVH; calcified aortic  valve with no significant AS (peak velocity    2 m/s; mean gradient 9 mmHg); mildly dilated aortic root.    ASSESSMENT & PLAN:   76 year old male with above-mentioned multiple medical comorbidities with  #1 Newly diagnosed biopsy proven renal and Systemic AL Amyloidosis BM Bx shows 9% plasma cells and amyloid deposits. Echo shows normal systolic function with only grade 1 diastolic dysfunction. Skeletal survey with no lytic lesions suggestive of multiple myeloma. Serum K/L ratio increased at about 5.13. Now has progressively improved to K/L ratio of 2  #2 Newly diagnosed Nephrotic Syndrome - likely related to AL amyloidosis. As per nephrology records 26 urine showed 15 g of protein per day. Rpt random Urine protein/creatinine ratio from 5/14 showed decrease to 8.7g/g creatinine UPEP from 09/2016 --inaccurate.  Rpt UPEP 7/16 pending  #3 Grade 1-2 fatigue related to treatment  #4 Thrombocytopenia - treatment related -- monitor. Rpt labs 7/25 show improvement in PLT counts from59k to 108k  #5 Minimal grade 1 neuropathy from Velcade.  Plan -Serum free light chain ratio normalizing with treatment suggesting good hematologic response. -Awaiting 24-hour UPEP from 10/10/2016. --continue Velcade and weekly dexaethasone and Cytoxan.  -SFLC and 24-hour urine after each cycle of treatment. We will treat to maximum response followed by maintenance therapy.  #6 abdominal discomfort- chronic along with nausea.resolved  #7. Seizure disorder in her the patient has been on Dilantin from one 10 years. Patient knows that he has not had a seizure for very long time. -continues to be on Keppra. No breakthrough seizures  # 8 Patient had subsequently presented with SOB and soft BP on 7/25 and was given IVF and referred to ED for VQ scan to r/o PE and for additional w/u. He had a CTA chest in the ED - no evidence of PE. No evidence of ACS. Received additional 2 L of IVF.   Patient Active Problem List   Diagnosis Date Noted  . Folliculitis  25/75/0518  . AL amyloidosis ([place]) 08/21/2016  . Umbilical hernia 33/58/2518  . DIZZINESS 02/19/2010  . CAD, NATIVE VESSEL 07/21/2009  . CHEST PAIN-UNSPECIFIED 07/21/2009  . HYPERLIPIDEMIA 04/30/2007  . HYPERTENSION 04/30/2007  . GASTROESOPHAGEAL REFLUX DISEASE 04/30/2007  . SEIZURE DISORDER 04/30/2007  . COMPRESSION FRACTURE, L1 VERTEBRA 04/30/2007  . DIVERTICULOSIS, COLON, HX OF 04/30/2007  . Other postprocedural status(V45.89) 04/30/2007  -Continue follow-up with primary care physician for management of other medical co-morbidities.  -continue treatment as per schedule. Plz schedule C5 and C6 of treatment -RTC with Dr [name] in 2 weeks -labs D1 and D8 every cycle   All of the patients and his wife's questions were answered to their apparent satisfaction. They are agreeable with the plan as noted above .The patient knows to call the clinic with any problems, questions or concerns.  I spent 30 minutes counseling the patient face to face. The total time spent in the appointment was 40 minutes and more than 50% was on counseling and direct patient cares.    Sullivan Lone MD [place] AAHIVMS [facility] Hematology/Oncology Physician [facility]  (Office):       [phone] (Work cell):  [phone] (Fax):           [phone]

## 2016-11-28 NOTE — Patient Instructions (Signed)
[facility] Discharge Instructions for Patients Receiving Chemotherapy  Today you received the following chemotherapy agents Velcade  To help prevent nausea and vomiting after your treatment, we encourage you to take your nausea medication    If you develop nausea and vomiting that is not controlled by your nausea medication, call the clinic.   BELOW ARE SYMPTOMS THAT SHOULD BE REPORTED IMMEDIATELY:  *FEVER GREATER THAN 100.5 F  *CHILLS WITH OR WITHOUT FEVER  NAUSEA AND VOMITING THAT IS NOT CONTROLLED WITH YOUR NAUSEA MEDICATION  *UNUSUAL SHORTNESS OF BREATH  *UNUSUAL BRUISING OR BLEEDING  TENDERNESS IN MOUTH AND THROAT WITH OR WITHOUT PRESENCE OF ULCERS  *URINARY PROBLEMS  *BOWEL PROBLEMS  UNUSUAL RASH Items with * indicate a potential emergency and should be followed up as soon as possible.  Feel free to call the clinic you have any questions or concerns. The clinic phone number is [phone].  Please show the CHEMO ALERT CARD at check-in to the Emergency Department and triage nurse.   

## 2016-11-29 ENCOUNTER — Ambulatory Visit (INDEPENDENT_AMBULATORY_CARE_PROVIDER_SITE_OTHER): Payer: Medicare HMO | Admitting: Neurology

## 2016-11-29 ENCOUNTER — Encounter: Payer: Self-pay | Admitting: Neurology

## 2016-11-29 VITALS — BP 119/68 | HR 71 | Ht 71.0 in | Wt 197.0 lb

## 2016-11-29 DIAGNOSIS — I951 Orthostatic hypotension: Secondary | ICD-10-CM | POA: Diagnosis not present

## 2016-11-29 DIAGNOSIS — R569 Unspecified convulsions: Secondary | ICD-10-CM | POA: Diagnosis not present

## 2016-11-29 NOTE — Progress Notes (Signed)
[facility]    Provider:  Dr Jaynee Eagles Referring Provider: [name], MD Primary Care Physician:  [name], MD  CC:  seizure  Patient has been on antiepileptic medication for 40+ years without seizure. EEG was negative. MRI of the brain was not approved. Patient did not want to have a more extended EEG completed. He understands the risks of coming off the seizure medications including repeat seizures. He stopped the Keppra several weeks ago. Doing well, not feleing bad, no seizures or any episodes, off the medication for at least a month.  Started tapering in June. He feels more tired on the chemo.  However otherwise he is feeling fine. No other significant complaints. Patient is having brief episodes of dizziness when standing or changing positions. Does not happen when he sitting.   HPI:  Alex [name]. is a 76 y.o. male here as a referral from Dr. [name] for seizure. Past medical history hypertension, renal disease, hyperlipidemia, coronary artery disease, chronic back pain, obstructive sleep apnea, erectile dysfunction, hearing problem. He has a seizure disorder and he is was on time allotted and now on Keppra. He is on chemotherapy. He has nephrotic syndrome with a biopsy showing amyloidosis. He is a former smoker. He had first seizure in Aug 08, 1974 after his father died doing yardwork, he had a GTCS. No inciting event, no head trauma.  He had confusion afterwards. He doesn;t remember the events just remembers bing on the floor. Also tired afterwards. Last seizure 1976-08-07. Since then no episodes of LOC,alteration of awareness. He had 2 seizures in total. This was in his early 84s. The seizures were 2 years apart, both in the summertime. At one point was on 2 seizure medications he can;t remember the second one. Wife is here and provides information. No other focal neurologic deficits, associated symptoms, inciting events or modifiable factors. Wife is here and provides much  information as well.   Reviewed notes, labs and imaging from outside physicians, which showed:  Reviewed labs included CMP with BUN 15 and creatinine 0.96, otherwise normal. CBC unremarkable.  Review of Systems: Patient complains of symptoms per HPI as well as the following symptoms murmur, hearing loss, ringing in ears, blood in stool, joint pain, joint swelling, cramps, seizure . Pertinent negatives and positives per HPI. All others negative.     Social History   Social History  . Marital status: Married    Spouse name: N/A  . Number of children: 2  . Years of education: 12   Occupational History  . Retired    Social History Main Topics  . Smoking status: Former Smoker    Quit date: 04/25/1969  . Smokeless tobacco: Former Systems developer  . Alcohol use 4.2 - 8.4 oz/week    7 - 14 Glasses of wine per week     Comment: socially shots of wiskey  . Drug use: No  . Sexual activity: Not on file   Other Topics Concern  . Not on file   Social History Narrative   ** Merged History Encounter **   Lives at home w/ his wife   Right-handed   Caffeine: occasional Pepsi        Family History  Problem Relation Age of Onset  . Diabetes Father   . Heart disease Father   . Heart attack Father 46  . Hypertension Father   . Hyperlipidemia Unknown   . Diabetes Unknown   . Hypertension Unknown   . Seizures Unknown   .  Thyroid disease Unknown   . Colon cancer Neg Hx   . Stroke Neg Hx     Past Medical History:  Diagnosis Date  . Amyloidosis ([place])   . Arthritis   . CAD (coronary artery disease)    Lexiscan Myoview 6/14: Normal study, no scar or ischemia, EF 62%  . Colon polyp   . Coronary artery disease    moderate disease by cath 2011  . Diverticulosis of colon   . GERD (gastroesophageal reflux disease)   . Hearing loss   . Hiatal hernia   . Hyperlipidemia   . Hypertension   . MRSA (methicillin resistant staph aureus) culture positive   . Seizure disorder ([place])   .  Seizures ([place])    last in 1970s    Past Surgical History:  Procedure Laterality Date  . BUNIONECTOMY    . CARPAL TUNNEL RELEASE  1994   left  . CERVICAL FUSION    . compression fracture    . FOOT SURGERY     right -twice, left foot once  . HERNIA REPAIR     Umbilical with PVP  . KNEE ARTHROSCOPY     left knee  . [place]   left - scope  . NASAL SEPTUM SURGERY    . NOSE SURGERY     twice  . ROTATOR CUFF REPAIR     twice, both shoulders  . ROTATOR CUFF REPAIR    . SPINE SURGERY     C2, C3, C4  . TRIGGER FINGER RELEASE     both hands, twice  . TRIGGER FINGER RELEASE    . VASECTOMY      Current Outpatient Prescriptions  Medication Sig Dispense Refill  . acyclovir (ZOVIRAX) 400 MG tablet Take 1 tablet (400 mg total) by mouth 2 (two) times daily. 60 tablet 3  . aspirin EC 81 MG tablet Take 81 mg by mouth daily.    [name] Kitchen atorvastatin (LIPITOR) 40 MG tablet Take 40 mg by mouth at bedtime.     . cholecalciferol (VITAMIN D) 1000 units tablet Take 1,000 Units by mouth daily.    . clindamycin (CLEOCIN-T) 1 % external solution Apply topically 2 (two) times daily. (Patient taking differently: Apply 1 application topically 2 (two) times daily as needed (for rash). ) 60 mL 0  . cyclophosphamide (CYTOXAN) 50 MG capsule     . dexamethasone (DECADRON) 4 MG tablet Take 10 tablets (40 mg) on days 1, 8, and 15 of chemo. Repeat every 21 days. 30 tablet 3  . ezetimibe (ZETIA) 10 MG tablet Take 10 mg by mouth daily.     [name] Kitchen HYDROcodone-acetaminophen (NORCO/VICODIN) 5-325 MG tablet Take 1-2 tablets by mouth every 4 (four) hours as needed. (Patient taking differently: Take 1-2 tablets by mouth every 4 (four) hours as needed. ) 10 tablet 0  . isosorbide mononitrate (IMDUR) 30 MG 24 hr tablet Take 0.5 tablets (15 mg total) by mouth daily. 45 tablet 3  . mupirocin cream (BACTROBAN) 2 % Apply 1 application topically 2 (two) times daily. (Patient taking differently: Apply 1 application topically 2  (two) times daily as needed (for rash/irritation). ) 30 g 0  . nitroGLYCERIN (NITROSTAT) 0.4 MG SL tablet Place 1 tablet (0.4 mg total) under the tongue every 5 (five) minutes as needed for chest pain. 25 tablet 6  . omeprazole (PRILOSEC) 20 MG capsule Take 20 mg by mouth daily.      . ondansetron (ZOFRAN) 8 MG tablet Take 1 tablet (8  mg total) by mouth 2 (two) times daily as needed for refractory nausea / vomiting. Start on D 4 and 11. Do not take on D8. 30 tablet 1  . potassium chloride SA (K-DUR,KLOR-CON) 20 MEQ tablet Take 20 mEq by mouth at bedtime.     . prochlorperazine (COMPAZINE) 10 MG tablet Take 1 tablet (10 mg total) by mouth every 6 (six) hours as needed (Nausea or vomiting). 30 tablet 1  . triamcinolone cream (KENALOG) 0.1 % Apply 1 application topically 2 (two) times daily as needed (for skin irritation).      No current facility-administered medications for this visit.     Allergies as of 11/29/2016 - Review Complete 11/29/2016  Allergen Reaction Noted  . Ramipril Cough     Vitals: BP 119/68 (BP Location: Right Arm, Patient Position: Sitting, Cuff Size: Normal)   Pulse 71   Ht 5\' 11"  (1.803 m)   Wt 197 lb (89.4 kg)   BMI 27.48 kg/m  Last Weight:  Wt Readings from Last 1 Encounters:  11/29/16 197 lb (89.4 kg)   Last Height:   Ht Readings from Last 1 Encounters:  11/29/16 5\' 11"  (1.803 m)     Physical exam: Exam: Gen: NAD, conversant  Neuro: Detailed Neurologic Exam  Speech:    Speech is normal; fluent and spontaneous with normal comprehension.  Cognition:    The patient is oriented to person, place, and time;     recent and remote memory intact;     language fluent;     normal attention, concentration,     fund of knowledge Cranial Nerves:    The pupils are equal, round, and reactive to light. TVisual fields are full to finger confrontation. Extraocular movements are intact. Trigeminal sensation is intact and the muscles of mastication are normal. The  face is symmetric. The palate elevates in the midline. Hearing intact. Voice is normal. Shoulder shrug is normal. The tongue has normal motion without fasciculations.    Gait:    Uses a cane to ambulate   Motor Observation:    No asymmetry, no atrophy, and no involuntary movements noted. Tone:    Normal muscle tone.    Posture:    Posture is normal. normal erect    Strength: Patient has some proximal weakness 4/5 bilaterally     Sensation: intact to LT      Assessment/Plan:  Lovely 76 year old gentleman who had 2 seizures in the 1970s on Dilantin and more recently Keppra. No seizures since then, discussed we can try to slowly discontinue keppra. Cannot ensure he will not have another seizure but given his last one was 40+ years ago it is reasonable to try and take him off of the AEDs unless we find abnormal EEG or a possible seizure focus in the brain which will necessitate further discussion. Will order MRI wo contrast due to his renal amyloidosis.  - Patient has stopped the [place]. Routine EEG was normal. Insurance did not approve MRI of the brain. Patient declines more long-term EEG monitoring and understands the risks. He is doing well without seizures. I recommend that he not drive for 6 months seizure free.  Discussed Patients with epilepsy have a small risk of sudden unexpected death, a condition referred to as sudden unexpected death in epilepsy (SUDEP). SUDEP is defined specifically as the sudden, unexpected, witnessed or unwitnessed, nontraumatic and nondrowning death in patients with epilepsy with or without evidence for a seizure, and excluding documented status epilepticus, in which post  mortem examination does not reveal a structural or toxicologic cause for death   Discussed seizure precautions and driving restrictions should he have an episode. Recommend no driving for 6 months after seizure medication discontinuation but per [place] law he can drive if he has not had a seizure  in more than 6 months.  Patient has a new problem today orthostatic hypotension, we discussed nonpharmacologic steps he can take to try to avoid this as detailed in the patient instructions. He is instructed to follow-up with his primary care and cardiologist.  Sarina Ill, MD  [facility] [address] [place], [place] [address]  Phone [phone] Fax [phone]  A total of 25 minutes was spent face-to-face with this patient. Over half this time was spent on counseling patient on the seizure, orthostatic hypotension  diagnosis and different diagnostic and therapeutic options available.

## 2016-11-29 NOTE — Patient Instructions (Signed)
°  °  Non-Drug Treatment for Low Blood Pressure on Standing: ° °1. Changing Postures: ° Change posture slowly when getting up, especially in the morning ° Hold on to something during the first few minutes after standing up. Do not start walking as soon as you get up from the chair ° Avoid prolonged recumbency or lying down ° Raise the head of the bed by 10 to 20 degrees ° °2. Exercise: ° Perform Isotonic exercise, e.g.recumbent bike, pedaling movements while sitting in a chair ° Avoid exercises where you have to strain  ° °3. Avoid Pooling of blood in legs: ° Wear custom-fitted elastic stockings. The ones which extend to the abdomen work even better. Consider wearing an abdominal binder. ° Perform physical counter-maneuvers, such as crossing legs and tensing leg muscles. ° °4. Eating and Drinking: ° Small meals are recommended. Avoid large meals. Avoid standing suddenly after a large meal ° Avoid alcohol ° Increase intake of fluids and regular salt. A daily intake of up to 10 grams of sodium per day and a fluid intake of 2.0 to 2.5 liters per day (8 to 10 glasses of water) is recommended.  ° Rapid (over 3 minutes) ingestion of approximately 0.5 liter (2 glasses of water) of tap water, raises blood pressure within 5 to 15 minutes and lasts for an hour. ° °5. Other Tips: ° Avoid hot baths. Instead take warm baths ° Maintain a BP record standing and lying down ° If you are only any BP lowering drugs (antihypertensives, diuretics, antidepressants, drugs for prostate, etc) ask your doctor to revisit the need to keep you on these drugs ° If all non-drug therapy fails, ask your doctor about drug therapy  ° °Follow-up with primary care physician. ° ° °

## 2016-12-01 ENCOUNTER — Ambulatory Visit (HOSPITAL_BASED_OUTPATIENT_CLINIC_OR_DEPARTMENT_OTHER): Payer: Medicare HMO

## 2016-12-01 VITALS — BP 119/76 | HR 70 | Temp 97.9°F | Resp 18

## 2016-12-01 DIAGNOSIS — R69 Illness, unspecified: Secondary | ICD-10-CM | POA: Diagnosis not present

## 2016-12-01 DIAGNOSIS — Z5112 Encounter for antineoplastic immunotherapy: Secondary | ICD-10-CM | POA: Diagnosis not present

## 2016-12-01 DIAGNOSIS — N08 Glomerular disorders in diseases classified elsewhere: Secondary | ICD-10-CM

## 2016-12-01 DIAGNOSIS — E860 Dehydration: Secondary | ICD-10-CM

## 2016-12-01 DIAGNOSIS — E854 Organ-limited amyloidosis: Secondary | ICD-10-CM

## 2016-12-01 DIAGNOSIS — E8581 Light chain (AL) amyloidosis: Secondary | ICD-10-CM

## 2016-12-01 MED ORDER — SODIUM CHLORIDE 0.9 % IV SOLN
1000.0000 mL | INTRAVENOUS | Status: DC
  Administered 2016-12-01: 1000 mL via INTRAVENOUS

## 2016-12-01 MED ORDER — BORTEZOMIB CHEMO SQ INJECTION 3.5 MG (2.5MG/ML)
1.3000 mg/m2 | Freq: Once | INTRAMUSCULAR | Status: AC
  Administered 2016-12-01: 2.75 mg via SUBCUTANEOUS
  Filled 2016-12-01: qty 1.1

## 2016-12-01 MED ORDER — ONDANSETRON HCL 8 MG PO TABS: 8.0000 mg | ORAL_TABLET | Freq: Once | ORAL | Status: DC

## 2016-12-01 MED ORDER — PROCHLORPERAZINE MALEATE 10 MG PO TABS: 10.0000 mg | ORAL_TABLET | Freq: Once | ORAL | Status: DC

## 2016-12-01 NOTE — Progress Notes (Signed)
Labs reviewed by MD, ok to treat despite labs, VO for 1L NS.

## 2016-12-05 ENCOUNTER — Other Ambulatory Visit: Payer: Self-pay | Admitting: Hematology

## 2016-12-05 MED ORDER — ERYTHROMYCIN 5 MG/GM OP OINT: 1.0000 "application " | TOPICAL_OINTMENT | Freq: Every day | OPHTHALMIC | 1 refills | Status: DC

## 2016-12-06 ENCOUNTER — Ambulatory Visit: Payer: Medicare HMO | Admitting: Physical Therapy

## 2016-12-06 ENCOUNTER — Telehealth: Payer: Self-pay

## 2016-12-06 NOTE — Telephone Encounter (Signed)
Spoke with patient concerning upcoming appointments for 8/20 and 8/27

## 2016-12-07 ENCOUNTER — Other Ambulatory Visit: Payer: Self-pay | Admitting: Hematology

## 2016-12-07 ENCOUNTER — Telehealth: Payer: Self-pay

## 2016-12-07 ENCOUNTER — Ambulatory Visit: Payer: Medicare HMO | Attending: Hematology | Admitting: Physical Therapy

## 2016-12-07 ENCOUNTER — Other Ambulatory Visit: Payer: Self-pay | Admitting: Cardiovascular Disease

## 2016-12-07 DIAGNOSIS — R293 Abnormal posture: Secondary | ICD-10-CM | POA: Diagnosis present

## 2016-12-07 DIAGNOSIS — M6281 Muscle weakness (generalized): Secondary | ICD-10-CM | POA: Diagnosis present

## 2016-12-07 DIAGNOSIS — R262 Difficulty in walking, not elsewhere classified: Secondary | ICD-10-CM | POA: Diagnosis present

## 2016-12-07 DIAGNOSIS — E8809 Other disorders of plasma-protein metabolism, not elsewhere classified: Secondary | ICD-10-CM

## 2016-12-07 DIAGNOSIS — R5383 Other fatigue: Secondary | ICD-10-CM

## 2016-12-07 NOTE — Therapy (Addendum)
[place], [place], [address] Phone: [phone]   Fax:  [phone]  Physical Therapy Evaluation  Patient Details  Name: Alex [name]. MRN: [id number] Date of Birth: Sep 16, 1940 Referring Provider: Dr. [name]   Encounter Date: 12/07/2016      PT End of Session - 12/07/16 1738    Number of Visits 17   Date for PT Re-Evaluation 02/01/17      Past Medical History:  Diagnosis Date  . Amyloidosis ([place])   . Arthritis   . CAD (coronary artery disease)    Lexiscan Myoview 6/14: Normal study, no scar or ischemia, EF 62%  . Colon polyp   . Coronary artery disease    moderate disease by cath 2011  . Diverticulosis of colon   . GERD (gastroesophageal reflux disease)   . Hearing loss   . Hiatal hernia   . Hyperlipidemia   . Hypertension   . MRSA (methicillin resistant staph aureus) culture positive   . Seizure disorder ([place])   . Seizures ([place])    last in 1970s    Past Surgical History:  Procedure Laterality Date  . BUNIONECTOMY    . CARPAL TUNNEL RELEASE  1994   left  . CERVICAL FUSION    . compression fracture    . FOOT SURGERY     right -twice, left foot once  . HERNIA REPAIR     Umbilical with PVP  . KNEE ARTHROSCOPY     left knee  . [place]   left - scope  . NASAL SEPTUM SURGERY    . NOSE SURGERY     twice  . ROTATOR CUFF REPAIR     twice, both shoulders  . ROTATOR CUFF REPAIR    . SPINE SURGERY     C2, C3, C4  . TRIGGER FINGER RELEASE     both hands, twice  . TRIGGER FINGER RELEASE    . VASECTOMY      There were no vitals filed for this visit.       Subjective Assessment - 12/07/16 1109    Subjective Diagnosed  in March. Pt  has trouble with weakness and getting around likely due to chemo that he gets twice a week  6 treatments a month  Wife states pt is having a hard time this morning and it was difficult to get up , get dressed and walk into therapy.     Patient is accompained by: Family member   Pertinent History AL amyloidosis with nephrotic syndrome was diagosed in March He has weakness, shortness of breath and   pain in legs is from chemo injection ( pt and wife think it is from [facility] )  Past hx includes HTS, dyslipidemia, CAD, seizure disorder and back injury from a work incident resulting in a compression fracture of L1   Limitations --  Pt appeard uncomfortable in sitting with frequent change in position    How long can you stand comfortably? Pt had to sit down after about 40 seconds    Patient Stated Goals to get rid of his cane and be able to walk , wife reports breathing has become an issue in the past month.    Currently in Pain? Yes   Pain Score 5    Pain Location Leg   Pain Orientation Right;Left;Posterior   Pain Descriptors / Indicators Throbbing   Pain Radiating Towards back of both legs    Pain Onset More than  a month ago   Pain Frequency Intermittent   Aggravating Factors  moving    Pain Relieving Factors getting a drip helps            [facility] PT Assessment - 12/07/16 0001      Assessment   Medical Diagnosis amyloidosis with nephrotic syndrome    Referring Provider Dr. [name]    Hand Dominance Right     Precautions   Precautions Fall   Precaution Comments Pt decided on his own he needed a cane because he feels unsteady      Restrictions   Weight Bearing Restrictions No     Balance Screen   Has the patient fallen in the past 6 months No   Has the patient had a decrease in activity level because of a fear of falling?  No   Is the patient reluctant to leave their home because of a fear of falling?  No     Home Ecologist residence   Living Arrangements Spouse/significant other   Available Help at Discharge Available PRN/intermittently   Type of [place] to enter   Entrance Stairs-Number of Steps 1   [place] One level     Prior Function   Level  of [place] Retired   Leisure go fishing., I need to [employer]   Overall Cognitive Status Within Functional Limits for tasks assessed     Observation/Other Assessments   Observations Pt appears uncomfortable with frequent change in position.  He has foward slumped posture and appears dyspneic ( O2 sats 99, HR 73)  It appeard to take a lot of his energy to answer questions. He walks with a cane with forward posture   Skin Integrity no open areas      Observation/Other Assessments-Edema    Edema --  Pt says he no longer has lower extemity swelling      Sensation   Light Touch --  pt says he has no numbness or tingling      Coordination   Gross Motor Movements are Fluid and Coordinated No  limited by generalized weakness     Functional Tests   Functional tests Sit to Stand     Sit to Stand   Comments 7 repetitions of sit to stand in 30 seconds.  Pt very Short of Breath,  4/4 on dyspnea scale ( O2 sat 98 and HR 88) Unable to continue.  Requestst to lie down.      Posture/Postural Control   Posture/Postural Control Postural limitations   Postural Limitations Rounded Shoulders;Forward head;Decreased lumbar lordosis     Strength   Overall Strength Comments Pt has 4+/5 strength to isometric testing in sitting but is not able to perform functional tasks.    Right/Left hand Right;Left   Right Hand Grip (lbs) 80/84/90   Left Hand Grip (lbs) 66/70/75     Bed Mobility   Bed Mobility Supine to Sit;Sit to Supine   Supine to Sit 3: Mod assist   Supine to Sit Details (indicate cue type and reason) cues for log rolling to protect back.    Sit to Supine 3: Mod assist   Sit to Supine - Details (indicate cue type and reason) assist to bring legs up onto mat      Static Standing Balance   Static Standing - Balance Support No upper extremity supported  10 seconds feet side by  side, partial tandem (heel to instep   Static Standing - Level of Assistance  5: Stand by assistance  can stand 2 sec heel to toe and 0 sec on one leg             Objective measurements completed on examination: See above findings.                          [facility] Goals - 12/10/2016 1727      CC Long Term Goal  #1   Title Pt will be able to participate in 6 minute walk test.    Time 6   Period Weeks   Status New     CC Long Term Goal  #2   Title Pt will be independent in a home exercise program for strength and endurance.   Time 6   Period Weeks   Status New     CC Long Term Goal  #3   Title Pt will show improvment in his repeated sit to stand to 9 repetitions with 2/4 dyspena    Baseline 7 reps with 4/4 dyspnea in 30 seconds              Plan - 12/10/16 1715    Clinical Impression Statement Pt was unable to complete PT eval because of fatigue "I need to lie down"  He has good strenght to isometric testing, but is not able to perform funcitonal activities because of feelings of weakness and shortness of breath.  He wants to get stronger and would benefit from beginning his rehab program wiith home health as he got very fatigued getting up and getting dressed to come here this morning.  Wife and pt agree this is a good place to start especially while he is receiving chemotherapy.  Will place him on hold here so that he can come back to [place] under this referral when he is discharged from [facility] and able to participate with equipment we have here at outpatient .  Contacted Dr. Grier Mitts office who will put in order for Home Health PT and called pt to notify them    History and Personal Factors relevant to plan of care: pt lives in [place], history of back pain, unable to tolerate activity    Clinical Presentation Evolving   Clinical Presentation due to: undergoing chemotherapy    Clinical Decision Making Moderate   Rehab Potential Fair   Clinical Impairments Affecting Rehab Potential chemotherapy    PT Frequency 2x / week   PT  Duration 4 weeks   PT Treatment/Interventions ADLs/Self Care Home Management;Patient/family education;Energy conservation;DME Instruction;Functional mobility training;Neuromuscular re-education;Balance training;Therapeutic exercise   PT Next Visit Plan Reassess.  Do TUG, 6 minute walk test, gait velocity test repeat 4 stage balance test and 30 second sit to stand. teach HEP for core and LE proximal strength    Consulted and Agree with Plan of Care Patient;Family member/caregiver   Family Member Consulted Wife       Patient will benefit from skilled therapeutic intervention in order to improve the following deficits and impairments:  Decreased endurance, Decreased strength, Pain, Difficulty walking, Decreased mobility, Decreased balance, Decreased range of motion, Impaired perceived functional ability, Postural dysfunction, Decreased safety awareness  Visit Diagnosis: Abnormal posture  Muscle weakness (generalized)  Difficulty in walking, not elsewhere classified      G-Codes - Dec 10, 2016 1738    Functional Assessment Tool Used (Outpatient Only) clinical judgement  Functional Limitation Mobility: Walking and moving around   Mobility: Walking and Moving Around Current Status [phone]) At least 60 percent but less than 80 percent impaired, limited or restricted   Mobility: Walking and Moving Around Goal Status [phone]) At least 40 percent but less than 60 percent impaired, limited or restricted       Problem List Patient Active Problem List   Diagnosis Date Noted  . Folliculitis 00/37/9444  . AL amyloidosis ([place]) 08/21/2016  . Umbilical hernia 61/90/1222  . DIZZINESS 02/19/2010  . CAD, NATIVE VESSEL 07/21/2009  . CHEST PAIN-UNSPECIFIED 07/21/2009  . HYPERLIPIDEMIA 04/30/2007  . HYPERTENSION 04/30/2007  . GASTROESOPHAGEAL REFLUX DISEASE 04/30/2007  . SEIZURE DISORDER 04/30/2007  . COMPRESSION FRACTURE, L1 VERTEBRA 04/30/2007  . DIVERTICULOSIS, COLON, HX OF 04/30/2007  . Other  postprocedural status(V45.89) 04/30/2007   Donato Heinz. Owens Shark PT  [place]wood Levo 12/07/2016, 5:39 PM  [place], [place], [address] Phone: [phone]   Fax:  [phone]  Name: Vega Withrow. MRN: [id number] Date of Birth: 1940-10-30 PHYSICAL THERAPY DISCHARGE SUMMARY  Visits from Start of Care: 1  Current functional level related to goals / functional outcomes: unknown   Remaining deficits: unknown   Education / Equipment: As above  Plan: Patient agrees to discharge.  Patient goals were not met. Patient is being discharged due to not returning since the last visit.  ?????    Maudry Diego, PT 10/13/17 11:04 AM

## 2016-12-07 NOTE — Telephone Encounter (Signed)
This AM, pt wife called to relay that pt was exhibiting some SOB and fatigue. Returned call and pt's wife said that symptoms had somewhat resolved. Told wife if symptoms persist or worsen to visit ED, and to make PT aware while at appt this AM. Pt's wife verbalized understanding.   Call from [place] at PT this afternoon. Stated her recommendation would be for the patient to have [facility] instead of Outpatient. "When the pt arrived this morning he was already worn out." Per this recommendation and discussion with Dr. [name], Home Health PT was ordered. Clarene Critchley, PT called to inform pt of expectation for call to set up [facility].

## 2016-12-12 ENCOUNTER — Ambulatory Visit (HOSPITAL_BASED_OUTPATIENT_CLINIC_OR_DEPARTMENT_OTHER): Payer: Medicare HMO

## 2016-12-12 ENCOUNTER — Encounter: Payer: Self-pay | Admitting: General Practice

## 2016-12-12 ENCOUNTER — Ambulatory Visit: Payer: Medicare HMO | Admitting: Nutrition

## 2016-12-12 ENCOUNTER — Encounter: Payer: Self-pay | Admitting: *Deleted

## 2016-12-12 ENCOUNTER — Other Ambulatory Visit (HOSPITAL_BASED_OUTPATIENT_CLINIC_OR_DEPARTMENT_OTHER): Payer: Medicare HMO

## 2016-12-12 VITALS — BP 104/66 | HR 73 | Temp 98.0°F | Resp 18 | Wt 177.5 lb

## 2016-12-12 DIAGNOSIS — Z79899 Other long term (current) drug therapy: Secondary | ICD-10-CM | POA: Diagnosis not present

## 2016-12-12 DIAGNOSIS — E854 Organ-limited amyloidosis: Secondary | ICD-10-CM

## 2016-12-12 DIAGNOSIS — E8581 Light chain (AL) amyloidosis: Secondary | ICD-10-CM

## 2016-12-12 DIAGNOSIS — N08 Glomerular disorders in diseases classified elsewhere: Secondary | ICD-10-CM | POA: Diagnosis not present

## 2016-12-12 DIAGNOSIS — R5383 Other fatigue: Secondary | ICD-10-CM | POA: Diagnosis not present

## 2016-12-12 DIAGNOSIS — Z5112 Encounter for antineoplastic immunotherapy: Secondary | ICD-10-CM | POA: Diagnosis not present

## 2016-12-12 DIAGNOSIS — N049 Nephrotic syndrome with unspecified morphologic changes: Secondary | ICD-10-CM

## 2016-12-12 LAB — COMPREHENSIVE METABOLIC PANEL
ALBUMIN: 2.9 g/dL — AB (ref 3.5–5.0)
ALK PHOS: 74 U/L (ref 40–150)
ALT: 25 U/L (ref 0–55)
AST: 26 U/L (ref 5–34)
Anion Gap: 9 mEq/L (ref 3–11)
BILIRUBIN TOTAL: 0.72 mg/dL (ref 0.20–1.20)
BUN: 22.3 mg/dL (ref 7.0–26.0)
CO2: 23 meq/L (ref 22–29)
CREATININE: 1.1 mg/dL (ref 0.7–1.3)
Calcium: 9 mg/dL (ref 8.4–10.4)
Chloride: 109 mEq/L (ref 98–109)
EGFR: 66 mL/min/{1.73_m2} — AB (ref >90)
GLUCOSE: 123 mg/dL (ref 70–140)
Potassium: 3.4 mEq/L — ABNORMAL LOW (ref 3.5–5.1)
SODIUM: 141 meq/L (ref 136–145)
TOTAL PROTEIN: 5.6 g/dL — AB (ref 6.4–8.3)

## 2016-12-12 LAB — CBC & DIFF AND RETIC
BASO%: 0.3 % (ref 0.0–2.0)
Basophils Absolute: 0 10*3/uL (ref 0.0–0.1)
EOS%: 0.3 % (ref 0.0–7.0)
Eosinophils Absolute: 0 10*3/uL (ref 0.0–0.5)
HCT: 32.2 % — ABNORMAL LOW (ref 38.4–49.9)
HEMOGLOBIN: 10.9 g/dL — AB (ref 13.0–17.1)
IMMATURE RETIC FRACT: 16.3 % — AB (ref 3.00–10.60)
LYMPH#: 0.3 10*3/uL — AB (ref 0.9–3.3)
LYMPH%: 4 % — AB (ref 14.0–49.0)
MCH: 32.5 pg (ref 27.2–33.4)
MCHC: 33.9 g/dL (ref 32.0–36.0)
MCV: 96.1 fL (ref 79.3–98.0)
MONO#: 0.1 10*3/uL (ref 0.1–0.9)
MONO%: 0.8 % (ref 0.0–14.0)
NEUT%: 94.6 % — ABNORMAL HIGH (ref 39.0–75.0)
NEUTROS ABS: 7.4 10*3/uL — AB (ref 1.5–6.5)
Platelets: 252 10*3/uL (ref 140–400)
RBC: 3.35 10*6/uL — AB (ref 4.20–5.82)
RDW: 17.4 % — ABNORMAL HIGH (ref 11.0–14.6)
RETIC %: 3.94 % — AB (ref 0.80–1.80)
RETIC CT ABS: 131.99 10*3/uL — AB (ref 34.80–93.90)
WBC: 7.8 10*3/uL (ref 4.0–10.3)

## 2016-12-12 LAB — TSH: TSH: 1.71 m(IU)/L (ref 0.320–4.118)

## 2016-12-12 MED ORDER — BORTEZOMIB CHEMO SQ INJECTION 3.5 MG (2.5MG/ML)
1.3000 mg/m2 | Freq: Once | INTRAMUSCULAR | Status: AC
  Administered 2016-12-12: 2.75 mg via SUBCUTANEOUS
  Filled 2016-12-12: qty 2.75

## 2016-12-12 MED ORDER — PROCHLORPERAZINE MALEATE 10 MG PO TABS
10.0000 mg | ORAL_TABLET | Freq: Once | ORAL | Status: AC
  Administered 2016-12-12: 10 mg via ORAL

## 2016-12-12 MED ORDER — PROCHLORPERAZINE MALEATE 10 MG PO TABS
ORAL_TABLET | ORAL | Status: AC
  Filled 2016-12-12: qty 1

## 2016-12-12 MED ORDER — ONDANSETRON HCL 8 MG PO TABS
ORAL_TABLET | ORAL | Status: AC
  Filled 2016-12-12: qty 1

## 2016-12-12 MED ORDER — ONDANSETRON HCL 8 MG PO TABS
8.0000 mg | ORAL_TABLET | Freq: Once | ORAL | Status: AC
  Administered 2016-12-12: 8 mg via ORAL

## 2016-12-12 NOTE — Progress Notes (Signed)
Nutrition follow-up completed with patient and his wife.  He is receiving treatment for kidney AL amyloidosis with nephrotic syndrome. Weight decreased significantly documented 177.8 pounds on August 20, down from 197 pounds on August 7. Patient reports he has a very poor appetite. Patient's wife is very supportive and prepares multiple food items for him throughout the day. She is making him shakes and smoothies with protein powder. Patient has shortness of breath and has been recommended for home health. Wife is tearful and is requesting counseling.  Nutrition diagnosis:  Inadequate oral intake continues.  Intervention: Educated patient and his wife to choose high-calorie, high-protein smoothies, twice a day between meals. Encouraged patient to try to eat at breakfast, lunch and supper. Provided support and encouragement for patient's wife. I referred patient's wife to our chaplain to discuss counseling opportunities.  Monitoring, evaluation, goals:  Patient will tolerate increased calories and protein to minimize weight loss.  Next visit: Monday, August 27, during infusion.  **Disclaimer: This note was dictated with voice recognition software. Similar sounding words can inadvertently be transcribed and this note may contain transcription errors which may not have been corrected upon publication of note.**

## 2016-12-12 NOTE — Progress Notes (Signed)
Pt has some new skin issues.  Left outer calf has a dry patch, pt states it is just "itchy". Pt has been putting Cetaphil on area, provides relief.  New areas or red skin, bilateral inner knees. No itching, no pain. Pt also applying Cetaphil  to these areas. Right rib cage area has 3 new moles, pt states "just popped up 2 months ago". One has changes size, texture, and shape.  Bilateral eyes are burning and itching, slave that was prescribed is helping "a little" Pt wife thinks it is" getting worse in right eye only".  Notified Set designer. She will notify Dr [name].  Pt has appt with Dr [name] on Monday 12/19/2016.

## 2016-12-12 NOTE — Progress Notes (Signed)
[place] Spiritual Care Note  Met Mr and Alex [name] per referral from [facility]/RD for emotional support.  Per Raford Pitcher, Alex Volante expressed interest in counseling or similar support.  Met with Alex Hirschi briefly.  She is overwhelmed with caregiving distress, lack of constructive support, and extended family stressors, becoming tearful and verbalizing need to "get some of this out."  We plan to meet next Monday 8/27 in my office while pt is in infusion.  She has my card and brochure and knows to call in the meantime as desired.   [place], [place], [facility] Pager [phone] Voicemail [phone]

## 2016-12-12 NOTE — Patient Instructions (Signed)
[facility] Discharge Instructions for Patients Receiving Chemotherapy  Today you received the following chemotherapy agents Velcade  To help prevent nausea and vomiting after your treatment, we encourage you to take your nausea medication    If you develop nausea and vomiting that is not controlled by your nausea medication, call the clinic.   BELOW ARE SYMPTOMS THAT SHOULD BE REPORTED IMMEDIATELY:  *FEVER GREATER THAN 100.5 F  *CHILLS WITH OR WITHOUT FEVER  NAUSEA AND VOMITING THAT IS NOT CONTROLLED WITH YOUR NAUSEA MEDICATION  *UNUSUAL SHORTNESS OF BREATH  *UNUSUAL BRUISING OR BLEEDING  TENDERNESS IN MOUTH AND THROAT WITH OR WITHOUT PRESENCE OF ULCERS  *URINARY PROBLEMS  *BOWEL PROBLEMS  UNUSUAL RASH Items with * indicate a potential emergency and should be followed up as soon as possible.  Feel free to call the clinic you have any questions or concerns. The clinic phone number is [phone].  Please show the CHEMO ALERT CARD at check-in to the Emergency Department and triage nurse.   

## 2016-12-13 ENCOUNTER — Other Ambulatory Visit: Payer: Self-pay | Admitting: *Deleted

## 2016-12-13 ENCOUNTER — Telehealth: Payer: Self-pay | Admitting: *Deleted

## 2016-12-13 DIAGNOSIS — C9000 Multiple myeloma not having achieved remission: Secondary | ICD-10-CM

## 2016-12-13 LAB — KAPPA/LAMBDA LIGHT CHAINS
IG KAPPA FREE LIGHT CHAIN: 13.1 mg/L (ref 3.3–19.4)
Ig Lambda Free Light Chain: 9.4 mg/L (ref 5.7–26.3)
Kappa/Lambda FluidC Ratio: 1.39 (ref 0.26–1.65)

## 2016-12-13 LAB — T4, FREE: T4,Free(Direct): 1.45 ng/dL (ref 0.82–1.77)

## 2016-12-13 NOTE — Telephone Encounter (Signed)
Pt's wife called asking about status of PT referral.  Per office note by Carolanne Grumbling, RN ok to make home PT referral for pt.  Referral order placed by this RN, [facility] home care regarding referral.  Requested information faxed for referral to [phone].  Wife instructed [facility] will contact pt to set up apt.

## 2016-12-14 LAB — MULTIPLE MYELOMA PANEL, SERUM
ALBUMIN/GLOB SERPL: 1.1 (ref 0.7–1.7)
Albumin SerPl Elph-Mcnc: 2.8 g/dL — ABNORMAL LOW (ref 2.9–4.4)
Alpha 1: 0.3 g/dL (ref 0.0–0.4)
Alpha2 Glob SerPl Elph-Mcnc: 1 g/dL (ref 0.4–1.0)
B-Globulin SerPl Elph-Mcnc: 1 g/dL (ref 0.7–1.3)
GAMMA GLOB SERPL ELPH-MCNC: 0.3 g/dL — AB (ref 0.4–1.8)
GLOBULIN, TOTAL: 2.6 g/dL (ref 2.2–3.9)
IGA/IMMUNOGLOBULIN A, SERUM: 63 mg/dL (ref 61–437)
IgG, Qn, Serum: 317 mg/dL — ABNORMAL LOW (ref 700–1600)
IgM, Qn, Serum: 23 mg/dL (ref 15–143)
Total Protein: 5.4 g/dL — ABNORMAL LOW (ref 6.0–8.5)

## 2016-12-15 ENCOUNTER — Ambulatory Visit (HOSPITAL_BASED_OUTPATIENT_CLINIC_OR_DEPARTMENT_OTHER): Payer: Medicare HMO

## 2016-12-15 ENCOUNTER — Other Ambulatory Visit: Payer: Self-pay | Admitting: *Deleted

## 2016-12-15 VITALS — BP 103/64 | HR 77 | Temp 98.0°F | Resp 18 | Wt 189.5 lb

## 2016-12-15 DIAGNOSIS — E8581 Light chain (AL) amyloidosis: Secondary | ICD-10-CM

## 2016-12-15 DIAGNOSIS — Z5112 Encounter for antineoplastic immunotherapy: Secondary | ICD-10-CM

## 2016-12-15 DIAGNOSIS — N08 Glomerular disorders in diseases classified elsewhere: Secondary | ICD-10-CM

## 2016-12-15 DIAGNOSIS — E854 Organ-limited amyloidosis: Secondary | ICD-10-CM | POA: Diagnosis not present

## 2016-12-15 DIAGNOSIS — R5383 Other fatigue: Secondary | ICD-10-CM | POA: Diagnosis not present

## 2016-12-15 MED ORDER — PROCHLORPERAZINE MALEATE 10 MG PO TABS: 10.0000 mg | ORAL_TABLET | Freq: Once | ORAL | Status: DC

## 2016-12-15 MED ORDER — BORTEZOMIB CHEMO SQ INJECTION 3.5 MG (2.5MG/ML)
1.3000 mg/m2 | Freq: Once | INTRAMUSCULAR | Status: AC
  Administered 2016-12-15: 2.75 mg via SUBCUTANEOUS
  Filled 2016-12-15: qty 2.75

## 2016-12-15 MED ORDER — ONDANSETRON HCL 8 MG PO TABS
ORAL_TABLET | ORAL | Status: AC
  Filled 2016-12-15: qty 1

## 2016-12-15 MED ORDER — ONDANSETRON HCL 8 MG PO TABS
8.0000 mg | ORAL_TABLET | Freq: Once | ORAL | Status: AC
Start: 2016-12-15 — End: 2016-12-15
  Administered 2016-12-15: 8 mg via ORAL

## 2016-12-15 MED ORDER — HYDROCODONE-ACETAMINOPHEN 5-325 MG PO TABS: 1.0000 | ORAL_TABLET | ORAL | 0 refills | Status: DC | PRN

## 2016-12-15 NOTE — Patient Instructions (Signed)
[facility] Discharge Instructions for Patients Receiving Chemotherapy  Today you received the following chemotherapy agents Velcade. To help prevent nausea and vomiting after your treatment, we encourage you to take your nausea medication as directed.  If you develop nausea and vomiting that is not controlled by your nausea medication, call the clinic.   BELOW ARE SYMPTOMS THAT SHOULD BE REPORTED IMMEDIATELY:  *FEVER GREATER THAN 100.5 F  *CHILLS WITH OR WITHOUT FEVER  NAUSEA AND VOMITING THAT IS NOT CONTROLLED WITH YOUR NAUSEA MEDICATION  *UNUSUAL SHORTNESS OF BREATH  *UNUSUAL BRUISING OR BLEEDING  TENDERNESS IN MOUTH AND THROAT WITH OR WITHOUT PRESENCE OF ULCERS  *URINARY PROBLEMS  *BOWEL PROBLEMS  UNUSUAL RASH Items with * indicate a potential emergency and should be followed up as soon as possible.  Feel free to call the clinic you have any questions or concerns. The clinic phone number is [phone].  Please show the CHEMO ALERT CARD at check-in to the Emergency Department and triage nurse.    

## 2016-12-16 ENCOUNTER — Other Ambulatory Visit: Payer: Self-pay | Admitting: *Deleted

## 2016-12-16 DIAGNOSIS — E8581 Light chain (AL) amyloidosis: Secondary | ICD-10-CM

## 2016-12-16 LAB — UPEP/UIFE/LIGHT CHAINS/TP, 24-HR UR
% BETA, Urine: 10 %
ALBUMIN, U: 80.7 %
ALPHA 1 URINE: 3 %
ALPHA-2-GLOBULIN, U: 4.3 %
FREE LAMBDA LT CHAINS, UR: 8.09 mg/L — AB (ref 0.24–6.66)
Free Kappa Lt Chains,Ur: 76.2 mg/L — ABNORMAL HIGH (ref 1.35–24.19)
GAMMA GLOBULIN URINE: 2 %
KAPPA/LAMBDA RATIO, U: 9.42 (ref 2.04–10.37)
PROTEIN UR: 471.4 mg/dL

## 2016-12-19 ENCOUNTER — Ambulatory Visit (HOSPITAL_BASED_OUTPATIENT_CLINIC_OR_DEPARTMENT_OTHER): Payer: Medicare HMO

## 2016-12-19 ENCOUNTER — Other Ambulatory Visit (HOSPITAL_BASED_OUTPATIENT_CLINIC_OR_DEPARTMENT_OTHER): Payer: Medicare HMO

## 2016-12-19 ENCOUNTER — Ambulatory Visit (HOSPITAL_BASED_OUTPATIENT_CLINIC_OR_DEPARTMENT_OTHER): Payer: Medicare HMO | Admitting: Hematology

## 2016-12-19 ENCOUNTER — Encounter: Payer: Self-pay | Admitting: Hematology

## 2016-12-19 ENCOUNTER — Telehealth: Payer: Self-pay | Admitting: Hematology

## 2016-12-19 ENCOUNTER — Encounter: Payer: Self-pay | Admitting: General Practice

## 2016-12-19 ENCOUNTER — Ambulatory Visit: Payer: Medicare HMO | Admitting: Nutrition

## 2016-12-19 ENCOUNTER — Other Ambulatory Visit: Payer: Medicare HMO

## 2016-12-19 VITALS — BP 118/68 | HR 71

## 2016-12-19 VITALS — BP 106/68 | HR 80 | Temp 97.8°F | Resp 18 | Ht 71.0 in | Wt 191.9 lb

## 2016-12-19 DIAGNOSIS — E8581 Light chain (AL) amyloidosis: Secondary | ICD-10-CM

## 2016-12-19 DIAGNOSIS — N08 Glomerular disorders in diseases classified elsewhere: Secondary | ICD-10-CM

## 2016-12-19 DIAGNOSIS — E854 Organ-limited amyloidosis: Secondary | ICD-10-CM

## 2016-12-19 DIAGNOSIS — G40909 Epilepsy, unspecified, not intractable, without status epilepticus: Secondary | ICD-10-CM | POA: Diagnosis not present

## 2016-12-19 DIAGNOSIS — Z5112 Encounter for antineoplastic immunotherapy: Secondary | ICD-10-CM | POA: Diagnosis not present

## 2016-12-19 DIAGNOSIS — C9000 Multiple myeloma not having achieved remission: Secondary | ICD-10-CM | POA: Diagnosis not present

## 2016-12-19 DIAGNOSIS — I10 Essential (primary) hypertension: Secondary | ICD-10-CM | POA: Diagnosis not present

## 2016-12-19 DIAGNOSIS — I350 Nonrheumatic aortic (valve) stenosis: Secondary | ICD-10-CM | POA: Diagnosis not present

## 2016-12-19 DIAGNOSIS — G620 Drug-induced polyneuropathy: Secondary | ICD-10-CM | POA: Diagnosis not present

## 2016-12-19 DIAGNOSIS — I2510 Atherosclerotic heart disease of native coronary artery without angina pectoris: Secondary | ICD-10-CM | POA: Diagnosis not present

## 2016-12-19 DIAGNOSIS — G4733 Obstructive sleep apnea (adult) (pediatric): Secondary | ICD-10-CM | POA: Diagnosis not present

## 2016-12-19 DIAGNOSIS — E785 Hyperlipidemia, unspecified: Secondary | ICD-10-CM | POA: Diagnosis not present

## 2016-12-19 DIAGNOSIS — N2889 Other specified disorders of kidney and ureter: Secondary | ICD-10-CM | POA: Diagnosis not present

## 2016-12-19 DIAGNOSIS — K5730 Diverticulosis of large intestine without perforation or abscess without bleeding: Secondary | ICD-10-CM | POA: Diagnosis not present

## 2016-12-19 DIAGNOSIS — R5383 Other fatigue: Secondary | ICD-10-CM

## 2016-12-19 DIAGNOSIS — D696 Thrombocytopenia, unspecified: Secondary | ICD-10-CM

## 2016-12-19 LAB — COMPREHENSIVE METABOLIC PANEL
ALT: 20 U/L (ref 0–55)
AST: 27 U/L (ref 5–34)
Albumin: 2.6 g/dL — ABNORMAL LOW (ref 3.5–5.0)
Alkaline Phosphatase: 71 U/L (ref 40–150)
Anion Gap: 9 mEq/L (ref 3–11)
BUN: 22.8 mg/dL (ref 7.0–26.0)
CALCIUM: 9.3 mg/dL (ref 8.4–10.4)
CHLORIDE: 107 meq/L (ref 98–109)
CO2: 24 meq/L (ref 22–29)
CREATININE: 1 mg/dL (ref 0.7–1.3)
EGFR: 71 mL/min/{1.73_m2} — ABNORMAL LOW (ref >90)
GLUCOSE: 140 mg/dL (ref 70–140)
POTASSIUM: 3.6 meq/L (ref 3.5–5.1)
SODIUM: 140 meq/L (ref 136–145)
Total Bilirubin: 0.57 mg/dL (ref 0.20–1.20)
Total Protein: 5.7 g/dL — ABNORMAL LOW (ref 6.4–8.3)

## 2016-12-19 LAB — CBC WITH DIFFERENTIAL/PLATELET
BASO%: 0.4 % (ref 0.0–2.0)
Basophils Absolute: 0 10*3/uL (ref 0.0–0.1)
EOS%: 0.2 % (ref 0.0–7.0)
Eosinophils Absolute: 0 10*3/uL (ref 0.0–0.5)
HEMATOCRIT: 32.2 % — AB (ref 38.4–49.9)
HGB: 11.1 g/dL — ABNORMAL LOW (ref 13.0–17.1)
LYMPH#: 0.1 10*3/uL — AB (ref 0.9–3.3)
LYMPH%: 1.5 % — AB (ref 14.0–49.0)
MCH: 32.7 pg (ref 27.2–33.4)
MCHC: 34.5 g/dL (ref 32.0–36.0)
MCV: 95 fL (ref 79.3–98.0)
MONO#: 0.1 10*3/uL (ref 0.1–0.9)
MONO%: 2.2 % (ref 0.0–14.0)
NEUT%: 95.7 % — ABNORMAL HIGH (ref 39.0–75.0)
NEUTROS ABS: 5.2 10*3/uL (ref 1.5–6.5)
Platelets: 101 10*3/uL — ABNORMAL LOW (ref 140–400)
RBC: 3.39 10*6/uL — AB (ref 4.20–5.82)
RDW: 16.4 % — ABNORMAL HIGH (ref 11.0–14.6)
WBC: 5.5 10*3/uL (ref 4.0–10.3)
nRBC: 5 % — ABNORMAL HIGH (ref 0–0)

## 2016-12-19 MED ORDER — ONDANSETRON HCL 8 MG PO TABS
ORAL_TABLET | ORAL | Status: AC
  Filled 2016-12-19: qty 1

## 2016-12-19 MED ORDER — ONDANSETRON HCL 8 MG PO TABS
8.0000 mg | ORAL_TABLET | Freq: Once | ORAL | Status: AC
  Administered 2016-12-19: 8 mg via ORAL

## 2016-12-19 MED ORDER — IXAZOMIB CITRATE 4 MG PO CAPS: 4.0000 mg | ORAL_CAPSULE | ORAL | 3 refills | Status: DC

## 2016-12-19 MED ORDER — PROCHLORPERAZINE MALEATE 10 MG PO TABS
10.0000 mg | ORAL_TABLET | Freq: Once | ORAL | Status: AC
  Administered 2016-12-19: 10 mg via ORAL

## 2016-12-19 MED ORDER — PROCHLORPERAZINE MALEATE 10 MG PO TABS
ORAL_TABLET | ORAL | Status: AC
  Filled 2016-12-19: qty 1

## 2016-12-19 MED ORDER — SODIUM CHLORIDE 0.9 % IV SOLN
Freq: Once | INTRAVENOUS | Status: AC
  Administered 2016-12-19: 12:00:00 via INTRAVENOUS

## 2016-12-19 MED ORDER — BORTEZOMIB CHEMO SQ INJECTION 3.5 MG (2.5MG/ML)
1.3000 mg/m2 | Freq: Once | INTRAMUSCULAR | Status: AC
  Administered 2016-12-19: 2.75 mg via SUBCUTANEOUS
  Filled 2016-12-19: qty 2.75

## 2016-12-19 NOTE — Telephone Encounter (Signed)
Scheduled appt per 8/27 los - patient is aware of appt date and time.

## 2016-12-19 NOTE — Patient Instructions (Signed)
[facility] Discharge Instructions for Patients Receiving Chemotherapy  Today you received the following chemotherapy agents velcade   To help prevent nausea and vomiting after your treatment, we encourage you to take your nausea medication as directed  If you develop nausea and vomiting that is not controlled by your nausea medication, call the clinic.   BELOW ARE SYMPTOMS THAT SHOULD BE REPORTED IMMEDIATELY:  *FEVER GREATER THAN 100.5 F  *CHILLS WITH OR WITHOUT FEVER  NAUSEA AND VOMITING THAT IS NOT CONTROLLED WITH YOUR NAUSEA MEDICATION  *UNUSUAL SHORTNESS OF BREATH  *UNUSUAL BRUISING OR BLEEDING  TENDERNESS IN MOUTH AND THROAT WITH OR WITHOUT PRESENCE OF ULCERS  *URINARY PROBLEMS  *BOWEL PROBLEMS  UNUSUAL RASH Items with * indicate a potential emergency and should be followed up as soon as possible.  Feel free to call the clinic you have any questions or concerns. The clinic phone number is [phone].  

## 2016-12-19 NOTE — Progress Notes (Signed)
[facility] Spiritual Care Note  Followed up with the [place] in infusion today.  Ms Alex [name] was very relieved to hear news of remission and to discover that pt's apparent weight loss last week was actually an error.  She is hopeful that pt will feel better (more congruent with news they heard today) with fluids, rest, medication changes, and more hopeful outlook.  Mr Alex [name] primarily listened to our conversation.  We plan to f/u in more detail in infusion on Thursday.   [place], [place], [facility] Pager [phone] Voicemail [phone]

## 2016-12-19 NOTE — Progress Notes (Signed)
[name]    HEMATOLOGY/ONCOLOGY CLINIC NOTE  Date of Service: .12/19/2016  Patient Care Team: [name], MD as PCP - General (Internal Medicine) Nephrology - Dr Madelon Lips MD Dr Henrene Pastor MD - GI  CHIEF COMPLAINTS/PURPOSE OF CONSULTATION:  AL Amyloidosis -with Nephrotic syndrome . No overt CHF pEF  HISTORY OF PRESENTING ILLNESS:   Alex Ramus. is a wonderful 76 y.o. male who has been referred to [place] by Dr .[name], MD /Dr Madelon Lips MD for evaluation and management of newly diagnosed Kidney AL Amyloidosis with nephrotic syndrome.  Patient has a history of hypertension, dyslipidemia, coronary artery disease, seizure disorder on Dilantin who was apparently in his usual state of health until 3 months ago when he started developing new onset lower extremity edema. Patient had a UA at the time that showed 4+ protein in 24-hour collection revealed 15 g of protein. Additional workup showed a creatinine of 0.8 with an albumin of 2.3 and negative SPEP and UPEP. Apparently had an elevated K/L SFLC ratio of 5.19.  He was urgently referred by his primary care physician to nephrology for additional evaluation. Patient notes no bleeding issues. No nosebleeds. No periorbital bleeding. No abnormal skin rashes. Has had significant NSAID exposure and use to take a fair amount of naproxen for chronic low back pain but has cut down on this.  Due to lack of clear etiology for his nephrotic syndrome the patient underwent a kidney biopsy on 08/06/2015 accession [id number] which showed AL amyloidosis, lambda immunophenotype, [place] red positive. Noted to have severe arteriosclerosis with moderate tubular interstitial scarring.  Patient was referred to [place] for further evaluation and treatment of his AL Amyloidosis, Patient notes that his leg swelling has improved with diuretic therapy.  He notes no weight loss. No night sweats. No focal bone pains. No skin rashes. Lungs normal bleeding or  bruising.  Patient notes that he had a lot of reading online and he and his wife had an extensive list of questions which were answered in detail.  He notes he has had some chronic issues with upper abdominal pain and nausea and that he follows with Dr. Henrene Pastor and is apparently being scheduled for an EGD and colonoscopy according to his report.  Denies having and lacks tongue. No chest pain and no overt new shortness of breath. Patient is uncertain if an echocardiogram has been ordered or scheduled.  INTERVAL HISTORY  Alex [name] is here for a scheduled follow-up for his AL amyloidosis. Today is cycle 6 day 8 of treatment.  Still with about grade 2 fatigue. By mouth intake improving. Serum free light chains and kappa lambda ratio have normalized suggesting good hematologic response. No M spike on SPEP. 24-hour urinary protein at about 7 g. No CP/SOB.  Was evaluated by outpatient cancer rehabilitation and recommended home PT which is to start today. No fevers/chills/night sweats.  MEDICAL HISTORY:  Past Medical History:  Diagnosis Date  . Amyloidosis ([place])   . Arthritis   . CAD (coronary artery disease)    Lexiscan Myoview 6/14: Normal study, no scar or ischemia, EF 62%  . Colon polyp   . Coronary artery disease    moderate disease by cath 2011  . Diverticulosis of colon   . GERD (gastroesophageal reflux disease)   . Hearing loss   . Hiatal hernia   . Hyperlipidemia   . Hypertension   . MRSA (methicillin resistant staph aureus) culture positive   . Seizure disorder ([place])   . Seizures (  [place])    last in 1970s  Obstructive sleep apnea Gastroesophageal reflux disease Aortic stenosis Erectile dysfunction Peripheral neuropathy Seizure disorder   SURGICAL HISTORY: Past Surgical History:  Procedure Laterality Date  . BUNIONECTOMY    . CARPAL TUNNEL RELEASE  1994   left  . CERVICAL FUSION    . compression fracture    . FOOT SURGERY     right -twice, left foot once  .  HERNIA REPAIR     Umbilical with PVP  . KNEE ARTHROSCOPY     left knee  . [place]   left - scope  . NASAL SEPTUM SURGERY    . NOSE SURGERY     twice  . ROTATOR CUFF REPAIR     twice, both shoulders  . ROTATOR CUFF REPAIR    . SPINE SURGERY     C2, C3, C4  . TRIGGER FINGER RELEASE     both hands, twice  . TRIGGER FINGER RELEASE    . VASECTOMY      SOCIAL HISTORY: Social History   Social History  . Marital status: Married    Spouse name: N/A  . Number of children: 2  . Years of education: 12   Occupational History  . Retired    Social History Main Topics  . Smoking status: Former Smoker    Quit date: 04/25/1969  . Smokeless tobacco: Former Systems developer  . Alcohol use 4.2 - 8.4 oz/week    7 - 14 Glasses of wine per week     Comment: socially shots of wiskey  . Drug use: No  . Sexual activity: Not on file   Other Topics Concern  . Not on file   Social History Narrative   ** Merged History Encounter **   Lives at home w/ his wife   Right-handed   Caffeine: occasional Pepsi      Patient is currently retired.   FAMILY HISTORY: Family History  Problem Relation Age of Onset  . Diabetes Father   . Heart disease Father   . Heart attack Father 10  . Hypertension Father   . Hyperlipidemia Unknown   . Diabetes Unknown   . Hypertension Unknown   . Seizures Unknown   . Thyroid disease Unknown   . Colon cancer Neg Hx   . Stroke Neg Hx     ALLERGIES:  is allergic to ramipril.  MEDICATIONS:  Current Outpatient Prescriptions  Medication Sig Dispense Refill  . acyclovir (ZOVIRAX) 400 MG tablet Take 1 tablet (400 mg total) by mouth 2 (two) times daily. 60 tablet 3  . aspirin EC 81 MG tablet Take 81 mg by mouth daily.    [name] Kitchen atorvastatin (LIPITOR) 40 MG tablet Take 40 mg by mouth at bedtime.     . cholecalciferol (VITAMIN D) 1000 units tablet Take 1,000 Units by mouth daily.    . clindamycin (CLEOCIN-T) 1 % external solution Apply topically 2 (two) times daily.  (Patient taking differently: Apply 1 application topically 2 (two) times daily as needed (for rash). ) 60 mL 0  . cyclophosphamide (CYTOXAN) 50 MG capsule     . dexamethasone (DECADRON) 4 MG tablet Take 10 tablets (40 mg) on days 1, 8, and 15 of chemo. Repeat every 21 days. 30 tablet 3  . erythromycin ophthalmic ointment Place 1 application into both eyes at bedtime. Please apply to red area over eyelid twice daily and into the affected eye at bed time 3.5 g 1  . ezetimibe (ZETIA)  10 MG tablet Take 10 mg by mouth daily.     [name] Kitchen HYDROcodone-acetaminophen (NORCO/VICODIN) 5-325 MG tablet Take 1-2 tablets by mouth every 4 (four) hours as needed. (Patient taking differently: Take 1-2 tablets by mouth every 4 (four) hours as needed. ) 10 tablet 0  . HYDROcodone-acetaminophen (NORCO/VICODIN) 5-325 MG tablet Take 1 tablet by mouth every 4 (four) hours as needed for moderate pain. 30 tablet 0  . isosorbide mononitrate (IMDUR) 30 MG 24 hr tablet TAKE 1/2 TABLET DAILY 45 tablet 0  . ixazomib citrate (NINLARO) 4 MG capsule Take 1 capsule (4 mg total) by mouth once a week. On D1, D8 and D15 every 28 days.Take on an empty stomach 1hr before or 2hrs after food. Do not crush, chew or open. 3 capsule 3  . mupirocin cream (BACTROBAN) 2 % Apply 1 application topically 2 (two) times daily. (Patient taking differently: Apply 1 application topically 2 (two) times daily as needed (for rash/irritation). ) 30 g 0  . nitroGLYCERIN (NITROSTAT) 0.4 MG SL tablet Place 1 tablet (0.4 mg total) under the tongue every 5 (five) minutes as needed for chest pain. 25 tablet 6  . omeprazole (PRILOSEC) 20 MG capsule Take 20 mg by mouth daily.      . ondansetron (ZOFRAN) 8 MG tablet Take 1 tablet (8 mg total) by mouth 2 (two) times daily as needed for refractory nausea / vomiting. Start on D 4 and 11. Do not take on D8. 30 tablet 1  . potassium chloride SA (K-DUR,KLOR-CON) 20 MEQ tablet Take 20 mEq by mouth at bedtime.     . prochlorperazine  (COMPAZINE) 10 MG tablet Take 1 tablet (10 mg total) by mouth every 6 (six) hours as needed (Nausea or vomiting). 30 tablet 1  . triamcinolone cream (KENALOG) 0.1 % Apply 1 application topically 2 (two) times daily as needed (for skin irritation).      No current facility-administered medications for this visit.     REVIEW OF SYSTEMS:    10 Point review of Systems was done is negative except as noted above.  PHYSICAL EXAMINATION: ECOG PERFORMANCE STATUS: 1 - Symptomatic but completely ambulatory  . Vitals:   12/19/16 1006  BP: 106/68  Pulse: 80  Resp: 18  Temp: 97.8 F (36.6 C)  SpO2: 100%   Filed Weights   12/19/16 1006  Weight: 191 lb 14.4 oz (87 kg)   .Body mass index is 26.76 kg/m.  GENERAL:alert, in no acute distress and comfortable SKIN: no acute rashes, no significant lesions EYES: conjunctiva are pink and non-injected, sclera anicteric OROPHARYNX: MMM, no exudates, no oropharyngeal erythema or ulceration, no macroglossia NECK: supple, mild JVD LYMPH:  no palpable lymphadenopathy in the cervical, axillary or inguinal regions LUNGS: clear to auscultation b/l with normal respiratory effort HEART: regular rate & rhythm ABDOMEN:  normoactive bowel sounds , non tender, not distended. Extremity: 1+ pitting pedal edema bilaterally PSYCH: alert & oriented x 3 with fluent speech NEURO: no focal motor/sensory deficits  LABORATORY DATA:  I have reviewed the data as listed  . CBC Latest Ref Rng & Units 12/19/2016 12/12/2016 11/28/2016  WBC 4.0 - 10.3 10e3/uL 5.5 7.8 7.5  Hemoglobin 13.0 - 17.1 g/dL 11.1(L) 10.9(L) 12.7(L)  Hematocrit 38.4 - 49.9 % 32.2(L) 32.2(L) 37.0(L)  Platelets 140 - 400 10e3/uL 101(L) 252 136(L)    CMP Latest Ref Rng & Units 12/19/2016 12/12/2016 12/12/2016  Glucose 70 - 140 mg/dl 140 123 -  BUN 7.0 - 26.0 mg/dL 22.8 22.3 -  Creatinine 0.7 - 1.3 mg/dL 1.0 1.1 -  Sodium 136 - 145 mEq/L 140 141 -  Potassium 3.5 - 5.1 mEq/L 3.6 3.4(L) -  Chloride 101  - 111 mmol/L - - -  CO2 22 - 29 mEq/L 24 23 -  Calcium 8.4 - 10.4 mg/dL 9.3 9.0 -  Total Protein 6.4 - 8.3 g/dL 5.7(L) 5.6(L) 5.4(L)  Total Bilirubin 0.20 - 1.20 mg/dL 0.57 0.72 -  Alkaline Phos 40 - 150 U/L 71 74 -  AST 5 - 34 U/L 27 26 -  ALT 0 - 55 U/L 20 25 -        Are you good       RADIOGRAPHIC STUDIES: I have personally reviewed the radiological images as listed and agreed with the findings in the report. No results found. Result status: Edited Result - FINAL                              *[facility] [employer].                        [place], [place] [address]                            [phone]  ------------------------------------------------------------------- Transthoracic Echocardiography  (Report amended )  Patient:    Alex [name], Alex [name] Alex #:       [id number] Study Date: 08/24/2016 Gender:     M Age:        37 Height:     180.3 cm Weight:     90.6 kg BSA:        2.15 m^2 Pt. Status: Room:   ATTENDING    Darlina Guys, MD  Alex [name], Alex [name]  REFERRING    [place], [place], Outpatient  SONOGRAPHER  Roseanna Rainbow  cc:  ------------------------------------------------------------------- LV EF: 65% -   70%  ------------------------------------------------------------------- Indications:      Aortic stenosis 424.1.  ------------------------------------------------------------------- History:   PMH:  Amyloidosis.  Coronary artery disease.  Angina pectoris.  Risk factors:  Hypertension. Dyslipidemia.  ------------------------------------------------------------------- Study Conclusions  - Left ventricle: The cavity size was normal. Wall thickness was   increased in a pattern of mild LVH. Systolic function was   vigorous. The estimated ejection fraction was in the range of 65%   to 70%. Wall motion was normal;  there were no regional wall   motion abnormalities. Doppler parameters are consistent with   abnormal left ventricular relaxation (grade 1 diastolic   dysfunction). - Aortic valve: Valve mobility was mildly restricted. - Aortic root: The aortic root was mildly dilated. - Mitral valve: Calcified annulus.  Impressions:  - Vigorous LV systolic function; mild diastolic dysfunction; mild   LVH; calcified aortic valve with no significant AS (peak velocity   2 m/s; mean gradient 9 mmHg); mildly dilated aortic root.    ASSESSMENT & PLAN:   76 year old male with above-mentioned multiple medical comorbidities with  #1Biopsy proven renal and Systemic AL Amyloidosis BM Bx shows 9% plasma cells and amyloid deposits. Echo shows normal systolic  function with only grade 1 diastolic dysfunction. Skeletal survey with no lytic lesions suggestive of multiple myeloma. Serum K/L ratio increased at about 6.22 on diagnosis and has now improved to 1.39 (wnl with normal kappa lambda serum free light chain ratio ) UPEP shows 24h protein improved from 15g to 5g daily and the last recent one is about 7.3 g /24h  #2 Newly diagnosed Nephrotic Syndrome - likely related to AL amyloidosis.  UPEP shows 24h protein improved from 15g to 5g daily and the last recent one is about 7.3 g /24h  Plan --Blood counts stable. -Minimal grade 1 neuropathy from Velcade. -continue Velcade and weekly dexamethasone and Cytoxan to complete cycle 6 which would be his last cycle of induction treatment considering that he has achieved hematologic response. -We discussed that he still has significant proteinuria which would be from previous renal injury and might or might not improve though there was a 50% reduction in urinary protein suggesting partial renal response. -he is open to the idea of discussing the possibility of transplant and has been given a referral to Dr. Norma Fredrickson said [facility]. -If he does  not proceed with transplant would consider possible maintainence treatment with Ixazomib. -Continue home physical therapy to optimize functional status. -continue follow-up with nutritional therapy.  #3 Grade 2 fatigue -Continue home physical therapy to optimize functional status. -continue follow-up with nutritional therapy. -soft BP could be from decreased plasma oncotic pressures from low albumin due to nephrotic syndrome.  #4. Seizure disorder in her the patient has been on Dilantin from one 10 years. Patient knows that he has not had a seizure for very long time. -continue on Keppra  #5 Patient Active Problem List   Diagnosis Date Noted  . Folliculitis 15/17/6160  . AL amyloidosis ([place]) 08/21/2016  . Umbilical hernia 73/71/0626  . DIZZINESS 02/19/2010  . CAD, NATIVE VESSEL 07/21/2009  . CHEST PAIN-UNSPECIFIED 07/21/2009  . HYPERLIPIDEMIA 04/30/2007  . HYPERTENSION 04/30/2007  . GASTROESOPHAGEAL REFLUX DISEASE 04/30/2007  . SEIZURE DISORDER 04/30/2007  . COMPRESSION FRACTURE, L1 VERTEBRA 04/30/2007  . DIVERTICULOSIS, COLON, HX OF 04/30/2007  . Other postprocedural status(V45.89) 04/30/2007  -Continue follow-up with primary care physician for management of other medical co-morbidities.  Complete 6th cycle of treatment as schedule RTC with Dr [name] in 3 weeks with labs   All of the patients and his wife's questions were answered to their apparent satisfaction. They are agreeable with the plan as noted above .The patient knows to call the clinic with any problems, questions or concerns.  I spent 20 minutes counseling the patient face to face. The total time spent in the appointment was 25 minutes and more than 50% was on counseling and direct patient cares.    Sullivan Lone MD [place] AAHIVMS [facility] Hematology/Oncology Physician [facility]  (Office):       [phone] (Work cell):  [phone] (Fax):           [phone]

## 2016-12-19 NOTE — Progress Notes (Signed)
Nutrition follow-up completed with patient receiving treatment for kidney AL amyloidosis with nephrotic syndrome. Weight improved documented as 191.9 pounds on August 27. Patient reports appetite has not changed.  He continues to force himself to eat. Gives credit to high-calorie, high-protein smoothies for his weight gain.  Nutrition diagnosis: Inadequate oral intake improved.  Intervention: Provided support and encouragement for patient to continue small frequent meals and snacks with high-calorie, high-protein foods. Questions were answered.  Teach back method used.  Monitoring, evaluation, goals: Patient will tolerate adequate calories and protein for weight maintenance.  Next visit: To be scheduled as needed.  **Disclaimer: This note was dictated with voice recognition software. Similar sounding words can inadvertently be transcribed and this note may contain transcription errors which may not have been corrected upon publication of note.**

## 2016-12-20 ENCOUNTER — Telehealth: Payer: Self-pay

## 2016-12-20 NOTE — Telephone Encounter (Signed)
Returning call regarding [name] for pt. Alex [name] stated that she had already gotten a call from Memory Dance, RN this AM. Has all the information that she needs to proceed with pt care.

## 2016-12-20 NOTE — Telephone Encounter (Signed)
Melanie PT with [facility] called for PT order 2x/wk for 4 weeks. Gave VO per office visit note 12/19/16.

## 2016-12-21 ENCOUNTER — Telehealth: Payer: Self-pay

## 2016-12-21 DIAGNOSIS — G40909 Epilepsy, unspecified, not intractable, without status epilepticus: Secondary | ICD-10-CM | POA: Diagnosis not present

## 2016-12-21 DIAGNOSIS — I10 Essential (primary) hypertension: Secondary | ICD-10-CM | POA: Diagnosis not present

## 2016-12-21 DIAGNOSIS — C9000 Multiple myeloma not having achieved remission: Secondary | ICD-10-CM | POA: Diagnosis not present

## 2016-12-21 DIAGNOSIS — I350 Nonrheumatic aortic (valve) stenosis: Secondary | ICD-10-CM | POA: Diagnosis not present

## 2016-12-21 DIAGNOSIS — I2510 Atherosclerotic heart disease of native coronary artery without angina pectoris: Secondary | ICD-10-CM | POA: Diagnosis not present

## 2016-12-21 DIAGNOSIS — K5730 Diverticulosis of large intestine without perforation or abscess without bleeding: Secondary | ICD-10-CM | POA: Diagnosis not present

## 2016-12-21 DIAGNOSIS — N2889 Other specified disorders of kidney and ureter: Secondary | ICD-10-CM | POA: Diagnosis not present

## 2016-12-21 DIAGNOSIS — E785 Hyperlipidemia, unspecified: Secondary | ICD-10-CM | POA: Diagnosis not present

## 2016-12-21 DIAGNOSIS — E8581 Light chain (AL) amyloidosis: Secondary | ICD-10-CM | POA: Diagnosis not present

## 2016-12-21 DIAGNOSIS — G4733 Obstructive sleep apnea (adult) (pediatric): Secondary | ICD-10-CM | POA: Diagnosis not present

## 2016-12-21 NOTE — Telephone Encounter (Signed)
Attempt to call and check on pt regarding diarrhea. No answer. Plan to f/u tomorrow.

## 2016-12-22 ENCOUNTER — Encounter: Payer: Self-pay | Admitting: General Practice

## 2016-12-22 ENCOUNTER — Telehealth: Payer: Self-pay | Admitting: Hematology

## 2016-12-22 ENCOUNTER — Ambulatory Visit (HOSPITAL_BASED_OUTPATIENT_CLINIC_OR_DEPARTMENT_OTHER): Payer: Medicare HMO

## 2016-12-22 VITALS — BP 101/69 | HR 85 | Temp 97.7°F | Resp 18

## 2016-12-22 DIAGNOSIS — Z5112 Encounter for antineoplastic immunotherapy: Secondary | ICD-10-CM

## 2016-12-22 DIAGNOSIS — N08 Glomerular disorders in diseases classified elsewhere: Secondary | ICD-10-CM | POA: Diagnosis not present

## 2016-12-22 DIAGNOSIS — E8581 Light chain (AL) amyloidosis: Secondary | ICD-10-CM

## 2016-12-22 DIAGNOSIS — E854 Organ-limited amyloidosis: Secondary | ICD-10-CM

## 2016-12-22 MED ORDER — BORTEZOMIB CHEMO SQ INJECTION 3.5 MG (2.5MG/ML)
1.3000 mg/m2 | Freq: Once | INTRAMUSCULAR | Status: AC
  Administered 2016-12-22: 2.75 mg via SUBCUTANEOUS
  Filled 2016-12-22: qty 2.75

## 2016-12-22 MED ORDER — PROCHLORPERAZINE MALEATE 10 MG PO TABS: 10.0000 mg | ORAL_TABLET | Freq: Four times a day (QID) | ORAL | 1 refills | Status: DC | PRN

## 2016-12-22 MED ORDER — ONDANSETRON HCL 8 MG PO TABS
ORAL_TABLET | ORAL | Status: AC
  Filled 2016-12-22: qty 1

## 2016-12-22 MED ORDER — SODIUM CHLORIDE 0.9 % IV SOLN
Freq: Once | INTRAVENOUS | Status: AC
  Administered 2016-12-22: 10:00:00 via INTRAVENOUS

## 2016-12-22 MED ORDER — ONDANSETRON HCL 8 MG PO TABS
8.0000 mg | ORAL_TABLET | Freq: Once | ORAL | Status: AC
  Administered 2016-12-22: 8 mg via ORAL

## 2016-12-22 NOTE — Telephone Encounter (Signed)
Called [facility] to schedule pt appt. Pt's insurance needs to clear before scheduling appt.  Alex [name] will contact the pt with appt. Once insurance is cleared. Faxed path reports.

## 2016-12-22 NOTE — Progress Notes (Signed)
[facility] Spiritual Care Note  Followed up with Mr and Ms Kluever in infusion.  Despite reporting a rough morning, pt was feistier today, engaging in conversation and humor.  Ms Ernst Breach also appeared less distressed.  Encouraged their talk of planning a beach trip and other meaning-making occasions to look forward to.  We plan to f/u by phone next week (particularly with an eye toward making space for Ms Hecht to process recent stressors in more detail).   [place], [place], [facility] Pager [phone] Voicemail [phone]

## 2016-12-22 NOTE — Patient Instructions (Signed)
[facility] Discharge Instructions for Patients Receiving Chemotherapy  Today you received the following chemotherapy agents velcade   To help prevent nausea and vomiting after your treatment, we encourage you to take your nausea medication as directed  If you develop nausea and vomiting that is not controlled by your nausea medication, call the clinic.   BELOW ARE SYMPTOMS THAT SHOULD BE REPORTED IMMEDIATELY:  *FEVER GREATER THAN 100.5 F  *CHILLS WITH OR WITHOUT FEVER  NAUSEA AND VOMITING THAT IS NOT CONTROLLED WITH YOUR NAUSEA MEDICATION  *UNUSUAL SHORTNESS OF BREATH  *UNUSUAL BRUISING OR BLEEDING  TENDERNESS IN MOUTH AND THROAT WITH OR WITHOUT PRESENCE OF ULCERS  *URINARY PROBLEMS  *BOWEL PROBLEMS  UNUSUAL RASH Items with * indicate a potential emergency and should be followed up as soon as possible.  Feel free to call the clinic you have any questions or concerns. The clinic phone number is [phone].  

## 2016-12-23 ENCOUNTER — Telehealth: Payer: Self-pay

## 2016-12-23 ENCOUNTER — Other Ambulatory Visit: Payer: Self-pay

## 2016-12-23 ENCOUNTER — Telehealth: Payer: Self-pay | Admitting: Pharmacy Technician

## 2016-12-23 ENCOUNTER — Telehealth: Payer: Self-pay | Admitting: Pharmacist

## 2016-12-23 DIAGNOSIS — E8581 Light chain (AL) amyloidosis: Secondary | ICD-10-CM

## 2016-12-23 MED ORDER — IXAZOMIB CITRATE 4 MG PO CAPS
4.0000 mg | ORAL_CAPSULE | ORAL | 3 refills | Status: DC
Start: 2016-12-23 — End: 2017-03-20

## 2016-12-23 MED ORDER — MAGIC MOUTHWASH W/LIDOCAINE: 5.0000 mL | Freq: Four times a day (QID) | ORAL | 0 refills | Status: DC | PRN

## 2016-12-23 NOTE — Telephone Encounter (Signed)
Called pt regarding multiple queries. Pt had been experiencing diarrhea. Better today. Pt taking immodium which is helping. No c/o fever. Told pt and wife that if diarrhea becomes severe pt can skip day 15 cyclophosphamide, and would wait until next dose scheduled. If pt diarrhea becomes that bad, pt or wife to call and let us know because pt will most likely need IVF and electrolyte replacement. Wife verbalized understanding. BP additionally 051 systolic. Magic mouthwash is available at [facility] and can be taken to [facility] for filling. Pt wife plans to come today because we are closed tomorrow through Tuesday. Dr. [name] recommended holding off on the shingles vaccine for another two years. Lastly, PT from [facility] called regarding potential order of a tens unit (sp) to help with pt lower back pain. This will be something that needs to be ordered through pt PCP. Will f/u with PT Melanie from [facility].   Pt wife completed talk back of information shared.

## 2016-12-23 NOTE — Telephone Encounter (Signed)
Oral Oncology Patient Advocate Encounter  Received notification from [place] that prior authorization for Alex [name] is required.  PA submitted on CoverMyMeds Key [id number] Status is pending  Oral Oncology Clinic will continue to follow.  Alex [name]. Alex [name], [place] Patient Alex [name] [phone] 12/23/2016 2:23 PM

## 2016-12-23 NOTE — Telephone Encounter (Signed)
Pt PT requesting tens unit (sp). Dr. [name] recommends PCP order this because back pain not related to his amyloidosis in his opinion. Threasa Beards, PT told me she will get in touch with PCP Dr. [name].

## 2016-12-23 NOTE — Telephone Encounter (Signed)
Oral Oncology Pharmacist Encounter  Received new prescription for Ninlaro for the maintenance treatment of AL Amyloidosis, planned duration until disease progression or unacceptable toxicity.  Labs from 12/19/16 assessed, OK for treatment.  Current medication list in Epic reviewed, no DDIs with Ninlaro identified. Noted patient with acyclovir for VZV prophylaxis.  Prescription has been e-scribed to the [facility] for benefits analysis and approval. Prior authorization has been submitted, status is pending.   I spoke with patient for overview of new oral chemotherapy medication: Ninlaro.   Counseled patient on administration, dosing, side effects, safe handling, and monitoring.  Patient will take Ninlaro 4mg  capsule, 1 capsule by mouth once weekly for 3 weeks on, 1 week off, repeat every 4 weeks. Patient has been on CyBorD regimen, taking his cyclophosphamide capsules on Mondays for day 1, 8, and 15, every 28 days.  He will continue this Monday dosing scheme with Ninlaro once he receives it. Patient knows to take his Ninlaro on an empty stomach, 1 hour before, or 2 hours after a meal.  Patient will continue acyclovir 400mg  tablets BID for VZV prophylaxis.  Side effects include but not limited to: nausea, vomiting, diarrhea, decreased blood counts, and peripheral neuropathy.    Reviewed with patient importance of keeping a medication schedule and plan for any missed doses.  [name] voiced understanding and appreciation.   All questions answered.  Patient knows to call the office with questions or concerns.   Oral Oncology Clinic will continue to follow for insurance authorization, copayment issues, and start date.  Thank you,  Johny Drilling, PharmD, BCPS, BCOP 12/23/2016  3:34 PM Oral Oncology Clinic [phone]

## 2016-12-26 DIAGNOSIS — C9000 Multiple myeloma not having achieved remission: Secondary | ICD-10-CM | POA: Diagnosis not present

## 2016-12-26 DIAGNOSIS — E785 Hyperlipidemia, unspecified: Secondary | ICD-10-CM | POA: Diagnosis not present

## 2016-12-26 DIAGNOSIS — G40909 Epilepsy, unspecified, not intractable, without status epilepticus: Secondary | ICD-10-CM | POA: Diagnosis not present

## 2016-12-26 DIAGNOSIS — K5730 Diverticulosis of large intestine without perforation or abscess without bleeding: Secondary | ICD-10-CM | POA: Diagnosis not present

## 2016-12-26 DIAGNOSIS — I2510 Atherosclerotic heart disease of native coronary artery without angina pectoris: Secondary | ICD-10-CM | POA: Diagnosis not present

## 2016-12-26 DIAGNOSIS — E8581 Light chain (AL) amyloidosis: Secondary | ICD-10-CM | POA: Diagnosis not present

## 2016-12-26 DIAGNOSIS — N2889 Other specified disorders of kidney and ureter: Secondary | ICD-10-CM | POA: Diagnosis not present

## 2016-12-26 DIAGNOSIS — I350 Nonrheumatic aortic (valve) stenosis: Secondary | ICD-10-CM | POA: Diagnosis not present

## 2016-12-26 DIAGNOSIS — I10 Essential (primary) hypertension: Secondary | ICD-10-CM | POA: Diagnosis not present

## 2016-12-26 DIAGNOSIS — G4733 Obstructive sleep apnea (adult) (pediatric): Secondary | ICD-10-CM | POA: Diagnosis not present

## 2016-12-27 ENCOUNTER — Ambulatory Visit (HOSPITAL_BASED_OUTPATIENT_CLINIC_OR_DEPARTMENT_OTHER): Payer: Medicare HMO

## 2016-12-27 ENCOUNTER — Telehealth: Payer: Self-pay

## 2016-12-27 VITALS — BP 107/63 | HR 72 | Temp 97.9°F | Resp 18

## 2016-12-27 DIAGNOSIS — E8581 Light chain (AL) amyloidosis: Secondary | ICD-10-CM

## 2016-12-27 DIAGNOSIS — N08 Glomerular disorders in diseases classified elsewhere: Secondary | ICD-10-CM | POA: Diagnosis not present

## 2016-12-27 DIAGNOSIS — E854 Organ-limited amyloidosis: Secondary | ICD-10-CM

## 2016-12-27 MED ORDER — SODIUM CHLORIDE 0.9 % IV SOLN
Freq: Once | INTRAVENOUS | Status: AC
  Administered 2016-12-27: 16:00:00 via INTRAVENOUS

## 2016-12-27 NOTE — Telephone Encounter (Signed)
Forgot to share information with pt when here for appt today. Oneida Arenas, RN at [facility] has pt scheduled for appt with Dr. Junius Finner on 9/10 at 1345. Pt packet sent to house with directions and pt to arrive at 1330 for appt. Call back number for patient [phone]) [phone]. Wife verbalized understanding and thanks for information.  Call back number for health care personnel [phone].

## 2016-12-27 NOTE — Telephone Encounter (Signed)
Oral Oncology Alex Advocate Encounter  Prior Authorization for Alex [name] has been approved.    Effective dates: 04/23/2016 through 04/24/2017  Oral Oncology Clinic will continue to follow.   Alex [name]. Alex [name], Alex [name] Alex [name] [phone] 12/27/2016 9:24 AM

## 2016-12-27 NOTE — Patient Instructions (Signed)
Dehydration, Adult Dehydration is when there is not enough fluid or water in your body. This happens when you lose more fluids than you take in. Dehydration can range from mild to very bad. It should be treated right away to keep it from getting very bad. Symptoms of mild dehydration may include:  Thirst.  Dry lips.  Slightly dry mouth.  Dry, warm skin.  Dizziness. Symptoms of moderate dehydration may include:  Very dry mouth.  Muscle cramps.  Dark pee (urine). Pee may be the color of tea.  Your body making less pee.  Your eyes making fewer tears.  Heartbeat that is uneven or faster than normal (palpitations).  Headache.  Light-headedness, especially when you stand up from sitting.  Fainting (syncope). Symptoms of very bad dehydration may include:  Changes in skin, such as: ? Cold and clammy skin. ? Blotchy (mottled) or pale skin. ? Skin that does not quickly return to normal after being lightly pinched and let go (poor skin turgor).  Changes in body fluids, such as: ? Feeling very thirsty. ? Your eyes making fewer tears. ? Not sweating when body temperature is high, such as in hot weather. ? Your body making very little pee.  Changes in vital signs, such as: ? Weak pulse. ? Pulse that is more than 100 beats a minute when you are sitting still. ? Fast breathing. ? Low blood pressure.  Other changes, such as: ? Sunken eyes. ? Cold hands and feet. ? Confusion. ? Lack of energy (lethargy). ? Trouble waking up from sleep. ? Short-term weight loss. ? Unconsciousness. Follow these instructions at home:  If told by your doctor, drink an ORS: ? Make an ORS by using instructions on the package. ? Start by drinking small amounts, about  cup (120 mL) every 5-10 minutes. ? Slowly drink more until you have had the amount that your doctor said to have.  Drink enough clear fluid to keep your pee clear or pale yellow. If you were told to drink an ORS, finish the ORS  first, then start slowly drinking clear fluids. Drink fluids such as: ? Water. Do not drink only water by itself. Doing that can make the salt (sodium) level in your body get too low (hyponatremia). ? Ice chips. ? Fruit juice that you have added water to (diluted). ? Low-calorie sports drinks.  Avoid: ? Alcohol. ? Drinks that have a lot of sugar. These include high-calorie sports drinks, fruit juice that does not have water added, and soda. ? Caffeine. ? Foods that are greasy or have a lot of fat or sugar.  Take over-the-counter and prescription medicines only as told by your doctor.  Do not take salt tablets. Doing that can make the salt level in your body get too high (hypernatremia).  Eat foods that have minerals (electrolytes). Examples include bananas, oranges, potatoes, tomatoes, and spinach.  Keep all follow-up visits as told by your doctor. This is important. Contact a doctor if:  You have belly (abdominal) pain that: ? Gets worse. ? Stays in one area (localizes).  You have a rash.  You have a stiff neck.  You get angry or annoyed more easily than normal (irritability).  You are more sleepy than normal.  You have a harder time waking up than normal.  You feel: ? Weak. ? Dizzy. ? Very thirsty.  You have peed (urinated) only a small amount of very dark pee during 6-8 hours. Get help right away if:  You have symptoms of   very bad dehydration.  You cannot drink fluids without throwing up (vomiting).  Your symptoms get worse with treatment.  You have a fever.  You have a very bad headache.  You are throwing up or having watery poop (diarrhea) and it: ? Gets worse. ? Does not go away.  You have blood or something green (bile) in your throw-up.  You have blood in your poop (stool). This may cause poop to look black and tarry.  You have not peed in 6-8 hours.  You pass out (faint).  Your heart rate when you are sitting still is more than 100 beats a  minute.  You have trouble breathing. This information is not intended to replace advice given to you by your health care provider. Make sure you discuss any questions you have with your health care provider. Document Released: 02/05/2009 Document Revised: 10/30/2015 Document Reviewed: 06/05/2015 Elsevier Interactive Patient Education  2018 Elsevier Inc.  

## 2016-12-27 NOTE — Telephone Encounter (Signed)
Pt experiencing dizziness, fatigue, and hypotension over the weekend. Added on for 1L IVF in infusion today per Dr. [name]. Verified okay to add in infusion per Claudie Fisherman.

## 2016-12-28 DIAGNOSIS — I350 Nonrheumatic aortic (valve) stenosis: Secondary | ICD-10-CM | POA: Diagnosis not present

## 2016-12-28 DIAGNOSIS — E785 Hyperlipidemia, unspecified: Secondary | ICD-10-CM | POA: Diagnosis not present

## 2016-12-28 DIAGNOSIS — E8581 Light chain (AL) amyloidosis: Secondary | ICD-10-CM | POA: Diagnosis not present

## 2016-12-28 DIAGNOSIS — I10 Essential (primary) hypertension: Secondary | ICD-10-CM | POA: Diagnosis not present

## 2016-12-28 DIAGNOSIS — G40909 Epilepsy, unspecified, not intractable, without status epilepticus: Secondary | ICD-10-CM | POA: Diagnosis not present

## 2016-12-28 DIAGNOSIS — I2510 Atherosclerotic heart disease of native coronary artery without angina pectoris: Secondary | ICD-10-CM | POA: Diagnosis not present

## 2016-12-28 DIAGNOSIS — K5730 Diverticulosis of large intestine without perforation or abscess without bleeding: Secondary | ICD-10-CM | POA: Diagnosis not present

## 2016-12-28 DIAGNOSIS — C9000 Multiple myeloma not having achieved remission: Secondary | ICD-10-CM | POA: Diagnosis not present

## 2016-12-28 DIAGNOSIS — N2889 Other specified disorders of kidney and ureter: Secondary | ICD-10-CM | POA: Diagnosis not present

## 2016-12-28 DIAGNOSIS — G4733 Obstructive sleep apnea (adult) (pediatric): Secondary | ICD-10-CM | POA: Diagnosis not present

## 2016-12-29 ENCOUNTER — Telehealth: Payer: Self-pay | Admitting: Pharmacy Technician

## 2016-12-29 ENCOUNTER — Encounter: Payer: Self-pay | Admitting: General Practice

## 2016-12-29 NOTE — Progress Notes (Signed)
[facility] Spiritual Care Note  Followed up with spouse Mardene Celeste by phone for further emotional support.  She describes pt as "doing better today than he has in the last five days.  Her own distress sounds less acute, "but," she concludes with a sigh, "we're tired."  Provided pastoral listening and care.  She knows to call anytime.   [place], [place], [facility] Pager [phone] Voicemail [phone]

## 2016-12-29 NOTE — Telephone Encounter (Signed)
Oral Chemotherapy Pharmacist Encounter   I spoke with patient's wife, Mardene Celeste, for overview of new oral chemotherapy medication: Ninlaro.   Counseled Patricia on administration, dosing, side effects, safe handling, and monitoring.  Patient will take Ninlaro 4mg  capsule, 1 capsule by mouth once weekly for 3 weeks on, 1 week off, repeat every 4 weeks. Patient has been on CyBorD regimen, taking his cyclophosphamide capsules on Mondays for day 1, 8, and 15, every 28 days.  Mardene Celeste agrees Mr. Joyce will continue this Monday dosing scheme with Ninlaro. Mardene Celeste counseled for Mr. Kalan to take his Ninlaro on an empty stomach, 1 hour before, or 2 hours after a meal.  Side effects include but not limited to: nausea, vomiting, diarrhea, decreased blood counts, and peripheral neuropathy.   Reviewed with patient importance of keeping a medication schedule and plan for any missed doses.  Mrs. Newborn voiced understanding and appreciation.   All questions answered.  Oral Oncology Clinic working to secure copayment assistance to medication acquisition.  Mrs. Ambrosio counseled to wait until 01/09/17 office visit prior to starting Ninlaro.  We will call her if we receive direction from Dr. [name] to start sooner, however, it may take that long for medication acquisition anyways.  Patient and wife know to call the office with questions or concerns. Oral Oncology Clinic will continue to follow.  Thank you,  Johny Drilling, PharmD, BCPS, BCOP 12/29/2016  2:45 PM Oral Oncology Clinic [phone]

## 2016-12-29 NOTE — Telephone Encounter (Signed)
Oral Oncology Patient Advocate Encounter  Spoke with Mrs. Juarbe to inform them of the prior authorization approval and copayment status of the new prescription for Ninlaro.  The initial monthly copay is $2326.28.  This is prohibitively expensive for the patient.    I have applied for a copayment assistance grant through [employer].  This grant's approval is pending a signed application being returned to [employer].  If approved, this grant will make the out of pocket expense $0 and we will fill the prescription at that time.    Mr and Mrs Ernst Breach will return the application to [employer] as soon as they receive it.    Oral oncology clinic will continue to follow.    Fabio Asa. Melynda Keller, [place] Patient [place] [phone] 12/29/2016 3:14 PM

## 2016-12-30 ENCOUNTER — Telehealth: Payer: Self-pay | Admitting: *Deleted

## 2016-12-30 NOTE — Telephone Encounter (Signed)
Pt wife called stating pt blood pressure this morning 73/48.  Pt is dizzy/lightheaded.  Pt is drinking "some" fluids.  Has had a "shake" and some gatorade this morning.  Reviewed with Dr. [name].  Pt should recheck BP and if remains that low, pt needs to be evaluated in ED.  Verbalized this to wife.  Wife asked "what if he gets up and walks around some, will that bring up his BP?"  Explained to wife that will not increase BP if he is dehydrated/having an underlying cardiac issue.  Wife stated that pt will "probably not go to ED"  Wife will continue to give pt fluids and recheck BP.  Reiterated to wife that if BP remains low, it is in his best interest to go to ED.  Wife will call us with updates.

## 2017-01-02 ENCOUNTER — Telehealth: Payer: Self-pay

## 2017-01-02 DIAGNOSIS — G40909 Epilepsy, unspecified, not intractable, without status epilepticus: Secondary | ICD-10-CM | POA: Diagnosis not present

## 2017-01-02 DIAGNOSIS — C9000 Multiple myeloma not having achieved remission: Secondary | ICD-10-CM | POA: Diagnosis not present

## 2017-01-02 DIAGNOSIS — K5730 Diverticulosis of large intestine without perforation or abscess without bleeding: Secondary | ICD-10-CM | POA: Diagnosis not present

## 2017-01-02 DIAGNOSIS — I701 Atherosclerosis of renal artery: Secondary | ICD-10-CM | POA: Diagnosis not present

## 2017-01-02 DIAGNOSIS — E854 Organ-limited amyloidosis: Secondary | ICD-10-CM | POA: Diagnosis not present

## 2017-01-02 DIAGNOSIS — E785 Hyperlipidemia, unspecified: Secondary | ICD-10-CM | POA: Diagnosis not present

## 2017-01-02 DIAGNOSIS — N2889 Other specified disorders of kidney and ureter: Secondary | ICD-10-CM | POA: Diagnosis not present

## 2017-01-02 DIAGNOSIS — E8581 Light chain (AL) amyloidosis: Secondary | ICD-10-CM | POA: Diagnosis not present

## 2017-01-02 DIAGNOSIS — I350 Nonrheumatic aortic (valve) stenosis: Secondary | ICD-10-CM | POA: Diagnosis not present

## 2017-01-02 DIAGNOSIS — G4733 Obstructive sleep apnea (adult) (pediatric): Secondary | ICD-10-CM | POA: Diagnosis not present

## 2017-01-02 DIAGNOSIS — I2510 Atherosclerotic heart disease of native coronary artery without angina pectoris: Secondary | ICD-10-CM | POA: Diagnosis not present

## 2017-01-02 DIAGNOSIS — I10 Essential (primary) hypertension: Secondary | ICD-10-CM | POA: Diagnosis not present

## 2017-01-02 NOTE — Telephone Encounter (Signed)
No answer. Left VM regarding diarrhea and fatigue. If pt becomes exceedingly weak or low BP to go to the ED. To start or continue taking immodium. Wife to call in the AM to f/u.

## 2017-01-03 DIAGNOSIS — Z6828 Body mass index (BMI) 28.0-28.9, adult: Secondary | ICD-10-CM | POA: Diagnosis not present

## 2017-01-03 DIAGNOSIS — H6123 Impacted cerumen, bilateral: Secondary | ICD-10-CM | POA: Diagnosis not present

## 2017-01-04 ENCOUNTER — Telehealth: Payer: Self-pay

## 2017-01-04 DIAGNOSIS — G40909 Epilepsy, unspecified, not intractable, without status epilepticus: Secondary | ICD-10-CM | POA: Diagnosis not present

## 2017-01-04 DIAGNOSIS — C9000 Multiple myeloma not having achieved remission: Secondary | ICD-10-CM | POA: Diagnosis not present

## 2017-01-04 DIAGNOSIS — I2510 Atherosclerotic heart disease of native coronary artery without angina pectoris: Secondary | ICD-10-CM | POA: Diagnosis not present

## 2017-01-04 DIAGNOSIS — I10 Essential (primary) hypertension: Secondary | ICD-10-CM | POA: Diagnosis not present

## 2017-01-04 DIAGNOSIS — E859 Amyloidosis, unspecified: Secondary | ICD-10-CM | POA: Diagnosis not present

## 2017-01-04 DIAGNOSIS — E785 Hyperlipidemia, unspecified: Secondary | ICD-10-CM | POA: Diagnosis not present

## 2017-01-04 DIAGNOSIS — N2889 Other specified disorders of kidney and ureter: Secondary | ICD-10-CM | POA: Diagnosis not present

## 2017-01-04 DIAGNOSIS — G4733 Obstructive sleep apnea (adult) (pediatric): Secondary | ICD-10-CM | POA: Diagnosis not present

## 2017-01-04 DIAGNOSIS — K5730 Diverticulosis of large intestine without perforation or abscess without bleeding: Secondary | ICD-10-CM | POA: Diagnosis not present

## 2017-01-04 DIAGNOSIS — I350 Nonrheumatic aortic (valve) stenosis: Secondary | ICD-10-CM | POA: Diagnosis not present

## 2017-01-04 DIAGNOSIS — E8581 Light chain (AL) amyloidosis: Secondary | ICD-10-CM | POA: Diagnosis not present

## 2017-01-04 NOTE — Telephone Encounter (Signed)
Called pt home and got in touch with wife. Eye swelling still noted. Pt wife plan to get in touch with nephrologist and PCP regarding symptom. Per Dr. [name] not likely to be side effect of chemotherapy treatment which has been stopped. Pt missed appt Monday with [facility] because wife was sick. Wife to f/u with [facility] regarding initial appt to discuss potential stem cell transplant. Pt improving in relation to mobility and blood pressure. Being seen by a PT regularly. Pt wife impatient for improvement. Pt wife knows to keep nephrologist and PCP aware of pt symptoms in regards to nephrotic syndrome.

## 2017-01-09 ENCOUNTER — Telehealth: Payer: Self-pay | Admitting: Hematology

## 2017-01-09 ENCOUNTER — Ambulatory Visit (HOSPITAL_BASED_OUTPATIENT_CLINIC_OR_DEPARTMENT_OTHER): Payer: Medicare HMO | Admitting: Hematology

## 2017-01-09 ENCOUNTER — Other Ambulatory Visit (HOSPITAL_BASED_OUTPATIENT_CLINIC_OR_DEPARTMENT_OTHER): Payer: Medicare HMO

## 2017-01-09 VITALS — BP 111/69 | HR 71 | Temp 98.3°F | Resp 18 | Ht 71.0 in | Wt 191.4 lb

## 2017-01-09 DIAGNOSIS — E854 Organ-limited amyloidosis: Secondary | ICD-10-CM | POA: Diagnosis not present

## 2017-01-09 DIAGNOSIS — I10 Essential (primary) hypertension: Secondary | ICD-10-CM | POA: Diagnosis not present

## 2017-01-09 DIAGNOSIS — C9000 Multiple myeloma not having achieved remission: Secondary | ICD-10-CM | POA: Diagnosis not present

## 2017-01-09 DIAGNOSIS — I2510 Atherosclerotic heart disease of native coronary artery without angina pectoris: Secondary | ICD-10-CM | POA: Diagnosis not present

## 2017-01-09 DIAGNOSIS — G4733 Obstructive sleep apnea (adult) (pediatric): Secondary | ICD-10-CM | POA: Diagnosis not present

## 2017-01-09 DIAGNOSIS — N08 Glomerular disorders in diseases classified elsewhere: Secondary | ICD-10-CM

## 2017-01-09 DIAGNOSIS — N2889 Other specified disorders of kidney and ureter: Secondary | ICD-10-CM | POA: Diagnosis not present

## 2017-01-09 DIAGNOSIS — E8581 Light chain (AL) amyloidosis: Secondary | ICD-10-CM

## 2017-01-09 DIAGNOSIS — K5730 Diverticulosis of large intestine without perforation or abscess without bleeding: Secondary | ICD-10-CM | POA: Diagnosis not present

## 2017-01-09 DIAGNOSIS — I350 Nonrheumatic aortic (valve) stenosis: Secondary | ICD-10-CM | POA: Diagnosis not present

## 2017-01-09 DIAGNOSIS — G40909 Epilepsy, unspecified, not intractable, without status epilepticus: Secondary | ICD-10-CM | POA: Diagnosis not present

## 2017-01-09 DIAGNOSIS — D689 Coagulation defect, unspecified: Secondary | ICD-10-CM

## 2017-01-09 DIAGNOSIS — E785 Hyperlipidemia, unspecified: Secondary | ICD-10-CM | POA: Diagnosis not present

## 2017-01-09 LAB — COMPREHENSIVE METABOLIC PANEL
ALBUMIN: 2.6 g/dL — AB (ref 3.5–5.0)
ALK PHOS: 70 U/L (ref 40–150)
ALT: 15 U/L (ref 0–55)
AST: 21 U/L (ref 5–34)
Anion Gap: 9 mEq/L (ref 3–11)
BILIRUBIN TOTAL: 0.72 mg/dL (ref 0.20–1.20)
BUN: 13.8 mg/dL (ref 7.0–26.0)
CALCIUM: 8.9 mg/dL (ref 8.4–10.4)
CO2: 23 mEq/L (ref 22–29)
Chloride: 110 mEq/L — ABNORMAL HIGH (ref 98–109)
Creatinine: 1.1 mg/dL (ref 0.7–1.3)
EGFR: 63 mL/min/{1.73_m2} — AB (ref >90)
GLUCOSE: 119 mg/dL (ref 70–140)
Potassium: 3.8 mEq/L (ref 3.5–5.1)
SODIUM: 142 meq/L (ref 136–145)
TOTAL PROTEIN: 5.4 g/dL — AB (ref 6.4–8.3)

## 2017-01-09 LAB — CBC & DIFF AND RETIC
BASO%: 1 % (ref 0.0–2.0)
BASOS ABS: 0.1 10*3/uL (ref 0.0–0.1)
EOS ABS: 0.3 10*3/uL (ref 0.0–0.5)
EOS%: 4.3 % (ref 0.0–7.0)
HEMATOCRIT: 31.9 % — AB (ref 38.4–49.9)
HEMOGLOBIN: 10.8 g/dL — AB (ref 13.0–17.1)
IMMATURE RETIC FRACT: 11.1 % — AB (ref 3.00–10.60)
LYMPH%: 17.2 % (ref 14.0–49.0)
MCH: 33.8 pg — AB (ref 27.2–33.4)
MCHC: 33.9 g/dL (ref 32.0–36.0)
MCV: 99.7 fL — AB (ref 79.3–98.0)
MONO#: 0.4 10*3/uL (ref 0.1–0.9)
MONO%: 7.2 % (ref 0.0–14.0)
NEUT#: 4.1 10*3/uL (ref 1.5–6.5)
NEUT%: 70.3 % (ref 39.0–75.0)
PLATELETS: 126 10*3/uL — AB (ref 140–400)
RBC: 3.2 10*6/uL — ABNORMAL LOW (ref 4.20–5.82)
RDW: 15.9 % — ABNORMAL HIGH (ref 11.0–14.6)
Retic %: 2.77 % — ABNORMAL HIGH (ref 0.80–1.80)
Retic Ct Abs: 88.64 10*3/uL (ref 34.80–93.90)
WBC: 5.8 10*3/uL (ref 4.0–10.3)
lymph#: 1 10*3/uL (ref 0.9–3.3)

## 2017-01-09 NOTE — Telephone Encounter (Signed)
Gave avs and calendar for October  °

## 2017-01-09 NOTE — Progress Notes (Signed)
[name] Kitchen    HEMATOLOGY/ONCOLOGY CLINIC NOTE  Date of Service: .01/09/2017  Patient Care Team: [name], MD as PCP - General (Internal Medicine) Brunetta Genera, MD as Consulting Physician (Hematology) Nephrology - Dr Madelon Lips MD Dr Henrene Pastor MD - GI  CHIEF COMPLAINTS/PURPOSE OF CONSULTATION:  AL Amyloidosis -with Nephrotic syndrome . No overt CHF pEF  HISTORY OF PRESENTING ILLNESS:   Alex [name]. is a wonderful 76 y.o. male who has been referred to [place] by Dr .[name], MD /Dr Madelon Lips MD for evaluation and management of newly diagnosed Kidney AL Amyloidosis with nephrotic syndrome.  Patient has a history of hypertension, dyslipidemia, coronary artery disease, seizure disorder on Dilantin who was apparently in his usual state of health until 3 months ago when he started developing new onset lower extremity edema. Patient had a UA at the time that showed 4+ protein in 24-hour collection revealed 15 g of protein. Additional workup showed a creatinine of 0.8 with an albumin of 2.3 and negative SPEP and UPEP. Apparently had an elevated K/L SFLC ratio of 5.19.  He was urgently referred by his primary care physician to nephrology for additional evaluation. Patient notes no bleeding issues. No nosebleeds. No periorbital bleeding. No abnormal skin rashes. Has had significant NSAID exposure and use to take a fair amount of naproxen for chronic low back pain but has cut down on this.  Due to lack of clear etiology for his nephrotic syndrome the patient underwent a kidney biopsy on 08/06/2015 accession [id number] which showed AL amyloidosis, lambda immunophenotype, [place] red positive. Noted to have severe arteriosclerosis with moderate tubular interstitial scarring.  Patient was referred to [place] for further evaluation and treatment of his AL Amyloidosis, Patient notes that his leg swelling has improved with diuretic therapy.  He notes no weight loss. No night sweats. No  focal bone pains. No skin rashes. Lungs normal bleeding or bruising.  Patient notes that he had a lot of reading online and he and his wife had an extensive list of questions which were answered in detail.  He notes he has had some chronic issues with upper abdominal pain and nausea and that he follows with Dr. Henrene Pastor and is apparently being scheduled for an EGD and colonoscopy according to his report.  Denies having and lacks tongue. No chest pain and no overt new shortness of breath. Patient is uncertain if an echocardiogram has been ordered or scheduled.  INTERVAL HISTORY  Alex [name] is here for a scheduled follow-up for his AL amyloidosis. He notes that he has an appointment for follow-up at [facility] on 01/23/2017. His leg swelling has resolved. Swelling around the eyes has improved. Notes improving fatigue now that he has completed his induction treatment. Grade 1 neuropathy which is improving. Appears to have some blepharitis and had styes which improved with topical erythromycin. Has an appointment with ophthalmology. No CP/SOB. Working with home physical therapy and has been trying to walk more. No fevers/chills/night sweats.  MEDICAL HISTORY:  Past Medical History:  Diagnosis Date  . Amyloidosis ([place])   . Arthritis   . CAD (coronary artery disease)    Lexiscan Myoview 6/14: Normal study, no scar or ischemia, EF 62%  . Colon polyp   . Coronary artery disease    moderate disease by cath 2011  . Diverticulosis of colon   . GERD (gastroesophageal reflux disease)   . Hearing loss   . Hiatal hernia   . Hyperlipidemia   . Hypertension   .  MRSA (methicillin resistant staph aureus) culture positive   . Seizure disorder ([place])   . Seizures ([place])    last in 1970s  Obstructive sleep apnea Gastroesophageal reflux disease Aortic stenosis Erectile dysfunction Peripheral neuropathy Seizure disorder   SURGICAL HISTORY: Past Surgical History:  Procedure Laterality  Date  . BUNIONECTOMY    . CARPAL TUNNEL RELEASE  1994   left  . CERVICAL FUSION    . compression fracture    . FOOT SURGERY     right -twice, left foot once  . HERNIA REPAIR     Umbilical with PVP  . KNEE ARTHROSCOPY     left knee  . Leslie   left - scope  . NASAL SEPTUM SURGERY    . NOSE SURGERY     twice  . ROTATOR CUFF REPAIR     twice, both shoulders  . ROTATOR CUFF REPAIR    . SPINE SURGERY     C2, C3, C4  . TRIGGER FINGER RELEASE     both hands, twice  . TRIGGER FINGER RELEASE    . VASECTOMY      SOCIAL HISTORY: Social History   Social History  . Marital status: Married    Spouse name: N/A  . Number of children: 2  . Years of education: 12   Occupational History  . Retired    Social History Main Topics  . Smoking status: Former Smoker    Quit date: 04/25/1969  . Smokeless tobacco: Former Systems developer  . Alcohol use 4.2 - 8.4 oz/week    7 - 14 Glasses of wine per week     Comment: socially shots of wiskey  . Drug use: No  . Sexual activity: Not on file   Other Topics Concern  . Not on file   Social History Narrative   ** Merged History Encounter **   Lives at home w/ his wife   Right-handed   Caffeine: occasional Pepsi      Patient is currently retired.   FAMILY HISTORY: Family History  Problem Relation Age of Onset  . Diabetes Father   . Heart disease Father   . Heart attack Father 47  . Hypertension Father   . Hyperlipidemia Unknown   . Diabetes Unknown   . Hypertension Unknown   . Seizures Unknown   . Thyroid disease Unknown   . Colon cancer Neg Hx   . Stroke Neg Hx     ALLERGIES:  is allergic to ramipril.  MEDICATIONS:  Current Outpatient Prescriptions  Medication Sig Dispense Refill  . acyclovir (ZOVIRAX) 400 MG tablet Take 1 tablet (400 mg total) by mouth 2 (two) times daily. 60 tablet 3  . aspirin EC 81 MG tablet Take 81 mg by mouth daily.    [name] Kitchen atorvastatin (LIPITOR) 40 MG tablet Take 40 mg by mouth at bedtime.     .  cholecalciferol (VITAMIN D) 1000 units tablet Take 1,000 Units by mouth daily.    . clindamycin (CLEOCIN-T) 1 % external solution Apply topically 2 (two) times daily. (Patient taking differently: Apply 1 application topically 2 (two) times daily as needed (for rash). ) 60 mL 0  . cyclophosphamide (CYTOXAN) 50 MG capsule     . dexamethasone (DECADRON) 4 MG tablet Take 10 tablets (40 mg) on days 1, 8, and 15 of chemo. Repeat every 21 days. 30 tablet 3  . erythromycin ophthalmic ointment Place 1 application into both eyes at bedtime. Please apply to red area over eyelid  twice daily and into the affected eye at bed time 3.5 g 1  . ezetimibe (ZETIA) 10 MG tablet Take 10 mg by mouth daily.     [name] Kitchen HYDROcodone-acetaminophen (NORCO/VICODIN) 5-325 MG tablet Take 1-2 tablets by mouth every 4 (four) hours as needed. (Patient taking differently: Take 1-2 tablets by mouth every 4 (four) hours as needed. ) 10 tablet 0  . HYDROcodone-acetaminophen (NORCO/VICODIN) 5-325 MG tablet Take 1 tablet by mouth every 4 (four) hours as needed for moderate pain. 30 tablet 0  . isosorbide mononitrate (IMDUR) 30 MG 24 hr tablet TAKE 1/2 TABLET DAILY 45 tablet 0  . ixazomib citrate (NINLARO) 4 MG capsule Take 1 capsule (4 mg total) by mouth once a week. On D1, D8 and D15 every 28 days.Take on an empty stomach 1hr before or 2hrs after food. 3 capsule 3  . magic mouthwash w/lidocaine SOLN Take 5 mLs by mouth 4 (four) times daily as needed for mouth pain. 1 part viscous lidocaine 2%; 1 part maalox; 1 part diphenhydramine 12.47m per 562melixir 200 mL 0  . mupirocin cream (BACTROBAN) 2 % Apply 1 application topically 2 (two) times daily. (Patient taking differently: Apply 1 application topically 2 (two) times daily as needed (for rash/irritation). ) 30 g 0  . nitroGLYCERIN (NITROSTAT) 0.4 MG SL tablet Place 1 tablet (0.4 mg total) under the tongue every 5 (five) minutes as needed for chest pain. 25 tablet 6  . omeprazole (PRILOSEC) 20 MG  capsule Take 20 mg by mouth daily.      . ondansetron (ZOFRAN) 8 MG tablet Take 1 tablet (8 mg total) by mouth 2 (two) times daily as needed for refractory nausea / vomiting. Start on D 4 and 11. Do not take on D8. 30 tablet 1  . potassium chloride SA (K-DUR,KLOR-CON) 20 MEQ tablet Take 20 mEq by mouth at bedtime.     . prochlorperazine (COMPAZINE) 10 MG tablet Take 1 tablet (10 mg total) by mouth every 6 (six) hours as needed (Nausea or vomiting). 30 tablet 1  . triamcinolone cream (KENALOG) 0.1 % Apply 1 application topically 2 (two) times daily as needed (for skin irritation).      No current facility-administered medications for this visit.     REVIEW OF SYSTEMS:    10 Point review of Systems was done is negative except as noted above.  PHYSICAL EXAMINATION: ECOG PERFORMANCE STATUS: 1 - Symptomatic but completely ambulatory  . Vitals:   01/09/17 0921  BP: 111/69  Pulse: 71  Resp: 18  Temp: 98.3 F (36.8 C)  SpO2: 99%   Filed Weights   01/09/17 0921  Weight: 191 lb 6.4 oz (86.8 kg)   .Body mass index is 26.69 kg/m.  GENERAL:alert, in no acute distress and comfortable SKIN: no acute rashes, no significant lesions EYES: conjunctiva are pink and non-injected, sclera anicteric OROPHARYNX: MMM, no exudates, no oropharyngeal erythema or ulceration, no macroglossia NECK: supple, mild JVD LYMPH:  no palpable lymphadenopathy in the cervical, axillary or inguinal regions LUNGS: clear to auscultation b/l with normal respiratory effort HEART: regular rate & rhythm ABDOMEN:  normoactive bowel sounds , non tender, not distended. Extremity: No pedal edema notedPSYCH: alert & oriented x 3 with fluent speech NEURO: no focal motor/sensory deficits  LABORATORY DATA:  I have reviewed the data as listed  . CBC Latest Ref Rng & Units 01/09/2017 12/19/2016 12/12/2016  WBC 4.0 - 10.3 10e3/uL 5.8 5.5 7.8  Hemoglobin 13.0 - 17.1 g/dL 10.8(L) 11.1(L) 10.9(L)  Hematocrit 38.4 - 49.9 % 31.9(L)  32.2(L) 32.2(L)  Platelets 140 - 400 10e3/uL 126(L) 101(L) 252    CMP Latest Ref Rng & Units 01/09/2017 12/19/2016 12/12/2016  Glucose 70 - 140 mg/dl 119 140 123  BUN 7.0 - 26.0 mg/dL 13.8 22.8 22.3  Creatinine 0.7 - 1.3 mg/dL 1.1 1.0 1.1  Sodium 136 - 145 mEq/L 142 140 141  Potassium 3.5 - 5.1 mEq/L 3.8 3.6 3.4(L)  Chloride 101 - 111 mmol/L - - -  CO2 22 - 29 mEq/L '23 24 23  ' Calcium 8.4 - 10.4 mg/dL 8.9 9.3 9.0  Total Protein 6.4 - 8.3 g/dL 5.4(L) 5.7(L) 5.6(L)  Total Bilirubin 0.20 - 1.20 mg/dL 0.72 0.57 0.72  Alkaline Phos 40 - 150 U/L 70 71 74  AST 5 - 34 U/L '21 27 26  ' ALT 0 - 55 U/L '15 20 25        ' Are you good       RADIOGRAPHIC STUDIES: I have personally reviewed the radiological images as listed and agreed with the findings in the report. No results found. Result status: Edited Result - FINAL                              *[facility] [employer] [facility] [employer].                        [place], [place] [address]                            [phone]  ------------------------------------------------------------------- Transthoracic Echocardiography  (Report amended )  Patient:    Alex [name], Alex [name] Alex #:       [id number] Study Date: 08/24/2016 Gender:     M Age:        19 Height:     180.3 cm Weight:     90.6 kg BSA:        2.15 m^2 Pt. Status: Room:   ATTENDING    Darlina Guys, MD  Willia Craze, Christopher  REFERRING    [place], [place], Outpatient  SONOGRAPHER  Roseanna Rainbow  cc:  ------------------------------------------------------------------- LV EF: 65% -   70%  ------------------------------------------------------------------- Indications:      Aortic stenosis 424.1.  ------------------------------------------------------------------- History:   PMH:  Amyloidosis.  Coronary artery disease.  Angina pectoris.  Risk factors:   Hypertension. Dyslipidemia.  ------------------------------------------------------------------- Study Conclusions  - Left ventricle: The cavity size was normal. Wall thickness was   increased in a pattern of mild LVH. Systolic function was   vigorous. The estimated ejection fraction was in the range of 65%   to 70%. Wall motion was normal; there were no regional wall   motion abnormalities. Doppler parameters are consistent with   abnormal left ventricular relaxation (grade 1 diastolic   dysfunction). - Aortic valve: Valve mobility was mildly restricted. - Aortic root: The aortic root was mildly dilated. - Mitral valve: Calcified annulus.  Impressions:  - Vigorous LV systolic function; mild diastolic dysfunction; mild   LVH; calcified aortic valve with no significant AS (peak velocity  2 m/s; mean gradient 9 mmHg); mildly dilated aortic root.    ASSESSMENT & PLAN:   76 year old male with above-mentioned multiple medical comorbidities with  #1Biopsy proven renal and Systemic AL Amyloidosis BM Bx shows 9% plasma cells and amyloid deposits. Echo shows normal systolic function with only grade 1 diastolic dysfunction. Skeletal survey with no lytic lesions suggestive of multiple myeloma. Serum K/L ratio increased at about 6.22 on diagnosis and has now improved to 1.39 (wnl with normal kappa lambda serum free light chain ratio ) UPEP shows 24h protein improved from 15g to 5g daily and the last recent one is about 7.3 g /24h  #2 Newly diagnosed Nephrotic Syndrome - likely related to AL amyloidosis.  UPEP shows 24h protein improved from 15g to 5g daily and the last recent one is about 7.3 g /24h  Plan --Blood counts stable. -Minimal grade 1 neuropathy from Velcade resolving. -has appointment for transplant evaluation on 01/23/2017 with Dr. Norma Fredrickson at [facility]. -If he does not proceed with transplant would consider possible maintainence treatment with  Ixazomib. -Continue home physical therapy to optimize functional status. -continue follow-up with nutritional therapy.  #3 Grade 1 fatigue - improving -Continue home physical therapy to optimize functional status. -continue follow-up with nutritional therapy.   #4. Seizure disorder in her the patient has been on Dilantin from one 10 years. Patient knows that he has not had a seizure for very long time. -continue on Keppra  #5 Blepharitis with Stye -has f/u with Ophthalmology  #6 Patient Active Problem List   Diagnosis Date Noted  . Folliculitis 75/01/2584  . AL amyloidosis ([place]) 08/21/2016  . Umbilical hernia 27/78/2423  . DIZZINESS 02/19/2010  . CAD, NATIVE VESSEL 07/21/2009  . CHEST PAIN-UNSPECIFIED 07/21/2009  . HYPERLIPIDEMIA 04/30/2007  . HYPERTENSION 04/30/2007  . GASTROESOPHAGEAL REFLUX DISEASE 04/30/2007  . SEIZURE DISORDER 04/30/2007  . COMPRESSION FRACTURE, L1 VERTEBRA 04/30/2007  . DIVERTICULOSIS, COLON, HX OF 04/30/2007  . Other postprocedural status(V45.89) 04/30/2007  -Continue follow-up with primary care physician for management of other medical co-morbidities.  RTC with Dr [name] in 3 weeks with labs   All of the patients and his wife's questions were answered to their apparent satisfaction. They are agreeable with the plan as noted above .The patient knows to call the clinic with any problems, questions or concerns.  I spent 20 minutes counseling the patient face to face. The total time spent in the appointment was 25 minutes and more than 50% was on counseling and direct patient cares.    Sullivan Lone MD [place] AAHIVMS [facility] Hematology/Oncology Physician [facility]  (Office):       [phone] (Work cell):  [phone] (Fax):           [phone]

## 2017-01-10 LAB — KAPPA/LAMBDA LIGHT CHAINS
IG KAPPA FREE LIGHT CHAIN: 13.6 mg/L (ref 3.3–19.4)
IG LAMBDA FREE LIGHT CHAIN: 9.6 mg/L (ref 5.7–26.3)
KAPPA/LAMBDA FLC RATIO: 1.42 (ref 0.26–1.65)

## 2017-01-11 ENCOUNTER — Telehealth: Payer: Self-pay | Admitting: *Deleted

## 2017-01-11 DIAGNOSIS — I10 Essential (primary) hypertension: Secondary | ICD-10-CM | POA: Diagnosis not present

## 2017-01-11 DIAGNOSIS — I350 Nonrheumatic aortic (valve) stenosis: Secondary | ICD-10-CM | POA: Diagnosis not present

## 2017-01-11 DIAGNOSIS — N2889 Other specified disorders of kidney and ureter: Secondary | ICD-10-CM | POA: Diagnosis not present

## 2017-01-11 DIAGNOSIS — C9000 Multiple myeloma not having achieved remission: Secondary | ICD-10-CM | POA: Diagnosis not present

## 2017-01-11 DIAGNOSIS — E785 Hyperlipidemia, unspecified: Secondary | ICD-10-CM | POA: Diagnosis not present

## 2017-01-11 DIAGNOSIS — G4733 Obstructive sleep apnea (adult) (pediatric): Secondary | ICD-10-CM | POA: Diagnosis not present

## 2017-01-11 DIAGNOSIS — H2513 Age-related nuclear cataract, bilateral: Secondary | ICD-10-CM | POA: Diagnosis not present

## 2017-01-11 DIAGNOSIS — I2510 Atherosclerotic heart disease of native coronary artery without angina pectoris: Secondary | ICD-10-CM | POA: Diagnosis not present

## 2017-01-11 DIAGNOSIS — G40909 Epilepsy, unspecified, not intractable, without status epilepticus: Secondary | ICD-10-CM | POA: Diagnosis not present

## 2017-01-11 DIAGNOSIS — E8581 Light chain (AL) amyloidosis: Secondary | ICD-10-CM | POA: Diagnosis not present

## 2017-01-11 DIAGNOSIS — K5730 Diverticulosis of large intestine without perforation or abscess without bleeding: Secondary | ICD-10-CM | POA: Diagnosis not present

## 2017-01-11 NOTE — Telephone Encounter (Signed)
Melanie from [facility] called stating pt has no remaining home visits left.  Threasa Beards has made pt aware of Cancer FITT program at [facility].  12 week program, twice weekly sessions of 2hr classes.  $170 nonbillable.  Pt stated he is agreeable.  Referral will need to be made if MD agrees.  Referral number: [id number].  Message forwarded to Dr. [name].

## 2017-01-12 ENCOUNTER — Telehealth: Payer: Self-pay | Admitting: Pharmacy Technician

## 2017-01-12 NOTE — Telephone Encounter (Signed)
Oral Oncology Patient Advocate Encounter  Was successful in securing patient a $ 7500 grant from [employer] to provide copayment coverage for his Ninlaro.  This will keep the out of pocket expense at $0.   (The initial copay for one month was ~$2300.00)  I have spoken with the patient. I have made arrangements for [facility] to ship the medication to the patient's home for expected delivery on Monday 01/16/2017.   The billing information is as follows and has been shared with [place].   Member ID: [id number] Group ID: [id number] RxBin: [id number] Dates of Eligibility: 09/25/2016 through 12/23/2017  Fabio Asa. Melynda Keller, [place] [facility] [place] [phone] 01/12/2017 9:51 AM

## 2017-01-13 ENCOUNTER — Telehealth: Payer: Self-pay | Admitting: *Deleted

## 2017-01-13 NOTE — Telephone Encounter (Signed)
Called [facility] to refer pt to Cancer FITT program.  [place], pt information provided.  Melissa stated she would contact pt to set up apt.  [phone]

## 2017-01-13 NOTE — Telephone Encounter (Signed)
Pt called to inquire if ok to have teeth cleaned.  Per Dr. [name], ok to have teeth cleaned.  Pt will need to talk to dentist about doing abx prophylaxis prior to procedure.  Left voice message with patient to inform him of this.

## 2017-01-16 DIAGNOSIS — I1310 Hypertensive heart and chronic kidney disease without heart failure, with stage 1 through stage 4 chronic kidney disease, or unspecified chronic kidney disease: Secondary | ICD-10-CM | POA: Diagnosis not present

## 2017-01-16 DIAGNOSIS — I2510 Atherosclerotic heart disease of native coronary artery without angina pectoris: Secondary | ICD-10-CM | POA: Diagnosis not present

## 2017-01-16 DIAGNOSIS — Z87891 Personal history of nicotine dependence: Secondary | ICD-10-CM | POA: Diagnosis not present

## 2017-01-16 DIAGNOSIS — E8581 Light chain (AL) amyloidosis: Secondary | ICD-10-CM | POA: Diagnosis not present

## 2017-01-16 DIAGNOSIS — E785 Hyperlipidemia, unspecified: Secondary | ICD-10-CM | POA: Diagnosis not present

## 2017-01-16 DIAGNOSIS — N189 Chronic kidney disease, unspecified: Secondary | ICD-10-CM | POA: Diagnosis not present

## 2017-01-16 DIAGNOSIS — Z888 Allergy status to other drugs, medicaments and biological substances status: Secondary | ICD-10-CM | POA: Diagnosis not present

## 2017-01-16 DIAGNOSIS — Z79899 Other long term (current) drug therapy: Secondary | ICD-10-CM | POA: Diagnosis not present

## 2017-01-16 DIAGNOSIS — Z7982 Long term (current) use of aspirin: Secondary | ICD-10-CM | POA: Diagnosis not present

## 2017-01-16 DIAGNOSIS — E8589 Other amyloidosis: Secondary | ICD-10-CM | POA: Diagnosis not present

## 2017-01-18 DIAGNOSIS — Z6828 Body mass index (BMI) 28.0-28.9, adult: Secondary | ICD-10-CM | POA: Diagnosis not present

## 2017-01-18 DIAGNOSIS — N049 Nephrotic syndrome with unspecified morphologic changes: Secondary | ICD-10-CM | POA: Diagnosis not present

## 2017-01-18 DIAGNOSIS — L299 Pruritus, unspecified: Secondary | ICD-10-CM | POA: Diagnosis not present

## 2017-01-18 DIAGNOSIS — E8581 Light chain (AL) amyloidosis: Secondary | ICD-10-CM | POA: Diagnosis not present

## 2017-01-20 ENCOUNTER — Other Ambulatory Visit: Payer: Self-pay

## 2017-01-23 ENCOUNTER — Encounter: Payer: Self-pay | Admitting: *Deleted

## 2017-01-25 NOTE — Progress Notes (Signed)
Alex [name]    HEMATOLOGY/ONCOLOGY CLINIC NOTE  Date of Service: 01/30/17   Patient Care Team: [name], MD as PCP - General (Internal Medicine) Alex Genera, MD as Consulting Physician (Hematology) Nephrology - Dr Madelon Lips MD Dr Henrene Pastor MD - GI  CHIEF COMPLAINTS/PURPOSE OF CONSULTATION:  AL Amyloidosis -with Nephrotic syndrome . No overt CHF pEF  HISTORY OF PRESENTING ILLNESS:   Alex [name]. is a wonderful 76 y.o. male who has been referred to [place] by Dr .[name], MD /Dr Madelon Lips MD for evaluation and management of newly diagnosed Kidney AL Amyloidosis with nephrotic syndrome.  Patient has a history of hypertension, dyslipidemia, coronary artery disease, seizure disorder on Dilantin who was apparently in his usual state of health until 3 months ago when he started developing new onset lower extremity edema. Patient had a UA at the time that showed 4+ protein in 24-hour collection revealed 15 g of protein. Additional workup showed a creatinine of 0.8 with an albumin of 2.3 and negative SPEP and UPEP. Apparently had an elevated K/L SFLC ratio of 5.19.  He was urgently referred by his primary care physician to nephrology for additional evaluation. Patient notes no bleeding issues. No nosebleeds. No periorbital bleeding. No abnormal skin rashes. Has had significant NSAID exposure and use to take a fair amount of naproxen for chronic low back pain but has cut down on this.  Due to lack of clear etiology for his nephrotic syndrome the patient underwent a kidney biopsy on 08/06/2015 accession [id number] which showed AL amyloidosis, lambda immunophenotype, [place] red positive. Noted to have severe arteriosclerosis with moderate tubular interstitial scarring.  Patient was referred to [place] for further evaluation and treatment of his AL Amyloidosis, Patient notes that his leg swelling has improved with diuretic therapy.  He notes no weight loss. No night sweats. No  focal bone pains. No skin rashes. Lungs normal bleeding or bruising.  Patient notes that he had a lot of reading online and he and his wife had an extensive list of questions which were answered in detail.  He notes he has had some chronic issues with upper abdominal pain and nausea and that he follows with Dr. Henrene Pastor and is apparently being scheduled for an EGD and colonoscopy according to his report.  Denies having and lacks tongue. No chest pain and no overt new shortness of breath. Patient is uncertain if an echocardiogram has been ordered or scheduled.  INTERVAL HISTORY  [name] is here for a scheduled follow-up for his AL amyloidosis. Pt presents to the office today accompanied by his wife. He reports that he is doing well overall. He reports that he was evaluated by Dr. Norma Fredrickson who advised him not to start the Ixazomib until after he decides whether he would opt for a transplant. He states that he can no longer hear out of his right ear. He has f/u w/ ENT for this in the near future. He states he is still going to outpatient rehab and it has helped him be more active. His taste has continued to improve and return and his wife states that he has been eating better as well. His wife expressed concern about pt getting the flu shot. He reports that he is remaining active overall by walking and is eating better.   On review of systems, pt reports fever, soreness, abdominal pain, changes in hearing of the right ear, and denies loss of appetite, CP, SOB, or any other acute complaints.  MEDICAL HISTORY:  Past Medical History:  Diagnosis Date  . Amyloidosis ([place])   . Arthritis   . CAD (coronary artery disease)    Lexiscan Myoview 6/14: Normal study, no scar or ischemia, EF 62%  . Colon polyp   . Coronary artery disease    moderate disease by cath 2011  . Diverticulosis of colon   . GERD (gastroesophageal reflux disease)   . Hearing loss   . Hiatal hernia   . Hyperlipidemia   .  Hypertension   . MRSA (methicillin resistant staph aureus) culture positive   . Seizure disorder ([place])   . Seizures ([place])    last in 1970s  Obstructive sleep apnea Gastroesophageal reflux disease Aortic stenosis Erectile dysfunction Peripheral neuropathy Seizure disorder   SURGICAL HISTORY: Past Surgical History:  Procedure Laterality Date  . BUNIONECTOMY    . CARPAL TUNNEL RELEASE  1994   left  . CERVICAL FUSION    . compression fracture    . FOOT SURGERY     right -twice, left foot once  . HERNIA REPAIR     Umbilical with PVP  . KNEE ARTHROSCOPY     left knee  . [place]   left - scope  . NASAL SEPTUM SURGERY    . NOSE SURGERY     twice  . ROTATOR CUFF REPAIR     twice, both shoulders  . ROTATOR CUFF REPAIR    . SPINE SURGERY     C2, C3, C4  . TRIGGER FINGER RELEASE     both hands, twice  . TRIGGER FINGER RELEASE    . VASECTOMY      SOCIAL HISTORY: Social History   Social History  . Marital status: Married    Spouse name: N/A  . Number of children: 2  . Years of education: 12   Occupational History  . Retired    Social History Main Topics  . Smoking status: Former Smoker    Quit date: 04/25/1969  . Smokeless tobacco: Former Systems developer  . Alcohol use 4.2 - 8.4 oz/week    7 - 14 Glasses of wine per week     Comment: socially shots of wiskey  . Drug use: No  . Sexual activity: Not on file   Other Topics Concern  . Not on file   Social History Narrative   ** Merged History Encounter **   Lives at home w/ his wife   Right-handed   Caffeine: occasional Pepsi      Patient is currently retired.   FAMILY HISTORY: Family History  Problem Relation Age of Onset  . Diabetes Father   . Heart disease Father   . Heart attack Father 47  . Hypertension Father   . Hyperlipidemia Unknown   . Diabetes Unknown   . Hypertension Unknown   . Seizures Unknown   . Thyroid disease Unknown   . Colon cancer Neg Hx   . Stroke Neg Hx     ALLERGIES:  is  allergic to ramipril.  MEDICATIONS:  Current Outpatient Prescriptions  Medication Sig Dispense Refill  . aspirin EC 81 MG tablet Take 81 mg by mouth daily.    Alex [name] atorvastatin (LIPITOR) 40 MG tablet Take 40 mg by mouth at bedtime.     . cholecalciferol (VITAMIN D) 1000 units tablet Take 1,000 Units by mouth daily.    . clindamycin (CLEOCIN-T) 1 % external solution Apply topically 2 (two) times daily. (Patient taking differently: Apply 1 application topically 2 (two)  times daily as needed (for rash). ) 60 mL 0  . erythromycin ophthalmic ointment Place 1 application into both eyes at bedtime. Please apply to red area over eyelid twice daily and into the affected eye at bed time 3.5 g 1  . ezetimibe (ZETIA) 10 MG tablet Take 10 mg by mouth daily.     Alex [name] HYDROcodone-acetaminophen (NORCO/VICODIN) 5-325 MG tablet Take 1 tablet by mouth every 4 (four) hours as needed for moderate pain. 30 tablet 0  . isosorbide mononitrate (IMDUR) 30 MG 24 hr tablet TAKE 1/2 TABLET DAILY 45 tablet 0  . mupirocin cream (BACTROBAN) 2 % Apply 1 application topically 2 (two) times daily. (Patient taking differently: Apply 1 application topically 2 (two) times daily as needed (for rash/irritation). ) 30 g 0  . nitroGLYCERIN (NITROSTAT) 0.4 MG SL tablet Place 1 tablet (0.4 mg total) under the tongue every 5 (five) minutes as needed for chest pain. 25 tablet 6  . omeprazole (PRILOSEC) 20 MG capsule Take 20 mg by mouth daily.      . ondansetron (ZOFRAN) 8 MG tablet Take 1 tablet (8 mg total) by mouth 2 (two) times daily as needed for refractory nausea / vomiting. Start on D 4 and 11. Do not take on D8. 30 tablet 1  . prochlorperazine (COMPAZINE) 10 MG tablet Take 1 tablet (10 mg total) by mouth every 6 (six) hours as needed (Nausea or vomiting). 30 tablet 1  . triamcinolone cream (KENALOG) 0.1 % Apply 1 application topically 2 (two) times daily as needed (for skin irritation).     Alex [name] acyclovir (ZOVIRAX) 400 MG tablet Take 1 tablet  (400 mg total) by mouth 2 (two) times daily. (Patient not taking: Reported on 01/30/2017) 60 tablet 3  . ixazomib citrate (NINLARO) 4 MG capsule Take 1 capsule (4 mg total) by mouth once a week. On D1, D8 and D15 every 28 days.Take on an empty stomach 1hr before or 2hrs after food. (Patient not taking: Reported on 01/30/2017) 3 capsule 3  . magic mouthwash w/lidocaine SOLN Take 5 mLs by mouth 4 (four) times daily as needed for mouth pain. 1 part viscous lidocaine 2%; 1 part maalox; 1 part diphenhydramine 12.46m per 587melixir (Patient not taking: Reported on 01/30/2017) 200 mL 0  . potassium chloride SA (K-DUR,KLOR-CON) 20 MEQ tablet Take 20 mEq by mouth at bedtime.      No current facility-administered medications for this visit.     REVIEW OF SYSTEMS:    10 Point review of Systems was done is negative except as noted above.  PHYSICAL EXAMINATION: ECOG PERFORMANCE STATUS: 1 - Symptomatic but completely ambulatory  . Vitals:   01/30/17 1349  BP: 131/79  Pulse: 71  Resp: 18  Temp: 99.1 F (37.3 C)  SpO2: 98%   Filed Weights   01/30/17 1349  Weight: 189 lb 1.6 oz (85.8 kg)   .Body mass index is 26.37 kg/m.  GENERAL:alert, in no acute distress and comfortable SKIN: no acute rashes, no significant lesions EYES: conjunctiva are pink and non-injected, sclera anicteric OROPHARYNX: MMM, no exudates, no oropharyngeal erythema or ulceration, no macroglossia NECK: supple, mild JVD LYMPH:  no palpable lymphadenopathy in the cervical, axillary or inguinal regions LUNGS: clear to auscultation b/l with normal respiratory effort HEART: regular rate & rhythm ABDOMEN:  normoactive bowel sounds , non tender, not distended. Extremity: No pedal edema notedPSYCH: alert & oriented x 3 with fluent speech NEURO: no focal motor/sensory deficits  LABORATORY DATA:  I have reviewed  the data as listed  . CBC Latest Ref Rng & Units 01/30/2017 01/09/2017 12/19/2016  WBC 4.0 - 10.3 10e3/uL 8.7 5.8 5.5    Hemoglobin 13.0 - 17.1 g/dL 11.8(L) 10.8(L) 11.1(L)  Hematocrit 38.4 - 49.9 % 34.5(L) 31.9(L) 32.2(L)  Platelets 140 - 400 10e3/uL 126(L) 126(L) 101(L)    CMP Latest Ref Rng & Units 01/30/2017 01/09/2017 12/19/2016  Glucose 70 - 140 mg/dl 111 119 140  BUN 7.0 - 26.0 mg/dL 20.0 13.8 22.8  Creatinine 0.7 - 1.3 mg/dL 1.2 1.1 1.0  Sodium 136 - 145 mEq/L 142 142 140  Potassium 3.5 - 5.1 mEq/L 3.7 3.8 3.6  Chloride 101 - 111 mmol/L - - -  CO2 22 - 29 mEq/L '26 23 24  ' Calcium 8.4 - 10.4 mg/dL 9.1 8.9 9.3  Total Protein 6.4 - 8.3 g/dL 5.6(L) 5.4(L) 5.7(L)  Total Bilirubin 0.20 - 1.20 mg/dL 0.68 0.72 0.57  Alkaline Phos 40 - 150 U/L 61 70 71  AST 5 - 34 U/L '25 21 27  ' ALT 0 - 55 U/L '17 15 20        ' Are you good       RADIOGRAPHIC STUDIES: I have personally reviewed the radiological images as listed and agreed with the findings in the report. No results found. Result status: Edited Result - FINAL                              *[facility] [employer].                        [place], [place] [address]                            [phone]  ------------------------------------------------------------------- Transthoracic Echocardiography  (Report amended )  Patient:    Barbara, Keng MR #:       [id number] Study Date: 08/24/2016 Gender:     M Age:        69 Height:     180.3 cm Weight:     90.6 kg BSA:        2.15 m^2 Pt. Status: Room:   ATTENDING    Darlina Guys, MD  Willia Craze, Christopher  REFERRING    [place], [place], Outpatient  SONOGRAPHER  Roseanna Rainbow  cc:  ------------------------------------------------------------------- LV EF: 65% -   70%  ------------------------------------------------------------------- Indications:      Aortic stenosis 424.1.  ------------------------------------------------------------------- History:    PMH:  Amyloidosis.  Coronary artery disease.  Angina pectoris.  Risk factors:  Hypertension. Dyslipidemia.  ------------------------------------------------------------------- Study Conclusions  - Left ventricle: The cavity size was normal. Wall thickness was   increased in a pattern of mild LVH. Systolic function was   vigorous. The estimated ejection fraction was in the range of 65%   to 70%. Wall motion was normal; there were no regional wall   motion abnormalities. Doppler parameters are consistent with   abnormal left ventricular relaxation (grade 1 diastolic   dysfunction). - Aortic valve: Valve mobility was mildly restricted. - Aortic root: The  aortic root was mildly dilated. - Mitral valve: Calcified annulus.  Impressions:  - Vigorous LV systolic function; mild diastolic dysfunction; mild   LVH; calcified aortic valve with no significant AS (peak velocity   2 m/s; mean gradient 9 mmHg); mildly dilated aortic root.    ASSESSMENT & PLAN:   76 year old male with above-mentioned multiple medical comorbidities with  #1 Biopsy proven Renal and Systemic AL Amyloidosis BM Bx shows 9% plasma cells and amyloid deposits. Echo shows normal systolic function with only grade 1 diastolic dysfunction. Skeletal survey with no lytic lesions suggestive of multiple myeloma. Serum K/L ratio increased at about 6.22 on diagnosis and has now improved to 1.5 (wnl with normal kappa lambda serum free light chain ratio ) UPEP shows 24h protein improved from 15g to 5g daily and the last recent one is about 7.3 g /24h  #2  Nephrotic Syndrome - likely related to AL amyloidosis.  UPEP shows 24h protein improved from 15g to 5g daily and the last recent one is about 7.3 g /24h  Plan --Blood counts stable. SFLC suggest patient continues to be in hematologic remission. -had appointment for transplant evaluation on 01/23/2017 with Dr. Norma [facility]rickson at [facility], he is a candidate  but they are currently deciding between the transplant + maintenance treatment vs maintenance treatment only. -he has a f/u with Dr Norma Fredrickson mid Oct 2018 to finalize his decision and candidacy. -If he does not proceed with transplant he would consider start maintainence treatment with Ixazomib. (has the medication already but is holding it pending final transplant decision). -Continue cancer rehab to optimize functional status. -continue follow-up with nutritional therapy. - improved po intake.  -discussed how we will start this maintenance if pt opts out of transplant or after transplant -discussed and answered questions about side affects of transplant and high-dose chemotherapy with the patient and his wife   #3 Grade 1 fatigue - improving -Continue home physical therapy to optimize functional status. -continue follow-up with nutritional therapy.   #4. Seizure disorder in her the patient has been on Dilantin from one 10 years. Patient knows that he has not had a seizure for very long time. -continue on Keppra  #5 Blepharitis with Stye - resolved  #6 Patient Active Problem List   Diagnosis Date Noted  . Folliculitis 47/82/9562  . AL amyloidosis ([place]) 08/21/2016  . Umbilical hernia 13/11/6576  . DIZZINESS 02/19/2010  . CAD, NATIVE VESSEL 07/21/2009  . CHEST PAIN-UNSPECIFIED 07/21/2009  . HYPERLIPIDEMIA 04/30/2007  . HYPERTENSION 04/30/2007  . GASTROESOPHAGEAL REFLUX DISEASE 04/30/2007  . SEIZURE DISORDER 04/30/2007  . COMPRESSION FRACTURE, L1 VERTEBRA 04/30/2007  . DIVERTICULOSIS, COLON, HX OF 04/30/2007  . Other postprocedural status(V45.89) 04/30/2007  -Continue follow-up with primary care physician for management of other medical co-morbidities.  RTC with Dr [name] in 3 months with labs assuming pt will opt for transplant If pt opts out of transplant will see him back 2-3 weeks after starting Maintenance Ninlaro (for a toxicity check)  All of the patients and his wife's  questions were answered to their apparent satisfaction. They are agreeable with the plan as noted above .The patient knows to call the clinic with any problems, questions or concerns.  I spent 20 minutes counseling the patient face to face. The total time spent in the appointment was 25 minutes and more than 50% was on counseling and direct patient cares.    Sullivan Lone MD MS AAHIVMS [facility] Hematology/Oncology Physician [facility]  (  Office):       [phone] (Work cell):  [phone] (Fax):           [phone]  This document serves as a record of services personally performed by Sullivan Lone, MD. It was created on her behalf by Alean Rinne, a trained medical scribe. The creation of this record is based on the scribe's personal observations and the provider's statements to them. This document has been checked and approved by the attending provider.

## 2017-01-30 ENCOUNTER — Ambulatory Visit (HOSPITAL_BASED_OUTPATIENT_CLINIC_OR_DEPARTMENT_OTHER): Payer: Medicare HMO | Admitting: Hematology

## 2017-01-30 ENCOUNTER — Other Ambulatory Visit (HOSPITAL_BASED_OUTPATIENT_CLINIC_OR_DEPARTMENT_OTHER): Payer: Medicare HMO

## 2017-01-30 ENCOUNTER — Telehealth: Payer: Self-pay | Admitting: Hematology

## 2017-01-30 ENCOUNTER — Encounter: Payer: Self-pay | Admitting: Hematology

## 2017-01-30 VITALS — BP 131/79 | HR 71 | Temp 99.1°F | Resp 18 | Ht 71.0 in | Wt 189.1 lb

## 2017-01-30 DIAGNOSIS — N08 Glomerular disorders in diseases classified elsewhere: Secondary | ICD-10-CM

## 2017-01-30 DIAGNOSIS — E854 Organ-limited amyloidosis: Secondary | ICD-10-CM | POA: Diagnosis not present

## 2017-01-30 DIAGNOSIS — E8581 Light chain (AL) amyloidosis: Secondary | ICD-10-CM

## 2017-01-30 DIAGNOSIS — D696 Thrombocytopenia, unspecified: Secondary | ICD-10-CM

## 2017-01-30 LAB — COMPREHENSIVE METABOLIC PANEL
ALBUMIN: 2.9 g/dL — AB (ref 3.5–5.0)
ALK PHOS: 61 U/L (ref 40–150)
ALT: 17 U/L (ref 0–55)
AST: 25 U/L (ref 5–34)
Anion Gap: 7 mEq/L (ref 3–11)
BILIRUBIN TOTAL: 0.68 mg/dL (ref 0.20–1.20)
BUN: 20 mg/dL (ref 7.0–26.0)
CO2: 26 meq/L (ref 22–29)
CREATININE: 1.2 mg/dL (ref 0.7–1.3)
Calcium: 9.1 mg/dL (ref 8.4–10.4)
Chloride: 109 mEq/L (ref 98–109)
EGFR: 57 mL/min/{1.73_m2} — ABNORMAL LOW (ref >90)
GLUCOSE: 111 mg/dL (ref 70–140)
Potassium: 3.7 mEq/L (ref 3.5–5.1)
SODIUM: 142 meq/L (ref 136–145)
TOTAL PROTEIN: 5.6 g/dL — AB (ref 6.4–8.3)

## 2017-01-30 LAB — CBC & DIFF AND RETIC
BASO%: 0.2 % (ref 0.0–2.0)
Basophils Absolute: 0 10*3/uL (ref 0.0–0.1)
EOS ABS: 0.3 10*3/uL (ref 0.0–0.5)
EOS%: 2.9 % (ref 0.0–7.0)
HCT: 34.5 % — ABNORMAL LOW (ref 38.4–49.9)
HEMOGLOBIN: 11.8 g/dL — AB (ref 13.0–17.1)
IMMATURE RETIC FRACT: 11.2 % — AB (ref 3.00–10.60)
LYMPH#: 1 10*3/uL (ref 0.9–3.3)
LYMPH%: 11.3 % — AB (ref 14.0–49.0)
MCH: 33.8 pg — ABNORMAL HIGH (ref 27.2–33.4)
MCHC: 34.2 g/dL (ref 32.0–36.0)
MCV: 98.9 fL — AB (ref 79.3–98.0)
MONO#: 0.8 10*3/uL (ref 0.1–0.9)
MONO%: 9.5 % (ref 0.0–14.0)
NEUT%: 76.1 % — ABNORMAL HIGH (ref 39.0–75.0)
NEUTROS ABS: 6.6 10*3/uL — AB (ref 1.5–6.5)
Platelets: 126 10*3/uL — ABNORMAL LOW (ref 140–400)
RBC: 3.49 10*6/uL — AB (ref 4.20–5.82)
RDW: 13.9 % (ref 11.0–14.6)
RETIC %: 2.63 % — AB (ref 0.80–1.80)
RETIC CT ABS: 91.79 10*3/uL (ref 34.80–93.90)
WBC: 8.7 10*3/uL (ref 4.0–10.3)

## 2017-01-30 NOTE — Telephone Encounter (Signed)
Gave avs and calendar for January 2019 °

## 2017-01-31 LAB — KAPPA/LAMBDA LIGHT CHAINS
IG KAPPA FREE LIGHT CHAIN: 16.7 mg/L (ref 3.3–19.4)
IG LAMBDA FREE LIGHT CHAIN: 10.5 mg/L (ref 5.7–26.3)
KAPPA/LAMBDA FLC RATIO: 1.59 (ref 0.26–1.65)

## 2017-02-02 LAB — MULTIPLE MYELOMA PANEL, SERUM
ALBUMIN SERPL ELPH-MCNC: 2.7 g/dL — AB (ref 2.9–4.4)
ALBUMIN/GLOB SERPL: 1.2 (ref 0.7–1.7)
ALPHA 1: 0.3 g/dL (ref 0.0–0.4)
Alpha2 Glob SerPl Elph-Mcnc: 1 g/dL (ref 0.4–1.0)
B-Globulin SerPl Elph-Mcnc: 0.9 g/dL (ref 0.7–1.3)
Gamma Glob SerPl Elph-Mcnc: 0.3 g/dL — ABNORMAL LOW (ref 0.4–1.8)
Globulin, Total: 2.4 g/dL (ref 2.2–3.9)
IGG (IMMUNOGLOBIN G), SERUM: 267 mg/dL — AB (ref 700–1600)
IGM (IMMUNOGLOBIN M), SRM: 20 mg/dL (ref 15–143)
IgA, Qn, Serum: 53 mg/dL — ABNORMAL LOW (ref 61–437)
TOTAL PROTEIN: 5.1 g/dL — AB (ref 6.0–8.5)

## 2017-02-06 DIAGNOSIS — H9113 Presbycusis, bilateral: Secondary | ICD-10-CM | POA: Diagnosis not present

## 2017-02-06 DIAGNOSIS — H6981 Other specified disorders of Eustachian tube, right ear: Secondary | ICD-10-CM | POA: Insufficient documentation

## 2017-02-06 DIAGNOSIS — H903 Sensorineural hearing loss, bilateral: Secondary | ICD-10-CM | POA: Diagnosis not present

## 2017-02-06 DIAGNOSIS — H6991 Unspecified Eustachian tube disorder, right ear: Secondary | ICD-10-CM | POA: Insufficient documentation

## 2017-02-07 ENCOUNTER — Other Ambulatory Visit: Payer: Self-pay | Admitting: Cardiovascular Disease

## 2017-02-13 DIAGNOSIS — R630 Anorexia: Secondary | ICD-10-CM | POA: Diagnosis not present

## 2017-02-13 DIAGNOSIS — R531 Weakness: Secondary | ICD-10-CM | POA: Diagnosis not present

## 2017-02-13 DIAGNOSIS — G629 Polyneuropathy, unspecified: Secondary | ICD-10-CM | POA: Diagnosis not present

## 2017-02-13 DIAGNOSIS — E8589 Other amyloidosis: Secondary | ICD-10-CM | POA: Diagnosis not present

## 2017-02-13 DIAGNOSIS — K5730 Diverticulosis of large intestine without perforation or abscess without bleeding: Secondary | ICD-10-CM | POA: Diagnosis not present

## 2017-02-13 DIAGNOSIS — Z01818 Encounter for other preprocedural examination: Secondary | ICD-10-CM | POA: Diagnosis not present

## 2017-02-13 DIAGNOSIS — I350 Nonrheumatic aortic (valve) stenosis: Secondary | ICD-10-CM | POA: Diagnosis not present

## 2017-02-13 DIAGNOSIS — I38 Endocarditis, valve unspecified: Secondary | ICD-10-CM | POA: Diagnosis not present

## 2017-02-13 DIAGNOSIS — E8581 Light chain (AL) amyloidosis: Secondary | ICD-10-CM | POA: Diagnosis not present

## 2017-02-13 DIAGNOSIS — I517 Cardiomegaly: Secondary | ICD-10-CM | POA: Diagnosis not present

## 2017-02-13 DIAGNOSIS — I700 Atherosclerosis of aorta: Secondary | ICD-10-CM | POA: Diagnosis not present

## 2017-02-23 ENCOUNTER — Other Ambulatory Visit: Payer: Self-pay | Admitting: *Deleted

## 2017-02-23 DIAGNOSIS — K119 Disease of salivary gland, unspecified: Secondary | ICD-10-CM | POA: Diagnosis not present

## 2017-02-23 DIAGNOSIS — E8581 Light chain (AL) amyloidosis: Secondary | ICD-10-CM

## 2017-02-23 DIAGNOSIS — K118 Other diseases of salivary glands: Secondary | ICD-10-CM | POA: Insufficient documentation

## 2017-02-23 DIAGNOSIS — R21 Rash and other nonspecific skin eruption: Secondary | ICD-10-CM | POA: Diagnosis not present

## 2017-02-23 DIAGNOSIS — Z01818 Encounter for other preprocedural examination: Secondary | ICD-10-CM | POA: Diagnosis not present

## 2017-02-23 DIAGNOSIS — L300 Nummular dermatitis: Secondary | ICD-10-CM | POA: Diagnosis not present

## 2017-02-23 DIAGNOSIS — R220 Localized swelling, mass and lump, head: Secondary | ICD-10-CM | POA: Diagnosis not present

## 2017-02-23 DIAGNOSIS — Z87891 Personal history of nicotine dependence: Secondary | ICD-10-CM | POA: Diagnosis not present

## 2017-02-23 MED ORDER — ACYCLOVIR 400 MG PO TABS: 400.0000 mg | ORAL_TABLET | Freq: Two times a day (BID) | ORAL | 3 refills | Status: DC

## 2017-02-28 DIAGNOSIS — K119 Disease of salivary gland, unspecified: Secondary | ICD-10-CM | POA: Diagnosis not present

## 2017-02-28 DIAGNOSIS — K118 Other diseases of salivary glands: Secondary | ICD-10-CM | POA: Diagnosis not present

## 2017-02-28 DIAGNOSIS — R221 Localized swelling, mass and lump, neck: Secondary | ICD-10-CM | POA: Diagnosis not present

## 2017-03-06 DIAGNOSIS — Z52011 Autologous donor, stem cells: Secondary | ICD-10-CM | POA: Diagnosis not present

## 2017-03-06 DIAGNOSIS — E8581 Light chain (AL) amyloidosis: Secondary | ICD-10-CM | POA: Diagnosis not present

## 2017-03-06 DIAGNOSIS — K119 Disease of salivary gland, unspecified: Secondary | ICD-10-CM | POA: Diagnosis not present

## 2017-03-06 DIAGNOSIS — L299 Pruritus, unspecified: Secondary | ICD-10-CM | POA: Diagnosis not present

## 2017-03-08 DIAGNOSIS — G4733 Obstructive sleep apnea (adult) (pediatric): Secondary | ICD-10-CM | POA: Insufficient documentation

## 2017-03-08 DIAGNOSIS — H9190 Unspecified hearing loss, unspecified ear: Secondary | ICD-10-CM | POA: Insufficient documentation

## 2017-03-08 DIAGNOSIS — I350 Nonrheumatic aortic (valve) stenosis: Secondary | ICD-10-CM | POA: Insufficient documentation

## 2017-03-09 DIAGNOSIS — K219 Gastro-esophageal reflux disease without esophagitis: Secondary | ICD-10-CM | POA: Diagnosis not present

## 2017-03-09 DIAGNOSIS — E859 Amyloidosis, unspecified: Secondary | ICD-10-CM | POA: Diagnosis not present

## 2017-03-09 DIAGNOSIS — N182 Chronic kidney disease, stage 2 (mild): Secondary | ICD-10-CM | POA: Diagnosis not present

## 2017-03-09 DIAGNOSIS — H9190 Unspecified hearing loss, unspecified ear: Secondary | ICD-10-CM | POA: Diagnosis not present

## 2017-03-09 DIAGNOSIS — G4733 Obstructive sleep apnea (adult) (pediatric): Secondary | ICD-10-CM | POA: Diagnosis not present

## 2017-03-09 DIAGNOSIS — I2510 Atherosclerotic heart disease of native coronary artery without angina pectoris: Secondary | ICD-10-CM | POA: Diagnosis not present

## 2017-03-09 DIAGNOSIS — Z9481 Bone marrow transplant status: Secondary | ICD-10-CM | POA: Diagnosis not present

## 2017-03-09 DIAGNOSIS — D110 Benign neoplasm of parotid gland: Secondary | ICD-10-CM | POA: Diagnosis not present

## 2017-03-09 DIAGNOSIS — D37030 Neoplasm of uncertain behavior of the parotid salivary glands: Secondary | ICD-10-CM | POA: Diagnosis not present

## 2017-03-09 DIAGNOSIS — R569 Unspecified convulsions: Secondary | ICD-10-CM | POA: Diagnosis not present

## 2017-03-09 DIAGNOSIS — I129 Hypertensive chronic kidney disease with stage 1 through stage 4 chronic kidney disease, or unspecified chronic kidney disease: Secondary | ICD-10-CM | POA: Diagnosis not present

## 2017-03-09 DIAGNOSIS — E785 Hyperlipidemia, unspecified: Secondary | ICD-10-CM | POA: Diagnosis not present

## 2017-03-09 HISTORY — PX: PAROTIDECTOMY: SUR1003

## 2017-03-15 DIAGNOSIS — Z9889 Other specified postprocedural states: Secondary | ICD-10-CM | POA: Diagnosis not present

## 2017-03-15 DIAGNOSIS — Z4803 Encounter for change or removal of drains: Secondary | ICD-10-CM | POA: Diagnosis not present

## 2017-03-15 DIAGNOSIS — Z9049 Acquired absence of other specified parts of digestive tract: Secondary | ICD-10-CM | POA: Diagnosis not present

## 2017-03-20 ENCOUNTER — Other Ambulatory Visit: Payer: Self-pay | Admitting: *Deleted

## 2017-03-20 DIAGNOSIS — E8581 Light chain (AL) amyloidosis: Secondary | ICD-10-CM

## 2017-03-20 MED ORDER — IXAZOMIB CITRATE 4 MG PO CAPS: 4.0000 mg | ORAL_CAPSULE | ORAL | 3 refills | Status: DC

## 2017-03-25 DIAGNOSIS — Z9484 Stem cells transplant status: Secondary | ICD-10-CM

## 2017-03-25 HISTORY — DX: Stem cells transplant status: Z94.84

## 2017-03-29 DIAGNOSIS — Z52011 Autologous donor, stem cells: Secondary | ICD-10-CM | POA: Diagnosis not present

## 2017-03-29 DIAGNOSIS — E8581 Light chain (AL) amyloidosis: Secondary | ICD-10-CM | POA: Diagnosis not present

## 2017-03-30 DIAGNOSIS — Z52011 Autologous donor, stem cells: Secondary | ICD-10-CM | POA: Diagnosis not present

## 2017-03-30 DIAGNOSIS — E8581 Light chain (AL) amyloidosis: Secondary | ICD-10-CM | POA: Diagnosis not present

## 2017-03-31 DIAGNOSIS — Z52011 Autologous donor, stem cells: Secondary | ICD-10-CM | POA: Diagnosis not present

## 2017-03-31 DIAGNOSIS — E8581 Light chain (AL) amyloidosis: Secondary | ICD-10-CM | POA: Diagnosis not present

## 2017-04-01 DIAGNOSIS — Z52011 Autologous donor, stem cells: Secondary | ICD-10-CM | POA: Diagnosis not present

## 2017-04-01 DIAGNOSIS — E8581 Light chain (AL) amyloidosis: Secondary | ICD-10-CM | POA: Diagnosis not present

## 2017-04-02 DIAGNOSIS — I2510 Atherosclerotic heart disease of native coronary artery without angina pectoris: Secondary | ICD-10-CM | POA: Diagnosis not present

## 2017-04-02 DIAGNOSIS — E876 Hypokalemia: Secondary | ICD-10-CM | POA: Diagnosis not present

## 2017-04-02 DIAGNOSIS — K219 Gastro-esophageal reflux disease without esophagitis: Secondary | ICD-10-CM | POA: Diagnosis not present

## 2017-04-02 DIAGNOSIS — E8581 Light chain (AL) amyloidosis: Secondary | ICD-10-CM | POA: Diagnosis not present

## 2017-04-02 DIAGNOSIS — R110 Nausea: Secondary | ICD-10-CM | POA: Diagnosis not present

## 2017-04-02 DIAGNOSIS — Z79899 Other long term (current) drug therapy: Secondary | ICD-10-CM | POA: Diagnosis not present

## 2017-04-02 DIAGNOSIS — Z52011 Autologous donor, stem cells: Secondary | ICD-10-CM | POA: Diagnosis not present

## 2017-04-02 DIAGNOSIS — E785 Hyperlipidemia, unspecified: Secondary | ICD-10-CM | POA: Diagnosis not present

## 2017-04-02 DIAGNOSIS — Z87891 Personal history of nicotine dependence: Secondary | ICD-10-CM | POA: Diagnosis not present

## 2017-04-03 DIAGNOSIS — E8581 Light chain (AL) amyloidosis: Secondary | ICD-10-CM | POA: Diagnosis not present

## 2017-04-03 DIAGNOSIS — Z9484 Stem cells transplant status: Secondary | ICD-10-CM | POA: Diagnosis not present

## 2017-04-04 ENCOUNTER — Telehealth: Payer: Self-pay | Admitting: *Deleted

## 2017-04-04 NOTE — Telephone Encounter (Signed)
Received VM from pt stating he has "no received medication in mail."  Returned call to patient.  Pt has not received Ninlaro delivery.  Last rx sent to [facility] pharmacy 03/20/17.  Pt stated he has not called pharmacy to ask for delivery.  Advised pt to contact [facility], phone number provided.

## 2017-04-10 DIAGNOSIS — K219 Gastro-esophageal reflux disease without esophagitis: Secondary | ICD-10-CM | POA: Diagnosis not present

## 2017-04-10 DIAGNOSIS — K521 Toxic gastroenteritis and colitis: Secondary | ICD-10-CM | POA: Diagnosis not present

## 2017-04-10 DIAGNOSIS — I2510 Atherosclerotic heart disease of native coronary artery without angina pectoris: Secondary | ICD-10-CM | POA: Diagnosis not present

## 2017-04-10 DIAGNOSIS — B348 Other viral infections of unspecified site: Secondary | ICD-10-CM | POA: Diagnosis not present

## 2017-04-10 DIAGNOSIS — I444 Left anterior fascicular block: Secondary | ICD-10-CM | POA: Diagnosis not present

## 2017-04-10 DIAGNOSIS — I129 Hypertensive chronic kidney disease with stage 1 through stage 4 chronic kidney disease, or unspecified chronic kidney disease: Secondary | ICD-10-CM | POA: Diagnosis not present

## 2017-04-10 DIAGNOSIS — T865 Complications of stem cell transplant: Secondary | ICD-10-CM | POA: Diagnosis not present

## 2017-04-10 DIAGNOSIS — R918 Other nonspecific abnormal finding of lung field: Secondary | ICD-10-CM | POA: Diagnosis not present

## 2017-04-10 DIAGNOSIS — K5900 Constipation, unspecified: Secondary | ICD-10-CM | POA: Diagnosis not present

## 2017-04-10 DIAGNOSIS — Z9484 Stem cells transplant status: Secondary | ICD-10-CM | POA: Insufficient documentation

## 2017-04-10 DIAGNOSIS — R7881 Bacteremia: Secondary | ICD-10-CM | POA: Diagnosis not present

## 2017-04-10 DIAGNOSIS — I130 Hypertensive heart and chronic kidney disease with heart failure and stage 1 through stage 4 chronic kidney disease, or unspecified chronic kidney disease: Secondary | ICD-10-CM | POA: Diagnosis not present

## 2017-04-10 DIAGNOSIS — D720 Genetic anomalies of leukocytes: Secondary | ICD-10-CM | POA: Diagnosis not present

## 2017-04-10 DIAGNOSIS — R140 Abdominal distension (gaseous): Secondary | ICD-10-CM | POA: Diagnosis not present

## 2017-04-10 DIAGNOSIS — D709 Neutropenia, unspecified: Secondary | ICD-10-CM | POA: Diagnosis not present

## 2017-04-10 DIAGNOSIS — R5081 Fever presenting with conditions classified elsewhere: Secondary | ICD-10-CM | POA: Diagnosis not present

## 2017-04-10 DIAGNOSIS — R935 Abnormal findings on diagnostic imaging of other abdominal regions, including retroperitoneum: Secondary | ICD-10-CM | POA: Diagnosis not present

## 2017-04-10 DIAGNOSIS — N183 Chronic kidney disease, stage 3 (moderate): Secondary | ICD-10-CM | POA: Diagnosis not present

## 2017-04-10 DIAGNOSIS — B341 Enterovirus infection, unspecified: Secondary | ICD-10-CM | POA: Diagnosis not present

## 2017-04-10 DIAGNOSIS — R197 Diarrhea, unspecified: Secondary | ICD-10-CM | POA: Diagnosis not present

## 2017-04-10 DIAGNOSIS — R509 Fever, unspecified: Secondary | ICD-10-CM | POA: Diagnosis not present

## 2017-04-10 DIAGNOSIS — E8581 Light chain (AL) amyloidosis: Secondary | ICD-10-CM | POA: Diagnosis not present

## 2017-04-10 DIAGNOSIS — N189 Chronic kidney disease, unspecified: Secondary | ICD-10-CM | POA: Diagnosis not present

## 2017-04-10 DIAGNOSIS — I10 Essential (primary) hypertension: Secondary | ICD-10-CM | POA: Diagnosis not present

## 2017-04-10 DIAGNOSIS — Z452 Encounter for adjustment and management of vascular access device: Secondary | ICD-10-CM | POA: Diagnosis not present

## 2017-04-10 DIAGNOSIS — B9620 Unspecified Escherichia coli [E. coli] as the cause of diseases classified elsewhere: Secondary | ICD-10-CM | POA: Diagnosis not present

## 2017-04-10 DIAGNOSIS — E785 Hyperlipidemia, unspecified: Secondary | ICD-10-CM | POA: Diagnosis not present

## 2017-04-10 DIAGNOSIS — E876 Hypokalemia: Secondary | ICD-10-CM | POA: Diagnosis not present

## 2017-04-12 DIAGNOSIS — I444 Left anterior fascicular block: Secondary | ICD-10-CM | POA: Diagnosis not present

## 2017-04-27 DIAGNOSIS — T865 Complications of stem cell transplant: Secondary | ICD-10-CM | POA: Insufficient documentation

## 2017-04-28 ENCOUNTER — Other Ambulatory Visit: Payer: Self-pay

## 2017-04-28 DIAGNOSIS — E8581 Light chain (AL) amyloidosis: Secondary | ICD-10-CM

## 2017-05-01 ENCOUNTER — Inpatient Hospital Stay: Payer: Medicare HMO | Attending: Hematology

## 2017-05-01 DIAGNOSIS — G4733 Obstructive sleep apnea (adult) (pediatric): Secondary | ICD-10-CM | POA: Insufficient documentation

## 2017-05-01 DIAGNOSIS — N049 Nephrotic syndrome with unspecified morphologic changes: Secondary | ICD-10-CM | POA: Insufficient documentation

## 2017-05-01 DIAGNOSIS — G629 Polyneuropathy, unspecified: Secondary | ICD-10-CM | POA: Insufficient documentation

## 2017-05-01 DIAGNOSIS — Z87891 Personal history of nicotine dependence: Secondary | ICD-10-CM | POA: Diagnosis not present

## 2017-05-01 DIAGNOSIS — E8581 Light chain (AL) amyloidosis: Secondary | ICD-10-CM | POA: Diagnosis present

## 2017-05-01 DIAGNOSIS — I350 Nonrheumatic aortic (valve) stenosis: Secondary | ICD-10-CM | POA: Insufficient documentation

## 2017-05-01 DIAGNOSIS — K5730 Diverticulosis of large intestine without perforation or abscess without bleeding: Secondary | ICD-10-CM | POA: Insufficient documentation

## 2017-05-01 DIAGNOSIS — K449 Diaphragmatic hernia without obstruction or gangrene: Secondary | ICD-10-CM | POA: Insufficient documentation

## 2017-05-01 DIAGNOSIS — R569 Unspecified convulsions: Secondary | ICD-10-CM | POA: Diagnosis not present

## 2017-05-01 DIAGNOSIS — K219 Gastro-esophageal reflux disease without esophagitis: Secondary | ICD-10-CM | POA: Insufficient documentation

## 2017-05-01 DIAGNOSIS — I10 Essential (primary) hypertension: Secondary | ICD-10-CM | POA: Diagnosis not present

## 2017-05-01 DIAGNOSIS — Z79899 Other long term (current) drug therapy: Secondary | ICD-10-CM | POA: Diagnosis not present

## 2017-05-01 DIAGNOSIS — I2510 Atherosclerotic heart disease of native coronary artery without angina pectoris: Secondary | ICD-10-CM | POA: Insufficient documentation

## 2017-05-01 DIAGNOSIS — E785 Hyperlipidemia, unspecified: Secondary | ICD-10-CM | POA: Insufficient documentation

## 2017-05-01 LAB — CBC WITH DIFFERENTIAL/PLATELET
ABS GRANULOCYTE: 7 10*3/uL — AB (ref 1.5–6.5)
Basophils Absolute: 0.1 10*3/uL (ref 0.0–0.1)
Basophils Relative: 1 %
Eosinophils Absolute: 0.1 10*3/uL (ref 0.0–0.5)
Eosinophils Relative: 1 %
HEMATOCRIT: 33.1 % — AB (ref 38.4–49.9)
Hemoglobin: 11.2 g/dL — ABNORMAL LOW (ref 13.0–17.1)
Lymphocytes Relative: 27 %
Lymphs Abs: 3.1 10*3/uL (ref 0.9–3.3)
MCH: 30.4 pg (ref 27.2–33.4)
MCHC: 33.8 g/dL (ref 32.0–36.0)
MCV: 89.9 fL (ref 79.3–98.0)
MONO ABS: 1.1 10*3/uL — AB (ref 0.1–0.9)
MONOS PCT: 10 %
Neutro Abs: 7 10*3/uL — ABNORMAL HIGH (ref 1.5–6.5)
Neutrophils Relative %: 61 %
Platelets: 175 10*3/uL (ref 140–400)
RBC: 3.68 MIL/uL — ABNORMAL LOW (ref 4.20–5.82)
RDW: 14.2 % (ref 11.0–15.6)
WBC: 11.4 10*3/uL — AB (ref 4.0–10.3)

## 2017-05-01 LAB — COMPREHENSIVE METABOLIC PANEL
ALK PHOS: 167 U/L — AB (ref 40–150)
ALT: 31 U/L (ref 0–55)
AST: 27 U/L (ref 5–34)
Albumin: 2.7 g/dL — ABNORMAL LOW (ref 3.5–5.0)
Anion gap: 10 (ref 3–11)
BILIRUBIN TOTAL: 0.4 mg/dL (ref 0.2–1.2)
BUN: 19 mg/dL (ref 7–26)
CALCIUM: 8.8 mg/dL (ref 8.4–10.4)
CO2: 27 mmol/L (ref 22–29)
CREATININE: 1.15 mg/dL (ref 0.70–1.30)
Chloride: 103 mmol/L (ref 98–109)
Glucose, Bld: 99 mg/dL (ref 70–140)
Potassium: 3.2 mmol/L — ABNORMAL LOW (ref 3.5–5.1)
Sodium: 140 mmol/L (ref 136–145)
TOTAL PROTEIN: 6 g/dL — AB (ref 6.4–8.3)

## 2017-05-01 LAB — RETICULOCYTES
RBC.: 3.68 MIL/uL — ABNORMAL LOW (ref 4.20–5.82)
RETIC CT PCT: 3.8 % — AB (ref 0.8–1.8)
Retic Count, Absolute: 139.8 10*3/uL — ABNORMAL HIGH (ref 34.8–93.9)

## 2017-05-01 LAB — MAGNESIUM: Magnesium: 2.1 mg/dL (ref 1.5–2.5)

## 2017-05-02 ENCOUNTER — Inpatient Hospital Stay (HOSPITAL_BASED_OUTPATIENT_CLINIC_OR_DEPARTMENT_OTHER): Payer: Medicare HMO | Admitting: Hematology

## 2017-05-02 ENCOUNTER — Other Ambulatory Visit: Payer: Medicare HMO

## 2017-05-02 ENCOUNTER — Encounter: Payer: Self-pay | Admitting: Hematology

## 2017-05-02 VITALS — BP 122/84 | HR 96 | Resp 18 | Wt 184.6 lb

## 2017-05-02 DIAGNOSIS — I2510 Atherosclerotic heart disease of native coronary artery without angina pectoris: Secondary | ICD-10-CM | POA: Diagnosis not present

## 2017-05-02 DIAGNOSIS — G629 Polyneuropathy, unspecified: Secondary | ICD-10-CM | POA: Diagnosis not present

## 2017-05-02 DIAGNOSIS — I350 Nonrheumatic aortic (valve) stenosis: Secondary | ICD-10-CM | POA: Diagnosis not present

## 2017-05-02 DIAGNOSIS — E8581 Light chain (AL) amyloidosis: Secondary | ICD-10-CM | POA: Diagnosis not present

## 2017-05-02 DIAGNOSIS — N049 Nephrotic syndrome with unspecified morphologic changes: Secondary | ICD-10-CM

## 2017-05-02 DIAGNOSIS — E785 Hyperlipidemia, unspecified: Secondary | ICD-10-CM | POA: Diagnosis not present

## 2017-05-02 DIAGNOSIS — K5730 Diverticulosis of large intestine without perforation or abscess without bleeding: Secondary | ICD-10-CM

## 2017-05-02 DIAGNOSIS — K449 Diaphragmatic hernia without obstruction or gangrene: Secondary | ICD-10-CM | POA: Diagnosis not present

## 2017-05-02 DIAGNOSIS — K219 Gastro-esophageal reflux disease without esophagitis: Secondary | ICD-10-CM | POA: Diagnosis not present

## 2017-05-02 DIAGNOSIS — Z79899 Other long term (current) drug therapy: Secondary | ICD-10-CM

## 2017-05-02 DIAGNOSIS — I10 Essential (primary) hypertension: Secondary | ICD-10-CM | POA: Diagnosis not present

## 2017-05-02 NOTE — Patient Instructions (Signed)
Thank you for choosing [facility] to provide your oncology and hematology care.  To afford each patient quality time with our providers, please arrive 30 minutes before your scheduled appointment time.  If you arrive late for your appointment, you may be asked to reschedule.  We strive to give you quality time with our providers, and arriving late affects you and other patients whose appointments are after yours.   If you are a no show for multiple scheduled visits, you may be dismissed from the clinic at the providers discretion.    Again, thank you for choosing [facility], our hope is that these requests will decrease the amount of time that you wait before being seen by our physicians.  ______________________________________________________________________  Should you have questions after your visit to the  Cancer Center, please contact our office at [phone] between the hours of 8:30 and 4:30 p.m.    Voicemails left after 4:30p.m will not be returned until the following business day.    For prescription refill requests, please have your pharmacy contact us directly.  Please also try to allow 48 hours for prescription requests.    Please contact the scheduling department for questions regarding scheduling.  For scheduling of procedures such as PET scans, CT scans, MRI, Ultrasound, etc please contact central scheduling at [phone].    Resources For Cancer Patients and Caregivers:   Oncolink.org:  A wonderful resource for patients and healthcare providers for information regarding your disease, ways to tract your treatment, what to expect, etc.     American Cancer Society:  800-227-2345  Can help patients locate various types of support and financial assistance  Cancer Care: 1-800-813-HOPE (4673) Provides financial assistance, online support groups, medication/co-pay assistance.    [place] DSS:  336-641-3447 Where to apply for food  stamps, Medicaid, and utility assistance  Medicare Rights Center: 800-333-4114 Helps people with Medicare understand their rights and benefits, navigate the Medicare system, and secure the quality healthcare they deserve  SCAT: 336-333-6589 [place] [employer]'s shared-ride transportation service for eligible riders who have a disability that prevents them from riding the fixed route bus.    For additional information on assistance programs please contact our social worker:   Grier Hock/Abigail Elmore:  [phone]            

## 2017-05-02 NOTE — Progress Notes (Signed)
HEMATOLOGY/ONCOLOGY CLINIC NOTE  Date of Service: 05/02/17   Patient Care Team: [name], Alex [name] as PCP - General (Internal Medicine) Brunetta Genera, Alex [name] as Consulting Physician (Hematology) Nephrology - Dr Madelon Lips Alex [name] Dr Henrene Pastor Alex [name] - GI  CHIEF COMPLAINTS/PURPOSE OF CONSULTATION:  AL Amyloidosis -with Nephrotic syndrome . No overt CHF pEF  HISTORY OF PRESENTING ILLNESS:   Alex Irving. is a wonderful 77 y.o. male who has been referred to [place] by Dr .[name], Alex [name] /Dr Madelon Lips Alex [name] for evaluation and management of newly diagnosed Kidney AL Amyloidosis with nephrotic syndrome.  Patient has a history of hypertension, dyslipidemia, coronary artery disease, seizure disorder on Dilantin who was apparently in his usual state of health until 3 months ago when he started developing new onset lower extremity edema. Patient had a UA at the time that showed 4+ protein in 24-hour collection revealed 15 g of protein. Additional workup showed a creatinine of 0.8 with an albumin of 2.3 and negative SPEP and UPEP. Apparently had an elevated K/L SFLC ratio of 5.19.  He was urgently referred by his primary care physician to nephrology for additional evaluation. Patient notes no bleeding issues. No nosebleeds. No periorbital bleeding. No abnormal skin rashes. Has had significant NSAID exposure and use to take a fair amount of naproxen for chronic low back pain but has cut down on this.  Due to lack of clear etiology for his nephrotic syndrome the patient underwent a kidney biopsy on 08/06/2015 accession [id number] which showed AL amyloidosis, lambda immunophenotype, [place] red positive. Noted to have severe arteriosclerosis with moderate tubular interstitial scarring.  Patient was referred to [place] for further evaluation and treatment of his AL Amyloidosis, Patient notes that his leg swelling has improved with diuretic therapy.  He notes no weight loss. No night sweats. No focal  bone pains. No skin rashes. Lungs normal bleeding or bruising.  Patient notes that he had a lot of reading online and he and his wife had an extensive list of questions which were answered in detail.  He notes he has had some chronic issues with upper abdominal pain and nausea and that he follows with Dr. Henrene Pastor and is apparently being scheduled for an EGD and colonoscopy according to his report.  Denies having and lacks tongue. No chest pain and no overt new shortness of breath. Patient is uncertain if an echocardiogram has been ordered or scheduled.  INTERVAL HISTORY  Alex [name] is here for a scheduled follow-up for his AL amyloidosis. Of note since his last visit, he was admitted on 04/11/17 for stem cell transplant. He is accompanied by his wife today. He is doing well overall. He is no longer neutropenic. He states his appetite has improved and his fevers have subsided.  Blood counts suggest successful engraftment. Mild diarrhea - likely from mucositis related to chemotherapy. C diff testing recently neg at [facility] On review of systems, pt reports diarrhea, minimal swelling to his feet and denies bloody stools, abdominal pain, rashes, cough, CP, SOB and any other accompanying symptoms.   MEDICAL HISTORY:  Past Medical History:  Diagnosis Date  . Amyloidosis ([place])   . Arthritis   . CAD (coronary artery disease)    Lexiscan Myoview 6/14: Normal study, no scar or ischemia, EF 62%  . Colon polyp   . Coronary artery disease    moderate disease by cath 2011  . Diverticulosis of colon   . GERD (gastroesophageal reflux disease)   .  Hearing loss   . Hiatal hernia   . Hyperlipidemia   . Hypertension   . MRSA (methicillin resistant staph aureus) culture positive   . Seizure disorder ([place])   . Seizures ([place])    last in 1970s  Obstructive sleep apnea Gastroesophageal reflux disease Aortic stenosis Erectile dysfunction Peripheral neuropathy Seizure disorder   SURGICAL HISTORY: Past  Surgical History:  Procedure Laterality Date  . BUNIONECTOMY    . CARPAL TUNNEL RELEASE  1994   left  . CERVICAL FUSION    . compression fracture    . FOOT SURGERY     right -twice, left foot once  . HERNIA REPAIR     Umbilical with PVP  . KNEE ARTHROSCOPY     left knee  . [place]   left - scope  . NASAL SEPTUM SURGERY    . NOSE SURGERY     twice  . ROTATOR CUFF REPAIR     twice, both shoulders  . ROTATOR CUFF REPAIR    . SPINE SURGERY     C2, C3, C4  . TRIGGER FINGER RELEASE     both hands, twice  . TRIGGER FINGER RELEASE    . VASECTOMY      SOCIAL HISTORY: Social History   Socioeconomic History  . Marital status: Married    Spouse name: Not on file  . Number of children: 2  . Years of education: 52  . Highest education level: Not on file  Social Needs  . Financial resource strain: Not on file  . Food insecurity - worry: Not on file  . Food insecurity - inability: Not on file  . Transportation needs - medical: Not on file  . Transportation needs - non-medical: Not on file  Occupational History  . Occupation: Retired  Tobacco Use  . Smoking status: Former Smoker    Last attempt to quit: 04/25/1969    Years since quitting: 48.0  . Smokeless tobacco: Former Network engineer and Sexual Activity  . Alcohol use: Yes    Alcohol/week: 4.2 - 8.4 oz    Types: 7 - 14 Glasses of wine per week    Comment: socially shots of wiskey  . Drug use: No  . Sexual activity: Not on file  Other Topics Concern  . Not on file  Social History Narrative   ** Merged History Encounter **   Lives at home w/ his wife   Right-handed   Caffeine: occasional Pepsi      Patient is currently retired.   FAMILY HISTORY: Family History  Problem Relation Age of Onset  . Diabetes Father   . Heart disease Father   . Heart attack Father 24  . Hypertension Father   . Hyperlipidemia Unknown   . Diabetes Unknown   . Hypertension Unknown   . Seizures Unknown   . Thyroid  disease Unknown   . Colon cancer Neg Hx   . Stroke Neg Hx     ALLERGIES:  is allergic to ramipril.  MEDICATIONS:  Current Alex [name] Medications  Medication Sig Dispense Refill  . acyclovir (ZOVIRAX) 400 MG tablet Take 1 tablet (400 mg total) by mouth 2 (two) times daily. 60 tablet 3  . atorvastatin (LIPITOR) 40 MG tablet Take 40 mg by mouth at bedtime.     . cholecalciferol (VITAMIN D) 1000 units tablet Take 1,000 Units by mouth daily.    . magic mouthwash w/lidocaine SOLN Take 5 mLs by mouth 4 (four) times daily as needed  for mouth pain. 1 part viscous lidocaine 2%; 1 part maalox; 1 part diphenhydramine 12.27m per 565melixir 200 mL 0  . mupirocin cream (BACTROBAN) 2 % Apply 1 application topically 2 (two) times daily. (Patient taking differently: Apply 1 application topically 2 (two) times daily as needed (for rash/irritation). ) 30 g 0  . nitroGLYCERIN (NITROSTAT) 0.4 MG SL tablet Place 1 tablet (0.4 mg total) under the tongue every 5 (five) minutes as needed for chest pain. 25 tablet 6  . omeprazole (PRILOSEC) 20 MG capsule Take 20 mg by mouth daily.      . ondansetron (ZOFRAN) 8 MG tablet Take 1 tablet (8 mg total) by mouth 2 (two) times daily as needed for refractory nausea / vomiting. Start on D 4 and 11. Do not take on D8. 30 tablet 1  . potassium chloride SA (K-DUR,KLOR-CON) 20 MEQ tablet Take 20 mEq by mouth at bedtime.     . prochlorperazine (COMPAZINE) 10 MG tablet Take 1 tablet (10 mg total) by mouth every 6 (six) hours as needed (Nausea or vomiting). 30 tablet 1  . ixazomib citrate (NINLARO) 4 MG capsule Take 1 capsule (4 mg total) by mouth once a week. On D1, D8 and D15 every 28 days.Take on an empty stomach 1hr before or 2hrs after food. (Patient not taking: Reported on 05/02/2017) 3 capsule 3   No current facility-administered medications for this visit.     REVIEW OF SYSTEMS:    10 Point review of Systems was done is negative except as noted above.  PHYSICAL  EXAMINATION: ECOG PERFORMANCE STATUS: 1 - Symptomatic but completely ambulatory  . Vitals:   05/02/17 1410  BP: 122/84  Pulse: 96  Resp: 18  SpO2: 97%   Filed Weights   05/02/17 1410  Weight: 184 lb 9.6 oz (83.7 kg)   .Body mass index is 25.75 kg/m.  GENERAL:alert, in no acute distress and comfortable SKIN: no acute rashes, no significant lesions EYES: conjunctiva are pink and non-injected, sclera anicteric OROPHARYNX: MMM, no exudates, no oropharyngeal erythema or ulceration, no macroglossia NECK: supple, mild JVD LYMPH:  no palpable lymphadenopathy in the cervical, axillary or inguinal regions LUNGS: clear to auscultation b/l with normal respiratory effort HEART: regular rate & rhythm ABDOMEN:  normoactive bowel sounds , non tender, not distended. Extremity: No pedal edema notedPSYCH: alert & oriented x 3 with fluent speech NEURO: no focal motor/sensory deficits  LABORATORY DATA:  I have reviewed the data as listed  . CBC Latest Ref Rng & Units 05/01/2017 01/30/2017 01/09/2017  WBC 4.0 - 10.3 K/uL 11.4(H) 8.7 5.8  Hemoglobin 13.0 - 17.1 g/dL 11.2(L) 11.8(L) 10.8(L)  Hematocrit 38.4 - 49.9 % 33.1(L) 34.5(L) 31.9(L)  Platelets 140 - 400 K/uL 175 126(L) 126(L)    CMP Latest Ref Rng & Units 05/01/2017 01/30/2017 01/30/2017  Glucose 70 - 140 mg/dL 99 111 -  BUN 7 - 26 mg/dL 19 20.0 -  Creatinine 0.70 - 1.30 mg/dL 1.15 1.2 -  Sodium 136 - 145 mmol/L 140 142 -  Potassium 3.5 - 5.1 mmol/L 3.2(L) 3.7 -  Chloride 98 - 109 mmol/L 103 - -  CO2 22 - 29 mmol/L 27 26 -  Calcium 8.4 - 10.4 mg/dL 8.8 9.1 -  Total Protein 6.4 - 8.3 g/dL 6.0(L) 5.6(L) 5.1(L)  Total Bilirubin 0.2 - 1.2 mg/dL 0.4 0.68 -  Alkaline Phos 40 - 150 U/L 167(H) 61 -  AST 5 - 34 U/L 27 25 -  ALT 0 - 55 U/L  31 17 -        Are you good       RADIOGRAPHIC STUDIES: I have personally reviewed the radiological images as listed and agreed with the findings in the report. No results found. Result status:  Edited Result - FINAL                              *[facility] [employer].                        [place], [place] [address]                            [phone]  ------------------------------------------------------------------- Transthoracic Echocardiography  (Report amended )  Patient:    Alex [name], Alex [name] Alex #:       [id number] Study Date: 08/24/2016 Gender:     M Age:        89 Height:     180.3 cm Weight:     90.6 kg BSA:        2.15 m^2 Pt. Status: Room:   ATTENDING    Alex Guys, Alex [name]  Alex [name], Alex [name]  REFERRING    Alex [name], Alex [name], Alex [name]  SONOGRAPHER  Roseanna Rainbow  cc:  ------------------------------------------------------------------- LV EF: 65% -   70%  ------------------------------------------------------------------- Indications:      Aortic stenosis 424.1.  ------------------------------------------------------------------- History:   PMH:  Amyloidosis.  Coronary artery disease.  Angina pectoris.  Risk factors:  Hypertension. Dyslipidemia.  ------------------------------------------------------------------- Study Conclusions  - Left ventricle: The cavity size was normal. Wall thickness was   increased in a pattern of mild LVH. Systolic function was   vigorous. The estimated ejection fraction was in the range of 65%   to 70%. Wall motion was normal; there were no regional wall   motion abnormalities. Doppler parameters are consistent with   abnormal left ventricular relaxation (grade 1 diastolic   dysfunction). - Aortic valve: Valve mobility was mildly restricted. - Aortic root: The aortic root was mildly dilated. - Mitral valve: Calcified annulus.  Impressions:  - Vigorous LV systolic function; mild diastolic dysfunction; mild   LVH; calcified aortic valve with no significant AS (peak velocity   2 m/s;  mean gradient 9 mmHg); mildly dilated aortic root.    ASSESSMENT & PLAN:   77 year old male with above-mentioned multiple medical comorbidities with  #1 Biopsy proven Renal and Systemic AL Amyloidosis Initial BM Bx shows 9% plasma cells and amyloid deposits. Echo shows normal systolic function with only grade 1 diastolic dysfunction. Skeletal survey with no lytic lesions suggestive of multiple myeloma. Serum K/L ratio increased at about 6.22 on diagnosis and has now improved to 1.5 (wnl with normal kappa lambda serum free light chain ratio ) UPEP shows 24h protein improved from 15g to 5g daily and the last recent one is about 7.3 g /24h  #2  Nephrotic Syndrome - likely related to AL amyloidosis.  UPEP shows 24h protein improved from 15g to 5g daily and the last recent one is about 7.3 g /24h  Plan -Blood counts stable. SFLC suggest patient continues to be in hematologic remission. Of note since his last visit, he was admitted on 04/11/17 for stem cell transplant. He is accompanied by his wife today. He is doing well overall. He is no longer neutropenic. He states his appetite has improved and his fevers have subsided.  -Blood counts suggest successful engraftment. -had continued f/u with Dr. Norma Fredrickson at [facility], -he has a f/u in 2 weeks with labs at [facility] -We shall see him back in 4 weeks. -will rpt urine protein/creatinine ratio on next visit.   #3 Mild diarrhea - likely from mucositis No blood/mucous/tenesmus. Improving. Recent c diff neg -monitor. -prn imodium as he is current doing  # 4 s/p HCAP resolved.  #5 Patient Active Problem List   Diagnosis Date Noted  . Folliculitis 70/14/1030  . AL amyloidosis ([place]) 08/21/2016  . Umbilical hernia 13/14/3888  . DIZZINESS 02/19/2010  . CAD, NATIVE VESSEL 07/21/2009  . CHEST PAIN-UNSPECIFIED 07/21/2009  . HYPERLIPIDEMIA 04/30/2007  . HYPERTENSION 04/30/2007  . GASTROESOPHAGEAL REFLUX DISEASE  04/30/2007  . SEIZURE DISORDER 04/30/2007  . COMPRESSION FRACTURE, L1 VERTEBRA 04/30/2007  . DIVERTICULOSIS, COLON, HX OF 04/30/2007  . Other postprocedural status(V45.89) 04/30/2007  -Continue follow-up with primary care physician for management of other medical co-morbidities.  -Recommend if pt experiences spike fever to report to hospital with transplant team   RTC with Dr [name] in 1 month with labs  All of the patients and his wife's questions were answered to their apparent satisfaction. They are agreeable with the plan as noted above .The patient knows to call the clinic with any problems, questions or concerns.  I spent 20 minutes counseling the patient face to face. The total time spent in the appointment was 25 minutes and more than 50% was on counseling and direct patient cares.    Sullivan Lone Alex [name] [place] AAHIVMS [facility] Hematology/Oncology Physician [facility]  (Office):       [phone] (Work cell):  [phone] (Fax):           [phone]  This document serves as a record of services personally performed by Sullivan Lone, Alex [name]. It was created on his behalf by Alean Rinne, a trained medical scribe. The creation of this record is based on the scribe's personal observations and the provider's statements to them.   .I have reviewed the above documentation for accuracy and completeness, and I agree with the above. Brunetta Genera MD MS

## 2017-05-04 ENCOUNTER — Telehealth: Payer: Self-pay | Admitting: Hematology

## 2017-05-04 LAB — MULTIPLE MYELOMA PANEL, SERUM
ALBUMIN/GLOB SERPL: 0.9 (ref 0.7–1.7)
ALPHA 1: 0.4 g/dL (ref 0.0–0.4)
ALPHA2 GLOB SERPL ELPH-MCNC: 1 g/dL (ref 0.4–1.0)
Albumin SerPl Elph-Mcnc: 2.5 g/dL — ABNORMAL LOW (ref 2.9–4.4)
B-GLOBULIN SERPL ELPH-MCNC: 0.9 g/dL (ref 0.7–1.3)
Gamma Glob SerPl Elph-Mcnc: 0.5 g/dL (ref 0.4–1.8)
Globulin, Total: 2.8 g/dL (ref 2.2–3.9)
IGG (IMMUNOGLOBIN G), SERUM: 527 mg/dL — AB (ref 700–1600)
IgA: 120 mg/dL (ref 61–437)
IgM (Immunoglobulin M), Srm: 58 mg/dL (ref 15–143)
Total Protein ELP: 5.3 g/dL — ABNORMAL LOW (ref 6.0–8.5)

## 2017-05-04 LAB — KAPPA/LAMBDA LIGHT CHAINS
Kappa free light chain: 38.7 mg/L — ABNORMAL HIGH (ref 3.3–19.4)
Kappa, lambda light chain ratio: 0.65 (ref 0.26–1.65)
LAMDA FREE LIGHT CHAINS: 59.6 mg/L — AB (ref 5.7–26.3)

## 2017-05-04 NOTE — Telephone Encounter (Signed)
Spoke with patient re February appointments.

## 2017-05-10 DIAGNOSIS — R197 Diarrhea, unspecified: Secondary | ICD-10-CM | POA: Diagnosis not present

## 2017-05-10 DIAGNOSIS — Z9484 Stem cells transplant status: Secondary | ICD-10-CM | POA: Diagnosis not present

## 2017-05-10 DIAGNOSIS — Z86018 Personal history of other benign neoplasm: Secondary | ICD-10-CM | POA: Diagnosis not present

## 2017-05-10 DIAGNOSIS — E8581 Light chain (AL) amyloidosis: Secondary | ICD-10-CM | POA: Diagnosis not present

## 2017-06-01 NOTE — Progress Notes (Signed)
HEMATOLOGY/ONCOLOGY CLINIC NOTE  Date of Service: 06/02/17   Patient Care Team: [name], MD as PCP - General (Internal Medicine) Brunetta Genera, MD as Consulting Physician (Hematology) Nephrology - Dr Madelon Lips MD Dr Henrene Pastor MD - GI  CHIEF COMPLAINTS/PURPOSE OF CONSULTATION:  AL Amyloidosis -with Nephrotic syndrome . No overt CHF pEF  HISTORY OF PRESENTING ILLNESS:   Alex [name]. is a wonderful 77 y.o. male who has been referred to [place] by Dr .[name], MD /Dr Madelon Lips MD for evaluation and management of newly diagnosed Kidney AL Amyloidosis with nephrotic syndrome.  Patient has a history of hypertension, dyslipidemia, coronary artery disease, seizure disorder on Dilantin who was apparently in his usual state of health until 3 months ago when he started developing new onset lower extremity edema. Patient had a UA at the time that showed 4+ protein in 24-hour collection revealed 15 g of protein. Additional workup showed a creatinine of 0.8 with an albumin of 2.3 and negative SPEP and UPEP. Apparently had an elevated K/L SFLC ratio of 5.19.  He was urgently referred by his primary care physician to nephrology for additional evaluation. Patient notes no bleeding issues. No nosebleeds. No periorbital bleeding. No abnormal skin rashes. Has had significant NSAID exposure and use to take a fair amount of naproxen for chronic low back pain but has cut down on this.  Due to lack of clear etiology for his nephrotic syndrome the patient underwent a kidney biopsy on 08/06/2015 accession [id number] which showed AL amyloidosis, lambda immunophenotype, [place] red positive. Noted to have severe arteriosclerosis with moderate tubular interstitial scarring.  Patient was referred to [place] for further evaluation and treatment of his AL Amyloidosis, Patient notes that his leg swelling has improved with diuretic therapy.  He notes no weight loss. No night sweats. No focal  bone pains. No skin rashes. Lungs normal bleeding or bruising.  Patient notes that he had a lot of reading online and he and his wife had an extensive list of questions which were answered in detail.  He notes he has had some chronic issues with upper abdominal pain and nausea and that he follows with Dr. Henrene Pastor and is apparently being scheduled for an EGD and colonoscopy according to his report.  Denies having and lacks tongue. No chest pain and no overt new shortness of breath. Patient is uncertain if an echocardiogram has been ordered or scheduled.  INTERVAL HISTORY  Alex [name] is here for a scheduled follow-up for his AL amyloidosis. The patient's last visit with [place] was on 05/02/17 and he is roughly 50 days out from stem cell transplant on 04/11/17. He is accompanied today by his wife. The pt reports that he is doing well overall and hopes to get more and more active. The pt reports some SOB caused by walking around and breathing through his mask. He notes that his mouth feels "slick". He denies metallic taste. His wife reports that he's being a little more active around the house. He will see Dr. Norma Fredrickson at [facility] in March next.   Lab results today (06/02/17 ) of CBC, CMP is as follows: all values are WNL except for RBC at 4.02, Hgb at 12.5, HCT at 36.8, RDW at 16.0, Platelets at 114k, Creatinine at 1.38, Albumin at 3.2. Urine Protein, MMP and Kappa/lambda are all pending today 06/02/17.  On review of systems, pt reports eating better, gaining a little desired weight, loose stools, and denies fevers, chills, signs  of infections, leg swelling, problems passing urine, eye issues, and abdominal pains.   MEDICAL HISTORY:  Past Medical History:  Diagnosis Date  . Amyloidosis ([place])   . Arthritis   . CAD (coronary artery disease)    Lexiscan Myoview 6/14: Normal study, no scar or ischemia, EF 62%  . Colon polyp   . Coronary artery disease    moderate disease by cath 2011  . Diverticulosis of  colon   . GERD (gastroesophageal reflux disease)   . Hearing loss   . Hiatal hernia   . Hyperlipidemia   . Hypertension   . MRSA (methicillin resistant staph aureus) culture positive   . Seizure disorder ([place])   . Seizures ([place])    last in 1970s  Obstructive sleep apnea Gastroesophageal reflux disease Aortic stenosis Erectile dysfunction Peripheral neuropathy Seizure disorder   SURGICAL HISTORY: Past Surgical History:  Procedure Laterality Date  . BUNIONECTOMY    . CARPAL TUNNEL RELEASE  1994   left  . CERVICAL FUSION    . compression fracture    . FOOT SURGERY     right -twice, left foot once  . HERNIA REPAIR     Umbilical with PVP  . KNEE ARTHROSCOPY     left knee  . [place]   left - scope  . NASAL SEPTUM SURGERY    . NOSE SURGERY     twice  . ROTATOR CUFF REPAIR     twice, both shoulders  . ROTATOR CUFF REPAIR    . SPINE SURGERY     C2, C3, C4  . TRIGGER FINGER RELEASE     both hands, twice  . TRIGGER FINGER RELEASE    . VASECTOMY      SOCIAL HISTORY: Social History   Socioeconomic History  . Marital status: Married    Spouse name: Not on file  . Number of children: 2  . Years of education: 36  . Highest education level: Not on file  Social Needs  . Financial resource strain: Not on file  . Food insecurity - worry: Not on file  . Food insecurity - inability: Not on file  . Transportation needs - medical: Not on file  . Transportation needs - non-medical: Not on file  Occupational History  . Occupation: Retired  Tobacco Use  . Smoking status: Former Smoker    Last attempt to quit: 04/25/1969    Years since quitting: 48.1  . Smokeless tobacco: Former Network engineer and Sexual Activity  . Alcohol use: Yes    Alcohol/week: 4.2 - 8.4 oz    Types: 7 - 14 Glasses of wine per week    Comment: socially shots of wiskey  . Drug use: No  . Sexual activity: Not on file  Other Topics Concern  . Not on file  Social History Narrative   **  Merged History Encounter **   Lives at home w/ his wife   Right-handed   Caffeine: occasional Pepsi      Patient is currently retired.   FAMILY HISTORY: Family History  Problem Relation Age of Onset  . Diabetes Father   . Heart disease Father   . Heart attack Father 59  . Hypertension Father   . Hyperlipidemia Unknown   . Diabetes Unknown   . Hypertension Unknown   . Seizures Unknown   . Thyroid disease Unknown   . Colon cancer Neg Hx   . Stroke Neg Hx     ALLERGIES:  is allergic  to ramipril.  MEDICATIONS:  Current Outpatient Medications  Medication Sig Dispense Refill  . acyclovir (ZOVIRAX) 400 MG tablet Take 1 tablet (400 mg total) by mouth 2 (two) times daily. 60 tablet 3  . atorvastatin (LIPITOR) 40 MG tablet Take 40 mg by mouth at bedtime.     . cholecalciferol (VITAMIN D) 1000 units tablet Take 1,000 Units by mouth daily.    . ixazomib citrate (NINLARO) 4 MG capsule Take 1 capsule (4 mg total) by mouth once a week. On D1, D8 and D15 every 28 days.Take on an empty stomach 1hr before or 2hrs after food. (Patient not taking: Reported on 05/02/2017) 3 capsule 3  . magic mouthwash w/lidocaine SOLN Take 5 mLs by mouth 4 (four) times daily as needed for mouth pain. 1 part viscous lidocaine 2%; 1 part maalox; 1 part diphenhydramine 12.'5mg'$  per 54m elixir 200 mL 0  . mupirocin cream (BACTROBAN) 2 % Apply 1 application topically 2 (two) times daily. (Patient taking differently: Apply 1 application topically 2 (two) times daily as needed (for rash/irritation). ) 30 g 0  . nitroGLYCERIN (NITROSTAT) 0.4 MG SL tablet Place 1 tablet (0.4 mg total) under the tongue every 5 (five) minutes as needed for chest pain. 25 tablet 6  . omeprazole (PRILOSEC) 20 MG capsule Take 20 mg by mouth daily.      . ondansetron (ZOFRAN) 8 MG tablet Take 1 tablet (8 mg total) by mouth 2 (two) times daily as needed for refractory nausea / vomiting. Start on D 4 and 11. Do not take on D8. 30 tablet 1  . potassium  chloride SA (K-DUR,KLOR-CON) 20 MEQ tablet Take 20 mEq by mouth at bedtime.     . prochlorperazine (COMPAZINE) 10 MG tablet Take 1 tablet (10 mg total) by mouth every 6 (six) hours as needed (Nausea or vomiting). 30 tablet 1   No current facility-administered medications for this visit.     REVIEW OF SYSTEMS:    10 Point review of Systems was done is negative except as noted above.  PHYSICAL EXAMINATION: ECOG PERFORMANCE STATUS: 1 - Symptomatic but completely ambulatory  . Vitals:   06/02/17 1153  BP: (!) 139/91  Pulse: 80  Resp: 18  Temp: 97.8 F (36.6 C)  SpO2: 99%   Filed Weights   06/02/17 1153  Weight: 183 lb (83 kg)   .Body mass index is 25.52 kg/m.  GENERAL:alert, in no acute distress and comfortable SKIN: no acute rashes, no significant lesions EYES: conjunctiva are pink and non-injected, sclera anicteric OROPHARYNX: MMM, no exudates, no oropharyngeal erythema or ulceration, no macroglossia NECK: supple, mild JVD LYMPH:  no palpable lymphadenopathy in the cervical, axillary or inguinal regions LUNGS: clear to auscultation b/l with normal respiratory effort HEART: regular rate & rhythm ABDOMEN:  normoactive bowel sounds , non tender, not distended. Extremity: No pedal edema notedPSYCH: alert & oriented x 3 with fluent speech NEURO: no focal motor/sensory deficits  LABORATORY DATA:  I have reviewed the data as listed  . CBC Latest Ref Rng & Units 06/02/2017 05/01/2017 01/30/2017  WBC 4.0 - 10.3 K/uL 7.3 11.4(H) 8.7  Hemoglobin 13.0 - 17.1 g/dL 12.5(L) 11.2(L) 11.8(L)  Hematocrit 38.4 - 49.9 % 36.8(L) 33.1(L) 34.5(L)  Platelets 140 - 400 K/uL 114(L) 175 126(L)    CMP Latest Ref Rng & Units 06/02/2017 05/01/2017 01/30/2017  Glucose 70 - 140 mg/dL 107 99 111  BUN 7 - 26 mg/dL 18 19 20.0  Creatinine 0.70 - 1.30 mg/dL 1.38(H) 1.15 1.2  Sodium 136 - 145 mmol/L 140 140 142  Potassium 3.5 - 5.1 mmol/L 4.1 3.2(L) 3.7  Chloride 98 - 109 mmol/L 107 103 -  CO2 22 - 29  mmol/L _0 Calcium 8.4 - 10.4 mg/dL 9.2 8.8 9.1  Total Protein 6.4 - 8.3 g/dL 6.8 6.0(L) 5.6(L)  Total Bilirubin 0.2 - 1.2 mg/dL 0.6 0.4 0.68  Alkaline Phos 40 - 150 U/L 72 167(H) 61  AST 5 - 34 U/L _1 ALT 0 - 55 U/L _2 RADIOGRAPHIC STUDIES: I have personally reviewed the radiological images as listed and agreed with the findings in the report. No results found. Result status: Edited Result - FINAL                              *[facility] [employer].                        [place], [place] [address]                            [phone]  ------------------------------------------------------------------- Transthoracic Echocardiography  (Report amended )  Patient:    Alex [name], Alex [name] Alex #:       [id number] Study Date: 08/24/2016 Gender:     M Age:        8 Height:     180.3 cm Weight:     90.6 kg BSA:        2.15 m^2 Pt. Status: Room:   ATTENDING    Darlina Guys, MD  Willia Craze, Christopher  REFERRING    [place], [place], Outpatient  SONOGRAPHER  Roseanna Rainbow  cc:  ------------------------------------------------------------------- LV EF: 65% -   70%  ------------------------------------------------------------------- Indications:      Aortic stenosis 424.1.  ------------------------------------------------------------------- History:   PMH:  Amyloidosis.  Coronary artery disease.  Angina pectoris.  Risk factors:  Hypertension. Dyslipidemia.  ------------------------------------------------------------------- Study Conclusions  - Left ventricle: The cavity size was normal. Wall thickness was   increased in a pattern of mild LVH. Systolic function was   vigorous. The estimated ejection fraction was in the range of 65%   to 70%. Wall motion was normal; there were no regional wall   motion abnormalities.  Doppler parameters are consistent with   abnormal left ventricular relaxation (grade 1 diastolic   dysfunction). - Aortic valve: Valve mobility was mildly restricted. - Aortic root: The aortic root was mildly dilated. - Mitral valve: Calcified annulus.  Impressions:  - Vigorous LV systolic function; mild diastolic dysfunction; mild   LVH; calcified aortic valve with no significant AS (peak velocity   2 m/s; mean gradient 9 mmHg); mildly dilated aortic root.    ASSESSMENT & PLAN:   77 y.o.  male with above-mentioned multiple medical comorbidities with  #1 Biopsy proven Renal and Systemic AL Amyloidosis Initial BM Bx shows 9% plasma cells and amyloid deposits. Echo shows normal systolic function with only grade 1 diastolic dysfunction. Skeletal survey with no lytic lesions suggestive  of multiple myeloma. Serum K/L ratio increased at about 6.22 on diagnosis and has now improved to 1.5 (wnl with normal kappa lambda serum free light chain ratio ) UPEP shows 24h protein improved from 15g to 5g daily and the last recent one is about 7.3 g /24h  #2  Nephrotic Syndrome - likely related to AL amyloidosis.  UPEP shows 24h protein improved from 15g to 5g daily and the last recent one is about 7.3 g /24h  Plan -labs stable -urinary protein appears to be improved but still present -Discussed pt labwork today, RBC and WBC are stable. Albumin has improved. Pt is not neutropenic.  -will f/u on pending SPEP and SFLC -has continued f/u with Dr. Norma Fredrickson at [facility] in march 2019. -Discussed that he continue slowly increasing his activity levels.  -Advised salt and baking soda mouthwash.  -RTC in 3 months.   #3 Mild diarrhea - likely from mucositis No blood/mucous/tenesmus. resolved Recent c diff neg -monitor. -prn imodium as he is current doing  # 4 s/p HCAP resolved.  #5 Patient Active Problem List   Diagnosis Date Noted  . Folliculitis 59/47/0761  . AL  amyloidosis ([place]) 08/21/2016  . Umbilical hernia 51/83/4373  . DIZZINESS 02/19/2010  . CAD, NATIVE VESSEL 07/21/2009  . CHEST PAIN-UNSPECIFIED 07/21/2009  . HYPERLIPIDEMIA 04/30/2007  . HYPERTENSION 04/30/2007  . GASTROESOPHAGEAL REFLUX DISEASE 04/30/2007  . SEIZURE DISORDER 04/30/2007  . COMPRESSION FRACTURE, L1 VERTEBRA 04/30/2007  . DIVERTICULOSIS, COLON, HX OF 04/30/2007  . Other postprocedural status(V45.89) 04/30/2007  -Continue follow-up with primary care physician for management of other medical co-morbidities.  -Recommend if pt experiences spike fever to report to hospital with transplant team   RTC with Dr [name] in 3 months with labs  All of the patients and his wife's questions were answered to their apparent satisfaction. They are agreeable with the plan as noted above .The patient knows to call the clinic with any problems, questions or concerns.  I spent 20 minutes counseling the patient face to face. The total time spent in the appointment was 20 minutes and more than 50% was on counseling and direct patient cares.   Sullivan Lone MD [place] AAHIVMS [facility] Hematology/Oncology Physician [facility]  (Office):       [phone] (Work cell):  [phone] (Fax):           [phone]  This document serves as a record of services personally performed by Sullivan Lone, MD. It was created on his behalf by Baldwin Jamaica, a trained medical scribe. The creation of this record is based on the scribe's personal observations and the provider's statements to them.   .I have reviewed the above documentation for accuracy and completeness, and I agree with the above. Brunetta Genera MD

## 2017-06-02 ENCOUNTER — Encounter: Payer: Self-pay | Admitting: Hematology

## 2017-06-02 ENCOUNTER — Inpatient Hospital Stay: Payer: Medicare HMO

## 2017-06-02 ENCOUNTER — Inpatient Hospital Stay: Payer: Medicare HMO | Attending: Hematology | Admitting: Hematology

## 2017-06-02 VITALS — BP 139/91 | HR 80 | Temp 97.8°F | Resp 18 | Ht 71.0 in | Wt 183.0 lb

## 2017-06-02 DIAGNOSIS — G629 Polyneuropathy, unspecified: Secondary | ICD-10-CM | POA: Insufficient documentation

## 2017-06-02 DIAGNOSIS — K219 Gastro-esophageal reflux disease without esophagitis: Secondary | ICD-10-CM | POA: Diagnosis not present

## 2017-06-02 DIAGNOSIS — E8581 Light chain (AL) amyloidosis: Secondary | ICD-10-CM

## 2017-06-02 DIAGNOSIS — I350 Nonrheumatic aortic (valve) stenosis: Secondary | ICD-10-CM | POA: Insufficient documentation

## 2017-06-02 DIAGNOSIS — K449 Diaphragmatic hernia without obstruction or gangrene: Secondary | ICD-10-CM | POA: Insufficient documentation

## 2017-06-02 DIAGNOSIS — R197 Diarrhea, unspecified: Secondary | ICD-10-CM | POA: Diagnosis not present

## 2017-06-02 DIAGNOSIS — Z8614 Personal history of Methicillin resistant Staphylococcus aureus infection: Secondary | ICD-10-CM | POA: Insufficient documentation

## 2017-06-02 DIAGNOSIS — N049 Nephrotic syndrome with unspecified morphologic changes: Secondary | ICD-10-CM | POA: Insufficient documentation

## 2017-06-02 DIAGNOSIS — E785 Hyperlipidemia, unspecified: Secondary | ICD-10-CM | POA: Diagnosis not present

## 2017-06-02 DIAGNOSIS — Z87891 Personal history of nicotine dependence: Secondary | ICD-10-CM | POA: Insufficient documentation

## 2017-06-02 DIAGNOSIS — I10 Essential (primary) hypertension: Secondary | ICD-10-CM | POA: Diagnosis not present

## 2017-06-02 DIAGNOSIS — D696 Thrombocytopenia, unspecified: Secondary | ICD-10-CM

## 2017-06-02 DIAGNOSIS — G4733 Obstructive sleep apnea (adult) (pediatric): Secondary | ICD-10-CM | POA: Diagnosis not present

## 2017-06-02 DIAGNOSIS — Z79899 Other long term (current) drug therapy: Secondary | ICD-10-CM | POA: Diagnosis not present

## 2017-06-02 DIAGNOSIS — K5730 Diverticulosis of large intestine without perforation or abscess without bleeding: Secondary | ICD-10-CM | POA: Insufficient documentation

## 2017-06-02 DIAGNOSIS — I2510 Atherosclerotic heart disease of native coronary artery without angina pectoris: Secondary | ICD-10-CM | POA: Insufficient documentation

## 2017-06-02 LAB — CMP (CANCER CENTER ONLY)
ALT: 13 U/L (ref 0–55)
ANION GAP: 8 (ref 3–11)
AST: 23 U/L (ref 5–34)
Albumin: 3.2 g/dL — ABNORMAL LOW (ref 3.5–5.0)
Alkaline Phosphatase: 72 U/L (ref 40–150)
BUN: 18 mg/dL (ref 7–26)
CHLORIDE: 107 mmol/L (ref 98–109)
CO2: 25 mmol/L (ref 22–29)
CREATININE: 1.38 mg/dL — AB (ref 0.70–1.30)
Calcium: 9.2 mg/dL (ref 8.4–10.4)
GFR, EST NON AFRICAN AMERICAN: 49 mL/min — AB (ref >60)
GFR, Est AFR Am: 57 mL/min — ABNORMAL LOW (ref >60)
Glucose, Bld: 107 mg/dL (ref 70–140)
POTASSIUM: 4.1 mmol/L (ref 3.5–5.1)
SODIUM: 140 mmol/L (ref 136–145)
Total Bilirubin: 0.6 mg/dL (ref 0.2–1.2)
Total Protein: 6.8 g/dL (ref 6.4–8.3)

## 2017-06-02 LAB — CBC WITH DIFFERENTIAL/PLATELET
Basophils Absolute: 0 10*3/uL (ref 0.0–0.1)
Basophils Relative: 0 %
EOS ABS: 0.3 10*3/uL (ref 0.0–0.5)
Eosinophils Relative: 4 %
HCT: 36.8 % — ABNORMAL LOW (ref 38.4–49.9)
Hemoglobin: 12.5 g/dL — ABNORMAL LOW (ref 13.0–17.1)
LYMPHS ABS: 1.8 10*3/uL (ref 0.9–3.3)
LYMPHS PCT: 24 %
MCH: 31.1 pg (ref 27.2–33.4)
MCHC: 34 g/dL (ref 32.0–36.0)
MCV: 91.5 fL (ref 79.3–98.0)
MONO ABS: 0.5 10*3/uL (ref 0.1–0.9)
Monocytes Relative: 7 %
Neutro Abs: 4.8 10*3/uL (ref 1.5–6.5)
Neutrophils Relative %: 65 %
Platelets: 114 10*3/uL — ABNORMAL LOW (ref 140–400)
RBC: 4.02 MIL/uL — AB (ref 4.20–5.82)
RDW: 16 % — AB (ref 11.0–14.6)
WBC: 7.3 10*3/uL (ref 4.0–10.3)

## 2017-06-02 LAB — PROTEIN / CREATININE RATIO, URINE
CREATININE, URINE: 104.8 mg/dL
PROTEIN CREATININE RATIO: 2.59 mg/mg{creat} — AB (ref 0.00–0.15)
Total Protein, Urine: 271 mg/dL

## 2017-06-02 NOTE — Patient Instructions (Signed)
Thank you for choosing [facility] to provide your oncology and hematology care.  To afford each patient quality time with our providers, please arrive 30 minutes before your scheduled appointment time.  If you arrive late for your appointment, you may be asked to reschedule.  We strive to give you quality time with our providers, and arriving late affects you and other patients whose appointments are after yours.   If you are a no show for multiple scheduled visits, you may be dismissed from the clinic at the providers discretion.    Again, thank you for choosing [facility], our hope is that these requests will decrease the amount of time that you wait before being seen by our physicians.  ______________________________________________________________________  Should you have questions after your visit to the [facility], please contact our office at [phone] between the hours of 8:30 and 4:30 p.m.    Voicemails left after 4:30p.m will not be returned until the following business day.    For prescription refill requests, please have your pharmacy contact us directly.  Please also try to allow 48 hours for prescription requests.    Please contact the scheduling department for questions regarding scheduling.  For scheduling of procedures such as PET scans, CT scans, MRI, Ultrasound, etc please contact central scheduling at [phone].    Resources For Cancer Patients and Caregivers:   Oncolink.org:  A wonderful resource for patients and healthcare providers for information regarding your disease, ways to tract your treatment, what to expect, etc.     American Cancer Society:  800-227-2345  Can help patients locate various types of support and financial assistance  Cancer Care: 1-800-813-HOPE (4673) Provides financial assistance, online support groups, medication/co-pay assistance.    [place] DSS:  336-641-3447 Where to apply for food  stamps, Medicaid, and utility assistance  Medicare Rights Center: 800-333-4114 Helps people with Medicare understand their rights and benefits, navigate the Medicare system, and secure the quality healthcare they deserve  SCAT: 336-333-6589 [employer]'s shared-ride transportation service for eligible riders who have a disability that prevents them from riding the fixed route bus.    For additional information on assistance programs please contact our social worker:   Grier Hock/Abigail Elmore:  [phone]            

## 2017-06-05 LAB — KAPPA/LAMBDA LIGHT CHAINS
KAPPA, LAMDA LIGHT CHAIN RATIO: 0.67 (ref 0.26–1.65)
Kappa free light chain: 26.8 mg/L — ABNORMAL HIGH (ref 3.3–19.4)
Lambda free light chains: 40 mg/L — ABNORMAL HIGH (ref 5.7–26.3)

## 2017-06-07 DIAGNOSIS — R110 Nausea: Secondary | ICD-10-CM | POA: Diagnosis not present

## 2017-06-07 DIAGNOSIS — N049 Nephrotic syndrome with unspecified morphologic changes: Secondary | ICD-10-CM | POA: Diagnosis not present

## 2017-06-07 DIAGNOSIS — D490 Neoplasm of unspecified behavior of digestive system: Secondary | ICD-10-CM | POA: Diagnosis not present

## 2017-06-07 DIAGNOSIS — I129 Hypertensive chronic kidney disease with stage 1 through stage 4 chronic kidney disease, or unspecified chronic kidney disease: Secondary | ICD-10-CM | POA: Diagnosis not present

## 2017-06-07 DIAGNOSIS — N183 Chronic kidney disease, stage 3 (moderate): Secondary | ICD-10-CM | POA: Diagnosis not present

## 2017-06-07 LAB — MULTIPLE MYELOMA PANEL, SERUM
ALBUMIN SERPL ELPH-MCNC: 3.1 g/dL (ref 2.9–4.4)
Albumin/Glob SerPl: 1 (ref 0.7–1.7)
Alpha 1: 0.3 g/dL (ref 0.0–0.4)
Alpha2 Glob SerPl Elph-Mcnc: 1 g/dL (ref 0.4–1.0)
B-GLOBULIN SERPL ELPH-MCNC: 1.1 g/dL (ref 0.7–1.3)
Gamma Glob SerPl Elph-Mcnc: 1 g/dL (ref 0.4–1.8)
Globulin, Total: 3.3 g/dL (ref 2.2–3.9)
IGA: 144 mg/dL (ref 61–437)
IGM (IMMUNOGLOBULIN M), SRM: 41 mg/dL (ref 15–143)
IgG (Immunoglobin G), Serum: 1116 mg/dL (ref 700–1600)
M Protein SerPl Elph-Mcnc: 0.5 g/dL — ABNORMAL HIGH
TOTAL PROTEIN ELP: 6.4 g/dL (ref 6.0–8.5)

## 2017-06-14 ENCOUNTER — Telehealth: Payer: Self-pay | Admitting: Hematology

## 2017-06-14 NOTE — Telephone Encounter (Signed)
Returned call to patient in order to confirm appointment for 5/10

## 2017-06-15 DIAGNOSIS — Z9481 Bone marrow transplant status: Secondary | ICD-10-CM | POA: Diagnosis not present

## 2017-06-15 DIAGNOSIS — Z888 Allergy status to other drugs, medicaments and biological substances status: Secondary | ICD-10-CM | POA: Diagnosis not present

## 2017-06-15 DIAGNOSIS — E8589 Other amyloidosis: Secondary | ICD-10-CM | POA: Diagnosis not present

## 2017-06-15 DIAGNOSIS — E859 Amyloidosis, unspecified: Secondary | ICD-10-CM | POA: Diagnosis not present

## 2017-06-15 DIAGNOSIS — R2681 Unsteadiness on feet: Secondary | ICD-10-CM | POA: Diagnosis not present

## 2017-06-15 DIAGNOSIS — R2689 Other abnormalities of gait and mobility: Secondary | ICD-10-CM | POA: Diagnosis not present

## 2017-06-21 DIAGNOSIS — Z125 Encounter for screening for malignant neoplasm of prostate: Secondary | ICD-10-CM | POA: Diagnosis not present

## 2017-06-21 DIAGNOSIS — E7849 Other hyperlipidemia: Secondary | ICD-10-CM | POA: Diagnosis not present

## 2017-06-21 DIAGNOSIS — I10 Essential (primary) hypertension: Secondary | ICD-10-CM | POA: Diagnosis not present

## 2017-06-21 DIAGNOSIS — R7301 Impaired fasting glucose: Secondary | ICD-10-CM | POA: Diagnosis not present

## 2017-06-21 DIAGNOSIS — R82998 Other abnormal findings in urine: Secondary | ICD-10-CM | POA: Diagnosis not present

## 2017-06-28 DIAGNOSIS — Z9481 Bone marrow transplant status: Secondary | ICD-10-CM | POA: Diagnosis not present

## 2017-06-28 DIAGNOSIS — Z0000 Encounter for general adult medical examination without abnormal findings: Secondary | ICD-10-CM | POA: Diagnosis not present

## 2017-06-28 DIAGNOSIS — N049 Nephrotic syndrome with unspecified morphologic changes: Secondary | ICD-10-CM | POA: Diagnosis not present

## 2017-06-28 DIAGNOSIS — E7849 Other hyperlipidemia: Secondary | ICD-10-CM | POA: Diagnosis not present

## 2017-06-28 DIAGNOSIS — I10 Essential (primary) hypertension: Secondary | ICD-10-CM | POA: Diagnosis not present

## 2017-06-28 DIAGNOSIS — E8581 Light chain (AL) amyloidosis: Secondary | ICD-10-CM | POA: Diagnosis not present

## 2017-06-28 DIAGNOSIS — I350 Nonrheumatic aortic (valve) stenosis: Secondary | ICD-10-CM | POA: Diagnosis not present

## 2017-06-28 DIAGNOSIS — Z6826 Body mass index (BMI) 26.0-26.9, adult: Secondary | ICD-10-CM | POA: Diagnosis not present

## 2017-06-28 DIAGNOSIS — R7301 Impaired fasting glucose: Secondary | ICD-10-CM | POA: Diagnosis not present

## 2017-06-28 DIAGNOSIS — I2510 Atherosclerotic heart disease of native coronary artery without angina pectoris: Secondary | ICD-10-CM | POA: Diagnosis not present

## 2017-07-06 ENCOUNTER — Ambulatory Visit: Payer: Medicare HMO | Admitting: Cardiovascular Disease

## 2017-07-19 DIAGNOSIS — Z888 Allergy status to other drugs, medicaments and biological substances status: Secondary | ICD-10-CM | POA: Diagnosis not present

## 2017-07-19 DIAGNOSIS — Z9484 Stem cells transplant status: Secondary | ICD-10-CM | POA: Diagnosis not present

## 2017-07-19 DIAGNOSIS — Z1379 Encounter for other screening for genetic and chromosomal anomalies: Secondary | ICD-10-CM | POA: Diagnosis not present

## 2017-07-19 DIAGNOSIS — E8581 Light chain (AL) amyloidosis: Secondary | ICD-10-CM | POA: Diagnosis not present

## 2017-07-26 DIAGNOSIS — Z9484 Stem cells transplant status: Secondary | ICD-10-CM | POA: Diagnosis not present

## 2017-07-26 DIAGNOSIS — E8581 Light chain (AL) amyloidosis: Secondary | ICD-10-CM | POA: Diagnosis not present

## 2017-07-26 DIAGNOSIS — R439 Unspecified disturbances of smell and taste: Secondary | ICD-10-CM | POA: Diagnosis not present

## 2017-07-26 DIAGNOSIS — R682 Dry mouth, unspecified: Secondary | ICD-10-CM | POA: Diagnosis not present

## 2017-08-24 ENCOUNTER — Telehealth: Payer: Self-pay | Admitting: Hematology

## 2017-08-24 NOTE — Telephone Encounter (Signed)
Called pt re appt being moved due to pal - spoke w/ pt re appts.

## 2017-09-01 ENCOUNTER — Ambulatory Visit: Payer: Medicare HMO | Admitting: Hematology

## 2017-09-01 ENCOUNTER — Ambulatory Visit: Payer: Medicare HMO

## 2017-09-01 ENCOUNTER — Other Ambulatory Visit: Payer: Medicare HMO

## 2017-09-04 NOTE — Progress Notes (Signed)
HEMATOLOGY/ONCOLOGY CLINIC NOTE  Date of Service: 09/06/17   Patient Care Team: [name], MD as PCP - General (Internal Medicine) Brunetta Genera, MD as Consulting Physician (Hematology) Nephrology - Dr Madelon Lips MD Dr Henrene Pastor MD - GI  CHIEF COMPLAINTS/PURPOSE OF CONSULTATION:  AL Amyloidosis -with Nephrotic syndrome . No overt CHF pEF  HISTORY OF PRESENTING ILLNESS:   Alex Alesi. is a wonderful 77 y.o. male who has been referred to [place] by Dr .[name], MD /Dr Madelon Lips MD for evaluation and management of newly diagnosed Kidney AL Amyloidosis with nephrotic syndrome.  Patient has a history of hypertension, dyslipidemia, coronary artery disease, seizure disorder on Dilantin who was apparently in his usual state of health until 3 months ago when he started developing new onset lower extremity edema. Patient had a UA at the time that showed 4+ protein in 24-hour collection revealed 15 g of protein. Additional workup showed a creatinine of 0.8 with an albumin of 2.3 and negative SPEP and UPEP. Apparently had an elevated K/L SFLC ratio of 5.19.  He was urgently referred by his primary care physician to nephrology for additional evaluation. Patient notes no bleeding issues. No nosebleeds. No periorbital bleeding. No abnormal skin rashes. Has had significant NSAID exposure and use to take a fair amount of naproxen for chronic low back pain but has cut down on this.  Due to lack of clear etiology for his nephrotic syndrome the patient underwent a kidney biopsy on 08/06/2015 accession [id number] which showed AL amyloidosis, lambda immunophenotype, [place] red positive. Noted to have severe arteriosclerosis with moderate tubular interstitial scarring.  Patient was referred to [place] for further evaluation and treatment of his AL Amyloidosis, Patient notes that his leg swelling has improved with diuretic therapy.  He notes no weight loss. No night sweats. No focal  bone pains. No skin rashes. Lungs normal bleeding or bruising.  Patient notes that he had a lot of reading online and he and his wife had an extensive list of questions which were answered in detail.  He notes he has had some chronic issues with upper abdominal pain and nausea and that he follows with Dr. Henrene Pastor and is apparently being scheduled for an EGD and colonoscopy according to his report.  Denies having and lacks tongue. No chest pain and no overt new shortness of breath. Patient is uncertain if an echocardiogram has been ordered or scheduled.  INTERVAL HISTORY  Alex [name] is here for a scheduled follow-up for his AL amyloidosis. The patient had his stem cell transplant on 04/11/17. The patient's last visit with [place] was on 06/02/17. He is accompanied today by his wife. The pt reports that he is doing well overall. He last saw Dr Norma Fredrickson on July 26, 2017.   The pt reports that he is currently at his desired weight. He notes that he feels nauseous in the mornings occasionally, and takes a Compazine or Zofran to treat this. He is continuing to take his Bactrim and Acyclovir. He also notes that his feet continue to have neuropathy but it has slowly been improving. He notes that he had a vertebral fracture in 1988 which still bothers him sometimes.   The pt also notes that his energy has increased significantly from last year.   The pt notes that he has not discussed maintenance therapy with Dr. Norma Fredrickson yet.   Lab results today (09/06/17) of CBC, CMP, and Reticulocytes is as follows: all values are WNL except  for RBC at 4.09, Hgb at 12.4, HCT at 37.5, Platelets at 108k, CO2 at 30, BUN at 31, Creatinine at 1.88. Kappa/lambda 09/06/17 is pending MMP 09/06/17 is pending.  Urine Study 07/26/17 showed Protein at 2.232g.  On 07/19/17 the pt had a BM Bx exam which revealed Normocellular bone marrow (30-40%) with 2-3% plasma cells and focal amyloid deposition.   On review of systems, pt reports  eating well, increased energy levels, back pain, resolving neuropathy, and denies fevers, chills, night sweats, and any other symptoms.    MEDICAL HISTORY:  Past Medical History:  Diagnosis Date  . Amyloidosis ([place])   . Arthritis   . Basal cell carcinoma   . CAD (coronary artery disease)    Lexiscan Myoview 6/14: Normal study, no scar or ischemia, EF 62%  . Cataract   . Chronic kidney disease   . Colon polyp   . Coronary artery disease    moderate disease by cath 2011  . Diverticulosis of colon   . GERD (gastroesophageal reflux disease)   . Hearing loss   . Heart disease   . Hiatal hernia   . Hyperlipidemia   . Hypertension   . MRSA (methicillin resistant staph aureus) culture positive   . Seizure disorder ([place])   . Seizures ([place])    last in 1970s  Obstructive sleep apnea Gastroesophageal reflux disease Aortic stenosis Erectile dysfunction Peripheral neuropathy Seizure disorder   SURGICAL HISTORY: Past Surgical History:  Procedure Laterality Date  . BUNIONECTOMY    . CARPAL TUNNEL RELEASE  1994   left  . CERVICAL FUSION    . compression fracture    . FOOT SURGERY     right -twice, left foot once  . HERNIA REPAIR     Umbilical with PVP  . KNEE ARTHROSCOPY     left knee  . [place]   left - scope  . NASAL SEPTUM SURGERY    . NECK SURGERY    . NOSE SURGERY     twice  . PAROTIDECTOMY Right 03/09/2017  . ROTATOR CUFF REPAIR     twice, both shoulders  . ROTATOR CUFF REPAIR    . SHOULDER SURGERY    . SKIN BIOPSY    . SPINE SURGERY     C2, C3, C4  . TRIGGER FINGER RELEASE     both hands, twice  . TRIGGER FINGER RELEASE    . VASECTOMY      SOCIAL HISTORY: Social History   Socioeconomic History  . Marital status: Married    Spouse name: Not on file  . Number of children: 2  . Years of education: 80  . Highest education level: Not on file  Occupational History  . Occupation: Retired  Scientific laboratory technician  . Financial resource strain: Not on file   . Food insecurity:    Worry: Not on file    Inability: Not on file  . Transportation needs:    Medical: Not on file    Non-medical: Not on file  Tobacco Use  . Smoking status: Former Smoker    Last attempt to quit: 04/25/1969    Years since quitting: 48.4  . Smokeless tobacco: Former Network engineer and Sexual Activity  . Alcohol use: Yes    Alcohol/week: 4.2 - 8.4 oz    Types: 7 - 14 Glasses of wine per week    Comment: socially shots of wiskey  . Drug use: No  . Sexual activity: Not on file  Lifestyle  .  Physical activity:    Days per week: Not on file    Minutes per session: Not on file  . Stress: Not on file  Relationships  . Social connections:    Talks on phone: Not on file    Gets together: Not on file    Attends religious service: Not on file    Active member of club or organization: Not on file    Attends meetings of clubs or organizations: Not on file    Relationship status: Not on file  . Intimate partner violence:    Fear of current or ex partner: Not on file    Emotionally abused: Not on file    Physically abused: Not on file    Forced sexual activity: Not on file  Other Topics Concern  . Not on file  Social History Narrative   ** Merged History Encounter **   Lives at home w/ his wife   Right-handed   Caffeine: occasional Pepsi      Patient is currently retired.   FAMILY HISTORY: Family History  Problem Relation Age of Onset  . Diabetes Father   . Heart disease Father   . Heart attack Father 74  . Hypertension Father   . Cancer Sister   . Hearing loss Mother   . Eczema Sister   . Hyperlipidemia Unknown   . Diabetes Unknown   . Hypertension Unknown   . Seizures Unknown   . Thyroid disease Unknown   . Colon cancer Neg Hx   . Stroke Neg Hx     ALLERGIES:  is allergic to ramipril.  MEDICATIONS:  Current Outpatient Medications  Medication Sig Dispense Refill  . acyclovir (ZOVIRAX) 400 MG tablet Take 1 tablet (400 mg total) by mouth 2  (two) times daily. 60 tablet 3  . Ascorbic Acid (VITAMIN C) 1000 MG tablet Take 1,000 mg by mouth daily.    [name] Kitchen aspirin EC 81 MG tablet Take 1 tablet by mouth daily.    [name] Kitchen atorvastatin (LIPITOR) 40 MG tablet Take 40 mg by mouth at bedtime.     . cholecalciferol (VITAMIN D) 1000 units tablet Take 1,000 Units by mouth daily.    . clindamycin (CLEOCIN T) 1 % external solution Apply 1 application topically 2 (two) times daily as needed. For bacteria    . ezetimibe (ZETIA) 10 MG tablet Take 1 tablet by mouth daily.    . fluticasone (FLONASE) 50 MCG/ACT nasal spray Place 2 sprays into both nostrils as needed for rhinitis or allergies.    . folic acid (FOLVITE) 1 MG tablet Take 1 tablet by mouth daily.    . isosorbide mononitrate (IMDUR) 30 MG 24 hr tablet Take 30 mg by mouth daily.    . ixazomib citrate (NINLARO) 4 MG capsule Take 1 capsule (4 mg total) by mouth once a week. On D1, D8 and D15 every 28 days.Take on an empty stomach 1hr before or 2hrs after food. (Patient not taking: Reported on 05/02/2017) 3 capsule 3  . losartan (COZAAR) 100 MG tablet Take 100 mg by mouth daily.    . magic mouthwash w/lidocaine SOLN Take 5 mLs by mouth 4 (four) times daily as needed for mouth pain. 1 part viscous lidocaine 2%; 1 part maalox; 1 part diphenhydramine 12.70m per 557melixir 200 mL 0  . Multiple Vitamin (MULTI-VITAMINS) TABS Take 1 tablet by mouth daily.    . mupirocin cream (BACTROBAN) 2 % Apply 1 application topically 2 (two) times daily. (Patient taking differently: Apply  1 application topically 2 (two) times daily as needed (for rash/irritation). ) 30 g 0  . nitroGLYCERIN (NITROSTAT) 0.4 MG SL tablet Place 1 tablet (0.4 mg total) under the tongue every 5 (five) minutes as needed for chest pain. 25 tablet 6  . omeprazole (PRILOSEC) 20 MG capsule Take 20 mg by mouth daily.      . ondansetron (ZOFRAN) 8 MG tablet Take 1 tablet (8 mg total) by mouth 2 (two) times daily as needed for refractory nausea / vomiting.  Start on D 4 and 11. Do not take on D8. 30 tablet 1  . oxyCODONE (OXY IR/ROXICODONE) 5 MG immediate release tablet Take 1 tablet by mouth every 6 (six) hours as needed for severe pain.    . potassium chloride SA (K-DUR,KLOR-CON) 20 MEQ tablet Take 20 mEq by mouth at bedtime.     . prochlorperazine (COMPAZINE) 10 MG tablet Take 1 tablet (10 mg total) by mouth every 6 (six) hours as needed (Nausea or vomiting). 30 tablet 1  . sulfamethoxazole-trimethoprim (BACTRIM DS,SEPTRA DS) 800-160 MG tablet Take 1 tablet by mouth every Monday, Wednesday, and Friday.    . triamcinolone cream (KENALOG) 0.1 % Apply 1 application topically as needed for rash.     No current facility-administered medications for this visit.     REVIEW OF SYSTEMS:    A 10+ POINT REVIEW OF SYSTEMS WAS OBTAINED including neurology, dermatology, psychiatry, cardiac, respiratory, lymph, extremities, GI, GU, Musculoskeletal, constitutional, breasts, reproductive, HEENT.  All pertinent positives are noted in the HPI.  All others are negative.   PHYSICAL EXAMINATION: ECOG PERFORMANCE STATUS: 1 - Symptomatic but completely ambulatory  . Vitals:   09/06/17 1345  BP: 121/67  Pulse: 68  Resp: 18  Temp: 97.7 F (36.5 C)  SpO2: 100%   Filed Weights   09/06/17 1345  Weight: 192 lb 8 oz (87.3 kg)   .Body mass index is 26.85 kg/m.  GENERAL:alert, in no acute distress and comfortable SKIN: no acute rashes, no significant lesions EYES: conjunctiva are pink and non-injected, sclera anicteric OROPHARYNX: MMM, no exudates, no oropharyngeal erythema or ulceration NECK: supple, no JVD LYMPH:  no palpable lymphadenopathy in the cervical, axillary or inguinal regions LUNGS: clear to auscultation b/l with normal respiratory effort HEART: regular rate & rhythm ABDOMEN:  normoactive bowel sounds , non tender, not distended. Extremity: no pedal edema PSYCH: alert & oriented x 3 with fluent speech NEURO: no focal motor/sensory deficits     LABORATORY DATA:  I have reviewed the data as listed  . CBC Latest Ref Rng & Units 09/06/2017 06/02/2017 05/01/2017  WBC 4.0 - 10.3 K/uL 6.3 7.3 11.4(H)  Hemoglobin 13.0 - 17.1 g/dL 12.4(L) 12.5(L) 11.2(L)  Hematocrit 38.4 - 49.9 % 37.5(L) 36.8(L) 33.1(L)  Platelets 140 - 400 K/uL 108(L) 114(L) 175    CMP Latest Ref Rng & Units 09/06/2017 06/02/2017 05/01/2017  Glucose 70 - 140 mg/dL 121 107 99  BUN 7 - 26 mg/dL 31(H) 18 19  Creatinine 0.70 - 1.30 mg/dL 1.88(H) 1.38(H) 1.15  Sodium 136 - 145 mmol/L 142 140 140  Potassium 3.5 - 5.1 mmol/L 4.2 4.1 3.2(L)  Chloride 98 - 109 mmol/L 105 107 103  CO2 22 - 29 mmol/L 30(H) 25 27  Calcium 8.4 - 10.4 mg/dL 8.9 9.2 8.8  Total Protein 6.4 - 8.3 g/dL 6.6 6.8 6.0(L)  Total Bilirubin 0.2 - 1.2 mg/dL 0.7 0.6 0.4  Alkaline Phos 40 - 150 U/L 81 72 167(H)  AST 5 - 34  U/L 32 23 27  ALT 0 - 55 U/L _0 RADIOGRAPHIC STUDIES: I have personally reviewed the radiological images as listed and agreed with the findings in the report. No results found. Result status: Edited Result - FINAL                              *[facility] [employer].                        [place], [place] [address]                            [phone]  ------------------------------------------------------------------- Transthoracic Echocardiography  (Report amended )  Patient:    Alex [name], Alex [name] Alex #:       [id number] Study Date: 08/24/2016 Gender:     M Age:        72 Height:     180.3 cm Weight:     90.6 kg BSA:        2.15 m^2 Pt. Status: Room:   ATTENDING    Darlina Guys, MD  Willia Craze, [place]  REFERRING    [place], [place], Outpatient  SONOGRAPHER  Roseanna Rainbow  cc:  ------------------------------------------------------------------- LV EF: 65% -    70%  ------------------------------------------------------------------- Indications:      Aortic stenosis 424.1.  ------------------------------------------------------------------- History:   PMH:  Amyloidosis.  Coronary artery disease.  Angina pectoris.  Risk factors:  Hypertension. Dyslipidemia.  ------------------------------------------------------------------- Study Conclusions  - Left ventricle: The cavity size was normal. Wall thickness was   increased in a pattern of mild LVH. Systolic function was   vigorous. The estimated ejection fraction was in the range of 65%   to 70%. Wall motion was normal; there were no regional wall   motion abnormalities. Doppler parameters are consistent with   abnormal left ventricular relaxation (grade 1 diastolic   dysfunction). - Aortic valve: Valve mobility was mildly restricted. - Aortic root: The aortic root was mildly dilated. - Mitral valve: Calcified annulus.  Impressions:  - Vigorous LV systolic function; mild diastolic dysfunction; mild   LVH; calcified aortic valve with no significant AS (peak velocity   2 m/s; mean gradient 9 mmHg); mildly dilated aortic root.    ASSESSMENT & PLAN:   77 y.o.  male with above-mentioned multiple medical comorbidities with  #1 Biopsy proven Renal and Systemic AL Amyloidosis Initial BM Bx shows 9% plasma cells and amyloid deposits. Echo shows normal systolic function with only grade 1 diastolic dysfunction. Skeletal survey with no lytic lesions suggestive of multiple myeloma. Serum K/L ratio increased at about 6.22 on diagnosis and has now improved to 1.5 (wnl with normal kappa lambda serum free light chain ratio ) UPEP shows 24h protein improved from 15g to 5g daily and the last recent one is about 7.3 g /24h  #2  Nephrotic Syndrome - likely related to AL amyloidosis.  UPEP shows 24h protein improved from 15g to 5g daily and the last recent one is about 7.3 g /  24h  Plan  -Discussed  pt labwork today, 09/06/17; blood counts and chemistries are stable.  -Pt is not in nephrotic range; Urine Study 07/26/17 showed Protein at 2.232g.  -07/19/17 the pt had a BM Bx exam which revealed Normocellular bone marrow (30-40%) with 2-3% plasma cells and focal amyloid deposition. -starting onVelcade maintenance as per recommendations from his transplant team and [facility]. -We will see pt back in 4 weeks -Continue with scheduled vaccinations with Dr. Norma Fredrickson  -Will continue to watch Grade 1 neuropathy   #3 Mild diarrhea - likely from mucositis No blood/mucous/tenesmus. resolved Recent c diff neg -monitor. -prn imodium as he is current doing  # 4 s/p HCAP resolved.  #5 Patient Active Problem List   Diagnosis Date Noted  . Folliculitis 41/66/0630  . AL amyloidosis ([place]) 08/21/2016  . Umbilical hernia 16/04/930  . DIZZINESS 02/19/2010  . CAD, NATIVE VESSEL 07/21/2009  . CHEST PAIN-UNSPECIFIED 07/21/2009  . HYPERLIPIDEMIA 04/30/2007  . HYPERTENSION 04/30/2007  . GASTROESOPHAGEAL REFLUX DISEASE 04/30/2007  . SEIZURE DISORDER 04/30/2007  . COMPRESSION FRACTURE, L1 VERTEBRA 04/30/2007  . DIVERTICULOSIS, COLON, HX OF 04/30/2007  . Other postprocedural status(V45.89) 04/30/2007  -Continue follow-up with primary care physician for management of other medical co-morbidities. -Recommend if pt experiences spike fever to report to hospital with transplant team    Continue Velcade q2 weeks -- plz schedule 4 doses RTC with Dr [name] in 4 weeks with labs   All of the patients and his wife's questions were answered to their apparent satisfaction. They are agreeable with the plan as noted above .The patient knows to call the clinic with any problems, questions or concerns.  The toal time spent in the appt was 30 minutes and more than 50% was on counseling and direct patient cares.   Sullivan Lone MD [place] AAHIVMS [facility] Hematology/Oncology Physician [facility]  (Office):        [phone] (Work cell):  [phone] (Fax):           [phone]  This document serves as a record of services personally performed by Sullivan Lone, MD. It was created on his behalf by Baldwin Jamaica, a trained medical scribe. The creation of this record is based on the scribe's personal observations and the provider's statements to them.   .I have reviewed the above documentation for accuracy and completeness, and I agree with the above. Brunetta Genera MD MS

## 2017-09-06 ENCOUNTER — Other Ambulatory Visit: Payer: Self-pay

## 2017-09-06 ENCOUNTER — Inpatient Hospital Stay: Payer: Medicare HMO | Attending: Hematology

## 2017-09-06 ENCOUNTER — Telehealth: Payer: Self-pay

## 2017-09-06 ENCOUNTER — Inpatient Hospital Stay: Payer: Medicare HMO

## 2017-09-06 ENCOUNTER — Inpatient Hospital Stay (HOSPITAL_BASED_OUTPATIENT_CLINIC_OR_DEPARTMENT_OTHER): Payer: Medicare HMO | Admitting: Hematology

## 2017-09-06 VITALS — BP 121/67 | HR 68 | Temp 97.7°F | Resp 18 | Ht 71.0 in | Wt 192.5 lb

## 2017-09-06 DIAGNOSIS — Z5112 Encounter for antineoplastic immunotherapy: Secondary | ICD-10-CM | POA: Diagnosis present

## 2017-09-06 DIAGNOSIS — G629 Polyneuropathy, unspecified: Secondary | ICD-10-CM | POA: Diagnosis not present

## 2017-09-06 DIAGNOSIS — N049 Nephrotic syndrome with unspecified morphologic changes: Secondary | ICD-10-CM

## 2017-09-06 DIAGNOSIS — E8581 Light chain (AL) amyloidosis: Secondary | ICD-10-CM | POA: Insufficient documentation

## 2017-09-06 DIAGNOSIS — D696 Thrombocytopenia, unspecified: Secondary | ICD-10-CM

## 2017-09-06 LAB — CMP (CANCER CENTER ONLY)
ALBUMIN: 3.7 g/dL (ref 3.5–5.0)
ALT: 23 U/L (ref 0–55)
AST: 32 U/L (ref 5–34)
Alkaline Phosphatase: 81 U/L (ref 40–150)
Anion gap: 7 (ref 3–11)
BUN: 31 mg/dL — ABNORMAL HIGH (ref 7–26)
CHLORIDE: 105 mmol/L (ref 98–109)
CO2: 30 mmol/L — AB (ref 22–29)
Calcium: 8.9 mg/dL (ref 8.4–10.4)
Creatinine: 1.88 mg/dL — ABNORMAL HIGH (ref 0.70–1.30)
GFR, EST AFRICAN AMERICAN: 39 mL/min — AB (ref >60)
GFR, Estimated: 34 mL/min — ABNORMAL LOW (ref >60)
GLUCOSE: 121 mg/dL (ref 70–140)
Potassium: 4.2 mmol/L (ref 3.5–5.1)
SODIUM: 142 mmol/L (ref 136–145)
Total Bilirubin: 0.7 mg/dL (ref 0.2–1.2)
Total Protein: 6.6 g/dL (ref 6.4–8.3)

## 2017-09-06 LAB — PROTEIN / CREATININE RATIO, URINE
CREATININE, URINE: 60.87 mg/dL
Protein Creatinine Ratio: 0.95 mg/mg{Cre} — ABNORMAL HIGH (ref 0.00–0.15)
Total Protein, Urine: 58 mg/dL

## 2017-09-06 LAB — CBC WITH DIFFERENTIAL (CANCER CENTER ONLY)
BASOS PCT: 0 %
Basophils Absolute: 0 10*3/uL (ref 0.0–0.1)
Eosinophils Absolute: 0.2 10*3/uL (ref 0.0–0.5)
Eosinophils Relative: 3 %
HCT: 37.5 % — ABNORMAL LOW (ref 38.4–49.9)
HEMOGLOBIN: 12.4 g/dL — AB (ref 13.0–17.1)
LYMPHS PCT: 23 %
Lymphs Abs: 1.4 10*3/uL (ref 0.9–3.3)
MCH: 30.3 pg (ref 27.2–33.4)
MCHC: 33.1 g/dL (ref 32.0–36.0)
MCV: 91.7 fL (ref 79.3–98.0)
MONOS PCT: 6 %
Monocytes Absolute: 0.4 10*3/uL (ref 0.1–0.9)
NEUTROS PCT: 68 %
Neutro Abs: 4.3 10*3/uL (ref 1.5–6.5)
Platelet Count: 108 10*3/uL — ABNORMAL LOW (ref 140–400)
RBC: 4.09 MIL/uL — ABNORMAL LOW (ref 4.20–5.82)
RDW: 13.7 % (ref 11.0–14.6)
WBC Count: 6.3 10*3/uL (ref 4.0–10.3)

## 2017-09-06 LAB — RETICULOCYTES
RBC.: 4.09 MIL/uL — AB (ref 4.20–5.82)
Retic Count, Absolute: 65.4 10*3/uL (ref 34.8–93.9)
Retic Ct Pct: 1.6 % (ref 0.8–1.8)

## 2017-09-06 MED ORDER — ONDANSETRON HCL 8 MG PO TABS
ORAL_TABLET | ORAL | Status: AC
  Filled 2017-09-06: qty 1

## 2017-09-06 MED ORDER — BORTEZOMIB CHEMO SQ INJECTION 3.5 MG (2.5MG/ML)
1.3000 mg/m2 | Freq: Once | INTRAMUSCULAR | Status: AC
  Administered 2017-09-06: 2.75 mg via SUBCUTANEOUS
  Filled 2017-09-06: qty 2.75

## 2017-09-06 MED ORDER — ONDANSETRON HCL 8 MG PO TABS
8.0000 mg | ORAL_TABLET | Freq: Once | ORAL | Status: AC
  Administered 2017-09-06: 8 mg via ORAL

## 2017-09-06 MED ORDER — DEXAMETHASONE 4 MG PO TABS
ORAL_TABLET | ORAL | Status: AC
  Filled 2017-09-06: qty 10

## 2017-09-06 MED ORDER — DEXAMETHASONE 4 MG PO TABS
40.0000 mg | ORAL_TABLET | Freq: Once | ORAL | Status: AC
  Administered 2017-09-06: 40 mg via ORAL

## 2017-09-06 NOTE — Patient Instructions (Signed)
[facility] Discharge Instructions for Patients Receiving Chemotherapy  Today you received the following chemotherapy agents Velcade.  To help prevent nausea and vomiting after your treatment, we encourage you to take your nausea medication as directed.  If you develop nausea and vomiting that is not controlled by your nausea medication, call the clinic.   BELOW ARE SYMPTOMS THAT SHOULD BE REPORTED IMMEDIATELY:  *FEVER GREATER THAN 100.5 F  *CHILLS WITH OR WITHOUT FEVER  NAUSEA AND VOMITING THAT IS NOT CONTROLLED WITH YOUR NAUSEA MEDICATION  *UNUSUAL SHORTNESS OF BREATH  *UNUSUAL BRUISING OR BLEEDING  TENDERNESS IN MOUTH AND THROAT WITH OR WITHOUT PRESENCE OF ULCERS  *URINARY PROBLEMS  *BOWEL PROBLEMS  UNUSUAL RASH Items with * indicate a potential emergency and should be followed up as soon as possible.  Feel free to call the clinic should you have any questions or concerns. The clinic phone number is [phone].  Please show the CHEMO ALERT CARD at check-in to the Emergency Department and triage nurse.   

## 2017-09-06 NOTE — Progress Notes (Signed)
Dr. [name] ok to tx with ctn 1.88.

## 2017-09-06 NOTE — Telephone Encounter (Signed)
Notified patient that no decadron order required at this time. Pt to call with any further questions.

## 2017-09-06 NOTE — Progress Notes (Signed)
Pt does not require decadron with maintenance velcade per Dr. [name]. Spoke with Eliezer Lofts, [facility] and note removed from tx plan. Called pt and LVM that no home prescription required at this time.

## 2017-09-06 NOTE — Progress Notes (Signed)
Per Dr. Kale, okay to treat pt with abnormal labs.  

## 2017-09-07 DIAGNOSIS — M5489 Other dorsalgia: Secondary | ICD-10-CM | POA: Diagnosis not present

## 2017-09-07 DIAGNOSIS — Z6828 Body mass index (BMI) 28.0-28.9, adult: Secondary | ICD-10-CM | POA: Diagnosis not present

## 2017-09-07 LAB — KAPPA/LAMBDA LIGHT CHAINS
KAPPA FREE LGHT CHN: 21.9 mg/L — AB (ref 3.3–19.4)
Kappa, lambda light chain ratio: 1.21 (ref 0.26–1.65)
LAMDA FREE LIGHT CHAINS: 18.1 mg/L (ref 5.7–26.3)

## 2017-09-12 LAB — MULTIPLE MYELOMA PANEL, SERUM
Albumin SerPl Elph-Mcnc: 3.4 g/dL (ref 2.9–4.4)
Albumin/Glob SerPl: 1.3 (ref 0.7–1.7)
Alpha 1: 0.2 g/dL (ref 0.0–0.4)
Alpha2 Glob SerPl Elph-Mcnc: 0.9 g/dL (ref 0.4–1.0)
B-Globulin SerPl Elph-Mcnc: 0.9 g/dL (ref 0.7–1.3)
GAMMA GLOB SERPL ELPH-MCNC: 0.7 g/dL (ref 0.4–1.8)
GLOBULIN, TOTAL: 2.8 g/dL (ref 2.2–3.9)
IGA: 69 mg/dL (ref 61–437)
IgG (Immunoglobin G), Serum: 859 mg/dL (ref 700–1600)
IgM (Immunoglobulin M), Srm: 22 mg/dL (ref 15–143)
M PROTEIN SERPL ELPH-MCNC: 0.2 g/dL — AB
Total Protein ELP: 6.2 g/dL (ref 6.0–8.5)

## 2017-09-13 DIAGNOSIS — K119 Disease of salivary gland, unspecified: Secondary | ICD-10-CM | POA: Diagnosis not present

## 2017-09-13 DIAGNOSIS — Z9889 Other specified postprocedural states: Secondary | ICD-10-CM | POA: Diagnosis not present

## 2017-09-13 DIAGNOSIS — Z888 Allergy status to other drugs, medicaments and biological substances status: Secondary | ICD-10-CM | POA: Diagnosis not present

## 2017-09-13 DIAGNOSIS — D110 Benign neoplasm of parotid gland: Secondary | ICD-10-CM | POA: Diagnosis not present

## 2017-09-19 ENCOUNTER — Telehealth: Payer: Self-pay

## 2017-09-19 NOTE — Telephone Encounter (Signed)
Pt called requesting update on velcade appt. Told pt that lab at 0930 tomorrow morning, followed by velcade injection at 1030. Pt aware that this will be in a room in [facility], not infusion, due to high pt volume. Pt knows to expect nurse or NT to come get him from the lobby for treatment.

## 2017-09-20 ENCOUNTER — Inpatient Hospital Stay: Payer: Medicare HMO

## 2017-09-20 ENCOUNTER — Inpatient Hospital Stay: Payer: Medicare HMO | Admitting: Hematology

## 2017-09-20 ENCOUNTER — Other Ambulatory Visit: Payer: Self-pay

## 2017-09-20 VITALS — BP 117/71 | HR 66 | Temp 98.1°F | Resp 18 | Wt 190.8 lb

## 2017-09-20 DIAGNOSIS — E8581 Light chain (AL) amyloidosis: Secondary | ICD-10-CM | POA: Diagnosis not present

## 2017-09-20 DIAGNOSIS — G629 Polyneuropathy, unspecified: Secondary | ICD-10-CM | POA: Diagnosis not present

## 2017-09-20 DIAGNOSIS — Z5112 Encounter for antineoplastic immunotherapy: Secondary | ICD-10-CM | POA: Diagnosis not present

## 2017-09-20 DIAGNOSIS — N049 Nephrotic syndrome with unspecified morphologic changes: Secondary | ICD-10-CM | POA: Diagnosis not present

## 2017-09-20 LAB — CBC WITH DIFFERENTIAL (CANCER CENTER ONLY)
BASOS PCT: 0 %
Basophils Absolute: 0 10*3/uL (ref 0.0–0.1)
EOS ABS: 0.1 10*3/uL (ref 0.0–0.5)
Eosinophils Relative: 1 %
HCT: 37 % — ABNORMAL LOW (ref 38.4–49.9)
Hemoglobin: 12.4 g/dL — ABNORMAL LOW (ref 13.0–17.1)
Lymphocytes Relative: 15 %
Lymphs Abs: 1.1 10*3/uL (ref 0.9–3.3)
MCH: 30.5 pg (ref 27.2–33.4)
MCHC: 33.5 g/dL (ref 32.0–36.0)
MCV: 91.1 fL (ref 79.3–98.0)
MONO ABS: 0.5 10*3/uL (ref 0.1–0.9)
MONOS PCT: 6 %
Neutro Abs: 5.9 10*3/uL (ref 1.5–6.5)
Neutrophils Relative %: 78 %
Platelet Count: 114 10*3/uL — ABNORMAL LOW (ref 140–400)
RBC: 4.06 MIL/uL — ABNORMAL LOW (ref 4.20–5.82)
RDW: 14.4 % (ref 11.0–14.6)
WBC Count: 7.6 10*3/uL (ref 4.0–10.3)

## 2017-09-20 LAB — CMP (CANCER CENTER ONLY)
ALK PHOS: 69 U/L (ref 40–150)
ALT: 23 U/L (ref 0–55)
AST: 27 U/L (ref 5–34)
Albumin: 3.7 g/dL (ref 3.5–5.0)
Anion gap: 11 (ref 3–11)
BILIRUBIN TOTAL: 0.6 mg/dL (ref 0.2–1.2)
BUN: 30 mg/dL — ABNORMAL HIGH (ref 7–26)
CALCIUM: 8.9 mg/dL (ref 8.4–10.4)
CO2: 25 mmol/L (ref 22–29)
CREATININE: 1.73 mg/dL — AB (ref 0.70–1.30)
Chloride: 106 mmol/L (ref 98–109)
GFR, Est AFR Am: 43 mL/min — ABNORMAL LOW (ref >60)
GFR, Estimated: 37 mL/min — ABNORMAL LOW (ref >60)
Glucose, Bld: 91 mg/dL (ref 70–140)
Potassium: 3.7 mmol/L (ref 3.5–5.1)
SODIUM: 142 mmol/L (ref 136–145)
Total Protein: 6.4 g/dL (ref 6.4–8.3)

## 2017-09-20 MED ORDER — BORTEZOMIB CHEMO SQ INJECTION 3.5 MG (2.5MG/ML)
1.3000 mg/m2 | Freq: Once | INTRAMUSCULAR | Status: AC
  Administered 2017-09-20: 2.75 mg via SUBCUTANEOUS
  Filled 2017-09-20: qty 2.75

## 2017-09-20 MED ORDER — ONDANSETRON HCL 8 MG PO TABS
ORAL_TABLET | ORAL | Status: AC
  Filled 2017-09-20: qty 1

## 2017-09-20 MED ORDER — ONDANSETRON HCL 8 MG PO TABS
8.0000 mg | ORAL_TABLET | Freq: Once | ORAL | Status: AC
  Administered 2017-09-20: 8 mg via ORAL

## 2017-09-20 NOTE — Patient Instructions (Signed)
[facility] Discharge Instructions for Patients Receiving Chemotherapy  Today you received the following chemotherapy agents Velcade.  To help prevent nausea and vomiting after your treatment, we encourage you to take your nausea medication as directed.  If you develop nausea and vomiting that is not controlled by your nausea medication, call the clinic.   BELOW ARE SYMPTOMS THAT SHOULD BE REPORTED IMMEDIATELY:  *FEVER GREATER THAN 100.5 F  *CHILLS WITH OR WITHOUT FEVER  NAUSEA AND VOMITING THAT IS NOT CONTROLLED WITH YOUR NAUSEA MEDICATION  *UNUSUAL SHORTNESS OF BREATH  *UNUSUAL BRUISING OR BLEEDING  TENDERNESS IN MOUTH AND THROAT WITH OR WITHOUT PRESENCE OF ULCERS  *URINARY PROBLEMS  *BOWEL PROBLEMS  UNUSUAL RASH Items with * indicate a potential emergency and should be followed up as soon as possible.  Feel free to call the clinic should you have any questions or concerns. The clinic phone number is [phone].  Please show the CHEMO ALERT CARD at check-in to the Emergency Department and triage nurse.   

## 2017-09-20 NOTE — Progress Notes (Signed)
Okay to treat today with creatinine of 1.73 per Dr. [name].

## 2017-09-21 LAB — KAPPA/LAMBDA LIGHT CHAINS
Kappa free light chain: 16.2 mg/L (ref 3.3–19.4)
Kappa, lambda light chain ratio: 1.13 (ref 0.26–1.65)
LAMDA FREE LIGHT CHAINS: 14.4 mg/L (ref 5.7–26.3)

## 2017-09-26 LAB — MULTIPLE MYELOMA PANEL, SERUM
ALBUMIN SERPL ELPH-MCNC: 3.4 g/dL (ref 2.9–4.4)
Albumin/Glob SerPl: 1.4 (ref 0.7–1.7)
Alpha 1: 0.2 g/dL (ref 0.0–0.4)
Alpha2 Glob SerPl Elph-Mcnc: 0.9 g/dL (ref 0.4–1.0)
B-Globulin SerPl Elph-Mcnc: 0.9 g/dL (ref 0.7–1.3)
Gamma Glob SerPl Elph-Mcnc: 0.6 g/dL (ref 0.4–1.8)
Globulin, Total: 2.5 g/dL (ref 2.2–3.9)
IGA: 52 mg/dL — AB (ref 61–437)
IgG (Immunoglobin G), Serum: 749 mg/dL (ref 700–1600)
IgM (Immunoglobulin M), Srm: 21 mg/dL (ref 15–143)
M Protein SerPl Elph-Mcnc: 0.1 g/dL — ABNORMAL HIGH
TOTAL PROTEIN ELP: 5.9 g/dL — AB (ref 6.0–8.5)

## 2017-09-27 ENCOUNTER — Ambulatory Visit: Payer: Medicare HMO | Admitting: Cardiovascular Disease

## 2017-10-03 NOTE — Progress Notes (Signed)
HEMATOLOGY/ONCOLOGY CLINIC NOTE  Date of Service: 10/04/17   Patient Care Team: [name], MD as PCP - General (Internal Medicine) Brunetta Genera, MD as Consulting Physician (Hematology) Nephrology - Dr Madelon Lips MD Dr Henrene Pastor MD - GI  CHIEF COMPLAINTS/PURPOSE OF CONSULTATION:  AL Amyloidosis -with Nephrotic syndrome . No overt CHF pEF  HISTORY OF PRESENTING ILLNESS:   Alex [name]. is a wonderful 77 y.o. male who has been referred to [place] by Dr .[name], MD /Dr Madelon Lips MD for evaluation and management of newly diagnosed Kidney AL Amyloidosis with nephrotic syndrome.  Patient has a history of hypertension, dyslipidemia, coronary artery disease, seizure disorder on Dilantin who was apparently in his usual state of health until 3 months ago when he started developing new onset lower extremity edema. Patient had a UA at the time that showed 4+ protein in 24-hour collection revealed 15 g of protein. Additional workup showed a creatinine of 0.8 with an albumin of 2.3 and negative SPEP and UPEP. Apparently had an elevated K/L SFLC ratio of 5.19.  He was urgently referred by his primary care physician to nephrology for additional evaluation. Patient notes no bleeding issues. No nosebleeds. No periorbital bleeding. No abnormal skin rashes. Has had significant NSAID exposure and use to take a fair amount of naproxen for chronic low back pain but has cut down on this.  Due to lack of clear etiology for his nephrotic syndrome the patient underwent a kidney biopsy on 08/06/2015 accession [id number] which showed AL amyloidosis, lambda immunophenotype, [place] red positive. Noted to have severe arteriosclerosis with moderate tubular interstitial scarring.  Patient was referred to [place] for further evaluation and treatment of his AL Amyloidosis, Patient notes that his leg swelling has improved with diuretic therapy.  He notes no weight loss. No night sweats. No focal  bone pains. No skin rashes. Lungs normal bleeding or bruising.  Patient notes that he had a lot of reading online and he and his wife had an extensive list of questions which were answered in detail.  He notes he has had some chronic issues with upper abdominal pain and nausea and that he follows with Dr. Henrene Pastor and is apparently being scheduled for an EGD and colonoscopy according to his report.  Denies having and lacks tongue. No chest pain and no overt new shortness of breath. Patient is uncertain if an echocardiogram has been ordered or scheduled.  INTERVAL HISTORY  Alex [name] is here for a scheduled follow-up for his AL amyloidosis. The patient had his stem cell transplant on 04/11/17. The patient's last visit with [place] was on 09/06/17. He is accompanied today by his wife. The pt reports that he is doing well overall.   The pt reports that he is tolerating Velcade well and has mild intermittent peripheral neuropathy that he denies as being continuous or very bothersome. He continues to take potassium.   He notes that he believes a previous hernia is returning but denies coughing.   Lab results today (10/04/17) of CBC, CMP, and Reticulocytes is as follows: all values are WNL except for HGB at 12.8, RDW at 15.8, Platelets at 132k, BUN at 32, Creatinine at 1.81, GFR at 41. MMP 10/04/17 is IgG lambda M spike of 0.1g/dl Kappa/Lambda 10/04/17 is SFLC and ratio WNL  On review of systems, pt reports some abdominal pain, and denies mouth sores, leg swelling, and any other symptoms.    MEDICAL HISTORY:  Past Medical History:  Diagnosis  Date  . Amyloidosis ([place])   . Arthritis   . Basal cell carcinoma   . CAD (coronary artery disease)    Lexiscan Myoview 6/14: Normal study, no scar or ischemia, EF 62%  . Cataract   . Chronic kidney disease   . Colon polyp   . Coronary artery disease    moderate disease by cath 2011  . Diverticulosis of colon   . GERD (gastroesophageal reflux disease)   .  Hearing loss   . Heart disease   . Hiatal hernia   . Hyperlipidemia   . Hypertension   . MRSA (methicillin resistant staph aureus) culture positive   . Seizure disorder ([place])   . Seizures ([place])    last in 1970s  Obstructive sleep apnea Gastroesophageal reflux disease Aortic stenosis Erectile dysfunction Peripheral neuropathy Seizure disorder   SURGICAL HISTORY: Past Surgical History:  Procedure Laterality Date  . BUNIONECTOMY    . CARPAL TUNNEL RELEASE  1994   left  . CERVICAL FUSION    . compression fracture    . FOOT SURGERY     right -twice, left foot once  . HERNIA REPAIR     Umbilical with PVP  . KNEE ARTHROSCOPY     left knee  . [place]   left - scope  . NASAL SEPTUM SURGERY    . NECK SURGERY    . NOSE SURGERY     twice  . PAROTIDECTOMY Right 03/09/2017  . ROTATOR CUFF REPAIR     twice, both shoulders  . ROTATOR CUFF REPAIR    . SHOULDER SURGERY    . SKIN BIOPSY    . SPINE SURGERY     C2, C3, C4  . TRIGGER FINGER RELEASE     both hands, twice  . TRIGGER FINGER RELEASE    . VASECTOMY      SOCIAL HISTORY: Social History   Socioeconomic History  . Marital status: Married    Spouse name: Not on file  . Number of children: 2  . Years of education: 70  . Highest education level: Not on file  Occupational History  . Occupation: Retired  Scientific laboratory technician  . Financial resource strain: Not on file  . Food insecurity:    Worry: Not on file    Inability: Not on file  . Transportation needs:    Medical: Not on file    Non-medical: Not on file  Tobacco Use  . Smoking status: Former Smoker    Last attempt to quit: 04/25/1969    Years since quitting: 48.4  . Smokeless tobacco: Former Network engineer and Sexual Activity  . Alcohol use: Yes    Alcohol/week: 4.2 - 8.4 oz    Types: 7 - 14 Glasses of wine per week    Comment: socially shots of wiskey  . Drug use: No  . Sexual activity: Not on file  Lifestyle  . Physical activity:    Days per  week: Not on file    Minutes per session: Not on file  . Stress: Not on file  Relationships  . Social connections:    Talks on phone: Not on file    Gets together: Not on file    Attends religious service: Not on file    Active member of club or organization: Not on file    Attends meetings of clubs or organizations: Not on file    Relationship status: Not on file  . Intimate partner violence:    Fear  of current or ex partner: Not on file    Emotionally abused: Not on file    Physically abused: Not on file    Forced sexual activity: Not on file  Other Topics Concern  . Not on file  Social History Narrative   ** Merged History Encounter **   Lives at home w/ his wife   Right-handed   Caffeine: occasional Pepsi      Patient is currently retired.   FAMILY HISTORY: Family History  Problem Relation Age of Onset  . Diabetes Father   . Heart disease Father   . Heart attack Father 50  . Hypertension Father   . Cancer Sister   . Hearing loss Mother   . Eczema Sister   . Hyperlipidemia Unknown   . Diabetes Unknown   . Hypertension Unknown   . Seizures Unknown   . Thyroid disease Unknown   . Colon cancer Neg Hx   . Stroke Neg Hx     ALLERGIES:  is allergic to ramipril.  MEDICATIONS:  Current Outpatient Medications  Medication Sig Dispense Refill  . acyclovir (ZOVIRAX) 800 MG tablet Take 800 mg by mouth 2 (two) times daily.    [name] Kitchen atorvastatin (LIPITOR) 40 MG tablet Take 40 mg by mouth at bedtime.     [name] Kitchen ezetimibe (ZETIA) 10 MG tablet Take 1 tablet by mouth daily.    . folic acid (FOLVITE) 1 MG tablet Take 1 tablet by mouth daily.    . furosemide (LASIX) 40 MG tablet Take 40 mg by mouth 2 (two) times daily.    . isosorbide mononitrate (IMDUR) 30 MG 24 hr tablet Take 15 mg by mouth daily.     [name] Kitchen losartan (COZAAR) 25 MG tablet Take 25 mg by mouth daily.    . Multiple Vitamin (MULTI-VITAMINS) TABS Take 1 tablet by mouth daily.    [name] Kitchen omeprazole (PRILOSEC) 20 MG capsule Take 20  mg by mouth daily.      [name] Kitchen oxyCODONE (OXY IR/ROXICODONE) 5 MG immediate release tablet Take 1 tablet by mouth every 6 (six) hours as needed for severe pain.    . potassium chloride SA (K-DUR,KLOR-CON) 20 MEQ tablet Take 20 mEq by mouth at bedtime.     . sulfamethoxazole-trimethoprim (BACTRIM DS,SEPTRA DS) 800-160 MG tablet Take 1 tablet by mouth every Monday, Wednesday, and Friday.    . triamcinolone cream (KENALOG) 0.1 % Apply 1 application topically as needed for rash.    . nitroGLYCERIN (NITROSTAT) 0.4 MG SL tablet Place 1 tablet (0.4 mg total) under the tongue every 5 (five) minutes as needed for chest pain. (Patient not taking: Reported on 09/06/2017) 25 tablet 6  . ondansetron (ZOFRAN) 8 MG tablet Take 1 tablet (8 mg total) by mouth 2 (two) times daily as needed for refractory nausea / vomiting. Start on D 4 and 11. Do not take on D8. (Patient not taking: Reported on 09/06/2017) 30 tablet 1  . prochlorperazine (COMPAZINE) 10 MG tablet Take 1 tablet (10 mg total) by mouth every 6 (six) hours as needed (Nausea or vomiting). (Patient not taking: Reported on 09/06/2017) 30 tablet 1   No current facility-administered medications for this visit.     REVIEW OF SYSTEMS:    A 10+ POINT REVIEW OF SYSTEMS WAS OBTAINED including neurology, dermatology, psychiatry, cardiac, respiratory, lymph, extremities, GI, GU, Musculoskeletal, constitutional, breasts, reproductive, HEENT.  All pertinent positives are noted in the HPI.  All others are negative.   PHYSICAL EXAMINATION: ECOG PERFORMANCE STATUS: 1 - Symptomatic  but completely ambulatory  . Vitals:   10/04/17 1448  BP: 93/62  Pulse: 71  Resp: 18  Temp: 97.9 F (36.6 C)  SpO2: 99%   Filed Weights   10/04/17 1448  Weight: 197 lb 12.8 oz (89.7 kg)   .Body mass index is 27.59 kg/m.  GENERAL:alert, in no acute distress and comfortable SKIN: no acute rashes, no significant lesions EYES: conjunctiva are pink and non-injected, sclera  anicteric OROPHARYNX: MMM, no exudates, no oropharyngeal erythema or ulceration NECK: supple, no JVD LYMPH:  no palpable lymphadenopathy in the cervical, axillary or inguinal regions LUNGS: clear to auscultation b/l with normal respiratory effort HEART: regular rate & rhythm ABDOMEN:  normoactive bowel sounds , non tender, not distended. Extremity: no pedal edema PSYCH: alert & oriented x 3 with fluent speech NEURO: no focal motor/sensory deficits   LABORATORY DATA:  I have reviewed the data as listed  . CBC Latest Ref Rng & Units 10/04/2017 09/20/2017 09/06/2017  WBC 4.0 - 10.3 K/uL 6.2 7.6 6.3  Hemoglobin 13.0 - 17.1 g/dL 12.8(L) 12.4(L) 12.4(L)  Hematocrit 38.4 - 49.9 % 38.8 37.0(L) 37.5(L)  Platelets 140 - 400 K/uL 132(L) 114(L) 108(L)    CMP Latest Ref Rng & Units 10/04/2017 09/20/2017 09/06/2017  Glucose 70 - 140 mg/dL 104 91 121  BUN 7 - 26 mg/dL 32(H) 30(H) 31(H)  Creatinine 0.70 - 1.30 mg/dL 1.81(H) 1.73(H) 1.88(H)  Sodium 136 - 145 mmol/L 141 142 142  Potassium 3.5 - 5.1 mmol/L 4.1 3.7 4.2  Chloride 98 - 109 mmol/L 106 106 105  CO2 22 - 29 mmol/L 26 25 30(H)  Calcium 8.4 - 10.4 mg/dL 8.9 8.9 8.9  Total Protein 6.4 - 8.3 g/dL 6.4 6.4 6.6  Total Bilirubin 0.2 - 1.2 mg/dL 0.6 0.6 0.7  Alkaline Phos 40 - 150 U/L 68 69 81  AST 5 - 34 U/L 25 27 32  ALT 0 - 55 U/L _0 RADIOGRAPHIC STUDIES: I have personally reviewed the radiological images as listed and agreed with the findings in the report. No results found. Result status: Edited Result - FINAL                              *[facility] [employer].                        [place], [place] [address]                            [phone]  ------------------------------------------------------------------- Transthoracic Echocardiography  (Report amended )  Patient:    Alex [name], Alex [name] Alex #:       [id number] Study Date:  08/24/2016 Gender:     M Age:        2 Height:     180.3 cm Weight:     90.6 kg BSA:        2.15 m^2 Pt. Status: Room:   ATTENDING    Darlina Guys, MD  Willia Craze, [facility]    [name]  PERFORMING   [facility], Outpatient  SONOGRAPHER  Roseanna Rainbow  cc:  ------------------------------------------------------------------- LV EF: 65% -   70%  ------------------------------------------------------------------- Indications:      Aortic stenosis 424.1.  ------------------------------------------------------------------- History:   PMH:  Amyloidosis.  Coronary artery disease.  Angina pectoris.  Risk factors:  Hypertension. Dyslipidemia.  ------------------------------------------------------------------- Study Conclusions  - Left ventricle: The cavity size was normal. Wall thickness was   increased in a pattern of mild LVH. Systolic function was   vigorous. The estimated ejection fraction was in the range of 65%   to 70%. Wall motion was normal; there were no regional wall   motion abnormalities. Doppler parameters are consistent with   abnormal left ventricular relaxation (grade 1 diastolic   dysfunction). - Aortic valve: Valve mobility was mildly restricted. - Aortic root: The aortic root was mildly dilated. - Mitral valve: Calcified annulus.  Impressions:  - Vigorous LV systolic function; mild diastolic dysfunction; mild   LVH; calcified aortic valve with no significant AS (peak velocity   2 m/s; mean gradient 9 mmHg); mildly dilated aortic root.    ASSESSMENT & PLAN:   77 y.o.  male with above-mentioned multiple medical comorbidities with  #1 Biopsy proven Renal and Systemic AL Amyloidosis Initial BM Bx shows 9% plasma cells and amyloid deposits. Echo shows normal systolic function with only grade 1 diastolic dysfunction. Skeletal survey with no lytic lesions suggestive of multiple myeloma. Serum K/L ratio increased at  about 6.22 on diagnosis and has now improved to 1.5 (wnl with normal kappa lambda serum free light chain ratio ) UPEP shows 24h protein improved from 15g to 5g daily and the last recent one is about 7.3 g /24h  #2  Nephrotic Syndrome - likely related to AL amyloidosis.  UPEP shows 24h protein improved from 15g to 5g daily and the last recent one is about 7.3 g /24h   Urine Study 07/26/17 showed Protein at 2.232g.  -07/19/17 the pt had a BM Bx exam which revealed Normocellular bone marrow (30-40%) with 2-3% plasma cells and focal amyloid deposition. Plan -continuing onVelcade maintenance as per recommendations from his transplant team and [place] with scheduled vaccinations with Dr. Norma Fredrickson -Will continue to watch Grade 1 neuropathy  -Discussed pt labwork today, 10/04/17; blood counts and chemistries are stable.  -Encouraged pt to call his transplant team to confirm that he is good to stop Bactrim and Folic acid, and reducing Acyclovir to 457m twice a day -The pt has no prohibitive toxicities from continuing maintenance Velcade at this time.  -Discussed that if transplant team is okay with switch to Ninlaro from Velcade, that would be acceptable from my end  -Will see pt back in 8 weeks with labs  #4 Patient Active Problem List   Diagnosis Date Noted  . Folliculitis 032/44/0102 . [place] amyloidosis ([place] 08/21/2016  . Umbilical hernia 072/53/6644 . DIZZINESS 02/19/2010  . CAD, NATIVE VESSEL 07/21/2009  . CHEST PAIN-UNSPECIFIED 07/21/2009  . HYPERLIPIDEMIA 04/30/2007  . HYPERTENSION 04/30/2007  . GASTROESOPHAGEAL REFLUX DISEASE 04/30/2007  . SEIZURE DISORDER 04/30/2007  . COMPRESSION FRACTURE, L1 VERTEBRA 04/30/2007  . DIVERTICULOSIS, COLON, HX OF 04/30/2007  . Other postprocedural status(V45.89) 04/30/2007  -Continue follow-up with primary care physician for management of other medical co-morbidities. -Recommend if pt experiences spike fever to report to hospital with  transplant team    -continue maintenance Velcade q2weeks --please schedule next 6 doses -RTC with Dr KIrene Limboin 8 weeks with labs   All of the  patients and his wife's questions were answered to their apparent satisfaction. They are agreeable with the plan as noted above .The patient knows to call the clinic with any problems, questions or concerns.  The toal time spent in the appt was 25 minutes and more than 50% was on counseling and direct patient cares.   Sullivan Lone MD [place] AAHIVMS [facility] Hematology/Oncology Physician [facility]  (Office):       [phone] (Work cell):  [phone] (Fax):           [phone]  I, Baldwin [place], am acting as a scribe for Dr [name].   .I have reviewed the above documentation for accuracy and completeness, and I agree with the above. Brunetta Genera MD

## 2017-10-04 ENCOUNTER — Inpatient Hospital Stay: Payer: Medicare HMO | Attending: Hematology | Admitting: Hematology

## 2017-10-04 ENCOUNTER — Inpatient Hospital Stay: Payer: Medicare HMO

## 2017-10-04 ENCOUNTER — Encounter: Payer: Self-pay | Admitting: Hematology

## 2017-10-04 VITALS — BP 93/62 | HR 71 | Temp 97.9°F | Resp 18 | Ht 71.0 in | Wt 197.8 lb

## 2017-10-04 DIAGNOSIS — E8581 Light chain (AL) amyloidosis: Secondary | ICD-10-CM

## 2017-10-04 DIAGNOSIS — E854 Organ-limited amyloidosis: Secondary | ICD-10-CM

## 2017-10-04 DIAGNOSIS — N08 Glomerular disorders in diseases classified elsewhere: Secondary | ICD-10-CM | POA: Diagnosis not present

## 2017-10-04 DIAGNOSIS — G629 Polyneuropathy, unspecified: Secondary | ICD-10-CM | POA: Diagnosis not present

## 2017-10-04 DIAGNOSIS — Z5112 Encounter for antineoplastic immunotherapy: Secondary | ICD-10-CM | POA: Insufficient documentation

## 2017-10-04 LAB — CMP (CANCER CENTER ONLY)
ALBUMIN: 3.7 g/dL (ref 3.5–5.0)
ALT: 19 U/L (ref 0–55)
AST: 25 U/L (ref 5–34)
Alkaline Phosphatase: 68 U/L (ref 40–150)
Anion gap: 9 (ref 3–11)
BUN: 32 mg/dL — AB (ref 7–26)
CHLORIDE: 106 mmol/L (ref 98–109)
CO2: 26 mmol/L (ref 22–29)
Calcium: 8.9 mg/dL (ref 8.4–10.4)
Creatinine: 1.81 mg/dL — ABNORMAL HIGH (ref 0.70–1.30)
GFR, EST NON AFRICAN AMERICAN: 35 mL/min — AB (ref >60)
GFR, Est AFR Am: 41 mL/min — ABNORMAL LOW (ref >60)
Glucose, Bld: 104 mg/dL (ref 70–140)
POTASSIUM: 4.1 mmol/L (ref 3.5–5.1)
SODIUM: 141 mmol/L (ref 136–145)
Total Bilirubin: 0.6 mg/dL (ref 0.2–1.2)
Total Protein: 6.4 g/dL (ref 6.4–8.3)

## 2017-10-04 LAB — CBC WITH DIFFERENTIAL/PLATELET
BASOS ABS: 0 10*3/uL (ref 0.0–0.1)
Basophils Relative: 1 %
Eosinophils Absolute: 0.2 10*3/uL (ref 0.0–0.5)
Eosinophils Relative: 2 %
HCT: 38.8 % (ref 38.4–49.9)
HEMOGLOBIN: 12.8 g/dL — AB (ref 13.0–17.1)
LYMPHS ABS: 1.2 10*3/uL (ref 0.9–3.3)
Lymphocytes Relative: 20 %
MCH: 30.4 pg (ref 27.2–33.4)
MCHC: 33 g/dL (ref 32.0–36.0)
MCV: 92.1 fL (ref 79.3–98.0)
Monocytes Absolute: 0.6 10*3/uL (ref 0.1–0.9)
Monocytes Relative: 9 %
NEUTROS PCT: 68 %
Neutro Abs: 4.2 10*3/uL (ref 1.5–6.5)
Platelets: 132 10*3/uL — ABNORMAL LOW (ref 140–400)
RBC: 4.21 MIL/uL (ref 4.20–5.82)
RDW: 15.8 % — ABNORMAL HIGH (ref 11.0–14.6)
WBC: 6.2 10*3/uL (ref 4.0–10.3)

## 2017-10-04 MED ORDER — ONDANSETRON HCL 8 MG PO TABS: 8.0000 mg | ORAL_TABLET | Freq: Once | ORAL | Status: DC

## 2017-10-04 MED ORDER — BORTEZOMIB CHEMO SQ INJECTION 3.5 MG (2.5MG/ML)
1.3000 mg/m2 | Freq: Once | INTRAMUSCULAR | Status: AC
  Administered 2017-10-04: 2.75 mg via SUBCUTANEOUS
  Filled 2017-10-04: qty 1.1

## 2017-10-04 NOTE — Patient Instructions (Signed)
[facility] Discharge Instructions for Patients Receiving Chemotherapy  Today you received the following chemotherapy agents bortezomib (Velcade)  To help prevent nausea and vomiting after your treatment, we encourage you to take your nausea medication as directed.   If you develop nausea and vomiting that is not controlled by your nausea medication, call the clinic.   BELOW ARE SYMPTOMS THAT SHOULD BE REPORTED IMMEDIATELY:  *FEVER GREATER THAN 100.5 F  *CHILLS WITH OR WITHOUT FEVER  NAUSEA AND VOMITING THAT IS NOT CONTROLLED WITH YOUR NAUSEA MEDICATION  *UNUSUAL SHORTNESS OF BREATH  *UNUSUAL BRUISING OR BLEEDING  TENDERNESS IN MOUTH AND THROAT WITH OR WITHOUT PRESENCE OF ULCERS  *URINARY PROBLEMS  *BOWEL PROBLEMS  UNUSUAL RASH Items with * indicate a potential emergency and should be followed up as soon as possible.  Feel free to call the clinic should you have any questions or concerns. The clinic phone number is [phone].  Please show the CHEMO ALERT CARD at check-in to the Emergency Department and triage nurse.   

## 2017-10-04 NOTE — Progress Notes (Signed)
Dr. [name] ok to tx with ctn 1.81.

## 2017-10-05 ENCOUNTER — Telehealth: Payer: Self-pay

## 2017-10-05 LAB — KAPPA/LAMBDA LIGHT CHAINS
KAPPA FREE LGHT CHN: 16.5 mg/L (ref 3.3–19.4)
Kappa, lambda light chain ratio: 1.11 (ref 0.26–1.65)
Lambda free light chains: 14.9 mg/L (ref 5.7–26.3)

## 2017-10-05 NOTE — Telephone Encounter (Signed)
Spoke with patient concerning upcoming appointment informed him that I would mail a calender of these added dates.per 6/12 los

## 2017-10-06 LAB — MULTIPLE MYELOMA PANEL, SERUM
ALBUMIN SERPL ELPH-MCNC: 3.3 g/dL (ref 2.9–4.4)
ALPHA 1: 0.2 g/dL (ref 0.0–0.4)
ALPHA2 GLOB SERPL ELPH-MCNC: 0.9 g/dL (ref 0.4–1.0)
Albumin/Glob SerPl: 1.3 (ref 0.7–1.7)
B-GLOBULIN SERPL ELPH-MCNC: 1 g/dL (ref 0.7–1.3)
GAMMA GLOB SERPL ELPH-MCNC: 0.6 g/dL (ref 0.4–1.8)
GLOBULIN, TOTAL: 2.7 g/dL (ref 2.2–3.9)
IGG (IMMUNOGLOBIN G), SERUM: 677 mg/dL — AB (ref 700–1600)
IgA: 50 mg/dL — ABNORMAL LOW (ref 61–437)
IgM (Immunoglobulin M), Srm: 28 mg/dL (ref 15–143)
M PROTEIN SERPL ELPH-MCNC: 0.1 g/dL — AB
Total Protein ELP: 6 g/dL (ref 6.0–8.5)

## 2017-10-13 IMAGING — CT CT ANGIO CHEST
2 of 6 series · 17 of 36 positions shown · IV contrast (ISOVUE 370)
Comparison: 11/16/2016 and prior chest radiographs

CLINICAL DATA: 75-year-old male with acute shortness of breath and
hypotension. Patient with amyloidosis on chemotherapy.

EXAM:
CT ANGIOGRAPHY CHEST WITH CONTRAST
TECHNIQUE: Multidetector CT imaging of the chest was performed using the
standard protocol during bolus administration of intravenous
contrast. Multiplanar CT image reconstructions and MIPs were
obtained to evaluate the vascular anatomy.
CONTRAST:  80 cc intravenous Isovue 370

[Series 6: coronal mpr · coronal · 0.47mm/px · 1 of 151 slices shown]
[im 76/151  mediastinal]
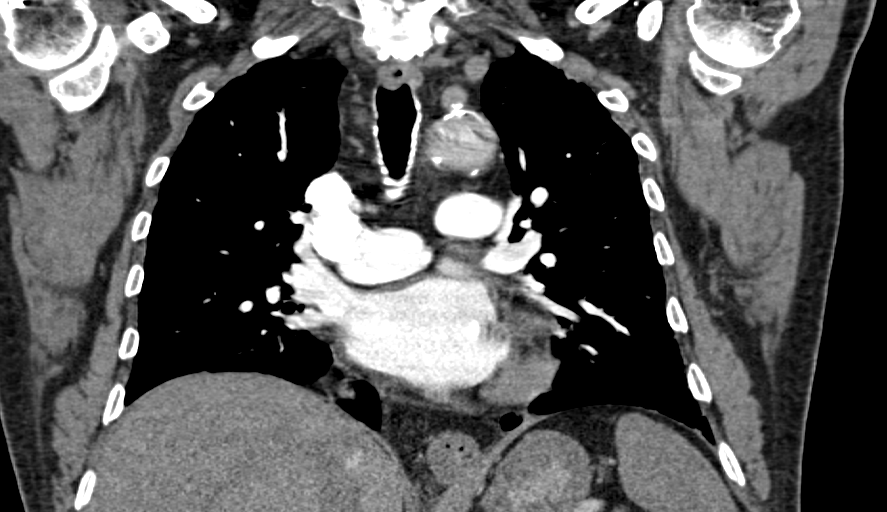

[Series 11: thins for pacs · axial · 0.81mm/px · z∈[+635,+851]mm · 16 of 240 slices shown]
[im 12/240  lung]
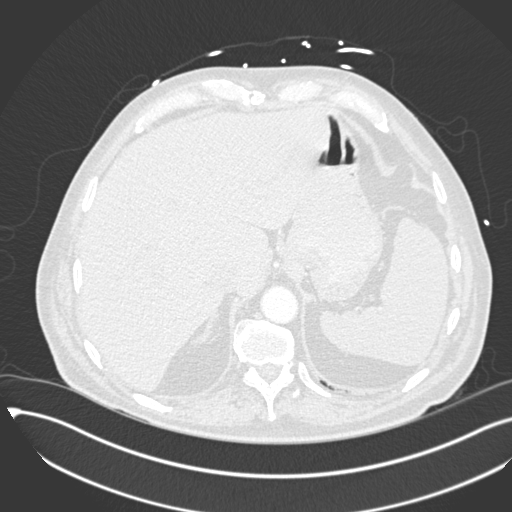
[im 24/240  mediastinal]
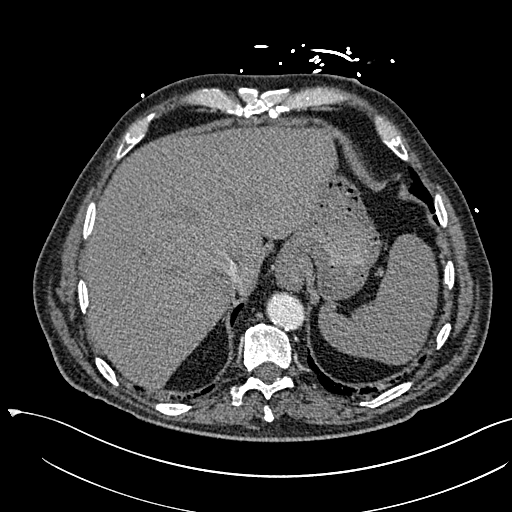
[im 36/240  lung]
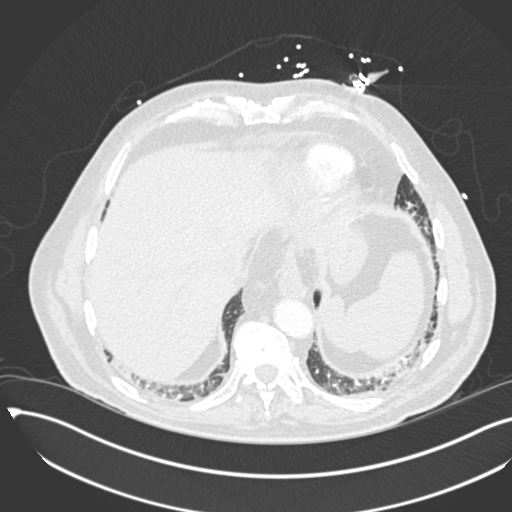
[im 60/240  mediastinal]
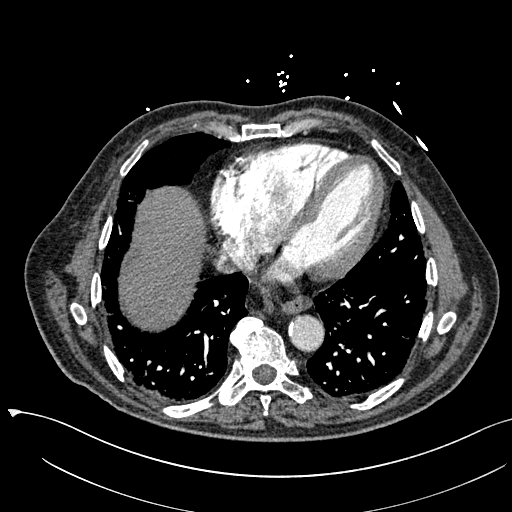
[im 72/240  lung]
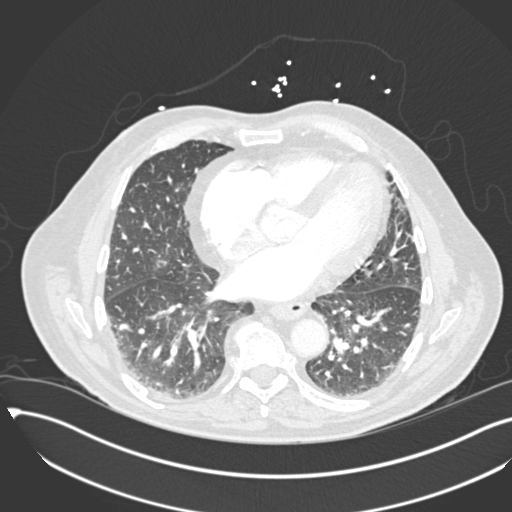
[im 84/240  mediastinal]
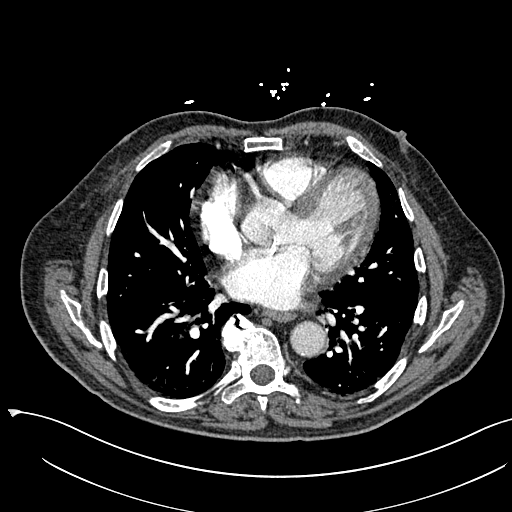
[im 96/240  lung]
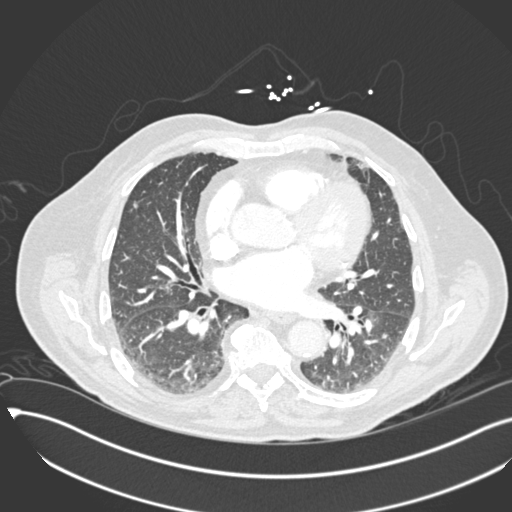
[im 108/240  mediastinal]
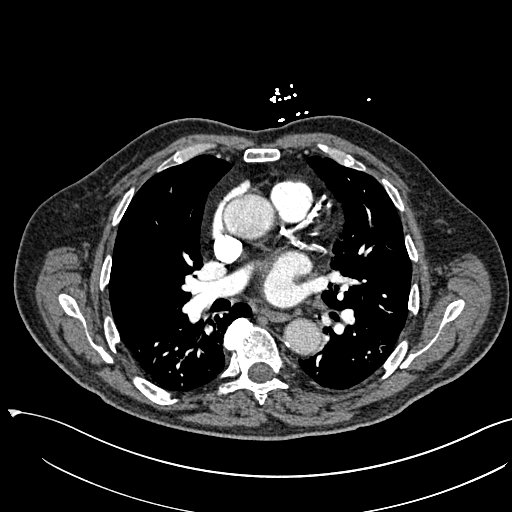
[im 132/240  lung]
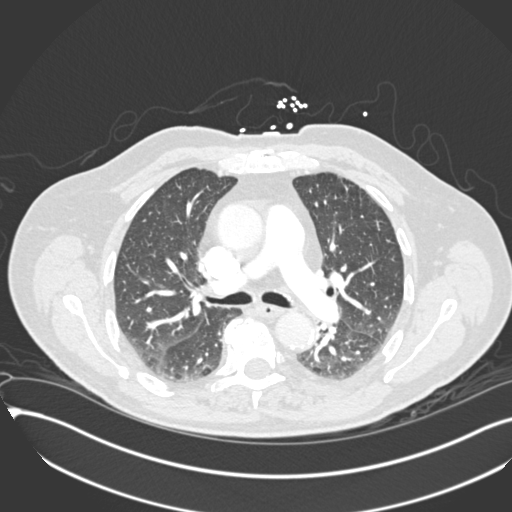
[im 144/240  mediastinal]
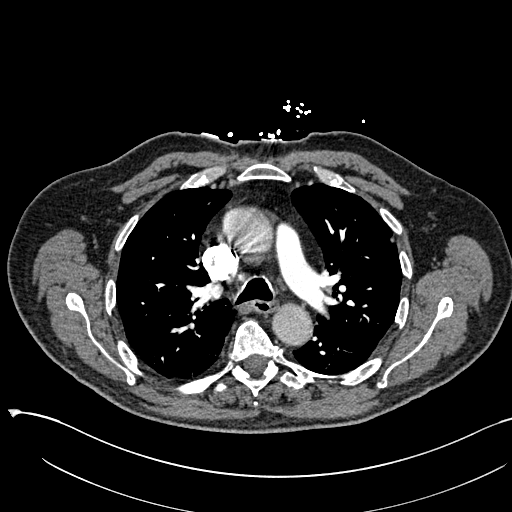
[im 156/240  lung]
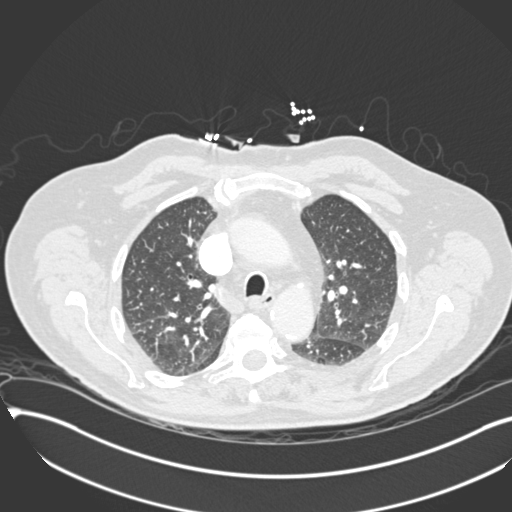
[im 168/240  mediastinal]
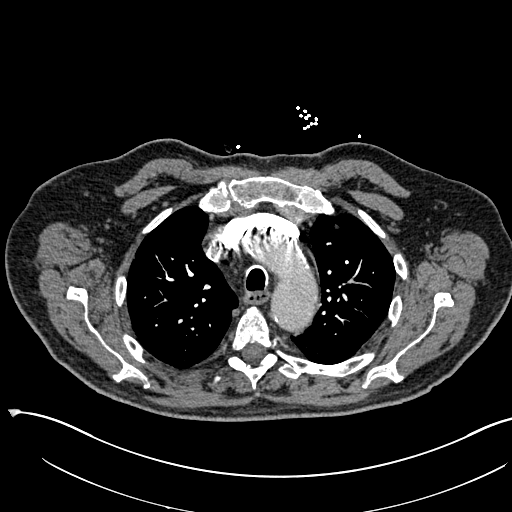
[im 180/240  lung]
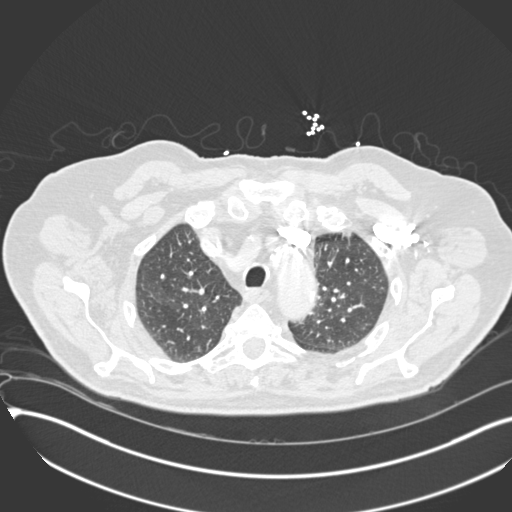
[im 204/240  mediastinal]
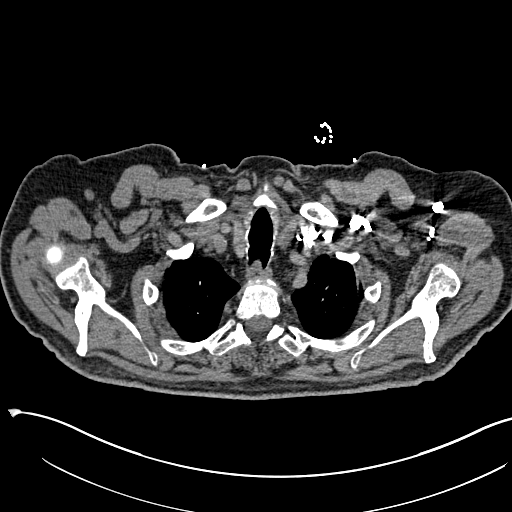
[im 216/240  lung]
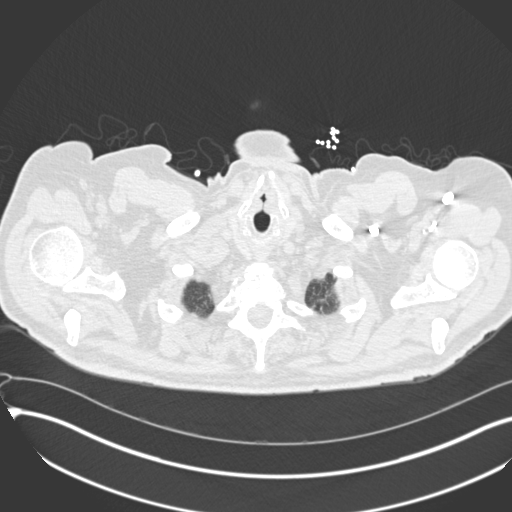
[im 228/240  mediastinal]
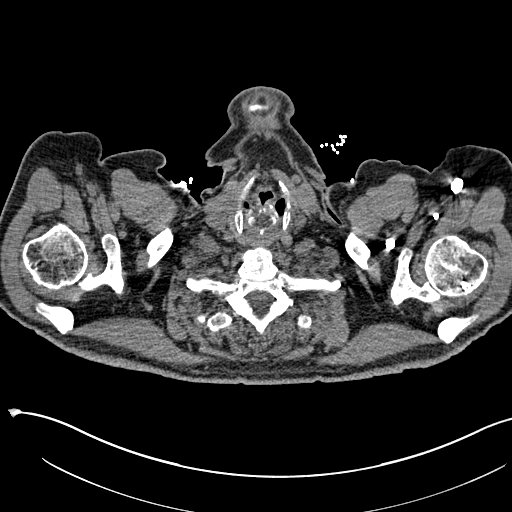

[17 of 36 positions shown; findings below may reference images not displayed]

FINDINGS: Cardiovascular: This is a technically satisfactory study. No
pulmonary emboli are identified. Cardiomegaly and heavy coronary
artery calcifications noted. Ectasia of the ascending and transverse
thoracic aorta noted, measuring up to 3.6 cm in greatest diameter.
No pericardial effusion.

Mediastinum/Nodes: No enlarged mediastinal, hilar, or axillary lymph
nodes. Thyroid gland, trachea, and esophagus demonstrate no
significant findings.

Lungs/Pleura: Peripheral streaky/interlobular opacities at the lung
bases noted and may represent atelectasis or possibly interstitial
lung disease. There are several noncalcified pulmonary nodules as
follows:

A 6 mm right middle lobe nodule (image 49)

A 6 mm peripheral left upper lobe nodule (image 33)

A 6 mm left upper lobe/lingular nodule (sees image 47)

A 5 mm lingular nodule (image 58).

No definite airspace disease identified. Mild central ground-glass
opacities are noted.

No pleural effusion or pneumothorax.

Upper Abdomen: No acute abnormality

Musculoskeletal: No chest wall abnormality. No acute or significant
osseous findings.

Review of the MIP images confirms the above findings.
IMPRESSION: No evidence of pulmonary emboli.

Cardiomegaly, coronary artery disease and ectatic thoracic aorta.

Nonspecific mild central ground-glass opacities which could
represent mild edema. Hypersensitivity, other inflammatory processes
or small airway/ small-vessel disease disease are considerations.

Bilateral noncalcified pulmonary nodules measuring 5-6 mm.
Non-contrast chest CT at 3-6 months is recommended. If the nodules
are stable at time of repeat CT, then future CT at 18-24 months
(from today's scan) is considered optional for low-risk patients,
but is recommended for high-risk patients. This recommendation
follows the consensus statement: Guidelines for Management of
Incidental Pulmonary Nodules Detected on CT Images: From the

Mild peripheral opacities within both lower lungs -interstitial lung
disease is not excluded.

Aortic Atherosclerosis ([id number]).

## 2017-10-18 ENCOUNTER — Inpatient Hospital Stay: Payer: Medicare HMO

## 2017-10-18 VITALS — BP 116/73 | HR 63 | Temp 97.8°F | Resp 16

## 2017-10-18 DIAGNOSIS — Z23 Encounter for immunization: Secondary | ICD-10-CM | POA: Diagnosis not present

## 2017-10-18 DIAGNOSIS — E8581 Light chain (AL) amyloidosis: Secondary | ICD-10-CM | POA: Diagnosis not present

## 2017-10-18 DIAGNOSIS — Z5112 Encounter for antineoplastic immunotherapy: Secondary | ICD-10-CM | POA: Diagnosis not present

## 2017-10-18 DIAGNOSIS — Z9484 Stem cells transplant status: Secondary | ICD-10-CM | POA: Diagnosis not present

## 2017-10-18 LAB — CMP (CANCER CENTER ONLY)
ALBUMIN: 3.6 g/dL (ref 3.5–5.0)
ALT: 20 U/L (ref 0–44)
ANION GAP: 9 (ref 5–15)
AST: 27 U/L (ref 15–41)
Alkaline Phosphatase: 68 U/L (ref 38–126)
BILIRUBIN TOTAL: 0.6 mg/dL (ref 0.3–1.2)
BUN: 24 mg/dL — AB (ref 8–23)
CALCIUM: 9.2 mg/dL (ref 8.9–10.3)
CO2: 24 mmol/L (ref 22–32)
CREATININE: 1.77 mg/dL — AB (ref 0.61–1.24)
Chloride: 109 mmol/L (ref 98–111)
GFR, Est AFR Am: 42 mL/min — ABNORMAL LOW (ref >60)
GFR, Estimated: 36 mL/min — ABNORMAL LOW (ref >60)
GLUCOSE: 87 mg/dL (ref 70–99)
Potassium: 4 mmol/L (ref 3.5–5.1)
SODIUM: 142 mmol/L (ref 135–145)
TOTAL PROTEIN: 6.3 g/dL — AB (ref 6.5–8.1)

## 2017-10-18 LAB — CBC WITH DIFFERENTIAL/PLATELET
BASOS ABS: 0 10*3/uL (ref 0.0–0.1)
BASOS PCT: 1 %
EOS ABS: 0.1 10*3/uL (ref 0.0–0.5)
EOS PCT: 3 %
HEMATOCRIT: 37.7 % — AB (ref 38.4–49.9)
Hemoglobin: 12.6 g/dL — ABNORMAL LOW (ref 13.0–17.1)
Lymphocytes Relative: 15 %
Lymphs Abs: 0.8 10*3/uL — ABNORMAL LOW (ref 0.9–3.3)
MCH: 30.8 pg (ref 27.2–33.4)
MCHC: 33.5 g/dL (ref 32.0–36.0)
MCV: 92 fL (ref 79.3–98.0)
MONO ABS: 0.5 10*3/uL (ref 0.1–0.9)
MONOS PCT: 9 %
NEUTROS ABS: 4 10*3/uL (ref 1.5–6.5)
Neutrophils Relative %: 72 %
Platelets: 113 10*3/uL — ABNORMAL LOW (ref 140–400)
RBC: 4.1 MIL/uL — ABNORMAL LOW (ref 4.20–5.82)
RDW: 16 % — AB (ref 11.0–14.6)
WBC: 5.4 10*3/uL (ref 4.0–10.3)

## 2017-10-18 MED ORDER — BORTEZOMIB CHEMO SQ INJECTION 3.5 MG (2.5MG/ML)
1.3000 mg/m2 | Freq: Once | INTRAMUSCULAR | Status: AC
  Administered 2017-10-18: 2.75 mg via SUBCUTANEOUS
  Filled 2017-10-18: qty 1.1

## 2017-10-18 MED ORDER — ONDANSETRON HCL 8 MG PO TABS
8.0000 mg | ORAL_TABLET | Freq: Once | ORAL | Status: AC
  Administered 2017-10-18: 8 mg via ORAL

## 2017-10-18 MED ORDER — ONDANSETRON HCL 8 MG PO TABS
ORAL_TABLET | ORAL | Status: AC
  Filled 2017-10-18: qty 1

## 2017-10-18 NOTE — Progress Notes (Signed)
Per Dr. [name] ok to treat with Creatine of 1.77.

## 2017-10-18 NOTE — Patient Instructions (Signed)
[facility] Discharge Instructions for Patients Receiving Chemotherapy  Today you received the following chemotherapy agents bortezomib (Velcade)  To help prevent nausea and vomiting after your treatment, we encourage you to take your nausea medication as directed.   If you develop nausea and vomiting that is not controlled by your nausea medication, call the clinic.   BELOW ARE SYMPTOMS THAT SHOULD BE REPORTED IMMEDIATELY:  *FEVER GREATER THAN 100.5 F  *CHILLS WITH OR WITHOUT FEVER  NAUSEA AND VOMITING THAT IS NOT CONTROLLED WITH YOUR NAUSEA MEDICATION  *UNUSUAL SHORTNESS OF BREATH  *UNUSUAL BRUISING OR BLEEDING  TENDERNESS IN MOUTH AND THROAT WITH OR WITHOUT PRESENCE OF ULCERS  *URINARY PROBLEMS  *BOWEL PROBLEMS  UNUSUAL RASH Items with * indicate a potential emergency and should be followed up as soon as possible.  Feel free to call the clinic should you have any questions or concerns. The clinic phone number is [phone].  Please show the CHEMO ALERT CARD at check-in to the Emergency Department and triage nurse.   

## 2017-10-19 LAB — KAPPA/LAMBDA LIGHT CHAINS
KAPPA FREE LGHT CHN: 14.9 mg/L (ref 3.3–19.4)
KAPPA, LAMDA LIGHT CHAIN RATIO: 1.25 (ref 0.26–1.65)
Lambda free light chains: 11.9 mg/L (ref 5.7–26.3)

## 2017-10-20 LAB — MULTIPLE MYELOMA PANEL, SERUM
ALBUMIN SERPL ELPH-MCNC: 3.6 g/dL (ref 2.9–4.4)
Albumin/Glob SerPl: 1.7 (ref 0.7–1.7)
Alpha 1: 0.2 g/dL (ref 0.0–0.4)
Alpha2 Glob SerPl Elph-Mcnc: 0.8 g/dL (ref 0.4–1.0)
B-Globulin SerPl Elph-Mcnc: 0.8 g/dL (ref 0.7–1.3)
Gamma Glob SerPl Elph-Mcnc: 0.4 g/dL (ref 0.4–1.8)
Globulin, Total: 2.2 g/dL (ref 2.2–3.9)
IGA: 50 mg/dL — AB (ref 61–437)
IGM (IMMUNOGLOBULIN M), SRM: 19 mg/dL (ref 15–143)
IgG (Immunoglobin G), Serum: 640 mg/dL — ABNORMAL LOW (ref 700–1600)
M PROTEIN SERPL ELPH-MCNC: 0.1 g/dL — AB
Total Protein ELP: 5.8 g/dL — ABNORMAL LOW (ref 6.0–8.5)

## 2017-10-24 ENCOUNTER — Telehealth: Payer: Self-pay

## 2017-10-24 NOTE — Telephone Encounter (Signed)
Received a call from patient's wife stating the patient started Ninlaro last night around 11pm. Patient has vomited about 4 times since starting medication. Patient's wife thought that someone at the [facility] called to tell patient to start taking Ninlaro but then figured out that someone from [facility] called and instructed patient to start Ninlaro. Dr. [name] instructed patient to take antiemetics and drink plenty of fluids. Patient's wife stated she is going to call [facility] and let them know what is going on with the patient. Notified to call the office with any further questions or concerns. Patient's wife verbalized understanding.

## 2017-10-24 NOTE — Telephone Encounter (Signed)
Spoke to patient's wife and she stated she spoke to a physician's assistant at [facility] and the patient's medication is going to be adjusted and the PA gave instructions to take Zofran prior to taking med. Patient's wife instructed to call the office with any further questions or concerns.

## 2017-10-27 ENCOUNTER — Telehealth: Payer: Self-pay | Admitting: Pharmacist

## 2017-10-27 DIAGNOSIS — E8581 Light chain (AL) amyloidosis: Secondary | ICD-10-CM

## 2017-10-27 MED ORDER — IXAZOMIB CITRATE 3 MG PO CAPS: ORAL_CAPSULE | ORAL | 2 refills | Status: DC

## 2017-10-27 NOTE — Telephone Encounter (Signed)
Oral Oncology Pharmacist Encounter  Received notification from patient and [facility] that patient will now be starting on Ninlaro (ixazomib) for the maintenance treatment of AL amyloidosis. Patient is status post autologous stem cell transplant on 04/11/2017.  Patient had been on previous treatment with bortezomib pre--and post-transplant.  Patient did have prescription for [facility] written in August 2018. Patient did not ever start on Ninlaro at that time.  Patient now has been directed by the transplant office at [facility] to start on [facility] for maintenance therapy. This direction occurred without the knowledge of Dr. Grier Mitts office.  Dr. Norma Fredrickson sent prescription for [facility] to the [facility] long outpatient pharmacy, probably at the direction of patient. Prescription sent by Dr. Norma Fredrickson on 10/24/2017 was for Ninlaro 3 mg capsules.  Patient's home supply was Ninlaro 4 mg capsules. Patient did not get the 3 mg capsule medication as directed, he started on his home supply instead, as he did not recall this medication was now ordered at a different dose.  Noted patient with some nausea and vomiting after restarting his Ninlaro. Patient has followed up with the [facility] office for proper management of patient's nausea.  Dr. [name] will direct the [facility] restart and its dispensing from the [place].  Labs from 10/18/17 assessed Noted SCr = 1.77- has been elevated since May 2019, est CrCl ~ 45 mL/min (possibly over-estimated d/t wt=89.7kg)  eGFR est at 36 L/min Noted pltc = 113k, will continue to be monitored  Noted dose reduction to 47m weekly per Rx from dr. RGinnie Smartand at the direction of Dr. KIrene Limbo Current medication list in Epic reviewed, no DDIs with Ninlaro identified.  [facility].  Oral Oncology Clinic will continue to follow for insurance  authorization, copayment issues, initial counseling and start date.  JJohny Drilling PharmD, BCPS, BCOP  10/27/2017 10:32 AM Oral Oncology Clinic [phone]

## 2017-10-27 NOTE — Telephone Encounter (Signed)
Oral Oncology Pharmacist Encounter  Received notification from the [place] that [facility] prescription requires prior insurance authorization.  PA submitted on covermymeds Key: [id number] Supporting documentation for Ninlaro use for AL Amyloidosis from [facility] submitted with PA request. Status is pending.  This encounter will continue to be updated until final determination.  Johny Drilling, PharmD, BCPS, BCOP  10/27/2017 11:09 AM Oral Oncology Clinic [phone]

## 2017-10-27 NOTE — Telephone Encounter (Signed)
Oral Chemotherapy Pharmacist Encounter   I spoke with patient for overview of: Ninlaro. He was directed to start Ninlaro on 10/23/17 by [facility] MD, [place] just now getting involved. He started home supply of 4mg  acquired in 2018.  Counseled patient on administration, dosing, side effects, monitoring, drug-food interactions, safe handling, storage, and disposal.  Patient will take Ninlaro 3mg  capsules, 1 capsule by mouth once weekly. Take on an empty stomach, 1 hour before or 2 hours after a meal. Take on days 1, 8, and 15 of each 28 day cycle.  Ninlaro start date: 4mg  on 10/23/17  Adverse effects of Ninlaro include but are not limited to: peripheral edema, constipation, nausea, vomiting, decreased blood counts, and peripheral neuropathy.   Patient has anti-emetic on hand and knows to take it if nausea develops.    Patient experienced nausea and vomiting after 1st Ninlaro dose. He will take ondansetron 30-60 min prior to next Ninlaro dose to prevent N/V.  Patient states he has a rash down his neck, chest, and back that has occurred since Ninlaro start. Patient states is it getting progressivley worse, there are whelps. He has been using Kenolog cream, without relief. Patient instructed to take diphenhydramine for relief. Pt instructed to try famotidine or ranitidine if rash and whelps not relieved by diphenhydramine.  Patient instructed to contact office on 10/30/17 if rash continues to progress. If is it better, we will see him in the office at 11/01/17 appointment that is already scheduled.  Patient instructed to NOT re-start Ninlaro until directed to do so by Dr. [name] at 7/10 appointment. I will arrange for his Ninlaro to be ready for pick-up at the [facility] on that date pending insurance authorization and affordability.  Reviewed with patient importance of keeping a medication schedule and plan for any missed doses.  Mr. Ernst Breach voiced understanding and appreciation.    All questions answered. Medication reconciliation performed and medication/allergy list updated.  Will follow up with patient regarding insurance and pharmacy.   Patient knows to call the office with questions or concerns. Oral Oncology Clinic will continue to follow.  Thank you,  Johny Drilling, PharmD, BCPS, BCOP  10/27/2017 12:19 PM Oral Oncology Clinic [phone]

## 2017-10-27 NOTE — Telephone Encounter (Signed)
Oral Oncology Pharmacist Encounter  Test claim at the pharmacy revealed copayment $0 It was billed against patient's insurance and existing copayment foundation grant for diagnosis.  The pharmacy will have to order the Ninlaro 3 mg capsules They will be made ready at the pharmacy after 2 PM on Monday, 10/30/2017.  Patient instructed to wait until after office visit on 11/01/2017 and evaluation of rash prior to picking up his Ninlaro for restart.  Johny Drilling, PharmD, BCPS, BCOP  10/27/2017 1:40 PM Oral Oncology Clinic [phone]

## 2017-10-27 NOTE — Telephone Encounter (Signed)
Oral Oncology Pharmacist Encounter  Received notification of insurance authorization for Ninlaro from [place] Ref# [id number] Effective dates: 04/23/17-04/24/18  Johny Drilling, PharmD, BCPS, BCOP  10/27/2017 12:32 PM Oral Oncology Clinic [phone]

## 2017-10-30 ENCOUNTER — Telehealth: Payer: Self-pay

## 2017-10-30 ENCOUNTER — Telehealth: Payer: Self-pay | Admitting: Hematology

## 2017-10-30 ENCOUNTER — Inpatient Hospital Stay: Payer: Medicare HMO | Attending: Hematology | Admitting: Medical

## 2017-10-30 VITALS — BP 110/66 | HR 78 | Temp 98.4°F | Resp 18 | Ht 71.0 in | Wt 199.3 lb

## 2017-10-30 DIAGNOSIS — G4733 Obstructive sleep apnea (adult) (pediatric): Secondary | ICD-10-CM | POA: Diagnosis not present

## 2017-10-30 DIAGNOSIS — K219 Gastro-esophageal reflux disease without esophagitis: Secondary | ICD-10-CM | POA: Insufficient documentation

## 2017-10-30 DIAGNOSIS — L509 Urticaria, unspecified: Secondary | ICD-10-CM

## 2017-10-30 DIAGNOSIS — Z8614 Personal history of Methicillin resistant Staphylococcus aureus infection: Secondary | ICD-10-CM | POA: Insufficient documentation

## 2017-10-30 DIAGNOSIS — Z87891 Personal history of nicotine dependence: Secondary | ICD-10-CM | POA: Diagnosis not present

## 2017-10-30 DIAGNOSIS — I350 Nonrheumatic aortic (valve) stenosis: Secondary | ICD-10-CM | POA: Insufficient documentation

## 2017-10-30 DIAGNOSIS — N049 Nephrotic syndrome with unspecified morphologic changes: Secondary | ICD-10-CM | POA: Diagnosis not present

## 2017-10-30 DIAGNOSIS — I2510 Atherosclerotic heart disease of native coronary artery without angina pectoris: Secondary | ICD-10-CM | POA: Diagnosis not present

## 2017-10-30 DIAGNOSIS — Z79899 Other long term (current) drug therapy: Secondary | ICD-10-CM | POA: Insufficient documentation

## 2017-10-30 DIAGNOSIS — E8581 Light chain (AL) amyloidosis: Secondary | ICD-10-CM | POA: Diagnosis not present

## 2017-10-30 DIAGNOSIS — K5730 Diverticulosis of large intestine without perforation or abscess without bleeding: Secondary | ICD-10-CM | POA: Diagnosis not present

## 2017-10-30 DIAGNOSIS — G629 Polyneuropathy, unspecified: Secondary | ICD-10-CM | POA: Insufficient documentation

## 2017-10-30 DIAGNOSIS — E785 Hyperlipidemia, unspecified: Secondary | ICD-10-CM | POA: Diagnosis not present

## 2017-10-30 DIAGNOSIS — I10 Essential (primary) hypertension: Secondary | ICD-10-CM | POA: Insufficient documentation

## 2017-10-30 DIAGNOSIS — K449 Diaphragmatic hernia without obstruction or gangrene: Secondary | ICD-10-CM | POA: Diagnosis not present

## 2017-10-30 MED ORDER — FAMOTIDINE 20 MG PO TABS: 40.0000 mg | ORAL_TABLET | Freq: Every day | ORAL | 1 refills | Status: DC

## 2017-10-30 MED ORDER — HYDROXYZINE HCL 25 MG PO TABS: 25.0000 mg | ORAL_TABLET | Freq: Three times a day (TID) | ORAL | 1 refills | Status: DC | PRN

## 2017-10-30 MED ORDER — CETIRIZINE HCL 10 MG PO TABS: 10.0000 mg | ORAL_TABLET | Freq: Every day | ORAL | 1 refills | Status: DC

## 2017-10-30 MED ORDER — PREDNISONE 10 MG PO TABS: ORAL_TABLET | ORAL | 0 refills | Status: DC

## 2017-10-30 NOTE — Patient Instructions (Signed)

## 2017-10-30 NOTE — Progress Notes (Signed)
Pt seen by PA Lucianne Lei for rash only.  No RN assessment performed.  PA aware.

## 2017-10-30 NOTE — Telephone Encounter (Signed)
Appointments scheduled AVS/Calendar printed per 7/8 los °

## 2017-10-30 NOTE — Telephone Encounter (Signed)
I called and spoke with patient about his rash.  I advised him to come into our Symptom Management Clinic today to have that assessed.  A high priority message was sent and the patient will be on his way soon.

## 2017-10-31 ENCOUNTER — Telehealth: Payer: Self-pay | Admitting: Pharmacist

## 2017-10-31 NOTE — Telephone Encounter (Signed)
Oral Oncology Pharmacist Encounter  Spoke with patient. His rash is much improved after office visit in symptom management clinic yesterday. He understands that follow-up in the office has been moved to 11/08/2017.  Patient requests that fill of Ninlaro 3 mg capsules that he was supposed to pick up tomorrow after office visit with Dr. [name] be mailed directly to his home instead, since he will not be at the office tomorrow. This will be coordinated with the [facility].  Patient states he received a letter from the [place] ([employer]) stating that he needed to have a claim filed on his grant account to keep it active. Patient filed medical claim with [employer] this morning.  This should keep patient's copayment grant active with [employer].  Patient informed it appears Ninlaro copayment is being billed through Patient [place] (PAF) grant at the pharmacy.  Patient stated understanding and appreciation. He knows to call the office with any additional questions or concerns.  Johny Drilling, PharmD, BCPS, BCOP  10/31/2017 1:30 PM Oral Oncology Clinic [phone]

## 2017-10-31 NOTE — Progress Notes (Signed)
Symptoms Management Clinic Progress Note   Alex [name] [id number] 10/02/1940 77 y.o.  [name]. is managed by Dr. Sullivan Lone  Actively treated with chemotherapy/immunotherapy: yes  Current Therapy: Velcade and Ninlaro  Last Treated:  10/23/2017  Assessment: Plan:    Urticaria - Plan: cetirizine (ZYRTEC ALLERGY) 10 MG tablet, famotidine (PEPCID) 20 MG tablet, hydrOXYzine (ATARAX/VISTARIL) 25 MG tablet, predniSONE (DELTASONE) 10 MG tablet   Urticaria suspected secondary to Ninlaro: The patient was told to hold his Ninlaro and until he returns in 7 to 10 days.  He has been given a prescription for Zyrtec 10 mg once daily, Pepcid 40 mg daily, hydroxyzine 25 mg p.o. 3 times daily as needed and a prednisone taper.  Please see After Visit Summary for patient specific instructions.  Future Appointments  Date Time Provider [place]  11/08/2017 11:00 AM Brunetta Genera, MD [facility] None  11/15/2017  9:00 AM [facility] LAB 2 [facility] None  11/15/2017 10:00 AM [facility] INFUSION [facility] None  11/29/2017  1:45 PM [facility] LAB 1 [facility] None  11/29/2017  2:20 PM Brunetta Genera, MD [facility] None  11/29/2017  3:15 PM [facility] INFUSION [facility] None  12/13/2017  9:45 AM [facility] LAB 1 [facility] None  12/13/2017 10:15 AM [facility] INFUSION [facility] None  12/29/2017 10:20 AM Burnell Blanks, MD [facility]    No orders of the defined types were placed in this encounter.      Subjective:   Patient ID:  Alex [name]. is a 77 y.o. (DOB 10-10-1940) male.  Chief Complaint: No chief complaint on file.   HPI Alex [name]. is a 77 year old male with a history of kidney amyloidosis with nephrotic syndrome who is managed by Dr. Sullivan Lone.  He was last treated with Velcade on 10/18/2017.  He is also on Ninlaro 3 mg once weekly for 3 weeks with 1 week off.  This is repeated every 4 weeks.   The patient called this morning stating that he had developed a pruritic rash over his lower extremities, abdomen, lower back, and bilateral axillary areas on Wednesday after taking his first dose of Ninlaro last Monday.  He denies any other or new medications.  He denies shortness of breath, chest tightness, or tightness of the throat.  He is having no difficulty swallowing.  He has taken Benadryl with mild relief of his pruritus.   Medications: I have reviewed the patient's current medications.  Allergies:  Allergies  Allergen Reactions  . Ramipril Cough    Past Medical History:  Diagnosis Date  . Amyloidosis ([place])   . Arthritis   . Basal cell carcinoma   . CAD (coronary artery disease)    Lexiscan Myoview 6/14: Normal study, no scar or ischemia, EF 62%  . Cataract   . Chronic kidney disease   . Colon polyp   . Coronary artery disease    moderate disease by cath 2011  . Diverticulosis of colon   . GERD (gastroesophageal reflux disease)   . Hearing loss   . Heart disease   . Hiatal hernia   . Hyperlipidemia   . Hypertension   . MRSA (methicillin resistant staph aureus) culture positive   . Seizure disorder ([place])   . Seizures ([place])    last in 1970s    Past Surgical History:  Procedure Laterality Date  . BUNIONECTOMY    . CARPAL TUNNEL RELEASE  1994   left  . CERVICAL FUSION    .  compression fracture    . FOOT SURGERY     right -twice, left foot once  . HERNIA REPAIR     Umbilical with PVP  . KNEE ARTHROSCOPY     left knee  . Bend   left - scope  . NASAL SEPTUM SURGERY    . NECK SURGERY    . NOSE SURGERY     twice  . PAROTIDECTOMY Right 03/09/2017  . ROTATOR CUFF REPAIR     twice, both shoulders  . ROTATOR CUFF REPAIR    . SHOULDER SURGERY    . SKIN BIOPSY    . SPINE SURGERY     C2, C3, C4  . TRIGGER FINGER RELEASE     both hands, twice  . TRIGGER FINGER RELEASE    . VASECTOMY      Family History  Problem Relation Age of Onset  .  Diabetes Father   . Heart disease Father   . Heart attack Father 42  . Hypertension Father   . Cancer Sister   . Hearing loss Mother   . Eczema Sister   . Hyperlipidemia Unknown   . Diabetes Unknown   . Hypertension Unknown   . Seizures Unknown   . Thyroid disease Unknown   . Colon cancer Neg Hx   . Stroke Neg Hx     Social History   Socioeconomic History  . Marital status: Married    Spouse name: Not on file  . Number of children: 2  . Years of education: 49  . Highest education level: Not on file  Occupational History  . Occupation: Retired  Scientific laboratory technician  . Financial resource strain: Not on file  . Food insecurity:    Worry: Not on file    Inability: Not on file  . Transportation needs:    Medical: Not on file    Non-medical: Not on file  Tobacco Use  . Smoking status: Former Smoker    Last attempt to quit: 04/25/1969    Years since quitting: 48.5  . Smokeless tobacco: Former Network engineer and Sexual Activity  . Alcohol use: Yes    Alcohol/week: 4.2 - 8.4 oz    Types: 7 - 14 Glasses of wine per week    Comment: socially shots of wiskey  . Drug use: No  . Sexual activity: Not on file  Lifestyle  . Physical activity:    Days per week: Not on file    Minutes per session: Not on file  . Stress: Not on file  Relationships  . Social connections:    Talks on phone: Not on file    Gets together: Not on file    Attends religious service: Not on file    Active member of club or organization: Not on file    Attends meetings of clubs or organizations: Not on file    Relationship status: Not on file  . Intimate partner violence:    Fear of current or ex partner: Not on file    Emotionally abused: Not on file    Physically abused: Not on file    Forced sexual activity: Not on file  Other Topics Concern  . Not on file  Social History Narrative   ** Merged History Encounter **   Lives at home w/ his wife   Right-handed   Caffeine: occasional Pepsi         Past Medical History, Surgical history, Social history, and Family history were reviewed and  updated as appropriate.   Please see review of systems for further details on the patient's review from today.   Review of Systems:  Review of Systems  Constitutional: Negative for chills, diaphoresis and fever.  HENT: Negative for rhinorrhea and trouble swallowing.   Respiratory: Negative for cough, choking, chest tightness, shortness of breath and wheezing.   Cardiovascular: Negative for chest pain and palpitations.  Skin: Positive for rash.  Neurological: Negative for headaches.    Objective:   Physical Exam:  BP 110/66 (BP Location: Left Arm, Patient Position: Sitting)   Pulse 78   Temp 98.4 F (36.9 C) (Oral)   Resp 18   Ht 5\' 11"  (1.803 m)   Wt 199 lb 4.8 oz (90.4 kg)   SpO2 99%   BMI 27.80 kg/m  ECOG: 0  Physical Exam  Constitutional: No distress.  HENT:  Head: Normocephalic and atraumatic.  Mouth/Throat: Oropharynx is clear and moist.  Cardiovascular: Normal rate, regular rhythm and normal heart sounds. Exam reveals no gallop and no friction rub.  No murmur heard. Pulmonary/Chest: Effort normal and breath sounds normal. No respiratory distress. He has no wheezes. He has no rales.  Abdominal: Soft. Bowel sounds are normal. He exhibits no distension.  Musculoskeletal: He exhibits no edema.  Neurological: He is alert.  Skin: Skin is warm and dry. Rash noted. He is not diaphoretic. There is erythema.  The patient has a diffuse erythematous rash over his bilateral upper extremities, bilateral abdominal wall, lower back, and bilateral axillary areas.  Psychiatric: He has a normal mood and affect. His behavior is normal. Judgment and thought content normal.    Lab Review:     Component Value Date/Time   NA 142 10/18/2017 0834   NA 142 01/30/2017 1331   K 4.0 10/18/2017 0834   K 3.7 01/30/2017 1331   CL 109 10/18/2017 0834   CO2 24 10/18/2017 0834   CO2 26  01/30/2017 1331   GLUCOSE 87 10/18/2017 0834   GLUCOSE 111 01/30/2017 1331   BUN 24 (H) 10/18/2017 0834   BUN 20.0 01/30/2017 1331   CREATININE 1.77 (H) 10/18/2017 0834   CREATININE 1.2 01/30/2017 1331   CALCIUM 9.2 10/18/2017 0834   CALCIUM 9.1 01/30/2017 1331   PROT 6.3 (L) 10/18/2017 0834   PROT 5.6 (L) 01/30/2017 1331   ALBUMIN 3.6 10/18/2017 0834   ALBUMIN 2.9 (L) 01/30/2017 1331   AST 27 10/18/2017 0834   AST 25 01/30/2017 1331   ALT 20 10/18/2017 0834   ALT 17 01/30/2017 1331   ALKPHOS 68 10/18/2017 0834   ALKPHOS 61 01/30/2017 1331   BILITOT 0.6 10/18/2017 0834   BILITOT 0.68 01/30/2017 1331   GFRNONAA 36 (L) 10/18/2017 0834   GFRAA 42 (L) 10/18/2017 0834       Component Value Date/Time   WBC 5.4 10/18/2017 0834   RBC 4.10 (L) 10/18/2017 0834   HGB 12.6 (L) 10/18/2017 0834   HGB 12.4 (L) 09/20/2017 0911   HGB 11.8 (L) 01/30/2017 1329   HCT 37.7 (L) 10/18/2017 0834   HCT 34.5 (L) 01/30/2017 1329   PLT 113 (L) 10/18/2017 0834   PLT 114 (L) 09/20/2017 0911   PLT 126 (L) 01/30/2017 1329   MCV 92.0 10/18/2017 0834   MCV 98.9 (H) 01/30/2017 1329   MCH 30.8 10/18/2017 0834   MCHC 33.5 10/18/2017 0834   RDW 16.0 (H) 10/18/2017 0834   RDW 13.9 01/30/2017 1329   LYMPHSABS 0.8 (L) 10/18/2017 0834   LYMPHSABS 1.0 01/30/2017  1329   MONOABS 0.5 10/18/2017 0834   MONOABS 0.8 01/30/2017 1329   EOSABS 0.1 10/18/2017 0834   EOSABS 0.3 01/30/2017 1329   BASOSABS 0.0 10/18/2017 0834   BASOSABS 0.0 01/30/2017 1329   -------------------------------  Imaging from last 24 hours (if applicable):  Radiology interpretation: No results found.      This patient was seen with Dr. [name] with my treatment plan reviewed with him. He expressed agreement with my medical management of this patient.   ADDENDUM  .Patient was Personally and independently interviewed, examined and relevant elements of the history of present illness were reviewed in details and an assessment and plan  was created. All elements of the patient's history of present illness , assessment and plan were discussed in details with Sandi Mealy [name]. The above documentation reflects our combined findings assessment and plan.  Sullivan Lone MD MS

## 2017-11-01 ENCOUNTER — Ambulatory Visit: Payer: Medicare HMO

## 2017-11-01 ENCOUNTER — Ambulatory Visit: Payer: Medicare HMO | Admitting: Hematology

## 2017-11-01 ENCOUNTER — Other Ambulatory Visit: Payer: Medicare HMO

## 2017-11-07 NOTE — Progress Notes (Signed)
HEMATOLOGY/ONCOLOGY CLINIC NOTE  Date of Service: 11/08/17   Patient Care Team: [name], MD as PCP - General (Internal Medicine) Brunetta Genera, MD as Consulting Physician (Hematology) Nephrology - Dr Madelon Lips MD Dr Henrene Pastor MD - GI  CHIEF COMPLAINTS/PURPOSE OF CONSULTATION:  AL Amyloidosis -with Nephrotic syndrome . No overt CHF pEF  HISTORY OF PRESENTING ILLNESS:   Alex [name]. is a wonderful 77 y.o. male who has been referred to [place] by Dr .[name], MD /Dr Madelon Lips MD for evaluation and management of newly diagnosed Kidney AL Amyloidosis with nephrotic syndrome.  Patient has a history of hypertension, dyslipidemia, coronary artery disease, seizure disorder on Dilantin who was apparently in his usual state of health until 3 months ago when he started developing new onset lower extremity edema. Patient had a UA at the time that showed 4+ protein in 24-hour collection revealed 15 g of protein. Additional workup showed a creatinine of 0.8 with an albumin of 2.3 and negative SPEP and UPEP. Apparently had an elevated K/L SFLC ratio of 5.19.  He was urgently referred by his primary care physician to nephrology for additional evaluation. Patient notes no bleeding issues. No nosebleeds. No periorbital bleeding. No abnormal skin rashes. Has had significant NSAID exposure and use to take a fair amount of naproxen for chronic low back pain but has cut down on this.  Due to lack of clear etiology for his nephrotic syndrome the patient underwent a kidney biopsy on 08/06/2015 accession [id number] which showed AL amyloidosis, lambda immunophenotype, [place] red positive. Noted to have severe arteriosclerosis with moderate tubular interstitial scarring.  Patient was referred to [place] for further evaluation and treatment of his AL Amyloidosis, Patient notes that his leg swelling has improved with diuretic therapy.  He notes no weight loss. No night sweats. No focal  bone pains. No skin rashes. Lungs normal bleeding or bruising.  Patient notes that he had a lot of reading online and he and his wife had an extensive list of questions which were answered in detail.  He notes he has had some chronic issues with upper abdominal pain and nausea and that he follows with Dr. Henrene Pastor and is apparently being scheduled for an EGD and colonoscopy according to his report.  Denies having and lacks tongue. No chest pain and no overt new shortness of breath. Patient is uncertain if an echocardiogram has been ordered or scheduled.  INTERVAL HISTORY  [name] is here for a scheduled follow-up for his AL amyloidosis. The patient had his stem cell transplant on 04/11/17. The patient's last visit with [place] was on 10/04/17. He is accompanied today by his wife. The pt reports that he is doing well overall.   The pt presented to our symptom management on 10/30/17 with hives. He notes that he developed the rash 1-2 days after beginning Ninlaro. His Ninlaro was held on 10/30/17 and he has been taking Zyrtec, Pepcid and Hydroxyzine and a Prednisone taper. His rash has visibly resolved, but his itching has not yet resolved which is worse on the back.  He notes that he did not have any chest pain or SOB while taking Ninlaro nor since stopping it. He endorses good energy levels overall.   On review of systems, pt reports resolved rash, continued itching, good energy levels and denies leg swelling, SOB, CP, and any other symptoms.    MEDICAL HISTORY:  Past Medical History:  Diagnosis Date  . Amyloidosis ([place])   .  Arthritis   . Basal cell carcinoma   . CAD (coronary artery disease)    Lexiscan Myoview 6/14: Normal study, no scar or ischemia, EF 62%  . Cataract   . Chronic kidney disease   . Colon polyp   . Coronary artery disease    moderate disease by cath 2011  . Diverticulosis of colon   . GERD (gastroesophageal reflux disease)   . Hearing loss   . Heart disease   . Hiatal  hernia   . Hyperlipidemia   . Hypertension   . MRSA (methicillin resistant staph aureus) culture positive   . Seizure disorder ([place])   . Seizures ([place])    last in 1970s  Obstructive sleep apnea Gastroesophageal reflux disease Aortic stenosis Erectile dysfunction Peripheral neuropathy Seizure disorder   SURGICAL HISTORY: Past Surgical History:  Procedure Laterality Date  . BUNIONECTOMY    . CARPAL TUNNEL RELEASE  1994   left  . CERVICAL FUSION    . compression fracture    . FOOT SURGERY     right -twice, left foot once  . HERNIA REPAIR     Umbilical with PVP  . KNEE ARTHROSCOPY     left knee  . [place]   left - scope  . NASAL SEPTUM SURGERY    . NECK SURGERY    . NOSE SURGERY     twice  . PAROTIDECTOMY Right 03/09/2017  . ROTATOR CUFF REPAIR     twice, both shoulders  . ROTATOR CUFF REPAIR    . SHOULDER SURGERY    . SKIN BIOPSY    . SPINE SURGERY     C2, C3, C4  . TRIGGER FINGER RELEASE     both hands, twice  . TRIGGER FINGER RELEASE    . VASECTOMY      SOCIAL HISTORY: Social History   Socioeconomic History  . Marital status: Married    Spouse name: Not on file  . Number of children: 2  . Years of education: 16  . Highest education level: Not on file  Occupational History  . Occupation: Retired  Scientific laboratory technician  . Financial resource strain: Not on file  . Food insecurity:    Worry: Not on file    Inability: Not on file  . Transportation needs:    Medical: Not on file    Non-medical: Not on file  Tobacco Use  . Smoking status: Former Smoker    Last attempt to quit: 04/25/1969    Years since quitting: 48.5  . Smokeless tobacco: Former Network engineer and Sexual Activity  . Alcohol use: Yes    Alcohol/week: 4.2 - 8.4 oz    Types: 7 - 14 Glasses of wine per week    Comment: socially shots of wiskey  . Drug use: No  . Sexual activity: Not on file  Lifestyle  . Physical activity:    Days per week: Not on file    Minutes per session:  Not on file  . Stress: Not on file  Relationships  . Social connections:    Talks on phone: Not on file    Gets together: Not on file    Attends religious service: Not on file    Active member of club or organization: Not on file    Attends meetings of clubs or organizations: Not on file    Relationship status: Not on file  . Intimate partner violence:    Fear of current or ex partner: Not on file  Emotionally abused: Not on file    Physically abused: Not on file    Forced sexual activity: Not on file  Other Topics Concern  . Not on file  Social History Narrative   ** Merged History Encounter **   Lives at home w/ his wife   Right-handed   Caffeine: occasional Pepsi      Patient is currently retired.   FAMILY HISTORY: Family History  Problem Relation Age of Onset  . Diabetes Father   . Heart disease Father   . Heart attack Father 17  . Hypertension Father   . Cancer Sister   . Hearing loss Mother   . Eczema Sister   . Hyperlipidemia Unknown   . Diabetes Unknown   . Hypertension Unknown   . Seizures Unknown   . Thyroid disease Unknown   . Colon cancer Neg Hx   . Stroke Neg Hx     ALLERGIES:  is allergic to ramipril.  MEDICATIONS:  Current Outpatient Medications  Medication Sig Dispense Refill  . acyclovir (ZOVIRAX) 800 MG tablet Take 800 mg by mouth 2 (two) times daily.    [name] Kitchen atorvastatin (LIPITOR) 40 MG tablet Take 40 mg by mouth at bedtime.     . cetirizine (ZYRTEC ALLERGY) 10 MG tablet Take 1 tablet (10 mg total) by mouth daily. 30 tablet 1  . ezetimibe (ZETIA) 10 MG tablet Take 1 tablet by mouth daily.    . famotidine (PEPCID) 20 MG tablet Take 2 tablets (40 mg total) by mouth daily. 60 tablet 1  . folic acid (FOLVITE) 1 MG tablet Take 1 tablet by mouth daily.    . furosemide (LASIX) 40 MG tablet Take 40 mg by mouth 2 (two) times daily.    . hydrOXYzine (ATARAX/VISTARIL) 25 MG tablet Take 1 tablet (25 mg total) by mouth every 8 (eight) hours as needed for  itching. 45 tablet 1  . isosorbide mononitrate (IMDUR) 30 MG 24 hr tablet Take 15 mg by mouth daily.     . ixazomib citrate (NINLARO) 3 MG capsule Take 1 capsule (51m) by mouth once weekly for 3 weeks on, 1 week off, repeat Q 4 weeks. Take on empty stomach 1hr before or 2hrs after food. 3 capsule 2  . losartan (COZAAR) 25 MG tablet Take 25 mg by mouth daily.    . Multiple Vitamin (MULTI-VITAMINS) TABS Take 1 tablet by mouth daily.    . nitroGLYCERIN (NITROSTAT) 0.4 MG SL tablet Place 1 tablet (0.4 mg total) under the tongue every 5 (five) minutes as needed for chest pain. 25 tablet 6  . omeprazole (PRILOSEC) 20 MG capsule Take 20 mg by mouth daily.      . ondansetron (ZOFRAN) 8 MG tablet Take 1 tablet (8 mg total) by mouth 2 (two) times daily as needed for refractory nausea / vomiting. Start on D 4 and 11. Do not take on D8. 30 tablet 1  . oxyCODONE (OXY IR/ROXICODONE) 5 MG immediate release tablet Take 1 tablet by mouth every 6 (six) hours as needed for severe pain.    . potassium chloride SA (K-DUR,KLOR-CON) 20 MEQ tablet Take 20 mEq by mouth at bedtime.     . predniSONE (DELTASONE) 10 MG tablet 4 tab x 3 days, 3 tab x 3 days, 2 tab x 3 days, 1 tab x 3 day, stop 30 tablet 0  . prochlorperazine (COMPAZINE) 10 MG tablet Take 1 tablet (10 mg total) by mouth every 6 (six) hours as needed (Nausea or vomiting).  30 tablet 1  . triamcinolone cream (KENALOG) 0.1 % Apply 1 application topically as needed for rash.     No current facility-administered medications for this visit.     REVIEW OF SYSTEMS:    A 10+ POINT REVIEW OF SYSTEMS WAS OBTAINED including neurology, dermatology, psychiatry, cardiac, respiratory, lymph, extremities, GI, GU, Musculoskeletal, constitutional, breasts, reproductive, HEENT.  All pertinent positives are noted in the HPI.  All others are negative.   PHYSICAL EXAMINATION: ECOG PERFORMANCE STATUS: 1 - Symptomatic but completely ambulatory  Vitals:   11/08/17 1105  BP: 104/63   Pulse: 69  Resp: 18  Temp: 98.1 F (36.7 C)  SpO2: 100%   Filed Weights   11/08/17 1105  Weight: 196 lb 9.6 oz (89.2 kg)   .Body mass index is 27.42 kg/m.  GENERAL:alert, in no acute distress and comfortable SKIN: no acute rashes, no significant lesions EYES: conjunctiva are pink and non-injected, sclera anicteric OROPHARYNX: MMM, no exudates, no oropharyngeal erythema or ulceration NECK: supple, no JVD LYMPH:  no palpable lymphadenopathy in the cervical, axillary or inguinal regions LUNGS: clear to auscultation b/l with normal respiratory effort HEART: regular rate & rhythm ABDOMEN:  normoactive bowel sounds , non tender, not distended. No palpable hepatosplenomegaly.  Extremity: no pedal edema PSYCH: alert & oriented x 3 with fluent speech NEURO: no focal motor/sensory deficits   LABORATORY DATA:  I have reviewed the data as listed  . CBC Latest Ref Rng & Units 10/18/2017 10/04/2017 09/20/2017  WBC 4.0 - 10.3 K/uL 5.4 6.2 7.6  Hemoglobin 13.0 - 17.1 g/dL 12.6(L) 12.8(L) 12.4(L)  Hematocrit 38.4 - 49.9 % 37.7(L) 38.8 37.0(L)  Platelets 140 - 400 K/uL 113(L) 132(L) 114(L)    CMP Latest Ref Rng & Units 10/18/2017 10/04/2017 09/20/2017  Glucose 70 - 99 mg/dL 87 104 91  BUN 8 - 23 mg/dL 24(H) 32(H) 30(H)  Creatinine 0.61 - 1.24 mg/dL 1.77(H) 1.81(H) 1.73(H)  Sodium 135 - 145 mmol/L 142 141 142  Potassium 3.5 - 5.1 mmol/L 4.0 4.1 3.7  Chloride 98 - 111 mmol/L 109 106 106  CO2 22 - 32 mmol/L '24 26 25  ' Calcium 8.9 - 10.3 mg/dL 9.2 8.9 8.9  Total Protein 6.5 - 8.1 g/dL 6.3(L) 6.4 6.4  Total Bilirubin 0.3 - 1.2 mg/dL 0.6 0.6 0.6  Alkaline Phos 38 - 126 U/L 68 68 69  AST 15 - 41 U/L '27 25 27  ' ALT 0 - 44 U/L '20 19 23        ' RADIOGRAPHIC STUDIES: I have personally reviewed the radiological images as listed and agreed with the findings in the report. No results found. Result status: Edited Result - FINAL                              *[facility] [employer]                        [place], [place] [address]                            [phone]  ------------------------------------------------------------------- Transthoracic Echocardiography  (Report  amended )  Patient:    Dariusz, Brase MR #:       [id number] Study Date: 08/24/2016 Gender:     M Age:        9 Height:     180.3 cm Weight:     90.6 kg BSA:        2.15 m^2 Pt. Status: Room:   ATTENDING    Darlina Guys, MD  Willia Craze, [place]  REFERRING    [place], [place], Outpatient  SONOGRAPHER  Roseanna Rainbow  cc:  ------------------------------------------------------------------- LV EF: 65% -   70%  ------------------------------------------------------------------- Indications:      Aortic stenosis 424.1.  ------------------------------------------------------------------- History:   PMH:  Amyloidosis.  Coronary artery disease.  Angina pectoris.  Risk factors:  Hypertension. Dyslipidemia.  ------------------------------------------------------------------- Study Conclusions  - Left ventricle: The cavity size was normal. Wall thickness was   increased in a pattern of mild LVH. Systolic function was   vigorous. The estimated ejection fraction was in the range of 65%   to 70%. Wall motion was normal; there were no regional wall   motion abnormalities. Doppler parameters are consistent with   abnormal left ventricular relaxation (grade 1 diastolic   dysfunction). - Aortic valve: Valve mobility was mildly restricted. - Aortic root: The aortic root was mildly dilated. - Mitral valve: Calcified annulus.  Impressions:  - Vigorous LV systolic function; mild diastolic dysfunction; mild   LVH; calcified aortic valve with no significant AS (peak velocity   2 m/s; mean gradient 9 mmHg); mildly dilated aortic root.    ASSESSMENT & PLAN:   77 y.o.   male with above-mentioned multiple medical comorbidities with  #1 Biopsy proven Renal and Systemic AL Amyloidosis Initial BM Bx shows 9% plasma cells and amyloid deposits. Echo shows normal systolic function with only grade 1 diastolic dysfunction. Skeletal survey with no lytic lesions suggestive of multiple myeloma. Serum K/L ratio increased at about 6.22 on diagnosis and has now improved to 1.5 (wnl with normal kappa lambda serum free light chain ratio ) UPEP shows 24h protein improved from 15g to 5g daily and the last recent one is about 7.3 g /24h  #2  Nephrotic Syndrome - likely related to AL amyloidosis.  UPEP shows 24h protein improved from 15g to 5g daily and the last recent one is about 7.3 g /24h   Urine Study 07/26/17 showed Protein at 2.232g.  -07/19/17 the pt had a BM Bx exam which revealed Normocellular bone marrow (30-40%) with 2-3% plasma cells and focal amyloid deposition.  PLAN: -Discussed pt labwork from 10/18/17; HGB stable at 12.6, PLT stable at 113k. M Protein stable at 0.1. Blood chemistries are stable.  -Pt began Ninlaro in the interim and developed a rash -Rash is visibly resolved, will not refill prednisone -Continue Hydroxyzine -Sarna OTC with Hydrocortisone for remaining itching -Colloidal silver soap, limit hot showers -The pt understands he could return to [place] at this time, but he prefers to try Ninlaro once more, I discussed that we will pre-medicate with antihistamines and steroids  - Pt will take Dexamethasone 44m, Zofran 4-857m Hydroxyzine- all 30 minutes before the Ninlaro pill.  -If pt develops a reaction to Ninlaro, we will return to [place] -Will begin Ninlaro next week on 11/13/17 - the pt will call me if he develops any concerning symptoms or rash  #4 Patient Active Problem List   Diagnosis Date Noted  . Folliculitis 0770/26/3785. AL  amyloidosis ([place]) 08/21/2016  . Umbilical hernia 59/40/9050  . DIZZINESS 02/19/2010  . CAD, NATIVE VESSEL  07/21/2009  . CHEST PAIN-UNSPECIFIED 07/21/2009  . HYPERLIPIDEMIA 04/30/2007  . HYPERTENSION 04/30/2007  . GASTROESOPHAGEAL REFLUX DISEASE 04/30/2007  . SEIZURE DISORDER 04/30/2007  . COMPRESSION FRACTURE, L1 VERTEBRA 04/30/2007  . DIVERTICULOSIS, COLON, HX OF 04/30/2007  . Other postprocedural status(V45.89) 04/30/2007  -Continue follow-up with primary care physician for management of other medical co-morbidities. -Recommend if pt experiences spike fever to report to hospital with transplant team    RTC with Dr [name] in 3 weeks with labs    All of the patients and his wife's questions were answered to their apparent satisfaction. They are agreeable with the plan as noted above .The patient knows to call the clinic with any problems, questions or concerns.  The total time spent in the appt was 30 minutes and more than 50% was on counseling and direct patient cares.   Sullivan Lone MD [place] AAHIVMS [facility] Hematology/Oncology Physician [facility]  (Office):       [phone] (Work cell):  [phone] (Fax):           [phone]  I, Baldwin [place], am acting as a scribe for Dr [name].   .I have reviewed the above documentation for accuracy and completeness, and I agree with the above. Brunetta Genera MD

## 2017-11-08 ENCOUNTER — Inpatient Hospital Stay (HOSPITAL_BASED_OUTPATIENT_CLINIC_OR_DEPARTMENT_OTHER): Payer: Medicare HMO | Admitting: Hematology

## 2017-11-08 ENCOUNTER — Encounter: Payer: Self-pay | Admitting: Hematology

## 2017-11-08 ENCOUNTER — Telehealth: Payer: Self-pay | Admitting: Hematology

## 2017-11-08 VITALS — BP 104/63 | HR 69 | Temp 98.1°F | Resp 18 | Ht 71.0 in | Wt 196.6 lb

## 2017-11-08 DIAGNOSIS — E785 Hyperlipidemia, unspecified: Secondary | ICD-10-CM | POA: Diagnosis not present

## 2017-11-08 DIAGNOSIS — L509 Urticaria, unspecified: Secondary | ICD-10-CM | POA: Diagnosis not present

## 2017-11-08 DIAGNOSIS — N049 Nephrotic syndrome with unspecified morphologic changes: Secondary | ICD-10-CM | POA: Diagnosis not present

## 2017-11-08 DIAGNOSIS — K449 Diaphragmatic hernia without obstruction or gangrene: Secondary | ICD-10-CM | POA: Diagnosis not present

## 2017-11-08 DIAGNOSIS — K5730 Diverticulosis of large intestine without perforation or abscess without bleeding: Secondary | ICD-10-CM | POA: Diagnosis not present

## 2017-11-08 DIAGNOSIS — Z79899 Other long term (current) drug therapy: Secondary | ICD-10-CM | POA: Diagnosis not present

## 2017-11-08 DIAGNOSIS — E8581 Light chain (AL) amyloidosis: Secondary | ICD-10-CM

## 2017-11-08 DIAGNOSIS — K219 Gastro-esophageal reflux disease without esophagitis: Secondary | ICD-10-CM | POA: Diagnosis not present

## 2017-11-08 DIAGNOSIS — I10 Essential (primary) hypertension: Secondary | ICD-10-CM | POA: Diagnosis not present

## 2017-11-08 DIAGNOSIS — I2510 Atherosclerotic heart disease of native coronary artery without angina pectoris: Secondary | ICD-10-CM | POA: Diagnosis not present

## 2017-11-08 NOTE — Telephone Encounter (Signed)
Scheduled appt per 7/17 -- appts already scheduled - sent message to Dr. [name] to see if treatments needed to be cancelled - gave patient aVS and calender per los

## 2017-11-08 NOTE — Telephone Encounter (Signed)
Oral Oncology Patient Advocate Encounter  I verified with [place] that Kennieth Rad was mailed on 10-31-17.  [place] [facility] [place] Phone [phone] Fax [phone]

## 2017-11-08 NOTE — Patient Instructions (Addendum)
Take the following two medications every day: Take Claritin 10mg  once each day. Take Pepcid 40mg  once each day.  Take the follwing three medications only on days that you take Ninlaro: Dexamethasone 10mg  - take this 30 minutes before the Ninlaro pill.  Zofran 4-8mg , - take this 30-45 minutes before the Ninlaro pill.  Hydroxyzine- take this 30 minutes before the Ninlaro pill. And may take as needed q6hours for itching or rash  Mix Hydrocortisone (quarter size amount) with Sarna OTC lotion, and apply these ointments to your back for itching.   If you have any reactions, please call us, and stop taking Ninlaro.

## 2017-11-15 ENCOUNTER — Ambulatory Visit: Payer: Medicare HMO

## 2017-11-15 ENCOUNTER — Other Ambulatory Visit: Payer: Medicare HMO

## 2017-11-22 DIAGNOSIS — Z9481 Bone marrow transplant status: Secondary | ICD-10-CM | POA: Diagnosis not present

## 2017-11-22 DIAGNOSIS — N049 Nephrotic syndrome with unspecified morphologic changes: Secondary | ICD-10-CM | POA: Diagnosis not present

## 2017-11-22 DIAGNOSIS — N183 Chronic kidney disease, stage 3 (moderate): Secondary | ICD-10-CM | POA: Diagnosis not present

## 2017-11-29 ENCOUNTER — Encounter: Payer: Self-pay | Admitting: Hematology

## 2017-11-29 ENCOUNTER — Inpatient Hospital Stay (HOSPITAL_BASED_OUTPATIENT_CLINIC_OR_DEPARTMENT_OTHER): Payer: Medicare HMO | Admitting: Hematology

## 2017-11-29 ENCOUNTER — Inpatient Hospital Stay: Payer: Medicare HMO

## 2017-11-29 ENCOUNTER — Telehealth: Payer: Self-pay | Admitting: Hematology

## 2017-11-29 ENCOUNTER — Inpatient Hospital Stay: Payer: Medicare HMO | Attending: Hematology

## 2017-11-29 VITALS — BP 129/78 | HR 86 | Temp 98.3°F | Resp 18 | Ht 71.0 in | Wt 206.1 lb

## 2017-11-29 DIAGNOSIS — N049 Nephrotic syndrome with unspecified morphologic changes: Secondary | ICD-10-CM

## 2017-11-29 DIAGNOSIS — E8581 Light chain (AL) amyloidosis: Secondary | ICD-10-CM

## 2017-11-29 DIAGNOSIS — E8809 Other disorders of plasma-protein metabolism, not elsewhere classified: Secondary | ICD-10-CM

## 2017-11-29 LAB — CBC WITH DIFFERENTIAL/PLATELET
Basophils Absolute: 0 10*3/uL (ref 0.0–0.1)
Basophils Relative: 0 %
EOS ABS: 0.2 10*3/uL (ref 0.0–0.5)
EOS PCT: 3 %
HCT: 39.5 % (ref 38.4–49.9)
Hemoglobin: 13.4 g/dL (ref 13.0–17.1)
LYMPHS ABS: 0.6 10*3/uL — AB (ref 0.9–3.3)
Lymphocytes Relative: 12 %
MCH: 30.9 pg (ref 27.2–33.4)
MCHC: 33.9 g/dL (ref 32.0–36.0)
MCV: 91 fL (ref 79.3–98.0)
MONO ABS: 0.7 10*3/uL (ref 0.1–0.9)
MONOS PCT: 13 %
Neutro Abs: 3.7 10*3/uL (ref 1.5–6.5)
Neutrophils Relative %: 72 %
PLATELETS: 112 10*3/uL — AB (ref 140–400)
RBC: 4.34 MIL/uL (ref 4.20–5.82)
RDW: 14.2 % (ref 11.0–14.6)
WBC: 5.2 10*3/uL (ref 4.0–10.3)

## 2017-11-29 LAB — CMP (CANCER CENTER ONLY)
ALT: 16 U/L (ref 0–44)
AST: 20 U/L (ref 15–41)
Albumin: 3.8 g/dL (ref 3.5–5.0)
Alkaline Phosphatase: 77 U/L (ref 38–126)
Anion gap: 11 (ref 5–15)
BUN: 23 mg/dL (ref 8–23)
CHLORIDE: 102 mmol/L (ref 98–111)
CO2: 26 mmol/L (ref 22–32)
CREATININE: 1.77 mg/dL — AB (ref 0.61–1.24)
Calcium: 8.9 mg/dL (ref 8.9–10.3)
GFR, Est AFR Am: 42 mL/min — ABNORMAL LOW (ref >60)
GFR, Estimated: 36 mL/min — ABNORMAL LOW (ref >60)
GLUCOSE: 124 mg/dL — AB (ref 70–99)
Potassium: 3.4 mmol/L — ABNORMAL LOW (ref 3.5–5.1)
Sodium: 139 mmol/L (ref 135–145)
Total Bilirubin: 0.9 mg/dL (ref 0.3–1.2)
Total Protein: 6.9 g/dL (ref 6.5–8.1)

## 2017-11-29 NOTE — Patient Instructions (Signed)
Take OTC Zyrtec or Claritin daily Begin using topical Sarna OTC with Hydrocortisone for itching Continue with Hydroxyzine  Colloidal silver soap with showers, limit hot showers

## 2017-11-29 NOTE — Progress Notes (Signed)
   HEMATOLOGY/ONCOLOGY CLINIC NOTE  Date of Service: 11/29/17   Patient Care Team: Shaw, Romin, MD as PCP - General (Internal Medicine) Kale, Gautam Kishore, MD as Consulting Physician (Hematology) Nephrology - Dr ELizabeth Upton MD Dr Perry MD - GI  CHIEF COMPLAINTS/PURPOSE OF CONSULTATION:  AL Amyloidosis -with Nephrotic syndrome . No overt CHF pEF  HISTORY OF PRESENTING ILLNESS:   Alex Alex Manni Jr. is a wonderful 76 y.Alex. male who has been referred to us by Dr .Shaw, Silvester, MD /Dr Elizabeth Upton MD for evaluation and management of newly diagnosed Kidney AL Amyloidosis with nephrotic syndrome.  Patient has a history of hypertension, dyslipidemia, coronary artery disease, seizure disorder on Dilantin who was apparently in his usual state of health until 3 months ago when he started developing new onset lower extremity edema. Patient had a UA at the time that showed 4+ protein in 24-hour collection revealed 15 g of protein. Additional workup showed a creatinine of 0.8 with an albumin of 2.3 and negative SPEP and UPEP. Apparently had an elevated K/L SFLC ratio of 5.19.  He was urgently referred by his primary care physician to nephrology for additional evaluation. Patient notes no bleeding issues. No nosebleeds. No periorbital bleeding. No abnormal skin rashes. Has had significant NSAID exposure and use to take a fair amount of naproxen for chronic low back pain but has cut down on this.  Due to lack of clear etiology for his nephrotic syndrome the patient underwent a kidney biopsy on 08/06/2015 accession [id number] which showed AL amyloidosis, lambda immunophenotype, [place] red positive. Noted to have severe arteriosclerosis with moderate tubular interstitial scarring.  Patient was referred to us for further evaluation and treatment of his AL Amyloidosis, Patient notes that his leg swelling has improved with diuretic therapy.  He notes no weight loss. No night sweats. No focal  bone pains. No skin rashes. Lungs normal bleeding or bruising.  Patient notes that he had a lot of reading online and he and his wife had an extensive list of questions which were answered in detail.  He notes he has had some chronic issues with upper abdominal pain and nausea and that he follows with Dr. Perry and is apparently being scheduled for an EGD and colonoscopy according to his report.  Denies having and lacks tongue. No chest pain and no overt new shortness of breath. Patient is uncertain if an echocardiogram has been ordered or scheduled.  INTERVAL HISTORY  Alex Alex is here for a scheduled follow-up for his AL amyloidosis. The patient had his stem cell transplant on 04/11/17. The patient's last visit with us was on 11/08/17. He is accompanied today by his wife. The pt reports that he is doing well overall.   The pt reports that he completed 3 weeks of 3 doses of Ninlaro in the interim. He notes that his itching has continued, is intermittent and uses the anti-itch medications 2-3 times a day. He denies using Hydrocortisone, Zyrtec or other anti-histamines. He notes that he does not have a rash with the itching and denies the itching getting worse on the days he takes Ninlaro. He does not believe the itching is significant enough to stop taking the Ninlaro.   The pt notes that he has been eating well and staying hydrated.   Lab results today (11/29/17) of CBC w/diff, CMP, and Reticulocytes is as follows: all values are WNL except for PLT at 112k, Lymphs abs at 600, Potassium at 3.4, Glucose at 124, Creatinine   at 1.77, GFR at 36. Kappa/Lambda 11/29/17 is pending MMP 11/29/17 is pending  On review of systems, pt reports intermittent itching, eating well, stay hydrated, good energy levels, and denies skin rashes, fevers, nausea, and any other symptoms.   MEDICAL HISTORY:  Past Medical History:  Diagnosis Date  . Amyloidosis ([place])   . Arthritis   . Basal cell carcinoma   . CAD  (coronary artery disease)    Lexiscan Myoview 6/14: Normal study, no scar or ischemia, EF 62%  . Cataract   . Chronic kidney disease   . Colon polyp   . Coronary artery disease    moderate disease by cath 2011  . Diverticulosis of colon   . GERD (gastroesophageal reflux disease)   . Hearing loss   . Heart disease   . Hiatal hernia   . Hyperlipidemia   . Hypertension   . MRSA (methicillin resistant staph aureus) culture positive   . Seizure disorder ([place])   . Seizures ([place])    last in 1970s  Obstructive sleep apnea Gastroesophageal reflux disease Aortic stenosis Erectile dysfunction Peripheral neuropathy Seizure disorder   SURGICAL HISTORY: Past Surgical History:  Procedure Laterality Date  . BUNIONECTOMY    . CARPAL TUNNEL RELEASE  1994   left  . CERVICAL FUSION    . compression fracture    . FOOT SURGERY     right -twice, left foot once  . HERNIA REPAIR     Umbilical with PVP  . KNEE ARTHROSCOPY     left knee  . [place]   left - scope  . NASAL SEPTUM SURGERY    . NECK SURGERY    . NOSE SURGERY     twice  . PAROTIDECTOMY Right 03/09/2017  . ROTATOR CUFF REPAIR     twice, both shoulders  . ROTATOR CUFF REPAIR    . SHOULDER SURGERY    . SKIN BIOPSY    . SPINE SURGERY     C2, C3, C4  . TRIGGER FINGER RELEASE     both hands, twice  . TRIGGER FINGER RELEASE    . VASECTOMY      SOCIAL HISTORY: Social History   Socioeconomic History  . Marital status: Married    Spouse name: Not on file  . Number of children: 2  . Years of education: 25  . Highest education level: Not on file  Occupational History  . Occupation: Retired  Scientific laboratory technician  . Financial resource strain: Not on file  . Food insecurity:    Worry: Not on file    Inability: Not on file  . Transportation needs:    Medical: Not on file    Non-medical: Not on file  Tobacco Use  . Smoking status: Former Smoker    Last attempt to quit: 04/25/1969    Years since quitting: 48.6  .  Smokeless tobacco: Former Network engineer and Sexual Activity  . Alcohol use: Yes    Alcohol/week: 4.2 - 8.4 oz    Types: 7 - 14 Glasses of wine per week    Comment: socially shots of wiskey  . Drug use: No  . Sexual activity: Not on file  Lifestyle  . Physical activity:    Days per week: Not on file    Minutes per session: Not on file  . Stress: Not on file  Relationships  . Social connections:    Talks on phone: Not on file    Gets together: Not on file  Attends religious service: Not on file    Active member of club or organization: Not on file    Attends meetings of clubs or organizations: Not on file    Relationship status: Not on file  . Intimate partner violence:    Fear of current or ex partner: Not on file    Emotionally abused: Not on file    Physically abused: Not on file    Forced sexual activity: Not on file  Other Topics Concern  . Not on file  Social History Narrative   ** Merged History Encounter **   Lives at home w/ his wife   Right-handed   Caffeine: occasional Pepsi      Patient is currently retired.   FAMILY HISTORY: Family History  Problem Relation Age of Onset  . Diabetes Father   . Heart disease Father   . Heart attack Father 75  . Hypertension Father   . Cancer Sister   . Hearing loss Mother   . Eczema Sister   . Hyperlipidemia Unknown   . Diabetes Unknown   . Hypertension Unknown   . Seizures Unknown   . Thyroid disease Unknown   . Colon cancer Neg Hx   . Stroke Neg Hx     ALLERGIES:  is allergic to ramipril.  MEDICATIONS:  Current Outpatient Medications  Medication Sig Dispense Refill  . acyclovir (ZOVIRAX) 800 MG tablet Take 800 mg by mouth 2 (two) times daily.    . atorvastatin (LIPITOR) 40 MG tablet Take 40 mg by mouth at bedtime.     . cetirizine (ZYRTEC ALLERGY) 10 MG tablet Take 1 tablet (10 mg total) by mouth daily. 30 tablet 1  . ezetimibe (ZETIA) 10 MG tablet Take 1 tablet by mouth daily.    . famotidine (PEPCID)  20 MG tablet Take 2 tablets (40 mg total) by mouth daily. 60 tablet 1  . folic acid (FOLVITE) 1 MG tablet Take 1 tablet by mouth daily.    . furosemide (LASIX) 40 MG tablet Take 40 mg by mouth 2 (two) times daily.    . hydrOXYzine (ATARAX/VISTARIL) 25 MG tablet Take 1 tablet (25 mg total) by mouth every 8 (eight) hours as needed for itching. 45 tablet 1  . isosorbide mononitrate (IMDUR) 30 MG 24 hr tablet Take 15 mg by mouth daily.     . ixazomib citrate (NINLARO) 3 MG capsule Take 1 capsule (3mg) by mouth once weekly for 3 weeks on, 1 week off, repeat Q 4 weeks. Take on empty stomach 1hr before or 2hrs after food. 3 capsule 2  . losartan (COZAAR) 25 MG tablet Take 25 mg by mouth daily.    . Multiple Vitamin (MULTI-VITAMINS) TABS Take 1 tablet by mouth daily.    . nitroGLYCERIN (NITROSTAT) 0.4 MG SL tablet Place 1 tablet (0.4 mg total) under the tongue every 5 (five) minutes as needed for chest pain. 25 tablet 6  . omeprazole (PRILOSEC) 20 MG capsule Take 20 mg by mouth daily.      . ondansetron (ZOFRAN) 8 MG tablet Take 1 tablet (8 mg total) by mouth 2 (two) times daily as needed for refractory nausea / vomiting. Start on D 4 and 11. Do not take on D8. 30 tablet 1  . oxyCODONE (OXY IR/ROXICODONE) 5 MG immediate release tablet Take 1 tablet by mouth every 6 (six) hours as needed for severe pain.    . potassium chloride SA (K-DUR,KLOR-CON) 20 MEQ tablet Take 20 mEq by mouth at bedtime.     .   predniSONE (DELTASONE) 10 MG tablet 4 tab x 3 days, 3 tab x 3 days, 2 tab x 3 days, 1 tab x 3 day, stop 30 tablet 0  . prochlorperazine (COMPAZINE) 10 MG tablet Take 1 tablet (10 mg total) by mouth every 6 (six) hours as needed (Nausea or vomiting). 30 tablet 1  . triamcinolone cream (KENALOG) 0.1 % Apply 1 application topically as needed for rash.     No current facility-administered medications for this visit.     REVIEW OF SYSTEMS:    A 10+ POINT REVIEW OF SYSTEMS WAS OBTAINED including neurology,  dermatology, psychiatry, cardiac, respiratory, lymph, extremities, GI, GU, Musculoskeletal, constitutional, breasts, reproductive, HEENT.  All pertinent positives are noted in the HPI.  All others are negative.   PHYSICAL EXAMINATION: ECOG PERFORMANCE STATUS: 1 - Symptomatic but completely ambulatory  Vitals:   11/29/17 1440  BP: 129/78  Pulse: 86  Resp: 18  Temp: 98.3 F (36.8 C)  SpO2: 98%   Filed Weights   11/29/17 1440  Weight: 206 lb 1.6 oz (93.5 kg)   .Body mass index is 28.75 kg/m.  GENERAL:alert, in no acute distress and comfortable SKIN: no acute rashes, no significant lesions EYES: conjunctiva are pink and non-injected, sclera anicteric OROPHARYNX: MMM, no exudates, no oropharyngeal erythema or ulceration NECK: supple, no JVD LYMPH:  no palpable lymphadenopathy in the cervical, axillary or inguinal regions LUNGS: clear to auscultation b/l with normal respiratory effort HEART: regular rate & rhythm ABDOMEN:  normoactive bowel sounds , non tender, not distended. No palpable hepatosplenomegaly.  Extremity: no pedal edema PSYCH: alert & oriented x 3 with fluent speech NEURO: no focal motor/sensory deficits   LABORATORY DATA:  I have reviewed the data as listed  . CBC Latest Ref Rng & Units 11/29/2017 10/18/2017 10/04/2017  WBC 4.0 - 10.3 K/uL 5.2 5.4 6.2  Hemoglobin 13.0 - 17.1 g/dL 13.4 12.6(L) 12.8(L)  Hematocrit 38.4 - 49.9 % 39.5 37.7(L) 38.8  Platelets 140 - 400 K/uL 112(L) 113(L) 132(L)    CMP Latest Ref Rng & Units 11/29/2017 10/18/2017 10/04/2017  Glucose 70 - 99 mg/dL 124(H) 87 104  BUN 8 - 23 mg/dL 23 24(H) 32(H)  Creatinine 0.61 - 1.24 mg/dL 1.77(H) 1.77(H) 1.81(H)  Sodium 135 - 145 mmol/L 139 142 141  Potassium 3.5 - 5.1 mmol/L 3.4(L) 4.0 4.1  Chloride 98 - 111 mmol/L 102 109 106  CO2 22 - 32 mmol/L 26 24 26  Calcium 8.9 - 10.3 mg/dL 8.9 9.2 8.9  Total Protein 6.5 - 8.1 g/dL 6.9 6.3(L) 6.4  Total Bilirubin 0.3 - 1.2 mg/dL 0.9 0.6 0.6  Alkaline Phos 38  - 126 U/L 77 68 68  AST 15 - 41 U/L 20 27 25  ALT 0 - 44 U/L 16 20 19        RADIOGRAPHIC STUDIES: I have personally reviewed the radiological images as listed and agreed with the findings in the report. No results found. Result status: Edited Result - FINAL                              *[place]*                  *[facility]*                          [address]                          [place], [place] [address]                            [phone]  ------------------------------------------------------------------- Transthoracic Echocardiography  (Report amended )  Patient:    Alex Alex, Alex Alex Alex #:       [id number] Study Date: 08/24/2016 Gender:     M Age:        75 Height:     180.3 cm Weight:     90.6 kg BSA:        2.15 m^2 Pt. Status: Room:   ATTENDING    Chris Mcalhany, MD  ORDERING     McAlhany, Christopher  REFERRING    McAlhany, Christopher  PERFORMING   [facility], Outpatient  SONOGRAPHER  Tina [place]  cc:  ------------------------------------------------------------------- LV EF: 65% -   70%  ------------------------------------------------------------------- Indications:      Aortic stenosis 424.1.  ------------------------------------------------------------------- History:   PMH:  Amyloidosis.  Coronary artery disease.  Angina pectoris.  Risk factors:  Hypertension. Dyslipidemia.  ------------------------------------------------------------------- Study Conclusions  - Left ventricle: The cavity size was normal. Wall thickness was   increased in a pattern of mild LVH. Systolic function was   vigorous. The estimated ejection fraction was in the range of 65%   to 70%. Wall motion was normal; there were no regional wall   motion abnormalities. Doppler parameters are consistent with   abnormal left ventricular relaxation (grade 1 diastolic   dysfunction). - Aortic valve: Valve mobility was mildly restricted. -  Aortic root: The aortic root was mildly dilated. - Mitral valve: Calcified annulus.  Impressions:  - Vigorous LV systolic function; mild diastolic dysfunction; mild   LVH; calcified aortic valve with no significant AS (peak velocity   2 m/s; mean gradient 9 mmHg); mildly dilated aortic root.    ASSESSMENT & PLAN:   76 y.Alex.  male with above-mentioned multiple medical comorbidities with  #1 Biopsy proven Renal and Systemic AL Amyloidosis Initial BM Bx shows 9% plasma cells and amyloid deposits. Echo shows normal systolic function with only grade 1 diastolic dysfunction. Skeletal survey with no lytic lesions suggestive of multiple myeloma. Serum K/L ratio increased at about 6.22 on diagnosis and has now improved to 1.5 (wnl with normal kappa lambda serum free light chain ratio ) UPEP shows 24h protein improved from 15g to 5g daily and the last recent one is about 7.3 g /24h  #2  Nephrotic Syndrome - likely related to AL amyloidosis.  UPEP shows 24h protein improved from 15g to 5g daily and the last recent one is about 7.3 g /24h   Urine Study 07/26/17 showed Protein at 2.232g.  -07/19/17 the pt had a BM Bx exam which revealed Normocellular bone marrow (30-40%) with 2-3% plasma cells and focal amyloid deposition.  PLAN: -[place]arna OTC with Hydrocortisone for remaining itching -Colloidal silver soap, limit hot showers - Pt will take Dexamethasone 10mg, Zofran 4-8mg, Hydroxyzine- all 30 minutes before the Ninlaro pill.  -If pt develops a reaction to Ninlaro, we will return to [place] -Discussed pt labwork today, 11/29/17; blood counts and chemistries are stable -Begin using Hydrocortisone as previously recommended -Begin taking OTC Zyrtec or Claritin as previously recommended  -Will refill Hydroxyzine and Ninlaro -Will see pt back in one month   #4 Patient Active Problem List   Diagnosis Date Noted  . Folliculitis 10/25/2016  . AL amyloidosis ([facility]) 08/21/2016  . Umbilical hernia  11/10/2010  . DIZZINESS 02/19/2010  . CAD, NATIVE   VESSEL 07/21/2009  . CHEST PAIN-UNSPECIFIED 07/21/2009  . HYPERLIPIDEMIA 04/30/2007  . HYPERTENSION 04/30/2007  . GASTROESOPHAGEAL REFLUX DISEASE 04/30/2007  . SEIZURE DISORDER 04/30/2007  . COMPRESSION FRACTURE, L1 VERTEBRA 04/30/2007  . DIVERTICULOSIS, COLON, HX OF 04/30/2007  . Other postprocedural status(V45.89) 04/30/2007  -Continue follow-up with primary care physician for management of other medical co-morbidities. -Recommend if pt experiences spike fever to report to hospital with transplant team    RTC with Dr [name] in 4 weeks with labs     All of the patients and his wife's questions were answered to their apparent satisfaction. They are agreeable with the plan as noted above .The patient knows to call the clinic with any problems, questions or concerns.  The total time spent in the appt was 20 minutes and more than 50% was on counseling and direct patient cares.     Sullivan Lone MD MS AAHIVMS [facility] Hematology/Oncology Physician [facility]  (Office):       [phone] (Work cell):  [phone] (Fax):           [phone]  I, Baldwin [place], am acting as a scribe for Dr. [name]  .I have reviewed the above documentation for accuracy and completeness, and I agree with the above. Brunetta Genera MD

## 2017-11-29 NOTE — Telephone Encounter (Signed)
Gave patient avs and calendar of upcoming appts.  °

## 2017-11-30 ENCOUNTER — Telehealth: Payer: Self-pay

## 2017-11-30 LAB — KAPPA/LAMBDA LIGHT CHAINS
KAPPA FREE LGHT CHN: 16.3 mg/L (ref 3.3–19.4)
Kappa, lambda light chain ratio: 1.16 (ref 0.26–1.65)
Lambda free light chains: 14.1 mg/L (ref 5.7–26.3)

## 2017-11-30 NOTE — Telephone Encounter (Signed)
Patient signed records release and requested records faxed to Dr. Annetta Maw at the [facility]. Confirmation of fax received.

## 2017-12-01 LAB — MULTIPLE MYELOMA PANEL, SERUM
ALBUMIN/GLOB SERPL: 1.3 (ref 0.7–1.7)
ALPHA2 GLOB SERPL ELPH-MCNC: 1 g/dL (ref 0.4–1.0)
Albumin SerPl Elph-Mcnc: 3.5 g/dL (ref 2.9–4.4)
Alpha 1: 0.2 g/dL (ref 0.0–0.4)
B-Globulin SerPl Elph-Mcnc: 1 g/dL (ref 0.7–1.3)
Gamma Glob SerPl Elph-Mcnc: 0.6 g/dL (ref 0.4–1.8)
Globulin, Total: 2.8 g/dL (ref 2.2–3.9)
IGA: 55 mg/dL — AB (ref 61–437)
IGG (IMMUNOGLOBIN G), SERUM: 636 mg/dL — AB (ref 700–1600)
IGM (IMMUNOGLOBULIN M), SRM: 27 mg/dL (ref 15–143)
TOTAL PROTEIN ELP: 6.3 g/dL (ref 6.0–8.5)

## 2017-12-07 ENCOUNTER — Telehealth: Payer: Self-pay

## 2017-12-07 ENCOUNTER — Encounter: Payer: Self-pay | Admitting: Cardiovascular Disease

## 2017-12-07 NOTE — Telephone Encounter (Signed)
Oral Oncology Patient Advocate Encounter  [place] made me aware that the LLS grant was not covering full copay. There are no other funds available for the disease state.   I called [employer], [employer] and [employer]. I filled out an application for the patient to try to get copay assistance through [place], 1 point. Patient came in, met me in the lobby and signed the forms. This was declined because the patient has met all deductible and coverage limits.  I called LLS and his grant can be renewed 12-24-17. I will call that day to get his grant renewed if funds are available.  I spoke to the patient about this and he verbalized understanding and appreciation.  [place] Patient [name] Phone [phone] Fax [phone]

## 2017-12-08 ENCOUNTER — Telehealth: Payer: Self-pay

## 2017-12-08 ENCOUNTER — Other Ambulatory Visit: Payer: Self-pay | Admitting: Hematology

## 2017-12-08 DIAGNOSIS — E8581 Light chain (AL) amyloidosis: Secondary | ICD-10-CM

## 2017-12-08 MED ORDER — IXAZOMIB CITRATE 3 MG PO CAPS: ORAL_CAPSULE | ORAL | 2 refills | Status: DC

## 2017-12-08 NOTE — Telephone Encounter (Signed)
Oral Oncology Patient Advocate Encounter  Patient called about a letter he received from the [place] that he could get his Ninlaro through the [place]. Mr. Ernst Breach came into the office and we called the [place] together to get a better understanding of this and had to leave a message.  The [facility] approved his referral to an outside oncologist but they assigned him to [facility] and we are going to try to get that changed.  Will follow up here when I speak to the lady at the [place].   [place] Patient [place] Phone [phone] Fax [phone]

## 2017-12-08 NOTE — Telephone Encounter (Signed)
Oral Oncology Patient Advocate Encounter  The [place] returned my call, the referral has been switched to Dr. [name] @ [facility].   I gave a cover sheet and explained to Dr. Grier Mitts nurse today that a new Rx for Alex [name] needed to be faxed to Dr. Luvenia Starch at [phone] and Dr. Luvenia Starch would send it to the [facility] pharmacy.  The [facility] will then contact the patient to schedule a shipment.  I called Alex [name] and explained all of this, he will let me know when he hears from them.  He verbalized understanding and great appreciation.  [place] Patient [place] Phone [phone] Fax [phone]

## 2017-12-13 ENCOUNTER — Other Ambulatory Visit: Payer: Medicare HMO

## 2017-12-13 ENCOUNTER — Ambulatory Visit: Payer: Medicare HMO

## 2017-12-20 ENCOUNTER — Telehealth: Payer: Self-pay | Admitting: Hematology

## 2017-12-20 NOTE — Telephone Encounter (Signed)
Tried to reach regarding voicemail I did leave a message °

## 2017-12-26 NOTE — Telephone Encounter (Signed)
Oral Oncology Patient Advocate Encounter  Mr. Desanto called to follow up with [place] since he had not received a call or the medicine Kennieth Rad) from the [place] yet. I called the [place] and they just returned my call and said that outside prescriptions can be sent to [phone]. The lady I spoke to said that this will be a quicker way to get it to the pharmacy and it shouldn't have to go through the doctor as long as it goes through this [place].  That fax# is [phone].  Once they receive the prescription they will contact the patient.  [place] Patient [place] Phone [phone] Fax [phone]

## 2017-12-27 ENCOUNTER — Inpatient Hospital Stay: Payer: Medicare HMO | Attending: Hematology

## 2017-12-27 ENCOUNTER — Inpatient Hospital Stay (HOSPITAL_BASED_OUTPATIENT_CLINIC_OR_DEPARTMENT_OTHER): Payer: Medicare HMO | Admitting: Hematology

## 2017-12-27 ENCOUNTER — Telehealth: Payer: Self-pay | Admitting: Hematology

## 2017-12-27 VITALS — BP 111/78 | HR 76 | Temp 98.2°F | Resp 18 | Ht 71.0 in | Wt 210.0 lb

## 2017-12-27 DIAGNOSIS — G4733 Obstructive sleep apnea (adult) (pediatric): Secondary | ICD-10-CM | POA: Diagnosis not present

## 2017-12-27 DIAGNOSIS — Z85828 Personal history of other malignant neoplasm of skin: Secondary | ICD-10-CM | POA: Diagnosis not present

## 2017-12-27 DIAGNOSIS — G629 Polyneuropathy, unspecified: Secondary | ICD-10-CM | POA: Diagnosis not present

## 2017-12-27 DIAGNOSIS — I129 Hypertensive chronic kidney disease with stage 1 through stage 4 chronic kidney disease, or unspecified chronic kidney disease: Secondary | ICD-10-CM | POA: Insufficient documentation

## 2017-12-27 DIAGNOSIS — K219 Gastro-esophageal reflux disease without esophagitis: Secondary | ICD-10-CM | POA: Diagnosis not present

## 2017-12-27 DIAGNOSIS — E785 Hyperlipidemia, unspecified: Secondary | ICD-10-CM | POA: Insufficient documentation

## 2017-12-27 DIAGNOSIS — I25119 Atherosclerotic heart disease of native coronary artery with unspecified angina pectoris: Secondary | ICD-10-CM | POA: Diagnosis not present

## 2017-12-27 DIAGNOSIS — N189 Chronic kidney disease, unspecified: Secondary | ICD-10-CM | POA: Insufficient documentation

## 2017-12-27 DIAGNOSIS — E8581 Light chain (AL) amyloidosis: Secondary | ICD-10-CM

## 2017-12-27 DIAGNOSIS — M1990 Unspecified osteoarthritis, unspecified site: Secondary | ICD-10-CM | POA: Diagnosis not present

## 2017-12-27 DIAGNOSIS — Z87891 Personal history of nicotine dependence: Secondary | ICD-10-CM | POA: Insufficient documentation

## 2017-12-27 DIAGNOSIS — Z79899 Other long term (current) drug therapy: Secondary | ICD-10-CM | POA: Insufficient documentation

## 2017-12-27 DIAGNOSIS — I350 Nonrheumatic aortic (valve) stenosis: Secondary | ICD-10-CM | POA: Insufficient documentation

## 2017-12-27 DIAGNOSIS — N049 Nephrotic syndrome with unspecified morphologic changes: Secondary | ICD-10-CM | POA: Diagnosis not present

## 2017-12-27 DIAGNOSIS — K449 Diaphragmatic hernia without obstruction or gangrene: Secondary | ICD-10-CM | POA: Insufficient documentation

## 2017-12-27 LAB — CMP (CANCER CENTER ONLY)
ALT: 15 U/L (ref 0–44)
AST: 26 U/L (ref 15–41)
Albumin: 3.6 g/dL (ref 3.5–5.0)
Alkaline Phosphatase: 71 U/L (ref 38–126)
Anion gap: 8 (ref 5–15)
BUN: 25 mg/dL — AB (ref 8–23)
CHLORIDE: 104 mmol/L (ref 98–111)
CO2: 29 mmol/L (ref 22–32)
CREATININE: 1.64 mg/dL — AB (ref 0.61–1.24)
Calcium: 8.9 mg/dL (ref 8.9–10.3)
GFR, Est AFR Am: 46 mL/min — ABNORMAL LOW (ref >60)
GFR, Estimated: 40 mL/min — ABNORMAL LOW (ref >60)
Glucose, Bld: 140 mg/dL — ABNORMAL HIGH (ref 70–99)
Potassium: 3.8 mmol/L (ref 3.5–5.1)
SODIUM: 141 mmol/L (ref 135–145)
Total Bilirubin: 0.9 mg/dL (ref 0.3–1.2)
Total Protein: 6.5 g/dL (ref 6.5–8.1)

## 2017-12-27 LAB — CBC WITH DIFFERENTIAL/PLATELET
BASOS ABS: 0 10*3/uL (ref 0.0–0.1)
Basophils Relative: 1 %
Eosinophils Absolute: 0.2 10*3/uL (ref 0.0–0.5)
Eosinophils Relative: 2 %
HEMATOCRIT: 38.6 % (ref 38.4–49.9)
HEMOGLOBIN: 13 g/dL (ref 13.0–17.1)
LYMPHS PCT: 10 %
Lymphs Abs: 0.7 10*3/uL — ABNORMAL LOW (ref 0.9–3.3)
MCH: 30.8 pg (ref 27.2–33.4)
MCHC: 33.6 g/dL (ref 32.0–36.0)
MCV: 91.7 fL (ref 79.3–98.0)
Monocytes Absolute: 0.6 10*3/uL (ref 0.1–0.9)
Monocytes Relative: 9 %
NEUTROS ABS: 5.2 10*3/uL (ref 1.5–6.5)
NEUTROS PCT: 78 %
Platelets: 100 10*3/uL — ABNORMAL LOW (ref 140–400)
RBC: 4.21 MIL/uL (ref 4.20–5.82)
RDW: 14.5 % (ref 11.0–14.6)
WBC: 6.7 10*3/uL (ref 4.0–10.3)

## 2017-12-27 NOTE — Telephone Encounter (Signed)
Appts scheduled AVS/Calendar printed per 9/4 los °

## 2017-12-27 NOTE — Telephone Encounter (Signed)
Oral Oncology Patient Advocate Encounter  I faxed the prescription 12/27/17 @ 11:00. I wrote on the cover sheet that he will be starting his next cycle 9/16 (patient told me this) and will need it ASAP.  [place] Patient [place] Phone [phone] Fax [phone]

## 2017-12-27 NOTE — Progress Notes (Signed)
HEMATOLOGY/ONCOLOGY CLINIC NOTE  Date of Service: 12/27/17   Patient Care Team: Alex Redwood, MD as PCP - General (Internal Medicine) Brunetta Genera, MD as Consulting Physician (Hematology) Nephrology - Dr Alex Lips MD Dr Alex Pastor MD - GI  CHIEF COMPLAINTS/PURPOSE OF CONSULTATION:  AL Amyloidosis -with Nephrotic syndrome . No overt CHF pEF  HISTORY OF PRESENTING ILLNESS:   Alex [name]. is a wonderful 77 y.o. male who has been referred to [place] by Dr .Alex Redwood, MD /Dr Alex Lips MD for evaluation and management of newly diagnosed Kidney AL Amyloidosis with nephrotic syndrome.  Patient has a history of hypertension, dyslipidemia, coronary artery disease, seizure disorder on Dilantin who was apparently in his usual state of health until 3 months ago when he started developing new onset lower extremity edema. Patient had a UA at the time that showed 4+ protein in 24-hour collection revealed 15 g of protein. Additional workup showed a creatinine of 0.8 with an albumin of 2.3 and negative SPEP and UPEP. Apparently had an elevated K/L SFLC ratio of 5.19.  He was urgently referred by his primary care physician to nephrology for additional evaluation. Patient notes no bleeding issues. No nosebleeds. No periorbital bleeding. No abnormal skin rashes. Has had significant NSAID exposure and use to take a fair amount of naproxen for chronic low back pain but has cut down on this.  Due to lack of clear etiology for his nephrotic syndrome the patient underwent a kidney biopsy on 08/06/2015 accession [id number] which showed AL amyloidosis, lambda immunophenotype, [place] red positive. Noted to have severe arteriosclerosis with moderate tubular interstitial scarring.  Patient was referred to [place] for further evaluation and treatment of his AL Amyloidosis, Patient notes that his leg swelling has improved with diuretic therapy.  He notes no weight loss. No night sweats. No focal  bone pains. No skin rashes. Lungs normal bleeding or bruising.  Patient notes that he had a lot of reading online and he and his wife had an extensive list of questions which were answered in detail.  He notes he has had some chronic issues with upper abdominal pain and nausea and that he follows with Dr. Henrene [name] and is apparently being scheduled for an EGD and colonoscopy according to his report.  Denies having and lacks tongue. No chest pain and no overt new shortness of breath. Patient is uncertain if an echocardiogram has been ordered or scheduled.  INTERVAL HISTORY  Mr Alex [name] is here for a scheduled follow-up for his AL amyloidosis. The patient's last visit with [place] was on 11/29/17. He is accompanied today by his wife. The pt reports that he is doing well overall.   The pt reports that he has been itching less and notes that he has a couple bruises on his arm. The pt denies taking blood thinners. He continues taking his Acyclovir. He notes that his rashes have decreased and resolved, except for a small rash on his head. He has been using Sarna lotion which has helped resolve his itching substantially. The pt notes that he is tolerating Ninlaro much better.   The pt notes that he has continued to have some neuropathy in his feet and ankles, which he denies having worsened.   The pt notes that he has had some mild nausea and does not want anything for this.   Lab results today (12/27/17) of CBC w/diff, CMP, and Reticulocytes is as follows: all values are WNL except for PLT at 100k, Lymphs  abs at 700, Glucose at 140, BUN at 25, Creatinine at 1.64, GFR at 40. 12/27/17 MMP is pending 12/27/17 SFLC is pending  On review of systems, pt reports resolved itching, resolved skin rashes on extremities, clearing rash on head, light bruising on arms, stable neuropathy, mild nausea, and denies leg swelling, anxiety, abdominal pains, and any other symptoms.    MEDICAL HISTORY:  Past Medical History:    Diagnosis Date  . Amyloidosis ([place])   . Arthritis   . Basal cell carcinoma   . CAD (coronary artery disease)    Lexiscan Myoview 6/14: Normal study, no scar or ischemia, EF 62%  . Cataract   . Chronic kidney disease   . Colon polyp   . Coronary artery disease    moderate disease by cath 2011  . Diverticulosis of colon   . GERD (gastroesophageal reflux disease)   . Hearing loss   . Heart disease   . Hiatal hernia   . Hyperlipidemia   . Hypertension   . MRSA (methicillin resistant staph aureus) culture positive   . Seizure disorder ([place])   . Seizures ([place])    last in 1970s  Obstructive sleep apnea Gastroesophageal reflux disease Aortic stenosis Erectile dysfunction Peripheral neuropathy Seizure disorder   SURGICAL HISTORY: Past Surgical History:  Procedure Laterality Date  . BUNIONECTOMY    . CARPAL TUNNEL RELEASE  1994   left  . CERVICAL FUSION    . compression fracture    . FOOT SURGERY     right -twice, left foot once  . HERNIA REPAIR     Umbilical with PVP  . KNEE ARTHROSCOPY     left knee  . [place]   left - scope  . NASAL SEPTUM SURGERY    . NECK SURGERY    . NOSE SURGERY     twice  . PAROTIDECTOMY Right 03/09/2017  . ROTATOR CUFF REPAIR     twice, both shoulders  . ROTATOR CUFF REPAIR    . SHOULDER SURGERY    . SKIN BIOPSY    . SPINE SURGERY     C2, C3, C4  . TRIGGER FINGER RELEASE     both hands, twice  . TRIGGER FINGER RELEASE    . VASECTOMY      SOCIAL HISTORY: Social History   Socioeconomic History  . Marital status: Married    Spouse name: Not on file  . Number of children: 2  . Years of education: 53  . Highest education level: Not on file  Occupational History  . Occupation: Retired  Scientific laboratory technician  . Financial resource strain: Not on file  . Food insecurity:    Worry: Not on file    Inability: Not on file  . Transportation needs:    Medical: Not on file    Non-medical: Not on file  Tobacco Use  . Smoking  status: Former Smoker    Last attempt to quit: 04/25/1969    Years since quitting: 48.7  . Smokeless tobacco: Former Network engineer and Sexual Activity  . Alcohol use: Yes    Alcohol/week: 7.0 - 14.0 standard drinks    Types: 7 - 14 Glasses of wine per week    Comment: socially shots of wiskey  . Drug use: No  . Sexual activity: Not on file  Lifestyle  . Physical activity:    Days per week: Not on file    Minutes per session: Not on file  . Stress: Not on file  Relationships  . Social connections:    Talks on phone: Not on file    Gets together: Not on file    Attends religious service: Not on file    Active member of club or organization: Not on file    Attends meetings of clubs or organizations: Not on file    Relationship status: Not on file  . Intimate partner violence:    Fear of current or ex partner: Not on file    Emotionally abused: Not on file    Physically abused: Not on file    Forced sexual activity: Not on file  Other Topics Concern  . Not on file  Social History Narrative   ** Merged History Encounter **   Lives at home w/ his wife   Right-handed   Caffeine: occasional Pepsi      Patient is currently retired.   FAMILY HISTORY: Family History  Problem Relation Age of Onset  . Diabetes Father   . Heart disease Father   . Heart attack Father 43  . Hypertension Father   . Cancer Sister   . Hearing loss Mother   . Eczema Sister   . Hyperlipidemia Unknown   . Diabetes Unknown   . Hypertension Unknown   . Seizures Unknown   . Thyroid disease Unknown   . Colon cancer Neg Hx   . Stroke Neg Hx     ALLERGIES:  is allergic to ramipril.  MEDICATIONS:  Current Outpatient Medications  Medication Sig Dispense Refill  . acyclovir (ZOVIRAX) 800 MG tablet Take 800 mg by mouth 2 (two) times daily.    [name] Kitchen atorvastatin (LIPITOR) 40 MG tablet Take 40 mg by mouth at bedtime.     . cetirizine (ZYRTEC ALLERGY) 10 MG tablet Take 1 tablet (10 mg total) by mouth  daily. 30 tablet 1  . ezetimibe (ZETIA) 10 MG tablet Take 1 tablet by mouth daily.    . famotidine (PEPCID) 20 MG tablet Take 2 tablets (40 mg total) by mouth daily. 60 tablet 1  . folic acid (FOLVITE) 1 MG tablet Take 1 tablet by mouth daily.    . furosemide (LASIX) 40 MG tablet Take 40 mg by mouth 2 (two) times daily.    . hydrOXYzine (ATARAX/VISTARIL) 25 MG tablet Take 1 tablet (25 mg total) by mouth every 8 (eight) hours as needed for itching. 45 tablet 1  . isosorbide mononitrate (IMDUR) 30 MG 24 hr tablet Take 15 mg by mouth daily.     . ixazomib citrate (NINLARO) 3 MG capsule Take 1 capsule (58m) by mouth once weekly for 3 weeks on, 1 week off, repeat Q 4 weeks. Take on empty stomach 1hr before or 2hrs after food. 3 capsule 2  . losartan (COZAAR) 25 MG tablet Take 25 mg by mouth daily.    . Multiple Vitamin (MULTI-VITAMINS) TABS Take 1 tablet by mouth daily.    . nitroGLYCERIN (NITROSTAT) 0.4 MG SL tablet Place 1 tablet (0.4 mg total) under the tongue every 5 (five) minutes as needed for chest pain. 25 tablet 6  . omeprazole (PRILOSEC) 20 MG capsule Take 20 mg by mouth daily.      . ondansetron (ZOFRAN) 8 MG tablet Take 1 tablet (8 mg total) by mouth 2 (two) times daily as needed for refractory nausea / vomiting. Start on D 4 and 11. Do not take on D8. 30 tablet 1  . oxyCODONE (OXY IR/ROXICODONE) 5 MG immediate release tablet Take 1 tablet by mouth every 6 (  six) hours as needed for severe pain.    . potassium chloride SA (K-DUR,KLOR-CON) 20 MEQ tablet Take 20 mEq by mouth at bedtime.     . predniSONE (DELTASONE) 10 MG tablet 4 tab x 3 days, 3 tab x 3 days, 2 tab x 3 days, 1 tab x 3 day, stop 30 tablet 0  . prochlorperazine (COMPAZINE) 10 MG tablet Take 1 tablet (10 mg total) by mouth every 6 (six) hours as needed (Nausea or vomiting). 30 tablet 1  . triamcinolone cream (KENALOG) 0.1 % Apply 1 application topically as needed for rash.     No current facility-administered medications for this  visit.     REVIEW OF SYSTEMS:    A 10+ POINT REVIEW OF SYSTEMS WAS OBTAINED including neurology, dermatology, psychiatry, cardiac, respiratory, lymph, extremities, GI, GU, Musculoskeletal, constitutional, breasts, reproductive, HEENT.  All pertinent positives are noted in the HPI.  All others are negative.   PHYSICAL EXAMINATION: ECOG PERFORMANCE STATUS: 1 - Symptomatic but completely ambulatory  Vitals:   12/27/17 1505  BP: 111/78  Pulse: 76  Resp: 18  Temp: 98.2 F (36.8 C)  SpO2: 99%   Filed Weights   12/27/17 1505  Weight: 210 lb (95.3 kg)   .Body mass index is 29.29 kg/m.  GENERAL:alert, in no acute distress and comfortable SKIN: no acute rashes, no significant lesions EYES: conjunctiva are pink and non-injected, sclera anicteric OROPHARYNX: MMM, no exudates, no oropharyngeal erythema or ulceration NECK: supple, no JVD LYMPH:  no palpable lymphadenopathy in the cervical, axillary or inguinal regions LUNGS: clear to auscultation b/l with normal respiratory effort HEART: regular rate & rhythm ABDOMEN:  normoactive bowel sounds , non tender, not distended. No palpable hepatosplenomegaly.  Extremity: no pedal edema PSYCH: alert & oriented x 3 with fluent speech NEURO: no focal motor/sensory deficits   LABORATORY DATA:  I have reviewed the data as listed  . CBC Latest Ref Rng & Units 12/27/2017 11/29/2017 10/18/2017  WBC 4.0 - 10.3 K/uL 6.7 5.2 5.4  Hemoglobin 13.0 - 17.1 g/dL 13.0 13.4 12.6(L)  Hematocrit 38.4 - 49.9 % 38.6 39.5 37.7(L)  Platelets 140 - 400 K/uL 100(L) 112(L) 113(L)    CMP Latest Ref Rng & Units 12/27/2017 11/29/2017 10/18/2017  Glucose 70 - 99 mg/dL 140(H) 124(H) 87  BUN 8 - 23 mg/dL 25(H) 23 24(H)  Creatinine 0.61 - 1.24 mg/dL 1.64(H) 1.77(H) 1.77(H)  Sodium 135 - 145 mmol/L 141 139 142  Potassium 3.5 - 5.1 mmol/L 3.8 3.4(L) 4.0  Chloride 98 - 111 mmol/L 104 102 109  CO2 22 - 32 mmol/L _0 Calcium 8.9 - 10.3 mg/dL 8.9 8.9 9.2  Total Protein  6.5 - 8.1 g/dL 6.5 6.9 6.3(L)  Total Bilirubin 0.3 - 1.2 mg/dL 0.9 0.9 0.6  Alkaline Phos 38 - 126 U/L 71 77 68  AST 15 - 41 U/L _1 ALT 0 - 44 U/L _2 RADIOGRAPHIC STUDIES: I have personally reviewed the radiological images as listed and agreed with the findings in the report. No results found. Result status: Edited Result - FINAL                              *[facility]*  [address]                        [place], [place] [address]                            [phone]  ------------------------------------------------------------------- Transthoracic Echocardiography  (Report amended )  Patient:    Harinder, Romas MR #:       [id number] Study Date: 08/24/2016 Gender:     M Age:        54 Height:     180.3 cm Weight:     90.6 kg BSA:        2.15 m^2 Pt. Status: Room:   ATTENDING    Darlina Guys, MD  Willia Craze, Christopher  REFERRING    [place], [name], Outpatient  SONOGRAPHER  Roseanna Rainbow  cc:  ------------------------------------------------------------------- LV EF: 65% -   70%  ------------------------------------------------------------------- Indications:      Aortic stenosis 424.1.  ------------------------------------------------------------------- History:   PMH:  Amyloidosis.  Coronary artery disease.  Angina pectoris.  Risk factors:  Hypertension. Dyslipidemia.  ------------------------------------------------------------------- Study Conclusions  - Left ventricle: The cavity size was normal. Wall thickness was   increased in a pattern of mild LVH. Systolic function was   vigorous. The estimated ejection fraction was in the range of 65%   to 70%. Wall motion was normal; there were no regional wall   motion abnormalities. Doppler parameters are consistent with   abnormal left ventricular relaxation  (grade 1 diastolic   dysfunction). - Aortic valve: Valve mobility was mildly restricted. - Aortic root: The aortic root was mildly dilated. - Mitral valve: Calcified annulus.  Impressions:  - Vigorous LV systolic function; mild diastolic dysfunction; mild   LVH; calcified aortic valve with no significant AS (peak velocity   2 m/s; mean gradient 9 mmHg); mildly dilated aortic root.    ASSESSMENT & PLAN:   77 y.o.  male with above-mentioned multiple medical comorbidities with  #1 Biopsy proven Renal and Systemic AL Amyloidosis Initial BM Bx shows 9% plasma cells and amyloid deposits. Echo shows normal systolic function with only grade 1 diastolic dysfunction. Skeletal survey with no lytic lesions suggestive of multiple myeloma. Serum K/L ratio increased at about 6.22 on diagnosis and has now improved to 1.5 (wnl with normal kappa lambda serum free light chain ratio ) UPEP shows 24h protein improved from 15g to 5g daily and the last recent one is about 7.3 g /24h  #2  Nephrotic Syndrome - likely related to AL amyloidosis.  Urine Study 07/26/17 showed Protein at 2.232g.  -07/19/17 the pt had a BM Bx exam which revealed Normocellular bone marrow (30-40%) with 2-3% plasma cells and focal amyloid deposition.  PLAN:  -Discussed pt labwork today, 12/27/17; blood counts and chemistries are impoved -Pt remains in remission  -Will refer pt to our financial department for questions about insurance -The pt has no prohibitive toxicities from continuing maintenance Ninlaro at this time.   -Continue Vitamin B supplement -Will see the pt back in 4 weeks  -Sarna OTC with Hydrocortisone for itching as needed - Pt will take Dexamethasone 9m, Zofran 4-814m Hydroxyzine- all 30 minutes before the Ninlaro pill.  -If pt develops a reaction to Ninlaro, we will return to Velcade - tolerating at this time. -Begin using Hydrocortisone as previously recommended -Begin taking OTC Zyrtec or Claritin as  previously recommended  #4 Patient Active Problem List   Diagnosis Date Noted  . Folliculitis 84/83/5075  . AL amyloidosis ([place]) 08/21/2016  . Umbilical hernia 73/22/5672  . DIZZINESS 02/19/2010  . CAD, NATIVE VESSEL 07/21/2009  . CHEST PAIN-UNSPECIFIED 07/21/2009  . HYPERLIPIDEMIA 04/30/2007  . HYPERTENSION 04/30/2007  . GASTROESOPHAGEAL REFLUX DISEASE 04/30/2007  . SEIZURE DISORDER 04/30/2007  . COMPRESSION FRACTURE, L1 VERTEBRA 04/30/2007  . DIVERTICULOSIS, COLON, HX OF 04/30/2007  . Other postprocedural status(V45.89) 04/30/2007  -Continue follow-up with primary care physician for management of other medical co-morbidities. -Recommend if pt experiences spike fever to report to hospital with transplant team    RTC with Dr [name] in 4 weeks with labs    All of the patients and his wife's questions were answered to their apparent satisfaction. They are agreeable with the plan as noted above .The patient knows to call the clinic with any problems, questions or concerns.  The total time spent in the appt was 20 minutes and more than 50% was on counseling and direct patient cares.      Sullivan Lone MD MS AAHIVMS [facility] Hematology/Oncology Physician [facility]  (Office):       [phone] (Work cell):  [phone] (Fax):           [phone]  I, Baldwin [place], am acting as a scribe for Dr. [name]  .I have reviewed the above documentation for accuracy and completeness, and I agree with the above. Brunetta Genera MD

## 2017-12-28 ENCOUNTER — Telehealth: Payer: Self-pay

## 2017-12-28 LAB — MULTIPLE MYELOMA PANEL, SERUM
ALBUMIN/GLOB SERPL: 1.5 (ref 0.7–1.7)
Albumin SerPl Elph-Mcnc: 3.5 g/dL (ref 2.9–4.4)
Alpha 1: 0.2 g/dL (ref 0.0–0.4)
Alpha2 Glob SerPl Elph-Mcnc: 0.9 g/dL (ref 0.4–1.0)
B-Globulin SerPl Elph-Mcnc: 0.9 g/dL (ref 0.7–1.3)
GAMMA GLOB SERPL ELPH-MCNC: 0.5 g/dL (ref 0.4–1.8)
GLOBULIN, TOTAL: 2.5 g/dL (ref 2.2–3.9)
IGA: 66 mg/dL (ref 61–437)
IgG (Immunoglobin G), Serum: 635 mg/dL — ABNORMAL LOW (ref 700–1600)
IgM (Immunoglobulin M), Srm: 24 mg/dL (ref 15–143)
Total Protein ELP: 6 g/dL (ref 6.0–8.5)

## 2017-12-28 LAB — KAPPA/LAMBDA LIGHT CHAINS
KAPPA FREE LGHT CHN: 16.4 mg/L (ref 3.3–19.4)
Kappa, lambda light chain ratio: 1.2 (ref 0.26–1.65)
Lambda free light chains: 13.7 mg/L (ref 5.7–26.3)

## 2017-12-28 NOTE — Progress Notes (Signed)
Chief Complaint  Patient presents with  . Follow-up    CAD   History of Present Illness: 77 yo male with history of amyloidosis with nephrotic syndrome, HTN, moderate non-obstructive CAD by cath 2011, GERD, hyperlipidemia and carotid artery disease who is here today for follow up. He had a workup 2011 for dizziness including carotid artery dopplers (mild bilateral disease, <39%) and normal echo. HCTZ and Coreg were stopped which helped his dizziness. Repeat carotid artery dopplers 04/05/12 with mild bilateral disease. Stress myoview July 2016 with no ischemia. Echo May 2018 with July 2016 with normal LV function, mild AS (mean gradient 9 mmHg). He was seen in our office March 2017 by Truitt Merle, NP and at that time noted occasional chest pain. Imdur was added. His chest pain improved. He was diagnosed with amyloidosis in April 2018 treated with chemotherapy for 8 months, then stem cell transplant December 2018 and now doing chemotherapy for maintenance.   He is here today for follow up. The patient denies any dyspnea, palpitations, lower extremity edema, orthopnea, PND, dizziness, near syncope or syncope. He has rare chest pains. He has fatigue.   Primary Care Physician: [name], MD  Past Medical History:  Diagnosis Date  . Amyloidosis ([place])   . Arthritis   . Basal cell carcinoma   . CAD (coronary artery disease)    Lexiscan Myoview 6/14: Normal study, no scar or ischemia, EF 62%  . Cataract   . Chronic kidney disease   . Colon polyp   . Coronary artery disease    moderate disease by cath 2011  . Diverticulosis of colon   . GERD (gastroesophageal reflux disease)   . Hearing loss   . Heart disease   . Hiatal hernia   . Hyperlipidemia   . Hypertension   . MRSA (methicillin resistant staph aureus) culture positive   . Seizure disorder ([place])   . Seizures ([place])    last in 1970s    Past Surgical History:  Procedure Laterality Date  . BUNIONECTOMY    . CARPAL TUNNEL  RELEASE  1994   left  . CERVICAL FUSION    . compression fracture    . FOOT SURGERY     right -twice, left foot once  . HERNIA REPAIR     Umbilical with PVP  . KNEE ARTHROSCOPY     left knee  . [place]   left - scope  . NASAL SEPTUM SURGERY    . NECK SURGERY    . NOSE SURGERY     twice  . PAROTIDECTOMY Right 03/09/2017  . ROTATOR CUFF REPAIR     twice, both shoulders  . ROTATOR CUFF REPAIR    . SHOULDER SURGERY    . SKIN BIOPSY    . SPINE SURGERY     C2, C3, C4  . TRIGGER FINGER RELEASE     both hands, twice  . TRIGGER FINGER RELEASE    . VASECTOMY      Current Outpatient Medications  Medication Sig Dispense Refill  . acyclovir (ZOVIRAX) 800 MG tablet Take 800 mg by mouth 2 (two) times daily.    [name] Kitchen atorvastatin (LIPITOR) 40 MG tablet Take 40 mg by mouth at bedtime.     . cetirizine (ZYRTEC ALLERGY) 10 MG tablet Take 1 tablet (10 mg total) by mouth daily. 30 tablet 1  . ezetimibe (ZETIA) 10 MG tablet Take 1 tablet by mouth daily.    . famotidine (PEPCID) 20 MG tablet Take  2 tablets (40 mg total) by mouth daily. 60 tablet 1  . folic acid (FOLVITE) 1 MG tablet Take 1 tablet by mouth daily.    . hydrOXYzine (ATARAX/VISTARIL) 25 MG tablet Take 1 tablet (25 mg total) by mouth every 8 (eight) hours as needed for itching. 45 tablet 1  . isosorbide mononitrate (IMDUR) 30 MG 24 hr tablet Take 15 mg by mouth daily.     . ixazomib citrate (NINLARO) 3 MG capsule Take 1 capsule (3mg ) by mouth once weekly for 3 weeks on, 1 week off, repeat Q 4 weeks. Take on empty stomach 1hr before or 2hrs after food. 3 capsule 2  . losartan (COZAAR) 25 MG tablet Take 25 mg by mouth daily.    . Multiple Vitamin (MULTI-VITAMINS) TABS Take 1 tablet by mouth daily.    . nitroGLYCERIN (NITROSTAT) 0.4 MG SL tablet Place 0.4 mg under the tongue every 5 (five) minutes as needed for chest pain.    [name] Kitchen omeprazole (PRILOSEC) 20 MG capsule Take 20 mg by mouth daily.      . ondansetron (ZOFRAN) 8 MG tablet  Take 8 mg by mouth 2 (two) times daily as needed for nausea or vomiting.    [name] Kitchen oxyCODONE (OXY IR/ROXICODONE) 5 MG immediate release tablet Take 1 tablet by mouth every 6 (six) hours as needed for severe pain.    . potassium chloride SA (K-DUR,KLOR-CON) 20 MEQ tablet Take 20 mEq by mouth at bedtime.     . prochlorperazine (COMPAZINE) 10 MG tablet Take 10 mg by mouth every 6 (six) hours as needed for nausea or vomiting.    . triamcinolone cream (KENALOG) 0.1 % Apply 1 application topically as needed for rash.     No current facility-administered medications for this visit.     Allergies  Allergen Reactions  . Ramipril Cough    Social History   Socioeconomic History  . Marital status: Married    Spouse name: Not on file  . Number of children: 2  . Years of education: 57  . Highest education level: Not on file  Occupational History  . Occupation: Retired  Scientific laboratory technician  . Financial resource strain: Not on file  . Food insecurity:    Worry: Not on file    Inability: Not on file  . Transportation needs:    Medical: Not on file    Non-medical: Not on file  Tobacco Use  . Smoking status: Former Smoker    Last attempt to quit: 04/25/1969    Years since quitting: 48.7  . Smokeless tobacco: Former Network engineer and Sexual Activity  . Alcohol use: Yes    Alcohol/week: 7.0 - 14.0 standard drinks    Types: 7 - 14 Glasses of wine per week    Comment: socially shots of wiskey  . Drug use: No  . Sexual activity: Not on file  Lifestyle  . Physical activity:    Days per week: Not on file    Minutes per session: Not on file  . Stress: Not on file  Relationships  . Social connections:    Talks on phone: Not on file    Gets together: Not on file    Attends religious service: Not on file    Active member of club or organization: Not on file    Attends meetings of clubs or organizations: Not on file    Relationship status: Not on file  . Intimate partner violence:    Fear of current  or ex  partner: Not on file    Emotionally abused: Not on file    Physically abused: Not on file    Forced sexual activity: Not on file  Other Topics Concern  . Not on file  Social History Narrative   ** Merged History Encounter **   Lives at home w/ his wife   Right-handed   Caffeine: occasional Pepsi        Family History  Problem Relation Age of Onset  . Diabetes Father   . Heart disease Father   . Heart attack Father 82  . Hypertension Father   . Cancer Sister   . Hearing loss Mother   . Eczema Sister   . Hyperlipidemia Unknown   . Diabetes Unknown   . Hypertension Unknown   . Seizures Unknown   . Thyroid disease Unknown   . Colon cancer Neg Hx   . Stroke Neg Hx     Review of Systems:  As stated in the HPI and otherwise negative.   BP 110/70   Pulse 77   Ht 5\' 11"  (1.803 m)   Wt 210 lb 12.8 oz (95.6 kg)   BMI 29.40 kg/m   Physical Examination:  General: Well developed, well nourished, NAD  HEENT: OP clear, mucus membranes moist  SKIN: warm, dry. No rashes. Neuro: No focal deficits  Musculoskeletal: Muscle strength 5/5 all ext  Psychiatric: Mood and affect normal  Neck: No JVD, no carotid bruits, no thyromegaly, no lymphadenopathy.  Lungs:Clear bilaterally, no wheezes, rhonci, crackles Cardiovascular: Regular rate and rhythm. No murmurs, gallops or rubs. Abdomen:Soft. Bowel sounds present. Non-tender.  Extremities: No lower extremity edema. Pulses are 2 + in the bilateral DP/PT.  Echo May 2018: Left ventricle: The cavity size was normal. Wall thickness was   increased in a pattern of mild LVH. Systolic function was   vigorous. The estimated ejection fraction was in the range of 65%   to 70%. Wall motion was normal; there were no regional wall   motion abnormalities. Doppler parameters are consistent with   abnormal left ventricular relaxation (grade 1 diastolic   dysfunction). - Aortic valve: Valve mobility was mildly restricted. - Aortic root: The  aortic root was mildly dilated. - Mitral valve: Calcified annulus.  Impressions:  - Vigorous LV systolic function; mild diastolic dysfunction; mild   LVH; calcified aortic valve with no significant AS (peak velocity   2 m/s; mean gradient 9 mmHg); mildly dilated aortic root.  ------------------------------------------------------------------- Study data:  The previous study was not available, so comparison was made to the report of 11/14/2014.  Study status:  Routine. Procedure:  Patient has very high windows. Transthoracic echocardiography. Image quality was poor. The study was technically difficult, as a result of poor acoustic windows.  Study completion:  There were no complications.          Transthoracic echocardiography.  M-mode, complete 2D, spectral Doppler, and color Doppler.  Birthdate:  Patient birthdate: 1940-08-09.  Age:  Patient is 77 yr old.  Sex:  Gender: male.    BMI: 27.9 kg/m^2.  Blood pressure:     129/70  Patient status:  Outpatient.  Study date: Study date: 08/24/2016. Study time: 11:01 AM.  Location:  [facility] laboratory.  -------------------------------------------------------------------  ------------------------------------------------------------------- Left ventricle:  The cavity size was normal. Wall thickness was increased in a pattern of mild LVH. Systolic function was vigorous. The estimated ejection fraction was in the range of 65% to 70%. Wall motion was normal; there were no regional wall motion  abnormalities. Doppler parameters are consistent with abnormal left ventricular relaxation (grade 1 diastolic dysfunction).  ------------------------------------------------------------------- Aortic valve:   Trileaflet; mildly calcified leaflets. Valve mobility was mildly restricted.  Doppler:   There was no stenosis.  There was no regurgitation.    VTI ratio of LVOT to aortic valve: 0.48. Indexed valve area (VTI): 0.85 cm^2/m^2. Peak velocity  ratio of LVOT to aortic valve: 0.39. Indexed valve area (Vmax): 0.7 cm^2/m^2. Mean velocity ratio of LVOT to aortic valve: 0.4. Indexed valve area (Vmean): 0.7 cm^2/m^2.    Mean gradient (S): 9 mm Hg. Peak gradient (S): 16 mm Hg.  ------------------------------------------------------------------- Aorta:  Aortic root: The aortic root was mildly dilated.  ------------------------------------------------------------------- Mitral valve:   Calcified annulus. Mobility was not restricted. Doppler:  Transvalvular velocity was within the normal range. There was no evidence for stenosis. There was no regurgitation.  ------------------------------------------------------------------- Left atrium:  The atrium was normal in size.  ------------------------------------------------------------------- Right ventricle:  The cavity size was normal. Systolic function was normal.  ------------------------------------------------------------------- Pulmonic valve:    Doppler:  Transvalvular velocity was within the normal range. There was no evidence for stenosis. There was trivial regurgitation.  ------------------------------------------------------------------- Tricuspid valve:   Structurally normal valve.    Doppler: Transvalvular velocity was within the normal range. There was trivial regurgitation.  ------------------------------------------------------------------- Right atrium:  The atrium was normal in size.  ------------------------------------------------------------------- Pericardium:  There was no pericardial effusion.  ------------------------------------------------------------------- Systemic veins: Inferior vena cava: The vessel was normal in size.  ------------------------------------------------------------------- Measurements   Left ventricle                           Value          Reference  LV ID, ED, PLAX chordal           (L)    33.1  mm       43 - 52  LV  ID, ES, PLAX chordal           (L)    20.3  mm       23 - 38  LV fx shortening, PLAX chordal           39    %        >=29  LV PW thickness, ED                      13.32 mm       ----------  IVS/LV PW ratio, ED                      0.98           <=1.3  Stroke volume, 2D                        72    ml       ----------  Stroke volume/bsa, 2D                    34    ml/m^2   ----------  LV e&', lateral                           8.59  cm/s     ----------  LV E/e&', lateral  7.35           ----------  LV s&', lateral                           11.1  cm/s     ----------  LV e&', medial                            6.64  cm/s     ----------  LV E/e&', medial                          9.5            ----------  LV e&', average                           7.62  cm/s     ----------  LV E/e&', average                         8.29           ----------  Longitudinal strain, TDI                 16    %        ----------    Ventricular septum                       Value          Reference  IVS thickness, ED                        13.07 mm       ----------    LVOT                                     Value          Reference  LVOT ID, S                               22    mm       ----------  LVOT area                                3.8   cm^2     ----------  LVOT peak velocity, S                    79.1  cm/s     ----------  LVOT mean velocity, S                    52.9  cm/s     ----------  LVOT VTI, S                              19    cm       ----------    Aortic valve                             Value  Reference  Aortic valve peak velocity, S            201   cm/s     ----------  Aortic valve mean velocity, S            133   cm/s     ----------  Aortic valve VTI, S                      39.5  cm       ----------  Aortic mean gradient, S                  9     mm Hg    ----------  Aortic peak gradient, S                  16    mm Hg    ----------  VTI ratio, LVOT/AV                        0.48           ----------  Aortic valve area/bsa, VTI               0.85  cm^2/m^2 ----------  Velocity ratio, peak, LVOT/AV            0.39           ----------  Aortic valve area/bsa, peak              0.7   cm^2/m^2 ----------  velocity  Velocity ratio, mean, LVOT/AV            0.4            ----------  Aortic valve area/bsa, mean              0.7   cm^2/m^2 ----------  velocity    Aorta                                    Value          Reference  Aortic root ID, ED                       39    mm       ----------  Ascending aorta ID, A-P, S               34    mm       ----------    Left atrium                              Value          Reference  LA ID, A-P, ES                           31    mm       ----------  LA ID/bsa, A-P                           1.44  cm/m^2   <=2.2  LA volume, S  37.7  ml       ----------  LA volume/bsa, S                         17.6  ml/m^2   ----------  LA volume, ES, 1-p A4C                   21.6  ml       ----------  LA volume/bsa, ES, 1-p A4C               10.1  ml/m^2   ----------  LA volume, ES, 1-p A2C                   67.2  ml       ----------  LA volume/bsa, ES, 1-p A2C               31.3  ml/m^2   ----------    Mitral valve                             Value          Reference  Mitral E-wave peak velocity              63.1  cm/s     ----------  Mitral A-wave peak velocity              80    cm/s     ----------  Mitral deceleration time          (H)    296   ms       150 - 230  Mitral E/A ratio, peak                   0.8            ----------    Right atrium                             Value          Reference  RA ID, S-I, ES, A4C                      45.3  mm       34 - 49  RA area, ES, A4C                         13.8  cm^2     8.3 - 19.5  RA volume, ES, A/L                       32    ml       ----------  RA volume/bsa, ES, A/L                   14.9  ml/m^2   ----------    Systemic veins                            Value          Reference  Estimated CVP                            3     mm Hg    ----------  Right ventricle                          Value          Reference  TAPSE                                    18.4  mm       ----------  RV s&', lateral, S                        10.8  cm/s     ----------  EKG:  EKG is ordered today. The ekg ordered today demonstrates NSR, rate 77 bpm. Inferior Q waves unchanged. Poor R wave progression unchanged.   Recent Labs: 05/01/2017: Magnesium 2.1 12/27/2017: ALT 15; BUN 25; Creatinine 1.64; Hemoglobin 13.0; Platelets 100; Potassium 3.8; Sodium 141   Lipid Panel Followed in primary care   Wt Readings from Last 3 Encounters:  12/29/17 210 lb 12.8 oz (95.6 kg)  12/27/17 210 lb (95.3 kg)  11/29/17 206 lb 1.6 oz (93.5 kg)     Other studies Reviewed: Additional studies/ records that were reviewed today include: . Review of the above records demonstrates:    Assessment and Plan:   1. CAD with stable angina: He has chronic stable angina. No recent changes in his chest pain. Moderate CAD by cath 2011. Stress myoview July 2016 without ischemia.  Continue ASA, statin, Imdur.    2. HYPERTENSION: BP is controlled. No changes.       3. HYPERLIPIDEMIA: Lipids followed in primary care. Continue statin.   4. Aortic stenosis: Mild by echo May 2018. Repeat echo now.   5. Amyloidosis: He is being followed in oncology. He has had chemotherapy and a stem cell transplant. Echo now to confirm no changes in LV size or function.   Current medicines are reviewed at length with the patient today.  The patient does not have concerns regarding medicines.  The following changes have been made:  no change  Labs/ tests ordered today include:   Orders Placed This Encounter  Procedures  . EKG 12-Lead  . ECHOCARDIOGRAM COMPLETE    Disposition:   FU with me in 12 months.   Signed, [name], MD 12/29/2017 11:04 AM    [facility] [place], [place],   [address] Phone: [phone]; Fax: [phone]

## 2017-12-28 NOTE — Telephone Encounter (Signed)
Pt called this morning requesting potential appt at [facility] instead of [facility] to receive next round of vaccines post-transplant. List faxed from [place] at [facility]. In-basket sent to [place], [facility] and Lanelle Bal, RN to review availability of vaccines and appt for pt. Discussed with pt who said that the [place] may be able to give them. Pt to p/u copy of required vaccines to show at [facility]. Pt to keep copies of vaccines received to coordinate with [facility] if given at the [place]. Correct medication and dose required to comply with post-transplant care as required by [facility]. Pt verbalized understanding.

## 2017-12-29 ENCOUNTER — Ambulatory Visit: Payer: Medicare HMO | Admitting: Cardiovascular Disease

## 2017-12-29 ENCOUNTER — Encounter: Payer: Self-pay | Admitting: Cardiovascular Disease

## 2017-12-29 VITALS — BP 110/70 | HR 77 | Ht 71.0 in | Wt 210.8 lb

## 2017-12-29 DIAGNOSIS — I10 Essential (primary) hypertension: Secondary | ICD-10-CM

## 2017-12-29 DIAGNOSIS — I350 Nonrheumatic aortic (valve) stenosis: Secondary | ICD-10-CM

## 2017-12-29 DIAGNOSIS — I25118 Atherosclerotic heart disease of native coronary artery with other forms of angina pectoris: Secondary | ICD-10-CM | POA: Diagnosis not present

## 2017-12-29 DIAGNOSIS — E7800 Pure hypercholesterolemia, unspecified: Secondary | ICD-10-CM | POA: Diagnosis not present

## 2017-12-29 DIAGNOSIS — E8581 Light chain (AL) amyloidosis: Secondary | ICD-10-CM | POA: Diagnosis not present

## 2017-12-29 NOTE — Patient Instructions (Addendum)
Medication Instructions:  Your physician recommends that you continue on your current medications as directed. Please refer to the Current Medication list given to you today.   Labwork: none  Testing/Procedures: Your physician has requested that you have an echocardiogram. Echocardiography is a painless test that uses sound waves to create images of your heart. It provides your doctor with information about the size and shape of your heart and how well your heart's chambers and valves are working. This procedure takes approximately one hour. There are no restrictions for this procedure.    Follow-Up: Your physician recommends that you schedule a follow-up appointment in: 6 months. Please call our office in about 2 months to schedule this appointment   Any Other Special Instructions Will Be Listed Below (If Applicable).     If you need a refill on your cardiac medications before your next appointment, please call your pharmacy.

## 2017-12-31 ENCOUNTER — Other Ambulatory Visit: Payer: Self-pay | Admitting: Medical

## 2017-12-31 DIAGNOSIS — L509 Urticaria, unspecified: Secondary | ICD-10-CM

## 2018-01-02 ENCOUNTER — Other Ambulatory Visit (HOSPITAL_COMMUNITY): Payer: Medicare HMO

## 2018-01-03 NOTE — Telephone Encounter (Signed)
Oral Oncology Patient Advocate Encounter  I spoke to [place] [facility]) at the [facility] 01/02/18. He had me fax the prescription again and he was going to talk to the oncology [place] and call me back.  He called back and let me know that filling the [place] would not be a problem and [name] would receive the medication on Friday 01/05/18.  I called [name] to inform him of this and he verbalized understanding and great appreciation.   [place] [facility] [place] Phone [phone] Fax [phone]

## 2018-01-10 DIAGNOSIS — Z23 Encounter for immunization: Secondary | ICD-10-CM | POA: Diagnosis not present

## 2018-01-10 DIAGNOSIS — Z9484 Stem cells transplant status: Secondary | ICD-10-CM | POA: Diagnosis not present

## 2018-01-10 DIAGNOSIS — E559 Vitamin D deficiency, unspecified: Secondary | ICD-10-CM | POA: Diagnosis not present

## 2018-01-10 DIAGNOSIS — Z48298 Encounter for aftercare following other organ transplant: Secondary | ICD-10-CM | POA: Diagnosis not present

## 2018-01-10 DIAGNOSIS — E8581 Light chain (AL) amyloidosis: Secondary | ICD-10-CM | POA: Diagnosis not present

## 2018-01-10 DIAGNOSIS — R635 Abnormal weight gain: Secondary | ICD-10-CM | POA: Diagnosis not present

## 2018-01-11 NOTE — Telephone Encounter (Signed)
Oral Oncology Patient Advocate Encounter  Confirmed with the patient that his medicine was delivered to him 01/04/18.  [facility] [place] Phone [phone] Fax [phone]

## 2018-01-12 ENCOUNTER — Ambulatory Visit (HOSPITAL_COMMUNITY): Payer: Medicare HMO | Attending: Cardiology

## 2018-01-12 ENCOUNTER — Other Ambulatory Visit: Payer: Self-pay

## 2018-01-12 DIAGNOSIS — I10 Essential (primary) hypertension: Secondary | ICD-10-CM | POA: Diagnosis not present

## 2018-01-12 DIAGNOSIS — I350 Nonrheumatic aortic (valve) stenosis: Secondary | ICD-10-CM

## 2018-01-12 DIAGNOSIS — E785 Hyperlipidemia, unspecified: Secondary | ICD-10-CM | POA: Insufficient documentation

## 2018-01-12 DIAGNOSIS — I25118 Atherosclerotic heart disease of native coronary artery with other forms of angina pectoris: Secondary | ICD-10-CM | POA: Diagnosis not present

## 2018-01-12 DIAGNOSIS — E8581 Light chain (AL) amyloidosis: Secondary | ICD-10-CM

## 2018-01-12 DIAGNOSIS — I082 Rheumatic disorders of both aortic and tricuspid valves: Secondary | ICD-10-CM | POA: Diagnosis not present

## 2018-01-23 NOTE — Progress Notes (Signed)
HEMATOLOGY/ONCOLOGY CLINIC NOTE  Date of Service: 01/24/18   Patient Care Team: [name], MD as PCP - General (Internal Medicine) Burnell Blanks, MD as PCP - Cardiology (Cardiology) Brunetta Genera, MD as Consulting Physician (Hematology) Nephrology - Dr Madelon Lips MD Dr Henrene Pastor MD - GI  CHIEF COMPLAINTS/PURPOSE OF CONSULTATION:  AL Amyloidosis -with Nephrotic syndrome . No overt CHF pEF  HISTORY OF PRESENTING ILLNESS:   Alex [name]. is a wonderful 77 y.o. male who has been referred to [place] by Dr .[name], MD /Dr Madelon Lips MD for evaluation and management of newly diagnosed Kidney AL Amyloidosis with nephrotic syndrome.  Patient has a history of hypertension, dyslipidemia, coronary artery disease, seizure disorder on Dilantin who was apparently in his usual state of health until 3 months ago when he started developing new onset lower extremity edema. Patient had a UA at the time that showed 4+ protein in 24-hour collection revealed 15 g of protein. Additional workup showed a creatinine of 0.8 with an albumin of 2.3 and negative SPEP and UPEP. Apparently had an elevated K/L SFLC ratio of 5.19.  He was urgently referred by his primary care physician to nephrology for additional evaluation. Patient notes no bleeding issues. No nosebleeds. No periorbital bleeding. No abnormal skin rashes. Has had significant NSAID exposure and use to take a fair amount of naproxen for chronic low back pain but has cut down on this.  Due to lack of clear etiology for his nephrotic syndrome the patient underwent a kidney biopsy on 08/06/2015 accession [id number] which showed AL amyloidosis, lambda immunophenotype, [place] red positive. Noted to have severe arteriosclerosis with moderate tubular interstitial scarring.  Patient was referred to [place] for further evaluation and treatment of his AL Amyloidosis, Patient notes that his leg swelling has improved with diuretic  therapy.  He notes no weight loss. No night sweats. No focal bone pains. No skin rashes. Lungs normal bleeding or bruising.  Patient notes that he had a lot of reading online and he and his wife had an extensive list of questions which were answered in detail.  He notes he has had some chronic issues with upper abdominal pain and nausea and that he follows with Dr. Henrene Pastor and is apparently being scheduled for an EGD and colonoscopy according to his report.  Denies having and lacks tongue. No chest pain and no overt new shortness of breath. Patient is uncertain if an echocardiogram has been ordered or scheduled.  INTERVAL HISTORY  Alex [name] is here for a scheduled follow-up for his AL amyloidosis. The patient's last visit with [place] was on 12/27/17. He is accompanied today by his wife. The pt reports that he is doing well overall.   The pt reports that he has not developed any significant concerns in the interim, and he is currently at day 17 of his Ninlaro cycle. He notes that he has not been taking his nausea medication before Ninlaro and has had some nausea. He notes that he has been eating better and has good energy levels.   The pt also notes that he has not been using his anti-histamine medications prior to taking Ninlaro and has not had any issues with skin rashes.   Of note since the patient's last visit, pt has had an ECHO completed on 01/12/18 with results revealing LV EF of 60-65% and When compared to the prior study from 08/24/2016 aortic stenosis is now mild to moderate with peak/mean gradients 35/19 mmHg.  Lab results today (01/24/18) of CBC w/diff is as follows: all values are WNL except for WBC at 3.8k, PLT at 80k, Lymphs abs at 600. 01/24/18 CMP is pending 01/24/18 SFLC and MMP are wnl  On review of systems, pt reports good energy levels, eating well, staying active, dry eyes, and denies skin rashes, concerns for infections, SOB, light headedness, dizziness, CP, leg swelling, and any  other symptoms.    MEDICAL HISTORY:  Past Medical History:  Diagnosis Date  . Amyloidosis ([place])   . Arthritis   . Basal cell carcinoma   . CAD (coronary artery disease)    Lexiscan Myoview 6/14: Normal study, no scar or ischemia, EF 62%  . Cataract   . Chronic kidney disease   . Colon polyp   . Coronary artery disease    moderate disease by cath 2011  . Diverticulosis of colon   . GERD (gastroesophageal reflux disease)   . Hearing loss   . Heart disease   . Hiatal hernia   . Hyperlipidemia   . Hypertension   . MRSA (methicillin resistant staph aureus) culture positive   . Seizure disorder ([place])   . Seizures ([place])    last in 1970s  Obstructive sleep apnea Gastroesophageal reflux disease Aortic stenosis Erectile dysfunction Peripheral neuropathy Seizure disorder   SURGICAL HISTORY: Past Surgical History:  Procedure Laterality Date  . BUNIONECTOMY    . CARPAL TUNNEL RELEASE  1994   left  . CERVICAL FUSION    . compression fracture    . FOOT SURGERY     right -twice, left foot once  . HERNIA REPAIR     Umbilical with PVP  . KNEE ARTHROSCOPY     left knee  . [place]   left - scope  . NASAL SEPTUM SURGERY    . NECK SURGERY    . NOSE SURGERY     twice  . PAROTIDECTOMY Right 03/09/2017  . ROTATOR CUFF REPAIR     twice, both shoulders  . ROTATOR CUFF REPAIR    . SHOULDER SURGERY    . SKIN BIOPSY    . SPINE SURGERY     C2, C3, C4  . TRIGGER FINGER RELEASE     both hands, twice  . TRIGGER FINGER RELEASE    . VASECTOMY      SOCIAL HISTORY: Social History   Socioeconomic History  . Marital status: Married    Spouse name: Not on file  . Number of children: 2  . Years of education: 47  . Highest education level: Not on file  Occupational History  . Occupation: Retired  Scientific laboratory technician  . Financial resource strain: Not on file  . Food insecurity:    Worry: Not on file    Inability: Not on file  . Transportation needs:    Medical: Not on  file    Non-medical: Not on file  Tobacco Use  . Smoking status: Former Smoker    Last attempt to quit: 04/25/1969    Years since quitting: 48.7  . Smokeless tobacco: Former Network engineer and Sexual Activity  . Alcohol use: Yes    Alcohol/week: 7.0 - 14.0 standard drinks    Types: 7 - 14 Glasses of wine per week    Comment: socially shots of wiskey  . Drug use: No  . Sexual activity: Not on file  Lifestyle  . Physical activity:    Days per week: Not on file    Minutes per session:  Not on file  . Stress: Not on file  Relationships  . Social connections:    Talks on phone: Not on file    Gets together: Not on file    Attends religious service: Not on file    Active member of club or organization: Not on file    Attends meetings of clubs or organizations: Not on file    Relationship status: Not on file  . Intimate partner violence:    Fear of current or ex partner: Not on file    Emotionally abused: Not on file    Physically abused: Not on file    Forced sexual activity: Not on file  Other Topics Concern  . Not on file  Social History Narrative   ** Merged History Encounter **   Lives at home w/ his wife   Right-handed   Caffeine: occasional Pepsi      Patient is currently retired.   FAMILY HISTORY: Family History  Problem Relation Age of Onset  . Diabetes Father   . Heart disease Father   . Heart attack Father 4  . Hypertension Father   . Cancer Sister   . Hearing loss Mother   . Eczema Sister   . Hyperlipidemia Unknown   . Diabetes Unknown   . Hypertension Unknown   . Seizures Unknown   . Thyroid disease Unknown   . Colon cancer Neg Hx   . Stroke Neg Hx     ALLERGIES:  is allergic to ramipril.  MEDICATIONS:  Current Outpatient Medications  Medication Sig Dispense Refill  . acyclovir (ZOVIRAX) 800 MG tablet Take 800 mg by mouth 2 (two) times daily.    [name] Kitchen atorvastatin (LIPITOR) 40 MG tablet Take 40 mg by mouth at bedtime.     . cetirizine (ZYRTEC  ALLERGY) 10 MG tablet Take 1 tablet (10 mg total) by mouth daily. 30 tablet 1  . ezetimibe (ZETIA) 10 MG tablet Take 1 tablet by mouth daily.    . famotidine (PEPCID) 20 MG tablet Take 2 tablets (40 mg total) by mouth daily. 60 tablet 1  . folic acid (FOLVITE) 1 MG tablet Take 1 tablet by mouth daily.    . hydrOXYzine (ATARAX/VISTARIL) 25 MG tablet Take 1 tablet (25 mg total) by mouth every 8 (eight) hours as needed for itching. 45 tablet 1  . isosorbide mononitrate (IMDUR) 30 MG 24 hr tablet Take 15 mg by mouth daily.     . ixazomib citrate (NINLARO) 3 MG capsule Take 1 capsule (58m) by mouth once weekly for 3 weeks on, 1 week off, repeat Q 4 weeks. Take on empty stomach 1hr before or 2hrs after food. 3 capsule 2  . losartan (COZAAR) 25 MG tablet Take 25 mg by mouth daily.    . Multiple Vitamin (MULTI-VITAMINS) TABS Take 1 tablet by mouth daily.    . nitroGLYCERIN (NITROSTAT) 0.4 MG SL tablet Place 0.4 mg under the tongue every 5 (five) minutes as needed for chest pain.    .[name] Kitchenomeprazole (PRILOSEC) 20 MG capsule Take 20 mg by mouth daily.      . ondansetron (ZOFRAN) 8 MG tablet Take 8 mg by mouth 2 (two) times daily as needed for nausea or vomiting.    .[name] KitchenoxyCODONE (OXY IR/ROXICODONE) 5 MG immediate release tablet Take 1 tablet by mouth every 6 (six) hours as needed for severe pain.    . potassium chloride SA (K-DUR,KLOR-CON) 20 MEQ tablet Take 20 mEq by mouth at bedtime.     .[name] Kitchen  prochlorperazine (COMPAZINE) 10 MG tablet Take 10 mg by mouth every 6 (six) hours as needed for nausea or vomiting.    . triamcinolone cream (KENALOG) 0.1 % Apply 1 application topically as needed for rash.     No current facility-administered medications for this visit.     REVIEW OF SYSTEMS:    A 10+ POINT REVIEW OF SYSTEMS WAS OBTAINED including neurology, dermatology, psychiatry, cardiac, respiratory, lymph, extremities, GI, GU, Musculoskeletal, constitutional, breasts, reproductive, HEENT.  All pertinent positives are  noted in the HPI.  All others are negative.   PHYSICAL EXAMINATION: ECOG PERFORMANCE STATUS: 1 - Symptomatic but completely ambulatory  Vitals:   01/24/18 1408  BP: 125/85  Pulse: 76  Resp: 18  Temp: 98.5 F (36.9 C)  SpO2: 99%   Filed Weights   01/24/18 1408  Weight: 210 lb 8 oz (95.5 kg)   .Body mass index is 29.36 kg/m.  GENERAL:alert, in no acute distress and comfortable SKIN: no acute rashes, no significant lesions EYES: conjunctiva are pink and non-injected, sclera anicteric OROPHARYNX: MMM, no exudates, no oropharyngeal erythema or ulceration NECK: supple, no JVD LYMPH:  no palpable lymphadenopathy in the cervical, axillary or inguinal regions LUNGS: clear to auscultation b/l with normal respiratory effort HEART: regular rate & rhythm ABDOMEN:  normoactive bowel sounds , non tender, not distended. No palpable hepatosplenomegaly.  Extremity: no pedal edema PSYCH: alert & oriented x 3 with fluent speech NEURO: no focal motor/sensory deficits   LABORATORY DATA:  I have reviewed the data as listed  . CBC Latest Ref Rng & Units 01/24/2018 12/27/2017 11/29/2017  WBC 4.0 - 10.3 K/uL 3.8(L) 6.7 5.2  Hemoglobin 13.0 - 17.1 g/dL 13.5 13.0 13.4  Hematocrit 38.4 - 49.9 % 39.6 38.6 39.5  Platelets 140 - 400 K/uL 80(L) 100(L) 112(L)    CMP Latest Ref Rng & Units 12/27/2017 11/29/2017 10/18/2017  Glucose 70 - 99 mg/dL 140(H) 124(H) 87  BUN 8 - 23 mg/dL 25(H) 23 24(H)  Creatinine 0.61 - 1.24 mg/dL 1.64(H) 1.77(H) 1.77(H)  Sodium 135 - 145 mmol/L 141 139 142  Potassium 3.5 - 5.1 mmol/L 3.8 3.4(L) 4.0  Chloride 98 - 111 mmol/L 104 102 109  CO2 22 - 32 mmol/L '29 26 24  ' Calcium 8.9 - 10.3 mg/dL 8.9 8.9 9.2  Total Protein 6.5 - 8.1 g/dL 6.5 6.9 6.3(L)  Total Bilirubin 0.3 - 1.2 mg/dL 0.9 0.9 0.6  Alkaline Phos 38 - 126 U/L 71 77 68  AST 15 - 41 U/L '26 20 27  ' ALT 0 - 44 U/L '15 16 20        ' RADIOGRAPHIC STUDIES: I have personally reviewed the radiological images as listed and  agreed with the findings in the report. No results found. Result status: Edited Result - FINAL                              *[facility] [employer].                        [place], [place] [address]  [phone]  ------------------------------------------------------------------- Transthoracic Echocardiography  (Report amended )  Patient:    Alex [name], Alex [name] Alex #:       [id number] Study Date: 08/24/2016 Gender:     M Age:        89 Height:     180.3 cm Weight:     90.6 kg BSA:        2.15 m^2 Pt. Status: Room:   ATTENDING    Darlina Guys, MD  Willia Craze, Christopher  REFERRING    McAlhany, Landa, Outpatient  SONOGRAPHER  Roseanna Rainbow  cc:  ------------------------------------------------------------------- LV EF: 65% -   70%  ------------------------------------------------------------------- Indications:      Aortic stenosis 424.1.  ------------------------------------------------------------------- History:   PMH:  Amyloidosis.  Coronary artery disease.  Angina pectoris.  Risk factors:  Hypertension. Dyslipidemia.  ------------------------------------------------------------------- Study Conclusions  - Left ventricle: The cavity size was normal. Wall thickness was   increased in a pattern of mild LVH. Systolic function was   vigorous. The estimated ejection fraction was in the range of 65%   to 70%. Wall motion was normal; there were no regional wall   motion abnormalities. Doppler parameters are consistent with   abnormal left ventricular relaxation (grade 1 diastolic   dysfunction). - Aortic valve: Valve mobility was mildly restricted. - Aortic root: The aortic root was mildly dilated. - Mitral valve: Calcified annulus.  Impressions:  - Vigorous LV systolic function; mild diastolic dysfunction; mild    LVH; calcified aortic valve with no significant AS (peak velocity   2 m/s; mean gradient 9 mmHg); mildly dilated aortic root.    ASSESSMENT & PLAN:   77 y.o.  male with above-mentioned multiple medical comorbidities with  #1 Biopsy proven Renal and Systemic AL Amyloidosis Initial BM Bx shows 9% plasma cells and amyloid deposits. Echo shows normal systolic function with only grade 1 diastolic dysfunction. Skeletal survey with no lytic lesions suggestive of multiple myeloma. Serum K/L ratio increased at about 6.22 on diagnosis and has now improved to 1.5 (wnl with normal kappa lambda serum free light chain ratio ) UPEP shows 24h protein improved from 15g to 5g daily and the last recent one is about 7.3 g /24h  #2  Nephrotic Syndrome - likely related to AL amyloidosis.  Urine Study 07/26/17 showed Protein at 2.232g.  -07/19/17 the pt had a BM Bx exam which revealed Normocellular bone marrow (30-40%) with 2-3% plasma cells and focal amyloid deposition.  PLAN: -Pt remains in remission  -Continue Vitamin B supplement -Sarna OTC with Hydrocortisone for itching as needed - Pt will take Dexamethasone 30m, Zofran 4-815m Hydroxyzine- all 30 minutes before the Ninlaro pill.  -If pt develops a reaction to Ninlaro, we will return to [place] - tolerating at this time. -Discussed pt labwork today, 01/24/18; PLT slightly lower at 80k at day 17 of his Ninlaro cycle.  -Discussed the 01/12/18 ECHO which revealed LV EF of 60-65% -Recommended that the pt begin taking nausea medications before Ninlaro -Recommended that the pt use artificial tears ointment for his dry eyes -Recommended that the pt receive his annual flu vaccine, and continue follow up with his transplant team for his vaccinations  -Continue Acyclovir  -The pt has no prohibitive toxicities from continuing Ninlaro 3 weeks on and one week of at this time.   -Use anti-histamines as needed   #4 Patient Active Problem List   Diagnosis Date Noted    . Folliculitis 0711/94/1740.  AL amyloidosis ([place]) 08/21/2016  . Umbilical hernia 89/50/1156  . DIZZINESS 02/19/2010  . CAD, NATIVE VESSEL 07/21/2009  . CHEST PAIN-UNSPECIFIED 07/21/2009  . HYPERLIPIDEMIA 04/30/2007  . HYPERTENSION 04/30/2007  . GASTROESOPHAGEAL REFLUX DISEASE 04/30/2007  . SEIZURE DISORDER 04/30/2007  . COMPRESSION FRACTURE, L1 VERTEBRA 04/30/2007  . DIVERTICULOSIS, COLON, HX OF 04/30/2007  . Other postprocedural status(V45.89) 04/30/2007  -Continue follow-up with primary care physician for management of other medical co-morbidities. -Recommend if pt experiences spike fever to report to hospital with transplant team    RTC with Dr [name] with labs in 2 months    All of the patients and his wife's questions were answered to their apparent satisfaction. They are agreeable with the plan as noted above .The patient knows to call the clinic with any problems, questions or concerns.  The total time spent in the appt was 25 minutes and more than 50% was on counseling and direct patient cares.     Sullivan Lone MD MS AAHIVMS [facility] Hematology/Oncology Physician [facility]  (Office):       [phone] (Work cell):  [phone] (Fax):           [phone]  I, Baldwin Jamaica, am acting as a scribe for Dr. [name]  .I have reviewed the above documentation for accuracy and completeness, and I agree with the above. Brunetta Genera MD

## 2018-01-24 ENCOUNTER — Inpatient Hospital Stay: Payer: Medicare HMO | Attending: Hematology

## 2018-01-24 ENCOUNTER — Ambulatory Visit: Payer: Medicare HMO | Admitting: Cardiovascular Disease

## 2018-01-24 ENCOUNTER — Inpatient Hospital Stay (HOSPITAL_BASED_OUTPATIENT_CLINIC_OR_DEPARTMENT_OTHER): Payer: Medicare HMO | Admitting: Hematology

## 2018-01-24 ENCOUNTER — Telehealth: Payer: Self-pay | Admitting: Hematology

## 2018-01-24 VITALS — BP 125/85 | HR 76 | Temp 98.5°F | Resp 18 | Ht 71.0 in | Wt 210.5 lb

## 2018-01-24 DIAGNOSIS — Z79899 Other long term (current) drug therapy: Secondary | ICD-10-CM | POA: Insufficient documentation

## 2018-01-24 DIAGNOSIS — N189 Chronic kidney disease, unspecified: Secondary | ICD-10-CM | POA: Insufficient documentation

## 2018-01-24 DIAGNOSIS — G629 Polyneuropathy, unspecified: Secondary | ICD-10-CM | POA: Diagnosis not present

## 2018-01-24 DIAGNOSIS — E785 Hyperlipidemia, unspecified: Secondary | ICD-10-CM | POA: Insufficient documentation

## 2018-01-24 DIAGNOSIS — K449 Diaphragmatic hernia without obstruction or gangrene: Secondary | ICD-10-CM | POA: Diagnosis not present

## 2018-01-24 DIAGNOSIS — E8581 Light chain (AL) amyloidosis: Secondary | ICD-10-CM

## 2018-01-24 DIAGNOSIS — I350 Nonrheumatic aortic (valve) stenosis: Secondary | ICD-10-CM | POA: Insufficient documentation

## 2018-01-24 DIAGNOSIS — N049 Nephrotic syndrome with unspecified morphologic changes: Secondary | ICD-10-CM | POA: Diagnosis not present

## 2018-01-24 DIAGNOSIS — M1990 Unspecified osteoarthritis, unspecified site: Secondary | ICD-10-CM | POA: Insufficient documentation

## 2018-01-24 DIAGNOSIS — Z87891 Personal history of nicotine dependence: Secondary | ICD-10-CM | POA: Diagnosis not present

## 2018-01-24 DIAGNOSIS — G4733 Obstructive sleep apnea (adult) (pediatric): Secondary | ICD-10-CM | POA: Insufficient documentation

## 2018-01-24 DIAGNOSIS — Z85828 Personal history of other malignant neoplasm of skin: Secondary | ICD-10-CM | POA: Diagnosis not present

## 2018-01-24 DIAGNOSIS — I129 Hypertensive chronic kidney disease with stage 1 through stage 4 chronic kidney disease, or unspecified chronic kidney disease: Secondary | ICD-10-CM | POA: Insufficient documentation

## 2018-01-24 DIAGNOSIS — N08 Glomerular disorders in diseases classified elsewhere: Secondary | ICD-10-CM

## 2018-01-24 DIAGNOSIS — E854 Organ-limited amyloidosis: Secondary | ICD-10-CM

## 2018-01-24 DIAGNOSIS — I25119 Atherosclerotic heart disease of native coronary artery with unspecified angina pectoris: Secondary | ICD-10-CM | POA: Diagnosis not present

## 2018-01-24 DIAGNOSIS — K219 Gastro-esophageal reflux disease without esophagitis: Secondary | ICD-10-CM | POA: Insufficient documentation

## 2018-01-24 DIAGNOSIS — D696 Thrombocytopenia, unspecified: Secondary | ICD-10-CM

## 2018-01-24 LAB — CBC WITH DIFFERENTIAL/PLATELET
Basophils Absolute: 0 10*3/uL (ref 0.0–0.1)
Basophils Relative: 0 %
EOS PCT: 1 %
Eosinophils Absolute: 0 10*3/uL (ref 0.0–0.5)
HEMATOCRIT: 39.6 % (ref 38.4–49.9)
Hemoglobin: 13.5 g/dL (ref 13.0–17.1)
LYMPHS PCT: 17 %
Lymphs Abs: 0.6 10*3/uL — ABNORMAL LOW (ref 0.9–3.3)
MCH: 31.5 pg (ref 27.2–33.4)
MCHC: 34.1 g/dL (ref 32.0–36.0)
MCV: 92.3 fL (ref 79.3–98.0)
MONO ABS: 0.3 10*3/uL (ref 0.1–0.9)
Monocytes Relative: 8 %
NEUTROS ABS: 2.8 10*3/uL (ref 1.5–6.5)
Neutrophils Relative %: 74 %
PLATELETS: 80 10*3/uL — AB (ref 140–400)
RBC: 4.29 MIL/uL (ref 4.20–5.82)
RDW: 14.1 % (ref 11.0–14.6)
WBC: 3.8 10*3/uL — ABNORMAL LOW (ref 4.0–10.3)

## 2018-01-24 LAB — CMP (CANCER CENTER ONLY)
ALBUMIN: 3.9 g/dL (ref 3.5–5.0)
ALK PHOS: 79 U/L (ref 38–126)
ALT: 17 U/L (ref 0–44)
ANION GAP: 8 (ref 5–15)
AST: 28 U/L (ref 15–41)
BILIRUBIN TOTAL: 0.8 mg/dL (ref 0.3–1.2)
BUN: 18 mg/dL (ref 8–23)
CALCIUM: 9 mg/dL (ref 8.9–10.3)
CO2: 26 mmol/L (ref 22–32)
Chloride: 105 mmol/L (ref 98–111)
Creatinine: 1.55 mg/dL — ABNORMAL HIGH (ref 0.61–1.24)
GFR, Est AFR Am: 49 mL/min — ABNORMAL LOW (ref >60)
GFR, Estimated: 42 mL/min — ABNORMAL LOW (ref >60)
GLUCOSE: 110 mg/dL — AB (ref 70–99)
POTASSIUM: 3.9 mmol/L (ref 3.5–5.1)
SODIUM: 139 mmol/L (ref 135–145)
Total Protein: 6.8 g/dL (ref 6.5–8.1)

## 2018-01-24 NOTE — Telephone Encounter (Signed)
Gave pt avs and calendar  °

## 2018-01-25 LAB — MULTIPLE MYELOMA PANEL, SERUM
ALBUMIN SERPL ELPH-MCNC: 3.6 g/dL (ref 2.9–4.4)
Albumin/Glob SerPl: 1.4 (ref 0.7–1.7)
Alpha 1: 0.3 g/dL (ref 0.0–0.4)
Alpha2 Glob SerPl Elph-Mcnc: 0.9 g/dL (ref 0.4–1.0)
B-Globulin SerPl Elph-Mcnc: 0.9 g/dL (ref 0.7–1.3)
Gamma Glob SerPl Elph-Mcnc: 0.6 g/dL (ref 0.4–1.8)
Globulin, Total: 2.7 g/dL (ref 2.2–3.9)
IGA: 72 mg/dL (ref 61–437)
IgG (Immunoglobin G), Serum: 697 mg/dL — ABNORMAL LOW (ref 700–1600)
IgM (Immunoglobulin M), Srm: 50 mg/dL (ref 15–143)
TOTAL PROTEIN ELP: 6.3 g/dL (ref 6.0–8.5)

## 2018-01-25 LAB — KAPPA/LAMBDA LIGHT CHAINS
KAPPA FREE LGHT CHN: 16.5 mg/L (ref 3.3–19.4)
KAPPA, LAMDA LIGHT CHAIN RATIO: 0.64 (ref 0.26–1.65)
LAMDA FREE LIGHT CHAINS: 25.6 mg/L (ref 5.7–26.3)

## 2018-02-07 ENCOUNTER — Telehealth: Payer: Self-pay

## 2018-02-07 NOTE — Telephone Encounter (Signed)
Pt wife called earlier requesting recent OV note or letter stating that pt has a permanent condition as requested by the [place]. Letter written per Dr. [name] that pt has a diagnosis of AL amyloidosis which is a condition that will require ongoing oncologic follow-up and treatment. Called pt to let him know letter is ready at desk for p/u. Pt plans to swing by the office tomorrow.

## 2018-02-16 ENCOUNTER — Telehealth: Payer: Self-pay

## 2018-02-16 NOTE — Telephone Encounter (Signed)
Received two copies of "Disability Benefits Questionnaire" from the [place]. Forms completed by Carmelia Roller and signed by Dr. [name]. LVM with pt communicating that he may pick them up during the week. Business hours 8am-4:30pm.

## 2018-02-20 ENCOUNTER — Other Ambulatory Visit: Payer: Self-pay | Admitting: *Deleted

## 2018-02-20 DIAGNOSIS — E8581 Light chain (AL) amyloidosis: Secondary | ICD-10-CM

## 2018-02-20 MED ORDER — IXAZOMIB CITRATE 3 MG PO CAPS: ORAL_CAPSULE | ORAL | 2 refills | Status: DC

## 2018-02-21 DIAGNOSIS — R1033 Periumbilical pain: Secondary | ICD-10-CM | POA: Diagnosis not present

## 2018-02-21 DIAGNOSIS — Z6830 Body mass index (BMI) 30.0-30.9, adult: Secondary | ICD-10-CM | POA: Diagnosis not present

## 2018-02-21 DIAGNOSIS — E8581 Light chain (AL) amyloidosis: Secondary | ICD-10-CM | POA: Diagnosis not present

## 2018-02-22 ENCOUNTER — Other Ambulatory Visit: Payer: Self-pay | Admitting: Internal Medicine

## 2018-02-22 DIAGNOSIS — R1033 Periumbilical pain: Secondary | ICD-10-CM

## 2018-02-27 ENCOUNTER — Ambulatory Visit
Admission: RE | Admit: 2018-02-27 | Discharge: 2018-02-27 | Disposition: A | Discharge status: Home / Self Care | Payer: Medicare HMO | Source: Ambulatory Visit | Attending: Internal Medicine | Admitting: Internal Medicine

## 2018-02-27 DIAGNOSIS — K5730 Diverticulosis of large intestine without perforation or abscess without bleeding: Secondary | ICD-10-CM | POA: Diagnosis not present

## 2018-02-27 DIAGNOSIS — R1033 Periumbilical pain: Secondary | ICD-10-CM

## 2018-03-07 DIAGNOSIS — Z6830 Body mass index (BMI) 30.0-30.9, adult: Secondary | ICD-10-CM | POA: Diagnosis not present

## 2018-03-07 DIAGNOSIS — J069 Acute upper respiratory infection, unspecified: Secondary | ICD-10-CM | POA: Diagnosis not present

## 2018-03-08 DIAGNOSIS — Z9481 Bone marrow transplant status: Secondary | ICD-10-CM | POA: Diagnosis not present

## 2018-03-08 DIAGNOSIS — N049 Nephrotic syndrome with unspecified morphologic changes: Secondary | ICD-10-CM | POA: Diagnosis not present

## 2018-03-08 DIAGNOSIS — N183 Chronic kidney disease, stage 3 (moderate): Secondary | ICD-10-CM | POA: Diagnosis not present

## 2018-03-08 DIAGNOSIS — I129 Hypertensive chronic kidney disease with stage 1 through stage 4 chronic kidney disease, or unspecified chronic kidney disease: Secondary | ICD-10-CM | POA: Diagnosis not present

## 2018-03-12 ENCOUNTER — Inpatient Hospital Stay: Payer: Medicare HMO | Attending: Hematology | Admitting: Medical

## 2018-03-12 ENCOUNTER — Telehealth: Payer: Self-pay | Admitting: *Deleted

## 2018-03-12 ENCOUNTER — Telehealth: Payer: Self-pay | Admitting: Hematology

## 2018-03-12 VITALS — BP 139/98 | HR 91 | Temp 97.7°F | Resp 18 | Ht 71.0 in | Wt 209.3 lb

## 2018-03-12 DIAGNOSIS — Z79899 Other long term (current) drug therapy: Secondary | ICD-10-CM | POA: Diagnosis not present

## 2018-03-12 DIAGNOSIS — E8581 Light chain (AL) amyloidosis: Secondary | ICD-10-CM | POA: Insufficient documentation

## 2018-03-12 DIAGNOSIS — N390 Urinary tract infection, site not specified: Secondary | ICD-10-CM | POA: Insufficient documentation

## 2018-03-12 DIAGNOSIS — L089 Local infection of the skin and subcutaneous tissue, unspecified: Secondary | ICD-10-CM

## 2018-03-12 DIAGNOSIS — Z87891 Personal history of nicotine dependence: Secondary | ICD-10-CM | POA: Diagnosis not present

## 2018-03-12 DIAGNOSIS — I10 Essential (primary) hypertension: Secondary | ICD-10-CM | POA: Diagnosis not present

## 2018-03-12 MED ORDER — MUPIROCIN 2 % EX OINT: TOPICAL_OINTMENT | CUTANEOUS | 1 refills | Status: DC

## 2018-03-12 MED ORDER — PROCHLORPERAZINE MALEATE 10 MG PO TABS: 10.0000 mg | ORAL_TABLET | Freq: Four times a day (QID) | ORAL | 2 refills | Status: DC | PRN

## 2018-03-12 NOTE — Progress Notes (Signed)
Pt presents with possible bug bite on inner crease of L arm.  States that yesterday he felt something crawling on/biting him and that the welt occurred right afterwards.  Denies bleeding or drainage from slightly raised red lump.  Afebrile.  No rash, itching, or pain around site.  Denies trouble breathing or swallowing.

## 2018-03-12 NOTE — Telephone Encounter (Signed)
Wife left VM: Has swollen area above L antecubital area. Not sure if big blister or bug bite. Center looks like pus. Itchy. No fever. Contacted patient to ask him to come to symptom management today. Patient verbalized understanding.

## 2018-03-12 NOTE — Patient Instructions (Signed)
Insect Bite, Adult An insect bite can make your skin red, itchy, and swollen. Some insects can spread disease to people with a bite. However, most insect bites do not lead to disease, and most are not serious. Follow these instructions at home: Bite area care  Do not scratch the bite area.  Keep the bite area clean and dry.  Wash the bite area every day with soap and water as told by your doctor.  Check the bite area every day for signs of infection. Check for: ? More redness, swelling, or pain. ? Fluid or blood. ? Warmth. ? Pus. Managing pain, itching, and swelling  You may put any of these on the bite area as told by your doctor: ? A baking soda paste. ? Cortisone cream. ? Calamine lotion.  If directed, put ice on the bite area. ? Put ice in a plastic bag. ? Place a towel between your skin and the bag. ? Leave the ice on for 20 minutes, 2-3 times a day. Medicines  Take medicines or put medicines on your skin only as told by your doctor.  If you were prescribed an antibiotic medicine, use it as told by your doctor. Do not stop using the antibiotic even if your condition improves. General instructions  Keep all follow-up visits as told by your doctor. This is important. How is this prevented? To help you have a lower risk of insect bites:  When you are outside, wear clothing that covers your arms and legs.  Use insect repellent. The best insect repellents have: ? An active ingredient of DEET, picaridin, oil of lemon eucalyptus (OLE), or IR3535. ? Higher amounts of DEET or another active ingredient than other repellents have.  If your home windows do not have screens, think about putting some in.  Contact a doctor if:  You have more redness, swelling, or pain in the bite area.  You have fluid, blood, or pus coming from the bite area.  The bite area feels warm.  You have a fever. Get help right away if:  You have joint pain.  You have a rash.  You have  shortness of breath.  You feel more tired or sleepy than you normally do.  You have neck pain.  You have a headache.  You feel weaker than you normally do.  You have chest pain.  You have pain in your belly.  You feel sick to your stomach (nauseous) or you throw up (vomit). Summary  An insect bite can make your skin red, itchy, and swollen.  Do not scratch the bite area, and keep it clean and dry.  Ice can help with pain and itching from the bite. This information is not intended to replace advice given to you by your health care provider. Make sure you discuss any questions you have with your health care provider. Document Released: 04/08/2000 Document Revised: 11/12/2015 Document Reviewed: 08/27/2014 Elsevier Interactive Patient Education  2018 [employer]  

## 2018-03-12 NOTE — Telephone Encounter (Signed)
Scheduled appt per 11/18 sch message.   pt is aware of appt.

## 2018-03-13 NOTE — Telephone Encounter (Signed)
error 

## 2018-03-15 NOTE — Progress Notes (Signed)
[facility] Progress Note   Alex [name] [id number] 1940-09-24 77 y.o.  [name]. is managed by Dr. 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 Sullivan Lone.  The patient is a follow-up appointment on 03/28/2018.  Skin pustule of the left anterior elbow: The patient was told to continue the Cipro that he is taking for a urinary tract infection.  He was also given a prescription for Bactroban and instructed to use this 3 times daily until the area clears.  He will return or call as needed.  Please see After Visit Summary for patient specific instructions.  Future Appointments  Date Time Provider [place]  03/28/2018  1:30 PM [facility] 2 [facility] None  03/28/2018  2:00 PM Brunetta Genera, MD [facility] None    No orders of the defined types were placed in this encounter.      Subjective:   Patient ID:  Alex [name]. is a 77 y.o. (DOB May 28, 1940) male.  Chief Complaint:  Chief Complaint  Patient presents with  . Bite    HPI Alex [name]. is a 77 year old male with a history of a L amyloidosis who is managed by Dr. [name] and is treated with Ninlaro.  He presents to the office today with his daughter.  He reports having a bite and the anterior left elbow earlier today.  He noted development of a pustule in the area which has now begun to resolve.  He denies pain, injury, or an IV or phlebotomy in that area.  He denies fevers, chills, sweats, shortness of breath, chest pain, or difficulty swallowing.  Medications: I have reviewed the patient's current medications.  Allergies:  Allergies  Allergen Reactions  . Ramipril Cough    Past Medical History:  Diagnosis Date  . Amyloidosis ([place])   . Arthritis    . Basal cell carcinoma   . CAD (coronary artery disease)    Lexiscan Myoview 6/14: Normal study, no scar or ischemia, EF 62%  . Cataract   . Chronic kidney disease   . Colon polyp   . Coronary artery disease    moderate disease by cath 2011  . Diverticulosis of colon   . GERD (gastroesophageal reflux disease)   . Hearing loss   . Heart disease   . Hiatal hernia   . Hyperlipidemia   . Hypertension   . MRSA (methicillin resistant staph aureus) culture positive   . Seizure disorder ([place])   . Seizures ([place])    last in 1970s    Past Surgical History:  Procedure Laterality Date  . BUNIONECTOMY    . CARPAL TUNNEL RELEASE  1994   left  . CERVICAL FUSION    . compression fracture    . FOOT SURGERY     right -twice, left foot once  . HERNIA REPAIR     Umbilical with PVP  . KNEE ARTHROSCOPY     left knee  . [place]   left - scope  . NASAL SEPTUM SURGERY    . NECK SURGERY    . NOSE SURGERY     twice  . PAROTIDECTOMY Right 03/09/2017  . ROTATOR CUFF REPAIR     twice, both shoulders  . ROTATOR CUFF REPAIR    .  SHOULDER SURGERY    . SKIN BIOPSY    . SPINE SURGERY     C2, C3, C4  . TRIGGER FINGER RELEASE     both hands, twice  . TRIGGER FINGER RELEASE    . VASECTOMY      Family History  Problem Relation Age of Onset  . Diabetes Father   . Heart disease Father   . Heart attack Father 63  . Hypertension Father   . Cancer Sister   . Hearing loss Mother   . Eczema Sister   . Hyperlipidemia Unknown   . Diabetes Unknown   . Hypertension Unknown   . Seizures Unknown   . Thyroid disease Unknown   . Colon cancer Neg Hx   . Stroke Neg Hx     Social History   Socioeconomic History  . Marital status: Married    Spouse name: Not on file  . Number of children: 2  . Years of education: 72  . Highest education level: Not on file  Occupational History  . Occupation: Retired  Scientific laboratory technician  . Financial resource strain: Not on file  . Food insecurity:     Worry: Not on file    Inability: Not on file  . Transportation needs:    Medical: Not on file    Non-medical: Not on file  Tobacco Use  . Smoking status: Former Smoker    Last attempt to quit: 04/25/1969    Years since quitting: 48.9  . Smokeless tobacco: Former Network engineer and Sexual Activity  . Alcohol use: Yes    Alcohol/week: 7.0 - 14.0 standard drinks    Types: 7 - 14 Glasses of wine per week    Comment: socially shots of wiskey  . Drug use: No  . Sexual activity: Not on file  Lifestyle  . Physical activity:    Days per week: Not on file    Minutes per session: Not on file  . Stress: Not on file  Relationships  . Social connections:    Talks on phone: Not on file    Gets together: Not on file    Attends religious service: Not on file    Active member of club or organization: Not on file    Attends meetings of clubs or organizations: Not on file    Relationship status: Not on file  . Intimate partner violence:    Fear of current or ex partner: Not on file    Emotionally abused: Not on file    Physically abused: Not on file    Forced sexual activity: Not on file  Other Topics Concern  . Not on file  Social History Narrative   ** Merged History Encounter **   Lives at home w/ his wife   Right-handed   Caffeine: occasional Pepsi        Past Medical History, Surgical history, Social history, and Family history were reviewed and updated as appropriate.   Please see review of systems for further details on the patient's review from today.   Review of Systems:  Review of Systems  Constitutional: Negative for chills, diaphoresis and fever.  HENT: Negative for trouble swallowing.   Respiratory: Negative for shortness of breath.   Cardiovascular: Negative for chest pain.  Skin:       Resolving pustule of the left dorsal elbow.    Objective:   Physical Exam:  BP (!) 139/98 (BP Location: Right Arm, Patient Position: Sitting)   Pulse 91  Temp 97.7 F (36.5  C) (Oral)   Resp 18   Ht 5\' 11"  (1.803 m)   Wt 209 lb 4.8 oz (94.9 kg)   SpO2 100%   BMI 29.19 kg/m  ECOG: 0  Physical Exam  Constitutional: No distress.  HENT:  Head: Normocephalic and atraumatic.  Neurological: He is alert. Coordination normal.  Skin: Skin is warm and dry. No rash noted. He is not diaphoretic. No erythema.  There is a 0.5 x 0.5 slightly raised fleshy lesion in the left anterior elbow.  No erythema, increased warmth, or exudate is noted.  Psychiatric: He has a normal mood and affect. His behavior is normal. Judgment and thought content normal.    Lab Review:     Component Value Date/Time   NA 139 01/24/2018 1341   NA 142 01/30/2017 1331   K 3.9 01/24/2018 1341   K 3.7 01/30/2017 1331   CL 105 01/24/2018 1341   CO2 26 01/24/2018 1341   CO2 26 01/30/2017 1331   GLUCOSE 110 (H) 01/24/2018 1341   GLUCOSE 111 01/30/2017 1331   BUN 18 01/24/2018 1341   BUN 20.0 01/30/2017 1331   CREATININE 1.55 (H) 01/24/2018 1341   CREATININE 1.2 01/30/2017 1331   CALCIUM 9.0 01/24/2018 1341   CALCIUM 9.1 01/30/2017 1331   PROT 6.8 01/24/2018 1341   PROT 5.6 (L) 01/30/2017 1331   ALBUMIN 3.9 01/24/2018 1341   ALBUMIN 2.9 (L) 01/30/2017 1331   AST 28 01/24/2018 1341   AST 25 01/30/2017 1331   ALT 17 01/24/2018 1341   ALT 17 01/30/2017 1331   ALKPHOS 79 01/24/2018 1341   ALKPHOS 61 01/30/2017 1331   BILITOT 0.8 01/24/2018 1341   BILITOT 0.68 01/30/2017 1331   GFRNONAA 42 (L) 01/24/2018 1341   GFRAA 49 (L) 01/24/2018 1341       Component Value Date/Time   WBC 3.8 (L) 01/24/2018 1341   RBC 4.29 01/24/2018 1341   HGB 13.5 01/24/2018 1341   HGB 12.4 (L) 09/20/2017 0911   HGB 11.8 (L) 01/30/2017 1329   HCT 39.6 01/24/2018 1341   HCT 34.5 (L) 01/30/2017 1329   PLT 80 (L) 01/24/2018 1341   PLT 114 (L) 09/20/2017 0911   PLT 126 (L) 01/30/2017 1329   MCV 92.3 01/24/2018 1341   MCV 98.9 (H) 01/30/2017 1329   MCH 31.5 01/24/2018 1341   MCHC 34.1 01/24/2018 1341    RDW 14.1 01/24/2018 1341   RDW 13.9 01/30/2017 1329   LYMPHSABS 0.6 (L) 01/24/2018 1341   LYMPHSABS 1.0 01/30/2017 1329   MONOABS 0.3 01/24/2018 1341   MONOABS 0.8 01/30/2017 1329   EOSABS 0.0 01/24/2018 1341   EOSABS 0.3 01/30/2017 1329   BASOSABS 0.0 01/24/2018 1341   BASOSABS 0.0 01/30/2017 1329   -------------------------------  Imaging from last 24 hours (if applicable):  Radiology interpretation: Ct Abdomen Pelvis Wo Contrast  Result Date: 02/28/2018 CLINICAL DATA:  Periumbilical abdominal pain for several months. History of amyloidosis treated with chemotherapy. History of hernia repair. EXAM: CT ABDOMEN AND PELVIS WITHOUT CONTRAST TECHNIQUE: Multidetector CT imaging of the abdomen and pelvis was performed following the standard protocol without IV contrast. COMPARISON:  09/13/2001 CT abdomen/pelvis. FINDINGS: Lower chest: Lingular 4 mm solid pulmonary nodule (series 3/image 12) is stable since 11/16/2016 chest CT, considered benign. Coronary atherosclerosis. Hepatobiliary: Normal liver size. No liver mass. Normal gallbladder with no radiopaque cholelithiasis. No biliary ductal dilatation. Pancreas: Normal, with no mass or duct dilation. Spleen: Normal size. No mass. Adrenals/Urinary Tract:  Normal adrenals. No renal stones. No hydronephrosis. No contour deforming renal mass. Normal bladder. Stomach/Bowel: Normal non-distended stomach. Normal caliber small bowel with no small bowel wall thickening. Normal appendix. Moderate left colonic diverticulosis, most prominent in the sigmoid colon, with no large bowel wall thickening or acute pericolonic fat stranding. Vascular/Lymphatic: Atherosclerotic abdominal aorta with 3.2 cm infrarenal abdominal aortic aneurysm, increased from 3.0 cm in 2003. No pathologically enlarged lymph nodes in the abdomen or pelvis. Reproductive: Mildly enlarged prostate with nonspecific internal prostatic calcifications. Other: No pneumoperitoneum, ascites or focal fluid  collection. No evidence of a recurrent ventral abdominal hernia. No superficial masses or fluid collections. Musculoskeletal: No aggressive appearing focal osseous lesions. Moderate thoracolumbar spondylosis. Mild chronic L1 vertebral compression fracture is stable since 10/19/2016 lumbar spine radiographs. IMPRESSION: 1. No acute abnormality. No evidence of bowel obstruction or acute bowel inflammation. No evidence of recurrent ventral abdominal hernia. 2. Moderate left colonic diverticulosis, with no evidence of acute diverticulitis. 3. Mildly enlarged prostate. 4. Infrarenal 3.2 cm Abdominal Aortic Aneurysm (ICD10-I71.9). Recommend follow-up aortic ultrasound in 3 years. This recommendation follows ACR consensus guidelines: White Paper of the ACR Incidental Findings Committee II on Vascular Findings. J Am Coll Radiol 2013; 32:992-426. 5.  Aortic Atherosclerosis (ICD10-I70.0). Electronically Signed   By: Ilona Sorrel M.D.   On: 02/28/2018 08:44

## 2018-03-25 ENCOUNTER — Encounter

## 2018-03-27 NOTE — Progress Notes (Signed)
HEMATOLOGY/ONCOLOGY CLINIC NOTE  Date of Service: 03/28/18   Patient Care Team: [name], MD as PCP - General (Internal Medicine) Burnell Blanks, MD as PCP - Cardiology (Cardiology) Brunetta Genera, MD as Consulting Physician (Hematology) Nephrology - Dr Madelon Lips MD Dr Henrene Pastor MD - GI  CHIEF COMPLAINTS/PURPOSE OF CONSULTATION:  AL Amyloidosis -with Nephrotic syndrome . No overt CHF pEF  HISTORY OF PRESENTING ILLNESS:   Alex [name]. is a wonderful 77 y.o. male who has been referred to [place] by Dr .[name], MD /Dr Madelon Lips MD for evaluation and management of newly diagnosed Kidney AL Amyloidosis with nephrotic syndrome.  Patient has a history of hypertension, dyslipidemia, coronary artery disease, seizure disorder on Dilantin who was apparently in his usual state of health until 3 months ago when he started developing new onset lower extremity edema. Patient had a UA at the time that showed 4+ protein in 24-hour collection revealed 15 g of protein. Additional workup showed a creatinine of 0.8 with an albumin of 2.3 and negative SPEP and UPEP. Apparently had an elevated K/L SFLC ratio of 5.19.  He was urgently referred by his primary care physician to nephrology for additional evaluation. Patient notes no bleeding issues. No nosebleeds. No periorbital bleeding. No abnormal skin rashes. Has had significant NSAID exposure and use to take a fair amount of naproxen for chronic low back pain but has cut down on this.  Due to lack of clear etiology for his nephrotic syndrome the patient underwent a kidney biopsy on 08/06/2015 accession [id number] which showed AL amyloidosis, lambda immunophenotype, [place] red positive. Noted to have severe arteriosclerosis with moderate tubular interstitial scarring.  Patient was referred to [place] for further evaluation and treatment of his AL Amyloidosis, Patient notes that his leg swelling has improved with diuretic  therapy.  He notes no weight loss. No night sweats. No focal bone pains. No skin rashes. Lungs normal bleeding or bruising.  Patient notes that he had a lot of reading online and he and his wife had an extensive list of questions which were answered in detail.  He notes he has had some chronic issues with upper abdominal pain and nausea and that he follows with Dr. Henrene Pastor and is apparently being scheduled for an EGD and colonoscopy according to his report.  Denies having and lacks tongue. No chest pain and no overt new shortness of breath. Patient is uncertain if an echocardiogram has been ordered or scheduled.  INTERVAL HISTORY  Alex [name] is here for a scheduled follow-up for his AL amyloidosis. The patient's last visit with [place] was on 01/24/18. He is accompanied today by his wife. The pt reports that he is doing well overall.   The pt reports that he has been having a recurring blister in the inside of his left elbow. He notes that he first believed it to be a bug bite, and it resolved on its own, until this morning. He denies any associations or perceiving a pattern to it.   The pt notes that he has increased his activity levels recently. He notes that he is tolerating the Ninlaro very well and denies any skin rashes or diarrhea.   He notes that his neuropathy has intermittently presented, and denies association with taking Ninlaro or other patterns. He notes that some days his feet and bottom of his leg burns, but other days it does not present at all. He notes that it has not kept him from  doing what he wants to do. He denies this keeping him up at night.   Lab results today (03/28/18) of CBC w/diff and CMP is as follows: all values are WNL except for PLT at 107k, Creatinine at 1.47, GFR at 46. 03/28/18 MMP and SFLC are wnl  On review of systems, pt reports intermittent lower extremity neuropathy, good energy levels, increased activity levels, recurrent blister in his inner left elbow, and  denies skin rashes, diarrhea, abdominal pains, mouth sores, pain along the spine, leg swelling, and any other symptoms.   MEDICAL HISTORY:  Past Medical History:  Diagnosis Date  . Amyloidosis ([place])   . Arthritis   . Basal cell carcinoma   . CAD (coronary artery disease)    Lexiscan Myoview 6/14: Normal study, no scar or ischemia, EF 62%  . Cataract   . Chronic kidney disease   . Colon polyp   . Coronary artery disease    moderate disease by cath 2011  . Diverticulosis of colon   . GERD (gastroesophageal reflux disease)   . Hearing loss   . Heart disease   . Hiatal hernia   . Hyperlipidemia   . Hypertension   . MRSA (methicillin resistant staph aureus) culture positive   . Seizure disorder ([place])   . Seizures ([place])    last in 1970s  Obstructive sleep apnea Gastroesophageal reflux disease Aortic stenosis Erectile dysfunction Peripheral neuropathy Seizure disorder   SURGICAL HISTORY: Past Surgical History:  Procedure Laterality Date  . BUNIONECTOMY    . CARPAL TUNNEL RELEASE  1994   left  . CERVICAL FUSION    . compression fracture    . FOOT SURGERY     right -twice, left foot once  . HERNIA REPAIR     Umbilical with PVP  . KNEE ARTHROSCOPY     left knee  . [place]   left - scope  . NASAL SEPTUM SURGERY    . NECK SURGERY    . NOSE SURGERY     twice  . PAROTIDECTOMY Right 03/09/2017  . ROTATOR CUFF REPAIR     twice, both shoulders  . ROTATOR CUFF REPAIR    . SHOULDER SURGERY    . SKIN BIOPSY    . SPINE SURGERY     C2, C3, C4  . TRIGGER FINGER RELEASE     both hands, twice  . TRIGGER FINGER RELEASE    . VASECTOMY      SOCIAL HISTORY: Social History   Socioeconomic History  . Marital status: Married    Spouse name: Not on file  . Number of children: 2  . Years of education: 19  . Highest education level: Not on file  Occupational History  . Occupation: Retired  Scientific laboratory technician  . Financial resource strain: Not on file  . Food  insecurity:    Worry: Not on file    Inability: Not on file  . Transportation needs:    Medical: Not on file    Non-medical: Not on file  Tobacco Use  . Smoking status: Former Smoker    Last attempt to quit: 04/25/1969    Years since quitting: 48.9  . Smokeless tobacco: Former Network engineer and Sexual Activity  . Alcohol use: Yes    Alcohol/week: 7.0 - 14.0 standard drinks    Types: 7 - 14 Glasses of wine per week    Comment: socially shots of wiskey  . Drug use: No  . Sexual activity: Not on file  Lifestyle  . Physical activity:    Days per week: Not on file    Minutes per session: Not on file  . Stress: Not on file  Relationships  . Social connections:    Talks on phone: Not on file    Gets together: Not on file    Attends religious service: Not on file    Active member of club or organization: Not on file    Attends meetings of clubs or organizations: Not on file    Relationship status: Not on file  . Intimate partner violence:    Fear of current or ex partner: Not on file    Emotionally abused: Not on file    Physically abused: Not on file    Forced sexual activity: Not on file  Other Topics Concern  . Not on file  Social History Narrative   ** Merged History Encounter **   Lives at home w/ his wife   Right-handed   Caffeine: occasional Pepsi      Patient is currently retired.   FAMILY HISTORY: Family History  Problem Relation Age of Onset  . Diabetes Father   . Heart disease Father   . Heart attack Father 45  . Hypertension Father   . Cancer Sister   . Hearing loss Mother   . Eczema Sister   . Hyperlipidemia Unknown   . Diabetes Unknown   . Hypertension Unknown   . Seizures Unknown   . Thyroid disease Unknown   . Colon cancer Neg Hx   . Stroke Neg Hx     ALLERGIES:  is allergic to ramipril.  MEDICATIONS:  Current Outpatient Medications  Medication Sig Dispense Refill  . acyclovir (ZOVIRAX) 800 MG tablet Take 800 mg by mouth 2 (two) times  daily.    [name] Kitchen atorvastatin (LIPITOR) 40 MG tablet Take 40 mg by mouth at bedtime.     . cetirizine (ZYRTEC ALLERGY) 10 MG tablet Take 1 tablet (10 mg total) by mouth daily. 30 tablet 1  . ezetimibe (ZETIA) 10 MG tablet Take 1 tablet by mouth daily.    . famotidine (PEPCID) 20 MG tablet Take 2 tablets (40 mg total) by mouth daily. 60 tablet 1  . folic acid (FOLVITE) 1 MG tablet Take 1 tablet by mouth daily.    . hydrOXYzine (ATARAX/VISTARIL) 25 MG tablet Take 1 tablet (25 mg total) by mouth every 8 (eight) hours as needed for itching. 45 tablet 1  . isosorbide mononitrate (IMDUR) 30 MG 24 hr tablet Take 15 mg by mouth daily.     . ixazomib citrate (NINLARO) 3 MG capsule Take 1 capsule (20m) by mouth once weekly for 3 weeks on, 1 week off, repeat Q 4 weeks. Take on empty stomach 1hr before or 2hrs after food. 3 capsule 2  . levofloxacin (LEVAQUIN) 500 MG tablet     . losartan (COZAAR) 25 MG tablet Take 25 mg by mouth daily.    . Multiple Vitamin (MULTI-VITAMINS) TABS Take 1 tablet by mouth daily.    . mupirocin ointment (BACTROBAN) 2 % Apply to skin lesion 3 times daily. 30 g 1  . nitroGLYCERIN (NITROSTAT) 0.4 MG SL tablet Place 0.4 mg under the tongue every 5 (five) minutes as needed for chest pain.    .[name] Kitchenomeprazole (PRILOSEC) 20 MG capsule Take 20 mg by mouth daily.      . ondansetron (ZOFRAN) 8 MG tablet Take 8 mg by mouth 2 (two) times daily as needed for nausea or vomiting.    .[name] Kitchen  oxyCODONE (OXY IR/ROXICODONE) 5 MG immediate release tablet Take 1 tablet by mouth every 6 (six) hours as needed for severe pain.    . potassium chloride SA (K-DUR,KLOR-CON) 20 MEQ tablet Take 20 mEq by mouth at bedtime.     . prochlorperazine (COMPAZINE) 10 MG tablet Take 1 tablet (10 mg total) by mouth every 6 (six) hours as needed for nausea or vomiting. 30 tablet 2  . triamcinolone cream (KENALOG) 0.1 % Apply 1 application topically as needed for rash.     No current facility-administered medications for this visit.      REVIEW OF SYSTEMS:    A 10+ POINT REVIEW OF SYSTEMS WAS OBTAINED including neurology, dermatology, psychiatry, cardiac, respiratory, lymph, extremities, GI, GU, Musculoskeletal, constitutional, breasts, reproductive, HEENT.  All pertinent positives are noted in the HPI.  All others are negative.   PHYSICAL EXAMINATION: ECOG PERFORMANCE STATUS: 1 - Symptomatic but completely ambulatory  Vitals:   03/28/18 1434  BP: 117/77  Pulse: 68  Resp: 18  Temp: 97.6 F (36.4 C)  SpO2: 98%   Filed Weights   03/28/18 1434  Weight: 208 lb 1.6 oz (94.4 kg)   .Body mass index is 29.02 kg/m.  GENERAL:alert, in no acute distress and comfortable SKIN: no acute rashes, no significant lesions EYES: conjunctiva are pink and non-injected, sclera anicteric OROPHARYNX: MMM, no exudates, no oropharyngeal erythema or ulceration NECK: supple, no JVD LYMPH:  no palpable lymphadenopathy in the cervical, axillary or inguinal regions LUNGS: clear to auscultation b/l with normal respiratory effort HEART: regular rate & rhythm ABDOMEN:  normoactive bowel sounds , non tender, not distended. No palpable hepatosplenomegaly.  Extremity: no pedal edema PSYCH: alert & oriented x 3 with fluent speech NEURO: no focal motor/sensory deficits    LABORATORY DATA:  I have reviewed the data as listed  . CBC Latest Ref Rng & Units 03/28/2018 01/24/2018 12/27/2017  WBC 4.0 - 10.5 K/uL 6.0 3.8(L) 6.7  Hemoglobin 13.0 - 17.0 g/dL 13.5 13.5 13.0  Hematocrit 39.0 - 52.0 % 39.3 39.6 38.6  Platelets 150 - 400 K/uL 107(L) 80(L) 100(L)    CMP Latest Ref Rng & Units 03/28/2018 01/24/2018 12/27/2017  Glucose 70 - 99 mg/dL 90 110(H) 140(H)  BUN 8 - 23 mg/dL 21 18 25(H)  Creatinine 0.61 - 1.24 mg/dL 1.47(H) 1.55(H) 1.64(H)  Sodium 135 - 145 mmol/L 142 139 141  Potassium 3.5 - 5.1 mmol/L 3.8 3.9 3.8  Chloride 98 - 111 mmol/L 106 105 104  CO2 22 - 32 mmol/L _0 Calcium 8.9 - 10.3 mg/dL 9.0 9.0 8.9  Total Protein 6.5 - 8.1  g/dL 6.7 6.8 6.5  Total Bilirubin 0.3 - 1.2 mg/dL 0.8 0.8 0.9  Alkaline Phos 38 - 126 U/L 73 79 71  AST 15 - 41 U/L 35 28 26  ALT 0 - 44 U/L _1 RADIOGRAPHIC STUDIES: I have personally reviewed the radiological images as listed and agreed with the findings in the report. Ct Abdomen Pelvis Wo Contrast  Result Date: 02/28/2018 CLINICAL DATA:  Periumbilical abdominal pain for several months. History of amyloidosis treated with chemotherapy. History of hernia repair. EXAM: CT ABDOMEN AND PELVIS WITHOUT CONTRAST TECHNIQUE: Multidetector CT imaging of the abdomen and pelvis was performed following the standard protocol without IV contrast. COMPARISON:  09/13/2001 CT abdomen/pelvis. FINDINGS: Lower chest: Lingular 4 mm solid pulmonary nodule (series 3/image 12) is stable since 11/16/2016 chest CT, considered benign. Coronary atherosclerosis. Hepatobiliary:  Normal liver size. No liver mass. Normal gallbladder with no radiopaque cholelithiasis. No biliary ductal dilatation. Pancreas: Normal, with no mass or duct dilation. Spleen: Normal size. No mass. Adrenals/Urinary Tract: Normal adrenals. No renal stones. No hydronephrosis. No contour deforming renal mass. Normal bladder. Stomach/Bowel: Normal non-distended stomach. Normal caliber small bowel with no small bowel wall thickening. Normal appendix. Moderate left colonic diverticulosis, most prominent in the sigmoid colon, with no large bowel wall thickening or acute pericolonic fat stranding. Vascular/Lymphatic: Atherosclerotic abdominal aorta with 3.2 cm infrarenal abdominal aortic aneurysm, increased from 3.0 cm in 2003. No pathologically enlarged lymph nodes in the abdomen or pelvis. Reproductive: Mildly enlarged prostate with nonspecific internal prostatic calcifications. Other: No pneumoperitoneum, ascites or focal fluid collection. No evidence of a recurrent ventral abdominal hernia. No superficial masses or fluid collections.  Musculoskeletal: No aggressive appearing focal osseous lesions. Moderate thoracolumbar spondylosis. Mild chronic L1 vertebral compression fracture is stable since 10/19/2016 lumbar spine radiographs. IMPRESSION: 1. No acute abnormality. No evidence of bowel obstruction or acute bowel inflammation. No evidence of recurrent ventral abdominal hernia. 2. Moderate left colonic diverticulosis, with no evidence of acute diverticulitis. 3. Mildly enlarged prostate. 4. Infrarenal 3.2 cm Abdominal Aortic Aneurysm (ICD10-I71.9). Recommend follow-up aortic ultrasound in 3 years. This recommendation follows ACR consensus guidelines: White Paper of the ACR Incidental Findings Committee II on Vascular Findings. J Am Coll Radiol 2013; 96:283-662. 5.  Aortic Atherosclerosis (ICD10-I70.0). Electronically Signed   By: Ilona Sorrel M.D.   On: 02/28/2018 08:44   Result status: Edited Result - FINAL                              *[facility] [employer].                        [place], [place] [address]                            [phone]  ------------------------------------------------------------------- Transthoracic Echocardiography  (Report amended )  Patient:    Dink, Creps Alex #:       [id number] Study Date: 08/24/2016 Gender:     M Age:        4 Height:     180.3 cm Weight:     90.6 kg BSA:        2.15 m^2 Pt. Status: Room:   ATTENDING    Darlina Guys, MD  Willia Craze, Christopher  REFERRING    [place], [place], Outpatient  SONOGRAPHER  Roseanna Rainbow  cc:  ------------------------------------------------------------------- LV EF: 65% -   70%  ------------------------------------------------------------------- Indications:      Aortic stenosis 424.1.  ------------------------------------------------------------------- History:   PMH:  Amyloidosis.  Coronary artery  disease.  Angina pectoris.  Risk factors:  Hypertension. Dyslipidemia.  ------------------------------------------------------------------- Study Conclusions  - Left ventricle: The cavity size was normal. Wall thickness was   increased in a pattern of mild LVH. Systolic function was   vigorous. The estimated ejection fraction was in the range of 65%   to 70%. Wall motion was normal; there  were no regional wall   motion abnormalities. Doppler parameters are consistent with   abnormal left ventricular relaxation (grade 1 diastolic   dysfunction). - Aortic valve: Valve mobility was mildly restricted. - Aortic root: The aortic root was mildly dilated. - Mitral valve: Calcified annulus.  Impressions:  - Vigorous LV systolic function; mild diastolic dysfunction; mild   LVH; calcified aortic valve with no significant AS (peak velocity   2 m/s; mean gradient 9 mmHg); mildly dilated aortic root.    ASSESSMENT & PLAN:   78 y.o.  male with above-mentioned multiple medical comorbidities with  #1 Biopsy proven Renal and Systemic AL Amyloidosis Initial BM Bx shows 9% plasma cells and amyloid deposits. Echo shows normal systolic function with only grade 1 diastolic dysfunction. Skeletal survey with no lytic lesions suggestive of multiple myeloma. Serum K/L ratio increased at about 6.22 on diagnosis and has now improved to 1.5 (wnl with normal kappa lambda serum free light chain ratio ) UPEP shows 24h protein improved from 15g to 5g daily and the last recent one is about 7.3 g /24h  01/12/18 ECHO revealed LV EF of 60-65%   #2  Nephrotic Syndrome - likely related to AL amyloidosis.  Urine Study 07/26/17 showed Protein at 2.232g.  -07/19/17 the pt had a BM Bx exam which revealed Normocellular bone marrow (30-40%) with 2-3% plasma cells and focal amyloid deposition.  PLAN: -Discussed pt labwork today, 03/28/18; PLT increased to 107k, no anemia, WBC are normal -03/28/18 MMP and SFLC did not  reveal an M spike, and his last SFLC were WNL,  -Pt continues in remission  -Grade 1 neuropathy, will continue to watch this, the pt does not prefer to take any medications for this at this time -Continue Vitamin B complex  -Will continue repeat 24 hour urine study and repeat ECHO every 6 months  -The pt has no prohibitive toxicities from continuing Ninlaro on D1, D8 and D15 at this time.   -Continue Acyclovir - Pt will take Dexamethasone 8m, Zofran 4-883m Hydroxyzine- all 30 minutes before the Ninlaro pill.  -Recommended that the pt receive his annual flu vaccine, and continue follow up with his transplant team for his vaccinations  -Use anti-histamines as needed  -Will see the pt back in 2 months   #4 Patient Active Problem List   Diagnosis Date Noted  . Folliculitis 0745/80/9983. AL amyloidosis ([facility]04/29/2018  . Umbilical hernia 0738/25/0539. DIZZINESS 02/19/2010  . CAD, NATIVE VESSEL 07/21/2009  . CHEST PAIN-UNSPECIFIED 07/21/2009  . HYPERLIPIDEMIA 04/30/2007  . HYPERTENSION 04/30/2007  . GASTROESOPHAGEAL REFLUX DISEASE 04/30/2007  . SEIZURE DISORDER 04/30/2007  . COMPRESSION FRACTURE, L1 VERTEBRA 04/30/2007  . DIVERTICULOSIS, COLON, HX OF 04/30/2007  . Other postprocedural status(V45.89) 04/30/2007  -Continue follow-up with primary care physician for management of other medical co-morbidities. -Recommend if pt experiences spike fever to report to hospital with transplant team    Return to labs for Urine container for 24h UPEP RTC with Dr KaIrene Limboith labs in 2 months    All of the patients and his wife's questions were answered to their apparent satisfaction. They are agreeable with the plan as noted above .The patient knows to call the clinic with any problems, questions or concerns.  The total time spent in the appt was 25 minutes and more than 50% was on counseling and direct patient cares.     GaSullivan LoneD MSSmeltertownAHIVMS SCSouth Texas Rehabilitation HospitalTCommunity Memorial Hospitalematology/Oncology Physician CoMonterey(Office):  [phone] (Work cell):  [phone] (Fax):           [phone]  I, Baldwin Jamaica, am acting as a scribe for Dr. Sullivan Lone.   .I have reviewed the above documentation for accuracy and completeness, and I agree with the above. Brunetta Genera MD

## 2018-03-28 ENCOUNTER — Inpatient Hospital Stay (HOSPITAL_BASED_OUTPATIENT_CLINIC_OR_DEPARTMENT_OTHER): Payer: Medicare HMO | Admitting: Hematology

## 2018-03-28 ENCOUNTER — Inpatient Hospital Stay: Payer: Medicare HMO | Attending: Hematology

## 2018-03-28 ENCOUNTER — Telehealth: Payer: Self-pay | Admitting: Hematology

## 2018-03-28 VITALS — BP 117/77 | HR 68 | Temp 97.6°F | Resp 18 | Ht 71.0 in | Wt 208.1 lb

## 2018-03-28 DIAGNOSIS — I10 Essential (primary) hypertension: Secondary | ICD-10-CM | POA: Diagnosis not present

## 2018-03-28 DIAGNOSIS — Z87891 Personal history of nicotine dependence: Secondary | ICD-10-CM | POA: Diagnosis not present

## 2018-03-28 DIAGNOSIS — D696 Thrombocytopenia, unspecified: Secondary | ICD-10-CM

## 2018-03-28 DIAGNOSIS — E8581 Light chain (AL) amyloidosis: Secondary | ICD-10-CM

## 2018-03-28 DIAGNOSIS — Z79899 Other long term (current) drug therapy: Secondary | ICD-10-CM | POA: Insufficient documentation

## 2018-03-28 DIAGNOSIS — N049 Nephrotic syndrome with unspecified morphologic changes: Secondary | ICD-10-CM

## 2018-03-28 DIAGNOSIS — E854 Organ-limited amyloidosis: Secondary | ICD-10-CM

## 2018-03-28 DIAGNOSIS — N08 Glomerular disorders in diseases classified elsewhere: Secondary | ICD-10-CM

## 2018-03-28 LAB — CBC WITH DIFFERENTIAL/PLATELET
Abs Immature Granulocytes: 0.01 10*3/uL (ref 0.00–0.07)
Basophils Absolute: 0 10*3/uL (ref 0.0–0.1)
Basophils Relative: 0 %
EOS ABS: 0.1 10*3/uL (ref 0.0–0.5)
Eosinophils Relative: 2 %
HEMATOCRIT: 39.3 % (ref 39.0–52.0)
HEMOGLOBIN: 13.5 g/dL (ref 13.0–17.0)
IMMATURE GRANULOCYTES: 0 %
LYMPHS ABS: 1.5 10*3/uL (ref 0.7–4.0)
Lymphocytes Relative: 25 %
MCH: 31.6 pg (ref 26.0–34.0)
MCHC: 34.4 g/dL (ref 30.0–36.0)
MCV: 92 fL (ref 80.0–100.0)
MONOS PCT: 8 %
Monocytes Absolute: 0.5 10*3/uL (ref 0.1–1.0)
NEUTROS PCT: 65 %
Neutro Abs: 3.8 10*3/uL (ref 1.7–7.7)
Platelets: 107 10*3/uL — ABNORMAL LOW (ref 150–400)
RBC: 4.27 MIL/uL (ref 4.22–5.81)
RDW: 13.7 % (ref 11.5–15.5)
WBC: 6 10*3/uL (ref 4.0–10.5)
nRBC: 0 % (ref 0.0–0.2)

## 2018-03-28 LAB — CMP (CANCER CENTER ONLY)
ALBUMIN: 3.8 g/dL (ref 3.5–5.0)
ALK PHOS: 73 U/L (ref 38–126)
ALT: 25 U/L (ref 0–44)
ANION GAP: 9 (ref 5–15)
AST: 35 U/L (ref 15–41)
BUN: 21 mg/dL (ref 8–23)
CALCIUM: 9 mg/dL (ref 8.9–10.3)
CO2: 27 mmol/L (ref 22–32)
CREATININE: 1.47 mg/dL — AB (ref 0.61–1.24)
Chloride: 106 mmol/L (ref 98–111)
GFR, Est AFR Am: 53 mL/min — ABNORMAL LOW (ref >60)
GFR, Estimated: 46 mL/min — ABNORMAL LOW (ref >60)
GLUCOSE: 90 mg/dL (ref 70–99)
Potassium: 3.8 mmol/L (ref 3.5–5.1)
SODIUM: 142 mmol/L (ref 135–145)
Total Bilirubin: 0.8 mg/dL (ref 0.3–1.2)
Total Protein: 6.7 g/dL (ref 6.5–8.1)

## 2018-03-28 NOTE — Telephone Encounter (Signed)
Printed calendar and avs. °

## 2018-03-29 LAB — KAPPA/LAMBDA LIGHT CHAINS
KAPPA FREE LGHT CHN: 16.3 mg/L (ref 3.3–19.4)
Kappa, lambda light chain ratio: 1.15 (ref 0.26–1.65)
LAMDA FREE LIGHT CHAINS: 14.2 mg/L (ref 5.7–26.3)

## 2018-03-31 LAB — MULTIPLE MYELOMA PANEL, SERUM
Albumin SerPl Elph-Mcnc: 3.7 g/dL (ref 2.9–4.4)
Albumin/Glob SerPl: 1.5 (ref 0.7–1.7)
Alpha 1: 0.2 g/dL (ref 0.0–0.4)
Alpha2 Glob SerPl Elph-Mcnc: 0.8 g/dL (ref 0.4–1.0)
B-Globulin SerPl Elph-Mcnc: 0.9 g/dL (ref 0.7–1.3)
Gamma Glob SerPl Elph-Mcnc: 0.6 g/dL (ref 0.4–1.8)
Globulin, Total: 2.5 g/dL (ref 2.2–3.9)
IGM (IMMUNOGLOBULIN M), SRM: 31 mg/dL (ref 15–143)
IgA: 76 mg/dL (ref 61–437)
IgG (Immunoglobin G), Serum: 750 mg/dL (ref 700–1600)
Total Protein ELP: 6.2 g/dL (ref 6.0–8.5)

## 2018-04-11 DIAGNOSIS — D6959 Other secondary thrombocytopenia: Secondary | ICD-10-CM | POA: Diagnosis not present

## 2018-04-11 DIAGNOSIS — G629 Polyneuropathy, unspecified: Secondary | ICD-10-CM | POA: Diagnosis not present

## 2018-04-11 DIAGNOSIS — I129 Hypertensive chronic kidney disease with stage 1 through stage 4 chronic kidney disease, or unspecified chronic kidney disease: Secondary | ICD-10-CM | POA: Diagnosis not present

## 2018-04-11 DIAGNOSIS — Z87891 Personal history of nicotine dependence: Secondary | ICD-10-CM | POA: Diagnosis not present

## 2018-04-11 DIAGNOSIS — Z9484 Stem cells transplant status: Secondary | ICD-10-CM | POA: Diagnosis not present

## 2018-04-11 DIAGNOSIS — T451X5A Adverse effect of antineoplastic and immunosuppressive drugs, initial encounter: Secondary | ICD-10-CM | POA: Diagnosis not present

## 2018-04-11 DIAGNOSIS — I350 Nonrheumatic aortic (valve) stenosis: Secondary | ICD-10-CM | POA: Diagnosis not present

## 2018-04-11 DIAGNOSIS — E785 Hyperlipidemia, unspecified: Secondary | ICD-10-CM | POA: Diagnosis not present

## 2018-04-11 DIAGNOSIS — N189 Chronic kidney disease, unspecified: Secondary | ICD-10-CM | POA: Diagnosis not present

## 2018-04-11 DIAGNOSIS — I2510 Atherosclerotic heart disease of native coronary artery without angina pectoris: Secondary | ICD-10-CM | POA: Diagnosis not present

## 2018-04-11 DIAGNOSIS — E8581 Light chain (AL) amyloidosis: Secondary | ICD-10-CM | POA: Diagnosis not present

## 2018-04-19 ENCOUNTER — Telehealth: Payer: Self-pay | Admitting: *Deleted

## 2018-04-19 NOTE — Telephone Encounter (Signed)
Patient called. States physician at [facility] want to decrease Acyclovir from 800 mg BID to 400 mg BID. He wanted to make sure that dose change is ok with Dr. [name]. Per Dr. [name], patient should follow [facility] physician treatment plan for dose change. Contacted patient and gave information from Dr. [name]. Patient verbalized understanding and states he will get the new RX with lower dose filled.

## 2018-04-21 ENCOUNTER — Encounter (HOSPITAL_COMMUNITY): Payer: Self-pay | Admitting: Nurse Practitioner

## 2018-04-21 ENCOUNTER — Other Ambulatory Visit: Payer: Self-pay

## 2018-04-21 ENCOUNTER — Other Ambulatory Visit (HOSPITAL_COMMUNITY): Payer: Self-pay

## 2018-04-21 DIAGNOSIS — E8581 Light chain (AL) amyloidosis: Secondary | ICD-10-CM | POA: Insufficient documentation

## 2018-04-21 DIAGNOSIS — K219 Gastro-esophageal reflux disease without esophagitis: Secondary | ICD-10-CM | POA: Insufficient documentation

## 2018-04-21 DIAGNOSIS — M1990 Unspecified osteoarthritis, unspecified site: Secondary | ICD-10-CM | POA: Insufficient documentation

## 2018-04-21 DIAGNOSIS — T451X5A Adverse effect of antineoplastic and immunosuppressive drugs, initial encounter: Secondary | ICD-10-CM | POA: Diagnosis not present

## 2018-04-21 DIAGNOSIS — N183 Chronic kidney disease, stage 3 (moderate): Secondary | ICD-10-CM | POA: Insufficient documentation

## 2018-04-21 DIAGNOSIS — J189 Pneumonia, unspecified organism: Secondary | ICD-10-CM | POA: Insufficient documentation

## 2018-04-21 DIAGNOSIS — Z87891 Personal history of nicotine dependence: Secondary | ICD-10-CM | POA: Diagnosis not present

## 2018-04-21 DIAGNOSIS — I129 Hypertensive chronic kidney disease with stage 1 through stage 4 chronic kidney disease, or unspecified chronic kidney disease: Secondary | ICD-10-CM | POA: Diagnosis not present

## 2018-04-21 DIAGNOSIS — I350 Nonrheumatic aortic (valve) stenosis: Secondary | ICD-10-CM | POA: Insufficient documentation

## 2018-04-21 DIAGNOSIS — Z9484 Stem cells transplant status: Secondary | ICD-10-CM | POA: Insufficient documentation

## 2018-04-21 DIAGNOSIS — R0602 Shortness of breath: Secondary | ICD-10-CM | POA: Diagnosis not present

## 2018-04-21 DIAGNOSIS — R000 Tachycardia, unspecified: Secondary | ICD-10-CM | POA: Diagnosis not present

## 2018-04-21 DIAGNOSIS — Z8614 Personal history of Methicillin resistant Staphylococcus aureus infection: Secondary | ICD-10-CM | POA: Diagnosis not present

## 2018-04-21 DIAGNOSIS — J181 Lobar pneumonia, unspecified organism: Secondary | ICD-10-CM | POA: Diagnosis present

## 2018-04-21 DIAGNOSIS — A419 Sepsis, unspecified organism: Principal | ICD-10-CM | POA: Insufficient documentation

## 2018-04-21 DIAGNOSIS — I2510 Atherosclerotic heart disease of native coronary artery without angina pectoris: Secondary | ICD-10-CM | POA: Diagnosis not present

## 2018-04-21 DIAGNOSIS — D6959 Other secondary thrombocytopenia: Secondary | ICD-10-CM | POA: Diagnosis not present

## 2018-04-21 DIAGNOSIS — E785 Hyperlipidemia, unspecified: Secondary | ICD-10-CM | POA: Insufficient documentation

## 2018-04-21 DIAGNOSIS — E876 Hypokalemia: Secondary | ICD-10-CM | POA: Insufficient documentation

## 2018-04-21 DIAGNOSIS — N390 Urinary tract infection, site not specified: Secondary | ICD-10-CM | POA: Diagnosis not present

## 2018-04-21 NOTE — ED Triage Notes (Signed)
Pt is a cancer pt receiving chemo treatment, c/o shortness of breath, fever at home and burning with urination.

## 2018-04-22 ENCOUNTER — Encounter (HOSPITAL_COMMUNITY): Payer: Self-pay | Admitting: Family Medicine

## 2018-04-22 ENCOUNTER — Observation Stay (HOSPITAL_COMMUNITY)
Admission: EM | Admit: 2018-04-22 | Discharge: 2018-04-23 | Disposition: A | Discharge status: Home / Self Care | Payer: Medicare HMO | Attending: Internal Medicine | Admitting: Internal Medicine

## 2018-04-22 ENCOUNTER — Emergency Department (HOSPITAL_COMMUNITY): Payer: Medicare HMO

## 2018-04-22 ENCOUNTER — Other Ambulatory Visit: Payer: Self-pay

## 2018-04-22 DIAGNOSIS — I2510 Atherosclerotic heart disease of native coronary artery without angina pectoris: Secondary | ICD-10-CM | POA: Diagnosis present

## 2018-04-22 DIAGNOSIS — J189 Pneumonia, unspecified organism: Secondary | ICD-10-CM | POA: Diagnosis not present

## 2018-04-22 DIAGNOSIS — E8581 Light chain (AL) amyloidosis: Secondary | ICD-10-CM

## 2018-04-22 DIAGNOSIS — A419 Sepsis, unspecified organism: Secondary | ICD-10-CM | POA: Diagnosis present

## 2018-04-22 DIAGNOSIS — J181 Lobar pneumonia, unspecified organism: Secondary | ICD-10-CM | POA: Diagnosis not present

## 2018-04-22 DIAGNOSIS — I10 Essential (primary) hypertension: Secondary | ICD-10-CM | POA: Diagnosis not present

## 2018-04-22 DIAGNOSIS — K219 Gastro-esophageal reflux disease without esophagitis: Secondary | ICD-10-CM | POA: Diagnosis not present

## 2018-04-22 DIAGNOSIS — N390 Urinary tract infection, site not specified: Secondary | ICD-10-CM

## 2018-04-22 DIAGNOSIS — R0602 Shortness of breath: Secondary | ICD-10-CM | POA: Diagnosis not present

## 2018-04-22 LAB — URINALYSIS, ROUTINE W REFLEX MICROSCOPIC
Bilirubin Urine: NEGATIVE
Glucose, UA: NEGATIVE mg/dL
KETONES UR: 5 mg/dL — AB
Nitrite: NEGATIVE
PH: 5 (ref 5.0–8.0)
Protein, ur: 30 mg/dL — AB
SPECIFIC GRAVITY, URINE: 1.012 (ref 1.005–1.030)

## 2018-04-22 LAB — CG4 I-STAT (LACTIC ACID)
Lactic Acid, Venous: 1.5 mmol/L (ref 0.5–1.9)
Lactic Acid, Venous: 1.41 mmol/L (ref 0.5–1.9)

## 2018-04-22 LAB — CBC WITH DIFFERENTIAL/PLATELET
Abs Immature Granulocytes: 0.15 10*3/uL — ABNORMAL HIGH (ref 0.00–0.07)
BASOS PCT: 0 %
Basophils Absolute: 0 10*3/uL (ref 0.0–0.1)
EOS ABS: 0 10*3/uL (ref 0.0–0.5)
EOS PCT: 0 %
HCT: 41.7 % (ref 39.0–52.0)
Hemoglobin: 14 g/dL (ref 13.0–17.0)
Immature Granulocytes: 1 %
Lymphocytes Relative: 6 %
Lymphs Abs: 1.2 10*3/uL (ref 0.7–4.0)
MCH: 32.1 pg (ref 26.0–34.0)
MCHC: 33.6 g/dL (ref 30.0–36.0)
MCV: 95.6 fL (ref 80.0–100.0)
MONOS PCT: 6 %
Monocytes Absolute: 1.2 10*3/uL — ABNORMAL HIGH (ref 0.1–1.0)
Neutro Abs: 16.1 10*3/uL — ABNORMAL HIGH (ref 1.7–7.7)
Neutrophils Relative %: 87 %
PLATELETS: 95 10*3/uL — AB (ref 150–400)
RBC: 4.36 MIL/uL (ref 4.22–5.81)
RDW: 14 % (ref 11.5–15.5)
WBC: 18.6 10*3/uL — ABNORMAL HIGH (ref 4.0–10.5)
nRBC: 0 % (ref 0.0–0.2)

## 2018-04-22 LAB — TROPONIN I

## 2018-04-22 LAB — BASIC METABOLIC PANEL
Anion gap: 12 (ref 5–15)
BUN: 30 mg/dL — AB (ref 8–23)
CALCIUM: 8.9 mg/dL (ref 8.9–10.3)
CO2: 24 mmol/L (ref 22–32)
CREATININE: 1.4 mg/dL — AB (ref 0.61–1.24)
Chloride: 102 mmol/L (ref 98–111)
GFR calc Af Amer: 57 mL/min — ABNORMAL LOW (ref >60)
GFR calc non Af Amer: 49 mL/min — ABNORMAL LOW (ref >60)
Glucose, Bld: 111 mg/dL — ABNORMAL HIGH (ref 70–99)
POTASSIUM: 2.9 mmol/L — AB (ref 3.5–5.1)
Sodium: 138 mmol/L (ref 135–145)

## 2018-04-22 LAB — BRAIN NATRIURETIC PEPTIDE: B Natriuretic Peptide: 37.7 pg/mL (ref 0.0–100.0)

## 2018-04-22 LAB — INFLUENZA PANEL BY PCR (TYPE A & B)
INFLAPCR: NEGATIVE
INFLBPCR: NEGATIVE

## 2018-04-22 LAB — MAGNESIUM: Magnesium: 1.8 mg/dL (ref 1.7–2.4)

## 2018-04-22 MED ORDER — PANTOPRAZOLE SODIUM 40 MG PO TBEC
40.0000 mg | DELAYED_RELEASE_TABLET | Freq: Every day | ORAL | Status: DC
  Administered 2018-04-22 – 2018-04-23 (×2): 40 mg via ORAL
  Filled 2018-04-22 (×2): qty 1

## 2018-04-22 MED ORDER — HYDROXYZINE HCL 25 MG PO TABS: 25.0000 mg | ORAL_TABLET | Freq: Three times a day (TID) | ORAL | Status: DC | PRN

## 2018-04-22 MED ORDER — NITROGLYCERIN 0.4 MG SL SUBL: 0.4000 mg | SUBLINGUAL_TABLET | SUBLINGUAL | Status: DC | PRN

## 2018-04-22 MED ORDER — ATORVASTATIN CALCIUM 40 MG PO TABS
40.0000 mg | ORAL_TABLET | Freq: Every day | ORAL | Status: DC
  Administered 2018-04-22: 40 mg via ORAL
  Filled 2018-04-22: qty 1

## 2018-04-22 MED ORDER — ACYCLOVIR 400 MG PO TABS
400.0000 mg | ORAL_TABLET | Freq: Two times a day (BID) | ORAL | Status: DC
  Administered 2018-04-22 – 2018-04-23 (×2): 400 mg via ORAL
  Filled 2018-04-22 (×2): qty 1

## 2018-04-22 MED ORDER — SODIUM CHLORIDE 0.9 % IV SOLN
1.0000 g | INTRAVENOUS | Status: DC
  Administered 2018-04-23: 1 g via INTRAVENOUS
  Filled 2018-04-22: qty 1

## 2018-04-22 MED ORDER — SODIUM CHLORIDE 0.9 % IV SOLN
1.0000 g | Freq: Once | INTRAVENOUS | Status: AC
  Administered 2018-04-22: 1 g via INTRAVENOUS
  Filled 2018-04-22: qty 10

## 2018-04-22 MED ORDER — EZETIMIBE 10 MG PO TABS
10.0000 mg | ORAL_TABLET | Freq: Every day | ORAL | Status: DC
  Administered 2018-04-22 – 2018-04-23 (×2): 10 mg via ORAL
  Filled 2018-04-22 (×2): qty 1

## 2018-04-22 MED ORDER — PHENOL 1.4 % MT LIQD
1.0000 | OROMUCOSAL | Status: DC | PRN
  Administered 2018-04-22: 1 via OROMUCOSAL
  Filled 2018-04-22: qty 177

## 2018-04-22 MED ORDER — POTASSIUM CHLORIDE CRYS ER 20 MEQ PO TBCR
40.0000 meq | EXTENDED_RELEASE_TABLET | Freq: Once | ORAL | Status: AC
  Administered 2018-04-22: 40 meq via ORAL
  Filled 2018-04-22: qty 2

## 2018-04-22 MED ORDER — SODIUM CHLORIDE 0.45 % IV SOLN
INTRAVENOUS | Status: DC
  Administered 2018-04-22 – 2018-04-23 (×2): via INTRAVENOUS
  Filled 2018-04-22 (×5): qty 1000

## 2018-04-22 MED ORDER — ADULT MULTIVITAMIN W/MINERALS CH
1.0000 | ORAL_TABLET | Freq: Every day | ORAL | Status: DC
  Administered 2018-04-23: 1 via ORAL
  Filled 2018-04-22 (×2): qty 1

## 2018-04-22 MED ORDER — MUPIROCIN 2 % EX OINT: 1.0000 "application " | TOPICAL_OINTMENT | Freq: Every day | CUTANEOUS | Status: DC | PRN

## 2018-04-22 MED ORDER — FOLIC ACID 1 MG PO TABS
1.0000 mg | ORAL_TABLET | Freq: Every day | ORAL | Status: DC
  Administered 2018-04-22 – 2018-04-23 (×2): 1 mg via ORAL
  Filled 2018-04-22 (×2): qty 1

## 2018-04-22 MED ORDER — FAMOTIDINE 20 MG PO TABS
40.0000 mg | ORAL_TABLET | Freq: Every day | ORAL | Status: DC
  Administered 2018-04-22 – 2018-04-23 (×2): 40 mg via ORAL
  Filled 2018-04-22 (×2): qty 2

## 2018-04-22 MED ORDER — SODIUM CHLORIDE 0.9 % IV SOLN
500.0000 mg | Freq: Once | INTRAVENOUS | Status: AC
  Administered 2018-04-22: 500 mg via INTRAVENOUS
  Filled 2018-04-22: qty 500

## 2018-04-22 MED ORDER — ISOSORBIDE MONONITRATE ER 30 MG PO TB24
15.0000 mg | ORAL_TABLET | Freq: Every day | ORAL | Status: DC
  Administered 2018-04-22 – 2018-04-23 (×2): 15 mg via ORAL
  Filled 2018-04-22 (×2): qty 1

## 2018-04-22 MED ORDER — MAGNESIUM SULFATE 2 GM/50ML IV SOLN
2.0000 g | Freq: Once | INTRAVENOUS | Status: AC
  Administered 2018-04-22: 2 g via INTRAVENOUS
  Filled 2018-04-22: qty 50

## 2018-04-22 MED ORDER — SODIUM CHLORIDE 0.9 % IV BOLUS
1000.0000 mL | Freq: Once | INTRAVENOUS | Status: AC
  Administered 2018-04-22: 1000 mL via INTRAVENOUS

## 2018-04-22 MED ORDER — BOOST / RESOURCE BREEZE PO LIQD CUSTOM
1.0000 | Freq: Once | ORAL | Status: AC
  Administered 2018-04-22: 1 via ORAL
  Filled 2018-04-22: qty 1

## 2018-04-22 MED ORDER — AZITHROMYCIN 250 MG PO TABS
250.0000 mg | ORAL_TABLET | Freq: Every day | ORAL | Status: DC
  Administered 2018-04-23: 250 mg via ORAL
  Filled 2018-04-22: qty 1

## 2018-04-22 MED ORDER — PROCHLORPERAZINE MALEATE 10 MG PO TABS: 10.0000 mg | ORAL_TABLET | Freq: Four times a day (QID) | ORAL | Status: DC | PRN

## 2018-04-22 MED ORDER — TRIAMCINOLONE ACETONIDE 0.1 % EX CREA: 1.0000 "application " | TOPICAL_CREAM | CUTANEOUS | Status: DC | PRN

## 2018-04-22 MED ORDER — LORATADINE 10 MG PO TABS
10.0000 mg | ORAL_TABLET | Freq: Every day | ORAL | Status: DC
  Administered 2018-04-22 – 2018-04-23 (×2): 10 mg via ORAL
  Filled 2018-04-22 (×2): qty 1

## 2018-04-22 MED ORDER — ONDANSETRON HCL 4 MG PO TABS: 8.0000 mg | ORAL_TABLET | Freq: Two times a day (BID) | ORAL | Status: DC | PRN

## 2018-04-22 NOTE — ED Notes (Signed)
ED TO INPATIENT HANDOFF REPORT  Name/Age/Gender Alex [name]. 77 y.o. male  Code Status   Home/SNF/Other Home  Chief Complaint sob   Level of Care/Admitting Diagnosis ED Disposition    ED Disposition Condition [place]: [place] [100102]  Level of Care: Telemetry [5]  Admit to tele based on following criteria: Other see comments  Comments: hypokalemia  Diagnosis: Pneumonia [227785]  Admitting Physician: Shela Leff [[id number]]  Attending Physician: Shela Leff [[id number]]  PT Class (Do Not Modify): Observation [104]  PT Acc Code (Do Not Modify): Observation [10022]       Medical History Past Medical History:  Diagnosis Date  . Amyloidosis ([place])   . Arthritis   . Basal cell carcinoma   . CAD (coronary artery disease)    Lexiscan Myoview 6/14: Normal study, no scar or ischemia, EF 62%  . Cataract   . Chronic kidney disease   . Colon polyp   . Coronary artery disease    moderate disease by cath 2011  . Diverticulosis of colon   . GERD (gastroesophageal reflux disease)   . Hearing loss   . Heart disease   . Hiatal hernia   . Hyperlipidemia   . Hypertension   . MRSA (methicillin resistant staph aureus) culture positive   . Seizure disorder ([place])   . Seizures ([place])    last in 1970s    Allergies Allergies  Allergen Reactions  . Ramipril Cough    IV Location/Drains/Wounds Patient Lines/Drains/Airways Status   Active Line/Drains/Airways    Name:   Placement date:   Placement time:   Site:   Days:   Peripheral IV 04/22/18 Left;Upper Arm   04/22/18    0300    Arm   less than 1          Labs/Imaging Results for orders placed or performed during the hospital encounter of 04/22/18 (from the past 48 hour(s))  Basic metabolic panel     Status: Abnormal   Collection Time: 04/22/18  3:00 AM  Result Value Ref Range   Sodium 138 135 - 145 mmol/L   Potassium 2.9 (L) 3.5 - 5.1 mmol/L   Chloride  102 98 - 111 mmol/L   CO2 24 22 - 32 mmol/L   Glucose, Bld 111 (H) 70 - 99 mg/dL   BUN 30 (H) 8 - 23 mg/dL   Creatinine, Ser 1.40 (H) 0.61 - 1.24 mg/dL   Calcium 8.9 8.9 - 10.3 mg/dL   GFR calc non Af Amer 49 (L) >60 mL/min   GFR calc Af Amer 57 (L) >60 mL/min   Anion gap 12 5 - 15    Comment: Performed at [facility], [place] [address], [place], [place] [address]  Troponin I - Once     Status: None   Collection Time: 04/22/18  3:00 AM  Result Value Ref Range   Troponin I <0.03 <0.03 ng/mL    Comment: Performed at [facility], [place] [address] [place], [place] [address]  CBC with Differential     Status: Abnormal   Collection Time: 04/22/18  3:00 AM  Result Value Ref Range   WBC 18.6 (H) 4.0 - 10.5 K/uL   RBC 4.36 4.22 - 5.81 MIL/uL   Hemoglobin 14.0 13.0 - 17.0 g/dL   HCT 41.7 39.0 - 52.0 %   MCV 95.6 80.0 - 100.0 fL   MCH 32.1 26.0 - 34.0 pg   MCHC 33.6 30.0 -  36.0 g/dL   RDW 14.0 11.5 - 15.5 %   Platelets 95 (L) 150 - 400 K/uL    Comment: REPEATED TO VERIFY PLATELET COUNT CONFIRMED BY SMEAR SPECIMEN CHECKED FOR CLOTS Immature Platelet Fraction may be clinically indicated, consider ordering this additional test [id number]    nRBC 0.0 0.0 - 0.2 %   Neutrophils Relative % 87 %   Neutro Abs 16.1 (H) 1.7 - 7.7 K/uL   Lymphocytes Relative 6 %   Lymphs Abs 1.2 0.7 - 4.0 K/uL   Monocytes Relative 6 %   Monocytes Absolute 1.2 (H) 0.1 - 1.0 K/uL   Eosinophils Relative 0 %   Eosinophils Absolute 0.0 0.0 - 0.5 K/uL   Basophils Relative 0 %   Basophils Absolute 0.0 0.0 - 0.1 K/uL   Immature Granulocytes 1 %   Abs Immature Granulocytes 0.15 (H) 0.00 - 0.07 K/uL    Comment: Performed at [facility], [place] [address], [place], [place] [address]  Brain natriuretic peptide     Status: None   Collection Time: 04/22/18  3:00 AM  Result Value Ref Range   B Natriuretic Peptide 37.7 0.0 - 100.0 pg/mL    Comment: Performed at [facility], [place] [address], [place], [place] [address]  CG4 I-STAT (Lactic acid)     Status: None   Collection Time: 04/22/18  3:06 AM  Result Value Ref Range   Lactic Acid, Venous 1.50 0.5 - 1.9 mmol/L  CG4 I-STAT (Lactic acid)     Status: None   Collection Time: 04/22/18  4:53 AM  Result Value Ref Range   Lactic Acid, Venous 1.41 0.5 - 1.9 mmol/L  Influenza panel by PCR (type A & B)     Status: None   Collection Time: 04/22/18  5:00 AM  Result Value Ref Range   Influenza A By PCR NEGATIVE NEGATIVE   Influenza B By PCR NEGATIVE NEGATIVE    Comment: (NOTE) The Xpert Xpress Flu assay is intended as an aid in the diagnosis of  influenza and should not be used as a sole basis for treatment.  This  assay is FDA approved for nasopharyngeal swab specimens only. Nasal  washings and aspirates are unacceptable for Xpert Xpress Flu testing. Performed at [facility], [place] [address], [place], [place] [address]   Magnesium     Status: None   Collection Time: 04/22/18  5:00 AM  Result Value Ref Range   Magnesium 1.8 1.7 - 2.4 mg/dL    Comment: Performed at [facility], [place] [address], [place], [place] [address]  Urinalysis, Routine w reflex microscopic     Status: Abnormal   Collection Time: 04/22/18  5:01 AM  Result Value Ref Range   Color, Urine YELLOW YELLOW   APPearance CLEAR CLEAR   Specific Gravity, Urine 1.012 1.005 - 1.030   pH 5.0 5.0 - 8.0   Glucose, UA NEGATIVE NEGATIVE mg/dL   Hgb urine dipstick SMALL (A) NEGATIVE   Bilirubin Urine NEGATIVE NEGATIVE   Ketones, ur 5 (A) NEGATIVE mg/dL   Protein, ur 30 (A) NEGATIVE mg/dL   Nitrite NEGATIVE NEGATIVE   Leukocytes, UA SMALL (A) NEGATIVE   RBC / HPF 0-5 0 - 5 RBC/hpf   WBC, UA 21-50 0 - 5 WBC/hpf   Bacteria, UA MANY (A) NONE SEEN   Mucus PRESENT     Comment: Performed at [facility], [place] [address], [place], [place] [address]  Dg Chest 2 View  Result  Date: 04/22/2018 CLINICAL DATA:  Acute onset of shortness of breath and fever. Dysuria. Patient on chemotherapy. EXAM: CHEST - 2 VIEW COMPARISON:  Chest radiograph and CTA of the chest performed 11/16/2016 FINDINGS: The lungs are well-aerated. Mild left basilar airspace opacity may reflect atelectasis or mild pneumonia. There is no evidence of pleural effusion or pneumothorax. The heart is normal in size; the mediastinal contour is within normal limits. No acute osseous abnormalities are seen. IMPRESSION: Mild left basilar airspace opacity may reflect atelectasis or mild pneumonia. Electronically Signed   By: Garald Balding M.D.   On: 04/22/2018 02:34   None  Pending Labs Unresulted Labs (From admission, onward)    Start     Ordered   04/22/18 0212  Urine culture  ONCE - STAT,   STAT     04/22/18 0212   04/22/18 0212  Culture, blood (routine x 2)  BLOOD CULTURE X 2,   STAT     04/22/18 0212   Signed and Held  Magnesium  Tomorrow morning,   R     Signed and Held   Signed and Held  Basic metabolic panel  Tomorrow morning,   R     Signed and Held   Signed and Held  CBC  Tomorrow morning,   R     Signed and Held   Signed and Held  Culture, sputum-assessment  Once,   R     Signed and Held   Signed and Held  Gram stain  Once,   R     Signed and Held   Signed and Held  HIV antibody (Routine Screening)  Tomorrow morning,   R     Signed and Held   Signed and Held  Strep pneumoniae urinary antigen  Once,   R     Signed and Held   Signed and Held  Legionella Pneumophila Serogp 1 Ur Ag  Once,   R     Signed and Held          Vitals/Pain Today's Vitals   04/22/18 0900 04/22/18 1000 04/22/18 1053 04/22/18 1124  BP: 124/80 117/63 132/86   Pulse: 83  84   Resp: (!) 23 (!) 22 (!) 22   Temp:      TempSrc:      SpO2: 94%  95%   Weight:      Height:      PainSc:    0-No pain    Isolation Precautions No active isolations  Medications Medications  cefTRIAXone (ROCEPHIN) 1 g in sodium  chloride 0.9 % 100 mL IVPB (0 g Intravenous Stopped 04/22/18 0534)  azithromycin (ZITHROMAX) 500 mg in sodium chloride 0.9 % 250 mL IVPB (0 mg Intravenous Stopped 04/22/18 0429)  sodium chloride 0.9 % bolus 1,000 mL (0 mLs Intravenous Stopped 04/22/18 0429)  feeding supplement (BOOST / RESOURCE BREEZE) liquid 1 Container (1 Container Oral Given 04/22/18 0429)  potassium chloride SA (K-DUR,KLOR-CON) CR tablet 40 mEq (40 mEq Oral Given 04/22/18 0511)    Mobility walks

## 2018-04-22 NOTE — H&P (Signed)
History and Physical   Alex [name]. DTO:[id number] DOB: November 28, 1940 DOA: 04/22/2018  Referring MD/NP/PA: Dr. Regenia [name], [place] PCP: Alex Redwood, MD Outpatient Specialists: Oncology, Dr. Irene [name] and Dr. Melburn [name]  Patient coming from: Home  Chief Complaint: Cough, dyspnea, fever, urinary frequency  HPI: Alex Crate. is a 77 y.o. male with a history of AL amyloidosis s/p stem cell transplant Dec 2018 on Ninlaro since July 2019 who presented to the ED on the insistence of his wife for increasing lethargy, cough, dyspnea, low grade fever. She assists with history which begins 5-6 days previously when they attended a family party and each developed some sore throat, very mild cough afterwards. They've been more busy than usual, and he's been acting more weak since that time, staying in the chair for longer stretches than usual. He's had a mild cough and admits to mild dyspnea, though his wife feels he's clearly much more weak than usual. He recently also admitted to increasing frequency/urgency with urination which is new.    ED Course: WBC 18.6, platelets stable at 95, flu negative, K 2.9, creatinine stable at 1.40. He appeared tachypneic, 94% on room air with CXR demonstrating opacity at the left base favored to represent infiltrate in this clinical setting. Urinalysis also suggestive of infection. Ceftriaxone and azithromycin provided and due to continued weakness and infection in a patient undergoing active chemotherapy, admission requested. His wife feels his current weakness makes her unable to care for him at home currently. He is placed into observation.  Review of Systems: Subjective fever, weakness, as above. No chest pain, palpitations. No abdominal pain, dysuria, N/V/D, and per HPI. All others reviewed and are negative.   Past Medical History:  Diagnosis Date  . Amyloidosis ([place])   . Arthritis   . Basal cell carcinoma   . CAD (coronary artery disease)    Lexiscan  Myoview 6/14: Normal study, no scar or ischemia, EF 62%  . Cataract   . Chronic kidney disease   . Colon polyp   . Coronary artery disease    moderate disease by cath 2011  . Diverticulosis of colon   . GERD (gastroesophageal reflux disease)   . Hearing loss   . Heart disease   . Hiatal hernia   . Hyperlipidemia   . Hypertension   . MRSA (methicillin resistant staph aureus) culture positive   . Seizure disorder ([place])   . Seizures ([place])    last in 1970s   Past Surgical History:  Procedure Laterality Date  . BUNIONECTOMY    . CARPAL TUNNEL RELEASE  1994   left  . CERVICAL FUSION    . compression fracture    . FOOT SURGERY     right -twice, left foot once  . HERNIA REPAIR     Umbilical with PVP  . KNEE ARTHROSCOPY     left knee  . [place]   left - scope  . NASAL SEPTUM SURGERY    . NECK SURGERY    . NOSE SURGERY     twice  . PAROTIDECTOMY Right 03/09/2017  . ROTATOR CUFF REPAIR     twice, both shoulders  . ROTATOR CUFF REPAIR    . SHOULDER SURGERY    . SKIN BIOPSY    . SPINE SURGERY     C2, C3, C4  . TRIGGER FINGER RELEASE     both hands, twice  . TRIGGER FINGER RELEASE    . VASECTOMY     -  Former remote smoker. Lives with wife, has pets.   reports that he quit smoking about 49 years ago. He has quit using smokeless tobacco. He reports current alcohol use of about 7.0 - 14.0 standard drinks of alcohol per week. He reports that he does not use drugs. Allergies  Allergen Reactions  . Ramipril Cough   Family History  Problem Relation Age of Onset  . Diabetes Father   . Heart disease Father   . Heart attack Father 23  . Hypertension Father   . Cancer Sister   . Hearing loss Mother   . Eczema Sister   . Hyperlipidemia Other   . Diabetes Other   . Hypertension Other   . Seizures Other   . Thyroid disease Other   . Colon cancer Neg Hx   . Stroke Neg Hx    - Family history otherwise reviewed and not pertinent.  Prior to Admission medications     Medication Sig Start Date End Date Taking? Authorizing Provider  acyclovir (ZOVIRAX) 800 MG tablet Take 800 mg by mouth 2 (two) times Alex.   Yes [provider]  atorvastatin (LIPITOR) 40 MG tablet Take 40 mg by mouth at bedtime.    Yes [provider]  cetirizine (ZYRTEC ALLERGY) 10 MG tablet Take 1 tablet (10 mg total) by mouth Alex. 10/30/17  Yes Tanner, Lyndon Code., [name]  ezetimibe (ZETIA) 10 MG tablet Take 10 mg by mouth Alex.  04/24/17  Yes [provider]  famotidine (PEPCID) 20 MG tablet Take 2 tablets (40 mg total) by mouth Alex. 10/30/17  Yes Tanner, Lyndon Code., [name]  folic acid (FOLVITE) 1 MG tablet Take 1 mg by mouth Alex.  04/24/17  Yes [provider]  hydrOXYzine (ATARAX/VISTARIL) 25 MG tablet Take 1 tablet (25 mg total) by mouth every 8 (eight) hours as needed for itching. 10/30/17  Yes Tanner, Lyndon Code., [name]  isosorbide mononitrate (IMDUR) 30 MG 24 hr tablet Take 15 mg by mouth Alex.  12/07/16  Yes [provider]  ixazomib citrate (NINLARO) 3 MG capsule Take 1 capsule (3mg ) by mouth once weekly for 3 weeks on, 1 week off, repeat Q 4 weeks. Take on empty stomach 1hr before or 2hrs after food. 02/20/18  Yes Brunetta Genera, MD  losartan (COZAAR) 25 MG tablet Take 25 mg by mouth Alex.   Yes [provider]  Multiple Vitamin (MULTI-VITAMINS) TABS Take 1 tablet by mouth Alex. 04/24/17  Yes [provider]  mupirocin ointment (BACTROBAN) 2 % Apply to skin lesion 3 times Alex. Patient taking differently: Apply 1 application topically Alex as needed (wound care).  03/12/18  Yes Tanner, Lyndon Code., [name]  nitroGLYCERIN (NITROSTAT) 0.4 MG SL tablet Place 0.4 mg under the tongue every 5 (five) minutes as needed for chest pain.   Yes [provider]  omeprazole (PRILOSEC) 20 MG capsule Take 20 mg by mouth Alex.     Yes [provider]  ondansetron (ZOFRAN) 8 MG tablet Take 8 mg by mouth 2 (two) times Alex as needed  for nausea or vomiting.   Yes [provider]  potassium chloride SA (K-DUR,KLOR-CON) 20 MEQ tablet Take 20 mEq by mouth at bedtime.    Yes [provider]  prochlorperazine (COMPAZINE) 10 MG tablet Take 1 tablet (10 mg total) by mouth every 6 (six) hours as needed for nausea or vomiting. 03/12/18  Yes Tanner, Lyndon Code., [name]  triamcinolone cream (KENALOG) 0.1 % Apply 1 application topically as needed  for rash.   Yes [provider]    Physical Exam: Vitals:   04/22/18 0511 04/22/18 0553 04/22/18 0701 04/22/18 0855  BP: 124/79 124/79 (!) 99/56 124/81  Pulse: (!) 106 100 89 87  Resp: 14 (!) 22 18 18   Temp: 99.3 F (37.4 C)     TempSrc: Oral     SpO2: 96% 96% 96% 96%  Weight:      Height:       Constitutional: Tired-appearing elderly male in no distress, calm demeanor Eyes: Lids and conjunctivae normal, PERRL ENMT: Mucous membranes are moist. Posterior pharynx clear of any exudate or lesions. Fair dentition.  Neck: normal, supple, no masses, no thyromegaly Respiratory: Non-labored tachypnea with SpO2 92-96% on room air at rest without accessory muscle use. Mildly decreased at bases, otherwise clear breath sounds to auscultation bilaterally Cardiovascular: Regular rate and rhythm, Harsh holosystolic murmur at base, no rubs, or gallops. No carotid bruits. No JVD. No LE edema. Palpable pedal pulses. Abdomen: Normoactive bowel sounds. No tenderness, non-distended, and no masses palpated. No hepatosplenomegaly. GU: No indwelling catheter Musculoskeletal: No clubbing / cyanosis. No joint deformity upper and lower extremities. Good ROM, no contractures. Normal muscle tone.  Skin: Warm, dry. No rashes, wounds, no ulcers.   Neurologic: CN II-XII grossly intact. Gait not assessed. Speech normal. Appears diffusely weak, but no focal deficits in motor strength or sensation in all extremities.  Psychiatric: Alert and oriented x3, gives sparse history. No AVH, SI, HI.  Labs on  Admission: I have personally reviewed following labs and imaging studies  CBC: Recent Labs  Lab 04/22/18 0300  WBC 18.6*  NEUTROABS 16.1*  HGB 14.0  HCT 41.7  MCV 95.6  PLT 95*   Basic Metabolic Panel: Recent Labs  Lab 04/22/18 0300 04/22/18 0500  NA 138  --   K 2.9*  --   CL 102  --   CO2 24  --   GLUCOSE 111*  --   BUN 30*  --   CREATININE 1.40*  --   CALCIUM 8.9  --   MG  --  1.8   GFR: Estimated Creatinine Clearance: 52.7 mL/min (A) (by C-G formula based on SCr of 1.4 mg/dL (H)). Liver Function Tests: No results for input(s): AST, ALT, ALKPHOS, BILITOT, PROT, ALBUMIN in the last 168 hours. No results for input(s): LIPASE, AMYLASE in the last 168 hours. No results for input(s): AMMONIA in the last 168 hours. Coagulation Profile: No results for input(s): INR, PROTIME in the last 168 hours. Cardiac Enzymes: Recent Labs  Lab 04/22/18 0300  TROPONINI <0.03   BNP (last 3 results) No results for input(s): PROBNP in the last 8760 hours. HbA1C: No results for input(s): HGBA1C in the last 72 hours. CBG: No results for input(s): GLUCAP in the last 168 hours. Lipid Profile: No results for input(s): CHOL, HDL, LDLCALC, TRIG, CHOLHDL, LDLDIRECT in the last 72 hours. Thyroid Function Tests: No results for input(s): TSH, T4TOTAL, FREET4, T3FREE, THYROIDAB in the last 72 hours. Anemia Panel: No results for input(s): VITAMINB12, FOLATE, FERRITIN, TIBC, IRON, RETICCTPCT in the last 72 hours. Urine analysis:    Component Value Date/Time   COLORURINE YELLOW 04/22/2018 0501   APPEARANCEUR CLEAR 04/22/2018 0501   LABSPEC 1.012 04/22/2018 0501   PHURINE 5.0 04/22/2018 0501   GLUCOSEU NEGATIVE 04/22/2018 0501   HGBUR SMALL (A) 04/22/2018 0501   BILIRUBINUR NEGATIVE 04/22/2018 0501   KETONESUR 5 (A) 04/22/2018 0501   PROTEINUR 30 (A) 04/22/2018 0501   NITRITE  NEGATIVE 04/22/2018 0501   LEUKOCYTESUR SMALL (A) 04/22/2018 0501    No results found for this or any previous  visit (from the past 240 hour(s)).   Radiological Exams on Admission: Dg Chest 2 View  Result Date: 04/22/2018 CLINICAL DATA:  Acute onset of shortness of breath and fever. Dysuria. Patient on chemotherapy. EXAM: CHEST - 2 VIEW COMPARISON:  Chest radiograph and CTA of the chest performed 11/16/2016 FINDINGS: The lungs are well-aerated. Mild left basilar airspace opacity may reflect atelectasis or mild pneumonia. There is no evidence of pleural effusion or pneumothorax. The heart is normal in size; the mediastinal contour is within normal limits. No acute osseous abnormalities are seen. IMPRESSION: Mild left basilar airspace opacity may reflect atelectasis or mild pneumonia. Electronically Signed   By: Garald Balding M.D.   On: 04/22/2018 02:34    EKG: Independently reviewed. Sinus tachycardia, leftward axis, no ischemic ST-T changes noted.  Assessment/Plan Active Problems:   Pneumonia   Sepsis due to LLL pneumonia: Lactic acid wnl, though with tachypnea and leukocytosis the patient qualifies for sepsis. Flu negative. - Continue ceftriaxone, azithromycin - Urine antigens - Monitor blood cultures, add sputum culture  Symptomatic pyuria:  - Continue abx as above, urine culture sent  Hypokalemia: Poor po intake.  - Replace. Mg 1.8, will supplement and recheck in AM  Stage III CKD: Based on CrCl 56ml/min which appears to be near baseline.  - Avoid nephrotoxins - Gentle IVF due to infection/mild sepsis  AL amyloidosis:  - Continue home ninlaro on schedule and prophylactic acyclovir - Follow up as outpatient with oncology  HTN:  - Continue home imdur. Will hold losartan for today.  HLD:  - Continue statin, zetia  GERD:  - Continue acid suppression  Chemotherapy-induced thrombocytopenia: Stable - Monitor. SCDs for DVT ppx  Aortic stenosis:  - Follow up with Dr. Julianne Handler. Echo report from Sept 2019 reviewed.  DVT prophylaxis: SCDs  Code Status: Full confirmed at admission    Family Communication: Wife at bedside Disposition Plan: Anticipate return to home once infection treated, will get therapy evaluations Consults called: None  Admission status: Observation    Patrecia Pour, MD [facility] [email or web address] Password [facility] 04/22/2018, 9:26 AM

## 2018-04-22 NOTE — ED Provider Notes (Signed)
[facility] DEPT Provider Note   CSN: [id number] Arrival date & time: 04/21/18  2323     History   Chief Complaint Chief Complaint  Patient presents with  . Shortness of Breath  . Dysuria    HPI Alex [name]. is a 77 y.o. male.  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  He states that this all started on 12/28.  He developed a low-grade fever of 100.4 and took 2 Tylenol.  He has had a little bit of cough.  No pain save for a sore throat.  He and his wife both had a sore throat earlier in the week that then resolved.  He has chronic rhinorrhea but no new rhinorrhea.  He states he is been having dysuria and frequency but no feelings of urinary retention.  No abdominal pain.  Past Medical History:  Diagnosis Date  . Amyloidosis ([place])   . Arthritis   . Basal cell carcinoma   . CAD (coronary artery disease)    Lexiscan Myoview 6/14: Normal study, no scar or ischemia, EF 62%  . Cataract   . Chronic kidney disease   . Colon polyp   . Coronary artery disease    moderate disease by cath 2011  . Diverticulosis of colon   . GERD (gastroesophageal reflux disease)   . Hearing loss   . Heart disease   . Hiatal hernia   . Hyperlipidemia   . Hypertension   . MRSA (methicillin resistant staph aureus) culture positive   . Seizure disorder ([place])   . Seizures ([place])    last in 1970s    Patient Active Problem List   Diagnosis Date Noted  . Pneumonia 04/22/2018  . Folliculitis 83/41/9622  . AL amyloidosis ([place]) 08/21/2016  . Umbilical hernia 29/79/8921  . DIZZINESS 02/19/2010  . CAD, NATIVE VESSEL 07/21/2009  . CHEST PAIN-UNSPECIFIED 07/21/2009  . HYPERLIPIDEMIA 04/30/2007  . HYPERTENSION 04/30/2007  . GASTROESOPHAGEAL REFLUX DISEASE 04/30/2007  . SEIZURE DISORDER 04/30/2007  . COMPRESSION FRACTURE, L1 VERTEBRA 04/30/2007  . DIVERTICULOSIS, COLON,  HX OF 04/30/2007  . Other postprocedural status(V45.89) 04/30/2007    Past Surgical History:  Procedure Laterality Date  . BUNIONECTOMY    . CARPAL TUNNEL RELEASE  1994   left  . CERVICAL FUSION    . compression fracture    . FOOT SURGERY     right -twice, left foot once  . HERNIA REPAIR     Umbilical with PVP  . KNEE ARTHROSCOPY     left knee  . [place]   left - scope  . NASAL SEPTUM SURGERY    . NECK SURGERY    . NOSE SURGERY     twice  . PAROTIDECTOMY Right 03/09/2017  . ROTATOR CUFF REPAIR     twice, both shoulders  . ROTATOR CUFF REPAIR    . SHOULDER SURGERY    . SKIN BIOPSY    . SPINE SURGERY     C2, C3, C4  . TRIGGER FINGER RELEASE     both hands, twice  . TRIGGER FINGER RELEASE    . VASECTOMY          Home Medications    Prior to Admission medications   Medication Sig Start Date End Date Taking? Authorizing Provider  acyclovir (ZOVIRAX) 800 MG tablet Take 800 mg by mouth 2 (two) times daily.   Yes [provider]  atorvastatin (LIPITOR) 40 MG tablet Take 40 mg by mouth at bedtime.    Yes [provider]  cetirizine (ZYRTEC ALLERGY) 10 MG tablet Take 1 tablet (10 mg total) by mouth daily. 10/30/17  Yes Tanner, Lyndon Code., [name]  ezetimibe (ZETIA) 10 MG tablet Take 10 mg by mouth daily.  04/24/17  Yes [provider]  famotidine (PEPCID) 20 MG tablet Take 2 tablets (40 mg total) by mouth daily. 10/30/17  Yes Tanner, Lyndon Code., [name]  folic acid (FOLVITE) 1 MG tablet Take 1 mg by mouth daily.  04/24/17  Yes [provider]  hydrOXYzine (ATARAX/VISTARIL) 25 MG tablet Take 1 tablet (25 mg total) by mouth every 8 (eight) hours as needed for itching. 10/30/17  Yes Tanner, Lyndon Code., [name]  isosorbide mononitrate (IMDUR) 30 MG 24 hr tablet Take 15 mg by mouth daily.  12/07/16  Yes [provider]  ixazomib citrate (NINLARO) 3 MG capsule Take 1 capsule (3mg ) by mouth once weekly for 3 weeks on, 1 week off, repeat Q 4 weeks. Take  on empty stomach 1hr before or 2hrs after food. 02/20/18  Yes Brunetta Genera, MD  losartan (COZAAR) 25 MG tablet Take 25 mg by mouth daily.   Yes [provider]  Multiple Vitamin (MULTI-VITAMINS) TABS Take 1 tablet by mouth daily. 04/24/17  Yes [provider]  mupirocin ointment (BACTROBAN) 2 % Apply to skin lesion 3 times daily. Patient taking differently: Apply 1 application topically daily as needed (wound care).  03/12/18  Yes Tanner, Lyndon Code., [name]  nitroGLYCERIN (NITROSTAT) 0.4 MG SL tablet Place 0.4 mg under the tongue every 5 (five) minutes as needed for chest pain.   Yes [provider]  omeprazole (PRILOSEC) 20 MG capsule Take 20 mg by mouth daily.     Yes [provider]  ondansetron (ZOFRAN) 8 MG tablet Take 8 mg by mouth 2 (two) times daily as needed for nausea or vomiting.   Yes [provider]  potassium chloride SA (K-DUR,KLOR-CON) 20 MEQ tablet Take 20 mEq by mouth at bedtime.    Yes [provider]  prochlorperazine (COMPAZINE) 10 MG tablet Take 1 tablet (10 mg total) by mouth every 6 (six) hours as needed for nausea or vomiting. 03/12/18  Yes Tanner, Lyndon Code., [name]  triamcinolone cream (KENALOG) 0.1 % Apply 1 application topically as needed for rash.   Yes [provider]    Family History Family History  Problem Relation Age of Onset  . Diabetes Father   . Heart disease Father   . Heart attack Father 64  . Hypertension Father   . Cancer Sister   . Hearing loss Mother   . Eczema Sister   . Hyperlipidemia Other   . Diabetes Other   . Hypertension Other   . Seizures Other   . Thyroid disease Other   . Colon cancer Neg Hx   . Stroke Neg Hx     Social History Social History   Tobacco Use  . Smoking status: Former Smoker    Last attempt to quit: 04/25/1969    Years since quitting: 49.0  . Smokeless tobacco: Former Network engineer Use Topics  . Alcohol use: Yes    Alcohol/week: 7.0 - 14.0 standard  drinks    Types: 7 - 14 Glasses of wine per week    Comment: socially shots of wiskey  . Drug use: No     Allergies   Ramipril   Review of  Systems Review of Systems  Constitutional: Positive for fever.  HENT: Positive for sore throat.   Respiratory: Positive for cough and shortness of breath.   Cardiovascular: Negative for chest pain.  Gastrointestinal: Negative for abdominal pain and vomiting.  Genitourinary: Positive for dysuria and frequency.  Musculoskeletal: Negative for myalgias.  All other systems reviewed and are negative.    Physical Exam Updated Vital Signs BP (!) 99/56 (BP Location: Left Arm)   Pulse 89   Temp 99.3 F (37.4 C) (Oral)   Resp 18   Ht 5\' 10"  (1.778 m)   Wt 94.8 kg   SpO2 96%   BMI 29.99 kg/m   Physical Exam Vitals signs and nursing note reviewed.  Constitutional:      Appearance: He is well-developed.  HENT:     Head: Normocephalic and atraumatic.     Right Ear: External ear normal.     Left Ear: External ear normal.     Nose: Nose normal.     Mouth/Throat:     Pharynx: No oropharyngeal exudate.  Eyes:     General:        Right eye: No discharge.        Left eye: No discharge.  Neck:     Musculoskeletal: Neck supple.  Cardiovascular:     Rate and Rhythm: Normal rate and regular rhythm.     Heart sounds: Normal heart sounds.  Pulmonary:     Effort: Pulmonary effort is normal. Tachypnea present. No accessory muscle usage or respiratory distress.     Breath sounds: Normal breath sounds.  Abdominal:     Palpations: Abdomen is soft.     Tenderness: There is no abdominal tenderness.  Skin:    General: Skin is warm and dry.  Neurological:     Mental Status: He is alert.  Psychiatric:        Mood and Affect: Mood is not anxious.      ED Treatments / Results  Labs (all labs ordered are listed, but only abnormal results are displayed) Labs Reviewed  BASIC METABOLIC PANEL - Abnormal; Notable for the following components:       Result Value   Potassium 2.9 (*)    Glucose, Bld 111 (*)    BUN 30 (*)    Creatinine, Ser 1.40 (*)    GFR calc non Af Amer 49 (*)    GFR calc Af Amer 57 (*)    All other components within normal limits  CBC WITH DIFFERENTIAL/PLATELET - Abnormal; Notable for the following components:   WBC 18.6 (*)    Platelets 95 (*)    Neutro Abs 16.1 (*)    Monocytes Absolute 1.2 (*)    Abs Immature Granulocytes 0.15 (*)    All other components within normal limits  URINALYSIS, ROUTINE W REFLEX MICROSCOPIC - Abnormal; Notable for the following components:   Hgb urine dipstick SMALL (*)    Ketones, ur 5 (*)    Protein, ur 30 (*)    Leukocytes, UA SMALL (*)    Bacteria, UA MANY (*)    All other components within normal limits  URINE CULTURE  CULTURE, BLOOD (ROUTINE X 2)  CULTURE, BLOOD (ROUTINE X 2)  TROPONIN I  BRAIN NATRIURETIC PEPTIDE  INFLUENZA PANEL BY PCR (TYPE A & B)  MAGNESIUM  I-STAT CG4 LACTIC ACID, ED  CG4 I-STAT (LACTIC ACID)  I-STAT CG4 LACTIC ACID, ED  CG4 I-STAT (LACTIC ACID)    EKG None  Radiology Dg Chest 2  View  Result Date: 04/22/2018 CLINICAL DATA:  Acute onset of shortness of breath and fever. Dysuria. Patient on chemotherapy. EXAM: CHEST - 2 VIEW COMPARISON:  Chest radiograph and CTA of the chest performed 11/16/2016 FINDINGS: The lungs are well-aerated. Mild left basilar airspace opacity may reflect atelectasis or mild pneumonia. There is no evidence of pleural effusion or pneumothorax. The heart is normal in size; the mediastinal contour is within normal limits. No acute osseous abnormalities are seen. IMPRESSION: Mild left basilar airspace opacity may reflect atelectasis or mild pneumonia. Electronically Signed   By: Garald Balding M.D.   On: 04/22/2018 02:34    Procedures Procedures (including critical care time)  Medications Ordered in ED Medications  cefTRIAXone (ROCEPHIN) 1 g in sodium chloride 0.9 % 100 mL IVPB (0 g Intravenous Stopped 04/22/18 0534)    azithromycin (ZITHROMAX) 500 mg in sodium chloride 0.9 % 250 mL IVPB (0 mg Intravenous Stopped 04/22/18 0429)  sodium chloride 0.9 % bolus 1,000 mL (0 mLs Intravenous Stopped 04/22/18 0429)  feeding supplement (BOOST / RESOURCE BREEZE) liquid 1 Container (1 Container Oral Given 04/22/18 0429)  potassium chloride SA (K-DUR,KLOR-CON) CR tablet 40 mEq (40 mEq Oral Given 04/22/18 0511)     Initial Impression / Assessment and Plan / ED Course  I have reviewed the triage vital signs and the nursing notes.  Pertinent labs & imaging results that were available during my care of the patient were reviewed by me and considered in my medical decision making (see chart for details).     Patient is overall well-appearing except mildly tachypneic.  With his chest x-ray findings in combination of low-grade fever I favor pneumonia over atelectasis and he has been started on Rocephin and azithromycin.  This should also cover the urinary tract infection seen.  Given he is immunosuppressed on his oral chemotherapy I think he will need admission for observation and treatment.  He will also have his potassium replaced.  Final Clinical Impressions(s) / ED Diagnoses   Final diagnoses:  Community acquired pneumonia of left lower lobe of lung ([place])  Acute urinary tract infection    ED Discharge Orders    None       Sherwood Gambler, MD 04/22/18 [id number]

## 2018-04-22 NOTE — ED Notes (Signed)
Meal provided to pt and family

## 2018-04-23 DIAGNOSIS — N390 Urinary tract infection, site not specified: Secondary | ICD-10-CM | POA: Diagnosis not present

## 2018-04-23 DIAGNOSIS — D696 Thrombocytopenia, unspecified: Secondary | ICD-10-CM

## 2018-04-23 DIAGNOSIS — I10 Essential (primary) hypertension: Secondary | ICD-10-CM | POA: Diagnosis not present

## 2018-04-23 DIAGNOSIS — E8581 Light chain (AL) amyloidosis: Secondary | ICD-10-CM | POA: Diagnosis not present

## 2018-04-23 DIAGNOSIS — G4733 Obstructive sleep apnea (adult) (pediatric): Secondary | ICD-10-CM | POA: Diagnosis not present

## 2018-04-23 DIAGNOSIS — J181 Lobar pneumonia, unspecified organism: Secondary | ICD-10-CM | POA: Diagnosis not present

## 2018-04-23 DIAGNOSIS — J189 Pneumonia, unspecified organism: Secondary | ICD-10-CM | POA: Diagnosis not present

## 2018-04-23 DIAGNOSIS — A419 Sepsis, unspecified organism: Secondary | ICD-10-CM | POA: Diagnosis not present

## 2018-04-23 LAB — BASIC METABOLIC PANEL
Anion gap: 10 (ref 5–15)
BUN: 21 mg/dL (ref 8–23)
CO2: 21 mmol/L — ABNORMAL LOW (ref 22–32)
Calcium: 7.8 mg/dL — ABNORMAL LOW (ref 8.9–10.3)
Chloride: 106 mmol/L (ref 98–111)
Creatinine, Ser: 1.34 mg/dL — ABNORMAL HIGH (ref 0.61–1.24)
GFR calc Af Amer: 60 mL/min — ABNORMAL LOW (ref >60)
GFR calc non Af Amer: 51 mL/min — ABNORMAL LOW (ref >60)
Glucose, Bld: 113 mg/dL — ABNORMAL HIGH (ref 70–99)
Potassium: 3.4 mmol/L — ABNORMAL LOW (ref 3.5–5.1)
Sodium: 137 mmol/L (ref 135–145)

## 2018-04-23 LAB — CBC
HCT: 34.2 % — ABNORMAL LOW (ref 39.0–52.0)
Hemoglobin: 11.3 g/dL — ABNORMAL LOW (ref 13.0–17.0)
MCH: 31.9 pg (ref 26.0–34.0)
MCHC: 33 g/dL (ref 30.0–36.0)
MCV: 96.6 fL (ref 80.0–100.0)
Platelets: UNDETERMINED 10*3/uL (ref 150–400)
RBC: 3.54 MIL/uL — ABNORMAL LOW (ref 4.22–5.81)
RDW: 14.6 % (ref 11.5–15.5)
WBC: 15.2 10*3/uL — ABNORMAL HIGH (ref 4.0–10.5)
nRBC: 0 % (ref 0.0–0.2)

## 2018-04-23 LAB — HIV ANTIBODY (ROUTINE TESTING W REFLEX): HIV Screen 4th Generation wRfx: NONREACTIVE

## 2018-04-23 LAB — MAGNESIUM: Magnesium: 2.3 mg/dL (ref 1.7–2.4)

## 2018-04-23 LAB — STREP PNEUMONIAE URINARY ANTIGEN: STREP PNEUMO URINARY ANTIGEN: NEGATIVE

## 2018-04-23 MED ORDER — POTASSIUM CHLORIDE 20 MEQ PO PACK
40.0000 meq | PACK | Freq: Once | ORAL | Status: AC
  Administered 2018-04-23: 40 meq via ORAL
  Filled 2018-04-23: qty 2

## 2018-04-23 MED ORDER — AZITHROMYCIN 250 MG PO TABS: 250.0000 mg | ORAL_TABLET | Freq: Every day | ORAL | 0 refills | Status: AC

## 2018-04-23 MED ORDER — CEFDINIR 300 MG PO CAPS: 300.0000 mg | ORAL_CAPSULE | Freq: Two times a day (BID) | ORAL | Status: DC

## 2018-04-23 MED ORDER — CEFDINIR 300 MG PO CAPS: 300.0000 mg | ORAL_CAPSULE | Freq: Two times a day (BID) | ORAL | 0 refills | Status: AC

## 2018-04-23 NOTE — Care Management Note (Signed)
Case Management Note  Patient Details  Name: Alex [name]. MRN: [id number] Date of Birth: 08/12/1940  Subjective/Objective:                  discharged  Action/Plan: 3 in one commode/pt and ot through [facility]  Expected Discharge Date:  04/23/18               Expected Discharge Plan:  [place]  In-House Referral:     Discharge planning Services  CM Consult  Post Acute Care Choice:  Durable Medical Equipment, Home Health Choice offered to:     DME Arranged:  3-N-1 DME Agency:  [facility] Arranged:  PT, OT [facility] Agency:  [place]  Status of Service:  Completed, signed off  If discussed at [place] of Stay Meetings, dates discussed:    Additional Comments:  Leeroy Cha, RN 04/23/2018, 4:17 PM

## 2018-04-23 NOTE — Discharge Summary (Addendum)
Discharge Summary  Alex [name]. RCV:[id number] DOB: 31-Aug-1940  PCP: [name], MD  Admit date: 04/22/2018 Discharge date: 04/23/2018   Time spent: < 25 minutes  Admitted From: Home Disposition: Home  Recommendations for Outpatient Follow-up:  1. Follow up with PCP in 1 week 2. Complete Azithromycin and Cefdinir therapy (8 days) 3. Follow up urine culture to ensure sensitivities 4. Repeat BMP(Cr, K) and CBC (platelets) at PCP follow up  5. Home health PT and OT arranged on discharge. Bedside commode, rolling walker 6.     Discharge Diagnoses:  Active Hospital Problems   Diagnosis Date Noted  . Left lower lobe pneumonia ([place]) 04/22/2018  . Sepsis due to pneumonia ([place]) 04/22/2018  . AL amyloidosis ([place]) 08/21/2016  . CAD, NATIVE VESSEL 07/21/2009  . GASTROESOPHAGEAL REFLUX DISEASE 04/30/2007  . Essential hypertension 04/30/2007    Resolved Hospital Problems  No resolved problems to display.    Discharge Condition: Stable  CODE STATUS: Full  History of present illness:  Alex [name]. is a 77 y.o. year old male with medical history significant for AL amyloidosis (status post stem cell transplant on December 2018) who presented on 04/22/2018 with a week of cough, lethargy, shortness of breath and low-grade fever and he was found to have left lower lobe pneumonia and UTI.  Both he and his wife initially developed sore throat with mild cough afterwards.  Since then he has become progressively more weak, more short of breath with activity as well as increasing frequency/urgency with urination.  Upon initial ED presentation patient had T-max 99.8, respiratory rate of 21-23, normal oxygen saturation, heart rate range 80-106, blood pressure range 99/56-125/85.  Complains of shortness of breath flu panel was obtained which was negative.  He had transient hypotension for lactic acid was within normal limits at 1.5 and 1.4 with trending.  BMP was notable for  potassium of 2.9, creatinine of 1.4) baseline 1.5-1.7) his UA was remarkable for small leukocytes, WBC 21-50 and many bacteria, urine culture showed greater than 100,000 GNR.  Additional work-up included troponin negative at less than 0.03, elevated white count of 18.6 on CBC with platelets of 95.  BNP 37 blood cultures x2 were obtained. Chest x-ray show mild left basilar opacity with concern for mild pneumonia.  He was started on IV azithromycin and IV ceftriaxone and [facility] hospitalist was called for further management.  Remaining hospital course addressed in problem based format below:   Hospital Course:   Sepsis secondary to community-acquired pneumonia.  On admission was tachypneic to the mid 20s, tachycardic to low 100s, with white count of 18.  Chest x-ray confirmed left lower lobe opacity.  Empirically started on IV azithromycin and IV ceftriaxone.  Blood cultures remained negative, with improvement of leukocytosis from 18- to 15 within 24 hours.  Did not require oxygen supplementation, passed ambulatory oxygen saturation test on discharge.  Significant improvement in cough and dyspnea within 24 hours of antibiotic therapy.  Will continue azithromycin and cefdinir on discharge for total of 8 days  UTI.  Presented with frequency/urgency and dysuria.  UA consistent with infection and urine culture shows gram-negative rods.  Responded well to IV ceftriaxone during admission transition to cefdinir on discharge.  Can follow-up urine culture to ensure sensitivity.  Generalized weakness.  Likely related to infection from above.  PT and OT evaluated during hospital stay recommended home health therapy which was arranged prior to discharge.  Hypokalemia.  Resolved with supplementation during hospital stay.  Stage  III CKD, stable.  Remained at baseline during hospital stay.  Hypertension.  losartan briefly held during observation period.  Should be able to resume at home given no changes in kidney  function during stay.  Advise BMP checked by PCP on hospital follow-up.  Chronic thrombocytopenia.  Remained at baseline during observation.  No gross signs of sepsis or bleeding.  On discharge unable to calculate due to platelet clumping.  Advise CBC check on hospital follow-up with PCP  Consultations:  None  Procedures/Studies: None  Discharge Exam: BP 140/88 (BP Location: Right Arm)   Pulse 97   Temp 98.6 F (37 C) (Oral)   Resp 20   Ht 5\' 10"  (1.778 m)   Wt 94.8 kg   SpO2 99%   BMI 29.99 kg/m   General: Lying in bed, no apparent distress Eyes: EOMI, anicteric ENT: Oral Mucosa clear and moist Cardiovascular: regular rate and rhythm, no murmurs, rubs or gallops, no edema, Respiratory: Normal respiratory effort on room air, lungs clear to auscultation bilaterally Abdomen: soft, non-distended, non-tender, normal bowel sounds Skin: No Rash Neurologic: Grossly no focal neuro deficit. Mental status AAOx3, speech normal, Psychiatric:Appropriate affect, and mood   Discharge Instructions You were cared for by a hospitalist during your hospital stay. If you have any questions about your discharge medications or the care you received while you were in the hospital after you are discharged, you can call the unit and asked to speak with the hospitalist on call if the hospitalist that took care of you is not available. Once you are discharged, your primary care physician will handle any further medical issues. Please note that NO REFILLS for any discharge medications will be authorized once you are discharged, as it is imperative that you return to your primary care physician (or establish a relationship with a primary care physician if you do not have one) for your aftercare needs so that they can reassess your need for medications and monitor your lab values.  Discharge Instructions    Diet - low sodium heart healthy   Complete by:  As directed    Increase activity slowly   Complete  by:  As directed      Allergies as of 04/23/2018      Reactions   Ramipril Cough      Medication List    TAKE these medications   acyclovir 800 MG tablet Commonly known as:  ZOVIRAX Take 800 mg by mouth 2 (two) times daily.   atorvastatin 40 MG tablet Commonly known as:  LIPITOR Take 40 mg by mouth at bedtime.   azithromycin 250 MG tablet Commonly known as:  ZITHROMAX Take 1 tablet (250 mg total) by mouth daily for 3 days. Start taking on:  April 24, 2018   cefdinir 300 MG capsule Commonly known as:  OMNICEF Take 1 capsule (300 mg total) by mouth every 12 (twelve) hours for 8 days. Start taking on:  April 24, 2018   cetirizine 10 MG tablet Commonly known as:  ZYRTEC ALLERGY Take 1 tablet (10 mg total) by mouth daily.   ezetimibe 10 MG tablet Commonly known as:  ZETIA Take 10 mg by mouth daily.   famotidine 20 MG tablet Commonly known as:  PEPCID Take 2 tablets (40 mg total) by mouth daily.   folic acid 1 MG tablet Commonly known as:  FOLVITE Take 1 mg by mouth daily.   hydrOXYzine 25 MG tablet Commonly known as:  ATARAX/VISTARIL Take 1 tablet (25 mg total) by  mouth every 8 (eight) hours as needed for itching.   isosorbide mononitrate 30 MG 24 hr tablet Commonly known as:  IMDUR Take 15 mg by mouth daily.   ixazomib citrate 3 MG capsule Commonly known as:  NINLARO Take 1 capsule (3mg ) by mouth once weekly for 3 weeks on, 1 week off, repeat Q 4 weeks. Take on empty stomach 1hr before or 2hrs after food.   losartan 25 MG tablet Commonly known as:  COZAAR Take 25 mg by mouth daily.   MULTI-VITAMINS Tabs Take 1 tablet by mouth daily.   mupirocin ointment 2 % Commonly known as:  BACTROBAN Apply to skin lesion 3 times daily. What changed:    how much to take  how to take this  when to take this  reasons to take this  additional instructions   nitroGLYCERIN 0.4 MG SL tablet Commonly known as:  NITROSTAT Place 0.4 mg under the tongue  every 5 (five) minutes as needed for chest pain.   omeprazole 20 MG capsule Commonly known as:  PRILOSEC Take 20 mg by mouth daily.   ondansetron 8 MG tablet Commonly known as:  ZOFRAN Take 8 mg by mouth 2 (two) times daily as needed for nausea or vomiting.   potassium chloride SA 20 MEQ tablet Commonly known as:  K-DUR,KLOR-CON Take 20 mEq by mouth at bedtime.   prochlorperazine 10 MG tablet Commonly known as:  COMPAZINE Take 1 tablet (10 mg total) by mouth every 6 (six) hours as needed for nausea or vomiting.   triamcinolone cream 0.1 % Commonly known as:  KENALOG Apply 1 application topically as needed for rash.            Durable Medical Equipment  (From admission, onward)         Start     Ordered   04/23/18 1512  For home use only DME Bedside commode  Once    Question:  Patient needs a bedside commode to treat with the following condition  Answer:  Weakness   04/23/18 1511         Allergies  Allergen Reactions  . Ramipril Cough      The results of significant diagnostics from this hospitalization (including imaging, microbiology, ancillary and laboratory) are listed below for reference.    Significant Diagnostic Studies: Dg Chest 2 View  Result Date: 04/22/2018 CLINICAL DATA:  Acute onset of shortness of breath and fever. Dysuria. Patient on chemotherapy. EXAM: CHEST - 2 VIEW COMPARISON:  Chest radiograph and CTA of the chest performed 11/16/2016 FINDINGS: The lungs are well-aerated. Mild left basilar airspace opacity may reflect atelectasis or mild pneumonia. There is no evidence of pleural effusion or pneumothorax. The heart is normal in size; the mediastinal contour is within normal limits. No acute osseous abnormalities are seen. IMPRESSION: Mild left basilar airspace opacity may reflect atelectasis or mild pneumonia. Electronically Signed   By: Garald Balding M.D.   On: 04/22/2018 02:34    Microbiology: Recent Results (from the past 240 hour(s))    Culture, blood (routine x 2)     Status: None (Preliminary result)   Collection Time: 04/22/18  3:00 AM  Result Value Ref Range Status   Specimen Description   Final    BLOOD LEFT ANTECUBITAL Performed at [facility] [address], [place], [place] [address]    Special Requests   Final    BOTTLES DRAWN AEROBIC AND ANAEROBIC Blood Culture results may not be optimal due to an excessive  volume of blood received in culture bottles Performed at [facility] [address], [place], [place] [address]    Culture   Final    NO GROWTH 1 DAY Performed at [facility], [place] [address], [place], [place] [address]    Report Status PENDING  Incomplete  Culture, blood (routine x 2)     Status: None (Preliminary result)   Collection Time: 04/22/18  3:00 AM  Result Value Ref Range Status   Specimen Description   Final    BLOOD RIGHT HAND Performed at [facility] [address], [place], [place] [address]    Special Requests   Final    BOTTLES DRAWN AEROBIC AND ANAEROBIC Blood Culture adequate volume Performed at [facility] [address] [place], [place] [address]    Culture   Final    NO GROWTH 1 DAY Performed at [facility], [place] [address] [place], [place] [address]    Report Status PENDING  Incomplete  Urine culture     Status: Abnormal (Preliminary result)   Collection Time: 04/22/18  5:01 AM  Result Value Ref Range Status   Specimen Description URINE, RANDOM  Final   Special Requests   Final    NONE Performed at [facility] [address], [place],  [address]    Culture >=100,000 COLONIES/mL GRAM NEGATIVE RODS (A)  Final   Report Status PENDING  Incomplete     Labs: Basic Metabolic Panel: Recent Labs  Lab 04/22/18 0300 04/22/18 0500 04/23/18 0549  NA 138  --  137  K 2.9*  --  3.4*  CL 102  --  106  CO2 24  --  21*  GLUCOSE 111*  --  113*  BUN 30*   --  21  CREATININE 1.40*  --  1.34*  CALCIUM 8.9  --  7.8*  MG  --  1.8 2.3   Liver Function Tests: No results for input(s): AST, ALT, ALKPHOS, BILITOT, PROT, ALBUMIN in the last 168 hours. No results for input(s): LIPASE, AMYLASE in the last 168 hours. No results for input(s): AMMONIA in the last 168 hours. CBC: Recent Labs  Lab 04/22/18 0300 04/23/18 0549  WBC 18.6* 15.2*  NEUTROABS 16.1*  --   HGB 14.0 11.3*  HCT 41.7 34.2*  MCV 95.6 96.6  PLT 95* PLATELET CLUMPS NOTED ON SMEAR, UNABLE TO ESTIMATE   Cardiac Enzymes: Recent Labs  Lab 04/22/18 0300  TROPONINI <0.03   BNP: BNP (last 3 results) Recent Labs    04/22/18 0300  BNP 37.7    ProBNP (last 3 results) No results for input(s): PROBNP in the last 8760 hours.  CBG: No results for input(s): GLUCAP in the last 168 hours.     Signed:  Desiree Hane, MD [facility] 04/23/2018, 7:07 PM

## 2018-04-23 NOTE — Evaluation (Signed)
Physical Therapy Evaluation Patient Details Name: Alex [name]. MRN: [id number] DOB: 11-05-1940 Today's Date: 04/23/2018   History of Present Illness  Beren Yniguez. is a 77 y.o. 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  Clinical Impression  The patient  Tolerated ambulating x 400' using Rw. Patient should progresss to requiring no Assistive device. O2 saturation on Ra 96% while ambulating. Pt admitted with above diagnosis. Pt currently with functional limitations due to the deficits listed below (see PT Problem List). Pt will benefit from skilled PT to increase their independence and safety with mobility to allow discharge to the venue listed below.       Follow Up Recommendations No PT follow up    Equipment Recommendations  None recommended by PT    Recommendations for Other Services       Precautions / Restrictions Precautions Precautions: Fall      Mobility  Bed Mobility Overal bed mobility: Independent                Transfers Overall transfer level: Needs assistance Equipment used: Rolling walker (2 wheeled) Transfers: Sit to/from Stand Sit to Stand: Min guard         General transfer comment: patient noted with slight increased effort to stand from bed initially.   Ambulation/Gait Ambulation/Gait assistance: Min guard Gait Distance (Feet): 400 Feet Assistive device: Rolling walker (2 wheeled) Gait Pattern/deviations: Step-through pattern     General Gait Details: patient ambulated with barely use of Rw. In room ambulated 10' without RW.   Stairs            Wheelchair Mobility    Modified Rankin (Stroke Patients Only)       Balance Overall balance assessment: Mild deficits observed, not formally tested                                           Pertinent  Vitals/Pain Pain Assessment: No/denies pain    Home Living Family/patient expects to be discharged to:: Private residence Living Arrangements: Spouse/significant other Available Help at Discharge: Family Type of Home: House Home Access: Stairs to enter   Technical brewer of Steps: 1 Home Layout: One level Home Equipment: None      Prior Function Level of Independence: Independent               Hand Dominance        Extremity/Trunk Assessment        Lower Extremity Assessment Lower Extremity Assessment: Generalized weakness    Cervical / Trunk Assessment Cervical / Trunk Assessment: Normal  Communication   Communication: No difficulties  Cognition Arousal/Alertness: Awake/alert Behavior During Therapy: WFL for tasks assessed/performed Overall Cognitive Status: Within Functional Limits for tasks assessed                                        General Comments      Exercises     Assessment/Plan    PT Assessment Patient needs continued PT services  PT Problem List Decreased strength;Decreased activity tolerance;Decreased mobility       PT Treatment Interventions Gait training;Functional mobility training;Therapeutic activities  PT Goals (Current goals can be found in the Care Plan section)  Acute Rehab PT Goals Patient Stated Goal: to go home PT Goal Formulation: With patient Time For Goal Achievement: 04/30/18 Potential to Achieve Goals: Good    Frequency Min 3X/week   Barriers to discharge        Co-evaluation               AM-PAC PT "6 Clicks" Mobility  Outcome Measure Help needed turning from your back to your side while in a flat bed without using bedrails?: None Help needed moving from lying on your back to sitting on the side of a flat bed without using bedrails?: None Help needed moving to and from a bed to a chair (including a wheelchair)?: A Little Help needed standing up from a chair using your arms  (e.g., wheelchair or bedside chair)?: A Little Help needed to walk in hospital room?: A Little Help needed climbing 3-5 steps with a railing? : A Little 6 Click Score: 20    End of Session Equipment Utilized During Treatment: Gait belt Activity Tolerance: Patient tolerated treatment well Patient left: in chair;with call bell/phone within reach;with chair alarm set Nurse Communication: Mobility status PT Visit Diagnosis: Unsteadiness on feet (R26.81)    Time: 0354-6568 PT Time Calculation (min) (ACUTE ONLY): 33 min   Charges:   PT Evaluation $PT Eval Low Complexity: 1 Low PT Treatments $Gait Training: 8-22 mins        [place] Pager [phone] Office [phone]   Claretha Cooper 04/23/2018, 10:27 AM

## 2018-04-23 NOTE — Progress Notes (Signed)
Occupational Therapy Evaluation Patient Details Name: Alex [name]. MRN: [id number] DOB: May 21, 1940 Today's Date: 04/23/2018    History of Present Illness Alex [name]. is a 77 y.o. 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   Clinical Impression   PTA, pt independent with ADL and mobility without an AD and drove/active in community. Pt requires min A with ambulation without an AD due to deficits listed below. Fatigues easily with ADL tasks with 2/4 dyspnea, however O2 Sats 97 on RA. Feel pt is a fall risk and is not at his baseline. Recommend HHOT to follow up to maximize independence/return to PLOF and  reduce risk of falls. Wife also requesting for therapy follow up as she does not feel her husband is at his baseline. Educated pt/wife on strategies to reduce risk of falls. All further OT to be addressed in next setting.     Follow Up Recommendations  Home health OT;Supervision/Assistance - 24 hour    Equipment Recommendations  3 in 1 bedside commode;Other (comment)(pt plans to borrow RW from church)    Recommendations for Other Services       Precautions / Restrictions Precautions Precautions: Fall      Mobility Bed Mobility Overal bed mobility: Modified Independent                Transfers Overall transfer level: Needs assistance   Transfers: Sit to/from Stand Sit to Stand: Min guard         General transfer comment: increased effort to stand; initialt posterior lean and fall back toward bed    Balance Overall balance assessment: Needs assistance   Sitting balance-Leahy Scale: Good       Standing balance-Leahy Scale: Fair                             ADL either performed or assessed with clinical judgement   ADL Overall ADL's : Needs assistance/impaired             Lower Body  Bathing: Min guard;Sit to/from stand   Upper Body Dressing : Set up   Lower Body Dressing: Min guard;Sit to/from stand   Toilet Transfer: Min guard;Ambulation Toilet Transfer Details (indicate cue type and reason): LOB noted by crossing feet when walking to bed; reaching to stabilize self on bed rail         Functional mobility during ADLs: Min guard General ADL Comments: Overall set up for ADL tasks; Recommend using a shower chair to reduce risk of falls. Pt with increased stability when using a RW for funcitoinal mobility; recommend using RW in house and rollator in community; educated pt/wife on reducing risk of falls     Vision         Perception     Praxis      Pertinent Vitals/Pain Pain Assessment: No/denies pain     Hand Dominance     Extremity/Trunk Assessment Upper Extremity Assessment Upper Extremity Assessment: Generalized weakness   Lower Extremity Assessment Lower Extremity Assessment: Defer to PT evaluation   Cervical / Trunk Assessment Cervical / Trunk Assessment: Normal;Other exceptions(forward head)   Communication Communication Communication: No difficulties   Cognition Arousal/Alertness: Awake/alert Behavior During Therapy: WFL for tasks assessed/performed Overall Cognitive Status: Within Functional Limits for tasks assessed  General Comments: decreased awareness at times; twisting self up in IV line   General Comments       Exercises Exercises: Other exercises Other Exercises Other Exercises: general UB strengthening with theraband - level 2`   Shoulder Instructions      Home Living Family/patient expects to be discharged to:: Private residence Living Arrangements: Spouse/significant other Available Help at Discharge: Family Type of Home: House Home Access: Stairs to enter Technical brewer of Steps: 1   Home Layout: One level     Bathroom Shower/Tub: Radiographer, therapeutic: Handicapped height Bathroom Accessibility: Yes How Accessible: Accessible via walker Home Equipment: None          Prior Functioning/Environment Level of Independence: Independent                 OT Problem List: Decreased strength;Decreased activity tolerance;Impaired balance (sitting and/or standing);Decreased knowledge of use of DME or AE;Cardiopulmonary status limiting activity      OT Treatment/Interventions:      OT Goals(Current goals can be found in the care plan section) Acute Rehab OT Goals Patient Stated Goal: " [place] home" OT Goal Formulation: With patient/family  OT Frequency:     Barriers to D/C:            Co-evaluation              AM-PAC OT "6 Clicks" Daily Activity     Outcome Measure Help from another person eating meals?: None Help from another person taking care of personal grooming?: None Help from another person toileting, which includes using toliet, bedpan, or urinal?: A Little Help from another person bathing (including washing, rinsing, drying)?: A Little Help from another person to put on and taking off regular upper body clothing?: None Help from another person to put on and taking off regular lower body clothing?: A Little 6 Click Score: 21   End of Session Equipment Utilized During Treatment: Gait belt;Rolling walker Nurse Communication: Mobility status;Other (comment)(DC needs)  Activity Tolerance: Patient tolerated treatment well Patient left: in chair;with call bell/phone within reach;with family/visitor present  OT Visit Diagnosis: Unsteadiness on feet (R26.81);Muscle weakness (generalized) (M62.81)                Time: 6256-3893 OT Time Calculation (min): 32 min Charges:  OT General Charges $OT Visit: 1 Visit OT Evaluation $OT Eval Low Complexity: 1 Low OT Treatments $Self Care/Home Management : 8-22 mins  Maurie Boettcher, OT/L   Acute OT Clinical Specialist Acute Rehabilitation Services Pager  [phone] Office [phone]   [facility] 04/23/2018, 2:59 PM

## 2018-04-24 ENCOUNTER — Other Ambulatory Visit: Payer: Self-pay

## 2018-04-24 ENCOUNTER — Encounter (HOSPITAL_COMMUNITY): Payer: Self-pay | Admitting: Emergency Medicine

## 2018-04-24 ENCOUNTER — Emergency Department (HOSPITAL_COMMUNITY): Payer: Medicare HMO

## 2018-04-24 ENCOUNTER — Emergency Department (HOSPITAL_COMMUNITY)
Admission: EM | Admit: 2018-04-24 | Discharge: 2018-04-24 | Disposition: A | Discharge status: Home / Self Care | Payer: Medicare HMO | Attending: Emergency Medicine | Admitting: Emergency Medicine

## 2018-04-24 DIAGNOSIS — Z87891 Personal history of nicotine dependence: Secondary | ICD-10-CM | POA: Insufficient documentation

## 2018-04-24 DIAGNOSIS — R319 Hematuria, unspecified: Secondary | ICD-10-CM | POA: Diagnosis not present

## 2018-04-24 DIAGNOSIS — I2510 Atherosclerotic heart disease of native coronary artery without angina pectoris: Secondary | ICD-10-CM | POA: Diagnosis not present

## 2018-04-24 DIAGNOSIS — I4510 Unspecified right bundle-branch block: Secondary | ICD-10-CM | POA: Diagnosis not present

## 2018-04-24 DIAGNOSIS — N189 Chronic kidney disease, unspecified: Secondary | ICD-10-CM | POA: Diagnosis not present

## 2018-04-24 DIAGNOSIS — R531 Weakness: Secondary | ICD-10-CM | POA: Diagnosis not present

## 2018-04-24 DIAGNOSIS — Z79899 Other long term (current) drug therapy: Secondary | ICD-10-CM | POA: Insufficient documentation

## 2018-04-24 DIAGNOSIS — G40909 Epilepsy, unspecified, not intractable, without status epilepticus: Secondary | ICD-10-CM | POA: Diagnosis not present

## 2018-04-24 DIAGNOSIS — N433 Hydrocele, unspecified: Secondary | ICD-10-CM | POA: Diagnosis not present

## 2018-04-24 DIAGNOSIS — R509 Fever, unspecified: Secondary | ICD-10-CM | POA: Diagnosis not present

## 2018-04-24 DIAGNOSIS — W19XXXA Unspecified fall, initial encounter: Secondary | ICD-10-CM

## 2018-04-24 DIAGNOSIS — N4889 Other specified disorders of penis: Secondary | ICD-10-CM | POA: Diagnosis not present

## 2018-04-24 LAB — PROTIME-INR
INR: 1.13
Prothrombin Time: 14.4 seconds (ref 11.4–15.2)

## 2018-04-24 LAB — CBC WITH DIFFERENTIAL/PLATELET
Abs Immature Granulocytes: 0.03 10*3/uL (ref 0.00–0.07)
Basophils Absolute: 0 10*3/uL (ref 0.0–0.1)
Basophils Relative: 0 %
Eosinophils Absolute: 0 10*3/uL (ref 0.0–0.5)
Eosinophils Relative: 0 %
HCT: 36.5 % — ABNORMAL LOW (ref 39.0–52.0)
Hemoglobin: 12.2 g/dL — ABNORMAL LOW (ref 13.0–17.0)
Immature Granulocytes: 1 %
Lymphocytes Relative: 9 %
Lymphs Abs: 0.6 10*3/uL — ABNORMAL LOW (ref 0.7–4.0)
MCH: 32 pg (ref 26.0–34.0)
MCHC: 33.4 g/dL (ref 30.0–36.0)
MCV: 95.8 fL (ref 80.0–100.0)
Monocytes Absolute: 0.2 10*3/uL (ref 0.1–1.0)
Monocytes Relative: 3 %
NRBC: 0 % (ref 0.0–0.2)
Neutro Abs: 5.8 10*3/uL (ref 1.7–7.7)
Neutrophils Relative %: 87 %
Platelets: 72 10*3/uL — ABNORMAL LOW (ref 150–400)
RBC: 3.81 MIL/uL — ABNORMAL LOW (ref 4.22–5.81)
RDW: 14.3 % (ref 11.5–15.5)
WBC: 6.6 10*3/uL (ref 4.0–10.5)

## 2018-04-24 LAB — URINALYSIS, ROUTINE W REFLEX MICROSCOPIC
Bilirubin Urine: NEGATIVE
GLUCOSE, UA: NEGATIVE mg/dL
Ketones, ur: NEGATIVE mg/dL
Leukocytes, UA: NEGATIVE
Nitrite: NEGATIVE
Protein, ur: 30 mg/dL — AB
Specific Gravity, Urine: 1.008 (ref 1.005–1.030)
pH: 5 (ref 5.0–8.0)

## 2018-04-24 LAB — URINE CULTURE: Culture: >=100000 — AB

## 2018-04-24 LAB — COMPREHENSIVE METABOLIC PANEL
ALK PHOS: 61 U/L (ref 38–126)
ALT: 32 U/L (ref 0–44)
ANION GAP: 9 (ref 5–15)
AST: 48 U/L — ABNORMAL HIGH (ref 15–41)
Albumin: 3.2 g/dL — ABNORMAL LOW (ref 3.5–5.0)
BUN: 23 mg/dL (ref 8–23)
CALCIUM: 8.2 mg/dL — AB (ref 8.9–10.3)
CO2: 21 mmol/L — ABNORMAL LOW (ref 22–32)
Chloride: 109 mmol/L (ref 98–111)
Creatinine, Ser: 1.37 mg/dL — ABNORMAL HIGH (ref 0.61–1.24)
GFR calc Af Amer: 58 mL/min — ABNORMAL LOW (ref >60)
GFR calc non Af Amer: 50 mL/min — ABNORMAL LOW (ref >60)
Glucose, Bld: 119 mg/dL — ABNORMAL HIGH (ref 70–99)
Potassium: 3.8 mmol/L (ref 3.5–5.1)
Sodium: 139 mmol/L (ref 135–145)
Total Bilirubin: 1.3 mg/dL — ABNORMAL HIGH (ref 0.3–1.2)
Total Protein: 6.7 g/dL (ref 6.5–8.1)

## 2018-04-24 LAB — LEGIONELLA PNEUMOPHILA SEROGP 1 UR AG: L. pneumophila Serogp 1 Ur Ag: NEGATIVE

## 2018-04-24 LAB — I-STAT CG4 LACTIC ACID, ED: Lactic Acid, Venous: 0.97 mmol/L (ref 0.5–1.9)

## 2018-04-24 LAB — CBG MONITORING, ED: Glucose-Capillary: 107 mg/dL — ABNORMAL HIGH (ref 70–99)

## 2018-04-24 MED ORDER — SODIUM CHLORIDE 0.9 % IV BOLUS
1000.0000 mL | Freq: Once | INTRAVENOUS | Status: AC
  Administered 2018-04-24: 1000 mL via INTRAVENOUS

## 2018-04-24 MED ORDER — CEFDINIR 300 MG PO CAPS
300.0000 mg | ORAL_CAPSULE | Freq: Two times a day (BID) | ORAL | Status: DC
  Administered 2018-04-24: 300 mg via ORAL
  Filled 2018-04-24: qty 1

## 2018-04-24 MED ORDER — AZITHROMYCIN 250 MG PO TABS
250.0000 mg | ORAL_TABLET | Freq: Once | ORAL | Status: AC
  Administered 2018-04-24: 250 mg via ORAL
  Filled 2018-04-24: qty 1

## 2018-04-24 NOTE — ED Triage Notes (Signed)
Patient complaining of a fall when getting up to go to the restroom this am. Patient might have hit his head. When found him he was on the floor trying to get up. Patient has kidney cancer and has blood dripping from his penis.

## 2018-04-24 NOTE — ED Notes (Signed)
Pt ambulated without assistance. Pt also able to sit and get out of chair without assistance.

## 2018-04-24 NOTE — Discharge Instructions (Addendum)
Thank you for allowing me to care for you today in the Emergency Department.   Continue to take your home cefdinir and azithromycin until you finish the entire course.  Your morning dose was given today in the emergency department.  Follow-up with your oncologist for recheck in the next 2 to 3 days.  You should be receiving a call in the next 24 hours from [facility] set up an appointment.   It is important that you continue to eat and drink to avoid dehydration.   Return to the emergency department if you develop significantly worsening weakness, worsening bleeding that does not improve after the next 72 hours, high fevers after being on antibiotics for 72 hours, or other new, concerning symptoms.

## 2018-04-24 NOTE — ED Provider Notes (Signed)
Medical screening examination/treatment/procedure(s) were conducted as a shared visit with non-physician practitioner(s) and myself.  I personally evaluated the patient during the encounter.  77 year old with multiple past medical problems to include amyloidosis with nephrosis and nephrotic syndrome.  Also has a recent history of some abnormal bone marrow cancer status post bone marrow transplant in January.  He was recently admitted to the hospital for pneumonia and urinary tract infection is on Omnicef and azithromycin he was discharged yesterday afternoon.  Patient was febrile overnight and had a fall that I am not sure he remembers.  He gives me one story begins the physician assistant another story that are both possible.  He subsequently has an abrasion on the front of his head and presents here for further evaluation for generalized weakness.  He is also noticed worsening hematuria.  Exam with bilateral scrotal ttp and mild R swelling. Abdomen benign. VS WNL.  Will evaluate for this unwitnessed fall and syncope.  Also evaluate for the worsening generalized weakness and hematuria.  Consider readmission if abnormal or not at baseline.   EKG Interpretation  Date/Time:  Tuesday April 24 2018 07:21:32 EST Ventricular Rate:  74 PR Interval:    QRS Duration: 110 QT Interval:  412 QTC Calculation: 458 R Axis:   -43 Text Interpretation:  Sinus arrhythmia Ventricular premature complex Incomplete RBBB and LAFB Low voltage, precordial leads Consider anterior infarct mild qrs widening similar to previous Confirmed by Merrily Pew [phone]) on 04/24/2018 8:17:32 AM      Wilmon Conover, Corene Cornea, MD 04/24/18 1624

## 2018-04-24 NOTE — ED Notes (Signed)
Urinal at bedside, pt aware of sample needed

## 2018-04-24 NOTE — ED Provider Notes (Signed)
[facility] DEPT Provider Note   CSN: [id number] Arrival date & time: 04/24/18  0605     History   Chief Complaint Chief Complaint  Patient presents with  . Hematuria  . Fall    HPI Alex [name]. is a 77 y.o. male a history of amyloidosis, CAD, CKD, seizures, diverticulosis, GERD,  hyperlipidemia, hypertension who presents to the emergency department from home with a chief complaint of penile bleeding.  The patient reports that he noticed blood dripping from his penis around 2 AM.  His wife reports he felt warm at that time so she checked his temperature.  Oral temperature was 100.2 so she gave him 2 tablets of Tylenol.  No history of similar.  He was admitted to the hospital on 04/22/2018 and was discharged yesterday with azithromycin and cefdinir for 8 days after he was diagnosed with a UTI and a left lower lobe pneumonia.  Urine culture sensitivities are still pending.  The patient's wife is concerned as she felt he has been more weak over the last week.  She states that she is recently had shoulder surgery and has difficulty if the patient needs help changing positions.  He has required assistance with getting in and out of his recliner several times over the last week.  She reports that she awoke last night at midnight and called EMS after the patient had a fall.  Patient states that he does not recall the fall or any of the events until EMS arrived.  The patient's wife states that it appeared that his head was back on the mattress.  She is concerned that maybe his legs gave out while he was walking to the bathroom as he has been more weak.  She notes a superficial abrasion to the front of his scalp, which is new from the fall.  He states "I think I hit my head.  I probably was getting up to walk to the bathroom, but I do not remember."  He denies headache, visual changes, new weakness or numbness, slurred speech, facial droop, nausea, vomiting,  diarrhea, dysuria, rectal pain, constipation, penile discharge, or back pain.   The history is provided by the patient. No language interpreter was used.    Past Medical History:  Diagnosis Date  . Amyloidosis ([place])   . Arthritis   . Basal cell carcinoma   . CAD (coronary artery disease)    Lexiscan Myoview 6/14: Normal study, no scar or ischemia, EF 62%  . Cataract   . Chronic kidney disease   . Colon polyp   . Coronary artery disease    moderate disease by cath 2011  . Diverticulosis of colon   . GERD (gastroesophageal reflux disease)   . Hearing loss   . Heart disease   . Hiatal hernia   . Hyperlipidemia   . Hypertension   . MRSA (methicillin resistant staph aureus) culture positive   . Seizure disorder ([place])   . Seizures ([place])    last in 1970s    Patient Active Problem List   Diagnosis Date Noted  . Left lower lobe pneumonia ([place]) 04/22/2018  . Sepsis due to pneumonia ([place]) 04/22/2018  . Folliculitis 42/68/3419  . AL amyloidosis ([place]) 08/21/2016  . Umbilical hernia 62/22/9798  . DIZZINESS 02/19/2010  . CAD, NATIVE VESSEL 07/21/2009  . CHEST PAIN-UNSPECIFIED 07/21/2009  . HYPERLIPIDEMIA 04/30/2007  . Essential hypertension 04/30/2007  . GASTROESOPHAGEAL REFLUX DISEASE 04/30/2007  . SEIZURE DISORDER 04/30/2007  . COMPRESSION FRACTURE,  L1 VERTEBRA 04/30/2007  . DIVERTICULOSIS, COLON, HX OF 04/30/2007  . Other postprocedural status(V45.89) 04/30/2007    Past Surgical History:  Procedure Laterality Date  . BUNIONECTOMY    . CARPAL TUNNEL RELEASE  1994   left  . CERVICAL FUSION    . compression fracture    . FOOT SURGERY     right -twice, left foot once  . HERNIA REPAIR     Umbilical with PVP  . KNEE ARTHROSCOPY     left knee  . [place]   left - scope  . NASAL SEPTUM SURGERY    . NECK SURGERY    . NOSE SURGERY     twice  . PAROTIDECTOMY Right 03/09/2017  . ROTATOR CUFF REPAIR     twice, both shoulders  . ROTATOR CUFF REPAIR    .  SHOULDER SURGERY    . SKIN BIOPSY    . SPINE SURGERY     C2, C3, C4  . TRIGGER FINGER RELEASE     both hands, twice  . TRIGGER FINGER RELEASE    . VASECTOMY          Home Medications    Prior to Admission medications   Medication Sig Start Date End Date Taking? Authorizing Provider  azithromycin (ZITHROMAX) 250 MG tablet Take 1 tablet (250 mg total) by mouth daily for 3 days. 04/24/18 04/27/18 Yes Oretha Milch D, MD  cefdinir (OMNICEF) 300 MG capsule Take 1 capsule (300 mg total) by mouth every 12 (twelve) hours for 8 days. 04/24/18 05/02/18 Yes Oretha Milch D, MD  acyclovir (ZOVIRAX) 800 MG tablet Take 800 mg by mouth 2 (two) times daily.    [provider]  atorvastatin (LIPITOR) 40 MG tablet Take 40 mg by mouth at bedtime.     [provider]  cetirizine (ZYRTEC ALLERGY) 10 MG tablet Take 1 tablet (10 mg total) by mouth daily. 10/30/17   Tanner, Lyndon Code., [name]  ezetimibe (ZETIA) 10 MG tablet Take 10 mg by mouth daily.  04/24/17   [provider]  famotidine (PEPCID) 20 MG tablet Take 2 tablets (40 mg total) by mouth daily. 10/30/17   Tanner, Lyndon Code., [name]  folic acid (FOLVITE) 1 MG tablet Take 1 mg by mouth daily.  04/24/17   [provider]  hydrOXYzine (ATARAX/VISTARIL) 25 MG tablet Take 1 tablet (25 mg total) by mouth every 8 (eight) hours as needed for itching. 10/30/17   Tanner, Lyndon Code., [name]  isosorbide mononitrate (IMDUR) 30 MG 24 hr tablet Take 15 mg by mouth daily.  12/07/16   [provider]  ixazomib citrate (NINLARO) 3 MG capsule Take 1 capsule (3mg ) by mouth once weekly for 3 weeks on, 1 week off, repeat Q 4 weeks. Take on empty stomach 1hr before or 2hrs after food. 02/20/18   Brunetta Genera, MD  losartan (COZAAR) 25 MG tablet Take 25 mg by mouth daily.    [provider]  Multiple Vitamin (MULTI-VITAMINS) TABS Take 1 tablet by mouth daily. 04/24/17   [provider]  mupirocin ointment (BACTROBAN) 2 % Apply to  skin lesion 3 times daily. Patient taking differently: Apply 1 application topically daily as needed (wound care).  03/12/18   Tanner, Lyndon Code., [name]  nitroGLYCERIN (NITROSTAT) 0.4 MG SL tablet Place 0.4 mg under the tongue every 5 (five) minutes as needed for chest pain.    [provider]  omeprazole (PRILOSEC) 20 MG capsule Take 20 mg by mouth daily.  [provider]  ondansetron (ZOFRAN) 8 MG tablet Take 8 mg by mouth 2 (two) times daily as needed for nausea or vomiting.    [provider]  potassium chloride SA (K-DUR,KLOR-CON) 20 MEQ tablet Take 20 mEq by mouth at bedtime.     [provider]  prochlorperazine (COMPAZINE) 10 MG tablet Take 1 tablet (10 mg total) by mouth every 6 (six) hours as needed for nausea or vomiting. 03/12/18   Tanner, Lyndon Code., [name]  triamcinolone cream (KENALOG) 0.1 % Apply 1 application topically as needed for rash.    [provider]    Family History Family History  Problem Relation Age of Onset  . Diabetes Father   . Heart disease Father   . Heart attack Father 37  . Hypertension Father   . Cancer Sister   . Hearing loss Mother   . Eczema Sister   . Hyperlipidemia Other   . Diabetes Other   . Hypertension Other   . Seizures Other   . Thyroid disease Other   . Colon cancer Neg Hx   . Stroke Neg Hx     Social History Social History   Tobacco Use  . Smoking status: Former Smoker    Last attempt to quit: 04/25/1969    Years since quitting: 49.0  . Smokeless tobacco: Former Network engineer Use Topics  . Alcohol use: Yes    Alcohol/week: 7.0 - 14.0 standard drinks    Types: 7 - 14 Glasses of wine per week    Comment: socially shots of wiskey  . Drug use: No     Allergies   Ramipril   Review of Systems Review of Systems  Constitutional: Positive for fever. Negative for appetite change and chills.  HENT: Negative for congestion.   Eyes: Negative for visual disturbance.  Respiratory: Negative  for shortness of breath.   Cardiovascular: Negative for chest pain.  Gastrointestinal: Negative for abdominal pain, blood in stool, constipation, diarrhea, nausea and vomiting.  Genitourinary: Negative for dysuria, frequency, penile swelling, testicular pain and urgency.       Penile bleeding  Musculoskeletal: Negative for back pain, myalgias and neck pain.  Skin: Negative for rash.  Allergic/Immunologic: Negative for immunocompromised state.  Neurological: Positive for weakness (generalized). Negative for syncope, numbness and headaches.  Psychiatric/Behavioral: Negative for confusion.     Physical Exam Updated Vital Signs BP 136/78   Pulse 87   Temp 98.3 F (36.8 C) (Oral)   Resp 18   Ht 5\' 10"  (1.778 m)   Wt 95.3 kg   SpO2 96%   BMI 30.13 kg/m   Physical Exam Vitals signs and nursing note reviewed. Exam conducted with a chaperone present.  Constitutional:      Appearance: He is well-developed. He is not ill-appearing or toxic-appearing.  HENT:     Head: Normocephalic.     Nose: No congestion.  Eyes:     Extraocular Movements: Extraocular movements intact.     Conjunctiva/sclera: Conjunctivae normal.     Pupils: Pupils are equal, round, and reactive to light.  Neck:     Musculoskeletal: Neck supple.  Cardiovascular:     Rate and Rhythm: Normal rate and regular rhythm.     Heart sounds: Murmur present. No friction rub. No gallop.   Pulmonary:     Effort: Pulmonary effort is normal. No respiratory distress.     Breath sounds: No stridor. No wheezing, rhonchi or rales.  Abdominal:     General: There is  no distension.     Palpations: Abdomen is soft. There is no mass.     Tenderness: There is no abdominal tenderness. There is no right CVA tenderness, left CVA tenderness, guarding or rebound.     Hernia: No hernia is present.  Genitourinary:    Comments: Tender to palpation to the right scrotum.  No left scrotal tenderness.  There is a small amount of hard stool  palpable with DRE.  No impaction.  Prostate is nontender. Musculoskeletal:     Right lower leg: No edema.     Left lower leg: No edema.  Skin:    General: Skin is warm and dry.  Neurological:     Mental Status: He is alert.     Comments: Cranial nerves II through XII are grossly intact.  5-5 strength against resistance of the bilateral upper and lower extremities.  Sensation is intact and equal throughout.  Ambulatory without difficulty.  Finger-nose is intact bilaterally.  Speaks in complete, fluent sentences.  Psychiatric:        Behavior: Behavior normal.      ED Treatments / Results  Labs (all labs ordered are listed, but only abnormal results are displayed) Labs Reviewed  CBC WITH DIFFERENTIAL/PLATELET - Abnormal; Notable for the following components:      Result Value   RBC 3.81 (*)    Hemoglobin 12.2 (*)    HCT 36.5 (*)    Platelets 72 (*)    Lymphs Abs 0.6 (*)    All other components within normal limits  COMPREHENSIVE METABOLIC PANEL - Abnormal; Notable for the following components:   CO2 21 (*)    Glucose, Bld 119 (*)    Creatinine, Ser 1.37 (*)    Calcium 8.2 (*)    Albumin 3.2 (*)    AST 48 (*)    Total Bilirubin 1.3 (*)    GFR calc non Af Amer 50 (*)    GFR calc Af Amer 58 (*)    All other components within normal limits  URINALYSIS, ROUTINE W REFLEX MICROSCOPIC - Abnormal; Notable for the following components:   Hgb urine dipstick MODERATE (*)    Protein, ur 30 (*)    Bacteria, UA RARE (*)    All other components within normal limits  CBG MONITORING, ED - Abnormal; Notable for the following components:   Glucose-Capillary 107 (*)    All other components within normal limits  PROTIME-INR  I-STAT CG4 LACTIC ACID, ED    EKG EKG Interpretation  Date/Time:  Tuesday April 24 2018 07:21:32 EST Ventricular Rate:  74 PR Interval:    QRS Duration: 110 QT Interval:  412 QTC Calculation: 458 R Axis:   -43 Text Interpretation:  Sinus arrhythmia  Ventricular premature complex Incomplete RBBB and LAFB Low voltage, precordial leads Consider anterior infarct mild qrs widening similar to previous Confirmed by Merrily Pew [phone]) on 04/24/2018 8:17:32 AM   Radiology US Scrotum W/doppler  Result Date: 04/24/2018 CLINICAL DATA:  Bilateral scrotal tenderness greatest on the right. History of previous vasectomy. EXAM: SCROTAL ULTRASOUND DOPPLER ULTRASOUND OF THE TESTICLES TECHNIQUE: Complete ultrasound examination of the testicles, epididymis, and other scrotal structures was performed. Color and spectral Doppler ultrasound were also utilized to evaluate blood flow to the testicles. COMPARISON:  None. FINDINGS: Right testicle Measurements: 4.0 x 2.0 x 3.1 cm. No mass or microlithiasis visualized. Left testicle Measurements: 4.2 x 2.0 x 3.5 cm. No mass or microlithiasis visualized. Right epididymis: There are calcifications noted within the right epididymis.  Left epididymis:  Normal in size and appearance. Hydrocele:  Small left-sided hydroceles. Varicocele:  Small left-sided varicocele. Pulsed Doppler interrogation of both testes demonstrates normal low resistance arterial and venous waveforms bilaterally. IMPRESSION: No evidence of testicular or epididymal masses. No evidence of torsion or acute epididymo-orchitis. Small left-sided hydrocele and varicocele. Electronically Signed   By: David  Martinique M.D.   On: 04/24/2018 09:37    Procedures Procedures (including critical care time)  Medications Ordered in ED Medications  cefdinir (OMNICEF) capsule 300 mg (300 mg Oral Given 04/24/18 1239)  sodium chloride 0.9 % bolus 1,000 mL (0 mLs Intravenous Stopped 04/24/18 1105)  azithromycin (ZITHROMAX) tablet 250 mg (250 mg Oral Given 04/24/18 1239)     Initial Impression / Assessment and Plan / ED Course  I have reviewed the triage vital signs and the nursing notes.  Pertinent labs & imaging results that were available during my care of the patient  were reviewed by me and considered in my medical decision making (see chart for details).     77 year old male with a history of amyloidosis, CAD, CKD, seizures, diverticulosis, GERD,  hyperlipidemia, hypertension sending from home with a small amount of penile bleeding, onset tonight.  The patient's wife also recorded an oral temp of 100.2 and was given Tylenol.  The patient also had a fall around midnight that he does not recall.  He is unsure if he syncopized.  The patient was seen and independently evaluated by Dr. Dayna Barker, attending physician.  Patient is adamant that he does not want a head CT at this time.  Neuro exam is reassuring without focal deficits.  Per chart review, urine culture SS from recent admission are pending.  UA with moderate hemoglobinuria, but otherwise improving from previous.  On physical exam he does have some right scrotal tenderness.  Scrotal ultrasound with a small left-sided hydrocele and varicocele.  Otherwise unremarkable.  There is a 20K decrease in platelets from previous, but otherwise unremarkable.  There is a mild increase in total bilirubin and AST, thought to be secondary to mild dehydration.  He was given his home dose of azithromycin and Ceftinir.  He was also given a 1 L fluid bolus.  On reevaluation, he reports that he is feeling much better.  Shared decision making conversation with the patient, wife, and daughter.  The patient's wife expresses concern about her ability to care for the patient at home if he remains weak.  The patient is adamant that he does not wish to be readmitted to the hospital.  Reviewed recent discharge summary.  Patient is to be set up for home PT OT and home health.  Spoke with Levada Dy, ED case manager, who states the patient's wife should be receiving a call today.  The patient was also ambulated in the ED.  He ambulated independently without difficulty.  He was unable to sit and get himself out of a chair without difficulty.  After  this evaluation, the patient's expressed ease with discharging the patient to home as he appeared markedly improved prior to ED arrival.   Suspect mild bleeding may be secondary to recent administration of antibiotics for patient's UTI.  While the patient was in the ED, urine culture SS has resulted.  Urine culture with E. coli with sensitivity to Rocephin and cefazolin.  Recommended continuation of cefdinir and follow-up in the outpatient clinic for recheck in 2 to 3 days.  The patient and family was given strict return precautions to the emergency department.  He is hemodynamically stable and in no acute distress.  He is safe for discharge to home with outpatient follow-up at this time.  Final Clinical Impressions(s) / ED Diagnoses   Final diagnoses:  Hematuria, unspecified type  Fall, initial encounter    ED Discharge Orders    None       Joanne Gavel, [name] 04/24/18 1537    Mesner, Corene Cornea, MD 04/24/18 1623

## 2018-04-26 DIAGNOSIS — I444 Left anterior fascicular block: Secondary | ICD-10-CM | POA: Diagnosis not present

## 2018-04-26 DIAGNOSIS — R8281 Pyuria: Secondary | ICD-10-CM | POA: Diagnosis not present

## 2018-04-26 DIAGNOSIS — R2689 Other abnormalities of gait and mobility: Secondary | ICD-10-CM | POA: Diagnosis not present

## 2018-04-26 DIAGNOSIS — I350 Nonrheumatic aortic (valve) stenosis: Secondary | ICD-10-CM | POA: Diagnosis not present

## 2018-04-26 DIAGNOSIS — I1310 Hypertensive heart and chronic kidney disease without heart failure, with stage 1 through stage 4 chronic kidney disease, or unspecified chronic kidney disease: Secondary | ICD-10-CM | POA: Diagnosis not present

## 2018-04-26 DIAGNOSIS — E8581 Light chain (AL) amyloidosis: Secondary | ICD-10-CM | POA: Diagnosis not present

## 2018-04-26 DIAGNOSIS — I2510 Atherosclerotic heart disease of native coronary artery without angina pectoris: Secondary | ICD-10-CM | POA: Diagnosis not present

## 2018-04-26 DIAGNOSIS — J181 Lobar pneumonia, unspecified organism: Secondary | ICD-10-CM | POA: Diagnosis not present

## 2018-04-26 DIAGNOSIS — N183 Chronic kidney disease, stage 3 (moderate): Secondary | ICD-10-CM | POA: Diagnosis not present

## 2018-04-26 DIAGNOSIS — I4510 Unspecified right bundle-branch block: Secondary | ICD-10-CM | POA: Diagnosis not present

## 2018-04-27 DIAGNOSIS — E8581 Light chain (AL) amyloidosis: Secondary | ICD-10-CM | POA: Diagnosis not present

## 2018-04-27 DIAGNOSIS — Z7689 Persons encountering health services in other specified circumstances: Secondary | ICD-10-CM | POA: Diagnosis not present

## 2018-04-27 DIAGNOSIS — D849 Immunodeficiency, unspecified: Secondary | ICD-10-CM | POA: Diagnosis not present

## 2018-04-27 DIAGNOSIS — J181 Lobar pneumonia, unspecified organism: Secondary | ICD-10-CM | POA: Diagnosis not present

## 2018-04-27 DIAGNOSIS — Z6829 Body mass index (BMI) 29.0-29.9, adult: Secondary | ICD-10-CM | POA: Diagnosis not present

## 2018-04-27 DIAGNOSIS — R5383 Other fatigue: Secondary | ICD-10-CM | POA: Diagnosis not present

## 2018-04-27 DIAGNOSIS — R3129 Other microscopic hematuria: Secondary | ICD-10-CM | POA: Diagnosis not present

## 2018-04-27 LAB — CULTURE, BLOOD (ROUTINE X 2)
CULTURE: NO GROWTH
Culture: NO GROWTH
Special Requests: ADEQUATE

## 2018-04-30 DIAGNOSIS — I444 Left anterior fascicular block: Secondary | ICD-10-CM | POA: Diagnosis not present

## 2018-04-30 DIAGNOSIS — R8281 Pyuria: Secondary | ICD-10-CM | POA: Diagnosis not present

## 2018-04-30 DIAGNOSIS — J181 Lobar pneumonia, unspecified organism: Secondary | ICD-10-CM | POA: Diagnosis not present

## 2018-04-30 DIAGNOSIS — I2510 Atherosclerotic heart disease of native coronary artery without angina pectoris: Secondary | ICD-10-CM | POA: Diagnosis not present

## 2018-04-30 DIAGNOSIS — R2689 Other abnormalities of gait and mobility: Secondary | ICD-10-CM | POA: Diagnosis not present

## 2018-04-30 DIAGNOSIS — I350 Nonrheumatic aortic (valve) stenosis: Secondary | ICD-10-CM | POA: Diagnosis not present

## 2018-04-30 DIAGNOSIS — I1310 Hypertensive heart and chronic kidney disease without heart failure, with stage 1 through stage 4 chronic kidney disease, or unspecified chronic kidney disease: Secondary | ICD-10-CM | POA: Diagnosis not present

## 2018-04-30 DIAGNOSIS — I4510 Unspecified right bundle-branch block: Secondary | ICD-10-CM | POA: Diagnosis not present

## 2018-04-30 DIAGNOSIS — N183 Chronic kidney disease, stage 3 (moderate): Secondary | ICD-10-CM | POA: Diagnosis not present

## 2018-04-30 DIAGNOSIS — E8581 Light chain (AL) amyloidosis: Secondary | ICD-10-CM | POA: Diagnosis not present

## 2018-05-07 DIAGNOSIS — N183 Chronic kidney disease, stage 3 (moderate): Secondary | ICD-10-CM | POA: Diagnosis not present

## 2018-05-07 DIAGNOSIS — I4510 Unspecified right bundle-branch block: Secondary | ICD-10-CM | POA: Diagnosis not present

## 2018-05-07 DIAGNOSIS — R2689 Other abnormalities of gait and mobility: Secondary | ICD-10-CM | POA: Diagnosis not present

## 2018-05-07 DIAGNOSIS — I444 Left anterior fascicular block: Secondary | ICD-10-CM | POA: Diagnosis not present

## 2018-05-07 DIAGNOSIS — I350 Nonrheumatic aortic (valve) stenosis: Secondary | ICD-10-CM | POA: Diagnosis not present

## 2018-05-07 DIAGNOSIS — R8281 Pyuria: Secondary | ICD-10-CM | POA: Diagnosis not present

## 2018-05-07 DIAGNOSIS — E8581 Light chain (AL) amyloidosis: Secondary | ICD-10-CM | POA: Diagnosis not present

## 2018-05-07 DIAGNOSIS — I2510 Atherosclerotic heart disease of native coronary artery without angina pectoris: Secondary | ICD-10-CM | POA: Diagnosis not present

## 2018-05-07 DIAGNOSIS — J181 Lobar pneumonia, unspecified organism: Secondary | ICD-10-CM | POA: Diagnosis not present

## 2018-05-07 DIAGNOSIS — I1310 Hypertensive heart and chronic kidney disease without heart failure, with stage 1 through stage 4 chronic kidney disease, or unspecified chronic kidney disease: Secondary | ICD-10-CM | POA: Diagnosis not present

## 2018-05-14 ENCOUNTER — Telehealth: Payer: Self-pay | Admitting: *Deleted

## 2018-05-14 DIAGNOSIS — E8581 Light chain (AL) amyloidosis: Secondary | ICD-10-CM

## 2018-05-14 MED ORDER — IXAZOMIB CITRATE 3 MG PO CAPS: ORAL_CAPSULE | ORAL | 2 refills | Status: DC

## 2018-05-14 NOTE — Telephone Encounter (Addendum)
Patient requests refill of Ninlaro and for it to be faxed to [facility] [phone] .  RX faxed to [facility] - fax confirmation received.

## 2018-05-15 DIAGNOSIS — Z9484 Stem cells transplant status: Secondary | ICD-10-CM | POA: Diagnosis not present

## 2018-05-15 DIAGNOSIS — I25119 Atherosclerotic heart disease of native coronary artery with unspecified angina pectoris: Secondary | ICD-10-CM | POA: Diagnosis not present

## 2018-05-15 DIAGNOSIS — Z9481 Bone marrow transplant status: Secondary | ICD-10-CM | POA: Diagnosis not present

## 2018-05-15 DIAGNOSIS — K219 Gastro-esophageal reflux disease without esophagitis: Secondary | ICD-10-CM | POA: Diagnosis not present

## 2018-05-15 DIAGNOSIS — G629 Polyneuropathy, unspecified: Secondary | ICD-10-CM | POA: Diagnosis not present

## 2018-05-15 DIAGNOSIS — N390 Urinary tract infection, site not specified: Secondary | ICD-10-CM | POA: Diagnosis not present

## 2018-05-15 DIAGNOSIS — B029 Zoster without complications: Secondary | ICD-10-CM | POA: Diagnosis not present

## 2018-05-15 DIAGNOSIS — C649 Malignant neoplasm of unspecified kidney, except renal pelvis: Secondary | ICD-10-CM | POA: Diagnosis not present

## 2018-05-15 DIAGNOSIS — R3129 Other microscopic hematuria: Secondary | ICD-10-CM | POA: Diagnosis not present

## 2018-05-15 DIAGNOSIS — I129 Hypertensive chronic kidney disease with stage 1 through stage 4 chronic kidney disease, or unspecified chronic kidney disease: Secondary | ICD-10-CM | POA: Diagnosis not present

## 2018-05-15 DIAGNOSIS — G8929 Other chronic pain: Secondary | ICD-10-CM | POA: Diagnosis not present

## 2018-05-15 DIAGNOSIS — E785 Hyperlipidemia, unspecified: Secondary | ICD-10-CM | POA: Diagnosis not present

## 2018-05-16 DIAGNOSIS — R509 Fever, unspecified: Secondary | ICD-10-CM | POA: Diagnosis not present

## 2018-05-16 DIAGNOSIS — Z6829 Body mass index (BMI) 29.0-29.9, adult: Secondary | ICD-10-CM | POA: Diagnosis not present

## 2018-05-16 DIAGNOSIS — N390 Urinary tract infection, site not specified: Secondary | ICD-10-CM | POA: Diagnosis not present

## 2018-05-16 DIAGNOSIS — E8581 Light chain (AL) amyloidosis: Secondary | ICD-10-CM | POA: Diagnosis not present

## 2018-05-16 DIAGNOSIS — R0609 Other forms of dyspnea: Secondary | ICD-10-CM | POA: Diagnosis not present

## 2018-05-16 DIAGNOSIS — D849 Immunodeficiency, unspecified: Secondary | ICD-10-CM | POA: Diagnosis not present

## 2018-05-16 DIAGNOSIS — R6510 Systemic inflammatory response syndrome (SIRS) of non-infectious origin without acute organ dysfunction: Secondary | ICD-10-CM | POA: Diagnosis not present

## 2018-05-16 DIAGNOSIS — I10 Essential (primary) hypertension: Secondary | ICD-10-CM | POA: Diagnosis not present

## 2018-05-28 NOTE — Progress Notes (Signed)
HEMATOLOGY/ONCOLOGY CLINIC NOTE  Date of Service: 05/29/18   Patient Care Team: [name], MD as PCP - General (Internal Medicine) Burnell Blanks, MD as PCP - Cardiology (Cardiology) Brunetta Genera, MD as Consulting Physician (Hematology) Nephrology - Dr Madelon Lips MD Dr Henrene Pastor MD - GI  CHIEF COMPLAINTS/PURPOSE OF CONSULTATION:  AL Amyloidosis -with Nephrotic syndrome . No overt CHF pEF  HISTORY OF PRESENTING ILLNESS:   Alex Crass. is a wonderful 78 y.o. male who has been referred to [place] by Dr .[name], MD /Dr Madelon Lips MD for evaluation and management of newly diagnosed Kidney AL Amyloidosis with nephrotic syndrome.  Patient has a history of hypertension, dyslipidemia, coronary artery disease, seizure disorder on Dilantin who was apparently in his usual state of health until 3 months ago when he started developing new onset lower extremity edema. Patient had a UA at the time that showed 4+ protein in 24-hour collection revealed 15 g of protein. Additional workup showed a creatinine of 0.8 with an albumin of 2.3 and negative SPEP and UPEP. Apparently had an elevated K/L SFLC ratio of 5.19.  He was urgently referred by his primary care physician to nephrology for additional evaluation. Patient notes no bleeding issues. No nosebleeds. No periorbital bleeding. No abnormal skin rashes. Has had significant NSAID exposure and use to take a fair amount of naproxen for chronic low back pain but has cut down on this.  Due to lack of clear etiology for his nephrotic syndrome the patient underwent a kidney biopsy on 08/06/2015 accession [id number] which showed AL amyloidosis, lambda immunophenotype, [place] red positive. Noted to have severe arteriosclerosis with moderate tubular interstitial scarring.  Patient was referred to [place] for further evaluation and treatment of his AL Amyloidosis, Patient notes that his leg swelling has improved with diuretic  therapy.  He notes no weight loss. No night sweats. No focal bone pains. No skin rashes. Lungs normal bleeding or bruising.  Patient notes that he had a lot of reading online and he and his wife had an extensive list of questions which were answered in detail.  He notes he has had some chronic issues with upper abdominal pain and nausea and that he follows with Dr. Henrene Pastor and is apparently being scheduled for an EGD and colonoscopy according to his report.  Denies having and lacks tongue. No chest pain and no overt new shortness of breath. Patient is uncertain if an echocardiogram has been ordered or scheduled.  INTERVAL HISTORY  Alex [name] is here for a scheduled follow-up for his AL amyloidosis. The patient's last visit with [place] was on 03/28/18. He is accompanied today by his wife. The pt reports that he is doing well overall.   The pt reports that he had pneumonia and a UTI in late December after our last visit. He finished his antibiotic courses, developed diarrhea, and notes that this resolved. The pt notes that he is walking better now, having regained some of his strength. He notes that he walks about 0.5 mile 2-3 times each day. His wife notes some concern that his last two infections "licked him."   The pt denies any problems taking Ninlaro, and notes that he has some mild tingling in his feet "every once in a while." The pt denies any rashes and apparently has not been taking his pre-medications.  The pt notes that he work up this morning feeling dizzy. He notes that he is eating 1/3 as much as he  used to, noting he isn't as hungry as he used to be. He endorses consuming minimal amounts of salt.  Lab results today (05/29/18) of CBC w/diff and CMP is as follows: all values are WNL except for RBC at 4.16, PLT at 142k, Glucose at 115, Creatinine at 1.69, GFR at 39. 05/29/18 MMP and SFLC are pending  On review of systems, pt reports weak appetite, eating less, recent infections, resolved  diarrhea, and denies skin rashes, abdominal pains, problems passing urine, leg swelling, and any other symptoms.    MEDICAL HISTORY:  Past Medical History:  Diagnosis Date  . Amyloidosis ([place])   . Arthritis   . Basal cell carcinoma   . CAD (coronary artery disease)    Lexiscan Myoview 6/14: Normal study, no scar or ischemia, EF 62%  . Cataract   . Chronic kidney disease   . Colon polyp   . Coronary artery disease    moderate disease by cath 2011  . Diverticulosis of colon   . GERD (gastroesophageal reflux disease)   . Hearing loss   . Heart disease   . Hiatal hernia   . Hyperlipidemia   . Hypertension   . MRSA (methicillin resistant staph aureus) culture positive   . Seizure disorder ([place])   . Seizures ([place])    last in 1970s  Obstructive sleep apnea Gastroesophageal reflux disease Aortic stenosis Erectile dysfunction Peripheral neuropathy Seizure disorder   SURGICAL HISTORY: Past Surgical History:  Procedure Laterality Date  . BUNIONECTOMY    . CARPAL TUNNEL RELEASE  1994   left  . CERVICAL FUSION    . compression fracture    . FOOT SURGERY     right -twice, left foot once  . HERNIA REPAIR     Umbilical with PVP  . KNEE ARTHROSCOPY     left knee  . [place]   left - scope  . NASAL SEPTUM SURGERY    . NECK SURGERY    . NOSE SURGERY     twice  . PAROTIDECTOMY Right 03/09/2017  . ROTATOR CUFF REPAIR     twice, both shoulders  . ROTATOR CUFF REPAIR    . SHOULDER SURGERY    . SKIN BIOPSY    . SPINE SURGERY     C2, C3, C4  . TRIGGER FINGER RELEASE     both hands, twice  . TRIGGER FINGER RELEASE    . VASECTOMY      SOCIAL HISTORY: Social History   Socioeconomic History  . Marital status: Married    Spouse name: Not on file  . Number of children: 2  . Years of education: 33  . Highest education level: Not on file  Occupational History  . Occupation: Retired  Scientific laboratory technician  . Financial resource strain: Not on file  . Food insecurity:      Worry: Not on file    Inability: Not on file  . Transportation needs:    Medical: Not on file    Non-medical: Not on file  Tobacco Use  . Smoking status: Former Smoker    Last attempt to quit: 04/25/1969    Years since quitting: 49.1  . Smokeless tobacco: Former Network engineer and Sexual Activity  . Alcohol use: Yes    Alcohol/week: 7.0 - 14.0 standard drinks    Types: 7 - 14 Glasses of wine per week    Comment: socially shots of wiskey  . Drug use: No  . Sexual activity: Not on file  Lifestyle  . Physical activity:    Days per week: Not on file    Minutes per session: Not on file  . Stress: Not on file  Relationships  . Social connections:    Talks on phone: Not on file    Gets together: Not on file    Attends religious service: Not on file    Active member of club or organization: Not on file    Attends meetings of clubs or organizations: Not on file    Relationship status: Not on file  . Intimate partner violence:    Fear of current or ex partner: Not on file    Emotionally abused: Not on file    Physically abused: Not on file    Forced sexual activity: Not on file  Other Topics Concern  . Not on file  Social History Narrative   ** Merged History Encounter **   Lives at home w/ his wife   Right-handed   Caffeine: occasional Pepsi      Patient is currently retired.   FAMILY HISTORY: Family History  Problem Relation Age of Onset  . Diabetes Father   . Heart disease Father   . Heart attack Father 20  . Hypertension Father   . Cancer Sister   . Hearing loss Mother   . Eczema Sister   . Hyperlipidemia Other   . Diabetes Other   . Hypertension Other   . Seizures Other   . Thyroid disease Other   . Colon cancer Neg Hx   . Stroke Neg Hx     ALLERGIES:  is allergic to ramipril.  MEDICATIONS:  Current Outpatient Medications  Medication Sig Dispense Refill  . acyclovir (ZOVIRAX) 800 MG tablet Take 800 mg by mouth 2 (two) times daily.    [name] Kitchen atorvastatin  (LIPITOR) 40 MG tablet Take 40 mg by mouth at bedtime.     . cetirizine (ZYRTEC ALLERGY) 10 MG tablet Take 1 tablet (10 mg total) by mouth daily. 30 tablet 1  . ezetimibe (ZETIA) 10 MG tablet Take 10 mg by mouth daily.     . famotidine (PEPCID) 20 MG tablet Take 2 tablets (40 mg total) by mouth daily. 60 tablet 1  . folic acid (FOLVITE) 1 MG tablet Take 1 mg by mouth daily.     . hydrOXYzine (ATARAX/VISTARIL) 25 MG tablet Take 1 tablet (25 mg total) by mouth every 8 (eight) hours as needed for itching. 45 tablet 1  . isosorbide mononitrate (IMDUR) 30 MG 24 hr tablet Take 15 mg by mouth daily.     . ixazomib citrate (NINLARO) 3 MG capsule Take 1 capsule (80m) by mouth once weekly for 3 weeks on, 1 week off, repeat Q 4 weeks. Take on empty stomach 1hr before or 2hrs after food. 3 capsule 2  . losartan (COZAAR) 25 MG tablet Take 25 mg by mouth daily.    . Multiple Vitamin (MULTI-VITAMINS) TABS Take 1 tablet by mouth daily.    . mupirocin ointment (BACTROBAN) 2 % Apply to skin lesion 3 times daily. (Patient taking differently: Apply 1 application topically daily as needed (wound care). ) 30 g 1  . nitroGLYCERIN (NITROSTAT) 0.4 MG SL tablet Place 0.4 mg under the tongue every 5 (five) minutes as needed for chest pain.    .[name] Kitchenomeprazole (PRILOSEC) 20 MG capsule Take 20 mg by mouth daily.      . ondansetron (ZOFRAN) 8 MG tablet Take 8 mg by mouth 2 (two) times daily as  needed for nausea or vomiting.    . potassium chloride SA (K-DUR,KLOR-CON) 20 MEQ tablet Take 20 mEq by mouth at bedtime.     . prochlorperazine (COMPAZINE) 10 MG tablet Take 1 tablet (10 mg total) by mouth every 6 (six) hours as needed for nausea or vomiting. 30 tablet 2  . triamcinolone cream (KENALOG) 0.1 % Apply 1 application topically as needed for rash.     No current facility-administered medications for this visit.     REVIEW OF SYSTEMS:    A 10+ POINT REVIEW OF SYSTEMS WAS OBTAINED including neurology, dermatology, psychiatry,  cardiac, respiratory, lymph, extremities, GI, GU, Musculoskeletal, constitutional, breasts, reproductive, HEENT.  All pertinent positives are noted in the HPI.  All others are negative.   PHYSICAL EXAMINATION: ECOG PERFORMANCE STATUS: 1 - Symptomatic but completely ambulatory  Vitals:   05/29/18 1400  BP: 101/75  Pulse: 77  Resp: 18  Temp: 97.9 F (36.6 C)  SpO2: 98%   Filed Weights   05/29/18 1400  Weight: 205 lb 1.6 oz (93 kg)   .Body mass index is 29.43 kg/m.  GENERAL:alert, in no acute distress and comfortable SKIN: no acute rashes, no significant lesions EYES: conjunctiva are pink and non-injected, sclera anicteric OROPHARYNX: MMM, no exudates, no oropharyngeal erythema or ulceration NECK: supple, no JVD LYMPH:  no palpable lymphadenopathy in the cervical, axillary or inguinal regions LUNGS: clear to auscultation b/l with normal respiratory effort HEART: regular rate & rhythm ABDOMEN:  normoactive bowel sounds , non tender, not distended. No palpable hepatosplenomegaly.  Extremity: no pedal edema PSYCH: alert & oriented x 3 with fluent speech NEURO: no focal motor/sensory deficits   LABORATORY DATA:  I have reviewed the data as listed  . CBC Latest Ref Rng & Units 05/29/2018 04/24/2018 04/23/2018  WBC 4.0 - 10.5 K/uL 6.2 6.6 15.2(H)  Hemoglobin 13.0 - 17.0 g/dL 13.1 12.2(L) 11.3(L)  Hematocrit 39.0 - 52.0 % 39.2 36.5(L) 34.2(L)  Platelets 150 - 400 K/uL 142(L) 72(L) PLATELET CLUMPS NOTED ON SMEAR, UNABLE TO ESTIMATE    CMP Latest Ref Rng & Units 05/29/2018 04/24/2018 04/23/2018  Glucose 70 - 99 mg/dL 115(H) 119(H) 113(H)  BUN 8 - 23 mg/dL '19 23 21  ' Creatinine 0.61 - 1.24 mg/dL 1.69(H) 1.37(H) 1.34(H)  Sodium 135 - 145 mmol/L 142 139 137  Potassium 3.5 - 5.1 mmol/L 3.9 3.8 3.4(L)  Chloride 98 - 111 mmol/L 107 109 106  CO2 22 - 32 mmol/L 27 21(L) 21(L)  Calcium 8.9 - 10.3 mg/dL 9.0 8.2(L) 7.8(L)  Total Protein 6.5 - 8.1 g/dL 6.9 6.7 -  Total Bilirubin 0.3 - 1.2  mg/dL 1.1 1.3(H) -  Alkaline Phos 38 - 126 U/L 72 61 -  AST 15 - 41 U/L 28 48(H) -  ALT 0 - 44 U/L 18 32 -        RADIOGRAPHIC STUDIES: I have personally reviewed the radiological images as listed and agreed with the findings in the report. No results found. Result status: Edited Result - FINAL                              *[facility] [place] [employer].  [place], [place] [address]                            [phone]  ------------------------------------------------------------------- Transthoracic Echocardiography  (Report amended )  Patient:    Alex [name], Alex [name] Alex #:       [id number] Study Date: 08/24/2016 Gender:     M Age:        75 Height:     180.3 cm Weight:     90.6 kg BSA:        2.15 m^2 Pt. Status: Room:   ATTENDING    Darlina Guys, MD  Alex [name], Christopher  REFERRING    [place] [place]  SONOGRAPHER  Roseanna Rainbow  cc:  ------------------------------------------------------------------- LV EF: 65% -   70%  ------------------------------------------------------------------- Indications:      Aortic stenosis 424.1.  ------------------------------------------------------------------- History:   PMH:  Amyloidosis.  Coronary artery disease.  Angina pectoris.  Risk factors:  Hypertension. Dyslipidemia.  ------------------------------------------------------------------- Study Conclusions  - Left ventricle: The cavity size was normal. Wall thickness was   increased in a pattern of mild LVH. Systolic function was   vigorous. The estimated ejection fraction was in the range of 65%   to 70%. Wall motion was normal; there were no regional wall   motion abnormalities. Doppler parameters are consistent with   abnormal left ventricular relaxation (grade 1 diastolic   dysfunction). - Aortic valve: Valve  mobility was mildly restricted. - Aortic root: The aortic root was mildly dilated. - Mitral valve: Calcified annulus.  Impressions:  - Vigorous LV systolic function; mild diastolic dysfunction; mild   LVH; calcified aortic valve with no significant AS (peak velocity   2 m/s; mean gradient 9 mmHg); mildly dilated aortic root.    ASSESSMENT & PLAN:   79 y.o.  male with above-mentioned multiple medical comorbidities with  #1 Biopsy proven Renal and Systemic AL Amyloidosis Initial BM Bx shows 9% plasma cells and amyloid deposits. Echo shows normal systolic function with only grade 1 diastolic dysfunction. Skeletal survey with no lytic lesions suggestive of multiple myeloma. Serum K/L ratio increased at about 6.22 on diagnosis and has now improved to 1.5 (wnl with normal kappa lambda serum free light chain ratio ) UPEP shows 24h protein improved from 15g to 5g daily and the last recent one is about 7.3 g /24h  01/12/18 ECHO revealed LV EF of 60-65%   #2  Nephrotic Syndrome - likely related to AL amyloidosis.  Urine Study 07/26/17 showed Protein at 2.232g.  -07/19/17 the pt had a BM Bx exam which revealed Normocellular bone marrow (30-40%) with 2-3% plasma cells and focal amyloid deposition.  PLAN: -Discussed pt labwork today, 05/29/18; Creatinine increased some to 1.69, HGB normal at 13.1, PLT at 142k -05/29/18 SFLC - normal ratio -05/30/27 24hour UPEP no M spike -463m Acyclovir BID -The pt has no prohibitive toxicities from continuing 379mNinlaro on D1, D8 and D15 at this time. -Pt will increase his salt intake mildly -Offered to set the pt up for our PT, which he prefers  -Pt continues in remission  -Grade 1 neuropathy, will continue to watch this, the pt does not prefer to take any medications for this at this time -Continue Vitamin B complex  -Will continue repeat 24 hour urine study and repeat ECHO every 6 months - Pt will take Dexamethasone 1037mZofran 4-8mg24mydroxyzine- all 30  minutes before the Ninl[employer]  pill.  -Recommended that the pt receive his annual flu vaccine, and continue follow up with his transplant team for his vaccinations  -Use anti-histamines as needed  -Will see the pt back in 2 months   #4 Patient Active Problem List   Diagnosis Date Noted  . Left lower lobe pneumonia ([place]) 04/22/2018  . Sepsis due to pneumonia ([place]) 04/22/2018  . Folliculitis 87/21/5872  . AL amyloidosis ([place]) 08/21/2016  . Umbilical hernia 76/18/4859  . DIZZINESS 02/19/2010  . CAD, NATIVE VESSEL 07/21/2009  . CHEST PAIN-UNSPECIFIED 07/21/2009  . HYPERLIPIDEMIA 04/30/2007  . Essential hypertension 04/30/2007  . GASTROESOPHAGEAL REFLUX DISEASE 04/30/2007  . SEIZURE DISORDER 04/30/2007  . COMPRESSION FRACTURE, L1 VERTEBRA 04/30/2007  . DIVERTICULOSIS, COLON, HX OF 04/30/2007  . Other postprocedural status(V45.89) 04/30/2007  -Continue follow-up with primary care physician for management of other medical co-morbidities. -Recommend if pt experiences spike fever to report to hospital with transplant team    RTC with Dr [name] with labs in 45month referral to Cancer rehab for PT in 1 week    All of the patients and his wife's questions were answered to their apparent satisfaction. They are agreeable with the plan as noted above .The patient knows to call the clinic with any problems, questions or concerns.  The total time spent in the appt was 25 minutes and more than 50% was on counseling and direct patient cares.    GSullivan LoneMD MChevy ChaseAAHIVMS SSpectrum Healthcare Partners D[facility] Centers For OrthopaedicsCGrace Hospital At FairviewHematology/Oncology Physician CMemorial Hermann [facility] (Office):       [phone](Work cell):  [phone](Fax):           [phone] I, SBaldwin Jamaica am acting as a scribe for Dr. GSullivan Lone   .I have reviewed the above documentation for accuracy and completeness, and I agree with the above. .Brunetta GeneraMD

## 2018-05-29 ENCOUNTER — Inpatient Hospital Stay (HOSPITAL_BASED_OUTPATIENT_CLINIC_OR_DEPARTMENT_OTHER): Payer: Medicare HMO | Admitting: Hematology

## 2018-05-29 ENCOUNTER — Inpatient Hospital Stay: Payer: Medicare HMO | Attending: Hematology

## 2018-05-29 ENCOUNTER — Telehealth: Payer: Self-pay | Admitting: Hematology

## 2018-05-29 VITALS — BP 101/75 | HR 77 | Temp 97.9°F | Resp 18 | Ht 70.0 in | Wt 205.1 lb

## 2018-05-29 DIAGNOSIS — Z87891 Personal history of nicotine dependence: Secondary | ICD-10-CM | POA: Diagnosis not present

## 2018-05-29 DIAGNOSIS — E8581 Light chain (AL) amyloidosis: Secondary | ICD-10-CM

## 2018-05-29 DIAGNOSIS — N08 Glomerular disorders in diseases classified elsewhere: Secondary | ICD-10-CM

## 2018-05-29 DIAGNOSIS — Z79899 Other long term (current) drug therapy: Secondary | ICD-10-CM | POA: Diagnosis not present

## 2018-05-29 DIAGNOSIS — I10 Essential (primary) hypertension: Secondary | ICD-10-CM | POA: Insufficient documentation

## 2018-05-29 DIAGNOSIS — N049 Nephrotic syndrome with unspecified morphologic changes: Secondary | ICD-10-CM

## 2018-05-29 DIAGNOSIS — D696 Thrombocytopenia, unspecified: Secondary | ICD-10-CM

## 2018-05-29 DIAGNOSIS — E854 Organ-limited amyloidosis: Secondary | ICD-10-CM

## 2018-05-29 DIAGNOSIS — E8809 Other disorders of plasma-protein metabolism, not elsewhere classified: Secondary | ICD-10-CM

## 2018-05-29 LAB — CBC WITH DIFFERENTIAL/PLATELET
Abs Immature Granulocytes: 0.01 10*3/uL (ref 0.00–0.07)
BASOS ABS: 0 10*3/uL (ref 0.0–0.1)
Basophils Relative: 0 %
Eosinophils Absolute: 0.1 10*3/uL (ref 0.0–0.5)
Eosinophils Relative: 1 %
HCT: 39.2 % (ref 39.0–52.0)
Hemoglobin: 13.1 g/dL (ref 13.0–17.0)
IMMATURE GRANULOCYTES: 0 %
Lymphocytes Relative: 21 %
Lymphs Abs: 1.3 10*3/uL (ref 0.7–4.0)
MCH: 31.5 pg (ref 26.0–34.0)
MCHC: 33.4 g/dL (ref 30.0–36.0)
MCV: 94.2 fL (ref 80.0–100.0)
Monocytes Absolute: 0.6 10*3/uL (ref 0.1–1.0)
Monocytes Relative: 10 %
Neutro Abs: 4.1 10*3/uL (ref 1.7–7.7)
Neutrophils Relative %: 68 %
Platelets: 142 10*3/uL — ABNORMAL LOW (ref 150–400)
RBC: 4.16 MIL/uL — ABNORMAL LOW (ref 4.22–5.81)
RDW: 13.8 % (ref 11.5–15.5)
WBC: 6.2 10*3/uL (ref 4.0–10.5)
nRBC: 0 % (ref 0.0–0.2)

## 2018-05-29 LAB — CMP (CANCER CENTER ONLY)
ALT: 18 U/L (ref 0–44)
ANION GAP: 8 (ref 5–15)
AST: 28 U/L (ref 15–41)
Albumin: 3.7 g/dL (ref 3.5–5.0)
Alkaline Phosphatase: 72 U/L (ref 38–126)
BUN: 19 mg/dL (ref 8–23)
CO2: 27 mmol/L (ref 22–32)
Calcium: 9 mg/dL (ref 8.9–10.3)
Chloride: 107 mmol/L (ref 98–111)
Creatinine: 1.69 mg/dL — ABNORMAL HIGH (ref 0.61–1.24)
GFR, Est AFR Am: 45 mL/min — ABNORMAL LOW (ref >60)
GFR, Estimated: 39 mL/min — ABNORMAL LOW (ref >60)
Glucose, Bld: 115 mg/dL — ABNORMAL HIGH (ref 70–99)
Potassium: 3.9 mmol/L (ref 3.5–5.1)
Sodium: 142 mmol/L (ref 135–145)
Total Bilirubin: 1.1 mg/dL (ref 0.3–1.2)
Total Protein: 6.9 g/dL (ref 6.5–8.1)

## 2018-05-29 NOTE — Telephone Encounter (Signed)
Scheduled appt per 02/04 los. ° °Printed calendar and avs. °

## 2018-05-30 LAB — UPEP/TP, 24-HR URINE
Albumin, U: 49.2 %
Alpha 1, Urine: 3.1 %
Alpha 2, Urine: 8.2 %
Beta, Urine: 36.2 %
Gamma Globulin, Urine: 3.3 %
TOTAL PROTEIN, URINE-UR/DAY: 130 mg/(24.h) (ref 30–150)
Total Protein, Urine: 4.8 mg/dL
Total Volume: 2700

## 2018-05-30 LAB — KAPPA/LAMBDA LIGHT CHAINS
Kappa free light chain: 23.8 mg/L — ABNORMAL HIGH (ref 3.3–19.4)
Kappa, lambda light chain ratio: 1.13 (ref 0.26–1.65)
LAMDA FREE LIGHT CHAINS: 21.1 mg/L (ref 5.7–26.3)

## 2018-05-31 LAB — MULTIPLE MYELOMA PANEL, SERUM
Albumin SerPl Elph-Mcnc: 3.3 g/dL (ref 2.9–4.4)
Albumin/Glob SerPl: 1.2 (ref 0.7–1.7)
Alpha 1: 0.2 g/dL (ref 0.0–0.4)
Alpha2 Glob SerPl Elph-Mcnc: 1 g/dL (ref 0.4–1.0)
B-Globulin SerPl Elph-Mcnc: 0.8 g/dL (ref 0.7–1.3)
GAMMA GLOB SERPL ELPH-MCNC: 0.9 g/dL (ref 0.4–1.8)
GLOBULIN, TOTAL: 2.9 g/dL (ref 2.2–3.9)
IGA: 147 mg/dL (ref 61–437)
IGM (IMMUNOGLOBULIN M), SRM: 35 mg/dL (ref 15–143)
IgG (Immunoglobin G), Serum: 966 mg/dL (ref 700–1600)
M Protein SerPl Elph-Mcnc: 0.2 g/dL — ABNORMAL HIGH
Total Protein ELP: 6.2 g/dL (ref 6.0–8.5)

## 2018-06-05 DIAGNOSIS — E8581 Light chain (AL) amyloidosis: Secondary | ICD-10-CM | POA: Diagnosis not present

## 2018-06-05 DIAGNOSIS — Z9484 Stem cells transplant status: Secondary | ICD-10-CM | POA: Diagnosis not present

## 2018-06-05 DIAGNOSIS — Z23 Encounter for immunization: Secondary | ICD-10-CM | POA: Diagnosis not present

## 2018-06-13 ENCOUNTER — Other Ambulatory Visit: Payer: Self-pay

## 2018-06-13 ENCOUNTER — Ambulatory Visit: Payer: Medicare HMO | Attending: Hematology | Admitting: Rehabilitation

## 2018-06-13 ENCOUNTER — Encounter: Payer: Self-pay | Admitting: Rehabilitation

## 2018-06-13 DIAGNOSIS — R262 Difficulty in walking, not elsewhere classified: Secondary | ICD-10-CM | POA: Diagnosis not present

## 2018-06-13 DIAGNOSIS — R293 Abnormal posture: Secondary | ICD-10-CM | POA: Diagnosis not present

## 2018-06-13 NOTE — Patient Instructions (Signed)
Continue to walk with your wife 0.25 miles 3x per day and return to the gym program you were doing before getting sick 3x per week to do the equipment and the weight machines.  If you are having trouble getting back to the gym set up more appointments here.

## 2018-06-13 NOTE — Therapy (Signed)
[place], [place], [address] Phone: [phone]   Fax:  [phone]  Physical Therapy Evaluation  Patient Details  Name: Alex [name]. MRN: [id number] Date of Birth: 12-28-1940 Referring Provider (PT): Dr [name]   Encounter Date: 06/13/2018  PT End of Session - 06/13/18 1731    Visit Number  1    Number of Visits  1    PT Start Time  6433    PT Stop Time  1555    PT Time Calculation (min)  40 min    Activity Tolerance  Patient tolerated treatment well    Behavior During Therapy  [facility] for tasks assessed/performed       Past Medical History:  Diagnosis Date  . Amyloidosis ([place])   . Arthritis   . Basal cell carcinoma   . CAD (coronary artery disease)    Lexiscan Myoview 6/14: Normal study, no scar or ischemia, EF 62%  . Cataract   . Chronic kidney disease   . Colon polyp   . Coronary artery disease    moderate disease by cath 2011  . Diverticulosis of colon   . GERD (gastroesophageal reflux disease)   . Hearing loss   . Heart disease   . Hiatal hernia   . Hyperlipidemia   . Hypertension   . MRSA (methicillin resistant staph aureus) culture positive   . Seizure disorder ([place])   . Seizures ([place])    last in 1970s    Past Surgical History:  Procedure Laterality Date  . BUNIONECTOMY    . CARPAL TUNNEL RELEASE  1994   left  . CERVICAL FUSION    . compression fracture    . FOOT SURGERY     right -twice, left foot once  . HERNIA REPAIR     Umbilical with PVP  . KNEE ARTHROSCOPY     left knee  . [place]   left - scope  . NASAL SEPTUM SURGERY    . NECK SURGERY    . NOSE SURGERY     twice  . PAROTIDECTOMY Right 03/09/2017  . ROTATOR CUFF REPAIR     twice, both shoulders  . ROTATOR CUFF REPAIR    . SHOULDER SURGERY    . SKIN BIOPSY    . SPINE SURGERY     C2, C3, C4  . TRIGGER FINGER RELEASE     both hands, twice  . TRIGGER FINGER RELEASE    . VASECTOMY       There were no vitals filed for this visit.   Subjective Assessment - 06/13/18 1521    Subjective  They told me I needed to come.  Maybe strength and endurance.     Patient is accompained by:  Family member   pat    Pertinent History  stem cell transplant 04/12/17 in the hospital x 19 days due to plasma cell dyscrasia/AL Amyloidosis  .  Chemotherapy 3x per month neulara pill.  History of hospitalization from pneumonia and kidney infection needing 3-4 weeks in the hospital in 2019.     Limitations  Walking;Standing;House hold activities    How long can you stand comfortably?  5 minutes starts to make the back hurt     How long can you walk comfortably?  0.5 mile 25-40 minutes depending on the day     Patient Stated Goals  improve endurance with dressing and showering activities.      Currently in Pain?  No/denies  [facility] PT Assessment - 06/13/18 0001      Assessment   Medical Diagnosis  plasma cell dyscrasia/AL Amyloidosis     Referring Provider (PT)  Dr [name]    Onset Date/Surgical Date  --   2018   Hand Dominance  Right    Next MD Visit  unknown    Prior Therapy  HHPT      Precautions   Precaution Comments  no overhead due to bad shoulders, don't work in the dirt per pt      Restrictions   Weight Bearing Restrictions  No      Balance Screen   Has the patient fallen in the past 6 months  No    Has the patient had a decrease in activity level because of a fear of falling?   No    Is the patient reluctant to leave their home because of a fear of falling?   No      Home Film/video editor residence    Living Arrangements  Spouse/significant other    Home Access  Stairs to enter    [place]  One level      Prior Function   Level of [place]  Retired    Leisure  walk the Community education officer   Overall Cognitive Status  Within Functional Limits for tasks assessed      Coordination   Gross Motor Movements  are Fluid and Coordinated  Yes      Functional Tests   Functional tests  Sit to Stand      Sit to Stand   Comments  sit to stand in 30 seconds x 11      Posture/Postural Control   Posture/Postural Control  Postural limitations    Postural Limitations  Rounded Shoulders;Forward head;Increased thoracic kyphosis      ROM / Strength   AROM / PROM / Strength  Strength      Strength   Overall Strength Comments  tested seated    Strength Assessment Site  Hip;Knee;Ankle    Right/Left Hip  Right;Left    Right Hip Flexion  4+/5    Right Hip ABduction  4+/5    Right Hip ADduction  4+/5    Left Hip Flexion  4+/5    Left Hip External Rotation  4+/5    Left Hip Internal Rotation  4+/5    Left Hip ABduction  4+/5    Left Hip ADduction  4+/5    Right/Left Knee  Right;Left    Right Knee Flexion  4+/5    Right Knee Extension  4+/5    Left Knee Flexion  4+/5    Left Knee Extension  4+/5    Right/Left Ankle  Right;Left    Right Ankle Dorsiflexion  5/5    Right Ankle Plantar Flexion  5/5    Left Ankle Dorsiflexion  5/5    Left Ankle Plantar Flexion  5/5      6 Minute Walk- Baseline   6 Minute Walk- Baseline  yes      6 Minute walk- Post Test   6 Minute Walk Post Test  yes    Modified Borg Scale for Dyspnea  1- Very mild shortness of breath    Perceived Rate of Exertion (Borg)  8-      6 minute walk test results    Aerobic Endurance Distance Walked  976  Objective measurements completed on examination: See above findings.              PT Education - 06/13/18 1731    Education Details  walking, POC    Person(s) Educated  Patient;Spouse    Methods  Explanation    Comprehension  Verbalized understanding          PT Long Term Goals - 06/13/18 1736      PT LONG TERM GOAL #1   Title  Pt will be ind with home gym and walking program for self care    Time  1    Period  Days    Status  Achieved             Plan - 06/13/18 1732     Clinical Impression Statement  Pt presents to PT today reporting that he is starting to feel much better from recent hospitalization from pneumonia.  He is post stem cell transplant in December of 2018.  He reports low endurance but has been improving.  His MMT revealed strong LEs bilaterally with a normal 30 second sit to stand value at 11 and a slightly lower 10min walk value at 976 feet in 17min.  Upon discusion with pt he reports that he normally goes to the gym 3x per week for aerobic equipment and weight machines and walks .75 miles per day with his dog.  He has not been to the gym recently but thinks going back will be easier for him than driving to PT appts here.  PT agreed with this plan as he is doing so well currently.  Pt will return if he has any issues getting back into his gym activities with his wife.     Clinical Presentation  Stable    Clinical Decision Making  Low    Rehab Potential  Excellent    PT Frequency  One time visit    PT Treatment/Interventions  Patient/family education    Consulted and Agree with Plan of Care  Family member/caregiver;Patient       Patient will benefit from skilled therapeutic intervention in order to improve the following deficits and impairments:  Decreased endurance  Visit Diagnosis: Abnormal posture  Difficulty in walking, not elsewhere classified     Problem List Patient Active Problem List   Diagnosis Date Noted  . Left lower lobe pneumonia ([place]) 04/22/2018  . Sepsis due to pneumonia ([place]) 04/22/2018  . Folliculitis 23/76/2831  . AL amyloidosis ([place]) 08/21/2016  . Umbilical hernia 51/76/1607  . DIZZINESS 02/19/2010  . CAD, NATIVE VESSEL 07/21/2009  . CHEST PAIN-UNSPECIFIED 07/21/2009  . HYPERLIPIDEMIA 04/30/2007  . Essential hypertension 04/30/2007  . GASTROESOPHAGEAL REFLUX DISEASE 04/30/2007  . SEIZURE DISORDER 04/30/2007  . COMPRESSION FRACTURE, L1 VERTEBRA 04/30/2007  . DIVERTICULOSIS, COLON, HX OF 04/30/2007  . Other  postprocedural status(V45.89) 04/30/2007    Shan Levans, PT 06/13/2018, 5:37 PM  [place], [place], [address] Phone: [phone]   Fax:  [phone]  Name: Atley Neubert. MRN: [id number] Date of Birth: Feb 13, 1941

## 2018-06-23 ENCOUNTER — Encounter: Payer: Self-pay | Admitting: Internal Medicine

## 2018-06-27 DIAGNOSIS — E7849 Other hyperlipidemia: Secondary | ICD-10-CM | POA: Diagnosis not present

## 2018-06-27 DIAGNOSIS — N183 Chronic kidney disease, stage 3 (moderate): Secondary | ICD-10-CM | POA: Diagnosis not present

## 2018-06-27 DIAGNOSIS — I10 Essential (primary) hypertension: Secondary | ICD-10-CM | POA: Diagnosis not present

## 2018-06-27 DIAGNOSIS — R82998 Other abnormal findings in urine: Secondary | ICD-10-CM | POA: Diagnosis not present

## 2018-06-27 DIAGNOSIS — Z125 Encounter for screening for malignant neoplasm of prostate: Secondary | ICD-10-CM | POA: Diagnosis not present

## 2018-06-27 DIAGNOSIS — E8581 Light chain (AL) amyloidosis: Secondary | ICD-10-CM | POA: Diagnosis not present

## 2018-06-27 DIAGNOSIS — Z9481 Bone marrow transplant status: Secondary | ICD-10-CM | POA: Diagnosis not present

## 2018-06-27 DIAGNOSIS — R7301 Impaired fasting glucose: Secondary | ICD-10-CM | POA: Diagnosis not present

## 2018-06-27 DIAGNOSIS — I129 Hypertensive chronic kidney disease with stage 1 through stage 4 chronic kidney disease, or unspecified chronic kidney disease: Secondary | ICD-10-CM | POA: Diagnosis not present

## 2018-06-28 ENCOUNTER — Ambulatory Visit: Payer: Medicare HMO | Admitting: Cardiovascular Disease

## 2018-06-28 ENCOUNTER — Encounter: Payer: Self-pay | Admitting: Cardiovascular Disease

## 2018-06-28 VITALS — BP 122/76 | HR 61 | Ht 70.0 in | Wt 204.4 lb

## 2018-06-28 DIAGNOSIS — I10 Essential (primary) hypertension: Secondary | ICD-10-CM | POA: Diagnosis not present

## 2018-06-28 DIAGNOSIS — E7800 Pure hypercholesterolemia, unspecified: Secondary | ICD-10-CM

## 2018-06-28 DIAGNOSIS — I350 Nonrheumatic aortic (valve) stenosis: Secondary | ICD-10-CM

## 2018-06-28 DIAGNOSIS — I25118 Atherosclerotic heart disease of native coronary artery with other forms of angina pectoris: Secondary | ICD-10-CM | POA: Diagnosis not present

## 2018-06-28 NOTE — Patient Instructions (Signed)

## 2018-06-28 NOTE — Progress Notes (Signed)
Chief Complaint  Patient presents with  . Follow-up    CAD   History of Present Illness: 78 yo male with history of amyloidosis with nephrotic syndrome, HTN, moderate non-obstructive CAD by cath 2011, aortic stenosis, GERD, hyperlipidemia and carotid artery disease who is here today for follow up. He had a workup in 2011 for dizziness including carotid artery dopplers (mild bilateral disease, <39%) and normal echo. HCTZ and Coreg were stopped which helped his dizziness. Repeat carotid artery dopplers 04/05/12 with mild bilateral disease. Stress myoview July 2016 with no ischemia. Echo May 2018 with July 2016 with normal LV function, mild aortic stenosis. He was seen in our office March 2017 by Truitt Merle, NP and at that time noted occasional chest pain. Imdur was added. His chest pain improved. He was diagnosed with amyloidosis in April 2018 treated with chemotherapy for 8 months, then stem cell transplant December 2018 followed by chemotherapy.He is on maintenance chemotherapy. Echo September 2019 with LVEF=60-65%. Mild to moderate AS with mean gradient 19 mmHg, peak gradient 35 mmHg.   He is here today for follow up. The patient denies any chest pain, dyspnea, palpitations, lower extremity edema, orthopnea, PND, dizziness, near syncope or syncope.   Primary Care Physician: [name], MD  Past Medical History:  Diagnosis Date  . Amyloidosis ([place])   . Arthritis   . Basal cell carcinoma   . CAD (coronary artery disease)    Lexiscan Myoview 6/14: Normal study, no scar or ischemia, EF 62%  . Cataract   . Chronic kidney disease   . Colon polyp   . Coronary artery disease    moderate disease by cath 2011  . Diverticulosis of colon   . GERD (gastroesophageal reflux disease)   . Hearing loss   . Heart disease   . Hiatal hernia   . Hyperlipidemia   . Hypertension   . MRSA (methicillin resistant staph aureus) culture positive   . Seizure disorder ([place])   . Seizures ([place])    last  in 1970s    Past Surgical History:  Procedure Laterality Date  . BUNIONECTOMY    . CARPAL TUNNEL RELEASE  1994   left  . CERVICAL FUSION    . compression fracture    . FOOT SURGERY     right -twice, left foot once  . HERNIA REPAIR     Umbilical with PVP  . KNEE ARTHROSCOPY     left knee  . [place]   left - scope  . NASAL SEPTUM SURGERY    . NECK SURGERY    . NOSE SURGERY     twice  . PAROTIDECTOMY Right 03/09/2017  . ROTATOR CUFF REPAIR     twice, both shoulders  . ROTATOR CUFF REPAIR    . SHOULDER SURGERY    . SKIN BIOPSY    . SPINE SURGERY     C2, C3, C4  . TRIGGER FINGER RELEASE     both hands, twice  . TRIGGER FINGER RELEASE    . VASECTOMY      Current Outpatient Medications  Medication Sig Dispense Refill  . acyclovir (ZOVIRAX) 400 MG tablet Take 400 mg by mouth 2 (two) times daily.    [name] Kitchen atorvastatin (LIPITOR) 40 MG tablet Take 40 mg by mouth at bedtime.     [name] Kitchen ezetimibe (ZETIA) 10 MG tablet Take 10 mg by mouth daily.     . famotidine (PEPCID) 20 MG tablet Take 2 tablets (40 mg total)  by mouth daily. 60 tablet 1  . folic acid (FOLVITE) 1 MG tablet Take 1 mg by mouth daily.     . isosorbide mononitrate (IMDUR) 30 MG 24 hr tablet Take 15 mg by mouth daily.     . ixazomib citrate (NINLARO) 3 MG capsule Take 1 capsule (3mg ) by mouth once weekly for 3 weeks on, 1 week off, repeat Q 4 weeks. Take on empty stomach 1hr before or 2hrs after food. 3 capsule 2  . losartan (COZAAR) 25 MG tablet Take 25 mg by mouth daily.    . Multiple Vitamin (MULTI-VITAMINS) TABS Take 1 tablet by mouth daily.    . mupirocin ointment (BACTROBAN) 2 % Apply 1 application topically as needed.    . nitroGLYCERIN (NITROSTAT) 0.4 MG SL tablet Place 0.4 mg under the tongue every 5 (five) minutes as needed for chest pain.    [name] Kitchen omeprazole (PRILOSEC) 20 MG capsule Take 20 mg by mouth daily.      . ondansetron (ZOFRAN) 8 MG tablet Take 8 mg by mouth 2 (two) times daily as needed for nausea  or vomiting.    . potassium chloride SA (K-DUR,KLOR-CON) 20 MEQ tablet Take 20 mEq by mouth at bedtime.     . prochlorperazine (COMPAZINE) 10 MG tablet Take 1 tablet (10 mg total) by mouth every 6 (six) hours as needed for nausea or vomiting. 30 tablet 2  . triamcinolone cream (KENALOG) 0.1 % Apply 1 application topically as needed for rash.     No current facility-administered medications for this visit.     Allergies  Allergen Reactions  . Ramipril Cough    Social History   Socioeconomic History  . Marital status: Married    Spouse name: Not on file  . Number of children: 2  . Years of education: 11  . Highest education level: Not on file  Occupational History  . Occupation: Retired  Scientific laboratory technician  . Financial resource strain: Not on file  . Food insecurity:    Worry: Not on file    Inability: Not on file  . Transportation needs:    Medical: Not on file    Non-medical: Not on file  Tobacco Use  . Smoking status: Former Smoker    Last attempt to quit: 04/25/1969    Years since quitting: 49.2  . Smokeless tobacco: Former Network engineer and Sexual Activity  . Alcohol use: Yes    Alcohol/week: 7.0 - 14.0 standard drinks    Types: 7 - 14 Glasses of wine per week    Comment: socially shots of wiskey  . Drug use: No  . Sexual activity: Not on file  Lifestyle  . Physical activity:    Days per week: Not on file    Minutes per session: Not on file  . Stress: Not on file  Relationships  . Social connections:    Talks on phone: Not on file    Gets together: Not on file    Attends religious service: Not on file    Active member of club or organization: Not on file    Attends meetings of clubs or organizations: Not on file    Relationship status: Not on file  . Intimate partner violence:    Fear of current or ex partner: Not on file    Emotionally abused: Not on file    Physically abused: Not on file    Forced sexual activity: Not on file  Other Topics Concern  . Not  on  file  Social History Narrative   ** Merged History Encounter **   Lives at home w/ his wife   Right-handed   Caffeine: occasional Pepsi        Family History  Problem Relation Age of Onset  . Diabetes Father   . Heart disease Father   . Heart attack Father 60  . Hypertension Father   . Cancer Sister   . Hearing loss Mother   . Eczema Sister   . Hyperlipidemia Other   . Diabetes Other   . Hypertension Other   . Seizures Other   . Thyroid disease Other   . Colon cancer Neg Hx   . Stroke Neg Hx     Review of Systems:  As stated in the HPI and otherwise negative.   BP 122/76   Pulse 61   Ht 5\' 10"  (1.778 m)   Wt 204 lb 6.4 oz (92.7 kg)   SpO2 98%   BMI 29.33 kg/m   Physical Examination:  General: Well developed, well nourished, NAD  HEENT: OP clear, mucus membranes moist  SKIN: warm, dry. No rashes. Neuro: No focal deficits  Musculoskeletal: Muscle strength 5/5 all ext  Psychiatric: Mood and affect normal  Neck: No JVD, no carotid bruits, no thyromegaly, no lymphadenopathy.  Lungs:Clear bilaterally, no wheezes, rhonci, crackles Cardiovascular: Regular rate and rhythm. Harsh systolic murmur noted.  Abdomen:Soft. Bowel sounds present. Non-tender.  Extremities: No lower extremity edema. Pulses are 2 + in the bilateral DP/PT.  Echo September 2019: - Left ventricle: The cavity size was normal. There was moderate   concentric hypertrophy. Systolic function was normal. The   estimated ejection fraction was in the range of 60% to 65%. Wall   motion was normal; there were no regional wall motion   abnormalities. Doppler parameters are consistent with abnormal   left ventricular relaxation (grade 1 diastolic dysfunction).   There was no evidence of elevated ventricular filling pressure by   Doppler parameters. - Aortic valve: Trileaflet; severely thickened, severely calcified   leaflets. There was mild to moderate stenosis. There was mild   regurgitation. Mean  gradient (S): 19 mm Hg. Peak gradient (S): 35   mm Hg. - Left atrium: The atrium was mildly dilated. - Right ventricle: The cavity size was normal. Wall thickness was   normal. Systolic function was normal.  EKG:  EKG is not ordered today. The ekg ordered today demonstrates   Recent Labs: 04/22/2018: B Natriuretic Peptide 37.7 04/23/2018: Magnesium 2.3 05/29/2018: ALT 18; BUN 19; Creatinine 1.69; Hemoglobin 13.1; Platelets 142; Potassium 3.9; Sodium 142   Lipid Panel Followed in primary care   Wt Readings from Last 3 Encounters:  06/28/18 204 lb 6.4 oz (92.7 kg)  05/29/18 205 lb 1.6 oz (93 kg)  04/24/18 210 lb (95.3 kg)     Other studies Reviewed: Additional studies/ records that were reviewed today include: . Review of the above records demonstrates:    Assessment and Plan:   1. CAD with stable angina: No change in chronic chest pain. He is known to have moderate CAD by cath in 2011. Stress myoview July 2016 without ischemia.  Will continue ASA, statin and Imdur.   2. HYPERTENSION: BP is controlled.      3. HYPERLIPIDEMIA: Lipids followed in primary care. Continue statin  4. Aortic stenosis: Mild to moderate AS by echo in September 2019. Will repeat echo September 2021.   5. Amyloidosis: He is being followed in oncology. He has had chemotherapy and  a stem cell transplant.   Current medicines are reviewed at length with the patient today.  The patient does not have concerns regarding medicines.  The following changes have been made:  no change  Labs/ tests ordered today include:   No orders of the defined types were placed in this encounter.   Disposition:   FU with me in 12 months.   Signed, [name], MD 06/28/2018 4:08 PM    [facility] [place], [place], [place]  [address] Phone: [phone]; Fax: [phone]

## 2018-07-06 DIAGNOSIS — R7301 Impaired fasting glucose: Secondary | ICD-10-CM | POA: Diagnosis not present

## 2018-07-06 DIAGNOSIS — E7849 Other hyperlipidemia: Secondary | ICD-10-CM | POA: Diagnosis not present

## 2018-07-06 DIAGNOSIS — I10 Essential (primary) hypertension: Secondary | ICD-10-CM | POA: Diagnosis not present

## 2018-07-06 DIAGNOSIS — I714 Abdominal aortic aneurysm, without rupture: Secondary | ICD-10-CM | POA: Diagnosis not present

## 2018-07-06 DIAGNOSIS — Z1339 Encounter for screening examination for other mental health and behavioral disorders: Secondary | ICD-10-CM | POA: Diagnosis not present

## 2018-07-06 DIAGNOSIS — I2510 Atherosclerotic heart disease of native coronary artery without angina pectoris: Secondary | ICD-10-CM | POA: Diagnosis not present

## 2018-07-06 DIAGNOSIS — E8581 Light chain (AL) amyloidosis: Secondary | ICD-10-CM | POA: Diagnosis not present

## 2018-07-06 DIAGNOSIS — R0600 Dyspnea, unspecified: Secondary | ICD-10-CM | POA: Diagnosis not present

## 2018-07-06 DIAGNOSIS — Z1331 Encounter for screening for depression: Secondary | ICD-10-CM | POA: Diagnosis not present

## 2018-07-06 DIAGNOSIS — Z125 Encounter for screening for malignant neoplasm of prostate: Secondary | ICD-10-CM | POA: Diagnosis not present

## 2018-07-06 DIAGNOSIS — I700 Atherosclerosis of aorta: Secondary | ICD-10-CM | POA: Diagnosis not present

## 2018-07-06 DIAGNOSIS — N049 Nephrotic syndrome with unspecified morphologic changes: Secondary | ICD-10-CM | POA: Diagnosis not present

## 2018-07-06 DIAGNOSIS — Z0000 Encounter for general adult medical examination without abnormal findings: Secondary | ICD-10-CM | POA: Diagnosis not present

## 2018-07-09 ENCOUNTER — Ambulatory Visit: Payer: Medicare HMO | Admitting: Cardiovascular Disease

## 2018-07-10 ENCOUNTER — Encounter: Payer: Self-pay | Admitting: Internal Medicine

## 2018-07-26 DIAGNOSIS — R509 Fever, unspecified: Secondary | ICD-10-CM | POA: Diagnosis not present

## 2018-07-26 DIAGNOSIS — R0789 Other chest pain: Secondary | ICD-10-CM | POA: Diagnosis not present

## 2018-07-26 DIAGNOSIS — E8581 Light chain (AL) amyloidosis: Secondary | ICD-10-CM | POA: Diagnosis not present

## 2018-07-26 DIAGNOSIS — J309 Allergic rhinitis, unspecified: Secondary | ICD-10-CM | POA: Diagnosis not present

## 2018-07-26 DIAGNOSIS — R05 Cough: Secondary | ICD-10-CM | POA: Diagnosis not present

## 2018-07-30 ENCOUNTER — Other Ambulatory Visit: Payer: Self-pay

## 2018-07-30 ENCOUNTER — Inpatient Hospital Stay: Payer: Medicare HMO | Attending: Hematology

## 2018-07-30 ENCOUNTER — Inpatient Hospital Stay (HOSPITAL_BASED_OUTPATIENT_CLINIC_OR_DEPARTMENT_OTHER): Payer: Medicare HMO | Admitting: Hematology

## 2018-07-30 VITALS — BP 118/79 | HR 66 | Temp 98.0°F | Resp 19 | Ht 70.0 in | Wt 201.3 lb

## 2018-07-30 DIAGNOSIS — Z87891 Personal history of nicotine dependence: Secondary | ICD-10-CM | POA: Insufficient documentation

## 2018-07-30 DIAGNOSIS — E8581 Light chain (AL) amyloidosis: Secondary | ICD-10-CM

## 2018-07-30 DIAGNOSIS — I10 Essential (primary) hypertension: Secondary | ICD-10-CM

## 2018-07-30 DIAGNOSIS — N049 Nephrotic syndrome with unspecified morphologic changes: Secondary | ICD-10-CM | POA: Insufficient documentation

## 2018-07-30 DIAGNOSIS — E8809 Other disorders of plasma-protein metabolism, not elsewhere classified: Secondary | ICD-10-CM

## 2018-07-30 DIAGNOSIS — Z79899 Other long term (current) drug therapy: Secondary | ICD-10-CM

## 2018-07-30 LAB — CMP (CANCER CENTER ONLY)
ALT: 17 U/L (ref 0–44)
AST: 31 U/L (ref 15–41)
Albumin: 3.6 g/dL (ref 3.5–5.0)
Alkaline Phosphatase: 71 U/L (ref 38–126)
Anion gap: 8 (ref 5–15)
BUN: 17 mg/dL (ref 8–23)
CO2: 28 mmol/L (ref 22–32)
Calcium: 8.8 mg/dL — ABNORMAL LOW (ref 8.9–10.3)
Chloride: 104 mmol/L (ref 98–111)
Creatinine: 1.45 mg/dL — ABNORMAL HIGH (ref 0.61–1.24)
GFR, Est AFR Am: 54 mL/min — ABNORMAL LOW (ref >60)
GFR, Estimated: 47 mL/min — ABNORMAL LOW (ref >60)
Glucose, Bld: 86 mg/dL (ref 70–99)
Potassium: 4.1 mmol/L (ref 3.5–5.1)
Sodium: 140 mmol/L (ref 135–145)
Total Bilirubin: 0.8 mg/dL (ref 0.3–1.2)
Total Protein: 6.8 g/dL (ref 6.5–8.1)

## 2018-07-30 LAB — CBC WITH DIFFERENTIAL/PLATELET
Abs Immature Granulocytes: 0 10*3/uL (ref 0.00–0.07)
Basophils Absolute: 0 10*3/uL (ref 0.0–0.1)
Basophils Relative: 0 %
Eosinophils Absolute: 0.1 10*3/uL (ref 0.0–0.5)
Eosinophils Relative: 2 %
HCT: 39.4 % (ref 39.0–52.0)
Hemoglobin: 13.1 g/dL (ref 13.0–17.0)
Immature Granulocytes: 0 %
Lymphocytes Relative: 24 %
Lymphs Abs: 1.3 10*3/uL (ref 0.7–4.0)
MCH: 30.7 pg (ref 26.0–34.0)
MCHC: 33.2 g/dL (ref 30.0–36.0)
MCV: 92.3 fL (ref 80.0–100.0)
Monocytes Absolute: 0.4 10*3/uL (ref 0.1–1.0)
Monocytes Relative: 8 %
Neutro Abs: 3.4 10*3/uL (ref 1.7–7.7)
Neutrophils Relative %: 66 %
Platelets: 108 10*3/uL — ABNORMAL LOW (ref 150–400)
RBC: 4.27 MIL/uL (ref 4.22–5.81)
RDW: 13.4 % (ref 11.5–15.5)
WBC: 5.2 10*3/uL (ref 4.0–10.5)
nRBC: 0 % (ref 0.0–0.2)

## 2018-07-30 MED ORDER — IXAZOMIB CITRATE 3 MG PO CAPS: ORAL_CAPSULE | ORAL | 2 refills | Status: DC

## 2018-07-30 NOTE — Progress Notes (Signed)
HEMATOLOGY/ONCOLOGY CLINIC NOTE  Date of Service: 07/30/18   Patient Care Team: [name], MD as PCP - General (Internal Medicine) Burnell Blanks, MD as PCP - Cardiology (Cardiology) Brunetta Genera, MD as Consulting Physician (Hematology) Nephrology - Dr Madelon Lips MD Dr Henrene Pastor MD - GI  CHIEF COMPLAINTS/PURPOSE OF CONSULTATION:  AL Amyloidosis -with Nephrotic syndrome . No overt CHF pEF  HISTORY OF PRESENTING ILLNESS:   Alex [name]. is a wonderful 78 y.o. male who has been referred to [place] by Dr .[name], MD /Dr Madelon Lips MD for evaluation and management of newly diagnosed Kidney AL Amyloidosis with nephrotic syndrome.  Patient has a history of hypertension, dyslipidemia, coronary artery disease, seizure disorder on Dilantin who was apparently in his usual state of health until 3 months ago when he started developing new onset lower extremity edema. Patient had a UA at the time that showed 4+ protein in 24-hour collection revealed 15 g of protein. Additional workup showed a creatinine of 0.8 with an albumin of 2.3 and negative SPEP and UPEP. Apparently had an elevated K/L SFLC ratio of 5.19.  He was urgently referred by his primary care physician to nephrology for additional evaluation. Patient notes no bleeding issues. No nosebleeds. No periorbital bleeding. No abnormal skin rashes. Has had significant NSAID exposure and use to take a fair amount of naproxen for chronic low back pain but has cut down on this.  Due to lack of clear etiology for his nephrotic syndrome the patient underwent a kidney biopsy on 08/06/2015 accession [id number] which showed AL amyloidosis, lambda immunophenotype, [place] red positive. Noted to have severe arteriosclerosis with moderate tubular interstitial scarring.  Patient was referred to [place] for further evaluation and treatment of his AL Amyloidosis, Patient notes that his leg swelling has improved with diuretic  therapy.  He notes no weight loss. No night sweats. No focal bone pains. No skin rashes. Lungs normal bleeding or bruising.  Patient notes that he had a lot of reading online and he and his wife had an extensive list of questions which were answered in detail.  He notes he has had some chronic issues with upper abdominal pain and nausea and that he follows with Dr. Henrene Pastor and is apparently being scheduled for an EGD and colonoscopy according to his report.  Denies having and lacks tongue. No chest pain and no overt new shortness of breath. Patient is uncertain if an echocardiogram has been ordered or scheduled.  INTERVAL HISTORY  [name] is here for a scheduled follow-up for his AL amyloidosis. The patient's last visit with [place] was on 05/29/18. The pt reports that he is doing well overall.   The pt reports that he has not developed any new concerns in the interim. He notes that he continues tolerating 66m Ninlaro well. He notes that he has been staying home to avoid individuals with infections. The pt denies new skin rashes. He notes that he is eating well and has had stable weight. He denies concerns for infections and endorses good energy levels.  Lab results today (07/30/18) of CBC w/diff and CMP is as follows: all values are WNL except for PLT at 108k, Creatinine at 1.45, Calcium at 8.8, GFR at 47. 07/30/18 MMP and SFLC are pending  On review of systems, pt reports good energy levels, eating well, occasional dry mouth, and denies concerns for infections, abdominal pains, skin rashes, mouth sores, leg swelling, and any other symptoms.   MEDICAL  HISTORY:  Past Medical History:  Diagnosis Date  . Amyloidosis ([place])   . Arthritis   . Basal cell carcinoma   . CAD (coronary artery disease)    Lexiscan Myoview 6/14: Normal study, no scar or ischemia, EF 62%  . Cataract   . Chronic kidney disease   . Colon polyp   . Coronary artery disease    moderate disease by cath 2011  .  Diverticulosis of colon   . GERD (gastroesophageal reflux disease)   . Hearing loss   . Heart disease   . Hiatal hernia   . Hyperlipidemia   . Hypertension   . MRSA (methicillin resistant staph aureus) culture positive   . Seizure disorder ([place])   . Seizures ([place])    last in 1970s  Obstructive sleep apnea Gastroesophageal reflux disease Aortic stenosis Erectile dysfunction Peripheral neuropathy Seizure disorder   SURGICAL HISTORY: Past Surgical History:  Procedure Laterality Date  . BUNIONECTOMY    . CARPAL TUNNEL RELEASE  1994   left  . CERVICAL FUSION    . compression fracture    . FOOT SURGERY     right -twice, left foot once  . HERNIA REPAIR     Umbilical with PVP  . KNEE ARTHROSCOPY     left knee  . [place]   left - scope  . NASAL SEPTUM SURGERY    . NECK SURGERY    . NOSE SURGERY     twice  . PAROTIDECTOMY Right 03/09/2017  . ROTATOR CUFF REPAIR     twice, both shoulders  . ROTATOR CUFF REPAIR    . SHOULDER SURGERY    . SKIN BIOPSY    . SPINE SURGERY     C2, C3, C4  . TRIGGER FINGER RELEASE     both hands, twice  . TRIGGER FINGER RELEASE    . VASECTOMY      SOCIAL HISTORY: Social History   Socioeconomic History  . Marital status: Married    Spouse name: Not on file  . Number of children: 2  . Years of education: 84  . Highest education level: Not on file  Occupational History  . Occupation: Retired  Scientific laboratory technician  . Financial resource strain: Not on file  . Food insecurity:    Worry: Not on file    Inability: Not on file  . Transportation needs:    Medical: Not on file    Non-medical: Not on file  Tobacco Use  . Smoking status: Former Smoker    Last attempt to quit: 04/25/1969    Years since quitting: 49.2  . Smokeless tobacco: Former Network engineer and Sexual Activity  . Alcohol use: Yes    Alcohol/week: 7.0 - 14.0 standard drinks    Types: 7 - 14 Glasses of wine per week    Comment: socially shots of wiskey  . Drug  use: No  . Sexual activity: Not on file  Lifestyle  . Physical activity:    Days per week: Not on file    Minutes per session: Not on file  . Stress: Not on file  Relationships  . Social connections:    Talks on phone: Not on file    Gets together: Not on file    Attends religious service: Not on file    Active member of club or organization: Not on file    Attends meetings of clubs or organizations: Not on file    Relationship status: Not on file  .  Intimate partner violence:    Fear of current or ex partner: Not on file    Emotionally abused: Not on file    Physically abused: Not on file    Forced sexual activity: Not on file  Other Topics Concern  . Not on file  Social History Narrative   ** Merged History Encounter **   Lives at home w/ his wife   Right-handed   Caffeine: occasional Pepsi      Patient is currently retired.   FAMILY HISTORY: Family History  Problem Relation Age of Onset  . Diabetes Father   . Heart disease Father   . Heart attack Father 16  . Hypertension Father   . Cancer Sister   . Hearing loss Mother   . Eczema Sister   . Hyperlipidemia Other   . Diabetes Other   . Hypertension Other   . Seizures Other   . Thyroid disease Other   . Colon cancer Neg Hx   . Stroke Neg Hx     ALLERGIES:  is allergic to ramipril.  MEDICATIONS:  Current Outpatient Medications  Medication Sig Dispense Refill  . acyclovir (ZOVIRAX) 400 MG tablet Take 400 mg by mouth 2 (two) times daily.    [name] Kitchen atorvastatin (LIPITOR) 40 MG tablet Take 40 mg by mouth at bedtime.     [name] Kitchen ezetimibe (ZETIA) 10 MG tablet Take 10 mg by mouth daily.     . famotidine (PEPCID) 20 MG tablet Take 2 tablets (40 mg total) by mouth daily. 60 tablet 1  . folic acid (FOLVITE) 1 MG tablet Take 1 mg by mouth daily.     . isosorbide mononitrate (IMDUR) 30 MG 24 hr tablet Take 15 mg by mouth daily.     . ixazomib citrate (NINLARO) 3 MG capsule Take 1 capsule (58m) by mouth once weekly for 3 weeks  on, 1 week off, repeat Q 4 weeks. Take on empty stomach 1hr before or 2hrs after food. 3 capsule 2  . losartan (COZAAR) 25 MG tablet Take 25 mg by mouth daily.    . Multiple Vitamin (MULTI-VITAMINS) TABS Take 1 tablet by mouth daily.    . mupirocin ointment (BACTROBAN) 2 % Apply 1 application topically as needed.    . nitroGLYCERIN (NITROSTAT) 0.4 MG SL tablet Place 0.4 mg under the tongue every 5 (five) minutes as needed for chest pain.    .[name] Kitchenomeprazole (PRILOSEC) 20 MG capsule Take 20 mg by mouth daily.      . ondansetron (ZOFRAN) 8 MG tablet Take 8 mg by mouth 2 (two) times daily as needed for nausea or vomiting.    . potassium chloride SA (K-DUR,KLOR-CON) 20 MEQ tablet Take 20 mEq by mouth at bedtime.     . prochlorperazine (COMPAZINE) 10 MG tablet Take 1 tablet (10 mg total) by mouth every 6 (six) hours as needed for nausea or vomiting. 30 tablet 2  . triamcinolone cream (KENALOG) 0.1 % Apply 1 application topically as needed for rash.     No current facility-administered medications for this visit.     REVIEW OF SYSTEMS:    A 10+ POINT REVIEW OF SYSTEMS WAS OBTAINED including neurology, dermatology, psychiatry, cardiac, respiratory, lymph, extremities, GI, GU, Musculoskeletal, constitutional, breasts, reproductive, HEENT.  All pertinent positives are noted in the HPI.  All others are negative.   PHYSICAL EXAMINATION: ECOG PERFORMANCE STATUS: 1 - Symptomatic but completely ambulatory  Vitals:   07/30/18 1422  BP: 118/79  Pulse: 66  Resp: 19  Temp: 98 F (36.7 C)  SpO2: 100%   Filed Weights   07/30/18 1422  Weight: 201 lb 4.8 oz (91.3 kg)   .Body mass index is 28.88 kg/m.  GENERAL:alert, in no acute distress and comfortable SKIN: no acute rashes, no significant lesions EYES: conjunctiva are pink and non-injected, sclera anicteric OROPHARYNX: MMM, no exudates, no oropharyngeal erythema or ulceration NECK: supple, no JVD LYMPH:  no palpable lymphadenopathy in the cervical,  axillary or inguinal regions LUNGS: clear to auscultation b/l with normal respiratory effort HEART: regular rate & rhythm ABDOMEN:  normoactive bowel sounds , non tender, not distended. No palpable hepatosplenomegaly.  Extremity: no pedal edema PSYCH: alert & oriented x 3 with fluent speech NEURO: no focal motor/sensory deficits   LABORATORY DATA:  I have reviewed the data as listed  . CBC Latest Ref Rng & Units 07/30/2018 05/29/2018 04/24/2018  WBC 4.0 - 10.5 K/uL 5.2 6.2 6.6  Hemoglobin 13.0 - 17.0 g/dL 13.1 13.1 12.2(L)  Hematocrit 39.0 - 52.0 % 39.4 39.2 36.5(L)  Platelets 150 - 400 K/uL 108(L) 142(L) 72(L)    CMP Latest Ref Rng & Units 07/30/2018 05/29/2018 04/24/2018  Glucose 70 - 99 mg/dL 86 115(H) 119(H)  BUN 8 - 23 mg/dL '17 19 23  ' Creatinine 0.61 - 1.24 mg/dL 1.45(H) 1.69(H) 1.37(H)  Sodium 135 - 145 mmol/L 140 142 139  Potassium 3.5 - 5.1 mmol/L 4.1 3.9 3.8  Chloride 98 - 111 mmol/L 104 107 109  CO2 22 - 32 mmol/L 28 27 21(L)  Calcium 8.9 - 10.3 mg/dL 8.8(L) 9.0 8.2(L)  Total Protein 6.5 - 8.1 g/dL 6.8 6.9 6.7  Total Bilirubin 0.3 - 1.2 mg/dL 0.8 1.1 1.3(H)  Alkaline Phos 38 - 126 U/L 71 72 61  AST 15 - 41 U/L 31 28 48(H)  ALT 0 - 44 U/L 17 18 32        RADIOGRAPHIC STUDIES: I have personally reviewed the radiological images as listed and agreed with the findings in the report. No results found. Result status: Edited Result - FINAL                              *[facility] [employer].                        [place], [place] [address]                            [phone]  ------------------------------------------------------------------- Transthoracic Echocardiography  (Report amended )  Patient:    Quin, Mathenia MR #:       [id number] Study Date: 08/24/2016 Gender:     M Age:        39 Height:     180.3 cm Weight:     90.6 kg BSA:        2.15 m^2 Pt. Status: Room:    ATTENDING    Darlina Guys, MD  Willia Craze, [place], [place], Outpatient  SONOGRAPHER  Roseanna Rainbow  cc:  ------------------------------------------------------------------- LV EF:  65% -   70%  ------------------------------------------------------------------- Indications:      Aortic stenosis 424.1.  ------------------------------------------------------------------- History:   PMH:  Amyloidosis.  Coronary artery disease.  Angina pectoris.  Risk factors:  Hypertension. Dyslipidemia.  ------------------------------------------------------------------- Study Conclusions  - Left ventricle: The cavity size was normal. Wall thickness was   increased in a pattern of mild LVH. Systolic function was   vigorous. The estimated ejection fraction was in the range of 65%   to 70%. Wall motion was normal; there were no regional wall   motion abnormalities. Doppler parameters are consistent with   abnormal left ventricular relaxation (grade 1 diastolic   dysfunction). - Aortic valve: Valve mobility was mildly restricted. - Aortic root: The aortic root was mildly dilated. - Mitral valve: Calcified annulus.  Impressions:  - Vigorous LV systolic function; mild diastolic dysfunction; mild   LVH; calcified aortic valve with no significant AS (peak velocity   2 m/s; mean gradient 9 mmHg); mildly dilated aortic root.    ASSESSMENT & PLAN:   78 y.o.  male with above-mentioned multiple medical comorbidities with  #1 Biopsy proven Renal and Systemic AL Amyloidosis Initial BM Bx shows 9% plasma cells and amyloid deposits. Echo shows normal systolic function with only grade 1 diastolic dysfunction. Skeletal survey with no lytic lesions suggestive of multiple myeloma. Serum K/L ratio increased at about 6.22 on diagnosis and has now improved to 1.5 (wnl with normal kappa lambda serum free light chain ratio ) UPEP shows 24h  protein improved from 15g to 5g daily 05/29/18 24hour UPEP no M spike   01/12/18 ECHO revealed LV EF of 60-65%   #2  Nephrotic Syndrome - likely related to AL amyloidosis.  Urine Study 07/26/17 showed Protein at 2.232g.  -07/19/17 the pt had a BM Bx exam which revealed Normocellular bone marrow (30-40%) with 2-3% plasma cells and focal amyloid deposition. UPEP 05/29/2018 - showed no M spike and total urinary protein of 155m/24h -- resolution of nephrotic syndrome. PLAN: -Discussed pt labwork today, 07/30/18; blood counts and chemistries are stable -07/30/18 MMP and SFLC are WNL -The pt has no prohibitive toxicities from continuing 345mNinlaro on D1, D8, and D15 at this time. - Pt will take Dexamethasone 1021mZofran 4-8mg22mydroxyzine- all 30 minutes before the Ninlaro pill.  -Grade 1 neuropathy- minimal symptoms- some improvement, will continue to watch this, the pt does not prefer to take any medications for this at this time -Continue 400mg14mclovir BID -Continue Vitamin B complex  -Will continue repeat 24 hour urine study and repeat ECHO every 6 months -Recommended that the pt receive his annual flu vaccine, and continue follow up with his transplant team for his vaccinations  -Use anti-histamines as needed  -Will see the pt back in 2 months  #4 Patient Active Problem List   Diagnosis Date Noted  . Left lower lobe pneumonia (HCC) [place]29/2019  . Sepsis due to pneumonia (HCC) [place]29/2019  . Folliculitis 10/331/35/4562L amyloidosis (HCC) [place]29/2018  . Umbilical hernia 10/1854/38/9373IZZINESS 02/19/2010  . CAD, NATIVE VESSEL 07/21/2009  . CHEST PAIN-UNSPECIFIED 07/21/2009  . HYPERLIPIDEMIA 04/30/2007  . Essential hypertension 04/30/2007  . GASTROESOPHAGEAL REFLUX DISEASE 04/30/2007  . SEIZURE DISORDER 04/30/2007  . COMPRESSION FRACTURE, L1 VERTEBRA 04/30/2007  . DIVERTICULOSIS, COLON, HX OF 04/30/2007  . Other postprocedural status(V45.89) 04/30/2007  -Continue follow-up with primary  care physician for management of other medical co-morbidities. -Recommend if pt experiences spike fever to report to hospital with transplant team  RTC with Dr [name] in 2 months with labs   The total time spent in the appt was 25 minutes and more than 50% was on counseling and direct patient cares.    Sullivan Lone MD [place] AAHIVMS [facility] Hematology/Oncology Physician [facility]  (Office):       [phone] (Work cell):  [phone] (Fax):           [phone]  I, Baldwin [place], am acting as a scribe for Dr. Sullivan Lone.   .I have reviewed the above documentation for accuracy and completeness, and I agree with the above. Brunetta Genera MD

## 2018-07-31 ENCOUNTER — Telehealth: Payer: Self-pay | Admitting: Hematology

## 2018-07-31 ENCOUNTER — Telehealth: Payer: Self-pay | Admitting: *Deleted

## 2018-07-31 LAB — MULTIPLE MYELOMA PANEL, SERUM
Albumin SerPl Elph-Mcnc: 3.3 g/dL (ref 2.9–4.4)
Albumin/Glob SerPl: 1.2 (ref 0.7–1.7)
Alpha 1: 0.3 g/dL (ref 0.0–0.4)
Alpha2 Glob SerPl Elph-Mcnc: 1 g/dL (ref 0.4–1.0)
B-Globulin SerPl Elph-Mcnc: 0.9 g/dL (ref 0.7–1.3)
Gamma Glob SerPl Elph-Mcnc: 0.8 g/dL (ref 0.4–1.8)
Globulin, Total: 3 g/dL (ref 2.2–3.9)
IgA: 108 mg/dL (ref 61–437)
IgG (Immunoglobin G), Serum: 940 mg/dL (ref 603–1613)
IgM (Immunoglobulin M), Srm: 31 mg/dL (ref 15–143)
Total Protein ELP: 6.3 g/dL (ref 6.0–8.5)

## 2018-07-31 LAB — KAPPA/LAMBDA LIGHT CHAINS
Kappa free light chain: 22 mg/L — ABNORMAL HIGH (ref 3.3–19.4)
Kappa, lambda light chain ratio: 1.38 (ref 0.26–1.65)
Lambda free light chains: 15.9 mg/L (ref 5.7–26.3)

## 2018-07-31 NOTE — Telephone Encounter (Signed)
Scheduled appt per 4/6 los 

## 2018-07-31 NOTE — Telephone Encounter (Signed)
Faxed prescription for Ninlaro to [place] as requested by patient to [phone]. Fax confirmation received.

## 2018-08-15 ENCOUNTER — Encounter: Payer: Medicare HMO | Admitting: Internal Medicine

## 2018-08-28 DIAGNOSIS — K219 Gastro-esophageal reflux disease without esophagitis: Secondary | ICD-10-CM | POA: Diagnosis not present

## 2018-08-28 DIAGNOSIS — I129 Hypertensive chronic kidney disease with stage 1 through stage 4 chronic kidney disease, or unspecified chronic kidney disease: Secondary | ICD-10-CM | POA: Diagnosis not present

## 2018-08-28 DIAGNOSIS — N183 Chronic kidney disease, stage 3 (moderate): Secondary | ICD-10-CM | POA: Diagnosis not present

## 2018-08-28 DIAGNOSIS — R0789 Other chest pain: Secondary | ICD-10-CM | POA: Diagnosis not present

## 2018-09-24 DIAGNOSIS — J8410 Pulmonary fibrosis, unspecified: Secondary | ICD-10-CM

## 2018-09-24 DIAGNOSIS — Z8616 Personal history of COVID-19: Secondary | ICD-10-CM

## 2018-09-24 HISTORY — DX: Personal history of COVID-19: Z86.16

## 2018-09-24 HISTORY — DX: Pulmonary fibrosis, unspecified: J84.10

## 2018-09-26 DIAGNOSIS — N183 Chronic kidney disease, stage 3 (moderate): Secondary | ICD-10-CM | POA: Diagnosis not present

## 2018-09-26 DIAGNOSIS — K219 Gastro-esophageal reflux disease without esophagitis: Secondary | ICD-10-CM | POA: Diagnosis not present

## 2018-09-26 DIAGNOSIS — I129 Hypertensive chronic kidney disease with stage 1 through stage 4 chronic kidney disease, or unspecified chronic kidney disease: Secondary | ICD-10-CM | POA: Diagnosis not present

## 2018-09-28 NOTE — Progress Notes (Signed)
HEMATOLOGY/ONCOLOGY CLINIC NOTE  Date of Service: 10/01/18   Patient Care Team: [name], MD as PCP - General (Internal Medicine) Burnell Blanks, MD as PCP - Cardiology (Cardiology) Brunetta Genera, MD as Consulting Physician (Hematology) Nephrology - Dr Madelon Lips MD Dr Henrene Pastor MD - GI  CHIEF COMPLAINTS/PURPOSE OF CONSULTATION:  AL Amyloidosis -with Nephrotic syndrome . No overt CHF pEF  HISTORY OF PRESENTING ILLNESS:   Alex [name]. is a wonderful 78 y.o. male who has been referred to [place] by Dr .[name], MD /Dr Madelon Lips MD for evaluation and management of newly diagnosed Kidney AL Amyloidosis with nephrotic syndrome.  Patient has a history of hypertension, dyslipidemia, coronary artery disease, seizure disorder on Dilantin who was apparently in his usual state of health until 3 months ago when he started developing new onset lower extremity edema. Patient had a UA at the time that showed 4+ protein in 24-hour collection revealed 15 g of protein. Additional workup showed a creatinine of 0.8 with an albumin of 2.3 and negative SPEP and UPEP. Apparently had an elevated K/L SFLC ratio of 5.19.  He was urgently referred by his primary care physician to nephrology for additional evaluation. Patient notes no bleeding issues. No nosebleeds. No periorbital bleeding. No abnormal skin rashes. Has had significant NSAID exposure and use to take a fair amount of naproxen for chronic low back pain but has cut down on this.  Due to lack of clear etiology for his nephrotic syndrome the patient underwent a kidney biopsy on 08/06/2015 accession [id number] which showed AL amyloidosis, lambda immunophenotype, [place] red positive. Noted to have severe arteriosclerosis with moderate tubular interstitial scarring.  Patient was referred to [place] for further evaluation and treatment of his AL Amyloidosis, Patient notes that his leg swelling has improved with diuretic  therapy.  He notes no weight loss. No night sweats. No focal bone pains. No skin rashes. Lungs normal bleeding or bruising.  Patient notes that he had a lot of reading online and he and his wife had an extensive list of questions which were answered in detail.  He notes he has had some chronic issues with upper abdominal pain and nausea and that he follows with Dr. Henrene Pastor and is apparently being scheduled for an EGD and colonoscopy according to his report.  Denies having and lacks tongue. No chest pain and no overt new shortness of breath. Patient is uncertain if an echocardiogram has been ordered or scheduled.  INTERVAL HISTORY  [name] is here for a scheduled follow-up for his AL amyloidosis. The patient's last visit with [place] was on 07/30/18. The pt reports that he is doing well overall.  The pt reports that he has continued on Ninlaro. He endorses some mild nausea which he associates with Ninlaro, but continues on anti-nausea medications. He notes that he has not had any skin rashes in the interim.   He notes that he has been belching more frequently, and denies acid reflux. He denies drinking carbonated beverages frequently, but has bene chewing gum. He notes that he has also had some mild constipation and plans to begin a stool softener soon.  Lab results today (10/01/18) of CBC w/diff and CMP is as follows: all values are WNL except for PLT at 121k, Glucose at 107, Creatinine at 1.43, Calcium at 8.5, GFR at 48. 10/01/18 MMP and SFLC are pending  On review of systems, pt reports good energy levels, eating well, more frequent belching, mild  constipation, and denies skin rashes, acid reflux, mouth sores, throat discomfort, new pain along the spine, abdominal pains, diarrhea, and any other symptoms.    MEDICAL HISTORY:  Past Medical History:  Diagnosis Date  . Amyloidosis ([place])   . Arthritis   . Basal cell carcinoma   . CAD (coronary artery disease)    Lexiscan Myoview 6/14: Normal  study, no scar or ischemia, EF 62%  . Cataract   . Chronic kidney disease   . Colon polyp   . Coronary artery disease    moderate disease by cath 2011  . Diverticulosis of colon   . GERD (gastroesophageal reflux disease)   . Hearing loss   . Heart disease   . Hiatal hernia   . Hyperlipidemia   . Hypertension   . MRSA (methicillin resistant staph aureus) culture positive   . Seizure disorder ([place])   . Seizures ([place])    last in 1970s  Obstructive sleep apnea Gastroesophageal reflux disease Aortic stenosis Erectile dysfunction Peripheral neuropathy Seizure disorder   SURGICAL HISTORY: Past Surgical History:  Procedure Laterality Date  . BUNIONECTOMY    . CARPAL TUNNEL RELEASE  1994   left  . CERVICAL FUSION    . compression fracture    . FOOT SURGERY     right -twice, left foot once  . HERNIA REPAIR     Umbilical with PVP  . KNEE ARTHROSCOPY     left knee  . [place]   left - scope  . NASAL SEPTUM SURGERY    . NECK SURGERY    . NOSE SURGERY     twice  . PAROTIDECTOMY Right 03/09/2017  . ROTATOR CUFF REPAIR     twice, both shoulders  . ROTATOR CUFF REPAIR    . SHOULDER SURGERY    . SKIN BIOPSY    . SPINE SURGERY     C2, C3, C4  . TRIGGER FINGER RELEASE     both hands, twice  . TRIGGER FINGER RELEASE    . VASECTOMY      SOCIAL HISTORY: Social History   Socioeconomic History  . Marital status: Married    Spouse name: Not on file  . Number of children: 2  . Years of education: 37  . Highest education level: Not on file  Occupational History  . Occupation: Retired  Scientific laboratory technician  . Financial resource strain: Not on file  . Food insecurity:    Worry: Not on file    Inability: Not on file  . Transportation needs:    Medical: Not on file    Non-medical: Not on file  Tobacco Use  . Smoking status: Former Smoker    Last attempt to quit: 04/25/1969    Years since quitting: 49.4  . Smokeless tobacco: Former Network engineer and Sexual Activity   . Alcohol use: Yes    Alcohol/week: 7.0 - 14.0 standard drinks    Types: 7 - 14 Glasses of wine per week    Comment: socially shots of wiskey  . Drug use: No  . Sexual activity: Not on file  Lifestyle  . Physical activity:    Days per week: Not on file    Minutes per session: Not on file  . Stress: Not on file  Relationships  . Social connections:    Talks on phone: Not on file    Gets together: Not on file    Attends religious service: Not on file    Active member of  club or organization: Not on file    Attends meetings of clubs or organizations: Not on file    Relationship status: Not on file  . Intimate partner violence:    Fear of current or ex partner: Not on file    Emotionally abused: Not on file    Physically abused: Not on file    Forced sexual activity: Not on file  Other Topics Concern  . Not on file  Social History Narrative   ** Merged History Encounter **   Lives at home w/ his wife   Right-handed   Caffeine: occasional Pepsi      Patient is currently retired.   FAMILY HISTORY: Family History  Problem Relation Age of Onset  . Diabetes Father   . Heart disease Father   . Heart attack Father 29  . Hypertension Father   . Cancer Sister   . Hearing loss Mother   . Eczema Sister   . Hyperlipidemia Other   . Diabetes Other   . Hypertension Other   . Seizures Other   . Thyroid disease Other   . Colon cancer Neg Hx   . Stroke Neg Hx     ALLERGIES:  is allergic to ramipril.  MEDICATIONS:  Current Outpatient Medications  Medication Sig Dispense Refill  . acyclovir (ZOVIRAX) 400 MG tablet Take 400 mg by mouth 2 (two) times daily.    [name] Kitchen atorvastatin (LIPITOR) 40 MG tablet Take 40 mg by mouth at bedtime.     [name] Kitchen ezetimibe (ZETIA) 10 MG tablet Take 10 mg by mouth daily.     . famotidine (PEPCID) 20 MG tablet Take 2 tablets (40 mg total) by mouth daily. 60 tablet 1  . folic acid (FOLVITE) 1 MG tablet Take 1 mg by mouth daily.     . isosorbide mononitrate  (IMDUR) 30 MG 24 hr tablet Take 15 mg by mouth daily.     . ixazomib citrate (NINLARO) 3 MG capsule Take 1 capsule (49m) by mouth once weekly for 3 weeks on, 1 week off, repeat Q 4 weeks. Take on empty stomach 1hr before or 2hrs after food. 3 capsule 2  . losartan (COZAAR) 25 MG tablet Take 25 mg by mouth daily.    . Multiple Vitamin (MULTI-VITAMINS) TABS Take 1 tablet by mouth daily.    . mupirocin ointment (BACTROBAN) 2 % Apply 1 application topically as needed.    . nitroGLYCERIN (NITROSTAT) 0.4 MG SL tablet Place 0.4 mg under the tongue every 5 (five) minutes as needed for chest pain.    .[name] Kitchenomeprazole (PRILOSEC) 20 MG capsule Take 20 mg by mouth daily.      . ondansetron (ZOFRAN) 8 MG tablet Take 8 mg by mouth 2 (two) times daily as needed for nausea or vomiting.    . potassium chloride SA (K-DUR,KLOR-CON) 20 MEQ tablet Take 20 mEq by mouth at bedtime.     . prochlorperazine (COMPAZINE) 10 MG tablet Take 1 tablet (10 mg total) by mouth every 6 (six) hours as needed for nausea or vomiting. 30 tablet 2  . triamcinolone cream (KENALOG) 0.1 % Apply 1 application topically as needed for rash.     No current facility-administered medications for this visit.     REVIEW OF SYSTEMS:    A 10+ POINT REVIEW OF SYSTEMS WAS OBTAINED including neurology, dermatology, psychiatry, cardiac, respiratory, lymph, extremities, GI, GU, Musculoskeletal, constitutional, breasts, reproductive, HEENT.  All pertinent positives are noted in the HPI.  All others are negative.  PHYSICAL EXAMINATION: ECOG PERFORMANCE STATUS: 1 - Symptomatic but completely ambulatory  Vitals:   10/01/18 1158  BP: 115/73  Pulse: 74  Resp: 17  Temp: 98.7 F (37.1 C)  SpO2: 100%   Filed Weights   10/01/18 1158  Weight: 203 lb 4.8 oz (92.2 kg)   .Body mass index is 29.17 kg/m.  GENERAL:alert, in no acute distress and comfortable SKIN: no acute rashes, no significant lesions EYES: conjunctiva are pink and non-injected, sclera  anicteric OROPHARYNX: MMM, no exudates, no oropharyngeal erythema or ulceration NECK: supple, no JVD LYMPH:  no palpable lymphadenopathy in the cervical, axillary or inguinal regions LUNGS: clear to auscultation b/l with normal respiratory effort HEART: regular rate & rhythm ABDOMEN:  normoactive bowel sounds , non tender, not distended. No palpable hepatosplenomegaly.  Extremity: no pedal edema PSYCH: alert & oriented x 3 with fluent speech NEURO: no focal motor/sensory deficits   LABORATORY DATA:  I have reviewed the data as listed  . CBC Latest Ref Rng & Units 10/01/2018 07/30/2018 05/29/2018  WBC 4.0 - 10.5 K/uL 5.3 5.2 6.2  Hemoglobin 13.0 - 17.0 g/dL 13.2 13.1 13.1  Hematocrit 39.0 - 52.0 % 39.8 39.4 39.2  Platelets 150 - 400 K/uL 121(L) 108(L) 142(L)    CMP Latest Ref Rng & Units 10/01/2018 07/30/2018 05/29/2018  Glucose 70 - 99 mg/dL 107(H) 86 115(H)  BUN 8 - 23 mg/dL _0 Creatinine 0.61 - 1.24 mg/dL 1.43(H) 1.45(H) 1.69(H)  Sodium 135 - 145 mmol/L 141 140 142  Potassium 3.5 - 5.1 mmol/L 3.6 4.1 3.9  Chloride 98 - 111 mmol/L 107 104 107  CO2 22 - 32 mmol/L _1 Calcium 8.9 - 10.3 mg/dL 8.5(L) 8.8(L) 9.0  Total Protein 6.5 - 8.1 g/dL 6.8 6.8 6.9  Total Bilirubin 0.3 - 1.2 mg/dL 0.7 0.8 1.1  Alkaline Phos 38 - 126 U/L 70 71 72  AST 15 - 41 U/L _2 ALT 0 - 44 U/L _3 RADIOGRAPHIC STUDIES: I have personally reviewed the radiological images as listed and agreed with the findings in the report. No results found. Result status: Edited Result - FINAL                              *[employer] [employer].                        [place], [place] [address]                            [phone]  ------------------------------------------------------------------- Transthoracic Echocardiography  (Report amended )  Patient:    Abdias, Hickam MR #:       [id number]  Study Date: 08/24/2016 Gender:     M Age:        85 Height:     180.3 cm Weight:     90.6 kg BSA:        2.15 m^2 Pt. Status: Room:   ATTENDING    Darlina Guys, MD  [facility]  [name]  REFERRING    McAlhany, Christopher  PERFORMING   [facility], Outpatient  SONOGRAPHER  Roseanna Rainbow  cc:  ------------------------------------------------------------------- LV EF: 65% -   70%  ------------------------------------------------------------------- Indications:      Aortic stenosis 424.1.  ------------------------------------------------------------------- History:   PMH:  Amyloidosis.  Coronary artery disease.  Angina pectoris.  Risk factors:  Hypertension. Dyslipidemia.  ------------------------------------------------------------------- Study Conclusions  - Left ventricle: The cavity size was normal. Wall thickness was   increased in a pattern of mild LVH. Systolic function was   vigorous. The estimated ejection fraction was in the range of 65%   to 70%. Wall motion was normal; there were no regional wall   motion abnormalities. Doppler parameters are consistent with   abnormal left ventricular relaxation (grade 1 diastolic   dysfunction). - Aortic valve: Valve mobility was mildly restricted. - Aortic root: The aortic root was mildly dilated. - Mitral valve: Calcified annulus.  Impressions:  - Vigorous LV systolic function; mild diastolic dysfunction; mild   LVH; calcified aortic valve with no significant AS (peak velocity   2 m/s; mean gradient 9 mmHg); mildly dilated aortic root.    ASSESSMENT & PLAN:   78 y.o.  male with above-mentioned multiple medical comorbidities with  #1 Biopsy proven Renal and Systemic AL Amyloidosis Initial BM Bx shows 9% plasma cells and amyloid deposits. Echo shows normal systolic function with only grade 1 diastolic dysfunction. Skeletal survey with no lytic lesions suggestive of multiple myeloma. Serum K/L ratio  increased at about 6.22 on diagnosis and has now improved to 1.5 (wnl with normal kappa lambda serum free light chain ratio ) UPEP shows 24h protein improved from 15g to 5g daily 05/29/18 24hour UPEP no M spike   01/12/18 ECHO revealed LV EF of 60-65%   #2  Nephrotic Syndrome - likely related to AL amyloidosis.  Urine Study 07/26/17 showed Protein at 2.232g.  -07/19/17 the pt had a BM Bx exam which revealed Normocellular bone marrow (30-40%) with 2-3% plasma cells and focal amyloid deposition. UPEP 05/29/2018 - showed no M spike and total urinary protein of 167m/24h -- resolution of nephrotic syndrome.  PLAN: -Discussed pt labwork today, 10/01/18; blood counts are stable -10/01/18 MMP and SFLC ar stable. Last available MMP from 07/30/18 did not observe an M spike. Last light chains were normal. -Recommend beginning simethicone OTC and decreasing chewing gum frequency -The pt has no prohibitive toxicities from continuing 378mNinlaro on D1, D8, and D15 at this time. - Pt will take Dexamethasone 1075mZofran 4-8mg36mydroxyzine- all 30 minutes before the Ninlaro pill.  -Grade 1 neuropathy- minimal symptoms- some improvement, will continue to watch this, the pt does not prefer to take any medications for this at this time -Continue 400mg76mclovir BID -Continue Vitamin B complex  -Will continue repeat 24 hour urine study and repeat ECHO every 6 months -Recommended that the pt receive his annual flu vaccine, and continue follow up with his transplant team for his vaccinations  -Use anti-histamines as needed -Will see the pt back in 2 months   #4 Patient Active Problem List   Diagnosis Date Noted  . Left lower lobe pneumonia (HCC) [place]29/2019  . Sepsis due to pneumonia (HCC) [place]29/2019  . Folliculitis 10/301/49/1791L amyloidosis (HCC) [place]29/2018  . Umbilical hernia 10/1848/56/9794IZZINESS 02/19/2010  . CAD, NATIVE VESSEL 07/21/2009  . CHEST PAIN-UNSPECIFIED 07/21/2009  . HYPERLIPIDEMIA 04/30/2007  .  Essential hypertension 04/30/2007  . GASTROESOPHAGEAL REFLUX DISEASE 04/30/2007  .  SEIZURE DISORDER 04/30/2007  . COMPRESSION FRACTURE, L1 VERTEBRA 04/30/2007  . DIVERTICULOSIS, COLON, HX OF 04/30/2007  . Other postprocedural status(V45.89) 04/30/2007  -Continue follow-up with primary care physician for management of other medical co-morbidities. -Recommend if pt experiences spike fever to report to hospital with transplant team    RTC with Dr [name] with labs in 2 months    The total time spent in the appt was 25 minutes and more than 50% was on counseling and direct patient cares.     Sullivan Lone MD [place] AAHIVMS [facility] Hematology/Oncology Physician [facility]  (Office):       [phone] (Work cell):  [phone] (Fax):           [phone]  I, [place], am acting as a scribe for Dr. Sullivan Lone.   .I have reviewed the above documentation for accuracy and completeness, and I agree with the above. Brunetta Genera MD

## 2018-10-01 ENCOUNTER — Other Ambulatory Visit: Payer: Self-pay

## 2018-10-01 ENCOUNTER — Inpatient Hospital Stay: Payer: Medicare HMO | Attending: Hematology

## 2018-10-01 ENCOUNTER — Inpatient Hospital Stay (HOSPITAL_BASED_OUTPATIENT_CLINIC_OR_DEPARTMENT_OTHER): Payer: Medicare HMO | Admitting: Hematology

## 2018-10-01 VITALS — BP 115/73 | HR 74 | Temp 98.7°F | Resp 17 | Ht 70.0 in | Wt 203.3 lb

## 2018-10-01 DIAGNOSIS — I10 Essential (primary) hypertension: Secondary | ICD-10-CM | POA: Diagnosis not present

## 2018-10-01 DIAGNOSIS — Z87891 Personal history of nicotine dependence: Secondary | ICD-10-CM | POA: Insufficient documentation

## 2018-10-01 DIAGNOSIS — D696 Thrombocytopenia, unspecified: Secondary | ICD-10-CM

## 2018-10-01 DIAGNOSIS — Z79899 Other long term (current) drug therapy: Secondary | ICD-10-CM

## 2018-10-01 DIAGNOSIS — E8581 Light chain (AL) amyloidosis: Secondary | ICD-10-CM | POA: Diagnosis not present

## 2018-10-01 DIAGNOSIS — E785 Hyperlipidemia, unspecified: Secondary | ICD-10-CM

## 2018-10-01 DIAGNOSIS — G40909 Epilepsy, unspecified, not intractable, without status epilepticus: Secondary | ICD-10-CM | POA: Insufficient documentation

## 2018-10-01 DIAGNOSIS — I2510 Atherosclerotic heart disease of native coronary artery without angina pectoris: Secondary | ICD-10-CM

## 2018-10-01 LAB — CBC WITH DIFFERENTIAL/PLATELET
Abs Immature Granulocytes: 0.02 10*3/uL (ref 0.00–0.07)
Basophils Absolute: 0 10*3/uL (ref 0.0–0.1)
Basophils Relative: 0 %
Eosinophils Absolute: 0.1 10*3/uL (ref 0.0–0.5)
Eosinophils Relative: 2 %
HCT: 39.8 % (ref 39.0–52.0)
Hemoglobin: 13.2 g/dL (ref 13.0–17.0)
Immature Granulocytes: 0 %
Lymphocytes Relative: 23 %
Lymphs Abs: 1.2 10*3/uL (ref 0.7–4.0)
MCH: 30.8 pg (ref 26.0–34.0)
MCHC: 33.2 g/dL (ref 30.0–36.0)
MCV: 93 fL (ref 80.0–100.0)
Monocytes Absolute: 0.5 10*3/uL (ref 0.1–1.0)
Monocytes Relative: 9 %
Neutro Abs: 3.5 10*3/uL (ref 1.7–7.7)
Neutrophils Relative %: 66 %
Platelets: 121 10*3/uL — ABNORMAL LOW (ref 150–400)
RBC: 4.28 MIL/uL (ref 4.22–5.81)
RDW: 14.6 % (ref 11.5–15.5)
WBC: 5.3 10*3/uL (ref 4.0–10.5)
nRBC: 0 % (ref 0.0–0.2)

## 2018-10-01 LAB — CMP (CANCER CENTER ONLY)
ALT: 16 U/L (ref 0–44)
AST: 31 U/L (ref 15–41)
Albumin: 3.6 g/dL (ref 3.5–5.0)
Alkaline Phosphatase: 70 U/L (ref 38–126)
Anion gap: 10 (ref 5–15)
BUN: 20 mg/dL (ref 8–23)
CO2: 24 mmol/L (ref 22–32)
Calcium: 8.5 mg/dL — ABNORMAL LOW (ref 8.9–10.3)
Chloride: 107 mmol/L (ref 98–111)
Creatinine: 1.43 mg/dL — ABNORMAL HIGH (ref 0.61–1.24)
GFR, Est AFR Am: 55 mL/min — ABNORMAL LOW (ref >60)
GFR, Estimated: 48 mL/min — ABNORMAL LOW (ref >60)
Glucose, Bld: 107 mg/dL — ABNORMAL HIGH (ref 70–99)
Potassium: 3.6 mmol/L (ref 3.5–5.1)
Sodium: 141 mmol/L (ref 135–145)
Total Bilirubin: 0.7 mg/dL (ref 0.3–1.2)
Total Protein: 6.8 g/dL (ref 6.5–8.1)

## 2018-10-02 ENCOUNTER — Telehealth: Payer: Self-pay | Admitting: Hematology

## 2018-10-02 LAB — MULTIPLE MYELOMA PANEL, SERUM
Albumin SerPl Elph-Mcnc: 3.4 g/dL (ref 2.9–4.4)
Albumin/Glob SerPl: 1.2 (ref 0.7–1.7)
Alpha 1: 0.2 g/dL (ref 0.0–0.4)
Alpha2 Glob SerPl Elph-Mcnc: 0.9 g/dL (ref 0.4–1.0)
B-Globulin SerPl Elph-Mcnc: 1 g/dL (ref 0.7–1.3)
Gamma Glob SerPl Elph-Mcnc: 0.8 g/dL (ref 0.4–1.8)
Globulin, Total: 3 g/dL (ref 2.2–3.9)
IgA: 99 mg/dL (ref 61–437)
IgG (Immunoglobin G), Serum: 880 mg/dL (ref 603–1613)
IgM (Immunoglobulin M), Srm: 43 mg/dL (ref 15–143)
Total Protein ELP: 6.4 g/dL (ref 6.0–8.5)

## 2018-10-02 LAB — KAPPA/LAMBDA LIGHT CHAINS
Kappa free light chain: 26.5 mg/L — ABNORMAL HIGH (ref 3.3–19.4)
Kappa, lambda light chain ratio: 1.56 (ref 0.26–1.65)
Lambda free light chains: 17 mg/L (ref 5.7–26.3)

## 2018-10-02 NOTE — Telephone Encounter (Signed)
Scheduled appt per 6/8 los. A calendar will be mailed out. °

## 2018-10-05 DIAGNOSIS — R972 Elevated prostate specific antigen [PSA]: Secondary | ICD-10-CM | POA: Diagnosis not present

## 2018-10-24 DIAGNOSIS — I129 Hypertensive chronic kidney disease with stage 1 through stage 4 chronic kidney disease, or unspecified chronic kidney disease: Secondary | ICD-10-CM | POA: Diagnosis not present

## 2018-10-24 DIAGNOSIS — Z86711 Personal history of pulmonary embolism: Secondary | ICD-10-CM

## 2018-10-24 DIAGNOSIS — R509 Fever, unspecified: Secondary | ICD-10-CM | POA: Diagnosis not present

## 2018-10-24 DIAGNOSIS — I2510 Atherosclerotic heart disease of native coronary artery without angina pectoris: Secondary | ICD-10-CM | POA: Diagnosis not present

## 2018-10-24 DIAGNOSIS — N183 Chronic kidney disease, stage 3 (moderate): Secondary | ICD-10-CM | POA: Diagnosis not present

## 2018-10-24 DIAGNOSIS — K219 Gastro-esophageal reflux disease without esophagitis: Secondary | ICD-10-CM | POA: Diagnosis not present

## 2018-10-24 DIAGNOSIS — R1084 Generalized abdominal pain: Secondary | ICD-10-CM | POA: Diagnosis not present

## 2018-10-24 DIAGNOSIS — D696 Thrombocytopenia, unspecified: Secondary | ICD-10-CM | POA: Diagnosis not present

## 2018-10-24 DIAGNOSIS — J189 Pneumonia, unspecified organism: Secondary | ICD-10-CM | POA: Diagnosis not present

## 2018-10-24 DIAGNOSIS — R0602 Shortness of breath: Secondary | ICD-10-CM | POA: Diagnosis not present

## 2018-10-24 HISTORY — DX: Personal history of pulmonary embolism: Z86.711

## 2018-10-25 ENCOUNTER — Telehealth: Payer: Self-pay | Admitting: *Deleted

## 2018-10-25 NOTE — Telephone Encounter (Signed)
Contacted by Anderson Malta from [facility]. She stated patient's platelets were decreased to 66 and that Dr. [name] wanted Dr. [name] informed. She is faxing  labs and OV note from patient's appt 7/2. She also stated patient had contacted their office this morning to inform them he had received notification from the [place] that he tested positive for Covid 19. Fax received. Dr. [name] notified of patient's information regarding positive Covid 19 test. Dr. [name] asked that patient be contacted with following directions: Hold Ninlaro starting now until appt in August. Patient should follow His PCP's directions for care related to Covid 19. If patient experiences extreme shortness of breath, elevated temperature or chest pain, he should seek emergency medical care immediately. Patient contacted with these directions and verbalized understanding.

## 2018-10-25 NOTE — Telephone Encounter (Signed)
Patient called office. PCP checked Platelets, now 66, and advised patient to inform Dr. [name].  He was informed of the results and asked that patient be told that it is not unexpected. Dr. [name] will assess patient with labs at next appt 8/10. In the interim, patient should call with any new bruising or bleeding. Contacted patient as requested by Dr. [name] with his directions. Patient verbalized understanding.

## 2018-10-28 ENCOUNTER — Encounter (HOSPITAL_COMMUNITY): Payer: Self-pay

## 2018-10-28 ENCOUNTER — Emergency Department (HOSPITAL_COMMUNITY): Payer: Non-veteran care

## 2018-10-28 ENCOUNTER — Other Ambulatory Visit: Payer: Self-pay

## 2018-10-28 ENCOUNTER — Inpatient Hospital Stay (HOSPITAL_COMMUNITY)
Admission: EM | Admit: 2018-10-28 | Discharge: 2018-11-17 | DRG: 177 | Disposition: A | Discharge status: Home Health | Payer: Non-veteran care | Attending: Internal Medicine | Admitting: Internal Medicine

## 2018-10-28 DIAGNOSIS — J129 Viral pneumonia, unspecified: Secondary | ICD-10-CM | POA: Diagnosis not present

## 2018-10-28 DIAGNOSIS — J1282 Pneumonia due to coronavirus disease 2019: Secondary | ICD-10-CM | POA: Diagnosis present

## 2018-10-28 DIAGNOSIS — R071 Chest pain on breathing: Secondary | ICD-10-CM | POA: Diagnosis not present

## 2018-10-28 DIAGNOSIS — E8581 Light chain (AL) amyloidosis: Secondary | ICD-10-CM | POA: Diagnosis present

## 2018-10-28 DIAGNOSIS — R0902 Hypoxemia: Secondary | ICD-10-CM

## 2018-10-28 DIAGNOSIS — J181 Lobar pneumonia, unspecified organism: Secondary | ICD-10-CM | POA: Diagnosis not present

## 2018-10-28 DIAGNOSIS — R739 Hyperglycemia, unspecified: Secondary | ICD-10-CM | POA: Diagnosis not present

## 2018-10-28 DIAGNOSIS — I2510 Atherosclerotic heart disease of native coronary artery without angina pectoris: Secondary | ICD-10-CM | POA: Diagnosis present

## 2018-10-28 DIAGNOSIS — I493 Ventricular premature depolarization: Secondary | ICD-10-CM | POA: Diagnosis not present

## 2018-10-28 DIAGNOSIS — Z8249 Family history of ischemic heart disease and other diseases of the circulatory system: Secondary | ICD-10-CM

## 2018-10-28 DIAGNOSIS — J982 Interstitial emphysema: Secondary | ICD-10-CM | POA: Diagnosis not present

## 2018-10-28 DIAGNOSIS — I452 Bifascicular block: Secondary | ICD-10-CM | POA: Diagnosis present

## 2018-10-28 DIAGNOSIS — K219 Gastro-esophageal reflux disease without esophagitis: Secondary | ICD-10-CM | POA: Diagnosis present

## 2018-10-28 DIAGNOSIS — R059 Cough, unspecified: Secondary | ICD-10-CM

## 2018-10-28 DIAGNOSIS — H9190 Unspecified hearing loss, unspecified ear: Secondary | ICD-10-CM | POA: Diagnosis present

## 2018-10-28 DIAGNOSIS — E785 Hyperlipidemia, unspecified: Secondary | ICD-10-CM | POA: Diagnosis present

## 2018-10-28 DIAGNOSIS — I129 Hypertensive chronic kidney disease with stage 1 through stage 4 chronic kidney disease, or unspecified chronic kidney disease: Secondary | ICD-10-CM | POA: Diagnosis present

## 2018-10-28 DIAGNOSIS — T375X5A Adverse effect of antiviral drugs, initial encounter: Secondary | ICD-10-CM | POA: Diagnosis present

## 2018-10-28 DIAGNOSIS — Z9221 Personal history of antineoplastic chemotherapy: Secondary | ICD-10-CM | POA: Diagnosis not present

## 2018-10-28 DIAGNOSIS — N183 Chronic kidney disease, stage 3 (moderate): Secondary | ICD-10-CM | POA: Diagnosis present

## 2018-10-28 DIAGNOSIS — J9601 Acute respiratory failure with hypoxia: Secondary | ICD-10-CM | POA: Diagnosis present

## 2018-10-28 DIAGNOSIS — Z888 Allergy status to other drugs, medicaments and biological substances status: Secondary | ICD-10-CM

## 2018-10-28 DIAGNOSIS — K5900 Constipation, unspecified: Secondary | ICD-10-CM | POA: Diagnosis not present

## 2018-10-28 DIAGNOSIS — D696 Thrombocytopenia, unspecified: Secondary | ICD-10-CM | POA: Diagnosis not present

## 2018-10-28 DIAGNOSIS — R918 Other nonspecific abnormal finding of lung field: Secondary | ICD-10-CM | POA: Diagnosis not present

## 2018-10-28 DIAGNOSIS — E873 Alkalosis: Secondary | ICD-10-CM | POA: Diagnosis not present

## 2018-10-28 DIAGNOSIS — Z9484 Stem cells transplant status: Secondary | ICD-10-CM | POA: Diagnosis not present

## 2018-10-28 DIAGNOSIS — D6959 Other secondary thrombocytopenia: Secondary | ICD-10-CM | POA: Diagnosis present

## 2018-10-28 DIAGNOSIS — I472 Ventricular tachycardia: Secondary | ICD-10-CM | POA: Diagnosis not present

## 2018-10-28 DIAGNOSIS — U071 COVID-19: Secondary | ICD-10-CM | POA: Diagnosis not present

## 2018-10-28 DIAGNOSIS — I2693 Single subsegmental pulmonary embolism without acute cor pulmonale: Secondary | ICD-10-CM | POA: Diagnosis not present

## 2018-10-28 DIAGNOSIS — Z8619 Personal history of other infectious and parasitic diseases: Secondary | ICD-10-CM | POA: Diagnosis not present

## 2018-10-28 DIAGNOSIS — R05 Cough: Secondary | ICD-10-CM | POA: Diagnosis not present

## 2018-10-28 DIAGNOSIS — Z8614 Personal history of Methicillin resistant Staphylococcus aureus infection: Secondary | ICD-10-CM | POA: Diagnosis not present

## 2018-10-28 DIAGNOSIS — R0602 Shortness of breath: Secondary | ICD-10-CM

## 2018-10-28 DIAGNOSIS — I2699 Other pulmonary embolism without acute cor pulmonale: Secondary | ICD-10-CM | POA: Diagnosis not present

## 2018-10-28 DIAGNOSIS — J988 Other specified respiratory disorders: Secondary | ICD-10-CM | POA: Diagnosis not present

## 2018-10-28 DIAGNOSIS — I10 Essential (primary) hypertension: Secondary | ICD-10-CM | POA: Diagnosis not present

## 2018-10-28 DIAGNOSIS — J1289 Other viral pneumonia: Secondary | ICD-10-CM | POA: Diagnosis present

## 2018-10-28 DIAGNOSIS — J9690 Respiratory failure, unspecified, unspecified whether with hypoxia or hypercapnia: Secondary | ICD-10-CM | POA: Diagnosis not present

## 2018-10-28 DIAGNOSIS — Z87891 Personal history of nicotine dependence: Secondary | ICD-10-CM

## 2018-10-28 DIAGNOSIS — R0600 Dyspnea, unspecified: Secondary | ICD-10-CM | POA: Diagnosis not present

## 2018-10-28 DIAGNOSIS — Z79899 Other long term (current) drug therapy: Secondary | ICD-10-CM

## 2018-10-28 DIAGNOSIS — Z7982 Long term (current) use of aspirin: Secondary | ICD-10-CM

## 2018-10-28 LAB — BASIC METABOLIC PANEL
Anion gap: 12 (ref 5–15)
BUN: 27 mg/dL — ABNORMAL HIGH (ref 8–23)
CO2: 22 mmol/L (ref 22–32)
Calcium: 8 mg/dL — ABNORMAL LOW (ref 8.9–10.3)
Chloride: 105 mmol/L (ref 98–111)
Creatinine, Ser: 1.39 mg/dL — ABNORMAL HIGH (ref 0.61–1.24)
GFR calc Af Amer: 57 mL/min — ABNORMAL LOW (ref >60)
GFR calc non Af Amer: 49 mL/min — ABNORMAL LOW (ref >60)
Glucose, Bld: 117 mg/dL — ABNORMAL HIGH (ref 70–99)
Potassium: 3.9 mmol/L (ref 3.5–5.1)
Sodium: 139 mmol/L (ref 135–145)

## 2018-10-28 LAB — CBC
HCT: 41.5 % (ref 39.0–52.0)
Hemoglobin: 14.1 g/dL (ref 13.0–17.0)
MCH: 31.5 pg (ref 26.0–34.0)
MCHC: 34 g/dL (ref 30.0–36.0)
MCV: 92.6 fL (ref 80.0–100.0)
Platelets: 83 10*3/uL — ABNORMAL LOW (ref 150–400)
RBC: 4.48 MIL/uL (ref 4.22–5.81)
RDW: 15.1 % (ref 11.5–15.5)
WBC: 12.8 10*3/uL — ABNORMAL HIGH (ref 4.0–10.5)
nRBC: 0 % (ref 0.0–0.2)

## 2018-10-28 LAB — TROPONIN I (HIGH SENSITIVITY): Troponin I (High Sensitivity): 12 ng/L (ref <18)

## 2018-10-28 MED ORDER — SODIUM CHLORIDE 0.9% FLUSH
3.0000 mL | Freq: Once | INTRAVENOUS | Status: AC
  Administered 2018-10-29: 3 mL via INTRAVENOUS

## 2018-10-28 NOTE — ED Notes (Signed)
Patient transported to X-ray 

## 2018-10-28 NOTE — ED Triage Notes (Signed)
Pt reports having chest pain upon breathing for 2x weeks. Pt took albuterol at home.  denies N/V/Lightheadedness. Pt also has low grade fever 99.5.

## 2018-10-29 ENCOUNTER — Inpatient Hospital Stay (HOSPITAL_COMMUNITY): Payer: Non-veteran care

## 2018-10-29 DIAGNOSIS — J988 Other specified respiratory disorders: Secondary | ICD-10-CM | POA: Diagnosis not present

## 2018-10-29 DIAGNOSIS — I472 Ventricular tachycardia: Secondary | ICD-10-CM | POA: Diagnosis not present

## 2018-10-29 DIAGNOSIS — J9690 Respiratory failure, unspecified, unspecified whether with hypoxia or hypercapnia: Secondary | ICD-10-CM | POA: Diagnosis not present

## 2018-10-29 DIAGNOSIS — D696 Thrombocytopenia, unspecified: Secondary | ICD-10-CM | POA: Diagnosis not present

## 2018-10-29 DIAGNOSIS — E785 Hyperlipidemia, unspecified: Secondary | ICD-10-CM | POA: Diagnosis present

## 2018-10-29 DIAGNOSIS — R0902 Hypoxemia: Secondary | ICD-10-CM | POA: Diagnosis not present

## 2018-10-29 DIAGNOSIS — J9601 Acute respiratory failure with hypoxia: Secondary | ICD-10-CM

## 2018-10-29 DIAGNOSIS — N183 Chronic kidney disease, stage 3 (moderate): Secondary | ICD-10-CM | POA: Diagnosis present

## 2018-10-29 DIAGNOSIS — Z8614 Personal history of Methicillin resistant Staphylococcus aureus infection: Secondary | ICD-10-CM | POA: Diagnosis not present

## 2018-10-29 DIAGNOSIS — J129 Viral pneumonia, unspecified: Secondary | ICD-10-CM | POA: Diagnosis not present

## 2018-10-29 DIAGNOSIS — I2699 Other pulmonary embolism without acute cor pulmonale: Secondary | ICD-10-CM | POA: Diagnosis not present

## 2018-10-29 DIAGNOSIS — E8581 Light chain (AL) amyloidosis: Secondary | ICD-10-CM | POA: Diagnosis not present

## 2018-10-29 DIAGNOSIS — J982 Interstitial emphysema: Secondary | ICD-10-CM | POA: Diagnosis not present

## 2018-10-29 DIAGNOSIS — Z8619 Personal history of other infectious and parasitic diseases: Secondary | ICD-10-CM | POA: Diagnosis not present

## 2018-10-29 DIAGNOSIS — K219 Gastro-esophageal reflux disease without esophagitis: Secondary | ICD-10-CM | POA: Diagnosis present

## 2018-10-29 DIAGNOSIS — I493 Ventricular premature depolarization: Secondary | ICD-10-CM | POA: Diagnosis not present

## 2018-10-29 DIAGNOSIS — J1282 Pneumonia due to coronavirus disease 2019: Secondary | ICD-10-CM | POA: Diagnosis present

## 2018-10-29 DIAGNOSIS — I10 Essential (primary) hypertension: Secondary | ICD-10-CM | POA: Diagnosis not present

## 2018-10-29 DIAGNOSIS — R0600 Dyspnea, unspecified: Secondary | ICD-10-CM | POA: Diagnosis not present

## 2018-10-29 DIAGNOSIS — U071 COVID-19: Principal | ICD-10-CM

## 2018-10-29 DIAGNOSIS — I452 Bifascicular block: Secondary | ICD-10-CM | POA: Diagnosis not present

## 2018-10-29 DIAGNOSIS — Z9484 Stem cells transplant status: Secondary | ICD-10-CM | POA: Diagnosis not present

## 2018-10-29 DIAGNOSIS — T375X5A Adverse effect of antiviral drugs, initial encounter: Secondary | ICD-10-CM | POA: Diagnosis present

## 2018-10-29 DIAGNOSIS — J1289 Other viral pneumonia: Secondary | ICD-10-CM

## 2018-10-29 DIAGNOSIS — K5900 Constipation, unspecified: Secondary | ICD-10-CM | POA: Diagnosis not present

## 2018-10-29 DIAGNOSIS — D6959 Other secondary thrombocytopenia: Secondary | ICD-10-CM | POA: Diagnosis present

## 2018-10-29 DIAGNOSIS — Z9221 Personal history of antineoplastic chemotherapy: Secondary | ICD-10-CM | POA: Diagnosis not present

## 2018-10-29 DIAGNOSIS — R05 Cough: Secondary | ICD-10-CM | POA: Diagnosis not present

## 2018-10-29 DIAGNOSIS — I2693 Single subsegmental pulmonary embolism without acute cor pulmonale: Secondary | ICD-10-CM | POA: Diagnosis not present

## 2018-10-29 DIAGNOSIS — R918 Other nonspecific abnormal finding of lung field: Secondary | ICD-10-CM | POA: Diagnosis not present

## 2018-10-29 DIAGNOSIS — H9190 Unspecified hearing loss, unspecified ear: Secondary | ICD-10-CM | POA: Diagnosis present

## 2018-10-29 DIAGNOSIS — I2510 Atherosclerotic heart disease of native coronary artery without angina pectoris: Secondary | ICD-10-CM | POA: Diagnosis present

## 2018-10-29 DIAGNOSIS — R0602 Shortness of breath: Secondary | ICD-10-CM | POA: Diagnosis not present

## 2018-10-29 DIAGNOSIS — I129 Hypertensive chronic kidney disease with stage 1 through stage 4 chronic kidney disease, or unspecified chronic kidney disease: Secondary | ICD-10-CM | POA: Diagnosis present

## 2018-10-29 DIAGNOSIS — J181 Lobar pneumonia, unspecified organism: Secondary | ICD-10-CM | POA: Diagnosis not present

## 2018-10-29 DIAGNOSIS — E873 Alkalosis: Secondary | ICD-10-CM | POA: Diagnosis not present

## 2018-10-29 DIAGNOSIS — R739 Hyperglycemia, unspecified: Secondary | ICD-10-CM | POA: Diagnosis not present

## 2018-10-29 LAB — HEPATIC FUNCTION PANEL
ALT: 60 U/L — ABNORMAL HIGH (ref 0–44)
ALT: 57 U/L — ABNORMAL HIGH (ref 0–44)
AST: 77 U/L — ABNORMAL HIGH (ref 15–41)
AST: 71 U/L — ABNORMAL HIGH (ref 15–41)
Albumin: 3.2 g/dL — ABNORMAL LOW (ref 3.5–5.0)
Albumin: 2.9 g/dL — ABNORMAL LOW (ref 3.5–5.0)
Alkaline Phosphatase: 78 U/L (ref 38–126)
Alkaline Phosphatase: 65 U/L (ref 38–126)
Bilirubin, Direct: 0.3 mg/dL — ABNORMAL HIGH (ref 0.0–0.2)
Bilirubin, Direct: 0.3 mg/dL — ABNORMAL HIGH (ref 0.0–0.2)
Indirect Bilirubin: 0.6 mg/dL (ref 0.3–0.9)
Indirect Bilirubin: 0.8 mg/dL (ref 0.3–0.9)
Total Bilirubin: 0.9 mg/dL (ref 0.3–1.2)
Total Bilirubin: 1.1 mg/dL (ref 0.3–1.2)
Total Protein: 6.1 g/dL — ABNORMAL LOW (ref 6.5–8.1)
Total Protein: 6.3 g/dL — ABNORMAL LOW (ref 6.5–8.1)

## 2018-10-29 LAB — CBC
HCT: 39.6 % (ref 39.0–52.0)
Hemoglobin: 13.5 g/dL (ref 13.0–17.0)
MCH: 31.1 pg (ref 26.0–34.0)
MCHC: 34.1 g/dL (ref 30.0–36.0)
MCV: 91.2 fL (ref 80.0–100.0)
Platelets: 71 10*3/uL — ABNORMAL LOW (ref 150–400)
RBC: 4.34 MIL/uL (ref 4.22–5.81)
RDW: 15 % (ref 11.5–15.5)
WBC: 11.4 10*3/uL — ABNORMAL HIGH (ref 4.0–10.5)
nRBC: 0 % (ref 0.0–0.2)

## 2018-10-29 LAB — BASIC METABOLIC PANEL
Anion gap: 10 (ref 5–15)
BUN: 26 mg/dL — ABNORMAL HIGH (ref 8–23)
CO2: 24 mmol/L (ref 22–32)
Calcium: 8 mg/dL — ABNORMAL LOW (ref 8.9–10.3)
Chloride: 103 mmol/L (ref 98–111)
Creatinine, Ser: 1.21 mg/dL (ref 0.61–1.24)
GFR calc Af Amer: >60 mL/min (ref >60)
GFR calc non Af Amer: 58 mL/min — ABNORMAL LOW (ref >60)
Glucose, Bld: 136 mg/dL — ABNORMAL HIGH (ref 70–99)
Potassium: 4.2 mmol/L (ref 3.5–5.1)
Sodium: 137 mmol/L (ref 135–145)

## 2018-10-29 LAB — D-DIMER, QUANTITATIVE
D-Dimer, Quant: 1.06 ug/mL-FEU — ABNORMAL HIGH (ref 0.00–0.50)
D-Dimer, Quant: 1.09 ug/mL-FEU — ABNORMAL HIGH (ref 0.00–0.50)

## 2018-10-29 LAB — FIBRINOGEN: Fibrinogen: 628 mg/dL — ABNORMAL HIGH (ref 210–475)

## 2018-10-29 LAB — LACTIC ACID, PLASMA
Lactic Acid, Venous: 1.5 mmol/L (ref 0.5–1.9)
Lactic Acid, Venous: 1.9 mmol/L (ref 0.5–1.9)

## 2018-10-29 LAB — FERRITIN
Ferritin: 123 ng/mL (ref 24–336)
Ferritin: 129 ng/mL (ref 24–336)

## 2018-10-29 LAB — TROPONIN I (HIGH SENSITIVITY): Troponin I (High Sensitivity): 14 ng/L (ref <18)

## 2018-10-29 LAB — PROCALCITONIN: Procalcitonin: <0.1 ng/mL

## 2018-10-29 LAB — C-REACTIVE PROTEIN
CRP: 3.7 mg/dL — ABNORMAL HIGH
CRP: 7.4 mg/dL — ABNORMAL HIGH (ref <1.0)

## 2018-10-29 LAB — LACTATE DEHYDROGENASE
LDH: 365 U/L — ABNORMAL HIGH (ref 98–192)
LDH: 301 U/L — ABNORMAL HIGH (ref 98–192)

## 2018-10-29 LAB — SARS CORONAVIRUS 2 BY RT PCR (HOSPITAL ORDER, PERFORMED IN ~~LOC~~ HOSPITAL LAB): SARS Coronavirus 2: POSITIVE — AB

## 2018-10-29 LAB — TRIGLYCERIDES: Triglycerides: 103 mg/dL (ref <150)

## 2018-10-29 MED ORDER — SODIUM CHLORIDE 0.9 % IV SOLN
200.0000 mg | Freq: Once | INTRAVENOUS | Status: AC
  Administered 2018-10-29: 200 mg via INTRAVENOUS
  Filled 2018-10-29: qty 40

## 2018-10-29 MED ORDER — ACETAMINOPHEN 325 MG PO TABS
650.0000 mg | ORAL_TABLET | Freq: Four times a day (QID) | ORAL | Status: DC | PRN
  Administered 2018-10-29 – 2018-11-08 (×3): 650 mg via ORAL
  Filled 2018-10-29 (×3): qty 2

## 2018-10-29 MED ORDER — METHYLPREDNISOLONE SODIUM SUCC 125 MG IJ SOLR
50.0000 mg | Freq: Two times a day (BID) | INTRAMUSCULAR | Status: DC
  Administered 2018-10-29 – 2018-10-30 (×3): 50 mg via INTRAVENOUS
  Filled 2018-10-29 (×3): qty 2

## 2018-10-29 MED ORDER — HYDRALAZINE HCL 20 MG/ML IJ SOLN: 5.0000 mg | INTRAMUSCULAR | Status: DC | PRN

## 2018-10-29 MED ORDER — ACETAMINOPHEN 650 MG RE SUPP: 650.0000 mg | Freq: Four times a day (QID) | RECTAL | Status: DC | PRN

## 2018-10-29 MED ORDER — SODIUM CHLORIDE 0.9 % IV SOLN
100.0000 mg | INTRAVENOUS | Status: AC
  Administered 2018-10-30 – 2018-11-02 (×4): 100 mg via INTRAVENOUS
  Filled 2018-10-29 (×4): qty 20

## 2018-10-29 MED ORDER — HEPARIN SODIUM (PORCINE) 5000 UNIT/ML IJ SOLN
5000.0000 [IU] | Freq: Three times a day (TID) | INTRAMUSCULAR | Status: DC
  Filled 2018-10-29: qty 1

## 2018-10-29 MED ORDER — IOHEXOL 350 MG/ML SOLN
75.0000 mL | Freq: Once | INTRAVENOUS | Status: AC | PRN
  Administered 2018-10-29: 04:00:00 75 mL via INTRAVENOUS

## 2018-10-29 NOTE — ED Notes (Signed)
ED TO INPATIENT HANDOFF REPORT  ED Nurse Name and Phone #: [phone]  S Name/Age/Gender Alex [name]. 78 y.o. male Room/Bed: 024C/024C  Code Status   Code Status: Prior  Home/SNF/Other Home Patient oriented to: self, place, time and situation Is this baseline? Yes   Triage Complete: Triage complete  Chief Complaint Chest Pain, Fever, Cough  Triage Note Pt reports having chest pain upon breathing for 2x weeks. Pt took albuterol at home.  denies N/V/Lightheadedness. Pt also has low grade fever 99.5.   Allergies Allergies  Allergen Reactions  . Ramipril Cough    Level of Care/Admitting Diagnosis ED Disposition    ED Disposition Condition [facility] Area: [place] [[id number]]  Level of Care: Progressive [102]  Covid Evaluation: Confirmed COVID Positive  Diagnosis: Pneumonia due to COVID-19 virus [[id number]]  Admitting Physician: Shela Leff [[id number]]  Attending Physician: Shela Leff [[id number]]  Estimated length of stay: past midnight tomorrow  Certification:: I certify this patient will need inpatient services for at least 2 midnights  PT Class (Do Not Modify): Inpatient [101]  PT Acc Code (Do Not Modify): Private [1]       B Medical/Surgery History Past Medical History:  Diagnosis Date  . Amyloidosis ([place])   . Arthritis   . Basal cell carcinoma   . CAD (coronary artery disease)    Lexiscan Myoview 6/14: Normal study, no scar or ischemia, EF 62%  . Cataract   . Chronic kidney disease   . Colon polyp   . Coronary artery disease    moderate disease by cath 2011  . Diverticulosis of colon   . GERD (gastroesophageal reflux disease)   . Hearing loss   . Heart disease   . Hiatal hernia   . Hyperlipidemia   . Hypertension   . MRSA (methicillin resistant staph aureus) culture positive   . Seizure disorder ([place])   . Seizures ([place])    last in 1970s   Past Surgical History:  Procedure Laterality Date   . BUNIONECTOMY    . CARPAL TUNNEL RELEASE  1994   left  . CERVICAL FUSION    . compression fracture    . FOOT SURGERY     right -twice, left foot once  . HERNIA REPAIR     Umbilical with PVP  . KNEE ARTHROSCOPY     left knee  . [place]   left - scope  . NASAL SEPTUM SURGERY    . NECK SURGERY    . NOSE SURGERY     twice  . PAROTIDECTOMY Right 03/09/2017  . ROTATOR CUFF REPAIR     twice, both shoulders  . ROTATOR CUFF REPAIR    . SHOULDER SURGERY    . SKIN BIOPSY    . SPINE SURGERY     C2, C3, C4  . TRIGGER FINGER RELEASE     both hands, twice  . TRIGGER FINGER RELEASE    . VASECTOMY       A IV Location/Drains/Wounds Patient Lines/Drains/Airways Status   Active Line/Drains/Airways    Name:   Placement date:   Placement time:   Site:   Days:   Peripheral IV 10/29/18 Right Hand   10/29/18    0050    Hand   less than 1          Intake/Output Last 24 hours No intake or output data in the 24 hours ending 10/29/18 0146  Labs/Imaging Results for orders  placed or performed during the hospital encounter of 10/28/18 (from the past 48 hour(s))  Lactic acid, plasma     Status: None   Collection Time: 10/28/18 10:52 PM  Result Value Ref Range   Lactic Acid, Venous 1.9 0.5 - 1.9 mmol/L    Comment: Performed at [facility], [address], [place], [place] [address]  Basic metabolic panel     Status: Abnormal   Collection Time: 10/28/18 10:52 PM  Result Value Ref Range   Sodium 139 135 - 145 mmol/L   Potassium 3.9 3.5 - 5.1 mmol/L   Chloride 105 98 - 111 mmol/L   CO2 22 22 - 32 mmol/L   Glucose, Bld 117 (H) 70 - 99 mg/dL   BUN 27 (H) 8 - 23 mg/dL   Creatinine, Ser 1.39 (H) 0.61 - 1.24 mg/dL   Calcium 8.0 (L) 8.9 - 10.3 mg/dL   GFR calc non Af Amer 49 (L) >60 mL/min   GFR calc Af Amer 57 (L) >60 mL/min   Anion gap 12 5 - 15    Comment: Performed at [facility] [address] [place], [place] [address]  CBC     Status: Abnormal    Collection Time: 10/28/18 10:52 PM  Result Value Ref Range   WBC 12.8 (H) 4.0 - 10.5 K/uL   RBC 4.48 4.22 - 5.81 MIL/uL   Hemoglobin 14.1 13.0 - 17.0 g/dL   HCT 41.5 39.0 - 52.0 %   MCV 92.6 80.0 - 100.0 fL   MCH 31.5 26.0 - 34.0 pg   MCHC 34.0 30.0 - 36.0 g/dL   RDW 15.1 11.5 - 15.5 %   Platelets 83 (L) 150 - 400 K/uL    Comment: REPEATED TO VERIFY PLATELET COUNT CONFIRMED BY SMEAR SPECIMEN CHECKED FOR CLOTS Immature Platelet Fraction may be clinically indicated, consider ordering this additional test [id number]    nRBC 0.0 0.0 - 0.2 %    Comment: Performed at [facility], [place] [address], [place], [place] [address]  Troponin I (High Sensitivity)     Status: None   Collection Time: 10/28/18 10:52 PM  Result Value Ref Range   Troponin I (High Sensitivity) 12 <18 ng/L    Comment: (NOTE) Elevated high sensitivity troponin I (hsTnI) values and significant  changes across serial measurements may suggest ACS but many other  chronic and acute conditions are known to elevate hsTnI results.  Refer to the "Links" section for chest pain algorithms and additional  guidance. Performed at [facility], [place] [address] [place], [place] [address]   SARS Coronavirus 2 (CEPHEID - Performed in [facility] hospital lab), Hosp Order     Status: Abnormal   Collection Time: 10/28/18 11:02 PM   Specimen: Nasopharyngeal Swab  Result Value Ref Range   SARS Coronavirus 2 POSITIVE (A) NEGATIVE    Comment: RESULT CALLED TO, READ BACK BY AND VERIFIED WITH: TCeasar Mons [id number] 10/29/2018 T. TYSOR (NOTE) If result is NEGATIVE SARS-CoV-2 target nucleic acids are NOT DETECTED. The SARS-CoV-2 RNA is generally detectable in upper and lower  respiratory specimens during the acute phase of infection. The lowest  concentration of SARS-CoV-2 viral copies this assay can detect is 250  copies / mL. A negative result does not preclude SARS-CoV-2 infection  and should not be used as the sole basis for  treatment or other  patient management decisions.  A negative result may occur with  improper specimen collection / handling,  submission of specimen other  than nasopharyngeal swab, presence of viral mutation(s) within the  areas targeted by this assay, and inadequate number of viral copies  (<250 copies / mL). A negative result must be combined with clinical  observations, patient history, and epidemiological information. If result is POSITIVE SARS-CoV-2 target nucleic acids are DETECTED.  The SARS-CoV-2 RNA is generally detectable in upper and lower  respiratory specimens during the acute phase of infection.  Positive  results are indicative of active infection with SARS-CoV-2.  Clinical  correlation with patient history and other diagnostic information is  necessary to determine patient infection status.  Positive results do  not rule out bacterial infection or co-infection with other viruses. If result is PRESUMPTIVE POSTIVE SARS-CoV-2 nucleic acids MAY BE PRESENT.   A presumptive positive result was obtained on the submitted specimen  and confirmed on repeat testing.  While 2019 novel coronavirus  (SARS-CoV-2) nucleic acids may be present in the submitted sample  additional confirmatory testing may be necessary for epidemiological  and / or clinical management purposes  to differentiate between  SARS-CoV-2 and other Sarbecovirus currently known to infect humans.  If clinically indicated additional testing with an alternate test  methodology [phone])  is advised. The SARS-CoV-2 RNA is generally  detectable in upper and lower respiratory specimens during the acute  phase of infection. The expected result is Negative. Fact Sheet for Patients:  StrictlyIdeas.no Fact Sheet for Healthcare Providers: BankingDealers.co.za This test is not yet approved or cleared by the [place] FDA and has been authorized for detection and/or  diagnosis of SARS-CoV-2 by FDA under an Emergency Use Authorization (EUA).  This EUA will remain in effect (meaning this test can be used) for the duration of the COVID-19 declaration under Section 564(b)(1) of the Act, 21 U.S.C. section 360bbb-3(b)(1), unless the authorization is terminated or revoked sooner. Performed at [facility], [place] [address], [place], [place] [address]   D-dimer, quantitative     Status: Abnormal   Collection Time: 10/29/18 12:28 AM  Result Value Ref Range   D-Dimer, Quant 1.06 (H) 0.00 - 0.50 ug/mL-FEU    Comment: (NOTE) At the manufacturer cut-off of 0.50 ug/mL FEU, this assay has been documented to exclude PE with a sensitivity and negative predictive value of 97 to 99%.  At this time, this assay has not been approved by the FDA to exclude DVT/VTE. Results should be correlated with clinical presentation. Performed at [facility], [place] [address], [place], [place] [address]   Procalcitonin     Status: None   Collection Time: 10/29/18 12:28 AM  Result Value Ref Range   Procalcitonin <0.10 ng/mL    Comment:        Interpretation: PCT (Procalcitonin) <= 0.5 ng/mL: Systemic infection (sepsis) is not likely. Local bacterial infection is possible. (NOTE)       Sepsis PCT Algorithm           Lower Respiratory Tract                                      Infection PCT Algorithm    ----------------------------     ----------------------------         PCT < 0.25 ng/mL                PCT < 0.10 ng/mL         Strongly encourage  Strongly discourage   discontinuation of antibiotics    initiation of antibiotics    ----------------------------     -----------------------------       PCT 0.25 - 0.50 ng/mL            PCT 0.10 - 0.25 ng/mL               OR       >80% decrease in PCT            Discourage initiation of                                            antibiotics      Encourage discontinuation           of antibiotics     ----------------------------     -----------------------------         PCT >= 0.50 ng/mL              PCT 0.26 - 0.50 ng/mL               AND        <80% decrease in PCT             Encourage initiation of                                             antibiotics       Encourage continuation           of antibiotics    ----------------------------     -----------------------------        PCT >= 0.50 ng/mL                  PCT > 0.50 ng/mL               AND         increase in PCT                  Strongly encourage                                      initiation of antibiotics    Strongly encourage escalation           of antibiotics                                     -----------------------------                                           PCT <= 0.25 ng/mL                                                 OR                                        >  80% decrease in PCT                                     Discontinue / Do not initiate                                             antibiotics Performed at [facility], [place] [address] [place], [place] [address]   Lactate dehydrogenase     Status: Abnormal   Collection Time: 10/29/18 12:28 AM  Result Value Ref Range   LDH 365 (H) 98 - 192 U/L    Comment: Performed at [facility], [place] [address] [place], [place] [address]  Triglycerides     Status: None   Collection Time: 10/29/18 12:28 AM  Result Value Ref Range   Triglycerides 103 <150 mg/dL    Comment: Performed at [facility] [address], [place], [place] [address]  Fibrinogen     Status: Abnormal   Collection Time: 10/29/18 12:28 AM  Result Value Ref Range   Fibrinogen 628 (H) 210 - 475 mg/dL    Comment: Performed at [facility] [address], [place], [place] [address]  C-reactive protein     Status: Abnormal   Collection Time: 10/29/18 12:28 AM  Result Value Ref Range   CRP 3.7 (H) <1.0 mg/dL    Comment: Performed at [facility],  [place] [address], [place], [place] [address]  Hepatic function panel     Status: Abnormal   Collection Time: 10/29/18 12:28 AM  Result Value Ref Range   Total Protein 6.1 (L) 6.5 - 8.1 g/dL   Albumin 3.2 (L) 3.5 - 5.0 g/dL   AST 77 (H) 15 - 41 U/L   ALT 60 (H) 0 - 44 U/L   Alkaline Phosphatase 78 38 - 126 U/L   Total Bilirubin 0.9 0.3 - 1.2 mg/dL   Bilirubin, Direct 0.3 (H) 0.0 - 0.2 mg/dL   Indirect Bilirubin 0.6 0.3 - 0.9 mg/dL    Comment: Performed at [facility] [address], [place], [place] [address]  Lactic acid, plasma     Status: None   Collection Time: 10/29/18 12:50 AM  Result Value Ref Range   Lactic Acid, Venous 1.5 0.5 - 1.9 mmol/L    Comment: Performed at [facility] [address], [place], [place] [address]   Dg Chest 2 View  Result Date: 10/28/2018 CLINICAL DATA:  Chest pain. Patient reports positive COVID-19 test last week and bilateral pneumonia. EXAM: CHEST - 2 VIEW COMPARISON:  04/22/2018. No recent exams available. FINDINGS: Unchanged heart size and mediastinal contours. Bilateral heterogeneous opacities in the mid and lower lung zone peripherally predominant distribution. No pulmonary edema. No pleural effusion or pneumothorax. Degenerative change in the spine. Chronic wedging of vertebra at the thoracolumbar junction. IMPRESSION: Bilateral heterogeneous opacities in a pattern consistent with COVID-19 pneumonia. Electronically Signed   By: Keith Rake M.D.   On: 10/28/2018 23:32    Pending Labs Unresulted Labs (From admission, onward)    Start     Ordered   10/29/18 0005  Blood Culture (routine x 2)  BLOOD CULTURE X 2,   STAT     10/29/18 0005   10/29/18 0005  Ferritin  Once,   STAT     10/29/18 0005   10/28/18 2250  Troponin I (High Sensitivity)  STAT Now then every 2 hours,   STAT     10/28/18 2250          Vitals/Pain Today's Vitals   10/28/18 2257 10/28/18 2348 10/29/18 0000 10/29/18 0100  BP:  132/80 (!) 120/97 139/86  Pulse:  78  80 78  Resp:  (!) 22 (!) 21 19  Temp:  99.2 F (37.3 C)    TempSrc:  Oral    SpO2:  94% 96% 95%  PainSc: 7        Isolation Precautions Airborne and Contact precautions  Medications Medications  sodium chloride flush (NS) 0.9 % injection 3 mL (3 mLs Intravenous Given 10/29/18 0111)    Mobility walks Low fall risk   Focused Assessments Cardiac Assessment Handoff:  Cardiac Rhythm: Normal sinus rhythm Lab Results  Component Value Date   CKTOTAL 52 07/06/2009   CKMB 0.6 07/06/2009   TROPONINI <0.03 04/22/2018   Lab Results  Component Value Date   DDIMER 1.06 (H) 10/29/2018   Does the Patient currently have chest pain? No  , Pulmonary Assessment Handoff:  Lung sounds: Bilateral Breath Sounds: Diminished O2 Device: Room Air        R Recommendations: See Admitting Provider Note  Report given to:   Additional Notes:  Originally dx with Covid 10-14 days ago at the [facility]. Original sats on arrival 90% room air. Currently on 2 liters oxygen

## 2018-10-29 NOTE — ED Notes (Signed)
CARELINK WILL BE HERE IN 5-8 MINUTES

## 2018-10-29 NOTE — ED Provider Notes (Signed)
[facility] EMERGENCY DEPARTMENT Provider Note   CSN: [id number] Arrival date & time: 10/28/18  2233     History   Chief Complaint Chief Complaint  Patient presents with  . Chest Pain    HPI Alex [name]. is a 78 y.o. male.     The history is provided by the patient and medical records.  Chest Pain Associated symptoms: cough, fever and shortness of breath      78 y.o. male with history of amyloidosis, arthritis, coronary artery disease, GERD, hyperlipidemia, hypertension, prior history of seizures not currently on antiepileptics, presenting to the ED with shortness of breath.  States he started having cough and low-grade fevers about 2 weeks ago and was tested at the [facility] which was positive.  He had a chest x-ray at that time which also showed COVID-19 related pneumonia.  States he thinks he was exposed at a [employer] that he attended in [place] as he was told several days later some family members had COVID-95.  States he was doing fairly well until about 3 days ago when symptoms have precipitously worsened since then.  States he is breathing very fast but constantly feels short of breath.  He continues to have productive cough with some phlegm as well as low-grade fevers and body aches.  He does have some chest discomfort, mostly with deep breathing.  He has been using albuterol at home without relief.  He lives at home with wife, she is well and has tested negative.  Past Medical History:  Diagnosis Date  . Amyloidosis ([place])   . Arthritis   . Basal cell carcinoma   . CAD (coronary artery disease)    Lexiscan Myoview 6/14: Normal study, no scar or ischemia, EF 62%  . Cataract   . Chronic kidney disease   . Colon polyp   . Coronary artery disease    moderate disease by cath 2011  . Diverticulosis of colon   . GERD (gastroesophageal reflux disease)   . Hearing loss   . Heart disease   . Hiatal hernia   . Hyperlipidemia   .  Hypertension   . MRSA (methicillin resistant staph aureus) culture positive   . Seizure disorder ([place])   . Seizures ([place])    last in 1970s    Patient Active Problem List   Diagnosis Date Noted  . Left lower lobe pneumonia ([place]) 04/22/2018  . Sepsis due to pneumonia ([place]) 04/22/2018  . Folliculitis 05/03/3233  . AL amyloidosis ([place]) 08/21/2016  . Umbilical hernia 57/32/2025  . DIZZINESS 02/19/2010  . CAD, NATIVE VESSEL 07/21/2009  . CHEST PAIN-UNSPECIFIED 07/21/2009  . HYPERLIPIDEMIA 04/30/2007  . Essential hypertension 04/30/2007  . GASTROESOPHAGEAL REFLUX DISEASE 04/30/2007  . SEIZURE DISORDER 04/30/2007  . COMPRESSION FRACTURE, L1 VERTEBRA 04/30/2007  . DIVERTICULOSIS, COLON, HX OF 04/30/2007  . Other postprocedural status(V45.89) 04/30/2007    Past Surgical History:  Procedure Laterality Date  . BUNIONECTOMY    . CARPAL TUNNEL RELEASE  1994   left  . CERVICAL FUSION    . compression fracture    . FOOT SURGERY     right -twice, left foot once  . HERNIA REPAIR     Umbilical with PVP  . KNEE ARTHROSCOPY     left knee  . [place]   left - scope  . NASAL SEPTUM SURGERY    . NECK SURGERY    . NOSE SURGERY     twice  . PAROTIDECTOMY  Right 03/09/2017  . ROTATOR CUFF REPAIR     twice, both shoulders  . ROTATOR CUFF REPAIR    . SHOULDER SURGERY    . SKIN BIOPSY    . SPINE SURGERY     C2, C3, C4  . TRIGGER FINGER RELEASE     both hands, twice  . TRIGGER FINGER RELEASE    . VASECTOMY          Home Medications    Prior to Admission medications   Medication Sig Start Date End Date Taking? Authorizing Provider  acyclovir (ZOVIRAX) 400 MG tablet Take 400 mg by mouth 2 (two) times daily.    [provider]  Ascorbic Acid (VITAMIN C) 1000 MG tablet Take 1,000 mg by mouth daily.    [provider]  aspirin EC 81 MG tablet Take 81 mg by mouth daily.    [provider]  atorvastatin (LIPITOR) 40 MG tablet Take 40 mg by mouth at  bedtime.     [provider]  cetirizine (ZYRTEC) 10 MG tablet Take 10 mg by mouth daily. Takes as needed    [provider]  Cholecalciferol (D3-1000) 25 MCG (1000 UT) tablet Take 1,000 Units by mouth daily.    [provider]  ezetimibe (ZETIA) 10 MG tablet Take 10 mg by mouth daily.  04/24/17   [provider]  famotidine (PEPCID) 20 MG tablet Take 2 tablets (40 mg total) by mouth daily. Patient not taking: Reported on 10/02/2018 10/30/17   Harle Stanford., [name]  folic acid (FOLVITE) 1 MG tablet Take 1 mg by mouth at bedtime.  04/24/17   [provider]  furosemide (LASIX) 40 MG tablet Take 40 mg by mouth daily.    [provider]  isosorbide mononitrate (IMDUR) 15 mg TB24 24 hr tablet Take 15 mg by mouth daily.    [provider]  ixazomib citrate (NINLARO) 3 MG capsule Take 1 capsule (3mg ) by mouth once weekly for 3 weeks on, 1 week off, repeat Q 4 weeks. Take on empty stomach 1hr before or 2hrs after food. 07/30/18   Brunetta Genera, MD  losartan (COZAAR) 25 MG tablet Take 25 mg by mouth daily.    [provider]  Multiple Vitamin (MULTI-VITAMINS) TABS Take 1 tablet by mouth daily. 04/24/17   [provider]  mupirocin ointment (BACTROBAN) 2 % Apply 1 application topically as needed.    [provider]  nitroGLYCERIN (NITROSTAT) 0.4 MG SL tablet Place 0.4 mg under the tongue every 5 (five) minutes as needed for chest pain.    [provider]  omeprazole (PRILOSEC) 20 MG capsule Take 20 mg by mouth daily.      [provider]  ondansetron (ZOFRAN) 8 MG tablet Take 8 mg by mouth 2 (two) times daily as needed for nausea or vomiting.    [provider]  potassium chloride SA (K-DUR,KLOR-CON) 20 MEQ tablet Take 20 mEq by mouth at bedtime.     [provider]  prochlorperazine (COMPAZINE) 10 MG tablet Take 1 tablet (10 mg total) by mouth every 6 (six) hours as needed for nausea  or vomiting. Patient not taking: Reported on 10/02/2018 03/12/18   Sandi Mealy E., [name]  triamcinolone cream (KENALOG) 0.1 % Apply 1 application topically as needed for rash.    [provider]    Family History Family History  Problem Relation Age of Onset  . Diabetes Father   . Heart disease Father   .  Heart attack Father 43  . Hypertension Father   . Cancer Sister   . Hearing loss Mother   . Eczema Sister   . Hyperlipidemia Other   . Diabetes Other   . Hypertension Other   . Seizures Other   . Thyroid disease Other   . Colon cancer Neg Hx   . Stroke Neg Hx     Social History Social History   Tobacco Use  . Smoking status: Former Smoker    Quit date: 04/25/1969    Years since quitting: 49.5  . Smokeless tobacco: Former Network engineer Use Topics  . Alcohol use: Yes    Alcohol/week: 7.0 - 14.0 standard drinks    Types: 7 - 14 Glasses of wine per week    Comment: socially shots of wiskey  . Drug use: No     Allergies   Ramipril   Review of Systems Review of Systems  Constitutional: Positive for fever.  Respiratory: Positive for cough and shortness of breath.   Cardiovascular: Positive for chest pain.  All other systems reviewed and are negative.    Physical Exam Updated Vital Signs BP 132/80   Pulse 78   Temp 99.2 F (37.3 C) (Oral)   Resp (!) 22   SpO2 94%   Physical Exam Vitals signs and nursing note reviewed.  Constitutional:      Appearance: He is well-developed.  HENT:     Head: Normocephalic and atraumatic.  Eyes:     Conjunctiva/sclera: Conjunctivae normal.     Pupils: Pupils are equal, round, and reactive to light.  Neck:     Musculoskeletal: Normal range of motion.  Cardiovascular:     Rate and Rhythm: Normal rate and regular rhythm.     Heart sounds: Normal heart sounds.  Pulmonary:     Effort: Tachypnea and accessory muscle usage present.     Breath sounds: Rhonchi present.     Comments: Tachypnea, increased work of  breathing, speaking in short sentences, O2 sats around 90-91% during exam Abdominal:     General: Bowel sounds are normal.     Palpations: Abdomen is soft.  Musculoskeletal: Normal range of motion.  Skin:    General: Skin is warm and dry.  Neurological:     Mental Status: He is alert and oriented to person, place, and time.      ED Treatments / Results  Labs (all labs ordered are listed, but only abnormal results are displayed) Labs Reviewed  SARS CORONAVIRUS 2 ([facility]) - Abnormal; Notable for the following components:      Result Value   SARS Coronavirus 2 POSITIVE (*)    All other components within normal limits  BASIC METABOLIC PANEL - Abnormal; Notable for the following components:   Glucose, Bld 117 (*)    BUN 27 (*)    Creatinine, Ser 1.39 (*)    Calcium 8.0 (*)    GFR calc non Af Amer 49 (*)    GFR calc Af Amer 57 (*)    All other components within normal limits  CBC - Abnormal; Notable for the following components:   WBC 12.8 (*)    Platelets 83 (*)    All other components within normal limits  D-DIMER, QUANTITATIVE (NOT AT [facility]) - Abnormal; Notable for the following components:   D-Dimer, Quant 1.06 (*)    All other components within normal limits  LACTATE DEHYDROGENASE - Abnormal; Notable for the following components:  LDH 365 (*)    All other components within normal limits  FIBRINOGEN - Abnormal; Notable for the following components:   Fibrinogen 628 (*)    All other components within normal limits  C-REACTIVE PROTEIN - Abnormal; Notable for the following components:   CRP 3.7 (*)    All other components within normal limits  HEPATIC FUNCTION PANEL - Abnormal; Notable for the following components:   Total Protein 6.1 (*)    Albumin 3.2 (*)    AST 77 (*)    ALT 60 (*)    Bilirubin, Direct 0.3 (*)    All other components within normal limits  CULTURE, BLOOD (ROUTINE X 2)  CULTURE, BLOOD (ROUTINE X 2)   LACTIC ACID, PLASMA  LACTIC ACID, PLASMA  TROPONIN I (HIGH SENSITIVITY)  TROPONIN I (HIGH SENSITIVITY)  PROCALCITONIN  FERRITIN  TRIGLYCERIDES    EKG None  Radiology Dg Chest 2 View  Result Date: 10/28/2018 CLINICAL DATA:  Chest pain. Patient reports positive COVID-19 test last week and bilateral pneumonia. EXAM: CHEST - 2 VIEW COMPARISON:  04/22/2018. No recent exams available. FINDINGS: Unchanged heart size and mediastinal contours. Bilateral heterogeneous opacities in the mid and lower lung zone peripherally predominant distribution. No pulmonary edema. No pleural effusion or pneumothorax. Degenerative change in the spine. Chronic wedging of vertebra at the thoracolumbar junction. IMPRESSION: Bilateral heterogeneous opacities in a pattern consistent with COVID-19 pneumonia. Electronically Signed   By: Keith Rake M.D.   On: 10/28/2018 23:32    Procedures Procedures (including critical care time)  CRITICAL CARE Performed by: Larene Pickett   Total critical care time: 35 minutes  Critical care time was exclusive of separately billable procedures and treating other patients.  Critical care was necessary to treat or prevent imminent or life-threatening deterioration.  Critical care was time spent personally by me on the following activities: development of treatment plan with patient and/or surrogate as well as nursing, discussions with consultants, evaluation of patient's response to treatment, examination of patient, obtaining history from patient or surrogate, ordering and performing treatments and interventions, ordering and review of laboratory studies, ordering and review of radiographic studies, pulse oximetry and re-evaluation of patient's condition.   Medications Ordered in ED Medications  sodium chloride flush (NS) 0.9 % injection 3 mL (3 mLs Intravenous Given 10/29/18 0111)     Initial Impression / Assessment and Plan / ED Course  I have reviewed the triage vital  signs and the nursing notes.  Pertinent labs & imaging results that were available during my care of the patient were reviewed by me and considered in my medical decision making (see chart for details).  78 year old male here with shortness of breath.  He was diagnosed with COVID-19 at the [place] 2 weeks ago and has had worsening shortness of breath over the past 3 days.  He reports ongoing cough and some low-grade fevers and body aches.  She has a low-grade fever here.  He does have increased work of breathing and is tachypneic, speaking in short sentences.  His O2 sats around 90 to 91% on room air.  He was placed on 2 L supplemental oxygen by me and sats improved to 96%, patient states feeling much better with the additional oxygen.  Chest x-ray has infiltrates consistent with COVID-19.  His inflammatory markers are elevated.  Given his worsening respiratory states I have recommended admission and patient is agreeable.  I discussed with him that this disease course does have potential for rapid deterioration that  would require further respiratory support.  He expressed to me that he would not want intubation or ventilator, but would be open to other treatments.  I have called and updated wife, Mardene Celeste, on plan of care.  She acknowledged understanding and wants continued updates when possible.  Discussed with Dr. Marlowe Sax-- will admit to [facility].  Final Clinical Impressions(s) / ED Diagnoses   Final diagnoses:  COVID-19  Shortness of breath  Cough    ED Discharge Orders    None       Larene Pickett, [name] 10/29/18 0223    Ward, Delice Bison, DO 10/29/18 [phone]

## 2018-10-29 NOTE — H&P (Signed)
History and Physical    Alex [name]. NWG:[id number] DOB: [birth year] DOA: 10/28/2018  PCP: [name], MD Patient coming from: Home  Chief Complaint: SOB  HPI: Alex [name]. is a 78 y.o. male with medical history significant of AL amyloidosis with nephrotic syndrome, hypertension, moderate nonobstructive CAD by cath 2011, aortic stenosis, GERD, hyperlipidemia, carotid artery disease presenting to the hospital for evaluation of SOB.  Patient states he was diagnosed with COVID-19 at the [place] 2 weeks ago.  He has been having shortness of breath, cough, and low-grade fevers.  He experiences substernal sharp chest pain every time he takes a deep breath. Symptoms have been getting progressively worse.  Denies any nausea, vomiting, abdominal pain, or diarrhea.  No other complaints.  ED Course: Afebrile and hemodynamically stable.  SPO2 90 to 91% on room air and placed on 2 L supplemental oxygen.  COVID-19 rapid test positive.  White count 12.8.  Lactic acid x2 normal.   High-sensitivity troponin negative.  EKG not suggestive of ACS.  D-dimer 1.06, LDH 365, fibrinogen 628, CRP 3.7. Procalcitonin, ferritin pending.  AST 77, ALT 60.  Alk phos and T bili normal.  Blood culture x2 pending.  Chest x-ray showing bilateral heterogenous opacities in a pattern consistent with COVID-19 pneumonia.  Review of Systems:  All systems reviewed and apart from history of presenting illness, are negative.  Past Medical History:  Diagnosis Date   Amyloidosis ([place])    Arthritis    Basal cell carcinoma    CAD (coronary artery disease)    Lexiscan Myoview 6/14: Normal study, no scar or ischemia, EF 62%   Cataract    Chronic kidney disease    Colon polyp    Coronary artery disease    moderate disease by cath 2011   Diverticulosis of colon    GERD (gastroesophageal reflux disease)    Hearing loss    Heart disease    Hiatal hernia    Hyperlipidemia    Hypertension    MRSA (methicillin  resistant staph aureus) culture positive    Seizure disorder ([place])    Seizures ([place])    last in 1970s    Past Surgical History:  Procedure Laterality Date   BUNIONECTOMY     CARPAL TUNNEL RELEASE  1994   left   CERVICAL FUSION     compression fracture     FOOT SURGERY     right -twice, left foot once   HERNIA REPAIR     Umbilical with PVP   KNEE ARTHROSCOPY     left knee   KNEE SURGERY  1996   left - scope   NASAL SEPTUM SURGERY     NECK SURGERY     NOSE SURGERY     twice   PAROTIDECTOMY Right 03/09/2017   ROTATOR CUFF REPAIR     twice, both shoulders   ROTATOR CUFF REPAIR     SHOULDER SURGERY     SKIN BIOPSY     SPINE SURGERY     C2, C3, C4   TRIGGER FINGER RELEASE     both hands, twice   TRIGGER FINGER RELEASE     VASECTOMY       reports that he quit smoking about 49 years ago. He has quit using smokeless tobacco. He reports current alcohol use of about 7.0 - 14.0 standard drinks of alcohol per week. He reports that he does not use drugs.  Allergies  Allergen Reactions   Ramipril Cough  Family History  Problem Relation Age of Onset   Diabetes Father    Heart disease Father    Heart attack Father 69   Hypertension Father    Cancer Sister    Hearing loss Mother    Eczema Sister    Hyperlipidemia Other    Diabetes Other    Hypertension Other    Seizures Other    Thyroid disease Other    Colon cancer Neg Hx    Stroke Neg Hx     Prior to Admission medications   Medication Sig Start Date End Date Taking? Authorizing Provider  acyclovir (ZOVIRAX) 400 MG tablet Take 400 mg by mouth 2 (two) times daily.    [provider]  Ascorbic Acid (VITAMIN C) 1000 MG tablet Take 1,000 mg by mouth daily.    [provider]  aspirin EC 81 MG tablet Take 81 mg by mouth daily.    [provider]  atorvastatin (LIPITOR) 40 MG tablet Take 40 mg by mouth at bedtime.     [provider]  cetirizine  (ZYRTEC) 10 MG tablet Take 10 mg by mouth daily. Takes as needed    [provider]  Cholecalciferol (D3-1000) 25 MCG (1000 UT) tablet Take 1,000 Units by mouth daily.    [provider]  ezetimibe (ZETIA) 10 MG tablet Take 10 mg by mouth daily.  04/24/17   [provider]  famotidine (PEPCID) 20 MG tablet Take 2 tablets (40 mg total) by mouth daily. Patient not taking: Reported on 10/02/2018 10/30/17   Harle Stanford., [name]  folic acid (FOLVITE) 1 MG tablet Take 1 mg by mouth at bedtime.  04/24/17   [provider]  furosemide (LASIX) 40 MG tablet Take 40 mg by mouth daily.    [provider]  isosorbide mononitrate (IMDUR) 15 mg TB24 24 hr tablet Take 15 mg by mouth daily.    [provider]  ixazomib citrate (NINLARO) 3 MG capsule Take 1 capsule (109m) by mouth once weekly for 3 weeks on, 1 week off, repeat Q 4 weeks. Take on empty stomach 1hr before or 2hrs after food. 07/30/18   KBrunetta Genera MD  losartan (COZAAR) 25 MG tablet Take 25 mg by mouth daily.    [provider]  Multiple Vitamin (MULTI-VITAMINS) TABS Take 1 tablet by mouth daily. 04/24/17   [provider]  mupirocin ointment (BACTROBAN) 2 % Apply 1 application topically as needed.    [provider]  nitroGLYCERIN (NITROSTAT) 0.4 MG SL tablet Place 0.4 mg under the tongue every 5 (five) minutes as needed for chest pain.    [provider]  omeprazole (PRILOSEC) 20 MG capsule Take 20 mg by mouth daily.      [provider]  ondansetron (ZOFRAN) 8 MG tablet Take 8 mg by mouth 2 (two) times daily as needed for nausea or vomiting.    [provider]  potassium chloride SA (K-DUR,KLOR-CON) 20 MEQ tablet Take 20 mEq by mouth at bedtime.     [provider]  prochlorperazine (COMPAZINE) 10 MG tablet Take 1 tablet (10 mg total) by mouth every 6 (six) hours as needed for nausea or vomiting. Patient not taking: Reported on  10/02/2018 03/12/18   TSandi MealyE., [name]  triamcinolone cream (KENALOG) 0.1 % Apply 1 application topically as needed for rash.    [provider]    Physical Exam: Vitals:   10/29/18 0100 10/29/18 0200 10/29/18 0300 10/29/18 0422  BP: 139/86 129/87 (!) 143/86 102/61  Pulse: 78 73 74   Resp: 19 (!) 23 (!) 24   Temp:    99 F (37.2 C)  TempSrc:    Oral  SpO2: 95% 95% 95% 97%    Physical Exam  Constitutional: He is oriented to person, place, and time. He appears well-developed and well-nourished. No distress.  HENT:  Head: Normocephalic.  Mouth/Throat: Oropharynx is clear and moist.  Eyes: Right eye exhibits no discharge. Left eye exhibits no discharge.  Neck: Neck supple.  Cardiovascular: Normal rate, regular rhythm and intact distal pulses.  Pulmonary/Chest: Effort normal. He has no wheezes.  Rales at the right lung base  Abdominal: Soft. Bowel sounds are normal. He exhibits no distension. There is no abdominal tenderness. There is no guarding.  Musculoskeletal:        General: No edema.  Neurological: He is alert and oriented to person, place, and time.  Skin: Skin is warm and dry. He is not diaphoretic.     Labs on Admission: I have personally reviewed following labs and imaging studies  CBC: Recent Labs  Lab 10/28/18 2252 10/29/18 0342  WBC 12.8* 11.4*  HGB 14.1 13.5  HCT 41.5 39.6  MCV 92.6 91.2  PLT 83* 71*   Basic Metabolic Panel: Recent Labs  Lab 10/28/18 2252  NA 139  K 3.9  CL 105  CO2 22  GLUCOSE 117*  BUN 27*  CREATININE 1.39*  CALCIUM 8.0*   GFR: CrCl cannot be calculated (Unknown ideal weight.). Liver Function Tests: Recent Labs  Lab 10/29/18 0028 10/29/18 0342  AST 77* 71*  ALT 60* 57*  ALKPHOS 78 65  BILITOT 0.9 1.1  PROT 6.1* 6.3*  ALBUMIN 3.2* 2.9*   No results for input(s): LIPASE, AMYLASE in the last 168 hours. No results for input(s): AMMONIA in the last 168 hours. Coagulation Profile: No results for input(s):  INR, PROTIME in the last 168 hours. Cardiac Enzymes: No results for input(s): CKTOTAL, CKMB, CKMBINDEX, TROPONINI in the last 168 hours. BNP (last 3 results) No results for input(s): PROBNP in the last 8760 hours. HbA1C: No results for input(s): HGBA1C in the last 72 hours. CBG: No results for input(s): GLUCAP in the last 168 hours. Lipid Profile: Recent Labs    10/29/18 0028  TRIG 103   Thyroid Function Tests: No results for input(s): TSH, T4TOTAL, FREET4, T3FREE, THYROIDAB in the last 72 hours. Anemia Panel: Recent Labs    10/29/18 0028  FERRITIN 123   Urine analysis:    Component Value Date/Time   COLORURINE YELLOW 04/24/2018 [place] 04/24/2018 1042   LABSPEC 1.008 04/24/2018 1042   PHURINE 5.0 04/24/2018 1042   GLUCOSEU NEGATIVE 04/24/2018 1042   HGBUR MODERATE (A) 04/24/2018 1042   BILIRUBINUR NEGATIVE 04/24/2018 1042   [place] 04/24/2018 1042   PROTEINUR 30 (A) 04/24/2018 1042   NITRITE NEGATIVE 04/24/2018 1042   LEUKOCYTESUR NEGATIVE 04/24/2018 1042    Radiological Exams on Admission: Dg Chest 2 View  Result Date: 10/28/2018 CLINICAL DATA:  Chest pain. Patient reports positive COVID-19 test last week and bilateral pneumonia. EXAM: CHEST - 2 VIEW COMPARISON:  04/22/2018. No recent exams available. FINDINGS: Unchanged heart size and mediastinal contours. Bilateral heterogeneous opacities in the mid and lower lung zone peripherally predominant distribution. No pulmonary edema. No pleural effusion or pneumothorax. Degenerative change in the spine. Chronic wedging of vertebra at the thoracolumbar junction. IMPRESSION: Bilateral heterogeneous opacities in a pattern consistent with COVID-19 pneumonia.  Electronically Signed   By: Keith Rake M.D.   On: 10/28/2018 23:32   Ct Angio Chest Pe W Or Wo Contrast  Result Date: 10/29/2018 CLINICAL DATA:  Chest pain.  COVID-19. EXAM: CT ANGIOGRAPHY CHEST WITH CONTRAST TECHNIQUE: Multidetector CT  imaging of the chest was performed using the standard protocol during bolus administration of intravenous contrast. Multiplanar CT image reconstructions and MIPs were obtained to evaluate the vascular anatomy. CONTRAST:  Sixty-four OMNIPAQUE IOHEXOL 350 MG/ML SOLN COMPARISON:  Chest x-ray 10/28/2018. CT angiogram of the chest dated 11/16/2016 FINDINGS: Cardiovascular: Aortic atherosclerosis. Coronary artery calcifications. Heart size is at the upper limits of normal. No pericardial effusion. Mediastinum/Nodes: No adenopathy. Thyroid gland and trachea are normal. Small hiatal hernia. Lungs/Pleura: Multiple bilateral patchy peripheral pulmonary infiltrates consistent with COVID-19 pneumonia. No effusions. Stable 4 x 7 mm nodule in right middle lobe since 2018. Upper Abdomen: Negative. Musculoskeletal: No chest wall abnormality. No acute or significant osseous findings. Review of the MIP images confirms the above findings. IMPRESSION: 1. No pulmonary emboli. 2. Multiple bilateral hazy pulmonary infiltrates consistent with COVID-19 pneumonia. 3.  Aortic Atherosclerosis (ICD10-I70.0). 4. Coronary artery calcifications. Electronically Signed   By: Lorriane Shire M.D.   On: 10/29/2018 04:19    EKG: Independently reviewed.  Sinus rhythm, incomplete RBBB, LAFB.  No significant change since prior tracing.  Assessment/Plan Principal Problem:   Pneumonia due to COVID-19 virus Active Problems:   Essential hypertension   AL amyloidosis (HCC)   Acute respiratory failure with hypoxia (HCC)   Thrombocytopenia (HCC)   Acute hypoxic respiratory failure secondary to COVID-19 viral pneumonia Afebrile and hemodynamically stable.  SPO2 90 to 91% and placed on 2 L supplemental oxygen.  COVID-19 rapid test positive.  White count 12.8.  Lactic acid x2 normal.  Inflammatory markers elevated.  D-dimer 1.06, LDH 365, fibrinogen 628, CRP 3.7.  Ferritin normal.  Procalcitonin normal.  Transaminases elevated (AST 77, ALT 60).  CT  angiogram negative for PE, showing multiple hazy pulmonary infiltrates consistent with COVID-19 pneumonia. -Start Solu-Medrol 1 mg/kg/day divided into doses -Patient is being transferred [facility].  Please start Remdesivir when he arrives. -Daily CRP, LDH, GFR, ferritin, d-dimer -Airborne and contact precautions -Continuous pulse ox -Supplemental oxygen -Blood culture x2 pending  Chronic thrombocytopenia, history of prior chemotherapy Followed by oncology.  Platelet count 83,000, was 121,000 a month ago.  No signs of active bleeding. -Continue to monitor platelet count -Outpatient oncology follow-up  AL amyloidosis Followed by oncology. -Outpatient oncology follow-up  Hypertension Normotensive. -Hydralazine PRN  Unable to safely order home medications at this time as pharmacy medication reconciliation is pending.  DVT prophylaxis: SCDs given thrombocytopenia Code Status: Patient wishes to be full code. Family Communication: No family available. Disposition Plan: Anticipate discharge after clinical improvement. Consults called: None  Admission status: It is my clinical opinion that admission to INPATIENT is reasonable and necessary in this 78 y.o. male  presenting with acute hypoxic respiratory failure secondary to COVID-19 viral pneumonia.  Needs treatment with IV steroids and Remdesivir.  Given the aforementioned, the predictability of an adverse outcome is felt to be significant. I expect that the patient will require at least 2 midnights in the hospital to treat this condition.   The medical decision making on this patient was of high complexity and the patient is at high risk for clinical deterioration, therefore this is a level 3 visit.  Shela Leff MD [facility] Pager [phone]  If 7PM-7AM, please contact night-coverage [email or web address] Password [email or web address]  10/29/2018, 4:43 AM

## 2018-10-29 NOTE — Progress Notes (Signed)
[facility] PROGRESS NOTE    Progress Note  Alex [name].  ZJI:[id number] DOB: 1940-12-11 DOA: 10/28/2018 PCP: [name], MD     Brief Narrative:   Alex [name]. is an 78 y.o. male past medical history significant for amyloidosis with nephrotic syndrome AL type, essential hypertension, moderate obstructive CAD by cath 2011, moderate aortic stenosis with a mean gradient of 19 mmHg, comes into the ED for shortness of breath, as per patient he was diagnosed with COVID-19 at the [facility] 2 weeks prior to admission.  He has been having lingering shortness of breath and low-grade fever until the day of admission when he started having sharp chest pain with deep inspiration which got progressively worse along with his shortness of breath. 10/28/2018 IV steroids 10/29/2018 IV Remdesivir  Assessment/Plan:   Acute respiratory failure with hypoxia (HCC) due to Pneumonia due to COVID-19 virus She has multiple risk factors including age, hypertension, coronary artery disease for acute decompensation and multiorgan failure. Symptoms started on [id number] at night cough and SOB and mild temp. after attending a funeral. On admission his saturations were less than 88% he was started on 2 L of oxygen and they have improved.  His COVID-19 rapid test PCR was positive.  CT Angio of the chest was negative for PE but showed diffuse pulmonary infiltrates. He was started on IV Solu-Medrol on 10/28/2018. I will go ahead and start IV Remdesivir today. Check CRP LDH ferritin and d-dimer daily. Cultures have been ordered, procalcitonin was less than 0.1.  Chronic thrombocytosis: Due to treatment related for his amyloidosis per oncologist. Denies any overt bleeding.  AL amyloidosis: Follow with oncology as an outpatient.  Essential hypertension Blood pressure seems to be well controlled, continue current medication IV hydralazine PRN.   DVT prophylaxis: lovenox Family Communication:none  Disposition Plan/Barrier to D/C: once off oxygen Code Status:     Code Status Orders  (From admission, onward)         Start     Ordered   10/29/18 0243  Full code  Continuous     10/29/18 0245        Code Status History    Date Active Date Inactive Code Status Order ID Comments User Context   04/22/2018 1324 04/23/2018 1931 Full Code [id number]  Patrecia Pour, MD Inpatient   Advance Care Planning Activity    Advance Directive Documentation     Most Recent Value  Type of Advance Directive  Healthcare Power of Attorney  Pre-existing out of facility DNR order (yellow form or pink MOST form)  -  "MOST" Form in Place?  -        IV Access:    Peripheral IV   Procedures and diagnostic studies:   Dg Chest 2 View  Result Date: 10/28/2018 CLINICAL DATA:  Chest pain. Patient reports positive COVID-19 test last week and bilateral pneumonia. EXAM: CHEST - 2 VIEW COMPARISON:  04/22/2018. No recent exams available. FINDINGS: Unchanged heart size and mediastinal contours. Bilateral heterogeneous opacities in the mid and lower lung zone peripherally predominant distribution. No pulmonary edema. No pleural effusion or pneumothorax. Degenerative change in the spine. Chronic wedging of vertebra at the thoracolumbar junction. IMPRESSION: Bilateral heterogeneous opacities in a pattern consistent with COVID-19 pneumonia. Electronically Signed   By: Keith Rake M.D.   On: 10/28/2018 23:32   Ct Angio Chest Pe W Or Wo Contrast  Result Date: 10/29/2018 CLINICAL DATA:  Chest pain.  COVID-19. EXAM: CT ANGIOGRAPHY CHEST WITH  CONTRAST TECHNIQUE: Multidetector CT imaging of the chest was performed using the standard protocol during bolus administration of intravenous contrast. Multiplanar CT image reconstructions and MIPs were obtained to evaluate the vascular anatomy. CONTRAST:  Sixty-four OMNIPAQUE IOHEXOL 350 MG/ML SOLN COMPARISON:  Chest x-ray 10/28/2018. CT angiogram of the chest dated  11/16/2016 FINDINGS: Cardiovascular: Aortic atherosclerosis. Coronary artery calcifications. Heart size is at the upper limits of normal. No pericardial effusion. Mediastinum/Nodes: No adenopathy. Thyroid gland and trachea are normal. Small hiatal hernia. Lungs/Pleura: Multiple bilateral patchy peripheral pulmonary infiltrates consistent with COVID-19 pneumonia. No effusions. Stable 4 x 7 mm nodule in right middle lobe since 2018. Upper Abdomen: Negative. Musculoskeletal: No chest wall abnormality. No acute or significant osseous findings. Review of the MIP images confirms the above findings. IMPRESSION: 1. No pulmonary emboli. 2. Multiple bilateral hazy pulmonary infiltrates consistent with COVID-19 pneumonia. 3.  Aortic Atherosclerosis (ICD10-I70.0). 4. Coronary artery calcifications. Electronically Signed   By: Lorriane Shire M.D.   On: 10/29/2018 04:19     Medical Consultants:    None.  Anti-Infectives:   None  Subjective:    Alex [name]. he relates he feels lousy weak tired fatigue and short of breath  Objective:    Vitals:   10/29/18 0100 10/29/18 0200 10/29/18 0300 10/29/18 0422  BP: 139/86 129/87 (!) 143/86 102/61  Pulse: 78 73 74 74  Resp: 19 (!) 23 (!) 24   Temp:    99 F (37.2 C)  TempSrc:    Oral  SpO2: 95% 95% 95% 97%  Weight:    92.2 kg  Height:    5\' 10"  (1.778 m)    Intake/Output Summary (Last 24 hours) at 10/29/2018 0725 Last data filed at 10/29/2018 0655 Gross per 24 hour  Intake 360 ml  Output 400 ml  Net -40 ml   Filed Weights   10/29/18 0422  Weight: 92.2 kg    Exam: General exam: In no acute distress. Respiratory system: Good air movement with diffuse crackles. Cardiovascular system: S1 & S2 heard, RRR. No JVD. Gastrointestinal system: Abdomen is nondistended, soft and nontender.  Central nervous system: Alert and oriented. No focal neurological deficits. Extremities: No pedal edema. Skin: No rashes, lesions or ulcers Psychiatry:  Judgement and insight appear normal. Mood & affect appropriate.   Data Reviewed:    Labs: Basic Metabolic Panel: Recent Labs  Lab 10/28/18 2252  NA 139  K 3.9  CL 105  CO2 22  GLUCOSE 117*  BUN 27*  CREATININE 1.39*  CALCIUM 8.0*   GFR Estimated Creatinine Clearance: 52.4 mL/min (A) (by C-G formula based on SCr of 1.39 mg/dL (H)). Liver Function Tests: Recent Labs  Lab 10/29/18 0028 10/29/18 0342  AST 77* 71*  ALT 60* 57*  ALKPHOS 78 65  BILITOT 0.9 1.1  PROT 6.1* 6.3*  ALBUMIN 3.2* 2.9*   No results for input(s): LIPASE, AMYLASE in the last 168 hours. No results for input(s): AMMONIA in the last 168 hours. Coagulation profile No results for input(s): INR, PROTIME in the last 168 hours. COVID-19 Labs  Recent Labs    10/29/18 0028  DDIMER 1.06*  FERRITIN 123  LDH 365*  CRP 3.7*    Lab Results  Component Value Date   SARSCOV2NAA POSITIVE (A) 10/28/2018    CBC: Recent Labs  Lab 10/28/18 2252 10/29/18 0342  WBC 12.8* 11.4*  HGB 14.1 13.5  HCT 41.5 39.6  MCV 92.6 91.2  PLT 83* 71*   Cardiac Enzymes: No results for  input(s): CKTOTAL, CKMB, CKMBINDEX, TROPONINI in the last 168 hours. BNP (last 3 results) No results for input(s): PROBNP in the last 8760 hours. CBG: No results for input(s): GLUCAP in the last 168 hours. D-Dimer: Recent Labs    10/29/18 0028  DDIMER 1.06*   Hgb A1c: No results for input(s): HGBA1C in the last 72 hours. Lipid Profile: Recent Labs    10/29/18 0028  TRIG 103   Thyroid function studies: No results for input(s): TSH, T4TOTAL, T3FREE, THYROIDAB in the last 72 hours.  Invalid input(s): FREET3 Anemia work up: Recent Labs    10/29/18 0028  FERRITIN 123   Sepsis Labs: Recent Labs  Lab 10/28/18 2252 10/29/18 0028 10/29/18 0050 10/29/18 0342  PROCALCITON  --  <0.10  --   --   WBC 12.8*  --   --  11.4*  LATICACIDVEN 1.9  --  1.5  --    Microbiology Recent Results (from the past 240 hour(s))  SARS  Coronavirus 2 (CEPHEID - Performed in [place] hospital lab), Hosp Order     Status: Abnormal   Collection Time: 10/28/18 11:02 PM   Specimen: Nasopharyngeal Swab  Result Value Ref Range Status   SARS Coronavirus 2 POSITIVE (A) NEGATIVE Final    Comment: RESULT CALLED TO, READ BACK BY AND VERIFIED WITH: TCeasar Mons 0277 10/29/2018 T. TYSOR (NOTE) If result is NEGATIVE SARS-CoV-2 target nucleic acids are NOT DETECTED. The SARS-CoV-2 RNA is generally detectable in upper and lower  respiratory specimens during the acute phase of infection. The lowest  concentration of SARS-CoV-2 viral copies this assay can detect is 250  copies / mL. A negative result does not preclude SARS-CoV-2 infection  and should not be used as the sole basis for treatment or other  patient management decisions.  A negative result may occur with  improper specimen collection / handling, submission of specimen other  than nasopharyngeal swab, presence of viral mutation(s) within the  areas targeted by this assay, and inadequate number of viral copies  (<250 copies / mL). A negative result must be combined with clinical  observations, patient history, and epidemiological information. If result is POSITIVE SARS-CoV-2 target nucleic acids are DETECTED.  The SARS-CoV-2 RNA is generally detectable in upper and lower  respiratory specimens during the acute phase of infection.  Positive  results are indicative of active infection with SARS-CoV-2.  Clinical  correlation with patient history and other diagnostic information is  necessary to determine patient infection status.  Positive results do  not rule out bacterial infection or co-infection with other viruses. If result is PRESUMPTIVE POSTIVE SARS-CoV-2 nucleic acids MAY BE PRESENT.   A presumptive positive result was obtained on the submitted specimen  and confirmed on repeat testing.  While 2019 novel coronavirus  (SARS-CoV-2) nucleic acids may be present in the  submitted sample  additional confirmatory testing may be necessary for epidemiological  and / or clinical management purposes  to differentiate between  SARS-CoV-2 and other Sarbecovirus currently known to infect humans.  If clinically indicated additional testing with an alternate test  methodology [phone])  is advised. The SARS-CoV-2 RNA is generally  detectable in upper and lower respiratory specimens during the acute  phase of infection. The expected result is Negative. Fact Sheet for Patients:  StrictlyIdeas.no Fact Sheet for Healthcare Providers: BankingDealers.co.za This test is not yet approved or cleared by the [place] FDA and has been authorized for detection and/or diagnosis of SARS-CoV-2 by FDA under an Emergency Use Authorization (  EUA).  This EUA will remain in effect (meaning this test can be used) for the duration of the COVID-19 declaration under Section 564(b)(1) of the Act, 21 U.S.C. section 360bbb-3(b)(1), unless the authorization is terminated or revoked sooner. Performed at [facility], [place] [address], [place], [place] [address]      Medications:   . methylPREDNISolone (SOLU-MEDROL) injection  50 mg Intravenous Q12H   Continuous Infusions:    LOS: 0 days   Charlynne Cousins  [facility]  10/29/2018, 7:25 AM

## 2018-10-29 NOTE — Progress Notes (Signed)
Spoke with pts wife [name].  Updated on pt status.  Pt currently resting in bed on 2L oxygen.

## 2018-10-30 DIAGNOSIS — E8581 Light chain (AL) amyloidosis: Secondary | ICD-10-CM

## 2018-10-30 DIAGNOSIS — D696 Thrombocytopenia, unspecified: Secondary | ICD-10-CM

## 2018-10-30 DIAGNOSIS — I10 Essential (primary) hypertension: Secondary | ICD-10-CM

## 2018-10-30 DIAGNOSIS — J9601 Acute respiratory failure with hypoxia: Secondary | ICD-10-CM

## 2018-10-30 DIAGNOSIS — J988 Other specified respiratory disorders: Secondary | ICD-10-CM

## 2018-10-30 LAB — BASIC METABOLIC PANEL
Anion gap: 8 (ref 5–15)
BUN: 37 mg/dL — ABNORMAL HIGH (ref 8–23)
CO2: 23 mmol/L (ref 22–32)
Calcium: 8.3 mg/dL — ABNORMAL LOW (ref 8.9–10.3)
Chloride: 105 mmol/L (ref 98–111)
Creatinine, Ser: 1.12 mg/dL (ref 0.61–1.24)
GFR calc Af Amer: >60 mL/min (ref >60)
GFR calc non Af Amer: >60 mL/min (ref >60)
Glucose, Bld: 181 mg/dL — ABNORMAL HIGH (ref 70–99)
Potassium: 4.1 mmol/L (ref 3.5–5.1)
Sodium: 136 mmol/L (ref 135–145)

## 2018-10-30 LAB — C-REACTIVE PROTEIN: CRP: 12.2 mg/dL — ABNORMAL HIGH (ref <1.0)

## 2018-10-30 LAB — LACTATE DEHYDROGENASE: LDH: 251 U/L — ABNORMAL HIGH (ref 98–192)

## 2018-10-30 LAB — D-DIMER, QUANTITATIVE: D-Dimer, Quant: 1.68 ug/mL-FEU — ABNORMAL HIGH (ref 0.00–0.50)

## 2018-10-30 LAB — FERRITIN: Ferritin: 173 ng/mL (ref 24–336)

## 2018-10-30 MED ORDER — BISMUTH SUBSALICYLATE 262 MG/15ML PO SUSP: 30.0000 mL | ORAL | Status: DC | PRN

## 2018-10-30 MED ORDER — ALUM HYDROXIDE-MAG TRISILICATE 80-20 MG PO CHEW
1.0000 | CHEWABLE_TABLET | Freq: Three times a day (TID) | ORAL | Status: DC | PRN
  Filled 2018-10-30: qty 2

## 2018-10-30 MED ORDER — CALCIUM CARBONATE ANTACID 500 MG PO CHEW
1.0000 | CHEWABLE_TABLET | Freq: Three times a day (TID) | ORAL | Status: DC | PRN
  Administered 2018-11-02: 200 mg via ORAL
  Filled 2018-10-30 (×2): qty 1

## 2018-10-30 MED ORDER — METHYLPREDNISOLONE SODIUM SUCC 125 MG IJ SOLR
80.0000 mg | Freq: Two times a day (BID) | INTRAMUSCULAR | Status: DC
  Administered 2018-10-30 – 2018-11-03 (×8): 80 mg via INTRAVENOUS
  Filled 2018-10-30 (×8): qty 2

## 2018-10-30 MED ORDER — ALUM & MAG HYDROXIDE-SIMETH 200-200-20 MG/5ML PO SUSP
15.0000 mL | Freq: Four times a day (QID) | ORAL | Status: DC | PRN
  Administered 2018-10-30 – 2018-11-16 (×3): 30 mL via ORAL
  Filled 2018-10-30 (×3): qty 30

## 2018-10-30 NOTE — Progress Notes (Signed)
Spoke with pts wife [name].  Updated on pt status.  Clarified questions regarding plan of care.  Patient currently oxygenating well and laying in prone position on stomach sleeping.

## 2018-10-30 NOTE — Progress Notes (Signed)
Dr. Loleta Books present and RN made MD aware that patient states that maalox works a little to help his burping but that tums nor pepto work at all.  Suggested trying gaviscon.  MD gave order for PRN gaviscon for burping.

## 2018-10-30 NOTE — Progress Notes (Signed)
[facility] PROGRESS NOTE    Progress Note  Alex [name].  WHQ:[id number] DOB: 1941/03/19 DOA: 10/28/2018 PCP: [name], MD     Brief Narrative:   Alex [name]. is an 78 y.o. male past medical history significant for amyloidosis with nephrotic syndrome AL type immunosuppressive therapy and steroids, essential hypertension, moderate obstructive CAD by cath 2011, moderate aortic stenosis with a mean gradient of 19 mmHg, comes into the ED for shortness of breath, as per patient he was diagnosed with COVID-19 at the [facility] 2 weeks prior to admission.  He has been having lingering shortness of breath and low-grade fever until the day of admission when he started having sharp chest pain with deep inspiration which got progressively worse along with his shortness of breath. 10/28/2018 IV steroids 10/29/2018 IV Remdesivir  Assessment/Plan:   Acute respiratory failure with hypoxia (HCC) due to Pneumonia due to COVID-19 virus He has multiple risk factors including age, hypertension, coronary artery disease for acute decompensation and multiorgan failure. He relates his symptoms started on 10/23/2018 with a cough and shortness of breath and a mild fever at night after attending a funeral of 1 of his Center friends. On admission his saturations were less than 88% he was placed on 2 L of oxygen with improvement.  His SARS-CoV-2 test PCR was positive, CT Angio of the chest was negative for PE but showed diffuse bilateral pulmonary infiltrates. He was started on IV Solu-Medrol on 10/28/2018. He was started on IV Remdesivir on 10/29/2018 will continue this for 5 days. Blood cultures have been negative till date, his procalcitonin was less than 0.1. His inflammatory markers are trending up significantly, will increase his steroids. He is currently on a suppressive therapy for his amyloid, at home he was on a course of dexamethasone which is primary care doctor put him on when he was diagnosed  with SARS-CoV-2. He relates his breathing today is much improved compared to yesterday.  Although his inflammatory markers are trending up, he relates his breathing is improved compared to yesterday, he is also on immunosuppressive therapy at home.  So I will not start Actemra today we will continue to observe him and continue to trend his inflammatory markers.  Chronic thrombocytosis: Due to treatment related for his amyloidosis per oncologist. Denies any overt bleeding.  AL amyloidosis: Follow with oncology as an outpatient.  Essential hypertension Blood pressure seems to be well controlled, continue current medication IV hydralazine PRN.   DVT prophylaxis: lovenox Family Communication:none Disposition Plan/Barrier to D/C: once off oxygen Code Status:     Code Status Orders  (From admission, onward)         Start     Ordered   10/29/18 0243  Full code  Continuous     10/29/18 0245        Code Status History    Date Active Date Inactive Code Status Order ID Comments User Context   04/22/2018 1324 04/23/2018 1931 Full Code [id number]  Patrecia Pour, MD Inpatient   Advance Care Planning Activity    Advance Directive Documentation     Most Recent Value  Type of Advance Directive  Healthcare Power of Attorney  Pre-existing out of facility DNR order (yellow form or pink MOST form)  --  "MOST" Form in Place?  --        IV Access:    Peripheral IV   Procedures and diagnostic studies:   Dg Chest 2 View  Result Date: 10/28/2018 CLINICAL  DATA:  Chest pain. Patient reports positive COVID-19 test last week and bilateral pneumonia. EXAM: CHEST - 2 VIEW COMPARISON:  04/22/2018. No recent exams available. FINDINGS: Unchanged heart size and mediastinal contours. Bilateral heterogeneous opacities in the mid and lower lung zone peripherally predominant distribution. No pulmonary edema. No pleural effusion or pneumothorax. Degenerative change in the spine. Chronic wedging of  vertebra at the thoracolumbar junction. IMPRESSION: Bilateral heterogeneous opacities in a pattern consistent with COVID-19 pneumonia. Electronically Signed   By: Keith Rake M.D.   On: 10/28/2018 23:32   Ct Angio Chest Pe W Or Wo Contrast  Result Date: 10/29/2018 CLINICAL DATA:  Chest pain.  COVID-19. EXAM: CT ANGIOGRAPHY CHEST WITH CONTRAST TECHNIQUE: Multidetector CT imaging of the chest was performed using the standard protocol during bolus administration of intravenous contrast. Multiplanar CT image reconstructions and MIPs were obtained to evaluate the vascular anatomy. CONTRAST:  Sixty-four OMNIPAQUE IOHEXOL 350 MG/ML SOLN COMPARISON:  Chest x-ray 10/28/2018. CT angiogram of the chest dated 11/16/2016 FINDINGS: Cardiovascular: Aortic atherosclerosis. Coronary artery calcifications. Heart size is at the upper limits of normal. No pericardial effusion. Mediastinum/Nodes: No adenopathy. Thyroid gland and trachea are normal. Small hiatal hernia. Lungs/Pleura: Multiple bilateral patchy peripheral pulmonary infiltrates consistent with COVID-19 pneumonia. No effusions. Stable 4 x 7 mm nodule in right middle lobe since 2018. Upper Abdomen: Negative. Musculoskeletal: No chest wall abnormality. No acute or significant osseous findings. Review of the MIP images confirms the above findings. IMPRESSION: 1. No pulmonary emboli. 2. Multiple bilateral hazy pulmonary infiltrates consistent with COVID-19 pneumonia. 3.  Aortic Atherosclerosis (ICD10-I70.0). 4. Coronary artery calcifications. Electronically Signed   By: Lorriane Shire M.D.   On: 10/29/2018 04:19     Medical Consultants:    None.  Anti-Infectives:   None  Subjective:    Alex [name]. he relates he feels much better today especially his breathing he relates he feels lighter, he is not as fatigued as yesterday.  Objective:    Vitals:   10/29/18 1800 10/29/18 2041 10/30/18 0530 10/30/18 0700  BP:  113/69 110/66   Pulse: 77 69  (!) 55 (!) 57  Resp:  20    Temp:  99.5 F (37.5 C) 98.7 F (37.1 C)   TempSrc:  Oral Oral   SpO2: 90% 92% 91% 93%  Weight:      Height:        Intake/Output Summary (Last 24 hours) at 10/30/2018 0736 Last data filed at 10/29/2018 1800 Gross per 24 hour  Intake 480 ml  Output 400 ml  Net 80 ml   Filed Weights   10/29/18 0422  Weight: 92.2 kg    Exam: General exam: In no acute distress. Respiratory system: Good air movement with diffuse crackles bilaterally.   Cardiovascular system: S1 & S2 heard, RRR. No JVD. Gastrointestinal system: Abdomen is nondistended, soft and nontender.  Central nervous system: Alert and oriented. No focal neurological deficits. Extremities: No pedal edema. Skin: No rashes, lesions or ulcers Psychiatry: Judgement and insight appear normal. Mood & affect appropriate.   Data Reviewed:    Labs: Basic Metabolic Panel: Recent Labs  Lab 10/28/18 2252 10/29/18 0545 10/30/18 0233  NA 139 137 136  K 3.9 4.2 4.1  CL 105 103 105  CO2 22 24 23   GLUCOSE 117* 136* 181*  BUN 27* 26* 37*  CREATININE 1.39* 1.21 1.12  CALCIUM 8.0* 8.0* 8.3*   GFR Estimated Creatinine Clearance: 65 mL/min (by C-G formula based on SCr of 1.12 mg/dL).  Liver Function Tests: Recent Labs  Lab 10/29/18 0028 10/29/18 0342  AST 77* 71*  ALT 60* 57*  ALKPHOS 78 65  BILITOT 0.9 1.1  PROT 6.1* 6.3*  ALBUMIN 3.2* 2.9*   No results for input(s): LIPASE, AMYLASE in the last 168 hours. No results for input(s): AMMONIA in the last 168 hours. Coagulation profile No results for input(s): INR, PROTIME in the last 168 hours. COVID-19 Labs  Recent Labs    10/29/18 0028 10/29/18 0545 10/30/18 0233  DDIMER 1.06* 1.09* 1.68*  FERRITIN 123 129 173  LDH 365* 301* 251*  CRP 3.7* 7.4* 12.2*    Lab Results  Component Value Date   SARSCOV2NAA POSITIVE (A) 10/28/2018    CBC: Recent Labs  Lab 10/28/18 2252 10/29/18 0342  WBC 12.8* 11.4*  HGB 14.1 13.5  HCT 41.5 39.6   MCV 92.6 91.2  PLT 83* 71*   Cardiac Enzymes: No results for input(s): CKTOTAL, CKMB, CKMBINDEX, TROPONINI in the last 168 hours. BNP (last 3 results) No results for input(s): PROBNP in the last 8760 hours. CBG: No results for input(s): GLUCAP in the last 168 hours. D-Dimer: Recent Labs    10/29/18 0545 10/30/18 0233  DDIMER 1.09* 1.68*   Hgb A1c: No results for input(s): HGBA1C in the last 72 hours. Lipid Profile: Recent Labs    10/29/18 0028  TRIG 103   Thyroid function studies: No results for input(s): TSH, T4TOTAL, T3FREE, THYROIDAB in the last 72 hours.  Invalid input(s): FREET3 Anemia work up: Recent Labs    10/29/18 0545 10/30/18 0233  FERRITIN 129 173   Sepsis Labs: Recent Labs  Lab 10/28/18 2252 10/29/18 0028 10/29/18 0050 10/29/18 0342  PROCALCITON  --  <0.10  --   --   WBC 12.8*  --   --  11.4*  LATICACIDVEN 1.9  --  1.5  --    Microbiology Recent Results (from the past 240 hour(s))  SARS Coronavirus 2 (CEPHEID - Performed in [place] hospital lab), Hosp Order     Status: Abnormal   Collection Time: 10/28/18 11:02 PM   Specimen: Nasopharyngeal Swab  Result Value Ref Range Status   SARS Coronavirus 2 POSITIVE (A) NEGATIVE Final    Comment: RESULT CALLED TO, READ BACK BY AND VERIFIED WITH: TCeasar Mons [id number] 10/29/2018 T. TYSOR (NOTE) If result is NEGATIVE SARS-CoV-2 target nucleic acids are NOT DETECTED. The SARS-CoV-2 RNA is generally detectable in upper and lower  respiratory specimens during the acute phase of infection. The lowest  concentration of SARS-CoV-2 viral copies this assay can detect is 250  copies / mL. A negative result does not preclude SARS-CoV-2 infection  and should not be used as the sole basis for treatment or other  patient management decisions.  A negative result may occur with  improper specimen collection / handling, submission of specimen other  than nasopharyngeal swab, presence of viral mutation(s) within the   areas targeted by this assay, and inadequate number of viral copies  (<250 copies / mL). A negative result must be combined with clinical  observations, patient history, and epidemiological information. If result is POSITIVE SARS-CoV-2 target nucleic acids are DETECTED.  The SARS-CoV-2 RNA is generally detectable in upper and lower  respiratory specimens during the acute phase of infection.  Positive  results are indicative of active infection with SARS-CoV-2.  Clinical  correlation with patient history and other diagnostic information is  necessary to determine patient infection status.  Positive results do  not rule out  bacterial infection or co-infection with other viruses. If result is PRESUMPTIVE POSTIVE SARS-CoV-2 nucleic acids MAY BE PRESENT.   A presumptive positive result was obtained on the submitted specimen  and confirmed on repeat testing.  While 2019 novel coronavirus  (SARS-CoV-2) nucleic acids may be present in the submitted sample  additional confirmatory testing may be necessary for epidemiological  and / or clinical management purposes  to differentiate between  SARS-CoV-2 and other Sarbecovirus currently known to infect humans.  If clinically indicated additional testing with an alternate test  methodology [phone])  is advised. The SARS-CoV-2 RNA is generally  detectable in upper and lower respiratory specimens during the acute  phase of infection. The expected result is Negative. Fact Sheet for Patients:  StrictlyIdeas.no Fact Sheet for Healthcare Providers: BankingDealers.co.za This test is not yet approved or cleared by the [place] FDA and has been authorized for detection and/or diagnosis of SARS-CoV-2 by FDA under an Emergency Use Authorization (EUA).  This EUA will remain in effect (meaning this test can be used) for the duration of the COVID-19 declaration under Section 564(b)(1) of the Act, 21  U.S.C. section 360bbb-3(b)(1), unless the authorization is terminated or revoked sooner. Performed at [facility], [place] [address] [place], [place] [address]      Medications:    methylPREDNISolone (SOLU-MEDROL) injection  50 mg Intravenous Q12H   Continuous Infusions:  remdesivir 100 mg in NS 250 mL        LOS: 1 day   Charlynne Cousins  [facility]  10/30/2018, 7:36 AM

## 2018-10-31 LAB — FERRITIN: Ferritin: 266 ng/mL (ref 24–336)

## 2018-10-31 LAB — BASIC METABOLIC PANEL
Anion gap: 9 (ref 5–15)
BUN: 46 mg/dL — ABNORMAL HIGH (ref 8–23)
CO2: 23 mmol/L (ref 22–32)
Calcium: 8.3 mg/dL — ABNORMAL LOW (ref 8.9–10.3)
Chloride: 108 mmol/L (ref 98–111)
Creatinine, Ser: 1.18 mg/dL (ref 0.61–1.24)
GFR calc Af Amer: >60 mL/min (ref >60)
GFR calc non Af Amer: >60 mL/min (ref >60)
Glucose, Bld: 170 mg/dL — ABNORMAL HIGH (ref 70–99)
Potassium: 4.2 mmol/L (ref 3.5–5.1)
Sodium: 140 mmol/L (ref 135–145)

## 2018-10-31 LAB — C-REACTIVE PROTEIN: CRP: 6.1 mg/dL — ABNORMAL HIGH (ref <1.0)

## 2018-10-31 LAB — LACTATE DEHYDROGENASE: LDH: 392 U/L — ABNORMAL HIGH (ref 98–192)

## 2018-10-31 LAB — D-DIMER, QUANTITATIVE: D-Dimer, Quant: 2.49 ug/mL-FEU — ABNORMAL HIGH (ref 0.00–0.50)

## 2018-10-31 MED ORDER — VITAMIN D 25 MCG (1000 UNIT) PO TABS
1000.0000 [IU] | ORAL_TABLET | Freq: Two times a day (BID) | ORAL | Status: DC
  Administered 2018-10-31 – 2018-11-17 (×34): 1000 [IU] via ORAL
  Filled 2018-10-31 (×36): qty 1

## 2018-10-31 MED ORDER — ACYCLOVIR 400 MG PO TABS
400.0000 mg | ORAL_TABLET | Freq: Two times a day (BID) | ORAL | Status: DC
  Filled 2018-10-31 (×3): qty 1

## 2018-10-31 MED ORDER — PANTOPRAZOLE SODIUM 40 MG PO TBEC
80.0000 mg | DELAYED_RELEASE_TABLET | Freq: Every day | ORAL | Status: DC
  Administered 2018-10-31 – 2018-11-17 (×18): 80 mg via ORAL
  Filled 2018-10-31 (×18): qty 2

## 2018-10-31 MED ORDER — ONDANSETRON HCL 4 MG PO TABS: 8.0000 mg | ORAL_TABLET | Freq: Two times a day (BID) | ORAL | Status: DC | PRN

## 2018-10-31 MED ORDER — FOLIC ACID 1 MG PO TABS
1.0000 mg | ORAL_TABLET | Freq: Every day | ORAL | Status: DC
  Administered 2018-10-31 – 2018-11-16 (×17): 1 mg via ORAL
  Filled 2018-10-31 (×16): qty 1

## 2018-10-31 MED ORDER — ASPIRIN EC 81 MG PO TBEC
81.0000 mg | DELAYED_RELEASE_TABLET | Freq: Every day | ORAL | Status: DC
  Administered 2018-10-31 – 2018-11-17 (×18): 81 mg via ORAL
  Filled 2018-10-31 (×18): qty 1

## 2018-10-31 MED ORDER — EZETIMIBE 10 MG PO TABS
10.0000 mg | ORAL_TABLET | Freq: Every day | ORAL | Status: DC
  Administered 2018-10-31 – 2018-11-17 (×18): 10 mg via ORAL
  Filled 2018-10-31 (×19): qty 1

## 2018-10-31 MED ORDER — ACYCLOVIR 200 MG PO CAPS
400.0000 mg | ORAL_CAPSULE | Freq: Two times a day (BID) | ORAL | Status: DC
  Administered 2018-10-31 – 2018-11-17 (×34): 400 mg via ORAL
  Filled 2018-10-31 (×40): qty 2

## 2018-10-31 MED ORDER — VITAMIN C 500 MG PO TABS
1000.0000 mg | ORAL_TABLET | Freq: Two times a day (BID) | ORAL | Status: DC
  Administered 2018-10-31 – 2018-11-17 (×34): 1000 mg via ORAL
  Filled 2018-10-31 (×34): qty 2

## 2018-10-31 MED ORDER — ADULT MULTIVITAMIN W/MINERALS CH
1.0000 | ORAL_TABLET | Freq: Every day | ORAL | Status: DC
  Administered 2018-10-31 – 2018-11-17 (×18): 1 via ORAL
  Filled 2018-10-31 (×18): qty 1

## 2018-10-31 MED ORDER — ALBUTEROL SULFATE HFA 108 (90 BASE) MCG/ACT IN AERS
2.0000 | INHALATION_SPRAY | RESPIRATORY_TRACT | Status: DC | PRN
  Administered 2018-11-03 – 2018-11-16 (×5): 2 via RESPIRATORY_TRACT
  Filled 2018-10-31 (×2): qty 6.7

## 2018-10-31 MED ORDER — FUROSEMIDE 20 MG PO TABS
40.0000 mg | ORAL_TABLET | Freq: Every day | ORAL | Status: DC
  Administered 2018-10-31 – 2018-11-03 (×4): 40 mg via ORAL
  Filled 2018-10-31 (×4): qty 2

## 2018-10-31 MED ORDER — ATORVASTATIN CALCIUM 40 MG PO TABS
40.0000 mg | ORAL_TABLET | Freq: Every day | ORAL | Status: DC
  Administered 2018-10-31 – 2018-11-16 (×17): 40 mg via ORAL
  Filled 2018-10-31 (×19): qty 1

## 2018-10-31 MED ORDER — ISOSORBIDE MONONITRATE ER 30 MG PO TB24
15.0000 mg | ORAL_TABLET | Freq: Every day | ORAL | Status: DC
  Administered 2018-10-31 – 2018-11-07 (×8): 15 mg via ORAL
  Filled 2018-10-31 (×9): qty 1

## 2018-10-31 NOTE — Progress Notes (Signed)
PROGRESS NOTE  [name]. ZSW:[id number] DOB: [birth year] DOA: 10/28/2018 PCP: [name], MD   LOS: 2 days   Brief Narrative / Interim history: 78 year old male with history of amyloidosis, nephrotic syndrome, on immunosuppressive therapy and steroids, hypertension, coronary artery disease, moderate left ear who came to the ED and was admitted to the hospital on 10/28/2018 with shortness of breath, was diagnosed with COVID-19 pneumonia and was admitted to the hospital.  Subjective: Reports that he feels better, states that his breathing is gotten better.  He denies any chest pain.  No abdominal pain, no nausea or vomiting  Assessment & Plan: Principal Problem:   Pneumonia due to COVID-19 virus Active Problems:   Essential hypertension   AL amyloidosis (HCC)   Acute respiratory failure with hypoxia (HCC)   Thrombocytopenia (HCC)   Principal Problem Acute Hypoxic Respiratory Failure due to Covid-19 Viral Illness -Patient was started on IV Solu-Medrol, Remdesivir, continue.  Subjectively reports that he feels better -Remains hypoxic requiring 4 L nasal cannula however satting in the upper 90s, attempt to wean off, encourage ambulation and pronating -His inflammatory markers are overall stable, d-dimer and LDH are slightly up however CRP is coming down. -Not a very good candidate for Actemra given immunosuppression at home   [place]    10/29/18 0545 10/30/18 0233 10/31/18 0355  DDIMER 1.09* 1.68* 2.49*  FERRITIN 129 173 266  LDH 301* 251* 392*  CRP 7.4* 12.2* 6.1*    Lab Results  Component Value Date   SARSCOV2NAA POSITIVE (A) 10/28/2018    Active Problems AL amyloidosis -Follows with oncology as an outpatient -Keep on steroids as above, resume acyclovir from home  Chronic thrombocytopenia -Possibly related to his amyloidosis treatment  Hypertension -Continue PRN's, resume home Lasix, hold Cozaar for now  Hyperlipidemia -Resume  atorvastatin, Zetia  Coronary artery disease -No chest pain, this appears stable, he is on statin, Imdur  Scheduled Meds: . methylPREDNISolone (SOLU-MEDROL) injection  80 mg Intravenous Q12H   Continuous Infusions: . remdesivir 100 mg in NS 250 mL 100 mg (10/31/18 0933)   PRN Meds:.acetaminophen **OR** acetaminophen, alum & mag hydroxide-simeth, alum hydroxide-mag trisilicate, calcium carbonate, hydrALAZINE  DVT prophylaxis: SCDs Code Status: Full code Family Communication: d/w patient in detail Disposition Plan: home when ready   Consultants:   None   Procedures:   None   Antimicrobials:  None    Objective: Vitals:   10/30/18 1936 10/30/18 2347 10/31/18 0317 10/31/18 0758  BP: 104/67 114/62 128/65 110/60  Pulse: 75 (!) 55 (!) 57 (!) 51  Resp: (!) 28 (!) 22 20 18   Temp: 99.1 F (37.3 C) 99.1 F (37.3 C) 97.9 F (36.6 C) 98.2 F (36.8 C)  TempSrc: Oral Oral Oral Oral  SpO2: 91% 94% 91% 92%  Weight:      Height:        Intake/Output Summary (Last 24 hours) at 10/31/2018 1332 Last data filed at 10/31/2018 0400 Gross per 24 hour  Intake 480 ml  Output 1300 ml  Net -820 ml   Filed Weights   10/29/18 0422  Weight: 92.2 kg    Examination:  Constitutional: NAD Eyes: PERRL, lids and conjunctivae normal ENMT: Mucous membranes are moist.  Respiratory: clear to auscultation bilaterally diminished at the bases, no wheezing, no crackles.  Tachypneic at times Cardiovascular: Regular rate and rhythm, no murmurs / rubs / gallops. No LE edema.  Abdomen: no tenderness. Bowel sounds positive.  Musculoskeletal: no clubbing / cyanosis.  Skin: no rashes Neurologic: CN 2-12 grossly intact. Strength 5/5 in all 4.  Psychiatric: Normal judgment and insight. Alert and oriented x 3. Normal mood.    Data Reviewed: I have independently reviewed following labs and imaging studies   CBC: Recent Labs  Lab 10/28/18 2252 10/29/18 0342  WBC 12.8* 11.4*  HGB 14.1 13.5  HCT  41.5 39.6  MCV 92.6 91.2  PLT 83* 71*   Basic Metabolic Panel: Recent Labs  Lab 10/28/18 2252 10/29/18 0545 10/30/18 0233 10/31/18 0355  NA 139 137 136 140  K 3.9 4.2 4.1 4.2  CL 105 103 105 108  CO2 22 24 23 23   GLUCOSE 117* 136* 181* 170*  BUN 27* 26* 37* 46*  CREATININE 1.39* 1.21 1.12 1.18  CALCIUM 8.0* 8.0* 8.3* 8.3*   GFR: Estimated Creatinine Clearance: 61.7 mL/min (by C-G formula based on SCr of 1.18 mg/dL). Liver Function Tests: Recent Labs  Lab 10/29/18 0028 10/29/18 0342  AST 77* 71*  ALT 60* 57*  ALKPHOS 78 65  BILITOT 0.9 1.1  PROT 6.1* 6.3*  ALBUMIN 3.2* 2.9*   No results for input(s): LIPASE, AMYLASE in the last 168 hours. No results for input(s): AMMONIA in the last 168 hours. Coagulation Profile: No results for input(s): INR, PROTIME in the last 168 hours. Cardiac Enzymes: No results for input(s): CKTOTAL, CKMB, CKMBINDEX, TROPONINI in the last 168 hours. BNP (last 3 results) No results for input(s): PROBNP in the last 8760 hours. HbA1C: No results for input(s): HGBA1C in the last 72 hours. CBG: No results for input(s): GLUCAP in the last 168 hours. Lipid Profile: Recent Labs    10/29/18 0028  TRIG 103   Thyroid Function Tests: No results for input(s): TSH, T4TOTAL, FREET4, T3FREE, THYROIDAB in the last 72 hours. Anemia Panel: Recent Labs    10/30/18 0233 10/31/18 0355  FERRITIN 173 266   Urine analysis:    Component Value Date/Time   COLORURINE YELLOW 04/24/2018 [place] 04/24/2018 1042   LABSPEC 1.008 04/24/2018 1042   PHURINE 5.0 04/24/2018 1042   GLUCOSEU NEGATIVE 04/24/2018 1042   HGBUR MODERATE (A) 04/24/2018 1042   BILIRUBINUR NEGATIVE 04/24/2018 1042   [place] 04/24/2018 1042   PROTEINUR 30 (A) 04/24/2018 1042   NITRITE NEGATIVE 04/24/2018 1042   LEUKOCYTESUR NEGATIVE 04/24/2018 1042   Sepsis Labs: Invalid input(s): PROCALCITONIN, LACTICIDVEN  Recent Results (from the past 240 hour(s))   SARS Coronavirus 2 (CEPHEID - Performed in [place] hospital lab), Hosp Order     Status: Abnormal   Collection Time: 10/28/18 11:02 PM   Specimen: Nasopharyngeal Swab  Result Value Ref Range Status   SARS Coronavirus 2 POSITIVE (A) NEGATIVE Final    Comment: RESULT CALLED TO, READ BACK BY AND VERIFIED WITH: TCeasar Mons [id number] 10/29/2018 T. TYSOR (NOTE) If result is NEGATIVE SARS-CoV-2 target nucleic acids are NOT DETECTED. The SARS-CoV-2 RNA is generally detectable in upper and lower  respiratory specimens during the acute phase of infection. The lowest  concentration of SARS-CoV-2 viral copies this assay can detect is 250  copies / mL. A negative result does not preclude SARS-CoV-2 infection  and should not be used as the sole basis for treatment or other  patient management decisions.  A negative result may occur with  improper specimen collection / handling, submission of specimen other  than nasopharyngeal swab, presence of viral mutation(s) within the  areas targeted by this assay, and inadequate number of viral copies  (<250 copies /  mL). A negative result must be combined with clinical  observations, patient history, and epidemiological information. If result is POSITIVE SARS-CoV-2 target nucleic acids are DETECTED.  The SARS-CoV-2 RNA is generally detectable in upper and lower  respiratory specimens during the acute phase of infection.  Positive  results are indicative of active infection with SARS-CoV-2.  Clinical  correlation with patient history and other diagnostic information is  necessary to determine patient infection status.  Positive results do  not rule out bacterial infection or co-infection with other viruses. If result is PRESUMPTIVE POSTIVE SARS-CoV-2 nucleic acids MAY BE PRESENT.   A presumptive positive result was obtained on the submitted specimen  and confirmed on repeat testing.  While 2019 novel coronavirus  (SARS-CoV-2) nucleic acids may be present  in the submitted sample  additional confirmatory testing may be necessary for epidemiological  and / or clinical management purposes  to differentiate between  SARS-CoV-2 and other Sarbecovirus currently known to infect humans.  If clinically indicated additional testing with an alternate test  methodology [phone])  is advised. The SARS-CoV-2 RNA is generally  detectable in upper and lower respiratory specimens during the acute  phase of infection. The expected result is Negative. Fact Sheet for Patients:  StrictlyIdeas.no Fact Sheet for Healthcare Providers: BankingDealers.co.za This test is not yet approved or cleared by the [place] FDA and has been authorized for detection and/or diagnosis of SARS-CoV-2 by FDA under an Emergency Use Authorization (EUA).  This EUA will remain in effect (meaning this test can be used) for the duration of the COVID-19 declaration under Section 564(b)(1) of the Act, 21 U.S.C. section 360bbb-3(b)(1), unless the authorization is terminated or revoked sooner. Performed at [facility], [place] [address] [place], [place] [address]   Blood Culture (routine x 2)     Status: None (Preliminary result)   Collection Time: 10/29/18 12:50 AM   Specimen: BLOOD  Result Value Ref Range Status   Specimen Description BLOOD RIGHT HAND  Final   Special Requests   Final    BOTTLES DRAWN AEROBIC AND ANAEROBIC Blood Culture adequate volume   Culture   Final    NO GROWTH 2 DAYS Performed at [facility], [place] [address], [place], [place] [address]    Report Status PENDING  Incomplete  Blood Culture (routine x 2)     Status: None (Preliminary result)   Collection Time: 10/29/18 12:55 AM   Specimen: BLOOD  Result Value Ref Range Status   Specimen Description BLOOD LEFT HAND  Final   Special Requests   Final    BOTTLES DRAWN AEROBIC AND ANAEROBIC Blood Culture adequate volume   Culture   Final    NO GROWTH  2 DAYS Performed at [facility], [place] [address] [place], [place] [address]    Report Status PENDING  Incomplete      Radiology Studies: No results found.   Marzetta Board, MD, PhD [facility]  Contact via  [email or web address]  [place] P: [phone] F: [phone]

## 2018-11-01 LAB — BASIC METABOLIC PANEL
Anion gap: 13 (ref 5–15)
BUN: 53 mg/dL — ABNORMAL HIGH (ref 8–23)
CO2: 22 mmol/L (ref 22–32)
Calcium: 8.2 mg/dL — ABNORMAL LOW (ref 8.9–10.3)
Chloride: 104 mmol/L (ref 98–111)
Creatinine, Ser: 1.21 mg/dL (ref 0.61–1.24)
GFR calc Af Amer: >60 mL/min (ref >60)
GFR calc non Af Amer: 58 mL/min — ABNORMAL LOW (ref >60)
Glucose, Bld: 146 mg/dL — ABNORMAL HIGH (ref 70–99)
Potassium: 4.5 mmol/L (ref 3.5–5.1)
Sodium: 139 mmol/L (ref 135–145)

## 2018-11-01 LAB — C-REACTIVE PROTEIN: CRP: 2.6 mg/dL — ABNORMAL HIGH (ref <1.0)

## 2018-11-01 LAB — MAGNESIUM: Magnesium: 2.4 mg/dL (ref 1.7–2.4)

## 2018-11-01 LAB — FERRITIN: Ferritin: 305 ng/mL (ref 24–336)

## 2018-11-01 LAB — D-DIMER, QUANTITATIVE: D-Dimer, Quant: 2.34 ug/mL-FEU — ABNORMAL HIGH (ref 0.00–0.50)

## 2018-11-01 LAB — LACTATE DEHYDROGENASE: LDH: 336 U/L — ABNORMAL HIGH (ref 98–192)

## 2018-11-01 MED ORDER — GUAIFENESIN-DM 100-10 MG/5ML PO SYRP
5.0000 mL | ORAL_SOLUTION | ORAL | Status: DC | PRN
  Administered 2018-11-01 – 2018-11-07 (×5): 5 mL via ORAL
  Filled 2018-11-01 (×2): qty 10
  Filled 2018-11-01: qty 5
  Filled 2018-11-01 (×3): qty 10

## 2018-11-01 NOTE — Care Management Important Message (Signed)
Important Message  Patient Details  Name: Alex [name]. MRN: [id number] Date of Birth: Aug 11, 1940   Medicare Important Message Given:  Yes - Important Message mailed due to current National Emergency    Verbal consent obtained due to current National Emergency    Contact Name: Jasper Loser Call Date: 11/01/18  Time: 1411 Phone: [phone] Outcome: Spoke with contact Important Message mailed to: Patient address on file     [name] 11/01/2018, 2:12 PM

## 2018-11-01 NOTE — Progress Notes (Signed)
PROGRESS NOTE  Alex [name]. YTK:[id number] DOB: March 15, 1941 DOA: 10/28/2018 PCP: [name], MD   LOS: 3 days   Brief Narrative / Interim history: 78 year old male with history of amyloidosis, nephrotic syndrome, on immunosuppressive therapy and steroids, hypertension, coronary artery disease, moderate left ear who came to the ED and was admitted to the hospital on 10/28/2018 with shortness of breath, was diagnosed with COVID-19 pneumonia and was admitted to the hospital.  Subjective: Reports shortness of breath with walking to the bathroom last night.  Asking when he can go home.  Denies any chest pain, denies any palpitations.  Assessment & Plan: Principal Problem:   Pneumonia due to COVID-19 virus Active Problems:   Essential hypertension   AL amyloidosis (HCC)   Acute respiratory failure with hypoxia (HCC)   Thrombocytopenia (HCC)   Principal Problem Acute Hypoxic Respiratory Failure due to Covid-19 Viral Illness -Patient was started on IV Solu-Medrol, Remdesivir, continue.  Subjectively reports that he feels better -Remains hypoxic requiring 4 L nasal cannula however satting in the upper 90s, attempt to wean off, encourage ambulation and pronating -Inflammatory markers are overall stable as below -Not a very good candidate for Actemra given immunosuppression at home   COVID-19 Labs  Recent Labs    10/30/18 0233 10/31/18 0355 11/01/18 0500  DDIMER 1.68* 2.49* 2.34*  FERRITIN 173 266 305  LDH 251* 392* 336*  CRP 12.2* 6.1* 2.6*    Lab Results  Component Value Date   SARSCOV2NAA POSITIVE (A) 10/28/2018    Active Problems AL amyloidosis -Follows with oncology as an outpatient -Keep on steroids as above, resume acyclovir from home  Chronic thrombocytopenia -Possibly related to his amyloidosis treatment  Hypertension -Continue Lasix, continue to hold Cozaar  Hyperlipidemia -Resume atorvastatin, Zetia  Coronary artery disease -No chest pain, this  appears stable, he is on statin, Imdur  PVCs on monitor -Asymptomatic, potassium within normal limits, check a magnesium  Scheduled Meds: . acyclovir  400 mg Oral BID  . aspirin EC  81 mg Oral Daily  . atorvastatin  40 mg Oral QHS  . cholecalciferol  1,000 Units Oral BID  . ezetimibe  10 mg Oral Daily  . folic acid  1 mg Oral QHS  . furosemide  40 mg Oral Daily  . isosorbide mononitrate  15 mg Oral Daily  . methylPREDNISolone (SOLU-MEDROL) injection  80 mg Intravenous Q12H  . multivitamin with minerals  1 tablet Oral Daily  . pantoprazole  80 mg Oral Daily  . vitamin C  1,000 mg Oral BID   Continuous Infusions: . remdesivir 100 mg in NS 250 mL 100 mg (11/01/18 0948)   PRN Meds:.acetaminophen **OR** acetaminophen, albuterol, alum & mag hydroxide-simeth, alum hydroxide-mag trisilicate, calcium carbonate, hydrALAZINE, ondansetron  DVT prophylaxis: SCDs Code Status: Full code Family Communication: d/w patient in detail Disposition Plan: home when ready   Consultants:   None   Procedures:   None   Antimicrobials:  None    Objective: Vitals:   11/01/18 0448 11/01/18 0700 11/01/18 0749 11/01/18 1204  BP: (!) 111/55  112/71 (!) 110/56  Pulse: (!) 56  61 69  Resp: 14  (!) 21 19  Temp: 97.8 F (36.6 C)  97.6 F (36.4 C) 98.6 F (37 C)  TempSrc: Oral  Oral Oral  SpO2: 97% 98% 95%   Weight:      Height:        Intake/Output Summary (Last 24 hours) at 11/01/2018 1213 Last data filed at 11/01/2018 0750 Gross  per 24 hour  Intake 276.25 ml  Output 1400 ml  Net -1123.75 ml   Filed Weights   10/29/18 0422  Weight: 92.2 kg    Examination:  Constitutional: No distress Eyes: No scleral icterus ENMT: Mucous membranes are moist.  Respiratory: Faint bilateral rhonchi, no wheezing, no crackles Cardiovascular: RRR, no murmurs, no edema  Abdomen: soft, NT, ND, BS+ Musculoskeletal: no clubbing / cyanosis.  Skin: No rashes seen Neurologic: Nonfocal, equal strength  Psychiatric: Normal judgment and insight. Alert and oriented x 3. Normal mood.    Data Reviewed: I have independently reviewed following labs and imaging studies   CBC: Recent Labs  Lab 10/28/18 2252 10/29/18 0342  WBC 12.8* 11.4*  HGB 14.1 13.5  HCT 41.5 39.6  MCV 92.6 91.2  PLT 83* 71*   Basic Metabolic Panel: Recent Labs  Lab 10/28/18 2252 10/29/18 0545 10/30/18 0233 10/31/18 0355 11/01/18 0500  NA 139 137 136 140 139  K 3.9 4.2 4.1 4.2 4.5  CL 105 103 105 108 104  CO2 22 24 23 23 22   GLUCOSE 117* 136* 181* 170* 146*  BUN 27* 26* 37* 46* 53*  CREATININE 1.39* 1.21 1.12 1.18 1.21  CALCIUM 8.0* 8.0* 8.3* 8.3* 8.2*   GFR: Estimated Creatinine Clearance: 60.2 mL/min (by C-G formula based on SCr of 1.21 mg/dL). Liver Function Tests: Recent Labs  Lab 10/29/18 0028 10/29/18 0342  AST 77* 71*  ALT 60* 57*  ALKPHOS 78 65  BILITOT 0.9 1.1  PROT 6.1* 6.3*  ALBUMIN 3.2* 2.9*   No results for input(s): LIPASE, AMYLASE in the last 168 hours. No results for input(s): AMMONIA in the last 168 hours. Coagulation Profile: No results for input(s): INR, PROTIME in the last 168 hours. Cardiac Enzymes: No results for input(s): CKTOTAL, CKMB, CKMBINDEX, TROPONINI in the last 168 hours. BNP (last 3 results) No results for input(s): PROBNP in the last 8760 hours. HbA1C: No results for input(s): HGBA1C in the last 72 hours. CBG: No results for input(s): GLUCAP in the last 168 hours. Lipid Profile: No results for input(s): CHOL, HDL, LDLCALC, TRIG, CHOLHDL, LDLDIRECT in the last 72 hours. Thyroid Function Tests: No results for input(s): TSH, T4TOTAL, FREET4, T3FREE, THYROIDAB in the last 72 hours. Anemia Panel: Recent Labs    10/31/18 0355 11/01/18 0500  FERRITIN 266 305   Urine analysis:    Component Value Date/Time   COLORURINE YELLOW 04/24/2018 [place] 04/24/2018 1042   LABSPEC 1.008 04/24/2018 1042   PHURINE 5.0 04/24/2018 1042   GLUCOSEU  NEGATIVE 04/24/2018 1042   HGBUR MODERATE (A) 04/24/2018 1042   BILIRUBINUR NEGATIVE 04/24/2018 1042   [place] 04/24/2018 1042   PROTEINUR 30 (A) 04/24/2018 1042   NITRITE NEGATIVE 04/24/2018 1042   LEUKOCYTESUR NEGATIVE 04/24/2018 1042   Sepsis Labs: Invalid input(s): PROCALCITONIN, LACTICIDVEN  Recent Results (from the past 240 hour(s))  SARS Coronavirus 2 (CEPHEID - Performed in [facility] hospital lab), Hosp Order     Status: Abnormal   Collection Time: 10/28/18 11:02 PM   Specimen: Nasopharyngeal Swab  Result Value Ref Range Status   SARS Coronavirus 2 POSITIVE (A) NEGATIVE Final    Comment: RESULT CALLED TO, READ BACK BY AND VERIFIED WITH: TCeasar Mons 0347 10/29/2018 T. TYSOR (NOTE) If result is NEGATIVE SARS-CoV-2 target nucleic acids are NOT DETECTED. The SARS-CoV-2 RNA is generally detectable in upper and lower  respiratory specimens during the acute phase of infection. The lowest  concentration of SARS-CoV-2 viral copies  this assay can detect is 250  copies / mL. A negative result does not preclude SARS-CoV-2 infection  and should not be used as the sole basis for treatment or other  patient management decisions.  A negative result may occur with  improper specimen collection / handling, submission of specimen other  than nasopharyngeal swab, presence of viral mutation(s) within the  areas targeted by this assay, and inadequate number of viral copies  (<250 copies / mL). A negative result must be combined with clinical  observations, patient history, and epidemiological information. If result is POSITIVE SARS-CoV-2 target nucleic acids are DETECTED.  The SARS-CoV-2 RNA is generally detectable in upper and lower  respiratory specimens during the acute phase of infection.  Positive  results are indicative of active infection with SARS-CoV-2.  Clinical  correlation with patient history and other diagnostic information is  necessary to determine patient  infection status.  Positive results do  not rule out bacterial infection or co-infection with other viruses. If result is PRESUMPTIVE POSTIVE SARS-CoV-2 nucleic acids MAY BE PRESENT.   A presumptive positive result was obtained on the submitted specimen  and confirmed on repeat testing.  While 2019 novel coronavirus  (SARS-CoV-2) nucleic acids may be present in the submitted sample  additional confirmatory testing may be necessary for epidemiological  and / or clinical management purposes  to differentiate between  SARS-CoV-2 and other Sarbecovirus currently known to infect humans.  If clinically indicated additional testing with an alternate test  methodology [phone])  is advised. The SARS-CoV-2 RNA is generally  detectable in upper and lower respiratory specimens during the acute  phase of infection. The expected result is Negative. Fact Sheet for Patients:  StrictlyIdeas.no Fact Sheet for Healthcare Providers: BankingDealers.co.za This test is not yet approved or cleared by the [place] FDA and has been authorized for detection and/or diagnosis of SARS-CoV-2 by FDA under an Emergency Use Authorization (EUA).  This EUA will remain in effect (meaning this test can be used) for the duration of the COVID-19 declaration under Section 564(b)(1) of the Act, 21 U.S.C. section 360bbb-3(b)(1), unless the authorization is terminated or revoked sooner. Performed at [facility], [place] [address], [place], [place] [address]   Blood Culture (routine x 2)     Status: None (Preliminary result)   Collection Time: 10/29/18 12:50 AM   Specimen: BLOOD  Result Value Ref Range Status   Specimen Description BLOOD RIGHT HAND  Final   Special Requests   Final    BOTTLES DRAWN AEROBIC AND ANAEROBIC Blood Culture adequate volume   Culture   Final    NO GROWTH 3 DAYS Performed at [facility], [place] [address] [place], [place] [address]     Report Status PENDING  Incomplete  Blood Culture (routine x 2)     Status: None (Preliminary result)   Collection Time: 10/29/18 12:55 AM   Specimen: BLOOD  Result Value Ref Range Status   Specimen Description BLOOD LEFT HAND  Final   Special Requests   Final    BOTTLES DRAWN AEROBIC AND ANAEROBIC Blood Culture adequate volume   Culture   Final    NO GROWTH 3 DAYS Performed at [facility], [place] [address], [place], [place] [address]    Report Status PENDING  Incomplete      Radiology Studies: No results found.   Marzetta Board, MD, PhD [facility]  Contact via  [email or web address]  [place] P: [phone] F: [phone]

## 2018-11-01 NOTE — Progress Notes (Signed)
Pt ambulated in hall on 3lpm oxygen via [place]. 93% oxygen saturations prior to ambulation.Tolerated fairly. Oxygen saturations after ambulation at 86%. Pt tachypneic but states he felt "better after the walk." SaO2 eventually returned to 93% with rest at 3lpm oxygen via [place].

## 2018-11-02 LAB — CBC
HCT: 37.2 % — ABNORMAL LOW (ref 39.0–52.0)
Hemoglobin: 12.5 g/dL — ABNORMAL LOW (ref 13.0–17.0)
MCH: 30.9 pg (ref 26.0–34.0)
MCHC: 33.6 g/dL (ref 30.0–36.0)
MCV: 92.1 fL (ref 80.0–100.0)
Platelets: 133 10*3/uL — ABNORMAL LOW (ref 150–400)
RBC: 4.04 MIL/uL — ABNORMAL LOW (ref 4.22–5.81)
RDW: 15 % (ref 11.5–15.5)
WBC: 13.6 10*3/uL — ABNORMAL HIGH (ref 4.0–10.5)
nRBC: 0 % (ref 0.0–0.2)

## 2018-11-02 LAB — COMPREHENSIVE METABOLIC PANEL
ALT: 127 U/L — ABNORMAL HIGH (ref 0–44)
AST: 113 U/L — ABNORMAL HIGH (ref 15–41)
Albumin: 2.6 g/dL — ABNORMAL LOW (ref 3.5–5.0)
Alkaline Phosphatase: 69 U/L (ref 38–126)
Anion gap: 11 (ref 5–15)
BUN: 51 mg/dL — ABNORMAL HIGH (ref 8–23)
CO2: 22 mmol/L (ref 22–32)
Calcium: 7.8 mg/dL — ABNORMAL LOW (ref 8.9–10.3)
Chloride: 104 mmol/L (ref 98–111)
Creatinine, Ser: 1.06 mg/dL (ref 0.61–1.24)
GFR calc Af Amer: >60 mL/min (ref >60)
GFR calc non Af Amer: >60 mL/min (ref >60)
Glucose, Bld: 195 mg/dL — ABNORMAL HIGH (ref 70–99)
Potassium: 4.2 mmol/L (ref 3.5–5.1)
Sodium: 137 mmol/L (ref 135–145)
Total Bilirubin: 0.7 mg/dL (ref 0.3–1.2)
Total Protein: 5.9 g/dL — ABNORMAL LOW (ref 6.5–8.1)

## 2018-11-02 LAB — LACTATE DEHYDROGENASE: LDH: 328 U/L — ABNORMAL HIGH (ref 98–192)

## 2018-11-02 LAB — D-DIMER, QUANTITATIVE: D-Dimer, Quant: 3.51 ug/mL-FEU — ABNORMAL HIGH (ref 0.00–0.50)

## 2018-11-02 LAB — FERRITIN: Ferritin: 308 ng/mL (ref 24–336)

## 2018-11-02 LAB — C-REACTIVE PROTEIN: CRP: 1.3 mg/dL — ABNORMAL HIGH (ref <1.0)

## 2018-11-02 MED ORDER — SALINE SPRAY 0.65 % NA SOLN
1.0000 | NASAL | Status: DC | PRN
  Filled 2018-11-02 (×2): qty 44

## 2018-11-02 NOTE — Progress Notes (Signed)
PROGRESS NOTE  Alex [name]. EPP:[id number] DOB: [birth year] DOA: 10/28/2018 PCP: [name], MD   LOS: 4 days   Brief Narrative / Interim history: 78 year old male with history of amyloidosis, nephrotic syndrome, on immunosuppressive therapy and steroids, hypertension, coronary artery disease, moderate left ear who came to the ED and was admitted to the hospital on 10/28/2018 with shortness of breath, was diagnosed with COVID-19 pneumonia and was admitted to the hospital.  Subjective: Walked more yesterday, oxygen levels improving he is on 3 L, complains of shortness of breath but appreciates improvement  Assessment & Plan: Principal Problem:   Pneumonia due to COVID-19 virus Active Problems:   Essential hypertension   AL amyloidosis ([place])   Acute respiratory failure with hypoxia (HCC)   Thrombocytopenia ([place])   Principal Problem Acute Hypoxic Respiratory Failure due to Covid-19 Viral Illness -Patient was started on IV Solu-Medrol, Remdesivir, continue, today's last days of the severe.  Subjectively reports that he feels better -Remains hypoxic but improving currently on 3 L nasal cannula -Continue ambulation and prone -Inflammatory markers are overall stable as below -Not a very good candidate for Actemra given immunosuppression at home  COVID-19 Labs  Recent Labs    10/31/18 0355 11/01/18 0500 11/02/18 0430  DDIMER 2.49* 2.34* 3.51*  FERRITIN 266 305 308  LDH 392* 336* 328*  CRP 6.1* 2.6* 1.3*    Lab Results  Component Value Date   SARSCOV2NAA POSITIVE (A) 10/28/2018    Active Problems AL amyloidosis -Follows with oncology as an outpatient -Keep on steroids as above, resume acyclovir from home  Chronic thrombocytopenia -Possibly related to his amyloidosis treatment, repeat CBC today pending  Hypertension -Continue home medications including Lasix, renal function is stable  Hyperlipidemia -Continue atorvastatin, Zetia  Coronary artery disease  -No chest pain, this appears stable, he is on statin, Imdur  PVCs on monitor -Asymptomatic, potassium within normal limits, check a magnesium  Scheduled Meds: . acyclovir  400 mg Oral BID  . aspirin EC  81 mg Oral Daily  . atorvastatin  40 mg Oral QHS  . cholecalciferol  1,000 Units Oral BID  . ezetimibe  10 mg Oral Daily  . folic acid  1 mg Oral QHS  . furosemide  40 mg Oral Daily  . isosorbide mononitrate  15 mg Oral Daily  . methylPREDNISolone (SOLU-MEDROL) injection  80 mg Intravenous Q12H  . multivitamin with minerals  1 tablet Oral Daily  . pantoprazole  80 mg Oral Daily  . vitamin C  1,000 mg Oral BID   Continuous Infusions:  PRN Meds:.acetaminophen **OR** acetaminophen, albuterol, alum & mag hydroxide-simeth, alum hydroxide-mag trisilicate, calcium carbonate, guaiFENesin-dextromethorphan, hydrALAZINE, ondansetron, sodium chloride  DVT prophylaxis: SCDs Code Status: Full code Family Communication: d/w patient in detail, discussed with wife Mardene Celeste on the phone Disposition Plan: home when ready   Consultants:   None   Procedures:   None   Antimicrobials:  None    Objective: Vitals:   11/02/18 0352 11/02/18 0400 11/02/18 0755 11/02/18 1140  BP: 109/67  (!) 103/58 115/64  Pulse: (!) 53 (!) 58 (!) 56 70  Resp: (!) 22 17 (!) 24 19  Temp: 97.9 F (36.6 C)  97.7 F (36.5 C) (!) 97.5 F (36.4 C)  TempSrc: Oral  Oral Oral  SpO2: 96% 95% 95% (!) 85%  Weight:      Height:        Intake/Output Summary (Last 24 hours) at 11/02/2018 1154 Last data filed at 11/02/2018 0400 Gross per  24 hour  Intake 240 ml  Output 1200 ml  Net -960 ml   Filed Weights   10/29/18 0422  Weight: 92.2 kg    Examination:  Constitutional: NAD, eating breakfast Eyes: No icterus ENMT: MMM Respiratory: Clear bilaterally, no wheezing, no crackles Cardiovascular: Regular rate and rhythm, no murmurs.  No peripheral edema Abdomen: Soft, nontender, nondistended, bowel sounds  positive Musculoskeletal: no clubbing / cyanosis.  Skin: No rashes seen Neurologic: No focal deficits, ambulatory Psychiatric: Normal judgment and insight. Alert and oriented x 3. Normal mood.    Data Reviewed: I have independently reviewed following labs and imaging studies   CBC: Recent Labs  Lab 10/28/18 2252 10/29/18 0342  WBC 12.8* 11.4*  HGB 14.1 13.5  HCT 41.5 39.6  MCV 92.6 91.2  PLT 83* 71*   Basic Metabolic Panel: Recent Labs  Lab 10/29/18 0545 10/30/18 0233 10/31/18 0355 11/01/18 0500 11/02/18 0430  NA 137 136 140 139 137  K 4.2 4.1 4.2 4.5 4.2  CL 103 105 108 104 104  CO2 24 23 23 22 22   GLUCOSE 136* 181* 170* 146* 195*  BUN 26* 37* 46* 53* 51*  CREATININE 1.21 1.12 1.18 1.21 1.06  CALCIUM 8.0* 8.3* 8.3* 8.2* 7.8*  MG  --   --   --  2.4  --    GFR: Estimated Creatinine Clearance: 68.7 mL/min (by C-G formula based on SCr of 1.06 mg/dL). Liver Function Tests: Recent Labs  Lab 10/29/18 0028 10/29/18 0342 11/02/18 0430  AST 77* 71* 113*  ALT 60* 57* 127*  ALKPHOS 78 65 69  BILITOT 0.9 1.1 0.7  PROT 6.1* 6.3* 5.9*  ALBUMIN 3.2* 2.9* 2.6*   No results for input(s): LIPASE, AMYLASE in the last 168 hours. No results for input(s): AMMONIA in the last 168 hours. Coagulation Profile: No results for input(s): INR, PROTIME in the last 168 hours. Cardiac Enzymes: No results for input(s): CKTOTAL, CKMB, CKMBINDEX, TROPONINI in the last 168 hours. BNP (last 3 results) No results for input(s): PROBNP in the last 8760 hours. HbA1C: No results for input(s): HGBA1C in the last 72 hours. CBG: No results for input(s): GLUCAP in the last 168 hours. Lipid Profile: No results for input(s): CHOL, HDL, LDLCALC, TRIG, CHOLHDL, LDLDIRECT in the last 72 hours. Thyroid Function Tests: No results for input(s): TSH, T4TOTAL, FREET4, T3FREE, THYROIDAB in the last 72 hours. Anemia Panel: Recent Labs    11/01/18 0500 11/02/18 0430  FERRITIN 305 308   Urine  analysis:    Component Value Date/Time   COLORURINE YELLOW 04/24/2018 [place] 04/24/2018 1042   LABSPEC 1.008 04/24/2018 1042   PHURINE 5.0 04/24/2018 1042   GLUCOSEU NEGATIVE 04/24/2018 1042   HGBUR MODERATE (A) 04/24/2018 1042   BILIRUBINUR NEGATIVE 04/24/2018 1042   [place] 04/24/2018 1042   PROTEINUR 30 (A) 04/24/2018 1042   NITRITE NEGATIVE 04/24/2018 1042   LEUKOCYTESUR NEGATIVE 04/24/2018 1042   Sepsis Labs: Invalid input(s): PROCALCITONIN, LACTICIDVEN  Recent Results (from the past 240 hour(s))  SARS Coronavirus 2 (CEPHEID - Performed in [facility] hospital lab), Hosp Order     Status: Abnormal   Collection Time: 10/28/18 11:02 PM   Specimen: Nasopharyngeal Swab  Result Value Ref Range Status   SARS Coronavirus 2 POSITIVE (A) NEGATIVE Final    Comment: RESULT CALLED TO, READ BACK BY AND VERIFIED WITH: TCeasar Mons 1245 10/29/2018 T. TYSOR (NOTE) If result is NEGATIVE SARS-CoV-2 target nucleic acids are NOT DETECTED. The SARS-CoV-2 RNA  is generally detectable in upper and lower  respiratory specimens during the acute phase of infection. The lowest  concentration of SARS-CoV-2 viral copies this assay can detect is 250  copies / mL. A negative result does not preclude SARS-CoV-2 infection  and should not be used as the sole basis for treatment or other  patient management decisions.  A negative result may occur with  improper specimen collection / handling, submission of specimen other  than nasopharyngeal swab, presence of viral mutation(s) within the  areas targeted by this assay, and inadequate number of viral copies  (<250 copies / mL). A negative result must be combined with clinical  observations, patient history, and epidemiological information. If result is POSITIVE SARS-CoV-2 target nucleic acids are DETECTED.  The SARS-CoV-2 RNA is generally detectable in upper and lower  respiratory specimens during the acute phase of  infection.  Positive  results are indicative of active infection with SARS-CoV-2.  Clinical  correlation with patient history and other diagnostic information is  necessary to determine patient infection status.  Positive results do  not rule out bacterial infection or co-infection with other viruses. If result is PRESUMPTIVE POSTIVE SARS-CoV-2 nucleic acids MAY BE PRESENT.   A presumptive positive result was obtained on the submitted specimen  and confirmed on repeat testing.  While 2019 novel coronavirus  (SARS-CoV-2) nucleic acids may be present in the submitted sample  additional confirmatory testing may be necessary for epidemiological  and / or clinical management purposes  to differentiate between  SARS-CoV-2 and other Sarbecovirus currently known to infect humans.  If clinically indicated additional testing with an alternate test  methodology [phone])  is advised. The SARS-CoV-2 RNA is generally  detectable in upper and lower respiratory specimens during the acute  phase of infection. The expected result is Negative. Fact Sheet for Patients:  StrictlyIdeas.no Fact Sheet for Healthcare Providers: BankingDealers.co.za This test is not yet approved or cleared by the [place] FDA and has been authorized for detection and/or diagnosis of SARS-CoV-2 by FDA under an Emergency Use Authorization (EUA).  This EUA will remain in effect (meaning this test can be used) for the duration of the COVID-19 declaration under Section 564(b)(1) of the Act, 21 U.S.C. section 360bbb-3(b)(1), unless the authorization is terminated or revoked sooner. Performed at [facility], [place] [address], [place], [place] [address]   Blood Culture (routine x 2)     Status: None (Preliminary result)   Collection Time: 10/29/18 12:50 AM   Specimen: BLOOD  Result Value Ref Range Status   Specimen Description BLOOD RIGHT HAND  Final   Special Requests    Final    BOTTLES DRAWN AEROBIC AND ANAEROBIC Blood Culture adequate volume   Culture   Final    NO GROWTH 4 DAYS Performed at [facility], [place] [address], [place], [place] [address]    Report Status PENDING  Incomplete  Blood Culture (routine x 2)     Status: None (Preliminary result)   Collection Time: 10/29/18 12:55 AM   Specimen: BLOOD  Result Value Ref Range Status   Specimen Description BLOOD LEFT HAND  Final   Special Requests   Final    BOTTLES DRAWN AEROBIC AND ANAEROBIC Blood Culture adequate volume   Culture   Final    NO GROWTH 4 DAYS Performed at [facility], [place] [address], [place], [place] [address]    Report Status PENDING  Incomplete      Radiology Studies:  No results found.   Marzetta Board, MD, PhD [facility]  Contact via  [email or web address]  [place] P: [phone] F: [phone]

## 2018-11-03 ENCOUNTER — Inpatient Hospital Stay (HOSPITAL_COMMUNITY): Payer: Non-veteran care

## 2018-11-03 LAB — BASIC METABOLIC PANEL
Anion gap: 9 (ref 5–15)
BUN: 47 mg/dL — ABNORMAL HIGH (ref 8–23)
CO2: 26 mmol/L (ref 22–32)
Calcium: 8.2 mg/dL — ABNORMAL LOW (ref 8.9–10.3)
Chloride: 107 mmol/L (ref 98–111)
Creatinine, Ser: 1.15 mg/dL (ref 0.61–1.24)
GFR calc Af Amer: >60 mL/min (ref >60)
GFR calc non Af Amer: >60 mL/min (ref >60)
Glucose, Bld: 190 mg/dL — ABNORMAL HIGH (ref 70–99)
Potassium: 4.7 mmol/L (ref 3.5–5.1)
Sodium: 142 mmol/L (ref 135–145)

## 2018-11-03 LAB — CULTURE, BLOOD (ROUTINE X 2)
Culture: NO GROWTH
Culture: NO GROWTH
Special Requests: ADEQUATE
Special Requests: ADEQUATE

## 2018-11-03 LAB — FERRITIN: Ferritin: 221 ng/mL (ref 24–336)

## 2018-11-03 LAB — CBC
HCT: 37.6 % — ABNORMAL LOW (ref 39.0–52.0)
Hemoglobin: 12.3 g/dL — ABNORMAL LOW (ref 13.0–17.0)
MCH: 30.4 pg (ref 26.0–34.0)
MCHC: 32.7 g/dL (ref 30.0–36.0)
MCV: 92.8 fL (ref 80.0–100.0)
Platelets: 115 10*3/uL — ABNORMAL LOW (ref 150–400)
RBC: 4.05 MIL/uL — ABNORMAL LOW (ref 4.22–5.81)
RDW: 15 % (ref 11.5–15.5)
WBC: 10.1 10*3/uL (ref 4.0–10.5)
nRBC: 0.2 % (ref 0.0–0.2)

## 2018-11-03 LAB — LACTATE DEHYDROGENASE: LDH: 307 U/L — ABNORMAL HIGH (ref 98–192)

## 2018-11-03 LAB — HEPARIN LEVEL (UNFRACTIONATED): Heparin Unfractionated: 0.81 IU/mL — ABNORMAL HIGH (ref 0.30–0.70)

## 2018-11-03 LAB — APTT: aPTT: 27 seconds (ref 24–36)

## 2018-11-03 LAB — C-REACTIVE PROTEIN: CRP: 0.8 mg/dL (ref <1.0)

## 2018-11-03 LAB — PROTIME-INR
INR: 1.2 (ref 0.8–1.2)
Prothrombin Time: 15.5 seconds — ABNORMAL HIGH (ref 11.4–15.2)

## 2018-11-03 LAB — TYPE AND SCREEN
ABO/RH(D): O POS
Antibody Screen: NEGATIVE

## 2018-11-03 LAB — D-DIMER, QUANTITATIVE: D-Dimer, Quant: 6.1 ug/mL-FEU — ABNORMAL HIGH (ref 0.00–0.50)

## 2018-11-03 MED ORDER — METHYLPREDNISOLONE SODIUM SUCC 125 MG IJ SOLR
60.0000 mg | Freq: Two times a day (BID) | INTRAMUSCULAR | Status: DC
  Administered 2018-11-03 – 2018-11-05 (×4): 60 mg via INTRAVENOUS
  Filled 2018-11-03 (×4): qty 2

## 2018-11-03 MED ORDER — SODIUM CHLORIDE 0.9% IV SOLUTION
Freq: Once | INTRAVENOUS | Status: AC
  Administered 2018-11-03: 17:00:00 via INTRAVENOUS

## 2018-11-03 MED ORDER — IOHEXOL 350 MG/ML SOLN
100.0000 mL | Freq: Once | INTRAVENOUS | Status: AC | PRN
  Administered 2018-11-03: 100 mL via INTRAVENOUS

## 2018-11-03 MED ORDER — HEPARIN (PORCINE) 25000 UT/250ML-% IV SOLN
1550.0000 [IU]/h | INTRAVENOUS | Status: DC
  Administered 2018-11-03: 1550 [IU]/h via INTRAVENOUS
  Filled 2018-11-03: qty 250

## 2018-11-03 MED ORDER — HEPARIN BOLUS VIA INFUSION
2750.0000 [IU] | Freq: Once | INTRAVENOUS | Status: AC
  Administered 2018-11-03: 2750 [IU] via INTRAVENOUS
  Filled 2018-11-03: qty 2750

## 2018-11-03 MED ORDER — ENOXAPARIN SODIUM 40 MG/0.4ML ~~LOC~~ SOLN
40.0000 mg | SUBCUTANEOUS | Status: DC
  Administered 2018-11-03: 40 mg via SUBCUTANEOUS
  Filled 2018-11-03: qty 0.4

## 2018-11-03 MED ORDER — TOCILIZUMAB 400 MG/20ML IV SOLN
8.0000 mg/kg | Freq: Once | INTRAVENOUS | Status: AC
  Administered 2018-11-03: 738 mg via INTRAVENOUS
  Filled 2018-11-03: qty 36.9

## 2018-11-03 NOTE — Progress Notes (Signed)
Pt had approx. 8 sec. Run of a-fib w/HR in the 120's; on call provider notified, no new orders at this time. Pt awakened when RN entered room, denies any palpitations or fluttering in chest, no new sx.

## 2018-11-03 NOTE — Progress Notes (Signed)
[place] for Heparin  Indication: pulmonary embolus  Allergies  Allergen Reactions  . Ramipril Cough    Patient Measurements: Height: 5\' 10"  (177.8 cm) Weight: 203 lb 4.2 oz (92.2 kg) IBW/kg (Calculated) : 73 Heparin Dosing Weight: 92 kg  Vital Signs: Temp: 98.5 F (36.9 C) (07/11 1134) Temp Source: Oral (07/11 1134) BP: 120/69 (07/11 1134) Pulse Rate: 66 (07/11 1134)  Labs: Recent Labs    11/01/18 0500 11/02/18 0430 11/02/18 1340 11/03/18 0340  HGB  --   --  12.5* 12.3*  HCT  --   --  37.2* 37.6*  PLT  --   --  133* 115*  CREATININE 1.21 1.06  --  1.15    Estimated Creatinine Clearance: 63.4 mL/min (by C-G formula based on SCr of 1.15 mg/dL).   Medical History: Past Medical History:  Diagnosis Date  . Amyloidosis ([place])   . Arthritis   . Basal cell carcinoma   . CAD (coronary artery disease)    Lexiscan Myoview 6/14: Normal study, no scar or ischemia, EF 62%  . Cataract   . Chronic kidney disease   . Colon polyp   . Coronary artery disease    moderate disease by cath 2011  . Diverticulosis of colon   . GERD (gastroesophageal reflux disease)   . Hearing loss   . Heart disease   . Hiatal hernia   . Hyperlipidemia   . Hypertension   . MRSA (methicillin resistant staph aureus) culture positive   . Seizure disorder ([place])   . Seizures ([place])    last in 1970s    Assessment: 78 y/o M with COVID PNA on Lovenox 40 mg for DVT ppx with last dose this AM. MD initiating heparin infusion due to new PE. Patient was on immunosuppressive therapy PTA and has chronic thrombocytopenia.   Goal of Therapy:  Heparin level 0.3-0.7 units/ml Monitor platelets by anticoagulation protocol: Yes   Plan:  Give 2750 units bolus x 1 (half-bolus due to recent Lovenox administration) Start heparin infusion at 1550 units/hr Check anti-Xa level in 8 hours and daily while on heparin Continue to monitor H&H and platelets (close monitoring of  platelets due to thrombocytopenia)  Ulice Dash D 11/03/2018,2:36 PM

## 2018-11-03 NOTE — Progress Notes (Signed)
I, Marzetta Board, MD, consented Subject Alex [name] (male, Date of Birth [birth year], 78 y.o.) and with diagnosis of COVID-19, in the [facility] Expanded Access Program (EAP) Research Protocol for [employer] against COVID-19.  The consent took place under following circumstances.   Subject Capacity assessed by this investigator as:  Presence of adequate emotional and mental capacity to consent with normal ability to read and write.  Consent took place in the following setting(s):  In-room, face to face   The following were present for the consent process:  Investigator   A copy of the cover letter and signed consent document was provided to subject/LAR.  The original signed consent document has been placed in the subject's physical chart and will be scanned into the electronic medical record upon discharge.  Statement of acknowledgement that the following was discussed with the subject/LAR:    1) Discussed the purpose of the research and procedures  2) Discussed risks and benefits and uncertainties of study participation 3) Discussed subject's responsibilities  4) Discussed the measures in place to maintain subject's confidentiality while a participant on the trial  5) Discussed alternatives to study participation.   6) Discussed study participation is voluntary and that the subject's care would not be jeopardized if they declined participation in the study.   7) Discussed freedom to withdraw at any time.   8) All subject/LAR questions were answered to their satisfaction.   9) In case of emergency consent, investigator agreed to discuss with subject/LAR at earliest available opportunity when the subject stabilizes and/or LAR can be located.     Final Investigator Signature  [place], [place] [phone]   Date: 11/03/2018 and 2:44 PM

## 2018-11-03 NOTE — Progress Notes (Addendum)
PROGRESS NOTE  Alex [name]. XNA:[id number] DOB: [birth year] DOA: 10/28/2018 PCP: [name], MD   LOS: 5 days   Brief Narrative / Interim history: 78 year old male with history of amyloidosis, nephrotic syndrome, on immunosuppressive therapy and steroids, hypertension, coronary artery disease, moderate left ear who came to the ED and was admitted to the hospital on 10/28/2018 with shortness of breath, was diagnosed with COVID-19 pneumonia and was admitted to the hospital.  Subjective: Oxygen requirements increased to 10 L high flow since yesterday.  Subjectively he is feeling about the same and again is asking when he can go home.  Denies any chest pain, denies any abdominal pain, nausea or vomiting  Assessment & Plan: Principal Problem:   Pneumonia due to COVID-19 virus Active Problems:   Essential hypertension   AL amyloidosis (HCC)   Acute respiratory failure with hypoxia (HCC)   Thrombocytopenia (HCC)   Principal Problem Acute Hypoxic Respiratory Failure due to Covid-19 Viral Illness -Patient was placed on IV steroids on admission and completed a course of Remdesivir with last day on 7/10.  His respiratory status worsened 7/10-7/11, will be given Actemra and patient consented for plasma -Hypoxia seems to have gotten worse, CRP is improving however the D-dimer is increasing -Continue ambulation and prone -Concern for his amyloid disease and home treatment regarding Actemra, discussed with patient's primary oncologist Dr. [name] as well as pharmacy, okay to give Actemra and this will be administered today.  Consented for plasma today -Given slight increase in d-dimer with improvement in his inflammatory markers but worsening hypoxia will check CT angiogram to rule out PE  COVID-19 Labs  Recent Labs    11/01/18 0500 11/02/18 0430 11/03/18 0340  DDIMER 2.34* 3.51* 6.10*  FERRITIN 305 308 221  LDH 336* 328* 307*  CRP 2.6* 1.3* 0.8    Lab Results  Component Value Date    SARSCOV2NAA POSITIVE (A) 10/28/2018    Active Problems AL amyloidosis -Follows with oncology as an outpatient -Keep on steroids as above, resume acyclovir from home  Chronic thrombocytopenia -Platelets improved and stable  Hypertension -Continue home medications including Lasix, renal function is stable  Hyperlipidemia -Continue atorvastatin, Zetia  Coronary artery disease -No chest pain, this appears stable, he is on statin, Imdur  PVCs on monitor -Asymptomatic, potassium within normal limits, check a magnesium  Scheduled Meds: . sodium chloride   Intravenous Once  . acyclovir  400 mg Oral BID  . aspirin EC  81 mg Oral Daily  . atorvastatin  40 mg Oral QHS  . cholecalciferol  1,000 Units Oral BID  . enoxaparin (LOVENOX) injection  40 mg Subcutaneous Q24H  . ezetimibe  10 mg Oral Daily  . folic acid  1 mg Oral QHS  . furosemide  40 mg Oral Daily  . isosorbide mononitrate  15 mg Oral Daily  . methylPREDNISolone (SOLU-MEDROL) injection  60 mg Intravenous Q12H  . multivitamin with minerals  1 tablet Oral Daily  . pantoprazole  80 mg Oral Daily  . vitamin C  1,000 mg Oral BID   Continuous Infusions: . tocilizumab (ACTEMRA) IV 738 mg (11/03/18 1140)   PRN Meds:.acetaminophen **OR** acetaminophen, albuterol, alum & mag hydroxide-simeth, alum hydroxide-mag trisilicate, calcium carbonate, guaiFENesin-dextromethorphan, hydrALAZINE, ondansetron, sodium chloride  DVT prophylaxis: Lovenox Code Status: Full code Family Communication: d/w patient in detail, discussed with wife Mardene Celeste on the phone on 7/10 Disposition Plan: home when ready   Consultants:   None   Procedures:   None   Antimicrobials:  None    Objective: Vitals:   11/03/18 0919 11/03/18 0923 11/03/18 1052 11/03/18 1134  BP:    120/69  Pulse: 63 62 63 66  Resp: 15 19 20  (!) 21  Temp:    98.5 F (36.9 C)  TempSrc:    Oral  SpO2: 91% 90% 92% (!) 86%  Weight:      Height:        Intake/Output  Summary (Last 24 hours) at 11/03/2018 1233 Last data filed at 11/03/2018 0500 Gross per 24 hour  Intake -  Output 1500 ml  Net -1500 ml   Filed Weights   10/29/18 0422  Weight: 92.2 kg    Examination:  Constitutional: States that he feels well Eyes: No scleral icterus ENMT: Moist membranes Respiratory: Diminished at the bases without wheezing or crackles Cardiovascular: Regular rate and rhythm, no murmurs appreciated.  No edema Abdomen: Soft, NT, ND, positive bowel sounds Musculoskeletal: no clubbing / cyanosis.  Skin: No rashes seen Neurologic: Nonfocal Psychiatric: Normal judgment and insight. Alert and oriented x 3. Normal mood.    Data Reviewed: I have independently reviewed following labs and imaging studies   CBC: Recent Labs  Lab 10/28/18 2252 10/29/18 0342 11/02/18 1340 11/03/18 0340  WBC 12.8* 11.4* 13.6* 10.1  HGB 14.1 13.5 12.5* 12.3*  HCT 41.5 39.6 37.2* 37.6*  MCV 92.6 91.2 92.1 92.8  PLT 83* 71* 133* 096*   Basic Metabolic Panel: Recent Labs  Lab 10/30/18 0233 10/31/18 0355 11/01/18 0500 11/02/18 0430 11/03/18 0340  NA 136 140 139 137 142  K 4.1 4.2 4.5 4.2 4.7  CL 105 108 104 104 107  CO2 23 23 22 22 26   GLUCOSE 181* 170* 146* 195* 190*  BUN 37* 46* 53* 51* 47*  CREATININE 1.12 1.18 1.21 1.06 1.15  CALCIUM 8.3* 8.3* 8.2* 7.8* 8.2*  MG  --   --  2.4  --   --    GFR: Estimated Creatinine Clearance: 63.4 mL/min (by C-G formula based on SCr of 1.15 mg/dL). Liver Function Tests: Recent Labs  Lab 10/29/18 0028 10/29/18 0342 11/02/18 0430  AST 77* 71* 113*  ALT 60* 57* 127*  ALKPHOS 78 65 69  BILITOT 0.9 1.1 0.7  PROT 6.1* 6.3* 5.9*  ALBUMIN 3.2* 2.9* 2.6*   No results for input(s): LIPASE, AMYLASE in the last 168 hours. No results for input(s): AMMONIA in the last 168 hours. Coagulation Profile: No results for input(s): INR, PROTIME in the last 168 hours. Cardiac Enzymes: No results for input(s): CKTOTAL, CKMB, CKMBINDEX, TROPONINI  in the last 168 hours. BNP (last 3 results) No results for input(s): PROBNP in the last 8760 hours. HbA1C: No results for input(s): HGBA1C in the last 72 hours. CBG: No results for input(s): GLUCAP in the last 168 hours. Lipid Profile: No results for input(s): CHOL, HDL, LDLCALC, TRIG, CHOLHDL, LDLDIRECT in the last 72 hours. Thyroid Function Tests: No results for input(s): TSH, T4TOTAL, FREET4, T3FREE, THYROIDAB in the last 72 hours. Anemia Panel: Recent Labs    11/02/18 0430 11/03/18 0340  FERRITIN 308 221   Urine analysis:    Component Value Date/Time   COLORURINE YELLOW 04/24/2018 [place] 04/24/2018 1042   LABSPEC 1.008 04/24/2018 1042   PHURINE 5.0 04/24/2018 1042   GLUCOSEU NEGATIVE 04/24/2018 1042   HGBUR MODERATE (A) 04/24/2018 1042   BILIRUBINUR NEGATIVE 04/24/2018 1042   [place] 04/24/2018 1042   PROTEINUR 30 (A) 04/24/2018 1042   NITRITE NEGATIVE 04/24/2018  [place] 04/24/2018 1042   Sepsis Labs: Invalid input(s): PROCALCITONIN, LACTICIDVEN  Recent Results (from the past 240 hour(s))  SARS Coronavirus 2 (CEPHEID - Performed in [facility] hospital lab), Hosp Order     Status: Abnormal   Collection Time: 10/28/18 11:02 PM   Specimen: Nasopharyngeal Swab  Result Value Ref Range Status   SARS Coronavirus 2 POSITIVE (A) NEGATIVE Final    Comment: RESULT CALLED TO, READ BACK BY AND VERIFIED WITH: TCeasar Mons 1324 10/29/2018 T. TYSOR (NOTE) If result is NEGATIVE SARS-CoV-2 target nucleic acids are NOT DETECTED. The SARS-CoV-2 RNA is generally detectable in upper and lower  respiratory specimens during the acute phase of infection. The lowest  concentration of SARS-CoV-2 viral copies this assay can detect is 250  copies / mL. A negative result does not preclude SARS-CoV-2 infection  and should not be used as the sole basis for treatment or other  patient management decisions.  A negative result may occur with   improper specimen collection / handling, submission of specimen other  than nasopharyngeal swab, presence of viral mutation(s) within the  areas targeted by this assay, and inadequate number of viral copies  (<250 copies / mL). A negative result must be combined with clinical  observations, patient history, and epidemiological information. If result is POSITIVE SARS-CoV-2 target nucleic acids are DETECTED.  The SARS-CoV-2 RNA is generally detectable in upper and lower  respiratory specimens during the acute phase of infection.  Positive  results are indicative of active infection with SARS-CoV-2.  Clinical  correlation with patient history and other diagnostic information is  necessary to determine patient infection status.  Positive results do  not rule out bacterial infection or co-infection with other viruses. If result is PRESUMPTIVE POSTIVE SARS-CoV-2 nucleic acids MAY BE PRESENT.   A presumptive positive result was obtained on the submitted specimen  and confirmed on repeat testing.  While 2019 novel coronavirus  (SARS-CoV-2) nucleic acids may be present in the submitted sample  additional confirmatory testing may be necessary for epidemiological  and / or clinical management purposes  to differentiate between  SARS-CoV-2 and other Sarbecovirus currently known to infect humans.  If clinically indicated additional testing with an alternate test  methodology [phone])  is advised. The SARS-CoV-2 RNA is generally  detectable in upper and lower respiratory specimens during the acute  phase of infection. The expected result is Negative. Fact Sheet for Patients:  StrictlyIdeas.no Fact Sheet for Healthcare Providers: BankingDealers.co.za This test is not yet approved or cleared by the [place] FDA and has been authorized for detection and/or diagnosis of SARS-CoV-2 by FDA under an Emergency Use Authorization (EUA).  This EUA will  remain in effect (meaning this test can be used) for the duration of the COVID-19 declaration under Section 564(b)(1) of the Act, 21 U.S.C. section 360bbb-3(b)(1), unless the authorization is terminated or revoked sooner. Performed at [facility], [place] [address], [place], [place] [address]   Blood Culture (routine x 2)     Status: None (Preliminary result)   Collection Time: 10/29/18 12:50 AM   Specimen: BLOOD  Result Value Ref Range Status   Specimen Description BLOOD RIGHT HAND  Final   Special Requests   Final    BOTTLES DRAWN AEROBIC AND ANAEROBIC Blood Culture adequate volume   Culture   Final    NO GROWTH 4 DAYS Performed at [facility], [place] [address] [place], [place] [address]    Report  Status PENDING  Incomplete  Blood Culture (routine x 2)     Status: None (Preliminary result)   Collection Time: 10/29/18 12:55 AM   Specimen: BLOOD  Result Value Ref Range Status   Specimen Description BLOOD LEFT HAND  Final   Special Requests   Final    BOTTLES DRAWN AEROBIC AND ANAEROBIC Blood Culture adequate volume   Culture   Final    NO GROWTH 4 DAYS Performed at [facility], [address], [place], [place] [address]    Report Status PENDING  Incomplete      Radiology Studies: Dg Chest Port 1 View  Result Date: 11/03/2018 CLINICAL DATA:  Dyspnea. EXAM: PORTABLE CHEST 1 VIEW COMPARISON:  Chest CT, 10/28/2018.  10/29/2018.  Chest radiographs FINDINGS: Mild increase in hazy airspace opacity in the right mid lung. Other areas of hazy airspace opacity in the mid and lower lungs bilaterally are unchanged. No convincing pleural effusion.  No pneumothorax. IMPRESSION: 1. Slight worsening in lung aeration. Mild increase in hazy airspace opacity in the right mid lung. Other previously seen areas of hazy lung opacity bilaterally are otherwise stable. Electronically Signed   By: Lajean Manes M.D.   On: 11/03/2018 10:13   Time spent: 35 minutes, and 2 separate visits,  more than 50% at bedside discussing consent for plasma, Actemra, risks/benefits/answering all patient's questions, discussion with primary oncologist over the phone  Marzetta Board, MD, PhD [facility]  Contact via  [email or web address]  [place] P: [phone] F: [phone]

## 2018-11-03 NOTE — Progress Notes (Signed)
Pt on 4L [place] while proned saturating 90 %.  Please see flowsheet for O2 titrations.    Pt left in proned position, call bell within reach, bed in low reverse trendelenburg position.  Plan of care discussed with pt.  All questions welcomed and answered.  Wife updated.  Will continue to monitor closely.

## 2018-11-03 NOTE — Progress Notes (Signed)
Received patient on 10L nasal cannula. Assisted patient with bath and increased oxygen to 15L during activity. Patient able to return to 10L nasal cannula. Vs documented

## 2018-11-03 NOTE — Progress Notes (Signed)
Assisted pt to bathroom.  Pt became very SOB and tachycardic. O2 probe was reading 90% upon my arrival.  Moved O2 probe to finger and patient was saturating in the high 70's.  O2 increased to 15L.  5 min recovery time.  Pt encouraged to prone.  O2 flow reduced to 12L. Will continue to titrate and monitor closely.

## 2018-11-03 NOTE — Progress Notes (Signed)
Notified spouse of progress.  All questions were answered and this nurse's contact number shared for further communication.   

## 2018-11-03 NOTE — Progress Notes (Signed)
Pt SOB with exertion, however recovery time much shorter compared to yesterday.  Incentive spirometer and deep breathing/cough encouraged.  OOB to chair at this time.  Will encourage proning after breakfast.  Pt saturating 88% on 12L.

## 2018-11-04 LAB — ABO/RH: ABO/RH(D): O POS

## 2018-11-04 LAB — COMPREHENSIVE METABOLIC PANEL
ALT: 91 U/L — ABNORMAL HIGH (ref 0–44)
AST: 50 U/L — ABNORMAL HIGH (ref 15–41)
Albumin: 2.7 g/dL — ABNORMAL LOW (ref 3.5–5.0)
Alkaline Phosphatase: 73 U/L (ref 38–126)
Anion gap: 11 (ref 5–15)
BUN: 48 mg/dL — ABNORMAL HIGH (ref 8–23)
CO2: 24 mmol/L (ref 22–32)
Calcium: 8 mg/dL — ABNORMAL LOW (ref 8.9–10.3)
Chloride: 104 mmol/L (ref 98–111)
Creatinine, Ser: 1.19 mg/dL (ref 0.61–1.24)
GFR calc Af Amer: >60 mL/min (ref >60)
GFR calc non Af Amer: 59 mL/min — ABNORMAL LOW (ref >60)
Glucose, Bld: 204 mg/dL — ABNORMAL HIGH (ref 70–99)
Potassium: 4 mmol/L (ref 3.5–5.1)
Sodium: 139 mmol/L (ref 135–145)
Total Bilirubin: 0.8 mg/dL (ref 0.3–1.2)
Total Protein: 5.7 g/dL — ABNORMAL LOW (ref 6.5–8.1)

## 2018-11-04 LAB — D-DIMER, QUANTITATIVE: D-Dimer, Quant: 7.76 ug/mL-FEU — ABNORMAL HIGH (ref 0.00–0.50)

## 2018-11-04 LAB — CBC
HCT: 35.2 % — ABNORMAL LOW (ref 39.0–52.0)
Hemoglobin: 11.6 g/dL — ABNORMAL LOW (ref 13.0–17.0)
MCH: 30.4 pg (ref 26.0–34.0)
MCHC: 33 g/dL (ref 30.0–36.0)
MCV: 92.1 fL (ref 80.0–100.0)
Platelets: 113 10*3/uL — ABNORMAL LOW (ref 150–400)
RBC: 3.82 MIL/uL — ABNORMAL LOW (ref 4.22–5.81)
RDW: 15.1 % (ref 11.5–15.5)
WBC: 10.3 10*3/uL (ref 4.0–10.5)
nRBC: 0 % (ref 0.0–0.2)

## 2018-11-04 LAB — HEPARIN LEVEL (UNFRACTIONATED): Heparin Unfractionated: 0.95 IU/mL — ABNORMAL HIGH (ref 0.30–0.70)

## 2018-11-04 LAB — BPAM FFP
Blood Product Expiration Date: 202007121620
ISSUE DATE / TIME: 202007111622
Unit Type and Rh: 6200

## 2018-11-04 LAB — PREPARE FRESH FROZEN PLASMA: Unit division: 0

## 2018-11-04 LAB — C-REACTIVE PROTEIN: CRP: <0.8 mg/dL (ref <1.0)

## 2018-11-04 LAB — LACTATE DEHYDROGENASE: LDH: 310 U/L — ABNORMAL HIGH (ref 98–192)

## 2018-11-04 LAB — FERRITIN: Ferritin: 158 ng/mL (ref 24–336)

## 2018-11-04 MED ORDER — APIXABAN 5 MG PO TABS
5.0000 mg | ORAL_TABLET | Freq: Two times a day (BID) | ORAL | Status: DC
  Administered 2018-11-11 – 2018-11-17 (×13): 5 mg via ORAL
  Filled 2018-11-04 (×13): qty 1

## 2018-11-04 MED ORDER — HEPARIN (PORCINE) 25000 UT/250ML-% IV SOLN
1400.0000 [IU]/h | INTRAVENOUS | Status: DC
  Administered 2018-11-04: 1400 [IU]/h via INTRAVENOUS
  Filled 2018-11-04: qty 250

## 2018-11-04 MED ORDER — APIXABAN 5 MG PO TABS
10.0000 mg | ORAL_TABLET | Freq: Two times a day (BID) | ORAL | Status: AC
  Administered 2018-11-04 – 2018-11-10 (×14): 10 mg via ORAL
  Filled 2018-11-04 (×14): qty 2

## 2018-11-04 NOTE — Progress Notes (Signed)
PROGRESS NOTE  Alex [name]. AYT:[id number] DOB: 12-21-1940 DOA: 10/28/2018 PCP: [name], MD   LOS: 6 days   Brief Narrative / Interim history: 78 year old male with history of amyloidosis, nephrotic syndrome, on immunosuppressive therapy and steroids, hypertension, coronary artery disease, moderate left ear who came to the ED and was admitted to the hospital on 10/28/2018 with shortness of breath, was diagnosed with COVID-19 pneumonia and was admitted to the hospital.  Subjective: Breathing about the same, his requirements have gone from 10 L to 4 this morning.  Denies any chest pain.  No abdominal pain, no nausea or vomiting  Assessment & Plan: Principal Problem:   Pneumonia due to COVID-19 virus Active Problems:   Essential hypertension   AL amyloidosis (HCC)   Acute respiratory failure with hypoxia (HCC)   Thrombocytopenia (HCC)   Principal Problem Acute Hypoxic Respiratory Failure due to Covid-19 Viral Illness -Patient was placed on IV steroids on admission and completed a course of Remdesivir with last day on 7/10.  His respiratory status worsened 7/10-7/11, and received Actemra as well as convalescent plasma on 7/11.  Actemra treatment was discussed with patient's primary oncologist Dr. [name] -Oxygen requirements improved some this morning he is on 4 L high flow -Also placed on anticoagulation for PE as discussed below  COVID-19 Labs  Recent Labs    11/02/18 0430 11/03/18 0340 11/04/18 0133  DDIMER 3.51* 6.10* 7.76*  FERRITIN 308 221 158  LDH 328* 307* 310*  CRP 1.3* 0.8 <0.8    Lab Results  Component Value Date   SARSCOV2NAA POSITIVE (A) 10/28/2018    Active Problems Acute PE -Due to worsening hypoxia and slight increase in the d-dimer underwent a CT angiogram on 7/11.  CT scan was positive for PE with subsegmental embolus in the medial segment of the right lower lobe, discussed with radiology over the phone and unlikely to explain his degree of  hypoxia.  CT scan also showed diffuse bilateral groundglass pulmonary opacities consistent with COVID-19 evolution -He was placed on heparin infusion, has remained stable, no bleeding, will be transitioned to Eliquis today  AL amyloidosis -Follows with oncology as an outpatient -Continue steroids and home medications  Chronic thrombocytopenia -Platelets stable in the 100 range  Hypertension -Continue home medications, Lasix held yesterday due to receiving IV contrast  Hyperlipidemia -Continue atorvastatin, Zetia  Coronary artery disease -No chest pain, this appears stable, he is on statin, Imdur  PVCs on monitor -Asymptomatic, stable  Scheduled Meds: . acyclovir  400 mg Oral BID  . apixaban  10 mg Oral BID   Followed by  . [START ON 11/11/2018] apixaban  5 mg Oral BID  . aspirin EC  81 mg Oral Daily  . atorvastatin  40 mg Oral QHS  . cholecalciferol  1,000 Units Oral BID  . ezetimibe  10 mg Oral Daily  . folic acid  1 mg Oral QHS  . isosorbide mononitrate  15 mg Oral Daily  . methylPREDNISolone (SOLU-MEDROL) injection  60 mg Intravenous Q12H  . multivitamin with minerals  1 tablet Oral Daily  . pantoprazole  80 mg Oral Daily  . vitamin C  1,000 mg Oral BID   Continuous Infusions:  PRN Meds:.acetaminophen **OR** acetaminophen, albuterol, alum & mag hydroxide-simeth, alum hydroxide-mag trisilicate, calcium carbonate, guaiFENesin-dextromethorphan, hydrALAZINE, ondansetron, sodium chloride  DVT prophylaxis: Lovenox Code Status: Full code Family Communication: d/w patient in detail, discussed with wife Mardene Celeste on the phone Disposition Plan: home when ready   Consultants:   None  Procedures:   None   Antimicrobials:  None    Objective: Vitals:   11/03/18 2015 11/03/18 2346 11/04/18 0407 11/04/18 0753  BP:  101/67 121/69 140/81  Pulse:  64 62   Resp: 20 19 18    Temp:  99.2 F (37.3 C) 98.8 F (37.1 C) 97.8 F (36.6 C)  TempSrc:  Oral Oral Oral  SpO2:  91% 98% 92%   Weight:      Height:        Intake/Output Summary (Last 24 hours) at 11/04/2018 1130 Last data filed at 11/04/2018 0432 Gross per 24 hour  Intake 774.84 ml  Output 2500 ml  Net -1725.16 ml   Filed Weights   10/29/18 0422  Weight: 92.2 kg    Examination:  Constitutional: Eating breakfast, in no apparent distress Eyes: No icterus seen ENMT: Moist mixed membranes Respiratory: No wheezing or crackles, faint bibasilar rhonchi Cardiovascular: Regular rate and rhythm, no murmurs.  No peripheral edema Abdomen: No tenderness, positive bowel sounds Musculoskeletal: no clubbing / cyanosis.  Skin: No rashes seen Neurologic: No focal deficits, equal strength Psychiatric: Normal judgment and insight. Alert and oriented x 3. Normal mood.    Data Reviewed: I have independently reviewed following labs and imaging studies   CBC: Recent Labs  Lab 10/28/18 2252 10/29/18 0342 11/02/18 1340 11/03/18 0340 11/04/18 0133  WBC 12.8* 11.4* 13.6* 10.1 10.3  HGB 14.1 13.5 12.5* 12.3* 11.6*  HCT 41.5 39.6 37.2* 37.6* 35.2*  MCV 92.6 91.2 92.1 92.8 92.1  PLT 83* 71* 133* 115* 992*   Basic Metabolic Panel: Recent Labs  Lab 10/31/18 0355 11/01/18 0500 11/02/18 0430 11/03/18 0340 11/04/18 0133  NA 140 139 137 142 139  K 4.2 4.5 4.2 4.7 4.0  CL 108 104 104 107 104  CO2 23 22 22 26 24   GLUCOSE 170* 146* 195* 190* 204*  BUN 46* 53* 51* 47* 48*  CREATININE 1.18 1.21 1.06 1.15 1.19  CALCIUM 8.3* 8.2* 7.8* 8.2* 8.0*  MG  --  2.4  --   --   --    GFR: Estimated Creatinine Clearance: 61.2 mL/min (by C-G formula based on SCr of 1.19 mg/dL). Liver Function Tests: Recent Labs  Lab 10/29/18 0028 10/29/18 0342 11/02/18 0430 11/04/18 0133  AST 77* 71* 113* 50*  ALT 60* 57* 127* 91*  ALKPHOS 78 65 69 73  BILITOT 0.9 1.1 0.7 0.8  PROT 6.1* 6.3* 5.9* 5.7*  ALBUMIN 3.2* 2.9* 2.6* 2.7*   No results for input(s): LIPASE, AMYLASE in the last 168 hours. No results for input(s):  AMMONIA in the last 168 hours. Coagulation Profile: Recent Labs  Lab 11/03/18 1500  INR 1.2   Cardiac Enzymes: No results for input(s): CKTOTAL, CKMB, CKMBINDEX, TROPONINI in the last 168 hours. BNP (last 3 results) No results for input(s): PROBNP in the last 8760 hours. HbA1C: No results for input(s): HGBA1C in the last 72 hours. CBG: No results for input(s): GLUCAP in the last 168 hours. Lipid Profile: No results for input(s): CHOL, HDL, LDLCALC, TRIG, CHOLHDL, LDLDIRECT in the last 72 hours. Thyroid Function Tests: No results for input(s): TSH, T4TOTAL, FREET4, T3FREE, THYROIDAB in the last 72 hours. Anemia Panel: Recent Labs    11/03/18 0340 11/04/18 0133  FERRITIN 221 158   Urine analysis:    Component Value Date/Time   COLORURINE YELLOW 04/24/2018 [place] 04/24/2018 1042   LABSPEC 1.008 04/24/2018 1042   PHURINE 5.0 04/24/2018 1042   GLUCOSEU NEGATIVE 04/24/2018  [place] (A) 04/24/2018 [place] 04/24/2018 1042   [place] 04/24/2018 1042   PROTEINUR 30 (A) 04/24/2018 1042   NITRITE NEGATIVE 04/24/2018 1042   LEUKOCYTESUR NEGATIVE 04/24/2018 1042   Sepsis Labs: Invalid input(s): PROCALCITONIN, LACTICIDVEN  Recent Results (from the past 240 hour(s))  SARS Coronavirus 2 (CEPHEID - Performed in [place] hospital lab), Hosp Order     Status: Abnormal   Collection Time: 10/28/18 11:02 PM   Specimen: Nasopharyngeal Swab  Result Value Ref Range Status   SARS Coronavirus 2 POSITIVE (A) NEGATIVE Final    Comment: RESULT CALLED TO, READ BACK BY AND VERIFIED WITH: TCeasar Mons [id number] 10/29/2018 T. TYSOR (NOTE) If result is NEGATIVE SARS-CoV-2 target nucleic acids are NOT DETECTED. The SARS-CoV-2 RNA is generally detectable in upper and lower  respiratory specimens during the acute phase of infection. The lowest  concentration of SARS-CoV-2 viral copies this assay can detect is 250  copies / mL. A negative  result does not preclude SARS-CoV-2 infection  and should not be used as the sole basis for treatment or other  patient management decisions.  A negative result may occur with  improper specimen collection / handling, submission of specimen other  than nasopharyngeal swab, presence of viral mutation(s) within the  areas targeted by this assay, and inadequate number of viral copies  (<250 copies / mL). A negative result must be combined with clinical  observations, patient history, and epidemiological information. If result is POSITIVE SARS-CoV-2 target nucleic acids are DETECTED.  The SARS-CoV-2 RNA is generally detectable in upper and lower  respiratory specimens during the acute phase of infection.  Positive  results are indicative of active infection with SARS-CoV-2.  Clinical  correlation with patient history and other diagnostic information is  necessary to determine patient infection status.  Positive results do  not rule out bacterial infection or co-infection with other viruses. If result is PRESUMPTIVE POSTIVE SARS-CoV-2 nucleic acids MAY BE PRESENT.   A presumptive positive result was obtained on the submitted specimen  and confirmed on repeat testing.  While 2019 novel coronavirus  (SARS-CoV-2) nucleic acids may be present in the submitted sample  additional confirmatory testing may be necessary for epidemiological  and / or clinical management purposes  to differentiate between  SARS-CoV-2 and other Sarbecovirus currently known to infect humans.  If clinically indicated additional testing with an alternate test  methodology [phone])  is advised. The SARS-CoV-2 RNA is generally  detectable in upper and lower respiratory specimens during the acute  phase of infection. The expected result is Negative. Fact Sheet for Patients:  StrictlyIdeas.no Fact Sheet for Healthcare Providers: BankingDealers.co.za This test is not yet  approved or cleared by the [place] FDA and has been authorized for detection and/or diagnosis of SARS-CoV-2 by FDA under an Emergency Use Authorization (EUA).  This EUA will remain in effect (meaning this test can be used) for the duration of the COVID-19 declaration under Section 564(b)(1) of the Act, 21 U.S.C. section 360bbb-3(b)(1), unless the authorization is terminated or revoked sooner. Performed at [facility], [place] [address], [place], [place] [address]   Blood Culture (routine x 2)     Status: None   Collection Time: 10/29/18 12:50 AM   Specimen: BLOOD  Result Value Ref Range Status   Specimen Description BLOOD RIGHT HAND  Final   Special Requests   Final    BOTTLES DRAWN AEROBIC AND ANAEROBIC Blood Culture adequate volume  Culture   Final    NO GROWTH 5 DAYS Performed at [facility], [place] [address], [place], [place] [address]    Report Status 11/03/2018 FINAL  Final  Blood Culture (routine x 2)     Status: None   Collection Time: 10/29/18 12:55 AM   Specimen: BLOOD  Result Value Ref Range Status   Specimen Description BLOOD LEFT HAND  Final   Special Requests   Final    BOTTLES DRAWN AEROBIC AND ANAEROBIC Blood Culture adequate volume   Culture   Final    NO GROWTH 5 DAYS Performed at [facility], [place] [address], [place], [place] [address]    Report Status 11/03/2018 FINAL  Final      Radiology Studies: Ct Angio Chest Pe W Or Wo Contrast  Result Date: 11/03/2018 CLINICAL DATA:  PE suspected EXAM: CT ANGIOGRAPHY CHEST WITH CONTRAST TECHNIQUE: Multidetector CT imaging of the chest was performed using the standard protocol during bolus administration of intravenous contrast. Multiplanar CT image reconstructions and MIPs were obtained to evaluate the vascular anatomy. CONTRAST:  159mL OMNIPAQUE IOHEXOL 350 MG/ML SOLN COMPARISON:  10/29/2018 FINDINGS: Cardiovascular: Satisfactory opacification of the pulmonary arteries to the segmental level.  Positive examination for pulmonary embolism with subsegmental embolus noted in the medial segment of the right lower lobe (series 4, image 110). The RV to LV ratio is elevated at approximately 1.3. Three-vessel coronary artery calcifications. No pericardial effusion. Aortic atherosclerosis. Mediastinum/Nodes: No enlarged mediastinal, hilar, or axillary lymph nodes. Thyroid gland, trachea, and esophagus demonstrate no significant findings. Lungs/Pleura: Diffuse bilateral ground-glass pulmonary opacity, which is more diffuse, although less consolidative and conspicuous in comparison to prior examination. No pleural effusion or pneumothorax. Upper Abdomen: No acute abnormality. Musculoskeletal: No chest wall abnormality. No acute or significant osseous findings. Review of the MIP images confirms the above findings. IMPRESSION: 1. Positive examination for pulmonary embolism with subsegmental embolus noted in the medial segment of the right lower lobe (series 4, image 110). 2. The RV to LV ratio is elevated at approximately 1.3, although given isolated subsegmental embolus this is not likely secondary to embolus induced pulmonary hypertension. 3.  Coronary artery disease. 4. Diffuse bilateral ground-glass pulmonary opacity, which is more diffuse, although less consolidative and conspicuous in comparison to prior examination. Findings remain consistent with evolution of COVID-19. These results were called by telephone at the time of interpretation on 11/03/2018 at 1:28 pm to Dr. Marzetta Board , who verbally acknowledged these results. Electronically Signed   By: Eddie Candle M.D.   On: 11/03/2018 13:31   Dg Chest Port 1 View  Result Date: 11/03/2018 CLINICAL DATA:  Dyspnea. EXAM: PORTABLE CHEST 1 VIEW COMPARISON:  Chest CT, 10/28/2018.  10/29/2018.  Chest radiographs FINDINGS: Mild increase in hazy airspace opacity in the right mid lung. Other areas of hazy airspace opacity in the mid and lower lungs bilaterally are  unchanged. No convincing pleural effusion.  No pneumothorax. IMPRESSION: 1. Slight worsening in lung aeration. Mild increase in hazy airspace opacity in the right mid lung. Other previously seen areas of hazy lung opacity bilaterally are otherwise stable. Electronically Signed   By: Lajean Manes M.D.   On: 11/03/2018 10:13   Marzetta Board, MD, PhD [facility]  Contact via  [email or web address]  [place] P: [phone] F: [phone]

## 2018-11-04 NOTE — Progress Notes (Signed)
Notified spouse of progress.  All questions were answered and this nurse's contact number shared for further communication.   

## 2018-11-04 NOTE — Progress Notes (Signed)
Patient quickly desats to 80% during activity.  Patient currently on 4L HFNC, sats 90-92%.  Patient unable to prone overnight nor lay on either side due to desaturation.  Placed patient in sitting position in the bed, arms elevated on one pillow each.  IS encouraged and cough/deep breathing exercises performed. Patient coughing up pink tinged small thick sputum.

## 2018-11-04 NOTE — Discharge Instructions (Addendum)
Follow with Alex Redwood, MD in 1-2 weeks  Your Losartan, Furosemide and potassium supplements are being held right now. Please follow up with Dr. [name] in 1-2 weeks to monitor your blood pressure and see if you need to resume these medications  Please get a complete blood count and chemistry panel checked by your Primary MD at your next visit, and again as instructed by your Primary MD. Please get your medications reviewed and adjusted by your Primary MD.  Please request your Primary MD to go over all Hospital Tests and Procedure/Radiological results at the follow up, please get all Hospital records sent to your [name] by signing hospital release before you go home.  In some cases, there will be blood work, cultures and biopsy results pending at the time of your discharge. Please request that your primary care M.D. goes through all the records of your hospital data and follows up on these results.  If you had Pneumonia of Lung problems at the Hospital: Please get a 2 view Chest X ray done in 6-8 weeks after hospital discharge or sooner if instructed by your Primary MD.  If you have Congestive Heart Failure: Please call your Cardiologist or Primary MD anytime you have any of the following symptoms:  1) 3 pound weight gain in 24 hours or 5 pounds in 1 week  2) shortness of breath, with or without a dry hacking cough  3) swelling in the hands, feet or stomach  4) if you have to sleep on extra pillows at night in order to breathe  Follow cardiac low salt diet and 1.5 lit/day fluid restriction.  If you have diabetes Accuchecks 4 times/day, Once in AM empty stomach and then before each meal. Log in all results and show them to your primary doctor at your next visit. If any glucose reading is under 80 or above 300 call your primary MD immediately.  If you have Seizure/Convulsions/Epilepsy: Please do not drive, operate heavy machinery, participate in activities at heights or participate in high  speed sports until you have seen by Primary MD or a Neurologist and advised to do so again. Per [facility] statutes, patients with seizures are not allowed to drive until they have been seizure-free for six months.  Use caution when using heavy equipment or power tools. Avoid working on ladders or at heights. Take showers instead of baths. Ensure the water temperature is not too high on the home water heater. Do not go swimming alone. Do not lock yourself in a room alone (i.e. bathroom). When caring for infants or small children, sit down when holding, feeding, or changing them to minimize risk of injury to the child in the event you have a seizure. Maintain good sleep hygiene. Avoid alcohol.   If you had Gastrointestinal Bleeding: Please ask your Primary MD to check a complete blood count within one week of discharge or at your next visit. Your endoscopic/colonoscopic biopsies that are pending at the time of discharge, will also need to followed by your Primary MD.  Get Medicines reviewed and adjusted. Please take all your medications with you for your next visit with your Primary MD  Please request your Primary MD to go over all hospital tests and procedure/radiological results at the follow up, please ask your Primary MD to get all Hospital records sent to his/her office.  If you experience worsening of your admission symptoms, develop shortness of breath, life threatening emergency, suicidal or homicidal thoughts you must seek medical  attention immediately by calling 911 or calling your MD immediately  if symptoms less severe.  You must read complete instructions/literature along with all the possible adverse reactions/side effects for all the Medicines you take and that have been prescribed to you. Take any new Medicines after you have completely understood and accpet all the possible adverse reactions/side effects.   Do not drive or operate heavy machinery when taking Pain medications.    Do not take more than prescribed Pain, Sleep and Anxiety Medications  Special Instructions: If you have smoked or chewed Tobacco  in the last 2 yrs please stop smoking, stop any regular Alcohol  and or any Recreational drug use.  Wear Seat belts while driving.  Please note You were cared for by a hospitalist during your hospital stay. If you have any questions about your discharge medications or the care you received while you were in the hospital after you are discharged, you can call the unit and asked to speak with the hospitalist on call if the hospitalist that took care of you is not available. Once you are discharged, your primary care physician will handle any further medical issues. Please note that NO REFILLS for any discharge medications will be authorized once you are discharged, as it is imperative that you return to your primary care physician (or establish a relationship with a primary care physician if you do not have one) for your aftercare needs so that they can reassess your need for medications and monitor your lab values.  You can reach the hospitalist office at phone [phone] or fax [phone]   If you do not have a primary care physician, you can call [phone] for a physician referral.  Activity: As tolerated with Full fall precautions use walker/cane & assistance as needed    Diet: regular  Disposition Home     Information on my medicine - ELIQUIS (apixaban)  This medication education was reviewed with me or my healthcare representative as part of my discharge preparation.  The pharmacist that spoke with me during my hospital stay was:  Alex [name], [facility]  Why was Eliquis prescribed for you? Eliquis was prescribed to treat blood clots that may have been found in the veins of your legs (deep vein thrombosis) or in your lungs (pulmonary embolism) and to reduce the risk of them occurring again.  What do You need to know about Eliquis ? The  starting dose is 10 mg (two 5 mg tablets) taken TWICE daily for the FIRST SEVEN (7) DAYS, then on (enter date)  11/11/2018  the dose is reduced to ONE 5 mg tablet taken TWICE daily.  Eliquis may be taken with or without food.   Try to take the dose about the same time in the morning and in the evening. If you have difficulty swallowing the tablet whole please discuss with your pharmacist how to take the medication safely.  Take Eliquis exactly as prescribed and DO NOT stop taking Eliquis without talking to the doctor who prescribed the medication.  Stopping may increase your risk of developing a new blood clot.  Refill your prescription before you run out.  After discharge, you should have regular check-up appointments with your healthcare provider that is prescribing your Eliquis.    What do you do if you miss a dose? If a dose of ELIQUIS is not taken at the scheduled time, take it as soon as possible on the same day and twice-daily administration should be resumed. The dose should  not be doubled to make up for a missed dose.  Important Safety Information A possible side effect of Eliquis is bleeding. You should call your healthcare provider right away if you experience any of the following: ? Bleeding from an injury or your nose that does not stop. ? Unusual colored urine (red or dark brown) or unusual colored stools (red or black). ? Unusual bruising for unknown reasons. ? A serious fall or if you hit your head (even if there is no bleeding).  Some medicines may interact with Eliquis and might increase your risk of bleeding or clotting while on Eliquis. To help avoid this, consult your healthcare provider or pharmacist prior to using any new prescription or non-prescription medications, including herbals, vitamins, non-steroidal anti-inflammatory drugs (NSAIDs) and supplements.  This website has more information on Eliquis (apixaban): http://www.eliquis.com/eliquis/home    COVID-19  Frequently Asked Questions COVID-19 (coronavirus disease) is an infection that is caused by a large family of viruses. Some viruses cause illness in people and others cause illness in animals like camels, cats, and bats. In some cases, the viruses that cause illness in animals can spread to humans. Where did the coronavirus come from? In December 2019, [place] told the [facility]) of several cases of lung disease (human respiratory illness). These cases were linked to an open seafood and livestock market in the city of [place]. The link to the seafood and livestock market suggests that the virus may have spread from animals to humans. However, since that first outbreak in December, the virus has also been shown to spread from person to person. What is the name of the disease and the virus? Disease name Early on, this disease was called novel coronavirus. This is because scientists determined that the disease was caused by a new (novel) respiratory virus. The World Health Organization [facility]) has now named the disease COVID-19, or coronavirus disease. Virus name The virus that causes the disease is called severe acute respiratory syndrome coronavirus 2 (SARS-CoV-2). More information on disease and virus naming World Health Organization [facility]): www.who.int/emergencies/diseases/novel-coronavirus-2019/technical-guidance/naming-the-coronavirus-disease-(covid-2019)-and-the-virus-that-causes-it Who is at risk for complications from coronavirus disease? Some people may be at higher risk for complications from coronavirus disease. This includes older adults and people who have chronic diseases, such as heart disease, diabetes, and lung disease. If you are at higher risk for complications, take these extra precautions:  Avoid close contact with people who are sick or have a fever or cough. Stay at least 3-6 ft (1-2 m) away from them, if possible.  Wash your hands often with soap and water for at  least 20 seconds.  Avoid touching your face, mouth, nose, or eyes.  Keep supplies on hand at home, such as food, medicine, and cleaning supplies.  Stay home as much as possible.  Avoid social gatherings and travel. How does coronavirus disease spread? The virus that causes coronavirus disease spreads easily from person to person (is contagious). There are also cases of community-spread disease. This means the disease has spread to:  People who have no known contact with other infected people.  People who have not traveled to areas where there are known cases. It appears to spread from one person to another through droplets from coughing or sneezing. Can I get the virus from touching surfaces or objects? There is still a lot that we do not know about the virus that causes coronavirus disease. Scientists are basing a lot of information on what they know about similar viruses, such as:  Viruses cannot generally survive on surfaces for long. They need a human body (host) to survive.  It is more likely that the virus is spread by close contact with people who are sick (direct contact), such as through: ? Shaking hands or hugging. ? Breathing in respiratory droplets that travel through the air. This can happen when an infected person coughs or sneezes on or near other people.  It is less likely that the virus is spread when a person touches a surface or object that has the virus on it (indirect contact). The virus may be able to enter the body if the person touches a surface or object and then touches his or her face, eyes, nose, or mouth. Can a person spread the virus without having symptoms of the disease? It may be possible for the virus to spread before a person has symptoms of the disease, but this is most likely not the main way the virus is spreading. It is more likely for the virus to spread by being in close contact with people who are sick and breathing in the respiratory droplets of a  sick person's cough or sneeze. What are the symptoms of coronavirus disease? Symptoms vary from person to person and can range from mild to severe. Symptoms may include:  Fever.  Cough.  Tiredness, weakness, or fatigue.  Fast breathing or feeling short of breath. These symptoms can appear anywhere from 2 to 14 days after you have been exposed to the virus. If you develop symptoms, call your health care provider. People with severe symptoms may need hospital care. If I am exposed to the virus, how long does it take before symptoms start? Symptoms of coronavirus disease may appear anywhere from 2 to 14 days after a person has been exposed to the virus. If you develop symptoms, call your health care provider. Should I be tested for this virus? Your health care provider will decide whether to test you based on your symptoms, history of exposure, and your risk factors. How does a health care provider test for this virus? Health care providers will collect samples to send for testing. Samples may include:  Taking a swab of fluid from the nose.  Taking fluid from the lungs by having you cough up mucus (sputum) into a sterile cup.  Taking a blood sample.  Taking a stool or urine sample. Is there a treatment or vaccine for this virus? Currently, there is no vaccine to prevent coronavirus disease. Also, there are no medicines like antibiotics or antivirals to treat the virus. A person who becomes sick is given supportive care, which means rest and fluids. A person may also relieve his or her symptoms by using over-the-counter medicines that treat sneezing, coughing, and runny nose. These are the same medicines that a person takes for the common cold. If you develop symptoms, call your health care provider. People with severe symptoms may need hospital care. What can I do to protect myself and my family from this virus?     You can protect yourself and your family by taking the same actions  that you would take to prevent the spread of other viruses. Take the following actions:  Wash your hands often with soap and water for at least 20 seconds. If soap and water are not available, use alcohol-based hand sanitizer.  Avoid touching your face, mouth, nose, or eyes.  Cough or sneeze into a tissue, sleeve, or elbow. Do not cough or sneeze into your hand or  the air. ? If you cough or sneeze into a tissue, throw it away immediately and wash your hands.  Disinfect objects and surfaces that you frequently touch every day.  Avoid close contact with people who are sick or have a fever or cough. Stay at least 3-6 ft (1-2 m) away from them, if possible.  Stay home if you are sick, except to get medical care. Call your health care provider before you get medical care.  Make sure your vaccines are up to date. Ask your health care provider what vaccines you need. What should I do if I need to travel? Follow travel recommendations from your local health authority, the CDC, and WHO. Travel information and advice  Centers for Disease Control and Prevention (CDC): BodyEditor.hu  World Health Organization [facility]): ThirdIncome.ca Know the risks and take action to protect your health  You are at higher risk of getting coronavirus disease if you are traveling to areas with an outbreak or if you are exposed to travelers from areas with an outbreak.  Wash your hands often and practice good hygiene to lower the risk of catching or spreading the virus. What should I do if I am sick? General instructions to stop the spread of infection  Wash your hands often with soap and water for at least 20 seconds. If soap and water are not available, use alcohol-based hand sanitizer.  Cough or sneeze into a tissue, sleeve, or elbow. Do not cough or sneeze into your hand or the air.  If you cough or sneeze into a  tissue, throw it away immediately and wash your hands.  Stay home unless you must get medical care. Call your health care provider or local health authority before you get medical care.  Avoid public areas. Do not take public transportation, if possible.  If you can, wear a mask if you must go out of the house or if you are in close contact with someone who is not sick. Keep your home clean  Disinfect objects and surfaces that are frequently touched every day. This may include: ? Counters and tables. ? Doorknobs and light switches. ? Sinks and faucets. ? Electronics such as phones, remote controls, keyboards, computers, and tablets.  Wash dishes in hot, soapy water or use a dishwasher. Air-dry your dishes.  Wash laundry in hot water. Prevent infecting other household members  Let healthy household members care for children and pets, if possible. If you have to care for children or pets, wash your hands often and wear a mask.  Sleep in a different bedroom or bed, if possible.  Do not share personal items, such as razors, toothbrushes, deodorant, combs, brushes, towels, and washcloths. Where to find more information Centers for Disease Control and Prevention (CDC)  Information and news updates: https://www.butler-gonzalez.com/ World Health Organization [employer])  Information and news updates: MissExecutive.com.ee  Coronavirus health topic: https://www.castaneda.info/  Questions and answers on COVID-19: OpportunityDebt.at  Global tracker: who.sprinklr.com American Academy of Pediatrics (AAP)  Information for families: www.healthychildren.org/English/health-issues/conditions/chest-lungs/Pages/2019-Novel-Coronavirus.aspx The coronavirus situation is changing rapidly. Check your local health authority website or the CDC and [facility] websites for updates and news. When should I contact a health care  provider?  Contact your health care provider if you have symptoms of an infection, such as fever or cough, and you: ? Have been near anyone who is known to have coronavirus disease. ? Have come into contact with a person who is suspected to have coronavirus disease. ? Have traveled outside of the country.  When should I get emergency medical care?  Get help right away by calling your local emergency services (911 in the [place] if you have: ? Trouble breathing. ? Pain or pressure in your chest. ? Confusion. ? Blue-tinged lips and fingernails. ? Difficulty waking from sleep. ? Symptoms that get worse. Let the emergency medical personnel know if you think you have coronavirus disease. Summary  A new respiratory virus is spreading from person to person and causing COVID-19 (coronavirus disease).  The virus that causes COVID-19 appears to spread easily. It spreads from one person to another through droplets from coughing or sneezing.  Older adults and those with chronic diseases are at higher risk of disease. If you are at higher risk for complications, take extra precautions.  There is currently no vaccine to prevent coronavirus disease. There are no medicines, such as antibiotics or antivirals, to treat the virus.  You can protect yourself and your family by washing your hands often, avoiding touching your face, and covering your coughs and sneezes. This information is not intended to replace advice given to you by your health care provider. Make sure you discuss any questions you have with your health care provider. Document Released: 08/07/2018 Document Revised: 08/07/2018 Document Reviewed: 08/07/2018 Elsevier Patient Education  2020 [employer].   COVID-19 COVID-19 is a respiratory infection that is caused by a virus called severe acute respiratory syndrome coronavirus 2 (SARS-CoV-2). The disease is also known as coronavirus disease or novel coronavirus. In some people, the virus  may not cause any symptoms. In others, it may cause a serious infection. The infection can get worse quickly and can lead to complications, such as:  Pneumonia, or infection of the lungs.  Acute respiratory distress syndrome or ARDS. This is fluid build-up in the lungs.  Acute respiratory failure. This is a condition in which there is not enough oxygen passing from the lungs to the body.  Sepsis or septic shock. This is a serious bodily reaction to an infection.  Blood clotting problems.  Secondary infections due to bacteria or fungus. The virus that causes COVID-19 is contagious. This means that it can spread from person to person through droplets from coughs and sneezes (respiratory secretions). What are the causes? This illness is caused by a virus. You may catch the virus by:  Breathing in droplets from an infected person's cough or sneeze.  Touching something, like a table or a doorknob, that was exposed to the virus (contaminated) and then touching your mouth, nose, or eyes. What increases the risk? Risk for infection You are more likely to be infected with this virus if you:  Live in or travel to an area with a COVID-19 outbreak.  Come in contact with a sick person who recently traveled to an area with a COVID-19 outbreak.  Provide care for or live with a person who is infected with COVID-19. Risk for serious illness You are more likely to become seriously ill from the virus if you:  Are 77 years of age or older.  Have a long-term disease that lowers your body's ability to fight infection (immunocompromised).  Live in a nursing home or long-term care facility.  Have a long-term (chronic) disease such as: ? Chronic lung disease, including chronic obstructive pulmonary disease or asthma ? Heart disease. ? Diabetes. ? Chronic kidney disease. ? Liver disease.  Are obese. What are the signs or symptoms? Symptoms of this condition can range from mild to severe.  Symptoms may appear any time  from 2 to 14 days after being exposed to the virus. They include:  A fever.  A cough.  Difficulty breathing.  Chills.  Muscle pains.  A sore throat.  Loss of taste or smell. Some people may also have stomach problems, such as nausea, vomiting, or diarrhea. Other people may not have any symptoms of COVID-19. How is this diagnosed? This condition may be diagnosed based on:  Your signs and symptoms, especially if: ? You live in an area with a COVID-19 outbreak. ? You recently traveled to or from an area where the virus is common. ? You provide care for or live with a person who was diagnosed with COVID-19.  A physical exam.  Lab tests, which may include: ? A nasal swab to take a sample of fluid from your nose. ? A throat swab to take a sample of fluid from your throat. ? A sample of mucus from your lungs (sputum). ? Blood tests.  Imaging tests, which may include, X-rays, CT scan, or ultrasound. How is this treated? At present, there is no medicine to treat COVID-19. Medicines that treat other diseases are being used on a trial basis to see if they are effective against COVID-19. Your health care provider will talk with you about ways to treat your symptoms. For most people, the infection is mild and can be managed at home with rest, fluids, and over-the-counter medicines. Treatment for a serious infection usually takes places in a hospital intensive care unit (ICU). It may include one or more of the following treatments. These treatments are given until your symptoms improve.  Receiving fluids and medicines through an IV.  Supplemental oxygen. Extra oxygen is given through a tube in the nose, a face mask, or a hood.  Positioning you to lie on your stomach (prone position). This makes it easier for oxygen to get into the lungs.  Continuous positive airway pressure (CPAP) or bi-level positive airway pressure (BPAP) machine. This treatment uses mild  air pressure to keep the airways open. A tube that is connected to a motor delivers oxygen to the body.  Ventilator. This treatment moves air into and out of the lungs by using a tube that is placed in your windpipe.  Tracheostomy. This is a procedure to create a hole in the neck so that a breathing tube can be inserted.  Extracorporeal membrane oxygenation (ECMO). This procedure gives the lungs a chance to recover by taking over the functions of the heart and lungs. It supplies oxygen to the body and removes carbon dioxide. Follow these instructions at home: Lifestyle  If you are sick, stay home except to get medical care. Your health care provider will tell you how long to stay home. Call your health care provider before you go for medical care.  Rest at home as told by your health care provider.  Do not use any products that contain nicotine or tobacco, such as cigarettes, e-cigarettes, and chewing tobacco. If you need help quitting, ask your health care provider.  Return to your normal activities as told by your health care provider. Ask your health care provider what activities are safe for you. General instructions  Take over-the-counter and prescription medicines only as told by your health care provider.  Drink enough fluid to keep your urine pale yellow.  Keep all follow-up visits as told by your health care provider. This is important. How is this prevented?  There is no vaccine to help prevent COVID-19 infection. However, there are steps  you can take to protect yourself and others from this virus. To protect yourself:   Do not travel to areas where COVID-19 is a risk. The areas where COVID-19 is reported change often. To identify high-risk areas and travel restrictions, check the CDC travel website: FatFares.com.br  If you live in, or must travel to, an area where COVID-19 is a risk, take precautions to avoid infection. ? Stay away from people who are  sick. ? Wash your hands often with soap and water for 20 seconds. If soap and water are not available, use an alcohol-based hand sanitizer. ? Avoid touching your mouth, face, eyes, or nose. ? Avoid going out in public, follow guidance from your state and local health authorities. ? If you must go out in public, wear a cloth face covering or face mask. ? Disinfect objects and surfaces that are frequently touched every day. This may include:  Counters and tables.  Doorknobs and light switches.  Sinks and faucets.  Electronics, such as phones, remote controls, keyboards, computers, and tablets. To protect others: If you have symptoms of COVID-19, take steps to prevent the virus from spreading to others.  If you think you have a COVID-19 infection, contact your health care provider right away. Tell your health care team that you think you may have a COVID-19 infection.  Stay home. Leave your house only to seek medical care. Do not use public transport.  Do not travel while you are sick.  Wash your hands often with soap and water for 20 seconds. If soap and water are not available, use alcohol-based hand sanitizer.  Stay away from other members of your household. Let healthy household members care for children and pets, if possible. If you have to care for children or pets, wash your hands often and wear a mask. If possible, stay in your own room, separate from others. Use a different bathroom.  Make sure that all people in your household wash their hands well and often.  Cough or sneeze into a tissue or your sleeve or elbow. Do not cough or sneeze into your hand or into the air.  Wear a cloth face covering or face mask. Where to find more information  Centers for Disease Control and Prevention: PurpleGadgets.be  World Health Organization: https://www.castaneda.info/ Contact a health care provider if:  You live in or have traveled to an area  where COVID-19 is a risk and you have symptoms of the infection.  You have had contact with someone who has COVID-19 and you have symptoms of the infection. Get help right away if:  You have trouble breathing.  You have pain or pressure in your chest.  You have confusion.  You have bluish lips and fingernails.  You have difficulty waking from sleep.  You have symptoms that get worse. These symptoms may represent a serious problem that is an emergency. Do not wait to see if the symptoms will go away. Get medical help right away. Call your local emergency services (911 in the [place] Do not drive yourself to the hospital. Let the emergency medical personnel know if you think you have COVID-19. Summary  COVID-19 is a respiratory infection that is caused by a virus. It is also known as coronavirus disease or novel coronavirus. It can cause serious infections, such as pneumonia, acute respiratory distress syndrome, acute respiratory failure, or sepsis.  The virus that causes COVID-19 is contagious. This means that it can spread from person to person through droplets from coughs and  sneezes.  You are more likely to develop a serious illness if you are 56 years of age or older, have a weak immunity, live in a nursing home, or have chronic disease.  There is no medicine to treat COVID-19. Your health care provider will talk with you about ways to treat your symptoms.  Take steps to protect yourself and others from infection. Wash your hands often and disinfect objects and surfaces that are frequently touched every day. Stay away from people who are sick and wear a mask if you are sick. This information is not intended to replace advice given to you by your health care provider. Make sure you discuss any questions you have with your health care provider. Document Released: 05/17/2018 Document Revised: 09/06/2018 Document Reviewed: 05/17/2018 Elsevier Patient Education  2020 [place]: How to Protect Yourself and Others Know how it spreads  There is currently no vaccine to prevent coronavirus disease 2019 (COVID-19).  The best way to prevent illness is to avoid being exposed to this virus.  The virus is thought to spread mainly from person-to-person. ? Between people who are in close contact with one another (within about 6 feet). ? Through respiratory droplets produced when an infected person coughs, sneezes or talks. ? These droplets can land in the mouths or noses of people who are nearby or possibly be inhaled into the lungs. ? Some recent studies have suggested that COVID-19 may be spread by people who are not showing symptoms. Everyone should Clean your hands often  Wash your hands often with soap and water for at least 20 seconds especially after you have been in a public place, or after blowing your nose, coughing, or sneezing.  If soap and water are not readily available, use a hand sanitizer that contains at least 60% alcohol. Cover all surfaces of your hands and rub them together until they feel dry.  Avoid touching your eyes, nose, and mouth with unwashed hands. Avoid close contact  Stay home if you are sick.  Avoid close contact with people who are sick.  Put distance between yourself and other people. ? Remember that some people without symptoms may be able to spread virus. ? This is especially important for people who are at higher risk of getting very GainPain.com.cy Cover your mouth and nose with a cloth face cover when around others  You could spread COVID-19 to others even if you do not feel sick.  Everyone should wear a cloth face cover when they have to go out in public, for example to the grocery store or to pick up other necessities. ? Cloth face coverings should not be placed on young children under age 58, anyone who has trouble breathing, or is  unconscious, incapacitated or otherwise unable to remove the mask without assistance.  The cloth face cover is meant to protect other people in case you are infected.  Do NOT use a facemask meant for a Dietitian.  Continue to keep about 6 feet between yourself and others. The cloth face cover is not a substitute for social distancing. Cover coughs and sneezes  If you are in a private setting and do not have on your cloth face covering, remember to always cover your mouth and nose with a tissue when you cough or sneeze or use the inside of your elbow.  Throw used tissues in the trash.  Immediately wash your hands with soap and water for at least 20 seconds. If soap and  water are not readily available, clean your hands with a hand sanitizer that contains at least 60% alcohol. Clean and disinfect  Clean AND disinfect frequently touched surfaces daily. This includes tables, doorknobs, light switches, countertops, handles, desks, phones, keyboards, toilets, faucets, and sinks. RackRewards.fr  If surfaces are dirty, clean them: Use detergent or soap and water prior to disinfection.  Then, use a household disinfectant. You can see a list of EPA-registered household disinfectants here. michellinders.com 08/28/2018 This information is not intended to replace advice given to you by your health care provider. Make sure you discuss any questions you have with your health care provider. Document Released: 08/07/2018 Document Revised: 09/05/2018 Document Reviewed: 08/07/2018 Elsevier Patient Education  [place].

## 2018-11-04 NOTE — Progress Notes (Signed)
[facility] for Heparin > apixaban  Indication: pulmonary embolus  Allergies  Allergen Reactions  . Ramipril Cough    Patient Measurements: Height: 5\' 10"  (177.8 cm) Weight: 203 lb 4.2 oz (92.2 kg) IBW/kg (Calculated) : 73 Heparin Dosing Weight: 92 kg  Vital Signs: Temp: 97.8 F (36.6 C) (07/12 0753) Temp Source: Oral (07/12 0753) BP: 140/81 (07/12 0753) Pulse Rate: 62 (07/12 0407)  Labs: Recent Labs    11/02/18 0430  11/02/18 1340 11/03/18 0340 11/03/18 1500 11/03/18 2230 11/04/18 0133  HGB  --    < > 12.5* 12.3*  --   --  11.6*  HCT  --   --  37.2* 37.6*  --   --  35.2*  PLT  --   --  133* 115*  --   --  113*  APTT  --   --   --   --  27  --   --   LABPROT  --   --   --   --  15.5*  --   --   INR  --   --   --   --  1.2  --   --   HEPARINUNFRC  --   --   --   --   --  0.81*  --   CREATININE 1.06  --   --  1.15  --   --  1.19   < > = values in this interval not displayed.    Estimated Creatinine Clearance: 61.2 mL/min (by C-G formula based on SCr of 1.19 mg/dL).   Medical History: Past Medical History:  Diagnosis Date  . Amyloidosis ([place])   . Arthritis   . Basal cell carcinoma   . CAD (coronary artery disease)    Lexiscan Myoview 6/14: Normal study, no scar or ischemia, EF 62%  . Cataract   . Chronic kidney disease   . Colon polyp   . Coronary artery disease    moderate disease by cath 2011  . Diverticulosis of colon   . GERD (gastroesophageal reflux disease)   . Hearing loss   . Heart disease   . Hiatal hernia   . Hyperlipidemia   . Hypertension   . MRSA (methicillin resistant staph aureus) culture positive   . Seizure disorder ([place])   . Seizures ([place])    last in 1970s    Assessment: 78 y/o M with COVID PNA on Lovenox 40 mg for DVT ppx with last dose this AM. MD initiated heparin infusion due to new PE. Patient was on immunosuppressive therapy PTA and has chronic thrombocytopenia.   Now transitioning  patient to apixaban. H/H down, Plt low at 113. SCr 1.19.   Goal of Therapy:  PE treatment Monitor platelets by anticoagulation protocol: Yes   Plan:  -Stop IV heparin -Start apixaban 10 mg twice daily x 7 days, then transition to apiaxban 5 mg twice daily  -Monitor for s/s of bleeding    Albertina Parr, PharmD., BCPS Clinical Pharmacist Clinical phone for 11/04/18 until 5pm: [phone]

## 2018-11-04 NOTE — Progress Notes (Signed)
[facility] for Heparin  Indication: pulmonary embolus  Allergies  Allergen Reactions  . Ramipril Cough    Patient Measurements: Height: 5\' 10"  (177.8 cm) Weight: 203 lb 4.2 oz (92.2 kg) IBW/kg (Calculated) : 73 Heparin Dosing Weight: 92 kg  Vital Signs: Temp: 99.2 F (37.3 C) (07/11 2346) Temp Source: Oral (07/11 2346) BP: 101/67 (07/11 2346) Pulse Rate: 64 (07/11 2346)  Labs: Recent Labs    11/01/18 0500 11/02/18 0430 11/02/18 1340 11/03/18 0340 11/03/18 1500 11/03/18 2230  HGB  --   --  12.5* 12.3*  --   --   HCT  --   --  37.2* 37.6*  --   --   PLT  --   --  133* 115*  --   --   APTT  --   --   --   --  27  --   LABPROT  --   --   --   --  15.5*  --   INR  --   --   --   --  1.2  --   HEPARINUNFRC  --   --   --   --   --  0.81*  CREATININE 1.21 1.06  --  1.15  --   --     Estimated Creatinine Clearance: 63.4 mL/min (by C-G formula based on SCr of 1.15 mg/dL).   Medical History: Past Medical History:  Diagnosis Date  . Amyloidosis ([place])   . Arthritis   . Basal cell carcinoma   . CAD (coronary artery disease)    Lexiscan Myoview 6/14: Normal study, no scar or ischemia, EF 62%  . Cataract   . Chronic kidney disease   . Colon polyp   . Coronary artery disease    moderate disease by cath 2011  . Diverticulosis of colon   . GERD (gastroesophageal reflux disease)   . Hearing loss   . Heart disease   . Hiatal hernia   . Hyperlipidemia   . Hypertension   . MRSA (methicillin resistant staph aureus) culture positive   . Seizure disorder ([place])   . Seizures ([place])    last in 1970s    Assessment: 78 y/o M with COVID PNA on Lovenox 40 mg for DVT ppx with last dose this AM. MD initiating heparin infusion due to new PE. Patient was on immunosuppressive therapy PTA and has chronic thrombocytopenia.   7/11 PM update: - HL is 0.81 supratherapeutic - No line or bleeding issues per RN   Goal of Therapy:  Heparin level  0.3-0.7 units/ml Monitor platelets by anticoagulation protocol: Yes   Plan:    Decrease heparin infusion to 1400 units/hr  Check HL in 8 hours after rate change  and daily while on heparin  Continue to monitor H&H and platelets (close monitoring of platelets due to thrombocytopenia)   Royetta Asal, PharmD, BCPS 11/04/2018 12:38 AM

## 2018-11-04 NOTE — Progress Notes (Signed)
End of shift summary:  Pt still SOB with minimal activity.  Ambulated in room from bed to door x 2.  Pt desaturated to 78% on 15L with ambulation.  Took approximately 5 minutes to recover.  Incentive spirometer and deep breathing/cough encouraged.  Spouse updated.  Will continue to monitor closely.

## 2018-11-05 LAB — CBC WITH DIFFERENTIAL/PLATELET
Abs Immature Granulocytes: 0.31 10*3/uL — ABNORMAL HIGH (ref 0.00–0.07)
Basophils Absolute: 0 10*3/uL (ref 0.0–0.1)
Basophils Relative: 0 %
Eosinophils Absolute: 0 10*3/uL (ref 0.0–0.5)
Eosinophils Relative: 0 %
HCT: 36.5 % — ABNORMAL LOW (ref 39.0–52.0)
Hemoglobin: 11.9 g/dL — ABNORMAL LOW (ref 13.0–17.0)
Immature Granulocytes: 3 %
Lymphocytes Relative: 5 %
Lymphs Abs: 0.7 10*3/uL (ref 0.7–4.0)
MCH: 30.2 pg (ref 26.0–34.0)
MCHC: 32.6 g/dL (ref 30.0–36.0)
MCV: 92.6 fL (ref 80.0–100.0)
Monocytes Absolute: 0.4 10*3/uL (ref 0.1–1.0)
Monocytes Relative: 3 %
Neutro Abs: 10.8 10*3/uL — ABNORMAL HIGH (ref 1.7–7.7)
Neutrophils Relative %: 89 %
Platelets: 116 10*3/uL — ABNORMAL LOW (ref 150–400)
RBC: 3.94 MIL/uL — ABNORMAL LOW (ref 4.22–5.81)
RDW: 14.9 % (ref 11.5–15.5)
WBC: 12.1 10*3/uL — ABNORMAL HIGH (ref 4.0–10.5)
nRBC: 0 % (ref 0.0–0.2)

## 2018-11-05 LAB — COMPREHENSIVE METABOLIC PANEL
ALT: 100 U/L — ABNORMAL HIGH (ref 0–44)
AST: 61 U/L — ABNORMAL HIGH (ref 15–41)
Albumin: 2.6 g/dL — ABNORMAL LOW (ref 3.5–5.0)
Alkaline Phosphatase: 78 U/L (ref 38–126)
Anion gap: 11 (ref 5–15)
BUN: 51 mg/dL — ABNORMAL HIGH (ref 8–23)
CO2: 23 mmol/L (ref 22–32)
Calcium: 8.1 mg/dL — ABNORMAL LOW (ref 8.9–10.3)
Chloride: 105 mmol/L (ref 98–111)
Creatinine, Ser: 1.18 mg/dL (ref 0.61–1.24)
GFR calc Af Amer: >60 mL/min (ref >60)
GFR calc non Af Amer: >60 mL/min (ref >60)
Glucose, Bld: 197 mg/dL — ABNORMAL HIGH (ref 70–99)
Potassium: 4.6 mmol/L (ref 3.5–5.1)
Sodium: 139 mmol/L (ref 135–145)
Total Bilirubin: 0.8 mg/dL (ref 0.3–1.2)
Total Protein: 5.6 g/dL — ABNORMAL LOW (ref 6.5–8.1)

## 2018-11-05 LAB — FERRITIN: Ferritin: 222 ng/mL (ref 24–336)

## 2018-11-05 LAB — D-DIMER, QUANTITATIVE: D-Dimer, Quant: 3.24 ug/mL-FEU — ABNORMAL HIGH (ref 0.00–0.50)

## 2018-11-05 LAB — GLUCOSE, CAPILLARY
Glucose-Capillary: 208 mg/dL — ABNORMAL HIGH (ref 70–99)
Glucose-Capillary: 163 mg/dL — ABNORMAL HIGH (ref 70–99)

## 2018-11-05 LAB — LACTATE DEHYDROGENASE: LDH: 386 U/L — ABNORMAL HIGH (ref 98–192)

## 2018-11-05 LAB — C-REACTIVE PROTEIN: CRP: <0.8 mg/dL (ref <1.0)

## 2018-11-05 MED ORDER — DEXAMETHASONE 6 MG PO TABS
6.0000 mg | ORAL_TABLET | Freq: Every day | ORAL | Status: AC
  Administered 2018-11-06 – 2018-11-07 (×2): 6 mg via ORAL
  Filled 2018-11-05 (×2): qty 1

## 2018-11-05 MED ORDER — INSULIN ASPART 100 UNIT/ML ~~LOC~~ SOLN
0.0000 [IU] | Freq: Three times a day (TID) | SUBCUTANEOUS | Status: DC
  Administered 2018-11-05: 3 [IU] via SUBCUTANEOUS
  Administered 2018-11-06 (×3): 2 [IU] via SUBCUTANEOUS
  Administered 2018-11-07: 3 [IU] via SUBCUTANEOUS
  Administered 2018-11-07: 2 [IU] via SUBCUTANEOUS

## 2018-11-05 MED ORDER — METHYLPREDNISOLONE SODIUM SUCC 125 MG IJ SOLR
60.0000 mg | Freq: Two times a day (BID) | INTRAMUSCULAR | Status: AC
  Administered 2018-11-05: 18:00:00 60 mg via INTRAVENOUS
  Filled 2018-11-05: qty 2

## 2018-11-05 NOTE — Progress Notes (Signed)
PROGRESS NOTE  Alex [name]. ZOX:[id number] DOB: 1940-08-01 DOA: 10/28/2018 PCP: [name], MD   LOS: 7 days   Brief Narrative / Interim history: 78 year old male with history of amyloidosis, nephrotic syndrome, on immunosuppressive therapy and steroids, hypertension, coronary artery disease, moderate left ear who came to the ED and was admitted to the hospital on 10/28/2018 with shortness of breath, was diagnosed with COVID-19 pneumonia and was admitted to the hospital.  Subjective: His breathing is stable this morning.  He denies any chest pain, denies any significant shortness of breath at rest but did feel short winded when he ambulated last night.  No abdominal pain, no nausea or vomiting.  Assessment & Plan: Principal Problem:   Pneumonia due to COVID-19 virus Active Problems:   Essential hypertension   AL amyloidosis (HCC)   Acute respiratory failure with hypoxia (HCC)   Thrombocytopenia (HCC)   Principal Problem Acute Hypoxic Respiratory Failure due to Covid-19 Viral Illness -Patient was placed on IV steroids on admission and completed a course of Remdesivir with last day on 7/10.  His respiratory status worsened 7/10-7/11, and received Actemra as well as convalescent plasma on 7/11.  Actemra treatment was discussed with patient's primary oncologist Dr. [name] -Slow to improve -Also placed on anticoagulation for PE as discussed below  COVID-19 Labs  Recent Labs    11/03/18 0340 11/04/18 0133 11/05/18 0150  DDIMER 6.10* 7.76* 3.24*  FERRITIN 221 158 222  LDH 307* 310* 386*  CRP 0.8 <0.8 <0.8    Lab Results  Component Value Date   SARSCOV2NAA POSITIVE (A) 10/28/2018    Active Problems Acute PE -Due to worsening hypoxia and slight increase in the d-dimer underwent a CT angiogram on 7/11.  CT scan was positive for PE with subsegmental embolus in the medial segment of the right lower lobe, discussed with radiology over the phone and unlikely to explain his  degree of hypoxia.  CT scan also showed diffuse bilateral groundglass pulmonary opacities consistent with COVID-19 evolution -He was placed on heparin infusion, has remained stable, no bleeding, was transitioned to Eliquis and she seemed to be tolerating this well  AL amyloidosis -Follows with oncology as an outpatient -Continue steroids and home medications  Chronic thrombocytopenia -Platelets are stable today  Hypertension -Continue home medications, hold Lasix  Hyperlipidemia -Continue atorvastatin, Zetia  Coronary artery disease -No chest pain, stable  PVCs on monitor -Asymptomatic, stable  Scheduled Meds:  acyclovir  400 mg Oral BID   apixaban  10 mg Oral BID   Followed by   Derrill Memo ON 11/11/2018] apixaban  5 mg Oral BID   aspirin EC  81 mg Oral Daily   atorvastatin  40 mg Oral QHS   cholecalciferol  1,000 Units Oral BID   ezetimibe  10 mg Oral Daily   folic acid  1 mg Oral QHS   isosorbide mononitrate  15 mg Oral Daily   methylPREDNISolone (SOLU-MEDROL) injection  60 mg Intravenous Q12H   multivitamin with minerals  1 tablet Oral Daily   pantoprazole  80 mg Oral Daily   vitamin C  1,000 mg Oral BID   Continuous Infusions:  PRN Meds:.acetaminophen **OR** acetaminophen, albuterol, alum & mag hydroxide-simeth, alum hydroxide-mag trisilicate, calcium carbonate, guaiFENesin-dextromethorphan, hydrALAZINE, ondansetron, sodium chloride  DVT prophylaxis: Lovenox Code Status: Full code Family Communication: d/w patient in detail, discussed with wife Mardene Celeste on the phone Disposition Plan: home when ready   Consultants:   None   Procedures:   None   Antimicrobials:  None    Objective: Vitals:   11/05/18 0356 11/05/18 0713 11/05/18 0725 11/05/18 1143  BP:   128/85 109/62  Pulse: 64  66   Resp: 14 18 17    Temp:   (!) 97.2 F (36.2 C) 97.8 F (36.6 C)  TempSrc:   Axillary Oral  SpO2: 100% 93% 94%   Weight:      Height:        Intake/Output  Summary (Last 24 hours) at 11/05/2018 1201 Last data filed at 11/05/2018 0600 Gross per 24 hour  Intake --  Output 1100 ml  Net -1100 ml   Filed Weights   10/29/18 0422  Weight: 92.2 kg    Examination:  Constitutional: No distress, laying in bed Eyes: No scleral icterus ENMT: Moist mucous membranes Respiratory: No wheezing, no crackles, faint rhonchi bilateral bases Cardiovascular: Regular rate and rhythm, no murmurs.  No peripheral edema Abdomen: Soft, nontender, nondistended, positive bowel sounds Musculoskeletal: no clubbing / cyanosis.  Skin: No rash seen Neurologic: No focal deficits, equal strength Psychiatric: Alert and oriented x3   Data Reviewed: I have independently reviewed following labs and imaging studies   CBC: Recent Labs  Lab 11/02/18 1340 11/03/18 0340 11/04/18 0133 11/05/18 0150  WBC 13.6* 10.1 10.3 12.1*  NEUTROABS  --   --   --  10.8*  HGB 12.5* 12.3* 11.6* 11.9*  HCT 37.2* 37.6* 35.2* 36.5*  MCV 92.1 92.8 92.1 92.6  PLT 133* 115* 113* 423*   Basic Metabolic Panel: Recent Labs  Lab 11/01/18 0500 11/02/18 0430 11/03/18 0340 11/04/18 0133 11/05/18 0150  NA 139 137 142 139 139  K 4.5 4.2 4.7 4.0 4.6  CL 104 104 107 104 105  CO2 22 22 26 24 23   GLUCOSE 146* 195* 190* 204* 197*  BUN 53* 51* 47* 48* 51*  CREATININE 1.21 1.06 1.15 1.19 1.18  CALCIUM 8.2* 7.8* 8.2* 8.0* 8.1*  MG 2.4  --   --   --   --    GFR: Estimated Creatinine Clearance: 61.7 mL/min (by C-G formula based on SCr of 1.18 mg/dL). Liver Function Tests: Recent Labs  Lab 11/02/18 0430 11/04/18 0133 11/05/18 0150  AST 113* 50* 61*  ALT 127* 91* 100*  ALKPHOS 69 73 78  BILITOT 0.7 0.8 0.8  PROT 5.9* 5.7* 5.6*  ALBUMIN 2.6* 2.7* 2.6*   No results for input(s): LIPASE, AMYLASE in the last 168 hours. No results for input(s): AMMONIA in the last 168 hours. Coagulation Profile: Recent Labs  Lab 11/03/18 1500  INR 1.2   Cardiac Enzymes: No results for input(s):  CKTOTAL, CKMB, CKMBINDEX, TROPONINI in the last 168 hours. BNP (last 3 results) No results for input(s): PROBNP in the last 8760 hours. HbA1C: No results for input(s): HGBA1C in the last 72 hours. CBG: No results for input(s): GLUCAP in the last 168 hours. Lipid Profile: No results for input(s): CHOL, HDL, LDLCALC, TRIG, CHOLHDL, LDLDIRECT in the last 72 hours. Thyroid Function Tests: No results for input(s): TSH, T4TOTAL, FREET4, T3FREE, THYROIDAB in the last 72 hours. Anemia Panel: Recent Labs    11/04/18 0133 11/05/18 0150  FERRITIN 158 222   Urine analysis:    Component Value Date/Time   COLORURINE YELLOW 04/24/2018 [place] 04/24/2018 1042   LABSPEC 1.008 04/24/2018 1042   PHURINE 5.0 04/24/2018 1042   GLUCOSEU NEGATIVE 04/24/2018 1042   HGBUR MODERATE (A) 04/24/2018 1042   BILIRUBINUR NEGATIVE 04/24/2018 1042   [place] 04/24/2018 1042  PROTEINUR 30 (A) 04/24/2018 1042   NITRITE NEGATIVE 04/24/2018 1042   LEUKOCYTESUR NEGATIVE 04/24/2018 1042   Sepsis Labs: Invalid input(s): PROCALCITONIN, LACTICIDVEN  Recent Results (from the past 240 hour(s))  SARS Coronavirus 2 (CEPHEID - Performed in [place] hospital lab), Hosp Order     Status: Abnormal   Collection Time: 10/28/18 11:02 PM   Specimen: Nasopharyngeal Swab  Result Value Ref Range Status   SARS Coronavirus 2 POSITIVE (A) NEGATIVE Final    Comment: RESULT CALLED TO, READ BACK BY AND VERIFIED WITH: TCeasar Mons [id number] 10/29/2018 T. TYSOR (NOTE) If result is NEGATIVE SARS-CoV-2 target nucleic acids are NOT DETECTED. The SARS-CoV-2 RNA is generally detectable in upper and lower  respiratory specimens during the acute phase of infection. The lowest  concentration of SARS-CoV-2 viral copies this assay can detect is 250  copies / mL. A negative result does not preclude SARS-CoV-2 infection  and should not be used as the sole basis for treatment or other  patient management decisions.   A negative result may occur with  improper specimen collection / handling, submission of specimen other  than nasopharyngeal swab, presence of viral mutation(s) within the  areas targeted by this assay, and inadequate number of viral copies  (<250 copies / mL). A negative result must be combined with clinical  observations, patient history, and epidemiological information. If result is POSITIVE SARS-CoV-2 target nucleic acids are DETECTED.  The SARS-CoV-2 RNA is generally detectable in upper and lower  respiratory specimens during the acute phase of infection.  Positive  results are indicative of active infection with SARS-CoV-2.  Clinical  correlation with patient history and other diagnostic information is  necessary to determine patient infection status.  Positive results do  not rule out bacterial infection or co-infection with other viruses. If result is PRESUMPTIVE POSTIVE SARS-CoV-2 nucleic acids MAY BE PRESENT.   A presumptive positive result was obtained on the submitted specimen  and confirmed on repeat testing.  While 2019 novel coronavirus  (SARS-CoV-2) nucleic acids may be present in the submitted sample  additional confirmatory testing may be necessary for epidemiological  and / or clinical management purposes  to differentiate between  SARS-CoV-2 and other Sarbecovirus currently known to infect humans.  If clinically indicated additional testing with an alternate test  methodology [phone])  is advised. The SARS-CoV-2 RNA is generally  detectable in upper and lower respiratory specimens during the acute  phase of infection. The expected result is Negative. Fact Sheet for Patients:  StrictlyIdeas.no Fact Sheet for Healthcare Providers: BankingDealers.co.za This test is not yet approved or cleared by the [place] FDA and has been authorized for detection and/or diagnosis of SARS-CoV-2 by FDA under an Emergency Use  Authorization (EUA).  This EUA will remain in effect (meaning this test can be used) for the duration of the COVID-19 declaration under Section 564(b)(1) of the Act, 21 U.S.C. section 360bbb-3(b)(1), unless the authorization is terminated or revoked sooner. Performed at [facility], [place] [address] [place], [place] [address]   Blood Culture (routine x 2)     Status: None   Collection Time: 10/29/18 12:50 AM   Specimen: BLOOD  Result Value Ref Range Status   Specimen Description BLOOD RIGHT HAND  Final   Special Requests   Final    BOTTLES DRAWN AEROBIC AND ANAEROBIC Blood Culture adequate volume   Culture   Final    NO GROWTH 5 DAYS Performed at [facility], [address] [place], [place] [address]    Report Status 11/03/2018 FINAL  Final  Blood Culture (routine x 2)     Status: None   Collection Time: 10/29/18 12:55 AM   Specimen: BLOOD  Result Value Ref Range Status   Specimen Description BLOOD LEFT HAND  Final   Special Requests   Final    BOTTLES DRAWN AEROBIC AND ANAEROBIC Blood Culture adequate volume   Culture   Final    NO GROWTH 5 DAYS Performed at [facility], [address] [place], [place] [address]    Report Status 11/03/2018 FINAL  Final      Radiology Studies: Ct Angio Chest Pe W Or Wo Contrast  Result Date: 11/03/2018 CLINICAL DATA:  PE suspected EXAM: CT ANGIOGRAPHY CHEST WITH CONTRAST TECHNIQUE: Multidetector CT imaging of the chest was performed using the standard protocol during bolus administration of intravenous contrast. Multiplanar CT image reconstructions and MIPs were obtained to evaluate the vascular anatomy. CONTRAST:  173mL OMNIPAQUE IOHEXOL 350 MG/ML SOLN COMPARISON:  10/29/2018 FINDINGS: Cardiovascular: Satisfactory opacification of the pulmonary arteries to the segmental level. Positive examination for pulmonary embolism with subsegmental embolus noted in the medial segment of the right lower lobe (series 4, image 110). The  RV to LV ratio is elevated at approximately 1.3. Three-vessel coronary artery calcifications. No pericardial effusion. Aortic atherosclerosis. Mediastinum/Nodes: No enlarged mediastinal, hilar, or axillary lymph nodes. Thyroid gland, trachea, and esophagus demonstrate no significant findings. Lungs/Pleura: Diffuse bilateral ground-glass pulmonary opacity, which is more diffuse, although less consolidative and conspicuous in comparison to prior examination. No pleural effusion or pneumothorax. Upper Abdomen: No acute abnormality. Musculoskeletal: No chest wall abnormality. No acute or significant osseous findings. Review of the MIP images confirms the above findings. IMPRESSION: 1. Positive examination for pulmonary embolism with subsegmental embolus noted in the medial segment of the right lower lobe (series 4, image 110). 2. The RV to LV ratio is elevated at approximately 1.3, although given isolated subsegmental embolus this is not likely secondary to embolus induced pulmonary hypertension. 3.  Coronary artery disease. 4. Diffuse bilateral ground-glass pulmonary opacity, which is more diffuse, although less consolidative and conspicuous in comparison to prior examination. Findings remain consistent with evolution of COVID-19. These results were called by telephone at the time of interpretation on 11/03/2018 at 1:28 pm to Dr. Marzetta Board , who verbally acknowledged these results. Electronically Signed   By: Eddie Candle M.D.   On: 11/03/2018 13:31   Marzetta Board, MD, PhD [facility]  Contact via  [email or web address]  [place] P: [phone] F: [phone]

## 2018-11-05 NOTE — TOC Benefit Eligibility Note (Signed)
Transition of Care [facility]) Benefit Eligibility Note    Patient Details  Name: Alex [name]. MRN: [id number] Date of Birth: 06-15-1940   Medication/Dose: ELIQUIS 2.5 MG BID  AND ELIQUIS  5 MG BID  Covered?: Yes  Tier: 3 Drug  Prescription Coverage Preferred Pharmacy: [facility]  Spoke with Person/Company/Phone Number:: OLIVIA  @  [employer] # [phone] [employer]  Co-Pay: $156.00  Prior Approval: No  Deductible: Unmet       Memory Argue Phone Number: 11/05/2018, 10:09 AM

## 2018-11-06 LAB — CBC
HCT: 36.1 % — ABNORMAL LOW (ref 39.0–52.0)
Hemoglobin: 11.9 g/dL — ABNORMAL LOW (ref 13.0–17.0)
MCH: 30.7 pg (ref 26.0–34.0)
MCHC: 33 g/dL (ref 30.0–36.0)
MCV: 93 fL (ref 80.0–100.0)
Platelets: 129 10*3/uL — ABNORMAL LOW (ref 150–400)
RBC: 3.88 MIL/uL — ABNORMAL LOW (ref 4.22–5.81)
RDW: 15.4 % (ref 11.5–15.5)
WBC: 14.7 10*3/uL — ABNORMAL HIGH (ref 4.0–10.5)
nRBC: 0.1 % (ref 0.0–0.2)

## 2018-11-06 LAB — COMPREHENSIVE METABOLIC PANEL
ALT: 92 U/L — ABNORMAL HIGH (ref 0–44)
AST: 52 U/L — ABNORMAL HIGH (ref 15–41)
Albumin: 2.5 g/dL — ABNORMAL LOW (ref 3.5–5.0)
Alkaline Phosphatase: 81 U/L (ref 38–126)
Anion gap: 9 (ref 5–15)
BUN: 49 mg/dL — ABNORMAL HIGH (ref 8–23)
CO2: 24 mmol/L (ref 22–32)
Calcium: 7.9 mg/dL — ABNORMAL LOW (ref 8.9–10.3)
Chloride: 105 mmol/L (ref 98–111)
Creatinine, Ser: 1.08 mg/dL (ref 0.61–1.24)
GFR calc Af Amer: >60 mL/min (ref >60)
GFR calc non Af Amer: >60 mL/min (ref >60)
Glucose, Bld: 238 mg/dL — ABNORMAL HIGH (ref 70–99)
Potassium: 4.5 mmol/L (ref 3.5–5.1)
Sodium: 138 mmol/L (ref 135–145)
Total Bilirubin: 0.9 mg/dL (ref 0.3–1.2)
Total Protein: 5.3 g/dL — ABNORMAL LOW (ref 6.5–8.1)

## 2018-11-06 LAB — FERRITIN: Ferritin: 198 ng/mL (ref 24–336)

## 2018-11-06 LAB — GLUCOSE, CAPILLARY
Glucose-Capillary: 173 mg/dL — ABNORMAL HIGH (ref 70–99)
Glucose-Capillary: 197 mg/dL — ABNORMAL HIGH (ref 70–99)
Glucose-Capillary: 174 mg/dL — ABNORMAL HIGH (ref 70–99)
Glucose-Capillary: 133 mg/dL — ABNORMAL HIGH (ref 70–99)

## 2018-11-06 LAB — LACTATE DEHYDROGENASE: LDH: 375 U/L — ABNORMAL HIGH (ref 98–192)

## 2018-11-06 LAB — D-DIMER, QUANTITATIVE: D-Dimer, Quant: 3.24 ug/mL-FEU — ABNORMAL HIGH (ref 0.00–0.50)

## 2018-11-06 LAB — C-REACTIVE PROTEIN: CRP: <0.8 mg/dL (ref <1.0)

## 2018-11-06 MED ORDER — GUAIFENESIN ER 600 MG PO TB12
1200.0000 mg | ORAL_TABLET | Freq: Two times a day (BID) | ORAL | Status: DC
  Administered 2018-11-06 – 2018-11-17 (×23): 1200 mg via ORAL
  Filled 2018-11-06 (×24): qty 2

## 2018-11-06 MED ORDER — FUROSEMIDE 10 MG/ML IJ SOLN
40.0000 mg | Freq: Once | INTRAMUSCULAR | Status: AC
  Administered 2018-11-06: 40 mg via INTRAVENOUS
  Filled 2018-11-06: qty 4

## 2018-11-06 MED ORDER — FUROSEMIDE 20 MG PO TABS
40.0000 mg | ORAL_TABLET | Freq: Every day | ORAL | Status: DC
  Administered 2018-11-07: 40 mg via ORAL
  Filled 2018-11-06: qty 2

## 2018-11-06 NOTE — Progress Notes (Signed)
PROGRESS NOTE  [name]. KKX:[id number] DOB: 02-25-1941 DOA: 10/28/2018 PCP: [name], MD   LOS: 8 days   Brief Narrative / Interim history: 78 year old male with history of amyloidosis, nephrotic syndrome, on immunosuppressive therapy and steroids, hypertension, coronary artery disease, moderate left ear who came to the ED and was admitted to the hospital on 10/28/2018 with shortness of breath, was diagnosed with COVID-19 pneumonia and was admitted to the hospital.  Subjective: He feels more short of breath this morning, appears somewhat emotional.  He denies any chest pain, no abdominal pain, no nausea or vomiting  Assessment & Plan: Principal Problem:   Pneumonia due to COVID-19 virus Active Problems:   Essential hypertension   AL amyloidosis (HCC)   Acute respiratory failure with hypoxia (HCC)   Thrombocytopenia ([place])   Principal Problem Acute Hypoxic Respiratory Failure due to Covid-19 Viral Illness -Patient initially hospitalized requiring 3 4 L nasal cannula, started on Remdesivir along with steroids, however on 7/10-7/11 his respiratory status worsened requiring transition to 12 L high flow nasal cannula.  At that time patient received Actemra along with convalescent plasma -Actemra does not seem to interfere with his amyloidosis treatment at home, this was discussed with pharmacy as well as his primary oncologist Dr. [name] -Patient is quite slow to improve, this morning his breathing seems to be a little bit worse and he is back up on 8 L high flow from 6 L yesterday and this was also associated with a coughing spell.  We will administer Lasix along with Mucinex   COVID-19 Labs  Recent Labs    11/04/18 0133 11/05/18 0150 11/06/18 0215  DDIMER 7.76* 3.24* 3.24*  FERRITIN 158 222 198  LDH 310* 386* 375*  CRP <0.8 <0.8 <0.8    Lab Results  Component Value Date   SARSCOV2NAA POSITIVE (A) 10/28/2018    Active Problems Acute PE -Due to worsening  hypoxia and slight increase in the d-dimer underwent a CT angiogram on 7/11.  CT scan was positive for PE with subsegmental embolus in the medial segment of the right lower lobe, discussed with radiology over the phone and unlikely to alone explain his degree of hypoxia.  CT scan also showed diffuse bilateral groundglass pulmonary opacities consistent with COVID-19 evolution -He was placed on heparin infusion, has remained stable, no bleeding, was transitioned to Eliquis and she seemed to be tolerating this well  AL amyloidosis -Follows with oncology as an outpatient -Continue steroids and home medications  Chronic thrombocytopenia -Platelets remain stable  Hypertension -Continue home medications, his Lasix was on hold on 7/12 and 13 due to the fact that he received a CT angiogram, resume Lasix today  Hyperlipidemia -Continue atorvastatin, Zetia  Coronary artery disease -No chest pain, stable  PVCs on monitor -Asymptomatic, stable  Scheduled Meds: . acyclovir  400 mg Oral BID  . apixaban  10 mg Oral BID   Followed by  . [START ON 11/11/2018] apixaban  5 mg Oral BID  . aspirin EC  81 mg Oral Daily  . atorvastatin  40 mg Oral QHS  . cholecalciferol  1,000 Units Oral BID  . dexamethasone  6 mg Oral Daily  . ezetimibe  10 mg Oral Daily  . folic acid  1 mg Oral QHS  . [START ON 11/07/2018] furosemide  40 mg Oral Daily  . guaiFENesin  1,200 mg Oral BID  . insulin aspart  0-9 Units Subcutaneous TID WC  . isosorbide mononitrate  15 mg Oral Daily  .  multivitamin with minerals  1 tablet Oral Daily  . pantoprazole  80 mg Oral Daily  . vitamin C  1,000 mg Oral BID   Continuous Infusions:  PRN Meds:.acetaminophen **OR** acetaminophen, albuterol, alum & mag hydroxide-simeth, alum hydroxide-mag trisilicate, calcium carbonate, guaiFENesin-dextromethorphan, hydrALAZINE, ondansetron, sodium chloride  DVT prophylaxis: Lovenox Code Status: Full code Family Communication: d/w patient in  detail, discussed with wife Mardene Celeste on the phone Disposition Plan: home when ready   Consultants:   None   Procedures:   None   Antimicrobials:  None    Objective: Vitals:   11/06/18 0000 11/06/18 0202 11/06/18 0624 11/06/18 0806  BP: 118/74  120/65 132/69  Pulse: 65 63 63 73  Resp: 20 20 18 19   Temp: 98.1 F (36.7 C)  98.2 F (36.8 C) 97.8 F (36.6 C)  TempSrc:   Oral Oral  SpO2: 96% 97% 93% 91%  Weight:      Height:        Intake/Output Summary (Last 24 hours) at 11/06/2018 1122 Last data filed at 11/06/2018 3329 Gross per 24 hour  Intake 970 ml  Output 1050 ml  Net -80 ml   Filed Weights   10/29/18 0422  Weight: 92.2 kg    Examination:  Constitutional: A bit emotional, breathing harder than normal, eating breakfast Eyes: No scleral icterus seen ENMT: Moist mucous membranes Respiratory: Bibasilar rhonchi, no wheezing, no crackles, moves air well Cardiovascular: Regular rate and rhythm, no murmurs.  Trace edema Abdomen: Soft, nontender, nondistended, positive bowel sounds Musculoskeletal: no clubbing / cyanosis.  Skin: No rashes seen Neurologic: Nonfocal, equal strength Psychiatric: Alert and oriented x3   Data Reviewed: I have independently reviewed following labs and imaging studies   CBC: Recent Labs  Lab 11/02/18 1340 11/03/18 0340 11/04/18 0133 11/05/18 0150 11/06/18 0215  WBC 13.6* 10.1 10.3 12.1* 14.7*  NEUTROABS  --   --   --  10.8*  --   HGB 12.5* 12.3* 11.6* 11.9* 11.9*  HCT 37.2* 37.6* 35.2* 36.5* 36.1*  MCV 92.1 92.8 92.1 92.6 93.0  PLT 133* 115* 113* 116* 518*   Basic Metabolic Panel: Recent Labs  Lab 11/01/18 0500 11/02/18 0430 11/03/18 0340 11/04/18 0133 11/05/18 0150 11/06/18 0215  NA 139 137 142 139 139 138  K 4.5 4.2 4.7 4.0 4.6 4.5  CL 104 104 107 104 105 105  CO2 22 22 26 24 23 24   GLUCOSE 146* 195* 190* 204* 197* 238*  BUN 53* 51* 47* 48* 51* 49*  CREATININE 1.21 1.06 1.15 1.19 1.18 1.08  CALCIUM 8.2* 7.8*  8.2* 8.0* 8.1* 7.9*  MG 2.4  --   --   --   --   --    GFR: Estimated Creatinine Clearance: 67.5 mL/min (by C-G formula based on SCr of 1.08 mg/dL). Liver Function Tests: Recent Labs  Lab 11/02/18 0430 11/04/18 0133 11/05/18 0150 11/06/18 0215  AST 113* 50* 61* 52*  ALT 127* 91* 100* 92*  ALKPHOS 69 73 78 81  BILITOT 0.7 0.8 0.8 0.9  PROT 5.9* 5.7* 5.6* 5.3*  ALBUMIN 2.6* 2.7* 2.6* 2.5*   No results for input(s): LIPASE, AMYLASE in the last 168 hours. No results for input(s): AMMONIA in the last 168 hours. Coagulation Profile: Recent Labs  Lab 11/03/18 1500  INR 1.2   Cardiac Enzymes: No results for input(s): CKTOTAL, CKMB, CKMBINDEX, TROPONINI in the last 168 hours. BNP (last 3 results) No results for input(s): PROBNP in the last 8760 hours. HbA1C: No results  for input(s): HGBA1C in the last 72 hours. CBG: Recent Labs  Lab 11/05/18 1638 11/05/18 2112 11/06/18 0812  GLUCAP 208* 163* 173*   Lipid Profile: No results for input(s): CHOL, HDL, LDLCALC, TRIG, CHOLHDL, LDLDIRECT in the last 72 hours. Thyroid Function Tests: No results for input(s): TSH, T4TOTAL, FREET4, T3FREE, THYROIDAB in the last 72 hours. Anemia Panel: Recent Labs    11/05/18 0150 11/06/18 0215  FERRITIN 222 198   Urine analysis:    Component Value Date/Time   COLORURINE YELLOW 04/24/2018 [place] 04/24/2018 1042   LABSPEC 1.008 04/24/2018 1042   PHURINE 5.0 04/24/2018 1042   GLUCOSEU NEGATIVE 04/24/2018 1042   HGBUR MODERATE (A) 04/24/2018 1042   BILIRUBINUR NEGATIVE 04/24/2018 1042   [place] 04/24/2018 1042   PROTEINUR 30 (A) 04/24/2018 1042   NITRITE NEGATIVE 04/24/2018 1042   LEUKOCYTESUR NEGATIVE 04/24/2018 1042   Sepsis Labs: Invalid input(s): PROCALCITONIN, LACTICIDVEN  Recent Results (from the past 240 hour(s))  SARS Coronavirus 2 (CEPHEID - Performed in [place] hospital lab), Hosp Order     Status: Abnormal   Collection Time: 10/28/18 11:02  PM   Specimen: Nasopharyngeal Swab  Result Value Ref Range Status   SARS Coronavirus 2 POSITIVE (A) NEGATIVE Final    Comment: RESULT CALLED TO, READ BACK BY AND VERIFIED WITH: TCeasar Mons [id number] 10/29/2018 T. TYSOR (NOTE) If result is NEGATIVE SARS-CoV-2 target nucleic acids are NOT DETECTED. The SARS-CoV-2 RNA is generally detectable in upper and lower  respiratory specimens during the acute phase of infection. The lowest  concentration of SARS-CoV-2 viral copies this assay can detect is 250  copies / mL. A negative result does not preclude SARS-CoV-2 infection  and should not be used as the sole basis for treatment or other  patient management decisions.  A negative result may occur with  improper specimen collection / handling, submission of specimen other  than nasopharyngeal swab, presence of viral mutation(s) within the  areas targeted by this assay, and inadequate number of viral copies  (<250 copies / mL). A negative result must be combined with clinical  observations, patient history, and epidemiological information. If result is POSITIVE SARS-CoV-2 target nucleic acids are DETECTED.  The SARS-CoV-2 RNA is generally detectable in upper and lower  respiratory specimens during the acute phase of infection.  Positive  results are indicative of active infection with SARS-CoV-2.  Clinical  correlation with patient history and other diagnostic information is  necessary to determine patient infection status.  Positive results do  not rule out bacterial infection or co-infection with other viruses. If result is PRESUMPTIVE POSTIVE SARS-CoV-2 nucleic acids MAY BE PRESENT.   A presumptive positive result was obtained on the submitted specimen  and confirmed on repeat testing.  While 2019 novel coronavirus  (SARS-CoV-2) nucleic acids may be present in the submitted sample  additional confirmatory testing may be necessary for epidemiological  and / or clinical management purposes  to  differentiate between  SARS-CoV-2 and other Sarbecovirus currently known to infect humans.  If clinically indicated additional testing with an alternate test  methodology [phone])  is advised. The SARS-CoV-2 RNA is generally  detectable in upper and lower respiratory specimens during the acute  phase of infection. The expected result is Negative. Fact Sheet for Patients:  StrictlyIdeas.no Fact Sheet for Healthcare Providers: BankingDealers.co.za This test is not yet approved or cleared by the [place] FDA and has been authorized for detection and/or diagnosis of SARS-CoV-2 by FDA under  an Emergency Use Authorization (EUA).  This EUA will remain in effect (meaning this test can be used) for the duration of the COVID-19 declaration under Section 564(b)(1) of the Act, 21 U.S.C. section 360bbb-3(b)(1), unless the authorization is terminated or revoked sooner. Performed at [facility], [place] [address] [place], [place] [address]   Blood Culture (routine x 2)     Status: None   Collection Time: 10/29/18 12:50 AM   Specimen: BLOOD  Result Value Ref Range Status   Specimen Description BLOOD RIGHT HAND  Final   Special Requests   Final    BOTTLES DRAWN AEROBIC AND ANAEROBIC Blood Culture adequate volume   Culture   Final    NO GROWTH 5 DAYS Performed at [facility], [address] [place], [place] [address]    Report Status 11/03/2018 FINAL  Final  Blood Culture (routine x 2)     Status: None   Collection Time: 10/29/18 12:55 AM   Specimen: BLOOD  Result Value Ref Range Status   Specimen Description BLOOD LEFT HAND  Final   Special Requests   Final    BOTTLES DRAWN AEROBIC AND ANAEROBIC Blood Culture adequate volume   Culture   Final    NO GROWTH 5 DAYS Performed at [facility], [place] [address] [place],  [address]    Report Status 11/03/2018 FINAL  Final      Radiology Studies: No results found.  Marzetta Board, MD, PhD [facility]  Contact via  [email or web address]  [place] P: [phone] F: [phone]

## 2018-11-07 ENCOUNTER — Inpatient Hospital Stay (HOSPITAL_COMMUNITY): Payer: Self-pay

## 2018-11-07 LAB — CBC
HCT: 38.6 % — ABNORMAL LOW (ref 39.0–52.0)
Hemoglobin: 13.1 g/dL (ref 13.0–17.0)
MCH: 31.4 pg (ref 26.0–34.0)
MCHC: 33.9 g/dL (ref 30.0–36.0)
MCV: 92.6 fL (ref 80.0–100.0)
Platelets: 119 10*3/uL — ABNORMAL LOW (ref 150–400)
RBC: 4.17 MIL/uL — ABNORMAL LOW (ref 4.22–5.81)
RDW: 15.4 % (ref 11.5–15.5)
WBC: 12.7 10*3/uL — ABNORMAL HIGH (ref 4.0–10.5)
nRBC: 0.2 % (ref 0.0–0.2)

## 2018-11-07 LAB — MAGNESIUM: Magnesium: 2.5 mg/dL — ABNORMAL HIGH (ref 1.7–2.4)

## 2018-11-07 LAB — COMPREHENSIVE METABOLIC PANEL
ALT: 80 U/L — ABNORMAL HIGH (ref 0–44)
AST: 42 U/L — ABNORMAL HIGH (ref 15–41)
Albumin: 2.6 g/dL — ABNORMAL LOW (ref 3.5–5.0)
Alkaline Phosphatase: 80 U/L (ref 38–126)
Anion gap: 11 (ref 5–15)
BUN: 57 mg/dL — ABNORMAL HIGH (ref 8–23)
CO2: 25 mmol/L (ref 22–32)
Calcium: 8.1 mg/dL — ABNORMAL LOW (ref 8.9–10.3)
Chloride: 103 mmol/L (ref 98–111)
Creatinine, Ser: 1.27 mg/dL — ABNORMAL HIGH (ref 0.61–1.24)
GFR calc Af Amer: >60 mL/min (ref >60)
GFR calc non Af Amer: 55 mL/min — ABNORMAL LOW (ref >60)
Glucose, Bld: 118 mg/dL — ABNORMAL HIGH (ref 70–99)
Potassium: 4.3 mmol/L (ref 3.5–5.1)
Sodium: 139 mmol/L (ref 135–145)
Total Bilirubin: 1.2 mg/dL (ref 0.3–1.2)
Total Protein: 5.6 g/dL — ABNORMAL LOW (ref 6.5–8.1)

## 2018-11-07 LAB — D-DIMER, QUANTITATIVE: D-Dimer, Quant: 3.06 ug/mL-FEU — ABNORMAL HIGH (ref 0.00–0.50)

## 2018-11-07 LAB — LACTATE DEHYDROGENASE: LDH: 380 U/L — ABNORMAL HIGH (ref 98–192)

## 2018-11-07 LAB — FERRITIN: Ferritin: 149 ng/mL (ref 24–336)

## 2018-11-07 LAB — GLUCOSE, CAPILLARY
Glucose-Capillary: 94 mg/dL (ref 70–99)
Glucose-Capillary: 173 mg/dL — ABNORMAL HIGH (ref 70–99)

## 2018-11-07 LAB — C-REACTIVE PROTEIN: CRP: <0.8 mg/dL (ref <1.0)

## 2018-11-07 NOTE — Progress Notes (Signed)
PROGRESS NOTE    Alex [name].  EQA:[id number] DOB: 28-Aug-1940 DOA: 10/28/2018 PCP: [name], MD      Brief Narrative:  [name] is a 78 y.o. M with hx amyloidosis, nephrotic syndrome baseline Cr 1.1-1.2, chronic thrombocytopenia, HTN and CAD who presented with SOB, cough found to have COVID requiring 4L at admission.    Assessment & Plan:  Coronavirus pneumonitis with acute hypoxic respiratory failure In setting of ongoing 2020 COVID-19 pandemic.  S/p remdesivir 7/6-7/10 S/p Actemra 7/11 S/p plasma 7/10  S/p steroids 7/6-7/15  O2 needs remain at 8-12L, quickly desaturates with eating, talking, exertion, but also quickly rebounds. -Aggressive pulmonary toilet -Mobilize as able -Tolerate SpO2 in high 70s or 80s with exertion for periods up to 10-15 minutes okay  -VTE PPx with Eliquis -Continue Zinc and Vitamin C   COVID-19 Labs Recent Labs    11/05/18 0150 11/06/18 0215 11/07/18 0250  DDIMER 3.24* 3.24* 3.06*  FERRITIN 222 198 149  LDH 386* 375* 380*  CRP <0.8 <0.8 <0.8     Acute pulmonary embolism This was diagnosed 7/11, subsegmental in character.   Treated initially with heparin, transitioned to Eliquis.  First provoked PE.  CTA reviewed with Radiology and degree of hypoxia clearly out of proportion to small PE burden. -Continue Eliqiuis  Acute hypoxic respiratory failure Not cardiogenic.  Not from PE.  Suspect this is all post-COVID-ARDS hypoxia.   -Aggressive pulmonary toilet  Amyloid disease -Continue acyclovir PPx  Hypertension Coronary disease secondary prevention BP low today. -Continue aspirin, Lipitor, Zetia -Hold Lasix -Hold Imdur -Hold home losartan -May have PRN hydralazine if rebound hypertension  Hyperglycemia -Continue SSI corrections  Other medicaitons -Continue PPI  Chronic thrombocytopenia Mild, stable  CKD III Cr stable at 1.1-1.3       MDM and disposition: The below labs and imaging reports were  reviewed and summarized above.  Medication management as above.  The patient was admitted with severe acute hypoxic respiratory failure.  He remains on high degree of O2, this is a severe acute illness that poses an ongoing threat to life.        DVT prophylaxis: Eliquis Code Status: FULL Family Communication: Wife by phone    Consultants:   CCM  Procedures:   CTA 7/11  Antimicrobials:   None   Culture data:   7/5 blood culture x2 NGTD       Subjective: Feeling tired and out of breath but no fever, no confusion. No sputum, no chest pain.  No focal weakness.  No vomiting, no passing out.   Objective: Vitals:   11/07/18 0633 11/07/18 0651 11/07/18 0723 11/07/18 1131  BP:    (!) 89/56  Pulse: (!) 56 (!) 57 (!) 58 63  Resp: 20 16 17  (!) 21  Temp:    98.1 F (36.7 C)  TempSrc:      SpO2: 94% 95% 97% (!) 89%  Weight:      Height:        Intake/Output Summary (Last 24 hours) at 11/07/2018 1420 Last data filed at 11/07/2018 1133 Gross per 24 hour  Intake 560 ml  Output 1950 ml  Net -1390 ml   Filed Weights   10/29/18 0422  Weight: 92.2 kg    Examination: General appearance: thin elderly adult male, alert and in mild respiratory distress.   HEENT: Anicteric, conjunctiva pink, lids and lashes normal. No nasal deformity, discharge, epistaxis.  Lips moist, dentition good, OP moist, no oral lesions.  Skin: Warm and dry.  no jaundice.  No suspicious rashes or lesions. Cardiac: tachycardic, regular, nl S1-S2, murmur appreciated.  Capillary refill is brisk.  JVP normal.  No LE edema.  Radial pulses 2+ and symmetric. Respiratory: Tachpyneic, pursed liip breathing, no rales or wheezes. Abdomen: Abdomen soft.  No TTP or grimace or guarding. No ascites, distension, hepatosplenomegaly.   MSK: No deformities or effusions. Neuro: Awake and alert.  EOMI, moves all extremities. Speech fluent.    Psych: Sensorium intact and responding to questions, attention normal.  Affect normal.  Judgment and insight appear normal.      Data Reviewed: I have personally reviewed following labs and imaging studies:  CBC: Recent Labs  Lab 11/03/18 0340 11/04/18 0133 11/05/18 0150 11/06/18 0215 11/07/18 0250  WBC 10.1 10.3 12.1* 14.7* 12.7*  NEUTROABS  --   --  10.8*  --   --   HGB 12.3* 11.6* 11.9* 11.9* 13.1  HCT 37.6* 35.2* 36.5* 36.1* 38.6*  MCV 92.8 92.1 92.6 93.0 92.6  PLT 115* 113* 116* 129* 790*   Basic Metabolic Panel: Recent Labs  Lab 11/01/18 0500  11/03/18 0340 11/04/18 0133 11/05/18 0150 11/06/18 0215 11/07/18 0250  NA 139   < > 142 139 139 138 139  K 4.5   < > 4.7 4.0 4.6 4.5 4.3  CL 104   < > 107 104 105 105 103  CO2 22   < > 26 24 23 24 25   GLUCOSE 146*   < > 190* 204* 197* 238* 118*  BUN 53*   < > 47* 48* 51* 49* 57*  CREATININE 1.21   < > 1.15 1.19 1.18 1.08 1.27*  CALCIUM 8.2*   < > 8.2* 8.0* 8.1* 7.9* 8.1*  MG 2.4  --   --   --   --   --   --    < > = values in this interval not displayed.   GFR: Estimated Creatinine Clearance: 57.4 mL/min (A) (by C-G formula based on SCr of 1.27 mg/dL (H)). Liver Function Tests: Recent Labs  Lab 11/02/18 0430 11/04/18 0133 11/05/18 0150 11/06/18 0215 11/07/18 0250  AST 113* 50* 61* 52* 42*  ALT 127* 91* 100* 92* 80*  ALKPHOS 69 73 78 81 80  BILITOT 0.7 0.8 0.8 0.9 1.2  PROT 5.9* 5.7* 5.6* 5.3* 5.6*  ALBUMIN 2.6* 2.7* 2.6* 2.5* 2.6*   No results for input(s): LIPASE, AMYLASE in the last 168 hours. No results for input(s): AMMONIA in the last 168 hours. Coagulation Profile: Recent Labs  Lab 11/03/18 1500  INR 1.2   Cardiac Enzymes: No results for input(s): CKTOTAL, CKMB, CKMBINDEX, TROPONINI in the last 168 hours. BNP (last 3 results) No results for input(s): PROBNP in the last 8760 hours. HbA1C: No results for input(s): HGBA1C in the last 72 hours. CBG: Recent Labs  Lab 11/06/18 1136 11/06/18 1606 11/06/18 2305 11/07/18 0729 11/07/18 1209  GLUCAP 197* 174* 133*  94 173*   Lipid Profile: No results for input(s): CHOL, HDL, LDLCALC, TRIG, CHOLHDL, LDLDIRECT in the last 72 hours. Thyroid Function Tests: No results for input(s): TSH, T4TOTAL, FREET4, T3FREE, THYROIDAB in the last 72 hours. Anemia Panel: Recent Labs    11/06/18 0215 11/07/18 0250  FERRITIN 198 149   Urine analysis:    Component Value Date/Time   COLORURINE YELLOW 04/24/2018 [place] 04/24/2018 1042   LABSPEC 1.008 04/24/2018 1042   PHURINE 5.0 04/24/2018 [place] 04/24/2018 1042  HGBUR MODERATE (A) 04/24/2018 1042   BILIRUBINUR NEGATIVE 04/24/2018 1042   KETONESUR NEGATIVE 04/24/2018 1042   PROTEINUR 30 (A) 04/24/2018 1042   NITRITE NEGATIVE 04/24/2018 1042   LEUKOCYTESUR NEGATIVE 04/24/2018 1042   Sepsis Labs: @LABRCNTIP (procalcitonin:4,lacticacidven:4)  ) Recent Results (from the past 240 hour(s))  SARS Coronavirus 2 (CEPHEID - Performed in [place] hospital lab), Hosp Order     Status: Abnormal   Collection Time: 10/28/18 11:02 PM   Specimen: Nasopharyngeal Swab  Result Value Ref Range Status   SARS Coronavirus 2 POSITIVE (A) NEGATIVE Final    Comment: RESULT CALLED TO, READ BACK BY AND VERIFIED WITH: TCeasar Mons [id number] 10/29/2018 T. TYSOR (NOTE) If result is NEGATIVE SARS-CoV-2 target nucleic acids are NOT DETECTED. The SARS-CoV-2 RNA is generally detectable in upper and lower  respiratory specimens during the acute phase of infection. The lowest  concentration of SARS-CoV-2 viral copies this assay can detect is 250  copies / mL. A negative result does not preclude SARS-CoV-2 infection  and should not be used as the sole basis for treatment or other  patient management decisions.  A negative result may occur with  improper specimen collection / handling, submission of specimen other  than nasopharyngeal swab, presence of viral mutation(s) within the  areas targeted by this assay, and inadequate number of viral copies   (<250 copies / mL). A negative result must be combined with clinical  observations, patient history, and epidemiological information. If result is POSITIVE SARS-CoV-2 target nucleic acids are DETECTED.  The SARS-CoV-2 RNA is generally detectable in upper and lower  respiratory specimens during the acute phase of infection.  Positive  results are indicative of active infection with SARS-CoV-2.  Clinical  correlation with patient history and other diagnostic information is  necessary to determine patient infection status.  Positive results do  not rule out bacterial infection or co-infection with other viruses. If result is PRESUMPTIVE POSTIVE SARS-CoV-2 nucleic acids MAY BE PRESENT.   A presumptive positive result was obtained on the submitted specimen  and confirmed on repeat testing.  While 2019 novel coronavirus  (SARS-CoV-2) nucleic acids may be present in the submitted sample  additional confirmatory testing may be necessary for epidemiological  and / or clinical management purposes  to differentiate between  SARS-CoV-2 and other Sarbecovirus currently known to infect humans.  If clinically indicated additional testing with an alternate test  methodology [phone])  is advised. The SARS-CoV-2 RNA is generally  detectable in upper and lower respiratory specimens during the acute  phase of infection. The expected result is Negative. Fact Sheet for Patients:  StrictlyIdeas.no Fact Sheet for Healthcare Providers: BankingDealers.co.za This test is not yet approved or cleared by the [place] FDA and has been authorized for detection and/or diagnosis of SARS-CoV-2 by FDA under an Emergency Use Authorization (EUA).  This EUA will remain in effect (meaning this test can be used) for the duration of the COVID-19 declaration under Section 564(b)(1) of the Act, 21 U.S.C. section 360bbb-3(b)(1), unless the authorization is terminated or  revoked sooner. Performed at [facility], [place] [address] [place], [place] [address]   Blood Culture (routine x 2)     Status: None   Collection Time: 10/29/18 12:50 AM   Specimen: BLOOD  Result Value Ref Range Status   Specimen Description BLOOD RIGHT HAND  Final   Special Requests   Final    BOTTLES DRAWN AEROBIC AND ANAEROBIC Blood Culture adequate volume   Culture  Final    NO GROWTH 5 DAYS Performed at [facility], [place] [address] [place], [place] [address]    Report Status 11/03/2018 FINAL  Final  Blood Culture (routine x 2)     Status: None   Collection Time: 10/29/18 12:55 AM   Specimen: BLOOD  Result Value Ref Range Status   Specimen Description BLOOD LEFT HAND  Final   Special Requests   Final    BOTTLES DRAWN AEROBIC AND ANAEROBIC Blood Culture adequate volume   Culture   Final    NO GROWTH 5 DAYS Performed at [facility], [place] [address] [place], [place] [address]    Report Status 11/03/2018 FINAL  Final         Radiology Studies: No results found.      Scheduled Meds: . acyclovir  400 mg Oral BID  . apixaban  10 mg Oral BID   Followed by  . [START ON 11/11/2018] apixaban  5 mg Oral BID  . aspirin EC  81 mg Oral Daily  . atorvastatin  40 mg Oral QHS  . cholecalciferol  1,000 Units Oral BID  . ezetimibe  10 mg Oral Daily  . folic acid  1 mg Oral QHS  . guaiFENesin  1,200 mg Oral BID  . insulin aspart  0-9 Units Subcutaneous TID WC  . multivitamin with minerals  1 tablet Oral Daily  . pantoprazole  80 mg Oral Daily  . vitamin C  1,000 mg Oral BID   Continuous Infusions:   LOS: 9 days    Time spent: 35 minutes      Edwin Dada, MD [facility] 11/07/2018, 2:20 PM     Please page through [place]:  [email or web address] Password [email or web address] If 7PM-7AM, please contact night-coverage

## 2018-11-07 NOTE — Progress Notes (Signed)
Pt up to [facility] at 1830, and continue to have multiple PVC's, when he returned to bed, he had 7 beat run of SVT.  MD wanted the Pt to get up and walk, Pt indicated that he was tired, and wanted to walk later.  Notified MD of the above.

## 2018-11-07 NOTE — Progress Notes (Signed)
Pt up to [facility] for BM, during the Bm Pt had multiple PVC's, irregular HR, in the 70's and 80's, desaturated in to the 60's, and SOB increased.  He c/o of not being able to catch his breathe while sitting on the [facility].  Denied chest pain or any discomfort.  Once he returned to bed, his HR rhythm returned to normal, and he stated he was not as sob. Advised I would let him rest for awhile before we attempted walking in the hall. I notified Dr. Loleta Books of the situation.  Orders received for Echo and Mag level add-on

## 2018-11-07 NOTE — Evaluation (Signed)
Physical Therapy Evaluation Patient Details Name: Alex [name]. MRN: [id number] DOB: January 22, 1941 Today's Date: 11/07/2018   History of Present Illness  78 year old male with history of amyloidosis, nephrotic syndrome, on immunosuppressive therapy and steroids, hypertension, coronary artery disease, hearing loss, cervical fusion, rt foot surgery x2, bil RTC repair, lt knee surgery, tigger finger releases was admitted to the hospital on 10/28/2018 with shortness of breath, was diagnosed with COVID-19 pneumonia and was admitted to the hospital. 7/11 +PE    Clinical Impression  Pt admitted with above diagnosis. Patient currently severely limited in his mobility due to his respiratory status. Desaturates into 70s while eating on 10L HFNC; after recovered desaturated to 81% on 12L stand-pivot to chair. He responds well to instructional cues for pursed lip breathing and able to slow his breathing rate. Pt currently with functional limitations due to the deficits listed below (see PT Problem List).  Pt will benefit from skilled PT to increase their independence and safety with mobility to allow discharge to the venue listed below.       Follow Up Recommendations No PT follow up    Equipment Recommendations  None recommended by PT    Recommendations for Other Services OT consult     Precautions / Restrictions Precautions Precautions: Fall      Mobility  Bed Mobility Overal bed mobility: Modified Independent                Transfers Overall transfer level: Needs assistance Equipment used: None Transfers: Sit to/from [employer] Transfers Sit to Stand: Min guard Stand pivot transfers: Min guard       General transfer comment: for safety, lines and vc for breathing during activity  Ambulation/Gait             General Gait Details: unable due to desaturation levels  Stairs            Wheelchair Mobility    Modified Rankin (Stroke Patients Only)        Balance Overall balance assessment: Mild deficits observed, not formally tested                                           Pertinent Vitals/Pain Pain Assessment: No/denies pain    Home Living Family/patient expects to be discharged to:: Private residence Living Arrangements: Spouse/significant other Available Help at Discharge: Family Type of Home: House Home Access: Stairs to enter Entrance Stairs-Rails: None Technical brewer of Steps: 1 Home Layout: One level Home Equipment: Environmental consultant - 2 wheels      Prior Function Level of Independence: Independent               Hand Dominance        Extremity/Trunk Assessment   Upper Extremity Assessment Upper Extremity Assessment: Generalized weakness    Lower Extremity Assessment Lower Extremity Assessment: Generalized weakness    Cervical / Trunk Assessment Cervical / Trunk Assessment: Kyphotic  Communication   Communication: Other (comment)(HOH; very dyspneic)  Cognition Arousal/Alertness: Awake/alert Behavior During Therapy: WFL for tasks assessed/performed Overall Cognitive Status: Within Functional Limits for tasks assessed                                 General Comments: cognition not formally tested; very dyspneic,folliowing all instructions      General Comments General comments (  skin integrity, edema, etc.): On arrival, sitting EOB eating breakfast on 10L HFNC with SpO2 probe off finger. Applied to his earlobe with sats 76-79% even with vc for pursed lip, deep breaths which pt did well. Increased to 12L and encouraged pt to stop eating and work on breathing and able to get to 82-84%. Each time he tried to eat or answer simple question, decr SpO2 to 80. Discussed benefits of prone positioning vs upright in chair. Noted RN was coming in to give pt meds so opted to go to recliner for incr trunk support. Once seated, continued pursed lip breathing with SpO2 increasing to 85%  within 3 minutes, then up to 92%. Ultimately up to 99% and decr O2 to 10 L with pt 88-91% on departure. Educated pt on rationale for pursed lip breathing and use of IS and frequency.     Exercises     Assessment/Plan    PT Assessment Patient needs continued PT services  PT Problem List Decreased strength;Decreased activity tolerance;Decreased mobility;Decreased knowledge of precautions;Cardiopulmonary status limiting activity       PT Treatment Interventions DME instruction;Gait training;Functional mobility training;Therapeutic activities;Therapeutic exercise;Patient/family education    PT Goals (Current goals can be found in the Care Plan section)  Acute Rehab PT Goals Patient Stated Goal: go home to his bride PT Goal Formulation: With patient Time For Goal Achievement: 11/21/18 Potential to Achieve Goals: Good    Frequency Min 3X/week   Barriers to discharge        Co-evaluation               AM-PAC PT "6 Clicks" Mobility  Outcome Measure Help needed turning from your back to your side while in a flat bed without using bedrails?: None Help needed moving from lying on your back to sitting on the side of a flat bed without using bedrails?: None Help needed moving to and from a bed to a chair (including a wheelchair)?: A Little Help needed standing up from a chair using your arms (e.g., wheelchair or bedside chair)?: A Little Help needed to walk in hospital room?: A Lot Help needed climbing 3-5 steps with a railing? : Total 6 Click Score: 17    End of Session Equipment Utilized During Treatment: Oxygen Activity Tolerance: Treatment limited secondary to medical complications (Comment)(dyspnea and decr sats) Patient left: in chair;with call bell/phone within reach;with nursing/sitter in room Nurse Communication: Mobility status;Other (comment)(decr eating EOB) PT Visit Diagnosis: Muscle weakness (generalized) (M62.81);Difficulty in walking, not elsewhere classified  (R26.2)    Time: 0071-2197 PT Time Calculation (min) (ACUTE ONLY): 44 min   Charges:   PT Evaluation $PT Eval Moderate Complexity: 1 Mod PT Treatments $Self Care/Home Management: 8-22          Rexanne Mano, PT 11/07/2018, 10:55 AM

## 2018-11-07 NOTE — Plan of Care (Signed)

## 2018-11-08 ENCOUNTER — Inpatient Hospital Stay (HOSPITAL_COMMUNITY): Payer: Non-veteran care

## 2018-11-08 ENCOUNTER — Other Ambulatory Visit: Payer: Self-pay

## 2018-11-08 ENCOUNTER — Inpatient Hospital Stay (HOSPITAL_COMMUNITY): Payer: Medicare HMO

## 2018-11-08 DIAGNOSIS — J9601 Acute respiratory failure with hypoxia: Secondary | ICD-10-CM

## 2018-11-08 LAB — BASIC METABOLIC PANEL
Anion gap: 12 (ref 5–15)
BUN: 49 mg/dL — ABNORMAL HIGH (ref 8–23)
CO2: 22 mmol/L (ref 22–32)
Calcium: 8.1 mg/dL — ABNORMAL LOW (ref 8.9–10.3)
Chloride: 104 mmol/L (ref 98–111)
Creatinine, Ser: 1.14 mg/dL (ref 0.61–1.24)
GFR calc Af Amer: >60 mL/min (ref >60)
GFR calc non Af Amer: >60 mL/min (ref >60)
Glucose, Bld: 115 mg/dL — ABNORMAL HIGH (ref 70–99)
Potassium: 4.4 mmol/L (ref 3.5–5.1)
Sodium: 138 mmol/L (ref 135–145)

## 2018-11-08 LAB — CBC
HCT: 40.8 % (ref 39.0–52.0)
Hemoglobin: 13.4 g/dL (ref 13.0–17.0)
MCH: 30.3 pg (ref 26.0–34.0)
MCHC: 32.8 g/dL (ref 30.0–36.0)
MCV: 92.3 fL (ref 80.0–100.0)
Platelets: 115 10*3/uL — ABNORMAL LOW (ref 150–400)
RBC: 4.42 MIL/uL (ref 4.22–5.81)
RDW: 15.4 % (ref 11.5–15.5)
WBC: 12.9 10*3/uL — ABNORMAL HIGH (ref 4.0–10.5)
nRBC: 0 % (ref 0.0–0.2)

## 2018-11-08 LAB — POCT I-STAT 7, (LYTES, BLD GAS, ICA,H+H)
Acid-Base Excess: 1 mmol/L (ref 0.0–2.0)
Bicarbonate: 22.7 mmol/L (ref 20.0–28.0)
Calcium, Ion: 1.13 mmol/L — ABNORMAL LOW (ref 1.15–1.40)
HCT: 40 % (ref 39.0–52.0)
Hemoglobin: 13.6 g/dL (ref 13.0–17.0)
O2 Saturation: 90 %
Patient temperature: 98.2
Potassium: 4 mmol/L (ref 3.5–5.1)
Sodium: 139 mmol/L (ref 135–145)
TCO2: 24 mmol/L (ref 22–32)
pCO2 arterial: 27.2 mmHg — ABNORMAL LOW (ref 32.0–48.0)
pH, Arterial: 7.528 — ABNORMAL HIGH (ref 7.350–7.450)
pO2, Arterial: 50 mmHg — ABNORMAL LOW (ref 83.0–108.0)

## 2018-11-08 LAB — GLUCOSE, CAPILLARY
Glucose-Capillary: 211 mg/dL — ABNORMAL HIGH (ref 70–99)
Glucose-Capillary: 170 mg/dL — ABNORMAL HIGH (ref 70–99)
Glucose-Capillary: 121 mg/dL — ABNORMAL HIGH (ref 70–99)
Glucose-Capillary: 112 mg/dL — ABNORMAL HIGH (ref 70–99)
Glucose-Capillary: 153 mg/dL — ABNORMAL HIGH (ref 70–99)
Glucose-Capillary: 115 mg/dL — ABNORMAL HIGH (ref 70–99)

## 2018-11-08 LAB — ECHOCARDIOGRAM LIMITED
Height: 70 in
Weight: 3252.23 oz

## 2018-11-08 LAB — MRSA PCR SCREENING: MRSA by PCR: NEGATIVE

## 2018-11-08 MED ORDER — CHLORHEXIDINE GLUCONATE CLOTH 2 % EX PADS
6.0000 | MEDICATED_PAD | Freq: Every day | CUTANEOUS | Status: DC
  Administered 2018-11-08 – 2018-11-14 (×6): 6 via TOPICAL

## 2018-11-08 MED ORDER — MORPHINE SULFATE (PF) 2 MG/ML IV SOLN
2.0000 mg | INTRAVENOUS | Status: DC | PRN
  Administered 2018-11-08 – 2018-11-09 (×6): 2 mg via INTRAVENOUS
  Filled 2018-11-08 (×7): qty 1

## 2018-11-08 NOTE — Progress Notes (Signed)
PROGRESS NOTE    Alex [name].  ZOX:[id number] DOB: [birth year] DOA: 10/28/2018 PCP: [name], MD      Brief Narrative:  [name] is a 78 y.o. M with hx amyloidosis, nephrotic syndrome baseline Cr 1.1-1.2, chronic thrombocytopenia, HTN and CAD who presented with SOB, cough found to have COVID requiring 4L at admission.    Assessment & Plan:  Coronavirus pneumonitis with acute hypoxic respiratory failure In setting of ongoing 2020 COVID-19 pandemic.  S/p remdesivir 7/6-7/10 S/p Actemra 7/11 S/p plasma 7/10  S/p steroids 7/6-7/15  O2 needs remain at 8-12L, quickly desaturates with eating, talking, exertion, but also quickly rebounds.  -Continue VTE PPx with Eliquis -Continue Zinc and Vitamin C     Pneumomediastinum Patient with brief run VT yesterday.  Occurred with BM.  This morning, patient describes new onset accelerating chest pain overnight since valsalva with BM.    K and Mag normal.  ECG this morning shows stable left axis, no new ischemic changes, no ectopy.    CT chest obtained, and this shows new pneumomediastinum.  Discussed with PCCM.   -Transfer to ICU -Supportive care -Avoid positive pressure intrathoracic -Will use heated high flow, this provides increased O2 delivery, gentle PEEP but unlikely to worsen pneumo -Morphine 2 mg IV PRN for pain control   Acute pulmonary embolism This was diagnosed 7/11, subsegmental in character.   Treated initially with heparin, transitioned to Eliquis.  First provoked PE.  CTA reviewed with Radiology and degree of hypoxia clearly out of proportion to small PE burden. -Continue Eliquis  Acute hypoxic respiratory failure Not cardiogenic.  Not from PE.  Suspect this is all post-COVID-ARDS hypoxia.  Pneumomediastinum contributing.  ABG 7/16 shows respiratory alkalosis, persistent pO2 50 mmHg. -Transfer to ICU for HHF -Follow echocardiogram  Amyloid disease -Continue acyclovir PPx  Hypertension Coronary  disease secondary prevention BP normal, low normal. -Continue aspirin, Lipitor, Zetia -Hold Lasix, Imdur, losartan -May have PRN hydralazine if rebound hypertension  Hyperglycemia -Continue SSI corrections  Other medicaitons -Continue PPI  Chronic thrombocytopenia No change  CKD III Cr stable at 1.1-1.3       MDM and disposition: The patient is critically ill with multi-organ failure.  Critical care was necessary to treat or prevent imminent or life-threatening deterioration of respiratory failure, cardiac failure, and was exclusive of separately billable procedures and treating other patients. Total critical care time spent by me: 55 minutes Time spent personally by me on obtaining history from patient or surrogate, evaluation of the patient, evaluation of patient's response to treatment, ordering and review of laboratory studies, ordering and review of radiographic studies, ordering and performing treatments and interventions, and re-evaluation of the patient's condition.        DVT prophylaxis: Eliquis Code Status: FULL Family Communication: Wife by phone    Consultants:   CCM  Procedures:   CTA 7/11  Antimicrobials:   None   Culture data:   7/5 blood culture x2 NGTD       Subjective: Very out of breath with minimal exertion.  Chest pain, constant and worsening this morning.  No other fever, confusion, sputum, hemoptysis.  No focal weakness, no passing out.     Objective: Vitals:   11/08/18 1215 11/08/18 1230 11/08/18 1307 11/08/18 1500  BP: 99/78 100/73    Pulse: 63 62  (!) 59  Resp: 16 17  15   Temp:      TempSrc:      SpO2: 100% 100% 100% 99%  Weight:      Height:        Intake/Output Summary (Last 24 hours) at 11/08/2018 1558 Last data filed at 11/08/2018 0756 Gross per 24 hour  Intake 125 ml  Output 1450 ml  Net -1325 ml   Filed Weights   10/29/18 0422  Weight: 92.2 kg    Examination: General appearance: Elderly adult male,  lying in bed, appears tired.   HEENT: Anicteric, conjunctival pink, lids and lashes normal, no nasal deformity, discharge, or epistaxis.  Lips moist, dentition good, oropharynx moist, no oral lesions.  Hearing good. Skin: Warm and dry, no suspicious rashes or lesions. Cardiac: Tachycardic, regular, normal Q9-U7, soft systolic murmur appreciated, capillary refill normal, JVP normal, no lower extremity edema. Respiratory: Tachypneic, no rales or wheezes, shallow breaths. Abdomen: Abdomen soft, no tenderness to palpation or guarding, no ascites, distention, hepatosplenomegaly.   MSK: No deformities or effusions Neuro: Awake and alert, extraocular movements intact, moves all extremities, speech fluent    Psych: Sensorium intact and responding to questions, attention normal. Affect normal.  Judgment and insight appear normal.      Data Reviewed: I have personally reviewed following labs and imaging studies:  CBC: Recent Labs  Lab 11/04/18 0133 11/05/18 0150 11/06/18 0215 11/07/18 0250 11/08/18 0500 11/08/18 0908  WBC 10.3 12.1* 14.7* 12.7* 12.9*  --   NEUTROABS  --  10.8*  --   --   --   --   HGB 11.6* 11.9* 11.9* 13.1 13.4 13.6  HCT 35.2* 36.5* 36.1* 38.6* 40.8 40.0  MCV 92.1 92.6 93.0 92.6 92.3  --   PLT 113* 116* 129* 119* 115*  --    Basic Metabolic Panel: Recent Labs  Lab 11/04/18 0133 11/05/18 0150 11/06/18 0215 11/07/18 0250 11/08/18 0500 11/08/18 0908  NA 139 139 138 139 138 139  K 4.0 4.6 4.5 4.3 4.4 4.0  CL 104 105 105 103 104  --   CO2 24 23 24 25 22   --   GLUCOSE 204* 197* 238* 118* 115*  --   BUN 48* 51* 49* 57* 49*  --   CREATININE 1.19 1.18 1.08 1.27* 1.14  --   CALCIUM 8.0* 8.1* 7.9* 8.1* 8.1*  --   MG  --   --   --  2.5*  --   --    GFR: Estimated Creatinine Clearance: 63.9 mL/min (by C-G formula based on SCr of 1.14 mg/dL). Liver Function Tests: Recent Labs  Lab 11/02/18 0430 11/04/18 0133 11/05/18 0150 11/06/18 0215 11/07/18 0250  AST 113*  50* 61* 52* 42*  ALT 127* 91* 100* 92* 80*  ALKPHOS 69 73 78 81 80  BILITOT 0.7 0.8 0.8 0.9 1.2  PROT 5.9* 5.7* 5.6* 5.3* 5.6*  ALBUMIN 2.6* 2.7* 2.6* 2.5* 2.6*   No results for input(s): LIPASE, AMYLASE in the last 168 hours. No results for input(s): AMMONIA in the last 168 hours. Coagulation Profile: Recent Labs  Lab 11/03/18 1500  INR 1.2   Cardiac Enzymes: No results for input(s): CKTOTAL, CKMB, CKMBINDEX, TROPONINI in the last 168 hours. BNP (last 3 results) No results for input(s): PROBNP in the last 8760 hours. HbA1C: No results for input(s): HGBA1C in the last 72 hours. CBG: Recent Labs  Lab 11/07/18 1643 11/07/18 2140 11/08/18 0632 11/08/18 0909 11/08/18 1117  GLUCAP 211* 170* 121* 112* 153*   Lipid Profile: No results for input(s): CHOL, HDL, LDLCALC, TRIG, CHOLHDL, LDLDIRECT in the last 72 hours. Thyroid Function Tests: No results for  input(s): TSH, T4TOTAL, FREET4, T3FREE, THYROIDAB in the last 72 hours. Anemia Panel: Recent Labs    11/06/18 0215 11/07/18 0250  FERRITIN 198 149   Urine analysis:    Component Value Date/Time   COLORURINE YELLOW 04/24/2018 [place] 04/24/2018 1042   LABSPEC 1.008 04/24/2018 1042   PHURINE 5.0 04/24/2018 1042   GLUCOSEU NEGATIVE 04/24/2018 1042   HGBUR MODERATE (A) 04/24/2018 1042   BILIRUBINUR NEGATIVE 04/24/2018 1042   KETONESUR NEGATIVE 04/24/2018 1042   PROTEINUR 30 (A) 04/24/2018 1042   NITRITE NEGATIVE 04/24/2018 1042   LEUKOCYTESUR NEGATIVE 04/24/2018 1042   Sepsis Labs: @LABRCNTIP (procalcitonin:4,lacticacidven:4)  ) Recent Results (from the past 240 hour(s))  MRSA PCR Screening     Status: None   Collection Time: 11/08/18 10:31 AM   Specimen: Nasal Mucosa; Nasopharyngeal  Result Value Ref Range Status   MRSA by PCR NEGATIVE NEGATIVE Final    Comment:        The GeneXpert MRSA Assay (FDA approved for NASAL specimens only), is one component of a comprehensive MRSA  colonization surveillance program. It is not intended to diagnose MRSA infection nor to guide or monitor treatment for MRSA infections. Performed at [facility], [place] [address] [place], [place] [address]          Radiology Studies: Ct Chest Wo Contrast  Result Date: 11/08/2018 CLINICAL DATA:  Pneumonia EXAM: CT CHEST WITHOUT CONTRAST TECHNIQUE: Multidetector CT imaging of the chest was performed following the standard protocol without IV contrast. COMPARISON:  CT chest, 11/03/2018 FINDINGS: Examination is limited by breath motion artifact throughout, particularly at the lung bases. Cardiovascular: Aortic atherosclerosis. Aortic valve calcifications. Normal heart size. Three-vessel coronary artery calcifications. No pericardial effusion. Mediastinum/Nodes: There is new pneumomediastinum. No enlarged mediastinal, hilar, or axillary lymph nodes. Thyroid gland, trachea, and esophagus demonstrate no significant findings. Lungs/Pleura: Evaluation of the lungs is significantly limited by breath motion artifact, however within this limitation, there is extensive bilateral, predominantly subpleural ground-glass and consolidative pulmonary opacity, significantly increased and more consolidated compared to examination dated 11/03/2018. No pleural effusion or pneumothorax. Upper Abdomen: No acute abnormality. Musculoskeletal: No chest wall mass or suspicious bone lesions identified. IMPRESSION: 1. Evaluation of the lungs is significantly limited by breath motion artifact, however within this limitation, there is extensive bilateral, predominantly subpleural ground-glass and consolidative pulmonary opacity, significantly increased and more consolidated compared to examination dated 11/03/2018. Findings remain consistent with COVID-19 pneumonia. 2. New pneumomediastinum. There is no obvious tracheal or bronchial defect noted. 3.  Coronary artery disease and aortic atherosclerosis.  Electronically Signed   By: Eddie Candle M.D.   On: 11/08/2018 10:27        Scheduled Meds:  acyclovir  400 mg Oral BID   apixaban  10 mg Oral BID   Followed by   Derrill Memo ON 11/11/2018] apixaban  5 mg Oral BID   aspirin EC  81 mg Oral Daily   atorvastatin  40 mg Oral QHS   Chlorhexidine Gluconate Cloth  6 each Topical Daily   cholecalciferol  1,000 Units Oral BID   ezetimibe  10 mg Oral Daily   folic acid  1 mg Oral QHS   guaiFENesin  1,200 mg Oral BID   insulin aspart  0-9 Units Subcutaneous TID WC   multivitamin with minerals  1 tablet Oral Daily   pantoprazole  80 mg Oral Daily   vitamin C  1,000 mg Oral BID   Continuous Infusions:   LOS: 10 days  Time spent: 55 minutes      Edwin Dada, MD [facility] 11/08/2018, 3:58 PM     Please page through [place]:  [email or web address] Password [email or web address] If 7PM-7AM, please contact night-coverage

## 2018-11-08 NOTE — Progress Notes (Signed)
  Echocardiogram 2D Echocardiogram has been performed.  Darlina Sicilian M 11/08/2018, 10:19 AM

## 2018-11-08 NOTE — Progress Notes (Signed)
OT Cancellation Note  Patient Details Name: Alex [name]. MRN: [id number] DOB: Aug 12, 1940   Cancelled Treatment:    Reason Eval/Treat Not Completed: Medical issues which prohibited therapy. Pt transferred to ICU. Will follow up when medically appropriate.   Ramond Dial, OT/L   Acute OT Clinical Specialist Acute Rehabilitation Services Pager [phone] Office [phone]  11/08/2018, 11:08 AM

## 2018-11-08 NOTE — Plan of Care (Signed)
  Problem: Respiratory: Goal: Will maintain a patent airway Outcome: Progressing   

## 2018-11-08 NOTE — Progress Notes (Signed)
Pt arrived to ICU bed 202-3. Pt transferred over to unit monitor and unit bed. Pt has some labored breathing. High Flow heated [place] started via RT. Pt reports already feeling better with this. Echo at the bedside. Pt remains with mild labored breathing but improving. No distress at this time.

## 2018-11-08 NOTE — Plan of Care (Signed)

## 2018-11-08 NOTE — Plan of Care (Signed)
  Problem: Education: Goal: Knowledge of risk factors and measures for prevention of condition will improve Outcome: Progressing   Problem: Coping: Goal: Psychosocial and spiritual needs will be supported Outcome: Progressing   Problem: Respiratory: Goal: Will maintain a patent airway 11/08/2018 0722 by Shanon Ace, RN Outcome: Progressing 11/08/2018 0722 by Shanon Ace, RN Outcome: Progressing Goal: Complications related to the disease process, condition or treatment will be avoided or minimized Outcome: Progressing   Problem: Education: Goal: Knowledge of General Education information will improve Description: Including pain rating scale, medication(s)/side effects and non-pharmacologic comfort measures Outcome: Progressing   Problem: Health Behavior/Discharge Planning: Goal: Ability to manage health-related needs will improve Outcome: Progressing   Problem: Clinical Measurements: Goal: Ability to maintain clinical measurements within normal limits will improve Outcome: Progressing Goal: Will remain free from infection Outcome: Progressing Goal: Diagnostic test results will improve Outcome: Progressing Goal: Respiratory complications will improve Outcome: Progressing Goal: Cardiovascular complication will be avoided Outcome: Progressing   Problem: Activity: Goal: Risk for activity intolerance will decrease Outcome: Progressing   Problem: Nutrition: Goal: Adequate nutrition will be maintained Outcome: Progressing   Problem: Coping: Goal: Level of anxiety will decrease Outcome: Progressing   Problem: Elimination: Goal: Will not experience complications related to bowel motility Outcome: Progressing Goal: Will not experience complications related to urinary retention Outcome: Progressing   Problem: Pain Managment: Goal: General experience of comfort will improve Outcome: Progressing   Problem: Safety: Goal: Ability to remain free from injury will  improve Outcome: Progressing   Problem: Skin Integrity: Goal: Risk for impaired skin integrity will decrease Outcome: Progressing

## 2018-11-08 NOTE — Care Management (Signed)
Case manager updated MD that patient will receive 30 day free supply of eliquis from our [place]. Patient's monthly co-pay will be $156.00.     Rudy Jew Case Manager  [phone]

## 2018-11-08 NOTE — Progress Notes (Signed)
At approximately 8AM Pt c/o chest pain to mid chest lower sternum area.  He advised it was pressure pain.  Denied n/v and radiating pain.  EKG ordered and completed per protocol.  MD notified of situation, and advised he was coming in to see the Pt.   MD then at bedside and ordered labs and CT scan, and transfer of Pt.  RT notified of lab, and completed.  MD and RT discussed POC for patient, and Transfer order place for Pt to go to ICU.  Echo tech arrived on unit as leaving for CT, advised we would meet him  Pt taken to CT for scan, and then Transfered to ICU.  Prior to leaving, the Pt's wife called into the room and Pt was taking to her. I explained to her why we were transferring the Pt, and where he was going.  The ICU nurse will be f/u with her later this AM.

## 2018-11-08 NOTE — Progress Notes (Signed)
PT Cancellation Note  Patient Details Name: Alex [name]. MRN: [id number] DOB: [birth year]   Cancelled Treatment:    Reason Eval/Treat Not Completed: Medical issues which prohibited therapy  Patient transferred to ICU with new pneumomediastinum. Will continue to follow.  Jeanie Cooks Alex [name], PT 11/08/2018, 1:44 PM

## 2018-11-08 NOTE — Progress Notes (Signed)
During handoff report, night RN reported that Pt continued to have multifocal PVC's and short run of SVT during the night.

## 2018-11-09 LAB — GLUCOSE, CAPILLARY
Glucose-Capillary: 143 mg/dL — ABNORMAL HIGH (ref 70–99)
Glucose-Capillary: 113 mg/dL — ABNORMAL HIGH (ref 70–99)

## 2018-11-09 MED ORDER — PSYLLIUM 95 % PO PACK
1.0000 | PACK | Freq: Every day | ORAL | Status: DC
  Administered 2018-11-09 – 2018-11-17 (×9): 1 via ORAL
  Filled 2018-11-09 (×10): qty 1

## 2018-11-09 MED ORDER — ENSURE ENLIVE PO LIQD
237.0000 mL | Freq: Two times a day (BID) | ORAL | Status: DC
  Administered 2018-11-09 – 2018-11-17 (×11): 237 mL via ORAL
  Filled 2018-11-09 (×11): qty 237

## 2018-11-09 MED ORDER — DOCUSATE SODIUM 100 MG PO CAPS
100.0000 mg | ORAL_CAPSULE | Freq: Every day | ORAL | Status: DC
  Administered 2018-11-10 – 2018-11-17 (×8): 100 mg via ORAL
  Filled 2018-11-09 (×8): qty 1

## 2018-11-09 MED ORDER — ENSURE MAX PROTEIN PO LIQD
11.0000 [oz_av] | Freq: Every day | ORAL | Status: DC
  Administered 2018-11-11 – 2018-11-17 (×5): 11 [oz_av] via ORAL
  Filled 2018-11-09 (×10): qty 330

## 2018-11-09 NOTE — Progress Notes (Signed)
PT Cancellation Note  Patient Details Name: Alex [name]. MRN: [id number] DOB: 01-14-1941   Cancelled Treatment:    Reason Eval/Treat Not Completed: Medical issues which prohibited therapy, tolerates very little activity. Nursing mobilizing pt. Today. Will check back another time.   Claretha Cooper 11/09/2018, 5:17 PM

## 2018-11-09 NOTE — Progress Notes (Signed)
Pt taken off heated HFNC and placed on 13L [place] HFNC. VS within normal limits. Pt in no distress, no increased WOB, denies SOB. RN aware. RT will continue to monitor for possible higher O2 needs.

## 2018-11-09 NOTE — Progress Notes (Signed)
Updates given to pt's spouse during Facetime call with pt.  Plan of care discussed.  All questions welcomed and answered.

## 2018-11-09 NOTE — Progress Notes (Signed)
Assisted patient with transfer from bed to chair.  Pt becomes extremely SOB with minimal activity.  O2 returned to 15L HFNC for activity. Desat to 50's. Long recovery period. MD informed.  Will continue to monitor closely.

## 2018-11-09 NOTE — Progress Notes (Signed)
OT Cancellation Note  Patient Details Name: Alex [name]. MRN: [id number] DOB: Oct 08, 1940   Cancelled Treatment:    Reason Eval/Treat Not Completed: Other (comment)(Tranferring down to step down unit, will continue to follow)  Jaci Carrel 11/09/2018, 3:45 PM   Hulda Humphrey OTR/L [place] Pager: [phone] Office: [phone]

## 2018-11-09 NOTE — Progress Notes (Signed)
PROGRESS NOTE    Alex [name].  KDT:[id number] DOB: 1940/06/13 DOA: 10/28/2018 PCP: [name], MD      Brief Narrative:  [name] is a 78 y.o. M with hx amyloidosis, nephrotic syndrome baseline Cr 1.1-1.2, chronic thrombocytopenia, HTN and CAD who presented with SOB, cough found to have COVID requiring 4L at admission.    Assessment & Plan:  Coronavirus pneumonitis with acute hypoxic respiratory failure In setting of ongoing 2020 COVID-19 pandemic.  S/p remdesivir 7/6-7/10 S/p Actemra 7/11 S/p plasma 7/10  S/p steroids 7/6-7/15  Transferred to ICU 7/16 due to chest pain, hypoxia and newly diagnosed pneumomediastinum.  Overnight, weaned to 25L, this morning down to salter and 13L HFNC.    This morning, he is comfortable and saturating well on 6L salter.  -Transfer to progressive care  -Continue VTE PPx with Eliquis -Continue Zinc and Vitamin C     Pneumomediastinum Patient developed ectopy, increased work of breathing/dyspnea and chest pain after BM 7/15.  7/16, pain worsened, K and mag normal, ECG normal but CT chest showed new pneumomediastinum.  Transferred to ICU.  -Supportive care -Avoid positive pressure intrathoracic -Morphine 2 mg IV PRN for pain control -Start docusate, avoid valsalva   Acute pulmonary embolism This was diagnosed 7/11, subsegmental in character.   Treated initially with heparin, transitioned to Eliquis.  First provoked PE.  CTA reviewed with Radiology and degree of hypoxia clearly out of proportion to small PE burden. -Continue Eliquis  Acute hypoxic respiratory failure Not cardiogenic.  Not from PE.  Suspect this is all post-COVID-ARDS hypoxia.  Pneumomediastinum contributing.  ABG 7/16 shows respiratory alkalosis, persistent pO2 50 mmHg.  Echo yesterday showed normal EF, known mild to moderate AS.  Amyloid disease -Continue acyclovir PPx  Hypertension Coronary disease secondary prevention BP normal -Continue  aspirin, Lipitor, Zetia -Hold Lasix, Imdur, losartan -May have PRN hydralazine if rebound hypertension  Hyperglycemia Hyperglycemia resolved.  No prior history diabetes -D/c SSI corrections and CBGs   Other medications -Continue PPI  Chronic thrombocytopenia Stable  CKD III Stable       MDM and disposition: The above labs and imaging studies were reviewed and summarized above.  Medication management as above.  The patient was admitted with severe acute hypoxic respiratory failure.  He developed venous thrombo-embolism, for which she was started on blood thinner.  In the last 48 hours, he developed pneumomediastinum and worsening respiratory failure, for which he was transferred to the ICU.  Overnight, his respiratory status stabilized, he was weaned back to 6 L nasal cannula, and has been comfortable throughout the morning without chest pain.          DVT prophylaxis: Eliquis Code Status: FULL Family Communication: Wife by phone    Consultants:   CCM  Procedures:   CTA 7/11  CT chest w/o contrast 7/16  Echo 7/16 IMPRESSIONS    1. The left ventricle has normal systolic function, with an ejection fraction of 60-65%. The cavity size was normal. There is mildly increased left ventricular wall thickness. Left ventricular diastolic function could not be evaluated.  2. Left atrial size was not well visualized.  3. The aortic valve was not well visualized.  4. Limited echo no doppler poorly visualized known mild to moderate AS with mean gradient 19 mmHg September 2019.  5. The interatrial septum was not well visualized.     Antimicrobials:   None   Culture data:   7/5 blood culture x2 NGTD  Subjective: Slept well this morning.  Chest pain resolved.  Breathing more comfortable, no cough.  No focal weakness.  No confusion.  No new fever.      Objective: Vitals:   11/09/18 1040 11/09/18 1100 11/09/18 1108 11/09/18 1200  BP:  109/81  109/81 131/76  Pulse: 73 69 70 72  Resp: 16 14 16 17   Temp:    98.6 F (37 C)  TempSrc:    Oral  SpO2: 91% 91% 93% 93%  Weight:      Height:        Intake/Output Summary (Last 24 hours) at 11/09/2018 1303 Last data filed at 11/09/2018 0600 Gross per 24 hour  Intake 880 ml  Output 1150 ml  Net -270 ml   Filed Weights   10/29/18 0422  Weight: 92.2 kg    Examination: General appearance: Elderly adult male, sleeping, easily arousable, oriented and interactive. HEENT: Anicteric, conjunctival slightly injected, lids and lashes normal, no nasal deformity, discharge, or epistaxis.  Lips moist, dentition normal, oropharynx moist, no oral lesions, hearing good. Skin: Warm and dry, no suspicious rashes or lesions. Cardiac: RRR, no gallops, soft systolic murmur, no lower extremity edema, JVP normal. Respiratory: Respirations easy and unlabored, no rales or wheezes. Abdomen: Abdomen soft without tenderness palpation, guarding, ascites, distention or hepatosplenomegaly..   MSK: No deformities or effusions Neuro: Awake and alert, extraocular movements intact, moves all extremities with normal strength coordination, speech fluent. Psych: Sensorium intact and responding to questions, attention normal, affect normal, judgment insight appear normal.      Data Reviewed: I have personally reviewed following labs and imaging studies:  CBC: Recent Labs  Lab 11/04/18 0133 11/05/18 0150 11/06/18 0215 11/07/18 0250 11/08/18 0500 11/08/18 0908  WBC 10.3 12.1* 14.7* 12.7* 12.9*  --   NEUTROABS  --  10.8*  --   --   --   --   HGB 11.6* 11.9* 11.9* 13.1 13.4 13.6  HCT 35.2* 36.5* 36.1* 38.6* 40.8 40.0  MCV 92.1 92.6 93.0 92.6 92.3  --   PLT 113* 116* 129* 119* 115*  --    Basic Metabolic Panel: Recent Labs  Lab 11/04/18 0133 11/05/18 0150 11/06/18 0215 11/07/18 0250 11/08/18 0500 11/08/18 0908  NA 139 139 138 139 138 139  K 4.0 4.6 4.5 4.3 4.4 4.0  CL 104 105 105 103 104  --   CO2  24 23 24 25 22   --   GLUCOSE 204* 197* 238* 118* 115*  --   BUN 48* 51* 49* 57* 49*  --   CREATININE 1.19 1.18 1.08 1.27* 1.14  --   CALCIUM 8.0* 8.1* 7.9* 8.1* 8.1*  --   MG  --   --   --  2.5*  --   --    GFR: Estimated Creatinine Clearance: 63.9 mL/min (by C-G formula based on SCr of 1.14 mg/dL). Liver Function Tests: Recent Labs  Lab 11/04/18 0133 11/05/18 0150 11/06/18 0215 11/07/18 0250  AST 50* 61* 52* 42*  ALT 91* 100* 92* 80*  ALKPHOS 73 78 81 80  BILITOT 0.8 0.8 0.9 1.2  PROT 5.7* 5.6* 5.3* 5.6*  ALBUMIN 2.7* 2.6* 2.5* 2.6*   No results for input(s): LIPASE, AMYLASE in the last 168 hours. No results for input(s): AMMONIA in the last 168 hours. Coagulation Profile: Recent Labs  Lab 11/03/18 1500  INR 1.2   Cardiac Enzymes: No results for input(s): CKTOTAL, CKMB, CKMBINDEX, TROPONINI in the last 168 hours. BNP (last 3 results) No  results for input(s): PROBNP in the last 8760 hours. HbA1C: No results for input(s): HGBA1C in the last 72 hours. CBG: Recent Labs  Lab 11/08/18 0909 11/08/18 1117 11/08/18 1634 11/08/18 2154 11/09/18 0741  GLUCAP 112* 153* 115* 143* 113*   Lipid Profile: No results for input(s): CHOL, HDL, LDLCALC, TRIG, CHOLHDL, LDLDIRECT in the last 72 hours. Thyroid Function Tests: No results for input(s): TSH, T4TOTAL, FREET4, T3FREE, THYROIDAB in the last 72 hours. Anemia Panel: Recent Labs    11/07/18 0250  FERRITIN 149   Urine analysis:    Component Value Date/Time   COLORURINE YELLOW 04/24/2018 [place] 04/24/2018 1042   LABSPEC 1.008 04/24/2018 1042   PHURINE 5.0 04/24/2018 1042   GLUCOSEU NEGATIVE 04/24/2018 1042   HGBUR MODERATE (A) 04/24/2018 1042   BILIRUBINUR NEGATIVE 04/24/2018 1042   KETONESUR NEGATIVE 04/24/2018 1042   PROTEINUR 30 (A) 04/24/2018 1042   NITRITE NEGATIVE 04/24/2018 1042   LEUKOCYTESUR NEGATIVE 04/24/2018 1042   Sepsis  Labs: @LABRCNTIP (procalcitonin:4,lacticacidven:4)  ) Recent Results (from the past 240 hour(s))  MRSA PCR Screening     Status: None   Collection Time: 11/08/18 10:31 AM   Specimen: Nasal Mucosa; Nasopharyngeal  Result Value Ref Range Status   MRSA by PCR NEGATIVE NEGATIVE Final    Comment:        The GeneXpert MRSA Assay (FDA approved for NASAL specimens only), is one component of a comprehensive MRSA colonization surveillance program. It is not intended to diagnose MRSA infection nor to guide or monitor treatment for MRSA infections. Performed at [facility], [place] [address], [place], [place] [address]          Radiology Studies: Ct Chest Wo Contrast  Result Date: 11/08/2018 CLINICAL DATA:  Pneumonia EXAM: CT CHEST WITHOUT CONTRAST TECHNIQUE: Multidetector CT imaging of the chest was performed following the standard protocol without IV contrast. COMPARISON:  CT chest, 11/03/2018 FINDINGS: Examination is limited by breath motion artifact throughout, particularly at the lung bases. Cardiovascular: Aortic atherosclerosis. Aortic valve calcifications. Normal heart size. Three-vessel coronary artery calcifications. No pericardial effusion. Mediastinum/Nodes: There is new pneumomediastinum. No enlarged mediastinal, hilar, or axillary lymph nodes. Thyroid gland, trachea, and esophagus demonstrate no significant findings. Lungs/Pleura: Evaluation of the lungs is significantly limited by breath motion artifact, however within this limitation, there is extensive bilateral, predominantly subpleural ground-glass and consolidative pulmonary opacity, significantly increased and more consolidated compared to examination dated 11/03/2018. No pleural effusion or pneumothorax. Upper Abdomen: No acute abnormality. Musculoskeletal: No chest wall mass or suspicious bone lesions identified. IMPRESSION: 1. Evaluation of the lungs is significantly limited by breath motion artifact,  however within this limitation, there is extensive bilateral, predominantly subpleural ground-glass and consolidative pulmonary opacity, significantly increased and more consolidated compared to examination dated 11/03/2018. Findings remain consistent with COVID-19 pneumonia. 2. New pneumomediastinum. There is no obvious tracheal or bronchial defect noted. 3.  Coronary artery disease and aortic atherosclerosis. Electronically Signed   By: Eddie Candle M.D.   On: 11/08/2018 10:27        Scheduled Meds:  acyclovir  400 mg Oral BID   apixaban  10 mg Oral BID   Followed by   Derrill Memo ON 11/11/2018] apixaban  5 mg Oral BID   aspirin EC  81 mg Oral Daily   atorvastatin  40 mg Oral QHS   Chlorhexidine Gluconate Cloth  6 each Topical Daily   cholecalciferol  1,000 Units Oral BID   ezetimibe  10 mg Oral Daily  folic acid  1 mg Oral QHS   guaiFENesin  1,200 mg Oral BID   multivitamin with minerals  1 tablet Oral Daily   pantoprazole  80 mg Oral Daily   psyllium  1 packet Oral Daily   vitamin C  1,000 mg Oral BID   Continuous Infusions:   LOS: 11 days    Time spent: 35 minutes      Edwin Dada, MD [facility] 11/09/2018, 1:03 PM     Please page through [place]:  [email or web address] Password [email or web address] If 7PM-7AM, please contact night-coverage

## 2018-11-10 ENCOUNTER — Inpatient Hospital Stay (HOSPITAL_COMMUNITY): Payer: Non-veteran care

## 2018-11-10 LAB — CBC
HCT: 44 % (ref 39.0–52.0)
Hemoglobin: 14.8 g/dL (ref 13.0–17.0)
MCH: 31.3 pg (ref 26.0–34.0)
MCHC: 33.6 g/dL (ref 30.0–36.0)
MCV: 93 fL (ref 80.0–100.0)
Platelets: 130 10*3/uL — ABNORMAL LOW (ref 150–400)
RBC: 4.73 MIL/uL (ref 4.22–5.81)
RDW: 15.6 % — ABNORMAL HIGH (ref 11.5–15.5)
WBC: 14.1 10*3/uL — ABNORMAL HIGH (ref 4.0–10.5)
nRBC: 0 % (ref 0.0–0.2)

## 2018-11-10 LAB — COMPREHENSIVE METABOLIC PANEL
ALT: 55 U/L — ABNORMAL HIGH (ref 0–44)
AST: 35 U/L (ref 15–41)
Albumin: 2.8 g/dL — ABNORMAL LOW (ref 3.5–5.0)
Alkaline Phosphatase: 81 U/L (ref 38–126)
Anion gap: 11 (ref 5–15)
BUN: 33 mg/dL — ABNORMAL HIGH (ref 8–23)
CO2: 24 mmol/L (ref 22–32)
Calcium: 8 mg/dL — ABNORMAL LOW (ref 8.9–10.3)
Chloride: 102 mmol/L (ref 98–111)
Creatinine, Ser: 1.11 mg/dL (ref 0.61–1.24)
GFR calc Af Amer: >60 mL/min (ref >60)
GFR calc non Af Amer: >60 mL/min (ref >60)
Glucose, Bld: 134 mg/dL — ABNORMAL HIGH (ref 70–99)
Potassium: 4.1 mmol/L (ref 3.5–5.1)
Sodium: 137 mmol/L (ref 135–145)
Total Bilirubin: 1 mg/dL (ref 0.3–1.2)
Total Protein: 5.8 g/dL — ABNORMAL LOW (ref 6.5–8.1)

## 2018-11-10 MED ORDER — HYDROXYCHLOROQUINE SULFATE 200 MG PO TABS
400.0000 mg | ORAL_TABLET | Freq: Two times a day (BID) | ORAL | Status: AC
  Administered 2018-11-10 (×2): 400 mg via ORAL
  Filled 2018-11-10 (×2): qty 2

## 2018-11-10 MED ORDER — HYDROXYCHLOROQUINE SULFATE 200 MG PO TABS
200.0000 mg | ORAL_TABLET | Freq: Two times a day (BID) | ORAL | Status: AC
  Administered 2018-11-11 – 2018-11-14 (×8): 200 mg via ORAL
  Filled 2018-11-10 (×8): qty 1

## 2018-11-10 NOTE — Plan of Care (Signed)
Pt is not having any trouble sleeping. Denies pain and shortness of breath.

## 2018-11-10 NOTE — Progress Notes (Signed)
This RN assisted patient to facetime wife and talk with son on the phone.

## 2018-11-10 NOTE — Progress Notes (Signed)
PROGRESS NOTE    [name].  QPY:[id number] DOB: 05/25/1940 DOA: 10/28/2018 PCP: [name], MD      Brief Narrative:  [name] is a 78 y.o. M with hx primary amyloidosis s/p autologous stem cell transplant in 2018 at [facility], on maintenance Ninlaro, nephrotic syndrome baseline Cr 1.1-1.2, chronic thrombocytopenia, HTN and CAD who presented with SOB, cough found to have COVID requiring 4L at admission.    Assessment & Plan:  Coronavirus pneumonitis with acute hypoxic respiratory failure Acute hypoxic respiratory failure In setting of ongoing 2020 COVID-19 pandemic.  Admitted 7/5 S/p remdesivir 7/6-7/10 S/p steroids 7/6-7/15 S/p Actemra 7/11, administered prior to my taking over care of patient S/p plasma 7/10   Transferred to ICU 7/16 due to chest pain, hypoxia and newly diagnosed pneumomediastinum.    Echo 7/16 after development of pneumomediastinum showed normal EF, known mild to moderate AS, no effusion.  Overnight, O2 sats mid-80s to mid-90s with 15L HFNC.  Desats substantially with minimal exertion.  Syncopized this AM just standing at urinal.    The patient asked today about Plaquenil.  My opinion is htat his current respiratory status is driven largely by completed damage from CoV as well as his pneumomediastinum, not ongoing virus damage and that Plaquenil will have no effect.  This was reviewed with patient, the uncertainty of its benefit for COVID in general, the fact that he has no alternative experimental options available after enrolling in plasma study, and the potential side effects of Plaquenil including QT prolongation/arrhythmia and liver injury, but he wishes to proceed anyway.  -Start Plaquenil -ECG daily  -Continue VTE PPx with Eliquis -Continue Zinc and Vitamin C     Pneumomediastinum Patient developed ectopy, increased work of breathing/dyspnea and chest pain after BM 7/15.  7/16, pain worsened, K and mag normal, ECG normal but CT  chest showed new pneumomediastinum.  Transferred to ICU.  Repeat CXR today personally reviewed; pneumomediastinum noted, no pneumothorax.  Pneumonia at bilateral bases dense. -Supportive care -Avoid positive intrathoracic pressure -Continue Morphine 2 mg IV PRN for pain control -Continue docusate, avoid valsalva   Acute pulmonary embolism This was diagnosed 7/11, subsegmental in character.   Treated initially with heparin, transitioned to Eliquis.  First provoked PE.  CTA reviewed with Radiology and degree of hypoxia clearly out of proportion to small PE burden. -Continue Eliquis  History primary amyloidosis on Ninlaro S/p autologous stemm cell transplant 2018 Had been on maintenance Ninlaro (monoclonal Ab proteasome inhibitor, promotes clonal myeloma cell apoptosis, some immunomodulatory/immunesuppressant activity) -Continue acyclovir PPx -Hold Ninlaro  Hypertension Coronary disease secondary prevention BP normal -Continue aspirin, Lipitor, Zetia -Hold Lasix, Imdur, losartan -May have PRN hydralazine if rebound hypertension  Hyperglycemia Hyperglycemia resolved.  No prior history diabetes  Other medications -Continue PPI  Chronic thrombocytopenia Stable  CKD III Stable       MDM and disposition: The patient is critically ill with multi-organ failure.  Critical care was necessary to treat or prevent imminent or life-threatening deterioration of respiratory failure and was exclusive of separately billable procedures and treating other patients. Total critical care time spent by me: 45 minutes Time spent personally by me on obtaining history from patient or surrogate, evaluation of the patient, evaluation of patient's response to treatment, ordering and review of laboratory studies, ordering and review of radiographic studies, ordering and performing treatments and interventions, and re-evaluation of the patient's condition.            DVT prophylaxis:  Eliquis  Code Status: FULL Family Communication: Wife by phone    Consultants:   CCM  Procedures:   CTA 7/11  CT chest w/o contrast 7/16  Echo 7/16 IMPRESSIONS    1. The left ventricle has normal systolic function, with an ejection fraction of 60-65%. The cavity size was normal. There is mildly increased left ventricular wall thickness. Left ventricular diastolic function could not be evaluated.  2. Left atrial size was not well visualized.  3. The aortic valve was not well visualized.  4. Limited echo no doppler poorly visualized known mild to moderate AS with mean gradient 19 mmHg September 2019.  5. The interatrial septum was not well visualized.     Antimicrobials:   None   Culture data:   7/5 blood culture x2 NGTD       Subjective: Chest pain is improved.  Very dyspneic, generalized malaise is noted.  No fever, no sputum, no hemoptysis.  Syncopized with standing this morning.  No abdominal pain, extremity pain.    Objective: Vitals:   11/10/18 0653 11/10/18 0700 11/10/18 0701 11/10/18 0746  BP:   (!) 155/90   Pulse: 65 65  78  Resp: (!) 23 20  16   Temp:      TempSrc:      SpO2: (!) 82% (!) 88% (!) 89% 96%  Weight:      Height:        Intake/Output Summary (Last 24 hours) at 11/10/2018 1118 Last data filed at 11/10/2018 0645 Gross per 24 hour  Intake --  Output 1000 ml  Net -1000 ml   Filed Weights   10/29/18 0422  Weight: 92.2 kg    Examination: General appearance: Elderly adult male, lying in bed, interactive, appears tired. HEENT: Anicteric, conjunctival pink, lids and lashes normal.  No nasal deformity, discharge, or epistaxis.  Lips moist, dentition normal, oropharynx moist, no oral lesions, hearing normal. Skin: Warm and dry, no suspicious rashes or lesions. Cardiac: Regular rate and rhythm, soft systolic murmur, JVP normal, no lower extremity edema. Respiratory: Respirations shallow, somewhat fast, no nasal flaring or grunting.   No rales or wheezes, lung sounds diminished bilaterally. Abdomen: Abdomen soft without tenderness to palpation, guarding, ascites, distention, or hepatosplenomegaly. MSK: No deformities or effusions of the large joints of the upper lower extremities bilaterally. Neuro: Awake and alert, extraocular movements intact, moves all extremities with globally weak strength but normal coordination, speech fluent. Psych: Mild psychomotor slowing, attention normal, affect blunted, judgment insight appear normal.         Data Reviewed: I have personally reviewed following labs and imaging studies:  CBC: Recent Labs  Lab 11/04/18 0133 11/05/18 0150 11/06/18 0215 11/07/18 0250 11/08/18 0500 11/08/18 0908  WBC 10.3 12.1* 14.7* 12.7* 12.9*  --   NEUTROABS  --  10.8*  --   --   --   --   HGB 11.6* 11.9* 11.9* 13.1 13.4 13.6  HCT 35.2* 36.5* 36.1* 38.6* 40.8 40.0  MCV 92.1 92.6 93.0 92.6 92.3  --   PLT 113* 116* 129* 119* 115*  --    Basic Metabolic Panel: Recent Labs  Lab 11/04/18 0133 11/05/18 0150 11/06/18 0215 11/07/18 0250 11/08/18 0500 11/08/18 0908  NA 139 139 138 139 138 139  K 4.0 4.6 4.5 4.3 4.4 4.0  CL 104 105 105 103 104  --   CO2 24 23 24 25 22   --   GLUCOSE 204* 197* 238* 118* 115*  --   BUN 48* 51* 49*  57* 49*  --   CREATININE 1.19 1.18 1.08 1.27* 1.14  --   CALCIUM 8.0* 8.1* 7.9* 8.1* 8.1*  --   MG  --   --   --  2.5*  --   --    GFR: Estimated Creatinine Clearance: 63.9 mL/min (by C-G formula based on SCr of 1.14 mg/dL). Liver Function Tests: Recent Labs  Lab 11/04/18 0133 11/05/18 0150 11/06/18 0215 11/07/18 0250  AST 50* 61* 52* 42*  ALT 91* 100* 92* 80*  ALKPHOS 73 78 81 80  BILITOT 0.8 0.8 0.9 1.2  PROT 5.7* 5.6* 5.3* 5.6*  ALBUMIN 2.7* 2.6* 2.5* 2.6*   No results for input(s): LIPASE, AMYLASE in the last 168 hours. No results for input(s): AMMONIA in the last 168 hours. Coagulation Profile: Recent Labs  Lab 11/03/18 1500  INR 1.2   Cardiac  Enzymes: No results for input(s): CKTOTAL, CKMB, CKMBINDEX, TROPONINI in the last 168 hours. BNP (last 3 results) No results for input(s): PROBNP in the last 8760 hours. HbA1C: No results for input(s): HGBA1C in the last 72 hours. CBG: Recent Labs  Lab 11/08/18 0909 11/08/18 1117 11/08/18 1634 11/08/18 2154 11/09/18 0741  GLUCAP 112* 153* 115* 143* 113*   Lipid Profile: No results for input(s): CHOL, HDL, LDLCALC, TRIG, CHOLHDL, LDLDIRECT in the last 72 hours. Thyroid Function Tests: No results for input(s): TSH, T4TOTAL, FREET4, T3FREE, THYROIDAB in the last 72 hours. Anemia Panel: No results for input(s): VITAMINB12, FOLATE, FERRITIN, TIBC, IRON, RETICCTPCT in the last 72 hours. Urine analysis:    Component Value Date/Time   COLORURINE YELLOW 04/24/2018 [place] 04/24/2018 1042   LABSPEC 1.008 04/24/2018 1042   PHURINE 5.0 04/24/2018 1042   GLUCOSEU NEGATIVE 04/24/2018 1042   HGBUR MODERATE (A) 04/24/2018 1042   BILIRUBINUR NEGATIVE 04/24/2018 1042   KETONESUR NEGATIVE 04/24/2018 1042   PROTEINUR 30 (A) 04/24/2018 1042   NITRITE NEGATIVE 04/24/2018 1042   LEUKOCYTESUR NEGATIVE 04/24/2018 1042   Sepsis Labs: @LABRCNTIP (procalcitonin:4,lacticacidven:4)  ) Recent Results (from the past 240 hour(s))  MRSA PCR Screening     Status: None   Collection Time: 11/08/18 10:31 AM   Specimen: Nasal Mucosa; Nasopharyngeal  Result Value Ref Range Status   MRSA by PCR NEGATIVE NEGATIVE Final    Comment:        The GeneXpert MRSA Assay (FDA approved for NASAL specimens only), is one component of a comprehensive MRSA colonization surveillance program. It is not intended to diagnose MRSA infection nor to guide or monitor treatment for MRSA infections. Performed at [facility], [place] [address] [place], [place] [address]          Radiology Studies: Dg Chest Port 1 View  Result Date: 11/10/2018 CLINICAL DATA:  Follow-up viral  pneumonia in pneumomediastinum. EXAM: PORTABLE CHEST 1 VIEW COMPARISON:  11/08/2018 CT.  11/03/2018 radiography. FINDINGS: Mild cardiomegaly. Chronic aortic atherosclerosis. Bilateral patchy pulmonary infiltrates in the mid and lower lungs, worsened since the previous chest radiograph. Pneumomediastinum remains visible. No evidence of pneumothorax. No pleural effusion. No significant bone finding. IMPRESSION: Bilateral mid and lower lung pulmonary infiltrates consistent with viral pneumonia, worsened since the radiograph 1 week ago. Pneumomediastinum. No pneumothorax. Electronically Signed   By: [name] Chimes M.D.   On: 11/10/2018 09:52        Scheduled Meds:  acyclovir  400 mg Oral BID   apixaban  10 mg Oral BID   Followed by   Derrill Memo ON 11/11/2018] apixaban  5  mg Oral BID   aspirin EC  81 mg Oral Daily   atorvastatin  40 mg Oral QHS   Chlorhexidine Gluconate Cloth  6 each Topical Daily   cholecalciferol  1,000 Units Oral BID   docusate sodium  100 mg Oral Daily   ezetimibe  10 mg Oral Daily   feeding supplement (ENSURE ENLIVE)  237 mL Oral BID BM   folic acid  1 mg Oral QHS   guaiFENesin  1,200 mg Oral BID   hydroxychloroquine  400 mg Oral BID   Followed by   Derrill Memo ON 11/11/2018] hydroxychloroquine  200 mg Oral BID   multivitamin with minerals  1 tablet Oral Daily   pantoprazole  80 mg Oral Daily   Ensure Max Protein  11 oz Oral Daily   psyllium  1 packet Oral Daily   vitamin C  1,000 mg Oral BID   Continuous Infusions:   LOS: 12 days    Time spent: 45 minutes      Edwin Dada, MD [facility] 11/10/2018, 11:18 AM     Please page through [place]:  [email or web address] Password [email or web address] If 7PM-7AM, please contact night-coverage

## 2018-11-10 NOTE — Progress Notes (Signed)
Pt was moved from room 203-4 to 204-1 at 0615. At 0645 the pt stated he had to void so RN assisted the pt with the urinal. Pt stood next to the bed and voided 300 ml. Then he sat down on the side of the bed and stopped responding to the RN. The pt had a blank stare and was incontinent of a large amount on the floor. Sp02 was 70% on the 15L HiFlo O2. RN called for help and pt was assisted back in the bed. Pt started responding when he was back in bed. Pt denies pain. He said that he just felt really weak and couldn't remember going unconscious. Neurologically the pt has equal grips and speech is clear. Pt agreed that he cannot stand next to the bed to void at this time. It took 10 minutes for the pt to recover his Sp02 >89%.

## 2018-11-11 ENCOUNTER — Other Ambulatory Visit: Payer: Self-pay

## 2018-11-11 LAB — COMPREHENSIVE METABOLIC PANEL
ALT: 46 U/L — ABNORMAL HIGH (ref 0–44)
AST: 29 U/L (ref 15–41)
Albumin: 2.7 g/dL — ABNORMAL LOW (ref 3.5–5.0)
Alkaline Phosphatase: 73 U/L (ref 38–126)
Anion gap: 9 (ref 5–15)
BUN: 32 mg/dL — ABNORMAL HIGH (ref 8–23)
CO2: 24 mmol/L (ref 22–32)
Calcium: 8 mg/dL — ABNORMAL LOW (ref 8.9–10.3)
Chloride: 106 mmol/L (ref 98–111)
Creatinine, Ser: 1.05 mg/dL (ref 0.61–1.24)
GFR calc Af Amer: >60 mL/min (ref >60)
GFR calc non Af Amer: >60 mL/min (ref >60)
Glucose, Bld: 117 mg/dL — ABNORMAL HIGH (ref 70–99)
Potassium: 4.1 mmol/L (ref 3.5–5.1)
Sodium: 139 mmol/L (ref 135–145)
Total Bilirubin: 1.2 mg/dL (ref 0.3–1.2)
Total Protein: 5.5 g/dL — ABNORMAL LOW (ref 6.5–8.1)

## 2018-11-11 LAB — CBC
HCT: 42.6 % (ref 39.0–52.0)
Hemoglobin: 13.9 g/dL (ref 13.0–17.0)
MCH: 30.5 pg (ref 26.0–34.0)
MCHC: 32.6 g/dL (ref 30.0–36.0)
MCV: 93.4 fL (ref 80.0–100.0)
Platelets: 105 10*3/uL — ABNORMAL LOW (ref 150–400)
RBC: 4.56 MIL/uL (ref 4.22–5.81)
RDW: 15.6 % — ABNORMAL HIGH (ref 11.5–15.5)
WBC: 10.7 10*3/uL — ABNORMAL HIGH (ref 4.0–10.5)
nRBC: 0 % (ref 0.0–0.2)

## 2018-11-11 MED ORDER — BISACODYL 10 MG RE SUPP
10.0000 mg | Freq: Every day | RECTAL | Status: DC | PRN
  Administered 2018-11-11: 10 mg via RECTAL
  Filled 2018-11-11: qty 1

## 2018-11-11 MED ORDER — FLEET ENEMA 7-19 GM/118ML RE ENEM
1.0000 | ENEMA | Freq: Once | RECTAL | Status: AC
  Administered 2018-11-11: 1 via RECTAL
  Filled 2018-11-11: qty 1

## 2018-11-11 MED ORDER — POLYETHYLENE GLYCOL 3350 17 G PO PACK
17.0000 g | PACK | Freq: Once | ORAL | Status: AC
  Administered 2018-11-11: 17 g via ORAL
  Filled 2018-11-11: qty 1

## 2018-11-11 MED ORDER — MAGNESIUM CITRATE PO SOLN: 0.5000 | Freq: Once | ORAL | Status: DC

## 2018-11-11 NOTE — Consult Note (Signed)
Patient seen on walk rounds Case d/w Dr Ovid Curd Patient 13 days.   S: feels some better  O: frail and wasterd  A: Looks comforable  CT 7/16 withe pneumomediastinum  Ct Chest Wo Contrast  Result Date: 11/08/2018 CLINICAL DATA:  Pneumonia EXAM: CT CHEST WITHOUT CONTRAST TECHNIQUE: Multidetector CT imaging of the chest was performed following the standard protocol without IV contrast. COMPARISON:  CT chest, 11/03/2018 FINDINGS: Examination is limited by breath motion artifact throughout, particularly at the lung bases. Cardiovascular: Aortic atherosclerosis. Aortic valve calcifications. Normal heart size. Three-vessel coronary artery calcifications. No pericardial effusion. Mediastinum/Nodes: There is new pneumomediastinum. No enlarged mediastinal, hilar, or axillary lymph nodes. Thyroid gland, trachea, and esophagus demonstrate no significant findings. Lungs/Pleura: Evaluation of the lungs is significantly limited by breath motion artifact, however within this limitation, there is extensive bilateral, predominantly subpleural ground-glass and consolidative pulmonary opacity, significantly increased and more consolidated compared to examination dated 11/03/2018. No pleural effusion or pneumothorax. Upper Abdomen: No acute abnormality. Musculoskeletal: No chest wall mass or suspicious bone lesions identified. IMPRESSION: 1. Evaluation of the lungs is significantly limited by breath motion artifact, however within this limitation, there is extensive bilateral, predominantly subpleural ground-glass and consolidative pulmonary opacity, significantly increased and more consolidated compared to examination dated 11/03/2018. Findings remain consistent with COVID-19 pneumonia. 2. New pneumomediastinum. There is no obvious tracheal or bronchial defect noted. 3.  Coronary artery disease and aortic atherosclerosis. Electronically Signed   By: Eddie Candle M.D.   On: 11/08/2018 10:27   Dg Chest Port 1  View  Result Date: 11/10/2018 CLINICAL DATA:  Follow-up viral pneumonia in pneumomediastinum. EXAM: PORTABLE CHEST 1 VIEW COMPARISON:  11/08/2018 CT.  11/03/2018 radiography. FINDINGS: Mild cardiomegaly. Chronic aortic atherosclerosis. Bilateral patchy pulmonary infiltrates in the mid and lower lungs, worsened since the previous chest radiograph. Pneumomediastinum remains visible. No evidence of pneumothorax. No pleural effusion. No significant bone finding. IMPRESSION: Bilateral mid and lower lung pulmonary infiltrates consistent with viral pneumonia, worsened since the radiograph 1 week ago. Pneumomediastinum. No pneumothorax. Electronically Signed   By: [name] Chimes M.D.   On: 11/10/2018 09:52   P Supportive care with o2 Compassionate use of hydroxychloroquine at his request Avoid intubation - he was DNI but he changed to full code  - will need to be addressed      SIGNATURE    Dr. Brand Males, M.D., F.C.C.P,  Pulmonary and Critical Care Medicine Staff Physician, [place] Director - Interstitial Lung Disease  Program  [facility] [place] [place], [place], [address]  Pager: [phone], If no answer or between  15:00h - 7:00h: call [phone] Telephone: [phone]  5:09 PM 11/11/2018

## 2018-11-11 NOTE — Progress Notes (Signed)
PROGRESS NOTE    Alex [name].  GGE:[id number] DOB: 06-Feb-1941 DOA: 10/28/2018 PCP: [name], MD      Brief Narrative:   [name] is a 78 y.o. M with hx primary amyloidosis s/p autologous stem cell transplant in 2018 at [facility], on maintenance Ninlaro, nephrotic syndrome baseline Cr 1.1-1.2, chronic thrombocytopenia, HTN and CAD who presented with SOB, cough found to have COVID requiring 4L at admission, Transferred to ICU 7/16 due to chest pain, hypoxia and newly diagnosed pneumomediastinum.   Subjective: He reports constipation, last bowel movement before 3 days, becomes very dyspneic, hypoxic upon exertion, no fever, reports intermittent chest pain upon deep breathing, but this has improved .  Assessment & Plan:  Acute hypoxic respiratory failure due to COVID-19 for pneumonia , In setting of ongoing 2020 COVID-19 pandemic.  Admitted 7/5 S/p remdesivir 7/6-7/10 S/p steroids 7/6-7/15 S/p Actemra 7/11 S/p plasma 7/10   -Transferred to ICU 7/16 due to chest pain, hypoxia and newly diagnosed pneumomediastinum.   - Echo 7/16 after development of pneumomediastinum showed normal EF, known mild to moderate AS, no effusion. -His oxygen requirement appears to be improving, this morning he is on 10 L high flow nasal cannula, yesterday he was up at 15 L, his main issue appears ever he is doing minimal activity he becomes profoundly hypoxic, even he did syncopized yesterday, discussed with patient and staff today, will try him on recliner at bedside. -Patient did request Plaquenil treatment, I have discussed with him, expressed concerns about potential side effects of Plaquenil including QTC prolongation/arrhythmia, and liver injury, patient wishes to proceed anyway , I have explained for him uncertainty of its benefits for COVID-19 in general,, but patient would like to try it, he was started on Plaquenil, we will monitor him closely on telemetry for QTC prolongation, and will  monitor his liver function enzymes daily  - ON Plaquenil, check QTC daily -Continue zinc and vitamin C    Pneumomediastinum - Patient developed ectopy, increased work of breathing/dyspnea and chest pain after BM 7/15.  7/16, pain worsened, K and mag normal, ECG normal but CT chest showed new pneumomediastinum.  Transferred to ICU. -Pete chest x-ray 7/18, significant for worsening bilateral infiltrate, presence of pneumomediastinum, but no pneumothorax - Supportive care - Avoid positive intrathoracic pressure - Continue Morphine 2 mg IV PRN for pain control - Continue docusate, avoid valsalva  Acute pulmonary embolism - This was diagnosed 7/11, subsegmental in character.   Treated initially with heparin, transitioned to Eliquis.  First provoked PE.  CTA reviewed with Radiology and degree of hypoxia clearly out of proportion to small PE burden. - Continue Eliquis  History primary amyloidosis on Ninlaro S/p autologous stemm cell transplant 2018 Had been on maintenance Ninlaro (monoclonal Ab proteasome inhibitor, promotes clonal myeloma cell apoptosis, some immunomodulatory/immunesuppressant activity) -Continue with acyclovir prophylaxis -Hold Ninlaro   Coronary disease secondary prevention - Continue aspirin, Lipitor, Zetia  Hypertension - Hold Lasix, Imdur, losartan - May have PRN hydralazine if rebound hypertension  Hyperglycemia - Hyperglycemia resolved.  No prior history diabetes  Other medications - Continue PPI  Chronic thrombocytopenia - Stable  CKD III - Stable       MDM and disposition: The patient is critically ill with multi-organ failure.  Critical care was necessary to treat or prevent imminent or life-threatening deterioration of respiratory failure and was exclusive of separately billable procedures and treating other patients. Total critical care time spent by me: 45 minutes Time spent personally by  me on obtaining history from patient or surrogate,  evaluation of the patient, evaluation of patient's response to treatment, ordering and review of laboratory studies, ordering and review of radiographic studies, ordering and performing treatments and interventions, and re-evaluation of the patient's condition.            DVT prophylaxis: Eliquis Code Status: FULL Family Communication: Wife by phone    Consultants:   CCM  Procedures:   CTA 7/11  CT chest w/o contrast 7/16  Echo 7/16 IMPRESSIONS    1. The left ventricle has normal systolic function, with an ejection fraction of 60-65%. The cavity size was normal. There is mildly increased left ventricular wall thickness. Left ventricular diastolic function could not be evaluated.  2. Left atrial size was not well visualized.  3. The aortic valve was not well visualized.  4. Limited echo no doppler poorly visualized known mild to moderate AS with mean gradient 19 mmHg September 2019.  5. The interatrial septum was not well visualized.     Antimicrobials:   None   Culture data:   7/5 blood culture x2 NGTD   Objective: Vitals:   11/11/18 0540 11/11/18 0600 11/11/18 0700 11/11/18 0718  BP:  112/80 (!) 94/59 (!) 94/59  Pulse: 64 65 64 67  Resp: 18 16 16 15   Temp:      TempSrc:      SpO2: 93% 91% 93% 92%  Weight:      Height:        Intake/Output Summary (Last 24 hours) at 11/11/2018 1029 Last data filed at 11/11/2018 0526 Gross per 24 hour  Intake 240 ml  Output 1125 ml  Net -885 ml   Filed Weights   10/29/18 0422  Weight: 92.2 kg    Examination:  Awake Alert, Oriented X 3, No new F.N deficits, Normal affect, frail, appears tired Symmetrical Chest wall movement, diminished air entry bilaterally, no rales or rhonchi RRR,No Gallops,Rubs or new Murmurs, No Parasternal Heave +ve B.Sounds, Abd Soft, No tenderness, No rebound - guarding or rigidity. No Cyanosis, Clubbing or edema, No new Rash or bruise        Data Reviewed: I have personally  reviewed following labs and imaging studies:  CBC: Recent Labs  Lab 11/05/18 0150 11/06/18 0215 11/07/18 0250 11/08/18 0500 11/08/18 0908 11/10/18 1105 11/11/18 0039  WBC 12.1* 14.7* 12.7* 12.9*  --  14.1* 10.7*  NEUTROABS 10.8*  --   --   --   --   --   --   HGB 11.9* 11.9* 13.1 13.4 13.6 14.8 13.9  HCT 36.5* 36.1* 38.6* 40.8 40.0 44.0 42.6  MCV 92.6 93.0 92.6 92.3  --  93.0 93.4  PLT 116* 129* 119* 115*  --  130* 401*   Basic Metabolic Panel: Recent Labs  Lab 11/06/18 0215 11/07/18 0250 11/08/18 0500 11/08/18 0908 11/10/18 1105 11/11/18 0039  NA 138 139 138 139 137 139  K 4.5 4.3 4.4 4.0 4.1 4.1  CL 105 103 104  --  102 106  CO2 24 25 22   --  24 24  GLUCOSE 238* 118* 115*  --  134* 117*  BUN 49* 57* 49*  --  33* 32*  CREATININE 1.08 1.27* 1.14  --  1.11 1.05  CALCIUM 7.9* 8.1* 8.1*  --  8.0* 8.0*  MG  --  2.5*  --   --   --   --    GFR: Estimated Creatinine Clearance: 69.4 mL/min (by C-G formula based  on SCr of 1.05 mg/dL). Liver Function Tests: Recent Labs  Lab 11/05/18 0150 11/06/18 0215 11/07/18 0250 11/10/18 1105 11/11/18 0039  AST 61* 52* 42* 35 29  ALT 100* 92* 80* 55* 46*  ALKPHOS 78 81 80 81 73  BILITOT 0.8 0.9 1.2 1.0 1.2  PROT 5.6* 5.3* 5.6* 5.8* 5.5*  ALBUMIN 2.6* 2.5* 2.6* 2.8* 2.7*   No results for input(s): LIPASE, AMYLASE in the last 168 hours. No results for input(s): AMMONIA in the last 168 hours. Coagulation Profile: No results for input(s): INR, PROTIME in the last 168 hours. Cardiac Enzymes: No results for input(s): CKTOTAL, CKMB, CKMBINDEX, TROPONINI in the last 168 hours. BNP (last 3 results) No results for input(s): PROBNP in the last 8760 hours. HbA1C: No results for input(s): HGBA1C in the last 72 hours. CBG: Recent Labs  Lab 11/08/18 0909 11/08/18 1117 11/08/18 1634 11/08/18 2154 11/09/18 0741  GLUCAP 112* 153* 115* 143* 113*   Lipid Profile: No results for input(s): CHOL, HDL, LDLCALC, TRIG, CHOLHDL, LDLDIRECT in  the last 72 hours. Thyroid Function Tests: No results for input(s): TSH, T4TOTAL, FREET4, T3FREE, THYROIDAB in the last 72 hours. Anemia Panel: No results for input(s): VITAMINB12, FOLATE, FERRITIN, TIBC, IRON, RETICCTPCT in the last 72 hours. Urine analysis:    Component Value Date/Time   COLORURINE YELLOW 04/24/2018 [place] 04/24/2018 1042   LABSPEC 1.008 04/24/2018 1042   PHURINE 5.0 04/24/2018 1042   GLUCOSEU NEGATIVE 04/24/2018 1042   HGBUR MODERATE (A) 04/24/2018 1042   BILIRUBINUR NEGATIVE 04/24/2018 1042   KETONESUR NEGATIVE 04/24/2018 1042   PROTEINUR 30 (A) 04/24/2018 1042   NITRITE NEGATIVE 04/24/2018 1042   LEUKOCYTESUR NEGATIVE 04/24/2018 1042   Sepsis Labs: @LABRCNTIP (procalcitonin:4,lacticacidven:4)  ) Recent Results (from the past 240 hour(s))  MRSA PCR Screening     Status: None   Collection Time: 11/08/18 10:31 AM   Specimen: Nasal Mucosa; Nasopharyngeal  Result Value Ref Range Status   MRSA by PCR NEGATIVE NEGATIVE Final    Comment:        The GeneXpert MRSA Assay (FDA approved for NASAL specimens only), is one component of a comprehensive MRSA colonization surveillance program. It is not intended to diagnose MRSA infection nor to guide or monitor treatment for MRSA infections. Performed at [facility] [place] [address] [place], [place] [address]          Radiology Studies: Dg Chest Port 1 View  Result Date: 11/10/2018 CLINICAL DATA:  Follow-up viral pneumonia in pneumomediastinum. EXAM: PORTABLE CHEST 1 VIEW COMPARISON:  11/08/2018 CT.  11/03/2018 radiography. FINDINGS: Mild cardiomegaly. Chronic aortic atherosclerosis. Bilateral patchy pulmonary infiltrates in the mid and lower lungs, worsened since the previous chest radiograph. Pneumomediastinum remains visible. No evidence of pneumothorax. No pleural effusion. No significant bone finding. IMPRESSION: Bilateral mid and lower lung pulmonary infiltrates  consistent with viral pneumonia, worsened since the radiograph 1 week ago. Pneumomediastinum. No pneumothorax. Electronically Signed   By: [name] Chimes M.D.   On: 11/10/2018 09:52        Scheduled Meds: . acyclovir  400 mg Oral BID  . apixaban  5 mg Oral BID  . aspirin EC  81 mg Oral Daily  . atorvastatin  40 mg Oral QHS  . Chlorhexidine Gluconate Cloth  6 each Topical Daily  . cholecalciferol  1,000 Units Oral BID  . docusate sodium  100 mg Oral Daily  . ezetimibe  10 mg Oral Daily  . feeding supplement (ENSURE ENLIVE)  237 mL Oral BID BM  . folic acid  1 mg Oral QHS  . guaiFENesin  1,200 mg Oral BID  . hydroxychloroquine  200 mg Oral BID  . magnesium citrate  0.5 Bottle Oral Once  . multivitamin with minerals  1 tablet Oral Daily  . pantoprazole  80 mg Oral Daily  . Ensure Max Protein  11 oz Oral Daily  . psyllium  1 packet Oral Daily  . vitamin C  1,000 mg Oral BID   Continuous Infusions:   LOS: 13 days      Phillips Climes, MD [facility] 11/11/2018, 10:29 AM     Please page through [place]:  [email or web address] Password [email or web address] If 7PM-7AM, please contact night-coverage

## 2018-11-11 NOTE — Progress Notes (Signed)
Assisted patient with Facetime video visit with wife.

## 2018-11-11 NOTE — Progress Notes (Signed)
Spoke with patient's wife and provided full update/answered all questions.  

## 2018-11-11 NOTE — Progress Notes (Signed)
Pt on 8 L HFNC. No issues with oxygenation overnight. This RN assisted patient to talk with several family members over the phone. No complaints of pain overnight. Plan for OOBTC today.

## 2018-11-12 ENCOUNTER — Inpatient Hospital Stay (HOSPITAL_COMMUNITY): Payer: Non-veteran care

## 2018-11-12 ENCOUNTER — Other Ambulatory Visit: Payer: Self-pay

## 2018-11-12 LAB — CBC
HCT: 41.1 % (ref 39.0–52.0)
Hemoglobin: 13.3 g/dL (ref 13.0–17.0)
MCH: 30.5 pg (ref 26.0–34.0)
MCHC: 32.4 g/dL (ref 30.0–36.0)
MCV: 94.3 fL (ref 80.0–100.0)
Platelets: 109 10*3/uL — ABNORMAL LOW (ref 150–400)
RBC: 4.36 MIL/uL (ref 4.22–5.81)
RDW: 15.8 % — ABNORMAL HIGH (ref 11.5–15.5)
WBC: 8.9 10*3/uL (ref 4.0–10.5)
nRBC: 0 % (ref 0.0–0.2)

## 2018-11-12 LAB — COMPREHENSIVE METABOLIC PANEL
ALT: 43 U/L (ref 0–44)
AST: 31 U/L (ref 15–41)
Albumin: 2.7 g/dL — ABNORMAL LOW (ref 3.5–5.0)
Alkaline Phosphatase: 68 U/L (ref 38–126)
Anion gap: 6 (ref 5–15)
BUN: 37 mg/dL — ABNORMAL HIGH (ref 8–23)
CO2: 27 mmol/L (ref 22–32)
Calcium: 8.2 mg/dL — ABNORMAL LOW (ref 8.9–10.3)
Chloride: 108 mmol/L (ref 98–111)
Creatinine, Ser: 1.27 mg/dL — ABNORMAL HIGH (ref 0.61–1.24)
GFR calc Af Amer: >60 mL/min (ref >60)
GFR calc non Af Amer: 55 mL/min — ABNORMAL LOW (ref >60)
Glucose, Bld: 108 mg/dL — ABNORMAL HIGH (ref 70–99)
Potassium: 4.1 mmol/L (ref 3.5–5.1)
Sodium: 141 mmol/L (ref 135–145)
Total Bilirubin: 1 mg/dL (ref 0.3–1.2)
Total Protein: 5.2 g/dL — ABNORMAL LOW (ref 6.5–8.1)

## 2018-11-12 LAB — MAGNESIUM: Magnesium: 2.2 mg/dL (ref 1.7–2.4)

## 2018-11-12 LAB — C-REACTIVE PROTEIN: CRP: <0.8 mg/dL (ref <1.0)

## 2018-11-12 LAB — FERRITIN: Ferritin: 76 ng/mL (ref 24–336)

## 2018-11-12 LAB — LACTATE DEHYDROGENASE: LDH: 283 U/L — ABNORMAL HIGH (ref 98–192)

## 2018-11-12 MED ORDER — POLYETHYLENE GLYCOL 3350 17 G PO PACK
17.0000 g | PACK | Freq: Every day | ORAL | Status: DC
  Administered 2018-11-12 – 2018-11-17 (×6): 17 g via ORAL
  Filled 2018-11-12 (×6): qty 1

## 2018-11-12 NOTE — Progress Notes (Signed)
Pt's son called for updates, gave callback number [phone]. Will ask pt if okay to call and update him.

## 2018-11-12 NOTE — Progress Notes (Signed)
Pt's son called back w/pt's permission, updated. Grateful for updates.

## 2018-11-12 NOTE — Progress Notes (Signed)
PROGRESS NOTE    Alex [name].  TMA:[id number] DOB: December 08, 1940 DOA: 10/28/2018 PCP: [name], MD      Brief Narrative:   Alex [name] is a 78 y.o. M with hx primary amyloidosis s/p autologous stem cell transplant in 2018 at [facility], on maintenance Ninlaro, nephrotic syndrome baseline Cr 1.1-1.2, chronic thrombocytopenia, HTN and CAD who presented with SOB, cough found to have COVID requiring 4L at admission, Transferred to ICU 7/16 due to chest pain, hypoxia and newly diagnosed pneumomediastinum.   Subjective: Patient report no further constipation, was able to sit in recliner yesterday, this some dyspnea on exertion, encouraged to drink his Ensure.  Assessment & Plan:  Acute hypoxic respiratory failure due to COVID-19 for pneumonia , In setting of ongoing 2020 COVID-19 pandemic.  Admitted 7/5 S/p remdesivir 7/6-7/10 S/p steroids 7/6-7/15 S/p Actemra 7/11 S/p plasma 7/10   -Transferred to ICU 7/16 due to chest pain, hypoxia and newly diagnosed pneumomediastinum.   - Echo 7/16 after development of pneumomediastinum showed normal EF, known mild to moderate AS, no effusion. -Patient remains with significant oxygen requirement, this morning he is on 9 L high flow nasal cannula, he was able to tolerate sitting on the recliner yesterday with some hypoxia where he required increased oxygen requirement, discussed with staff, will ambulate today to chair. -Patient did request Plaquenil treatment, I have discussed with him, expressed concerns about potential side effects of Plaquenil including QTC prolongation/arrhythmia, and liver injury, patient wishes to proceed anyway , I have explained for him uncertainty of its benefits for COVID-19 in general,, but patient would like to try it, he was started on Plaquenil, we will monitor him closely on telemetry for QTC prolongation, and will monitor his liver function, so far QTc within normal limit, no significant changes in LFTs. - ON  Plaquenil, check QTC daily -Continue zinc and vitamin C    Pneumomediastinum - Patient developed ectopy, increased work of breathing/dyspnea and chest pain after BM 7/15.  7/16, pain worsened, K and mag normal, ECG normal but CT chest showed new pneumomediastinum.  Transferred to ICU. -Pete chest x-ray 7/18, significant for worsening bilateral infiltrate, presence of pneumomediastinum, but no pneumothorax - Supportive care - Avoid positive intrathoracic pressure - Continue Morphine 2 mg IV PRN for pain control - Continue docusate, avoid valsalva  Acute pulmonary embolism - This was diagnosed 7/11, subsegmental in character.   Treated initially with heparin, transitioned to Eliquis.  First provoked PE.  CTA reviewed with Radiology and degree of hypoxia clearly out of proportion to small PE burden. - Continue Eliquis  History primary amyloidosis on Ninlaro S/p autologous stemm cell transplant 2018 Had been on maintenance Ninlaro (monoclonal Ab proteasome inhibitor, promotes clonal myeloma cell apoptosis, some immunomodulatory/immunesuppressant activity) -Continue with acyclovir prophylaxis -Hold Ninlaro   Coronary disease secondary prevention - Continue aspirin, Lipitor, Zetia  Hypertension - Hold Lasix, Imdur, losartan - May have PRN hydralazine if rebound hypertension  Hyperglycemia - Hyperglycemia resolved.  No prior history diabetes  Other medications - Continue PPI  Chronic thrombocytopenia - Stable  CKD III - Stable       MDM and disposition: The patient is critically ill with multi-organ failure.  Critical care was necessary to treat or prevent imminent or life-threatening deterioration of respiratory failure and was exclusive of separately billable procedures and treating other patients. Total critical care time spent by me: 45 minutes Time spent personally by me on obtaining history from patient or surrogate, evaluation of the patient,  evaluation of patient's  response to treatment, ordering and review of laboratory studies, ordering and review of radiographic studies, ordering and performing treatments and interventions, and re-evaluation of the patient's condition.            DVT prophylaxis: Eliquis Code Status: FULL Family Communication: Left message to wife by phone, will try to call again today    Consultants:   CCM  Procedures:   CTA 7/11  CT chest w/o contrast 7/16  Echo 7/16 IMPRESSIONS    1. The left ventricle has normal systolic function, with an ejection fraction of 60-65%. The cavity size was normal. There is mildly increased left ventricular wall thickness. Left ventricular diastolic function could not be evaluated.  2. Left atrial size was not well visualized.  3. The aortic valve was not well visualized.  4. Limited echo no doppler poorly visualized known mild to moderate AS with mean gradient 19 mmHg September 2019.  5. The interatrial septum was not well visualized.     Antimicrobials:   None   Culture data:   7/5 blood culture x2 NGTD   Objective: Vitals:   11/12/18 0500 11/12/18 0700 11/12/18 0735 11/12/18 0800  BP: 104/68 133/77 133/77 109/76  Pulse: 62 61 (!) 59 60  Resp: 19 20 18 12   Temp: 97.8 F (36.6 C)   98.1 F (36.7 C)  TempSrc: Oral   Oral  SpO2: 93% 94% 94% 95%  Weight:      Height:        Intake/Output Summary (Last 24 hours) at 11/12/2018 1015 Last data filed at 11/11/2018 1900 Gross per 24 hour  Intake 470 ml  Output -  Net 470 ml   Filed Weights   10/29/18 0422  Weight: 92.2 kg    Examination:  Awake Alert, Oriented X 3, No new F.N deficits, frail t Symmetrical Chest wall movement, diminished air entry bilaterally, no rales or rhonchi RRR,No Gallops,Rubs or new Murmurs, No Parasternal Heave +ve B.Sounds, Abd Soft, No tenderness, No rebound - guarding or rigidity. No Cyanosis, Clubbing or edema, No new Rash or bruise         Data Reviewed: I have  personally reviewed following labs and imaging studies:  CBC: Recent Labs  Lab 11/07/18 0250 11/08/18 0500 11/08/18 0908 11/10/18 1105 11/11/18 0039 11/12/18 0528  WBC 12.7* 12.9*  --  14.1* 10.7* 8.9  HGB 13.1 13.4 13.6 14.8 13.9 13.3  HCT 38.6* 40.8 40.0 44.0 42.6 41.1  MCV 92.6 92.3  --  93.0 93.4 94.3  PLT 119* 115*  --  130* 105* 814*   Basic Metabolic Panel: Recent Labs  Lab 11/07/18 0250 11/08/18 0500 11/08/18 0908 11/10/18 1105 11/11/18 0039 11/12/18 0528  NA 139 138 139 137 139 141  K 4.3 4.4 4.0 4.1 4.1 4.1  CL 103 104  --  102 106 108  CO2 25 22  --  24 24 27   GLUCOSE 118* 115*  --  134* 117* 108*  BUN 57* 49*  --  33* 32* 37*  CREATININE 1.27* 1.14  --  1.11 1.05 1.27*  CALCIUM 8.1* 8.1*  --  8.0* 8.0* 8.2*  MG 2.5*  --   --   --   --  2.2   GFR: Estimated Creatinine Clearance: 57.4 mL/min (A) (by C-G formula based on SCr of 1.27 mg/dL (H)). Liver Function Tests: Recent Labs  Lab 11/06/18 0215 11/07/18 0250 11/10/18 1105 11/11/18 0039 11/12/18 0528  AST 52* 42* 35 29 31  ALT 92* 80* 55* 46* 43  ALKPHOS 81 80 81 73 68  BILITOT 0.9 1.2 1.0 1.2 1.0  PROT 5.3* 5.6* 5.8* 5.5* 5.2*  ALBUMIN 2.5* 2.6* 2.8* 2.7* 2.7*   No results for input(s): LIPASE, AMYLASE in the last 168 hours. No results for input(s): AMMONIA in the last 168 hours. Coagulation Profile: No results for input(s): INR, PROTIME in the last 168 hours. Cardiac Enzymes: No results for input(s): CKTOTAL, CKMB, CKMBINDEX, TROPONINI in the last 168 hours. BNP (last 3 results) No results for input(s): PROBNP in the last 8760 hours. HbA1C: No results for input(s): HGBA1C in the last 72 hours. CBG: Recent Labs  Lab 11/08/18 0909 11/08/18 1117 11/08/18 1634 11/08/18 2154 11/09/18 0741  GLUCAP 112* 153* 115* 143* 113*   Lipid Profile: No results for input(s): CHOL, HDL, LDLCALC, TRIG, CHOLHDL, LDLDIRECT in the last 72 hours. Thyroid Function Tests: No results for input(s): TSH,  T4TOTAL, FREET4, T3FREE, THYROIDAB in the last 72 hours. Anemia Panel: Recent Labs    11/12/18 0528  FERRITIN 76   Urine analysis:    Component Value Date/Time   COLORURINE YELLOW 04/24/2018 [place] 04/24/2018 1042   LABSPEC 1.008 04/24/2018 1042   PHURINE 5.0 04/24/2018 1042   GLUCOSEU NEGATIVE 04/24/2018 1042   HGBUR MODERATE (A) 04/24/2018 1042   BILIRUBINUR NEGATIVE 04/24/2018 1042   KETONESUR NEGATIVE 04/24/2018 1042   PROTEINUR 30 (A) 04/24/2018 1042   NITRITE NEGATIVE 04/24/2018 1042   LEUKOCYTESUR NEGATIVE 04/24/2018 1042   Sepsis Labs: @LABRCNTIP (procalcitonin:4,lacticacidven:4)  ) Recent Results (from the past 240 hour(s))  MRSA PCR Screening     Status: None   Collection Time: 11/08/18 10:31 AM   Specimen: Nasal Mucosa; Nasopharyngeal  Result Value Ref Range Status   MRSA by PCR NEGATIVE NEGATIVE Final    Comment:        The GeneXpert MRSA Assay (FDA approved for NASAL specimens only), is one component of a comprehensive MRSA colonization surveillance program. It is not intended to diagnose MRSA infection nor to guide or monitor treatment for MRSA infections. Performed at [facility], [place] [address], [place], [place] [address]          Radiology Studies: Dg Chest Port 1 View  Result Date: 11/12/2018 CLINICAL DATA:  Respiratory failure. EXAM: PORTABLE CHEST 1 VIEW COMPARISON:  11/10/2018. FINDINGS: Stable cardiomegaly. Bilateral pulmonary infiltrates particularly prominent in the peripheries of both lungs. No interim change. No pleural effusion or pneumothorax. Scratch degenerative changes and scoliosis thoracic spine. IMPRESSION: Persistent bilateral pulmonary infiltrates, no change from prior exam. Electronically Signed   By: [place]   On: 11/12/2018 06:18        Scheduled Meds: . acyclovir  400 mg Oral BID  . apixaban  5 mg Oral BID  . aspirin EC  81 mg Oral Daily  . atorvastatin  40 mg Oral  QHS  . Chlorhexidine Gluconate Cloth  6 each Topical Daily  . cholecalciferol  1,000 Units Oral BID  . docusate sodium  100 mg Oral Daily  . ezetimibe  10 mg Oral Daily  . feeding supplement (ENSURE ENLIVE)  237 mL Oral BID BM  . folic acid  1 mg Oral QHS  . guaiFENesin  1,200 mg Oral BID  . hydroxychloroquine  200 mg Oral BID  . magnesium citrate  0.5 Bottle Oral Once  . multivitamin with minerals  1 tablet Oral Daily  . pantoprazole  80 mg Oral Daily  . polyethylene  glycol  17 g Oral Daily  . Ensure Max Protein  11 oz Oral Daily  . psyllium  1 packet Oral Daily  . vitamin C  1,000 mg Oral BID   Continuous Infusions:   LOS: 14 days      Phillips Climes, MD [facility] 11/12/2018, 10:15 AM     Please page through [place]:  [email or web address] Password [email or web address] If 7PM-7AM, please contact night-coverage

## 2018-11-12 NOTE — Plan of Care (Signed)

## 2018-11-12 NOTE — Progress Notes (Signed)
Assisted video Facetime call between patient and wife.

## 2018-11-12 NOTE — Plan of Care (Signed)
  Problem: Respiratory: Goal: Will maintain a patent airway Outcome: Progressing Goal: Complications related to the disease process, condition or treatment will be avoided or minimized Outcome: Progressing   Problem: Respiratory: Goal: Complications related to the disease process, condition or treatment will be avoided or minimized Outcome: Progressing   Problem: Education: Goal: Knowledge of risk factors and measures for prevention of condition will improve Outcome: Progressing   

## 2018-11-12 NOTE — Progress Notes (Addendum)
Occupational Therapy Evaluation Patient Details Name: Alex [name]. MRN: [id number] DOB: March 18, 1941 Today's Date: 11/12/2018    History of Present Illness 78 year old male with history of amyloidosis, nephrotic syndrome, on immunosuppressive therapy and steroids, hypertension, coronary artery disease, hearing loss, cervical fusion, rt foot surgery x2, bil RTC repair, lt knee surgery, tigger finger releases was admitted to the hospital on 10/28/2018 with shortness of breath, was diagnosed with COVID-19 pneumonia and was admitted to the hospital. 7/11 +PE   Clinical Impression   PTA, pt lived at home with his "bride" and was independent with ADL and mobility. Pt enjoyed spending time with his wife of "25 years". Pt very motivated to participate with OT. Discussed with Dr Waldron Labs who cleared pt to mobilize and participate in exercise as tolerated. Pt with desat to 82 on 8L 2/4 DOEwith mobility to chair after 3 repetitions of sit - stand. Rebounded to 94 in less than 1 min. Pt able to converse without significant SOB, which he stated was an improvement.  Able to take several steps. Pt issued theraband to complete independently. Pt very appreciative. REcommend follow up with [name]. May need [name] pending progress. Will follow acutely.     Follow Up Recommendations  Home health OT;Supervision/Assistance - 24 hour    Equipment Recommendations  3 in 1 bedside commode    Recommendations for Other Services       Precautions / Restrictions Precautions Precautions: Fall Precaution Comments: watch O2 sats; Don't have pt "bear down"/vagal down      Mobility Bed Mobility Overal bed mobility: Needs Assistance Bed Mobility: Supine to Sit     Supine to sit: Min guard        Transfers Overall transfer level: Needs assistance Equipment used: Rolling walker (2 wheeled) Transfers: Sit to/from [facility] Sit to Stand: Min guard Stand pivot transfers: Min guard        General transfer comment: pt stated he liked using the walker for support    Balance Overall balance assessment: Needs assistance Sitting-balance support: Feet supported Sitting balance-Leahy Scale: Good       Standing balance-Leahy Scale: Fair                             ADL either performed or assessed with clinical judgement   ADL Overall ADL's : Needs assistance/impaired Eating/Feeding: Modified independent   Grooming: Set up;Sitting   Upper Body Bathing: Set up;Sitting           Lower Body Dressing: Moderate assistance;Sit to/from stand               Functional mobility during ADLs: Min guard;Rolling walker;Cueing for safety       Vision   Additional Comments: wears readers     Perception     Praxis      Pertinent Vitals/Pain Pain Assessment: Faces Faces Pain Scale: Hurts little more Pain Location: back Pain Descriptors / Indicators: Discomfort Pain Intervention(s): Limited activity within patient's tolerance     Hand Dominance Right   Extremity/Trunk Assessment Upper Extremity Assessment Upper Extremity Assessment: Generalized weakness   Lower Extremity Assessment Lower Extremity Assessment: Defer to PT evaluation       Communication Communication Communication: HOH   Cognition Arousal/Alertness: Awake/alert Behavior During Therapy: WFL for tasks assessed/performed Overall Cognitive Status: Within Functional Limits for tasks assessed  General Comments  Pt wants to go home "Friday"; misses his wife of 25 years "[name]"; very motivated    Exercises Exercises: General Upper Extremity;Other exercises General Exercises - Upper Extremity Shoulder Flexion: Strengthening;Both;5 reps;Seated;Theraband Theraband Level (Shoulder Flexion): Level 1 (Yellow) Shoulder Extension: Strengthening;Both;5 reps;Seated;Theraband Theraband Level (Shoulder Extension): Level 1 (Yellow) Elbow  Flexion: Strengthening;Both;10 reps;Seated;Theraband Theraband Level (Elbow Flexion): Level 1 (Yellow) Elbow Extension: Strengthening;Both;10 reps;Seated;Theraband Theraband Level (Elbow Extension): Level 1 (Yellow) Other Exercises Other Exercises: chair marching x 10 2 sets Other Exercises: sit - stand x 3 from bed   Shoulder Instructions      Home Living Family/patient expects to be discharged to:: Private residence Living Arrangements: Spouse/significant other Available Help at Discharge: Family Type of Home: House Home Access: Stairs to enter Technical brewer of Steps: 1 Entrance Stairs-Rails: None Home Layout: One level     Bathroom Shower/Tub: Occupational psychologist: Handicapped height Bathroom Accessibility: Yes How Accessible: Accessible via walker Home Equipment: [name] - 2 wheels          Prior Functioning/Environment Level of Independence: Independent        Comments: loves spending time with his wife; emjoyed fishing        OT Problem List: Decreased strength;Decreased activity tolerance;Decreased safety awareness;Decreased knowledge of use of DME or AE;Cardiopulmonary status limiting activity;Pain;Impaired balance (sitting and/or standing)      OT Treatment/Interventions: Self-care/ADL training;Therapeutic exercise;Energy conservation;DME and/or AE instruction;Therapeutic activities;Patient/family education;Balance training    OT Goals(Current goals can be found in the care plan section) Acute Rehab OT Goals Patient Stated Goal: go home to his bride OT Goal Formulation: With patient Time For Goal Achievement: 11/26/18 Potential to Achieve Goals: Good  OT Frequency: Min 3X/week   Barriers to D/C:            Co-evaluation              AM-PAC OT "6 Clicks" Daily Activity     Outcome Measure Help from another person eating meals?: None Help from another person taking care of personal grooming?: A Little Help from another  person toileting, which includes using toliet, bedpan, or urinal?: A Little Help from another person bathing (including washing, rinsing, drying)?: A Lot Help from another person to put on and taking off regular upper body clothing?: A Little Help from another person to put on and taking off regular lower body clothing?: A Lot 6 Click Score: 17   End of Session Equipment Utilized During Treatment: Rolling walker;Oxygen(8L) Nurse Communication: Mobility status  Activity Tolerance: Patient tolerated treatment well Patient left: in chair;with call bell/phone within reach;with nursing/sitter in room  OT Visit Diagnosis: Unsteadiness on feet (R26.81);Muscle weakness (generalized) (M62.81);Pain Pain - part of body: (back)                Time: 1430-1505 OT Time Calculation (min): 35 min Charges:  OT General Charges $OT Visit: 1 Visit OT Evaluation $OT Eval Moderate Complexity: 1 Mod OT Treatments $Self Care/Home Management : 8-22 mins  Maurie Boettcher, OT/L   Acute OT Clinical Specialist Acute Rehabilitation Services Pager [phone] Office [phone]   [facility] 11/12/2018, 4:20 PM

## 2018-11-13 ENCOUNTER — Other Ambulatory Visit: Payer: Self-pay

## 2018-11-13 LAB — COMPREHENSIVE METABOLIC PANEL
ALT: 45 U/L — ABNORMAL HIGH (ref 0–44)
AST: 33 U/L (ref 15–41)
Albumin: 3 g/dL — ABNORMAL LOW (ref 3.5–5.0)
Alkaline Phosphatase: 66 U/L (ref 38–126)
Anion gap: 9 (ref 5–15)
BUN: 34 mg/dL — ABNORMAL HIGH (ref 8–23)
CO2: 26 mmol/L (ref 22–32)
Calcium: 8.1 mg/dL — ABNORMAL LOW (ref 8.9–10.3)
Chloride: 106 mmol/L (ref 98–111)
Creatinine, Ser: 1.22 mg/dL (ref 0.61–1.24)
GFR calc Af Amer: >60 mL/min (ref >60)
GFR calc non Af Amer: 58 mL/min — ABNORMAL LOW (ref >60)
Glucose, Bld: 94 mg/dL (ref 70–99)
Potassium: 4.3 mmol/L (ref 3.5–5.1)
Sodium: 141 mmol/L (ref 135–145)
Total Bilirubin: 1 mg/dL (ref 0.3–1.2)
Total Protein: 5.6 g/dL — ABNORMAL LOW (ref 6.5–8.1)

## 2018-11-13 LAB — CBC
HCT: 42.7 % (ref 39.0–52.0)
Hemoglobin: 13.7 g/dL (ref 13.0–17.0)
MCH: 30.8 pg (ref 26.0–34.0)
MCHC: 32.1 g/dL (ref 30.0–36.0)
MCV: 96 fL (ref 80.0–100.0)
Platelets: 101 10*3/uL — ABNORMAL LOW (ref 150–400)
RBC: 4.45 MIL/uL (ref 4.22–5.81)
RDW: 15.9 % — ABNORMAL HIGH (ref 11.5–15.5)
WBC: 7.3 10*3/uL (ref 4.0–10.5)
nRBC: 0 % (ref 0.0–0.2)

## 2018-11-13 LAB — MAGNESIUM: Magnesium: 2.3 mg/dL (ref 1.7–2.4)

## 2018-11-13 LAB — FERRITIN: Ferritin: 76 ng/mL (ref 24–336)

## 2018-11-13 LAB — C-REACTIVE PROTEIN: CRP: <0.8 mg/dL (ref <1.0)

## 2018-11-13 LAB — LACTATE DEHYDROGENASE: LDH: 284 U/L — ABNORMAL HIGH (ref 98–192)

## 2018-11-13 NOTE — Progress Notes (Addendum)
PROGRESS NOTE    Alex [name].  EHM:[id number] DOB: 02/17/1941 DOA: 10/28/2018 PCP: [name], MD      Brief Narrative:   [name] is a 78 y.o. M with hx primary amyloidosis s/p autologous stem cell transplant in 2018 at [facility], on maintenance Ninlaro, nephrotic syndrome baseline Cr 1.1-1.2, chronic thrombocytopenia, HTN and CAD who presented with SOB, cough found to have COVID requiring 4L at admission, Transferred to ICU 7/16 due to chest pain, hypoxia and newly diagnosed pneumomediastinum.   Subjective: Patient report no further constipation, reported dyspnea has improved, chest pain on deep inspiration almost resolved, able to sit on the recliner yesterday, no further constipation .  Assessment & Plan:  Acute hypoxic respiratory failure due to COVID-19 for pneumonia , In setting of ongoing 2020 COVID-19 pandemic.  Admitted 7/5 S/p remdesivir 7/6-7/10 S/p steroids 7/6-7/15 S/p Actemra 7/11 S/p plasma 7/10   -Transferred to ICU 7/16 due to chest pain, hypoxia and newly diagnosed pneumomediastinum.   - Echo 7/16 after development of pneumomediastinum showed normal EF, known mild to moderate AS, no effusion. -Patient remains averaging between 8 to 10 L high flow nasal cannula, significantly hypoxic upon minimal exertion, but this appears to be improving, less hypoxic on exertion, as well it takes him less time to recover . -Patient did request Plaquenil treatment, I have discussed with him, expressed concerns about potential side effects of Plaquenil including QTC prolongation/arrhythmia, and liver injury, patient wishes to proceed anyway , I have explained for him uncertainty of its benefits for COVID-19 , but patient would like to try it, he was started on Plaquenil, we will monitor him closely on telemetry for QTC prolongation, and will monitor his liver function, so far QTc within normal limit, no significant changes in LFTs. - ON Plaquenil, check QTC daily  -Continue zinc and vitamin C    Pneumomediastinum - Patient developed ectopy, increased work of breathing/dyspnea and chest pain after BM 7/15.  7/16, pain worsened, K and mag normal, ECG normal but CT chest showed new pneumomediastinum.  Transferred to ICU. -Pete chest x-ray 7/18, significant for worsening bilateral infiltrate, presence of pneumomediastinum, but no pneumothorax - Supportive care - Avoid positive intrathoracic pressure - Continue Morphine 2 mg IV PRN for pain control - Continue docusate, avoid valsalva  Acute pulmonary embolism - This was diagnosed 7/11, subsegmental in character.   Treated initially with heparin, transitioned to Eliquis.  First provoked PE.  CTA reviewed with Radiology and degree of hypoxia clearly out of proportion to small PE burden. - Continue Eliquis  History primary amyloidosis on Ninlaro S/p autologous stemm cell transplant 2018 Had been on maintenance Ninlaro (monoclonal Ab proteasome inhibitor, promotes clonal myeloma cell apoptosis, some immunomodulatory/immunesuppressant activity) -Continue with acyclovir prophylaxis -Hold Ninlaro  Coronary disease secondary prevention - Continue aspirin, Lipitor, Zetia  Hypertension - Hold Lasix, Imdur, losartan - May have PRN hydralazine if rebound hypertension  Hyperglycemia - Hyperglycemia resolved.  No prior history diabetes  Other medications - Continue PPI  Chronic thrombocytopenia - Stable  CKD III - Stable  Transaminitis -Mild, secondary to COVID, stable,    DVT prophylaxis: Eliquis Code Status: FULL Family Communication: Updating his wife daily Disposition: Monitor patient today in ICU, will transfer to PCU end of the day if he remains stable    Consultants:   CCM  Procedures:   CTA 7/11  CT chest w/o contrast 7/16  Echo 7/16 IMPRESSIONS    1. The left ventricle has normal systolic  function, with an ejection fraction of 60-65%. The cavity size was normal. There  is mildly increased left ventricular wall thickness. Left ventricular diastolic function could not be evaluated.  2. Left atrial size was not well visualized.  3. The aortic valve was not well visualized.  4. Limited echo no doppler poorly visualized known mild to moderate AS with mean gradient 19 mmHg September 2019.  5. The interatrial septum was not well visualized.     Antimicrobials:   None   Culture data:   7/5 blood culture x2 NGTD   Objective: Vitals:   11/13/18 0700 11/13/18 0800 11/13/18 0900 11/13/18 1000  BP: 119/81 108/75 105/69 107/75  Pulse: 64 60 65 71  Resp: 18 17 16  (!) 23  Temp:      TempSrc:      SpO2: 96% 98% 92% (!) 81%  Weight:      Height:        Intake/Output Summary (Last 24 hours) at 11/13/2018 1028 Last data filed at 11/12/2018 2200 Gross per 24 hour  Intake 60 ml  Output -  Net 60 ml   Filed Weights   10/29/18 0422  Weight: 92.2 kg    Examination:  Awake Alert, Oriented X 3, No new F.N deficits, Normal affect Symmetrical Chest wall movement, diminished air movement bilaterally, no wheezing RRR,No Gallops,Rubs or new Murmurs, No Parasternal Heave +ve B.Sounds, Abd Soft, No tenderness, No rebound - guarding or rigidity. No Cyanosis, Clubbing or edema, No new Rash or bruise         Data Reviewed: I have personally reviewed following labs and imaging studies:  CBC: Recent Labs  Lab 11/08/18 0500 11/08/18 0908 11/10/18 1105 11/11/18 0039 11/12/18 0528 11/13/18 0608  WBC 12.9*  --  14.1* 10.7* 8.9 7.3  HGB 13.4 13.6 14.8 13.9 13.3 13.7  HCT 40.8 40.0 44.0 42.6 41.1 42.7  MCV 92.3  --  93.0 93.4 94.3 96.0  PLT 115*  --  130* 105* 109* 254*   Basic Metabolic Panel: Recent Labs  Lab 11/07/18 0250 11/08/18 0500 11/08/18 0908 11/10/18 1105 11/11/18 0039 11/12/18 0528  NA 139 138 139 137 139 141  K 4.3 4.4 4.0 4.1 4.1 4.1  CL 103 104  --  102 106 108  CO2 25 22  --  24 24 27   GLUCOSE 118* 115*  --  134* 117* 108*  BUN  57* 49*  --  33* 32* 37*  CREATININE 1.27* 1.14  --  1.11 1.05 1.27*  CALCIUM 8.1* 8.1*  --  8.0* 8.0* 8.2*  MG 2.5*  --   --   --   --  2.2   GFR: Estimated Creatinine Clearance: 57.4 mL/min (A) (by C-G formula based on SCr of 1.27 mg/dL (H)). Liver Function Tests: Recent Labs  Lab 11/07/18 0250 11/10/18 1105 11/11/18 0039 11/12/18 0528  AST 42* 35 29 31  ALT 80* 55* 46* 43  ALKPHOS 80 81 73 68  BILITOT 1.2 1.0 1.2 1.0  PROT 5.6* 5.8* 5.5* 5.2*  ALBUMIN 2.6* 2.8* 2.7* 2.7*   No results for input(s): LIPASE, AMYLASE in the last 168 hours. No results for input(s): AMMONIA in the last 168 hours. Coagulation Profile: No results for input(s): INR, PROTIME in the last 168 hours. Cardiac Enzymes: No results for input(s): CKTOTAL, CKMB, CKMBINDEX, TROPONINI in the last 168 hours. BNP (last 3 results) No results for input(s): PROBNP in the last 8760 hours. HbA1C: No results for input(s): HGBA1C in the last  72 hours. CBG: Recent Labs  Lab 11/08/18 0909 11/08/18 1117 11/08/18 1634 11/08/18 2154 11/09/18 0741  GLUCAP 112* 153* 115* 143* 113*   Lipid Profile: No results for input(s): CHOL, HDL, LDLCALC, TRIG, CHOLHDL, LDLDIRECT in the last 72 hours. Thyroid Function Tests: No results for input(s): TSH, T4TOTAL, FREET4, T3FREE, THYROIDAB in the last 72 hours. Anemia Panel: Recent Labs    11/12/18 0528  FERRITIN 76   Urine analysis:    Component Value Date/Time   COLORURINE YELLOW 04/24/2018 Alex [name] 04/24/2018 1042   LABSPEC 1.008 04/24/2018 1042   PHURINE 5.0 04/24/2018 1042   GLUCOSEU NEGATIVE 04/24/2018 1042   HGBUR MODERATE (A) 04/24/2018 1042   BILIRUBINUR NEGATIVE 04/24/2018 1042   KETONESUR NEGATIVE 04/24/2018 1042   PROTEINUR 30 (A) 04/24/2018 1042   NITRITE NEGATIVE 04/24/2018 1042   LEUKOCYTESUR NEGATIVE 04/24/2018 1042   Sepsis Labs: @LABRCNTIP (procalcitonin:4,lacticacidven:4)  ) Recent Results (from the past 240 hour(s))  MRSA PCR  Screening     Status: None   Collection Time: 11/08/18 10:31 AM   Specimen: Nasal Mucosa; Nasopharyngeal  Result Value Ref Range Status   MRSA by PCR NEGATIVE NEGATIVE Final    Comment:        The GeneXpert MRSA Assay (FDA approved for NASAL specimens only), is one component of a comprehensive MRSA colonization surveillance program. It is not intended to diagnose MRSA infection nor to guide or monitor treatment for MRSA infections. Performed at [facility], [place] [facility] [address] [place], [place] [address]          Radiology Studies: Dg Chest Port 1 View  Result Date: 11/12/2018 CLINICAL DATA:  Respiratory failure. EXAM: PORTABLE CHEST 1 VIEW COMPARISON:  11/10/2018. FINDINGS: Stable cardiomegaly. Bilateral pulmonary infiltrates particularly prominent in the peripheries of both lungs. No interim change. No pleural effusion or pneumothorax. Scratch degenerative changes and scoliosis thoracic spine. IMPRESSION: Persistent bilateral pulmonary infiltrates, no change from prior exam. Electronically Signed   By: [place]   On: 11/12/2018 06:18        Scheduled Meds: . acyclovir  400 mg Oral BID  . apixaban  5 mg Oral BID  . aspirin EC  81 mg Oral Daily  . atorvastatin  40 mg Oral QHS  . Chlorhexidine Gluconate Cloth  6 each Topical Daily  . cholecalciferol  1,000 Units Oral BID  . docusate sodium  100 mg Oral Daily  . ezetimibe  10 mg Oral Daily  . feeding supplement (ENSURE ENLIVE)  237 mL Oral BID BM  . folic acid  1 mg Oral QHS  . guaiFENesin  1,200 mg Oral BID  . hydroxychloroquine  200 mg Oral BID  . magnesium citrate  0.5 Bottle Oral Once  . multivitamin with minerals  1 tablet Oral Daily  . pantoprazole  80 mg Oral Daily  . polyethylene glycol  17 g Oral Daily  . Ensure Max Protein  11 oz Oral Daily  . psyllium  1 packet Oral Daily  . vitamin C  1,000 mg Oral BID   Continuous Infusions:   LOS: 15 days      Phillips Climes, MD [facility] 11/13/2018, 10:28 AM     Please page through [place]:  [email or web address] Password [email or web address] If 7PM-7AM, please contact night-coverage

## 2018-11-13 NOTE — Progress Notes (Signed)
PT Cancellation Note  Patient Details Name: Alex [name]. MRN: [id number] DOB: [birth year]   Cancelled Treatment:    Reason Eval/Treat Not Completed: Fatigue/lethargy limiting ability to participate, states that he is worn out. Sat up all morning and had a bath.   Claretha Cooper 11/13/2018, 3:11 PM  [place]  Office [phone]

## 2018-11-13 NOTE — Plan of Care (Signed)
  Problem: Education: Goal: Knowledge of risk factors and measures for prevention of condition will improve Outcome: Progressing   Problem: Coping: Goal: Psychosocial and spiritual needs will be supported Outcome: Progressing   Problem: Respiratory: Goal: Will maintain a patent airway Outcome: Progressing Goal: Complications related to the disease process, condition or treatment will be avoided or minimized Outcome: Progressing   Problem: Education: Goal: Knowledge of General Education information will improve Description: Including pain rating scale, medication(s)/side effects and non-pharmacologic comfort measures Outcome: Progressing   Problem: Health Behavior/Discharge Planning: Goal: Ability to manage health-related needs will improve Outcome: Progressing   Problem: Clinical Measurements: Goal: Ability to maintain clinical measurements within normal limits will improve Outcome: Progressing Goal: Will remain free from infection Outcome: Progressing Goal: Diagnostic test results will improve Outcome: Progressing Goal: Respiratory complications will improve Outcome: Progressing Goal: Cardiovascular complication will be avoided Outcome: Progressing   Problem: Activity: Goal: Risk for activity intolerance will decrease Outcome: Progressing   

## 2018-11-13 NOTE — Progress Notes (Signed)
Assisted patient with Facetime visit with wife. Updated wife on Facetime and answered all questions.

## 2018-11-14 ENCOUNTER — Other Ambulatory Visit: Payer: Self-pay

## 2018-11-14 DIAGNOSIS — R05 Cough: Secondary | ICD-10-CM

## 2018-11-14 LAB — COMPREHENSIVE METABOLIC PANEL
ALT: 39 U/L (ref 0–44)
AST: 29 U/L (ref 15–41)
Albumin: 2.8 g/dL — ABNORMAL LOW (ref 3.5–5.0)
Alkaline Phosphatase: 66 U/L (ref 38–126)
Anion gap: 8 (ref 5–15)
BUN: 35 mg/dL — ABNORMAL HIGH (ref 8–23)
CO2: 27 mmol/L (ref 22–32)
Calcium: 8.3 mg/dL — ABNORMAL LOW (ref 8.9–10.3)
Chloride: 107 mmol/L (ref 98–111)
Creatinine, Ser: 1.19 mg/dL (ref 0.61–1.24)
GFR calc Af Amer: >60 mL/min (ref >60)
GFR calc non Af Amer: 59 mL/min — ABNORMAL LOW (ref >60)
Glucose, Bld: 99 mg/dL (ref 70–99)
Potassium: 4.9 mmol/L (ref 3.5–5.1)
Sodium: 142 mmol/L (ref 135–145)
Total Bilirubin: 1 mg/dL (ref 0.3–1.2)
Total Protein: 5.3 g/dL — ABNORMAL LOW (ref 6.5–8.1)

## 2018-11-14 LAB — CBC
HCT: 40.9 % (ref 39.0–52.0)
Hemoglobin: 12.9 g/dL — ABNORMAL LOW (ref 13.0–17.0)
MCH: 30.3 pg (ref 26.0–34.0)
MCHC: 31.5 g/dL (ref 30.0–36.0)
MCV: 96 fL (ref 80.0–100.0)
Platelets: 96 10*3/uL — ABNORMAL LOW (ref 150–400)
RBC: 4.26 MIL/uL (ref 4.22–5.81)
RDW: 16.1 % — ABNORMAL HIGH (ref 11.5–15.5)
WBC: 7.4 10*3/uL (ref 4.0–10.5)
nRBC: 0 % (ref 0.0–0.2)

## 2018-11-14 LAB — C-REACTIVE PROTEIN: CRP: <0.8 mg/dL (ref <1.0)

## 2018-11-14 LAB — MAGNESIUM: Magnesium: 2.2 mg/dL (ref 1.7–2.4)

## 2018-11-14 LAB — LACTATE DEHYDROGENASE: LDH: 258 U/L — ABNORMAL HIGH (ref 98–192)

## 2018-11-14 LAB — FERRITIN: Ferritin: 65 ng/mL (ref 24–336)

## 2018-11-14 NOTE — Progress Notes (Signed)
Telephone call to patient's wife, Alex [name], updated on plan of care and answered all questions.

## 2018-11-14 NOTE — Progress Notes (Signed)
PROGRESS NOTE  Alex [name]. MVH:[id number] DOB: Jan 06, 1941 DOA: 10/28/2018 PCP: [name], MD   LOS: 16 days   Brief Narrative / Interim history: 78 year old male with history of primary amyloidosis status post autologous stem cell transplant at [facility] 2018, on maintenance Ninlaro (however has been taken off of it for 2 weeks prior to this hospitalization), nephrotic syndrome baseline creatinine 1.1-1.2, chronic thrombocytopenia, hypertension, CAD who was admitted to the hospital on 10/28/2022 hypoxic respiratory failure in the setting of COVID-19.  He was maintained on the floor with tenuous respiratory status up until 7/16, when he developed chest pain, worsening hypoxia and had newly diagnosed pneumomediastinum.  He has been transferred back to the floor on 7/22.  Subjective: Feels tired this morning, denies any significant shortness of breath.  Feels like his breathing is improved.  He is asking when he can go home.  Denies any chest pain.  Assessment & Plan: Principal Problem:   Pneumonia due to COVID-19 virus Active Problems:   Essential hypertension   AL amyloidosis (HCC)   Acute respiratory failure with hypoxia (HCC)   Thrombocytopenia (HCC)   Principal Problem Acute hypoxic respiratory failure due to COVID-19 for pneumonia , In setting of ongoing 2020 COVID-19 pandemic.  Admitted 7/5 S/p remdesivir 7/6-7/10 S/p steroids 7/6-7/15 S/p Actemra 7/11 S/p plasma 7/10  -Patient was transferred to the ICU on 7/16 due to chest pain, hypoxia, and newly diagnosed pneumomediastinum.  An echo done on 7/16 showed normal EF, known mild to moderate AS, no effusion. Overall has remained quite hypoxic but stable, still on 8L HFNC -Patient did request Plaquenil treatment, Dr. Waldron Labs have discussed with him, expressed concerns about potential side effects of Plaquenil including QTC prolongation/arrhythmia, and liver injury, patient wishes to proceed anyway , I have explained  for him uncertainty of its benefits for COVID-19 , but patient would like to try it, he was started on Plaquenil, we will monitor him closely on telemetry for QTC prolongation, and will monitor his liver function, so far QTc within normal limit, no significant changes in LFTs. Today is day 5 out of 7 -check QTC daily (450 today) -continue zinc and Vitamin C  Active problems: Pneumomediastinum -Patient developed ectopy, increased work of breathing/dyspnea and chest pain after BM 7/15. Remained stable.  -Supportive care - Avoid positive intrathoracic pressure - Continue Morphine 2 mg IV PRN for pain control - Continue docusate, avoid valsalva  Acute pulmonary embolism -diagnosed on 7/11, subsegmental. Initially placed on heparin the transitioned to Eliquis. Clot burden not fully explaining degree hypoxia  -Continue Eliquis  History primary amyloidosis on Ninlaro S/p autologous stemm cell transplant 2018 -Had been on maintenance Ninlaro (monoclonal Ab proteasome inhibitor, promotes clonal myeloma cell apoptosis, some immunomodulatory/immunesuppressant activity). Not on it since 2 weeks prior to this hospitalization.  -Continue with acyclovir prophylaxis -continue to hold Ninlaro, defer to outpatient oncology when and if to resume   Coronary disease secondary prevention -Continue aspirin, Lipitor, Zetia  Hypertension -Hold Lasix, Imdur, losartan -May have PRN hydralazine if rebound hypertension  Hyperglycemia -Hyperglycemia resolved.  No prior history diabetes  Other medications -Continue PPI  Chronic thrombocytopenia -Stable  CKD III -Stable  Transaminitis -Mild, secondary to COVID, stable,   Scheduled Meds: . acyclovir  400 mg Oral BID  . apixaban  5 mg Oral BID  . aspirin EC  81 mg Oral Daily  . atorvastatin  40 mg Oral QHS  . Chlorhexidine Gluconate Cloth  6 each Topical Daily  . cholecalciferol  1,000 Units Oral BID  . docusate sodium  100 mg Oral Daily  .  ezetimibe  10 mg Oral Daily  . feeding supplement (ENSURE ENLIVE)  237 mL Oral BID BM  . folic acid  1 mg Oral QHS  . guaiFENesin  1,200 mg Oral BID  . hydroxychloroquine  200 mg Oral BID  . magnesium citrate  0.5 Bottle Oral Once  . multivitamin with minerals  1 tablet Oral Daily  . pantoprazole  80 mg Oral Daily  . polyethylene glycol  17 g Oral Daily  . Ensure Max Protein  11 oz Oral Daily  . psyllium  1 packet Oral Daily  . vitamin C  1,000 mg Oral BID   Continuous Infusions: PRN Meds:.acetaminophen **OR** acetaminophen, albuterol, alum & mag hydroxide-simeth, alum hydroxide-mag trisilicate, bisacodyl, calcium carbonate, guaiFENesin-dextromethorphan, hydrALAZINE, morphine injection, ondansetron, sodium chloride  DVT prophylaxis: Eliquis  Code Status: Full code Family Communication: will call wife later today  Disposition Plan: home when stable.   Consultants:   PCCM  Procedures:   None   Antimicrobials:  None    Objective: Vitals:   11/14/18 0441 11/14/18 0800 11/14/18 0900 11/14/18 1132  BP: 103/68 106/73 106/73 91/66  Pulse: 72  72   Resp: 20  19   Temp: 97.9 F (36.6 C) 98.2 F (36.8 C)  97.9 F (36.6 C)  TempSrc: Oral Oral  Oral  SpO2: 95%  94%   Weight:      Height:        Intake/Output Summary (Last 24 hours) at 11/14/2018 1319 Last data filed at 11/13/2018 2000 Gross per 24 hour  Intake 360 ml  Output -  Net 360 ml   Filed Weights   10/29/18 0422  Weight: 92.2 kg    Examination:  Constitutional: NAD Eyes: PERRL, lids and conjunctivae normal ENMT: Mucous membranes are moist. Respiratory: diminished at the bases, faint rhonchi  Cardiovascular: Regular rate and rhythm, no murmurs / rubs / gallops. No LE edema. 2+ pedal pulses.  Abdomen: no tenderness. Bowel sounds positive.  Musculoskeletal: no clubbing / cyanosis.  Skin: no rashes Neurologic: CN 2-12 grossly intact. Strength 5/5 in all 4.  Psychiatric: Normal judgment and insight. Alert  and oriented x 3. Normal mood.    Data Reviewed: I have independently reviewed following labs and imaging studies   CBC: Recent Labs  Lab 11/10/18 1105 11/11/18 0039 11/12/18 0528 11/13/18 0608 11/14/18 0400  WBC 14.1* 10.7* 8.9 7.3 7.4  HGB 14.8 13.9 13.3 13.7 12.9*  HCT 44.0 42.6 41.1 42.7 40.9  MCV 93.0 93.4 94.3 96.0 96.0  PLT 130* 105* 109* 101* 96*   Basic Metabolic Panel: Recent Labs  Lab 11/10/18 1105 11/11/18 0039 11/12/18 0528 11/13/18 0608 11/14/18 0400  NA 137 139 141 141 142  K 4.1 4.1 4.1 4.3 4.9  CL 102 106 108 106 107  CO2 24 24 27 26 27   GLUCOSE 134* 117* 108* 94 99  BUN 33* 32* 37* 34* 35*  CREATININE 1.11 1.05 1.27* 1.22 1.19  CALCIUM 8.0* 8.0* 8.2* 8.1* 8.3*  MG  --   --  2.2 2.3 2.2   GFR: Estimated Creatinine Clearance: 61.2 mL/min (by C-G formula based on SCr of 1.19 mg/dL). Liver Function Tests: Recent Labs  Lab 11/10/18 1105 11/11/18 0039 11/12/18 0528 11/13/18 0608 11/14/18 0400  AST 35 29 31 33 29  ALT 55* 46* 43 45* 39  ALKPHOS 81 73 68 66 66  BILITOT 1.0 1.2 1.0 1.0 1.0  PROT 5.8* 5.5* 5.2* 5.6* 5.3*  ALBUMIN 2.8* 2.7* 2.7* 3.0* 2.8*   No results for input(s): LIPASE, AMYLASE in the last 168 hours. No results for input(s): AMMONIA in the last 168 hours. Coagulation Profile: No results for input(s): INR, PROTIME in the last 168 hours. Cardiac Enzymes: No results for input(s): CKTOTAL, CKMB, CKMBINDEX, TROPONINI in the last 168 hours. BNP (last 3 results) No results for input(s): PROBNP in the last 8760 hours. HbA1C: No results for input(s): HGBA1C in the last 72 hours. CBG: Recent Labs  Lab 11/08/18 0909 11/08/18 1117 11/08/18 1634 11/08/18 2154 11/09/18 0741  GLUCAP 112* 153* 115* 143* 113*   Lipid Profile: No results for input(s): CHOL, HDL, LDLCALC, TRIG, CHOLHDL, LDLDIRECT in the last 72 hours. Thyroid Function Tests: No results for input(s): TSH, T4TOTAL, FREET4, T3FREE, THYROIDAB in the last 72 hours.  Anemia Panel: Recent Labs    11/13/18 0608 11/14/18 0400  FERRITIN 76 65   Urine analysis:    Component Value Date/Time   COLORURINE YELLOW 04/24/2018 [place] 04/24/2018 1042   LABSPEC 1.008 04/24/2018 1042   PHURINE 5.0 04/24/2018 1042   GLUCOSEU NEGATIVE 04/24/2018 1042   HGBUR MODERATE (A) 04/24/2018 1042   BILIRUBINUR NEGATIVE 04/24/2018 1042   [place] 04/24/2018 1042   PROTEINUR 30 (A) 04/24/2018 1042   NITRITE NEGATIVE 04/24/2018 1042   LEUKOCYTESUR NEGATIVE 04/24/2018 1042   Sepsis Labs: Invalid input(s): PROCALCITONIN, LACTICIDVEN  Recent Results (from the past 240 hour(s))  MRSA PCR Screening     Status: None   Collection Time: 11/08/18 10:31 AM   Specimen: Nasal Mucosa; Nasopharyngeal  Result Value Ref Range Status   MRSA by PCR NEGATIVE NEGATIVE Final    Comment:        The GeneXpert MRSA Assay (FDA approved for NASAL specimens only), is one component of a comprehensive MRSA colonization surveillance program. It is not intended to diagnose MRSA infection nor to guide or monitor treatment for MRSA infections. Performed at [facility]: [address] [place] [address], [place], [place] [address]      Radiology Studies: No results found.  Marzetta Board, MD, PhD [facility]  Contact via  [email or web address]  [place] P: [phone] F: [phone]

## 2018-11-14 NOTE — Plan of Care (Signed)
  Problem: Education: Goal: Knowledge of risk factors and measures for prevention of condition will improve Outcome: Progressing   Problem: Coping: Goal: Psychosocial and spiritual needs will be supported Outcome: Progressing   Problem: Respiratory: Goal: Will maintain a patent airway Outcome: Progressing Goal: Complications related to the disease process, condition or treatment will be avoided or minimized Outcome: Progressing   Problem: Education: Goal: Knowledge of General Education information will improve Description: Including pain rating scale, medication(s)/side effects and non-pharmacologic comfort measures Outcome: Progressing   Problem: Health Behavior/Discharge Planning: Goal: Ability to manage health-related needs will improve Outcome: Progressing   Problem: Clinical Measurements: Goal: Ability to maintain clinical measurements within normal limits will improve Outcome: Progressing Goal: Will remain free from infection Outcome: Progressing

## 2018-11-14 NOTE — Progress Notes (Signed)
Telephone call to patient's spouse, Jasper Loser, no answer, will try again later.

## 2018-11-14 NOTE — Progress Notes (Signed)
Physical Therapy Treatment Patient Details Name: Alex [name]. MRN: [id number] DOB: 1941/02/24 Today's Date: 11/14/2018    History of Present Illness 78 year old male with history of amyloidosis, nephrotic syndrome, on immunosuppressive therapy and steroids, hypertension, coronary artery disease, hearing loss, cervical fusion, rt foot surgery x2, bil RTC repair, lt knee surgery, tigger finger releases was admitted to the hospital on 10/28/2018 with shortness of breath, was diagnosed with COVID-19 pneumonia and was admitted to the hospital. 7/11 +PE    PT Comments    Patient able to ambulate 20' , placed on 10 L HFNC to keep sats up as down to 84% on 6, then 8 L HFNC. At rest on 6 L with sats >90% . Patient is motivated to mobilize as able .  Follow Up Recommendations  Home health PT     Equipment Recommendations  None recommended by PT    Recommendations for Other Services       Precautions / Restrictions Precautions Precautions: None Precaution Comments: watch O2 sats; Don't have pt "bear down"/vagal down Restrictions Weight Bearing Restrictions: No    Mobility  Bed Mobility Overal bed mobility: Needs Assistance Bed Mobility: Supine to Sit     Supine to sit: Min guard     General bed mobility comments: extra time  Transfers Overall transfer level: Needs assistance Equipment used: Rolling walker (2 wheeled) Transfers: Sit to/from [facility] Sit to Stand: Min guard Stand pivot transfers: Min guard       General transfer comment: Min Guard A for safety  Ambulation/Gait Ambulation/Gait assistance: Min guard;+2 safety/equipment Gait Distance (Feet): 20 Feet Assistive device: Rolling walker (2 wheeled) Gait Pattern/deviations: Step-through pattern     General Gait Details: slow  speed, cues to stay slow.   Stairs             Wheelchair Mobility    Modified Rankin (Stroke Patients Only)       Balance Overall balance  assessment: Needs assistance Sitting-balance support: Feet supported Sitting balance-Leahy Scale: Good     Standing balance support: Bilateral upper extremity supported;During functional activity Standing balance-Leahy Scale: Poor Standing balance comment: Reliant on physical A                            Cognition Arousal/Alertness: Awake/alert Behavior During Therapy: WFL for tasks assessed/performed Overall Cognitive Status: Within Functional Limits for tasks assessed                                 General Comments: Motivated to participate in therapy and follow commands      Exercises      General Comments General comments (skin integrity, edema, etc.): SpO2 maintaining >88% on 10L O2 via HFNC during activity. At rest, weaned to 6L O2 and pt maintaining >92%.      Pertinent Vitals/Pain Pain Assessment: Faces Faces Pain Scale: Hurts a little bit Pain Location: Chest with coughing Pain Descriptors / Indicators: Discomfort;Grimacing Pain Intervention(s): Monitored during session;Limited activity within patient's tolerance    Home Living                      Prior Function            PT Goals (current goals can now be found in the care plan section) Acute Rehab PT Goals Patient Stated Goal: go home to his bride Progress towards  PT goals: Progressing toward goals    Frequency    Min 3X/week      PT Plan Current plan remains appropriate    Co-evaluation PT/OT/SLP Co-Evaluation/Treatment: Yes Reason for Co-Treatment: Complexity of the patient's impairments (multi-system involvement);For patient/therapist safety PT goals addressed during session: Mobility/safety with mobility OT goals addressed during session: ADL's and self-care      AM-PAC PT "6 Clicks" Mobility   Outcome Measure  Help needed turning from your back to your side while in a flat bed without using bedrails?: A Little Help needed moving from lying on your  back to sitting on the side of a flat bed without using bedrails?: A Little Help needed moving to and from a bed to a chair (including a wheelchair)?: A Little Help needed standing up from a chair using your arms (e.g., wheelchair or bedside chair)?: A Little Help needed to walk in hospital room?: A Lot Help needed climbing 3-5 steps with a railing? : Total 6 Click Score: 15    End of Session Equipment Utilized During Treatment: Oxygen Activity Tolerance: Patient tolerated treatment well Patient left: in chair;with call bell/phone within reach;with nursing/sitter in room Nurse Communication: Mobility status;Other (comment) PT Visit Diagnosis: Muscle weakness (generalized) (M62.81);Difficulty in walking, not elsewhere classified (R26.2)     Time: 6237-6283 PT Time Calculation (min) (ACUTE ONLY): 25 min  Charges:  $Gait Training: 8-22 mins                     [place]  Office [phone]    Claretha Cooper 11/14/2018, 2:20 PM

## 2018-11-14 NOTE — Progress Notes (Signed)
Occupational Therapy Treatment Patient Details Name: Alex [name]. MRN: [id number] DOB: 1940-11-16 Today's Date: 11/14/2018    History of present illness 78 year old male with history of amyloidosis, nephrotic syndrome, on immunosuppressive therapy and steroids, hypertension, coronary artery disease, hearing loss, cervical fusion, rt foot surgery x2, bil RTC repair, lt knee surgery, tigger finger releases was admitted to the hospital on 10/28/2018 with shortness of breath, was diagnosed with COVID-19 pneumonia and was admitted to the hospital. 7/11 +PE   OT comments  Pt progressing towards established OT goals and is very motivated to participate in therapy. At rest, pt SpO2 >90% on 8L O2 via HFNC. Pt performing functional mobility to recliner with Min Guard A and RW. SpO2 >87% on 10L via HFNC during mobility. Once in recliner, pt performing oral care and brushing his hair. Able to wean O2 down to 6L at rest and maintain >90%. Continue to recommend dc to home with HHOT and will continue to follow acutely as admitted.    Follow Up Recommendations  Home health OT;Supervision/Assistance - 24 hour    Equipment Recommendations  3 in 1 bedside commode    Recommendations for Other Services      Precautions / Restrictions Precautions Precautions: Fall Precaution Comments: watch O2 sats; Don't have pt "bear down"/vagal down Restrictions Weight Bearing Restrictions: No       Mobility Bed Mobility Overal bed mobility: Needs Assistance Bed Mobility: Supine to Sit     Supine to sit: Min guard        Transfers Overall transfer level: Needs assistance Equipment used: Rolling walker (2 wheeled) Transfers: Sit to/from [facility] Sit to Stand: Min guard         General transfer comment: Min Guard A for safety    Balance Overall balance assessment: Needs assistance Sitting-balance support: Feet supported Sitting balance-Leahy Scale: Good     Standing  balance support: Bilateral upper extremity supported;During functional activity Standing balance-Leahy Scale: Poor Standing balance comment: Reliant on physical A                           ADL either performed or assessed with clinical judgement   ADL Overall ADL's : Needs assistance/impaired     Grooming: Set up;Sitting;Oral care;Brushing hair Grooming Details (indicate cue type and reason): Set up for oral care while seated in recliner.                Lower Body Dressing Details (indicate cue type and reason): Pt able to adjust socks while seated at EOB Toilet Transfer: +2 for safety/equipment;Ambulation;RW;Min guard(Simulated to recliner) Toilet Transfer Details (indicate cue type and reason): Min Guard A for safety and balance during mobility.          Functional mobility during ADLs: Min guard;Rolling walker;Cueing for safety       Vision       Perception     Praxis      Cognition Arousal/Alertness: Awake/alert Behavior During Therapy: WFL for tasks assessed/performed Overall Cognitive Status: Within Functional Limits for tasks assessed                                 General Comments: Motivated to participate in therapy and follow commands        Exercises     Shoulder Instructions       General Comments SpO2 maintaining >88% on 10L O2  via HFNC during activity. At rest, weaned to 6L O2 and pt maintaining >92%.    Pertinent Vitals/ Pain       Pain Assessment: Faces Faces Pain Scale: Hurts a little bit Pain Location: Chest with coughing Pain Descriptors / Indicators: Discomfort;Grimacing Pain Intervention(s): Monitored during session;Repositioned  Home Living                                          Prior Functioning/Environment              Frequency  Min 3X/week        Progress Toward Goals  OT Goals(current goals can now be found in the care plan section)  Progress towards OT goals:  Progressing toward goals  Acute Rehab OT Goals Patient Stated Goal: go home to his bride OT Goal Formulation: With patient Time For Goal Achievement: 11/26/18 Potential to Achieve Goals: Good ADL Goals Pt Will Perform Upper Body Bathing: with modified independence;sitting Pt Will Perform Lower Body Bathing: with modified independence;sit to/from stand Pt Will Perform Upper Body Dressing: with modified independence;sitting Pt Will Perform Lower Body Dressing: with modified independence;sit to/from stand Pt Will Transfer to Toilet: with modified independence;ambulating Pt Will Perform Toileting - Clothing Manipulation and hygiene: with modified independence;sit to/from stand Pt/caregiver will Perform Home Exercise Program: Increased strength;Both right and left upper extremity;With theraband;With written HEP provided;Independently Additional ADL Goal #1: Pt will independently verbalize 3 strategies for energy conservation  Plan Discharge plan remains appropriate    Co-evaluation                 AM-PAC OT "6 Clicks" Daily Activity     Outcome Measure   Help from another person eating meals?: None Help from another person taking care of personal grooming?: A Little Help from another person toileting, which includes using toliet, bedpan, or urinal?: A Little Help from another person bathing (including washing, rinsing, drying)?: A Lot Help from another person to put on and taking off regular upper body clothing?: A Little Help from another person to put on and taking off regular lower body clothing?: A Little 6 Click Score: 18    End of Session Equipment Utilized During Treatment: Rolling walker;Oxygen(10-6L)  OT Visit Diagnosis: Unsteadiness on feet (R26.81);Muscle weakness (generalized) (M62.81);Pain Pain - part of body: (back)   Activity Tolerance Patient tolerated treatment well   Patient Left in chair;with call bell/phone within reach   Nurse Communication Mobility  status        Time: 3154-0086 OT Time Calculation (min): 27 min  Charges: OT General Charges $OT Visit: 1 Visit OT Treatments $Self Care/Home Management : 8-22 mins  [place], OTR/L Acute Rehab Pager: [phone] Office: [place] 11/14/2018, 2:04 PM

## 2018-11-15 ENCOUNTER — Other Ambulatory Visit: Payer: Self-pay

## 2018-11-15 ENCOUNTER — Inpatient Hospital Stay (HOSPITAL_COMMUNITY): Payer: Non-veteran care

## 2018-11-15 LAB — COMPREHENSIVE METABOLIC PANEL
ALT: 33 U/L (ref 0–44)
AST: 26 U/L (ref 15–41)
Albumin: 2.8 g/dL — ABNORMAL LOW (ref 3.5–5.0)
Alkaline Phosphatase: 63 U/L (ref 38–126)
Anion gap: 7 (ref 5–15)
BUN: 32 mg/dL — ABNORMAL HIGH (ref 8–23)
CO2: 27 mmol/L (ref 22–32)
Calcium: 8.1 mg/dL — ABNORMAL LOW (ref 8.9–10.3)
Chloride: 107 mmol/L (ref 98–111)
Creatinine, Ser: 1.17 mg/dL (ref 0.61–1.24)
GFR calc Af Amer: >60 mL/min (ref >60)
GFR calc non Af Amer: >60 mL/min (ref >60)
Glucose, Bld: 85 mg/dL (ref 70–99)
Potassium: 4.1 mmol/L (ref 3.5–5.1)
Sodium: 141 mmol/L (ref 135–145)
Total Bilirubin: 0.9 mg/dL (ref 0.3–1.2)
Total Protein: 5.1 g/dL — ABNORMAL LOW (ref 6.5–8.1)

## 2018-11-15 LAB — CBC
HCT: 37 % — ABNORMAL LOW (ref 39.0–52.0)
Hemoglobin: 12 g/dL — ABNORMAL LOW (ref 13.0–17.0)
MCH: 31.3 pg (ref 26.0–34.0)
MCHC: 32.4 g/dL (ref 30.0–36.0)
MCV: 96.6 fL (ref 80.0–100.0)
Platelets: 84 10*3/uL — ABNORMAL LOW (ref 150–400)
RBC: 3.83 MIL/uL — ABNORMAL LOW (ref 4.22–5.81)
RDW: 16.1 % — ABNORMAL HIGH (ref 11.5–15.5)
WBC: 6 10*3/uL (ref 4.0–10.5)
nRBC: 0 % (ref 0.0–0.2)

## 2018-11-15 LAB — C-REACTIVE PROTEIN: CRP: <0.8 mg/dL (ref <1.0)

## 2018-11-15 LAB — LACTATE DEHYDROGENASE: LDH: 237 U/L — ABNORMAL HIGH (ref 98–192)

## 2018-11-15 LAB — D-DIMER, QUANTITATIVE: D-Dimer, Quant: 1.27 ug/mL-FEU — ABNORMAL HIGH (ref 0.00–0.50)

## 2018-11-15 LAB — FERRITIN: Ferritin: 56 ng/mL (ref 24–336)

## 2018-11-15 NOTE — Progress Notes (Signed)
Physical Therapy Treatment Patient Details Name: Alex [name]. MRN: [id number] DOB: 1941-01-29 Today's Date: 11/15/2018    History of Present Illness 78 year old male with history of amyloidosis, nephrotic syndrome, on immunosuppressive therapy and steroids, hypertension, coronary artery disease, hearing loss, cervical fusion, rt foot surgery x2, bil RTC repair, lt knee surgery, tigger finger releases was admitted to the hospital on 10/28/2018 with shortness of breath, was diagnosed with COVID-19 pneumonia and was admitted to the hospital. 7/11 +PE. + for pneumomediastinum    PT Comments    The patient was instructed in HEP for UE and LE's and provided written instructions. Patient instructed to start with 5 reps and increase as tolerated. Instructed to begin standing exercises as he improves and to ambulate shor distances and frequently. Patient is looking forward to returning home.  patient on 1 L [place] with SaO2 dropping to 84% when exerts but recovers to 90%.  At rest in bed, 95%  Follow Up Recommendations  Home health PT     Equipment Recommendations  (pt reports that he can get a RW from [facility])    Recommendations for Other Services       Precautions / Restrictions Precautions Precaution Comments: watch O2 sats; Don't have pt "bear down"/vagal down    Mobility  Bed Mobility Overal bed mobility: Needs Assistance             General bed mobility comments: extra time, verbal cues to log roll, place legs over and then push to sitting vs. sit straight up. Verbal instruction to do reverse to retunrn to bed.  Transfers   Equipment used: None Transfers: Sit to/from [employer] to Stand: Min guard Stand pivot transfers: Min guard       General transfer comment: patient transferred to recliner, did not use RW  Ambulation/Gait             General Gait Details: pt declined due to fatigue and had amb w/ RN   Stairs              Wheelchair Mobility    Modified Rankin (Stroke Patients Only)       Balance                                            Cognition   Behavior During Therapy: [facility] for tasks assessed/performed;Anxious                                   General Comments: anxious with SOB      Exercises General Exercises - Upper Extremity Shoulder Flexion: AROM;Both;10 reps Shoulder ABduction: AROM;Both;10 reps Elbow Flexion: Strengthening;Both;10 reps;Seated General Exercises - Lower Extremity Long Arc Quad: AROM;Both;10 reps;Seated Heel Slides: AROM;Both;5 reps;Supine Hip ABduction/ADduction: AROM;Both;5 reps;Supine Straight Leg Raises: AROM;Both;5 reps;Supine Hip Flexion/Marching: AROM;Both;10 reps;Seated Toe Raises: AROM;Both;10 reps;Seated Heel Raises: AROM;Both;10 reps;Supine Other Exercises Other Exercises: instructed in standing exercises to progress to when he feels up to it: heel toe raises, hip abd and hip flexion    General Comments        Pertinent Vitals/Pain Pain Assessment: No/denies pain    Home Living                      Prior Function  PT Goals (current goals can now be found in the care plan section) Progress towards PT goals: Progressing toward goals    Frequency    Min 3X/week      PT Plan Current plan remains appropriate    Co-evaluation              AM-PAC PT "6 Clicks" Mobility   Outcome Measure  Help needed turning from your back to your side while in a flat bed without using bedrails?: None Help needed moving from lying on your back to sitting on the side of a flat bed without using bedrails?: None Help needed moving to and from a bed to a chair (including a wheelchair)?: A Little Help needed standing up from a chair using your arms (e.g., wheelchair or bedside chair)?: A Little Help needed to walk in hospital room?: A Lot Help needed climbing 3-5 steps with a railing? : Total 6  Click Score: 17    End of Session Equipment Utilized During Treatment: Oxygen Activity Tolerance: Patient tolerated treatment well Patient left: in chair;with call bell/phone within reach;with nursing/sitter in room Nurse Communication: Mobility status;Other (comment) PT Visit Diagnosis: Muscle weakness (generalized) (M62.81);Difficulty in walking, not elsewhere classified (R26.2)     Time: 4158-3094 PT Time Calculation (min) (ACUTE ONLY): 36 min  Charges:  $Therapeutic Exercise: 23-37 mins                     Tresa Endo PT Acute Rehabilitation Services Pager [phone] Office [phone]    Claretha Cooper 11/15/2018, 5:45 PM

## 2018-11-15 NOTE — Progress Notes (Signed)
Patient ambulated to room door on room air.  Oxygen saturation did drop to 75 percent, and required 4 liters to recover.  After patient recovered his oxygen was turned back down to 1 liter, since he seems to do good at rest.

## 2018-11-15 NOTE — Progress Notes (Signed)
Patient ambulated to his room door and back to bed.  His oxygen saturation was 91 percent on 2 liters nasal cannula.

## 2018-11-15 NOTE — Progress Notes (Signed)
Attempted to call patient's contact Mardene Celeste, but no answer.

## 2018-11-15 NOTE — Progress Notes (Signed)
PROGRESS NOTE  Alex [name]. 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  He was maintained on the floor with tenuous respiratory status up until 7/16, when he developed chest pain, worsening hypoxia and had newly diagnosed pneumomediastinum.  He has been transferred back to the floor on 7/22.  Subjective: [place] quite optimistic as his O2 needs improved and only on 2L HFNC this morning. Hopeful for discharge soon. Denies chest pain, no abdominal pain, no nausea/vomiting.   Assessment & Plan: Principal Problem:   Pneumonia due to COVID-19 virus Active Problems:   Essential hypertension   AL amyloidosis (HCC)   Acute respiratory failure with hypoxia (HCC)   Thrombocytopenia (HCC)   Principal Problem Acute hypoxic respiratory failure due to COVID-19 for pneumonia , In setting of ongoing 2020 COVID-19 pandemic.  Admitted 7/5 S/p remdesivir 7/6-7/10 S/p steroids 7/6-7/15 S/p Actemra 7/11 S/p plasma 7/10  -Patient was transferred to the ICU on 7/16 due to chest pain, hypoxia, and newly diagnosed pneumomediastinum.  An echo done on 7/16 showed normal EF, known mild to moderate AS, no effusion. Overall has remained quite hypoxic but stable, still on 8L HFNC -Patient did request Plaquenil treatment, Dr. Waldron Labs have discussed with him, expressed concerns about potential side effects of Plaquenil including QTC prolongation/arrhythmia, and liver injury, patient wishes to proceed anyway , I have explained  for him uncertainty of its benefits for COVID-19 , but patient would like to try it, he was started on Plaquenil, we will monitor him closely on telemetry for QTC prolongation, and will monitor his liver function, so far QTc within normal limit, no significant changes in LFTs. Today is day 5 out of 7 -check QTC daily (448 today, EKG reviewed) -continue zinc and Vitamin C] -hypoxia improving, will switch to regular [place], attempt to ambulate a bit later today to see how he does.   Active problems: Pneumomediastinum -Patient developed ectopy, increased work of breathing/dyspnea and chest pain after BM 7/15. Remained stable.  -Supportive care -Avoid positive intrathoracic pressure -Continue Morphine 2 mg IV PRN for pain control -Continue docusate, avoid valsalva -CXR this morning stable, reviewed   Acute pulmonary embolism -diagnosed on 7/11, subsegmental. Initially placed on heparin the transitioned to Eliquis. Clot burden not fully explaining degree hypoxia  -Continue Eliquis  History primary amyloidosis on Ninlaro S/p autologous stemm cell transplant 2018 -Had been on maintenance Ninlaro (monoclonal Ab proteasome inhibitor, promotes clonal myeloma cell apoptosis, some immunomodulatory/immunesuppressant activity). Not on it since 2 weeks prior to this hospitalization.  -Continue with acyclovir prophylaxis -continue to hold Ninlaro, defer to outpatient oncology when and if to resume   Coronary disease secondary prevention -Continue aspirin, Lipitor, Zetia  Hypertension -Hold Lasix, Imdur, losartan -May have PRN hydralazine if rebound hypertension -BP stable today   Hyperglycemia -Hyperglycemia resolved. CBGs stable  Other medications -Continue PPI  Chronic thrombocytopenia -Stable  CKD III -Stable  Transaminitis -Mild, secondary to COVID, stable,   Scheduled Meds:  acyclovir  400 mg Oral BID   apixaban  5 mg Oral BID   aspirin EC  81 mg  Oral Daily    atorvastatin  40 mg Oral QHS   cholecalciferol  1,000 Units Oral BID   docusate sodium  100 mg Oral Daily   ezetimibe  10 mg Oral Daily   feeding supplement (ENSURE ENLIVE)  237 mL Oral BID BM   folic acid  1 mg Oral QHS   guaiFENesin  1,200 mg Oral BID   magnesium citrate  0.5 Bottle Oral Once   multivitamin with minerals  1 tablet Oral Daily   pantoprazole  80 mg Oral Daily   polyethylene glycol  17 g Oral Daily   Ensure Max Protein  11 oz Oral Daily   psyllium  1 packet Oral Daily   vitamin C  1,000 mg Oral BID   Continuous Infusions: PRN Meds:.acetaminophen **OR** acetaminophen, albuterol, alum & mag hydroxide-simeth, alum hydroxide-mag trisilicate, bisacodyl, calcium carbonate, guaiFENesin-dextromethorphan, hydrALAZINE, morphine injection, ondansetron, sodium chloride  DVT prophylaxis: Eliquis  Code Status: Full code Family Communication: d/w wife over the phone 7/22 Disposition Plan: home when stable.   Consultants:   PCCM  Procedures:   None   Antimicrobials:  None    Objective: Vitals:   11/15/18 0034 11/15/18 0500 11/15/18 0542 11/15/18 0838  BP:  106/64  101/84  Pulse: 66  (!) 59 71  Resp: 20  16 (!) 24  Temp:  97.9 F (36.6 C)  98 F (36.7 C)  TempSrc:  Oral  Oral  SpO2: 99%  100% 95%  Weight:      Height:        Intake/Output Summary (Last 24 hours) at 11/15/2018 1103 Last data filed at 11/14/2018 1800 Gross per 24 hour  Intake 240 ml  Output 200 ml  Net 40 ml   Filed Weights   10/29/18 0422  Weight: 92.2 kg    Examination:  Constitutional: no distress Eyes: no scleral icterus  ENMT: Moist mucous membranes Respiratory: Faint bibasilar rhonchi, overall diminished, no wheezing or crackles Cardiovascular: Regular rate and rhythm, no murmurs appreciated.  No peripheral edema Abdomen: Soft, nontender, nondistended, positive bowel sounds Musculoskeletal: no clubbing / cyanosis.  Skin: No rashes seen Neurologic: No focal  deficits, equal strength Psychiatric: Normal judgment and insight. Alert and oriented x 3. Normal mood.    Data Reviewed: I have independently reviewed following labs and imaging studies   CBC: Recent Labs  Lab 11/11/18 0039 11/12/18 0528 11/13/18 0608 11/14/18 0400 11/15/18 0150  WBC 10.7* 8.9 7.3 7.4 6.0  HGB 13.9 13.3 13.7 12.9* 12.0*  HCT 42.6 41.1 42.7 40.9 37.0*  MCV 93.4 94.3 96.0 96.0 96.6  PLT 105* 109* 101* 96* 84*   Basic Metabolic Panel: Recent Labs  Lab 11/11/18 0039 11/12/18 0528 11/13/18 0608 11/14/18 0400 11/15/18 0150  NA 139 141 141 142 141  K 4.1 4.1 4.3 4.9 4.1  CL 106 108 106 107 107  CO2 24 27 26 27 27   GLUCOSE 117* 108* 94 99 85  BUN 32* 37* 34* 35* 32*  CREATININE 1.05 1.27* 1.22 1.19 1.17  CALCIUM 8.0* 8.2* 8.1* 8.3* 8.1*  MG  --  2.2 2.3 2.2  --    GFR: Estimated Creatinine Clearance: 62.3 mL/min (by C-G formula based on SCr of 1.17 mg/dL). Liver Function Tests: Recent Labs  Lab 11/11/18 0039 11/12/18 0528 11/13/18 0608 11/14/18 0400 11/15/18 0150  AST 29 31 33 29 26  ALT 46* 43 45* 39 33  ALKPHOS 73 68 66 66 63  BILITOT 1.2 1.0 1.0 1.0 0.9  PROT 5.5*  5.2* 5.6* 5.3* 5.1*  ALBUMIN 2.7* 2.7* 3.0* 2.8* 2.8*   No results for input(s): LIPASE, AMYLASE in the last 168 hours. No results for input(s): AMMONIA in the last 168 hours. Coagulation Profile: No results for input(s): INR, PROTIME in the last 168 hours. Cardiac Enzymes: No results for input(s): CKTOTAL, CKMB, CKMBINDEX, TROPONINI in the last 168 hours. BNP (last 3 results) No results for input(s): PROBNP in the last 8760 hours. HbA1C: No results for input(s): HGBA1C in the last 72 hours. CBG: Recent Labs  Lab 11/08/18 1117 11/08/18 1634 11/08/18 2154 11/09/18 0741  GLUCAP 153* 115* 143* 113*   Lipid Profile: No results for input(s): CHOL, HDL, LDLCALC, TRIG, CHOLHDL, LDLDIRECT in the last 72 hours. Thyroid Function Tests: No results for input(s): TSH, T4TOTAL,  FREET4, T3FREE, THYROIDAB in the last 72 hours. Anemia Panel: Recent Labs    11/14/18 0400 11/15/18 0150  FERRITIN 65 56   Urine analysis:    Component Value Date/Time   COLORURINE YELLOW 04/24/2018 [place] 04/24/2018 1042   LABSPEC 1.008 04/24/2018 1042   PHURINE 5.0 04/24/2018 1042   GLUCOSEU NEGATIVE 04/24/2018 1042   HGBUR MODERATE (A) 04/24/2018 1042   BILIRUBINUR NEGATIVE 04/24/2018 1042   [place] 04/24/2018 1042   PROTEINUR 30 (A) 04/24/2018 1042   NITRITE NEGATIVE 04/24/2018 1042   LEUKOCYTESUR NEGATIVE 04/24/2018 1042   Sepsis Labs: Invalid input(s): PROCALCITONIN, LACTICIDVEN  Recent Results (from the past 240 hour(s))  MRSA PCR Screening     Status: None   Collection Time: 11/08/18 10:31 AM   Specimen: Nasal Mucosa; Nasopharyngeal  Result Value Ref Range Status   MRSA by PCR NEGATIVE NEGATIVE Final    Comment:        The GeneXpert MRSA Assay (FDA approved for NASAL specimens only), is one component of a comprehensive MRSA colonization surveillance program. It is not intended to diagnose MRSA infection nor to guide or monitor treatment for MRSA infections. Performed at [facility], [place] [address], [place],  [address]      Radiology Studies: Dg Chest Port 1 View  Result Date: 11/15/2018 CLINICAL DATA:  Hypoxia EXAM: PORTABLE CHEST 1 VIEW COMPARISON:  11/12/2018 FINDINGS: Patchy peripheral airspace opacities are again noted, not significantly changed since prior study. Heart is borderline in size. No effusions. No acute bony abnormality. IMPRESSION: Stable patchy peripheral airspace opacities bilaterally compatible with pneumonia. Electronically Signed   By: Rolm Baptise M.D.   On: 11/15/2018 09:18    Marzetta Board, MD, PhD [facility]  Contact via  [email or web address]  [place] P: [phone] F: [phone]

## 2018-11-16 NOTE — Plan of Care (Signed)

## 2018-11-16 NOTE — TOC Initial Note (Signed)
Transition of Care (TOC) - Initial/Assessment Note    Patient Details  Name: Alex [name]. MRN: [id number] Date of Birth: 04-22-1941  Transition of Care [facility]) CM/SW Contact:    Weston Anna, LCSW Phone Number: 11/16/2018, 3:18 PM  Clinical Narrative:                  CSW spoke with patients spouse via phone to discuss discharge plans- spouse prefers to use [facility] and [employer] [facility]. DME referral for oxygen has been completed and will be delivered this afternoon.   [facility] is not able to accept referral at this time- referral was completed with [facility].   Expected Discharge Plan: [place] Barriers to Discharge: No Barriers Identified   Patient Goals and CMS Choice     Choice offered to / list presented to : Spouse  Expected Discharge Plan and Services Expected Discharge Plan: [place] Acute Care Choice: Durable Medical Equipment, Home Health Living arrangements for the past 2 months: Single Family Home                 DME Arranged: Oxygen DME Agency: [place] Date DME Agency Contacted: 11/16/18 Time DME Agency Contacted: 0932 Representative spoke with at DME Agency: Magda Paganini HH Arranged: RN, PT, OT [facility] Agency: [facility] Date [place]: 11/16/18 Time [place]: 1518 Representative spoke with at [place]: Glyn Ade  Prior Living Arrangements/Services Living arrangements for the past 2 months: Single Family Home Lives with:: Spouse Patient language and need for interpreter reviewed:: Yes Do you feel safe going back to the place where you live?: Yes      Need for Family Participation in Patient Care: Yes (Comment) Care giver support system in place?: Yes (comment) Current home services: DME, Home OT, Home PT, Home RN    Activities of Daily Living Home Assistive Devices/Equipment: None ADL Screening (condition at time of admission) Patient's cognitive ability  adequate to safely complete daily activities?: Yes Is the patient deaf or have difficulty hearing?: No Does the patient have difficulty seeing, even when wearing glasses/contacts?: No Does the patient have difficulty concentrating, remembering, or making decisions?: No Patient able to express need for assistance with ADLs?: Yes Does the patient have difficulty dressing or bathing?: No Independently performs ADLs?: Yes (appropriate for developmental age) Does the patient have difficulty walking or climbing stairs?: No Weakness of Legs: Both Weakness of Arms/Hands: None  Permission Sought/Granted                  Emotional Assessment           Psych Involvement: No (comment)  Admission diagnosis:  Shortness of breath [R06.02] Cough [R05] COVID-19 [U07.1, J98.8] Patient Active Problem List   Diagnosis Date Noted  . Pneumonia due to COVID-19 virus 10/29/2018  . Acute respiratory failure with hypoxia ([place]) 10/29/2018  . Thrombocytopenia ([place]) 10/29/2018  . Left lower lobe pneumonia ([place]) 04/22/2018  . Sepsis due to pneumonia ([place]) 04/22/2018  . Folliculitis 67/03/4579  . AL amyloidosis ([place]) 08/21/2016  . Umbilical hernia 99/83/3825  . DIZZINESS 02/19/2010  . CAD, NATIVE VESSEL 07/21/2009  . CHEST PAIN-UNSPECIFIED 07/21/2009  . HYPERLIPIDEMIA 04/30/2007  . Essential hypertension 04/30/2007  . GASTROESOPHAGEAL REFLUX DISEASE 04/30/2007  . SEIZURE DISORDER 04/30/2007  . COMPRESSION FRACTURE, L1 VERTEBRA 04/30/2007  . DIVERTICULOSIS, COLON, HX OF 04/30/2007  . Other postprocedural status(V45.89) 04/30/2007  PCP:  [name], MD Pharmacy:   [id number] - [place], [place] - [address] [place] [address] Phone: [phone] Fax: [place], [place] - [address] [place] [address] Phone: [phone] Fax: [phone]     Social Determinants of  Health (SDOH) Interventions    Readmission Risk Interventions No flowsheet data found.

## 2018-11-16 NOTE — Progress Notes (Signed)
Occupational Therapy Treatment Patient Details Name: Alex [name]. MRN: [id number] DOB: 1940-06-14 Today's Date: 11/16/2018    History of present illness 78 year old male with history of amyloidosis, nephrotic syndrome, on immunosuppressive therapy and steroids, hypertension, coronary artery disease, hearing loss, cervical fusion, rt foot surgery x2, bil RTC repair, lt knee surgery, tigger finger releases was admitted to the hospital on 10/28/2018 with shortness of breath, was diagnosed with COVID-19 pneumonia and was admitted to the hospital. 7/11 +PE. + for pneumomediastinum   OT comments  Pt progressing towards OT goals and is motivated to work with therapies. Pt mostly limited due to increased SOB with activity today. Pt performed mobility to bathroom using RW with overall minguard assist. Pt initially on 1L O2 upon start of session with O2 sats >95%. After mobility to bathroom pt requiring 4-5L O2 to maintain O2 sats in mid 80's (mostly hovering at 86%, lowest O2 sat noted post activity was 74%, requiring at least 5 min to return to mid 80s). Pt required significant time to recover with cues provided for deep breathing techniques. Pt performed additional stand pivot to recliner from [facility] in bathroom with sats maintaining in mid 80s; given increased time for recovery was able to return pt to 1.5L end of session with sats >90%. Discussion held with pt re: energy conservation strategies and activity progression after return home given his current WOB with activity; with pt verbalizing understanding. Pt wishing to return home as soon as he can. Will continue to follow acutely.   Follow Up Recommendations  Home health OT;Supervision/Assistance - 24 hour    Equipment Recommendations  3 in 1 bedside commode          Precautions / Restrictions Precautions Precautions: Other (comment) Precaution Comments: watch O2 sats; Don't have pt "bear down"/vagal down Restrictions Weight Bearing  Restrictions: No       Mobility Bed Mobility Overal bed mobility: Needs Assistance Bed Mobility: Supine to Sit     Supine to sit: Min guard     General bed mobility comments: extra time to transition to EOB, requires seated rest after transition   Transfers Overall transfer level: Needs assistance Equipment used: Rolling walker (2 wheeled) Transfers: Sit to/from Stand Sit to Stand: Min guard         General transfer comment: minguard for lines/safety; stood x3 during session    Balance Overall balance assessment: Needs assistance Sitting-balance support: Feet supported Sitting balance-Leahy Scale: Good     Standing balance support: Bilateral upper extremity supported;During functional activity Standing balance-Leahy Scale: Poor Standing balance comment: reliant on UE support                           ADL either performed or assessed with clinical judgement   ADL Overall ADL's : Needs assistance/impaired     Grooming: Set up;Supervision/safety;Sitting Grooming Details (indicate cue type and reason): setup for attempts at shaving ADL, razor which we used was not strong enough to achieve full shave so pt opted not to complete after few attempts                             Functional mobility during ADLs: Min guard;Minimal assistance;Rolling walker General ADL Comments: pt continues to have significant WOB with activity, requires increased time to recover; ambulated to bathroom and sat on [facility] for seated rest break at sink, ultimately brought recliner to bathroom doorway vs  ambulating back into room due to pt's SOB; much of remaining session spent on education of deep breathing techniques and energy conservation strategies/activity progression after return home                       Cognition Arousal/Alertness: Awake/alert Behavior During Therapy: [facility] for tasks assessed/performed;Anxious Overall Cognitive Status: Within Functional Limits  for tasks assessed                                 General Comments: anxious with SOB        Exercises     Shoulder Instructions       General Comments      Pertinent Vitals/ Pain       Pain Assessment: Faces Faces Pain Scale: Hurts little more Pain Location: chest with deep breathing Pain Descriptors / Indicators: Discomfort;Grimacing;Tightness Pain Intervention(s): Monitored during session;Limited activity within patient's tolerance;Repositioned  Home Living                                          Prior Functioning/Environment              Frequency  Min 3X/week        Progress Toward Goals  OT Goals(current goals can now be found in the care plan section)  Progress towards OT goals: Progressing toward goals  Acute Rehab OT Goals Patient Stated Goal: go home to his bride OT Goal Formulation: With patient Time For Goal Achievement: 11/26/18 Potential to Achieve Goals: Good ADL Goals Pt Will Perform Upper Body Bathing: with modified independence;sitting Pt Will Perform Lower Body Bathing: with modified independence;sit to/from stand Pt Will Perform Upper Body Dressing: with modified independence;sitting Pt Will Perform Lower Body Dressing: with modified independence;sit to/from stand Pt Will Transfer to Toilet: with modified independence;ambulating Pt Will Perform Toileting - Clothing Manipulation and hygiene: with modified independence;sit to/from stand Pt/caregiver will Perform Home Exercise Program: Increased strength;Both right and left upper extremity;With theraband;With written HEP provided;Independently Additional ADL Goal #1: Pt will independently verbalize 3 strategies for energy conservation  Plan Discharge plan remains appropriate    Co-evaluation                 AM-PAC OT "6 Clicks" Daily Activity     Outcome Measure   Help from another person eating meals?: None Help from another person taking care  of personal grooming?: A Little Help from another person toileting, which includes using toliet, bedpan, or urinal?: A Little Help from another person bathing (including washing, rinsing, drying)?: A Lot Help from another person to put on and taking off regular upper body clothing?: A Little Help from another person to put on and taking off regular lower body clothing?: A Little 6 Click Score: 18    End of Session Equipment Utilized During Treatment: Gait belt;Rolling walker;Oxygen  OT Visit Diagnosis: Unsteadiness on feet (R26.81);Muscle weakness (generalized) (M62.81)   Activity Tolerance Patient tolerated treatment well   Patient Left in chair;with call bell/phone within reach;with chair alarm set;with nursing/sitter in room   Nurse Communication Mobility status        Time: 8563-1497 OT Time Calculation (min): 61 min  Charges: OT General Charges $OT Visit: 1 Visit OT Treatments $Self Care/Home Management : 53-67 mins  Lou Cal, OT Supplemental Rehabilitation Services  Pager [phone] Office [phone]    Raymondo Band 11/16/2018, 2:15 PM

## 2018-11-16 NOTE — Progress Notes (Signed)
PROGRESS NOTE  Alex [name]. BJY:[id number] DOB: 1940-07-07 DOA: 10/28/2018 PCP: [name], MD   LOS: 18 days   Brief Narrative / Interim history: 78 year old male with history of primary amyloidosis status post autologous stem cell transplant at [facility] 2018, on maintenance Ninlaro (however has been taken off of it for 2 weeks prior to this hospitalization), nephrotic syndrome baseline creatinine 1.1-1.2, chronic thrombocytopenia, hypertension, CAD who was admitted to the hospital on 10/28/2022 hypoxic respiratory failure in the setting of COVID-19.  He was maintained on the floor with tenuous respiratory status up until 7/16, when he developed chest pain, worsening hypoxia and had newly diagnosed pneumomediastinum.  He has been transferred back to the floor on 7/22.  Subjective: Wants to go home.  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  Admitted 7/5 S/p remdesivir 7/6-7/10 S/p steroids 7/6-7/15 S/p Actemra 7/11 S/p plasma 7/10  -Patient was transferred to the ICU on 7/16 due to chest pain, hypoxia, and newly diagnosed pneumomediastinum.  An echo done on 7/16 showed normal EF, known mild to moderate AS, no effusion. Overall has remained quite hypoxic but stable, still on 8L HFNC -Patient did request Plaquenil treatment, Dr. Waldron Labs have discussed with him, expressed concerns about potential side effects of Plaquenil including QTC prolongation/arrhythmia, and liver injury, patient wishes to proceed anyway , I have explained for him uncertainty of its benefits for COVID-19 , but  patient would like to try it, he was started on Plaquenil, we will monitor him closely on telemetry for QTC prolongation, and will monitor his liver function, so far QTc within normal limit, no significant changes in LFTs. Today is day 5 out of 7 -check QTC daily (448 today, EKG reviewed) -continue zinc and Vitamin C] -He was switched to regular nasal cannula on 7/23, down to 1 L at rest however requiring up to 5 L to maintain oxygen sats in the mid 80s with minimal activity  Active problems: Pneumomediastinum -Patient developed ectopy, increased work of breathing/dyspnea and chest pain after BM 7/15. Remained stable.  -Supportive care -Avoid positive intrathoracic pressure -Continue Morphine 2 mg IV PRN for pain control -Continue docusate, avoid valsalva -Repeat chest x-ray in the morning  Acute pulmonary embolism -diagnosed on 7/11, subsegmental. Initially placed on heparin the transitioned to Eliquis. Clot burden not fully explaining degree hypoxia  -Continue Eliquis  History primary amyloidosis on Ninlaro S/p autologous stemm cell transplant 2018 -Had been on maintenance Ninlaro (monoclonal Ab proteasome inhibitor, promotes clonal myeloma cell apoptosis, some immunomodulatory/immunesuppressant activity). Not on it since 2 weeks prior to this hospitalization.  -Continue with acyclovir prophylaxis -continue to hold Ninlaro, defer to outpatient oncology when and if to resume   Coronary disease secondary prevention -Continue aspirin, Lipitor, Zetia  Hypertension -Hold Lasix, Imdur, losartan -May have PRN hydralazine if rebound hypertension -BP stable today   Hyperglycemia -Hyperglycemia resolved. CBGs stable  Other medications -Continue PPI  Chronic thrombocytopenia -Stable  CKD III -Stable  Transaminitis -Mild, secondary to COVID, stable,   Scheduled Meds:  acyclovir  400 mg Oral BID   apixaban  5 mg  Oral BID   aspirin EC  81 mg Oral Daily   atorvastatin   40 mg Oral QHS   cholecalciferol  1,000 Units Oral BID   docusate sodium  100 mg Oral Daily   ezetimibe  10 mg Oral Daily   feeding supplement (ENSURE ENLIVE)  237 mL Oral BID BM   folic acid  1 mg Oral QHS   guaiFENesin  1,200 mg Oral BID   magnesium citrate  0.5 Bottle Oral Once   multivitamin with minerals  1 tablet Oral Daily   pantoprazole  80 mg Oral Daily   polyethylene glycol  17 g Oral Daily   Ensure Max Protein  11 oz Oral Daily   psyllium  1 packet Oral Daily   vitamin C  1,000 mg Oral BID   Continuous Infusions: PRN Meds:.acetaminophen **OR** acetaminophen, albuterol, alum & mag hydroxide-simeth, alum hydroxide-mag trisilicate, bisacodyl, calcium carbonate, guaiFENesin-dextromethorphan, hydrALAZINE, morphine injection, ondansetron, sodium chloride  DVT prophylaxis: Eliquis  Code Status: Full code Family Communication: d/w wife over the phone Disposition Plan: home when stable.   Consultants:   PCCM  Procedures:   None   Antimicrobials:  None    Objective: Vitals:   11/16/18 0452 11/16/18 0600 11/16/18 0819 11/16/18 1141  BP: 110/69  120/70   Pulse:   72 76  Resp:   (!) 28 (!) 24  Temp: 98.6 F (37 C)  (!) 97.4 F (36.3 C)   TempSrc: Oral  Oral   SpO2:  99% 93% 100%  Weight:      Height:        Intake/Output Summary (Last 24 hours) at 11/16/2018 1141 Last data filed at 11/15/2018 1400 Gross per 24 hour  Intake --  Output 501 ml  Net -501 ml   Filed Weights   10/29/18 0422  Weight: 92.2 kg    Examination:  Constitutional: Seems to be breathing quite hard Eyes: No icterus seen ENMT: Moist mucous membranes Respiratory: Bibasilar rhonchi, no wheezing or crackles Cardiovascular: Regular rate and rhythm, no murmurs.  No edema Abdomen: Soft, nontender, nondistended, positive bowel sounds Musculoskeletal: no clubbing / cyanosis.  Skin: No rashes Neurologic: No focal deficits, equal strength Psychiatric: Normal judgment and  insight. Alert and oriented x 3. Normal mood.    Data Reviewed: I have independently reviewed following labs and imaging studies   CBC: Recent Labs  Lab 11/11/18 0039 11/12/18 0528 11/13/18 0608 11/14/18 0400 11/15/18 0150  WBC 10.7* 8.9 7.3 7.4 6.0  HGB 13.9 13.3 13.7 12.9* 12.0*  HCT 42.6 41.1 42.7 40.9 37.0*  MCV 93.4 94.3 96.0 96.0 96.6  PLT 105* 109* 101* 96* 84*   Basic Metabolic Panel: Recent Labs  Lab 11/11/18 0039 11/12/18 0528 11/13/18 0608 11/14/18 0400 11/15/18 0150  NA 139 141 141 142 141  K 4.1 4.1 4.3 4.9 4.1  CL 106 108 106 107 107  CO2 24 27 26 27 27   GLUCOSE 117* 108* 94 99 85  BUN 32* 37* 34* 35* 32*  CREATININE 1.05 1.27* 1.22 1.19 1.17  CALCIUM 8.0* 8.2* 8.1* 8.3* 8.1*  MG  --  2.2 2.3 2.2  --    GFR: Estimated Creatinine Clearance: 62.3 mL/min (by C-G formula based on SCr of 1.17 mg/dL). Liver Function Tests: Recent Labs  Lab 11/11/18 0039 11/12/18 0528 11/13/18 0608 11/14/18 0400 11/15/18 0150  AST 29 31 33 29 26  ALT 46* 43 45* 39 33  ALKPHOS 73 68 66 66 63  BILITOT 1.2 1.0 1.0  1.0 0.9  PROT 5.5* 5.2* 5.6* 5.3* 5.1*  ALBUMIN 2.7* 2.7* 3.0* 2.8* 2.8*   No results for input(s): LIPASE, AMYLASE in the last 168 hours. No results for input(s): AMMONIA in the last 168 hours. Coagulation Profile: No results for input(s): INR, PROTIME in the last 168 hours. Cardiac Enzymes: No results for input(s): CKTOTAL, CKMB, CKMBINDEX, TROPONINI in the last 168 hours. BNP (last 3 results) No results for input(s): PROBNP in the last 8760 hours. HbA1C: No results for input(s): HGBA1C in the last 72 hours. CBG: No results for input(s): GLUCAP in the last 168 hours. Lipid Profile: No results for input(s): CHOL, HDL, LDLCALC, TRIG, CHOLHDL, LDLDIRECT in the last 72 hours. Thyroid Function Tests: No results for input(s): TSH, T4TOTAL, FREET4, T3FREE, THYROIDAB in the last 72 hours. Anemia Panel: Recent Labs    11/14/18 0400 11/15/18 0150   FERRITIN 65 56   Urine analysis:    Component Value Date/Time   COLORURINE YELLOW 04/24/2018 [place] 04/24/2018 1042   LABSPEC 1.008 04/24/2018 1042   PHURINE 5.0 04/24/2018 1042   GLUCOSEU NEGATIVE 04/24/2018 1042   HGBUR MODERATE (A) 04/24/2018 1042   BILIRUBINUR NEGATIVE 04/24/2018 1042   [place] 04/24/2018 1042   PROTEINUR 30 (A) 04/24/2018 1042   NITRITE NEGATIVE 04/24/2018 1042   LEUKOCYTESUR NEGATIVE 04/24/2018 1042   Sepsis Labs: Invalid input(s): PROCALCITONIN, LACTICIDVEN  Recent Results (from the past 240 hour(s))  MRSA PCR Screening     Status: None   Collection Time: 11/08/18 10:31 AM   Specimen: Nasal Mucosa; Nasopharyngeal  Result Value Ref Range Status   MRSA by PCR NEGATIVE NEGATIVE Final    Comment:        The GeneXpert MRSA Assay (FDA approved for NASAL specimens only), is one component of a comprehensive MRSA colonization surveillance program. It is not intended to diagnose MRSA infection nor to guide or monitor treatment for MRSA infections. Performed at [facility], [place] [address] [place], [place] [address]      Radiology Studies: Dg Chest Port 1 View  Result Date: 11/15/2018 CLINICAL DATA:  Hypoxia EXAM: PORTABLE CHEST 1 VIEW COMPARISON:  11/12/2018 FINDINGS: Patchy peripheral airspace opacities are again noted, not significantly changed since prior study. Heart is borderline in size. No effusions. No acute bony abnormality. IMPRESSION: Stable patchy peripheral airspace opacities bilaterally compatible with pneumonia. Electronically Signed   By: Rolm Baptise M.D.   On: 11/15/2018 09:18    Marzetta Board, MD, PhD [facility]  Contact via  [email or web address]  [place] P: [phone] F: [phone]

## 2018-11-17 ENCOUNTER — Inpatient Hospital Stay (HOSPITAL_COMMUNITY): Payer: Non-veteran care

## 2018-11-17 DIAGNOSIS — R0602 Shortness of breath: Secondary | ICD-10-CM | POA: Diagnosis not present

## 2018-11-17 DIAGNOSIS — J982 Interstitial emphysema: Secondary | ICD-10-CM

## 2018-11-17 DIAGNOSIS — R0902 Hypoxemia: Secondary | ICD-10-CM

## 2018-11-17 MED ORDER — APIXABAN 5 MG PO TABS: 5.0000 mg | ORAL_TABLET | Freq: Two times a day (BID) | ORAL | 1 refills | Status: DC

## 2018-11-17 NOTE — Discharge Summary (Signed)
Physician Discharge Summary  [name]. HUD:[id number] DOB: [birth year] DOA: 10/28/2018  PCP: [name], MD  Admit date: 10/28/2018 Discharge date: 11/17/2018  Admitted From: home Disposition:  Home   Recommendations for Outpatient Follow-up:  1. Follow up with PCP in 1-2 weeks 2. Please obtain BMP/CBC in one week  Home Health: PT, RN Equipment/Devices: home O2  Discharge Condition: stable CODE STATUS: Full code Diet recommendation: regular   HPI: Per admitting MD, [name]. is a 78 y.o. male with medical history significant of AL amyloidosis with nephrotic syndrome, hypertension, moderate nonobstructive CAD by cath 2011, aortic stenosis, GERD, hyperlipidemia, carotid artery disease presenting to the hospital for evaluation of SOB.  Patient states he was diagnosed with COVID-19 at the [place] 2 weeks ago.  He has been having shortness of breath, cough, and low-grade fevers.  He experiences substernal sharp chest pain every time he takes a deep breath. Symptoms have been getting progressively worse.  Denies any nausea, vomiting, abdominal pain, or diarrhea.  No other complaints. ED Course: Afebrile and hemodynamically stable.  SPO2 90 to 91% on room air and placed on 2 L supplemental oxygen.  COVID-19 rapid test positive.  White count 12.8.  Lactic acid x2 normal.   High-sensitivity troponin negative.  EKG not suggestive of ACS.  D-dimer 1.06, LDH 365, fibrinogen 628, CRP 3.7. Procalcitonin, ferritin pending.  AST 77, ALT 60.  Alk phos and T bili normal.  Blood culture x2 pending.  Chest x-ray showing bilateral heterogenous opacities in a pattern consistent with COVID-19 pneumonia.  Hospital Course: Principal Problem Acute hypoxic respiratory failure due to COVID-19 for pneumonia ,in setting of ongoing 2020 COVID-19 pandemic -patient was admitted to the hospital with hypoxic respiratory failure due to COVID-19 pneumonia.  He has received treatment with Remdesivir 7/6-7/10,  steroids for 10 days up until 7/15, received Actemra on 7/11 and plasma on 7/10.  On 7/16, patient's hypoxia got worse, he was having ectopy on the monitor and chest imaging showed newly diagnosed pneumomediastinum.  He was transferred and observed in the ICU, remained stable, on subsequent chest x-ray this appears to be improving and he was transferred back to the floor.  Patient did request Plaquenil treatment, this was discussed extensively with him by the hospitalist including potential side effects/risks however per patient's wishes he also completed treatment with hydroxychloroquine while hospitalized.  He was able to be weaned off from high flow nasal cannula to regular nasal cannula, however remains hypoxic to 84% with ambulation on room air requiring supplemental oxygen to which he recovers to 90-95%  Active problems: Pneumomediastinum -Patient developed ectopy, increased work of breathing/dyspnea and chest pain after BM.  Briefly monitored in the ICU, overall remained stable with stable chest x-ray. Supportive care Acute pulmonary embolism -diagnosed on 7/11, subsegmental. Initially placed on heparin the transitioned to Eliquis.  History primary amyloidosis on NinlaroS/p autologous stemm cell transplant 2018 -Had been on maintenance Ninlaro (monoclonal Ab proteasome inhibitor, promotes clonal myeloma cell apoptosis, some immunomodulatory/immunesuppressant activity). Not on it since 2 weeks prior to this hospitalization.  -Continue with acyclovir prophylaxis -continue to hold Ninlaro, defer to outpatient oncology when and if to resume  Coronary disease secondary prevention -Continue aspirin, Lipitor, Zetia Hypertension -Hold Lasix, K dur, losartan on discharge as blood pressure has been stable off of this medications.  Advised ambulatory monitoring and PCP follow-up to see when and if this medication is need to be reintroduced. Hyperglycemia -Hyperglycemia resolved. CBGs stable Other  medications -  Continue PPI Chronic thrombocytopenia -Stable CKD III -Stable Transaminitis -Mild, secondary to COVID, stable,   Discharge Diagnoses:  Principal Problem:   Pneumonia due to COVID-19 virus Active Problems:   Essential hypertension   AL amyloidosis ([place])   Acute respiratory failure with hypoxia ([place])   Thrombocytopenia ([place])     Discharge Instructions   Allergies as of 11/17/2018      Reactions   Ramipril Cough      Medication List    STOP taking these medications   cefdinir 300 MG capsule Commonly known as: OMNICEF   dexamethasone 4 MG tablet Commonly known as: DECADRON   famotidine 20 MG tablet Commonly known as: PEPCID   furosemide 40 MG tablet Commonly known as: LASIX   ixazomib citrate 3 MG capsule Commonly known as: NINLARO   losartan 25 MG tablet Commonly known as: COZAAR   potassium chloride SA 20 MEQ tablet Commonly known as: K-DUR   prochlorperazine 10 MG tablet Commonly known as: COMPAZINE     TAKE these medications   acyclovir 400 MG tablet Commonly known as: ZOVIRAX Take 400 mg by mouth 2 (two) times daily.   albuterol 108 (90 Base) MCG/ACT inhaler Commonly known as: VENTOLIN HFA Inhale 2 puffs into the lungs every 4 (four) hours as needed for shortness of breath or wheezing.   apixaban 5 MG Tabs tablet Commonly known as: ELIQUIS Take 1 tablet (5 mg total) by mouth 2 (two) times daily.   aspirin EC 81 MG tablet Take 81 mg by mouth daily.   atorvastatin 40 MG tablet Commonly known as: LIPITOR Take 40 mg by mouth at bedtime.   cetirizine 10 MG tablet Commonly known as: ZYRTEC Take 10 mg by mouth daily. Takes as needed   D3-1000 25 MCG (1000 UT) tablet Generic drug: Cholecalciferol Take 1,000 Units by mouth 2 (two) times a day.   ezetimibe 10 MG tablet Commonly known as: ZETIA Take 10 mg by mouth daily.   folic acid 1 MG tablet Commonly known as: FOLVITE Take 1 mg by mouth at bedtime.   isosorbide  mononitrate 15 mg Tb24 24 hr tablet Commonly known as: IMDUR Take 15 mg by mouth daily.   Multi-Vitamins Tabs Take 1 tablet by mouth daily.   mupirocin ointment 2 % Commonly known as: BACTROBAN Apply 1 application topically as needed (rash).   nitroGLYCERIN 0.4 MG SL tablet Commonly known as: NITROSTAT Place 0.4 mg under the tongue every 5 (five) minutes as needed for chest pain.   ondansetron 8 MG tablet Commonly known as: ZOFRAN Take 8 mg by mouth 2 (two) times daily as needed for nausea or vomiting.   pantoprazole 40 MG tablet Commonly known as: PROTONIX Take 80 mg by mouth daily.   triamcinolone cream 0.1 % Commonly known as: KENALOG Apply 1 application topically as needed for rash.   vitamin C 1000 MG tablet Take 1,000 mg by mouth 2 (two) times a day.            Durable Medical Equipment  (From admission, onward)         Start     Ordered   11/16/18 1213  For home use only DME oxygen  Once    Question Answer Comment  Length of Need 6 Months   Mode or (Route) Nasal cannula   Liters per Minute 2   Frequency Continuous (stationary and portable oxygen unit needed)   Oxygen conserving device No   Oxygen delivery system Gas  11/16/18 1212         Follow-up Information    [name], MD. Schedule an appointment as soon as possible for a visit in 1 week(s).   Specialty: Internal Medicine Contact information: [place] [address] [phone]        Burnell Blanks, MD .   Specialty: Cardiology Contact information: [address] [place] [address] [phone]           Consultations:  PCCM  Procedures/Studies:  Dg Chest 2 View  Result Date: 10/28/2018 CLINICAL DATA:  Chest pain. Patient reports positive COVID-19 test last week and bilateral pneumonia. EXAM: CHEST - 2 VIEW COMPARISON:  04/22/2018. No recent exams available. FINDINGS: Unchanged heart size and mediastinal contours.  Bilateral heterogeneous opacities in the mid and lower lung zone peripherally predominant distribution. No pulmonary edema. No pleural effusion or pneumothorax. Degenerative change in the spine. Chronic wedging of vertebra at the thoracolumbar junction. IMPRESSION: Bilateral heterogeneous opacities in a pattern consistent with COVID-19 pneumonia. Electronically Signed   By: Keith Rake M.D.   On: 10/28/2018 23:32   Ct Chest Wo Contrast  Result Date: 11/08/2018 CLINICAL DATA:  Pneumonia EXAM: CT CHEST WITHOUT CONTRAST TECHNIQUE: Multidetector CT imaging of the chest was performed following the standard protocol without IV contrast. COMPARISON:  CT chest, 11/03/2018 FINDINGS: Examination is limited by breath motion artifact throughout, particularly at the lung bases. Cardiovascular: Aortic atherosclerosis. Aortic valve calcifications. Normal heart size. Three-vessel coronary artery calcifications. No pericardial effusion. Mediastinum/Nodes: There is new pneumomediastinum. No enlarged mediastinal, hilar, or axillary lymph nodes. Thyroid gland, trachea, and esophagus demonstrate no significant findings. Lungs/Pleura: Evaluation of the lungs is significantly limited by breath motion artifact, however within this limitation, there is extensive bilateral, predominantly subpleural ground-glass and consolidative pulmonary opacity, significantly increased and more consolidated compared to examination dated 11/03/2018. No pleural effusion or pneumothorax. Upper Abdomen: No acute abnormality. Musculoskeletal: No chest wall mass or suspicious bone lesions identified. IMPRESSION: 1. Evaluation of the lungs is significantly limited by breath motion artifact, however within this limitation, there is extensive bilateral, predominantly subpleural ground-glass and consolidative pulmonary opacity, significantly increased and more consolidated compared to examination dated 11/03/2018. Findings remain consistent with COVID-19  pneumonia. 2. New pneumomediastinum. There is no obvious tracheal or bronchial defect noted. 3.  Coronary artery disease and aortic atherosclerosis. Electronically Signed   By: Eddie Candle M.D.   On: 11/08/2018 10:27   Ct Angio Chest Pe W Or Wo Contrast  Result Date: 11/03/2018 CLINICAL DATA:  PE suspected EXAM: CT ANGIOGRAPHY CHEST WITH CONTRAST TECHNIQUE: Multidetector CT imaging of the chest was performed using the standard protocol during bolus administration of intravenous contrast. Multiplanar CT image reconstructions and MIPs were obtained to evaluate the vascular anatomy. CONTRAST:  115m OMNIPAQUE IOHEXOL 350 MG/ML SOLN COMPARISON:  10/29/2018 FINDINGS: Cardiovascular: Satisfactory opacification of the pulmonary arteries to the segmental level. Positive examination for pulmonary embolism with subsegmental embolus noted in the medial segment of the right lower lobe (series 4, image 110). The RV to LV ratio is elevated at approximately 1.3. Three-vessel coronary artery calcifications. No pericardial effusion. Aortic atherosclerosis. Mediastinum/Nodes: No enlarged mediastinal, hilar, or axillary lymph nodes. Thyroid gland, trachea, and esophagus demonstrate no significant findings. Lungs/Pleura: Diffuse bilateral ground-glass pulmonary opacity, which is more diffuse, although less consolidative and conspicuous in comparison to prior examination. No pleural effusion or pneumothorax. Upper Abdomen: No acute abnormality. Musculoskeletal: No chest wall abnormality. No acute or significant osseous findings. Review  of the MIP images confirms the above findings. IMPRESSION: 1. Positive examination for pulmonary embolism with subsegmental embolus noted in the medial segment of the right lower lobe (series 4, image 110). 2. The RV to LV ratio is elevated at approximately 1.3, although given isolated subsegmental embolus this is not likely secondary to embolus induced pulmonary hypertension. 3.  Coronary artery  disease. 4. Diffuse bilateral ground-glass pulmonary opacity, which is more diffuse, although less consolidative and conspicuous in comparison to prior examination. Findings remain consistent with evolution of COVID-19. These results were called by telephone at the time of interpretation on 11/03/2018 at 1:28 pm to Dr. Marzetta Board , who verbally acknowledged these results. Electronically Signed   By: Eddie Candle M.D.   On: 11/03/2018 13:31   Ct Angio Chest Pe W Or Wo Contrast  Result Date: 10/29/2018 CLINICAL DATA:  Chest pain.  COVID-19. EXAM: CT ANGIOGRAPHY CHEST WITH CONTRAST TECHNIQUE: Multidetector CT imaging of the chest was performed using the standard protocol during bolus administration of intravenous contrast. Multiplanar CT image reconstructions and MIPs were obtained to evaluate the vascular anatomy. CONTRAST:  Sixty-four OMNIPAQUE IOHEXOL 350 MG/ML SOLN COMPARISON:  Chest x-ray 10/28/2018. CT angiogram of the chest dated 11/16/2016 FINDINGS: Cardiovascular: Aortic atherosclerosis. Coronary artery calcifications. Heart size is at the upper limits of normal. No pericardial effusion. Mediastinum/Nodes: No adenopathy. Thyroid gland and trachea are normal. Small hiatal hernia. Lungs/Pleura: Multiple bilateral patchy peripheral pulmonary infiltrates consistent with COVID-19 pneumonia. No effusions. Stable 4 x 7 mm nodule in right middle lobe since 2018. Upper Abdomen: Negative. Musculoskeletal: No chest wall abnormality. No acute or significant osseous findings. Review of the MIP images confirms the above findings. IMPRESSION: 1. No pulmonary emboli. 2. Multiple bilateral hazy pulmonary infiltrates consistent with COVID-19 pneumonia. 3.  Aortic Atherosclerosis (ICD10-I70.0). 4. Coronary artery calcifications. Electronically Signed   By: Lorriane Shire M.D.   On: 10/29/2018 04:19   Dg Chest Port 1 View  Result Date: 11/17/2018 CLINICAL DATA:  Shortness of breath and hypoxia EXAM: PORTABLE CHEST 1  VIEW COMPARISON:  11/15/2018 and prior radiographs FINDINGS: Cardiomediastinal silhouette is unchanged. Patchy bilateral airspace opacities are again noted and may be slightly decreased from the prior study. No pneumothorax or other significant change. IMPRESSION: Slightly decreased patchy airspace opacities without other significant change. Electronically Signed   By: Margarette Canada M.D.   On: 11/17/2018 08:09   Dg Chest Port 1 View  Result Date: 11/15/2018 CLINICAL DATA:  Hypoxia EXAM: PORTABLE CHEST 1 VIEW COMPARISON:  11/12/2018 FINDINGS: Patchy peripheral airspace opacities are again noted, not significantly changed since prior study. Heart is borderline in size. No effusions. No acute bony abnormality. IMPRESSION: Stable patchy peripheral airspace opacities bilaterally compatible with pneumonia. Electronically Signed   By: Rolm Baptise M.D.   On: 11/15/2018 09:18   Dg Chest Port 1 View  Result Date: 11/12/2018 CLINICAL DATA:  Respiratory failure. EXAM: PORTABLE CHEST 1 VIEW COMPARISON:  11/10/2018. FINDINGS: Stable cardiomegaly. Bilateral pulmonary infiltrates particularly prominent in the peripheries of both lungs. No interim change. No pleural effusion or pneumothorax. Scratch degenerative changes and scoliosis thoracic spine. IMPRESSION: Persistent bilateral pulmonary infiltrates, no change from prior exam. Electronically Signed   By: Elim   On: 11/12/2018 06:18   Dg Chest Port 1 View  Result Date: 11/10/2018 CLINICAL DATA:  Follow-up viral pneumonia in pneumomediastinum. EXAM: PORTABLE CHEST 1 VIEW COMPARISON:  11/08/2018 CT.  11/03/2018 radiography. FINDINGS: Mild cardiomegaly. Chronic aortic atherosclerosis. Bilateral patchy pulmonary infiltrates in the  mid and lower lungs, worsened since the previous chest radiograph. Pneumomediastinum remains visible. No evidence of pneumothorax. No pleural effusion. No significant bone finding. IMPRESSION: Bilateral mid and lower lung pulmonary  infiltrates consistent with viral pneumonia, worsened since the radiograph 1 week ago. Pneumomediastinum. No pneumothorax. Electronically Signed   By: [name] Chimes M.D.   On: 11/10/2018 09:52   Dg Chest Port 1 View  Result Date: 11/03/2018 CLINICAL DATA:  Dyspnea. EXAM: PORTABLE CHEST 1 VIEW COMPARISON:  Chest CT, 10/28/2018.  10/29/2018.  Chest radiographs FINDINGS: Mild increase in hazy airspace opacity in the right mid lung. Other areas of hazy airspace opacity in the mid and lower lungs bilaterally are unchanged. No convincing pleural effusion.  No pneumothorax. IMPRESSION: 1. Slight worsening in lung aeration. Mild increase in hazy airspace opacity in the right mid lung. Other previously seen areas of hazy lung opacity bilaterally are otherwise stable. Electronically Signed   By: Lajean Manes M.D.   On: 11/03/2018 10:13     Subjective: - no chest pain, shortness of breath, no abdominal pain, nausea or vomiting.   Discharge Exam: BP 140/78    Pulse 73    Temp 98.1 F (36.7 C) (Oral)    Resp (!) 22    Ht _0  (1.778 m)    Wt 92.2 kg    SpO2 97%    BMI 29.17 kg/m   General: Pt is alert, awake, not in acute distress Cardiovascular: RRR, S1/S2 +, no rubs, no gallops Respiratory: CTA bilaterally, no wheezing, no rhonchi Abdominal: Soft, NT, ND, bowel sounds + Extremities: no edema, no cyanosis    The results of significant diagnostics from this hospitalization (including imaging, microbiology, ancillary and laboratory) are listed below for reference.     Microbiology: Recent Results (from the past 240 hour(s))  MRSA PCR Screening     Status: None   Collection Time: 11/08/18 10:31 AM   Specimen: Nasal Mucosa; Nasopharyngeal  Result Value Ref Range Status   MRSA by PCR NEGATIVE NEGATIVE Final    Comment:        The GeneXpert MRSA Assay (FDA approved for NASAL specimens only), is one component of a comprehensive MRSA colonization surveillance program. It is not intended to  diagnose MRSA infection nor to guide or monitor treatment for MRSA infections. Performed at [facility], [address], [place], [place] [address]      Labs: BNP (last 3 results) Recent Labs    04/22/18 0300  BNP 80.9   Basic Metabolic Panel: Recent Labs  Lab 11/11/18 0039 11/12/18 0528 11/13/18 0608 11/14/18 0400 11/15/18 0150  NA 139 141 141 142 141  K 4.1 4.1 4.3 4.9 4.1  CL 106 108 106 107 107  CO2 _1 GLUCOSE 117* 108* 94 99 85  BUN 32* 37* 34* 35* 32*  CREATININE 1.05 1.27* 1.22 1.19 1.17  CALCIUM 8.0* 8.2* 8.1* 8.3* 8.1*  MG  --  2.2 2.3 2.2  --    Liver Function Tests: Recent Labs  Lab 11/11/18 0039 11/12/18 0528 11/13/18 0608 11/14/18 0400 11/15/18 0150  AST 29 31 33 29 26  ALT 46* 43 45* 39 33  ALKPHOS 73 68 66 66 63  BILITOT 1.2 1.0 1.0 1.0 0.9  PROT 5.5* 5.2* 5.6* 5.3* 5.1*  ALBUMIN 2.7* 2.7* 3.0* 2.8* 2.8*   No results for input(s): LIPASE, AMYLASE in the last 168 hours. No results for input(s): AMMONIA in the last 168 hours. CBC:  Recent Labs  Lab 11/11/18 0039 11/12/18 0528 11/13/18 0608 11/14/18 0400 11/15/18 0150  WBC 10.7* 8.9 7.3 7.4 6.0  HGB 13.9 13.3 13.7 12.9* 12.0*  HCT 42.6 41.1 42.7 40.9 37.0*  MCV 93.4 94.3 96.0 96.0 96.6  PLT 105* 109* 101* 96* 84*   Cardiac Enzymes: No results for input(s): CKTOTAL, CKMB, CKMBINDEX, TROPONINI in the last 168 hours. BNP: Invalid input(s): POCBNP CBG: No results for input(s): GLUCAP in the last 168 hours. D-Dimer Recent Labs    11/15/18 0150  DDIMER 1.27*   Hgb A1c No results for input(s): HGBA1C in the last 72 hours. Lipid Profile No results for input(s): CHOL, HDL, LDLCALC, TRIG, CHOLHDL, LDLDIRECT in the last 72 hours. Thyroid function studies No results for input(s): TSH, T4TOTAL, T3FREE, THYROIDAB in the last 72 hours.  Invalid input(s): FREET3 Anemia work up Recent Labs    11/15/18 0150  FERRITIN 56   Urinalysis    Component  Value Date/Time   COLORURINE YELLOW 04/24/2018 [place] 04/24/2018 1042   LABSPEC 1.008 04/24/2018 1042   PHURINE 5.0 04/24/2018 1042   GLUCOSEU NEGATIVE 04/24/2018 1042   HGBUR MODERATE (A) 04/24/2018 1042   BILIRUBINUR NEGATIVE 04/24/2018 1042   [place] 04/24/2018 1042   PROTEINUR 30 (A) 04/24/2018 1042   NITRITE NEGATIVE 04/24/2018 1042   LEUKOCYTESUR NEGATIVE 04/24/2018 1042   Sepsis Labs Invalid input(s): PROCALCITONIN,  WBC,  LACTICIDVEN  FURTHER DISCHARGE INSTRUCTIONS:   Get Medicines reviewed and adjusted: Please take all your medications with you for your next visit with your Primary MD   Laboratory/radiological data: Please request your Primary MD to go over all hospital tests and procedure/radiological results at the follow up, please ask your Primary MD to get all Hospital records sent to his/her office.   In some cases, they will be blood work, cultures and biopsy results pending at the time of your discharge. Please request that your primary care M.D. goes through all the records of your hospital data and follows up on these results.   Also Note the following: If you experience worsening of your admission symptoms, develop shortness of breath, life threatening emergency, suicidal or homicidal thoughts you must seek medical attention immediately by calling 911 or calling your MD immediately  if symptoms less severe.   You must read complete instructions/literature along with all the possible adverse reactions/side effects for all the Medicines you take and that have been prescribed to you. Take any new Medicines after you have completely understood and accpet all the possible adverse reactions/side effects.    Do not drive when taking Pain medications or sleeping medications (Benzodaizepines)   Do not take more than prescribed Pain, Sleep and Anxiety Medications. It is not advisable to combine anxiety,sleep and pain medications without talking  with your primary care practitioner   Special Instructions: If you have smoked or chewed Tobacco  in the last 2 yrs please stop smoking, stop any regular Alcohol  and or any Recreational drug use.   Wear Seat belts while driving.   Please note: You were cared for by a hospitalist during your hospital stay. Once you are discharged, your primary care physician will handle any further medical issues. Please note that NO REFILLS for any discharge medications will be authorized once you are discharged, as it is imperative that you return to your primary care physician (or establish a relationship with a primary care physician if you do not have one) for your post hospital discharge needs  so that they can reassess your need for medications and monitor your lab values.  Time coordinating discharge: 45 minutes  SIGNED:  Marzetta Board, MD, PhD 11/17/2018, 10:15 AM

## 2018-11-17 NOTE — Progress Notes (Signed)
The pt was provided with d/c instructions and prescriptions. After discussing the pt's plan of care upon d/c home, the pt reported no further questions or concerns.

## 2018-11-20 ENCOUNTER — Telehealth: Payer: Self-pay

## 2018-11-20 NOTE — Telephone Encounter (Signed)
Oral Oncology Patient Advocate Encounter  I received a message stating that the patient had a question about his Ninlaro patient assistance.  The patient is getting his Ninlaro filled at [facility] and has a $0 copay. Copay assistance is not needed.  I called the patient and spoke to his wife about this, she stated that the patient was saying that the same grant that was used for his Ninlaro in the past should also cover doctor bills. The LLS grant that the patient previously had has expired.  I gave the patients wife, Mardene Celeste this information and also gave her Corbin Ade contact information for assistance with medical bills.  Mardene Celeste verbalized understanding and great appreciation.  Sylvarena Patient [place] Phone [phone] Fax [phone] 11/20/2018   1:29 PM

## 2018-11-21 DIAGNOSIS — I2699 Other pulmonary embolism without acute cor pulmonale: Secondary | ICD-10-CM | POA: Diagnosis not present

## 2018-11-21 DIAGNOSIS — I2510 Atherosclerotic heart disease of native coronary artery without angina pectoris: Secondary | ICD-10-CM | POA: Diagnosis not present

## 2018-11-21 DIAGNOSIS — N183 Chronic kidney disease, stage 3 (moderate): Secondary | ICD-10-CM | POA: Diagnosis not present

## 2018-11-21 DIAGNOSIS — E8581 Light chain (AL) amyloidosis: Secondary | ICD-10-CM | POA: Diagnosis not present

## 2018-11-21 DIAGNOSIS — I129 Hypertensive chronic kidney disease with stage 1 through stage 4 chronic kidney disease, or unspecified chronic kidney disease: Secondary | ICD-10-CM | POA: Diagnosis not present

## 2018-11-21 DIAGNOSIS — U071 COVID-19: Secondary | ICD-10-CM | POA: Diagnosis not present

## 2018-11-23 DIAGNOSIS — I129 Hypertensive chronic kidney disease with stage 1 through stage 4 chronic kidney disease, or unspecified chronic kidney disease: Secondary | ICD-10-CM | POA: Diagnosis not present

## 2018-11-23 DIAGNOSIS — J1289 Other viral pneumonia: Secondary | ICD-10-CM | POA: Diagnosis not present

## 2018-11-23 DIAGNOSIS — I2699 Other pulmonary embolism without acute cor pulmonale: Secondary | ICD-10-CM | POA: Diagnosis not present

## 2018-11-23 DIAGNOSIS — U071 COVID-19: Secondary | ICD-10-CM | POA: Diagnosis not present

## 2018-11-23 DIAGNOSIS — K5730 Diverticulosis of large intestine without perforation or abscess without bleeding: Secondary | ICD-10-CM | POA: Diagnosis not present

## 2018-11-23 DIAGNOSIS — N183 Chronic kidney disease, stage 3 (moderate): Secondary | ICD-10-CM | POA: Diagnosis not present

## 2018-11-23 DIAGNOSIS — I2510 Atherosclerotic heart disease of native coronary artery without angina pectoris: Secondary | ICD-10-CM | POA: Diagnosis not present

## 2018-11-23 DIAGNOSIS — E8581 Light chain (AL) amyloidosis: Secondary | ICD-10-CM | POA: Diagnosis not present

## 2018-11-23 DIAGNOSIS — D696 Thrombocytopenia, unspecified: Secondary | ICD-10-CM | POA: Diagnosis not present

## 2018-11-23 DIAGNOSIS — G40909 Epilepsy, unspecified, not intractable, without status epilepticus: Secondary | ICD-10-CM | POA: Diagnosis not present

## 2018-11-24 DIAGNOSIS — N183 Chronic kidney disease, stage 3 (moderate): Secondary | ICD-10-CM | POA: Diagnosis not present

## 2018-11-24 DIAGNOSIS — I129 Hypertensive chronic kidney disease with stage 1 through stage 4 chronic kidney disease, or unspecified chronic kidney disease: Secondary | ICD-10-CM | POA: Diagnosis not present

## 2018-11-24 DIAGNOSIS — J1289 Other viral pneumonia: Secondary | ICD-10-CM | POA: Diagnosis not present

## 2018-11-24 DIAGNOSIS — G40909 Epilepsy, unspecified, not intractable, without status epilepticus: Secondary | ICD-10-CM | POA: Diagnosis not present

## 2018-11-24 DIAGNOSIS — D696 Thrombocytopenia, unspecified: Secondary | ICD-10-CM | POA: Diagnosis not present

## 2018-11-24 DIAGNOSIS — I2510 Atherosclerotic heart disease of native coronary artery without angina pectoris: Secondary | ICD-10-CM | POA: Diagnosis not present

## 2018-11-24 DIAGNOSIS — I2699 Other pulmonary embolism without acute cor pulmonale: Secondary | ICD-10-CM | POA: Diagnosis not present

## 2018-11-24 DIAGNOSIS — E8581 Light chain (AL) amyloidosis: Secondary | ICD-10-CM | POA: Diagnosis not present

## 2018-11-24 DIAGNOSIS — U071 COVID-19: Secondary | ICD-10-CM | POA: Diagnosis not present

## 2018-11-24 DIAGNOSIS — K5730 Diverticulosis of large intestine without perforation or abscess without bleeding: Secondary | ICD-10-CM | POA: Diagnosis not present

## 2018-11-26 DIAGNOSIS — N183 Chronic kidney disease, stage 3 (moderate): Secondary | ICD-10-CM | POA: Diagnosis not present

## 2018-11-27 DIAGNOSIS — N183 Chronic kidney disease, stage 3 (moderate): Secondary | ICD-10-CM | POA: Diagnosis not present

## 2018-11-27 DIAGNOSIS — I2510 Atherosclerotic heart disease of native coronary artery without angina pectoris: Secondary | ICD-10-CM | POA: Diagnosis not present

## 2018-11-27 DIAGNOSIS — I129 Hypertensive chronic kidney disease with stage 1 through stage 4 chronic kidney disease, or unspecified chronic kidney disease: Secondary | ICD-10-CM | POA: Diagnosis not present

## 2018-11-27 DIAGNOSIS — D696 Thrombocytopenia, unspecified: Secondary | ICD-10-CM | POA: Diagnosis not present

## 2018-11-27 DIAGNOSIS — I2699 Other pulmonary embolism without acute cor pulmonale: Secondary | ICD-10-CM | POA: Diagnosis not present

## 2018-11-27 DIAGNOSIS — G40909 Epilepsy, unspecified, not intractable, without status epilepticus: Secondary | ICD-10-CM | POA: Diagnosis not present

## 2018-11-27 DIAGNOSIS — E8581 Light chain (AL) amyloidosis: Secondary | ICD-10-CM | POA: Diagnosis not present

## 2018-11-27 DIAGNOSIS — J1289 Other viral pneumonia: Secondary | ICD-10-CM | POA: Diagnosis not present

## 2018-11-27 DIAGNOSIS — U071 COVID-19: Secondary | ICD-10-CM | POA: Diagnosis not present

## 2018-11-27 DIAGNOSIS — K5730 Diverticulosis of large intestine without perforation or abscess without bleeding: Secondary | ICD-10-CM | POA: Diagnosis not present

## 2018-11-28 DIAGNOSIS — I2699 Other pulmonary embolism without acute cor pulmonale: Secondary | ICD-10-CM | POA: Diagnosis not present

## 2018-11-28 DIAGNOSIS — D696 Thrombocytopenia, unspecified: Secondary | ICD-10-CM | POA: Diagnosis not present

## 2018-11-28 DIAGNOSIS — E8581 Light chain (AL) amyloidosis: Secondary | ICD-10-CM | POA: Diagnosis not present

## 2018-11-28 DIAGNOSIS — G40909 Epilepsy, unspecified, not intractable, without status epilepticus: Secondary | ICD-10-CM | POA: Diagnosis not present

## 2018-11-28 DIAGNOSIS — U071 COVID-19: Secondary | ICD-10-CM | POA: Diagnosis not present

## 2018-11-28 DIAGNOSIS — I2510 Atherosclerotic heart disease of native coronary artery without angina pectoris: Secondary | ICD-10-CM | POA: Diagnosis not present

## 2018-11-28 DIAGNOSIS — I129 Hypertensive chronic kidney disease with stage 1 through stage 4 chronic kidney disease, or unspecified chronic kidney disease: Secondary | ICD-10-CM | POA: Diagnosis not present

## 2018-11-28 DIAGNOSIS — K5730 Diverticulosis of large intestine without perforation or abscess without bleeding: Secondary | ICD-10-CM | POA: Diagnosis not present

## 2018-11-28 DIAGNOSIS — N183 Chronic kidney disease, stage 3 (moderate): Secondary | ICD-10-CM | POA: Diagnosis not present

## 2018-11-28 DIAGNOSIS — J1289 Other viral pneumonia: Secondary | ICD-10-CM | POA: Diagnosis not present

## 2018-11-29 DIAGNOSIS — I2699 Other pulmonary embolism without acute cor pulmonale: Secondary | ICD-10-CM | POA: Diagnosis not present

## 2018-11-29 DIAGNOSIS — I129 Hypertensive chronic kidney disease with stage 1 through stage 4 chronic kidney disease, or unspecified chronic kidney disease: Secondary | ICD-10-CM | POA: Diagnosis not present

## 2018-11-29 DIAGNOSIS — G40909 Epilepsy, unspecified, not intractable, without status epilepticus: Secondary | ICD-10-CM | POA: Diagnosis not present

## 2018-11-29 DIAGNOSIS — E8581 Light chain (AL) amyloidosis: Secondary | ICD-10-CM | POA: Diagnosis not present

## 2018-11-29 DIAGNOSIS — U071 COVID-19: Secondary | ICD-10-CM | POA: Diagnosis not present

## 2018-11-29 DIAGNOSIS — N183 Chronic kidney disease, stage 3 (moderate): Secondary | ICD-10-CM | POA: Diagnosis not present

## 2018-11-29 DIAGNOSIS — D696 Thrombocytopenia, unspecified: Secondary | ICD-10-CM | POA: Diagnosis not present

## 2018-11-29 DIAGNOSIS — J1289 Other viral pneumonia: Secondary | ICD-10-CM | POA: Diagnosis not present

## 2018-11-29 DIAGNOSIS — K5730 Diverticulosis of large intestine without perforation or abscess without bleeding: Secondary | ICD-10-CM | POA: Diagnosis not present

## 2018-11-29 DIAGNOSIS — I2510 Atherosclerotic heart disease of native coronary artery without angina pectoris: Secondary | ICD-10-CM | POA: Diagnosis not present

## 2018-11-30 ENCOUNTER — Telehealth: Payer: Self-pay | Admitting: *Deleted

## 2018-11-30 DIAGNOSIS — K5730 Diverticulosis of large intestine without perforation or abscess without bleeding: Secondary | ICD-10-CM | POA: Diagnosis not present

## 2018-11-30 DIAGNOSIS — G40909 Epilepsy, unspecified, not intractable, without status epilepticus: Secondary | ICD-10-CM | POA: Diagnosis not present

## 2018-11-30 DIAGNOSIS — I2699 Other pulmonary embolism without acute cor pulmonale: Secondary | ICD-10-CM | POA: Diagnosis not present

## 2018-11-30 DIAGNOSIS — I2510 Atherosclerotic heart disease of native coronary artery without angina pectoris: Secondary | ICD-10-CM | POA: Diagnosis not present

## 2018-11-30 DIAGNOSIS — I129 Hypertensive chronic kidney disease with stage 1 through stage 4 chronic kidney disease, or unspecified chronic kidney disease: Secondary | ICD-10-CM | POA: Diagnosis not present

## 2018-11-30 DIAGNOSIS — J1289 Other viral pneumonia: Secondary | ICD-10-CM | POA: Diagnosis not present

## 2018-11-30 DIAGNOSIS — U071 COVID-19: Secondary | ICD-10-CM | POA: Diagnosis not present

## 2018-11-30 DIAGNOSIS — E8581 Light chain (AL) amyloidosis: Secondary | ICD-10-CM | POA: Diagnosis not present

## 2018-11-30 DIAGNOSIS — N183 Chronic kidney disease, stage 3 (moderate): Secondary | ICD-10-CM | POA: Diagnosis not present

## 2018-11-30 DIAGNOSIS — D696 Thrombocytopenia, unspecified: Secondary | ICD-10-CM | POA: Diagnosis not present

## 2018-11-30 NOTE — Telephone Encounter (Signed)
Contacted patient wife. Per Dr. [name]: cancel appts for Monday and r/s for home labs and phone in 4 weeks. Advised Mrs. Esqueda that Monday appt will be cancelled and schedule message will be sent for lab/MD visit at [facility] in 4 weeks to allow patient to have appts scheduled.  In 4 weeks, if patient wants/is able to come to [facility] to see MD, he can, but if not able to, [facility] will work with [facility] to draw labs and change to phone visit. Mrs. Tester will call office to let us know whether patient is able to come in. Patient directed per Dr. [name] to continue holding Ninlaro. Patient/wife verbalized understanding.

## 2018-11-30 NOTE — Telephone Encounter (Signed)
Ms. Ernst Breach called. Patient was discharged 2 weeks ago from [facility]. Now home with [place] and PT/OT. Using O2 at 2L when at rest, at 4L with ambulation. Slowly regaining strength. Ms. Ernst Breach wants to know if Monday's visit could be a phone visit if the home health nurse draws his labs. Spoke with Colletta Maryland, RN from [facility]. She states she can draw labs at next home visit on Tuesday.  Requested labs should be faxed to [facility] office [phone]. Advised Ms. Buckles that Dr.Kale will be asked Monday morning (out of office today) about drawing labs on Tuesday at home and changing the appt to phone in a few days.  If plan approved, will fax labs to [facility] and contact Ms. Jacobs Monday morning prior to appt at 1:45.

## 2018-12-03 ENCOUNTER — Inpatient Hospital Stay: Payer: Medicare HMO

## 2018-12-03 ENCOUNTER — Inpatient Hospital Stay: Payer: Medicare HMO | Admitting: Hematology

## 2018-12-03 ENCOUNTER — Telehealth: Payer: Self-pay | Admitting: Hematology

## 2018-12-03 DIAGNOSIS — U071 COVID-19: Secondary | ICD-10-CM | POA: Diagnosis not present

## 2018-12-03 DIAGNOSIS — I2510 Atherosclerotic heart disease of native coronary artery without angina pectoris: Secondary | ICD-10-CM | POA: Diagnosis not present

## 2018-12-03 DIAGNOSIS — I2699 Other pulmonary embolism without acute cor pulmonale: Secondary | ICD-10-CM | POA: Diagnosis not present

## 2018-12-03 DIAGNOSIS — N183 Chronic kidney disease, stage 3 (moderate): Secondary | ICD-10-CM | POA: Diagnosis not present

## 2018-12-03 DIAGNOSIS — E8581 Light chain (AL) amyloidosis: Secondary | ICD-10-CM | POA: Diagnosis not present

## 2018-12-03 DIAGNOSIS — D696 Thrombocytopenia, unspecified: Secondary | ICD-10-CM | POA: Diagnosis not present

## 2018-12-03 DIAGNOSIS — I129 Hypertensive chronic kidney disease with stage 1 through stage 4 chronic kidney disease, or unspecified chronic kidney disease: Secondary | ICD-10-CM | POA: Diagnosis not present

## 2018-12-03 DIAGNOSIS — K5730 Diverticulosis of large intestine without perforation or abscess without bleeding: Secondary | ICD-10-CM | POA: Diagnosis not present

## 2018-12-03 DIAGNOSIS — G40909 Epilepsy, unspecified, not intractable, without status epilepticus: Secondary | ICD-10-CM | POA: Diagnosis not present

## 2018-12-03 DIAGNOSIS — J1289 Other viral pneumonia: Secondary | ICD-10-CM | POA: Diagnosis not present

## 2018-12-03 NOTE — Telephone Encounter (Signed)
Called pt per 8/07 sch message - unable to reach pt . Left message for patient with appt date and time

## 2018-12-05 DIAGNOSIS — J1289 Other viral pneumonia: Secondary | ICD-10-CM | POA: Diagnosis not present

## 2018-12-05 DIAGNOSIS — I2510 Atherosclerotic heart disease of native coronary artery without angina pectoris: Secondary | ICD-10-CM | POA: Diagnosis not present

## 2018-12-05 DIAGNOSIS — I2699 Other pulmonary embolism without acute cor pulmonale: Secondary | ICD-10-CM | POA: Diagnosis not present

## 2018-12-05 DIAGNOSIS — K5730 Diverticulosis of large intestine without perforation or abscess without bleeding: Secondary | ICD-10-CM | POA: Diagnosis not present

## 2018-12-05 DIAGNOSIS — I129 Hypertensive chronic kidney disease with stage 1 through stage 4 chronic kidney disease, or unspecified chronic kidney disease: Secondary | ICD-10-CM | POA: Diagnosis not present

## 2018-12-05 DIAGNOSIS — U071 COVID-19: Secondary | ICD-10-CM | POA: Diagnosis not present

## 2018-12-05 DIAGNOSIS — G40909 Epilepsy, unspecified, not intractable, without status epilepticus: Secondary | ICD-10-CM | POA: Diagnosis not present

## 2018-12-05 DIAGNOSIS — E8581 Light chain (AL) amyloidosis: Secondary | ICD-10-CM | POA: Diagnosis not present

## 2018-12-05 DIAGNOSIS — N183 Chronic kidney disease, stage 3 (moderate): Secondary | ICD-10-CM | POA: Diagnosis not present

## 2018-12-05 DIAGNOSIS — D696 Thrombocytopenia, unspecified: Secondary | ICD-10-CM | POA: Diagnosis not present

## 2018-12-06 DIAGNOSIS — I2510 Atherosclerotic heart disease of native coronary artery without angina pectoris: Secondary | ICD-10-CM | POA: Diagnosis not present

## 2018-12-06 DIAGNOSIS — E8581 Light chain (AL) amyloidosis: Secondary | ICD-10-CM | POA: Diagnosis not present

## 2018-12-06 DIAGNOSIS — J1289 Other viral pneumonia: Secondary | ICD-10-CM | POA: Diagnosis not present

## 2018-12-06 DIAGNOSIS — I2699 Other pulmonary embolism without acute cor pulmonale: Secondary | ICD-10-CM | POA: Diagnosis not present

## 2018-12-06 DIAGNOSIS — I129 Hypertensive chronic kidney disease with stage 1 through stage 4 chronic kidney disease, or unspecified chronic kidney disease: Secondary | ICD-10-CM | POA: Diagnosis not present

## 2018-12-06 DIAGNOSIS — D696 Thrombocytopenia, unspecified: Secondary | ICD-10-CM | POA: Diagnosis not present

## 2018-12-06 DIAGNOSIS — N183 Chronic kidney disease, stage 3 (moderate): Secondary | ICD-10-CM | POA: Diagnosis not present

## 2018-12-06 DIAGNOSIS — U071 COVID-19: Secondary | ICD-10-CM | POA: Diagnosis not present

## 2018-12-06 DIAGNOSIS — G40909 Epilepsy, unspecified, not intractable, without status epilepticus: Secondary | ICD-10-CM | POA: Diagnosis not present

## 2018-12-06 DIAGNOSIS — K5730 Diverticulosis of large intestine without perforation or abscess without bleeding: Secondary | ICD-10-CM | POA: Diagnosis not present

## 2018-12-07 DIAGNOSIS — N183 Chronic kidney disease, stage 3 (moderate): Secondary | ICD-10-CM | POA: Diagnosis not present

## 2018-12-07 DIAGNOSIS — J1289 Other viral pneumonia: Secondary | ICD-10-CM | POA: Diagnosis not present

## 2018-12-07 DIAGNOSIS — I2510 Atherosclerotic heart disease of native coronary artery without angina pectoris: Secondary | ICD-10-CM | POA: Diagnosis not present

## 2018-12-07 DIAGNOSIS — I2699 Other pulmonary embolism without acute cor pulmonale: Secondary | ICD-10-CM | POA: Diagnosis not present

## 2018-12-07 DIAGNOSIS — I129 Hypertensive chronic kidney disease with stage 1 through stage 4 chronic kidney disease, or unspecified chronic kidney disease: Secondary | ICD-10-CM | POA: Diagnosis not present

## 2018-12-07 DIAGNOSIS — U071 COVID-19: Secondary | ICD-10-CM | POA: Diagnosis not present

## 2018-12-07 DIAGNOSIS — G40909 Epilepsy, unspecified, not intractable, without status epilepticus: Secondary | ICD-10-CM | POA: Diagnosis not present

## 2018-12-07 DIAGNOSIS — E8581 Light chain (AL) amyloidosis: Secondary | ICD-10-CM | POA: Diagnosis not present

## 2018-12-07 DIAGNOSIS — K5730 Diverticulosis of large intestine without perforation or abscess without bleeding: Secondary | ICD-10-CM | POA: Diagnosis not present

## 2018-12-07 DIAGNOSIS — D696 Thrombocytopenia, unspecified: Secondary | ICD-10-CM | POA: Diagnosis not present

## 2018-12-10 DIAGNOSIS — J1289 Other viral pneumonia: Secondary | ICD-10-CM | POA: Diagnosis not present

## 2018-12-10 DIAGNOSIS — K5730 Diverticulosis of large intestine without perforation or abscess without bleeding: Secondary | ICD-10-CM | POA: Diagnosis not present

## 2018-12-10 DIAGNOSIS — I2699 Other pulmonary embolism without acute cor pulmonale: Secondary | ICD-10-CM | POA: Diagnosis not present

## 2018-12-10 DIAGNOSIS — U071 COVID-19: Secondary | ICD-10-CM | POA: Diagnosis not present

## 2018-12-10 DIAGNOSIS — N183 Chronic kidney disease, stage 3 (moderate): Secondary | ICD-10-CM | POA: Diagnosis not present

## 2018-12-10 DIAGNOSIS — D696 Thrombocytopenia, unspecified: Secondary | ICD-10-CM | POA: Diagnosis not present

## 2018-12-10 DIAGNOSIS — G40909 Epilepsy, unspecified, not intractable, without status epilepticus: Secondary | ICD-10-CM | POA: Diagnosis not present

## 2018-12-10 DIAGNOSIS — I129 Hypertensive chronic kidney disease with stage 1 through stage 4 chronic kidney disease, or unspecified chronic kidney disease: Secondary | ICD-10-CM | POA: Diagnosis not present

## 2018-12-10 DIAGNOSIS — E8581 Light chain (AL) amyloidosis: Secondary | ICD-10-CM | POA: Diagnosis not present

## 2018-12-10 DIAGNOSIS — I2510 Atherosclerotic heart disease of native coronary artery without angina pectoris: Secondary | ICD-10-CM | POA: Diagnosis not present

## 2018-12-11 DIAGNOSIS — I129 Hypertensive chronic kidney disease with stage 1 through stage 4 chronic kidney disease, or unspecified chronic kidney disease: Secondary | ICD-10-CM | POA: Diagnosis not present

## 2018-12-11 DIAGNOSIS — K5730 Diverticulosis of large intestine without perforation or abscess without bleeding: Secondary | ICD-10-CM | POA: Diagnosis not present

## 2018-12-11 DIAGNOSIS — J1289 Other viral pneumonia: Secondary | ICD-10-CM | POA: Diagnosis not present

## 2018-12-11 DIAGNOSIS — I2510 Atherosclerotic heart disease of native coronary artery without angina pectoris: Secondary | ICD-10-CM | POA: Diagnosis not present

## 2018-12-11 DIAGNOSIS — I2699 Other pulmonary embolism without acute cor pulmonale: Secondary | ICD-10-CM | POA: Diagnosis not present

## 2018-12-11 DIAGNOSIS — N183 Chronic kidney disease, stage 3 (moderate): Secondary | ICD-10-CM | POA: Diagnosis not present

## 2018-12-11 DIAGNOSIS — U071 COVID-19: Secondary | ICD-10-CM | POA: Diagnosis not present

## 2018-12-11 DIAGNOSIS — G40909 Epilepsy, unspecified, not intractable, without status epilepticus: Secondary | ICD-10-CM | POA: Diagnosis not present

## 2018-12-11 DIAGNOSIS — D696 Thrombocytopenia, unspecified: Secondary | ICD-10-CM | POA: Diagnosis not present

## 2018-12-11 DIAGNOSIS — E8581 Light chain (AL) amyloidosis: Secondary | ICD-10-CM | POA: Diagnosis not present

## 2018-12-12 DIAGNOSIS — I2699 Other pulmonary embolism without acute cor pulmonale: Secondary | ICD-10-CM | POA: Diagnosis not present

## 2018-12-12 DIAGNOSIS — D696 Thrombocytopenia, unspecified: Secondary | ICD-10-CM | POA: Diagnosis not present

## 2018-12-12 DIAGNOSIS — K5730 Diverticulosis of large intestine without perforation or abscess without bleeding: Secondary | ICD-10-CM | POA: Diagnosis not present

## 2018-12-12 DIAGNOSIS — U071 COVID-19: Secondary | ICD-10-CM | POA: Diagnosis not present

## 2018-12-12 DIAGNOSIS — I2510 Atherosclerotic heart disease of native coronary artery without angina pectoris: Secondary | ICD-10-CM | POA: Diagnosis not present

## 2018-12-12 DIAGNOSIS — G40909 Epilepsy, unspecified, not intractable, without status epilepticus: Secondary | ICD-10-CM | POA: Diagnosis not present

## 2018-12-12 DIAGNOSIS — N183 Chronic kidney disease, stage 3 (moderate): Secondary | ICD-10-CM | POA: Diagnosis not present

## 2018-12-12 DIAGNOSIS — Z7901 Long term (current) use of anticoagulants: Secondary | ICD-10-CM | POA: Diagnosis not present

## 2018-12-12 DIAGNOSIS — I129 Hypertensive chronic kidney disease with stage 1 through stage 4 chronic kidney disease, or unspecified chronic kidney disease: Secondary | ICD-10-CM | POA: Diagnosis not present

## 2018-12-12 DIAGNOSIS — J1289 Other viral pneumonia: Secondary | ICD-10-CM | POA: Diagnosis not present

## 2018-12-12 DIAGNOSIS — E8581 Light chain (AL) amyloidosis: Secondary | ICD-10-CM | POA: Diagnosis not present

## 2018-12-14 DIAGNOSIS — U071 COVID-19: Secondary | ICD-10-CM | POA: Diagnosis not present

## 2018-12-14 DIAGNOSIS — I2510 Atherosclerotic heart disease of native coronary artery without angina pectoris: Secondary | ICD-10-CM | POA: Diagnosis not present

## 2018-12-14 DIAGNOSIS — E8581 Light chain (AL) amyloidosis: Secondary | ICD-10-CM | POA: Diagnosis not present

## 2018-12-14 DIAGNOSIS — I129 Hypertensive chronic kidney disease with stage 1 through stage 4 chronic kidney disease, or unspecified chronic kidney disease: Secondary | ICD-10-CM | POA: Diagnosis not present

## 2018-12-14 DIAGNOSIS — K5730 Diverticulosis of large intestine without perforation or abscess without bleeding: Secondary | ICD-10-CM | POA: Diagnosis not present

## 2018-12-14 DIAGNOSIS — D696 Thrombocytopenia, unspecified: Secondary | ICD-10-CM | POA: Diagnosis not present

## 2018-12-14 DIAGNOSIS — J1289 Other viral pneumonia: Secondary | ICD-10-CM | POA: Diagnosis not present

## 2018-12-14 DIAGNOSIS — N183 Chronic kidney disease, stage 3 (moderate): Secondary | ICD-10-CM | POA: Diagnosis not present

## 2018-12-14 DIAGNOSIS — I2699 Other pulmonary embolism without acute cor pulmonale: Secondary | ICD-10-CM | POA: Diagnosis not present

## 2018-12-14 DIAGNOSIS — G40909 Epilepsy, unspecified, not intractable, without status epilepticus: Secondary | ICD-10-CM | POA: Diagnosis not present

## 2018-12-17 ENCOUNTER — Telehealth: Payer: Self-pay | Admitting: Cardiovascular Disease

## 2018-12-17 DIAGNOSIS — K5730 Diverticulosis of large intestine without perforation or abscess without bleeding: Secondary | ICD-10-CM | POA: Diagnosis not present

## 2018-12-17 DIAGNOSIS — N183 Chronic kidney disease, stage 3 (moderate): Secondary | ICD-10-CM | POA: Diagnosis not present

## 2018-12-17 DIAGNOSIS — U071 COVID-19: Secondary | ICD-10-CM | POA: Diagnosis not present

## 2018-12-17 DIAGNOSIS — I2699 Other pulmonary embolism without acute cor pulmonale: Secondary | ICD-10-CM | POA: Diagnosis not present

## 2018-12-17 DIAGNOSIS — I2510 Atherosclerotic heart disease of native coronary artery without angina pectoris: Secondary | ICD-10-CM | POA: Diagnosis not present

## 2018-12-17 DIAGNOSIS — G40909 Epilepsy, unspecified, not intractable, without status epilepticus: Secondary | ICD-10-CM | POA: Diagnosis not present

## 2018-12-17 DIAGNOSIS — I129 Hypertensive chronic kidney disease with stage 1 through stage 4 chronic kidney disease, or unspecified chronic kidney disease: Secondary | ICD-10-CM | POA: Diagnosis not present

## 2018-12-17 DIAGNOSIS — J1289 Other viral pneumonia: Secondary | ICD-10-CM | POA: Diagnosis not present

## 2018-12-17 DIAGNOSIS — D696 Thrombocytopenia, unspecified: Secondary | ICD-10-CM | POA: Diagnosis not present

## 2018-12-17 DIAGNOSIS — E8581 Light chain (AL) amyloidosis: Secondary | ICD-10-CM | POA: Diagnosis not present

## 2018-12-17 NOTE — Telephone Encounter (Signed)
New message     I called this patient to reschedule him from Brittiany's schedule to Michele's schedule because Celedonio Miyamoto will be at the hospital on this day.  Pt thought he was seeing Dr [name] on 12-26-18. He is insistant on seeing Dr [name] (any day) and not an APP.  He said that he has been "put off" from seeing Dr [name] for months and wanted him only.  I told him that I would send the nurse this message and we would call him back to reschedule this appt.  Can you help?

## 2018-12-17 NOTE — Telephone Encounter (Signed)
I spoke with pt. I told him he saw Dr. [name] on March 5,2020 with one year follow up planned.  Pt did not remember this appointment. Appt scheduled for September 2 was made based on referral from Dr. [name] [facility]) on June 22,2020 for shortness of breath/CAD.  Pt has since been diagnosed with COVID/pneumonia and was hospitalized from July 5-July 25.  Pt does not remember seeing Dr. [name] in June and reports it is "very possible" referral was made for shortness of breath he was having at that time.  Pt does not know if he needs cardiology appt at this time.  He states we contacted him for appointment and he would like doctor to determine if he needs appointment. He reports he is not having any chest pain. Does have shortness of breath at times.  He has had a virtual visit with Dr. [name] since hospital discharge. I told pt I would contact [place] and see if cardiology referral was still indicated. I told pt I would cancel September 2 appointment.

## 2018-12-18 DIAGNOSIS — N400 Enlarged prostate without lower urinary tract symptoms: Secondary | ICD-10-CM | POA: Diagnosis not present

## 2018-12-18 DIAGNOSIS — R972 Elevated prostate specific antigen [PSA]: Secondary | ICD-10-CM | POA: Diagnosis not present

## 2018-12-19 ENCOUNTER — Other Ambulatory Visit: Payer: Self-pay | Admitting: Internal Medicine

## 2018-12-19 ENCOUNTER — Ambulatory Visit
Admission: RE | Admit: 2018-12-19 | Discharge: 2018-12-19 | Disposition: A | Discharge status: Home / Self Care | Payer: Medicare HMO | Source: Ambulatory Visit | Attending: Internal Medicine | Admitting: Internal Medicine

## 2018-12-19 DIAGNOSIS — U071 COVID-19: Secondary | ICD-10-CM

## 2018-12-19 DIAGNOSIS — I2699 Other pulmonary embolism without acute cor pulmonale: Secondary | ICD-10-CM | POA: Diagnosis not present

## 2018-12-19 DIAGNOSIS — I2510 Atherosclerotic heart disease of native coronary artery without angina pectoris: Secondary | ICD-10-CM | POA: Diagnosis not present

## 2018-12-19 DIAGNOSIS — D696 Thrombocytopenia, unspecified: Secondary | ICD-10-CM | POA: Diagnosis not present

## 2018-12-19 DIAGNOSIS — J9811 Atelectasis: Secondary | ICD-10-CM | POA: Diagnosis not present

## 2018-12-19 DIAGNOSIS — G40909 Epilepsy, unspecified, not intractable, without status epilepticus: Secondary | ICD-10-CM | POA: Diagnosis not present

## 2018-12-19 DIAGNOSIS — J1289 Other viral pneumonia: Secondary | ICD-10-CM | POA: Diagnosis not present

## 2018-12-19 DIAGNOSIS — N183 Chronic kidney disease, stage 3 (moderate): Secondary | ICD-10-CM | POA: Diagnosis not present

## 2018-12-19 DIAGNOSIS — J90 Pleural effusion, not elsewhere classified: Secondary | ICD-10-CM | POA: Diagnosis not present

## 2018-12-19 DIAGNOSIS — K5730 Diverticulosis of large intestine without perforation or abscess without bleeding: Secondary | ICD-10-CM | POA: Diagnosis not present

## 2018-12-19 DIAGNOSIS — E8581 Light chain (AL) amyloidosis: Secondary | ICD-10-CM | POA: Diagnosis not present

## 2018-12-19 DIAGNOSIS — I129 Hypertensive chronic kidney disease with stage 1 through stage 4 chronic kidney disease, or unspecified chronic kidney disease: Secondary | ICD-10-CM | POA: Diagnosis not present

## 2018-12-19 NOTE — Telephone Encounter (Signed)
I reviewed with Dr. [name] who feels pt does not need visit at this time if feeling better. I spoke with pt and gave him this information.  He reports he still has some shortness of breath at times. It has improved since referral was placed. I advised pt to follow up with Dr. [name] and [facility] as planned.

## 2018-12-20 DIAGNOSIS — K5730 Diverticulosis of large intestine without perforation or abscess without bleeding: Secondary | ICD-10-CM | POA: Diagnosis not present

## 2018-12-20 DIAGNOSIS — I2510 Atherosclerotic heart disease of native coronary artery without angina pectoris: Secondary | ICD-10-CM | POA: Diagnosis not present

## 2018-12-20 DIAGNOSIS — G40909 Epilepsy, unspecified, not intractable, without status epilepticus: Secondary | ICD-10-CM | POA: Diagnosis not present

## 2018-12-20 DIAGNOSIS — U071 COVID-19: Secondary | ICD-10-CM | POA: Diagnosis not present

## 2018-12-20 DIAGNOSIS — N183 Chronic kidney disease, stage 3 (moderate): Secondary | ICD-10-CM | POA: Diagnosis not present

## 2018-12-20 DIAGNOSIS — E8581 Light chain (AL) amyloidosis: Secondary | ICD-10-CM | POA: Diagnosis not present

## 2018-12-20 DIAGNOSIS — J1289 Other viral pneumonia: Secondary | ICD-10-CM | POA: Diagnosis not present

## 2018-12-20 DIAGNOSIS — I2699 Other pulmonary embolism without acute cor pulmonale: Secondary | ICD-10-CM | POA: Diagnosis not present

## 2018-12-20 DIAGNOSIS — D696 Thrombocytopenia, unspecified: Secondary | ICD-10-CM | POA: Diagnosis not present

## 2018-12-20 DIAGNOSIS — I129 Hypertensive chronic kidney disease with stage 1 through stage 4 chronic kidney disease, or unspecified chronic kidney disease: Secondary | ICD-10-CM | POA: Diagnosis not present

## 2018-12-26 ENCOUNTER — Ambulatory Visit: Payer: Medicare HMO | Admitting: Cardiology

## 2018-12-31 NOTE — Progress Notes (Signed)
HEMATOLOGY/ONCOLOGY CLINIC NOTE  Date of Service: 01/01/19   Patient Care Team: [name], MD as PCP - General (Internal Medicine) Burnell Blanks, MD as PCP - Cardiology (Cardiology) Brunetta Genera, MD as Consulting Physician (Hematology) Nephrology - Dr Madelon Lips MD Dr Henrene Pastor MD - GI  CHIEF COMPLAINTS/PURPOSE OF CONSULTATION:  AL Amyloidosis -with Nephrotic syndrome . No overt CHF pEF  HISTORY OF PRESENTING ILLNESS:   Alex [name]. is a wonderful 78 y.o. male who has been referred to [place] by Dr .[name], MD /Dr Madelon Lips MD for evaluation and management of newly diagnosed Kidney AL Amyloidosis with nephrotic syndrome.  Patient has a history of hypertension, dyslipidemia, coronary artery disease, seizure disorder on Dilantin who was apparently in his usual state of health until 3 months ago when he started developing new onset lower extremity edema. Patient had a UA at the time that showed 4+ protein in 24-hour collection revealed 15 g of protein. Additional workup showed a creatinine of 0.8 with an albumin of 2.3 and negative SPEP and UPEP. Apparently had an elevated K/L SFLC ratio of 5.19.  He was urgently referred by his primary care physician to nephrology for additional evaluation. Patient notes no bleeding issues. No nosebleeds. No periorbital bleeding. No abnormal skin rashes. Has had significant NSAID exposure and use to take a fair amount of naproxen for chronic low back pain but has cut down on this.  Due to lack of clear etiology for his nephrotic syndrome the patient underwent a kidney biopsy on 08/06/2015 accession [id number] which showed AL amyloidosis, lambda immunophenotype, [place] red positive. Noted to have severe arteriosclerosis with moderate tubular interstitial scarring.  Patient was referred to [place] for further evaluation and treatment of his AL Amyloidosis, Patient notes that his leg swelling has improved with diuretic  therapy.  He notes no weight loss. No night sweats. No focal bone pains. No skin rashes. Lungs normal bleeding or bruising.  Patient notes that he had a lot of reading online and he and his wife had an extensive list of questions which were answered in detail.  He notes he has had some chronic issues with upper abdominal pain and nausea and that he follows with Dr. Henrene Pastor and is apparently being scheduled for an EGD and colonoscopy according to his report.  Denies having and lacks tongue. No chest pain and no overt new shortness of breath. Patient is uncertain if an echocardiogram has been ordered or scheduled.  INTERVAL HISTORY  [name] is here for a scheduled follow-up for his AL amyloidosis. The patient's last visit with [place] was on 10/01/2018. The pt reports that he is doing well overall. He was admitted for severe COVID 19 pneumonia about 1 month ago- rx with dexamethasone, remdesivir, plaquenil.  The pt reports that he is doing okay but having difficulty breathing. He has been on 2 liters of oxygen. He walks in his home, but not outside. Pt experiences significant increase in SOB while walking. He is going to see his PCP on Friday but has no plans to follow up with a Pulmonologist. He is still on Eliquis 5 mg, twice per day. Pt does feel like he is on the road to recovery but that it is happening slowly.   Pt has still been experiencing neuropathy of his hands and feet.   Of note since the patient's last visit, pt has had DG Chest 2 View completed on 12/19/2018 with results revealing "1. Bilateral pulmonary  infiltrates again noted. Slight improvement from prior exam. Bibasilar atelectasis. Tiny right pleural effusion cannot be excluded. 2. Stable cardiomegaly."  Lab results today (01/01/19) of CBC w/diff and CMP is as follows: all values are WNL except for RBC at 4.09, Hgb at 12.8, HCT at 38.9, Platelets at 149K, Calcium at 8.8, GFR Est Non Af Am at 57. 01/01/2019 K/L light chains is  in progress  01/01/2019 MMP is in progress   On review of systems, pt reports SOB, neuropathy in hands and feet and denies fever, new cough and any other symptoms.   MEDICAL HISTORY:  Past Medical History:  Diagnosis Date  . Amyloidosis ([place])   . Arthritis   . Basal cell carcinoma   . CAD (coronary artery disease)    Lexiscan Myoview 6/14: Normal study, no scar or ischemia, EF 62%  . Cataract   . Chronic kidney disease   . Colon polyp   . Coronary artery disease    moderate disease by cath 2011  . Diverticulosis of colon   . GERD (gastroesophageal reflux disease)   . Hearing loss   . Heart disease   . Hiatal hernia   . Hyperlipidemia   . Hypertension   . MRSA (methicillin resistant staph aureus) culture positive   . Seizure disorder ([place])   . Seizures ([place])    last in 1970s  Obstructive sleep apnea Gastroesophageal reflux disease Aortic stenosis Erectile dysfunction Peripheral neuropathy Seizure disorder   SURGICAL HISTORY: Past Surgical History:  Procedure Laterality Date  . BUNIONECTOMY    . CARPAL TUNNEL RELEASE  1994   left  . CERVICAL FUSION    . compression fracture    . FOOT SURGERY     right -twice, left foot once  . HERNIA REPAIR     Umbilical with PVP  . KNEE ARTHROSCOPY     left knee  . [place]   left - scope  . NASAL SEPTUM SURGERY    . NECK SURGERY    . NOSE SURGERY     twice  . PAROTIDECTOMY Right 03/09/2017  . ROTATOR CUFF REPAIR     twice, both shoulders  . ROTATOR CUFF REPAIR    . SHOULDER SURGERY    . SKIN BIOPSY    . SPINE SURGERY     C2, C3, C4  . TRIGGER FINGER RELEASE     both hands, twice  . TRIGGER FINGER RELEASE    . VASECTOMY      SOCIAL HISTORY: Social History   Socioeconomic History  . Marital status: Married    Spouse name: Not on file  . Number of children: 2  . Years of education: 27  . Highest education level: Not on file  Occupational History  . Occupation: Retired  Scientific laboratory technician  . Financial  resource strain: Not on file  . Food insecurity    Worry: Not on file    Inability: Not on file  . Transportation needs    Medical: Not on file    Non-medical: Not on file  Tobacco Use  . Smoking status: Former Smoker    Quit date: 04/25/1969    Years since quitting: 49.7  . Smokeless tobacco: Former Network engineer and Sexual Activity  . Alcohol use: Yes    Alcohol/week: 7.0 - 14.0 standard drinks    Types: 7 - 14 Glasses of wine per week    Comment: socially shots of wiskey  . Drug use: No  . Sexual activity:  Not on file  Lifestyle  . Physical activity    Days per week: Not on file    Minutes per session: Not on file  . Stress: Not on file  Relationships  . Social Herbalist on phone: Not on file    Gets together: Not on file    Attends religious service: Not on file    Active member of club or organization: Not on file    Attends meetings of clubs or organizations: Not on file    Relationship status: Not on file  . Intimate partner violence    Fear of current or ex partner: Not on file    Emotionally abused: Not on file    Physically abused: Not on file    Forced sexual activity: Not on file  Other Topics Concern  . Not on file  Social History Narrative   ** Merged History Encounter **   Lives at home w/ his wife   Right-handed   Caffeine: occasional Pepsi      Patient is currently retired.   FAMILY HISTORY: Family History  Problem Relation Age of Onset  . Diabetes Father   . Heart disease Father   . Heart attack Father 44  . Hypertension Father   . Cancer Sister   . Hearing loss Mother   . Eczema Sister   . Hyperlipidemia Other   . Diabetes Other   . Hypertension Other   . Seizures Other   . Thyroid disease Other   . Colon cancer Neg Hx   . Stroke Neg Hx     ALLERGIES:  is allergic to ramipril.  MEDICATIONS:  Current Outpatient Medications  Medication Sig Dispense Refill  . acyclovir (ZOVIRAX) 400 MG tablet Take 400 mg by mouth 2  (two) times daily.    [name] Kitchen albuterol (VENTOLIN HFA) 108 (90 Base) MCG/ACT inhaler Inhale 2 puffs into the lungs every 4 (four) hours as needed for shortness of breath or wheezing.    [name] Kitchen apixaban (ELIQUIS) 5 MG TABS tablet Take 1 tablet (5 mg total) by mouth 2 (two) times daily. 60 tablet 1  . Ascorbic Acid (VITAMIN C) 1000 MG tablet Take 1,000 mg by mouth 2 (two) times a day.     [name] Kitchen aspirin EC 81 MG tablet Take 81 mg by mouth daily.    [name] Kitchen atorvastatin (LIPITOR) 40 MG tablet Take 40 mg by mouth at bedtime.     . cetirizine (ZYRTEC) 10 MG tablet Take 10 mg by mouth daily. Takes as needed    . Cholecalciferol (D3-1000) 25 MCG (1000 UT) tablet Take 1,000 Units by mouth 2 (two) times a day.     . ezetimibe (ZETIA) 10 MG tablet Take 10 mg by mouth daily.     . folic acid (FOLVITE) 1 MG tablet Take 1 mg by mouth at bedtime.     . isosorbide mononitrate (IMDUR) 15 mg TB24 24 hr tablet Take 15 mg by mouth daily.    . Multiple Vitamin (MULTI-VITAMINS) TABS Take 1 tablet by mouth daily.    . mupirocin ointment (BACTROBAN) 2 % Apply 1 application topically as needed (rash).     . nitroGLYCERIN (NITROSTAT) 0.4 MG SL tablet Place 0.4 mg under the tongue every 5 (five) minutes as needed for chest pain.    [name] Kitchen ondansetron (ZOFRAN) 8 MG tablet Take 8 mg by mouth 2 (two) times daily as needed for nausea or vomiting.    . pantoprazole (PROTONIX) 40 MG tablet Take  80 mg by mouth daily.     [name] Kitchen triamcinolone cream (KENALOG) 0.1 % Apply 1 application topically as needed for rash.     No current facility-administered medications for this visit.     REVIEW OF SYSTEMS:    A 10+ POINT REVIEW OF SYSTEMS WAS OBTAINED including neurology, dermatology, psychiatry, cardiac, respiratory, lymph, extremities, GI, GU, Musculoskeletal, constitutional, breasts, reproductive, HEENT.  All pertinent positives are noted in the HPI.  All others are negative.   PHYSICAL EXAMINATION: ECOG PERFORMANCE STATUS: 1 - Symptomatic but completely  ambulatory  Vitals:   01/01/19 1406  BP: 119/67  Pulse: 82  Resp: 18  Temp: 98.2 F (36.8 C)  SpO2: 97%   Filed Weights   01/01/19 1406  Weight: 189 lb 14.4 oz (86.1 kg)   .Body mass index is 27.25 kg/m.  GENERAL:alert, in no acute distress and comfortable SKIN: no acute rashes, no significant lesions EYES: conjunctiva are pink and non-injected, sclera anicteric OROPHARYNX: MMM, no exudates, no oropharyngeal erythema or ulceration NECK: supple, no JVD LYMPH:  no palpable lymphadenopathy in the cervical, axillary or inguinal regions LUNGS: increased respiratory effort, crackles right base HEART: regular rate & rhythm ABDOMEN:  normoactive bowel sounds , non tender, not distended. No palpable hepatosplenomegaly.  Extremity: no pedal edema PSYCH: alert & oriented x 3 with fluent speech NEURO: no focal motor/sensory deficits  LABORATORY DATA:  I have reviewed the data as listed  . CBC Latest Ref Rng & Units 01/01/2019 11/15/2018 11/14/2018  WBC 4.0 - 10.5 K/uL 8.8 6.0 7.4  Hemoglobin 13.0 - 17.0 g/dL 12.8(L) 12.0(L) 12.9(L)  Hematocrit 39.0 - 52.0 % 38.9(L) 37.0(L) 40.9  Platelets 150 - 400 K/uL 149(L) 84(L) 96(L)    CMP Latest Ref Rng & Units 01/01/2019 11/15/2018 11/14/2018  Glucose 70 - 99 mg/dL 96 85 99  BUN 8 - 23 mg/dL 17 32(H) 35(H)  Creatinine 0.61 - 1.24 mg/dL 1.23 1.17 1.19  Sodium 135 - 145 mmol/L 139 141 142  Potassium 3.5 - 5.1 mmol/L 3.8 4.1 4.9  Chloride 98 - 111 mmol/L 104 107 107  CO2 22 - 32 mmol/L '25 27 27  ' Calcium 8.9 - 10.3 mg/dL 8.8(L) 8.1(L) 8.3(L)  Total Protein 6.5 - 8.1 g/dL 6.8 5.1(L) 5.3(L)  Total Bilirubin 0.3 - 1.2 mg/dL 0.8 0.9 1.0  Alkaline Phos 38 - 126 U/L 80 63 66  AST 15 - 41 U/L '20 26 29  ' ALT 0 - 44 U/L 18 33 39        RADIOGRAPHIC STUDIES: I have personally reviewed the radiological images as listed and agreed with the findings in the report. Dg Chest 2 View  Result Date: 12/20/2018 CLINICAL DATA:  History of COVID-19 related  pneumonia. EXAM: CHEST - 2 VIEW COMPARISON:  11/17/2018. FINDINGS: Stable cardiomegaly. Bilateral pulmonary infiltrates again noted. Slight improvement from prior exam. Bibasilar atelectasis. Tiny right pleural effusion cannot be excluded. No pneumothorax. IMPRESSION: 1. Bilateral pulmonary infiltrates again noted. Slight improvement from prior exam. Bibasilar atelectasis. Tiny right pleural effusion cannot be excluded. 2.  Stable cardiomegaly. Electronically Signed   By: [place]   On: 12/20/2018 07:34   Result status: Edited Result - FINAL                              *[facility]*  [address] [employer] [address]                        [place], [place] [address]                            [phone]  ------------------------------------------------------------------- Transthoracic Echocardiography  (Report amended )  Patient:    Kymari, Nuon MR #:       [id number] Study Date: 08/24/2016 Gender:     M Age:        34 Height:     180.3 cm Weight:     90.6 kg BSA:        2.15 m^2 Pt. Status: Room:   ATTENDING    Darlina Guys, MD  Willia Craze, Christopher  REFERRING    [place], [place], Outpatient  SONOGRAPHER  Roseanna Rainbow  cc:  ------------------------------------------------------------------- LV EF: 65% -   70%  ------------------------------------------------------------------- Indications:      Aortic stenosis 424.1.  ------------------------------------------------------------------- History:   PMH:  Amyloidosis.  Coronary artery disease.  Angina pectoris.  Risk factors:  Hypertension. Dyslipidemia.  ------------------------------------------------------------------- Study Conclusions  - Left ventricle: The cavity size was normal. Wall thickness was   increased in a pattern of mild LVH. Systolic function was   vigorous. The estimated ejection fraction  was in the range of 65%   to 70%. Wall motion was normal; there were no regional wall   motion abnormalities. Doppler parameters are consistent with   abnormal left ventricular relaxation (grade 1 diastolic   dysfunction). - Aortic valve: Valve mobility was mildly restricted. - Aortic root: The aortic root was mildly dilated. - Mitral valve: Calcified annulus.  Impressions:  - Vigorous LV systolic function; mild diastolic dysfunction; mild   LVH; calcified aortic valve with no significant AS (peak velocity   2 m/s; mean gradient 9 mmHg); mildly dilated aortic root.    ASSESSMENT & PLAN:   78 y.o.  male with above-mentioned multiple medical comorbidities with  #1 Biopsy proven Renal and Systemic AL Amyloidosis Initial BM Bx shows 9% plasma cells and amyloid deposits. Echo shows normal systolic function with only grade 1 diastolic dysfunction. Skeletal survey with no lytic lesions suggestive of multiple myeloma. Serum K/L ratio increased at about 6.22 on diagnosis and has now improved to 1.5 (wnl with normal kappa lambda serum free light chain ratio ) UPEP shows 24h protein improved from 15g to 5g daily 05/29/18 24hour UPEP no M spike   01/12/18 ECHO revealed LV EF of 60-65%   #2  Nephrotic Syndrome - likely related to AL amyloidosis.  Urine Study 07/26/17 showed Protein at 2.232g.  -07/19/17 the pt had a BM Bx exam which revealed Normocellular bone marrow (30-40%) with 2-3% plasma cells and focal amyloid deposition. UPEP 05/29/2018 - showed no M spike and total urinary protein of 132m/24h -- resolution of nephrotic syndrome.  PLAN: -Discussed pt labwork today, 01/01/19; all values are WNL except for RBC at 4.09, Hgb at 12.8, HCT at 38.9, Platelets at 149K, Calcium at 8.8, GFR Est Non Af Am at 57. -Discussed 01/01/2019 K/L light chains is in progress  -Discussed 01/01/2019 MMP - no M spike -Grade 1 neuropathy- minimal symptoms- some improvement, will continue to watch this, the pt  does not prefer to take any medications for it this at this time -Continue 4034mAcyclovir BID -Continue Vitamin B complex  -Will continue  repeat 24 hour urine study and repeat ECHO every 6 months -Recommended that the pt receive his annual flu vaccine, and continue follow up with his transplant team for his vaccinations  -Use anti-histamines as needed -Will refer to a Pulmonologist  -Will hold off on [place] and monitor with labs for now -Discussed that it will take several months to determine the extent of the damage caused by his COVID19 related Pneumonia -Will see in 2 months with labs  #4 Patient Active Problem List   Diagnosis Date Noted  . Pneumonia due to COVID-19 virus 10/29/2018  . Acute respiratory failure with hypoxia ([place]) 10/29/2018  . Thrombocytopenia ([place]) 10/29/2018  . Left lower lobe pneumonia ([place]) 04/22/2018  . Sepsis due to pneumonia ([place]) 04/22/2018  . Folliculitis 61/95/0932  . AL amyloidosis ([place]) 08/21/2016  . Umbilical hernia 67/03/4579  . DIZZINESS 02/19/2010  . CAD, NATIVE VESSEL 07/21/2009  . CHEST PAIN-UNSPECIFIED 07/21/2009  . HYPERLIPIDEMIA 04/30/2007  . Essential hypertension 04/30/2007  . GASTROESOPHAGEAL REFLUX DISEASE 04/30/2007  . SEIZURE DISORDER 04/30/2007  . COMPRESSION FRACTURE, L1 VERTEBRA 04/30/2007  . DIVERTICULOSIS, COLON, HX OF 04/30/2007  . Other postprocedural status(V45.89) 04/30/2007  -Continue follow-up with primary care physician for management of other medical co-morbidities. -Recommend if pt experiences spike fever to report to hospital with transplant team    FOLLOW UP: -Referral to [facility] for mx of post COVID lung disease (Oxygen dependent at this time) in 1-2 weeks - RTC with Dr [name] with labs in 2 months   The total time spent in the appt was 25 minutes and more than 50% was on counseling and direct patient cares.  All of the patient's questions were answered with apparent satisfaction. The patient knows to  call the clinic with any problems, questions or concerns.    Sullivan Lone MD [place] AAHIVMS [facility] Hematology/Oncology Physician [facility]  (Office):       [phone] (Work cell):  [phone] (Fax):           [phone]  I, Yevette Edwards, am acting as a scribe for Dr. Sullivan Lone.   .I have reviewed the above documentation for accuracy and completeness, and I agree with the above. Brunetta Genera MD

## 2019-01-01 ENCOUNTER — Inpatient Hospital Stay (HOSPITAL_BASED_OUTPATIENT_CLINIC_OR_DEPARTMENT_OTHER): Payer: Medicare HMO | Admitting: Hematology

## 2019-01-01 ENCOUNTER — Other Ambulatory Visit: Payer: Self-pay

## 2019-01-01 ENCOUNTER — Inpatient Hospital Stay: Payer: Medicare HMO | Attending: Hematology

## 2019-01-01 VITALS — BP 119/67 | HR 82 | Temp 98.2°F | Resp 18 | Ht 70.0 in | Wt 189.9 lb

## 2019-01-01 DIAGNOSIS — G629 Polyneuropathy, unspecified: Secondary | ICD-10-CM | POA: Insufficient documentation

## 2019-01-01 DIAGNOSIS — E785 Hyperlipidemia, unspecified: Secondary | ICD-10-CM | POA: Insufficient documentation

## 2019-01-01 DIAGNOSIS — E8581 Light chain (AL) amyloidosis: Secondary | ICD-10-CM | POA: Diagnosis not present

## 2019-01-01 DIAGNOSIS — J9611 Chronic respiratory failure with hypoxia: Secondary | ICD-10-CM

## 2019-01-01 DIAGNOSIS — Z7982 Long term (current) use of aspirin: Secondary | ICD-10-CM | POA: Diagnosis not present

## 2019-01-01 DIAGNOSIS — G4733 Obstructive sleep apnea (adult) (pediatric): Secondary | ICD-10-CM | POA: Insufficient documentation

## 2019-01-01 DIAGNOSIS — Z8619 Personal history of other infectious and parasitic diseases: Secondary | ICD-10-CM

## 2019-01-01 DIAGNOSIS — I10 Essential (primary) hypertension: Secondary | ICD-10-CM | POA: Diagnosis not present

## 2019-01-01 DIAGNOSIS — G40909 Epilepsy, unspecified, not intractable, without status epilepticus: Secondary | ICD-10-CM | POA: Diagnosis not present

## 2019-01-01 DIAGNOSIS — Z87891 Personal history of nicotine dependence: Secondary | ICD-10-CM | POA: Insufficient documentation

## 2019-01-01 DIAGNOSIS — D696 Thrombocytopenia, unspecified: Secondary | ICD-10-CM

## 2019-01-01 DIAGNOSIS — Z85828 Personal history of other malignant neoplasm of skin: Secondary | ICD-10-CM | POA: Insufficient documentation

## 2019-01-01 DIAGNOSIS — Z7901 Long term (current) use of anticoagulants: Secondary | ICD-10-CM | POA: Diagnosis not present

## 2019-01-01 DIAGNOSIS — N049 Nephrotic syndrome with unspecified morphologic changes: Secondary | ICD-10-CM | POA: Insufficient documentation

## 2019-01-01 DIAGNOSIS — Z79899 Other long term (current) drug therapy: Secondary | ICD-10-CM | POA: Diagnosis not present

## 2019-01-01 DIAGNOSIS — N189 Chronic kidney disease, unspecified: Secondary | ICD-10-CM | POA: Insufficient documentation

## 2019-01-01 DIAGNOSIS — Z9981 Dependence on supplemental oxygen: Secondary | ICD-10-CM | POA: Diagnosis not present

## 2019-01-01 LAB — CMP (CANCER CENTER ONLY)
ALT: 18 U/L (ref 0–44)
AST: 20 U/L (ref 15–41)
Albumin: 3.6 g/dL (ref 3.5–5.0)
Alkaline Phosphatase: 80 U/L (ref 38–126)
Anion gap: 10 (ref 5–15)
BUN: 17 mg/dL (ref 8–23)
CO2: 25 mmol/L (ref 22–32)
Calcium: 8.8 mg/dL — ABNORMAL LOW (ref 8.9–10.3)
Chloride: 104 mmol/L (ref 98–111)
Creatinine: 1.23 mg/dL (ref 0.61–1.24)
GFR, Est AFR Am: >60 mL/min (ref >60)
GFR, Estimated: 57 mL/min — ABNORMAL LOW (ref >60)
Glucose, Bld: 96 mg/dL (ref 70–99)
Potassium: 3.8 mmol/L (ref 3.5–5.1)
Sodium: 139 mmol/L (ref 135–145)
Total Bilirubin: 0.8 mg/dL (ref 0.3–1.2)
Total Protein: 6.8 g/dL (ref 6.5–8.1)

## 2019-01-01 LAB — CBC WITH DIFFERENTIAL/PLATELET
Abs Immature Granulocytes: 0.03 10*3/uL (ref 0.00–0.07)
Basophils Absolute: 0 10*3/uL (ref 0.0–0.1)
Basophils Relative: 1 %
Eosinophils Absolute: 0.2 10*3/uL (ref 0.0–0.5)
Eosinophils Relative: 3 %
HCT: 38.9 % — ABNORMAL LOW (ref 39.0–52.0)
Hemoglobin: 12.8 g/dL — ABNORMAL LOW (ref 13.0–17.0)
Immature Granulocytes: 0 %
Lymphocytes Relative: 14 %
Lymphs Abs: 1.2 10*3/uL (ref 0.7–4.0)
MCH: 31.3 pg (ref 26.0–34.0)
MCHC: 32.9 g/dL (ref 30.0–36.0)
MCV: 95.1 fL (ref 80.0–100.0)
Monocytes Absolute: 0.8 10*3/uL (ref 0.1–1.0)
Monocytes Relative: 9 %
Neutro Abs: 6.5 10*3/uL (ref 1.7–7.7)
Neutrophils Relative %: 73 %
Platelets: 149 10*3/uL — ABNORMAL LOW (ref 150–400)
RBC: 4.09 MIL/uL — ABNORMAL LOW (ref 4.22–5.81)
RDW: 14.9 % (ref 11.5–15.5)
WBC: 8.8 10*3/uL (ref 4.0–10.5)
nRBC: 0 % (ref 0.0–0.2)

## 2019-01-02 LAB — MULTIPLE MYELOMA PANEL, SERUM
Albumin SerPl Elph-Mcnc: 3.4 g/dL (ref 2.9–4.4)
Albumin/Glob SerPl: 1.3 (ref 0.7–1.7)
Alpha 1: 0.2 g/dL (ref 0.0–0.4)
Alpha2 Glob SerPl Elph-Mcnc: 1.1 g/dL — ABNORMAL HIGH (ref 0.4–1.0)
B-Globulin SerPl Elph-Mcnc: 0.8 g/dL (ref 0.7–1.3)
Gamma Glob SerPl Elph-Mcnc: 0.6 g/dL (ref 0.4–1.8)
Globulin, Total: 2.7 g/dL (ref 2.2–3.9)
IgA: 91 mg/dL (ref 61–437)
IgG (Immunoglobin G), Serum: 751 mg/dL (ref 603–1613)
IgM (Immunoglobulin M), Srm: 54 mg/dL (ref 15–143)
Total Protein ELP: 6.1 g/dL (ref 6.0–8.5)

## 2019-01-02 LAB — KAPPA/LAMBDA LIGHT CHAINS
Kappa free light chain: 31.6 mg/L — ABNORMAL HIGH (ref 3.3–19.4)
Kappa, lambda light chain ratio: 1.37 (ref 0.26–1.65)
Lambda free light chains: 23 mg/L (ref 5.7–26.3)

## 2019-01-04 DIAGNOSIS — Z23 Encounter for immunization: Secondary | ICD-10-CM | POA: Diagnosis not present

## 2019-01-10 ENCOUNTER — Other Ambulatory Visit: Payer: Self-pay

## 2019-01-10 ENCOUNTER — Encounter: Payer: Self-pay | Admitting: Pulmonary Disease

## 2019-01-10 ENCOUNTER — Ambulatory Visit: Payer: Medicare HMO | Admitting: Pulmonary Disease

## 2019-01-10 VITALS — BP 120/66 | HR 85 | Temp 97.2°F | Ht 71.0 in | Wt 195.4 lb

## 2019-01-10 DIAGNOSIS — R0600 Dyspnea, unspecified: Secondary | ICD-10-CM | POA: Diagnosis not present

## 2019-01-10 MED ORDER — BUDESONIDE-FORMOTEROL FUMARATE 160-4.5 MCG/ACT IN AERO: 2.0000 | INHALATION_SPRAY | Freq: Two times a day (BID) | RESPIRATORY_TRACT | 6 refills | Status: DC

## 2019-01-10 MED ORDER — BUDESONIDE-FORMOTEROL FUMARATE 160-4.5 MCG/ACT IN AERO: 2.0000 | INHALATION_SPRAY | Freq: Two times a day (BID) | RESPIRATORY_TRACT | 0 refills | Status: DC

## 2019-01-10 NOTE — Progress Notes (Signed)
Alex [name].    [id number]    25-Dec-1940  Primary Care Physician:Shaw, Gwyndolyn Saxon, MD  Referring Physician: [name], MD [address] [place],  [place] [address]  Chief complaint: Consult for dyspnea, post COVID-19 pneumonia  HPI: [name] is here for evaluation of dyspnea  He has past medical history of AL amyloidosis with nephrotic syndrome, hypertension, moderate nonobstructive CAD by cath 2011, aortic stenosis, GERD, hyperlipidemia, carotid artery disease.  Hospitalized from July 5 to July 25 at [facility] for COVID-19 pneumonia. He has received treatment with Remdesivir 7/6-7/10, steroids for 10 days up until 7/15, received Actemra on 7/11 and plasma on 7/10.  He also received hydroxychloroquine per his request after discussion of risks and benefits. Hospital course complicated with subsegmental pulmonary embolism and he is on anticoagulation,  pneumomediastinum that was treated conservatively.  Maintained on high flow nasal cannula.  Did not need to be intubated He has been discharged on supplemental oxygen.  Continues to have significant dyspnea on exertion at home.  He is working with physical therapy and trying to stay active at home.  History notable for amyloidosis status post autologous stem cell transplant in 2018.  Recently saw Dr. Minda Ditto, hematologist and maintenance [place] for amyloidosis is on hold.  Pets: Has a dog and cat.  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  No asbestos, mold, hot tub, Jacuzzi, down pillows or comforters Smoking history: 30-45-pack-year smoker.  Quit in 1971 Travel history: Previously lived in [place].  No significant recent travel Relevant family history: No significant family history of lung disease  Outpatient Encounter Medications as of 01/10/2019  Medication Sig  . acyclovir (ZOVIRAX) 400 MG tablet Take 400 mg by mouth 2 (two)  times daily.  [name] Kitchen apixaban (ELIQUIS) 5 MG TABS tablet Take 1 tablet (5 mg total) by mouth 2 (two) times daily.  . Ascorbic Acid (VITAMIN C) 1000 MG tablet Take 1,000 mg by mouth 2 (two) times a day.   [name] Kitchen aspirin EC 81 MG tablet Take 81 mg by mouth daily.  [name] Kitchen atorvastatin (LIPITOR) 40 MG tablet Take 40 mg by mouth at bedtime.   . Cholecalciferol (D3-1000) 25 MCG (1000 UT) tablet Take 1,000 Units by mouth 2 (two) times a day.   . ezetimibe (ZETIA) 10 MG tablet Take 10 mg by mouth daily.   . folic acid (FOLVITE) 1 MG tablet Take 1 mg by mouth at bedtime.   . isosorbide mononitrate (IMDUR) 15 mg TB24 24 hr tablet Take 15 mg by mouth daily.  . Multiple Vitamin (MULTI-VITAMINS) TABS Take 1 tablet by mouth daily.  . mupirocin ointment (BACTROBAN) 2 % Apply 1 application topically as needed (rash).   . nitroGLYCERIN (NITROSTAT) 0.4 MG SL tablet Place 0.4 mg under the tongue every 5 (five) minutes as needed for chest pain.  [name] Kitchen ondansetron (ZOFRAN) 8 MG tablet Take 8 mg by mouth 2 (two) times daily as needed for nausea or vomiting.  . pantoprazole (PROTONIX) 40 MG tablet Take 80 mg by mouth daily.   [name] Kitchen triamcinolone cream (KENALOG) 0.1 % Apply 1 application topically as needed for rash.  . albuterol (VENTOLIN HFA) 108 (90 Base) MCG/ACT inhaler Inhale 2 puffs into the lungs every 4 (four) hours as needed for shortness of breath or wheezing.  . cetirizine (ZYRTEC) 10 MG tablet Take 10 mg by mouth daily. Takes as needed   No  facility-administered encounter medications on file as of 01/10/2019.     Allergies as of 01/10/2019 - Review Complete 01/10/2019  Allergen Reaction Noted  . Ramipril Cough     Past Medical History:  Diagnosis Date  . Amyloidosis ([place])   . Arthritis   . Basal cell carcinoma   . CAD (coronary artery disease)    Lexiscan Myoview 6/14: Normal study, no scar or ischemia, EF 62%  . Cataract   . Chronic kidney disease   . Colon polyp   . Coronary artery disease    moderate disease by  cath 2011  . Diverticulosis of colon   . GERD (gastroesophageal reflux disease)   . Hearing loss   . Heart disease   . Hiatal hernia   . Hyperlipidemia   . Hypertension   . MRSA (methicillin resistant staph aureus) culture positive   . Seizure disorder ([place])   . Seizures ([place])    last in 1970s    Past Surgical History:  Procedure Laterality Date  . BUNIONECTOMY    . CARPAL TUNNEL RELEASE  1994   left  . CERVICAL FUSION    . compression fracture    . FOOT SURGERY     right -twice, left foot once  . HERNIA REPAIR     Umbilical with PVP  . KNEE ARTHROSCOPY     left knee  . [place]   left - scope  . NASAL SEPTUM SURGERY    . NECK SURGERY    . NOSE SURGERY     twice  . PAROTIDECTOMY Right 03/09/2017  . ROTATOR CUFF REPAIR     twice, both shoulders  . ROTATOR CUFF REPAIR    . SHOULDER SURGERY    . SKIN BIOPSY    . SPINE SURGERY     C2, C3, C4  . TRIGGER FINGER RELEASE     both hands, twice  . TRIGGER FINGER RELEASE    . VASECTOMY      Family History  Problem Relation Age of Onset  . Diabetes Father   . Heart disease Father   . Heart attack Father 22  . Hypertension Father   . Cancer Sister   . Hearing loss Mother   . Eczema Sister   . Hyperlipidemia Other   . Diabetes Other   . Hypertension Other   . Seizures Other   . Thyroid disease Other   . Colon cancer Neg Hx   . Stroke Neg Hx     Social History   Socioeconomic History  . Marital status: Married    Spouse name: Not on file  . Number of children: 2  . Years of education: 65  . Highest education level: Not on file  Occupational History  . Occupation: Retired  Scientific laboratory technician  . Financial resource strain: Not on file  . Food insecurity    Worry: Not on file    Inability: Not on file  . Transportation needs    Medical: Not on file    Non-medical: Not on file  Tobacco Use  . Smoking status: Former Smoker    Packs/day: 3.00    Years: 20.00    Pack years: 60.00    Types:  Cigarettes    Quit date: 04/25/1969    Years since quitting: 49.7  . Smokeless tobacco: Former Network engineer and Sexual Activity  . Alcohol use: Yes    Alcohol/week: 7.0 - 14.0 standard drinks    Types: 7 - 14 Glasses  of wine per week    Comment: socially shots of wiskey  . Drug use: No  . Sexual activity: Not on file  Lifestyle  . Physical activity    Days per week: Not on file    Minutes per session: Not on file  . Stress: Not on file  Relationships  . Social Herbalist on phone: Not on file    Gets together: Not on file    Attends religious service: Not on file    Active member of club or organization: Not on file    Attends meetings of clubs or organizations: Not on file    Relationship status: Not on file  . Intimate partner violence    Fear of current or ex partner: Not on file    Emotionally abused: Not on file    Physically abused: Not on file    Forced sexual activity: Not on file  Other Topics Concern  . Not on file  Social History Narrative   ** Merged History Encounter **   Lives at home w/ his wife   Right-handed   Caffeine: occasional Pepsi        Review of systems: Review of Systems  Constitutional: Negative for fever and chills.  HENT: Negative.   Eyes: Negative for blurred vision.  Respiratory: as per HPI  Cardiovascular: Negative for chest pain and palpitations.  Gastrointestinal: Negative for vomiting, diarrhea, blood per rectum. Genitourinary: Negative for dysuria, urgency, frequency and hematuria.  Musculoskeletal: Negative for myalgias, back pain and joint pain.  Skin: Negative for itching and rash.  Neurological: Negative for dizziness, tremors, focal weakness, seizures and loss of consciousness.  Endo/Heme/Allergies: Negative for environmental allergies.  Psychiatric/Behavioral: Negative for depression, suicidal ideas and hallucinations.  All other systems reviewed and are negative.  Physical Exam: Blood pressure 120/66, pulse  85, temperature (!) 97.2 F (36.2 C), temperature source Temporal, height 5\' 11"  (1.803 m), weight 195 lb 6.4 oz (88.6 kg), SpO2 98 %. Gen:      No acute distress HEENT:  EOMI, sclera anicteric Neck:     No masses; no thyromegaly Lungs:    Bibasal crackles. CV:         Regular rate and rhythm; no murmurs Abd:      + bowel sounds; soft, non-tender; no palpable masses, no distension Ext:    No edema; adequate peripheral perfusion Skin:      Warm and dry; no rash Neuro: alert and oriented x 3 Psych: normal mood and affect  Data Reviewed: Imaging: CT chest 11/16/2016- peripheral GG opacities at the lung base.  Bilateral pulmonary nodules measuring 5 to 6 mm.   CT abdomen pelvis 12/28/2017- visualized lung bases appear clear. CTA 10/29/2018- no PE.  Multiple bilateral hazy pulmonary infiltrates CTA 11/01/2018- positive for PE.  Diffuse bilateral groundglass opacities. CT chest 11/08/2018 increasing groundglass opacities, consolidative changes.  Small pneumomediastinum.   Chest x-ray 12/19/2018 bilateral pulmonary infiltrates with slight improvement. I have reviewed the images personally.  Cardiac: Echocardiogram 11/08/2018-LVEF 60-65% mild to moderate AS.  Right ventricle is not well visualized right atrial pressure is estimated at 10 mm.  Assessment:  Post COVID-19 pneumonia Continues to be quite symptomatic with basal crackles on examination. Concerned that he may have post COVID pulmonary fibrosis We will schedule him for high-resolution CT, pulmonary function tests and 6-minute walk in next 2 to 4 weeks Continue supplemental oxygen.  Based on review and if there is progression of disease we may consider  anti-fibrotics We will give him a sample of Symbicort inhaler as I suspect he may have underlying COPD given his smoking history.  Plan/Recommendations: - High-res CT, PFTs, 6-minute walk test - Symbicort  [name] MD [facility] 01/10/2019, 10:13 AM  CC:  [name], MD

## 2019-01-10 NOTE — Patient Instructions (Addendum)
We will schedule you for high-resolution CT, pulmonary function test and a 6-minute walk test to be done in mid October Follow-up in clinic after this test  We will start you on Symbicort 160 twice daily We will send a prescription faxed to the [place] so that he can get this medication Follow-up in October.

## 2019-01-16 ENCOUNTER — Encounter: Payer: Self-pay | Admitting: Pulmonary Disease

## 2019-01-16 ENCOUNTER — Ambulatory Visit: Payer: Medicare HMO

## 2019-01-16 ENCOUNTER — Other Ambulatory Visit: Payer: Self-pay

## 2019-01-16 ENCOUNTER — Ambulatory Visit (INDEPENDENT_AMBULATORY_CARE_PROVIDER_SITE_OTHER): Payer: Medicare HMO | Admitting: Pulmonary Disease

## 2019-01-16 DIAGNOSIS — R0600 Dyspnea, unspecified: Secondary | ICD-10-CM

## 2019-01-16 NOTE — Progress Notes (Signed)
Virtual Visit via Telephone Note  I connected with Alex [name]. on 01/16/19 at 10:00 AM EDT by telephone and verified that I am speaking with the correct person using two identifiers.  Location: Patient: Home Provider: [facility], [place], [place]   I discussed the limitations, risks, security and privacy concerns of performing an evaluation and management service by telephone and the availability of in person appointments. I also discussed with the patient that there may be a patient responsible charge related to this service. The patient expressed understanding and agreed to proceed.   History of Present Illness: 78 year old with history of multiple myeloma, recent admission for COVID-19 pneumonia Ongoing evaluation for persistent dyspnea.  There is concern for post COVID pulmonary fibrosis He has high-resolution CT and pulmonary function test pending  He has requested a tele-visit today as he has symptoms of ongoing dyspnea and wants to know if the infection is coming back. On questioning he states that his breathing is mostly stable compared to last visit.  He still has dyspnea on exertion. Denies any fevers, chills.  His O2 sats are remaining in the 90s on supplemental oxygen   Observations/Objective: Persistent dyspnea after COVID-19 infection Concern for post COVID pulmonary fibrosis  Patient is anxious and concerned that he is not improving.  I suspect that he will have a prolonged recovery and I have tried to set his expectations regarding this. He is already finished physical therapy and is trying to stay active with exercises at home Continue supplemental oxygen We will await high-res CT and PFTs.  If there is significant pulmonary fibrosis then we can consider anti-fibrotics Get a chest x-ray today as he is concerned that he is getting reinfected.  Assessment and Plan: Chest x-ray High-res CT and pulmonary function tests as  scheduled  Follow Up Instructions: Follow-up in 2 to 4 weeks.   I discussed the assessment and treatment plan with the patient. The patient was provided an opportunity to ask questions and all were answered. The patient agreed with the plan and demonstrated an understanding of the instructions.   The patient was advised to call back or seek an in-person evaluation if the symptoms worsen or if the condition fails to improve as anticipated.  I provided 25 minutes of non-face-to-face time during this encounter.   [name] MD [facility] 01/16/2019, 10:20 AM

## 2019-01-16 NOTE — Patient Instructions (Signed)
I am sorry that your breathing is not improving I understand the concern that there may be a reinfection We will get a chest x-ray today to evaluate this

## 2019-01-17 ENCOUNTER — Other Ambulatory Visit: Payer: Self-pay | Admitting: *Deleted

## 2019-01-17 DIAGNOSIS — U071 COVID-19: Secondary | ICD-10-CM | POA: Diagnosis not present

## 2019-01-17 MED ORDER — ACYCLOVIR 400 MG PO TABS: 400.0000 mg | ORAL_TABLET | Freq: Two times a day (BID) | ORAL | 2 refills | Status: DC

## 2019-01-18 ENCOUNTER — Ambulatory Visit (INDEPENDENT_AMBULATORY_CARE_PROVIDER_SITE_OTHER): Payer: Medicare HMO

## 2019-01-18 ENCOUNTER — Telehealth: Payer: Self-pay | Admitting: *Deleted

## 2019-01-18 DIAGNOSIS — R0600 Dyspnea, unspecified: Secondary | ICD-10-CM | POA: Diagnosis not present

## 2019-01-18 DIAGNOSIS — R918 Other nonspecific abnormal finding of lung field: Secondary | ICD-10-CM | POA: Diagnosis not present

## 2019-01-18 DIAGNOSIS — U071 COVID-19: Secondary | ICD-10-CM | POA: Diagnosis not present

## 2019-01-18 NOTE — Telephone Encounter (Signed)
Faxed prescription for Acyclovir to [facility] [phone]. Fax confirmation received. Patient notified of same.

## 2019-01-21 ENCOUNTER — Telehealth: Payer: Self-pay | Admitting: Hematology

## 2019-01-21 NOTE — Telephone Encounter (Signed)
Scheduled appt per 9/24 sch message - pt aware of appt date and time

## 2019-01-22 ENCOUNTER — Telehealth: Payer: Self-pay | Admitting: Pulmonary Disease

## 2019-01-22 NOTE — Telephone Encounter (Signed)
Dr. Vaughan Browner please advise on chest xray results.

## 2019-01-23 ENCOUNTER — Telehealth: Payer: Self-pay | Admitting: Cardiovascular Disease

## 2019-01-23 NOTE — Telephone Encounter (Signed)
I spoke with pt and told him since he had a physical limitation his wife could accompany him to appointment.

## 2019-01-23 NOTE — Telephone Encounter (Signed)
I called and discussed with patient. Nothing further needed 

## 2019-01-23 NOTE — Telephone Encounter (Signed)
  Wife is calling because she needs to come to appt with her husband because he is in a wheelchair. Appt is on 01/28/19 with [facility]

## 2019-01-28 ENCOUNTER — Ambulatory Visit: Payer: Medicare HMO | Admitting: Cardiovascular Disease

## 2019-01-28 ENCOUNTER — Other Ambulatory Visit: Payer: Self-pay

## 2019-01-28 ENCOUNTER — Encounter: Payer: Self-pay | Admitting: Cardiovascular Disease

## 2019-01-28 VITALS — BP 120/60 | HR 90 | Ht 71.0 in | Wt 197.8 lb

## 2019-01-28 DIAGNOSIS — I10 Essential (primary) hypertension: Secondary | ICD-10-CM | POA: Diagnosis not present

## 2019-01-28 DIAGNOSIS — I25118 Atherosclerotic heart disease of native coronary artery with other forms of angina pectoris: Secondary | ICD-10-CM

## 2019-01-28 DIAGNOSIS — I350 Nonrheumatic aortic (valve) stenosis: Secondary | ICD-10-CM | POA: Diagnosis not present

## 2019-01-28 DIAGNOSIS — E7800 Pure hypercholesterolemia, unspecified: Secondary | ICD-10-CM | POA: Diagnosis not present

## 2019-01-28 NOTE — Progress Notes (Signed)
Chief Complaint  Patient presents with  . Follow-up    CAD   History of Present Illness: 78 yo male with history of amyloidosis with nephrotic syndrome, HTN, moderate non-obstructive CAD by cath 2011, aortic stenosis, GERD, hyperlipidemia and carotid artery disease who is here today for follow up. Carotid artery dopplers 04/05/12 with mild bilateral disease. Stress myoview July 2016 with no ischemia. Echo May 2018 with July 2016 with normal LV function, mild aortic stenosis. He was seen in our office March 2017 by Truitt Merle, NP and at that time noted occasional chest pain. Imdur was added. His chest pain improved. He was diagnosed with amyloidosis in April 2018 treated with chemotherapy for 8 months, then stem cell transplant December 2018 followed by chemotherapy.He is on maintenance chemotherapy. Echo September 2019 with LVEF=60-65%. Mild to moderate AS with mean gradient 19 mmHg, peak gradient 35 mmHg. He was admitted to [facility] July [id number] with complications from Covid 19. He developed respiratory failure and was treated with Remdesivir, steroids, Actemra and plasma. He was diagnosed with a pneumomediastinum. He was found to have a pulmonary embolism and was placed on Eliquis. Limited echo July 2020 with normal LV systolic function. No doppler assessment of the aortic valve.   He is here today for follow up. The patient denies any chest pain, palpitations, lower extremity edema, orthopnea, PND, dizziness, near syncope or syncope. His dyspnea is improving following his admission with pneumonia/covid 19. No longer having pleuritic chest pain. He is only wearing supplemental O2 at night.   Primary Care Physician: [name], MD  Past Medical History:  Diagnosis Date  . Amyloidosis ([place])   . Arthritis   . Basal cell carcinoma   . CAD (coronary artery disease)    Lexiscan Myoview 6/14: Normal study, no scar or ischemia, EF 62%  . Cataract   . Chronic kidney disease   . Colon polyp    . Coronary artery disease    moderate disease by cath 2011  . Diverticulosis of colon   . GERD (gastroesophageal reflux disease)   . Hearing loss   . Heart disease   . Hiatal hernia   . Hyperlipidemia   . Hypertension   . MRSA (methicillin resistant staph aureus) culture positive   . Seizure disorder ([place])   . Seizures ([place])    last in 1970s    Past Surgical History:  Procedure Laterality Date  . BUNIONECTOMY    . CARPAL TUNNEL RELEASE  1994   left  . CERVICAL FUSION    . compression fracture    . FOOT SURGERY     right -twice, left foot once  . HERNIA REPAIR     Umbilical with PVP  . KNEE ARTHROSCOPY     left knee  . [place]   left - scope  . NASAL SEPTUM SURGERY    . NECK SURGERY    . NOSE SURGERY     twice  . PAROTIDECTOMY Right 03/09/2017  . ROTATOR CUFF REPAIR     twice, both shoulders  . ROTATOR CUFF REPAIR    . SHOULDER SURGERY    . SKIN BIOPSY    . SPINE SURGERY     C2, C3, C4  . TRIGGER FINGER RELEASE     both hands, twice  . TRIGGER FINGER RELEASE    . VASECTOMY      Current Outpatient Medications  Medication Sig Dispense Refill  . acyclovir (ZOVIRAX) 400 MG tablet Take 1  tablet (400 mg total) by mouth 2 (two) times daily. 60 tablet 2  . albuterol (VENTOLIN HFA) 108 (90 Base) MCG/ACT inhaler Inhale 2 puffs into the lungs every 4 (four) hours as needed for shortness of breath or wheezing.    [name] Kitchen apixaban (ELIQUIS) 5 MG TABS tablet Take 1 tablet (5 mg total) by mouth 2 (two) times daily. 60 tablet 1  . Ascorbic Acid (VITAMIN C) 1000 MG tablet Take 1,000 mg by mouth 2 (two) times a day.     [name] Kitchen aspirin EC 81 MG tablet Take 81 mg by mouth daily.    [name] Kitchen atorvastatin (LIPITOR) 40 MG tablet Take 40 mg by mouth at bedtime.     . budesonide-formoterol (SYMBICORT) 160-4.5 MCG/ACT inhaler Inhale 2 puffs into the lungs 2 (two) times daily. 1 Inhaler 0  . cetirizine (ZYRTEC) 10 MG tablet Take 10 mg by mouth daily. Takes as needed    . Cholecalciferol  (D3-1000) 25 MCG (1000 UT) tablet Take 1,000 Units by mouth 2 (two) times a day.     . ezetimibe (ZETIA) 10 MG tablet Take 10 mg by mouth daily.     . folic acid (FOLVITE) 1 MG tablet Take 1 mg by mouth at bedtime.     . isosorbide mononitrate (IMDUR) 15 mg TB24 24 hr tablet Take 15 mg by mouth daily.    . Multiple Vitamin (MULTI-VITAMINS) TABS Take 1 tablet by mouth daily.    . mupirocin ointment (BACTROBAN) 2 % Apply 1 application topically as needed (rash).     . nitroGLYCERIN (NITROSTAT) 0.4 MG SL tablet Place 0.4 mg under the tongue every 5 (five) minutes as needed for chest pain.    [name] Kitchen ondansetron (ZOFRAN) 8 MG tablet Take 8 mg by mouth 2 (two) times daily as needed for nausea or vomiting.    . pantoprazole (PROTONIX) 40 MG tablet Take 80 mg by mouth daily.     [name] Kitchen triamcinolone cream (KENALOG) 0.1 % Apply 1 application topically as needed for rash.     No current facility-administered medications for this visit.     Allergies  Allergen Reactions  . Ramipril Cough    Social History   Socioeconomic History  . Marital status: Married    Spouse name: Not on file  . Number of children: 2  . Years of education: 74  . Highest education level: Not on file  Occupational History  . Occupation: Retired  Scientific laboratory technician  . Financial resource strain: Not on file  . Food insecurity    Worry: Not on file    Inability: Not on file  . Transportation needs    Medical: Not on file    Non-medical: Not on file  Tobacco Use  . Smoking status: Former Smoker    Packs/day: 3.00    Years: 20.00    Pack years: 60.00    Types: Cigarettes    Quit date: 04/25/1969    Years since quitting: 49.7  . Smokeless tobacco: Former Network engineer and Sexual Activity  . Alcohol use: Yes    Alcohol/week: 7.0 - 14.0 standard drinks    Types: 7 - 14 Glasses of wine per week    Comment: socially shots of wiskey  . Drug use: No  . Sexual activity: Not on file  Lifestyle  . Physical activity    Days per  week: Not on file    Minutes per session: Not on file  . Stress: Not on file  Relationships  . Social  connections    Talks on phone: Not on file    Gets together: Not on file    Attends religious service: Not on file    Active member of club or organization: Not on file    Attends meetings of clubs or organizations: Not on file    Relationship status: Not on file  . Intimate partner violence    Fear of current or ex partner: Not on file    Emotionally abused: Not on file    Physically abused: Not on file    Forced sexual activity: Not on file  Other Topics Concern  . Not on file  Social History Narrative   ** Merged History Encounter **   Lives at home w/ his wife   Right-handed   Caffeine: occasional Pepsi        Family History  Problem Relation Age of Onset  . Diabetes Father   . Heart disease Father   . Heart attack Father 29  . Hypertension Father   . Cancer Sister   . Hearing loss Mother   . Eczema Sister   . Hyperlipidemia Other   . Diabetes Other   . Hypertension Other   . Seizures Other   . Thyroid disease Other   . Colon cancer Neg Hx   . Stroke Neg Hx     Review of Systems:  As stated in the HPI and otherwise negative.   BP 120/60   Pulse 90   Ht 5\' 11"  (1.803 m)   Wt 197 lb 12.8 oz (89.7 kg)   SpO2 97%   BMI 27.59 kg/m   Physical Examination:  General: Well developed, well nourished, NAD  HEENT: OP clear, mucus membranes moist  SKIN: warm, dry. No rashes. Neuro: No focal deficits  Musculoskeletal: Muscle strength 5/5 all ext  Psychiatric: Mood and affect normal  Neck: No JVD, no carotid bruits, no thyromegaly, no lymphadenopathy.  Lungs:Clear bilaterally, no wheezes, rhonci, crackles Cardiovascular: Regular rate and rhythm. Systolic murmur noted.  Abdomen:Soft. Bowel sounds present. Non-tender.  Extremities: No lower extremity edema. Pulses are 2 + in the bilateral DP/PT.  Echo September 2019: - Left ventricle: The cavity size was  normal. There was moderate   concentric hypertrophy. Systolic function was normal. The   estimated ejection fraction was in the range of 60% to 65%. Wall   motion was normal; there were no regional wall motion   abnormalities. Doppler parameters are consistent with abnormal   left ventricular relaxation (grade 1 diastolic dysfunction).   There was no evidence of elevated ventricular filling pressure by   Doppler parameters. - Aortic valve: Trileaflet; severely thickened, severely calcified   leaflets. There was mild to moderate stenosis. There was mild   regurgitation. Mean gradient (S): 19 mm Hg. Peak gradient (S): 35   mm Hg. - Left atrium: The atrium was mildly dilated. - Right ventricle: The cavity size was normal. Wall thickness was   normal. Systolic function was normal.  EKG:  EKG is not ordered today. The ekg ordered today demonstrates   Recent Labs: 04/22/2018: B Natriuretic Peptide 37.7 11/14/2018: Magnesium 2.2 01/01/2019: ALT 18; BUN 17; Creatinine 1.23; Hemoglobin 12.8; Platelets 149; Potassium 3.8; Sodium 139   Lipid Panel Followed in primary care   Wt Readings from Last 3 Encounters:  01/28/19 197 lb 12.8 oz (89.7 kg)  01/10/19 195 lb 6.4 oz (88.6 kg)  01/01/19 189 lb 14.4 oz (86.1 kg)     Other studies Reviewed: Additional studies/  records that were reviewed today include: . Review of the above records demonstrates:    Assessment and Plan:   1. CAD with stable angina: He is known to have moderate CAD by cath in 2011. No change in chronic chest pain. Continue ASA, statin and Imdur.   2. HYPERTENSION: BP is controlled.      3. HYPERLIPIDEMIA: Lipids followed in primary care. Will continue statin  4. Aortic stenosis: Mild to moderate AS by echo in September 2019. Will plan repeat echo March 2021. (no assessment of the aortic valve on the limited echo done July 2020.    5. Amyloidosis: He is being followed in oncology. He has had chemotherapy and a stem cell  transplant.   6. Pulmonary embolism: He is on Eliquis.  Follow up in primary care.   Current medicines are reviewed at length with the patient today.  The patient does not have concerns regarding medicines.  The following changes have been made:  no change  Labs/ tests ordered today include:   Orders Placed This Encounter  Procedures  . ECHOCARDIOGRAM COMPLETE    Disposition:   FU with me in 6 months.   Signed, [name], MD 01/28/2019 9:55 AM    [facility] [place], [place], [place]  [address] Phone: [phone]; Fax: [phone]

## 2019-01-28 NOTE — Patient Instructions (Signed)
Medication Instructions:  No changes If you need a refill on your cardiac medications before your next appointment, please call your pharmacy.   Lab work: none If you have labs (blood work) drawn today and your tests are completely normal, you will receive your results only by: [name] Kitchen MyChart Message (if you have MyChart) OR . A paper copy in the mail If you have any lab test that is abnormal or we need to change your treatment, we will call you to review the results.  Testing/Procedures: Your physician has requested that you have an echocardiogram. Echocardiography is a painless test that uses sound waves to create images of your heart. It provides your doctor with information about the size and shape of your heart and how well your heart's chambers and valves are working. This procedure takes approximately one hour. There are no restrictions for this procedure.   Follow-Up: At [facility], you and your health needs are our priority.  As part of our continuing mission to provide you with exceptional heart care, we have created designated Provider Care Teams.  These Care Teams include your primary Cardiologist (physician) and Advanced Practice Providers (APPs -  Physician Assistants and Nurse Practitioners) who all work together to provide you with the care you need, when you need it. You will need a follow up appointment in 6 months.  Please call our office 2 months in advance to schedule this appointment.  You may see [name], MD or one of the following Advanced Practice Providers on your designated Care Team:   [place], [name] Melina Copa, [name] . Ermalinda Barrios, [name]  Any Other Special Instructions Will Be Listed Below (If Applicable).

## 2019-02-04 ENCOUNTER — Other Ambulatory Visit: Payer: Self-pay

## 2019-02-04 ENCOUNTER — Ambulatory Visit (INDEPENDENT_AMBULATORY_CARE_PROVIDER_SITE_OTHER)
Admission: RE | Admit: 2019-02-04 | Discharge: 2019-02-04 | Disposition: A | Discharge status: Home / Self Care | Payer: Medicare HMO | Source: Ambulatory Visit | Attending: Pulmonary Disease | Admitting: Pulmonary Disease

## 2019-02-04 DIAGNOSIS — R0600 Dyspnea, unspecified: Secondary | ICD-10-CM

## 2019-02-04 DIAGNOSIS — R0602 Shortness of breath: Secondary | ICD-10-CM | POA: Diagnosis not present

## 2019-02-05 ENCOUNTER — Ambulatory Visit (INDEPENDENT_AMBULATORY_CARE_PROVIDER_SITE_OTHER): Payer: Medicare HMO | Admitting: Pulmonary Disease

## 2019-02-05 ENCOUNTER — Encounter: Payer: Self-pay | Admitting: Pulmonary Disease

## 2019-02-05 ENCOUNTER — Ambulatory Visit (INDEPENDENT_AMBULATORY_CARE_PROVIDER_SITE_OTHER): Payer: Medicare HMO

## 2019-02-05 DIAGNOSIS — R0602 Shortness of breath: Secondary | ICD-10-CM | POA: Diagnosis not present

## 2019-02-05 NOTE — Patient Instructions (Signed)
We will make sure that your pulmonary function test and 6-minute walk test are scheduled  We will order a follow-up high-resolution CT in 3 months with spirometry diffusion capacity and 6-minute walk test in 3 months Follow-up after these tests

## 2019-02-05 NOTE — Progress Notes (Signed)
Alex [name].    [id number]    [birth year]  Primary Care Physician:Alex [name], Alex Saxon, MD  Referring Physician: [name], MD [address] [place],  [place] [address]  Chief complaint: Consult for dyspnea, post COVID-19 pneumonia  HPI: [name] is here for evaluation of dyspnea  He has past medical history of AL amyloidosis with nephrotic syndrome, hypertension, moderate nonobstructive CAD by cath 2011, aortic stenosis, GERD, hyperlipidemia, carotid artery disease.  Hospitalized from July 5 to July 25 at [facility] for COVID-19 pneumonia. He has received treatment with Remdesivir 7/6-7/10, steroids for 10 days up until 7/15, received Actemra on 7/11 and plasma on 7/10.  He also received hydroxychloroquine per his request after discussion of risks and benefits. Hospital course complicated with subsegmental pulmonary embolism and he is on anticoagulation,  pneumomediastinum that was treated conservatively.  Maintained on high flow nasal cannula.  Did not need to be intubated He has been discharged on supplemental oxygen.  Continues to have significant dyspnea on exertion at home.  He is working with physical therapy and trying to stay active at home.  History notable for amyloidosis status post autologous stem cell transplant in 2018.  Recently saw Dr. Minda [name], hematologist and maintenance [place] for amyloidosis is on hold.  Pets: Has a dog and cat.  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  No asbestos, mold, hot tub, Jacuzzi, down pillows or comforters Smoking history: 30-45-pack-year smoker.  Quit in 1971 Travel history: Previously lived in [place].  No significant recent travel Relevant family history: No significant family history of lung disease  Outpatient Encounter Medications as of 02/05/2019  Medication Sig  . acyclovir (ZOVIRAX) 400 MG tablet Take 1 tablet (400 mg total)  by mouth 2 (two) times daily.  [name] Kitchen albuterol (VENTOLIN HFA) 108 (90 Base) MCG/ACT inhaler Inhale 2 puffs into the lungs every 4 (four) hours as needed for shortness of breath or wheezing.  [name] Kitchen apixaban (ELIQUIS) 5 MG TABS tablet Take 1 tablet (5 mg total) by mouth 2 (two) times daily.  . Ascorbic Acid (VITAMIN C) 1000 MG tablet Take 1,000 mg by mouth 2 (two) times a day.   [name] Kitchen aspirin EC 81 MG tablet Take 81 mg by mouth daily.  [name] Kitchen atorvastatin (LIPITOR) 40 MG tablet Take 40 mg by mouth at bedtime.   . budesonide-formoterol (SYMBICORT) 160-4.5 MCG/ACT inhaler Inhale 2 puffs into the lungs 2 (two) times daily.  . cetirizine (ZYRTEC) 10 MG tablet Take 10 mg by mouth daily. Takes as needed  . Cholecalciferol (D3-1000) 25 MCG (1000 UT) tablet Take 1,000 Units by mouth 2 (two) times a day.   . ezetimibe (ZETIA) 10 MG tablet Take 10 mg by mouth daily.   . folic acid (FOLVITE) 1 MG tablet Take 1 mg by mouth at bedtime.   . isosorbide mononitrate (IMDUR) 15 mg TB24 24 hr tablet Take 15 mg by mouth daily.  . Multiple Vitamin (MULTI-VITAMINS) TABS Take 1 tablet by mouth daily.  . mupirocin ointment (BACTROBAN) 2 % Apply 1 application topically as needed (rash).   . nitroGLYCERIN (NITROSTAT) 0.4 MG SL tablet Place 0.4 mg under the tongue every 5 (five) minutes as needed for chest pain.  [name] Kitchen ondansetron (ZOFRAN) 8 MG tablet Take 8 mg by mouth 2 (two) times daily as needed for nausea or vomiting.  . pantoprazole (PROTONIX) 40 MG tablet Take 80 mg by mouth  daily.   . triamcinolone cream (KENALOG) 0.1 % Apply 1 application topically as needed for rash.   No facility-administered encounter medications on file as of 02/05/2019.     Allergies as of 01/10/2019 - Review Complete 01/10/2019  Allergen Reaction Noted  . Ramipril Cough     Past Medical History:  Diagnosis Date  . Amyloidosis ([place])   . Arthritis   . Basal cell carcinoma   . CAD (coronary artery disease)    Lexiscan Myoview 6/14: Normal study, no scar  or ischemia, EF 62%  . Cataract   . Chronic kidney disease   . Colon polyp   . Coronary artery disease    moderate disease by cath 2011  . Diverticulosis of colon   . GERD (gastroesophageal reflux disease)   . Hearing loss   . Heart disease   . Hiatal hernia   . Hyperlipidemia   . Hypertension   . MRSA (methicillin resistant staph aureus) culture positive   . Seizure disorder ([place])   . Seizures ([place])    last in 1970s    Past Surgical History:  Procedure Laterality Date  . BUNIONECTOMY    . CARPAL TUNNEL RELEASE  1994   left  . CERVICAL FUSION    . compression fracture    . FOOT SURGERY     right -twice, left foot once  . HERNIA REPAIR     Umbilical with PVP  . KNEE ARTHROSCOPY     left knee  . [place]   left - scope  . NASAL SEPTUM SURGERY    . NECK SURGERY    . NOSE SURGERY     twice  . PAROTIDECTOMY Right 03/09/2017  . ROTATOR CUFF REPAIR     twice, both shoulders  . ROTATOR CUFF REPAIR    . SHOULDER SURGERY    . SKIN BIOPSY    . SPINE SURGERY     C2, C3, C4  . TRIGGER FINGER RELEASE     both hands, twice  . TRIGGER FINGER RELEASE    . VASECTOMY      Family History  Problem Relation Age of Onset  . Diabetes Father   . Heart disease Father   . Heart attack Father 17  . Hypertension Father   . Cancer Sister   . Hearing loss Mother   . Eczema Sister   . Hyperlipidemia Other   . Diabetes Other   . Hypertension Other   . Seizures Other   . Thyroid disease Other   . Colon cancer Neg Hx   . Stroke Neg Hx     Social History   Socioeconomic History  . Marital status: Married    Spouse name: Not on file  . Number of children: 2  . Years of education: 110  . Highest education level: Not on file  Occupational History  . Occupation: Retired  Scientific laboratory technician  . Financial resource strain: Not on file  . Food insecurity    Worry: Not on file    Inability: Not on file  . Transportation needs    Medical: Not on file    Non-medical: Not on  file  Tobacco Use  . Smoking status: Former Smoker    Packs/day: 3.00    Years: 20.00    Pack years: 60.00    Types: Cigarettes    Quit date: 04/25/1969    Years since quitting: 49.7  . Smokeless tobacco: Former Network engineer and Sexual Activity  .  Alcohol use: Yes    Alcohol/week: 7.0 - 14.0 standard drinks    Types: 7 - 14 Glasses of wine per week    Comment: socially shots of wiskey  . Drug use: No  . Sexual activity: Not on file  Lifestyle  . Physical activity    Days per week: Not on file    Minutes per session: Not on file  . Stress: Not on file  Relationships  . Social Herbalist on phone: Not on file    Gets together: Not on file    Attends religious service: Not on file    Active member of club or organization: Not on file    Attends meetings of clubs or organizations: Not on file    Relationship status: Not on file  . Intimate partner violence    Fear of current or ex partner: Not on file    Emotionally abused: Not on file    Physically abused: Not on file    Forced sexual activity: Not on file  Other Topics Concern  . Not on file  Social History Narrative   ** Merged History Encounter **   Lives at home w/ his wife   Right-handed   Caffeine: occasional Pepsi        Review of systems: Review of Systems  Constitutional: Negative for fever and chills.  HENT: Negative.   Eyes: Negative for blurred vision.  Respiratory: as per HPI  Cardiovascular: Negative for chest pain and palpitations.  Gastrointestinal: Negative for vomiting, diarrhea, blood per rectum. Genitourinary: Negative for dysuria, urgency, frequency and hematuria.  Musculoskeletal: Negative for myalgias, back pain and joint pain.  Skin: Negative for itching and rash.  Neurological: Negative for dizziness, tremors, focal weakness, seizures and loss of consciousness.  Endo/Heme/Allergies: Negative for environmental allergies.  Psychiatric/Behavioral: Negative for depression,  suicidal ideas and hallucinations.  All other systems reviewed and are negative.  Physical Exam: Blood pressure 120/66, pulse 85, temperature (!) 97.2 F (36.2 C), temperature source Temporal, height 5\' 11"  (1.803 m), weight 195 lb 6.4 oz (88.6 kg), SpO2 98 %. Gen:      No acute distress HEENT:  EOMI, sclera anicteric Neck:     No masses; no thyromegaly Lungs:    Bibasal crackles. CV:         Regular rate and rhythm; no murmurs Abd:      + bowel sounds; soft, non-tender; no palpable masses, no distension Ext:    No edema; adequate peripheral perfusion Skin:      Warm and dry; no rash Neuro: alert and oriented x 3 Psych: normal mood and affect  Data Reviewed: Imaging: CT chest 11/16/2016- peripheral GG opacities at the lung base.  Bilateral pulmonary nodules measuring 5 to 6 mm.   CT abdomen pelvis 12/28/2017- visualized lung bases appear clear. CTA 10/29/2018- no PE.  Multiple bilateral hazy pulmonary infiltrates CTA 11/01/2018- positive for PE.  Diffuse bilateral groundglass opacities. CT chest 11/08/2018 increasing groundglass opacities, consolidative changes.  Small pneumomediastinum.   Chest x-ray 12/19/2018 bilateral pulmonary infiltrates with slight improvement. I have reviewed the images personally.  Cardiac: Echocardiogram 11/08/2018-LVEF 60-65% mild to moderate AS.  Right ventricle is not well visualized right atrial pressure is estimated at 10 mm.  Assessment:  Post COVID-19 pneumonia Continues to be quite symptomatic with basal crackles on examination. Concerned that he may have post COVID pulmonary fibrosis We will schedule him for high-resolution CT, pulmonary function tests and 6-minute walk in next 2  to 4 weeks Continue supplemental oxygen.  Based on review and if there is progression of disease we may consider anti-fibrotics We will give him a sample of Symbicort inhaler as I suspect he may have underlying COPD given his smoking history.  Plan/Recommendations: -  High-res CT, PFTs, 6-minute walk test - Symbicort  [name] MD [facility] 02/05/2019, 11:26 AM  CC: Alex Redwood, MD

## 2019-02-05 NOTE — Progress Notes (Signed)
Alex [name].    [id number]    01-Oct-1940  Primary Care Physician:Shaw, Gwyndolyn Saxon, MD  Referring Physician: [name], MD [address] [place],  [place] [address]  Chief complaint: Follow up for pulmonary fibrosis, post COVID-19 pneumonia  HPI: Alex [name] is here for evaluation of dyspnea  He has past medical history of AL amyloidosis with nephrotic syndrome, hypertension, moderate nonobstructive CAD by cath 2011, aortic stenosis, GERD, hyperlipidemia, carotid artery disease.  Hospitalized from July 5 to July 25 at [facility] for COVID-19 pneumonia. He has received treatment with Remdesivir 7/6-7/10, steroids for 10 days up until 7/15, received Actemra on 7/11 and plasma on 7/10.  He also received hydroxychloroquine per his request after discussion of risks and benefits. Hospital course complicated with subsegmental pulmonary embolism and he is on anticoagulation,  pneumomediastinum that was treated conservatively.  Maintained on high flow nasal cannula.  Did not need to be intubated He has been discharged on supplemental oxygen.  Continues to have significant dyspnea on exertion at home.  He is working with physical therapy and trying to stay active at home.  History notable for amyloidosis status post autologous stem cell transplant in 2018.  Recently saw Dr. Minda Ditto, hematologist and maintenance [place] for amyloidosis is on hold.  Pets: Has a dog and cat.  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  No asbestos, mold, hot tub, Jacuzzi, down pillows or comforters Smoking history: 30-45-pack-year smoker.  Quit in 1971 Travel history: Previously lived in [place].  No significant recent travel Relevant family history: No significant family history of lung disease  Interim history: Continues to have dyspnea on exertion which is stable to slightly improved compared to last visit..   Continues on supplemental oxygen Here for review of CT.  Outpatient Encounter Medications as of 02/05/2019  Medication Sig  . acyclovir (ZOVIRAX) 400 MG tablet Take 1 tablet (400 mg total) by mouth 2 (two) times daily.  [name] Kitchen albuterol (VENTOLIN HFA) 108 (90 Base) MCG/ACT inhaler Inhale 2 puffs into the lungs every 4 (four) hours as needed for shortness of breath or wheezing.  [name] Kitchen apixaban (ELIQUIS) 5 MG TABS tablet Take 1 tablet (5 mg total) by mouth 2 (two) times daily.  . Ascorbic Acid (VITAMIN C) 1000 MG tablet Take 1,000 mg by mouth 2 (two) times a day.   [name] Kitchen aspirin EC 81 MG tablet Take 81 mg by mouth daily.  [name] Kitchen atorvastatin (LIPITOR) 40 MG tablet Take 40 mg by mouth at bedtime.   . budesonide-formoterol (SYMBICORT) 160-4.5 MCG/ACT inhaler Inhale 2 puffs into the lungs 2 (two) times daily.  . cetirizine (ZYRTEC) 10 MG tablet Take 10 mg by mouth daily. Takes as needed  . Cholecalciferol (D3-1000) 25 MCG (1000 UT) tablet Take 1,000 Units by mouth 2 (two) times a day.   . ezetimibe (ZETIA) 10 MG tablet Take 10 mg by mouth daily.   . folic acid (FOLVITE) 1 MG tablet Take 1 mg by mouth at bedtime.   . isosorbide mononitrate (IMDUR) 15 mg TB24 24 hr tablet Take 15 mg by mouth daily.  . Multiple Vitamin (MULTI-VITAMINS) TABS Take 1 tablet by mouth daily.  . mupirocin ointment (BACTROBAN) 2 % Apply 1 application topically as needed (rash).   . nitroGLYCERIN (NITROSTAT) 0.4 MG SL tablet Place 0.4 mg under the tongue every 5 (five) minutes as needed for chest pain.  [name] Kitchen ondansetron (  ZOFRAN) 8 MG tablet Take 8 mg by mouth 2 (two) times daily as needed for nausea or vomiting.  . pantoprazole (PROTONIX) 40 MG tablet Take 80 mg by mouth daily.   [name] Kitchen triamcinolone cream (KENALOG) 0.1 % Apply 1 application topically as needed for rash.   No facility-administered encounter medications on file as of 02/05/2019.     Allergies as of 01/10/2019 - Review Complete 01/10/2019  Allergen Reaction Noted  . Ramipril Cough     Physical Exam: Blood pressure 118/70, pulse 85, temperature 97.8 F (36.6 C), temperature source Temporal, height 5\' 11"  (1.803 m), weight 197 lb (89.4 kg), SpO2 98 %. Gen:      No acute distress HEENT:  EOMI, sclera anicteric Neck:     No masses; no thyromegaly Lungs:    Bilateral crackles CV:         Regular rate and rhythm; no murmurs Abd:      + bowel sounds; soft, non-tender; no palpable masses, no distension Ext:    No edema; adequate peripheral perfusion Skin:      Warm and dry; no rash Neuro: alert and oriented x 3 Psych: normal mood and affect  Data Reviewed: Imaging: CT chest 11/16/2016- peripheral GG opacities at the lung base.  Bilateral pulmonary nodules measuring 5 to 6 mm.   CT abdomen pelvis 12/28/2017- visualized lung bases appear clear. CTA 10/29/2018- no PE.  Multiple bilateral hazy pulmonary infiltrates CTA 11/01/2018- positive for PE.  Diffuse bilateral groundglass opacities. CT chest 11/08/2018 increasing groundglass opacities, consolidative changes.  Small pneumomediastinum.   High-resolution CT 02/04/2019- basilar predominant pulmonary fibrosis with traction bronchiectasis.  No honeycombing.  I have reviewed the images personally.  Cardiac: Echocardiogram 11/08/2018-LVEF 60-65% mild to moderate AS.  Right ventricle is not well visualized right atrial pressure is estimated at 10 mm.  Assessment:  Post COVID-19 pulmonary fibrosis Continues to be quite symptomatic with basal crackles on examination. CT reviewed with basilar predominant pulmonary fibrosis and probable UIP pattern  Had an extensive discussion with patient and wife in clinic today.  Told him that this is a new disease and we are not clear on the optimal treatment for this. Based on previous experience with post ARDS pulmonary fibrosis this will likely improve over time however post COVID-19 pulmonary fibrosis may have a different trajectory.  I suspect that he will have a prolonged recovery and I have  tried to set his expectations regarding this.  He has finished pulmonary rehab and continues to be active with regular exercise at home. We are awaiting pulmonary function test and 6-minute walk test.  Continue close monitoring.  If there is progression then consider anti-fibrotics. Continue supplemental oxygen.  Continue Symbicort inhaler as I suspect he may have underlying COPD given his smoking history.  Plan/Recommendations: - PFTs, 6-minute walk test - Symbicort  [name] MD [facility] 02/05/2019, 11:42 AM  CC: [name], MD

## 2019-02-05 NOTE — Addendum Note (Signed)
Addended by: Annie Paras D on: 02/05/2019 03:06 PM   Modules accepted: Orders

## 2019-02-11 ENCOUNTER — Inpatient Hospital Stay: Admission: RE | Admit: 2019-02-11 | Payer: Medicare HMO | Source: Ambulatory Visit

## 2019-02-13 DIAGNOSIS — R69 Illness, unspecified: Secondary | ICD-10-CM | POA: Diagnosis not present

## 2019-02-16 DIAGNOSIS — U071 COVID-19: Secondary | ICD-10-CM | POA: Diagnosis not present

## 2019-02-25 DIAGNOSIS — J8410 Pulmonary fibrosis, unspecified: Secondary | ICD-10-CM | POA: Diagnosis not present

## 2019-02-25 DIAGNOSIS — Z7901 Long term (current) use of anticoagulants: Secondary | ICD-10-CM | POA: Diagnosis not present

## 2019-02-25 DIAGNOSIS — R972 Elevated prostate specific antigen [PSA]: Secondary | ICD-10-CM | POA: Diagnosis not present

## 2019-02-25 DIAGNOSIS — E8581 Light chain (AL) amyloidosis: Secondary | ICD-10-CM | POA: Diagnosis not present

## 2019-02-25 DIAGNOSIS — I2699 Other pulmonary embolism without acute cor pulmonale: Secondary | ICD-10-CM | POA: Diagnosis not present

## 2019-02-25 DIAGNOSIS — R7301 Impaired fasting glucose: Secondary | ICD-10-CM | POA: Diagnosis not present

## 2019-02-25 DIAGNOSIS — J9611 Chronic respiratory failure with hypoxia: Secondary | ICD-10-CM | POA: Diagnosis not present

## 2019-03-04 ENCOUNTER — Inpatient Hospital Stay: Payer: Medicare HMO

## 2019-03-04 ENCOUNTER — Inpatient Hospital Stay: Payer: Medicare HMO | Attending: Hematology | Admitting: Hematology

## 2019-03-04 ENCOUNTER — Other Ambulatory Visit: Payer: Self-pay

## 2019-03-04 VITALS — BP 122/70 | HR 76 | Temp 98.2°F | Resp 18 | Ht 70.0 in | Wt 200.1 lb

## 2019-03-04 DIAGNOSIS — E785 Hyperlipidemia, unspecified: Secondary | ICD-10-CM | POA: Diagnosis not present

## 2019-03-04 DIAGNOSIS — E8581 Light chain (AL) amyloidosis: Secondary | ICD-10-CM | POA: Diagnosis not present

## 2019-03-04 DIAGNOSIS — I10 Essential (primary) hypertension: Secondary | ICD-10-CM | POA: Insufficient documentation

## 2019-03-04 DIAGNOSIS — N189 Chronic kidney disease, unspecified: Secondary | ICD-10-CM | POA: Insufficient documentation

## 2019-03-04 DIAGNOSIS — Z87891 Personal history of nicotine dependence: Secondary | ICD-10-CM | POA: Diagnosis not present

## 2019-03-04 DIAGNOSIS — G4733 Obstructive sleep apnea (adult) (pediatric): Secondary | ICD-10-CM | POA: Insufficient documentation

## 2019-03-04 DIAGNOSIS — G629 Polyneuropathy, unspecified: Secondary | ICD-10-CM | POA: Insufficient documentation

## 2019-03-04 DIAGNOSIS — Z79899 Other long term (current) drug therapy: Secondary | ICD-10-CM | POA: Insufficient documentation

## 2019-03-04 DIAGNOSIS — Z7901 Long term (current) use of anticoagulants: Secondary | ICD-10-CM | POA: Insufficient documentation

## 2019-03-04 DIAGNOSIS — G40909 Epilepsy, unspecified, not intractable, without status epilepticus: Secondary | ICD-10-CM | POA: Diagnosis not present

## 2019-03-04 DIAGNOSIS — Z7982 Long term (current) use of aspirin: Secondary | ICD-10-CM | POA: Diagnosis not present

## 2019-03-04 DIAGNOSIS — Z9981 Dependence on supplemental oxygen: Secondary | ICD-10-CM | POA: Diagnosis not present

## 2019-03-04 DIAGNOSIS — Z85828 Personal history of other malignant neoplasm of skin: Secondary | ICD-10-CM | POA: Diagnosis not present

## 2019-03-04 DIAGNOSIS — Z8619 Personal history of other infectious and parasitic diseases: Secondary | ICD-10-CM | POA: Diagnosis not present

## 2019-03-04 DIAGNOSIS — N049 Nephrotic syndrome with unspecified morphologic changes: Secondary | ICD-10-CM | POA: Diagnosis not present

## 2019-03-04 LAB — CBC WITH DIFFERENTIAL/PLATELET
Abs Immature Granulocytes: 0.02 10*3/uL (ref 0.00–0.07)
Basophils Absolute: 0 10*3/uL (ref 0.0–0.1)
Basophils Relative: 0 %
Eosinophils Absolute: 0.2 10*3/uL (ref 0.0–0.5)
Eosinophils Relative: 2 %
HCT: 37.9 % — ABNORMAL LOW (ref 39.0–52.0)
Hemoglobin: 12.1 g/dL — ABNORMAL LOW (ref 13.0–17.0)
Immature Granulocytes: 0 %
Lymphocytes Relative: 22 %
Lymphs Abs: 1.6 10*3/uL (ref 0.7–4.0)
MCH: 28.5 pg (ref 26.0–34.0)
MCHC: 31.9 g/dL (ref 30.0–36.0)
MCV: 89.2 fL (ref 80.0–100.0)
Monocytes Absolute: 0.5 10*3/uL (ref 0.1–1.0)
Monocytes Relative: 7 %
Neutro Abs: 4.9 10*3/uL (ref 1.7–7.7)
Neutrophils Relative %: 69 %
Platelets: 162 10*3/uL (ref 150–400)
RBC: 4.25 MIL/uL (ref 4.22–5.81)
RDW: 13.8 % (ref 11.5–15.5)
WBC: 7.2 10*3/uL (ref 4.0–10.5)
nRBC: 0 % (ref 0.0–0.2)

## 2019-03-04 LAB — CMP (CANCER CENTER ONLY)
ALT: 17 U/L (ref 0–44)
AST: 20 U/L (ref 15–41)
Albumin: 4 g/dL (ref 3.5–5.0)
Alkaline Phosphatase: 74 U/L (ref 38–126)
Anion gap: 11 (ref 5–15)
BUN: 17 mg/dL (ref 8–23)
CO2: 25 mmol/L (ref 22–32)
Calcium: 8.7 mg/dL — ABNORMAL LOW (ref 8.9–10.3)
Chloride: 105 mmol/L (ref 98–111)
Creatinine: 1.62 mg/dL — ABNORMAL HIGH (ref 0.61–1.24)
GFR, Est AFR Am: 47 mL/min — ABNORMAL LOW (ref >60)
GFR, Estimated: 41 mL/min — ABNORMAL LOW (ref >60)
Glucose, Bld: 154 mg/dL — ABNORMAL HIGH (ref 70–99)
Potassium: 3.5 mmol/L (ref 3.5–5.1)
Sodium: 141 mmol/L (ref 135–145)
Total Bilirubin: 1.2 mg/dL (ref 0.3–1.2)
Total Protein: 7 g/dL (ref 6.5–8.1)

## 2019-03-04 NOTE — Progress Notes (Signed)
HEMATOLOGY/ONCOLOGY CLINIC NOTE  Date of Service: 03/04/19   Patient Care Team: [name], MD as PCP - General (Internal Medicine) Burnell Blanks, MD as PCP - Cardiology (Cardiology) Brunetta Genera, MD as Consulting Physician (Hematology) Nephrology - Dr Madelon Lips MD Dr Henrene Pastor MD - GI  CHIEF COMPLAINTS/PURPOSE OF CONSULTATION:  AL Amyloidosis -with Nephrotic syndrome . No overt CHF pEF  HISTORY OF PRESENTING ILLNESS:   Alex Colt. is a wonderful 78 y.o. male who has been referred to [place] by Dr .[name], MD /Dr Madelon Lips MD for evaluation and management of newly diagnosed Kidney AL Amyloidosis with nephrotic syndrome.  Patient has a history of hypertension, dyslipidemia, coronary artery disease, seizure disorder on Dilantin who was apparently in his usual state of health until 3 months ago when he started developing new onset lower extremity edema. Patient had a UA at the time that showed 4+ protein in 24-hour collection revealed 15 g of protein. Additional workup showed a creatinine of 0.8 with an albumin of 2.3 and negative SPEP and UPEP. Apparently had an elevated K/L SFLC ratio of 5.19.  He was urgently referred by his primary care physician to nephrology for additional evaluation. Patient notes no bleeding issues. No nosebleeds. No periorbital bleeding. No abnormal skin rashes. Has had significant NSAID exposure and use to take a fair amount of naproxen for chronic low back pain but has cut down on this.  Due to lack of clear etiology for his nephrotic syndrome the patient underwent a kidney biopsy on 08/06/2015 accession [id number] which showed AL amyloidosis, lambda immunophenotype, [place] red positive. Noted to have severe arteriosclerosis with moderate tubular interstitial scarring.  Patient was referred to [place] for further evaluation and treatment of his AL Amyloidosis, Patient notes that his leg swelling has improved with diuretic  therapy.  He notes no weight loss. No night sweats. No focal bone pains. No skin rashes. Lungs normal bleeding or bruising.  Patient notes that he had a lot of reading online and he and his wife had an extensive list of questions which were answered in detail.  He notes he has had some chronic issues with upper abdominal pain and nausea and that he follows with Dr. Henrene Pastor and is apparently being scheduled for an EGD and colonoscopy according to his report.  Denies having and lacks tongue. No chest pain and no overt new shortness of breath. Patient is uncertain if an echocardiogram has been ordered or scheduled.  INTERVAL HISTORY  Alex [name] is here for a scheduled follow-up for his AL amyloidosis. We are joined today by Alex [name]. The patient's last visit with [place] was on 01/01/2019. The pt reports that he is doing well overall.  The pt reports that he has been feeling well and is only using oxygen at night when he sleeps. He is also using a humidifier during the night. Alex [name] notes that his Pulmonologist, Dr Vaughan Browner will do a repeat CT to see if pt's lung inflammation has resolved. Dr Vaughan Browner has also started pt on Symbicort and pt feels that it has improved his breathing. On Friday he walked around the block without his oxygen. Sometimes his oxygen will get to the low 90's or high 80's when he walks according to his pulse oximeter but after a brief resting period they return to normal. Pt reports that it does take some time while walking before his O2 levels decrease. Pt is having some right leg swelling that is  improved after rest and elevation. He has continued to take Eliquis and Acyclovir. He has been drinking about 64 ounces of water per day. Pt reports that he has been experiencing nasal draining and denies any known seasonal allergies.   Of note since the patient's last visit, pt has had CT Chest ([id number]) completed on 02/04/2019 with results revealing "1. Basilar predominant  pulmonary parenchymal scarring and retraction, as discussed above, consistent with postinfectious/postinflammatory fibrosis related to COVID-19 infection. Findings are suggestive of an alternative diagnosis (not UIP) per consensus guidelines: Diagnosis of Idiopathic Pulmonary Fibrosis: An Official ATS/ERS/JRS/ALAT Clinical Practice Guideline. [place], Iss 5, [phone], Dec 24 2016. 2. Aortic atherosclerosis (ICD10-170.0). Coronary artery calcification."  Lab results today (03/04/19) of CBC w/diff and CMP is as follows: all values are WNL except for Hgb at 12.1, HCT at 37.9, Glucose at 154, Creatinine at 1.62, Calcium at 8.7, GFR Est Non Af Am at 41.  03/04/2019 MMP is in progress  03/04/2019 K/L light chains is in progress  On review of systems, pt reports improved SOB, right leg swelling, post-nasal drip and denies abdominal pain and any other symptoms.   MEDICAL HISTORY:  Past Medical History:  Diagnosis Date   Amyloidosis ([place])    Arthritis    Basal cell carcinoma    CAD (coronary artery disease)    Lexiscan Myoview 6/14: Normal study, no scar or ischemia, EF 62%   Cataract    Chronic kidney disease    Colon polyp    Coronary artery disease    moderate disease by cath 2011   Diverticulosis of colon    GERD (gastroesophageal reflux disease)    Hearing loss    Heart disease    Hiatal hernia    Hyperlipidemia    Hypertension    MRSA (methicillin resistant staph aureus) culture positive    Seizure disorder ([place])    Seizures ([place])    last in 1970s  Obstructive sleep apnea Gastroesophageal reflux disease Aortic stenosis Erectile dysfunction Peripheral neuropathy Seizure disorder   SURGICAL HISTORY: Past Surgical History:  Procedure Laterality Date   BUNIONECTOMY     CARPAL TUNNEL RELEASE  1994   left   CERVICAL FUSION     compression fracture     FOOT SURGERY     right -twice, left foot once   HERNIA REPAIR     Umbilical  with PVP   KNEE ARTHROSCOPY     left knee   KNEE SURGERY  1996   left - scope   NASAL SEPTUM SURGERY     NECK SURGERY     NOSE SURGERY     twice   PAROTIDECTOMY Right 03/09/2017   ROTATOR CUFF REPAIR     twice, both shoulders   ROTATOR CUFF REPAIR     SHOULDER SURGERY     SKIN BIOPSY     SPINE SURGERY     C2, C3, C4   TRIGGER FINGER RELEASE     both hands, twice   TRIGGER FINGER RELEASE     VASECTOMY      SOCIAL HISTORY: Social History   Socioeconomic History   Marital status: Married    Spouse name: Not on file   Number of children: 2   Years of education: 12   Highest education level: Not on file  Occupational History   Occupation: Retired  Scientist, product/process development strain: Not on file   Food insecurity    Worry: Not  on file    Inability: Not on file   Transportation needs    Medical: Not on file    Non-medical: Not on file  Tobacco Use   Smoking status: Former Smoker    Packs/day: 3.00    Years: 20.00    Pack years: 60.00    Types: Cigarettes    Quit date: 04/25/1969    Years since quitting: 49.8   Smokeless tobacco: Former Systems developer  Substance and Sexual Activity   Alcohol use: Yes    Alcohol/week: 7.0 - 14.0 standard drinks    Types: 7 - 14 Glasses of wine per week    Comment: socially shots of wiskey   Drug use: No   Sexual activity: Not on file  Lifestyle   Physical activity    Days per week: Not on file    Minutes per session: Not on file   Stress: Not on file  Relationships   Social connections    Talks on phone: Not on file    Gets together: Not on file    Attends religious service: Not on file    Active member of club or organization: Not on file    Attends meetings of clubs or organizations: Not on file    Relationship status: Not on file   Intimate partner violence    Fear of current or ex partner: Not on file    Emotionally abused: Not on file    Physically abused: Not on file    Forced sexual  activity: Not on file  Other Topics Concern   Not on file  Social History Narrative   ** Merged History Encounter **   Lives at home w/ his wife   Right-handed   Caffeine: occasional Pepsi      Patient is currently retired.   FAMILY HISTORY: Family History  Problem Relation Age of Onset   Diabetes Father    Heart disease Father    Heart attack Father 5   Hypertension Father    Cancer Sister    Hearing loss Mother    Eczema Sister    Hyperlipidemia Other    Diabetes Other    Hypertension Other    Seizures Other    Thyroid disease Other    Colon cancer Neg Hx    Stroke Neg Hx     ALLERGIES:  is allergic to ramipril.  MEDICATIONS:  Current Outpatient Medications  Medication Sig Dispense Refill   acyclovir (ZOVIRAX) 400 MG tablet Take 1 tablet (400 mg total) by mouth 2 (two) times daily. 60 tablet 2   albuterol (VENTOLIN HFA) 108 (90 Base) MCG/ACT inhaler Inhale 2 puffs into the lungs every 4 (four) hours as needed for shortness of breath or wheezing.     apixaban (ELIQUIS) 5 MG TABS tablet Take 1 tablet (5 mg total) by mouth 2 (two) times daily. 60 tablet 1   Ascorbic Acid (VITAMIN C) 1000 MG tablet Take 1,000 mg by mouth 2 (two) times a day.      aspirin EC 81 MG tablet Take 81 mg by mouth daily.     atorvastatin (LIPITOR) 40 MG tablet Take 40 mg by mouth at bedtime.      budesonide-formoterol (SYMBICORT) 160-4.5 MCG/ACT inhaler Inhale 2 puffs into the lungs 2 (two) times daily. 1 Inhaler 0   Cholecalciferol (D3-1000) 25 MCG (1000 UT) tablet Take 1,000 Units by mouth 2 (two) times a day.      ezetimibe (ZETIA) 10 MG tablet Take 10 mg by  mouth daily.      folic acid (FOLVITE) 1 MG tablet Take 1 mg by mouth at bedtime.      isosorbide mononitrate (IMDUR) 15 mg TB24 24 hr tablet Take 15 mg by mouth daily.     Multiple Vitamin (MULTI-VITAMINS) TABS Take 1 tablet by mouth daily.     mupirocin ointment (BACTROBAN) 2 % Apply 1 application topically  as needed (rash).      nitroGLYCERIN (NITROSTAT) 0.4 MG SL tablet Place 0.4 mg under the tongue every 5 (five) minutes as needed for chest pain.     ondansetron (ZOFRAN) 8 MG tablet Take 8 mg by mouth 2 (two) times daily as needed for nausea or vomiting.     pantoprazole (PROTONIX) 40 MG tablet Take 80 mg by mouth daily.      triamcinolone cream (KENALOG) 0.1 % Apply 1 application topically as needed for rash.     No current facility-administered medications for this visit.     REVIEW OF SYSTEMS:   A 10+ POINT REVIEW OF SYSTEMS WAS OBTAINED including neurology, dermatology, psychiatry, cardiac, respiratory, lymph, extremities, GI, GU, Musculoskeletal, constitutional, breasts, reproductive, HEENT.  All pertinent positives are noted in the HPI.  All others are negative.   PHYSICAL EXAMINATION: ECOG PERFORMANCE STATUS: 1 - Symptomatic but completely ambulatory  Vitals:   03/04/19 1415  BP: 122/70  Pulse: 76  Resp: 18  Temp: 98.2 F (36.8 C)  SpO2: 96%   Filed Weights   03/04/19 1415  Weight: 200 lb 1.6 oz (90.8 kg)   .Body mass index is 28.71 kg/m.  GENERAL:alert, in no acute distress and comfortable SKIN: no acute rashes, no significant lesions EYES: conjunctiva are pink and non-injected, sclera anicteric OROPHARYNX: MMM, no exudates, no oropharyngeal erythema or ulceration NECK: supple, no JVD LYMPH:  no palpable lymphadenopathy in the cervical, axillary or inguinal regions LUNGS: increased respiratory effort, rales at base HEART: regular rate & rhythm ABDOMEN:  normoactive bowel sounds , non tender, not distended. No palpable hepatosplenomegaly.  Extremity: no pedal edema PSYCH: alert & oriented x 3 with fluent speech NEURO: no focal motor/sensory deficits  LABORATORY DATA:  I have reviewed the data as listed  . CBC Latest Ref Rng & Units 03/04/2019 01/01/2019 11/15/2018  WBC 4.0 - 10.5 K/uL 7.2 8.8 6.0  Hemoglobin 13.0 - 17.0 g/dL 12.1(L) 12.8(L) 12.0(L)    Hematocrit 39.0 - 52.0 % 37.9(L) 38.9(L) 37.0(L)  Platelets 150 - 400 K/uL 162 149(L) 84(L)    CMP Latest Ref Rng & Units 03/04/2019 01/01/2019 11/15/2018  Glucose 70 - 99 mg/dL 154(H) 96 85  BUN 8 - 23 mg/dL 17 17 32(H)  Creatinine 0.61 - 1.24 mg/dL 1.62(H) 1.23 1.17  Sodium 135 - 145 mmol/L 141 139 141  Potassium 3.5 - 5.1 mmol/L 3.5 3.8 4.1  Chloride 98 - 111 mmol/L 105 104 107  CO2 22 - 32 mmol/L '25 25 27  ' Calcium 8.9 - 10.3 mg/dL 8.7(L) 8.8(L) 8.1(L)  Total Protein 6.5 - 8.1 g/dL 7.0 6.8 5.1(L)  Total Bilirubin 0.3 - 1.2 mg/dL 1.2 0.8 0.9  Alkaline Phos 38 - 126 U/L 74 80 63  AST 15 - 41 U/L '20 20 26  ' ALT 0 - 44 U/L 17 18 33        RADIOGRAPHIC STUDIES: I have personally reviewed the radiological images as listed and agreed with the findings in the report. Ct Chest High Resolution  Result Date: 02/04/2019 CLINICAL DATA:  Chronic shortness of breath. COVID-19 hospitalization in  July. EXAM: CT CHEST WITHOUT CONTRAST TECHNIQUE: Multidetector CT imaging of the chest was performed following the standard protocol without intravenous contrast. High resolution imaging of the lungs, as well as inspiratory and expiratory imaging, was performed. COMPARISON:  11/08/2018. FINDINGS: Cardiovascular: Atherosclerotic calcification of the aorta, aortic valve and coronary arteries. Heart is at the upper limits of normal in size. No pericardial effusion. Mediastinum/Nodes: Mediastinal lymph nodes are not enlarged by CT size criteria. No axillary adenopathy. Hilar regions are difficult to definitively evaluate without IV contrast. Esophagus is grossly unremarkable. Lungs/Pleura: Basilar predominant traction bronchiectasis/bronchiolectasis, ground-glass, subpleural reticulation and architectural distortion. No pleural fluid. Airway is unremarkable. No air trapping. Upper Abdomen: Visualized portions of the liver, gallbladder, adrenal glands, kidneys, spleen, pancreas, stomach and bowel are grossly  unremarkable. There may be a small hiatal hernia. No upper abdominal adenopathy. Musculoskeletal: Degenerative changes in the spine. No worrisome lytic or sclerotic lesions. IMPRESSION: 1. Basilar predominant pulmonary parenchymal scarring and retraction, as discussed above, consistent with postinfectious/postinflammatory fibrosis related to COVID-19 infection. Findings are suggestive of an alternative diagnosis (not UIP) per consensus guidelines: Diagnosis of Idiopathic Pulmonary Fibrosis: An Official ATS/ERS/JRS/ALAT Clinical Practice Guideline. [place], Iss 5, [phone], Dec 24 2016. 2. Aortic atherosclerosis (ICD10-170.0). Coronary artery calcification. Electronically Signed   By: Lorin Picket M.D.   On: 02/04/2019 11:13   Result status: Edited Result - FINAL                              *[employer].                        [place], [place] [address]                            [phone]  ------------------------------------------------------------------- Transthoracic Echocardiography  (Report amended )  Patient:    Alex [name], Alex [name] Alex #:       [id number] Study Date: 08/24/2016 Gender:     M Age:        57 Height:     180.3 cm Weight:     90.6 kg BSA:        2.15 m^2 Pt. Status: Room:   ATTENDING    Darlina Guys, MD  Willia Craze, Christopher  REFERRING    [place], [place], Outpatient  SONOGRAPHER  Roseanna Rainbow  cc:  ------------------------------------------------------------------- LV EF: 65% -   70%  ------------------------------------------------------------------- Indications:      Aortic stenosis 424.1.  ------------------------------------------------------------------- History:   PMH:  Amyloidosis.  Coronary artery disease.  Angina pectoris.  Risk factors:  Hypertension.  Dyslipidemia.  ------------------------------------------------------------------- Study Conclusions  - Left ventricle: The cavity size was normal. Wall thickness was   increased in a pattern of mild LVH. Systolic function was   vigorous. The estimated ejection fraction was in the range of 65%   to 70%. Wall motion was normal; there were no regional wall   motion abnormalities. Doppler parameters are consistent with   abnormal left ventricular relaxation (grade 1 diastolic  dysfunction). - Aortic valve: Valve mobility was mildly restricted. - Aortic root: The aortic root was mildly dilated. - Mitral valve: Calcified annulus.  Impressions:  - Vigorous LV systolic function; mild diastolic dysfunction; mild   LVH; calcified aortic valve with no significant AS (peak velocity   2 m/s; mean gradient 9 mmHg); mildly dilated aortic root.    ASSESSMENT & PLAN:   78 y.o.  male with above-mentioned multiple medical comorbidities with  #1 Biopsy proven Renal and Systemic AL Amyloidosis Initial BM Bx shows 9% plasma cells and amyloid deposits. Echo shows normal systolic function with only grade 1 diastolic dysfunction. Skeletal survey with no lytic lesions suggestive of multiple myeloma. Serum K/L ratio increased at about 6.22 on diagnosis and has now improved to 1.5 (wnl with normal kappa lambda serum free light chain ratio ) UPEP shows 24h protein improved from 15g to 5g daily 05/29/18 24hour UPEP no M spike   01/12/18 ECHO revealed LV EF of 60-65%   #2  Nephrotic Syndrome - likely related to AL amyloidosis.  Urine Study 07/26/17 showed Protein at 2.232g.  -07/19/17 the pt had a BM Bx exam which revealed Normocellular bone marrow (30-40%) with 2-3% plasma cells and focal amyloid deposition. UPEP 05/29/2018 - showed no M spike and total urinary protein of 152m/24h -- resolution of nephrotic syndrome.  PLAN: -Discussed pt labwork today, 03/04/19; all values are WNL except for Hgb at 12.1,  HCT at 37.9, Glucose at 154, Creatinine at 1.62, Calcium at 8.7, GFR Est Non Af Am at 41.  -Discussed 03/04/2019 MMP is in progress  -Discussed 03/04/2019 K/L light chains is in progress -01/01/2019 Kappa free light chain at 31.6, up from 26.5, all other values WNL -01/01/2019 MMP shows no M protein -Discussed 02/04/2019 CT Chest ([id number] which revealed "1. Basilar predominant pulmonary parenchymal scarring and retraction, as discussed above, consistent with postinfectious/postinflammatory fibrosis related to COVID-19 infection. Findings are suggestive of an alternative diagnosis (not UIP) per consensus guidelines: Diagnosis of Idiopathic Pulmonary Fibrosis: An Official ATS/ERS/JRS/ALAT Clinical Practice Guideline. [place] Iss 5, [phone] Dec 24 2016. 2. Aortic atherosclerosis (ICD10-170.0). Coronary artery calcification." -Discussed that it will take several months to determine the extent of the damage caused by his COVID19 related Pneumonia -Recommended that the pt continue to eat well, drink at least 48-64 oz of water each day, and walk 20-30 minutes each day.  -Will continue to hold [place] for now and monitor with labs  -Will continue repeat 24 hour urine study and repeat ECHO every 6 months -Continue 400 mg Acyclovir once per day  -Continue Vitamin B complex  -Will see back in 2 months with labs   #4 Patient Active Problem List   Diagnosis Date Noted   Pneumonia due to COVID-19 virus 10/29/2018   [place] with [place] ([facility] [facility] 10/29/2018   Thrombocytopenia ([facility] 10/29/2018   Left lower lobe pneumonia 04/22/2018   Sepsis due to pneumonia ([facility] 170/62/3762  Folliculitis 083/15/1761  AL amyloidosis ([facility] 060/73/7106  Umbilical hernia 026/94/8546  DIZZINESS 02/19/2010   CAD, NATIVE VESSEL 07/21/2009   CHEST PAIN-UNSPECIFIED 07/21/2009   HYPERLIPIDEMIA 04/30/2007   Essential hypertension 04/30/2007   GASTROESOPHAGEAL REFLUX  DISEASE 04/30/2007   SEIZURE DISORDER 04/30/2007   COMPRESSION FRACTURE, L1 VERTEBRA 04/30/2007   DIVERTICULOSIS, COLON, HX OF 04/30/2007   Other postprocedural status(V45.89) 04/30/2007  -Continue follow-up with primary care physician for management of other medical co-morbidities. -Recommend if pt experiences spike fever  to report to hospital with transplant team    FOLLOW UP: RTC with Dr [name] with labs in 2 months   The total time spent in the appt was 25 minutes and more than 50% was on counseling and direct patient cares.  All of the patient's questions were answered with apparent satisfaction. The patient knows to call the clinic with any problems, questions or concerns.    Sullivan Lone MD [place] AAHIVMS [facility] Hematology/Oncology Physician [facility]  (Office):       [phone] (Work cell):  [phone] (Fax):           [phone]  I, Yevette Edwards, am acting as a scribe for Dr. Sullivan Lone.   .I have reviewed the above documentation for accuracy and completeness, and I agree with the above. Brunetta Genera MD

## 2019-03-05 ENCOUNTER — Telehealth: Payer: Self-pay | Admitting: Hematology

## 2019-03-05 LAB — KAPPA/LAMBDA LIGHT CHAINS
Kappa free light chain: 22.9 mg/L — ABNORMAL HIGH (ref 3.3–19.4)
Kappa, lambda light chain ratio: 1.21 (ref 0.26–1.65)
Lambda free light chains: 18.9 mg/L (ref 5.7–26.3)

## 2019-03-05 NOTE — Telephone Encounter (Signed)
Scheduled appt per 11/9 los. ° °Spoke with pt and they are aware of the appt date and time. °

## 2019-03-06 DIAGNOSIS — L218 Other seborrheic dermatitis: Secondary | ICD-10-CM | POA: Diagnosis not present

## 2019-03-06 DIAGNOSIS — L658 Other specified nonscarring hair loss: Secondary | ICD-10-CM | POA: Diagnosis not present

## 2019-03-06 LAB — MULTIPLE MYELOMA PANEL, SERUM
Albumin SerPl Elph-Mcnc: 3.7 g/dL (ref 2.9–4.4)
Albumin/Glob SerPl: 1.5 (ref 0.7–1.7)
Alpha 1: 0.2 g/dL (ref 0.0–0.4)
Alpha2 Glob SerPl Elph-Mcnc: 0.9 g/dL (ref 0.4–1.0)
B-Globulin SerPl Elph-Mcnc: 0.8 g/dL (ref 0.7–1.3)
Gamma Glob SerPl Elph-Mcnc: 0.7 g/dL (ref 0.4–1.8)
Globulin, Total: 2.6 g/dL (ref 2.2–3.9)
IgA: 89 mg/dL (ref 61–437)
IgG (Immunoglobin G), Serum: 887 mg/dL (ref 603–1613)
IgM (Immunoglobulin M), Srm: 46 mg/dL (ref 15–143)
Total Protein ELP: 6.3 g/dL (ref 6.0–8.5)

## 2019-03-19 DIAGNOSIS — U071 COVID-19: Secondary | ICD-10-CM | POA: Diagnosis not present

## 2019-03-20 DIAGNOSIS — R972 Elevated prostate specific antigen [PSA]: Secondary | ICD-10-CM | POA: Diagnosis not present

## 2019-03-30 ENCOUNTER — Other Ambulatory Visit (HOSPITAL_COMMUNITY)
Admission: RE | Admit: 2019-03-30 | Discharge: 2019-03-30 | Disposition: A | Discharge status: Home / Self Care | Payer: Medicare HMO | Source: Ambulatory Visit | Attending: Pulmonary Disease | Admitting: Pulmonary Disease

## 2019-03-30 DIAGNOSIS — Z01812 Encounter for preprocedural laboratory examination: Secondary | ICD-10-CM | POA: Insufficient documentation

## 2019-03-30 DIAGNOSIS — Z20828 Contact with and (suspected) exposure to other viral communicable diseases: Secondary | ICD-10-CM | POA: Insufficient documentation

## 2019-04-01 LAB — NOVEL CORONAVIRUS, NAA (HOSP ORDER, SEND-OUT TO REF LAB; TAT 18-24 HRS): SARS-CoV-2, NAA: NOT DETECTED

## 2019-04-03 ENCOUNTER — Ambulatory Visit: Payer: Medicare HMO

## 2019-04-03 ENCOUNTER — Ambulatory Visit (INDEPENDENT_AMBULATORY_CARE_PROVIDER_SITE_OTHER): Payer: Medicare HMO | Admitting: Pulmonary Disease

## 2019-04-03 DIAGNOSIS — R0602 Shortness of breath: Secondary | ICD-10-CM

## 2019-04-03 LAB — PULMONARY FUNCTION TEST
DL/VA % pred: 67 %
DL/VA: 2.67 ml/min/mmHg/L
DLCO unc % pred: 44 %
DLCO unc: 10.94 ml/min/mmHg
FEF 25-75 Post: 4.14 L/sec
FEF 25-75 Pre: 3.83 L/sec
FEF2575-%Change-Post: 8 %
FEF2575-%Pred-Post: 194 %
FEF2575-%Pred-Pre: 179 %
FEV1-%Change-Post: -1 %
FEV1-%Pred-Post: 90 %
FEV1-%Pred-Pre: 91 %
FEV1-Post: 2.69 L
FEV1-Pre: 2.73 L
FEV1FVC-%Change-Post: 0 %
FEV1FVC-%Pred-Pre: 120 %
FEV6-%Change-Post: -1 %
FEV6-%Pred-Post: 80 %
FEV6-%Pred-Pre: 81 %
FEV6-Post: 3.1 L
FEV6-Pre: 3.13 L
FEV6FVC-%Change-Post: 0 %
FEV6FVC-%Pred-Post: 106 %
FEV6FVC-%Pred-Pre: 106 %
FVC-%Change-Post: 0 %
FVC-%Pred-Post: 75 %
FVC-%Pred-Pre: 76 %
FVC-Post: 3.11 L
FVC-Pre: 3.14 L
Post FEV1/FVC ratio: 87 %
Post FEV6/FVC ratio: 100 %
Pre FEV1/FVC ratio: 87 %
Pre FEV6/FVC Ratio: 100 %
RV % pred: 48 %
RV: 1.24 L
TLC % pred: 61 %
TLC: 4.29 L

## 2019-04-03 NOTE — Progress Notes (Signed)
PFT done today. 

## 2019-04-05 ENCOUNTER — Other Ambulatory Visit: Payer: Self-pay

## 2019-04-05 ENCOUNTER — Ambulatory Visit: Payer: Medicare HMO

## 2019-04-05 DIAGNOSIS — R0602 Shortness of breath: Secondary | ICD-10-CM

## 2019-04-05 NOTE — Progress Notes (Signed)
SIX MIN WALK 04/05/2019 02/05/2019  Medications Acyclovir 400mg , Eliquis 5mg , Vitamin C 1000mg , Aspirin 81mg , Vitamin D 1000units, Zetia 10mg , Isosorbide Mononitrte 15mg , Pantoprazole 40mg  at 8am Acyclovir 400 mg, Eliquis 5 mg, aspirin 81 mg, Vitamin C 1000 mg, isosorbide mononitrate 15 mg, multiple vitamin, ondansetron 8 mg, pantoprazole 40 mg between 8-10 AM.  Supplimental Oxygen during Test? (L/min) No No  Laps 12 16  Partial Lap (in Meters) 0 9  Baseline BP (sitting) 116/64 118/80  Baseline Heartrate 78 78  Baseline Dyspnea (Borg Scale) 0.5 0  Baseline Fatigue (Borg Scale) 0 0  Baseline SPO2 98 93  BP (sitting) 132/90 140/78  Heartrate 95 89  Dyspnea (Borg Scale) 4 3  Fatigue (Borg Scale) 1 0  SPO2 92 91  BP (sitting) 126/80 120/82  Heartrate 79 79  SPO2 96 98  Stopped or Paused before Six Minutes No No  Interpretation (No Data) Hip pain  Distance Completed [phone]  Tech Comments: Pt walked at an average pace without stopping completing the entire 6 minutes. Pt was "huffing" some but stated this is the best he has done in a while with this amount of walking after recent hospitalization. Pt completed 6 min walk at a moderate pace. Tolerated well.

## 2019-04-10 ENCOUNTER — Other Ambulatory Visit: Payer: Self-pay

## 2019-04-10 ENCOUNTER — Ambulatory Visit (INDEPENDENT_AMBULATORY_CARE_PROVIDER_SITE_OTHER): Payer: Medicare HMO | Admitting: Pulmonary Disease

## 2019-04-10 ENCOUNTER — Encounter: Payer: Self-pay | Admitting: Pulmonary Disease

## 2019-04-10 VITALS — BP 122/80 | HR 70 | Temp 97.0°F | Wt 199.6 lb

## 2019-04-10 DIAGNOSIS — R0602 Shortness of breath: Secondary | ICD-10-CM

## 2019-04-10 NOTE — Patient Instructions (Signed)
I am glad that your breathing is slowly improving Your lung function test showed reduced lung capacity which is consistent with pulmonary fibrosis Continue to work on exercise at home Continue supplemental oxygen Follow-up in 3 months.

## 2019-04-10 NOTE — Progress Notes (Signed)
Alex [name].    [id number]    1940/08/24  Primary Care Physician:Shaw, Gwyndolyn Saxon, MD  Referring Physician: [name], MD [address] [place],  [place] [address]  Chief complaint: Follow up for pulmonary fibrosis, post COVID-19 pneumonia  HPI: Alex [name] is here for evaluation of dyspnea  He has past medical history of AL amyloidosis with nephrotic syndrome, hypertension, moderate nonobstructive CAD by cath 2011, aortic stenosis, GERD, hyperlipidemia, carotid artery disease.  Hospitalized from July 5 to July 25 at [facility] for COVID-19 pneumonia. He has received treatment with Remdesivir 7/6-7/10, steroids for 10 days up until 7/15, received Actemra on 7/11 and plasma on 7/10.  He also received hydroxychloroquine per his request after discussion of risks and benefits. Hospital course complicated with subsegmental pulmonary embolism and he is on anticoagulation,  pneumomediastinum that was treated conservatively.  Maintained on high flow nasal cannula.  Did not need to be intubated He has been discharged on supplemental oxygen.  Continues to have significant dyspnea on exertion at home.  He is working with physical therapy and trying to stay active at home.  History notable for amyloidosis status post autologous stem cell transplant in 2018.  Recently saw Dr. Minda Ditto, hematologist and maintenance [place] for amyloidosis is on hold.  Pets: Has a dog and cat.  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  No asbestos, mold, hot tub, Jacuzzi, down pillows or comforters Smoking history: 30-45-pack-year smoker.  Quit in 1971 Travel history: Previously lived in [place].  No significant recent travel Relevant family history: No significant family history of lung disease  Interim history: Continues to have dyspnea on exertion which is stable to slightly improved compared to last visit..   Continues on supplemental oxygen Here for review of PFTs  Outpatient Encounter Medications as of 04/10/2019  Medication Sig  . acyclovir (ZOVIRAX) 400 MG tablet Take 1 tablet (400 mg total) by mouth 2 (two) times daily.  [name] Kitchen apixaban (ELIQUIS) 5 MG TABS tablet Take 1 tablet (5 mg total) by mouth 2 (two) times daily.  . Ascorbic Acid (VITAMIN C) 1000 MG tablet Take 1,000 mg by mouth 2 (two) times a day.   [name] Kitchen aspirin EC 81 MG tablet Take 81 mg by mouth daily.  [name] Kitchen atorvastatin (LIPITOR) 40 MG tablet Take 40 mg by mouth at bedtime.   [name] Kitchen b complex vitamins capsule Take 1 capsule by mouth daily.  . budesonide-formoterol (SYMBICORT) 160-4.5 MCG/ACT inhaler Inhale 2 puffs into the lungs 2 (two) times daily.  . Cholecalciferol (D3-1000) 25 MCG (1000 UT) tablet Take 1,000 Units by mouth 2 (two) times a day.   . ezetimibe (ZETIA) 10 MG tablet Take 10 mg by mouth daily.   . folic acid (FOLVITE) 1 MG tablet Take 1 mg by mouth at bedtime.   . furosemide (LASIX) 40 MG tablet Take 40 mg by mouth daily.  . isosorbide mononitrate (IMDUR) 15 mg TB24 24 hr tablet Take 15 mg by mouth daily.  . Multiple Vitamin (MULTI-VITAMINS) TABS Take 1 tablet by mouth daily.  . mupirocin ointment (BACTROBAN) 2 % Apply 1 application topically as needed (rash).   . nitroGLYCERIN (NITROSTAT) 0.4 MG SL tablet Place 0.4 mg under the tongue every 5 (five) minutes as needed for chest pain.  . pantoprazole (PROTONIX) 40 MG tablet Take 80 mg by mouth daily.   . potassium chloride SA (KLOR-CON) 20 MEQ  tablet Take 20 mEq by mouth daily.  [name] Kitchen triamcinolone cream (KENALOG) 0.1 % Apply 1 application topically as needed for rash.  . vitamin B-12 (CYANOCOBALAMIN) 500 MCG tablet Take 500 mcg by mouth 2 (two) times daily.  . Zinc 50 MG TABS Take 1 tablet by mouth daily.  [name] Kitchen albuterol (VENTOLIN HFA) 108 (90 Base) MCG/ACT inhaler Inhale 2 puffs into the lungs every 4 (four) hours as needed for shortness of breath or wheezing.  . [DISCONTINUED]  ondansetron (ZOFRAN) 8 MG tablet Take 8 mg by mouth 2 (two) times daily as needed for nausea or vomiting.   No facility-administered encounter medications on file as of 04/10/2019.    Allergies as of 01/10/2019 - Review Complete 01/10/2019  Allergen Reaction Noted  . Ramipril Cough    Physical Exam: Blood pressure 122/80, pulse 70, temperature (!) 97 F (36.1 C), temperature source Temporal, weight 199 lb 9.6 oz (90.5 kg), SpO2 99 %. Gen:      No acute distress HEENT:  EOMI, sclera anicteric Neck:     No masses; no thyromegaly Lungs:    Bibasal crackles CV:         Regular rate and rhythm; no murmurs Abd:      + bowel sounds; soft, non-tender; no palpable masses, no distension Ext:    No edema; adequate peripheral perfusion Skin:      Warm and dry; no rash Neuro: alert and oriented x 3 Psych: normal mood and affect  Data Reviewed: Imaging: CT chest 11/16/2016- peripheral GG opacities at the lung base.  Bilateral pulmonary nodules measuring 5 to 6 mm.   CT abdomen pelvis 12/28/2017- visualized lung bases appear clear. CTA 10/29/2018- no PE.  Multiple bilateral hazy pulmonary infiltrates CTA 11/01/2018- positive for PE.  Diffuse bilateral groundglass opacities. CT chest 11/08/2018 increasing groundglass opacities, consolidative changes.  Small pneumomediastinum.   High-resolution CT 02/04/2019- basilar predominant pulmonary fibrosis with traction bronchiectasis.  No honeycombing.  I have reviewed the images personally.  PFTs 04/03/2019 FVC 3.11 [75%], FEV1 2.69 [90%], F/F 87, TLC 4.29 [61%], DLCO 10.94 [44%] Moderate restriction with severe diffusion defect  6-minute walk test 04/05/2019-408 m, no desats  Cardiac: Echocardiogram 11/08/2018-LVEF 60-65% mild to moderate AS.  Right ventricle is not well visualized right atrial pressure is estimated at 10 mm.  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 Had an extensive discussion with patient and wife in clinic today.  Told him that this is a new disease and we are not clear on the optimal treatment for this. Based on previous experience with post ARDS pulmonary fibrosis this will likely improve over time however post COVID-19 pulmonary fibrosis may have a different trajectory.  I suspect that he will have a prolonged recovery and I have tried to set his expectations regarding this.  He has finished pulmonary rehab and continues to be active with regular exercise at home. We are awaiting pulmonary function test and 6-minute walk test.  Continue close monitoring.  If there is progression then consider anti-fibrotics. Continue supplemental oxygen.  Stop Symbicort inhaler as there is no evidence of airway obstruction.  Plan/Recommendations: - Stop Symbicort - Continue exercise regimen  [name] MD [facility] 04/10/2019, 2:33 PM  CC: [name], MD

## 2019-04-12 DIAGNOSIS — I700 Atherosclerosis of aorta: Secondary | ICD-10-CM | POA: Diagnosis not present

## 2019-04-12 DIAGNOSIS — Z9484 Stem cells transplant status: Secondary | ICD-10-CM | POA: Diagnosis not present

## 2019-04-12 DIAGNOSIS — I2510 Atherosclerotic heart disease of native coronary artery without angina pectoris: Secondary | ICD-10-CM | POA: Diagnosis not present

## 2019-04-12 DIAGNOSIS — R011 Cardiac murmur, unspecified: Secondary | ICD-10-CM | POA: Diagnosis not present

## 2019-04-12 DIAGNOSIS — Z79899 Other long term (current) drug therapy: Secondary | ICD-10-CM | POA: Diagnosis not present

## 2019-04-12 DIAGNOSIS — E8581 Light chain (AL) amyloidosis: Secondary | ICD-10-CM | POA: Diagnosis not present

## 2019-04-12 DIAGNOSIS — J984 Other disorders of lung: Secondary | ICD-10-CM | POA: Diagnosis not present

## 2019-04-12 DIAGNOSIS — Z87891 Personal history of nicotine dependence: Secondary | ICD-10-CM | POA: Diagnosis not present

## 2019-04-12 DIAGNOSIS — Z23 Encounter for immunization: Secondary | ICD-10-CM | POA: Diagnosis not present

## 2019-04-18 DIAGNOSIS — U071 COVID-19: Secondary | ICD-10-CM | POA: Diagnosis not present

## 2019-05-06 ENCOUNTER — Inpatient Hospital Stay: Payer: Medicare Other | Attending: Hematology

## 2019-05-06 ENCOUNTER — Other Ambulatory Visit: Payer: Self-pay

## 2019-05-06 ENCOUNTER — Inpatient Hospital Stay (HOSPITAL_BASED_OUTPATIENT_CLINIC_OR_DEPARTMENT_OTHER): Payer: Medicare Other | Admitting: Hematology

## 2019-05-06 VITALS — BP 113/67 | HR 86 | Temp 98.0°F | Resp 18 | Ht 70.0 in | Wt 199.5 lb

## 2019-05-06 DIAGNOSIS — N049 Nephrotic syndrome with unspecified morphologic changes: Secondary | ICD-10-CM | POA: Insufficient documentation

## 2019-05-06 DIAGNOSIS — E8581 Light chain (AL) amyloidosis: Secondary | ICD-10-CM | POA: Diagnosis not present

## 2019-05-06 LAB — CMP (CANCER CENTER ONLY)
ALT: 13 U/L (ref 0–44)
AST: 18 U/L (ref 15–41)
Albumin: 3.6 g/dL (ref 3.5–5.0)
Alkaline Phosphatase: 92 U/L (ref 38–126)
Anion gap: 10 (ref 5–15)
BUN: 16 mg/dL (ref 8–23)
CO2: 24 mmol/L (ref 22–32)
Calcium: 8.8 mg/dL — ABNORMAL LOW (ref 8.9–10.3)
Chloride: 108 mmol/L (ref 98–111)
Creatinine: 1.48 mg/dL — ABNORMAL HIGH (ref 0.61–1.24)
GFR, Est AFR Am: 53 mL/min — ABNORMAL LOW (ref >60)
GFR, Estimated: 45 mL/min — ABNORMAL LOW (ref >60)
Glucose, Bld: 132 mg/dL — ABNORMAL HIGH (ref 70–99)
Potassium: 3.6 mmol/L (ref 3.5–5.1)
Sodium: 142 mmol/L (ref 135–145)
Total Bilirubin: 0.8 mg/dL (ref 0.3–1.2)
Total Protein: 6.9 g/dL (ref 6.5–8.1)

## 2019-05-06 LAB — CBC WITH DIFFERENTIAL/PLATELET
Abs Immature Granulocytes: 0.02 10*3/uL (ref 0.00–0.07)
Basophils Absolute: 0 10*3/uL (ref 0.0–0.1)
Basophils Relative: 1 %
Eosinophils Absolute: 0.6 10*3/uL — ABNORMAL HIGH (ref 0.0–0.5)
Eosinophils Relative: 8 %
HCT: 40.4 % (ref 39.0–52.0)
Hemoglobin: 13.1 g/dL (ref 13.0–17.0)
Immature Granulocytes: 0 %
Lymphocytes Relative: 18 %
Lymphs Abs: 1.3 10*3/uL (ref 0.7–4.0)
MCH: 27.8 pg (ref 26.0–34.0)
MCHC: 32.4 g/dL (ref 30.0–36.0)
MCV: 85.6 fL (ref 80.0–100.0)
Monocytes Absolute: 0.5 10*3/uL (ref 0.1–1.0)
Monocytes Relative: 7 %
Neutro Abs: 5.1 10*3/uL (ref 1.7–7.7)
Neutrophils Relative %: 66 %
Platelets: 189 10*3/uL (ref 150–400)
RBC: 4.72 MIL/uL (ref 4.22–5.81)
RDW: 15.9 % — ABNORMAL HIGH (ref 11.5–15.5)
WBC: 7.6 10*3/uL (ref 4.0–10.5)
nRBC: 0 % (ref 0.0–0.2)

## 2019-05-06 NOTE — Progress Notes (Signed)
HEMATOLOGY/ONCOLOGY CLINIC NOTE  Date of Service: 05/06/19   Patient Care Team: [name], MD as PCP - General (Internal Medicine) Burnell Blanks, MD as PCP - Cardiology (Cardiology) Brunetta Genera, MD as Consulting Physician (Hematology) Nephrology - Dr Madelon Lips MD Dr Henrene Pastor MD - GI  CHIEF COMPLAINTS/PURPOSE OF CONSULTATION:  AL Amyloidosis -with Nephrotic syndrome . No overt CHF pEF  HISTORY OF PRESENTING ILLNESS:   Alex [name]. is a wonderful 79 y.o. male who has been referred to [place] by Dr .[name], MD /Dr Madelon Lips MD for evaluation and management of newly diagnosed Kidney AL Amyloidosis with nephrotic syndrome.  Patient has a history of hypertension, dyslipidemia, coronary artery disease, seizure disorder on Dilantin who was apparently in his usual state of health until 3 months ago when he started developing new onset lower extremity edema. Patient had a UA at the time that showed 4+ protein in 24-hour collection revealed 15 g of protein. Additional workup showed a creatinine of 0.8 with an albumin of 2.3 and negative SPEP and UPEP. Apparently had an elevated K/L SFLC ratio of 5.19.  He was urgently referred by his primary care physician to nephrology for additional evaluation. Patient notes no bleeding issues. No nosebleeds. No periorbital bleeding. No abnormal skin rashes. Has had significant NSAID exposure and use to take a fair amount of naproxen for chronic low back pain but has cut down on this.  Due to lack of clear etiology for his nephrotic syndrome the patient underwent a kidney biopsy on 08/06/2015 accession [id number] which showed AL amyloidosis, lambda immunophenotype, [place] red positive. Noted to have severe arteriosclerosis with moderate tubular interstitial scarring.  Patient was referred to [place] for further evaluation and treatment of his AL Amyloidosis, Patient notes that his leg swelling has improved with diuretic  therapy.  He notes no weight loss. No night sweats. No focal bone pains. No skin rashes. Lungs normal bleeding or bruising.  Patient notes that he had a lot of reading online and he and his wife had an extensive list of questions which were answered in detail.  He notes he has had some chronic issues with upper abdominal pain and nausea and that he follows with Dr. Henrene Pastor and is apparently being scheduled for an EGD and colonoscopy according to his report.  Denies having and lacks tongue. No chest pain and no overt new shortness of breath. Patient is uncertain if an echocardiogram has been ordered or scheduled.  INTERVAL HISTORY  [name] is here for a scheduled follow-up for his AL amyloidosis. We are joined today by Mrs. Burciaga. The patient's last visit with [place] was on 03/04/2019. The pt reports that he is doing well overall.  The pt reports that he has been well but his breathing has not improved much since our last meeting. Mrs. Stach notes that the cold air is really bothersome to him. Pt is having difficutly walking around the house without becoming SOB. At home his oxygen saturation levels stay within the 90's. Pt uses his oxygen at night, or when he becomes extremely SOB, and typically uses 2 liters per day  He is continuing to follow with Dr. Tammi Klippel. Pt did LFT 3 weeks ago and there was improvement seen. He was supposed to have a prostate biopsy but was not allowed one due to being on Eliquis. He is concerned about an elevating PSA level. Pt has also been experiencing leg cramps.   Pt is no longer  using Kenalog or Albuterol and is taking 5 mg of Zetia instead of the prescribed 10 mg.   Lab results today (05/06/19) of CBC w/diff and CMP is as follows: all values are WNL except for RDW at 15.9, Eos Abs at 0.6K, Glucose at 132, Creatinine at 1.48, Calcium at 8.8, GFR Est Non Af Am at 45. 05/06/2019 MMP is in progress  05/06/2019 K/L light chains are in progress   On review of  systems, pt reports SOB, leg cramping and denies fevers, chills, night sweats, abdominal pain, bowel movement issues and any other symptoms.   MEDICAL HISTORY:  Past Medical History:  Diagnosis Date  . Amyloidosis ([place])   . Arthritis   . Basal cell carcinoma   . CAD (coronary artery disease)    Lexiscan Myoview 6/14: Normal study, no scar or ischemia, EF 62%  . Cataract   . Chronic kidney disease   . Colon polyp   . Coronary artery disease    moderate disease by cath 2011  . Diverticulosis of colon   . GERD (gastroesophageal reflux disease)   . Hearing loss   . Heart disease   . Hiatal hernia   . Hyperlipidemia   . Hypertension   . MRSA (methicillin resistant staph aureus) culture positive   . Seizure disorder ([place])   . Seizures ([place])    last in 1970s  Obstructive sleep apnea Gastroesophageal reflux disease Aortic stenosis Erectile dysfunction Peripheral neuropathy Seizure disorder   SURGICAL HISTORY: Past Surgical History:  Procedure Laterality Date  . BUNIONECTOMY    . CARPAL TUNNEL RELEASE  1994   left  . CERVICAL FUSION    . compression fracture    . FOOT SURGERY     right -twice, left foot once  . HERNIA REPAIR     Umbilical with PVP  . KNEE ARTHROSCOPY     left knee  . [place]   left - scope  . NASAL SEPTUM SURGERY    . NECK SURGERY    . NOSE SURGERY     twice  . PAROTIDECTOMY Right 03/09/2017  . ROTATOR CUFF REPAIR     twice, both shoulders  . ROTATOR CUFF REPAIR    . SHOULDER SURGERY    . SKIN BIOPSY    . SPINE SURGERY     C2, C3, C4  . TRIGGER FINGER RELEASE     both hands, twice  . TRIGGER FINGER RELEASE    . VASECTOMY      SOCIAL HISTORY: Social History   Socioeconomic History  . Marital status: Married    Spouse name: Not on file  . Number of children: 2  . Years of education: 69  . Highest education level: Not on file  Occupational History  . Occupation: Retired  Tobacco Use  . Smoking status: Former Smoker     Packs/day: 3.00    Years: 20.00    Pack years: 60.00    Types: Cigarettes    Quit date: 04/25/1969    Years since quitting: 50.0  . Smokeless tobacco: Former Network engineer and Sexual Activity  . Alcohol use: Yes    Alcohol/week: 7.0 - 14.0 standard drinks    Types: 7 - 14 Glasses of wine per week    Comment: socially shots of wiskey  . Drug use: No  . Sexual activity: Not on file  Other Topics Concern  . Not on file  Social History Narrative   ** Merged History Encounter **  Lives at home w/ his wife   Right-handed   Caffeine: occasional Pepsi       Social Determinants of Health   Financial Resource Strain:   . Difficulty of Paying Living Expenses: Not on file  Food Insecurity:   . Worried About Charity fundraiser in the Last Year: Not on file  . Ran Out of Food in the Last Year: Not on file  Transportation Needs:   . Lack of Transportation (Medical): Not on file  . Lack of Transportation (Non-Medical): Not on file  Physical Activity:   . Days of Exercise per Week: Not on file  . Minutes of Exercise per Session: Not on file  Stress:   . Feeling of Stress : Not on file  Social Connections:   . Frequency of Communication with Friends and Family: Not on file  . Frequency of Social Gatherings with Friends and Family: Not on file  . Attends Religious Services: Not on file  . Active Member of Clubs or Organizations: Not on file  . Attends Archivist Meetings: Not on file  . Marital Status: Not on file  Intimate Partner Violence:   . Fear of Current or Ex-Partner: Not on file  . Emotionally Abused: Not on file  . Physically Abused: Not on file  . Sexually Abused: Not on file  Patient is currently retired.   FAMILY HISTORY: Family History  Problem Relation Age of Onset  . Diabetes Father   . Heart disease Father   . Heart attack Father 78  . Hypertension Father   . Cancer Sister   . Hearing loss Mother   . Eczema Sister   . Hyperlipidemia Other   .  Diabetes Other   . Hypertension Other   . Seizures Other   . Thyroid disease Other   . Colon cancer Neg Hx   . Stroke Neg Hx     ALLERGIES:  is allergic to ramipril.  MEDICATIONS:  Current Outpatient Medications  Medication Sig Dispense Refill  . acyclovir (ZOVIRAX) 400 MG tablet Take 1 tablet (400 mg total) by mouth 2 (two) times daily. 60 tablet 2  . albuterol (VENTOLIN HFA) 108 (90 Base) MCG/ACT inhaler Inhale 2 puffs into the lungs every 4 (four) hours as needed for shortness of breath or wheezing.    [name] Kitchen apixaban (ELIQUIS) 5 MG TABS tablet Take 1 tablet (5 mg total) by mouth 2 (two) times daily. 60 tablet 1  . Ascorbic Acid (VITAMIN C) 1000 MG tablet Take 1,000 mg by mouth 2 (two) times a day.     [name] Kitchen aspirin EC 81 MG tablet Take 81 mg by mouth daily.    [name] Kitchen atorvastatin (LIPITOR) 40 MG tablet Take 40 mg by mouth at bedtime.     [name] Kitchen b complex vitamins capsule Take 1 capsule by mouth daily.    . budesonide-formoterol (SYMBICORT) 160-4.5 MCG/ACT inhaler Inhale 2 puffs into the lungs 2 (two) times daily. 1 Inhaler 0  . Cholecalciferol (D3-1000) 25 MCG (1000 UT) tablet Take 1,000 Units by mouth 2 (two) times a day.     . ezetimibe (ZETIA) 10 MG tablet Take 10 mg by mouth daily.     . folic acid (FOLVITE) 1 MG tablet Take 1 mg by mouth at bedtime.     . furosemide (LASIX) 40 MG tablet Take 40 mg by mouth daily.    . isosorbide mononitrate (IMDUR) 15 mg TB24 24 hr tablet Take 15 mg by mouth daily.    [name] Kitchen  Multiple Vitamin (MULTI-VITAMINS) TABS Take 1 tablet by mouth daily.    . mupirocin ointment (BACTROBAN) 2 % Apply 1 application topically as needed (rash).     . nitroGLYCERIN (NITROSTAT) 0.4 MG SL tablet Place 0.4 mg under the tongue every 5 (five) minutes as needed for chest pain.    . pantoprazole (PROTONIX) 40 MG tablet Take 80 mg by mouth daily.     . potassium chloride SA (KLOR-CON) 20 MEQ tablet Take 20 mEq by mouth daily.    [name] Kitchen triamcinolone cream (KENALOG) 0.1 % Apply 1 application  topically as needed for rash.    . vitamin B-12 (CYANOCOBALAMIN) 500 MCG tablet Take 500 mcg by mouth 2 (two) times daily.    . Zinc 50 MG TABS Take 1 tablet by mouth daily.     No current facility-administered medications for this visit.    REVIEW OF SYSTEMS:   A 10+ POINT REVIEW OF SYSTEMS WAS OBTAINED including neurology, dermatology, psychiatry, cardiac, respiratory, lymph, extremities, GI, GU, Musculoskeletal, constitutional, breasts, reproductive, HEENT.  All pertinent positives are noted in the HPI.  All others are negative.   PHYSICAL EXAMINATION: ECOG PERFORMANCE STATUS: 1 - Symptomatic but completely ambulatory  Vitals:   05/06/19 1348  BP: 113/67  Pulse: 86  Resp: 18  Temp: 98 F (36.7 C)  SpO2: 95%   Filed Weights   05/06/19 1348  Weight: 199 lb 8 oz (90.5 kg)   .Body mass index is 28.63 kg/m.   Exam was given in a chair   GENERAL:alert, in no acute distress and comfortable SKIN: no acute rashes, no significant lesions EYES: conjunctiva are pink and non-injected, sclera anicteric OROPHARYNX: MMM, no exudates, no oropharyngeal erythema or ulceration NECK: supple, no JVD LYMPH:  no palpable lymphadenopathy in the cervical, axillary or inguinal regions LUNGS: clear to auscultation b/l with normal respiratory effort HEART: regular rate & rhythm ABDOMEN:  normoactive bowel sounds , non tender, not distended. No palpable hepatosplenomegaly.  Extremity: no pedal edema PSYCH: alert & oriented x 3 with fluent speech NEURO: no focal motor/sensory deficits  LABORATORY DATA:  I have reviewed the data as listed  . CBC Latest Ref Rng & Units 05/06/2019 03/04/2019 01/01/2019  WBC 4.0 - 10.5 K/uL 7.6 7.2 8.8  Hemoglobin 13.0 - 17.0 g/dL 13.1 12.1(L) 12.8(L)  Hematocrit 39.0 - 52.0 % 40.4 37.9(L) 38.9(L)  Platelets 150 - 400 K/uL 189 162 149(L)    CMP Latest Ref Rng & Units 05/06/2019 03/04/2019 01/01/2019  Glucose 70 - 99 mg/dL 132(H) 154(H) 96  BUN 8 - 23 mg/dL _0 Creatinine 0.61 - 1.24 mg/dL 1.48(H) 1.62(H) 1.23  Sodium 135 - 145 mmol/L 142 141 139  Potassium 3.5 - 5.1 mmol/L 3.6 3.5 3.8  Chloride 98 - 111 mmol/L 108 105 104  CO2 22 - 32 mmol/L _1 Calcium 8.9 - 10.3 mg/dL 8.8(L) 8.7(L) 8.8(L)  Total Protein 6.5 - 8.1 g/dL 6.9 7.0 6.8  Total Bilirubin 0.3 - 1.2 mg/dL 0.8 1.2 0.8  Alkaline Phos 38 - 126 U/L 92 74 80  AST 15 - 41 U/L _2 ALT 0 - 44 U/L _3 RADIOGRAPHIC STUDIES: I have personally reviewed the radiological images as listed and agreed with the findings in the report. No results found. Result status: Edited Result - FINAL                              *  [facility].                        [place], [place] [address]                            [phone]  ------------------------------------------------------------------- Transthoracic Echocardiography  (Report amended )  Patient:    Alex [name], Alex [name] MR #:       [id number] Study Date: 08/24/2016 Gender:     M Age:        40 Height:     180.3 cm Weight:     90.6 kg BSA:        2.15 m^2 Pt. Status: Room:   ATTENDING    Darlina Guys, MD  Willia Craze, Christopher  REFERRING    [place], [place], Outpatient  SONOGRAPHER  Roseanna Rainbow  cc:  ------------------------------------------------------------------- LV EF: 65% -   70%  ------------------------------------------------------------------- Indications:      Aortic stenosis 424.1.  ------------------------------------------------------------------- History:   PMH:  Amyloidosis.  Coronary artery disease.  Angina pectoris.  Risk factors:  Hypertension. Dyslipidemia.  ------------------------------------------------------------------- Study Conclusions  - Left ventricle: The cavity size was normal. Wall thickness was   increased in a pattern of mild LVH.  Systolic function was   vigorous. The estimated ejection fraction was in the range of 65%   to 70%. Wall motion was normal; there were no regional wall   motion abnormalities. Doppler parameters are consistent with   abnormal left ventricular relaxation (grade 1 diastolic   dysfunction). - Aortic valve: Valve mobility was mildly restricted. - Aortic root: The aortic root was mildly dilated. - Mitral valve: Calcified annulus.  Impressions:  - Vigorous LV systolic function; mild diastolic dysfunction; mild   LVH; calcified aortic valve with no significant AS (peak velocity   2 m/s; mean gradient 9 mmHg); mildly dilated aortic root.    ASSESSMENT & PLAN:   79 y.o.  male with above-mentioned multiple medical comorbidities with  #1 Biopsy proven Renal and Systemic AL Amyloidosis Initial BM Bx shows 9% plasma cells and amyloid deposits. Echo shows normal systolic function with only grade 1 diastolic dysfunction. Skeletal survey with no lytic lesions suggestive of multiple myeloma. Serum K/L ratio increased at about 6.22 on diagnosis and has now improved to 1.5 (wnl with normal kappa lambda serum free light chain ratio ) UPEP shows 24h protein improved from 15g to 5g daily 05/29/18 24hour UPEP no M spike   01/12/18 ECHO revealed LV EF of 60-65%   #2  Nephrotic Syndrome - likely related to AL amyloidosis.  Urine Study 07/26/17 showed Protein at 2.232g.  -07/19/17 the pt had a BM Bx exam which revealed Normocellular bone marrow (30-40%) with 2-3% plasma cells and focal amyloid deposition. UPEP 05/29/2018 - showed no M spike and total urinary protein of 155m/24h -- resolution of nephrotic syndrome.   PLAN: -Discussed pt labwork today, 05/06/19; blood counts and chemistries are steady, Creatinine is still elevated  -Discussed 05/06/2019 MMP is in progress  -Discussed 05/06/2019 K/L light chains are in progress  -Discussed 03/04/2019 MMP shows no M Protein  present  -Discussed 03/04/2019 K/L  light chains is as follows: Kappa free light chain at 22.9, Lamda free light chains at 18.9, K/L light chain ratio at 1.21  -Advised pt that Lipitor is known to cause leg cramps and Furosemide can contribute to dehydration which could worsen cramping -Recommended that the pt continue to eat well, drink at least 48-64 oz of water each day, and walk as much as possible.   -Recommend pt to use an Chiropodist at home if okay with Dr. Tammi Klippel -Will continue to hold [place] for now and monitor with labs - continued concern for subsequent infections after COVID19 related Pneumonia -Will continue repeat 24 hour urine study and repeat ECHO every 6 months -Continue 400 mg Acyclovir once per day  -Continue Vitamin B complex  -Continue to f/u with Dr. Tammi Klippel for pulmonary function monitoring -Will see back in 2 months with labs  #4 Patient Active Problem List   Diagnosis Date Noted  . Pneumonia due to COVID-19 virus 10/29/2018  . Acute respiratory failure with hypoxia ([place]) 10/29/2018  . Thrombocytopenia ([place]) 10/29/2018  . Left lower lobe pneumonia 04/22/2018  . Sepsis due to pneumonia ([place]) 04/22/2018  . Folliculitis 48/34/7583  . AL amyloidosis ([place]) 08/21/2016  . Umbilical hernia 07/46/0029  . DIZZINESS 02/19/2010  . CAD, NATIVE VESSEL 07/21/2009  . CHEST PAIN-UNSPECIFIED 07/21/2009  . HYPERLIPIDEMIA 04/30/2007  . Essential hypertension 04/30/2007  . GASTROESOPHAGEAL REFLUX DISEASE 04/30/2007  . SEIZURE DISORDER 04/30/2007  . COMPRESSION FRACTURE, L1 VERTEBRA 04/30/2007  . DIVERTICULOSIS, COLON, HX OF 04/30/2007  . Other postprocedural status(V45.89) 04/30/2007  -Continue follow-up with primary care physician for management of other medical co-morbidities. -Recommend if pt experiences spike fever to report to hospital with transplant team    FOLLOW UP: RTC with Dr [name] with labs in 2 months  The total time spent in the appt was 20 minutes and more than 50% was on counseling  and direct patient cares.  All of the patient's questions were answered with apparent satisfaction. The patient knows to call the clinic with any problems, questions or concerns.  Sullivan Lone MD [place] AAHIVMS [facility] Hematology/Oncology Physician [facility]  (Office):       [phone] (Work cell):  [phone] (Fax):           [phone]  I, Yevette Edwards, am acting as a scribe for Dr. Sullivan Lone.   .I have reviewed the above documentation for accuracy and completeness, and I agree with the above. Brunetta Genera MD

## 2019-05-07 ENCOUNTER — Telehealth: Payer: Self-pay | Admitting: Hematology

## 2019-05-07 LAB — KAPPA/LAMBDA LIGHT CHAINS
Kappa free light chain: 25.1 mg/L — ABNORMAL HIGH (ref 3.3–19.4)
Kappa, lambda light chain ratio: 1.15 (ref 0.26–1.65)
Lambda free light chains: 21.9 mg/L (ref 5.7–26.3)

## 2019-05-07 NOTE — Telephone Encounter (Signed)
Scheduled appt per 1/11 los.  Sent a message to HIM pool to get a calendar mailed out. 

## 2019-05-09 ENCOUNTER — Ambulatory Visit (INDEPENDENT_AMBULATORY_CARE_PROVIDER_SITE_OTHER)
Admission: RE | Admit: 2019-05-09 | Discharge: 2019-05-09 | Disposition: A | Discharge status: Home / Self Care | Payer: Medicare Other | Source: Ambulatory Visit | Attending: Pulmonary Disease | Admitting: Pulmonary Disease

## 2019-05-09 ENCOUNTER — Other Ambulatory Visit: Payer: Self-pay

## 2019-05-09 DIAGNOSIS — R0602 Shortness of breath: Secondary | ICD-10-CM | POA: Diagnosis not present

## 2019-05-09 LAB — MULTIPLE MYELOMA PANEL, SERUM
Albumin SerPl Elph-Mcnc: 3.3 g/dL (ref 2.9–4.4)
Albumin/Glob SerPl: 1.2 (ref 0.7–1.7)
Alpha 1: 0.3 g/dL (ref 0.0–0.4)
Alpha2 Glob SerPl Elph-Mcnc: 1 g/dL (ref 0.4–1.0)
B-Globulin SerPl Elph-Mcnc: 0.9 g/dL (ref 0.7–1.3)
Gamma Glob SerPl Elph-Mcnc: 0.8 g/dL (ref 0.4–1.8)
Globulin, Total: 2.9 g/dL (ref 2.2–3.9)
IgA: 100 mg/dL (ref 61–437)
IgG (Immunoglobin G), Serum: 939 mg/dL (ref 603–1613)
IgM (Immunoglobulin M), Srm: 51 mg/dL (ref 15–143)
Total Protein ELP: 6.2 g/dL (ref 6.0–8.5)

## 2019-05-13 ENCOUNTER — Telehealth: Payer: Self-pay | Admitting: Pulmonary Disease

## 2019-05-13 NOTE — Telephone Encounter (Signed)
Dr. Vaughan Browner, Can you please advise on CT results. Patient is requesting results.

## 2019-05-13 NOTE — Telephone Encounter (Signed)
LMTCB x1 for pt.  

## 2019-05-14 NOTE — Telephone Encounter (Signed)
I called and discussed CT scan with patient.  Nothing further needed.

## 2019-05-24 ENCOUNTER — Ambulatory Visit (INDEPENDENT_AMBULATORY_CARE_PROVIDER_SITE_OTHER): Payer: Medicare Other | Admitting: Pulmonary Disease

## 2019-05-24 ENCOUNTER — Other Ambulatory Visit: Payer: Self-pay

## 2019-05-24 ENCOUNTER — Encounter: Payer: Self-pay | Admitting: Pulmonary Disease

## 2019-05-24 DIAGNOSIS — R0602 Shortness of breath: Secondary | ICD-10-CM

## 2019-05-24 MED ORDER — PREDNISONE 10 MG PO TABS: ORAL_TABLET | ORAL | 0 refills | Status: DC

## 2019-05-24 MED ORDER — AZITHROMYCIN 250 MG PO TABS: ORAL_TABLET | ORAL | 0 refills | Status: DC

## 2019-05-24 NOTE — Progress Notes (Signed)
Virtual Visit via Telephone Note  I connected with Alex [name]. on 05/24/19 at 11:30 AM EST by telephone and verified that I am speaking with the correct person using two identifiers.  Location: Patient: Home Provider: [facility], [place], [place]   I discussed the limitations, risks, security and privacy concerns of performing an evaluation and management service by telephone and the availability of in person appointments. I also discussed with the patient that there may be a patient responsible charge related to this service. The patient expressed understanding and agreed to proceed.   History of Present Illness: Follow-up for post COVID-19 pneumonia, pulmonary fibrosis  79 year old with past medical history of AL amyloidosis with nephrotic syndrome, hypertension, moderate nonobstructive CAD by cath 2011, aortic stenosis, GERD, hyperlipidemia, carotid artery disease.  Hospitalized from July 5 to July 25 at [facility] for COVID-19 pneumonia.He hasreceived treatment with Remdesivir 7/6-7/10, steroids for 10 days up until 7/15, received Actemra on 7/11 and plasma on 7/10.  He also received hydroxychloroquine per his request after discussion of risks and benefits. Hospital course complicated with subsegmental pulmonary embolism and he is on anticoagulation,  pneumomediastinum that was treated conservatively.  Maintained on high flow nasal cannula.  Did not need to be intubated   Observations/Objective: States that for the past 1 week has increasing nasal congestion, drainage, cough with yellow mucus.  Has mild increase in dyspnea Denies any fevers, chills  Assessment and Plan: Mild bronchitis, post COVID-19 pulmonary fibrosis We will treat with Z-Pak, prednisone taper starting at 40 mg.  Reduce dose by 10 mg every 3 days  Follow Up Instructions: 2 weeks with televisit   I discussed the assessment and treatment plan with the patient. The patient  was provided an opportunity to ask questions and all were answered. The patient agreed with the plan and demonstrated an understanding of the instructions.   The patient was advised to call back or seek an in-person evaluation if the symptoms worsen or if the condition fails to improve as anticipated.  I provided 25 minutes of non-face-to-face time during this encounter.   [name] MD [name] 05/24/2019, 11:51 AM

## 2019-05-24 NOTE — Patient Instructions (Signed)
I am sorry not feeling well We will call you a Z-Pak and a prednisone taper Start prednisone at 40 mg a day.  Reduce dose by 10 mg every 3 days. Follow-up with televisit in 2 weeks.

## 2019-05-24 NOTE — Addendum Note (Signed)
Addended by: Hildred Alamin I on: 05/24/2019 12:10 PM   Modules accepted: Orders

## 2019-06-11 ENCOUNTER — Encounter: Payer: Self-pay | Admitting: Pulmonary Disease

## 2019-06-11 ENCOUNTER — Telehealth: Payer: Self-pay | Admitting: Pulmonary Disease

## 2019-06-11 NOTE — Telephone Encounter (Signed)
06/11/2019  Please send the information below to the patient's cardiologist who can be making this decision or to Dr. Cruzita Lederer.:   We are currently trying to schedule our patient, Alex [name]., for a bronchoscopy which will require stopping any anticoagulation or antiplatelet medications prior to the procedure. The patient is currently prescribed Eliquis by your office and we would like to hold this medication prior to any planned biopsies. We will plan to resume the medication following the procedure pending no bleeding complications. If this is appropriate please let our office know by faxing a copy of this letter ASAP to the following number.   FAX NUMBER: [phone]  Please call our office with any questions or concerns.  Sincerely,   Lauraine Rinne, MSN FNP  [facility]    Is it ok to hold any anticoagulation or antiplatelet medication as described above?    Yes:    No:     (Please check yes or no)     Physician Signature:      Lauraine Rinne, NP

## 2019-06-11 NOTE — Telephone Encounter (Signed)
Attempted to fax letter to Dr. Cruzita Lederer at fax number [phone] which was found for him in the phone book with Epic but when attempting to fax it, I keep getting a reading of fax failed or fax busy. Looking further into Dr. 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  Routing back to [place].

## 2019-06-11 NOTE — Telephone Encounter (Signed)
Spoke with pt. He is to have a biopsy procedure done. His physician advised that he would need to be off Eliquis prior to the procedure. Biopsy has not been scheduled yet, they want to know how long the Eliquis should be stopped before hand. Pt made a comment about wanting to come off the Eliquis all together. Looking in his chart Eliquis was prescribed by Dr. Marzetta Board on 11/17/2018 with 1 refill. There is not any other documentation that the medication has been refilled since then.  Aaron Edelman - please advise when the Eliquis can be stopped for his biopsy.

## 2019-06-11 NOTE — Telephone Encounter (Signed)
Called and spoke with pt letting him know the info stated by Aaron Edelman for him to stop eliquis 48 hours prior to biopsy and then resume it 24 hours after and he verbalized understanding. Pt is to have prostate biopsy.   Pt asked if he could come off of the eliquis all together and stated to pt Aaron Edelman said this would need to be discussed with him by Dr. Vaughan Browner and he verbalized understanding. Nothing further needed.

## 2019-06-11 NOTE — Telephone Encounter (Signed)
Letter written and faxed to pt's cardiologist. Nothing further needed.

## 2019-06-11 NOTE — Telephone Encounter (Signed)
Please disregard my first request.  This was incorrectly written.  My apologies.  Is this in regards to the patient's prostate biopsy?Patient is currently on Eliquis for his recent history of pulmonary embolism.Our current recommendations would be that he hold the Eliquis for 48 hours prior to the biopsy patient can resume Eliquis use 24 hours after the biopsyWe will also route this message to Dr. Vaughan Browner as Juluis Rainier.Wyn Quaker, FNP

## 2019-07-08 ENCOUNTER — Inpatient Hospital Stay: Payer: Medicare Other | Attending: Hematology | Admitting: Hematology

## 2019-07-08 ENCOUNTER — Inpatient Hospital Stay: Payer: Medicare Other

## 2019-07-08 ENCOUNTER — Other Ambulatory Visit: Payer: Self-pay

## 2019-07-08 VITALS — BP 139/89 | HR 68 | Temp 98.0°F | Resp 18 | Ht 70.0 in | Wt 200.5 lb

## 2019-07-08 DIAGNOSIS — E8581 Light chain (AL) amyloidosis: Secondary | ICD-10-CM

## 2019-07-08 DIAGNOSIS — E8809 Other disorders of plasma-protein metabolism, not elsewhere classified: Secondary | ICD-10-CM

## 2019-07-08 LAB — CMP (CANCER CENTER ONLY)
ALT: 24 U/L (ref 0–44)
AST: 27 U/L (ref 15–41)
Albumin: 4.1 g/dL (ref 3.5–5.0)
Alkaline Phosphatase: 80 U/L (ref 38–126)
Anion gap: 11 (ref 5–15)
BUN: 18 mg/dL (ref 8–23)
CO2: 26 mmol/L (ref 22–32)
Calcium: 8.9 mg/dL (ref 8.9–10.3)
Chloride: 106 mmol/L (ref 98–111)
Creatinine: 1.38 mg/dL — ABNORMAL HIGH (ref 0.61–1.24)
GFR, Est AFR Am: 57 mL/min — ABNORMAL LOW (ref >60)
GFR, Estimated: 49 mL/min — ABNORMAL LOW (ref >60)
Glucose, Bld: 104 mg/dL — ABNORMAL HIGH (ref 70–99)
Potassium: 3.6 mmol/L (ref 3.5–5.1)
Sodium: 143 mmol/L (ref 135–145)
Total Bilirubin: 1.1 mg/dL (ref 0.3–1.2)
Total Protein: 7.2 g/dL (ref 6.5–8.1)

## 2019-07-08 LAB — CBC WITH DIFFERENTIAL/PLATELET
Abs Immature Granulocytes: 0.01 10*3/uL (ref 0.00–0.07)
Basophils Absolute: 0 10*3/uL (ref 0.0–0.1)
Basophils Relative: 0 %
Eosinophils Absolute: 0.1 10*3/uL (ref 0.0–0.5)
Eosinophils Relative: 1 %
HCT: 43.1 % (ref 39.0–52.0)
Hemoglobin: 14 g/dL (ref 13.0–17.0)
Immature Granulocytes: 0 %
Lymphocytes Relative: 26 %
Lymphs Abs: 1.8 10*3/uL (ref 0.7–4.0)
MCH: 28.7 pg (ref 26.0–34.0)
MCHC: 32.5 g/dL (ref 30.0–36.0)
MCV: 88.3 fL (ref 80.0–100.0)
Monocytes Absolute: 0.5 10*3/uL (ref 0.1–1.0)
Monocytes Relative: 7 %
Neutro Abs: 4.7 10*3/uL (ref 1.7–7.7)
Neutrophils Relative %: 66 %
Platelets: 168 10*3/uL (ref 150–400)
RBC: 4.88 MIL/uL (ref 4.22–5.81)
RDW: 17.2 % — ABNORMAL HIGH (ref 11.5–15.5)
WBC: 7.1 10*3/uL (ref 4.0–10.5)
nRBC: 0 % (ref 0.0–0.2)

## 2019-07-08 NOTE — Progress Notes (Signed)
HEMATOLOGY/ONCOLOGY CLINIC NOTE  Date of Service: 07/08/19   Patient Care Team: [name], MD as PCP - General (Internal Medicine) Burnell Blanks, MD as PCP - Cardiology (Cardiology) Brunetta Genera, MD as Consulting Physician (Hematology) Nephrology - Dr Madelon Lips MD Dr Henrene Pastor MD - GI  CHIEF COMPLAINTS/PURPOSE OF CONSULTATION:  AL Amyloidosis -with Nephrotic syndrome . No overt CHF pEF  HISTORY OF PRESENTING ILLNESS:   Alex Mazur. is a wonderful 79 y.o. male who has been referred to [place] by Dr .[name], MD /Dr Madelon Lips MD for evaluation and management of newly diagnosed Kidney AL Amyloidosis with nephrotic syndrome.  Patient has a history of hypertension, dyslipidemia, coronary artery disease, seizure disorder on Dilantin who was apparently in his usual state of health until 3 months ago when he started developing new onset lower extremity edema. Patient had a UA at the time that showed 4+ protein in 24-hour collection revealed 15 g of protein. Additional workup showed a creatinine of 0.8 with an albumin of 2.3 and negative SPEP and UPEP. Apparently had an elevated K/L SFLC ratio of 5.19.  He was urgently referred by his primary care physician to nephrology for additional evaluation. Patient notes no bleeding issues. No nosebleeds. No periorbital bleeding. No abnormal skin rashes. Has had significant NSAID exposure and use to take a fair amount of naproxen for chronic low back pain but has cut down on this.  Due to lack of clear etiology for his nephrotic syndrome the patient underwent a kidney biopsy on 08/06/2015 accession [id number] which showed AL amyloidosis, lambda immunophenotype, [place] red positive. Noted to have severe arteriosclerosis with moderate tubular interstitial scarring.  Patient was referred to [place] for further evaluation and treatment of his AL Amyloidosis, Patient notes that his leg swelling has improved with diuretic  therapy.  He notes no weight loss. No night sweats. No focal bone pains. No skin rashes. Lungs normal bleeding or bruising.  Patient notes that he had a lot of reading online and he and his wife had an extensive list of questions which were answered in detail.  He notes he has had some chronic issues with upper abdominal pain and nausea and that he follows with Dr. Henrene Pastor and is apparently being scheduled for an EGD and colonoscopy according to his report.  Denies having and lacks tongue. No chest pain and no overt new shortness of breath. Patient is uncertain if an echocardiogram has been ordered or scheduled.  INTERVAL HISTORY  Alex [name] is here for a scheduled follow-up for his AL amyloidosis. The patient's last visit with [place] was on 05/06/2019. The pt reports that he is doing well overall.  The pt reports that his breathing has been better and he is only using his oxygen at night. Pt's oxygen level does not drop below 90 when walking at a reasonable pace. He will be going back to see Dr. Vaughan Browner in the next few weeks and is unsure tif he will be getting additional LFTs. Pt is set to have an ECHO tomorrow. He denies any issues or symptoms with Revlimid. He is no longer taking steroids. Pt has had both doses of the COVID19 vaccine and tolerated them well.   Lab results today (07/08/19) of CBC w/diff and CMP is as follows: all values are WNL except for RDW at 17.2, Glucose at 104, Creatinine at 1.38, GFR Est Non Af Am at 49. 07/08/2019 MMP shows no M spike 07/08/2019 K/L light chains  shows normal K/L ratio  On review of systems, pt reports improved breathing and denies any other symptoms.   MEDICAL HISTORY:  Past Medical History:  Diagnosis Date  . Amyloidosis ([place])   . Arthritis   . Basal cell carcinoma   . CAD (coronary artery disease)    Lexiscan Myoview 6/14: Normal study, no scar or ischemia, EF 62%  . Cataract   . Chronic kidney disease   . Colon polyp   . Coronary artery  disease    moderate disease by cath 2011  . Diverticulosis of colon   . GERD (gastroesophageal reflux disease)   . Hearing loss   . Heart disease   . Hiatal hernia   . Hyperlipidemia   . Hypertension   . MRSA (methicillin resistant staph aureus) culture positive   . Seizure disorder ([place])   . Seizures ([place])    last in 1970s  Obstructive sleep apnea Gastroesophageal reflux disease Aortic stenosis Erectile dysfunction Peripheral neuropathy Seizure disorder   SURGICAL HISTORY: Past Surgical History:  Procedure Laterality Date  . BUNIONECTOMY    . CARPAL TUNNEL RELEASE  1994   left  . CERVICAL FUSION    . compression fracture    . FOOT SURGERY     right -twice, left foot once  . HERNIA REPAIR     Umbilical with PVP  . KNEE ARTHROSCOPY     left knee  . [place]   left - scope  . NASAL SEPTUM SURGERY    . NECK SURGERY    . NOSE SURGERY     twice  . PAROTIDECTOMY Right 03/09/2017  . ROTATOR CUFF REPAIR     twice, both shoulders  . ROTATOR CUFF REPAIR    . SHOULDER SURGERY    . SKIN BIOPSY    . SPINE SURGERY     C2, C3, C4  . TRIGGER FINGER RELEASE     both hands, twice  . TRIGGER FINGER RELEASE    . VASECTOMY      SOCIAL HISTORY: Social History   Socioeconomic History  . Marital status: Married    Spouse name: Not on file  . Number of children: 2  . Years of education: 94  . Highest education level: Not on file  Occupational History  . Occupation: Retired  Tobacco Use  . Smoking status: Former Smoker    Packs/day: 3.00    Years: 20.00    Pack years: 60.00    Types: Cigarettes    Quit date: 04/25/1969    Years since quitting: 50.2  . Smokeless tobacco: Former Network engineer and Sexual Activity  . Alcohol use: Yes    Alcohol/week: 7.0 - 14.0 standard drinks    Types: 7 - 14 Glasses of wine per week    Comment: socially shots of wiskey  . Drug use: No  . Sexual activity: Not on file  Other Topics Concern  . Not on file  Social History  Narrative   ** Merged History Encounter **   Lives at home w/ his wife   Right-handed   Caffeine: occasional Pepsi       Social Determinants of Health   Financial Resource Strain:   . Difficulty of Paying Living Expenses:   Food Insecurity:   . Worried About Charity fundraiser in the Last Year:   . Arboriculturist in the Last Year:   Transportation Needs:   . Film/video editor (Medical):   [name] Kitchen Lack  of Transportation (Non-Medical):   Physical Activity:   . Days of Exercise per Week:   . Minutes of Exercise per Session:   Stress:   . Feeling of Stress :   Social Connections:   . Frequency of Communication with Friends and Family:   . Frequency of Social Gatherings with Friends and Family:   . Attends Religious Services:   . Active Member of Clubs or Organizations:   . Attends Archivist Meetings:   [name] Kitchen Marital Status:   Intimate Partner Violence:   . Fear of Current or Ex-Partner:   . Emotionally Abused:   [name] Kitchen Physically Abused:   . Sexually Abused:   Patient is currently retired.   FAMILY HISTORY: Family History  Problem Relation Age of Onset  . Diabetes Father   . Heart disease Father   . Heart attack Father 79  . Hypertension Father   . Cancer Sister   . Hearing loss Mother   . Eczema Sister   . Hyperlipidemia Other   . Diabetes Other   . Hypertension Other   . Seizures Other   . Thyroid disease Other   . Colon cancer Neg Hx   . Stroke Neg Hx     ALLERGIES:  is allergic to ramipril.  MEDICATIONS:  Current Outpatient Medications  Medication Sig Dispense Refill  . acyclovir (ZOVIRAX) 400 MG tablet Take 1 tablet (400 mg total) by mouth 2 (two) times daily. 60 tablet 2  . albuterol (VENTOLIN HFA) 108 (90 Base) MCG/ACT inhaler Inhale 2 puffs into the lungs every 4 (four) hours as needed for shortness of breath or wheezing.    [name] Kitchen apixaban (ELIQUIS) 5 MG TABS tablet Take 1 tablet (5 mg total) by mouth 2 (two) times daily. 60 tablet 1  . Ascorbic Acid  (VITAMIN C) 1000 MG tablet Take 1,000 mg by mouth 2 (two) times a day.     [name] Kitchen aspirin EC 81 MG tablet Take 81 mg by mouth daily.    [name] Kitchen atorvastatin (LIPITOR) 40 MG tablet Take 40 mg by mouth at bedtime.     [name] Kitchen azithromycin (ZITHROMAX) 250 MG tablet Take 2 tabs on day one, and then one daily until gone 6 tablet 0  . b complex vitamins capsule Take 1 capsule by mouth daily.    . budesonide-formoterol (SYMBICORT) 160-4.5 MCG/ACT inhaler Inhale 2 puffs into the lungs 2 (two) times daily. 1 Inhaler 0  . Cholecalciferol (D3-1000) 25 MCG (1000 UT) tablet Take 1,000 Units by mouth 2 (two) times a day.     . ezetimibe (ZETIA) 10 MG tablet Take 10 mg by mouth daily.     . folic acid (FOLVITE) 1 MG tablet Take 1 mg by mouth at bedtime.     . furosemide (LASIX) 40 MG tablet Take 40 mg by mouth daily.    . isosorbide mononitrate (IMDUR) 15 mg TB24 24 hr tablet Take 15 mg by mouth daily.    . Multiple Vitamin (MULTI-VITAMINS) TABS Take 1 tablet by mouth daily.    . mupirocin ointment (BACTROBAN) 2 % Apply 1 application topically as needed (rash).     . nitroGLYCERIN (NITROSTAT) 0.4 MG SL tablet Place 0.4 mg under the tongue every 5 (five) minutes as needed for chest pain.    . pantoprazole (PROTONIX) 40 MG tablet Take 80 mg by mouth daily.     . potassium chloride SA (KLOR-CON) 20 MEQ tablet Take 20 mEq by mouth daily.    . predniSONE (DELTASONE) 10 MG  tablet Take 4 tabs for 3 days, then 3 tabs for 3 days, then 2 tabs for 3 days, then 1 tab for 3 days, then stop 30 tablet 0  . triamcinolone cream (KENALOG) 0.1 % Apply 1 application topically as needed for rash.    . vitamin B-12 (CYANOCOBALAMIN) 500 MCG tablet Take 500 mcg by mouth 2 (two) times daily.    . Zinc 50 MG TABS Take 1 tablet by mouth daily.     No current facility-administered medications for this visit.    REVIEW OF SYSTEMS:   A 10+ POINT REVIEW OF SYSTEMS WAS OBTAINED including neurology, dermatology, psychiatry, cardiac, respiratory, lymph,  extremities, GI, GU, Musculoskeletal, constitutional, breasts, reproductive, HEENT.  All pertinent positives are noted in the HPI.  All others are negative.   PHYSICAL EXAMINATION: ECOG PERFORMANCE STATUS: 1 - Symptomatic but completely ambulatory  Vitals:   07/08/19 1503  BP: 139/89  Pulse: 68  Resp: 18  Temp: 98 F (36.7 C)  SpO2: 97%   Filed Weights   07/08/19 1503  Weight: 200 lb 8 oz (90.9 kg)   .Body mass index is 28.77 kg/m.   Exam was given in a chair   GENERAL:alert, in no acute distress and comfortable SKIN: no acute rashes, no significant lesions EYES: conjunctiva are pink and non-injected, sclera anicteric OROPHARYNX: MMM, no exudates, no oropharyngeal erythema or ulceration NECK: supple, no JVD LYMPH:  no palpable lymphadenopathy in the cervical, axillary or inguinal regions LUNGS: clear to auscultation b/l with normal respiratory effort HEART: regular rate & rhythm ABDOMEN:  normoactive bowel sounds , non tender, not distended. No palpable hepatosplenomegaly.  Extremity: no pedal edema PSYCH: alert & oriented x 3 with fluent speech NEURO: no focal motor/sensory deficits  LABORATORY DATA:  I have reviewed the data as listed  . CBC Latest Ref Rng & Units 07/08/2019 05/06/2019 03/04/2019  WBC 4.0 - 10.5 K/uL 7.1 7.6 7.2  Hemoglobin 13.0 - 17.0 g/dL 14.0 13.1 12.1(L)  Hematocrit 39.0 - 52.0 % 43.1 40.4 37.9(L)  Platelets 150 - 400 K/uL 168 189 162    CMP Latest Ref Rng & Units 07/08/2019 05/06/2019 03/04/2019  Glucose 70 - 99 mg/dL 104(H) 132(H) 154(H)  BUN 8 - 23 mg/dL '18 16 17  ' Creatinine 0.61 - 1.24 mg/dL 1.38(H) 1.48(H) 1.62(H)  Sodium 135 - 145 mmol/L 143 142 141  Potassium 3.5 - 5.1 mmol/L 3.6 3.6 3.5  Chloride 98 - 111 mmol/L 106 108 105  CO2 22 - 32 mmol/L '26 24 25  ' Calcium 8.9 - 10.3 mg/dL 8.9 8.8(L) 8.7(L)  Total Protein 6.5 - 8.1 g/dL 7.2 6.9 7.0  Total Bilirubin 0.3 - 1.2 mg/dL 1.1 0.8 1.2  Alkaline Phos 38 - 126 U/L 80 92 74  AST 15 - 41 U/L  '27 18 20  ' ALT 0 - 44 U/L '24 13 17        ' RADIOGRAPHIC STUDIES: I have personally reviewed the radiological images as listed and agreed with the findings in the report. No results found. Result status: Edited Result - FINAL                              *[facility]*  [address]                        [place], [place] [address]                            [phone]  ------------------------------------------------------------------- Transthoracic Echocardiography  (Report amended )  Patient:    Alex [name], Alex [name] Alex #:       [id number] Study Date: 08/24/2016 Gender:     M Age:        32 Height:     180.3 cm Weight:     90.6 kg BSA:        2.15 m^2 Pt. Status: Room:   ATTENDING    Darlina Guys, MD  Willia Craze, Christopher  REFERRING    [place], [place], Outpatient  SONOGRAPHER  Roseanna Rainbow  cc:  ------------------------------------------------------------------- LV EF: 65% -   70%  ------------------------------------------------------------------- Indications:      Aortic stenosis 424.1.  ------------------------------------------------------------------- History:   PMH:  Amyloidosis.  Coronary artery disease.  Angina pectoris.  Risk factors:  Hypertension. Dyslipidemia.  ------------------------------------------------------------------- Study Conclusions  - Left ventricle: The cavity size was normal. Wall thickness was   increased in a pattern of mild LVH. Systolic function was   vigorous. The estimated ejection fraction was in the range of 65%   to 70%. Wall motion was normal; there were no regional wall   motion abnormalities. Doppler parameters are consistent with   abnormal left ventricular relaxation (grade 1 diastolic   dysfunction). - Aortic valve: Valve mobility was mildly restricted. - Aortic root: The aortic root was mildly  dilated. - Mitral valve: Calcified annulus.  Impressions:  - Vigorous LV systolic function; mild diastolic dysfunction; mild   LVH; calcified aortic valve with no significant AS (peak velocity   2 m/s; mean gradient 9 mmHg); mildly dilated aortic root.    ASSESSMENT & PLAN:   79 y.o.  male with above-mentioned multiple medical comorbidities with  #1 Biopsy proven Renal and Systemic AL Amyloidosis Initial BM Bx shows 9% plasma cells and amyloid deposits. Echo shows normal systolic function with only grade 1 diastolic dysfunction. Skeletal survey with no lytic lesions suggestive of multiple myeloma. Serum K/L ratio increased at about 6.22 on diagnosis and has now improved to 1.5 (wnl with normal kappa lambda serum free light chain ratio ) UPEP shows 24h protein improved from 15g to 5g daily 05/29/18 24hour UPEP no M spike   01/12/18 ECHO revealed LV EF of 60-65%   #2  Nephrotic Syndrome - likely related to AL amyloidosis.  Urine Study 07/26/17 showed Protein at 2.232g.  -07/19/17 the pt had a BM Bx exam which revealed Normocellular bone marrow (30-40%) with 2-3% plasma cells and focal amyloid deposition. UPEP 05/29/2018 - showed no M spike and total urinary protein of 128m/24h -- resolution of nephrotic syndrome.   PLAN: -Discussed pt labwork today, 07/08/19; no anemia, kidney function is stable, other blood counts and chemistries are stable   -Discussed 07/08/2019 MMP is in progress, K/L light chains are in progress -Discussed 05/06/2019 MMP shows M Protein is not observed -Discussed 05/06/2019 K/L light ratio is WNL -Will continue to hold Revlimid to allow for full recovery from [place]. The risk of infection is greater than the benefit at this time.  -Continue Vitamin B complex  -Continue to f/u with Dr. MVaughan Brownerfor pulmonary  function monitoring -Recommend pt f/u as scheduled for ECHO tomorrow -Will see back in 9 weeks, with labs one week prior  #4 Patient Active  Problem List   Diagnosis Date Noted  . Pneumonia due to COVID-19 virus 10/29/2018  . Acute respiratory failure with hypoxia ([place]) 10/29/2018  . Thrombocytopenia ([place]) 10/29/2018  . Left lower lobe pneumonia 04/22/2018  . Sepsis due to pneumonia ([place]) 04/22/2018  . Folliculitis 09/16/9100  . AL amyloidosis ([place]) 08/21/2016  . Umbilical hernia 89/05/2838  . DIZZINESS 02/19/2010  . CAD, NATIVE VESSEL 07/21/2009  . CHEST PAIN-UNSPECIFIED 07/21/2009  . HYPERLIPIDEMIA 04/30/2007  . Essential hypertension 04/30/2007  . GASTROESOPHAGEAL REFLUX DISEASE 04/30/2007  . SEIZURE DISORDER 04/30/2007  . COMPRESSION FRACTURE, L1 VERTEBRA 04/30/2007  . DIVERTICULOSIS, COLON, HX OF 04/30/2007  . Other postprocedural status(V45.89) 04/30/2007  -Continue follow-up with primary care physician for management of other medical co-morbidities. -Recommend if pt experiences spike fever to report to hospital with transplant team    FOLLOW UP: RTC with Dr [name] in 9 weeks Labs 1 week prior to visit in 8 weeks   The total time spent in the appt was 25 minutes and more than 50% was on counseling and direct patient cares.  All of the patient's questions were answered with apparent satisfaction. The patient knows to call the clinic with any problems, questions or concerns.   Sullivan Lone MD [place] AAHIVMS [facility] Hematology/Oncology Physician [facility]  (Office):       [phone] (Work cell):  [phone] (Fax):           [phone]  I, Yevette Edwards, am acting as a scribe for Dr. Sullivan Lone.   .I have reviewed the above documentation for accuracy and completeness, and I agree with the above. Brunetta Genera MD

## 2019-07-09 ENCOUNTER — Other Ambulatory Visit (HOSPITAL_COMMUNITY): Payer: Medicare HMO

## 2019-07-09 ENCOUNTER — Ambulatory Visit (HOSPITAL_COMMUNITY): Payer: Medicare Other | Attending: Cardiology

## 2019-07-09 DIAGNOSIS — I350 Nonrheumatic aortic (valve) stenosis: Secondary | ICD-10-CM | POA: Diagnosis not present

## 2019-07-09 LAB — KAPPA/LAMBDA LIGHT CHAINS
Kappa free light chain: 22.1 mg/L — ABNORMAL HIGH (ref 3.3–19.4)
Kappa, lambda light chain ratio: 0.95 (ref 0.26–1.65)
Lambda free light chains: 23.2 mg/L (ref 5.7–26.3)

## 2019-07-10 ENCOUNTER — Telehealth: Payer: Self-pay | Admitting: Hematology

## 2019-07-10 NOTE — Telephone Encounter (Signed)
Scheduled 03/15 los, patient has been called and notified.

## 2019-07-11 LAB — MULTIPLE MYELOMA PANEL, SERUM
Albumin SerPl Elph-Mcnc: 3.7 g/dL (ref 2.9–4.4)
Albumin/Glob SerPl: 1.2 (ref 0.7–1.7)
Alpha 1: 0.3 g/dL (ref 0.0–0.4)
Alpha2 Glob SerPl Elph-Mcnc: 0.9 g/dL (ref 0.4–1.0)
B-Globulin SerPl Elph-Mcnc: 1 g/dL (ref 0.7–1.3)
Gamma Glob SerPl Elph-Mcnc: 0.9 g/dL (ref 0.4–1.8)
Globulin, Total: 3.1 g/dL (ref 2.2–3.9)
IgA: 101 mg/dL (ref 61–437)
IgG (Immunoglobin G), Serum: 934 mg/dL (ref 603–1613)
IgM (Immunoglobulin M), Srm: 70 mg/dL (ref 15–143)
Total Protein ELP: 6.8 g/dL (ref 6.0–8.5)

## 2019-08-05 NOTE — Progress Notes (Signed)
GU Location of Tumor / Histology: prostatic adenocarcinoma  If Prostate Cancer, Gleason Score is (3 + 4) and PSA is (11.90). Prostate volume: 23.11 g.  Alex [name]. was noted to have an elevated PSA of 5.52 in March 2020. Repeat PSA in June 2020 was even higher at 5.7. DRE revealed a firm nodule in the lateral aspect of the right lobe thus biopsy was recommended.   Biopsies of prostate (if applicable) revealed:    Past/Anticipated interventions by urology, if any: prostate biopsy, referral to Dr. Tammi Klippel for consideration of radiotherapy  Past/Anticipated interventions by medical oncology, if any: Yes, Dr. [name] manages his amyloidosis with nephrotic syndrome dx in 2018. Patient evaluated last by Dr. [name] in 07/08/2019  Weight changes, if any: Yes, due to hospitalization for covid 19. Reports he lost approximately 25 lb.  Bowel/Bladder complaints, if any: IPSS 1 due to nocturia. SHIM 1. Patient denies dysuria, hematuria or urinary leakage. Patient denies any bowel complaints.    Nausea/Vomiting, if any: no  Pain issues, if any:  denies  SAFETY ISSUES:  Prior radiation? denies  Pacemaker/ICD? denies  Possible current pregnancy? no, male patient  Is the patient on methotrexate? no  Current Complaints / other details:  79 year old male. Married with two children. Retired. Former smoker quit in 1971. Patient has received both covid vaccines. Patient reports that one of his sister's had cancer twenty years ago but he is unsure of what type it was. He reports she is alive and doing well now.

## 2019-08-06 ENCOUNTER — Ambulatory Visit
Admission: RE | Admit: 2019-08-06 | Discharge: 2019-08-06 | Disposition: A | Discharge status: Home / Self Care | Payer: Medicare Other | Source: Ambulatory Visit | Attending: Radiation Oncology | Admitting: Radiation Oncology

## 2019-08-06 ENCOUNTER — Other Ambulatory Visit: Payer: Self-pay

## 2019-08-06 ENCOUNTER — Encounter: Payer: Self-pay | Admitting: Radiation Oncology

## 2019-08-06 VITALS — Ht 71.0 in | Wt 200.0 lb

## 2019-08-06 DIAGNOSIS — C61 Malignant neoplasm of prostate: Secondary | ICD-10-CM | POA: Insufficient documentation

## 2019-08-06 HISTORY — DX: Malignant neoplasm of prostate: C61

## 2019-08-06 NOTE — Progress Notes (Signed)
See progress note under physician encounter. 

## 2019-08-06 NOTE — Progress Notes (Signed)
Radiation Oncology         [phone] ________________________________  Initial Outpatient Consultation - Conducted via Telephone due to current COVID-19 concerns for limiting patient exposure  Name: Alex [name]. MRN: [id number]  Date: 08/06/2019  DOB: December 11, 1940  UQ:JFHL, Alex Saxon, MD  [name], MD   REFERRING PHYSICIAN: [name], MD  DIAGNOSIS: 79 y.o. gentleman with Stage T2a adenocarcinoma of the prostate with Gleason score of 3+4, and PSA of 11.9.    ICD-10-CM   1. Malignant neoplasm of prostate ([place])  C61     HISTORY OF PRESENT ILLNESS: Alex [name]. is a 79 y.o. male with a diagnosis of prostate cancer.  He was noted to have an elevated PSA of 5.52 by his primary care physician, Dr. [name].  When it remained elevated at 5.7 on repeat testing in 09/2018, he was referred for evaluation in urology by Dr. Karsten Ro on 12/18/2018.  However, he had been hospitalized with Covid-19 in early 10/2018 and developed a pulmonary embolus.  He was started on Eliquis for at least 6 months and has not been advised by his pulmonologist as to when/if he will be able to discontinue its use.  At the time of his initial evaluation with Dr. Karsten Ro in 11/2018, his DRE was unremarkable.  The decision was to contiue to closely monitor the PSA and this remained stable at 5.77 on repeat testing in 02/2019 but increased to 11.90 on 06/05/2019.  He returned to Dr. Karsten Ro on 06/11/2019,  digital rectal examination was performed at that time revealing a firm ridge along lateral aspect of right lobe without palpable extension. The patient proceeded to transrectal ultrasound with 12 biopsies of the prostate on 07/09/2019.  The prostate volume measured 23.11 cc.  Out of 12 core biopsies, 1 was positive.  The maximum Gleason score was 3+4, and this was seen in the right base lateral.  The patient reviewed the biopsy results with his urologist and he has kindly been referred today for discussion of  potential radiation treatment options.  Of note, he is an established patient of Dr. [name], followed for amyloidosis with nephrotic syndrome, which he was diagnosed with in 2018. They last met on 07/12/2019.  PREVIOUS RADIATION THERAPY: No  PAST MEDICAL HISTORY:  Past Medical History:  Diagnosis Date  . Amyloidosis ([place])   . Arthritis   . Basal cell carcinoma   . CAD (coronary artery disease)    Lexiscan Myoview 6/14: Normal study, no scar or ischemia, EF 62%  . Cataract   . Chronic kidney disease   . Colon polyp   . Coronary artery disease    moderate disease by cath 2011  . Diverticulosis of colon   . GERD (gastroesophageal reflux disease)   . Hearing loss   . Heart disease   . Hiatal hernia   . Hyperlipidemia   . Hypertension   . MRSA (methicillin resistant staph aureus) culture positive   . Prostate cancer ([place])   . Seizure disorder ([place])   . Seizures ([place])    last in 1970s      PAST SURGICAL HISTORY: Past Surgical History:  Procedure Laterality Date  . BUNIONECTOMY    . CARPAL TUNNEL RELEASE  1994   left  . CERVICAL FUSION    . compression fracture    . FOOT SURGERY     right -twice, left foot once  . HERNIA REPAIR     Umbilical with PVP  . KNEE ARTHROSCOPY  left knee  . Britt   left - scope  . NASAL SEPTUM SURGERY    . NECK SURGERY    . NOSE SURGERY     twice  . PAROTIDECTOMY Right 03/09/2017  . ROTATOR CUFF REPAIR     twice, both shoulders  . ROTATOR CUFF REPAIR    . SHOULDER SURGERY    . SKIN BIOPSY    . SPINE SURGERY     C2, C3, C4  . TRIGGER FINGER RELEASE     both hands, twice  . TRIGGER FINGER RELEASE    . VASECTOMY      FAMILY HISTORY:  Family History  Problem Relation Age of Onset  . Diabetes Father   . Heart disease Father   . Heart attack Father 42  . Hypertension Father   . Hearing loss Mother   . Eczema Sister   . Cancer Sister        1 sister had cancer but patient unsure of type  . Hyperlipidemia Other    . Diabetes Other   . Hypertension Other   . Seizures Other   . Thyroid disease Other   . Colon cancer Neg Hx   . Stroke Neg Hx   . Breast cancer Neg Hx   . Pancreatic cancer Neg Hx   . Prostate cancer Neg Hx     SOCIAL HISTORY:  Social History   Socioeconomic History  . Marital status: Married    Spouse name: Not on file  . Number of children: 2  . Years of education: 36  . Highest education level: Not on file  Occupational History  . Occupation: Retired  Tobacco Use  . Smoking status: Former Smoker    Packs/day: 3.00    Years: 20.00    Pack years: 60.00    Types: Cigarettes    Quit date: 04/25/1969    Years since quitting: 50.3  . Smokeless tobacco: Former Network engineer and Sexual Activity  . Alcohol use: Yes    Alcohol/week: 7.0 - 14.0 standard drinks    Types: 7 - 14 Glasses of wine per week    Comment: socially shots of wiskey  . Drug use: No  . Sexual activity: Not Currently    Comment: SHIM 1. Reports erectile dysfunction.  Other Topics Concern  . Not on file  Social History Narrative   ** Merged History Encounter **   Lives at home w/ his wife   Right-handed   Caffeine: occasional Pepsi       Social Determinants of Health   Financial Resource Strain:   . Difficulty of Paying Living Expenses:   Food Insecurity:   . Worried About Charity fundraiser in the Last Year:   . Arboriculturist in the Last Year:   Transportation Needs:   . Film/video editor (Medical):   Alex [name] Lack of Transportation (Non-Medical):   Physical Activity:   . Days of Exercise per Week:   . Minutes of Exercise per Session:   Stress:   . Feeling of Stress :   Social Connections:   . Frequency of Communication with Friends and Family:   . Frequency of Social Gatherings with Friends and Family:   . Attends Religious Services:   . Active Member of Clubs or Organizations:   . Attends Archivist Meetings:   Alex [name] Marital Status:   Intimate Partner Violence:   . Fear of  Current or Ex-Partner:   . Emotionally  Abused:   Alex [name] Physically Abused:   . Sexually Abused:     ALLERGIES: Ramipril  MEDICATIONS:  Current Outpatient Medications  Medication Sig Dispense Refill  . albuterol (VENTOLIN HFA) 108 (90 Base) MCG/ACT inhaler Inhale 2 puffs into the lungs every 4 (four) hours as needed for shortness of breath or wheezing.    Alex [name] apixaban (ELIQUIS) 5 MG TABS tablet Take 1 tablet (5 mg total) by mouth 2 (two) times daily. 60 tablet 1  . Ascorbic Acid (VITAMIN C) 1000 MG tablet Take 1,000 mg by mouth 2 (two) times a day.     Alex [name] aspirin EC 81 MG tablet Take 81 mg by mouth daily.    Alex [name] atorvastatin (LIPITOR) 40 MG tablet Take 40 mg by mouth at bedtime.     Alex [name] b complex vitamins capsule Take 1 capsule by mouth daily.    . budesonide-formoterol (SYMBICORT) 160-4.5 MCG/ACT inhaler Inhale 2 puffs into the lungs 2 (two) times daily. 1 Inhaler 0  . Cholecalciferol (D3-1000) 25 MCG (1000 UT) tablet Take 1,000 Units by mouth 2 (two) times a day.     . ezetimibe (ZETIA) 10 MG tablet Take 10 mg by mouth daily.     . folic acid (FOLVITE) 1 MG tablet Take 1 mg by mouth at bedtime.     . furosemide (LASIX) 40 MG tablet Take 40 mg by mouth daily.    . isosorbide mononitrate (IMDUR) 15 mg TB24 24 hr tablet Take 15 mg by mouth daily.    . Multiple Vitamin (MULTI-VITAMINS) TABS Take 1 tablet by mouth daily.    . mupirocin ointment (BACTROBAN) 2 % Apply 1 application topically as needed (rash).     . nitroGLYCERIN (NITROSTAT) 0.4 MG SL tablet Place 0.4 mg under the tongue every 5 (five) minutes as needed for chest pain.    . pantoprazole (PROTONIX) 40 MG tablet Take 80 mg by mouth daily.     . potassium chloride SA (KLOR-CON) 20 MEQ tablet Take 20 mEq by mouth daily.    . vitamin B-12 (CYANOCOBALAMIN) 500 MCG tablet Take 500 mcg by mouth 2 (two) times daily.    . Zinc 50 MG TABS Take 1 tablet by mouth daily.     No current facility-administered medications for this encounter.    REVIEW  OF SYSTEMS:  On review of systems, the patient reports that he is doing well overall. He denies any chest pain, shortness of breath, cough, fevers, chills, night sweats, unintended weight changes. He denies any bowel disturbances, and denies abdominal pain, nausea or vomiting. He denies any new musculoskeletal or joint aches or pains. His IPSS was 1, indicating mild urinary symptoms. His SHIM was 1, indicating he has severe erectile dysfunction. A complete review of systems is obtained and is otherwise negative.    PHYSICAL EXAM:  Wt Readings from Last 3 Encounters:  08/06/19 200 lb (90.7 kg)  07/08/19 200 lb 8 oz (90.9 kg)  05/06/19 199 lb 8 oz (90.5 kg)   Temp Readings from Last 3 Encounters:  07/08/19 98 F (36.7 C) (Temporal)  05/06/19 98 F (36.7 C) (Temporal)  04/10/19 (!) 97 F (36.1 C) (Temporal)   BP Readings from Last 3 Encounters:  07/08/19 139/89  05/06/19 113/67  04/10/19 122/80   Pulse Readings from Last 3 Encounters:  07/08/19 68  05/06/19 86  04/10/19 70   Pain Assessment Pain Score: 0-No pain/10  Unable to assess due to telephone consult visit format.  KPS = 90  100 -  Normal; no complaints; no evidence of disease. 90   - Able to carry on normal activity; minor signs or symptoms of disease. 80   - Normal activity with effort; some signs or symptoms of disease. 75   - Cares for self; unable to carry on normal activity or to do active work. 60   - Requires occasional assistance, but is able to care for most of his personal needs. 50   - Requires considerable assistance and frequent medical care. 11   - Disabled; requires special care and assistance. 7   - Severely disabled; hospital admission is indicated although death not imminent. 86   - Very sick; hospital admission necessary; active supportive treatment necessary. 10   - Moribund; fatal processes progressing rapidly. 0     - Dead  Karnofsky DA, Abelmann [place], Craver LS and [facility] [phone]) The use of  the nitrogen mustards in the palliative treatment of carcinoma: with particular reference to bronchogenic carcinoma Cancer 1 634-56  LABORATORY DATA:  Lab Results  Component Value Date   WBC 7.1 07/08/2019   HGB 14.0 07/08/2019   HCT 43.1 07/08/2019   MCV 88.3 07/08/2019   PLT 168 07/08/2019   Lab Results  Component Value Date   NA 143 07/08/2019   K 3.6 07/08/2019   CL 106 07/08/2019   CO2 26 07/08/2019   Lab Results  Component Value Date   ALT 24 07/08/2019   AST 27 07/08/2019   ALKPHOS 80 07/08/2019   BILITOT 1.1 07/08/2019     RADIOGRAPHY: ECHOCARDIOGRAM COMPLETE  Result Date: 07/09/2019    ECHOCARDIOGRAM REPORT   Patient Name:   Lonnel Gjerde. Date of Exam: 07/09/2019 Medical Rec #:  [id number]             Height:       70.0 in Accession #:    [id number]            Weight:       200.5 lb Date of Birth:  [birth year]             BSA:          2.090 m Patient Age:    22 years              BP:           113/67 mmHg Patient Gender: M                     HR:           90 bpm. Exam Location:  [facility] Procedure: 2D Echo, Cardiac Doppler and Color Doppler Indications:    I35 Aortic stenosis  History:        Patient has prior history of Echocardiogram examinations, most                 recent 11/08/2018. CAD, Aortic Valve Disease; Risk                 Factors:Hypertension, Dyslipidemia and Former Smoker. COVID-19+                 10/28/18.  Sonographer:    Jessee Avers, RDCS Referring Phys: [place]  1. Left ventricular ejection fraction, by estimation, is 60 to 65%. The left ventricle has normal function. The left ventricle has no regional wall motion abnormalities. There is mild left ventricular hypertrophy. Left ventricular diastolic parameters are consistent with Grade I diastolic dysfunction (  impaired relaxation).  2. Right ventricular systolic function is normal. The right ventricular size is normal.  3. The mitral valve is normal in structure. No  evidence of mitral valve regurgitation. No evidence of mitral stenosis.  4. The aortic valve is normal in structure. Aortic valve regurgitation is not visualized. Mild aortic valve stenosis. Aortic valve area, by VTI measures 1.50 cm. Aortic valve mean gradient measures 11.4 mmHg. Aortic valve Vmax measures 2.26 m/s.  5. Aortic dilatation noted. There is mild dilatation of the ascending aorta measuring 39 mm.  6. The inferior vena cava is normal in size with greater than 50% respiratory variability, suggesting right atrial pressure of 3 mmHg. Comparison(s): 11/08/18 EF 60-65%. Mild-moderate AS 84mHg mean, 35mg peak. FINDINGS  Left Ventricle: Left ventricular ejection fraction, by estimation, is 60 to 65%. The left ventricle has normal function. The left ventricle has no regional wall motion abnormalities. The left ventricular internal cavity size was normal in size. There is  mild left ventricular hypertrophy. Left ventricular diastolic parameters are consistent with Grade I diastolic dysfunction (impaired relaxation). Right Ventricle: The right ventricular size is normal. No increase in right ventricular wall thickness. Right ventricular systolic function is normal. Left Atrium: Left atrial size was normal in size. Right Atrium: Right atrial size was normal in size. Pericardium: There is no evidence of pericardial effusion. Mitral Valve: The mitral valve is normal in structure. Normal mobility of the mitral valve leaflets. No evidence of mitral valve regurgitation. No evidence of mitral valve stenosis. Tricuspid Valve: The tricuspid valve is normal in structure. Tricuspid valve regurgitation is not demonstrated. No evidence of tricuspid stenosis. Aortic Valve: The aortic valve is normal in structure.. There is moderate thickening and moderate calcification of the aortic valve. Aortic valve regurgitation is not visualized. Mild aortic stenosis is present. There is moderate thickening of the aortic  valve. There  is moderate calcification of the aortic valve. Aortic valve mean gradient measures 11.4 mmHg. Aortic valve peak gradient measures 20.5 mmHg. Aortic valve area, by VTI measures 1.50 cm. Pulmonic Valve: The pulmonic valve was normal in structure. Pulmonic valve regurgitation is not visualized. No evidence of pulmonic stenosis. Aorta: Aortic dilatation noted. There is mild dilatation of the ascending aorta measuring 39 mm. Venous: The inferior vena cava is normal in size with greater than 50% respiratory variability, suggesting right atrial pressure of 3 mmHg. IAS/Shunts: No atrial level shunt detected by color flow Doppler.  LEFT VENTRICLE PLAX 2D LVIDd:         4.10 cm  Diastology LVIDs:         2.80 cm  LV e' lateral:   6.96 cm/s LV PW:         1.00 cm  LV E/e' lateral: 9.0 LV IVS:        1.10 cm  LV e' medial:    7.29 cm/s LVOT diam:     2.35 cm  LV E/e' medial:  8.6 LV SV:         62 LV SV Index:   30 LVOT Area:     4.34 cm  RIGHT VENTRICLE RV S prime:     11.90 cm/s TAPSE (M-mode): 2.1 cm LEFT ATRIUM             Index       RIGHT ATRIUM LA diam:        3.20 cm 1.53 cm/m  RA Pressure: 3.00 mmHg LA Vol (A2C):   33.6 ml 16.08 ml/m LA Vol (A4C):  44.3 ml 21.20 ml/m LA Biplane Vol: 39.3 ml 18.81 ml/m  AORTIC VALVE AV Area (Vmax):    1.40 cm AV Area (Vmean):   1.27 cm AV Area (VTI):     1.50 cm AV Vmax:           226.20 cm/s AV Vmean:          155.400 cm/s AV VTI:            0.412 m AV Peak Grad:      20.5 mmHg AV Mean Grad:      11.4 mmHg LVOT Vmax:         72.80 cm/s LVOT Vmean:        45.450 cm/s LVOT VTI:          0.143 m LVOT/AV VTI ratio: 0.35  AORTA Ao Root diam: 3.50 cm Ao Asc diam:  3.90 cm MITRAL VALVE               TRICUSPID VALVE                            Estimated RAP:  3.00 mmHg  MV E velocity: 62.60 cm/s  SHUNTS MV A velocity: 92.50 cm/s  Systemic VTI:  0.14 m MV E/A ratio:  0.68        Systemic Diam: 2.35 cm Candee Furbish MD Electronically signed by Candee Furbish MD Signature Date/Time:  07/09/2019/2:12:14 PM    Final       IMPRESSION/PLAN: This visit was conducted via Telephone to spare the patient unnecessary potential exposure in the healthcare setting during the current COVID-19 pandemic. 1. 79 y.o. gentleman with Stage T2a adenocarcinoma of the prostate with Gleason Score of 3+4, and PSA of 11.9. We discussed the patient's workup and outlined the nature of prostate cancer in this setting. The patient's T stage, Gleason's score, and PSA put him into the favorable intermediate risk group. Accordingly, he is eligible for a variety of potential treatment options including brachytherapy, 5.5 weeks of external radiation or prostatectomy. We discussed the available radiation techniques, and focused on the details and logistics and delivery. We discussed and outlined the risks, benefits, short and long-term effects associated with radiotherapy and compared and contrasted these with prostatectomy. We discussed the role of SpaceOAR in reducing the rectal toxicity associated with radiotherapy.  He and his wife were encouraged to ask questions that were answered to their stated satisfaction.  At the end of the conversation the patient is interested in moving forward with brachytherapy and use of SpaceOAR to reduce rectal toxicity from radiotherapy.  We will contact his pulmonologist, Dr. Vaughan Browner, regarding medical clearance to hold Eliquis prior to his procedure and request further direction regarding anticipated duration of therapy for the patient's clarification.  We will share our discussion with Dr. Karsten Ro and move forward with scheduling his CT [facility] planning appointment in the near future.  The patient will be contacted by Romie Jumper in our office who will be working closely with him to coordinate OR scheduling and pre and post procedure appointments.  We will contact the pharmaceutical rep to ensure that [employer] is available at the time of procedure.  He will have a prostate MRI following  his post-seed CT SIM to confirm appropriate distribution of the [place].Alex [name]   Given current concerns for patient exposure during the COVID-19 pandemic, this encounter was conducted via telephone. The patient was notified in advance and was offered a Wellsite geologist  to allow for face to face communication but unfortunately reported that he did not have the appropriate resources/technology to support such a visit and instead preferred to proceed with telephone consult. The patient has given verbal consent for this type of encounter. The time spent during this encounter was 60 minutes. The attendants for this meeting include Tyler Pita MD, Ashlyn Bruning [name], Katie [place], and patient Romin Divita.. During the encounter, Tyler Pita MD, Ashlyn Bruning [name], and scribe, Wilburn Mylar were located at [place].  Patient Truett Mcfarlan. was located at home.    Nicholos Johns, [name]    Tyler Pita, MD  [facility] Direct Dial: [phone]  Fax: [phone] [email or web address]  Skype  [email or web address]  This document serves as a record of services personally performed by Tyler Pita, MD and Freeman Caldron, [name]. It was created on their behalf by Wilburn Mylar, a trained medical scribe. The creation of this record is based on the scribe's personal observations and the provider's statements to them. This document has been checked and approved by the attending provider.

## 2019-08-09 ENCOUNTER — Encounter: Payer: Self-pay | Admitting: Medical Oncology

## 2019-08-09 NOTE — Progress Notes (Signed)
Spoke with patient to introduce myself as the prostate nurse navigator and discuss my role. I was unable to meet him 4/13, when he consulted with Dr. Tammi Klippel. He states the consult went well and he has chosen brachytherapy to treat his prostate cancer. He has not been scheduled and I informed him Enid Derry will contact him with appointments. He had questions about side effects post op and all questions were answered. I gave him my contact information and asked him to call me with questions or concerns. He voiced understanding of the above.

## 2019-08-12 NOTE — Progress Notes (Signed)
LMTCB x 1 

## 2019-08-13 NOTE — Progress Notes (Signed)
I called and spoke with the patient and made him aware. He verbalized understanding. I have made him a return visit for 08/28/19.

## 2019-08-28 ENCOUNTER — Ambulatory Visit: Payer: Medicare Other | Admitting: Pulmonary Disease

## 2019-08-28 ENCOUNTER — Other Ambulatory Visit: Payer: Self-pay

## 2019-08-28 ENCOUNTER — Encounter: Payer: Self-pay | Admitting: Pulmonary Disease

## 2019-08-28 DIAGNOSIS — R0602 Shortness of breath: Secondary | ICD-10-CM | POA: Diagnosis not present

## 2019-08-28 MED ORDER — FLUTICASONE PROPIONATE 50 MCG/ACT NA SUSP: 2.0000 | Freq: Every day | NASAL | 5 refills | Status: AC

## 2019-08-28 NOTE — Patient Instructions (Addendum)
Stop using the Symbicort as it is not helping Start flutter valve and Mucinex every day for clearance of secretion We will send in a prescription to the [place] for Flonase He can use Zyrtec over-the-counter for postnasal drip  We will order high-resolution CT, 6-minute walk test and PFTs in July 2021 Follow-up in clinic after that.

## 2019-08-28 NOTE — Progress Notes (Signed)
Alex [name].    EL:[id number]    1941/04/09  Primary Care Physician:Shaw, Gwyndolyn Saxon, MD  Referring Physician: [name], MD [address] [place],  [place] [address]  Chief complaint: Follow up for pulmonary fibrosis, post COVID-19 pneumonia  HPI: [name] is here for evaluation of dyspnea  He has past medical history of AL amyloidosis with nephrotic syndrome, hypertension, moderate nonobstructive CAD by cath 2011, aortic stenosis, GERD, hyperlipidemia, carotid artery disease.  Hospitalized from July 5 to July 25 at [facility] for COVID-19 pneumonia. He has received treatment with Remdesivir 7/6-7/10, steroids for 10 days up until 7/15, received Actemra on 7/11 and plasma on 7/10.  He also received hydroxychloroquine per his request after discussion of risks and benefits. Hospital course complicated with subsegmental pulmonary embolism and he is on anticoagulation,  pneumomediastinum that was treated conservatively.  Maintained on high flow nasal cannula.  Did not need to be intubated He has been discharged on supplemental oxygen.  Continues to have significant dyspnea on exertion at home.  He is working with physical therapy and trying to stay active at home.  History notable for amyloidosis status post autologous stem cell transplant in 2018.  Recently saw Dr. Minda Ditto, hematologist and maintenance [place] for amyloidosis is on hold.  Pets: Has a dog and cat.  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  No asbestos, mold, hot tub, Jacuzzi, down pillows or comforters Smoking history: 30-45-pack-year smoker.  Quit in 1971 Travel history: Previously lived in [place].  No significant recent travel Relevant family history: No significant family history of lung disease  Interim history: Continues to have dyspnea on exertion and first 2 hours in the morning he is congested. The congestion  makes it difficult for him to breath, but clears up throughout the day.  Continues on supplemental oxygen   Outpatient Encounter Medications as of 08/28/2019  Medication Sig  . Ascorbic Acid (VITAMIN C) 1000 MG tablet Take 1,000 mg by mouth 2 (two) times a day.   [name] Kitchen aspirin EC 81 MG tablet Take 81 mg by mouth daily.  [name] Kitchen atorvastatin (LIPITOR) 40 MG tablet Take 40 mg by mouth at bedtime.   [name] Kitchen b complex vitamins capsule Take 1 capsule by mouth daily.  . budesonide-formoterol (SYMBICORT) 160-4.5 MCG/ACT inhaler Inhale 2 puffs into the lungs 2 (two) times daily.  . Cholecalciferol (D3-1000) 25 MCG (1000 UT) tablet Take 1,000 Units by mouth 2 (two) times a day.   . ezetimibe (ZETIA) 10 MG tablet Take 10 mg by mouth daily.   . folic acid (FOLVITE) 1 MG tablet Take 1 mg by mouth at bedtime.   . furosemide (LASIX) 40 MG tablet Take 40 mg by mouth daily.  . isosorbide mononitrate (IMDUR) 15 mg TB24 24 hr tablet Take 15 mg by mouth daily.  . Multiple Vitamin (MULTI-VITAMINS) TABS Take 1 tablet by mouth daily.  . mupirocin ointment (BACTROBAN) 2 % Apply 1 application topically as needed (rash).   . nitroGLYCERIN (NITROSTAT) 0.4 MG SL tablet Place 0.4 mg under the tongue every 5 (five) minutes as needed for chest pain.  . pantoprazole (PROTONIX) 40 MG tablet Take 80 mg by mouth daily.   . potassium chloride SA (KLOR-CON) 20 MEQ tablet Take 20 mEq by mouth daily.  . vitamin B-12 (CYANOCOBALAMIN) 500 MCG tablet Take 500 mcg by mouth 2 (two) times daily.  . Zinc 50  MG TABS Take 1 tablet by mouth daily.  [name] Kitchen albuterol (VENTOLIN HFA) 108 (90 Base) MCG/ACT inhaler Inhale 2 puffs into the lungs every 4 (four) hours as needed for shortness of breath or wheezing.  . [DISCONTINUED] apixaban (ELIQUIS) 5 MG TABS tablet Take 1 tablet (5 mg total) by mouth 2 (two) times daily.   No facility-administered encounter medications on file as of 08/28/2019.    Allergies as of 01/10/2019 - Review Complete 01/10/2019  Allergen  Reaction Noted  . Ramipril Cough    Physical Exam: Blood pressure 122/80, pulse 70, temperature (!) 97 F (36.1 C), temperature source Temporal, weight 199 lb 9.6 oz (90.5 kg), SpO2 99 %. Gen:      No acute distress HEENT:  EOMI, sclera anicteric Neck:     No masses; no thyromegaly Lungs:    Bibasal crackles CV:         Regular rate and rhythm; harsh systolic murmur heard best at right upper sternal border Abd:      + bowel sounds; soft, non-tender; no palpable masses, no distension Ext:    No edema; adequate peripheral perfusion Skin:      Warm and dry; no rash Neuro: alert and oriented x 3 Psych: normal mood and affect  Data Reviewed: Imaging: CT chest 11/16/2016- peripheral GG opacities at the lung base.  Bilateral pulmonary nodules measuring 5 to 6 mm.   CT abdomen pelvis 12/28/2017- visualized lung bases appear clear. CTA 10/29/2018- no PE.  Multiple bilateral hazy pulmonary infiltrates CTA 11/01/2018- positive for PE.  Diffuse bilateral groundglass opacities. CT chest 11/08/2018 increasing groundglass opacities, consolidative changes.  Small pneumomediastinum.   High-resolution CT 02/04/2019- basilar predominant pulmonary fibrosis with traction bronchiectasis.  No honeycombing.  I have reviewed the images personally.  PFTs 04/03/2019 FVC 3.11 [75%], FEV1 2.69 [90%], F/F 87, TLC 4.29 [61%], DLCO 10.94 [44%] Moderate restriction with severe diffusion defect  6-minute walk test 04/05/2019-408 m, no desats  Cardiac: Echocardiogram 11/08/2018-LVEF 60-65% mild to moderate AS.  Right ventricle is not well visualized right atrial pressure is estimated at 10 mm.  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 Start flutter  valve and Mucinex everyday to help with clearance of secretion.   Nocturnal hypoxia Currently not on oxygen during daytime but continues on it at night He wants to know if he can come off oxygen altogether We will get an overnight oximetry on room air to evaluate this.  Post nasal drip Patient has sinus congestion , rhinorhea, and post nasal drip.  - will start Flonase and Zyrtec over the counter   Plan/Recommendations: - Stop Symbicort - Start flutter valve and mucinex - Start Zyrtec and Flonase  - Overnight oximetry on room air.  [name] MD [facility] 08/28/2019, 9:33 AM  CC: [name], MD

## 2019-09-02 ENCOUNTER — Inpatient Hospital Stay: Payer: Medicare Other | Attending: Hematology

## 2019-09-02 ENCOUNTER — Other Ambulatory Visit: Payer: Self-pay

## 2019-09-02 DIAGNOSIS — N049 Nephrotic syndrome with unspecified morphologic changes: Secondary | ICD-10-CM | POA: Diagnosis not present

## 2019-09-02 DIAGNOSIS — Z833 Family history of diabetes mellitus: Secondary | ICD-10-CM | POA: Diagnosis not present

## 2019-09-02 DIAGNOSIS — Z79899 Other long term (current) drug therapy: Secondary | ICD-10-CM | POA: Insufficient documentation

## 2019-09-02 DIAGNOSIS — I10 Essential (primary) hypertension: Secondary | ICD-10-CM | POA: Diagnosis not present

## 2019-09-02 DIAGNOSIS — E785 Hyperlipidemia, unspecified: Secondary | ICD-10-CM | POA: Diagnosis not present

## 2019-09-02 DIAGNOSIS — Z8546 Personal history of malignant neoplasm of prostate: Secondary | ICD-10-CM | POA: Diagnosis not present

## 2019-09-02 DIAGNOSIS — G4733 Obstructive sleep apnea (adult) (pediatric): Secondary | ICD-10-CM | POA: Diagnosis not present

## 2019-09-02 DIAGNOSIS — Z809 Family history of malignant neoplasm, unspecified: Secondary | ICD-10-CM | POA: Insufficient documentation

## 2019-09-02 DIAGNOSIS — N529 Male erectile dysfunction, unspecified: Secondary | ICD-10-CM | POA: Insufficient documentation

## 2019-09-02 DIAGNOSIS — E8581 Light chain (AL) amyloidosis: Secondary | ICD-10-CM | POA: Diagnosis present

## 2019-09-02 DIAGNOSIS — Z87891 Personal history of nicotine dependence: Secondary | ICD-10-CM | POA: Diagnosis not present

## 2019-09-02 DIAGNOSIS — Z7982 Long term (current) use of aspirin: Secondary | ICD-10-CM | POA: Insufficient documentation

## 2019-09-02 DIAGNOSIS — Z8249 Family history of ischemic heart disease and other diseases of the circulatory system: Secondary | ICD-10-CM | POA: Insufficient documentation

## 2019-09-02 LAB — CBC WITH DIFFERENTIAL/PLATELET
Abs Immature Granulocytes: 0.02 10*3/uL (ref 0.00–0.07)
Basophils Absolute: 0.1 10*3/uL (ref 0.0–0.1)
Basophils Relative: 1 %
Eosinophils Absolute: 0.2 10*3/uL (ref 0.0–0.5)
Eosinophils Relative: 3 %
HCT: 45.6 % (ref 39.0–52.0)
Hemoglobin: 14.8 g/dL (ref 13.0–17.0)
Immature Granulocytes: 0 %
Lymphocytes Relative: 25 %
Lymphs Abs: 1.7 10*3/uL (ref 0.7–4.0)
MCH: 29 pg (ref 26.0–34.0)
MCHC: 32.5 g/dL (ref 30.0–36.0)
MCV: 89.4 fL (ref 80.0–100.0)
Monocytes Absolute: 0.5 10*3/uL (ref 0.1–1.0)
Monocytes Relative: 8 %
Neutro Abs: 4.3 10*3/uL (ref 1.7–7.7)
Neutrophils Relative %: 63 %
Platelets: 161 10*3/uL (ref 150–400)
RBC: 5.1 MIL/uL (ref 4.22–5.81)
RDW: 14.8 % (ref 11.5–15.5)
WBC: 6.7 10*3/uL (ref 4.0–10.5)
nRBC: 0 % (ref 0.0–0.2)

## 2019-09-02 LAB — CMP (CANCER CENTER ONLY)
ALT: 23 U/L (ref 0–44)
AST: 24 U/L (ref 15–41)
Albumin: 3.8 g/dL (ref 3.5–5.0)
Alkaline Phosphatase: 76 U/L (ref 38–126)
Anion gap: 12 (ref 5–15)
BUN: 14 mg/dL (ref 8–23)
CO2: 27 mmol/L (ref 22–32)
Calcium: 8.9 mg/dL (ref 8.9–10.3)
Chloride: 106 mmol/L (ref 98–111)
Creatinine: 1.44 mg/dL — ABNORMAL HIGH (ref 0.61–1.24)
GFR, Est AFR Am: 54 mL/min — ABNORMAL LOW (ref >60)
GFR, Estimated: 47 mL/min — ABNORMAL LOW (ref >60)
Glucose, Bld: 102 mg/dL — ABNORMAL HIGH (ref 70–99)
Potassium: 3.5 mmol/L (ref 3.5–5.1)
Sodium: 145 mmol/L (ref 135–145)
Total Bilirubin: 1.3 mg/dL — ABNORMAL HIGH (ref 0.3–1.2)
Total Protein: 7 g/dL (ref 6.5–8.1)

## 2019-09-03 LAB — KAPPA/LAMBDA LIGHT CHAINS
Kappa free light chain: 21 mg/L — ABNORMAL HIGH (ref 3.3–19.4)
Kappa, lambda light chain ratio: 1.21 (ref 0.26–1.65)
Lambda free light chains: 17.3 mg/L (ref 5.7–26.3)

## 2019-09-04 LAB — MULTIPLE MYELOMA PANEL, SERUM
Albumin SerPl Elph-Mcnc: 3.5 g/dL (ref 2.9–4.4)
Albumin/Glob SerPl: 1.3 (ref 0.7–1.7)
Alpha 1: 0.2 g/dL (ref 0.0–0.4)
Alpha2 Glob SerPl Elph-Mcnc: 0.9 g/dL (ref 0.4–1.0)
B-Globulin SerPl Elph-Mcnc: 0.9 g/dL (ref 0.7–1.3)
Gamma Glob SerPl Elph-Mcnc: 0.8 g/dL (ref 0.4–1.8)
Globulin, Total: 2.9 g/dL (ref 2.2–3.9)
IgA: 97 mg/dL (ref 61–437)
IgG (Immunoglobin G), Serum: 830 mg/dL (ref 603–1613)
IgM (Immunoglobulin M), Srm: 53 mg/dL (ref 15–143)
Total Protein ELP: 6.4 g/dL (ref 6.0–8.5)

## 2019-09-09 ENCOUNTER — Inpatient Hospital Stay: Payer: Medicare Other | Admitting: Hematology

## 2019-09-09 ENCOUNTER — Other Ambulatory Visit: Payer: Self-pay

## 2019-09-09 VITALS — BP 121/85 | HR 77 | Temp 98.7°F | Resp 18 | Ht 70.0 in | Wt 204.4 lb

## 2019-09-09 DIAGNOSIS — E8581 Light chain (AL) amyloidosis: Secondary | ICD-10-CM | POA: Diagnosis not present

## 2019-09-09 DIAGNOSIS — C61 Malignant neoplasm of prostate: Secondary | ICD-10-CM

## 2019-09-09 DIAGNOSIS — Z7189 Other specified counseling: Secondary | ICD-10-CM

## 2019-09-09 NOTE — Progress Notes (Signed)
HEMATOLOGY/ONCOLOGY CLINIC NOTE  Date of Service: 09/09/19   Patient Care Team: [name], MD as PCP - General (Internal Medicine) Burnell Blanks, MD as PCP - Cardiology (Cardiology) Brunetta Genera, MD as Consulting Physician (Hematology) Cira Rue, RN Nurse Navigator as Registered Nurse (Medical Oncology) Nephrology - Dr Madelon Lips MD Dr Henrene Pastor MD - GI  CHIEF COMPLAINTS/PURPOSE OF CONSULTATION:  AL Amyloidosis -with Nephrotic syndrome . No overt CHF pEF  HISTORY OF PRESENTING ILLNESS:   Alex [name]. is a wonderful 79 y.o. male who has been referred to [place] by Dr .[name], MD /Dr Madelon Lips MD for evaluation and management of newly diagnosed Kidney AL Amyloidosis with nephrotic syndrome.  Patient has a history of hypertension, dyslipidemia, coronary artery disease, seizure disorder on Dilantin who was apparently in his usual state of health until 3 months ago when he started developing new onset lower extremity edema. Patient had a UA at the time that showed 4+ protein in 24-hour collection revealed 15 g of protein. Additional workup showed a creatinine of 0.8 with an albumin of 2.3 and negative SPEP and UPEP. Apparently had an elevated K/L SFLC ratio of 5.19.  He was urgently referred by his primary care physician to nephrology for additional evaluation. Patient notes no bleeding issues. No nosebleeds. No periorbital bleeding. No abnormal skin rashes. Has had significant NSAID exposure and use to take a fair amount of naproxen for chronic low back pain but has cut down on this.  Due to lack of clear etiology for his nephrotic syndrome the patient underwent a kidney biopsy on 08/06/2015 accession [id number] which showed AL amyloidosis, lambda immunophenotype, [place] red positive. Noted to have severe arteriosclerosis with moderate tubular interstitial scarring.  Patient was referred to [place] for further evaluation and treatment of his AL  Amyloidosis, Patient notes that his leg swelling has improved with diuretic therapy.  He notes no weight loss. No night sweats. No focal bone pains. No skin rashes. Lungs normal bleeding or bruising.  Patient notes that he had a lot of reading online and he and his wife had an extensive list of questions which were answered in detail.  He notes he has had some chronic issues with upper abdominal pain and nausea and that he follows with Dr. Henrene Pastor and is apparently being scheduled for an EGD and colonoscopy according to his report.  Denies having and lacks tongue. No chest pain and no overt new shortness of breath. Patient is uncertain if an echocardiogram has been ordered or scheduled.  INTERVAL HISTORY  Alex [name] is here for a scheduled follow-up for his AL amyloidosis. We are joined today by his wife, Mrs. Lindfors. The patient's last visit with [place] was on 07/08/2019. The pt reports that he is doing well overall.  The pt reports that he was taken off of Albuterol and Symbicort by Dr. Vaughan Browner. He is still having significant SOB with activity and is using oxygen therapy at night. Pt is currently walking around his neighborhood a few times per day. Dr. Vaughan Browner is currently arranging a LFT and CT scan.   Pt was diagnosed with prostate cancer after a positive core biopsy taken during a transrectal ultrasound on 07/09/19. He is planning starting brachytherapy with [facility], but is unsure when. Pt is currently following with Dr. Tammi Klippel for this.   He has noticed that his neuropathy appears to be "burning" more and becoming more frequent.   Pt has been fully vaccinated against COVID19  and has had his pneumonia vaccines in the last 5 years.   Lab results (09/02/19) of CBC w/diff and CMP is as follows: all values are WNL except for Glucose at 102, Creatinine at 1.44, Total Bilirubin at 1.3, GFR Est Non Af Am at 47. 09/02/2019 MMP is as follows all values are WNL. 09/02/2019 K/L light chains is as  follows: Kappa free light chain at 21.0, Lamda free light chains at 17.3, K/L light chain ratio at 1.21  On review of systems, pt reports SOB, neuropathy and denies leg swelling, abdominal pain and any other symptoms.   MEDICAL HISTORY:  Past Medical History:  Diagnosis Date  . Amyloidosis ([place])   . Arthritis   . Basal cell carcinoma   . CAD (coronary artery disease)    Lexiscan Myoview 6/14: Normal study, no scar or ischemia, EF 62%  . Cataract   . Chronic kidney disease   . Colon polyp   . Coronary artery disease    moderate disease by cath 2011  . Diverticulosis of colon   . GERD (gastroesophageal reflux disease)   . Hearing loss   . Heart disease   . Hiatal hernia   . Hyperlipidemia   . Hypertension   . MRSA (methicillin resistant staph aureus) culture positive   . Prostate cancer ([place])   . Seizure disorder ([place])   . Seizures ([place])    last in 1970s  Obstructive sleep apnea Gastroesophageal reflux disease Aortic stenosis Erectile dysfunction Peripheral neuropathy Seizure disorder   SURGICAL HISTORY: Past Surgical History:  Procedure Laterality Date  . BUNIONECTOMY    . CARPAL TUNNEL RELEASE  1994   left  . CERVICAL FUSION    . compression fracture    . FOOT SURGERY     right -twice, left foot once  . HERNIA REPAIR     Umbilical with PVP  . KNEE ARTHROSCOPY     left knee  . [place]   left - scope  . NASAL SEPTUM SURGERY    . NECK SURGERY    . NOSE SURGERY     twice  . PAROTIDECTOMY Right 03/09/2017  . ROTATOR CUFF REPAIR     twice, both shoulders  . ROTATOR CUFF REPAIR    . SHOULDER SURGERY    . SKIN BIOPSY    . SPINE SURGERY     C2, C3, C4  . TRIGGER FINGER RELEASE     both hands, twice  . TRIGGER FINGER RELEASE    . VASECTOMY      SOCIAL HISTORY: Social History   Socioeconomic History  . Marital status: Married    Spouse name: Not on file  . Number of children: 2  . Years of education: 39  . Highest education level: Not on  file  Occupational History  . Occupation: Retired  Tobacco Use  . Smoking status: Former Smoker    Packs/day: 3.00    Years: 20.00    Pack years: 60.00    Types: Cigarettes    Quit date: 04/25/1969    Years since quitting: 50.4  . Smokeless tobacco: Former Network engineer and Sexual Activity  . Alcohol use: Yes    Alcohol/week: 7.0 - 14.0 standard drinks    Types: 7 - 14 Glasses of wine per week    Comment: socially shots of wiskey  . Drug use: No  . Sexual activity: Not Currently    Comment: SHIM 1. Reports erectile dysfunction.  Other Topics Concern  .  Not on file  Social History Narrative   ** Merged History Encounter **   Lives at home w/ his wife   Right-handed   Caffeine: occasional Pepsi       Social Determinants of Health   Financial Resource Strain:   . Difficulty of Paying Living Expenses:   Food Insecurity:   . Worried About Charity fundraiser in the Last Year:   . Arboriculturist in the Last Year:   Transportation Needs:   . Film/video editor (Medical):   [name] Kitchen Lack of Transportation (Non-Medical):   Physical Activity:   . Days of Exercise per Week:   . Minutes of Exercise per Session:   Stress:   . Feeling of Stress :   Social Connections:   . Frequency of Communication with Friends and Family:   . Frequency of Social Gatherings with Friends and Family:   . Attends Religious Services:   . Active Member of Clubs or Organizations:   . Attends Archivist Meetings:   [name] Kitchen Marital Status:   Intimate Partner Violence:   . Fear of Current or Ex-Partner:   . Emotionally Abused:   [name] Kitchen Physically Abused:   . Sexually Abused:   Patient is currently retired.   FAMILY HISTORY: Family History  Problem Relation Age of Onset  . Diabetes Father   . Heart disease Father   . Heart attack Father 42  . Hypertension Father   . Hearing loss Mother   . Eczema Sister   . Cancer Sister        1 sister had cancer but patient unsure of type  .  Hyperlipidemia Other   . Diabetes Other   . Hypertension Other   . Seizures Other   . Thyroid disease Other   . Colon cancer Neg Hx   . Stroke Neg Hx   . Breast cancer Neg Hx   . Pancreatic cancer Neg Hx   . Prostate cancer Neg Hx     ALLERGIES:  is allergic to ramipril.  MEDICATIONS:  Current Outpatient Medications  Medication Sig Dispense Refill  . Ascorbic Acid (VITAMIN C) 1000 MG tablet Take 1,000 mg by mouth 2 (two) times a day.     [name] Kitchen aspirin EC 81 MG tablet Take 81 mg by mouth daily.    [name] Kitchen atorvastatin (LIPITOR) 40 MG tablet Take 40 mg by mouth at bedtime.     [name] Kitchen b complex vitamins capsule Take 1 capsule by mouth daily.    . Cholecalciferol (D3-1000) 25 MCG (1000 UT) tablet Take 1,000 Units by mouth 2 (two) times a day.     . ezetimibe (ZETIA) 10 MG tablet Take 10 mg by mouth daily.     . fluticasone (FLONASE) 50 MCG/ACT nasal spray Place 2 sprays into both nostrils daily. 16 g 5  . folic acid (FOLVITE) 1 MG tablet Take 1 mg by mouth at bedtime.     . furosemide (LASIX) 40 MG tablet Take 40 mg by mouth daily.    . isosorbide mononitrate (IMDUR) 15 mg TB24 24 hr tablet Take 15 mg by mouth daily.    . Multiple Vitamin (MULTI-VITAMINS) TABS Take 1 tablet by mouth daily.    . mupirocin ointment (BACTROBAN) 2 % Apply 1 application topically as needed (rash).     . pantoprazole (PROTONIX) 40 MG tablet Take 80 mg by mouth daily.     . potassium chloride SA (KLOR-CON) 20 MEQ tablet Take 20 mEq by mouth daily.    [name] Kitchen  vitamin B-12 (CYANOCOBALAMIN) 500 MCG tablet Take 500 mcg by mouth 2 (two) times daily.    . Zinc 50 MG TABS Take 1 tablet by mouth daily.    [name] Kitchen albuterol (VENTOLIN HFA) 108 (90 Base) MCG/ACT inhaler Inhale 2 puffs into the lungs every 4 (four) hours as needed for shortness of breath or wheezing.    . budesonide-formoterol (SYMBICORT) 160-4.5 MCG/ACT inhaler Inhale 2 puffs into the lungs 2 (two) times daily. (Patient not taking: Reported on 09/09/2019) 1 Inhaler 0  .  nitroGLYCERIN (NITROSTAT) 0.4 MG SL tablet Place 0.4 mg under the tongue every 5 (five) minutes as needed for chest pain.     No current facility-administered medications for this visit.    REVIEW OF SYSTEMS:   A 10+ POINT REVIEW OF SYSTEMS WAS OBTAINED including neurology, dermatology, psychiatry, cardiac, respiratory, lymph, extremities, GI, GU, Musculoskeletal, constitutional, breasts, reproductive, HEENT.  All pertinent positives are noted in the HPI.  All others are negative.   PHYSICAL EXAMINATION: ECOG PERFORMANCE STATUS: 1 - Symptomatic but completely ambulatory  Vitals:   09/09/19 1457  BP: 121/85  Pulse: 77  Resp: 18  Temp: 98.7 F (37.1 C)  SpO2: 96%   Filed Weights   09/09/19 1457  Weight: 204 lb 6.4 oz (92.7 kg)   .Body mass index is 29.33 kg/m.   Exam was given in a chair   GENERAL:alert, in no acute distress and comfortable SKIN: no acute rashes, no significant lesions EYES: conjunctiva are pink and non-injected, sclera anicteric OROPHARYNX: MMM, no exudates, no oropharyngeal erythema or ulceration NECK: supple, no JVD LYMPH:  no palpable lymphadenopathy in the cervical, axillary or inguinal regions LUNGS: clear to auscultation b/l with normal respiratory effort HEART: regular rate & rhythm ABDOMEN:  normoactive bowel sounds , non tender, not distended. No palpable hepatosplenomegaly.  Extremity: no pedal edema PSYCH: alert & oriented x 3 with fluent speech NEURO: no focal motor/sensory deficits  LABORATORY DATA:  I have reviewed the data as listed  . CBC Latest Ref Rng & Units 09/02/2019 07/08/2019 05/06/2019  WBC 4.0 - 10.5 K/uL 6.7 7.1 7.6  Hemoglobin 13.0 - 17.0 g/dL 14.8 14.0 13.1  Hematocrit 39.0 - 52.0 % 45.6 43.1 40.4  Platelets 150 - 400 K/uL 161 168 189    CMP Latest Ref Rng & Units 09/02/2019 07/08/2019 05/06/2019  Glucose 70 - 99 mg/dL 102(H) 104(H) 132(H)  BUN 8 - 23 mg/dL '14 18 16  ' Creatinine 0.61 - 1.24 mg/dL 1.44(H) 1.38(H) 1.48(H)    Sodium 135 - 145 mmol/L 145 143 142  Potassium 3.5 - 5.1 mmol/L 3.5 3.6 3.6  Chloride 98 - 111 mmol/L 106 106 108  CO2 22 - 32 mmol/L '27 26 24  ' Calcium 8.9 - 10.3 mg/dL 8.9 8.9 8.8(L)  Total Protein 6.5 - 8.1 g/dL 7.0 7.2 6.9  Total Bilirubin 0.3 - 1.2 mg/dL 1.3(H) 1.1 0.8  Alkaline Phos 38 - 126 U/L 76 80 92  AST 15 - 41 U/L '24 27 18  ' ALT 0 - 44 U/L '23 24 13        ' 07/09/2019 Prostate Patholgy Report ([id number]):    RADIOGRAPHIC STUDIES: I have personally reviewed the radiological images as listed and agreed with the findings in the report. No results found. Result status: Edited Result - FINAL                              *[place]*                  *  [facility] [address] [employer] [address]                        [place], [place] [address]                            [phone]  ------------------------------------------------------------------- Transthoracic Echocardiography  (Report amended )  Patient:    Mitesh, Rosendahl Alex #:       [id number] Study Date: 08/24/2016 Gender:     M Age:        46 Height:     180.3 cm Weight:     90.6 kg BSA:        2.15 m^2 Pt. Status: Room:   ATTENDING    Darlina Guys, MD  Willia Craze, Christopher  REFERRING    [place], [place], Outpatient  SONOGRAPHER  Roseanna Rainbow  cc:  ------------------------------------------------------------------- LV EF: 65% -   70%  ------------------------------------------------------------------- Indications:      Aortic stenosis 424.1.  ------------------------------------------------------------------- History:   PMH:  Amyloidosis.  Coronary artery disease.  Angina pectoris.  Risk factors:  Hypertension. Dyslipidemia.  ------------------------------------------------------------------- Study Conclusions  - Left ventricle: The cavity size was normal. Wall thickness was   increased in a pattern of mild  LVH. Systolic function was   vigorous. The estimated ejection fraction was in the range of 65%   to 70%. Wall motion was normal; there were no regional wall   motion abnormalities. Doppler parameters are consistent with   abnormal left ventricular relaxation (grade 1 diastolic   dysfunction). - Aortic valve: Valve mobility was mildly restricted. - Aortic root: The aortic root was mildly dilated. - Mitral valve: Calcified annulus.  Impressions:  - Vigorous LV systolic function; mild diastolic dysfunction; mild   LVH; calcified aortic valve with no significant AS (peak velocity   2 m/s; mean gradient 9 mmHg); mildly dilated aortic root.    ASSESSMENT & PLAN:   79 y.o.  male with above-mentioned multiple medical comorbidities with  #1 Biopsy proven Renal and Systemic AL Amyloidosis Initial BM Bx shows 9% plasma cells and amyloid deposits. Echo shows normal systolic function with only grade 1 diastolic dysfunction. Skeletal survey with no lytic lesions suggestive of multiple myeloma. Serum K/L ratio increased at about 6.22 on diagnosis and has now improved to 1.5 (wnl with normal kappa lambda serum free light chain ratio ) UPEP shows 24h protein improved from 15g to 5g daily 05/29/18 24hour UPEP no M spike   01/12/18 ECHO revealed LV EF of 60-65%   #2  Nephrotic Syndrome - likely related to AL amyloidosis.  Urine Study 07/26/17 showed Protein at 2.232g.  -07/19/17 the pt had a BM Bx exam which revealed Normocellular bone marrow (30-40%) with 2-3% plasma cells and focal amyloid deposition. UPEP 05/29/2018 - showed no M spike and total urinary protein of 179m/24h -- resolution of nephrotic syndrome.  #3 Prostatic Adenocarcinoma - Gleason Score 3+4=7  PLAN: -Discussed pt labwork, 09/02/19; blood counts are nml, blood chemistries look stable  -Discussed 09/02/2019 MMP shows no M Protein  -Discussed 09/02/2019 K/L light chains are stable  -The pt shows no lab or clinical evidence of AL  amyloidosis progression at this time -Advised pt that there are increased  risks of fatigue, cytopenias, and pneumonias when restarting Ninlaro, especially while on RT for prostate cancer.  -Would recommend holding Ninlaro at this time, as other health considerations take priority -Recommend pt continue to increase lung function with reasonable cardiovascular exercise -Recommend pt continue 1000 mcg Vitamin B12 daily. No unreasonable to take an additional B-complex vitamin.  -Advised pt that excessive Zinc can cause Copper deficiency, which can contribute to neuropathy. Would recommend he discontinue Zinc supplement.  -Recommend pt continue Vitamin D supplement -Will check Vitamin D, Vitamin B12 and Copper levels with next labs -Recommend pt f/u with Dr. Vaughan Browner for pulmonary function monitoring  -Recommend pt f/u with Dr. Tammi Klippel for RT for his prostate cancer. -Will see back in 3 months, with labs 1 week prior  #4 Patient Active Problem List   Diagnosis Date Noted  . Malignant neoplasm of prostate ([place]) 08/06/2019  . Pneumonia due to COVID-19 virus 10/29/2018  . Acute respiratory failure with hypoxia ([place]) 10/29/2018  . Thrombocytopenia ([place]) 10/29/2018  . Left lower lobe pneumonia 04/22/2018  . Sepsis due to pneumonia ([place]) 04/22/2018  . Engraftment syndrome following stem cell transplantation ([place]) 04/27/2017  . H/O autologous stem cell transplant ([place]) 04/10/2017  . Aortic stenosis 03/08/2017  . Hearing loss 03/08/2017  . OSA (obstructive sleep apnea) 03/08/2017  . Parotid mass 02/23/2017  . Dysfunction of right eustachian tube 02/06/2017  . Presbycusis of both ears 02/06/2017  . Folliculitis 16/38/4536  . AL amyloidosis ([place]) 08/21/2016  . Umbilical hernia 46/80/3212  . DIZZINESS 02/19/2010  . CAD, NATIVE VESSEL 07/21/2009  . CHEST PAIN-UNSPECIFIED 07/21/2009  . HYPERLIPIDEMIA 04/30/2007  . Essential hypertension 04/30/2007  . GASTROESOPHAGEAL REFLUX DISEASE 04/30/2007  .  SEIZURE DISORDER 04/30/2007  . COMPRESSION FRACTURE, L1 VERTEBRA 04/30/2007  . DIVERTICULOSIS, COLON, HX OF 04/30/2007  . Other postprocedural status(V45.89) 04/30/2007  -Continue follow-up with primary care physician for management of other medical co-morbidities. -Recommend if pt experiences spike fever to report to hospital with transplant team    FOLLOW UP: RTC with Dr [name] in 3 months Plz schedule labs 1 week prior to visit    The total time spent in the appt was 30 minutes and more than 50% was on counseling and direct patient cares.  All of the patient's questions were answered with apparent satisfaction. The patient knows to call the clinic with any problems, questions or concerns.   Sullivan Lone MD [place] AAHIVMS [facility] Hematology/Oncology Physician [facility]  (Office):       [phone] (Work cell):  [phone] (Fax):           [phone]  I, Yevette Edwards, am acting as a scribe for Dr. Sullivan Lone.   .I have reviewed the above documentation for accuracy and completeness, and I agree with the above. Brunetta Genera MD

## 2019-09-10 ENCOUNTER — Telehealth: Payer: Self-pay | Admitting: Hematology

## 2019-09-10 NOTE — Telephone Encounter (Signed)
Scheduled per 05/17 los, patient has been called and voicemail was left.

## 2019-09-16 ENCOUNTER — Ambulatory Visit: Payer: Medicare Other | Admitting: Cardiovascular Disease

## 2019-09-16 ENCOUNTER — Other Ambulatory Visit: Payer: Self-pay

## 2019-09-16 ENCOUNTER — Encounter: Payer: Self-pay | Admitting: Cardiovascular Disease

## 2019-09-16 VITALS — BP 128/74 | Ht 70.0 in | Wt 205.1 lb

## 2019-09-16 DIAGNOSIS — I350 Nonrheumatic aortic (valve) stenosis: Secondary | ICD-10-CM | POA: Diagnosis not present

## 2019-09-16 DIAGNOSIS — E7800 Pure hypercholesterolemia, unspecified: Secondary | ICD-10-CM | POA: Diagnosis not present

## 2019-09-16 DIAGNOSIS — I25118 Atherosclerotic heart disease of native coronary artery with other forms of angina pectoris: Secondary | ICD-10-CM | POA: Diagnosis not present

## 2019-09-16 DIAGNOSIS — I10 Essential (primary) hypertension: Secondary | ICD-10-CM | POA: Diagnosis not present

## 2019-09-16 NOTE — Progress Notes (Signed)
Chief Complaint  Patient presents with  . Follow-up    CAD   History of Present Illness: 79 yo male with history of amyloidosis with nephrotic syndrome, HTN, moderate non-obstructive CAD by cath 2011, aortic stenosis, GERD, hyperlipidemia and carotid artery disease who is here today for follow up. Carotid artery dopplers 04/05/12 with mild bilateral disease. Stress myoview July 2016 with no ischemia. Echo May 2018 with July 2016 with normal LV function, mild aortic stenosis. He was seen in our office March 2017 by Truitt Merle, NP and at that time noted occasional chest pain. Imdur was added. His chest pain improved. He was diagnosed with amyloidosis in April 2018 treated with chemotherapy for 8 months, then stem cell transplant December 2018 followed by chemotherapy.He is on maintenance chemotherapy. Echo September 2019 with LVEF=60-65%. Mild to moderate AS with mean gradient 19 mmHg, peak gradient 35 mmHg. He was admitted to [facility] July [id number] with complications from Covid 19. He developed respiratory failure and was treated with Remdesivir, steroids, Actemra and plasma. He was diagnosed with a pneumomediastinum. He was found to have a pulmonary embolism and was placed on Eliquis. Limited echo July 2020 with normal LV systolic function. No doppler assessment of the aortic valve. Echo March 2016 with normal LV systolic function and mild AS (mean gradient 11). He is now off of the Eliquis.   He is here today for follow up. The patient denies any palpitations, lower extremity edema, orthopnea, PND, dizziness, near syncope or syncope. He has occasional chest pain, never lasting more than a minute. Breathing is getting better post Covid infection.   Primary Care Physician: [name], MD  Past Medical History:  Diagnosis Date  . Amyloidosis ([place])   . Arthritis   . Basal cell carcinoma   . CAD (coronary artery disease)    Lexiscan Myoview 6/14: Normal study, no scar or ischemia, EF 62%   . Cataract   . Chronic kidney disease   . Colon polyp   . Coronary artery disease    moderate disease by cath 2011  . Diverticulosis of colon   . GERD (gastroesophageal reflux disease)   . Hearing loss   . Heart disease   . Hiatal hernia   . Hyperlipidemia   . Hypertension   . MRSA (methicillin resistant staph aureus) culture positive   . Prostate cancer ([place])   . Seizure disorder ([place])   . Seizures ([place])    last in 1970s    Past Surgical History:  Procedure Laterality Date  . BUNIONECTOMY    . CARPAL TUNNEL RELEASE  1994   left  . CERVICAL FUSION    . compression fracture    . FOOT SURGERY     right -twice, left foot once  . HERNIA REPAIR     Umbilical with PVP  . KNEE ARTHROSCOPY     left knee  . [place]   left - scope  . NASAL SEPTUM SURGERY    . NECK SURGERY    . NOSE SURGERY     twice  . PAROTIDECTOMY Right 03/09/2017  . ROTATOR CUFF REPAIR     twice, both shoulders  . ROTATOR CUFF REPAIR    . SHOULDER SURGERY    . SKIN BIOPSY    . SPINE SURGERY     C2, C3, C4  . TRIGGER FINGER RELEASE     both hands, twice  . TRIGGER FINGER RELEASE    . VASECTOMY  Current Outpatient Medications  Medication Sig Dispense Refill  . Ascorbic Acid (VITAMIN C) 1000 MG tablet Take 1,000 mg by mouth 2 (two) times a day.     [name] Kitchen aspirin EC 81 MG tablet Take 81 mg by mouth daily.    [name] Kitchen atorvastatin (LIPITOR) 40 MG tablet Take 40 mg by mouth at bedtime.     [name] Kitchen b complex vitamins capsule Take 1 capsule by mouth daily.    . budesonide-formoterol (SYMBICORT) 160-4.5 MCG/ACT inhaler Inhale 2 puffs into the lungs 2 (two) times daily. 1 Inhaler 0  . Cholecalciferol (D3-1000) 25 MCG (1000 UT) tablet Take 1,000 Units by mouth 2 (two) times a day.     . ezetimibe (ZETIA) 10 MG tablet Take 10 mg by mouth daily.     . fluticasone (FLONASE) 50 MCG/ACT nasal spray Place 2 sprays into both nostrils daily. 16 g 5  . folic acid (FOLVITE) 1 MG tablet Take 1 mg by mouth at bedtime.      . furosemide (LASIX) 40 MG tablet Take 40 mg by mouth daily.    . isosorbide mononitrate (IMDUR) 15 mg TB24 24 hr tablet Take 15 mg by mouth daily.    . Multiple Vitamin (MULTI-VITAMINS) TABS Take 1 tablet by mouth daily.    . mupirocin ointment (BACTROBAN) 2 % Apply 1 application topically as needed (rash).     . nitroGLYCERIN (NITROSTAT) 0.4 MG SL tablet Place 0.4 mg under the tongue every 5 (five) minutes as needed for chest pain.    . pantoprazole (PROTONIX) 40 MG tablet Take 80 mg by mouth daily.     . potassium chloride SA (KLOR-CON) 20 MEQ tablet Take 20 mEq by mouth daily.    . vitamin B-12 (CYANOCOBALAMIN) 500 MCG tablet Take 500 mcg by mouth 2 (two) times daily.     No current facility-administered medications for this visit.    Allergies  Allergen Reactions  . Ramipril Cough    Social History   Socioeconomic History  . Marital status: Married    Spouse name: Not on file  . Number of children: 2  . Years of education: 3  . Highest education level: Not on file  Occupational History  . Occupation: Retired  Tobacco Use  . Smoking status: Former Smoker    Packs/day: 3.00    Years: 20.00    Pack years: 60.00    Types: Cigarettes    Quit date: 04/25/1969    Years since quitting: 50.4  . Smokeless tobacco: Former Network engineer and Sexual Activity  . Alcohol use: Yes    Alcohol/week: 7.0 - 14.0 standard drinks    Types: 7 - 14 Glasses of wine per week    Comment: socially shots of wiskey  . Drug use: No  . Sexual activity: Not Currently    Comment: SHIM 1. Reports erectile dysfunction.  Other Topics Concern  . Not on file  Social History Narrative   ** Merged History Encounter **   Lives at home w/ his wife   Right-handed   Caffeine: occasional Pepsi       Social Determinants of Health   Financial Resource Strain:   . Difficulty of Paying Living Expenses:   Food Insecurity:   . Worried About Charity fundraiser in the Last Year:   . Arboriculturist  in the Last Year:   Transportation Needs:   . Film/video editor (Medical):   [name] Kitchen Lack of Transportation (Non-Medical):   Physical Activity:   .  Days of Exercise per Week:   . Minutes of Exercise per Session:   Stress:   . Feeling of Stress :   Social Connections:   . Frequency of Communication with Friends and Family:   . Frequency of Social Gatherings with Friends and Family:   . Attends Religious Services:   . Active Member of Clubs or Organizations:   . Attends Archivist Meetings:   [name] Kitchen Marital Status:   Intimate Partner Violence:   . Fear of Current or Ex-Partner:   . Emotionally Abused:   [name] Kitchen Physically Abused:   . Sexually Abused:     Family History  Problem Relation Age of Onset  . Diabetes Father   . Heart disease Father   . Heart attack Father 43  . Hypertension Father   . Hearing loss Mother   . Eczema Sister   . Cancer Sister        1 sister had cancer but patient unsure of type  . Hyperlipidemia Other   . Diabetes Other   . Hypertension Other   . Seizures Other   . Thyroid disease Other   . Colon cancer Neg Hx   . Stroke Neg Hx   . Breast cancer Neg Hx   . Pancreatic cancer Neg Hx   . Prostate cancer Neg Hx     Review of Systems:  As stated in the HPI and otherwise negative.   BP 128/74   Ht 5\' 10"  (1.778 m)   Wt 205 lb 1.9 oz (93 kg)   SpO2 96%   BMI 29.43 kg/m   Physical Examination:  General: Well developed, well nourished, NAD  HEENT: OP clear, mucus membranes moist  SKIN: warm, dry. No rashes. Neuro: No focal deficits  Musculoskeletal: Muscle strength 5/5 all ext  Psychiatric: Mood and affect normal  Neck: No JVD, no carotid bruits, no thyromegaly, no lymphadenopathy.  Lungs:Clear bilaterally, no wheezes, rhonci, crackles Cardiovascular: Regular rate and rhythm. Systolic murmur.  Abdomen:Soft. Bowel sounds present. Non-tender.  Extremities: No lower extremity edema. Pulses are 2 + in the bilateral DP/PT.  Echo March  2019: 1. Left ventricular ejection fraction, by estimation, is 60 to 65%. The  left ventricle has normal function. The left ventricle has no regional  wall motion abnormalities. There is mild left ventricular hypertrophy.  Left ventricular diastolic parameters  are consistent with Grade I diastolic dysfunction (impaired relaxation).  2. Right ventricular systolic function is normal. The right ventricular  size is normal.  3. The mitral valve is normal in structure. No evidence of mitral valve  regurgitation. No evidence of mitral stenosis.  4. The aortic valve is normal in structure. Aortic valve regurgitation is  not visualized. Mild aortic valve stenosis. Aortic valve area, by VTI  measures 1.50 cm. Aortic valve mean gradient measures 11.4 mmHg. Aortic  valve Vmax measures 2.26 m/s.  5. Aortic dilatation noted. There is mild dilatation of the ascending  aorta measuring 39 mm.  6. The inferior vena cava is normal in size with greater than 50%  respiratory variability, suggesting right atrial pressure of 3 mmHg.   EKG:  EKG is not ordered today. The ekg ordered today demonstrates   Recent Labs: 11/14/2018: Magnesium 2.2 09/02/2019: ALT 23; BUN 14; Creatinine 1.44; Hemoglobin 14.8; Platelets 161; Potassium 3.5; Sodium 145   Lipid Panel Followed in primary care   Wt Readings from Last 3 Encounters:  09/16/19 205 lb 1.9 oz (93 kg)  09/09/19 204 lb 6.4 oz (92.7  kg)  08/28/19 203 lb (92.1 kg)     Other studies Reviewed: Additional studies/ records that were reviewed today include: . Review of the above records demonstrates:    Assessment and Plan:   1. CAD with stable angina: He is known to have moderate CAD by cath in 2011. Rare chest pains. Continue ASA, Imdur and statin.    2. HYPERTENSION: BP is controlled. Continue current therapy.      3. HYPERLIPIDEMIA: Lipids followed in primary care. LDL 57 in March 2021. Continue statin.   4. Aortic stenosis: Mild to moderate  AS by echo in March 2021. Repeat echo in March 2022.     5. Amyloidosis: He is being followed in oncology. He has had chemotherapy and a stem cell transplant.   6. Pulmonary embolism: No longer on Eliquis. Follow up in primary care.   Current medicines are reviewed at length with the patient today.  The patient does not have concerns regarding medicines.  The following changes have been made:  no change  Labs/ tests ordered today include:   Orders Placed This Encounter  Procedures  . ECHOCARDIOGRAM COMPLETE    Disposition:   FU with me in 12 months.   Signed, [name], MD 09/16/2019 9:45 AM    [facility] [address], [place], [place]  [address] Phone: [phone]; Fax: [phone]

## 2019-09-16 NOTE — Patient Instructions (Signed)
Medication Instructions:  No changes *If you need a refill on your cardiac medications before your next appointment, please call your pharmacy*   Lab Work: none If you have labs (blood work) drawn today and your tests are completely normal, you will receive your results only by: . MyChart Message (if you have MyChart) OR . A paper copy in the mail If you have any lab test that is abnormal or we need to change your treatment, we will call you to review the results.   Testing/Procedures: Your physician has requested that you have an echocardiogram. Echocardiography is a painless test that uses sound waves to create images of your heart. It provides your doctor with information about the size and shape of your heart and how well your heart's chambers and valves are working. This procedure takes approximately one hour. There are no restrictions for this procedure.   Follow-Up: At [facility], you and your health needs are our priority.  As part of our continuing mission to provide you with exceptional heart care, we have created designated Provider Care Teams.  These Care Teams include your primary Cardiologist (physician) and Advanced Practice Providers (APPs -  Physician Assistants and Nurse Practitioners) who all work together to provide you with the care you need, when you need it.  We recommend signing up for the patient portal called "MyChart".  Sign up information is provided on this After Visit Summary.  MyChart is used to connect with patients for Virtual Visits (Telemedicine).  Patients are able to view lab/test results, encounter notes, upcoming appointments, etc.  Non-urgent messages can be sent to your provider as well.   To learn more about what you can do with MyChart, go to [email or web address].    Your next appointment:   12 month(s)  The format for your next appointment:   In Person  Provider:   You may see Christopher McAlhany, MD or one of the following Advanced  Practice Providers on your designated Care Team:    Dayna Dunn, [name]  Michele Lenze, [name]    Other Instructions   

## 2019-09-29 ENCOUNTER — Emergency Department (HOSPITAL_COMMUNITY)
Admission: EM | Admit: 2019-09-29 | Discharge: 2019-09-29 | Disposition: A | Discharge status: Home / Self Care | Payer: No Typology Code available for payment source | Attending: Emergency Medicine | Admitting: Emergency Medicine

## 2019-09-29 ENCOUNTER — Other Ambulatory Visit: Payer: Self-pay

## 2019-09-29 ENCOUNTER — Encounter (HOSPITAL_COMMUNITY): Payer: Self-pay

## 2019-09-29 ENCOUNTER — Emergency Department (HOSPITAL_COMMUNITY): Payer: No Typology Code available for payment source

## 2019-09-29 DIAGNOSIS — R0789 Other chest pain: Secondary | ICD-10-CM | POA: Diagnosis not present

## 2019-09-29 DIAGNOSIS — Z20822 Contact with and (suspected) exposure to covid-19: Secondary | ICD-10-CM | POA: Insufficient documentation

## 2019-09-29 DIAGNOSIS — Z87891 Personal history of nicotine dependence: Secondary | ICD-10-CM | POA: Insufficient documentation

## 2019-09-29 DIAGNOSIS — N3000 Acute cystitis without hematuria: Secondary | ICD-10-CM | POA: Diagnosis not present

## 2019-09-29 DIAGNOSIS — Z79899 Other long term (current) drug therapy: Secondary | ICD-10-CM | POA: Insufficient documentation

## 2019-09-29 DIAGNOSIS — C61 Malignant neoplasm of prostate: Secondary | ICD-10-CM | POA: Insufficient documentation

## 2019-09-29 DIAGNOSIS — I2510 Atherosclerotic heart disease of native coronary artery without angina pectoris: Secondary | ICD-10-CM | POA: Insufficient documentation

## 2019-09-29 DIAGNOSIS — R079 Chest pain, unspecified: Secondary | ICD-10-CM | POA: Diagnosis present

## 2019-09-29 DIAGNOSIS — Z7982 Long term (current) use of aspirin: Secondary | ICD-10-CM | POA: Diagnosis not present

## 2019-09-29 DIAGNOSIS — I10 Essential (primary) hypertension: Secondary | ICD-10-CM | POA: Insufficient documentation

## 2019-09-29 LAB — CBC WITH DIFFERENTIAL/PLATELET
Abs Immature Granulocytes: 0.07 10*3/uL (ref 0.00–0.07)
Basophils Absolute: 0 10*3/uL (ref 0.0–0.1)
Basophils Relative: 0 %
Eosinophils Absolute: 0 10*3/uL (ref 0.0–0.5)
Eosinophils Relative: 0 %
HCT: 44.4 % (ref 39.0–52.0)
Hemoglobin: 14.8 g/dL (ref 13.0–17.0)
Immature Granulocytes: 1 %
Lymphocytes Relative: 8 %
Lymphs Abs: 1.2 10*3/uL (ref 0.7–4.0)
MCH: 29.3 pg (ref 26.0–34.0)
MCHC: 33.3 g/dL (ref 30.0–36.0)
MCV: 87.9 fL (ref 80.0–100.0)
Monocytes Absolute: 0.9 10*3/uL (ref 0.1–1.0)
Monocytes Relative: 6 %
Neutro Abs: 12.4 10*3/uL — ABNORMAL HIGH (ref 1.7–7.7)
Neutrophils Relative %: 85 %
Platelets: 139 10*3/uL — ABNORMAL LOW (ref 150–400)
RBC: 5.05 MIL/uL (ref 4.22–5.81)
RDW: 14.6 % (ref 11.5–15.5)
WBC: 14.6 10*3/uL — ABNORMAL HIGH (ref 4.0–10.5)
nRBC: 0 % (ref 0.0–0.2)

## 2019-09-29 LAB — COMPREHENSIVE METABOLIC PANEL
ALT: 23 U/L (ref 0–44)
AST: 27 U/L (ref 15–41)
Albumin: 4.1 g/dL (ref 3.5–5.0)
Alkaline Phosphatase: 68 U/L (ref 38–126)
Anion gap: 12 (ref 5–15)
BUN: 19 mg/dL (ref 8–23)
CO2: 22 mmol/L (ref 22–32)
Calcium: 9 mg/dL (ref 8.9–10.3)
Chloride: 103 mmol/L (ref 98–111)
Creatinine, Ser: 1.48 mg/dL — ABNORMAL HIGH (ref 0.61–1.24)
GFR calc Af Amer: 53 mL/min — ABNORMAL LOW (ref >60)
GFR calc non Af Amer: 45 mL/min — ABNORMAL LOW (ref >60)
Glucose, Bld: 128 mg/dL — ABNORMAL HIGH (ref 70–99)
Potassium: 3.7 mmol/L (ref 3.5–5.1)
Sodium: 137 mmol/L (ref 135–145)
Total Bilirubin: 1.9 mg/dL — ABNORMAL HIGH (ref 0.3–1.2)
Total Protein: 7 g/dL (ref 6.5–8.1)

## 2019-09-29 LAB — URINALYSIS, ROUTINE W REFLEX MICROSCOPIC
Bilirubin Urine: NEGATIVE
Glucose, UA: NEGATIVE mg/dL
Hgb urine dipstick: NEGATIVE
Ketones, ur: NEGATIVE mg/dL
Nitrite: POSITIVE — AB
Protein, ur: NEGATIVE mg/dL
Specific Gravity, Urine: 1.009 (ref 1.005–1.030)
pH: 6 (ref 5.0–8.0)

## 2019-09-29 LAB — LACTIC ACID, PLASMA
Lactic Acid, Venous: 2.6 mmol/L (ref 0.5–1.9)
Lactic Acid, Venous: 1.6 mmol/L (ref 0.5–1.9)

## 2019-09-29 LAB — TROPONIN I (HIGH SENSITIVITY)
Troponin I (High Sensitivity): 6 ng/L (ref <18)
Troponin I (High Sensitivity): 6 ng/L (ref <18)

## 2019-09-29 LAB — SARS CORONAVIRUS 2 BY RT PCR (HOSPITAL ORDER, PERFORMED IN ~~LOC~~ HOSPITAL LAB): SARS Coronavirus 2: NEGATIVE

## 2019-09-29 MED ORDER — CEPHALEXIN 500 MG PO CAPS: 500.0000 mg | ORAL_CAPSULE | Freq: Four times a day (QID) | ORAL | 0 refills | Status: DC

## 2019-09-29 MED ORDER — SODIUM CHLORIDE 0.9 % IV BOLUS
500.0000 mL | Freq: Once | INTRAVENOUS | Status: AC
  Administered 2019-09-29: 500 mL via INTRAVENOUS

## 2019-09-29 MED ORDER — ACETAMINOPHEN 325 MG PO TABS
650.0000 mg | ORAL_TABLET | Freq: Once | ORAL | Status: AC
  Administered 2019-09-29: 650 mg via ORAL
  Filled 2019-09-29: qty 2

## 2019-09-29 MED ORDER — SODIUM CHLORIDE 0.9 % IV SOLN
1.0000 g | Freq: Once | INTRAVENOUS | Status: AC
  Administered 2019-09-29: 1 g via INTRAVENOUS
  Filled 2019-09-29: qty 10

## 2019-09-29 NOTE — Discharge Instructions (Signed)
Follow-up with your family doctor next week for recheck. 

## 2019-09-29 NOTE — ED Notes (Signed)
Date and time results received: 09/29/19 1605 (use smartphrase ".now" to insert current time)  Test: lactic Critical Value: 2.6  Name of Provider Notified: Zammit,MD2  Orders Received? Or Actions Taken?:

## 2019-09-29 NOTE — ED Provider Notes (Signed)
[facility] EMERGENCY DEPARTMENT Provider Note   CSN: [id number] Arrival date & time: 09/29/19  1431     History Chief Complaint  Patient presents with  . Chest Pain    Alex [name]. is a 79 y.o. male.  Patient complains of some chest discomfort and also fever and dysuria  The history is provided by the patient. No language interpreter was used.  Chest Pain Pain location:  L chest Pain quality: aching   Pain radiates to:  Does not radiate Pain severity:  Mild Onset quality:  Sudden Timing:  Intermittent Progression:  Improving Chronicity:  New Context: not breathing   Associated symptoms: no abdominal pain, no back pain, no cough, no fatigue and no headache        Past Medical History:  Diagnosis Date  . Amyloidosis ([place])   . Arthritis   . Basal cell carcinoma   . CAD (coronary artery disease)    Lexiscan Myoview 6/14: Normal study, no scar or ischemia, EF 62%  . Cataract   . Chronic kidney disease   . Colon polyp   . Coronary artery disease    moderate disease by cath 2011  . Diverticulosis of colon   . GERD (gastroesophageal reflux disease)   . Hearing loss   . Heart disease   . Hiatal hernia   . Hyperlipidemia   . Hypertension   . MRSA (methicillin resistant staph aureus) culture positive   . Prostate cancer ([place])   . Seizure disorder ([place])   . Seizures ([place])    last in 1970s    Patient Active Problem List   Diagnosis Date Noted  . Malignant neoplasm of prostate ([place]) 08/06/2019  . Pneumonia due to COVID-19 virus 10/29/2018  . Acute respiratory failure with hypoxia ([place]) 10/29/2018  . Thrombocytopenia ([place]) 10/29/2018  . Left lower lobe pneumonia 04/22/2018  . Sepsis due to pneumonia ([place]) 04/22/2018  . Engraftment syndrome following stem cell transplantation ([place]) 04/27/2017  . H/O autologous stem cell transplant ([place]) 04/10/2017  . Aortic stenosis 03/08/2017  . Hearing loss 03/08/2017  . OSA (obstructive sleep apnea) 03/08/2017  .  Parotid mass 02/23/2017  . Dysfunction of right eustachian tube 02/06/2017  . Presbycusis of both ears 02/06/2017  . Folliculitis 35/36/1443  . 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 Umbilical hernia 15/40/0867  . DIZZINESS 02/19/2010  . CAD, NATIVE VESSEL 07/21/2009  . CHEST PAIN-UNSPECIFIED 07/21/2009  . HYPERLIPIDEMIA 04/30/2007  . Essential hypertension 04/30/2007  . GASTROESOPHAGEAL REFLUX DISEASE 04/30/2007  . SEIZURE DISORDER 04/30/2007  . COMPRESSION FRACTURE, L1 VERTEBRA 04/30/2007  . DIVERTICULOSIS, COLON, HX OF 04/30/2007  . Other postprocedural status(V45.89) 04/30/2007    Past Surgical History:  Procedure Laterality Date  . BUNIONECTOMY    . CARPAL TUNNEL RELEASE  1994   left  . CERVICAL FUSION    . compression fracture    . FOOT SURGERY     right -twice, left foot once  . HERNIA REPAIR     Umbilical with PVP  . KNEE ARTHROSCOPY     left knee  . [place]   left - scope  . NASAL SEPTUM SURGERY    . NECK SURGERY    . NOSE SURGERY     twice  . PAROTIDECTOMY Right 03/09/2017  . ROTATOR CUFF REPAIR     twice, both shoulders  . ROTATOR CUFF REPAIR    . SHOULDER SURGERY    . SKIN BIOPSY    . SPINE SURGERY  C2, C3, C4  . TRIGGER FINGER RELEASE     both hands, twice  . TRIGGER FINGER RELEASE    . VASECTOMY         Family History  Problem Relation Age of Onset  . Diabetes Father   . Heart disease Father   . Heart attack Father 3  . Hypertension Father   . Hearing loss Mother   . Eczema Sister   . Cancer Sister        1 sister had cancer but patient unsure of type  . Hyperlipidemia Other   . Diabetes Other   . Hypertension Other   . Seizures Other   . Thyroid disease Other   . Colon cancer Neg Hx   . Stroke Neg Hx   . Breast cancer Neg Hx   . Pancreatic cancer Neg Hx   . Prostate cancer Neg Hx     Social History   Tobacco Use  . Smoking status: Former Smoker    Packs/day: 3.00    Years: 20.00    Pack years: 60.00    Types:  Cigarettes    Quit date: 04/25/1969    Years since quitting: 50.4  . Smokeless tobacco: Former Network engineer Use Topics  . Alcohol use: Yes    Comment: ovv  . Drug use: No    Home Medications Prior to Admission medications   Medication Sig Start Date End Date Taking? Authorizing Provider  Ascorbic Acid (VITAMIN C) 1000 MG tablet Take 1,000 mg by mouth 2 (two) times a day.     [provider]  aspirin EC 81 MG tablet Take 81 mg by mouth daily.    [provider]  atorvastatin (LIPITOR) 40 MG tablet Take 40 mg by mouth at bedtime.     [provider]  b complex vitamins capsule Take 1 capsule by mouth daily.    [provider]  budesonide-formoterol (SYMBICORT) 160-4.5 MCG/ACT inhaler Inhale 2 puffs into the lungs 2 (two) times daily. 01/10/19   Mannam, Hart Robinsons, MD  cephALEXin (KEFLEX) 500 MG capsule Take 1 capsule (500 mg total) by mouth 4 (four) times daily. 09/29/19   Milton Ferguson, MD  Cholecalciferol (D3-1000) 25 MCG (1000 UT) tablet Take 1,000 Units by mouth 2 (two) times a day.     [provider]  ezetimibe (ZETIA) 10 MG tablet Take 10 mg by mouth daily.  04/24/17   [provider]  fluticasone (FLONASE) 50 MCG/ACT nasal spray Place 2 sprays into both nostrils daily. 08/28/19   Mannam, Hart Robinsons, MD  folic acid (FOLVITE) 1 MG tablet Take 1 mg by mouth at bedtime.  04/24/17   [provider]  furosemide (LASIX) 40 MG tablet Take 40 mg by mouth daily.    [provider]  isosorbide mononitrate (IMDUR) 15 mg TB24 24 hr tablet Take 15 mg by mouth daily.    [provider]  Multiple Vitamin (MULTI-VITAMINS) TABS Take 1 tablet by mouth daily. 04/24/17   [provider]  mupirocin ointment (BACTROBAN) 2 % Apply 1 application topically as needed (rash).     [provider]  nitroGLYCERIN (NITROSTAT) 0.4 MG SL tablet Place 0.4 mg under the tongue every 5 (five) minutes as needed for chest pain.     [provider]  pantoprazole (PROTONIX) 40 MG tablet Take 80 mg by mouth daily.  08/28/18   [provider]  potassium chloride SA (KLOR-CON) 20 MEQ tablet Take 20 mEq by mouth daily.  [provider]  vitamin B-12 (CYANOCOBALAMIN) 500 MCG tablet Take 500 mcg by mouth 2 (two) times daily.    [provider]    Allergies    Ramipril  Review of Systems   Review of Systems  Constitutional: Negative for appetite change and fatigue.  HENT: Negative for congestion, ear discharge and sinus pressure.   Eyes: Negative for discharge.  Respiratory: Negative for cough.   Cardiovascular: Positive for chest pain.  Gastrointestinal: Negative for abdominal pain and diarrhea.  Genitourinary: Negative for frequency and hematuria.  Musculoskeletal: Negative for back pain.  Skin: Negative for rash.  Neurological: Negative for seizures and headaches.  Psychiatric/Behavioral: Negative for hallucinations.    Physical Exam Updated Vital Signs BP 137/80   Pulse 88   Temp (!) 100.8 F (38.2 C) (Oral)   Resp (!) 22   Ht 5\' 10"  (1.778 m)   Wt 88.5 kg   SpO2 99%   BMI 27.98 kg/m   Physical Exam Vitals reviewed.  Constitutional:      Appearance: He is well-developed.  HENT:     Head: Normocephalic.     Nose: Nose normal.  Eyes:     General: No scleral icterus.    Conjunctiva/sclera: Conjunctivae normal.  Neck:     Thyroid: No thyromegaly.  Cardiovascular:     Rate and Rhythm: Normal rate and regular rhythm.     Heart sounds: No murmur. No friction rub. No gallop.   Pulmonary:     Breath sounds: No stridor. No wheezing or rales.  Chest:     Chest wall: No tenderness.  Abdominal:     General: There is no distension.     Tenderness: There is no abdominal tenderness. There is no rebound.  Musculoskeletal:        General: Normal range of motion.     Cervical back: Neck supple.  Lymphadenopathy:     Cervical: No cervical adenopathy.  Skin:     Findings: No erythema or rash.  Neurological:     Mental Status: He is alert and oriented to person, place, and time.     Motor: No abnormal muscle tone.     Coordination: Coordination normal.  Psychiatric:        Behavior: Behavior normal.     ED Results / Procedures / Treatments   Labs (all labs ordered are listed, but only abnormal results are displayed) Labs Reviewed  CBC WITH DIFFERENTIAL/PLATELET - Abnormal; Notable for the following components:      Result Value   WBC 14.6 (*)    Platelets 139 (*)    Neutro Abs 12.4 (*)    All other components within normal limits  COMPREHENSIVE METABOLIC PANEL - Abnormal; Notable for the following components:   Glucose, Bld 128 (*)    Creatinine, Ser 1.48 (*)    Total Bilirubin 1.9 (*)    GFR calc non Af Amer 45 (*)    GFR calc Af Amer 53 (*)    All other components within normal limits  URINALYSIS, ROUTINE W REFLEX MICROSCOPIC - Abnormal; Notable for the following components:   APPearance HAZY (*)    Nitrite POSITIVE (*)    Leukocytes,Ua SMALL (*)    Bacteria, UA MANY (*)    All other components within normal limits  LACTIC ACID, PLASMA - Abnormal; Notable for the following components:   Lactic Acid, Venous 2.6 (*)    All other components within normal limits  CULTURE, BLOOD (ROUTINE X 2)  SARS CORONAVIRUS  2 BY RT PCR (HOSPITAL ORDER, [facility])  CULTURE, BLOOD (ROUTINE X 2)  URINE CULTURE  LACTIC ACID, PLASMA  TROPONIN I (HIGH SENSITIVITY)  TROPONIN I (HIGH SENSITIVITY)    EKG None  Radiology DG Chest Portable 1 View  Result Date: 09/29/2019 CLINICAL DATA:  Chest tightness since 9 a.m., prostate cancer EXAM: PORTABLE CHEST 1 VIEW COMPARISON:  01/18/2019 FINDINGS: Single frontal view of the chest demonstrates a stable cardiac silhouette. Bilateral areas of parenchymal lung scarring are noted, consistent with postinflammatory changes after previous COVID. No acute airspace disease, effusion, or  pneumothorax. No acute bony abnormalities. IMPRESSION: 1. Bilateral areas of postinflammatory scarring. No acute intrathoracic process. Electronically Signed   By: Randa Ngo M.D.   On: 09/29/2019 15:14    Procedures Procedures (including critical care time)  Medications Ordered in ED Medications  sodium chloride 0.9 % bolus 500 mL (0 mLs Intravenous Stopped 09/29/19 1628)  cefTRIAXone (ROCEPHIN) 1 g in sodium chloride 0.9 % 100 mL IVPB (0 g Intravenous Stopped 09/29/19 1713)    ED Course  I have reviewed the triage vital signs and the nursing notes.  Pertinent labs & imaging results that were available during my care of the patient were reviewed by me and considered in my medical decision making (see chart for details).    MDM Rules/Calculators/A&P                      Patient with atypical chest pain.  Troponin x2 is negative.  Patient with urinary tract infection he was discharged home with Keflex to follow-up with his PCP      This patient presents to the ED for concern of chest pain dysuria this involves an extensive number of treatment options, and is a complaint that carries with it a high risk of complications and morbidity.  The differential diagnosis includes pneumonia MI urinary tract infection   Lab Tests:  I Ordered, reviewed, and interpreted labs, which included troponin x2 which was negative and urinalysis shows urinary tract infection Medicines ordered:   I ordered medication Rocephin for UTI  Imaging Studies ordered:   I ordered imaging studies which included chest x-ray and  I independently visualized and interpreted imaging which showed no acute disease  Additional history obtained:   Additional history obtained from wife  Previous records obtained and reviewed   Consultations Obtained:  Reevaluation:  After the interventions stated above, I reevaluated the patient and found improved  Critical Interventions:  .   Final Clinical  Impression(s) / ED Diagnoses Final diagnoses:  Atypical chest pain  Acute cystitis without hematuria    Rx / DC Orders ED Discharge Orders         Ordered    cephALEXin (KEFLEX) 500 MG capsule  4 times daily     09/29/19 1814           Milton Ferguson, MD 09/29/19 1820

## 2019-09-29 NOTE — ED Triage Notes (Signed)
EMS reports pt has had chest tightness since 9am.  Reports had covid last year requiring ICU admission and reports has lasting effects.  Reports has prostate cancer, hasn't started treatment yet.  Described pain as a squeezing pain and sob.  Rates at 2/10.  EMS started IV, gave 324mg  asa and 1 nitro and pain decreased to 1.

## 2019-10-01 LAB — URINE CULTURE: Culture: >=100000 — AB

## 2019-10-02 ENCOUNTER — Telehealth: Payer: Self-pay | Admitting: *Deleted

## 2019-10-02 ENCOUNTER — Other Ambulatory Visit: Payer: Self-pay

## 2019-10-02 NOTE — Telephone Encounter (Signed)
CALLED PATIENT TO REMIND OF PRE-SEED APPTS. FOR 10-03-19, SPOKE WITH PATIENT AND HE IS AWARE OF THESE APPTS.

## 2019-10-02 NOTE — Telephone Encounter (Signed)
Post ED Visit - Positive Culture Follow-up  Culture report reviewed by antimicrobial stewardship pharmacist: [place] Team []  Elenor Quinones, Pharm.D. []  Heide Guile, Pharm.D., BCPS AQ-ID []  Parks Neptune, Pharm.D., BCPS []  Alycia Rossetti, Pharm.D., BCPS []  Haskins, Pharm.D., BCPS, AAHIVP []  Legrand Como, Pharm.D., BCPS, AAHIVP []  Salome Arnt, PharmD, BCPS []  Johnnette Gourd, PharmD, BCPS []  Hughes Better, PharmD, BCPS []  Leeroy Cha, PharmD []  Laqueta Linden, PharmD, BCPS []  Albertina Parr, PharmD  [place] Team []  Leodis Sias, PharmD []  Lindell Spar, PharmD []  Royetta Asal, PharmD []  Graylin Shiver, Rph []  Rema Fendt) Glennon Mac, PharmD []  Arlyn Dunning, PharmD []  Netta Cedars, PharmD []  Dia Sitter, PharmD []  Leone Haven, PharmD []  Gretta Arab, PharmD []  Theodis Shove, PharmD []  Peggyann Juba, PharmD []  Reuel Boom, PharmD   Positive urine culture, reviewed by Lorel Monaco, Pharm Resident Treated with Cephalexin, organism sensitive to the same and no further patient follow-up is required at this time.  Harlon Flor [facility] 10/02/2019, 9:48 AM

## 2019-10-03 ENCOUNTER — Encounter: Payer: Self-pay | Admitting: Medical Oncology

## 2019-10-03 ENCOUNTER — Ambulatory Visit
Admission: RE | Admit: 2019-10-03 | Discharge: 2019-10-03 | Disposition: A | Discharge status: Home / Self Care | Payer: No Typology Code available for payment source | Source: Ambulatory Visit | Attending: Urology | Admitting: Urology

## 2019-10-03 ENCOUNTER — Other Ambulatory Visit: Payer: Self-pay

## 2019-10-03 ENCOUNTER — Ambulatory Visit
Admission: RE | Admit: 2019-10-03 | Discharge: 2019-10-03 | Disposition: A | Discharge status: Home / Self Care | Payer: Medicare Other | Source: Ambulatory Visit | Attending: Radiation Oncology | Admitting: Radiation Oncology

## 2019-10-03 DIAGNOSIS — C61 Malignant neoplasm of prostate: Secondary | ICD-10-CM | POA: Diagnosis not present

## 2019-10-04 LAB — CULTURE, BLOOD (ROUTINE X 2)
Culture: NO GROWTH
Culture: NO GROWTH
Special Requests: ADEQUATE
Special Requests: ADEQUATE

## 2019-10-04 NOTE — Addendum Note (Signed)
Encounter addended by: Tyler Pita, MD on: 10/04/2019 4:14 PM  Actions taken: Medication List reviewed, Problem List reviewed, Allergies reviewed, Clinical Note Signed

## 2019-10-04 NOTE — Progress Notes (Addendum)
  Radiation Oncology         [phone]) [phone] ________________________________  Name: Alex [name]. MRN: [id number]  Date: 10/03/2019  DOB: 09-10-1940  SIMULATION AND TREATMENT PLANNING NOTE PUBIC ARCH STUDY  RA:QTMA, Gwyndolyn Saxon, MD  [name], MD  DIAGNOSIS:  79 y.o. gentleman with Stage T2a adenocarcinoma of the prostate with Gleason score of 3+4, and PSA of 11.9.  Oncology History  Malignant neoplasm of prostate ([place])  07/09/2019 Cancer Staging   Staging form: Prostate, AJCC 8th Edition - Clinical stage from 07/09/2019: Stage IIB (cT2a, cN0, cM0, PSA: 11.9, Grade Group: 2) - Signed by Freeman Caldron, [name] on 08/06/2019   08/06/2019 Initial Diagnosis   Malignant neoplasm of prostate ([place])       ICD-10-CM   1. Malignant neoplasm of prostate ([place])  C61     COMPLEX SIMULATION:  The patient presented today for evaluation for possible prostate seed implant. He was brought to the radiation planning suite and placed supine on the CT couch. A 3-dimensional image study set was obtained in upload to the planning computer. There, on each axial slice, I contoured the prostate gland. Then, using three-dimensional radiation planning tools I reconstructed the prostate in view of the structures from the transperineal needle pathway to assess for possible pubic arch interference. In doing so, I did not appreciate any pubic arch interference. Also, the patient's prostate volume was estimated based on the drawn structure. The volume was 24 cc.  Given the pubic arch appearance and prostate volume, patient remains a good candidate to proceed with prostate seed implant. Today, he freely provided informed written consent to proceed.    PLAN: The patient will undergo prostate seed implant.   ________________________________  Sheral Apley. Tammi Klippel, M.D.

## 2019-10-09 ENCOUNTER — Telehealth: Payer: Self-pay | Admitting: *Deleted

## 2019-10-09 ENCOUNTER — Other Ambulatory Visit: Payer: Self-pay | Admitting: Urology

## 2019-10-09 NOTE — Addendum Note (Signed)
Encounter addended by: Tyler Pita, MD on: 10/09/2019 11:15 AM  Actions taken: Medication List reviewed, Problem List reviewed, Allergies reviewed, Clinical Note Signed

## 2019-10-09 NOTE — Telephone Encounter (Signed)
Called patient to inform of implant date, spoke with patient's wife- Mardene Celeste and she is aware of this date.

## 2019-10-15 ENCOUNTER — Ambulatory Visit (HOSPITAL_COMMUNITY)
Admission: RE | Admit: 2019-10-15 | Discharge: 2019-10-15 | Disposition: A | Discharge status: Home / Self Care | Payer: Medicare Other | Source: Ambulatory Visit | Attending: Urology | Admitting: Urology

## 2019-10-15 ENCOUNTER — Other Ambulatory Visit: Payer: Self-pay

## 2019-10-15 ENCOUNTER — Encounter (HOSPITAL_COMMUNITY)
Admission: RE | Admit: 2019-10-15 | Discharge: 2019-10-15 | Disposition: A | Discharge status: Home / Self Care | Payer: Medicare Other | Source: Ambulatory Visit | Attending: Urology | Admitting: Urology

## 2019-10-15 DIAGNOSIS — C61 Malignant neoplasm of prostate: Secondary | ICD-10-CM | POA: Insufficient documentation

## 2019-10-24 ENCOUNTER — Other Ambulatory Visit: Payer: Self-pay

## 2019-10-24 ENCOUNTER — Ambulatory Visit (INDEPENDENT_AMBULATORY_CARE_PROVIDER_SITE_OTHER)
Admission: RE | Admit: 2019-10-24 | Discharge: 2019-10-24 | Disposition: A | Discharge status: Home / Self Care | Payer: No Typology Code available for payment source | Source: Ambulatory Visit | Attending: Pulmonary Disease | Admitting: Pulmonary Disease

## 2019-10-24 DIAGNOSIS — R0602 Shortness of breath: Secondary | ICD-10-CM | POA: Diagnosis not present

## 2019-10-25 ENCOUNTER — Other Ambulatory Visit: Payer: Self-pay | Admitting: Pharmacist

## 2019-10-29 ENCOUNTER — Other Ambulatory Visit (HOSPITAL_COMMUNITY)
Admission: RE | Admit: 2019-10-29 | Discharge: 2019-10-29 | Disposition: A | Discharge status: Home / Self Care | Payer: Medicare Other | Source: Ambulatory Visit | Attending: Pulmonary Disease | Admitting: Pulmonary Disease

## 2019-10-29 ENCOUNTER — Telehealth: Payer: Self-pay | Admitting: Pulmonary Disease

## 2019-10-29 DIAGNOSIS — Z20822 Contact with and (suspected) exposure to covid-19: Secondary | ICD-10-CM | POA: Insufficient documentation

## 2019-10-29 DIAGNOSIS — Z01812 Encounter for preprocedural laboratory examination: Secondary | ICD-10-CM | POA: Diagnosis present

## 2019-10-29 LAB — SARS CORONAVIRUS 2 (TAT 6-24 HRS): SARS Coronavirus 2: NEGATIVE

## 2019-10-29 NOTE — Telephone Encounter (Signed)
I checked through Dr. Matilde Bash papers in pod but did not find a surgical clearance form.  Attempted to call [facility] with [facility] but unable to reach. Left message for her to return call.

## 2019-10-30 NOTE — Telephone Encounter (Signed)
Also routing to [place] for her to help keep an eye out for form.

## 2019-10-30 NOTE — Telephone Encounter (Signed)
Called and spoke with Alex [name] asking her to refax the surgical clearance form as we have not seen it on pt. She stated that she would refax it to our office and requests a return call letting her know that it has been received.  Will keep encounter in triage as we await the fax.

## 2019-10-30 NOTE — Telephone Encounter (Signed)
Alex [name] returning call a/b surgical clearance  She can be reached @ [phone] ext [phone].Hillery Hunter

## 2019-10-31 NOTE — Telephone Encounter (Signed)
Surgical clearance fax received from [facility].  Will give to Dr Vaughan Browner 11/05/19, when he returns to office.  Patient's LOV 08/28/19, PFT scheduled 11/01/19.

## 2019-11-01 ENCOUNTER — Ambulatory Visit (INDEPENDENT_AMBULATORY_CARE_PROVIDER_SITE_OTHER): Payer: Medicare Other | Admitting: Pulmonary Disease

## 2019-11-01 ENCOUNTER — Other Ambulatory Visit: Payer: Self-pay

## 2019-11-01 DIAGNOSIS — R0602 Shortness of breath: Secondary | ICD-10-CM

## 2019-11-01 LAB — PULMONARY FUNCTION TEST
DL/VA % pred: 81 %
DL/VA: 3.23 ml/min/mmHg/L
DLCO cor % pred: 60 %
DLCO cor: 14.94 ml/min/mmHg
DLCO unc % pred: 61 %
DLCO unc: 15.03 ml/min/mmHg
FEF 25-75 Post: 4.12 L/sec
FEF 25-75 Pre: 3.56 L/sec
FEF2575-%Change-Post: 15 %
FEF2575-%Pred-Post: 193 %
FEF2575-%Pred-Pre: 167 %
FEV1-%Change-Post: 3 %
FEV1-%Pred-Post: 95 %
FEV1-%Pred-Pre: 92 %
FEV1-Post: 2.82 L
FEV1-Pre: 2.74 L
FEV1FVC-%Change-Post: 1 %
FEV1FVC-%Pred-Pre: 118 %
FEV6-%Change-Post: 1 %
FEV6-%Pred-Post: 83 %
FEV6-%Pred-Pre: 82 %
FEV6-Post: 3.23 L
FEV6-Pre: 3.19 L
FEV6FVC-%Change-Post: 0 %
FEV6FVC-%Pred-Post: 106 %
FEV6FVC-%Pred-Pre: 106 %
FVC-%Change-Post: 1 %
FVC-%Pred-Post: 78 %
FVC-%Pred-Pre: 77 %
FVC-Post: 3.23 L
FVC-Pre: 3.19 L
Post FEV1/FVC ratio: 87 %
Post FEV6/FVC ratio: 100 %
Pre FEV1/FVC ratio: 86 %
Pre FEV6/FVC Ratio: 100 %
RV % pred: 56 %
RV: 1.44 L
TLC % pred: 75 %
TLC: 5.22 L

## 2019-11-01 NOTE — Progress Notes (Signed)
Full PFT performed today. °

## 2019-11-05 ENCOUNTER — Telehealth: Payer: Self-pay | Admitting: Pulmonary Disease

## 2019-11-05 NOTE — Telephone Encounter (Signed)
Called to give Patient results from Dr Vaughan Browner for chest CT and PFT. Patient requested to know if lung capacity will improve or stay the same.  Message routed to Dr Vaughan Browner

## 2019-11-05 NOTE — Telephone Encounter (Signed)
Surgical clearance and PFT given to Dr Vaughan Browner.

## 2019-11-05 NOTE — Telephone Encounter (Signed)
Surgical clearance completed by Dr Vaughan Browner.  Faxed completed form to [facility], [phone]. Nothing further at this time.

## 2019-11-06 ENCOUNTER — Encounter (HOSPITAL_BASED_OUTPATIENT_CLINIC_OR_DEPARTMENT_OTHER): Payer: Self-pay | Admitting: Urology

## 2019-11-07 ENCOUNTER — Telehealth: Payer: Self-pay | Admitting: Radiation Oncology

## 2019-11-07 ENCOUNTER — Telehealth: Payer: Self-pay | Admitting: *Deleted

## 2019-11-07 ENCOUNTER — Encounter (HOSPITAL_BASED_OUTPATIENT_CLINIC_OR_DEPARTMENT_OTHER): Payer: Self-pay | Admitting: Urology

## 2019-11-07 ENCOUNTER — Other Ambulatory Visit: Payer: Self-pay

## 2019-11-07 ENCOUNTER — Encounter: Payer: Self-pay | Admitting: Medical Oncology

## 2019-11-07 NOTE — Progress Notes (Signed)
NEW Covid Policy July 2130  Surgery Day:  11-13-2019  Facility:  [facility]  Type of Surgery: Radioactive Prostate Seed Implants  Have you had Covid vaccine? YES  Fully Covid Vaccinated:   1) 05-20-2019                                          2) 06-17-2019                                          Where? [facility] [place] [facility]                                           Type? Moderna  In the past 14 days:        Have you had any symptoms? NO       Have you been tested covid positive? NO       Have you been in contact with someone covid positive? NO       Have you traveled internationally? NO        Is pt Immuno-compromised? YES (hx stem cell transplant)

## 2019-11-07 NOTE — Progress Notes (Signed)
Patient is scheduled 7/21 for brachytherapy and was instructed to go for COVID testing Saturday 7/17. He is not sure where and when he should go. I will do some research and return call to patient. He voiced understanding.

## 2019-11-07 NOTE — Telephone Encounter (Signed)
Received voicemail message from patient requesting return call. Phoned patient back promptly. Patient questions when and where he is supposed to be Saturday for his COVID injection. Explained that the epic record does not show an appointment then questioned if he ment to call another office. Patient unsure. Then, patient explained in the notes on his calendar he has written [phone]. Explained this is the number to [facility]. Patient verbalized understanding and appreciation for the return call.

## 2019-11-07 NOTE — Telephone Encounter (Signed)
Called patient to remind of lab and Covid testing for 11-11-19, spoke with patient and he is aware of these appts.

## 2019-11-07 NOTE — Progress Notes (Addendum)
ADDENDUM:  Chart reviewed by anesthesia, Alex Felix PA,  Refer to her progress note dated 11-07-2019 and updated today 11-12-2019.  ADDENDUM:  Pt has telephone cardiac  clearance dated 11-08-2019 in epic and her was given instructions to stop ASA 81mg  on 11-08-2019 as his last dose by office.   ADDENDUM:  Chart reviewed by anesthesia, Alex Felix PA,  Stated pt would need cardiac clearance prior to surgery.  Called and left message with Alex [name], OR scheduler for Alex [name], needing cardiac clearance from pt's cardiologist Alex [name] prior to surgery.  Spoke w/ via phone for pre-op interview--- Pt and Pt's wife, Alex [name] needs dos----  no             [name] results------ getting CBC, CMP, PT/PTT 11-11-2019 @ 1030;  Current ekg/ chest ct in epic/ chart COVID test ------ 11-11-2019 @ 1110 Pt aware to bring vaccine card dos for verification.  Arrive at ------- 0932 NPO after MN NO Solid Food.  Clear liquids from MN until--- 0815 Medications to take morning of surgery ----- Imdur, Protonix, Flomax w/ sips of water Diabetic medication ----- n/a Patient Special Instructions ----- n/ a  Pre-Op special Istructions ----- pulmonary clearance dated 11-05-2019 received via fax from Alex pace office placed in chart (this was for eliquis but had completed treatment few months ago now on ASA 81mg ) Patient verbalized understanding of instructions that were given at this phone interview. Patient denies shortness of breath, chest pain, fever, cough a this phone interview.   Anesthesia Review:  Hx HTN, CAD (no hx intervention);  Amyloidosis, renal / systemic s/p chemo / stem cell transplant;  Hx covid 19 06/ 2020 hospitalized ( not intubated) residual pulmonary fibrosis with sob/ doe,  Pt uses supplemental oxygen, continuous at night (2L via [place]) and as needed during the day.  Pt stated he dose have a pulse ox at home, on RA sitting down 96-97% and he walks around house 93-94%. Stated he walks around  neighborhood for 30 minutes twice daily he has to stop several times due to sob/ chest tightness and since the weather has been hot he does use his O2 when walking and with long walking distance going to store/ doctor appts.  Denies cardiac symptoms, last taken a nitro early 2020.  Updated hx in epic.  Chart to be reviewed by Alex Felix PA.  [facility]  [name] [place]  Cardiologist : Alex [name] [facility] 09-16-2019 epic) and Alex [name] w/ [facility] in [place]  Chest x-ray : Chest CT 10-24-2019 epic PFTs:  10-29-2019 epic EKG : 09-29-2019 epic Echo : 07-09-2019 epic Stress test:  11-14-2014 epic (Also, pt stated is scheduled for nuclear stress study for 11-26-2019 @ [facility] in [place]. When asked, pt stated  this was scheduled because it was time for him to get one and not because of any cardiac problems.  Cardiac Cath :  Last one 07-07-2009 epic Activity level:  See above Sleep Study/ CPAP : YES/ NO Fasting Blood Sugar :      / Checks Blood Sugar -- times a day:  N/A Blood Thinner/ Instructions /Last Dose: NO ASA / Instructions/ Last Dose : ASA 81mg /  Pt stated was given any instructions from Alex pace if need to stop or not.  Advised to call office for instructions.

## 2019-11-07 NOTE — Progress Notes (Signed)
I called pre-admission testing and spoke with [facility] about COVID testing site.Testing is being done on Saturday at the old [facility] and between 9-12. I called patient with this information and stressed that he needs to be there prior to 12. He voiced understanding and was very appreciative of my time and efforts.

## 2019-11-08 ENCOUNTER — Telehealth: Payer: Self-pay | Admitting: Cardiovascular Disease

## 2019-11-08 ENCOUNTER — Other Ambulatory Visit: Payer: Self-pay | Admitting: *Deleted

## 2019-11-08 MED ORDER — NITROGLYCERIN 0.4 MG SL SUBL: 0.4000 mg | SUBLINGUAL_TABLET | SUBLINGUAL | 3 refills | Status: DC | PRN

## 2019-11-08 NOTE — Progress Notes (Addendum)
Anesthesia Chart Review   Case: [id number] Date/Time: 11/13/19 1200   Procedures:      RADIOACTIVE SEED IMPLANT/BRACHYTHERAPY IMPLANT (N/A ) - ONLY NEEDS 90 MIN FOR ALL PROCEDURES     SPACE OAR INSTILLATION (N/A )   Anesthesia type: General   Pre-op diagnosis: PROSTATE CANCER   Location: [facility] OR ROOM 1 / [place]   Surgeons: Alex Fries, MD      DISCUSSION:79 y.o. 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 Jacalyn [name].   Pt with post COVID pulmonary fibrosis.  Admitted 10/2018 due to [place].  Discharged on supplemental O2.  Continues to have DOE with use of O2 at bedtime and prn with activity.   Pt last seen by cardiologist 09/16/2019.  Stable at this visit with 1 year follow up recommended.       Pt seen in ED 09/29/2019 due to chest pain.  Troponin negative x 2.  Dx with UTI and discharged on Keflex.    Discussed with Dr. Jillyn [name] on 11/05/2019.  Per Dr. Jillyn [name] can proceed with seed implant at surgery center.    Addendum 11/08/2019:  Surgery center nurse reports that during her interview pt said he is scheduled for a stress test at the [facility] 11/26/2019.  Due to pending stress test, recent ED visit for chest pain, need cardiology input prior to proceeding.  Discussed with Dr. Valma [name] who agrees with above plan.   Addendum 11/12/2019:  Per cardiology preoperative risk assessment, "Chart reviewed as part of pre-operative protocol coverage. Patient was contacted 11/08/2019 in reference to pre-operative risk assessment for pending surgery as outlined below.  Alex [name]. was last seen on 09/16/19 by Dr. Angelena [name]. He has history of syndrome, HTN, moderate non-obstructive CAD by cath  2011, mild aortic stenosis, GERD, hyperlipidemia, mild carotid artery disease, amyloidosis treated with chemotherapy, Covid-19 in July 2020 with respiratory failure and PE (treated with course of Eliquis). Cath 2011 with nonobstructive disease. Nuc stress in 2016 was normal.  At recent OV 08/2019 he reported exertional dyspnea ever since Covid as well as occasional chest pain, never lasting more than a minute. I spoke with patient today to discuss symptoms. He reports the exertional dyspnea is still there, and he also notices he sometimes get chest discomfort towards the end of when his breathing gets short. He is able to rest and the symptoms resolve. He reports this symptom is stable ever since he contracted Covid last year, and was present during recent eval 08/2019 without any recent exacerbations. He has not had any rest chest pain. CT 10/2019 showed continued interstitial lung disease. I spoke with Dr. Angelena [name] about him. RCRI 0.9% indicating low risk of CV complications. Dr. Angelena [name] has felt his symptoms are likely post-Covid related and feels he is OK to proceed with surgery without further cardiovascular testing. He feels the patient is OK to hold aspirin as requested. I will route to callback team to call patient to let him know. It would also be helpful to have him have follow-up in our office again in a few weeks to make sure symptoms are stable/improving."  VS: Ht 5\' 10"  (1.778 m)   Wt 90.7 kg   BMI 28.70 kg/m  PROVIDERS: Alex Redwood, MD is PCP   Alex Chandler, MD is Cardiologist  LABS: labs DOS (all labs ordered are listed, but only abnormal results are displayed)  Labs Reviewed - No data to display   IMAGES:   EKG: 09/30/2019 Rate 92 bpm  Sinus rhythm  LAD, consider left anterior fascicular block  Consider anterior infarcte  CV: Echo 07/09/2019 IMPRESSIONS    1. Left ventricular ejection fraction, by estimation, is 60 to 65%. The  left ventricle has normal  function. The left ventricle has no regional  wall motion abnormalities. There is mild left ventricular hypertrophy.  Left ventricular diastolic parameters  are consistent with Grade I diastolic dysfunction (impaired relaxation).  2. Right ventricular systolic function is normal. The right ventricular  size is normal.  3. The mitral valve is normal in structure. No evidence of mitral valve  regurgitation. No evidence of mitral stenosis.  4. The aortic valve is normal in structure. Aortic valve regurgitation is  not visualized. Mild aortic valve stenosis. Aortic valve area, by VTI  measures 1.50 cm. Aortic valve mean gradient measures 11.4 mmHg. Aortic  valve Vmax measures 2.26 m/s.  5. Aortic dilatation noted. There is mild dilatation of the ascending  aorta measuring 39 mm.  6. The inferior vena [name] is normal in size with greater than 50%  respiratory variability, suggesting right atrial pressure of 3 mmHg  Myocardial Perfusion 11/14/2014  Nuclear stress EF: 67%.  The left ventricular ejection fraction is hyperdynamic (>65%).  There was no ST segment deviation noted during stress.  The study is normal.  This is a low risk study.   Normal study with no evidence of prior infarct or ischemia  Past Medical History:  Diagnosis Date  . AL amyloidosis [facility]) oncologist-- dr Alex [name]   Renal and Systemic , dx 04/ 2018;  renal bx 08-05-2016 and bone marrow bx 08-29-2016;  8 month chemotherapy then stell cell transplant 12/ 2018 then chemo again;  since then been on oral chemo daily (11-07-2019 per pt has been on hold until after prostate seed implants)  . Arthritis   . CAD (coronary artery disease) cardiologist-- dr Alex [name];  also followed by cardiologist w/ [facility] in [place], dr Alex [name]   moderate nonobstrucive disease by cath 2011; Lexiscan Myoview 6/14--Normal study, no scar or ischemia, EF 62%;  11-14-2014 nuclear study low risk w/ normal perfusion, nuclear ef 67%  . CKD  (chronic kidney disease), stage III   . Diverticulosis of colon   . GERD (gastroesophageal reflux disease)   . H/O stem cell transplant ([place]) 03/2017  . Hiatal hernia   . History of 2019 novel coronavirus disease (COVID-19) 09/2018   [facility], hospital admission 10-28-2019 respiratory failure/ pneumometrostinom/ PE ;  was not intubated , treatment with remdesivir/ steroids/ actemra/ plasma  . History of basal cell carcinoma (BCC)   . History of colon polyps   . History of MRSA infection    culture positive  . History of pulmonary embolus (PE) 10/2018   with covid 19, completed treatment with eliquis  . History of seizures    11-07-2019  per pt last one 1970s, unknown cause  . History of vertebral compression fracture 1988   lumbar  . Hyperlipidemia   . Hypertension   . Neuropathy due to chemotherapeutic drug (HCC)    fingers tips and feet  . Nocturia   . On supplemental oxygen therapy    post covid 19;   11-07-2019 pt  uses O2 every night 2L via [place],  and uses supplements during the day when walking around neighborhood and goes out to appointments/ store that he has to walk a distance  . OSA (obstructive sleep apnea)    study in epic 03-18-2005 mild to moderate   (11-07-2019  per pt used cpap up until when he lost alot of weight, stoped approx. 2012  . Prostate cancer [facility]) urologist--- dr pace/  oncology-- dr Tammi Klippel   dx 03/ 2021  . Pulmonary fibrosis ([place]) 09/2018   pulmonologist-- dr Vaughan Browner;   post covid 19  . SOB (shortness of breath) on exertion    11-07-2019  per pt walks around neighborhood for 30 minutes twice daily, he does have to stop frequently d/t sob and since weather has been hot he uses his supplemental oxygen;  stated he has a pulse ox. at home , when he is sitting down O2 sat 96 -97% on RA when up walking around the house O2 sat 93-94% on RA;  pt stated he carry's his O2 w/ him when he goes to doctor appts/store d/t distance  . Wears glasses    . Wears hearing aid in both ears     Past Surgical History:  Procedure Laterality Date  . CARDIAC CATHETERIZATION  05-21-1999  and 12-29-2000  @[facility]   dr Maryjo [place] nuclear study;  moderate nonobstructive disease involving LAD and LCx;  second done due to in Reversal research trial  . CARDIAC CATHETERIZATION  07/07/2009  dr Alex [name]   nonobstructive disease w/ normal LVF  . CARPAL TUNNEL RELEASE Left 1994  . CERVICAL FUSION  1988   C2 -- 4  . FOOT SURGERY Bilateral x3 last one 1980s   bunionectomy great toe--- x2 right and x1 left  . KNEE ARTHROSCOPY Left 1996  . NASAL SEPTUM SURGERY  x3 last one 08-09-2010 @ [place]  . PAROTIDECTOMY Right 03/09/2017  . ROTATOR CUFF REPAIR Bilateral x1 rigth (unsure date);  x2 left last one 09-07-2009 @[facility] ;  right  . TRIGGER FINGER RELEASE Bilateral x2  last one 2001 approx.   x1 finger left hand;  x2 fingers right hand  . UMBILICAL HERNIA REPAIR  01/19/2011  . VASECTOMY  approx. 1968 (with anesthesia)    MEDICATIONS: No current facility-administered medications for this encounter.   [name] Kitchen acyclovir (ZOVIRAX) 200 MG capsule  . Ascorbic Acid (VITAMIN C) 1000 MG tablet  . aspirin EC 81 MG tablet  . atorvastatin (LIPITOR) 40 MG tablet  . b complex vitamins capsule  . Cholecalciferol (D3-1000) 25 MCG (1000 UT) tablet  . fluticasone (FLONASE) 50 MCG/ACT nasal spray  . folic acid (FOLVITE) 1 MG tablet  . furosemide (LASIX) 40 MG tablet  . isosorbide mononitrate (IMDUR) 15 mg TB24 24 hr tablet  . Multiple Vitamin (MULTI-VITAMINS) TABS  . nitroGLYCERIN (NITROSTAT) 0.4 MG SL tablet  . pantoprazole (PROTONIX) 40 MG tablet  . potassium chloride SA (KLOR-CON) 20 MEQ tablet  . tamsulosin (FLOMAX) 0.4 MG CAPS capsule  . triamcinolone cream (KENALOG) 0.1 %  . vitamin B-12 (CYANOCOBALAMIN) 500 MCG tablet  . ezetimibe (ZETIA) 10 MG tablet   [facility] Pre-Surgical Testing [phone] 11/08/19  4:14 PM

## 2019-11-08 NOTE — Telephone Encounter (Signed)
New message      [facility] Pre-operative Risk Assessment    HEARTCARE STAFF: - Please ensure there is not already an duplicate clearance open for this procedure. - Under Visit Info/Reason for Call, type in Other and utilize the format Clearance MM/DD/YY or Clearance TBD. Do not use dashes or single digits. - If request is for dental extraction, please clarify the # of teeth to be extracted.  Request for surgical clearance:  1. What type of surgery is being performed? Prostate seed implant with space oar  2. When is this surgery scheduled?11/13/19  3. What type of clearance is required (medical clearance vs. Pharmacy clearance to hold med vs. Both)? Both  4. Are there any medications that need to be held prior to surgery and how long?stop aspirin 5 days prior   5. Practice name and name of physician performing surgery? [facility], Dr. Leonia Reader Pace  6. What is the office phone number? [phone]   7.   What is the office fax number? [phone]  8.   Anesthesia type (None, local, MAC, general) ? general   Alex [name] 11/08/2019, 3:54 PM  _________________________________________________________________   (provider comments below)

## 2019-11-08 NOTE — Telephone Encounter (Signed)
   Primary Cardiologist: [name], MD  Chart reviewed as part of pre-operative protocol coverage. Patient was contacted 11/08/2019 in reference to pre-operative risk assessment for pending surgery as outlined below.  Alex [name]. was last seen on 09/16/19 by Dr. [name]. He has history of syndrome, HTN, moderate non-obstructive CAD by cath 2011, mild aortic stenosis, GERD, hyperlipidemia, mild carotid artery disease, amyloidosis treated with chemotherapy, Covid-19 in July 2020 with respiratory failure and PE (treated with course of Eliquis). Cath 2011 with nonobstructive disease. Nuc stress in 2016 was normal.   At recent OV 08/2019 he reported exertional dyspnea ever since Covid as well as occasional chest pain, never lasting more than a minute. I spoke with patient today to discuss symptoms. He reports the exertional dyspnea is still there, and he also notices he sometimes get chest discomfort towards the end of when his breathing gets short. He is able to rest and the symptoms resolve. He reports this symptom is stable ever since he contracted Covid last year, and was present during recent eval 08/2019 without any recent exacerbations. He has not had any rest chest pain. CT 10/2019 showed continued interstitial lung disease.  I spoke with Dr. [name] about him. RCRI 0.9% indicating low risk of CV complications. Dr. [name] has felt his symptoms are likely post-Covid related and feels he is OK to proceed with surgery without further cardiovascular testing. He feels the patient is OK to hold aspirin as requested. I will route to callback team to call patient to let him know. It would also be helpful to have him have follow-up in our office again in a few weeks to make sure symptoms are stable/improving.  I will route this recommendation to the requesting party via Epic fax function and remove from pre-op pool. Please call with questions.  Alex Pitter, [name] 11/08/2019, 4:10 PM

## 2019-11-08 NOTE — Telephone Encounter (Signed)
Pt aware to hold ASA x 5 days prior to his procedure. Advised the pt he will need to hold ASA as of today. Pt is agreeable to plan of care and thanked me for the call. I will fax notes to requesting office. Discussed appt recommendations from pre op provider today to follow up in a few weeks, see notes from Melina Copa, Door. Pt states he really feels like his symptoms he had back in May were due to when he had Covid. Pt states he had pneumonia x 2 as well as other problems from Covid. Pt states he recently had a breathing test done and was told his breathing is getting better. I advised pt let's go ahead and make appt to just make sure he is doing ok. Pt is agreeable to plan of care and thanked me for the call.

## 2019-11-11 ENCOUNTER — Other Ambulatory Visit (HOSPITAL_COMMUNITY)
Admission: RE | Admit: 2019-11-11 | Discharge: 2019-11-11 | Disposition: A | Discharge status: Home / Self Care | Payer: Medicare Other | Source: Ambulatory Visit | Attending: Urology | Admitting: Urology

## 2019-11-11 ENCOUNTER — Encounter (HOSPITAL_COMMUNITY)
Admission: RE | Admit: 2019-11-11 | Discharge: 2019-11-11 | Disposition: A | Discharge status: Home / Self Care | Payer: Medicare Other | Source: Ambulatory Visit | Attending: Urology | Admitting: Urology

## 2019-11-11 ENCOUNTER — Encounter (HOSPITAL_COMMUNITY): Payer: Self-pay | Admitting: Physician Assistant

## 2019-11-11 ENCOUNTER — Other Ambulatory Visit: Payer: Self-pay

## 2019-11-11 DIAGNOSIS — Z20822 Contact with and (suspected) exposure to covid-19: Secondary | ICD-10-CM | POA: Diagnosis not present

## 2019-11-11 DIAGNOSIS — Z01812 Encounter for preprocedural laboratory examination: Secondary | ICD-10-CM | POA: Insufficient documentation

## 2019-11-11 LAB — COMPREHENSIVE METABOLIC PANEL
ALT: 22 U/L (ref 0–44)
AST: 31 U/L (ref 15–41)
Albumin: 3.9 g/dL (ref 3.5–5.0)
Alkaline Phosphatase: 60 U/L (ref 38–126)
Anion gap: 9 (ref 5–15)
BUN: 19 mg/dL (ref 8–23)
CO2: 26 mmol/L (ref 22–32)
Calcium: 8.9 mg/dL (ref 8.9–10.3)
Chloride: 105 mmol/L (ref 98–111)
Creatinine, Ser: 1.4 mg/dL — ABNORMAL HIGH (ref 0.61–1.24)
GFR calc Af Amer: 56 mL/min — ABNORMAL LOW (ref >60)
GFR calc non Af Amer: 48 mL/min — ABNORMAL LOW (ref >60)
Glucose, Bld: 104 mg/dL — ABNORMAL HIGH (ref 70–99)
Potassium: 4.4 mmol/L (ref 3.5–5.1)
Sodium: 140 mmol/L (ref 135–145)
Total Bilirubin: 1.4 mg/dL — ABNORMAL HIGH (ref 0.3–1.2)
Total Protein: 6.8 g/dL (ref 6.5–8.1)

## 2019-11-11 LAB — APTT: aPTT: 28 seconds (ref 24–36)

## 2019-11-11 LAB — CBC
HCT: 44.3 % (ref 39.0–52.0)
Hemoglobin: 14.5 g/dL (ref 13.0–17.0)
MCH: 29.7 pg (ref 26.0–34.0)
MCHC: 32.7 g/dL (ref 30.0–36.0)
MCV: 90.6 fL (ref 80.0–100.0)
Platelets: 153 10*3/uL (ref 150–400)
RBC: 4.89 MIL/uL (ref 4.22–5.81)
RDW: 14.9 % (ref 11.5–15.5)
WBC: 6.3 10*3/uL (ref 4.0–10.5)
nRBC: 0 % (ref 0.0–0.2)

## 2019-11-11 LAB — SARS CORONAVIRUS 2 (TAT 6-24 HRS): SARS Coronavirus 2: NEGATIVE

## 2019-11-11 LAB — PROTIME-INR
INR: 1 (ref 0.8–1.2)
Prothrombin Time: 13.2 seconds (ref 11.4–15.2)

## 2019-11-12 ENCOUNTER — Telehealth: Payer: Self-pay | Admitting: *Deleted

## 2019-11-12 NOTE — H&P (Signed)
CC/HPI: Prostate cancer: PSA - 11.9, DRE - firm lateral aspect of the right lobe.  TRUS/Bx 07/09/19: Prostate volume - 23 cc.  Pathology: Adenocarcinoma 3+4 in 10% of 1 core from the right lobe posterior lateral.  Stage: T1c (favorable, intermediate risk)   07/16/19: The patient returns having undergone a recent prostate biopsy. He stopped his Eliquis for the procedure. Two days after he restarted but saw a little bit more blood so he said he held off on it. I recommended he get started back on the medication.    10/23/2019: Patient has had quite a tumultuous past couple of months requiring treatment for pneumonia as well as the COVID-19 virus. Beginning about 1 month ago he developed worsening lower urinary tract symptoms in the form of increased frequency/urgency and nocturia as well as painful and burning urination. He was evaluated in the emergency department and diagnosed with a UTI, E coli resistant to Bactrim, Cipro, and penicillins. He was placed on cephalexin which gradually improved his symptoms only to recur later in the month. Recently evaluated by PCP who placed him on Cipro which he just finished yesterday. Urine culture assessed at that time showed less than 10,000 colonies of nonspecific growth. He continues to be moderately symptomatic with continued burning and painful urination, passage of what he describes as pyuria but no gross hematuria. Denies any specific trouble starting his stream but it is quite painful with voiding. Again also associated with increased frequency/urgency and nocturia. Not associated with lower back pain or flank pain/discomfort. He has also had some low-grade intermittent fevers but that has resolved by his report.    He is scheduled to undergo radioactive seed implant on 07/21 for definitive treatment of prostate cancer.   11/05/2019: At time of last office visit I placed the patient on tamsulosin and prescribed a prolonged course of doxycycline which he will  complete later today. Urine culture sent at time of last office visit showed mixed growth with some gross contamination present. Fortunately over the past 2 weeks he has had dramatic improvement in regards to bothersome lower urinary tract symptoms. No longer having any painful or burning urination or passage of pyuria. He is not had any interval passage of gross hematuria. He has noted improvement in force of stream as well as decreased frequency/urgency from baseline. Now only getting up once per night on average but sometimes he has not gotten up at all. He denies any interval recurrence of fevers or chills, nausea/vomiting. He is tolerating a normal diet without constipation.   Of note patient wearing nasal cannula O2 therapy today which he does intermittently, especially at night due to previously suffering respiratory compromise due to be covid 19 virus as well as correlating pneumonia.     ALLERGIES: Altace    MEDICATIONS: Aspirin  Doxycycline Hyclate 100 mg tablet 1 tablet PO BID  Omeprazole 20 mg tablet, delayed release  Tamsulosin Hcl 0.4 mg capsule 1 capsule PO Daily  Acyclovir  Atorvastatin Calcium  Cetirizine Hcl  Folic Acid  Furosemide 40 mg tablet  Isosorbide Mononitrate  Multivitamin  Ondansetron Hcl  Potassium  Vitamin C  Vitamin D3     GU PSH: Prostate Needle Biopsy - 07/09/2019     NON-GU PSH: Carpal tunnel surgery Explore Spinal Fusion Foot surgery (unspecified) Nose Surgery (Unspecified) Rotator cuff surgery Surgical Pathology, Gross And Microscopic Examination For Prostate Needle - 07/09/2019         GU PMH: Acute Cystitis/UTI - 10/23/2019 Prostate Cancer, He has  elected to proceed with radioactive seed implant. - 07/16/2019 Prostate nodule w/o LUTS, He has a firm nodule running along the lateral aspect of the right lobe of his prostate. This is concerning for prostate cancer with his elevated PSA. - 06/11/2019 BPH w/o LUTS, He does have BPH by exam. He has no  significant voiding symptoms. - 12/18/2018 Elevated PSA, Discussed the fact that his elevated PSA certainly could indicate the presence of prostate cancer although it tends to be slow growing enough that I think close observation for now with the plan to perform a prostate biopsy if it continues to go up once he has been on his Eliquis for 6 months unless it should begin to rise at a greater rate as I believe taking him off his Eliquis would likely place him in a greater risk than the risk of holding off on his biopsy for some time. He is in agreement with this plan. - 12/18/2018    NON-GU PMH: Arthritis Cardiac murmur, unspecified Encounter for general adult medical examination without abnormal findings, Encounter for preventive health examination GERD Heart disease, unspecified Personal history of other diseases of the circulatory system    FAMILY HISTORY: 2 daughters - Runs in Family 2 sons - Runs in Family   SOCIAL HISTORY: Marital Status: Married Preferred Language: English; Ethnicity: Not Hispanic Or Latino; Race: White Current Smoking Status: Patient does not smoke anymore.  <DIV'  Tobacco Use Assessment Completed:  Used Tobacco in last 30 days?   Does drink.  Does not drink caffeine.    REVIEW OF SYSTEMS:    GU Review Male:  Patient reports frequent urination and get up at night to urinate. Patient denies stream starts and stops, leakage of urine, trouble starting your stream, hard to postpone urination, penile pain, have to strain to urinate , burning/ pain with urination, and erection problems.   Gastrointestinal (Upper):  Patient denies nausea, vomiting, and indigestion/ heartburn.   Gastrointestinal (Lower):  Patient denies diarrhea and constipation.   Constitutional:  Patient denies fever, night sweats, weight loss, and fatigue.   Skin:  Patient denies skin rash/ lesion and itching.   Eyes:  Patient denies blurred vision and double vision.   Ears/ Nose/ Throat:  Patient denies  sore throat and sinus problems.   Hematologic/Lymphatic:  Patient denies swollen glands and easy bruising.   Cardiovascular:  Patient denies leg swelling and chest pains.   Respiratory:  Patient reports shortness of breath. Patient denies cough.   Endocrine:  Patient denies excessive thirst.   Musculoskeletal:  Patient denies back pain and joint pain.   Neurological:  Patient denies headaches and dizziness.   Psychologic:  Patient denies depression and anxiety.   VITAL SIGNS:      11/05/2019 10:11 AM    Weight 200.3 lb / 90.85 kg    Height 70 in / 177.8 cm    BP 107/69 mmHg    Pulse 83 /min    Temperature 98.0 F / 36.6 C    BMI 28.7 kg/m    MULTI-SYSTEM PHYSICAL EXAMINATION:     Constitutional: Well-nourished. No physical deformities. Normally developed. Good grooming.    Neck: Neck symmetrical, not swollen. Normal tracheal position.    Respiratory: No labored breathing, no use of accessory muscles. Pt using cont. [place] 02 therapy. This is a result of pulmonary deconditioning related to Covid 19 which he suffered from earlier this year.    Cardiovascular: Normal temperature, normal extremity pulses, no swelling, no varicosities.  Skin: No paleness, no jaundice, no cyanosis. No lesion, no ulcer, no rash.    Neurologic / Psychiatric: Oriented to time, oriented to place, oriented to person. No depression, no anxiety, no agitation.    Gastrointestinal: No mass, no tenderness, no rigidity, non obese abdomen. NO CVA or flank tenderness.    Musculoskeletal: Normal gait and station of head and neck.          Complexity of Data:   Source Of History:  Patient, Medical Record Summary  Lab Test Review:  PSA  Records Review:  Pathology Reports, Previous Doctor Records, Previous Hospital Records, Previous Patient Records  Urine Test Review:  Urinalysis, Urine Culture  Urodynamics Review:  Review Bladder Scan    06/05/19 03/21/19 10/06/18 06/27/18  PSA  Total PSA 11.90 ng/mL 5.77 ng/mL 5.70  ng/dl 5.52 ng/dl  Free PSA  0.92 ng/mL    % Free PSA  16 % PSA      11/05/19  Urinalysis  Urine Appearance Clear   Urine Color Yellow   Urine Glucose Neg mg/dL  Urine Bilirubin Neg mg/dL  Urine Ketones Neg mg/dL  Urine Specific Gravity 1.015   Urine Blood Neg ery/uL  Urine pH 6.5   Urine Protein Neg mg/dL  Urine Urobilinogen 0.2 mg/dL  Urine Nitrites Neg   Urine Leukocyte Esterase Neg leu/uL   PROCEDURES:    PVR Ultrasound - 51798  Scanned Volume: 121 cc    Urinalysis  Dipstick Dipstick Cont'd  Color: Yellow Bilirubin: Neg mg/dL  Appearance: Clear Ketones: Neg mg/dL  Specific Gravity: 1.015 Blood: Neg ery/uL  pH: 6.5 Protein: Neg mg/dL  Glucose: Neg mg/dL Urobilinogen: 0.2 mg/dL   Nitrites: Neg   Leukocyte Esterase: Neg leu/uL    ASSESSMENT:     ICD-10 Details  1 GU:  Acute Cystitis/UTI - N30.00 Acute, Improving  2  Prostate Cancer - C61 Chronic, Stable   PLAN:   Orders  Labs Urine Culture  Schedule  Return Visit/Planned Activity: 5 Weeks - Office Visit, PVR  Note: Alex [name]  Return Visit/Planned Activity: Keep Scheduled Appointment - Schedule Surgery, Follow up MD  Document  Letter(s):  Created for Patient: Clinical Summary   Notes:  Patient's baseline voiding symptoms have significantly improved with continued use of antibiotics for underlying cystitis as well as adding tamsulosin to help with obstructive voiding symptoms at baseline. He will continue doxycycline as directed with him taking his last dose later today. He will continue tamsulosin as well until follow-up as I a 1 him to remain on the medication to help augment expected increased irritative/obstructive voiding symptoms as result of his upcoming radioactive seed implant procedure. He may continue to use [place] 0 as needed. Recommended remaining well-hydrated and continuing to void on a schedule. Bowel management strategies to help prevent constipation were also discussed. I will send a message to his  urologist about continuing or other restarting antimicrobial therapy prior to his upcoming procedure. Repeat urine culture sent today out of precaution. I plan to have him back in 5 weeks for repeat assessment to see how he is doing post radioactive seed implant. All questions answered to the best of my ability about upcoming procedure and expected postoperative course with understanding expressed by the patient and his wife.

## 2019-11-12 NOTE — Telephone Encounter (Signed)
CALLED PATIENT TO REMIND OF PROCEDURE FOR 11-13-19, LVM FOR A RETURN CALL

## 2019-11-13 ENCOUNTER — Ambulatory Visit (HOSPITAL_BASED_OUTPATIENT_CLINIC_OR_DEPARTMENT_OTHER)
Admission: RE | Admit: 2019-11-13 | Discharge: 2019-11-13 | Disposition: A | Discharge status: Home / Self Care | Payer: Medicare Other | Source: Ambulatory Visit | Attending: Urology | Admitting: Urology

## 2019-11-13 ENCOUNTER — Ambulatory Visit (HOSPITAL_COMMUNITY): Payer: Medicare Other

## 2019-11-13 ENCOUNTER — Ambulatory Visit (HOSPITAL_BASED_OUTPATIENT_CLINIC_OR_DEPARTMENT_OTHER): Payer: Medicare Other | Admitting: Physician Assistant

## 2019-11-13 ENCOUNTER — Encounter (HOSPITAL_BASED_OUTPATIENT_CLINIC_OR_DEPARTMENT_OTHER): Payer: Self-pay | Admitting: Urology

## 2019-11-13 ENCOUNTER — Encounter (HOSPITAL_BASED_OUTPATIENT_CLINIC_OR_DEPARTMENT_OTHER)
Admission: RE | Disposition: A | Discharge status: Home / Self Care | Payer: Self-pay | Source: Ambulatory Visit | Attending: Urology

## 2019-11-13 ENCOUNTER — Other Ambulatory Visit: Payer: Self-pay

## 2019-11-13 DIAGNOSIS — G4730 Sleep apnea, unspecified: Secondary | ICD-10-CM | POA: Insufficient documentation

## 2019-11-13 DIAGNOSIS — Z87891 Personal history of nicotine dependence: Secondary | ICD-10-CM | POA: Diagnosis not present

## 2019-11-13 DIAGNOSIS — I10 Essential (primary) hypertension: Secondary | ICD-10-CM | POA: Diagnosis not present

## 2019-11-13 DIAGNOSIS — E785 Hyperlipidemia, unspecified: Secondary | ICD-10-CM | POA: Diagnosis not present

## 2019-11-13 DIAGNOSIS — Z9221 Personal history of antineoplastic chemotherapy: Secondary | ICD-10-CM | POA: Insufficient documentation

## 2019-11-13 DIAGNOSIS — C61 Malignant neoplasm of prostate: Secondary | ICD-10-CM | POA: Insufficient documentation

## 2019-11-13 DIAGNOSIS — K219 Gastro-esophageal reflux disease without esophagitis: Secondary | ICD-10-CM | POA: Insufficient documentation

## 2019-11-13 DIAGNOSIS — I2510 Atherosclerotic heart disease of native coronary artery without angina pectoris: Secondary | ICD-10-CM | POA: Diagnosis not present

## 2019-11-13 DIAGNOSIS — Z8616 Personal history of COVID-19: Secondary | ICD-10-CM | POA: Diagnosis not present

## 2019-11-13 DIAGNOSIS — Z79899 Other long term (current) drug therapy: Secondary | ICD-10-CM | POA: Diagnosis not present

## 2019-11-13 HISTORY — DX: Obstructive sleep apnea (adult) (pediatric): G47.33

## 2019-11-13 HISTORY — DX: Personal history of colonic polyps: Z86.010

## 2019-11-13 HISTORY — DX: Drug-induced polyneuropathy: G62.0

## 2019-11-13 HISTORY — DX: Personal history of Methicillin resistant Staphylococcus aureus infection: Z86.14

## 2019-11-13 HISTORY — PX: CYSTOSCOPY: SHX5120

## 2019-11-13 HISTORY — PX: RADIOACTIVE SEED IMPLANT: SHX5150

## 2019-11-13 HISTORY — PX: SPACE OAR INSTILLATION: SHX6769

## 2019-11-13 HISTORY — DX: Presence of external hearing-aid: Z97.4

## 2019-11-13 HISTORY — DX: Light chain (AL) amyloidosis: E85.81

## 2019-11-13 HISTORY — DX: Personal history of other malignant neoplasm of skin: Z85.828

## 2019-11-13 HISTORY — DX: Personal history of other specified conditions: Z87.898

## 2019-11-13 HISTORY — DX: Dependence on supplemental oxygen: Z99.81

## 2019-11-13 HISTORY — DX: Nocturia: R35.1

## 2019-11-13 HISTORY — DX: Personal history of colon polyps, unspecified: Z86.0100

## 2019-11-13 HISTORY — DX: Presence of spectacles and contact lenses: Z97.3

## 2019-11-13 HISTORY — DX: Shortness of breath: R06.02

## 2019-11-13 HISTORY — DX: Drug-induced polyneuropathy: T45.1X5A

## 2019-11-13 HISTORY — DX: Chronic kidney disease, stage 3 unspecified: N18.30

## 2019-11-13 SURGERY — INSERTION, RADIATION SOURCE, PROSTATE
Anesthesia: General | Site: Rectum

## 2019-11-13 MED ORDER — PROPOFOL 10 MG/ML IV BOLUS
INTRAVENOUS | Status: DC | PRN
  Administered 2019-11-13: 110 mg via INTRAVENOUS
  Administered 2019-11-13: 40 mg via INTRAVENOUS

## 2019-11-13 MED ORDER — SODIUM CHLORIDE 0.9 % IV SOLN
INTRAVENOUS | Status: AC | PRN
  Administered 2019-11-13: 200 mL via INTRAMUSCULAR

## 2019-11-13 MED ORDER — CIPROFLOXACIN IN D5W 400 MG/200ML IV SOLN
400.0000 mg | INTRAVENOUS | Status: AC
  Administered 2019-11-13: 400 mg via INTRAVENOUS

## 2019-11-13 MED ORDER — CEPHALEXIN 500 MG PO CAPS: 500.0000 mg | ORAL_CAPSULE | Freq: Three times a day (TID) | ORAL | 0 refills | Status: AC

## 2019-11-13 MED ORDER — OXYCODONE HCL 5 MG/5ML PO SOLN: 5.0000 mg | Freq: Once | ORAL | Status: DC | PRN

## 2019-11-13 MED ORDER — LIDOCAINE 2% (20 MG/ML) 5 ML SYRINGE
INTRAMUSCULAR | Status: AC
  Filled 2019-11-13: qty 5

## 2019-11-13 MED ORDER — MIDAZOLAM HCL 2 MG/2ML IJ SOLN
INTRAMUSCULAR | Status: AC
  Filled 2019-11-13: qty 2

## 2019-11-13 MED ORDER — PROPOFOL 10 MG/ML IV BOLUS
INTRAVENOUS | Status: AC
  Filled 2019-11-13: qty 20

## 2019-11-13 MED ORDER — IOHEXOL 300 MG/ML  SOLN
INTRAMUSCULAR | Status: DC | PRN
  Administered 2019-11-13: 7 mL via URETHRAL

## 2019-11-13 MED ORDER — LIDOCAINE HCL 1 % IJ SOLN
INTRAMUSCULAR | Status: DC | PRN
  Administered 2019-11-13: 50 mg via INTRADERMAL

## 2019-11-13 MED ORDER — SODIUM CHLORIDE (PF) 0.9 % IJ SOLN
INTRAMUSCULAR | Status: DC | PRN
  Administered 2019-11-13: 10 mL

## 2019-11-13 MED ORDER — ONDANSETRON HCL 4 MG/2ML IJ SOLN: 4.0000 mg | Freq: Once | INTRAMUSCULAR | Status: DC | PRN

## 2019-11-13 MED ORDER — CIPROFLOXACIN IN D5W 400 MG/200ML IV SOLN
INTRAVENOUS | Status: AC
  Filled 2019-11-13: qty 200

## 2019-11-13 MED ORDER — DEXAMETHASONE SODIUM PHOSPHATE 4 MG/ML IJ SOLN
INTRAMUSCULAR | Status: DC | PRN
  Administered 2019-11-13: 5 mg via INTRAVENOUS

## 2019-11-13 MED ORDER — STERILE WATER FOR IRRIGATION IR SOLN
Status: DC | PRN
  Administered 2019-11-13: 3 mL

## 2019-11-13 MED ORDER — SODIUM CHLORIDE 0.9 % IV SOLN
INTRAVENOUS | Status: DC
  Administered 2019-11-13: 50 mL via INTRAVENOUS

## 2019-11-13 MED ORDER — STERILE WATER FOR IRRIGATION IR SOLN: Status: DC | PRN

## 2019-11-13 MED ORDER — ONDANSETRON HCL 4 MG/2ML IJ SOLN
INTRAMUSCULAR | Status: DC | PRN
  Administered 2019-11-13: 4 mg via INTRAVENOUS

## 2019-11-13 MED ORDER — FENTANYL CITRATE (PF) 100 MCG/2ML IJ SOLN
INTRAMUSCULAR | Status: DC | PRN
  Administered 2019-11-13: 50 ug via INTRAVENOUS
  Administered 2019-11-13 (×2): 25 ug via INTRAVENOUS
  Administered 2019-11-13: 50 ug via INTRAVENOUS

## 2019-11-13 MED ORDER — FENTANYL CITRATE (PF) 100 MCG/2ML IJ SOLN: 25.0000 ug | INTRAMUSCULAR | Status: DC | PRN

## 2019-11-13 MED ORDER — FLEET ENEMA 7-19 GM/118ML RE ENEM: 1.0000 | ENEMA | Freq: Once | RECTAL | Status: DC

## 2019-11-13 MED ORDER — FENTANYL CITRATE (PF) 100 MCG/2ML IJ SOLN
INTRAMUSCULAR | Status: AC
  Filled 2019-11-13: qty 2

## 2019-11-13 MED ORDER — ONDANSETRON HCL 4 MG/2ML IJ SOLN
INTRAMUSCULAR | Status: AC
  Filled 2019-11-13: qty 2

## 2019-11-13 MED ORDER — OXYCODONE HCL 5 MG PO TABS: 5.0000 mg | ORAL_TABLET | Freq: Once | ORAL | Status: DC | PRN

## 2019-11-13 MED ORDER — MIDAZOLAM HCL 5 MG/5ML IJ SOLN
INTRAMUSCULAR | Status: DC | PRN
  Administered 2019-11-13: .5 mg via INTRAVENOUS

## 2019-11-13 MED ORDER — DEXAMETHASONE SODIUM PHOSPHATE 10 MG/ML IJ SOLN
INTRAMUSCULAR | Status: AC
  Filled 2019-11-13: qty 1

## 2019-11-13 SURGICAL SUPPLY — 37 items
BAG DRN RND TRDRP ANRFLXCHMBR (UROLOGICAL SUPPLIES) ×3
BAG URINE DRAIN 2000ML AR STRL (UROLOGICAL SUPPLIES) ×4 IMPLANT
BLADE CLIPPER SENSICLIP SURGIC (BLADE) ×4 IMPLANT
CATH FOLEY 2WAY SLVR  5CC 16FR (CATHETERS) ×4
CATH FOLEY 2WAY SLVR 5CC 16FR (CATHETERS) ×3 IMPLANT
CATH ROBINSON RED A/P 16FR (CATHETERS) IMPLANT
CATH ROBINSON RED A/P 20FR (CATHETERS) ×4 IMPLANT
CLOTH BEACON ORANGE TIMEOUT ST (SAFETY) ×4 IMPLANT
CNTNR URN SCR LID CUP LEK RST (MISCELLANEOUS) ×6 IMPLANT
CONT SPEC 4OZ STRL OR WHT (MISCELLANEOUS) ×8
COVER BACK TABLE 60X90IN (DRAPES) ×4 IMPLANT
COVER MAYO STAND STRL (DRAPES) ×4 IMPLANT
DRAPE C-ARM 35X43 STRL (DRAPES) ×4 IMPLANT
DRAPE U-SHAPE 47X51 STRL (DRAPES) IMPLANT
DRSG TEGADERM 4X4.75 (GAUZE/BANDAGES/DRESSINGS) ×4 IMPLANT
DRSG TEGADERM 8X12 (GAUZE/BANDAGES/DRESSINGS) ×4 IMPLANT
GAUZE SPONGE 4X4 12PLY STRL LF (GAUZE/BANDAGES/DRESSINGS) ×4 IMPLANT
GLOVE BIO SURGEON STRL SZ 6.5 (GLOVE) ×8 IMPLANT
GLOVE BIO SURGEON STRL SZ7.5 (GLOVE) IMPLANT
GLOVE BIO SURGEON STRL SZ8 (GLOVE) ×8 IMPLANT
GLOVE SURG ORTHO 8.5 STRL (GLOVE) IMPLANT
GLOVE SURG SS PI 6.5 STRL IVOR (GLOVE) IMPLANT
GOWN STRL REUS W/TWL XL LVL3 (GOWN DISPOSABLE) ×4 IMPLANT
HOLDER FOLEY CATH W/STRAP (MISCELLANEOUS) IMPLANT
I-Seed AgX100 ×296 IMPLANT
IMPL SPACEOAR VUE SYSTEM (Spacer) ×3 IMPLANT
IMPLANT SPACEOAR VUE SYSTEM (Spacer) ×4 IMPLANT
IV NS 1000ML (IV SOLUTION) ×4
IV NS 1000ML BAXH (IV SOLUTION) ×3 IMPLANT
KIT TURNOVER CYSTO (KITS) ×4 IMPLANT
MARKER SKIN DUAL TIP RULER LAB (MISCELLANEOUS) ×4 IMPLANT
PACK CYSTO (CUSTOM PROCEDURE TRAY) ×4 IMPLANT
SUT BONE WAX W31G (SUTURE) IMPLANT
SYR 10ML LL (SYRINGE) ×8 IMPLANT
TOWEL OR 17X26 10 PK STRL BLUE (TOWEL DISPOSABLE) ×4 IMPLANT
UNDERPAD 30X36 HEAVY ABSORB (UNDERPADS AND DIAPERS) ×8 IMPLANT
WATER STERILE IRR 500ML POUR (IV SOLUTION) ×4 IMPLANT

## 2019-11-13 NOTE — Op Note (Signed)
PATIENT:  Alex [name].  PRE-OPERATIVE DIAGNOSIS:  Adenocarcinoma of the prostate  POST-OPERATIVE DIAGNOSIS:  Same  PROCEDURE:  1. I-125 radioactive seed implantation 2. Cystoscopy  3. Placement of SpaceOAR  SURGEON: [name], MD  Radiation oncologist: Dr. Tyler Pita  ANESTHESIA:  General  EBL:  Minimal  DRAINS: 27 French Foley catheter  INDICATION: Alex [name]. is a 79 yo man with pT1c prostate cancer who has decided on brachytherapy for prostate cancer treatment.   Description of procedure: After informed consent the patient was brought to the major OR, placed on the table and administered general anesthesia. He was then moved to the modified lithotomy position with his perineum perpendicular to the floor. His perineum and genitalia were then sterilely prepped. An official timeout was then performed. A 16 French Foley catheter was then placed in the bladder and filled with dilute contrast, a rectal tube was placed in the rectum and the transrectal ultrasound probe was placed in the rectum and affixed to the stand. He was then sterilely draped.  Real time ultrasonography was used along with the seed planning software [employer]state. This was used to develop the seed plan including the number of needles as well as number of seeds required for complete and adequate coverage. Real-time ultrasonography was then used along with the previously developed plan and the Nucletron device to implant a total of 74 seeds using 22 needles. This proceeded without difficulty or complication.   I then proceeded with placement of SpaceOARby introducing a needle with the bevel angled inferiorly approximately 2 cm superior to the anus. This was angled downward and under direct ultrasound was placed within the space between the prostatic capsule and rectum. This was confirmed with a small amount of sterile saline injected and this was performed under direct ultrasound. I then  attached the SpaceOARto the needle and injected this in the space between the prostate and rectum with good placement noted.  A Foley catheter was then removed as well as the transrectal ultrasound probe and rectal probe. Flexible cystoscopy was then performed using the 17 French flexible scope which revealed a normal urethra throughout its length down to the sphincter which appeared intact. The prostatic urethra revealed bilobar hypertrophy but no evidence of obstruction, seeds, spacers or lesions. The bladder was then entered and fully and systematically inspected. The ureteral orifices were noted to be of normal configuration and position. The mucosa revealed no evidence of tumors. There were also no stones identified within the bladder. I noted no seeds or spacers on the floor of the bladder and retroflexion of the scope revealed no seeds protruding from the base of the prostate.  The cystoscope was then removed and the patient was awakened and taken to recovery room in stable and satisfactory condition. He tolerated procedure well and there were no intraoperative complications.

## 2019-11-13 NOTE — Anesthesia Procedure Notes (Signed)
Procedure Name: LMA Insertion Date/Time: 11/13/2019 12:54 PM Performed by: Garrel Ridgel, CRNA Pre-anesthesia Checklist: Patient identified, Emergency Drugs available, Suction available, Patient being monitored and Timeout performed Patient Re-evaluated:Patient Re-evaluated prior to induction Oxygen Delivery Method: Circle system utilized Preoxygenation: Pre-oxygenation with 100% oxygen Induction Type: IV induction Ventilation: Mask ventilation without difficulty LMA: LMA inserted LMA Size: 5.0 Number of attempts: 1 Placement Confirmation: positive ETCO2 and breath sounds checked- equal and bilateral Tube secured with: Tape Dental Injury: Teeth and Oropharynx as per pre-operative assessment

## 2019-11-13 NOTE — Discharge Instructions (Signed)

## 2019-11-13 NOTE — Anesthesia Postprocedure Evaluation (Signed)
Anesthesia Post Note  Patient: Alex [name].  Procedure(s) Performed: RADIOACTIVE SEED IMPLANT/BRACHYTHERAPY IMPLANT (N/A Prostate) SPACE OAR INSTILLATION (N/A Rectum) CYSTOSCOPY FLEXIBLE (N/A Bladder)     Patient location during evaluation: PACU Anesthesia Type: General Level of consciousness: awake and alert Pain management: pain level controlled Vital Signs Assessment: post-procedure vital signs reviewed and stable Respiratory status: spontaneous breathing, nonlabored ventilation and respiratory function stable Cardiovascular status: blood pressure returned to baseline and stable Postop Assessment: no apparent nausea or vomiting Anesthetic complications: no   No complications documented.  Last Vitals:  Vitals:   11/13/19 1445 11/13/19 1500  BP: (!) 141/89 (!) 157/97  Pulse: (!) 59 (!) 58  Resp: 19 18  Temp:    SpO2: 100% 99%    Last Pain:  Vitals:   11/13/19 1500  TempSrc:   PainSc: 0-No pain                 Lidia Collum

## 2019-11-13 NOTE — Anesthesia Preprocedure Evaluation (Addendum)
Anesthesia Evaluation  Patient identified by MRN, date of birth, ID band Patient awake    Reviewed: Allergy & Precautions, NPO status , Patient's Chart, lab work & pertinent test results  History of Anesthesia Complications Negative for: history of anesthetic complications  Airway Mallampati: II  TM Distance: >3 FB Neck ROM: Full    Dental  (+) Teeth Intact   Pulmonary shortness of breath and Long-Term Oxygen Therapy, sleep apnea , former smoker,  Post-COVID pulmonary fibrosis Acute COVID infection 10/2018 Home O2 at night and PRN w/ activity   Pulmonary exam normal        Cardiovascular hypertension, + CAD (nonobstructive by 2011 cath)  Normal cardiovascular exam+ Valvular Problems/Murmurs (valve area 1.50, MG 11.4 (mild)) AS      Neuro/Psych negative neurological ROS  negative psych ROS   GI/Hepatic Neg liver ROS, GERD  ,  Endo/Other  negative endocrine ROS  Renal/GU Renal InsufficiencyRenal disease  negative genitourinary   Musculoskeletal negative musculoskeletal ROS (+)   Abdominal   Peds  Hematology negative hematology ROS (+)   Anesthesia Other Findings  AL amyloidosis s/p chemo and stem cell transplant  Per cardiology preoperative risk assessment, "Chart reviewed as part of pre-operative protocol coverage. Patient was contacted7/16/2021in reference to pre-operative risk assessment for pending surgery as outlined below. Alex Nelsonwas last seen on 09/16/19 by Dr. [name]. He has history ofsyndrome, HTN, moderate non-obstructive CAD by cath 2011,mildaortic stenosis, GERD, hyperlipidemia, mildcarotid artery disease, amyloidosis treated with chemotherapy, Covid-19 in July 2020 with respiratory failure and PE (treated with course of Eliquis). Cath 2011 with nonobstructive disease. Nuc stress in 2016 was normal. At recent OV 08/2019 he reported exertional dyspnea ever since Covid as well as  occasional chest pain, never lasting more than a minute. I spoke with patient today to discuss symptoms. He reports the exertional dyspnea is still there, and he also notices he sometimes get chest discomfort towards the end of when his breathing gets short. He is able to rest and the symptoms resolve.He reports this symptom is stable ever since he contracted Covid last year, and was present during recent eval 08/2019 without any recent exacerbations. He has not had any rest chest pain. CT 10/2019 showed continued interstitial lung disease. I spoke with Dr. [name] about him. RCRI 0.9% indicating low risk of CV complications. Dr. [name] has felt his symptoms are likely post-Covid related and feels he is OK to proceed with surgery without further cardiovascular testing. He feels the patient is OK to hold aspirin as requested. I will route to callback team to call patient to let him know. It would also be helpful to have him have follow-up in our office again in a few weeks to make sure symptoms are stable/improving."  Reproductive/Obstetrics                            Anesthesia Physical Anesthesia Plan  ASA: III  Anesthesia Plan: General   Post-op Pain Management:    Induction: Intravenous  PONV Risk Score and Plan: 2 and Ondansetron, Dexamethasone, Midazolam and Treatment may vary due to age or medical condition  Airway Management Planned: LMA  Additional Equipment: None  Intra-op Plan:   Post-operative Plan: Extubation in OR  Informed Consent: I have reviewed the patients History and Physical, chart, labs and discussed the procedure including the risks, benefits and alternatives for the proposed anesthesia with the patient or authorized representative who has indicated his/her understanding  and acceptance.     Dental advisory given  Plan Discussed with:   Anesthesia Plan Comments:       Anesthesia Quick Evaluation

## 2019-11-13 NOTE — Transfer of Care (Signed)
Immediate Anesthesia Transfer of Care Note  Patient: Alex [name].  Procedure(s) Performed: RADIOACTIVE SEED IMPLANT/BRACHYTHERAPY IMPLANT (N/A Prostate) SPACE OAR INSTILLATION (N/A Rectum) CYSTOSCOPY FLEXIBLE (N/A Bladder)  Patient Location: PACU  Anesthesia Type:General  Level of Consciousness: oriented, drowsy and patient cooperative  Airway & Oxygen Therapy: Patient Spontanous Breathing and Patient connected to face mask oxygen  Post-op Assessment: Report given to RN and Post -op Vital signs reviewed and stable  Post vital signs: Reviewed and stable  Last Vitals:  Vitals Value Taken Time  BP 129/80 11/13/19 1427  Temp    Pulse 57 11/13/19 1429  Resp 13 11/13/19 1429  SpO2 100 % 11/13/19 1429  Vitals shown include unvalidated device data.  Last Pain:  Vitals:   11/13/19 1024  TempSrc: Oral  PainSc: 0-No pain      Patients Stated Pain Goal: 6 (56/38/75 [id number])  Complications: No complications documented.

## 2019-11-13 NOTE — Interval H&P Note (Signed)
History and Physical Interval Note:  11/13/2019 10:31 AM  Alex [name].  has presented today for surgery, with the diagnosis of PROSTATE CANCER.  The various methods of treatment have been discussed with the patient and family. After consideration of risks, benefits and other options for treatment, the patient has consented to  Procedure(s) with comments: RADIOACTIVE SEED IMPLANT/BRACHYTHERAPY IMPLANT (N/A) - ONLY NEEDS 90 MIN FOR ALL PROCEDURES SPACE OAR INSTILLATION (N/A) as a surgical intervention.  The patient's history has been reviewed, patient examined, no change in status, stable for surgery.  I have reviewed the patient's chart and labs.  Questions were answered to the patient's satisfaction.     Genie Wenke D Concepcion Kirkpatrick

## 2019-11-14 ENCOUNTER — Encounter (HOSPITAL_BASED_OUTPATIENT_CLINIC_OR_DEPARTMENT_OTHER): Payer: Self-pay | Admitting: Urology

## 2019-11-14 ENCOUNTER — Telehealth: Payer: Self-pay | Admitting: *Deleted

## 2019-11-14 ENCOUNTER — Encounter: Payer: Self-pay | Admitting: Medical Oncology

## 2019-11-14 NOTE — Telephone Encounter (Signed)
Patient called  - LVM requesting call back regarding Amyloidosis symptoms.  Contacted patient - no answer - left VM that returning call and requesting call back

## 2019-11-15 ENCOUNTER — Other Ambulatory Visit: Payer: Self-pay | Admitting: *Deleted

## 2019-11-15 ENCOUNTER — Other Ambulatory Visit: Payer: Self-pay | Admitting: Hematology

## 2019-11-15 DIAGNOSIS — E8581 Light chain (AL) amyloidosis: Secondary | ICD-10-CM

## 2019-11-15 IMAGING — CR CHEST - 2 VIEW
2 series · 2 of 2 positions shown · non-contrast
Comparison: 11/17/2018.

CLINICAL DATA: History of PYYTC-HM related pneumonia.

EXAM:
CHEST - 2 VIEW

[x chest ap]
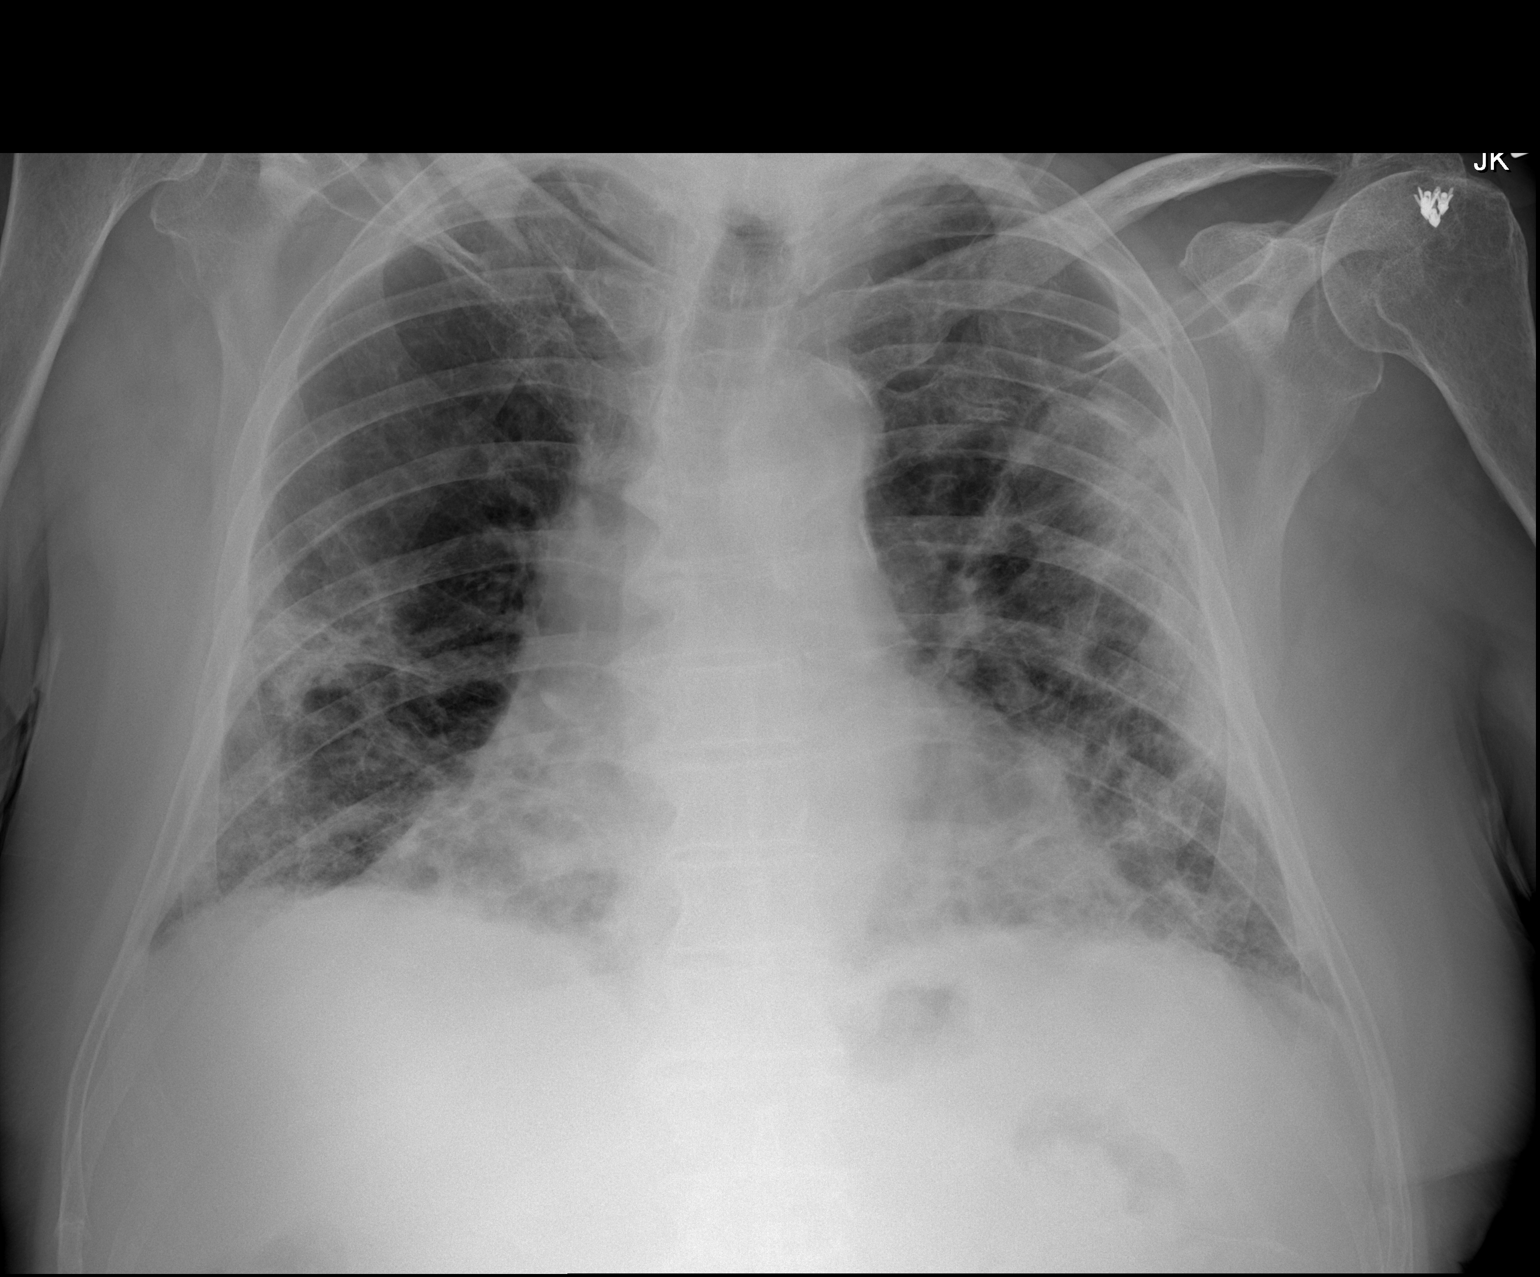

[w chest lat]
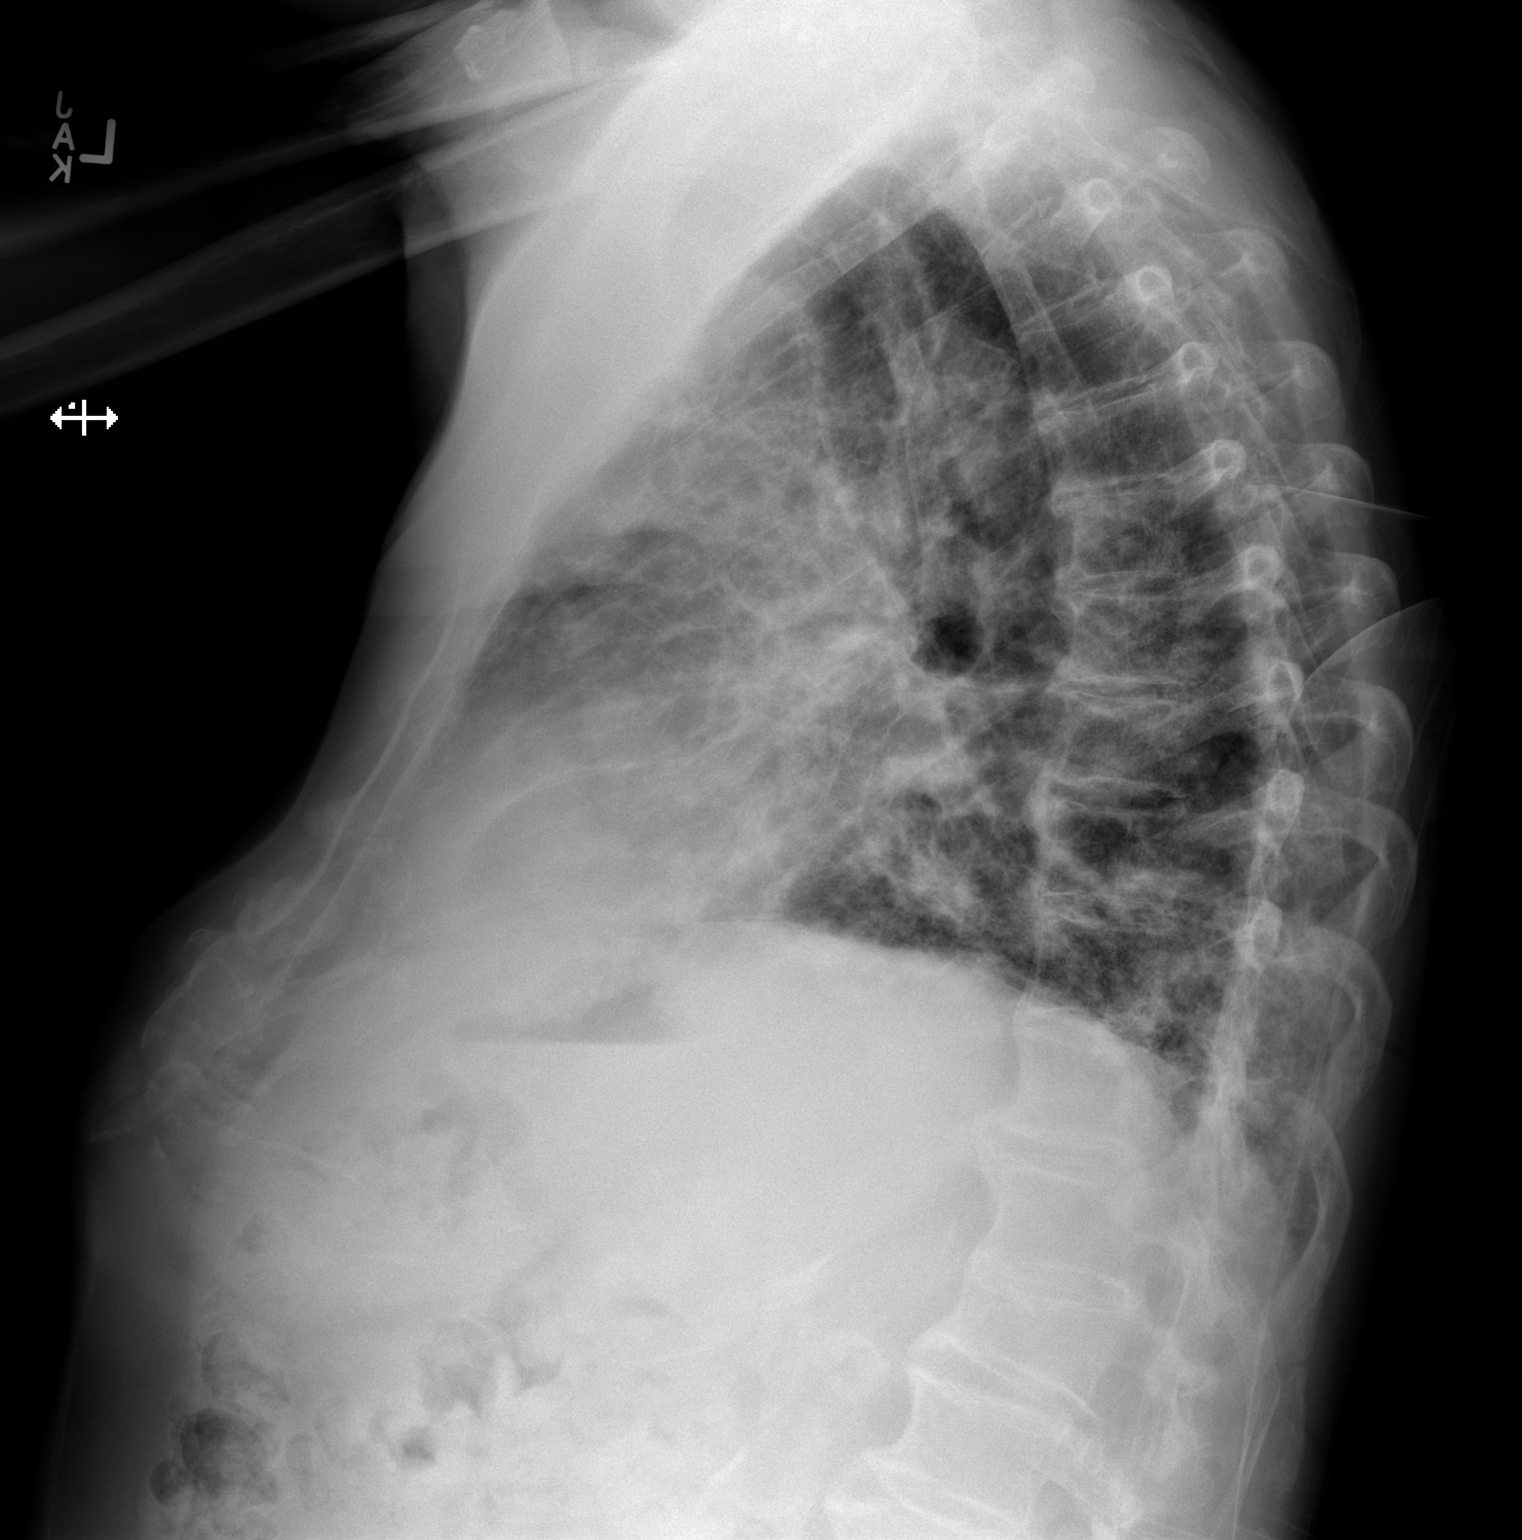

[2 of 2 positions shown; findings below may reference images not displayed]

FINDINGS: Stable cardiomegaly. Bilateral pulmonary infiltrates again noted.
Slight improvement from prior exam. Bibasilar atelectasis. Tiny
right pleural effusion cannot be excluded. No pneumothorax.
IMPRESSION: 1. Bilateral pulmonary infiltrates again noted. Slight improvement
from prior exam. Bibasilar atelectasis. Tiny right pleural effusion
cannot be excluded.

2.  Stable cardiomegaly.

## 2019-11-15 NOTE — Progress Notes (Signed)
Pt called with c/o "bubbles in urine". Lab appt was made for pt to obtain labs and 24hr urine collection. Pt is aware of lab appt date and time.

## 2019-11-16 NOTE — Progress Notes (Signed)
  Radiation Oncology         [phone]) [phone] ________________________________  Name: [name]. MRN: [id number]  Date: 11/16/2019  DOB: 1940/08/07       Prostate Seed Implant  GY:IRSW, Gwyndolyn Saxon, MD  No ref. provider found  DIAGNOSIS:  Oncology History  Malignant neoplasm of prostate ([place])  07/09/2019 Cancer Staging   Staging form: Prostate, AJCC 8th Edition - Clinical stage from 07/09/2019: Stage IIB (cT2a, cN0, cM0, PSA: 11.9, Grade Group: 2) - Signed by Freeman Caldron, [name] on 08/06/2019   08/06/2019 Initial Diagnosis   Malignant neoplasm of prostate ([place])     No diagnosis found.  PROCEDURE: Insertion of radioactive I-125 seeds into the prostate gland.  RADIATION DOSE: 145 Gy, definitive therapy.  TECHNIQUE: [name]. was brought to the operating room with the urologist. He was placed in the dorsolithotomy position. He was catheterized and a rectal tube was inserted. The perineum was shaved, prepped and draped. The ultrasound probe was then introduced into the rectum to see the prostate gland.  TREATMENT DEVICE: A needle grid was attached to the ultrasound probe stand and anchor needles were placed.  3D PLANNING: The prostate was imaged in 3D using a sagittal sweep of the prostate probe. These images were transferred to the planning computer. There, the prostate, urethra and rectum were defined on each axial reconstructed image. Then, the software created an optimized 3D plan and a few seed positions were adjusted. The quality of the plan was reviewed using [facility] information for the target and the following two organs at risk:  Urethra and Rectum.  Then the accepted plan was printed and handed off to the radiation therapist.  Under my supervision, the custom loading of the seeds and spacers was carried out and loaded into sealed vicryl sleeves.  These pre-loaded needles were then placed into the needle holder.[name] Kitchen  PROSTATE VOLUME STUDY:  Using transrectal ultrasound the  volume of the prostate was verified to be 33 cc.  SPECIAL TREATMENT PROCEDURE/SUPERVISION AND HANDLING: The pre-loaded needles were then delivered under sagittal guidance. A total of 22 needles were used to deposit 74 seeds in the prostate gland. The individual seed activity was 0.356 mCi.  SpaceOAR:  Yes  COMPLEX SIMULATION: At the end of the procedure, an anterior radiograph of the pelvis was obtained to document seed positioning and count. Cystoscopy was performed to check the urethra and bladder.  MICRODOSIMETRY: At the end of the procedure, the patient was emitting 0.089 mR/hr at 1 meter. Accordingly, he was considered safe for hospital discharge.  PLAN: The patient will return to the radiation oncology clinic for post implant CT dosimetry in three weeks.   ________________________________  Sheral Apley Tammi Klippel, M.D.

## 2019-11-18 ENCOUNTER — Inpatient Hospital Stay: Payer: Medicare Other | Attending: Hematology

## 2019-11-18 ENCOUNTER — Other Ambulatory Visit: Payer: Self-pay

## 2019-11-18 ENCOUNTER — Telehealth: Payer: Self-pay | Admitting: *Deleted

## 2019-11-18 DIAGNOSIS — E8581 Light chain (AL) amyloidosis: Secondary | ICD-10-CM

## 2019-11-18 LAB — CBC WITH DIFFERENTIAL/PLATELET
Abs Immature Granulocytes: 0.03 10*3/uL (ref 0.00–0.07)
Basophils Absolute: 0 10*3/uL (ref 0.0–0.1)
Basophils Relative: 0 %
Eosinophils Absolute: 0.2 10*3/uL (ref 0.0–0.5)
Eosinophils Relative: 3 %
HCT: 44.8 % (ref 39.0–52.0)
Hemoglobin: 14.9 g/dL (ref 13.0–17.0)
Immature Granulocytes: 0 %
Lymphocytes Relative: 24 %
Lymphs Abs: 1.8 10*3/uL (ref 0.7–4.0)
MCH: 30 pg (ref 26.0–34.0)
MCHC: 33.3 g/dL (ref 30.0–36.0)
MCV: 90.1 fL (ref 80.0–100.0)
Monocytes Absolute: 0.6 10*3/uL (ref 0.1–1.0)
Monocytes Relative: 8 %
Neutro Abs: 4.7 10*3/uL (ref 1.7–7.7)
Neutrophils Relative %: 65 %
Platelets: 131 10*3/uL — ABNORMAL LOW (ref 150–400)
RBC: 4.97 MIL/uL (ref 4.22–5.81)
RDW: 14.8 % (ref 11.5–15.5)
WBC: 7.4 10*3/uL (ref 4.0–10.5)
nRBC: 0 % (ref 0.0–0.2)

## 2019-11-18 LAB — CMP (CANCER CENTER ONLY)
ALT: 25 U/L (ref 0–44)
AST: 26 U/L (ref 15–41)
Albumin: 3.9 g/dL (ref 3.5–5.0)
Alkaline Phosphatase: 73 U/L (ref 38–126)
Anion gap: 9 (ref 5–15)
BUN: 22 mg/dL (ref 8–23)
CO2: 26 mmol/L (ref 22–32)
Calcium: 9.6 mg/dL (ref 8.9–10.3)
Chloride: 106 mmol/L (ref 98–111)
Creatinine: 1.43 mg/dL — ABNORMAL HIGH (ref 0.61–1.24)
GFR, Est AFR Am: 55 mL/min — ABNORMAL LOW (ref >60)
GFR, Estimated: 47 mL/min — ABNORMAL LOW (ref >60)
Glucose, Bld: 102 mg/dL — ABNORMAL HIGH (ref 70–99)
Potassium: 4 mmol/L (ref 3.5–5.1)
Sodium: 141 mmol/L (ref 135–145)
Total Bilirubin: 1.5 mg/dL — ABNORMAL HIGH (ref 0.3–1.2)
Total Protein: 7 g/dL (ref 6.5–8.1)

## 2019-11-18 NOTE — Telephone Encounter (Signed)
RETURNED PATIENT'S PHONE CALL, SPOKE WITH PATIENT. ?

## 2019-11-19 LAB — URINE CULTURE: Culture: NO GROWTH

## 2019-11-19 LAB — KAPPA/LAMBDA LIGHT CHAINS
Kappa free light chain: 19.9 mg/L — ABNORMAL HIGH (ref 3.3–19.4)
Kappa, lambda light chain ratio: 1.02 (ref 0.26–1.65)
Lambda free light chains: 19.5 mg/L (ref 5.7–26.3)

## 2019-11-20 DIAGNOSIS — E8581 Light chain (AL) amyloidosis: Secondary | ICD-10-CM | POA: Diagnosis not present

## 2019-11-20 LAB — MULTIPLE MYELOMA PANEL, SERUM
Albumin SerPl Elph-Mcnc: 3.4 g/dL (ref 2.9–4.4)
Albumin/Glob SerPl: 1.2 (ref 0.7–1.7)
Alpha 1: 0.2 g/dL (ref 0.0–0.4)
Alpha2 Glob SerPl Elph-Mcnc: 0.9 g/dL (ref 0.4–1.0)
B-Globulin SerPl Elph-Mcnc: 0.9 g/dL (ref 0.7–1.3)
Gamma Glob SerPl Elph-Mcnc: 0.9 g/dL (ref 0.4–1.8)
Globulin, Total: 3 g/dL (ref 2.2–3.9)
IgA: 93 mg/dL (ref 61–437)
IgG (Immunoglobin G), Serum: 942 mg/dL (ref 603–1613)
IgM (Immunoglobulin M), Srm: 56 mg/dL (ref 15–143)
Total Protein ELP: 6.4 g/dL (ref 6.0–8.5)

## 2019-11-21 LAB — UPEP/UIFE/LIGHT CHAINS/TP, 24-HR UR
% BETA, Urine: 0 %
ALPHA 1 URINE: 0 %
Albumin, U: 100 %
Alpha 2, Urine: 0 %
Free Kappa Lt Chains,Ur: 12.64 mg/L (ref 0.63–113.79)
Free Kappa/Lambda Ratio: 6.38 (ref 1.03–31.76)
Free Lambda Lt Chains,Ur: 1.98 mg/L (ref 0.47–11.77)
GAMMA GLOBULIN URINE: 0 %
Total Protein, Urine-Ur/day: 192 mg/24 hr — ABNORMAL HIGH (ref 30–150)
Total Protein, Urine: 7.1 mg/dL
Total Volume: 2700

## 2019-11-25 ENCOUNTER — Telehealth: Payer: Self-pay | Admitting: Pulmonary Disease

## 2019-11-25 NOTE — Telephone Encounter (Signed)
Spoke with patient, states that Dr. Vaughan Browner left him a VM.  Routed to Dr. Vaughan Browner.

## 2019-11-25 NOTE — Telephone Encounter (Signed)
Dr. Vaughan Browner aware that patient returned call.  He will address.

## 2019-11-25 NOTE — Telephone Encounter (Signed)
Pt returning a phone call. Pt can reached at [phone].

## 2019-11-25 NOTE — Telephone Encounter (Signed)
I called and left voicemail requesting call back to discuss CT and PFTs.

## 2019-11-26 ENCOUNTER — Telehealth: Payer: Self-pay | Admitting: *Deleted

## 2019-11-26 NOTE — Progress Notes (Signed)
Radiation Oncology         [phone]) [phone] ________________________________  Name: Alex [name]. MRN: [id number]  Date: 11/27/2019  DOB: Mar 28, 1941  Post-Seed Follow-Up Visit Note  CC: [name], MD  [name], MD  Diagnosis:   79 y.o. gentleman with Stage T2a adenocarcinoma of the prostate with Gleason Score of 3+4, and PSA of 11.9.    ICD-10-CM   1. Malignant neoplasm of prostate ([facility])  C61     Interval Since Last Radiation:  2 weeks 11/13/2019:  Insertion of radioactive I-125 seeds into the prostate gland; 145 Gy, definitive therapy with placement of SpaceOAR VUE gel.  Narrative:  The patient returns today for routine follow-up.  He is complaining of increased urinary frequency and urinary hesitation symptoms. He filled out a questionnaire regarding urinary function today providing and overall IPSS score of 24 characterizing his symptoms as severe with intermittent dysuria at the start of his stream, hesitancy, intermittency, weak flow of stream, urgency and occasional feelings of incomplete bladder emptying, particularly at night.  He specifically denies gross hematuria, fever, chills or night sweats.  His pre-implant score was 1. He denies any abdominal pain or bowel symptoms.  ALLERGIES:  is allergic to ramipril.  Meds: Current Outpatient Medications  Medication Sig Dispense Refill  . acyclovir (ZOVIRAX) 200 MG capsule Take 400 mg by mouth daily.    . Ascorbic Acid (VITAMIN C) 1000 MG tablet Take 1,000 mg by mouth 2 (two) times a day.     [name] Kitchen aspirin EC 81 MG tablet Take 81 mg by mouth daily.    [name] Kitchen atorvastatin (LIPITOR) 40 MG tablet Take 40 mg by mouth at bedtime.     [name] Kitchen b complex vitamins capsule Take 1 capsule by mouth daily.    . Cholecalciferol (D3-1000) 25 MCG (1000 UT) tablet Take 1,000 Units by mouth 2 (two) times a day.     . ezetimibe (ZETIA) 10 MG tablet Take 10 mg by mouth daily.  (Patient not taking: Reported on 11/07/2019)    . fluticasone (FLONASE) 50  MCG/ACT nasal spray Place 2 sprays into both nostrils daily. 16 g 5  . folic acid (FOLVITE) 1 MG tablet Take 1 mg by mouth at bedtime.     . furosemide (LASIX) 40 MG tablet Take 40 mg by mouth daily.     . isosorbide mononitrate (IMDUR) 15 mg TB24 24 hr tablet Take 15 mg by mouth daily.    . Multiple Vitamin (MULTI-VITAMINS) TABS Take 1 tablet by mouth daily.    . nitroGLYCERIN (NITROSTAT) 0.4 MG SL tablet Place 1 tablet (0.4 mg total) under the tongue every 5 (five) minutes as needed for chest pain. 25 tablet 3  . pantoprazole (PROTONIX) 40 MG tablet Take 40 mg by mouth daily.     . potassium chloride SA (KLOR-CON) 20 MEQ tablet Take 20 mEq by mouth daily.    . tamsulosin (FLOMAX) 0.4 MG CAPS capsule Take 0.4 mg by mouth daily.    [name] Kitchen triamcinolone cream (KENALOG) 0.1 % Apply 1 application topically 2 (two) times daily as needed.    . vitamin B-12 (CYANOCOBALAMIN) 500 MCG tablet Take 500 mcg by mouth 2 (two) times daily.     No current facility-administered medications for this encounter.    Physical Findings: In general this is a well appearing Caucasian male in no acute distress. He's alert and oriented x4 and appropriate throughout the examination. Cardiopulmonary assessment is negative for acute distress and he exhibits normal effort.  Lab Findings: Lab Results  Component Value Date   WBC 7.4 11/18/2019   HGB 14.9 11/18/2019   HCT 44.8 11/18/2019   MCV 90.1 11/18/2019   PLT 131 (L) 11/18/2019    Radiographic Findings:  Patient underwent CT imaging in our clinic for post implant dosimetry. The CT will be reviewed by Dr. Tammi Klippel to confirm there is an adequate distribution of radioactive seeds throughout the prostate gland and ensure that there are no seeds in or near the rectum. We suspect the final radiation plan and dosimetry will show appropriate coverage of the prostate gland. He understands that we will call and inform him of any unexpected findings on further review of his  imaging and dosimetry.  Impression/Plan: 79 y.o. gentleman with Stage T2a adenocarcinoma of the prostate with Gleason Score of 3+4, and PSA of 11.9. The patient is recovering from the effects of radiation. His urinary symptoms should gradually improve over the next 4-6 months. We talked about this today. He is encouraged by his improvement already and is otherwise pleased with his outcome. We also talked about long-term follow-up for prostate cancer following seed implant. He understands that ongoing PSA determinations and digital rectal exams will help perform surveillance to rule out disease recurrence. He has a follow up appointment scheduled with Dr. Claudia Desanctis at 9:15am on 12/05/19. He understands what to expect with his PSA measures. Patient was also educated today about some of the long-term effects from radiation including a small risk for rectal bleeding and possibly erectile dysfunction. We talked about some of the general management approaches to these potential complications.  We also discussed a trial of Flomax to see if this will help better manage his LUTS.  He is in agreement so I have sent a prescription to his pharmacy and he will report his progress to Dr. Claudia Desanctis at the time of his upcoming follow-up visit.  However, I did encourage the patient to contact our office or return at any point if he has questions or concerns related to his previous radiation and prostate cancer.  Today, a comprehensive survivorship care plan and treatment summary was reviewed with the patient today detailing his prostate cancer diagnosis, treatment course, potential late/long-term effects of treatment, appropriate follow-up care with recommendations for the future, and patient education resources.  A copy of this summary, along with a letter will be sent to the patient's primary care provider via fax after today's visit.   2. Cancer screening:  Due to [name]'s history and his age, he should receive screening for  skin cancers, colon cancer, and lung cancer.  The information and recommendations are listed on the patient's comprehensive care plan/treatment summary and were reviewed in detail with the patient.     3. Health maintenance and wellness promotion: Mr. Ernst Breach was encouraged to consume 5-7 servings of fruits and vegetables per day. He was provided a copy of the "Nutrition Rainbow" handout, as well as the handout "Take Control of Your Health and [place]" from the [place].  He was also encouraged to engage in moderate to vigorous exercise for 30 minutes per day most days of the week. Information was provided regarding the [facility] fitness program, which is designed for cancer survivors to help them become more physically fit after cancer treatments. We discussed that a healthy BMI is 18.5-24.9 and that maintaining a healthy weight reduces risk of cancer recurrences.  He was instructed to limit his alcohol consumption and continue to abstain from  tobacco use.  Lastly, he was encouraged to use sunscreen and wear protective clothing when in the sun.     4. Support services/counseling: It is not uncommon for this period of the patient's cancer care trajectory to be one of many emotions and stressors.  Mr. Ernst Breach was encouraged to take advantage of our many support services programs, support groups, and/or counseling in coping with his new life as a cancer survivor after completing anti-cancer treatment.  He was offered support today through active listening and expressive supportive counseling.  He was given information regarding our available services and encouraged to contact me with any questions or for help enrolling in any of our support group/programs.       Nicholos Johns, [name]

## 2019-11-26 NOTE — Telephone Encounter (Signed)
Called patient to remind of post seed appts. for 11-27-19, spoke with patient and he is aware of these appts.

## 2019-11-27 ENCOUNTER — Encounter: Payer: Self-pay | Admitting: Urology

## 2019-11-27 ENCOUNTER — Ambulatory Visit
Admission: RE | Admit: 2019-11-27 | Discharge: 2019-11-27 | Disposition: A | Discharge status: Home / Self Care | Payer: Medicare Other | Source: Ambulatory Visit | Attending: Urology | Admitting: Urology

## 2019-11-27 ENCOUNTER — Other Ambulatory Visit: Payer: Self-pay

## 2019-11-27 ENCOUNTER — Other Ambulatory Visit: Payer: Self-pay | Admitting: Urology

## 2019-11-27 ENCOUNTER — Ambulatory Visit
Admission: RE | Admit: 2019-11-27 | Discharge: 2019-11-27 | Disposition: A | Discharge status: Home / Self Care | Payer: Medicare Other | Source: Ambulatory Visit | Attending: Radiation Oncology | Admitting: Radiation Oncology

## 2019-11-27 VITALS — BP 109/85 | HR 75 | Temp 97.8°F | Resp 20 | Ht 70.0 in | Wt 201.6 lb

## 2019-11-27 DIAGNOSIS — Z79899 Other long term (current) drug therapy: Secondary | ICD-10-CM | POA: Insufficient documentation

## 2019-11-27 DIAGNOSIS — C61 Malignant neoplasm of prostate: Secondary | ICD-10-CM | POA: Diagnosis present

## 2019-11-27 DIAGNOSIS — R3911 Hesitancy of micturition: Secondary | ICD-10-CM | POA: Diagnosis not present

## 2019-11-27 DIAGNOSIS — Z7982 Long term (current) use of aspirin: Secondary | ICD-10-CM | POA: Insufficient documentation

## 2019-11-27 DIAGNOSIS — R350 Frequency of micturition: Secondary | ICD-10-CM | POA: Diagnosis not present

## 2019-11-27 MED ORDER — TAMSULOSIN HCL 0.4 MG PO CAPS: 0.4000 mg | ORAL_CAPSULE | Freq: Every day | ORAL | 3 refills | Status: DC

## 2019-11-27 NOTE — Progress Notes (Signed)
Weight and vitals stable. Reports intermittent dysuria. Preseed IPSS 1 with nocturia. Post seed IPSS 24. Denies hematuria. Denies urinary leakage or incontinence. Denies diarrhea. Reports he hasn't seen his urologist and does not have an appointment currently to see him.    BP 109/85   Pulse 75   Temp 97.8 F (36.6 C)   Resp 20   Ht 5\' 10"  (1.778 m)   Wt 201 lb 9.6 oz (91.4 kg)   SpO2 98%   BMI 28.93 kg/m  Wt Readings from Last 3 Encounters:  11/27/19 201 lb 9.6 oz (91.4 kg)  11/13/19 197 lb 6.4 oz (89.5 kg)  11/11/19 200 lb (90.7 kg)

## 2019-12-04 ENCOUNTER — Ambulatory Visit: Payer: No Typology Code available for payment source | Admitting: Physician Assistant

## 2019-12-04 ENCOUNTER — Inpatient Hospital Stay: Payer: Medicare Other | Attending: Hematology

## 2019-12-04 ENCOUNTER — Other Ambulatory Visit: Payer: Self-pay

## 2019-12-04 DIAGNOSIS — Z7189 Other specified counseling: Secondary | ICD-10-CM

## 2019-12-04 DIAGNOSIS — Z8616 Personal history of COVID-19: Secondary | ICD-10-CM | POA: Insufficient documentation

## 2019-12-04 DIAGNOSIS — C61 Malignant neoplasm of prostate: Secondary | ICD-10-CM | POA: Insufficient documentation

## 2019-12-04 DIAGNOSIS — E8581 Light chain (AL) amyloidosis: Secondary | ICD-10-CM | POA: Diagnosis present

## 2019-12-04 LAB — CBC WITH DIFFERENTIAL/PLATELET
Abs Immature Granulocytes: 0.01 10*3/uL (ref 0.00–0.07)
Basophils Absolute: 0 10*3/uL (ref 0.0–0.1)
Basophils Relative: 1 %
Eosinophils Absolute: 0.3 10*3/uL (ref 0.0–0.5)
Eosinophils Relative: 6 %
HCT: 44.6 % (ref 39.0–52.0)
Hemoglobin: 14.8 g/dL (ref 13.0–17.0)
Immature Granulocytes: 0 %
Lymphocytes Relative: 24 %
Lymphs Abs: 1.3 10*3/uL (ref 0.7–4.0)
MCH: 29.6 pg (ref 26.0–34.0)
MCHC: 33.2 g/dL (ref 30.0–36.0)
MCV: 89.2 fL (ref 80.0–100.0)
Monocytes Absolute: 0.4 10*3/uL (ref 0.1–1.0)
Monocytes Relative: 7 %
Neutro Abs: 3.4 10*3/uL (ref 1.7–7.7)
Neutrophils Relative %: 62 %
Platelets: 153 10*3/uL (ref 150–400)
RBC: 5 MIL/uL (ref 4.22–5.81)
RDW: 14.3 % (ref 11.5–15.5)
WBC: 5.5 10*3/uL (ref 4.0–10.5)
nRBC: 0 % (ref 0.0–0.2)

## 2019-12-04 LAB — CMP (CANCER CENTER ONLY)
ALT: 16 U/L (ref 0–44)
AST: 21 U/L (ref 15–41)
Albumin: 3.5 g/dL (ref 3.5–5.0)
Alkaline Phosphatase: 81 U/L (ref 38–126)
Anion gap: 9 (ref 5–15)
BUN: 21 mg/dL (ref 8–23)
CO2: 25 mmol/L (ref 22–32)
Calcium: 9.5 mg/dL (ref 8.9–10.3)
Chloride: 108 mmol/L (ref 98–111)
Creatinine: 1.37 mg/dL — ABNORMAL HIGH (ref 0.61–1.24)
GFR, Est AFR Am: 58 mL/min — ABNORMAL LOW (ref >60)
GFR, Estimated: 50 mL/min — ABNORMAL LOW (ref >60)
Glucose, Bld: 112 mg/dL — ABNORMAL HIGH (ref 70–99)
Potassium: 4 mmol/L (ref 3.5–5.1)
Sodium: 142 mmol/L (ref 135–145)
Total Bilirubin: 1.5 mg/dL — ABNORMAL HIGH (ref 0.3–1.2)
Total Protein: 6.9 g/dL (ref 6.5–8.1)

## 2019-12-04 LAB — VITAMIN D 25 HYDROXY (VIT D DEFICIENCY, FRACTURES): Vit D, 25-Hydroxy: 27.93 ng/mL — ABNORMAL LOW (ref 30–100)

## 2019-12-04 LAB — VITAMIN B12: Vitamin B-12: 1838 pg/mL — ABNORMAL HIGH (ref 180–914)

## 2019-12-05 LAB — KAPPA/LAMBDA LIGHT CHAINS
Kappa free light chain: 24 mg/L — ABNORMAL HIGH (ref 3.3–19.4)
Kappa, lambda light chain ratio: 1.52 (ref 0.26–1.65)
Lambda free light chains: 15.8 mg/L (ref 5.7–26.3)

## 2019-12-06 LAB — COPPER, SERUM: Copper: 107 ug/dL (ref 69–132)

## 2019-12-08 NOTE — Progress Notes (Signed)
  Radiation Oncology         [phone]) [phone] ________________________________  Name: Alex [name]. MRN: [id number]  Date: 11/27/2019  DOB: 04/21/1941  COMPLEX SIMULATION NOTE  NARRATIVE:  The patient was brought to the [facility] today following prostate seed implantation approximately one month ago.  Identity was confirmed.  All relevant records and images related to the planned course of therapy were reviewed.  Then, the patient was set-up supine.  CT images were obtained.  The CT images were loaded into the planning software.  Then the prostate and rectum were contoured.  Treatment planning then occurred.  The implanted iodine 125 seeds were identified by the physics staff for projection of radiation distribution  I have requested : 3D Simulation  I have requested a DVH of the following structures: Prostate and rectum.    ________________________________  Sheral Apley Tammi Klippel, M.D.

## 2019-12-09 LAB — MULTIPLE MYELOMA PANEL, SERUM
Albumin SerPl Elph-Mcnc: 3.4 g/dL (ref 2.9–4.4)
Albumin/Glob SerPl: 1.2 (ref 0.7–1.7)
Alpha 1: 0.2 g/dL (ref 0.0–0.4)
Alpha2 Glob SerPl Elph-Mcnc: 0.9 g/dL (ref 0.4–1.0)
B-Globulin SerPl Elph-Mcnc: 1 g/dL (ref 0.7–1.3)
Gamma Glob SerPl Elph-Mcnc: 0.8 g/dL (ref 0.4–1.8)
Globulin, Total: 2.9 g/dL (ref 2.2–3.9)
IgA: 112 mg/dL (ref 61–437)
IgG (Immunoglobin G), Serum: 828 mg/dL (ref 603–1613)
IgM (Immunoglobulin M), Srm: 52 mg/dL (ref 15–143)
Total Protein ELP: 6.3 g/dL (ref 6.0–8.5)

## 2019-12-11 ENCOUNTER — Inpatient Hospital Stay (HOSPITAL_BASED_OUTPATIENT_CLINIC_OR_DEPARTMENT_OTHER): Payer: Medicare Other | Admitting: Hematology

## 2019-12-11 ENCOUNTER — Other Ambulatory Visit: Payer: Self-pay

## 2019-12-11 VITALS — BP 128/76 | HR 82 | Temp 96.4°F | Resp 18 | Ht 70.0 in | Wt 200.5 lb

## 2019-12-11 DIAGNOSIS — E8809 Other disorders of plasma-protein metabolism, not elsewhere classified: Secondary | ICD-10-CM | POA: Diagnosis not present

## 2019-12-11 DIAGNOSIS — C61 Malignant neoplasm of prostate: Secondary | ICD-10-CM | POA: Diagnosis not present

## 2019-12-11 DIAGNOSIS — E8581 Light chain (AL) amyloidosis: Secondary | ICD-10-CM

## 2019-12-11 NOTE — Progress Notes (Signed)
HEMATOLOGY/ONCOLOGY CLINIC NOTE  Date of Service: 12/11/19   Patient Care Team: [name], MD as PCP - General (Internal Medicine) Burnell Blanks, MD as PCP - Cardiology (Cardiology) Brunetta Genera, MD as Consulting Physician (Hematology) Cira Rue, RN Nurse Navigator as Registered Nurse (Medical Oncology) Nephrology - Dr Madelon Lips MD Dr Henrene Pastor MD - GI  CHIEF COMPLAINTS/PURPOSE OF CONSULTATION:  AL Amyloidosis -with Nephrotic syndrome . No overt CHF pEF  HISTORY OF PRESENTING ILLNESS:   Alex [name]. is a wonderful 79 y.o. male who has been referred to [place] by Dr .[name], MD /Dr Madelon Lips MD for evaluation and management of newly diagnosed Kidney AL Amyloidosis with nephrotic syndrome.  Patient has a history of hypertension, dyslipidemia, coronary artery disease, seizure disorder on Dilantin who was apparently in his usual state of health until 3 months ago when he started developing new onset lower extremity edema. Patient had a UA at the time that showed 4+ protein in 24-hour collection revealed 15 g of protein. Additional workup showed a creatinine of 0.8 with an albumin of 2.3 and negative SPEP and UPEP. Apparently had an elevated K/L SFLC ratio of 5.19.  He was urgently referred by his primary care physician to nephrology for additional evaluation. Patient notes no bleeding issues. No nosebleeds. No periorbital bleeding. No abnormal skin rashes. Has had significant NSAID exposure and use to take a fair amount of naproxen for chronic low back pain but has cut down on this.  Due to lack of clear etiology for his nephrotic syndrome the patient underwent a kidney biopsy on 08/06/2015 accession [id number] which showed AL amyloidosis, lambda immunophenotype, [place] red positive. Noted to have severe arteriosclerosis with moderate tubular interstitial scarring.  Patient was referred to [place] for further evaluation and treatment of his AL  Amyloidosis, Patient notes that his leg swelling has improved with diuretic therapy.  He notes no weight loss. No night sweats. No focal bone pains. No skin rashes. Lungs normal bleeding or bruising.  Patient notes that he had a lot of reading online and he and his wife had an extensive list of questions which were answered in detail.  He notes he has had some chronic issues with upper abdominal pain and nausea and that he follows with Dr. Henrene Pastor and is apparently being scheduled for an EGD and colonoscopy according to his report.  Denies having and lacks tongue. No chest pain and no overt new shortness of breath. Patient is uncertain if an echocardiogram has been ordered or scheduled.  INTERVAL HISTORY  Alex [name] is here for a scheduled follow-up for his AL amyloidosis. We are joined today by his wife, Mrs. Drouillard. The patient's last visit with [place] was on 09/09/2019. The pt reports that he is doing well overall.  The pt reports that he is only using oxygen at night and is planning on completing a sleep study soon. Pt had a seed implant placed 3-4 weeks ago for treatment of his Prostate cancer. The severity of his neuropathy varies quite a bit, but is improved overall. Pt has received the COVID19 vaccine and is not interested getting the booster.   Lab results (12/04/19) of CBC w/diff and CMP is as follows: all values are WNL except for Glucose at 112, Creatinine at 1.37, Total Bilirubin at 1.5, GFR Est Non Af Am at 50. 12/04/2019 Copper 107 12/04/2019 Vitamin B12 at 1838 12/04/2019 Vitamin D 25 (OH) at 27.93 12/04/2019 MMP shows all values are  WNL 12/04/2019 K/L light chains is as follows: Kappa free light chain at 24.0, Lamda free light chains at 15.8, K/L light chain ratio at 1.52 11/20/2019 24-hr UPEP is as follows: all values are WNL except for Total Protein at 192  On review of systems, pt reports numbness/tingling in fingers and denies abdominal pain, SOB, chest pain and any other  symptoms.   MEDICAL HISTORY:  Past Medical History:  Diagnosis Date  . AL amyloidosis [facility]) oncologist-- dr [name]   Renal and Systemic , dx 04/ 2018;  renal bx 08-05-2016 and bone marrow bx 08-29-2016;  8 month chemotherapy then stell cell transplant 12/ 2018 then chemo again;  since then been on oral chemo daily (11-07-2019 per pt has been on hold until after prostate seed implants)  . Arthritis   . CAD (coronary artery disease) cardiologist-- dr [name];  also followed by cardiologist w/ [facility] in [place], dr [name]   moderate nonobstrucive disease by cath 2011; Lexiscan Myoview 6/14--Normal study, no scar or ischemia, EF 62%;  11-14-2014 nuclear study low risk w/ normal perfusion, nuclear ef 67%  . CKD (chronic kidney disease), stage III   . Diverticulosis of colon   . GERD (gastroesophageal reflux disease)   . H/O stem cell transplant ([place]) 03/2017  . Hiatal hernia   . History of 2019 novel coronavirus disease (COVID-19) 09/2018   [facility], hospital admission 10-28-2019 respiratory failure/ pneumometrostinom/ PE ;  was not intubated , treatment with remdesivir/ steroids/ actemra/ plasma  . History of basal cell carcinoma (BCC)   . History of colon polyps   . History of MRSA infection    culture positive  . History of pulmonary embolus (PE) 10/2018   with covid 19, completed treatment with eliquis  . History of seizures    11-07-2019  per pt last one 1970s, unknown cause  . History of vertebral compression fracture 1988   lumbar  . Hyperlipidemia   . Hypertension   . Neuropathy due to chemotherapeutic drug (HCC)    fingers tips and feet  . Nocturia   . On supplemental oxygen therapy    post covid 19;   11-07-2019 pt uses O2 every night 2L via [place],  and uses supplements during the day when walking around neighborhood and goes out to appointments/ store that he has to walk a distance  . OSA (obstructive sleep apnea)    study in epic 03-18-2005 mild to  moderate   (11-07-2019  per pt used cpap up until when he lost alot of weight, stoped approx. 2012  . Prostate cancer [facility]) urologist--- dr pace/  oncology-- dr Tammi Klippel   dx 03/ 2021  . Pulmonary fibrosis ([place]) 09/2018   pulmonologist-- dr Vaughan Browner;   post covid 19  . SOB (shortness of breath) on exertion    11-07-2019  per pt walks around neighborhood for 30 minutes twice daily, he does have to stop frequently d/t sob and since weather has been hot he uses his supplemental oxygen;  stated he has a pulse ox. at home , when he is sitting down O2 sat 96 -97% on RA when up walking around the house O2 sat 93-94% on RA;  pt stated he carry's his O2 w/ him when he goes to doctor appts/store d/t distance  . Wears glasses   . Wears hearing aid in both ears   Obstructive sleep apnea Gastroesophageal reflux disease Aortic stenosis Erectile dysfunction Peripheral neuropathy Seizure disorder   SURGICAL  HISTORY: Past Surgical History:  Procedure Laterality Date  . CARDIAC CATHETERIZATION  05-21-1999  and 12-29-2000  '@[facility]'   dr Maryjo [place] nuclear study;  moderate nonobstructive disease involving LAD and LCx;  second done due to in Reversal research trial  . CARDIAC CATHETERIZATION  07/07/2009  dr [name]   nonobstructive disease w/ normal LVF  . CARPAL TUNNEL RELEASE Left 1994  . CERVICAL FUSION  1988   C2 -- 4  . CYSTOSCOPY N/A 11/13/2019   Procedure: CYSTOSCOPY FLEXIBLE;  Surgeon: [name], MD;  Location: [facility];  Service: Urology;  Laterality: N/A;  no seeds detected per Dr. Claudia Desanctis  . FOOT SURGERY Bilateral x3 last one 1980s   bunionectomy great toe--- x2 right and x1 left  . KNEE ARTHROSCOPY Left 1996  . NASAL SEPTUM SURGERY  x3 last one 08-09-2010 @ [facility]  . PAROTIDECTOMY Right 03/09/2017  . RADIOACTIVE SEED IMPLANT N/A 11/13/2019   Procedure: RADIOACTIVE SEED IMPLANT/BRACHYTHERAPY IMPLANT;  Surgeon: [name], MD;  Location: [facility];   Service: Urology;  Laterality: N/A;  ONLY NEEDS 90 MIN FOR ALL PROCEDURES  . ROTATOR CUFF REPAIR Bilateral x1 rigth (unsure date);  x2 left last one 09-07-2009 '@[facility]' ;  right  . SPACE OAR INSTILLATION N/A 11/13/2019   Procedure: SPACE OAR INSTILLATION;  Surgeon: [name], MD;  Location: [facility];  Service: Urology;  Laterality: N/A;  . TRIGGER FINGER RELEASE Bilateral x2  last one 2001 approx.   x1 finger left hand;  x2 fingers right hand  . UMBILICAL HERNIA REPAIR  01/19/2011  . VASECTOMY  approx. 90 (with anesthesia)    SOCIAL HISTORY: Social History   Socioeconomic History  . Marital status: Married    Spouse name: Not on file  . Number of children: 2  . Years of education: 78  . Highest education level: Not on file  Occupational History  . Occupation: Retired  Tobacco Use  . Smoking status: Former Smoker    Packs/day: 3.00    Years: 20.00    Pack years: 60.00    Types: Cigarettes    Quit date: 04/25/1969    Years since quitting: 50.6  . Smokeless tobacco: Former Systems developer    Types: [place] date: 11/07/1983  Vaping Use  . Vaping Use: Never used  Substance and Sexual Activity  . Alcohol use: Yes    Comment: occasional  . Drug use: Never  . Sexual activity: Not Currently    Comment: SHIM 1. Reports erectile dysfunction.;    Other Topics Concern  . Not on file  Social History Narrative   ** Merged History Encounter **   Lives at home w/ his wife   Right-handed   Caffeine: occasional Pepsi       Social Determinants of Health   Financial Resource Strain:   . Difficulty of Paying Living Expenses:   Food Insecurity:   . Worried About Charity fundraiser in the Last Year:   . Arboriculturist in the Last Year:   Transportation Needs:   . Film/video editor (Medical):   [name] Kitchen Lack of Transportation (Non-Medical):   Physical Activity:   . Days of Exercise per Week:   . Minutes of Exercise per Session:   Stress:   . Feeling of Stress :    Social Connections:   . Frequency of Communication with Friends and Family:   . Frequency of Social Gatherings with Friends  and Family:   . Attends Religious Services:   . Active Member of Clubs or Organizations:   . Attends Archivist Meetings:   [name] Kitchen Marital Status:   Intimate Partner Violence:   . Fear of Current or Ex-Partner:   . Emotionally Abused:   [name] Kitchen Physically Abused:   . Sexually Abused:   Patient is currently retired.   FAMILY HISTORY: Family History  Problem Relation Age of Onset  . Diabetes Father   . Heart disease Father   . Heart attack Father 44  . Hypertension Father   . Hearing loss Mother   . Eczema Sister   . Cancer Sister        1 sister had cancer but patient unsure of type  . Hyperlipidemia Other   . Diabetes Other   . Hypertension Other   . Seizures Other   . Thyroid disease Other   . Colon cancer Neg Hx   . Stroke Neg Hx   . Breast cancer Neg Hx   . Pancreatic cancer Neg Hx   . Prostate cancer Neg Hx     ALLERGIES:  is allergic to ramipril.  MEDICATIONS:  Current Outpatient Medications  Medication Sig Dispense Refill  . Ascorbic Acid (VITAMIN C) 1000 MG tablet Take 1,000 mg by mouth 2 (two) times a day.     [name] Kitchen aspirin EC 81 MG tablet Take 81 mg by mouth daily.    [name] Kitchen atorvastatin (LIPITOR) 40 MG tablet Take 40 mg by mouth at bedtime.     [name] Kitchen b complex vitamins capsule Take 1 capsule by mouth daily.    . Cholecalciferol (D3-1000) 25 MCG (1000 UT) tablet Take 1,000 Units by mouth 2 (two) times a day.     . ezetimibe (ZETIA) 10 MG tablet Take 10 mg by mouth daily.     . fluticasone (FLONASE) 50 MCG/ACT nasal spray Place 2 sprays into both nostrils daily. 16 g 5  . folic acid (FOLVITE) 1 MG tablet Take 1 mg by mouth at bedtime.     . furosemide (LASIX) 40 MG tablet Take 40 mg by mouth daily.     . isosorbide mononitrate (IMDUR) 15 mg TB24 24 hr tablet Take 15 mg by mouth daily.    . Multiple Vitamin (MULTI-VITAMINS) TABS Take 1 tablet by  mouth daily.    . nitrofurantoin, macrocrystal-monohydrate, (MACROBID) 100 MG capsule Take 100 mg by mouth 2 (two) times daily.    . nitroGLYCERIN (NITROSTAT) 0.4 MG SL tablet Place 1 tablet (0.4 mg total) under the tongue every 5 (five) minutes as needed for chest pain. 25 tablet 3  . pantoprazole (PROTONIX) 40 MG tablet Take 40 mg by mouth daily.     . potassium chloride SA (KLOR-CON) 20 MEQ tablet Take 20 mEq by mouth daily.    . tamsulosin (FLOMAX) 0.4 MG CAPS capsule Take 1 capsule (0.4 mg total) by mouth daily after supper. 30 capsule 3  . triamcinolone cream (KENALOG) 0.1 % Apply 1 application topically 2 (two) times daily as needed.    . vitamin B-12 (CYANOCOBALAMIN) 500 MCG tablet Take 500 mcg by mouth 2 (two) times daily.     No current facility-administered medications for this visit.    REVIEW OF SYSTEMS:   A 10+ POINT REVIEW OF SYSTEMS WAS OBTAINED including neurology, dermatology, psychiatry, cardiac, respiratory, lymph, extremities, GI, GU, Musculoskeletal, constitutional, breasts, reproductive, HEENT.  All pertinent positives are noted in the HPI.  All others are negative.   PHYSICAL EXAMINATION:  ECOG PERFORMANCE STATUS: 1 - Symptomatic but completely ambulatory  Vitals:   12/11/19 1415  BP: 128/76  Pulse: 82  Resp: 18  Temp: (!) 96.4 F (35.8 C)  SpO2: 95%   Filed Weights   12/11/19 1415  Weight: 200 lb 8 oz (90.9 kg)   .Body mass index is 28.77 kg/m.   GENERAL:alert, in no acute distress and comfortable SKIN: no acute rashes, no significant lesions EYES: conjunctiva are pink and non-injected, sclera anicteric OROPHARYNX: MMM, no exudates, no oropharyngeal erythema or ulceration NECK: supple, no JVD LYMPH:  no palpable lymphadenopathy in the cervical, axillary or inguinal regions LUNGS: clear to auscultation b/l with normal respiratory effort HEART: regular rate & rhythm ABDOMEN:  normoactive bowel sounds , non tender, not distended. No palpable  hepatosplenomegaly.  Extremity: no pedal edema PSYCH: alert & oriented x 3 with fluent speech NEURO: no focal motor/sensory deficits  LABORATORY DATA:  I have reviewed the data as listed  . CBC Latest Ref Rng & Units 12/04/2019 11/18/2019 11/11/2019  WBC 4.0 - 10.5 K/uL 5.5 7.4 6.3  Hemoglobin 13.0 - 17.0 g/dL 14.8 14.9 14.5  Hematocrit 39 - 52 % 44.6 44.8 44.3  Platelets 150 - 400 K/uL 153 131(L) 153    CMP Latest Ref Rng & Units 12/04/2019 11/18/2019 11/11/2019  Glucose 70 - 99 mg/dL 112(H) 102(H) 104(H)  BUN 8 - 23 mg/dL '21 22 19  ' Creatinine 0.61 - 1.24 mg/dL 1.37(H) 1.43(H) 1.40(H)  Sodium 135 - 145 mmol/L 142 141 140  Potassium 3.5 - 5.1 mmol/L 4.0 4.0 4.4  Chloride 98 - 111 mmol/L 108 106 105  CO2 22 - 32 mmol/L '25 26 26  ' Calcium 8.9 - 10.3 mg/dL 9.5 9.6 8.9  Total Protein 6.5 - 8.1 g/dL 6.9 7.0 6.8  Total Bilirubin 0.3 - 1.2 mg/dL 1.5(H) 1.5(H) 1.4(H)  Alkaline Phos 38 - 126 U/L 81 73 60  AST 15 - 41 U/L '21 26 31  ' ALT 0 - 44 U/L '16 25 22        ' 07/09/2019 Prostate Patholgy Report ([id number]):    RADIOGRAPHIC STUDIES: I have personally reviewed the radiological images as listed and agreed with the findings in the report. DG C-Arm 1-60 Min-No Report  Result Date: 11/13/2019 Fluoroscopy was utilized by the requesting physician.  No radiographic interpretation.   Result status: Edited Result - FINAL                              *[facility] [employer].                        [place], [place] [address]                            [phone]  ------------------------------------------------------------------- Transthoracic Echocardiography  (Report amended )  Patient:    Cora, Stetson Alex #:       [id number] Study Date: 08/24/2016 Gender:     M Age:        58 Height:     180.3 cm Weight:     90.6 kg BSA:  2.15 m^2 Pt. Status: Room:   ATTENDING    Darlina Guys,  MD  Willia Craze, Christopher  REFERRING    [place], [place], Outpatient  SONOGRAPHER  Roseanna Rainbow  cc:  ------------------------------------------------------------------- LV EF: 65% -   70%  ------------------------------------------------------------------- Indications:      Aortic stenosis 424.1.  ------------------------------------------------------------------- History:   PMH:  Amyloidosis.  Coronary artery disease.  Angina pectoris.  Risk factors:  Hypertension. Dyslipidemia.  ------------------------------------------------------------------- Study Conclusions  - Left ventricle: The cavity size was normal. Wall thickness was   increased in a pattern of mild LVH. Systolic function was   vigorous. The estimated ejection fraction was in the range of 65%   to 70%. Wall motion was normal; there were no regional wall   motion abnormalities. Doppler parameters are consistent with   abnormal left ventricular relaxation (grade 1 diastolic   dysfunction). - Aortic valve: Valve mobility was mildly restricted. - Aortic root: The aortic root was mildly dilated. - Mitral valve: Calcified annulus.  Impressions:  - Vigorous LV systolic function; mild diastolic dysfunction; mild   LVH; calcified aortic valve with no significant AS (peak velocity   2 m/s; mean gradient 9 mmHg); mildly dilated aortic root.    ASSESSMENT & PLAN:   79 y.o.  male with above-mentioned multiple medical comorbidities with  #1 Biopsy proven Renal and Systemic AL Amyloidosis Initial BM Bx shows 9% plasma cells and amyloid deposits. Echo shows normal systolic function with only grade 1 diastolic dysfunction. Skeletal survey with no lytic lesions suggestive of multiple myeloma. Serum K/L ratio increased at about 6.22 on diagnosis and has now improved to 1.5 (wnl with normal kappa lambda serum free light chain ratio ) UPEP shows 24h protein improved from 15g to 5g  daily 05/29/18 24hour UPEP no M spike   01/12/18 ECHO revealed LV EF of 60-65%   #2  Nephrotic Syndrome - likely related to AL amyloidosis.  Urine Study 07/26/17 showed Protein at 2.232g.  -07/19/17 the pt had a BM Bx exam which revealed Normocellular bone marrow (30-40%) with 2-3% plasma cells and focal amyloid deposition. UPEP 05/29/2018 - showed no M spike and total urinary protein of 155m/24h -- resolution of nephrotic syndrome.  #3 Prostatic Adenocarcinoma - Gleason Score 3+4=7  PLAN: -Discussed pt labwork, 12/04/19; blood counts are nml, blood chemistries are stable, no M Protein observed, K/L light chains are stable, B12 levels are adequate, Copper is nml, Vitamin D is low -Discussed 11/20/2019 24-hr UPEP looks good  -Advised pt that he is currently hematologic and nephrologic remission from AL amyloidosis.  -Advised pt that we have to consider regeneration and repair of the lung, blood counts, and overall functional status when deciding when to restart [place]. Will not restart at this time. Will reconsider in 3-4 months.  -Recommend pt continue to increase lung function with reasonable cardiovascular exercise. -Gave pt CDC guidelines on COVID19 vaccine boosters for his consideration.  -Advised pt that he can cut down his dose of Vitamin B12. Can continue 500 mcg per day or 1000 mcg three times per week. -Goal Vitamin D is 60-90. Will have pt begin 50K IU Vitamin D weekly.  -Continue f/u with Dr. MVaughan Brownerfor pulmonary function monitoring  -Contine f/u with Dr. MTammi Klippelfor Prostate cancer treatment -Rx Ergocalciferol  -Will see back in 4 months, with labs 1 week prior  #4 Patient Active Problem List   Diagnosis Date Noted  . Malignant neoplasm of  prostate ([place]) 08/06/2019  . Pneumonia due to COVID-19 virus 10/29/2018  . Acute respiratory failure with hypoxia ([place]) 10/29/2018  . Thrombocytopenia ([place]) 10/29/2018  . Left lower lobe pneumonia 04/22/2018  . Sepsis due to pneumonia ([place])  04/22/2018  . Engraftment syndrome following stem cell transplantation ([place]) 04/27/2017  . H/O autologous stem cell transplant ([place]) 04/10/2017  . Aortic stenosis 03/08/2017  . Hearing loss 03/08/2017  . OSA (obstructive sleep apnea) 03/08/2017  . Parotid mass 02/23/2017  . Dysfunction of right eustachian tube 02/06/2017  . Presbycusis of both ears 02/06/2017  . Folliculitis 53/04/402  . AL amyloidosis ([place]) 08/21/2016  . Umbilical hernia 59/13/6859  . DIZZINESS 02/19/2010  . CAD, NATIVE VESSEL 07/21/2009  . CHEST PAIN-UNSPECIFIED 07/21/2009  . HYPERLIPIDEMIA 04/30/2007  . Essential hypertension 04/30/2007  . GASTROESOPHAGEAL REFLUX DISEASE 04/30/2007  . SEIZURE DISORDER 04/30/2007  . COMPRESSION FRACTURE, L1 VERTEBRA 04/30/2007  . DIVERTICULOSIS, COLON, HX OF 04/30/2007  . Other postprocedural status(V45.89) 04/30/2007  -Continue follow-up with primary care physician for management of other medical co-morbidities. -Recommend if pt experiences spike fever to report to hospital with transplant team    FOLLOW UP: RTC with Dr [name] in 4 months Plz schedule labs 1 week prior to visit   The total time spent in the appt was 30 minutes and more than 50% was on counseling and direct patient cares.  All of the patient's questions were answered with apparent satisfaction. The patient knows to call the clinic with any problems, questions or concerns.   Sullivan Lone MD [place] AAHIVMS [facility] Hematology/Oncology Physician [facility]  (Office):       [phone] (Work cell):  [phone] (Fax):           [phone]  I, Yevette Edwards, am acting as a scribe for Dr. Sullivan Lone.   .I have reviewed the above documentation for accuracy and completeness, and I agree with the above. Brunetta Genera MD

## 2019-12-12 ENCOUNTER — Telehealth: Payer: Self-pay | Admitting: Hematology

## 2019-12-12 NOTE — Telephone Encounter (Signed)
Scheduled appointments per 8/18 los. Patient is aware of appointments dates and times.

## 2019-12-16 ENCOUNTER — Telehealth: Payer: Self-pay

## 2019-12-16 NOTE — Telephone Encounter (Signed)
TC from Pt stating that he was suppose to get a prescription from Dr. [name] but could not remember what prescription it was informed Pt that he was to get a prescription for vitamin D and Dr [name] will send it to his pharmacy. Pt. requests prescription be sent to [facility].

## 2019-12-17 ENCOUNTER — Other Ambulatory Visit: Payer: Self-pay | Admitting: Hematology

## 2019-12-17 MED ORDER — ERGOCALCIFEROL 1.25 MG (50000 UT) PO CAPS: 50000.0000 [IU] | ORAL_CAPSULE | ORAL | 2 refills | Status: DC

## 2019-12-17 NOTE — Progress Notes (Unsigned)
Ergocalciferol

## 2019-12-24 ENCOUNTER — Telehealth: Payer: Self-pay | Admitting: Pulmonary Disease

## 2019-12-24 NOTE — Telephone Encounter (Signed)
Spoke with patient regarding his message. Patient was wondering if he still needs to use his O2 at night or can he D/C.Dr.Mannam can you please advise.

## 2019-12-24 NOTE — Telephone Encounter (Signed)
I was able to connect with the patient and reviewed CT and PFTs with him.  Nothing further needed.

## 2019-12-24 NOTE — Telephone Encounter (Signed)
[facility] is arranging overnight oximetry on room air We will review this and then decide if he can come off supplemental oxygen at night. I called the patient and already informed him about this.  Nothing further needed.

## 2020-01-03 ENCOUNTER — Ambulatory Visit
Admission: RE | Admit: 2020-01-03 | Discharge: 2020-01-03 | Disposition: A | Discharge status: Home / Self Care | Payer: Medicare Other | Source: Ambulatory Visit | Attending: Radiation Oncology | Admitting: Radiation Oncology

## 2020-01-03 ENCOUNTER — Encounter: Payer: Self-pay | Admitting: Radiation Oncology

## 2020-01-03 DIAGNOSIS — C61 Malignant neoplasm of prostate: Secondary | ICD-10-CM | POA: Insufficient documentation

## 2020-01-31 ENCOUNTER — Other Ambulatory Visit (HOSPITAL_COMMUNITY): Payer: Self-pay | Admitting: Radiology

## 2020-01-31 DIAGNOSIS — J9611 Chronic respiratory failure with hypoxia: Secondary | ICD-10-CM

## 2020-02-04 NOTE — Progress Notes (Signed)
°  Radiation Oncology         [phone]) [phone] ________________________________  Name: Alex [name]. MRN: [id number]  Date: 01/03/2020  DOB: 1940/08/29  3D Planning Note   Prostate Brachytherapy Post-Implant Dosimetry  Diagnosis: 79 y.o. gentleman with Stage T2a adenocarcinoma of the prostate with Gleason score of 3+4, and PSA of 11.9  Narrative: On a previous date, Alex [name]. returned following prostate seed implantation for post implant planning. He underwent CT scan complex simulation to delineate the three-dimensional structures of the pelvis and demonstrate the radiation distribution.  Since that time, the seed localization, and complex isodose planning with dose volume histograms have now been completed.  Results:   Prostate Coverage - The dose of radiation delivered to the 90% or more of the prostate gland (D90) was 107.37% of the prescription dose. This exceeds our goal of greater than 90%. Rectal Sparing - The volume of rectal tissue receiving the prescription dose or higher was 0.0 cc. This falls under our thresholds tolerance of 1.0 cc.  Impression: The prostate seed implant appears to show adequate target coverage and appropriate rectal sparing.  Plan:  The patient will continue to follow with urology for ongoing PSA determinations. I would anticipate a high likelihood for local tumor control with minimal risk for rectal morbidity.  ________________________________  Sheral Apley Tammi Klippel, M.D.

## 2020-02-07 ENCOUNTER — Other Ambulatory Visit (HOSPITAL_COMMUNITY)
Admission: RE | Admit: 2020-02-07 | Discharge: 2020-02-07 | Disposition: A | Discharge status: Home / Self Care | Payer: Medicare Other | Source: Ambulatory Visit | Attending: Cardiovascular Disease | Admitting: Cardiovascular Disease

## 2020-02-07 DIAGNOSIS — Z01812 Encounter for preprocedural laboratory examination: Secondary | ICD-10-CM | POA: Insufficient documentation

## 2020-02-07 DIAGNOSIS — Z20822 Contact with and (suspected) exposure to covid-19: Secondary | ICD-10-CM | POA: Insufficient documentation

## 2020-02-08 LAB — SARS CORONAVIRUS 2 (TAT 6-24 HRS): SARS Coronavirus 2: NEGATIVE

## 2020-02-10 ENCOUNTER — Other Ambulatory Visit: Payer: Self-pay

## 2020-02-10 ENCOUNTER — Ambulatory Visit (HOSPITAL_COMMUNITY)
Admission: RE | Admit: 2020-02-10 | Discharge: 2020-02-10 | Disposition: A | Discharge status: Home / Self Care | Payer: Medicare Other | Source: Ambulatory Visit | Attending: Internal Medicine | Admitting: Internal Medicine

## 2020-02-10 DIAGNOSIS — J9611 Chronic respiratory failure with hypoxia: Secondary | ICD-10-CM | POA: Insufficient documentation

## 2020-02-10 LAB — PULMONARY FUNCTION TEST
DL/VA % pred: 62 %
DL/VA: 2.45 ml/min/mmHg/L
DLCO unc % pred: 42 %
DLCO unc: 10.4 ml/min/mmHg
FEF 25-75 Post: 3.08 L/sec
FEF 25-75 Pre: 2.93 L/sec
FEF2575-%Change-Post: 5 %
FEF2575-%Pred-Post: 148 %
FEF2575-%Pred-Pre: 140 %
FEV1-%Change-Post: 0 %
FEV1-%Pred-Post: 89 %
FEV1-%Pred-Pre: 90 %
FEV1-Post: 2.62 L
FEV1-Pre: 2.64 L
FEV1FVC-%Change-Post: 0 %
FEV1FVC-%Pred-Pre: 116 %
FEV6-%Change-Post: -1 %
FEV6-%Pred-Post: 81 %
FEV6-%Pred-Pre: 82 %
FEV6-Post: 3.11 L
FEV6-Pre: 3.15 L
FEV6FVC-%Pred-Post: 106 %
FEV6FVC-%Pred-Pre: 106 %
FVC-%Change-Post: -1 %
FVC-%Pred-Post: 76 %
FVC-%Pred-Pre: 77 %
FVC-Post: 3.11 L
FVC-Pre: 3.15 L
Post FEV1/FVC ratio: 84 %
Post FEV6/FVC ratio: 100 %
Pre FEV1/FVC ratio: 84 %
Pre FEV6/FVC Ratio: 100 %
RV % pred: 41 %
RV: 1.07 L
TLC % pred: 63 %
TLC: 4.41 L

## 2020-02-10 MED ORDER — ALBUTEROL SULFATE (2.5 MG/3ML) 0.083% IN NEBU
2.5000 mg | INHALATION_SOLUTION | Freq: Once | RESPIRATORY_TRACT | Status: AC
  Administered 2020-02-10: 2.5 mg via RESPIRATORY_TRACT

## 2020-03-23 ENCOUNTER — Ambulatory Visit: Payer: No Typology Code available for payment source | Admitting: Pulmonary Disease

## 2020-03-23 ENCOUNTER — Ambulatory Visit: Payer: No Typology Code available for payment source

## 2020-03-25 DIAGNOSIS — C9001 Multiple myeloma in remission: Secondary | ICD-10-CM | POA: Insufficient documentation

## 2020-04-02 ENCOUNTER — Ambulatory Visit (INDEPENDENT_AMBULATORY_CARE_PROVIDER_SITE_OTHER): Payer: Medicare Other

## 2020-04-02 ENCOUNTER — Ambulatory Visit (INDEPENDENT_AMBULATORY_CARE_PROVIDER_SITE_OTHER): Payer: Medicare Other | Admitting: Pulmonary Disease

## 2020-04-02 ENCOUNTER — Other Ambulatory Visit: Payer: Self-pay

## 2020-04-02 ENCOUNTER — Encounter: Payer: Self-pay | Admitting: Pulmonary Disease

## 2020-04-02 ENCOUNTER — Ambulatory Visit (INDEPENDENT_AMBULATORY_CARE_PROVIDER_SITE_OTHER): Payer: No Typology Code available for payment source

## 2020-04-02 VITALS — BP 120/70 | HR 78 | Temp 97.2°F | Ht 71.0 in | Wt 201.0 lb

## 2020-04-02 DIAGNOSIS — R0600 Dyspnea, unspecified: Secondary | ICD-10-CM

## 2020-04-02 DIAGNOSIS — R0602 Shortness of breath: Secondary | ICD-10-CM | POA: Diagnosis not present

## 2020-04-02 DIAGNOSIS — U099 Post covid-19 condition, unspecified: Secondary | ICD-10-CM | POA: Diagnosis not present

## 2020-04-02 NOTE — Progress Notes (Signed)
Six Minute Walk - 04/02/20 1023      Six Minute Walk   Medications taken before test (dose and time) Vitamin C 1000mg , Aspirin 81mg , Lipitor 40mg , Vitamin D 1000 units, Vitamin B-12 58mcg, Flonase 7mcg/act, Imdur 15mg , Macrobid 100mg , Protonix 40mg , Klor-Con 15meq, and Flomax 0.4mg  at 8am    Supplemental oxygen during test? No    Lap distance in meters  34 meters    Laps Completed 9    Partial lap (in meters) 0 meters    Baseline BP (sitting) 120/70    Baseline Heartrate 78    Baseline Dyspnea (Borg Scale) 4    Baseline Fatigue (Borg Scale) 3    Baseline SPO2 98 %      End of Test Values    BP (sitting) 136/90    Heartrate 91    Dyspnea (Borg Scale) 6    Fatigue (Borg Scale) 5    SPO2 93 %      2 Minutes Post Walk Values   BP (sitting) 126/76    Heartrate 83    SPO2 97 %    Stopped or paused before six minutes? No    Other Symptoms at end of exercise: Angina;Hip pain   pt has had CP for months now, neither symptom are new symptoms     Interpretation   Distance completed 306 meters    Tech Comments: Pt walked at an average pace completing the entire 73min without stopping. Pt has had complaints of chest pain for months now and also has had complaints of hip pain which are not new symptoms.

## 2020-04-02 NOTE — Progress Notes (Addendum)
Alex [name] [place].    [id number]    01/01/1941  Primary Care Physician:Shaw, Gwyndolyn Saxon, MD  Referring Physician: [name], MD [address] [place],  [place] [address]  Chief complaint: Follow up for pulmonary fibrosis, post COVID-19 pneumonia  HPI: [name] is here for evaluation of dyspnea  He has past medical history of AL amyloidosis with nephrotic syndrome, hypertension, moderate nonobstructive CAD by cath 2011, aortic stenosis, GERD, hyperlipidemia, carotid artery disease.  Hospitalized from July 5 to July 25 at [facility] for COVID-19 pneumonia. He has received treatment with Remdesivir 7/6-7/10, steroids for 10 days up until 7/15, received Actemra on 7/11 and plasma on 7/10.  He also received hydroxychloroquine per his request after discussion of risks and benefits. Hospital course complicated with subsegmental pulmonary embolism and he is on anticoagulation,  pneumomediastinum that was treated conservatively.  Maintained on high flow nasal cannula.  Did not need to be intubated He has been discharged on supplemental oxygen.  Continues to have significant dyspnea on exertion at home.  He is working with physical therapy and trying to stay active at home.  Tried on Symbicort but did not help and hence this was stopped.  History notable for amyloidosis status post autologous stem cell transplant in 2018.  Recently saw Dr. Minda [name], hematologist and maintenance [place] for amyloidosis is on hold.  Pets: Has a dog and cat.  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  No asbestos, mold, hot tub, Jacuzzi, down pillows or comforters Smoking history: 30-45-pack-year smoker.  Quit in 1971 Travel history: Previously lived in [place].  No significant recent travel Relevant family history: No significant family history of lung disease  Interim history: Continues to have significant  dyspnea on exertion.  He is frustrated that he is not made much more progress  Off supplemental oxygen during the day and continues on oxygen at night He had a nonconclusive sleep study at [facility] and is scheduled for in lab study  Outpatient Encounter Medications as of 04/02/2020  Medication Sig  . Ascorbic Acid (VITAMIN C) 1000 MG tablet Take 1,000 mg by mouth 2 (two) times a day.   [name] Kitchen aspirin EC 81 MG tablet Take 81 mg by mouth daily.  [name] Kitchen atorvastatin (LIPITOR) 40 MG tablet Take 40 mg by mouth at bedtime.   [name] Kitchen b complex vitamins capsule Take 1 capsule by mouth daily.  . Cholecalciferol 25 MCG (1000 UT) tablet Take 1,000 Units by mouth 2 (two) times a day.   . ezetimibe (ZETIA) 10 MG tablet Take 10 mg by mouth daily.   . fluticasone (FLONASE) 50 MCG/ACT nasal spray Place 2 sprays into both nostrils daily.  . folic acid (FOLVITE) 1 MG tablet Take 1 mg by mouth at bedtime.   . furosemide (LASIX) 40 MG tablet Take 40 mg by mouth daily.   . isosorbide mononitrate (IMDUR) 15 mg TB24 24 hr tablet Take 15 mg by mouth daily.  . Multiple Vitamin (MULTI-VITAMINS) TABS Take 1 tablet by mouth daily.  . nitrofurantoin, macrocrystal-monohydrate, (MACROBID) 100 MG capsule Take 100 mg by mouth 2 (two) times daily.  . nitroGLYCERIN (NITROSTAT) 0.4 MG SL tablet Place 1 tablet (0.4 mg total) under the tongue every 5 (five) minutes as needed for chest pain.  . pantoprazole (PROTONIX) 40 MG tablet Take 40 mg by mouth daily.   . potassium chloride SA (KLOR-CON) 20 MEQ tablet  Take 20 mEq by mouth daily.  . tamsulosin (FLOMAX) 0.4 MG CAPS capsule Take 1 capsule (0.4 mg total) by mouth daily after supper.  . triamcinolone cream (KENALOG) 0.1 % Apply 1 application topically 2 (two) times daily as needed.  . vitamin B-12 (CYANOCOBALAMIN) 500 MCG tablet Take 500 mcg by mouth 2 (two) times daily.   No facility-administered encounter medications on file as of 04/02/2020.    Allergies as of 01/10/2019 - Review  Complete 01/10/2019  Allergen Reaction Noted  . Ramipril Cough    Physical Exam: Blood pressure 120/70, pulse 78, temperature (!) 97.2 F (36.2 C), temperature source Temporal, height 5\' 11"  (1.803 m), weight 201 lb (91.2 kg), SpO2 96 %. Gen:      No acute distress HEENT:  EOMI, sclera anicteric Neck:     No masses; no thyromegaly Lungs:    Bibasal crackles CV:         Regular rate and rhythm; no murmurs Abd:      + bowel sounds; soft, non-tender; no palpable masses, no distension Ext:    No edema; adequate peripheral perfusion Skin:      Warm and dry; no rash Neuro: alert and oriented x 3 Psych: normal mood and affect  Data Reviewed: Imaging: CT chest 11/16/2016- peripheral GG opacities at the lung base.  Bilateral pulmonary nodules measuring 5 to 6 mm.   CT abdomen pelvis 12/28/2017- visualized lung bases appear clear. CTA 10/29/2018- no PE.  Multiple bilateral hazy pulmonary infiltrates CTA 11/01/2018- positive for PE.  Diffuse bilateral groundglass opacities. CT chest 11/08/2018 increasing groundglass opacities, consolidative changes.  Small pneumomediastinum.   High-resolution CT 02/04/2019- basilar predominant pulmonary fibrosis with traction bronchiectasis.  No honeycombing.  CT high-resolution 05/09/2019-stable pulmonary fibrosis and probable UIP pattern CT high-resolution 10/24/2019-stable pulmonary pattern and probable UIP pattern, coronary artery disease, aortic atherosclerosis  PFTs 04/03/2019 FVC 3.11 [75%], FEV1 2.69 [90%], F/F 87, TLC 4.29 [61%], DLCO 10.94 [44%] Moderate restriction with severe diffusion defect  11/01/2019 FVC 3.23 [28%], FEV1 2.82 [95%], F/F 87, TLC 4.22 [25%], DLCO 15.03 [61%] Minimal restriction with moderate diffusion defect.  01/31/2020 3.11 [9 6%], FEV1 2.62 [89%], F/F 84, TLC 4.41 [63%], DLCO 10.40 [42%] Moderate restriction with severe diffusion defect  6-minute walk test  04/05/2019-408 m, no desats 04/02/2020-306 m, no  desats  Cardiac: Echocardiogram 11/08/2018-LVEF 60-65% mild to moderate AS.  Right ventricle is not well visualized right atrial pressure is estimated at 10 mm.  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  There was an improvement in July and a slight worsening in October.  The PFTs show some variation overall his pulmonary fibrosis has remained stable with persistent dyspnea on exertion.  He will benefit from pulmonary rehab and we have made a referral Discussed weight loss with diet and exercise will still help with his breathing   Nocturnal hypoxia, sleep apnea Continue supplemental oxygen at night Schedule for split-night sleep study  Post nasal drip Patient has sinus congestion , rhinorhea, and post nasal drip.  Flonase and Zyrtec over the counter   Plan/Recommendations: - Flutter valve and mucinex - Referral to pulmonary rehab - Follow-up CT, PFTs in 6 months  [name] MD [facility] 04/02/2020, 10:54 AM  CC: Alex Redwood, MD  Addendum: Split-night sleep study from [facility] 04/13/2020 Severe sleep apnea, AHI 45.8, fragmented sleep with some PLM's.  Minimum O2  sat of 72%  Recommend CPAP/BiPAP titration  [name] MD [facility] 08/27/2020, 1:20 PM

## 2020-04-02 NOTE — Addendum Note (Signed)
Addended by: Vanessa Barbara on: 04/02/2020 02:34 PM   Modules accepted: Orders

## 2020-04-02 NOTE — Patient Instructions (Signed)
We will get a chest x-ray today for better evaluation of your lungs We have reviewed your CT scan and PFTs which shows stable scarring of the lung with moderate reduction in lung capacity secondary to Covid We will await the results of the sleep study to be done at the [place] to determine if we need to continue on oxygen at night  We will make referral to pulmonary rehab  Schedule pulmonary function test and CT scan in 6 to 7 months and follow-up after that.

## 2020-04-06 ENCOUNTER — Other Ambulatory Visit: Payer: Self-pay

## 2020-04-06 ENCOUNTER — Inpatient Hospital Stay: Payer: Medicare Other | Attending: Hematology

## 2020-04-06 DIAGNOSIS — C61 Malignant neoplasm of prostate: Secondary | ICD-10-CM | POA: Insufficient documentation

## 2020-04-06 DIAGNOSIS — G4730 Sleep apnea, unspecified: Secondary | ICD-10-CM | POA: Insufficient documentation

## 2020-04-06 DIAGNOSIS — E8581 Light chain (AL) amyloidosis: Secondary | ICD-10-CM

## 2020-04-06 DIAGNOSIS — Z87891 Personal history of nicotine dependence: Secondary | ICD-10-CM | POA: Insufficient documentation

## 2020-04-06 DIAGNOSIS — Z9981 Dependence on supplemental oxygen: Secondary | ICD-10-CM | POA: Diagnosis not present

## 2020-04-06 DIAGNOSIS — E8809 Other disorders of plasma-protein metabolism, not elsewhere classified: Secondary | ICD-10-CM

## 2020-04-06 LAB — CBC WITH DIFFERENTIAL/PLATELET
Abs Immature Granulocytes: 0.01 10*3/uL (ref 0.00–0.07)
Basophils Absolute: 0 10*3/uL (ref 0.0–0.1)
Basophils Relative: 1 %
Eosinophils Absolute: 0.2 10*3/uL (ref 0.0–0.5)
Eosinophils Relative: 3 %
HCT: 47 % (ref 39.0–52.0)
Hemoglobin: 16 g/dL (ref 13.0–17.0)
Immature Granulocytes: 0 %
Lymphocytes Relative: 19 %
Lymphs Abs: 1.1 10*3/uL (ref 0.7–4.0)
MCH: 31.2 pg (ref 26.0–34.0)
MCHC: 34 g/dL (ref 30.0–36.0)
MCV: 91.6 fL (ref 80.0–100.0)
Monocytes Absolute: 0.5 10*3/uL (ref 0.1–1.0)
Monocytes Relative: 8 %
Neutro Abs: 4.2 10*3/uL (ref 1.7–7.7)
Neutrophils Relative %: 69 %
Platelets: 137 10*3/uL — ABNORMAL LOW (ref 150–400)
RBC: 5.13 MIL/uL (ref 4.22–5.81)
RDW: 13.9 % (ref 11.5–15.5)
WBC: 6 10*3/uL (ref 4.0–10.5)
nRBC: 0 % (ref 0.0–0.2)

## 2020-04-06 LAB — CMP (CANCER CENTER ONLY)
ALT: 24 U/L (ref 0–44)
AST: 25 U/L (ref 15–41)
Albumin: 3.7 g/dL (ref 3.5–5.0)
Alkaline Phosphatase: 88 U/L (ref 38–126)
Anion gap: 7 (ref 5–15)
BUN: 17 mg/dL (ref 8–23)
CO2: 27 mmol/L (ref 22–32)
Calcium: 9.1 mg/dL (ref 8.9–10.3)
Chloride: 108 mmol/L (ref 98–111)
Creatinine: 1.3 mg/dL — ABNORMAL HIGH (ref 0.61–1.24)
GFR, Estimated: 57 mL/min — ABNORMAL LOW (ref >60)
Glucose, Bld: 122 mg/dL — ABNORMAL HIGH (ref 70–99)
Potassium: 3.5 mmol/L (ref 3.5–5.1)
Sodium: 142 mmol/L (ref 135–145)
Total Bilirubin: 1.2 mg/dL (ref 0.3–1.2)
Total Protein: 7.1 g/dL (ref 6.5–8.1)

## 2020-04-07 LAB — KAPPA/LAMBDA LIGHT CHAINS
Kappa free light chain: 23.4 mg/L — ABNORMAL HIGH (ref 3.3–19.4)
Kappa, lambda light chain ratio: 1.34 (ref 0.26–1.65)
Lambda free light chains: 17.5 mg/L (ref 5.7–26.3)

## 2020-04-09 LAB — MULTIPLE MYELOMA PANEL, SERUM
Albumin SerPl Elph-Mcnc: 3.3 g/dL (ref 2.9–4.4)
Albumin/Glob SerPl: 1.2 (ref 0.7–1.7)
Alpha 1: 0.2 g/dL (ref 0.0–0.4)
Alpha2 Glob SerPl Elph-Mcnc: 0.8 g/dL (ref 0.4–1.0)
B-Globulin SerPl Elph-Mcnc: 0.9 g/dL (ref 0.7–1.3)
Gamma Glob SerPl Elph-Mcnc: 0.9 g/dL (ref 0.4–1.8)
Globulin, Total: 2.9 g/dL (ref 2.2–3.9)
IgA: 132 mg/dL (ref 61–437)
IgG (Immunoglobin G), Serum: 869 mg/dL (ref 603–1613)
IgM (Immunoglobulin M), Srm: 56 mg/dL (ref 15–143)
Total Protein ELP: 6.2 g/dL (ref 6.0–8.5)

## 2020-04-10 ENCOUNTER — Telehealth (HOSPITAL_COMMUNITY): Payer: Self-pay

## 2020-04-10 NOTE — Telephone Encounter (Signed)
Attempted to call patient in regards to Pulmonary Rehab - LM on VM °

## 2020-04-13 ENCOUNTER — Other Ambulatory Visit: Payer: Self-pay

## 2020-04-13 ENCOUNTER — Inpatient Hospital Stay (HOSPITAL_BASED_OUTPATIENT_CLINIC_OR_DEPARTMENT_OTHER): Payer: Medicare Other | Admitting: Hematology

## 2020-04-13 VITALS — BP 153/78 | HR 61 | Temp 96.1°F | Resp 17 | Ht 71.0 in | Wt 200.6 lb

## 2020-04-13 DIAGNOSIS — E8581 Light chain (AL) amyloidosis: Secondary | ICD-10-CM | POA: Diagnosis not present

## 2020-04-13 NOTE — Progress Notes (Signed)
HEMATOLOGY/ONCOLOGY CLINIC NOTE  Date of Service: 04/13/20   Patient Care Team: [name], MD as PCP - General (Internal Medicine) Burnell Blanks, MD as PCP - Cardiology (Cardiology) Brunetta Genera, MD as Consulting Physician (Hematology) Nephrology - Dr Madelon Lips MD Dr Henrene Pastor MD - GI  CHIEF COMPLAINTS/PURPOSE OF CONSULTATION:  AL Amyloidosis -with Nephrotic syndrome . No overt CHF pEF  HISTORY OF PRESENTING ILLNESS:   Alex Denault. is a wonderful 79 y.o. male who has been referred to [place] by Dr .[name], MD /Dr Madelon Lips MD for evaluation and management of newly diagnosed Kidney AL Amyloidosis with nephrotic syndrome.  Patient has a history of hypertension, dyslipidemia, coronary artery disease, seizure disorder on Dilantin who was apparently in his usual state of health until 3 months ago when he started developing new onset lower extremity edema. Patient had a UA at the time that showed 4+ protein in 24-hour collection revealed 15 g of protein. Additional workup showed a creatinine of 0.8 with an albumin of 2.3 and negative SPEP and UPEP. Apparently had an elevated K/L SFLC ratio of 5.19.  He was urgently referred by his primary care physician to nephrology for additional evaluation. Patient notes no bleeding issues. No nosebleeds. No periorbital bleeding. No abnormal skin rashes. Has had significant NSAID exposure and use to take a fair amount of naproxen for chronic low back pain but has cut down on this.  Due to lack of clear etiology for his nephrotic syndrome the patient underwent a kidney biopsy on 08/06/2015 accession [id number] which showed AL amyloidosis, lambda immunophenotype, [place] red positive. Noted to have severe arteriosclerosis with moderate tubular interstitial scarring.  Patient was referred to [place] for further evaluation and treatment of his AL Amyloidosis, Patient notes that his leg swelling has improved with diuretic  therapy.  He notes no weight loss. No night sweats. No focal bone pains. No skin rashes. Lungs normal bleeding or bruising.  Patient notes that he had a lot of reading online and he and his wife had an extensive list of questions which were answered in detail.  He notes he has had some chronic issues with upper abdominal pain and nausea and that he follows with Dr. Henrene Pastor and is apparently being scheduled for an EGD and colonoscopy according to his report.  Denies having and lacks tongue. No chest pain and no overt new shortness of breath. Patient is uncertain if an echocardiogram has been ordered or scheduled.  INTERVAL HISTORY: Alex [name] is here for a scheduled follow-up for his AL amyloidosis. We are joined today by his wife, Alex [name]. The patient's last visit with [place] was on 12/11/2019. The pt reports that he is doing well overall.  The pt reports that he recently saw Dr. Vaughan Browner, who feels that his breathing impairment is likely permanent. Pt is using 2 liters of oxygen at night and none during the day. His oxygen saturation is not dropping below 90% when active during the day but he feels short of breath. Pt has previously been diagnosed with sleep apnea but has not been using his CPAP machine. He will be completing a sleep study tonight. He has received two Moderna vaccines and his annual flu vaccine. Pt is experiencing intermittent calf cramps that wake him up in the night.   Lab results (04/06/20) of CBC w/diff and CMP is as follows: all values are WNL except for PLT at 137K, Glucose at 122, Creatinine at 1.30, GFR Est  at 57. 04/06/2020 MMP shows all values are WNL. 04/06/2020 K/L light chains is as follows: Kappa free light chain at 23.4, Lamda free light chains at 17.5, K/L light chain ratio at 1.34  On review of systems, pt reports chest pain, shortness of breath, calf pain/cramping and denies new bone pain, calf swelling/redness and any other symptoms.    MEDICAL HISTORY:   Past Medical History:  Diagnosis Date  . AL amyloidosis [facility]) oncologist-- dr Candise Che   Renal and Systemic , dx 04/ 2018;  renal bx 08-05-2016 and bone marrow bx 08-29-2016;  8 month chemotherapy then stell cell transplant 12/ 2018 then chemo again;  since then been on oral chemo daily (11-07-2019 per pt has been on hold until after prostate seed implants)  . Arthritis   . CAD (coronary artery disease) cardiologist-- dr Clifton James;  also [facility] [place] dr d. Thurnell Lose   moderate nonobstrucive disease by cath 2011; Lexiscan Myoview 6/14--Normal study, no scar or ischemia, EF 62%;  11-14-2014 nuclear study low risk w/ normal perfusion, nuclear ef 67%  . CKD (chronic kidney disease), stage III (HCC)   . Diverticulosis of colon   . GERD (gastroesophageal reflux disease)   . H/O stem cell transplant (HCC) 03/2017  . Hiatal hernia   . History of 2019 novel coronavirus disease (COVID-19) 09/2018   [facility] 06[facility], hospital admission 10-28-2019 respiratory failure/ pneumometrostinom/ PE ;  was not intubated , treatment with remdesivir/ steroids/ actemra/ plasma  . History of basal cell carcinoma (BCC)   . History of colon polyps   . History of MRSA infection    culture positive  . History of pulmonary embolus (PE) 10/2018   with covid 19, completed treatment with eliquis  . History of seizures    11-07-2019  per pt last one 1970s, unknown cause  . History of vertebral compression fracture 1988   lumbar  . Hyperlipidemia   . Hypertension   . Neuropathy due to chemotherapeutic drug (HCC)    fingers tips and feet  . Nocturia   . On supplemental oxygen therapy    post covid 19;   11-07-2019 pt uses O2 every night 2L via [place],  and uses supplements during the day when walking around neighborhood and goes out to appointments/ store that he has to walk a distance  . OSA (obstructive sleep apnea)    study in epic 03-18-2005 mild to moderate   (11-07-2019   per pt used cpap up until when he lost alot of weight, stoped approx. 2012  . Prostate cancer [facility]) urologist--- dr pace/  oncology-- dr Kathrynn Running   dx 03/ 2021  . Pulmonary fibrosis (HCC) 09/2018   pulmonologist-- dr Isaiah Serge;   post covid 19  . SOB (shortness of breath) on exertion    11-07-2019  per pt walks around neighborhood for 30 minutes twice daily, he does have to stop frequently d/t sob and since weather has been hot he uses his supplemental oxygen;  stated he has a pulse ox. at home , when he is sitting down O2 sat 96 -97% on RA when up walking around the house O2 sat 93-94% on RA;  pt stated he carry's his O2 w/ him when he goes to doctor appts/store d/t distance  . Wears glasses   . Wears hearing aid in both ears   Obstructive sleep apnea Gastroesophageal reflux disease Aortic stenosis Erectile dysfunction Peripheral neuropathy Seizure disorder   SURGICAL HISTORY: Past  Surgical History:  Procedure Laterality Date  . CARDIAC CATHETERIZATION  05-21-1999  and 12-29-2000  _0   dr Maryjo [place] nuclear study;  moderate nonobstructive disease involving LAD and LCx;  second done due to in Reversal research trial  . CARDIAC CATHETERIZATION  07/07/2009  dr [name]   nonobstructive disease w/ normal LVF  . CARPAL TUNNEL RELEASE Left 1994  . CERVICAL FUSION  1988   C2 -- 4  . CYSTOSCOPY N/A 11/13/2019   Procedure: CYSTOSCOPY FLEXIBLE;  Surgeon: [name], MD;  Location: [facility];  Service: Urology;  Laterality: N/A;  no seeds detected per Dr. Claudia Desanctis  . FOOT SURGERY Bilateral x3 last one 1980s   bunionectomy great toe--- x2 right and x1 left  . KNEE ARTHROSCOPY Left 1996  . NASAL SEPTUM SURGERY  x3 last one 08-09-2010 @ [place]  . PAROTIDECTOMY Right 03/09/2017  . RADIOACTIVE SEED IMPLANT N/A 11/13/2019   Procedure: RADIOACTIVE SEED IMPLANT/BRACHYTHERAPY IMPLANT;  Surgeon: [name], MD;  Location[facility];  Service: Urology;   Laterality: N/A;  ONLY NEEDS 90 MIN FOR ALL PROCEDURES  . ROTATOR CUFF REPAIR Bilateral x1 rigth (unsure date);  x2 left last one 09-07-2009 _1 ;  right  . SPACE OAR INSTILLATION N/A 11/13/2019   Procedure: SPACE OAR INSTILLATION;  Surgeon: [name], MD;  Location: [facility];  Service: Urology;  Laterality: N/A;  . TRIGGER FINGER RELEASE Bilateral x2  last one 2001 approx.   x1 finger left hand;  x2 fingers right hand  . UMBILICAL HERNIA REPAIR  01/19/2011  . VASECTOMY  approx. 26 (with anesthesia)    SOCIAL HISTORY: Social History   Socioeconomic History  . Marital status: Married    Spouse name: Not on file  . Number of children: 2  . Years of education: 53  . Highest education level: Not on file  Occupational History  . Occupation: Retired  Tobacco Use  . Smoking status: Former Smoker    Packs/day: 3.00    Years: 20.00    Pack years: 60.00    Types: Cigarettes    Quit date: 04/25/1969    Years since quitting: 51.0  . Smokeless tobacco: Former Systems developer    Types: [place] date: 11/07/1983  Vaping Use  . Vaping Use: Never used  Substance and Sexual Activity  . Alcohol use: Yes    Comment: occasional  . Drug use: Never  . Sexual activity: Not Currently    Comment: SHIM 1. Reports erectile dysfunction.;    Other Topics Concern  . Not on file  Social History Narrative   ** Merged History Encounter **   Lives at home w/ his wife   Right-handed   Caffeine: occasional Pepsi       Social Determinants of Health   Financial Resource Strain: Not on file  Food Insecurity: Not on file  Transportation Needs: Not on file  Physical Activity: Not on file  Stress: Not on file  Social Connections: Not on file  Intimate Partner Violence: Not on file  Patient is currently retired.   FAMILY HISTORY: Family History  Problem Relation Age of Onset  . Diabetes Father   . Heart disease Father   . Heart attack Father 71  . Hypertension Father   .  Hearing loss Mother   . Eczema Sister   . Cancer Sister        1 sister had cancer but patient unsure of type  .  Hyperlipidemia Other   . Diabetes Other   . Hypertension Other   . Seizures Other   . Thyroid disease Other   . Colon cancer Neg Hx   . Stroke Neg Hx   . Breast cancer Neg Hx   . Pancreatic cancer Neg Hx   . Prostate cancer Neg Hx     ALLERGIES:  is allergic to ramipril.  MEDICATIONS:  Current Outpatient Medications  Medication Sig Dispense Refill  . Ascorbic Acid (VITAMIN C) 1000 MG tablet Take 1,000 mg by mouth 2 (two) times a day.     [name] Kitchen aspirin EC 81 MG tablet Take 81 mg by mouth daily.    [name] Kitchen atorvastatin (LIPITOR) 40 MG tablet Take 40 mg by mouth at bedtime.     [name] Kitchen b complex vitamins capsule Take 1 capsule by mouth daily.    . Cholecalciferol 25 MCG (1000 UT) tablet Take 1,000 Units by mouth 2 (two) times a day.     . ezetimibe (ZETIA) 10 MG tablet Take 10 mg by mouth daily.     . fluticasone (FLONASE) 50 MCG/ACT nasal spray Place 2 sprays into both nostrils daily. 16 g 5  . folic acid (FOLVITE) 1 MG tablet Take 1 mg by mouth at bedtime.     . furosemide (LASIX) 40 MG tablet Take 40 mg by mouth daily.     . isosorbide mononitrate (IMDUR) 15 mg TB24 24 hr tablet Take 15 mg by mouth daily.    . Multiple Vitamin (MULTI-VITAMINS) TABS Take 1 tablet by mouth daily.    . nitrofurantoin, macrocrystal-monohydrate, (MACROBID) 100 MG capsule Take 100 mg by mouth 2 (two) times daily.    . nitroGLYCERIN (NITROSTAT) 0.4 MG SL tablet Place 1 tablet (0.4 mg total) under the tongue every 5 (five) minutes as needed for chest pain. 25 tablet 3  . pantoprazole (PROTONIX) 40 MG tablet Take 40 mg by mouth daily.     . potassium chloride SA (KLOR-CON) 20 MEQ tablet Take 20 mEq by mouth daily.    . tamsulosin (FLOMAX) 0.4 MG CAPS capsule Take 1 capsule (0.4 mg total) by mouth daily after supper. 30 capsule 3  . triamcinolone cream (KENALOG) 0.1 % Apply 1 application topically 2 (two) times  daily as needed.    . vitamin B-12 (CYANOCOBALAMIN) 500 MCG tablet Take 500 mcg by mouth 2 (two) times daily.     No current facility-administered medications for this visit.    REVIEW OF SYSTEMS:   A 10+ POINT REVIEW OF SYSTEMS WAS OBTAINED including neurology, dermatology, psychiatry, cardiac, respiratory, lymph, extremities, GI, GU, Musculoskeletal, constitutional, breasts, reproductive, HEENT.  All pertinent positives are noted in the HPI.  All others are negative.   PHYSICAL EXAMINATION: ECOG PERFORMANCE STATUS: 1 - Symptomatic but completely ambulatory  Vitals:   04/13/20 1107  BP: (!) 153/78  Pulse: 61  Resp: 17  Temp: (!) 96.1 F (35.6 C)  SpO2: 99%   Filed Weights   04/13/20 1107  Weight: 200 lb 9.6 oz (91 kg)   .Body mass index is 27.98 kg/m.   GENERAL:alert, in no acute distress and comfortable SKIN: no acute rashes, no significant lesions EYES: conjunctiva are pink and non-injected, sclera anicteric OROPHARYNX: MMM, no exudates, no oropharyngeal erythema or ulceration NECK: supple, no JVD LYMPH:  no palpable lymphadenopathy in the cervical, axillary or inguinal regions LUNGS: clear to auscultation b/l with normal respiratory effort HEART: regular rate & rhythm ABDOMEN:  normoactive bowel sounds , non tender, not distended.  No palpable hepatosplenomegaly.  Extremity: no pedal edema PSYCH: alert & oriented x 3 with fluent speech NEURO: no focal motor/sensory deficits  LABORATORY DATA:  I have reviewed the data as listed  . CBC Latest Ref Rng & Units 04/06/2020 12/04/2019 11/18/2019  WBC 4.0 - 10.5 K/uL 6.0 5.5 7.4  Hemoglobin 13.0 - 17.0 g/dL 16.0 14.8 14.9  Hematocrit 39.0 - 52.0 % 47.0 44.6 44.8  Platelets 150 - 400 K/uL 137(L) 153 131(L)    CMP Latest Ref Rng & Units 04/06/2020 12/04/2019 11/18/2019  Glucose 70 - 99 mg/dL 122(H) 112(H) 102(H)  BUN 8 - 23 mg/dL _0 Creatinine 0.61 - 1.24 mg/dL 1.30(H) 1.37(H) 1.43(H)  Sodium 135 - 145 mmol/L 142  142 141  Potassium 3.5 - 5.1 mmol/L 3.5 4.0 4.0  Chloride 98 - 111 mmol/L 108 108 106  CO2 22 - 32 mmol/L _1 Calcium 8.9 - 10.3 mg/dL 9.1 9.5 9.6  Total Protein 6.5 - 8.1 g/dL 7.1 6.9 7.0  Total Bilirubin 0.3 - 1.2 mg/dL 1.2 1.5(H) 1.5(H)  Alkaline Phos 38 - 126 U/L 88 81 73  AST 15 - 41 U/L _2 ALT 0 - 44 U/L _3 07/09/2019 Prostate Patholgy Report ([id number]):    RADIOGRAPHIC STUDIES: I have personally reviewed the radiological images as listed and agreed with the findings in the report. DG Chest 2 View  Result Date: 04/02/2020 CLINICAL DATA:  Dyspnea. EXAM: CHEST - 2 VIEW COMPARISON:  October 15, 2019. FINDINGS: Stable cardiomediastinal silhouette. No pneumothorax or pleural effusion is noted. Stable irregular densities are noted in both lungs most consistent with post infectious scarring. No definite acute abnormality is noted. Bony thorax is unremarkable. IMPRESSION: Stable bilateral scarring. No acute cardiopulmonary abnormality seen. Electronically Signed   By: Marijo Conception M.D.   On: 04/02/2020 11:39   Result status: Edited Result - FINAL                              *[facility].                        [place], [place] [address]                            [phone]  ------------------------------------------------------------------- Transthoracic Echocardiography  (Report amended )  Patient:    Alex [name], Alex [name] Alex #:       [id number] Study Date: 08/24/2016 Gender:     M Age:        82 Height:     180.3 cm Weight:     90.6 kg BSA:        2.15 m^2 Pt. Status: Room:   ATTENDING    Darlina Guys, MD  Willia Craze, Christopher  REFERRING    [place], [place], Outpatient  SONOGRAPHER  Roseanna Rainbow  cc:  ------------------------------------------------------------------- LV EF: 65% -    70%  ------------------------------------------------------------------- Indications:      Aortic stenosis 424.1.  -------------------------------------------------------------------  History:   PMH:  Amyloidosis.  Coronary artery disease.  Angina pectoris.  Risk factors:  Hypertension. Dyslipidemia.  ------------------------------------------------------------------- Study Conclusions  - Left ventricle: The cavity size was normal. Wall thickness was   increased in a pattern of mild LVH. Systolic function was   vigorous. The estimated ejection fraction was in the range of 65%   to 70%. Wall motion was normal; there were no regional wall   motion abnormalities. Doppler parameters are consistent with   abnormal left ventricular relaxation (grade 1 diastolic   dysfunction). - Aortic valve: Valve mobility was mildly restricted. - Aortic root: The aortic root was mildly dilated. - Mitral valve: Calcified annulus.  Impressions:  - Vigorous LV systolic function; mild diastolic dysfunction; mild   LVH; calcified aortic valve with no significant AS (peak velocity   2 m/s; mean gradient 9 mmHg); mildly dilated aortic root.    ASSESSMENT & PLAN:   79 y.o.  male with above-mentioned multiple medical comorbidities with  #1 Biopsy proven Renal and Systemic AL Amyloidosis Initial BM Bx shows 9% plasma cells and amyloid deposits. Echo shows normal systolic function with only grade 1 diastolic dysfunction. Skeletal survey with no lytic lesions suggestive of multiple myeloma. Serum K/L ratio increased at about 6.22 on diagnosis and has now improved to 1.5 (wnl with normal kappa lambda serum free light chain ratio ) UPEP shows 24h protein improved from 15g to 5g daily 05/29/18 24hour UPEP no M spike   01/12/18 ECHO revealed LV EF of 60-65%   #2  Nephrotic Syndrome - likely related to AL amyloidosis.  Urine Study 07/26/17 showed Protein at 2.232g.  -07/19/17 the pt had a BM Bx exam which  revealed Normocellular bone marrow (30-40%) with 2-3% plasma cells and focal amyloid deposition. UPEP 05/29/2018 - showed no M spike and total urinary protein of 1109m/24h -- resolution of nephrotic syndrome.  #3 Prostatic Adenocarcinoma - Gleason Score 3+4=7  PLAN: -Discussed pt labwork, 04/06/20; PLT is okay, other blood counts are nml, liver function looks good, renal function is stable, if not better. K/L light chain ratio is WNL and no M Protein is observed.  -Pt continues to be in hematologic remssion from AL amyloidosis.  -Advised pt that AL amyloidosis is not curative but may remain in remission for a fair period of time.  -Advised pt that [place] is immunosuppressive. Due to the pandemic and risk of recurrent Pneumonia with his lung scarring from covid and tenuous lung function would not recommend we restart at this time. Pulmonary function is the priority at this time.  -Discussed CDC guidelines regarding the COVID19 booster. Recommend pt seek the [facility]. -Advised pt that Lipitor can cause muscle cramps. Recommend pt stay well hydrated and discuss with PCP. -Recommend pt complete sleep study as scheduled.  -Continue 50K IU Vitamin D weekly. Goal Vitamin D is 60-90. -Will see back in 4 months, with labs 1 week prior.  #4 Patient Active Problem List   Diagnosis Date Noted  . Malignant neoplasm of prostate ([facility] 08/06/2019  . Pneumonia due to COVID-19 virus 10/29/2018  . Acute respiratory failure with hypoxia ([facility] 10/29/2018  . Thrombocytopenia ([facility] 10/29/2018  . Left lower lobe pneumonia 04/22/2018  . Sepsis due to pneumonia ([facility] 04/22/2018  . Engraftment syndrome following stem cell transplantation ([place] 04/27/2017  . H/O autologous stem cell transplant ([place] 04/10/2017  . Aortic stenosis 03/08/2017  . Hearing loss 03/08/2017  . OSA (obstructive sleep apnea) 03/08/2017  . Parotid mass 02/23/2017  .  Dysfunction of right eustachian tube 02/06/2017  . Presbycusis  of both ears 02/06/2017  . Folliculitis 53/64/6803  . AL amyloidosis ([place]) 08/21/2016  . Umbilical hernia 21/22/4825  . DIZZINESS 02/19/2010  . CAD, NATIVE VESSEL 07/21/2009  . CHEST PAIN-UNSPECIFIED 07/21/2009  . HYPERLIPIDEMIA 04/30/2007  . Essential hypertension 04/30/2007  . GASTROESOPHAGEAL REFLUX DISEASE 04/30/2007  . SEIZURE DISORDER 04/30/2007  . COMPRESSION FRACTURE, L1 VERTEBRA 04/30/2007  . DIVERTICULOSIS, COLON, HX OF 04/30/2007  . Other postprocedural status(V45.89) 04/30/2007  -Continue follow-up with primary care physician for management of other medical co-morbidities. -Recommend if pt experiences spike fever to report to hospital with transplant team    FOLLOW UP: -RTC with Dr [name] with labs in 4 months. Plz schedule these labs 1 week prior to clinic visit   The total time spent in the appt was 25 minutes and more than 50% was on counseling and direct patient cares.  All of the patient's questions were answered with apparent satisfaction. The patient knows to call the clinic with any problems, questions or concerns.   Sullivan Lone MD [place] AAHIVMS [facility] Hematology/Oncology Physician [facility]  (Office):       [phone] (Work cell):  [phone] (Fax):           [phone]  I, Yevette Edwards, am acting as a scribe for Dr. Sullivan Lone.   .I have reviewed the above documentation for accuracy and completeness, and I agree with the above. Brunetta Genera MD

## 2020-04-15 ENCOUNTER — Encounter (HOSPITAL_COMMUNITY): Payer: Self-pay | Admitting: *Deleted

## 2020-04-15 NOTE — Progress Notes (Signed)
Received referral from Dr.Mannam for this pt to participate in pulmonary rehab with the the diagnosis of Dyspnea unspecified type.  Pt is s/p Covid - 19 Pneumonia fibrosis.  Pt hospitalized at [facility] 10/28/18- 11/18/18. Clinical review of pt follow up appt on 12/9 Pulmonary office note.  Also reviewed follow up progress reports with Dr. [name] and Dr. Tammi Klippel.  Called and spoke to pt who is a English as a second language teacher regarding usage of [place] authorization or [facility] Medicare.  Pt would like to use [facility] Medicare and will continue to have the same plan in 2022.  Pt advised that we are scheduling for February and his insurance will be checked the first of the year for 2022 benefits.  Pt given general  Information about the pulmonary rehab program and exercise guidelines he can do during this waiting period.  Will send pt a brochure for additional information. Pt with Covid Risk Score - 5. Pt appropriate for scheduling for Pulmonary rehab.  Will forward to support staff for scheduling and verification of insurance eligibility/benefits with pt consent. Cherre Huger, BSN Cardiac and Training and development officer

## 2020-04-29 ENCOUNTER — Telehealth: Payer: Self-pay | Admitting: Pulmonary Disease

## 2020-04-29 NOTE — Telephone Encounter (Signed)
Spoke with pt  He is asking if we received the sleep study results  She states that this was done at [facility]  I checked and we have not received  He states will call his PCP who he thinks ordered the test and have them fax to [place]- up front fax number was provided

## 2020-05-04 ENCOUNTER — Telehealth (HOSPITAL_COMMUNITY): Payer: Self-pay

## 2020-05-04 NOTE — Telephone Encounter (Signed)
Pt insurance is active and benefits verified through [facility] Medicare Co-pay 0, DED 0/0 met, out of pocket $900/0 met, co-insurance 0%. no pre-authorization required. Passport, [place]/[employer] 05/04/2020_0 :21am, REF# [id number]

## 2020-05-08 ENCOUNTER — Encounter (HOSPITAL_COMMUNITY): Payer: Self-pay

## 2020-05-08 NOTE — Telephone Encounter (Signed)
Attempted to call patient in regards to Pulmonary Rehab - LM on VM Mailed letter 

## 2020-05-19 ENCOUNTER — Telehealth: Payer: Self-pay | Admitting: Cardiovascular Disease

## 2020-05-19 ENCOUNTER — Encounter: Payer: Self-pay | Admitting: *Deleted

## 2020-05-19 ENCOUNTER — Telehealth: Payer: Self-pay | Admitting: Pulmonary Disease

## 2020-05-19 NOTE — Telephone Encounter (Signed)
Spoke with patient and his wife.  Pt is requiring more assistance at home w ADLs.   He is becoming more SOB w/ daily activities.  Trying to get into pulmonary rehab.      I adv that they should request what is needed from primary care, or perhaps his pulmonologist, since his symptoms have progressed since having covid pneumonia and fibrosis.   I did schedule his echo.  He has known mild aortic stenosis.  Scheduled f/u with Dr. [name] as well.

## 2020-05-19 NOTE — Telephone Encounter (Signed)
Called and spoke with wife Mardene Celeste to let her know Dr. Vaughan Browner was ok with writing letter. Advised it would be placed up front for her to pick up tomorrow. Letter has been done, printed and placed up front. Nothing further needed at this time.

## 2020-05-19 NOTE — Telephone Encounter (Signed)
Ok to give caregiver letter as requested

## 2020-05-19 NOTE — Telephone Encounter (Addendum)
Per DPR- ok to speak with Ova Freshwater with [facility]  She states that she is needing Dr Vaughan Browner to write a letter stating that pt needs her to be his caregiver  She states he is becoming more dependent on her, and has upcoming appts he will need her to attend  [facility], and pt states it needs to state his current condition, prognosis and the fact he requires supplmental o2  Please advise, thanks!

## 2020-05-19 NOTE — Telephone Encounter (Signed)
Alex [name] is calling requesting to speak with [name] due to requesting a reference letter from Dr. [name] for Alex [name] to become [name]'s full time caregiver. She states she is needing to submit this to the [place]. Please advise.

## 2020-05-20 DIAGNOSIS — H35033 Hypertensive retinopathy, bilateral: Secondary | ICD-10-CM | POA: Insufficient documentation

## 2020-06-05 ENCOUNTER — Ambulatory Visit (HOSPITAL_COMMUNITY): Payer: Medicare Other | Attending: Cardiology

## 2020-06-05 ENCOUNTER — Other Ambulatory Visit: Payer: Self-pay

## 2020-06-05 DIAGNOSIS — I350 Nonrheumatic aortic (valve) stenosis: Secondary | ICD-10-CM | POA: Diagnosis not present

## 2020-06-05 LAB — ECHOCARDIOGRAM COMPLETE
AR max vel: 1.2 cm2
AV Area VTI: 1.19 cm2
AV Area mean vel: 1.49 cm2
AV Mean grad: 17 mmHg
AV Peak grad: 32 mmHg
Ao pk vel: 2.83 m/s
Area-P 1/2: 1.93 cm2
S' Lateral: 2.3 cm

## 2020-06-05 MED ORDER — PERFLUTREN LIPID MICROSPHERE
1.0000 mL | INTRAVENOUS | Status: AC | PRN
  Administered 2020-06-05: 2 mL via INTRAVENOUS

## 2020-06-18 ENCOUNTER — Telehealth (HOSPITAL_COMMUNITY): Payer: Self-pay

## 2020-06-18 NOTE — Telephone Encounter (Signed)
Pt returning call to confirm appointment tomorrow at 0930. Instructed pt to wear mask and closed toe/heel shoes for walk test. Pt verbalized understanding.

## 2020-06-18 NOTE — Telephone Encounter (Signed)
Called pt to confirm orientation appointment with pulmonary rehab tomorrow 06/19/20 at 0900. Requested that pt call back to confirm.

## 2020-06-19 ENCOUNTER — Encounter (HOSPITAL_COMMUNITY)
Admission: RE | Admit: 2020-06-19 | Discharge: 2020-06-19 | Disposition: A | Discharge status: Home / Self Care | Payer: Medicare Other | Source: Ambulatory Visit | Attending: Pulmonary Disease | Admitting: Pulmonary Disease

## 2020-06-19 ENCOUNTER — Encounter (HOSPITAL_COMMUNITY): Payer: Self-pay

## 2020-06-19 ENCOUNTER — Other Ambulatory Visit: Payer: Self-pay

## 2020-06-19 VITALS — BP 124/88 | HR 78 | Ht 71.0 in | Wt 203.0 lb

## 2020-06-19 DIAGNOSIS — R0600 Dyspnea, unspecified: Secondary | ICD-10-CM

## 2020-06-19 NOTE — Progress Notes (Signed)
Alex [name]. 80 y.o. male Pulmonary Rehab Orientation Note This patient who was referred to Pulmonary rehab by Dr. Vaughan [name] with the diagnosis of Dyspnea and Pulmonary Fibrosis arrived today in Cardiac and Pulmonary Rehab. He arrived ambulatory with steady gait. Pt states he does have a rolator that he uses for longer distances. However, his wife dropped him off at the [facility] entrance. He does not carry portable oxygen. Per pt, he uses oxygen at night when going to sleep. Color good, skin warm and dry. Patient is oriented to time and place. Patient's medical history, psychosocial health, and medications reviewed. Psychosocial assessment reveals pt lives with their spouse. Pt is currently retired. Pt hobbies include fishing. Pt reports his stress level is low. Areas of stress/anxiety include normal every day stress. PHQ2/9 score 0/4. Pt shows good coping skills with positive outlook. Alex [name] was offered emotional support and reassurance. Will continue to monitor and evaluate progress toward psychosocial goal(s) of improving quality of life. Physical assessment reveals heart rate is normal, however heart murmer osculated, breath sounds clear bilaterally in the lower lobes fine crackles in the lower upon inspiration. Grip strength equal, strong. Distal pulses palpable, radials 2+, posterior tib 1+, no lower edema noted. Patient reports he does take medications as prescribed. Patient states he follows a Regular diet. The patient reports no specific efforts to gain or lose weight. Patient's weight will be monitored closely. Demonstration and practice of PLB using pulse oximeter. Patient able to return demonstration satisfactorily. Safety and hand hygiene in the exercise area reviewed with patient. Patient voices understanding of the information reviewed. Department expectations discussed with patient and achievable goals were set. The patient shows enthusiasm about attending the program and we look forward to  working with this nice gentleman. The patient completed a 6 min walk test today and is scheduled to begin exercise on 06/23/20.  45 minutes was spent on a variety of activities such as assessment of the patient, obtaining baseline data including height, weight, BMI, and grip strength, verifying medical history, allergies, and current medications, and teaching patient strategies for performing tasks with less respiratory effort with emphasis on pursed lip breathing.  Alex Duff MS, ACSM CEP 11:33 AM 06/19/2020

## 2020-06-19 NOTE — Progress Notes (Signed)
Pulmonary Individual Treatment Plan  Patient Details  Name: Alex [name]. MRN: [id number] Date of Birth: 06/07/1940 Referring Provider:    Initial Encounter Date:   Visit Diagnosis: Dyspnea, unspecified type  Patient's Home Medications on Admission:   Current Outpatient Medications:  .  Ascorbic Acid (VITAMIN C) 1000 MG tablet, Take 1,000 mg by mouth 2 (two) times a day. , Disp: , Rfl:  .  aspirin EC 81 MG tablet, Take 81 mg by mouth daily., Disp: , Rfl:  .  atorvastatin (LIPITOR) 40 MG tablet, Take 40 mg by mouth at bedtime. , Disp: , Rfl:  .  b complex vitamins capsule, Take 1 capsule by mouth daily., Disp: , Rfl:  .  Cholecalciferol 25 MCG (1000 UT) tablet, Take 1,000 Units by mouth 2 (two) times a day. , Disp: , Rfl:  .  ezetimibe (ZETIA) 10 MG tablet, Take 10 mg by mouth daily.  (Patient not taking: Reported on 06/19/2020), Disp: , Rfl:  .  fluticasone (FLONASE) 50 MCG/ACT nasal spray, Place 2 sprays into both nostrils daily., Disp: 16 g, Rfl: 5 .  folic acid (FOLVITE) 1 MG tablet, Take 1 mg by mouth at bedtime. , Disp: , Rfl:  .  furosemide (LASIX) 40 MG tablet, Take 40 mg by mouth daily. , Disp: , Rfl:  .  isosorbide mononitrate (IMDUR) 15 mg TB24 24 hr tablet, Take 15 mg by mouth daily., Disp: , Rfl:  .  Multiple Vitamin (MULTI-VITAMINS) TABS, Take 1 tablet by mouth daily., Disp: , Rfl:  .  nitrofurantoin, macrocrystal-monohydrate, (MACROBID) 100 MG capsule, Take 100 mg by mouth 2 (two) times daily., Disp: , Rfl:  .  nitroGLYCERIN (NITROSTAT) 0.4 MG SL tablet, Place 1 tablet (0.4 mg total) under the tongue every 5 (five) minutes as needed for chest pain., Disp: 25 tablet, Rfl: 3 .  pantoprazole (PROTONIX) 40 MG tablet, Take 40 mg by mouth daily. , Disp: , Rfl:  .  potassium chloride SA (KLOR-CON) 20 MEQ tablet, Take 20 mEq by mouth daily., Disp: , Rfl:  .  tamsulosin (FLOMAX) 0.4 MG CAPS capsule, Take 1 capsule (0.4 mg total) by mouth daily after supper., Disp: 30 capsule,  Rfl: 3 .  triamcinolone cream (KENALOG) 0.1 %, Apply 1 application topically 2 (two) times daily as needed., Disp: , Rfl:  .  vitamin B-12 (CYANOCOBALAMIN) 500 MCG tablet, Take 500 mcg by mouth 2 (two) times daily., Disp: , Rfl:   Past Medical History: Past Medical History:  Diagnosis Date  . AL amyloidosis [facility]) oncologist-- dr [name]   Renal and Systemic , dx 04/ 2018;  renal bx 08-05-2016 and bone marrow bx 08-29-2016;  8 month chemotherapy then stell cell transplant 12/ 2018 then chemo again;  since then been on oral chemo daily (11-07-2019 per pt has been on hold until after prostate seed implants)  . Arthritis   . CAD (coronary artery disease) cardiologist-- dr [name];  also followed by cardiologist w/ [facility] in [place], dr [name]   moderate nonobstrucive disease by cath 2011; Lexiscan Myoview 6/14--Normal study, no scar or ischemia, EF 62%;  11-14-2014 nuclear study low risk w/ normal perfusion, nuclear ef 67%  . CKD (chronic kidney disease), stage III ([place])   . Diverticulosis of colon   . GERD (gastroesophageal reflux disease)   . H/O stem cell transplant ([place]) 03/2017  . Hiatal hernia   . History of 2019 novel coronavirus disease (COVID-19) 09/2018   [facility] hospital  admission 10-28-2019 respiratory failure/ pneumometrostinom/ PE ;  was not intubated , treatment with remdesivir/ steroids/ actemra/ plasma  . History of basal cell carcinoma (BCC)   . History of colon polyps   . History of MRSA infection    culture positive  . History of pulmonary embolus (PE) 10/2018   with covid 19, completed treatment with eliquis  . History of seizures    11-07-2019  per pt last one 1970s, unknown cause  . History of vertebral compression fracture 1988   lumbar  . Hyperlipidemia   . Hypertension   . Neuropathy due to chemotherapeutic drug (HCC)    fingers tips and feet  . Nocturia   . On supplemental oxygen therapy    post covid 19;   11-07-2019 pt uses O2  every night 2L via [place],  and uses supplements during the day when walking around neighborhood and goes out to appointments/ store that he has to walk a distance  . OSA (obstructive sleep apnea)    study in epic 03-18-2005 mild to moderate   (11-07-2019  per pt used cpap up until when he lost alot of weight, stoped approx. 2012  . Prostate cancer [facility]) urologist--- dr pace/  oncology-- dr Tammi Klippel   dx 03/ 2021  . Pulmonary fibrosis ([place]) 09/2018   pulmonologist-- dr Vaughan Browner;   post covid 19  . SOB (shortness of breath) on exertion    11-07-2019  per pt walks around neighborhood for 30 minutes twice daily, he does have to stop frequently d/t sob and since weather has been hot he uses his supplemental oxygen;  stated he has a pulse ox. at home , when he is sitting down O2 sat 96 -97% on RA when up walking around the house O2 sat 93-94% on RA;  pt stated he carry's his O2 w/ him when he goes to doctor appts/store d/t distance  . Wears glasses   . Wears hearing aid in both ears     Tobacco Use: Social History   Tobacco Use  Smoking Status Former Smoker  . Packs/day: 3.00  . Years: 20.00  . Pack years: 60.00  . Types: Cigarettes  . Quit date: 04/25/1969  . Years since quitting: 37.1  Smokeless Tobacco Former Systems developer  . Types: Chew  . Quit date: 11/07/1983    Labs: Recent Review Flowsheet Data    Labs for ITP Cardiac and Pulmonary Rehab Latest Ref Rng & Units 07/05/2009 07/06/2009 10/29/2018 11/08/2018   Cholestrol 0 - 200 mg/dL - 148 ... - -   LDLCALC 0 - 99 mg/dL - 79 ... - -   HDL >39 mg/dL - 50 - -   Trlycerides <150 mg/dL - 93 103 -   PHART 7.350 - 7.450 - - - 7.528(H)   PCO2ART 32.0 - 48.0 mmHg - - - 27.2(L)   HCO3 20.0 - 28.0 mmol/L - - - 22.7   TCO2 22 - 32 mmol/L 27 - - 24   O2SAT % - - - 90.0      Capillary Blood Glucose: Lab Results  Component Value Date   GLUCAP 113 (H) 11/09/2018   GLUCAP 143 (H) 11/08/2018   GLUCAP 115 (H) 11/08/2018   GLUCAP 153 (H) 11/08/2018   GLUCAP  112 (H) 11/08/2018     Pulmonary Assessment Scores:  Pulmonary Assessment Scores    Row Name 06/19/20 1124         ADL UCSD   ADL Phase Entry     SOB Score  total 91           CAT Score   CAT Score 26           mMRC Score   mMRC Score 4           UCSD: Self-administered rating of dyspnea associated with activities of daily living (ADLs) 6-point scale (0 = "not at all" to 5 = "maximal or unable to do because of breathlessness")  Scoring Scores range from 0 to 120.  Minimally important difference is 5 units  CAT: CAT can identify the health impairment of COPD patients and is better correlated with disease progression.  CAT has a scoring range of zero to 40. The CAT score is classified into four groups of low (less than 10), medium (10 - 20), high (21-30) and very high (31-40) based on the impact level of disease on health status. A CAT score over 10 suggests significant symptoms.  A worsening CAT score could be explained by an exacerbation, poor medication adherence, poor inhaler technique, or progression of COPD or comorbid conditions.  CAT MCID is 2 points  mMRC: mMRC (Modified Medical Research Council) Dyspnea Scale is used to assess the degree of baseline functional disability in patients of respiratory disease due to dyspnea. No minimal important difference is established. A decrease in score of 1 point or greater is considered a positive change.   Pulmonary Function Assessment:  Pulmonary Function Assessment - 06/19/20 0952      Breath   Shortness of Breath Limiting activity;Yes           Exercise Target Goals: Exercise Program Goal: Individual exercise prescription set using results from initial 6 min walk test and THRR while considering  patient's activity barriers and safety.   Exercise Prescription Goal: Initial exercise prescription builds to 30-45 minutes a day of aerobic activity, 2-3 days per week.  Home exercise guidelines will be given to patient  during program as part of exercise prescription that the participant will acknowledge.  Activity Barriers & Risk Stratification:  Activity Barriers & Cardiac Risk Stratification - 06/19/20 0949      Activity Barriers & Cardiac Risk Stratification   Activity Barriers Shortness of Breath;Deconditioning;Balance Concerns           6 Minute Walk:  6 Minute Walk    Row Name 06/19/20 1108         6 Minute Walk   Phase Initial     Distance 856 feet     Walk Time 6 minutes     # of Rest Breaks 0     MPH 1.62     METS 1.71     RPE 15     Perceived Dyspnea  3     VO2 Peak 6     Symptoms Yes (comment)     Comments Patient complained of chest tightness that is chronic with exertion and shortness of breath. Pt has had heart cath and echo and everything was fine. Per pt, doctors think chest tightness is from lungs.     Resting HR 71 bpm     Resting BP 132/84     Resting Oxygen Saturation  98 %     Exercise Oxygen Saturation  during 6 min walk 94 %     Max Ex. HR 87 bpm     Max Ex. BP 144/90     2 Minute Post BP 132/90           Interval HR   1  Minute HR 87     2 Minute HR 83     3 Minute HR 83     4 Minute HR 83     5 Minute HR 82     6 Minute HR 84     2 Minute Post HR 75     Interval Heart Rate? Yes           Interval Oxygen   Interval Oxygen? Yes     Baseline Oxygen Saturation % 98 %     1 Minute Oxygen Saturation % 98 %     1 Minute Liters of Oxygen 0 L     2 Minute Oxygen Saturation % 94 %     2 Minute Liters of Oxygen 0 L     3 Minute Oxygen Saturation % 95 %     3 Minute Liters of Oxygen 0 L     4 Minute Oxygen Saturation % 96 %     4 Minute Liters of Oxygen 0 L     5 Minute Oxygen Saturation % 95 %     5 Minute Liters of Oxygen 0 L     6 Minute Oxygen Saturation % 95 %     6 Minute Liters of Oxygen 0 L     2 Minute Post Oxygen Saturation % 99 %     2 Minute Post Liters of Oxygen 0 L            Oxygen Initial Assessment:  Oxygen Initial Assessment -  06/19/20 0951      Home Oxygen   Home Oxygen Device Home Concentrator    Sleep Oxygen Prescription Continuous    Liters per minute 2    Home Exercise Oxygen Prescription None    Home Resting Oxygen Prescription None    Compliance with Home Oxygen Use Yes      Initial 6 min Walk   Oxygen Used None      Program Oxygen Prescription   Program Oxygen Prescription None      Intervention   Short Term Goals To learn and understand importance of monitoring SPO2 with pulse oximeter and demonstrate accurate use of the pulse oximeter.;To learn and understand importance of maintaining oxygen saturations>88%;To learn and demonstrate proper pursed lip breathing techniques or other breathing techniques.;To learn and demonstrate proper use of respiratory medications;To learn and exhibit compliance with exercise, home and travel O2 prescription    Long  Term Goals Exhibits compliance with exercise, home and travel O2 prescription;Verbalizes importance of monitoring SPO2 with pulse oximeter and return demonstration;Maintenance of O2 saturations>88%;Exhibits proper breathing techniques, such as pursed lip breathing or other method taught during program session;Compliance with respiratory medication;Demonstrates proper use of MDI's           Oxygen Re-Evaluation:   Oxygen Discharge (Final Oxygen Re-Evaluation):   Initial Exercise Prescription:   Perform Capillary Blood Glucose checks as needed.  Exercise Prescription Changes:   Exercise Comments:   Exercise Goals and Review:  Exercise Goals    Row Name 06/19/20 1043             Exercise Goals   Increase Physical Activity Yes       Intervention Provide advice, education, support and counseling about physical activity/exercise needs.;Develop an individualized exercise prescription for aerobic and resistive training based on initial evaluation findings, risk stratification, comorbidities and participant's personal goals.       Expected  Outcomes Short Term: Attend rehab on a regular basis to increase  amount of physical activity.;Long Term: Add in home exercise to make exercise part of routine and to increase amount of physical activity.;Long Term: Exercising regularly at least 3-5 days a week.       Increase Strength and Stamina Yes       Intervention Provide advice, education, support and counseling about physical activity/exercise needs.;Develop an individualized exercise prescription for aerobic and resistive training based on initial evaluation findings, risk stratification, comorbidities and participant's personal goals.       Expected Outcomes Short Term: Increase workloads from initial exercise prescription for resistance, speed, and METs.;Short Term: Perform resistance training exercises routinely during rehab and add in resistance training at home;Long Term: Improve cardiorespiratory fitness, muscular endurance and strength as measured by increased METs and functional capacity (6MWT)       Able to understand and use rate of perceived exertion (RPE) scale Yes       Intervention Provide education and explanation on how to use RPE scale       Expected Outcomes Short Term: Able to use RPE daily in rehab to express subjective intensity level;Long Term:  Able to use RPE to guide intensity level when exercising independently       Able to understand and use Dyspnea scale Yes       Intervention Provide education and explanation on how to use Dyspnea scale       Expected Outcomes Short Term: Able to use Dyspnea scale daily in rehab to express subjective sense of shortness of breath during exertion;Long Term: Able to use Dyspnea scale to guide intensity level when exercising independently       Knowledge and understanding of Target Heart Rate Range (THRR) Yes       Intervention Provide education and explanation of THRR including how the numbers were predicted and where they are located for reference       Expected Outcomes Short Term:  Able to state/look up THRR;Long Term: Able to use THRR to govern intensity when exercising independently;Short Term: Able to use daily as guideline for intensity in rehab       Understanding of Exercise Prescription Yes       Intervention Provide education, explanation, and written materials on patient's individual exercise prescription       Expected Outcomes Short Term: Able to explain program exercise prescription;Long Term: Able to explain home exercise prescription to exercise independently              Exercise Goals Re-Evaluation :   Discharge Exercise Prescription (Final Exercise Prescription Changes):   Nutrition:  Target Goals: Understanding of nutrition guidelines, daily intake of sodium 1500mg , cholesterol 200mg , calories 30% from fat and 7% or less from saturated fats, daily to have 5 or more servings of fruits and vegetables.  Biometrics:  Pre Biometrics - 06/19/20 1044      Pre Biometrics   Grip Strength 36 kg            Nutrition Therapy Plan and Nutrition Goals:   Nutrition Assessments:  MEDIFICTS Score Key:  ?70 Need to make dietary changes   40-70 Heart Healthy Diet  ? 40 Therapeutic Level Cholesterol Diet   Picture Your Plate Scores:  <25 Unhealthy dietary pattern with much room for improvement.  41-50 Dietary pattern unlikely to meet recommendations for good health and room for improvement.  51-60 More healthful dietary pattern, with some room for improvement.   >60 Healthy dietary pattern, although there may be some specific behaviors that could be improved.  Nutrition Goals Re-Evaluation:   Nutrition Goals Discharge (Final Nutrition Goals Re-Evaluation):   Psychosocial: Target Goals: Acknowledge presence or absence of significant depression and/or stress, maximize coping skills, provide positive support system. Participant is able to verbalize types and ability to use techniques and skills needed for reducing stress and  depression.  Initial Review & Psychosocial Screening:  Initial Psych Review & Screening - 06/19/20 0953      Initial Review   Current issues with None Identified      Family Dynamics   Good Support System? Yes   Wife is very supportive and takes good care of him     Barriers   Psychosocial barriers to participate in program The patient should benefit from training in stress management and relaxation.      Screening Interventions   Interventions Encouraged to exercise           Quality of Life Scores:  Scores of 19 and below usually indicate a poorer quality of life in these areas.  A difference of  2-3 points is a clinically meaningful difference.  A difference of 2-3 points in the total score of the Quality of Life Index has been associated with significant improvement in overall quality of life, self-image, physical symptoms, and general health in studies assessing change in quality of life.  PHQ-9: Recent Review Flowsheet Data    Depression screen [facility] 2/9 06/19/2020   Decreased Interest 0   Down, Depressed, Hopeless 0   PHQ - 2 Score 0   Altered sleeping 0   Tired, decreased energy 3   Change in appetite 0   Feeling bad or failure about yourself  0   Trouble concentrating 1   Moving slowly or fidgety/restless 0   Suicidal thoughts 0   Difficult doing work/chores Somewhat difficult     Interpretation of Total Score  Total Score Depression Severity:  1-4 = Minimal depression, 5-9 = Mild depression, 10-14 = Moderate depression, 15-19 = Moderately severe depression, 20-27 = Severe depression   Psychosocial Evaluation and Intervention:   Psychosocial Re-Evaluation:   Psychosocial Discharge (Final Psychosocial Re-Evaluation):   Education: Education Goals: Education classes will be provided on a weekly basis, covering required topics. Participant will state understanding/return demonstration of topics presented.  Learning Barriers/Preferences:  Learning  Barriers/Preferences - 06/19/20 0955      Learning Barriers/Preferences   Learning Barriers Hearing   Has (R) and (L) hearing aids   Learning Preferences Skilled Demonstration;Individual Instruction           Education Topics: Risk Factor Reduction:  -Group instruction that is supported by a PowerPoint presentation. Instructor discusses the definition of a risk factor, different risk factors for pulmonary disease, and how the heart and lungs work together.     Nutrition for Pulmonary Patient:  -Group instruction provided by PowerPoint slides, verbal discussion, and written materials to support subject matter. The instructor gives an explanation and review of healthy diet recommendations, which includes a discussion on weight management, recommendations for fruit and vegetable consumption, as well as protein, fluid, caffeine, fiber, sodium, sugar, and alcohol. Tips for eating when patients are short of breath are discussed.   Pursed Lip Breathing:  -Group instruction that is supported by demonstration and informational handouts. Instructor discusses the benefits of pursed lip and diaphragmatic breathing and detailed demonstration on how to preform both.     Oxygen Safety:  -Group instruction provided by PowerPoint, verbal discussion, and written material to support subject matter. There is an overview  of "What is Oxygen" and "Why do we need it".  Instructor also reviews how to create a safe environment for oxygen use, the importance of using oxygen as prescribed, and the risks of noncompliance. There is a brief discussion on traveling with oxygen and resources the patient may utilize.   Oxygen Equipment:  -Group instruction provided by [facility] Staff utilizing handouts, written materials, and equipment demonstrations.   Signs and Symptoms:  -Group instruction provided by written material and verbal discussion to support subject matter. Warning signs and symptoms of infection,  stroke, and heart attack are reviewed and when to call the physician/911 reinforced. Tips for preventing the spread of infection discussed.   Advanced Directives:  -Group instruction provided by verbal instruction and written material to support subject matter. Instructor reviews Advanced Directive laws and proper instruction for filling out document.   Pulmonary Video:  -Group video education that reviews the importance of medication and oxygen compliance, exercise, good nutrition, pulmonary hygiene, and pursed lip and diaphragmatic breathing for the pulmonary patient.   Exercise for the Pulmonary Patient:  -Group instruction that is supported by a PowerPoint presentation. Instructor discusses benefits of exercise, core components of exercise, frequency, duration, and intensity of an exercise routine, importance of utilizing pulse oximetry during exercise, safety while exercising, and options of places to exercise outside of rehab.     Pulmonary Medications:  -Verbally interactive group education provided by instructor with focus on inhaled medications and proper administration.   Anatomy and Physiology of the Respiratory System and Intimacy:  -Group instruction provided by PowerPoint, verbal discussion, and written material to support subject matter. Instructor reviews respiratory cycle and anatomical components of the respiratory system and their functions. Instructor also reviews differences in obstructive and restrictive respiratory diseases with examples of each. Intimacy, Sex, and Sexuality differences are reviewed with a discussion on how relationships can change when diagnosed with pulmonary disease. Common sexual concerns are reviewed.   MD DAY -A group question and answer session with a medical doctor that allows participants to ask questions that relate to their pulmonary disease state.   OTHER EDUCATION -Group or individual verbal, written, or video instructions that support  the educational goals of the pulmonary rehab program.   Holiday Eating Survival Tips:  -Group instruction provided by PowerPoint slides, verbal discussion, and written materials to support subject matter. The instructor gives patients tips, tricks, and techniques to help them not only survive but enjoy the holidays despite the onslaught of food that accompanies the holidays.   Knowledge Questionnaire Score:  Knowledge Questionnaire Score - 06/19/20 1123      Knowledge Questionnaire Score   Pre Score 13/18           Core Components/Risk Factors/Patient Goals at Admission:  Personal Goals and Risk Factors at Admission - 06/19/20 1045      Core Components/Risk Factors/Patient Goals on Admission   Improve shortness of breath with ADL's Yes    Intervention Provide education, individualized exercise plan and daily activity instruction to help decrease symptoms of SOB with activities of daily living.    Expected Outcomes Short Term: Improve cardiorespiratory fitness to achieve a reduction of symptoms when performing ADLs;Long Term: Be able to perform more ADLs without symptoms or delay the onset of symptoms    Hypertension Yes    Intervention Provide education on lifestyle modifcations including regular physical activity/exercise, weight management, moderate sodium restriction and increased consumption of fresh fruit, vegetables, and low fat dairy, alcohol moderation, and smoking cessation.  Expected Outcomes Long Term: Maintenance of blood pressure at goal levels.           Core Components/Risk Factors/Patient Goals Review:    Core Components/Risk Factors/Patient Goals at Discharge (Final Review):    ITP Comments:   Comments:

## 2020-06-23 ENCOUNTER — Encounter (HOSPITAL_COMMUNITY)
Admission: RE | Admit: 2020-06-23 | Discharge: 2020-06-23 | Disposition: A | Discharge status: Home / Self Care | Payer: Medicare Other | Source: Ambulatory Visit | Attending: Pulmonary Disease | Admitting: Pulmonary Disease

## 2020-06-23 ENCOUNTER — Other Ambulatory Visit: Payer: Self-pay

## 2020-06-23 DIAGNOSIS — R0600 Dyspnea, unspecified: Secondary | ICD-10-CM

## 2020-06-23 DIAGNOSIS — I2510 Atherosclerotic heart disease of native coronary artery without angina pectoris: Secondary | ICD-10-CM | POA: Insufficient documentation

## 2020-06-23 NOTE — Progress Notes (Signed)
Pulmonary Individual Treatment Plan  Patient Details  Name: Alex [name]. MRN: [id number] Date of Birth: 28-Mar-1941 Referring Provider:   April Manson Pulmonary Rehab Walk Test from 06/19/2020 in [place]  Referring Provider Dr. Vaughan Browner      Initial Encounter Date:  Flowsheet Row Pulmonary Rehab Walk Test from 06/19/2020 in [place]  Date 06/19/20      Visit Diagnosis: Dyspnea, unspecified type  Patient's Home Medications on Admission:   Current Outpatient Medications:  .  Ascorbic Acid (VITAMIN C) 1000 MG tablet, Take 1,000 mg by mouth 2 (two) times a day. , Disp: , Rfl:  .  aspirin EC 81 MG tablet, Take 81 mg by mouth daily., Disp: , Rfl:  .  atorvastatin (LIPITOR) 40 MG tablet, Take 40 mg by mouth at bedtime. , Disp: , Rfl:  .  b complex vitamins capsule, Take 1 capsule by mouth daily., Disp: , Rfl:  .  Cholecalciferol 25 MCG (1000 UT) tablet, Take 1,000 Units by mouth 2 (two) times a day. , Disp: , Rfl:  .  ezetimibe (ZETIA) 10 MG tablet, Take 10 mg by mouth daily.  (Patient not taking: Reported on 06/19/2020), Disp: , Rfl:  .  fluticasone (FLONASE) 50 MCG/ACT nasal spray, Place 2 sprays into both nostrils daily., Disp: 16 g, Rfl: 5 .  folic acid (FOLVITE) 1 MG tablet, Take 1 mg by mouth at bedtime. , Disp: , Rfl:  .  furosemide (LASIX) 40 MG tablet, Take 40 mg by mouth daily. , Disp: , Rfl:  .  isosorbide mononitrate (IMDUR) 15 mg TB24 24 hr tablet, Take 15 mg by mouth daily., Disp: , Rfl:  .  Multiple Vitamin (MULTI-VITAMINS) TABS, Take 1 tablet by mouth daily., Disp: , Rfl:  .  nitrofurantoin, macrocrystal-monohydrate, (MACROBID) 100 MG capsule, Take 100 mg by mouth 2 (two) times daily., Disp: , Rfl:  .  nitroGLYCERIN (NITROSTAT) 0.4 MG SL tablet, Place 1 tablet (0.4 mg total) under the tongue every 5 (five) minutes as needed for chest pain., Disp: 25 tablet, Rfl: 3 .  pantoprazole (PROTONIX) 40 MG  tablet, Take 40 mg by mouth daily. , Disp: , Rfl:  .  potassium chloride SA (KLOR-CON) 20 MEQ tablet, Take 20 mEq by mouth daily., Disp: , Rfl:  .  tamsulosin (FLOMAX) 0.4 MG CAPS capsule, Take 1 capsule (0.4 mg total) by mouth daily after supper., Disp: 30 capsule, Rfl: 3 .  triamcinolone cream (KENALOG) 0.1 %, Apply 1 application topically 2 (two) times daily as needed., Disp: , Rfl:  .  vitamin B-12 (CYANOCOBALAMIN) 500 MCG tablet, Take 500 mcg by mouth 2 (two) times daily., Disp: , Rfl:   Past Medical History: Past Medical History:  Diagnosis Date  . AL amyloidosis [facility]) oncologist-- dr [name]   Renal and Systemic , dx 04/ 2018;  renal bx 08-05-2016 and bone marrow bx 08-29-2016;  8 month chemotherapy then stell cell transplant 12/ 2018 then chemo again;  since then been on oral chemo daily (11-07-2019 per pt has been on hold until after prostate seed implants)  . Arthritis   . CAD (coronary artery disease) cardiologist-- dr [name];  also followed by cardiologist w/ [facility] in [place], dr [name]   moderate nonobstrucive disease by cath 2011; Lexiscan Myoview 6/14--Normal study, no scar or ischemia, EF 62%;  11-14-2014 nuclear study low risk w/ normal perfusion, nuclear ef 67%  . CKD (chronic kidney disease), stage III ([place])   .  Diverticulosis of colon   . GERD (gastroesophageal reflux disease)   . H/O stem cell transplant ([facility]) 03/2017  . Hiatal hernia   . History of 2019 novel coronavirus disease (COVID-19) 09/2018   [facility], hospital admission 10-28-2019 respiratory failure/ pneumometrostinom/ PE ;  was not intubated , treatment with remdesivir/ steroids/ actemra/ plasma  . History of basal cell carcinoma (BCC)   . History of colon polyps   . History of MRSA infection    culture positive  . History of pulmonary embolus (PE) 10/2018   with covid 19, completed treatment with eliquis  . History of seizures    11-07-2019  per pt last one 1970s, unknown  cause  . History of vertebral compression fracture 1988   lumbar  . Hyperlipidemia   . Hypertension   . Neuropathy due to chemotherapeutic drug (HCC)    fingers tips and feet  . Nocturia   . On supplemental oxygen therapy    post covid 19;   11-07-2019 pt uses O2 every night 2L via [place],  and uses supplements during the day when walking around neighborhood and goes out to appointments/ store that he has to walk a distance  . OSA (obstructive sleep apnea)    study in epic 03-18-2005 mild to moderate   (11-07-2019  per pt used cpap up until when he lost alot of weight, stoped approx. 2012  . Prostate cancer [facility]) urologist--- dr pace/  oncology-- dr Tammi Klippel   dx 03/ 2021  . Pulmonary fibrosis ([place]) 09/2018   pulmonologist-- dr Vaughan Browner;   post covid 19  . SOB (shortness of breath) on exertion    11-07-2019  per pt walks around neighborhood for 30 minutes twice daily, he does have to stop frequently d/t sob and since weather has been hot he uses his supplemental oxygen;  stated he has a pulse ox. at home , when he is sitting down O2 sat 96 -97% on RA when up walking around the house O2 sat 93-94% on RA;  pt stated he carry's his O2 w/ him when he goes to doctor appts/store d/t distance  . Wears glasses   . Wears hearing aid in both ears     Tobacco Use: Social History   Tobacco Use  Smoking Status Former Smoker  . Packs/day: 3.00  . Years: 20.00  . Pack years: 60.00  . Types: Cigarettes  . Quit date: 04/25/1969  . Years since quitting: 9.1  Smokeless Tobacco Former Systems developer  . Types: Chew  . Quit date: 11/07/1983    Labs: Recent Review Flowsheet Data    Labs for ITP Cardiac and Pulmonary Rehab Latest Ref Rng & Units 07/05/2009 07/06/2009 10/29/2018 11/08/2018   Cholestrol 0 - 200 mg/dL - 148 ... - -   LDLCALC 0 - 99 mg/dL - 79 ... - -   HDL >39 mg/dL - 50 - -   Trlycerides <150 mg/dL - 93 103 -   PHART 7.350 - 7.450 - - - 7.528(H)   PCO2ART 32.0 - 48.0 mmHg - - - 27.2(L)   HCO3 20.0 -  28.0 mmol/L - - - 22.7   TCO2 22 - 32 mmol/L 27 - - 24   O2SAT % - - - 90.0      Capillary Blood Glucose: Lab Results  Component Value Date   GLUCAP 113 (H) 11/09/2018   GLUCAP 143 (H) 11/08/2018   GLUCAP 115 (H) 11/08/2018   GLUCAP 153 (H) 11/08/2018  GLUCAP 112 (H) 11/08/2018     Pulmonary Assessment Scores:  Pulmonary Assessment Scores    Row Name 06/19/20 1124         ADL UCSD   ADL Phase Entry     SOB Score total 91           CAT Score   CAT Score 26           mMRC Score   mMRC Score 4           UCSD: Self-administered rating of dyspnea associated with activities of daily living (ADLs) 6-point scale (0 = "not at all" to 5 = "maximal or unable to do because of breathlessness")  Scoring Scores range from 0 to 120.  Minimally important difference is 5 units  CAT: CAT can identify the health impairment of COPD patients and is better correlated with disease progression.  CAT has a scoring range of zero to 40. The CAT score is classified into four groups of low (less than 10), medium (10 - 20), high (21-30) and very high (31-40) based on the impact level of disease on health status. A CAT score over 10 suggests significant symptoms.  A worsening CAT score could be explained by an exacerbation, poor medication adherence, poor inhaler technique, or progression of COPD or comorbid conditions.  CAT MCID is 2 points  mMRC: mMRC (Modified Medical Research Council) Dyspnea Scale is used to assess the degree of baseline functional disability in patients of respiratory disease due to dyspnea. No minimal important difference is established. A decrease in score of 1 point or greater is considered a positive change.   Pulmonary Function Assessment:  Pulmonary Function Assessment - 06/19/20 0952      Breath   Shortness of Breath Limiting activity;Yes           Exercise Target Goals: Exercise Program Goal: Individual exercise prescription set using results from  initial 6 min walk test and THRR while considering  patient's activity barriers and safety.   Exercise Prescription Goal: Initial exercise prescription builds to 30-45 minutes a day of aerobic activity, 2-3 days per week.  Home exercise guidelines will be given to patient during program as part of exercise prescription that the participant will acknowledge.  Activity Barriers & Risk Stratification:  Activity Barriers & Cardiac Risk Stratification - 06/19/20 0949      Activity Barriers & Cardiac Risk Stratification   Activity Barriers Shortness of Breath;Deconditioning;Balance Concerns           6 Minute Walk:  6 Minute Walk    Row Name 06/19/20 1108         6 Minute Walk   Phase Initial     Distance 856 feet     Walk Time 6 minutes     # of Rest Breaks 0     MPH 1.62     METS 1.71     RPE 15     Perceived Dyspnea  3     VO2 Peak 6     Symptoms Yes (comment)     Comments Patient complained of chest tightness that is chronic with exertion and shortness of breath. Pt has had heart cath and echo and everything was fine. Per pt, doctors think chest tightness is from lungs.     Resting HR 71 bpm     Resting BP 132/84     Resting Oxygen Saturation  98 %     Exercise Oxygen Saturation  during 6 min walk  94 %     Max Ex. HR 87 bpm     Max Ex. BP 144/90     2 Minute Post BP 132/90           Interval HR   1 Minute HR 87     2 Minute HR 83     3 Minute HR 83     4 Minute HR 83     5 Minute HR 82     6 Minute HR 84     2 Minute Post HR 75     Interval Heart Rate? Yes           Interval Oxygen   Interval Oxygen? Yes     Baseline Oxygen Saturation % 98 %     1 Minute Oxygen Saturation % 98 %     1 Minute Liters of Oxygen 0 L     2 Minute Oxygen Saturation % 94 %     2 Minute Liters of Oxygen 0 L     3 Minute Oxygen Saturation % 95 %     3 Minute Liters of Oxygen 0 L     4 Minute Oxygen Saturation % 96 %     4 Minute Liters of Oxygen 0 L     5 Minute Oxygen Saturation  % 95 %     5 Minute Liters of Oxygen 0 L     6 Minute Oxygen Saturation % 95 %     6 Minute Liters of Oxygen 0 L     2 Minute Post Oxygen Saturation % 99 %     2 Minute Post Liters of Oxygen 0 L            Oxygen Initial Assessment:  Oxygen Initial Assessment - 06/19/20 0951      Home Oxygen   Home Oxygen Device Home Concentrator    Sleep Oxygen Prescription Continuous    Liters per minute 2    Home Exercise Oxygen Prescription None    Home Resting Oxygen Prescription None    Compliance with Home Oxygen Use Yes      Initial 6 min Walk   Oxygen Used None      Program Oxygen Prescription   Program Oxygen Prescription None      Intervention   Short Term Goals To learn and understand importance of monitoring SPO2 with pulse oximeter and demonstrate accurate use of the pulse oximeter.;To learn and understand importance of maintaining oxygen saturations>88%;To learn and demonstrate proper pursed lip breathing techniques or other breathing techniques.;To learn and demonstrate proper use of respiratory medications;To learn and exhibit compliance with exercise, home and travel O2 prescription    Long  Term Goals Exhibits compliance with exercise, home and travel O2 prescription;Verbalizes importance of monitoring SPO2 with pulse oximeter and return demonstration;Maintenance of O2 saturations>88%;Exhibits proper breathing techniques, such as pursed lip breathing or other method taught during program session;Compliance with respiratory medication;Demonstrates proper use of MDI's           Oxygen Re-Evaluation:   Oxygen Discharge (Final Oxygen Re-Evaluation):   Initial Exercise Prescription:  Initial Exercise Prescription - 06/19/20 1100      Date of Initial Exercise RX and Referring Provider   Date 06/19/20    Referring Provider Dr. Vaughan Browner    Expected Discharge Date 08/20/20      NuStep   Level 1    SPM 75    Minutes 15      Track   Minutes  15      Prescription  Details   Frequency (times per week) 2    Duration Progress to 30 minutes of continuous aerobic without signs/symptoms of physical distress      Intensity   THRR 40-80% of Max Heartrate 57-114    Ratings of Perceived Exertion 11-13    Perceived Dyspnea 0-4      Progression   Progression Continue to progress workloads to maintain intensity without signs/symptoms of physical distress.      Resistance Training   Training Prescription Yes    Weight Blue bands    Reps 10-15           Perform Capillary Blood Glucose checks as needed.  Exercise Prescription Changes:   Exercise Comments:   Exercise Goals and Review:  Exercise Goals    Row Name 06/19/20 1043             Exercise Goals   Increase Physical Activity Yes       Intervention Provide advice, education, support and counseling about physical activity/exercise needs.;Develop an individualized exercise prescription for aerobic and resistive training based on initial evaluation findings, risk stratification, comorbidities and participant's personal goals.       Expected Outcomes Short Term: Attend rehab on a regular basis to increase amount of physical activity.;Long Term: Add in home exercise to make exercise part of routine and to increase amount of physical activity.;Long Term: Exercising regularly at least 3-5 days a week.       Increase Strength and Stamina Yes       Intervention Provide advice, education, support and counseling about physical activity/exercise needs.;Develop an individualized exercise prescription for aerobic and resistive training based on initial evaluation findings, risk stratification, comorbidities and participant's personal goals.       Expected Outcomes Short Term: Increase workloads from initial exercise prescription for resistance, speed, and METs.;Short Term: Perform resistance training exercises routinely during rehab and add in resistance training at home;Long Term: Improve cardiorespiratory  fitness, muscular endurance and strength as measured by increased METs and functional capacity (6MWT)       Able to understand and use rate of perceived exertion (RPE) scale Yes       Intervention Provide education and explanation on how to use RPE scale       Expected Outcomes Short Term: Able to use RPE daily in rehab to express subjective intensity level;Long Term:  Able to use RPE to guide intensity level when exercising independently       Able to understand and use Dyspnea scale Yes       Intervention Provide education and explanation on how to use Dyspnea scale       Expected Outcomes Short Term: Able to use Dyspnea scale daily in rehab to express subjective sense of shortness of breath during exertion;Long Term: Able to use Dyspnea scale to guide intensity level when exercising independently       Knowledge and understanding of Target Heart Rate Range (THRR) Yes       Intervention Provide education and explanation of THRR including how the numbers were predicted and where they are located for reference       Expected Outcomes Short Term: Able to state/look up THRR;Long Term: Able to use THRR to govern intensity when exercising independently;Short Term: Able to use daily as guideline for intensity in rehab       Understanding of Exercise Prescription Yes       Intervention Provide education, explanation, and written materials  on patient's individual exercise prescription       Expected Outcomes Short Term: Able to explain program exercise prescription;Long Term: Able to explain home exercise prescription to exercise independently              Exercise Goals Re-Evaluation :   Discharge Exercise Prescription (Final Exercise Prescription Changes):   Nutrition:  Target Goals: Understanding of nutrition guidelines, daily intake of sodium 1500mg , cholesterol 200mg , calories 30% from fat and 7% or less from saturated fats, daily to have 5 or more servings of fruits and  vegetables.  Biometrics:  Pre Biometrics - 06/19/20 1044      Pre Biometrics   Grip Strength 36 kg            Nutrition Therapy Plan and Nutrition Goals:   Nutrition Assessments:  MEDIFICTS Score Key:  ?70 Need to make dietary changes   40-70 Heart Healthy Diet  ? 40 Therapeutic Level Cholesterol Diet   Picture Your Plate Scores:  <74 Unhealthy dietary pattern with much room for improvement.  41-50 Dietary pattern unlikely to meet recommendations for good health and room for improvement.  51-60 More healthful dietary pattern, with some room for improvement.   >60 Healthy dietary pattern, although there may be some specific behaviors that could be improved.    Nutrition Goals Re-Evaluation:   Nutrition Goals Discharge (Final Nutrition Goals Re-Evaluation):   Psychosocial: Target Goals: Acknowledge presence or absence of significant depression and/or stress, maximize coping skills, provide positive support system. Participant is able to verbalize types and ability to use techniques and skills needed for reducing stress and depression.  Initial Review & Psychosocial Screening:  Initial Psych Review & Screening - 06/19/20 0953      Initial Review   Current issues with None Identified      Family Dynamics   Good Support System? Yes   Wife is very supportive and takes good care of him     Barriers   Psychosocial barriers to participate in program The patient should benefit from training in stress management and relaxation.      Screening Interventions   Interventions Encouraged to exercise           Quality of Life Scores:  Scores of 19 and below usually indicate a poorer quality of life in these areas.  A difference of  2-3 points is a clinically meaningful difference.  A difference of 2-3 points in the total score of the Quality of Life Index has been associated with significant improvement in overall quality of life, self-image, physical symptoms, and  general health in studies assessing change in quality of life.  PHQ-9: Recent Review Flowsheet Data    Depression screen [facility] 2/9 06/19/2020   Decreased Interest 0   Down, Depressed, Hopeless 0   PHQ - 2 Score 0   Altered sleeping 0   Tired, decreased energy 3   Change in appetite 0   Feeling bad or failure about yourself  0   Trouble concentrating 1   Moving slowly or fidgety/restless 0   Suicidal thoughts 0   Difficult doing work/chores Somewhat difficult     Interpretation of Total Score  Total Score Depression Severity:  1-4 = Minimal depression, 5-9 = Mild depression, 10-14 = Moderate depression, 15-19 = Moderately severe depression, 20-27 = Severe depression   Psychosocial Evaluation and Intervention:   Psychosocial Re-Evaluation:  Psychosocial Re-Evaluation    [place] Name 06/22/20 [phone]  Psychosocial Re-Evaluation   Current issues with None Identified       Comments No change in psychosocial assessment since orientation/walk test.  Alex [name] will begin exercising on 06/23/2020.       Expected Outcomes For Alex [name] to be free of psychosocial concerns while particpating in pulmonary rehab.       Interventions Encouraged to attend Pulmonary Rehabilitation for the exercise       Continue Psychosocial Services  No Follow up required              Psychosocial Discharge (Final Psychosocial Re-Evaluation):  Psychosocial Re-Evaluation - 06/22/20 1405      Psychosocial Re-Evaluation   Current issues with None Identified    Comments No change in psychosocial assessment since orientation/walk test.  Alex [name] will begin exercising on 06/23/2020.    Expected Outcomes For Alex [name] to be free of psychosocial concerns while particpating in pulmonary rehab.    Interventions Encouraged to attend Pulmonary Rehabilitation for the exercise    Continue Psychosocial Services  No Follow up required           Education: Education Goals: Education classes will be provided on a  weekly basis, covering required topics. Participant will state understanding/return demonstration of topics presented.  Learning Barriers/Preferences:  Learning Barriers/Preferences - 06/19/20 0955      Learning Barriers/Preferences   Learning Barriers Hearing   Has (R) and (L) hearing aids   Learning Preferences Skilled Demonstration;Individual Instruction           Education Topics: Risk Factor Reduction:  -Group instruction that is supported by a PowerPoint presentation. Instructor discusses the definition of a risk factor, different risk factors for pulmonary disease, and how the heart and lungs work together.     Nutrition for Pulmonary Patient:  -Group instruction provided by PowerPoint slides, verbal discussion, and written materials to support subject matter. The instructor gives an explanation and review of healthy diet recommendations, which includes a discussion on weight management, recommendations for fruit and vegetable consumption, as well as protein, fluid, caffeine, fiber, sodium, sugar, and alcohol. Tips for eating when patients are short of breath are discussed.   Pursed Lip Breathing:  -Group instruction that is supported by demonstration and informational handouts. Instructor discusses the benefits of pursed lip and diaphragmatic breathing and detailed demonstration on how to preform both.     Oxygen Safety:  -Group instruction provided by PowerPoint, verbal discussion, and written material to support subject matter. There is an overview of "What is Oxygen" and "Why do we need it".  Instructor also reviews how to create a safe environment for oxygen use, the importance of using oxygen as prescribed, and the risks of noncompliance. There is a brief discussion on traveling with oxygen and resources the patient may utilize.   Oxygen Equipment:  -Group instruction provided by [facility] Staff utilizing handouts, written materials, and equipment  demonstrations.   Signs and Symptoms:  -Group instruction provided by written material and verbal discussion to support subject matter. Warning signs and symptoms of infection, stroke, and heart attack are reviewed and when to call the physician/911 reinforced. Tips for preventing the spread of infection discussed.   Advanced Directives:  -Group instruction provided by verbal instruction and written material to support subject matter. Instructor reviews Advanced Directive laws and proper instruction for filling out document.   Pulmonary Video:  -Group video education that reviews the importance of medication and oxygen compliance, exercise, good nutrition, pulmonary hygiene, and pursed lip and diaphragmatic breathing for  the pulmonary patient.   Exercise for the Pulmonary Patient:  -Group instruction that is supported by a PowerPoint presentation. Instructor discusses benefits of exercise, core components of exercise, frequency, duration, and intensity of an exercise routine, importance of utilizing pulse oximetry during exercise, safety while exercising, and options of places to exercise outside of rehab.     Pulmonary Medications:  -Verbally interactive group education provided by instructor with focus on inhaled medications and proper administration.   Anatomy and Physiology of the Respiratory System and Intimacy:  -Group instruction provided by PowerPoint, verbal discussion, and written material to support subject matter. Instructor reviews respiratory cycle and anatomical components of the respiratory system and their functions. Instructor also reviews differences in obstructive and restrictive respiratory diseases with examples of each. Intimacy, Sex, and Sexuality differences are reviewed with a discussion on how relationships can change when diagnosed with pulmonary disease. Common sexual concerns are reviewed.   MD DAY -A group question and answer session with a medical doctor  that allows participants to ask questions that relate to their pulmonary disease state.   OTHER EDUCATION -Group or individual verbal, written, or video instructions that support the educational goals of the pulmonary rehab program.   Holiday Eating Survival Tips:  -Group instruction provided by PowerPoint slides, verbal discussion, and written materials to support subject matter. The instructor gives patients tips, tricks, and techniques to help them not only survive but enjoy the holidays despite the onslaught of food that accompanies the holidays.   Knowledge Questionnaire Score:  Knowledge Questionnaire Score - 06/19/20 1123      Knowledge Questionnaire Score   Pre Score 13/18           Core Components/Risk Factors/Patient Goals at Admission:  Personal Goals and Risk Factors at Admission - 06/19/20 1045      Core Components/Risk Factors/Patient Goals on Admission   Improve shortness of breath with ADL's Yes    Intervention Provide education, individualized exercise plan and daily activity instruction to help decrease symptoms of SOB with activities of daily living.    Expected Outcomes Short Term: Improve cardiorespiratory fitness to achieve a reduction of symptoms when performing ADLs;Long Term: Be able to perform more ADLs without symptoms or delay the onset of symptoms    Hypertension Yes    Intervention Provide education on lifestyle modifcations including regular physical activity/exercise, weight management, moderate sodium restriction and increased consumption of fresh fruit, vegetables, and low fat dairy, alcohol moderation, and smoking cessation.    Expected Outcomes Long Term: Maintenance of blood pressure at goal levels.           Core Components/Risk Factors/Patient Goals Review:   Goals and Risk Factor Review    Row Name 06/22/20 1407             Core Components/Risk Factors/Patient Goals Review   Personal Goals Review Develop more efficient breathing  techniques such as purse lipped breathing and diaphragmatic breathing and practicing self-pacing with activity.;Increase knowledge of respiratory medications and ability to use respiratory devices properly.;Improve shortness of breath with ADL's       Review Alex [name] will begin exercising in pulmonary rehab 06/23/2020.       Expected Outcomes See admission goals.              Core Components/Risk Factors/Patient Goals at Discharge (Final Review):   Goals and Risk Factor Review - 06/22/20 1407      Core Components/Risk Factors/Patient Goals Review   Personal Goals Review Develop more efficient  breathing techniques such as purse lipped breathing and diaphragmatic breathing and practicing self-pacing with activity.;Increase knowledge of respiratory medications and ability to use respiratory devices properly.;Improve shortness of breath with ADL's    Review Alex [name] will begin exercising in pulmonary rehab 06/23/2020.    Expected Outcomes See admission goals.           ITP Comments:   Comments: ITP REVIEW Pt is making expected progress toward pulmonary rehab goals after completing 0 sessions. Recommend continued exercise, life style modification, education, and utilization of breathing techniques to increase stamina and strength and decrease shortness of breath with exertion.

## 2020-06-23 NOTE — Progress Notes (Signed)
Daily Session Note  Patient Details  Name: Alex [name]. MRN: [id number] Date of Birth: Oct 09, 1940 Referring Provider:   April Manson Pulmonary Rehab Walk Test from 06/19/2020 in [place]  Referring Provider Dr. Vaughan Browner      Encounter Date: 06/23/2020  Check In:  Session Check In - 06/23/20 1435      Check-In   Supervising physician immediately available to respond to emergencies [facility] immediately available    Physician(s) Dr.Feliz    Location [facility]    Staff Present Rosebud Poles, RN, BSN;Adreena Willits Ysidro Evert, RN;Jessica Hassell Done, MS, ACSM-CEP, Exercise Physiologist    Virtual Visit No    Medication changes reported     No    Fall or balance concerns reported    No    Tobacco Cessation No Change    Warm-up and Cool-down Performed on first and last piece of equipment    Resistance Training Performed Yes    VAD Patient? No    PAD/SET Patient? No      Pain Assessment   Currently in Pain? No/denies    Multiple Pain Sites No           Capillary Blood Glucose: No results found for this or any previous visit (from the past 24 hour(s)).    Social History   Tobacco Use  Smoking Status Former Smoker  . Packs/day: 3.00  . Years: 20.00  . Pack years: 60.00  . Types: Cigarettes  . Quit date: 04/25/1969  . Years since quitting: 72.1  Smokeless Tobacco Former Systems developer  . Types: Chew  . Quit date: 11/07/1983    Goals Met:  Exercise tolerated well No report of cardiac concerns or symptoms Strength training completed today  Goals Unmet:  Not Applicable  Comments: Service time is from 1345 to 1513    Dr. Fransico Him is Medical Director for Cardiac Rehab at [facility].

## 2020-06-25 ENCOUNTER — Other Ambulatory Visit: Payer: Self-pay

## 2020-06-25 ENCOUNTER — Encounter (HOSPITAL_COMMUNITY)
Admission: RE | Admit: 2020-06-25 | Discharge: 2020-06-25 | Disposition: A | Discharge status: Home / Self Care | Payer: Medicare Other | Source: Ambulatory Visit | Attending: Pulmonary Disease | Admitting: Pulmonary Disease

## 2020-06-25 DIAGNOSIS — R0600 Dyspnea, unspecified: Secondary | ICD-10-CM

## 2020-06-25 NOTE — Progress Notes (Signed)
Daily Session Note  Patient Details  Name: Alex [name]. MRN: [id number] Date of Birth: 1940/07/12 Referring Provider:   April Manson Pulmonary Rehab Walk Test from 06/19/2020 in [place]  Referring Provider Dr. Vaughan Browner      Encounter Date: 06/25/2020  Check In:  Session Check In - 06/25/20 1433      Check-In   Supervising physician immediately available to respond to emergencies [facility] immediately available    Physician(s) Dr. Doristine Bosworth    [facility]    Staff Present Rosebud Poles, RN, BSN;Lisa Ysidro Evert, RN;Jessica Hassell Done, MS, ACSM-CEP, Exercise Physiologist    Virtual Visit No    Medication changes reported     No    Fall or balance concerns reported    No    Tobacco Cessation No Change    Warm-up and Cool-down Performed on first and last piece of equipment    Resistance Training Performed No    VAD Patient? No    PAD/SET Patient? No      Pain Assessment   Currently in Pain? No/denies    Multiple Pain Sites No           Capillary Blood Glucose: No results found for this or any previous visit (from the past 24 hour(s)).    Social History   Tobacco Use  Smoking Status Former Smoker  . Packs/day: 3.00  . Years: 20.00  . Pack years: 60.00  . Types: Cigarettes  . Quit date: 04/25/1969  . Years since quitting: 51.2  Smokeless Tobacco Former Systems developer  . Types: Chew  . Quit date: 11/07/1983    Goals Met:  Proper associated with RPD/PD & O2 Sat Exercise tolerated well Strength training completed today  Goals Unmet:  Not Applicable  Comments: Service time is from 1345 to 1500.    Dr. Fransico Him is Medical Director for Cardiac Rehab at [facility].

## 2020-06-30 ENCOUNTER — Other Ambulatory Visit: Payer: Self-pay

## 2020-06-30 ENCOUNTER — Encounter (HOSPITAL_COMMUNITY)
Admission: RE | Admit: 2020-06-30 | Discharge: 2020-06-30 | Disposition: A | Discharge status: Home / Self Care | Payer: Medicare Other | Source: Ambulatory Visit | Attending: Pulmonary Disease | Admitting: Pulmonary Disease

## 2020-06-30 VITALS — Wt 201.3 lb

## 2020-06-30 DIAGNOSIS — R0600 Dyspnea, unspecified: Secondary | ICD-10-CM | POA: Diagnosis not present

## 2020-06-30 NOTE — Progress Notes (Signed)
Daily Session Note  Patient Details  Name: Alex [name]. MRN: [id number] Date of Birth: [birth year] Referring Provider:   April Manson Pulmonary Rehab Walk Test from 06/19/2020 in [place]  Referring Provider Dr. Vaughan Browner      Encounter Date: 06/30/2020  Check In:  Session Check In - 06/30/20 1440      Check-In   Supervising physician immediately available to respond to emergencies [facility] immediately available    Physician(s) Dr. Doristine Bosworth    [facility]    Staff Present Rosebud Poles, RN, BSN;Honi Name Ysidro Evert, RN;Jessica Hassell Done, MS, ACSM-CEP, Exercise Physiologist    Virtual Visit No    Medication changes reported     No    Fall or balance concerns reported    No    Tobacco Cessation No Change    Warm-up and Cool-down Performed on first and last piece of equipment    Resistance Training Performed Yes    VAD Patient? No    PAD/SET Patient? No      Pain Assessment   Currently in Pain? No/denies    Multiple Pain Sites No           Capillary Blood Glucose: No results found for this or any previous visit (from the past 24 hour(s)).   Exercise Prescription Changes - 06/30/20 1500      Response to Exercise   Blood Pressure (Admit) 120/70    Blood Pressure (Exercise) 130/80    Blood Pressure (Exit) 112/74    Heart Rate (Admit) 76 bpm    Heart Rate (Exercise) 83 bpm    Heart Rate (Exit) 84 bpm    Oxygen Saturation (Admit) 96 %    Oxygen Saturation (Exercise) 92 %    Oxygen Saturation (Exit) 97 %    Rating of Perceived Exertion (Exercise) 11    Perceived Dyspnea (Exercise) 2    Duration Progress to 30 minutes of  aerobic without signs/symptoms of physical distress    Intensity Other (comment)   40-80% of HRR     Progression   Progression Continue to progress workloads to maintain intensity without signs/symptoms of physical distress.      Resistance Training   Training Prescription Yes    Weight  blue bands    Reps 10-15    Time 10 Minutes      NuStep   Level 3    SPM 80    Minutes 15    METs 2.2      Track   Laps 15    Minutes 15           Social History   Tobacco Use  Smoking Status Former Smoker  . Packs/day: 3.00  . Years: 20.00  . Pack years: 60.00  . Types: Cigarettes  . Quit date: 04/25/1969  . Years since quitting: 51.2  Smokeless Tobacco Former Systems developer  . Types: Chew  . Quit date: 11/07/1983    Goals Met:  Exercise tolerated well No report of cardiac concerns or symptoms Strength training completed today  Goals Unmet:  Not Applicable  Comments: Service time is from 1350 to 1515    Dr. Fransico Him is Medical Director for Cardiac Rehab at [facility].

## 2020-07-02 ENCOUNTER — Encounter (HOSPITAL_COMMUNITY)
Admission: RE | Admit: 2020-07-02 | Discharge: 2020-07-02 | Disposition: A | Discharge status: Home / Self Care | Payer: Medicare Other | Source: Ambulatory Visit | Attending: Pulmonary Disease | Admitting: Pulmonary Disease

## 2020-07-02 ENCOUNTER — Other Ambulatory Visit: Payer: Self-pay

## 2020-07-02 DIAGNOSIS — R0600 Dyspnea, unspecified: Secondary | ICD-10-CM

## 2020-07-02 NOTE — Progress Notes (Signed)
Daily Session Note  Patient Details  Name: Alex [name]. MRN: [id number] Date of Birth: [birth year] Referring Provider:   April Manson Pulmonary Rehab Walk Test from 06/19/2020 in [place]  Referring Provider Dr. Vaughan Browner      Encounter Date: 07/02/2020  Check In:  Session Check In - 07/02/20 1405      Check-In   Supervising physician immediately available to respond to emergencies [facility] immediately available    Physician(s) Dr. Arbutus Ped    [facility]    Staff Present Rosebud Poles, RN, BSN;Seynabou Fults Ysidro Evert, RN;David [place], MS, EP-C, CCRP    Virtual Visit No    Medication changes reported     No    Fall or balance concerns reported    No    Tobacco Cessation No Change    Warm-up and Cool-down Performed on first and last piece of equipment    Resistance Training Performed Yes    VAD Patient? No    PAD/SET Patient? No      Pain Assessment   Currently in Pain? No/denies    Multiple Pain Sites No           Capillary Blood Glucose: No results found for this or any previous visit (from the past 24 hour(s)).    Social History   Tobacco Use  Smoking Status Former Smoker  . Packs/day: 3.00  . Years: 20.00  . Pack years: 60.00  . Types: Cigarettes  . Quit date: 04/25/1969  . Years since quitting: 51.2  Smokeless Tobacco Former Systems developer  . Types: Chew  . Quit date: 11/07/1983    Goals Met:  Exercise tolerated well No report of cardiac concerns or symptoms Strength training completed today  Goals Unmet:  Not Applicable  Comments: Service time is from 1345 to 1500    Dr. Fransico Him is Medical Director for Cardiac Rehab at [facility].

## 2020-07-07 ENCOUNTER — Other Ambulatory Visit: Payer: Self-pay

## 2020-07-07 ENCOUNTER — Encounter (HOSPITAL_COMMUNITY)
Admission: RE | Admit: 2020-07-07 | Discharge: 2020-07-07 | Disposition: A | Discharge status: Home / Self Care | Payer: Medicare Other | Source: Ambulatory Visit | Attending: Pulmonary Disease | Admitting: Pulmonary Disease

## 2020-07-07 DIAGNOSIS — R0600 Dyspnea, unspecified: Secondary | ICD-10-CM | POA: Diagnosis not present

## 2020-07-07 NOTE — Progress Notes (Signed)
Daily Session Note  Patient Details  Name: Alex [name]. MRN: [id number] Date of Birth: [birth year] Referring Provider:   April Manson Pulmonary Rehab Walk Test from 06/19/2020 in [place]  Referring Provider Dr. Vaughan Browner      Encounter Date: 07/07/2020  Check In:  Session Check In - 07/07/20 1507      Check-In   Supervising physician immediately available to respond to emergencies [facility] immediately available    Physician(s) Dr. Arbutus Ped    [facility]    Staff Present Rosebud Poles, RN, BSN;Carlette Wilber Oliphant, RN, Isaac Laud, MS, ACSM-CEP, Exercise Physiologist;Olinty Celesta Aver, MS, ACSM CEP, Exercise Physiologist    Virtual Visit No    Medication changes reported     No    Fall or balance concerns reported    No    Tobacco Cessation No Change    Warm-up and Cool-down Performed on first and last piece of equipment    Resistance Training Performed Yes    VAD Patient? No    PAD/SET Patient? No      Pain Assessment   Currently in Pain? No/denies    Pain Score 0-No pain    Multiple Pain Sites No           Capillary Blood Glucose: No results found for this or any previous visit (from the past 24 hour(s)).    Social History   Tobacco Use  Smoking Status Former Smoker  . Packs/day: 3.00  . Years: 20.00  . Pack years: 60.00  . Types: Cigarettes  . Quit date: 04/25/1969  . Years since quitting: 51.2  Smokeless Tobacco Former Systems developer  . Types: Chew  . Quit date: 11/07/1983    Goals Met:  Proper associated with RPD/PD & O2 Sat Exercise tolerated well Strength training completed today  Goals Unmet:  Not Applicable  Comments: Service time is from 1345 to 1450.    Dr. Fransico Him is Medical Director for Cardiac Rehab at [facility].

## 2020-07-09 ENCOUNTER — Encounter (HOSPITAL_COMMUNITY)
Admission: RE | Admit: 2020-07-09 | Discharge: 2020-07-09 | Disposition: A | Discharge status: Home / Self Care | Payer: Medicare Other | Source: Ambulatory Visit | Attending: Pulmonary Disease | Admitting: Pulmonary Disease

## 2020-07-09 ENCOUNTER — Other Ambulatory Visit: Payer: Self-pay

## 2020-07-09 VITALS — Wt 201.0 lb

## 2020-07-09 DIAGNOSIS — R0600 Dyspnea, unspecified: Secondary | ICD-10-CM

## 2020-07-09 NOTE — Progress Notes (Signed)
Daily Session Note  Patient Details  Name: Alex [name]. MRN: [id number] Date of Birth: Apr 20, 1941 Referring Provider:   April Manson Pulmonary Rehab Walk Test from 06/19/2020 in [place]  Referring Provider Dr. Vaughan Browner      Encounter Date: 07/09/2020  Check In:  Session Check In - 07/09/20 1355      Check-In   Supervising physician immediately available to respond to emergencies [facility] immediately available    Physician(s) Dr. Florencia Reasons    [facility]    Staff Present Rosebud Poles, RN, Milus Glazier, MS, EP-C, CCRP;Jessica Hassell Done, MS, ACSM-CEP, Exercise Physiologist    Virtual Visit No    Medication changes reported     No    Fall or balance concerns reported    No    Tobacco Cessation No Change    Warm-up and Cool-down Performed on first and last piece of equipment    Resistance Training Performed Yes    VAD Patient? No    PAD/SET Patient? No      Pain Assessment   Currently in Pain? No/denies    Multiple Pain Sites No           Capillary Blood Glucose: No results found for this or any previous visit (from the past 24 hour(s)).    Social History   Tobacco Use  Smoking Status Former Smoker  . Packs/day: 3.00  . Years: 20.00  . Pack years: 60.00  . Types: Cigarettes  . Quit date: 04/25/1969  . Years since quitting: 51.2  Smokeless Tobacco Former Systems developer  . Types: Chew  . Quit date: 11/07/1983    Goals Met:  Proper associated with RPD/PD & O2 Sat Exercise tolerated well No report of cardiac concerns or symptoms Strength training completed today  Goals Unmet:  Not Applicable  Comments: Service time is from 1335 to 1455    Dr. Fransico Him is Medical Director for Cardiac Rehab at [facility]

## 2020-07-09 NOTE — Progress Notes (Signed)
Alex [name]. 80 y.o. male Nutrition Note  Diagnosis: Post Covid 19 pneumonia   Past Medical History:  Diagnosis Date  . 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 Arthritis   . CAD (coronary artery disease) cardiologist-- dr [name];  also followed by cardiologist w/ [facility] in [place], dr [name]   moderate nonobstrucive disease by cath 2011; Lexiscan Myoview 6/14--Normal study, no scar or ischemia, EF 62%;  11-14-2014 nuclear study low risk w/ normal perfusion, nuclear ef 67%  . CKD (chronic kidney disease), stage III ([place])   . Diverticulosis of colon   . GERD (gastroesophageal reflux disease)   . H/O stem cell transplant ([place]) 03/2017  . Hiatal hernia   . History of 2019 novel coronavirus disease (COVID-19) 09/2018   [facility], hospital admission 10-28-2019 respiratory failure/ pneumometrostinom/ PE ;  was not intubated , treatment with remdesivir/ steroids/ actemra/ plasma  . History of basal cell carcinoma (BCC)   . History of colon polyps   . History of MRSA infection    culture positive  . History of pulmonary embolus (PE) 10/2018   with covid 19, completed treatment with eliquis  . History of seizures    11-07-2019  per pt last one 1970s, unknown cause  . History of vertebral compression fracture 1988   lumbar  . Hyperlipidemia   . Hypertension   . Neuropathy due to chemotherapeutic drug (HCC)    fingers tips and feet  . Nocturia   . On supplemental oxygen therapy    post covid 19;   11-07-2019 pt uses O2 every night 2L via [place],  and uses supplements during the day when walking around neighborhood and goes out to appointments/ store that he has to walk a distance  .  OSA (obstructive sleep apnea)    study in epic 03-18-2005 mild to moderate   (11-07-2019  per pt used cpap up until when he lost alot of weight, stoped approx. 2012  . Prostate cancer [facility]) urologist--- dr pace/  oncology-- dr Tammi Klippel   dx 03/ 2021  . Pulmonary fibrosis ([place]) 09/2018   pulmonologist-- dr Vaughan Browner;   post covid 19  . SOB (shortness of breath) on exertion    11-07-2019  per pt walks around neighborhood for 30 minutes twice daily, he does have to stop frequently d/t sob and since weather has been hot he uses his supplemental oxygen;  stated he has a pulse ox. at home , when he is sitting down O2 sat 96 -97% on RA when up walking around the house O2 sat 93-94% on RA;  pt stated he carry's his O2 w/ him when he goes to doctor appts/store d/t distance  . Wears glasses   . Wears hearing aid in both ears      Medications reviewed.   Current Outpatient Medications:  .  Ascorbic Acid (VITAMIN C) 1000 MG tablet, Take 1,000 mg by mouth 2 (two) times a day. , Disp: , Rfl:  .  aspirin EC 81 MG tablet, Take 81 mg by mouth daily., Disp: , Rfl:  .  atorvastatin (LIPITOR) 40 MG tablet, Take 40 mg by mouth at bedtime. , Disp: ,  Rfl:  .  b complex vitamins capsule, Take 1 capsule by mouth daily., Disp: , Rfl:  .  Cholecalciferol 25 MCG (1000 UT) tablet, Take 1,000 Units by mouth 2 (two) times a day. , Disp: , Rfl:  .  ezetimibe (ZETIA) 10 MG tablet, Take 10 mg by mouth daily.  (Patient not taking: Reported on 06/19/2020), Disp: , Rfl:  .  fluticasone (FLONASE) 50 MCG/ACT nasal spray, Place 2 sprays into both nostrils daily., Disp: 16 g, Rfl: 5 .  folic acid (FOLVITE) 1 MG tablet, Take 1 mg by mouth at bedtime. , Disp: , Rfl:  .  furosemide (LASIX) 40 MG tablet, Take 40 mg by mouth daily. , Disp: , Rfl:  .  isosorbide mononitrate (IMDUR) 15 mg TB24 24 hr tablet, Take 15 mg by mouth daily., Disp: , Rfl:  .  Multiple Vitamin (MULTI-VITAMINS) TABS, Take 1 tablet by mouth daily., Disp: , Rfl:  .   nitrofurantoin, macrocrystal-monohydrate, (MACROBID) 100 MG capsule, Take 100 mg by mouth 2 (two) times daily., Disp: , Rfl:  .  nitroGLYCERIN (NITROSTAT) 0.4 MG SL tablet, Place 1 tablet (0.4 mg total) under the tongue every 5 (five) minutes as needed for chest pain., Disp: 25 tablet, Rfl: 3 .  pantoprazole (PROTONIX) 40 MG tablet, Take 40 mg by mouth daily. , Disp: , Rfl:  .  potassium chloride SA (KLOR-CON) 20 MEQ tablet, Take 20 mEq by mouth daily., Disp: , Rfl:  .  tamsulosin (FLOMAX) 0.4 MG CAPS capsule, Take 1 capsule (0.4 mg total) by mouth daily after supper., Disp: 30 capsule, Rfl: 3 .  triamcinolone cream (KENALOG) 0.1 %, Apply 1 application topically 2 (two) times daily as needed., Disp: , Rfl:  .  vitamin B-12 (CYANOCOBALAMIN) 500 MCG tablet, Take 500 mcg by mouth 2 (two) times daily., Disp: , Rfl:    Ht Readings from Last 1 Encounters:  06/19/20 5\' 11"  (1.803 m)     Wt Readings from Last 3 Encounters:  06/30/20 201 lb 4.5 oz (91.3 kg)  06/19/20 203 lb 0.7 oz (92.1 kg)  04/13/20 200 lb 9.6 oz (91 kg)     There is no height or weight on file to calculate BMI.   Social History   Tobacco Use  Smoking Status Former Smoker  . Packs/day: 3.00  . Years: 20.00  . Pack years: 60.00  . Types: Cigarettes  . Quit date: 04/25/1969  . Years since quitting: 51.2  Smokeless Tobacco Former Systems developer  . Types: Chew  . Quit date: 11/07/1983      Nutrition Note  Spoke with pt. Nutrition Plan and Nutrition Survey goals reviewed with pt.  Per survey, pt is following a generally healthy diet pattern.  Pt has Pre-diabetes. Last A1c indicates blood glucose well-controlled. Pt would like to lose 10 lbs but is not sure he is motivated for this now. He is walking 30 minutes 3 times per day. He eats 2 meals per day. Typically light meals. He snacks about once per week. He eats out about 5 times per week. Diet recall: Breakfast:1/2 bowl of honey nut cheerios and milk, banana Lunch:  none Dinner: Homemade tuna salad and saltine crackers Fluids: water mostly  Pt expressed understanding of the information reviewed.    Nutrition Diagnosis ? Food-and nutrition-related knowledge deficit related to lack of exposure to information as related to diagnosis of: ? CVD ? Pre-diabetes  Nutrition Intervention ? Pt's individual nutrition plan reviewed with pt. ? Benefits of adopting healthy diet reviewed  with Rate My Plate survey ? Continue client-centered nutrition education by RD, as part of interdisciplinary care.  Goal(s) ? Pt to build a healthy plate including vegetables, fruits, whole grains, and low-fat dairy products in a heart healthy meal plan.   Plan:   Will provide client-centered nutrition education as part of interdisciplinary care  Monitor and evaluate progress toward nutrition goal with team.   Michaele Offer, MS, RDN, LDN

## 2020-07-14 ENCOUNTER — Encounter (HOSPITAL_COMMUNITY)
Admission: RE | Admit: 2020-07-14 | Discharge: 2020-07-14 | Disposition: A | Discharge status: Home / Self Care | Payer: Medicare Other | Source: Ambulatory Visit | Attending: Pulmonary Disease | Admitting: Pulmonary Disease

## 2020-07-14 ENCOUNTER — Other Ambulatory Visit: Payer: Self-pay

## 2020-07-14 VITALS — Wt 202.2 lb

## 2020-07-14 DIAGNOSIS — R0600 Dyspnea, unspecified: Secondary | ICD-10-CM | POA: Diagnosis not present

## 2020-07-14 NOTE — Progress Notes (Signed)
Daily Session Note  Patient Details  Name: Alex [name]. MRN: [id number] Date of Birth: 01/26/1941 Referring Provider:   April Manson Pulmonary Rehab Walk Test from 06/19/2020 in [place]  Referring Provider Dr. Vaughan Browner      Encounter Date: 07/14/2020  Check In:  Session Check In - 07/14/20 1432      Check-In   Supervising physician immediately available to respond to emergencies [facility] immediately available    Physician(s) Dr. Tawanna Solo    [facility]    Staff Present Rosebud Poles, RN, Isaac Laud, MS, ACSM-CEP, Exercise Physiologist;Lisa Ysidro Evert, RN    Virtual Visit No    Medication changes reported     No    Fall or balance concerns reported    No    Tobacco Cessation No Change    Warm-up and Cool-down Performed on first and last piece of equipment    Resistance Training Performed Yes    VAD Patient? No    PAD/SET Patient? No      Pain Assessment   Currently in Pain? No/denies    Multiple Pain Sites No           Capillary Blood Glucose: No results found for this or any previous visit (from the past 24 hour(s)).   Exercise Prescription Changes - 07/14/20 1500      Response to Exercise   Blood Pressure (Admit) 132/78    Blood Pressure (Exercise) 144/82    Blood Pressure (Exit) 136/80    Heart Rate (Admit) 77 bpm    Heart Rate (Exercise) 86 bpm    Heart Rate (Exit) 77 bpm    Oxygen Saturation (Admit) 94 %    Oxygen Saturation (Exercise) 94 %    Oxygen Saturation (Exit) 95 %    Rating of Perceived Exertion (Exercise) 9    Perceived Dyspnea (Exercise) 1    Duration Continue with 30 min of aerobic exercise without signs/symptoms of physical distress.    Intensity THRR unchanged      Progression   Progression Continue to progress workloads to maintain intensity without signs/symptoms of physical distress.      Resistance Training   Training Prescription Yes    Weight blue bands     Reps 10-15    Time 10 Minutes      NuStep   Level 4    SPM 80    Minutes 15    METs 2.4      Track   Laps 16    Minutes 15    METs 2.86           Social History   Tobacco Use  Smoking Status Former Smoker  . Packs/day: 3.00  . Years: 20.00  . Pack years: 60.00  . Types: Cigarettes  . Quit date: 04/25/1969  . Years since quitting: 51.2  Smokeless Tobacco Former Systems developer  . Types: Chew  . Quit date: 11/07/1983    Goals Met:  Proper associated with RPD/PD & O2 Sat Exercise tolerated well No report of cardiac concerns or symptoms Strength training completed today  Goals Unmet:  Not Applicable  Comments: Service time is from 1300 to 1415    Dr. Fransico Him is Medical Director for Cardiac Rehab at [facility].

## 2020-07-16 ENCOUNTER — Other Ambulatory Visit: Payer: Self-pay | Admitting: Internal Medicine

## 2020-07-16 ENCOUNTER — Encounter (HOSPITAL_COMMUNITY)
Admission: RE | Admit: 2020-07-16 | Discharge: 2020-07-16 | Disposition: A | Discharge status: Home / Self Care | Payer: Medicare Other | Source: Ambulatory Visit | Attending: Pulmonary Disease | Admitting: Pulmonary Disease

## 2020-07-16 ENCOUNTER — Other Ambulatory Visit: Payer: Self-pay

## 2020-07-16 DIAGNOSIS — R0600 Dyspnea, unspecified: Secondary | ICD-10-CM

## 2020-07-16 DIAGNOSIS — I714 Abdominal aortic aneurysm, without rupture, unspecified: Secondary | ICD-10-CM

## 2020-07-16 NOTE — Progress Notes (Signed)
Daily Session Note  Patient Details  Name: Alex [name]. MRN: [id number] Date of Birth: 06/17/1940 Referring Provider:   April Manson Pulmonary Rehab Walk Test from 06/19/2020 in [place]  Referring Provider Dr. Vaughan Browner      Encounter Date: 07/16/2020  Check In:  Session Check In - 07/16/20 1427      Check-In   Supervising physician immediately available to respond to emergencies [facility] immediately available    Physician(s) Dr. Tawanna Solo    Location [facility]    Staff Present Rosebud Poles, RN, BSN;Jumana Paccione Ysidro Evert, RN;Jessica Hassell Done, MS, ACSM-CEP, Exercise Physiologist    Virtual Visit No    Medication changes reported     No    Fall or balance concerns reported    No    Tobacco Cessation No Change    Warm-up and Cool-down Performed on first and last piece of equipment    Resistance Training Performed No    VAD Patient? No    PAD/SET Patient? No      Pain Assessment   Currently in Pain? No/denies    Multiple Pain Sites No           Capillary Blood Glucose: No results found for this or any previous visit (from the past 24 hour(s)).    Social History   Tobacco Use  Smoking Status Former Smoker  . Packs/day: 3.00  . Years: 20.00  . Pack years: 60.00  . Types: Cigarettes  . Quit date: 04/25/1969  . Years since quitting: 51.2  Smokeless Tobacco Former Systems developer  . Types: Chew  . Quit date: 11/07/1983    Goals Met:  Exercise tolerated well No report of cardiac concerns or symptoms Strength training completed today  Goals Unmet:  Not Applicable  Comments: Service time is from 1400 to 1500    Dr. Fransico Him is Medical Director for Cardiac Rehab at [facility].

## 2020-07-21 ENCOUNTER — Other Ambulatory Visit: Payer: Self-pay

## 2020-07-21 ENCOUNTER — Encounter (HOSPITAL_COMMUNITY)
Admission: RE | Admit: 2020-07-21 | Discharge: 2020-07-21 | Disposition: A | Discharge status: Home / Self Care | Payer: Medicare Other | Source: Ambulatory Visit | Attending: Pulmonary Disease | Admitting: Pulmonary Disease

## 2020-07-21 DIAGNOSIS — R0600 Dyspnea, unspecified: Secondary | ICD-10-CM | POA: Diagnosis not present

## 2020-07-21 NOTE — Progress Notes (Signed)
Pulmonary Individual Treatment Plan  Patient Details  Name: Alex [name]. MRN: [id number] Date of Birth: 10/12/1940 Referring Provider:   April Manson Pulmonary Rehab Walk Test from 06/19/2020 in [place]  Referring Provider Dr. Vaughan Browner      Initial Encounter Date:  Flowsheet Row Pulmonary Rehab Walk Test from 06/19/2020 in [place]  Date 06/19/20      Visit Diagnosis: No diagnosis found.  Patient's Home Medications on Admission:   Current Outpatient Medications:  .  Ascorbic Acid (VITAMIN C) 1000 MG tablet, Take 1,000 mg by mouth 2 (two) times a day. , Disp: , Rfl:  .  aspirin EC 81 MG tablet, Take 81 mg by mouth daily., Disp: , Rfl:  .  atorvastatin (LIPITOR) 40 MG tablet, Take 40 mg by mouth at bedtime. , Disp: , Rfl:  .  b complex vitamins capsule, Take 1 capsule by mouth daily., Disp: , Rfl:  .  Cholecalciferol 25 MCG (1000 UT) tablet, Take 1,000 Units by mouth 2 (two) times a day. , Disp: , Rfl:  .  ezetimibe (ZETIA) 10 MG tablet, Take 10 mg by mouth daily.  (Patient not taking: Reported on 06/19/2020), Disp: , Rfl:  .  fluticasone (FLONASE) 50 MCG/ACT nasal spray, Place 2 sprays into both nostrils daily., Disp: 16 g, Rfl: 5 .  folic acid (FOLVITE) 1 MG tablet, Take 1 mg by mouth at bedtime. , Disp: , Rfl:  .  furosemide (LASIX) 40 MG tablet, Take 40 mg by mouth daily. , Disp: , Rfl:  .  isosorbide mononitrate (IMDUR) 15 mg TB24 24 hr tablet, Take 15 mg by mouth daily., Disp: , Rfl:  .  Multiple Vitamin (MULTI-VITAMINS) TABS, Take 1 tablet by mouth daily., Disp: , Rfl:  .  nitrofurantoin, macrocrystal-monohydrate, (MACROBID) 100 MG capsule, Take 100 mg by mouth 2 (two) times daily., Disp: , Rfl:  .  nitroGLYCERIN (NITROSTAT) 0.4 MG SL tablet, Place 1 tablet (0.4 mg total) under the tongue every 5 (five) minutes as needed for chest pain., Disp: 25 tablet, Rfl: 3 .  pantoprazole (PROTONIX) 40 MG tablet,  Take 40 mg by mouth daily. , Disp: , Rfl:  .  potassium chloride SA (KLOR-CON) 20 MEQ tablet, Take 20 mEq by mouth daily., Disp: , Rfl:  .  tamsulosin (FLOMAX) 0.4 MG CAPS capsule, Take 1 capsule (0.4 mg total) by mouth daily after supper., Disp: 30 capsule, Rfl: 3 .  triamcinolone cream (KENALOG) 0.1 %, Apply 1 application topically 2 (two) times daily as needed., Disp: , Rfl:  .  vitamin B-12 (CYANOCOBALAMIN) 500 MCG tablet, Take 500 mcg by mouth 2 (two) times daily., Disp: , Rfl:   Past Medical History: Past Medical History:  Diagnosis Date  . AL amyloidosis [facility]) oncologist-- dr [name]   Renal and Systemic , dx 04/ 2018;  renal bx 08-05-2016 and bone marrow bx 08-29-2016;  8 month chemotherapy then stell cell transplant 12/ 2018 then chemo again;  since then been on oral chemo daily (11-07-2019 per pt has been on hold until after prostate seed implants)  . Arthritis   . CAD (coronary artery disease) cardiologist-- dr [name];  also followed by cardiologist w/ [facility] in [place], dr [name]   moderate nonobstrucive disease by cath 2011; Lexiscan Myoview 6/14--Normal study, no scar or ischemia, EF 62%;  11-14-2014 nuclear study low risk w/ normal perfusion, nuclear ef 67%  . CKD (chronic kidney disease), stage III ([place])   .  Diverticulosis of colon   . GERD (gastroesophageal reflux disease)   . H/O stem cell transplant ([place]) 03/2017  . Hiatal hernia   . History of 2019 novel coronavirus disease (COVID-19) 09/2018   [facility], hospital admission 10-28-2019 respiratory failure/ pneumometrostinom/ PE ;  was not intubated , treatment with remdesivir/ steroids/ actemra/ plasma  . History of basal cell carcinoma (BCC)   . History of colon polyps   . History of MRSA infection    culture positive  . History of pulmonary embolus (PE) 10/2018   with covid 19, completed treatment with eliquis  . History of seizures    11-07-2019  per pt last one 1970s, unknown cause  .  History of vertebral compression fracture 1988   lumbar  . Hyperlipidemia   . Hypertension   . Neuropathy due to chemotherapeutic drug (HCC)    fingers tips and feet  . Nocturia   . On supplemental oxygen therapy    post covid 19;   11-07-2019 pt uses O2 every night 2L via [place],  and uses supplements during the day when walking around neighborhood and goes out to appointments/ store that he has to walk a distance  . OSA (obstructive sleep apnea)    study in epic 03-18-2005 mild to moderate   (11-07-2019  per pt used cpap up until when he lost alot of weight, stoped approx. 2012  . Prostate cancer [facility]) urologist--- dr pace/  oncology-- dr Tammi Klippel   dx 03/ 2021  . Pulmonary fibrosis ([place]) 09/2018   pulmonologist-- dr Vaughan Browner;   post covid 19  . SOB (shortness of breath) on exertion    11-07-2019  per pt walks around neighborhood for 30 minutes twice daily, he does have to stop frequently d/t sob and since weather has been hot he uses his supplemental oxygen;  stated he has a pulse ox. at home , when he is sitting down O2 sat 96 -97% on RA when up walking around the house O2 sat 93-94% on RA;  pt stated he carry's his O2 w/ him when he goes to doctor appts/store d/t distance  . Wears glasses   . Wears hearing aid in both ears     Tobacco Use: Social History   Tobacco Use  Smoking Status Former Smoker  . Packs/day: 3.00  . Years: 20.00  . Pack years: 60.00  . Types: Cigarettes  . Quit date: 04/25/1969  . Years since quitting: 51.2  Smokeless Tobacco Former Systems developer  . Types: Chew  . Quit date: 11/07/1983    Labs: Recent Review Flowsheet Data    Labs for ITP Cardiac and Pulmonary Rehab Latest Ref Rng & Units 07/05/2009 07/06/2009 10/29/2018 11/08/2018   Cholestrol 0 - 200 mg/dL - 148 ... - -   LDLCALC 0 - 99 mg/dL - 79 ... - -   HDL >39 mg/dL - 50 - -   Trlycerides <150 mg/dL - 93 103 -   PHART 7.350 - 7.450 - - - 7.528(H)   PCO2ART 32.0 - 48.0 mmHg - - - 27.2(L)   HCO3 20.0 - 28.0 mmol/L  - - - 22.7   TCO2 22 - 32 mmol/L 27 - - 24   O2SAT % - - - 90.0      Capillary Blood Glucose: Lab Results  Component Value Date   GLUCAP 113 (H) 11/09/2018   GLUCAP 143 (H) 11/08/2018   GLUCAP 115 (H) 11/08/2018   GLUCAP 153 (H) 11/08/2018  GLUCAP 112 (H) 11/08/2018     Pulmonary Assessment Scores:  Pulmonary Assessment Scores    Row Name 06/19/20 1124         ADL UCSD   ADL Phase Entry     SOB Score total 91           CAT Score   CAT Score 26           mMRC Score   mMRC Score 4           UCSD: Self-administered rating of dyspnea associated with activities of daily living (ADLs) 6-point scale (0 = "not at all" to 5 = "maximal or unable to do because of breathlessness")  Scoring Scores range from 0 to 120.  Minimally important difference is 5 units  CAT: CAT can identify the health impairment of COPD patients and is better correlated with disease progression.  CAT has a scoring range of zero to 40. The CAT score is classified into four groups of low (less than 10), medium (10 - 20), high (21-30) and very high (31-40) based on the impact level of disease on health status. A CAT score over 10 suggests significant symptoms.  A worsening CAT score could be explained by an exacerbation, poor medication adherence, poor inhaler technique, or progression of COPD or comorbid conditions.  CAT MCID is 2 points  mMRC: mMRC (Modified Medical Research Council) Dyspnea Scale is used to assess the degree of baseline functional disability in patients of respiratory disease due to dyspnea. No minimal important difference is established. A decrease in score of 1 point or greater is considered a positive change.   Pulmonary Function Assessment:  Pulmonary Function Assessment - 06/19/20 0952      Breath   Shortness of Breath Limiting activity;Yes           Exercise Target Goals: Exercise Program Goal: Individual exercise prescription set using results from initial 6 min  walk test and THRR while considering  patient's activity barriers and safety.   Exercise Prescription Goal: Initial exercise prescription builds to 30-45 minutes a day of aerobic activity, 2-3 days per week.  Home exercise guidelines will be given to patient during program as part of exercise prescription that the participant will acknowledge.  Activity Barriers & Risk Stratification:  Activity Barriers & Cardiac Risk Stratification - 06/19/20 0949      Activity Barriers & Cardiac Risk Stratification   Activity Barriers Shortness of Breath;Deconditioning;Balance Concerns           6 Minute Walk:  6 Minute Walk    Row Name 06/19/20 1108         6 Minute Walk   Phase Initial     Distance 856 feet     Walk Time 6 minutes     # of Rest Breaks 0     MPH 1.62     METS 1.71     RPE 15     Perceived Dyspnea  3     VO2 Peak 6     Symptoms Yes (comment)     Comments Patient complained of chest tightness that is chronic with exertion and shortness of breath. Pt has had heart cath and echo and everything was fine. Per pt, doctors think chest tightness is from lungs.     Resting HR 71 bpm     Resting BP 132/84     Resting Oxygen Saturation  98 %     Exercise Oxygen Saturation  during 6 min walk  94 %     Max Ex. HR 87 bpm     Max Ex. BP 144/90     2 Minute Post BP 132/90           Interval HR   1 Minute HR 87     2 Minute HR 83     3 Minute HR 83     4 Minute HR 83     5 Minute HR 82     6 Minute HR 84     2 Minute Post HR 75     Interval Heart Rate? Yes           Interval Oxygen   Interval Oxygen? Yes     Baseline Oxygen Saturation % 98 %     1 Minute Oxygen Saturation % 98 %     1 Minute Liters of Oxygen 0 L     2 Minute Oxygen Saturation % 94 %     2 Minute Liters of Oxygen 0 L     3 Minute Oxygen Saturation % 95 %     3 Minute Liters of Oxygen 0 L     4 Minute Oxygen Saturation % 96 %     4 Minute Liters of Oxygen 0 L     5 Minute Oxygen Saturation % 95 %     5  Minute Liters of Oxygen 0 L     6 Minute Oxygen Saturation % 95 %     6 Minute Liters of Oxygen 0 L     2 Minute Post Oxygen Saturation % 99 %     2 Minute Post Liters of Oxygen 0 L            Oxygen Initial Assessment:  Oxygen Initial Assessment - 06/19/20 0951      Home Oxygen   Home Oxygen Device Home Concentrator    Sleep Oxygen Prescription Continuous    Liters per minute 2    Home Exercise Oxygen Prescription None    Home Resting Oxygen Prescription None    Compliance with Home Oxygen Use Yes      Initial 6 min Walk   Oxygen Used None      Program Oxygen Prescription   Program Oxygen Prescription None      Intervention   Short Term Goals To learn and understand importance of monitoring SPO2 with pulse oximeter and demonstrate accurate use of the pulse oximeter.;To learn and understand importance of maintaining oxygen saturations>88%;To learn and demonstrate proper pursed lip breathing techniques or other breathing techniques.;To learn and demonstrate proper use of respiratory medications;To learn and exhibit compliance with exercise, home and travel O2 prescription    Long  Term Goals Exhibits compliance with exercise, home and travel O2 prescription;Verbalizes importance of monitoring SPO2 with pulse oximeter and return demonstration;Maintenance of O2 saturations>88%;Exhibits proper breathing techniques, such as pursed lip breathing or other method taught during program session;Compliance with respiratory medication;Demonstrates proper use of MDI's           Oxygen Re-Evaluation:  Oxygen Re-Evaluation    Row Name 07/21/20 1541             Program Oxygen Prescription   Program Oxygen Prescription None               Home Oxygen   Home Oxygen Device Home Concentrator       Sleep Oxygen Prescription Continuous       Liters per minute 2  Home Exercise Oxygen Prescription None       Home Resting Oxygen Prescription None       Compliance with Home Oxygen  Use Yes               Goals/Expected Outcomes   Short Term Goals To learn and understand importance of monitoring SPO2 with pulse oximeter and demonstrate accurate use of the pulse oximeter.;To learn and understand importance of maintaining oxygen saturations>88%;To learn and demonstrate proper pursed lip breathing techniques or other breathing techniques.;To learn and demonstrate proper use of respiratory medications;To learn and exhibit compliance with exercise, home and travel O2 prescription       Long  Term Goals Exhibits compliance with exercise, home and travel O2 prescription;Verbalizes importance of monitoring SPO2 with pulse oximeter and return demonstration;Maintenance of O2 saturations>88%;Exhibits proper breathing techniques, such as pursed lip breathing or other method taught during program session;Compliance with respiratory medication;Demonstrates proper use of MDI's       Comments Pt is compliant with night O2 prescription. Want patient to be able to perform pursed lip breathing without cueing and understand when pursed lip breathing is necessary.       Goals/Expected Outcomes compliance and understanding of oxygen saturation and pursed lip breathing.              Oxygen Discharge (Final Oxygen Re-Evaluation):  Oxygen Re-Evaluation - 07/21/20 1541      Program Oxygen Prescription   Program Oxygen Prescription None      Home Oxygen   Home Oxygen Device Home Concentrator    Sleep Oxygen Prescription Continuous    Liters per minute 2    Home Exercise Oxygen Prescription None    Home Resting Oxygen Prescription None    Compliance with Home Oxygen Use Yes      Goals/Expected Outcomes   Short Term Goals To learn and understand importance of monitoring SPO2 with pulse oximeter and demonstrate accurate use of the pulse oximeter.;To learn and understand importance of maintaining oxygen saturations>88%;To learn and demonstrate proper pursed lip breathing techniques or other  breathing techniques.;To learn and demonstrate proper use of respiratory medications;To learn and exhibit compliance with exercise, home and travel O2 prescription    Long  Term Goals Exhibits compliance with exercise, home and travel O2 prescription;Verbalizes importance of monitoring SPO2 with pulse oximeter and return demonstration;Maintenance of O2 saturations>88%;Exhibits proper breathing techniques, such as pursed lip breathing or other method taught during program session;Compliance with respiratory medication;Demonstrates proper use of MDI's    Comments Pt is compliant with night O2 prescription. Want patient to be able to perform pursed lip breathing without cueing and understand when pursed lip breathing is necessary.    Goals/Expected Outcomes compliance and understanding of oxygen saturation and pursed lip breathing.           Initial Exercise Prescription:  Initial Exercise Prescription - 06/19/20 1100      Date of Initial Exercise RX and Referring Provider   Date 06/19/20    Referring Provider Dr. Vaughan Browner    Expected Discharge Date 08/20/20      NuStep   Level 1    SPM 75    Minutes 15      Track   Minutes 15      Prescription Details   Frequency (times per week) 2    Duration Progress to 30 minutes of continuous aerobic without signs/symptoms of physical distress      Intensity   THRR 40-80% of Max Heartrate 57-114  Ratings of Perceived Exertion 11-13    Perceived Dyspnea 0-4      Progression   Progression Continue to progress workloads to maintain intensity without signs/symptoms of physical distress.      Resistance Training   Training Prescription Yes    Weight Blue bands    Reps 10-15           Perform Capillary Blood Glucose checks as needed.  Exercise Prescription Changes:  Exercise Prescription Changes    Row Name 06/30/20 1500 07/14/20 1500           Response to Exercise   Blood Pressure (Admit) 120/70 132/78      Blood Pressure  (Exercise) 130/80 144/82      Blood Pressure (Exit) 112/74 136/80      Heart Rate (Admit) 76 bpm 77 bpm      Heart Rate (Exercise) 83 bpm 86 bpm      Heart Rate (Exit) 84 bpm 77 bpm      Oxygen Saturation (Admit) 96 % 94 %      Oxygen Saturation (Exercise) 92 % 94 %      Oxygen Saturation (Exit) 97 % 95 %      Rating of Perceived Exertion (Exercise) 11 9      Perceived Dyspnea (Exercise) 2 1      Duration Progress to 30 minutes of  aerobic without signs/symptoms of physical distress Continue with 30 min of aerobic exercise without signs/symptoms of physical distress.      Intensity Other (comment)  40-80% of HRR THRR unchanged             Progression   Progression Continue to progress workloads to maintain intensity without signs/symptoms of physical distress. Continue to progress workloads to maintain intensity without signs/symptoms of physical distress.             Resistance Training   Training Prescription Yes Yes      Weight blue bands blue bands      Reps 10-15 10-15      Time 10 Minutes 10 Minutes             NuStep   Level 3 4      SPM 80 80      Minutes 15 15      METs 2.2 2.4             Track   Laps 15 16      Minutes 15 15      METs -- 2.86             Exercise Comments:  Exercise Comments    Row Name 06/23/20 1634           Exercise Comments Patient completed first day of exercise and tolerated well with no complaints or concerns. He was able to do 15 minutes on the Nustep with no rest breaks. He also walked 15 minutes without having to rest. We have him walking the track with a rolator because he says he has one at home that he uses for long distance. If we see that he does not need it at some point we will cut it out. His balance seems good for short distances but he becomes fatigue with longer distance. He was also able to do all resistance exercises with no issues.              Exercise Goals and Review:  Exercise Goals    Row Name 06/19/20  1043  Exercise Goals   Increase Physical Activity Yes       Intervention Provide advice, education, support and counseling about physical activity/exercise needs.;Develop an individualized exercise prescription for aerobic and resistive training based on initial evaluation findings, risk stratification, comorbidities and participant's personal goals.       Expected Outcomes Short Term: Attend rehab on a regular basis to increase amount of physical activity.;Long Term: Add in home exercise to make exercise part of routine and to increase amount of physical activity.;Long Term: Exercising regularly at least 3-5 days a week.       Increase Strength and Stamina Yes       Intervention Provide advice, education, support and counseling about physical activity/exercise needs.;Develop an individualized exercise prescription for aerobic and resistive training based on initial evaluation findings, risk stratification, comorbidities and participant's personal goals.       Expected Outcomes Short Term: Increase workloads from initial exercise prescription for resistance, speed, and METs.;Short Term: Perform resistance training exercises routinely during rehab and add in resistance training at home;Long Term: Improve cardiorespiratory fitness, muscular endurance and strength as measured by increased METs and functional capacity (6MWT)       Able to understand and use rate of perceived exertion (RPE) scale Yes       Intervention Provide education and explanation on how to use RPE scale       Expected Outcomes Short Term: Able to use RPE daily in rehab to express subjective intensity level;Long Term:  Able to use RPE to guide intensity level when exercising independently       Able to understand and use Dyspnea scale Yes       Intervention Provide education and explanation on how to use Dyspnea scale       Expected Outcomes Short Term: Able to use Dyspnea scale daily in rehab to express subjective sense  of shortness of breath during exertion;Long Term: Able to use Dyspnea scale to guide intensity level when exercising independently       Knowledge and understanding of Target Heart Rate Range (THRR) Yes       Intervention Provide education and explanation of THRR including how the numbers were predicted and where they are located for reference       Expected Outcomes Short Term: Able to state/look up THRR;Long Term: Able to use THRR to govern intensity when exercising independently;Short Term: Able to use daily as guideline for intensity in rehab       Understanding of Exercise Prescription Yes       Intervention Provide education, explanation, and written materials on patient's individual exercise prescription       Expected Outcomes Short Term: Able to explain program exercise prescription;Long Term: Able to explain home exercise prescription to exercise independently              Exercise Goals Re-Evaluation :  Exercise Goals Re-Evaluation    Row Name 07/21/20 1538             Exercise Goal Re-Evaluation   Exercise Goals Review Increase Physical Activity;Increase Strength and Stamina;Able to understand and use rate of perceived exertion (RPE) scale;Able to understand and use Dyspnea scale;Knowledge and understanding of Target Heart Rate Range (THRR);Understanding of Exercise Prescription       Comments Patient has completed 9 exercise sessions and has been steady with making progressions with workload increases and MET increases. He is independent with resistance training and stretches. He is exercising at 2.8 METS on level 4 on the  Nustep and 3.32 METS walking the track. Will continue to monitor and progress as he is able. Will also be discussing home exercise with patient in upcoming weeks.       Expected Outcomes Through exercise at rehab and home, the patient will decrease shortness of breath with daily activities and feel confident in carrying out an exercise regimn at home.               Discharge Exercise Prescription (Final Exercise Prescription Changes):  Exercise Prescription Changes - 07/14/20 1500      Response to Exercise   Blood Pressure (Admit) 132/78    Blood Pressure (Exercise) 144/82    Blood Pressure (Exit) 136/80    Heart Rate (Admit) 77 bpm    Heart Rate (Exercise) 86 bpm    Heart Rate (Exit) 77 bpm    Oxygen Saturation (Admit) 94 %    Oxygen Saturation (Exercise) 94 %    Oxygen Saturation (Exit) 95 %    Rating of Perceived Exertion (Exercise) 9    Perceived Dyspnea (Exercise) 1    Duration Continue with 30 min of aerobic exercise without signs/symptoms of physical distress.    Intensity THRR unchanged      Progression   Progression Continue to progress workloads to maintain intensity without signs/symptoms of physical distress.      Resistance Training   Training Prescription Yes    Weight blue bands    Reps 10-15    Time 10 Minutes      NuStep   Level 4    SPM 80    Minutes 15    METs 2.4      Track   Laps 16    Minutes 15    METs 2.86           Nutrition:  Target Goals: Understanding of nutrition guidelines, daily intake of sodium <1537m, cholesterol <2050m calories 30% from fat and 7% or less from saturated fats, daily to have 5 or more servings of fruits and vegetables.  Biometrics:  Pre Biometrics - 06/19/20 1044      Pre Biometrics   Grip Strength 36 kg            Nutrition Therapy Plan and Nutrition Goals:  Nutrition Therapy & Goals - 07/09/20 1443      Nutrition Therapy   Diet TLC    Drug/Food Interactions Statins/Certain Fruits      Personal Nutrition Goals   Nutrition Goal Pt to build a healthy plate including vegetables, fruits, whole grains, and low-fat dairy products in a heart healthy meal plan.      Intervention Plan   Intervention Prescribe, educate and counsel regarding individualized specific dietary modifications aiming towards targeted core components such as weight, hypertension, lipid  management, diabetes, heart failure and other comorbidities.    Expected Outcomes Short Term Goal: Understand basic principles of dietary content, such as calories, fat, sodium, cholesterol and nutrients.           Nutrition Assessments:  MEDIFICTS Score Key:  ?70 Need to make dietary changes   40-70 Heart Healthy Diet  ? 40 Therapeutic Level Cholesterol Diet  Flowsheet Row PULMONARY REHAB OTHER [facility]PIRAT[facility] [place]     Picture Your Plate Scores:  <4<83nhealthy dietary pattern with much room for improvement.  41-50 Dietary pattern unlikely to meet recommendations for good health and room for improvement.  51-60 More  healthful dietary pattern, with some room for improvement.   >60 Healthy dietary pattern, although there may be some specific behaviors that could be improved.    Nutrition Goals Re-Evaluation:  Nutrition Goals Re-Evaluation    [place] Name 07/09/20 1443 07/15/20 1524           Goals   Current Weight 201 lb (91.2 kg) 202 lb 2.6 oz (91.7 kg)      Nutrition Goal -- Pt to build a healthy plate including vegetables, fruits, whole grains, and low-fat dairy products in a heart healthy meal plan.             Nutrition Goals Discharge (Final Nutrition Goals Re-Evaluation):  Nutrition Goals Re-Evaluation - 07/15/20 1524      Goals   Current Weight 202 lb 2.6 oz (91.7 kg)    Nutrition Goal Pt to build a healthy plate including vegetables, fruits, whole grains, and low-fat dairy products in a heart healthy meal plan.           Psychosocial: Target Goals: Acknowledge presence or absence of significant depression and/or stress, maximize coping skills, provide positive support system. Participant is able to verbalize types and ability to use techniques and skills needed for reducing stress and depression.  Initial Review & Psychosocial Screening:  Initial Psych  Review & Screening - 06/19/20 0953      Initial Review   Current issues with None Identified      Family Dynamics   Good Support System? Yes   Wife is very supportive and takes good care of him     Barriers   Psychosocial barriers to participate in program The patient should benefit from training in stress management and relaxation.      Screening Interventions   Interventions Encouraged to exercise           Quality of Life Scores:  Scores of 19 and below usually indicate a poorer quality of life in these areas.  A difference of  2-3 points is a clinically meaningful difference.  A difference of 2-3 points in the total score of the Quality of Life Index has been associated with significant improvement in overall quality of life, self-image, physical symptoms, and general health in studies assessing change in quality of life.  PHQ-9: Recent Review Flowsheet Data    Depression screen [facility] 2/9 06/19/2020   Decreased Interest 0   Down, Depressed, Hopeless 0   PHQ - 2 Score 0   Altered sleeping 0   Tired, decreased energy 3   Change in appetite 0   Feeling bad or failure about yourself  0   Trouble concentrating 1   Moving slowly or fidgety/restless 0   Suicidal thoughts 0   Difficult doing work/chores Somewhat difficult     Interpretation of Total Score  Total Score Depression Severity:  1-4 = Minimal depression, 5-9 = Mild depression, 10-14 = Moderate depression, 15-19 = Moderately severe depression, 20-27 = Severe depression   Psychosocial Evaluation and Intervention:   Psychosocial Re-Evaluation:  Psychosocial Re-Evaluation    Mart Name 06/22/20 1405 07/20/20 1355           Psychosocial Re-Evaluation   Current issues with None Identified None Identified      Comments No change in psychosocial assessment since orientation/walk test.  Preet will begin exercising on 06/23/2020. No concerns isentified at this time.      Expected Outcomes For Therman to be free of  psychosocial concerns while particpating in pulmonary rehab. For "Buddy"  to continue to be free of psychsocial concerns while participating in pulmonary rehab.      Interventions Encouraged to attend Pulmonary Rehabilitation for the exercise Encouraged to attend Pulmonary Rehabilitation for the exercise      Continue Psychosocial Services  No Follow up required No Follow up required             Psychosocial Discharge (Final Psychosocial Re-Evaluation):  Psychosocial Re-Evaluation - 07/20/20 1355      Psychosocial Re-Evaluation   Current issues with None Identified    Comments No concerns isentified at this time.    Expected Outcomes For "Buddy" to continue to be free of psychsocial concerns while participating in pulmonary rehab.    Interventions Encouraged to attend Pulmonary Rehabilitation for the exercise    Continue Psychosocial Services  No Follow up required           Education: Education Goals: Education classes will be provided on a weekly basis, covering required topics. Participant will state understanding/return demonstration of topics presented.  Learning Barriers/Preferences:  Learning Barriers/Preferences - 06/19/20 0955      Learning Barriers/Preferences   Learning Barriers Hearing   Has (R) and (L) hearing aids   Learning Preferences Skilled Demonstration;Individual Instruction           Education Topics: Risk Factor Reduction:  -Group instruction that is supported by a PowerPoint presentation. Instructor discusses the definition of a risk factor, different risk factors for pulmonary disease, and how the heart and lungs work together.     Nutrition for Pulmonary Patient:  -Group instruction provided by PowerPoint slides, verbal discussion, and written materials to support subject matter. The instructor gives an explanation and review of healthy diet recommendations, which includes a discussion on weight management, recommendations for fruit and vegetable  consumption, as well as protein, fluid, caffeine, fiber, sodium, sugar, and alcohol. Tips for eating when patients are short of breath are discussed.   Pursed Lip Breathing:  -Group instruction that is supported by demonstration and informational handouts. Instructor discusses the benefits of pursed lip and diaphragmatic breathing and detailed demonstration on how to preform both.     Oxygen Safety:  -Group instruction provided by PowerPoint, verbal discussion, and written material to support subject matter. There is an overview of "What is Oxygen" and "Why do we need it".  Instructor also reviews how to create a safe environment for oxygen use, the importance of using oxygen as prescribed, and the risks of noncompliance. There is a brief discussion on traveling with oxygen and resources the patient may utilize. Flowsheet Row PULMONARY REHAB OTHER RESPIRATORY from 07/02/2020 in [place]  Date 06/25/20  Educator handout      Oxygen Equipment:  -Group instruction provided by [facility] Staff utilizing handouts, written materials, and equipment demonstrations.   Signs and Symptoms:  -Group instruction provided by written material and verbal discussion to support subject matter. Warning signs and symptoms of infection, stroke, and heart attack are reviewed and when to call the physician/911 reinforced. Tips for preventing the spread of infection discussed.   Advanced Directives:  -Group instruction provided by verbal instruction and written material to support subject matter. Instructor reviews Advanced Directive laws and proper instruction for filling out document.   Pulmonary Video:  -Group video education that reviews the importance of medication and oxygen compliance, exercise, good nutrition, pulmonary hygiene, and pursed lip and diaphragmatic breathing for the pulmonary patient.   Exercise for the Pulmonary Patient:  -Group instruction that  is  supported by a PowerPoint presentation. Instructor discusses benefits of exercise, core components of exercise, frequency, duration, and intensity of an exercise routine, importance of utilizing pulse oximetry during exercise, safety while exercising, and options of places to exercise outside of rehab.     Pulmonary Medications:  -Verbally interactive group education provided by instructor with focus on inhaled medications and proper administration.   Anatomy and Physiology of the Respiratory System and Intimacy:  -Group instruction provided by PowerPoint, verbal discussion, and written material to support subject matter. Instructor reviews respiratory cycle and anatomical components of the respiratory system and their functions. Instructor also reviews differences in obstructive and restrictive respiratory diseases with examples of each. Intimacy, Sex, and Sexuality differences are reviewed with a discussion on how relationships can change when diagnosed with pulmonary disease. Common sexual concerns are reviewed. Flowsheet Row PULMONARY REHAB OTHER RESPIRATORY from 07/02/2020 in [place]  Date 07/02/20  Educator Handout      MD DAY -A group question and answer session with a medical doctor that allows participants to ask questions that relate to their pulmonary disease state.   OTHER EDUCATION -Group or individual verbal, written, or video instructions that support the educational goals of the pulmonary rehab program.   Holiday Eating Survival Tips:  -Group instruction provided by PowerPoint slides, verbal discussion, and written materials to support subject matter. The instructor gives patients tips, tricks, and techniques to help them not only survive but enjoy the holidays despite the onslaught of food that accompanies the holidays.   Knowledge Questionnaire Score:  Knowledge Questionnaire Score - 06/19/20 1123      Knowledge Questionnaire Score    Pre Score 13/18           Core Components/Risk Factors/Patient Goals at Admission:  Personal Goals and Risk Factors at Admission - 06/19/20 1045      Core Components/Risk Factors/Patient Goals on Admission   Improve shortness of breath with ADL's Yes    Intervention Provide education, individualized exercise plan and daily activity instruction to help decrease symptoms of SOB with activities of daily living.    Expected Outcomes Short Term: Improve cardiorespiratory fitness to achieve a reduction of symptoms when performing ADLs;Long Term: Be able to perform more ADLs without symptoms or delay the onset of symptoms    Hypertension Yes    Intervention Provide education on lifestyle modifcations including regular physical activity/exercise, weight management, moderate sodium restriction and increased consumption of fresh fruit, vegetables, and low fat dairy, alcohol moderation, and smoking cessation.    Expected Outcomes Long Term: Maintenance of blood pressure at goal levels.           Core Components/Risk Factors/Patient Goals Review:   Goals and Risk Factor Review    Row Name 06/22/20 1407 07/20/20 1356           Core Components/Risk Factors/Patient Goals Review   Personal Goals Review Develop more efficient breathing techniques such as purse lipped breathing and diaphragmatic breathing and practicing self-pacing with activity.;Increase knowledge of respiratory medications and ability to use respiratory devices properly.;Improve shortness of breath with ADL's Develop more efficient breathing techniques such as purse lipped breathing and diaphragmatic breathing and practicing self-pacing with activity.;Increase knowledge of respiratory medications and ability to use respiratory devices properly.;Improve shortness of breath with ADL's      Review Wash will begin exercising in pulmonary rehab 06/23/2020. Buddy is progressing well despite his shortness of breath.  Since he had covid-19 1  year  ago he struggles with shortness of breath with any activity. He is exercising @ 2.5 mets on the nustep and walking 16 laps on the track in 15 minutes.      Expected Outcomes See admission goals. --             Core Components/Risk Factors/Patient Goals at Discharge (Final Review):   Goals and Risk Factor Review - 07/20/20 1356      Core Components/Risk Factors/Patient Goals Review   Personal Goals Review Develop more efficient breathing techniques such as purse lipped breathing and diaphragmatic breathing and practicing self-pacing with activity.;Increase knowledge of respiratory medications and ability to use respiratory devices properly.;Improve shortness of breath with ADL's    Review Buddy is progressing well despite his shortness of breath.  Since he had covid-19 1 year ago he struggles with shortness of breath with any activity. He is exercising @ 2.5 mets on the nustep and walking 16 laps on the track in 15 minutes.           ITP Comments:   Comments:

## 2020-07-21 NOTE — Progress Notes (Signed)
Daily Session Note  Patient Details  Name: Alex [name]. MRN: [id number] Date of Birth: 10/20/1940 Referring Provider:   April Manson Pulmonary Rehab Walk Test from 06/19/2020 in [place]  Referring Provider Dr. Vaughan Browner      Encounter Date: 07/21/2020  Check In:  Session Check In - 07/21/20 1421      Check-In   Supervising physician immediately available to respond to emergencies [facility] immediately available    Physician(s) Dr. Tawanna Solo    Location [facility]    Staff Present Rosebud Poles, RN, BSN;Lisa Ysidro Evert, RN;Latiya Navia Hassell Done, MS, ACSM-CEP, Exercise Physiologist    Virtual Visit No    Medication changes reported     No    Fall or balance concerns reported    No    Tobacco Cessation No Change    Warm-up and Cool-down Performed on first and last piece of equipment    Resistance Training Performed Yes    VAD Patient? No    PAD/SET Patient? No      Pain Assessment   Currently in Pain? No/denies    Multiple Pain Sites No           Capillary Blood Glucose: No results found for this or any previous visit (from the past 24 hour(s)).    Social History   Tobacco Use  Smoking Status Former Smoker  . Packs/day: 3.00  . Years: 20.00  . Pack years: 60.00  . Types: Cigarettes  . Quit date: 04/25/1969  . Years since quitting: 51.2  Smokeless Tobacco Former Systems developer  . Types: Chew  . Quit date: 11/07/1983    Goals Met:  Proper associated with RPD/PD & O2 Sat Independence with exercise equipment Exercise tolerated well No report of cardiac concerns or symptoms Strength training completed today  Goals Unmet:  Not Applicable  Comments: Service time is from 1400 to 1515    Dr. Fransico Him is Medical Director for Cardiac Rehab at [facility].

## 2020-07-23 ENCOUNTER — Encounter (HOSPITAL_COMMUNITY)
Admission: RE | Admit: 2020-07-23 | Discharge: 2020-07-23 | Disposition: A | Discharge status: Home / Self Care | Payer: Medicare Other | Source: Ambulatory Visit | Attending: Pulmonary Disease | Admitting: Pulmonary Disease

## 2020-07-23 ENCOUNTER — Other Ambulatory Visit: Payer: Self-pay

## 2020-07-23 VITALS — Wt 200.2 lb

## 2020-07-23 DIAGNOSIS — R0600 Dyspnea, unspecified: Secondary | ICD-10-CM

## 2020-07-23 NOTE — Progress Notes (Signed)
Daily Session Note  Patient Details  Name: Alex [name]. MRN: [id number] Date of Birth: 1940-06-28 Referring Provider:   April Manson Pulmonary Rehab Walk Test from 06/19/2020 in [place]  Referring Provider Dr. Vaughan Browner      Encounter Date: 07/23/2020  Check In:  Session Check In - 07/23/20 1422      Check-In   Supervising physician immediately available to respond to emergencies [facility] immediately available    Physician(s) Dr. Florencia Reasons    [facility]    Staff Present Rosebud Poles, RN, BSN;Lisa Ysidro Evert, RN;Jessica Hassell Done, MS, ACSM-CEP, Exercise Physiologist    Virtual Visit No    Medication changes reported     No    Fall or balance concerns reported    No    Tobacco Cessation No Change    Warm-up and Cool-down Performed on first and last piece of equipment    Resistance Training Performed Yes    VAD Patient? No    PAD/SET Patient? No      Pain Assessment   Currently in Pain? No/denies    Multiple Pain Sites No           Capillary Blood Glucose: No results found for this or any previous visit (from the past 24 hour(s)).    Social History   Tobacco Use  Smoking Status Former Smoker  . Packs/day: 3.00  . Years: 20.00  . Pack years: 60.00  . Types: Cigarettes  . Quit date: 04/25/1969  . Years since quitting: 51.2  Smokeless Tobacco Former Systems developer  . Types: Chew  . Quit date: 11/07/1983    Goals Met:  Proper associated with RPD/PD & O2 Sat Exercise tolerated well Strength training completed today  Goals Unmet:  Not Applicable  Comments: Service time is from 1355 to 1507.    Dr. Fransico Him is Medical Director for Cardiac Rehab at [facility].

## 2020-07-28 ENCOUNTER — Encounter (HOSPITAL_COMMUNITY)
Admission: RE | Admit: 2020-07-28 | Discharge: 2020-07-28 | Disposition: A | Discharge status: Home / Self Care | Payer: Medicare Other | Source: Ambulatory Visit | Attending: Pulmonary Disease | Admitting: Pulmonary Disease

## 2020-07-28 ENCOUNTER — Other Ambulatory Visit: Payer: Self-pay

## 2020-07-28 DIAGNOSIS — R0600 Dyspnea, unspecified: Secondary | ICD-10-CM | POA: Diagnosis not present

## 2020-07-28 NOTE — Progress Notes (Signed)
Daily Session Note  Patient Details  Name: Alex [name]. MRN: [id number] Date of Birth: Apr 17, 1941 Referring Provider:   April Manson Pulmonary Rehab Walk Test from 06/19/2020 in [place]  Referring Provider Dr. Vaughan Browner      Encounter Date: 07/28/2020  Check In:  Session Check In - 07/28/20 1435      Check-In   Supervising physician immediately available to respond to emergencies [facility] immediately available    Physician(s) Dr. Tawanna Solo    [facility]    Staff Present Maurice Small, RN, BSN;Lisa Ysidro Evert, RN;Turki Tapanes Hassell Done, MS, ACSM-CEP, Exercise Physiologist    Virtual Visit No    Medication changes reported     No    Fall or balance concerns reported    No    Tobacco Cessation No Change    Warm-up and Cool-down Performed on first and last piece of equipment    Resistance Training Performed Yes    VAD Patient? No    PAD/SET Patient? No      Pain Assessment   Currently in Pain? No/denies    Pain Score 0-No pain    Multiple Pain Sites No           Capillary Blood Glucose: No results found for this or any previous visit (from the past 24 hour(s)).    Social History   Tobacco Use  Smoking Status Former Smoker  . Packs/day: 3.00  . Years: 20.00  . Pack years: 60.00  . Types: Cigarettes  . Quit date: 04/25/1969  . Years since quitting: 51.2  Smokeless Tobacco Former Systems developer  . Types: Chew  . Quit date: 11/07/1983    Goals Met:  Proper associated with RPD/PD & O2 Sat Independence with exercise equipment Exercise tolerated well Strength training completed today  Goals Unmet:  Not Applicable  Comments: Service time is from 1350 to 1455    Dr. Fransico Him is Medical Director for Cardiac Rehab at [facility].

## 2020-07-28 NOTE — Progress Notes (Signed)
Daily Session Note  Patient Details  Name: Alex [name]. MRN: [id number] Date of Birth: [birth year] Referring Provider:   Flowsheet Row Pulmonary Rehab Walk Test from 06/19/2020 in [place]  Referring Provider Dr. Vaughan Browner      Encounter Date: 07/23/2020  Check In:   Capillary Blood Glucose: No results found for this or any previous visit (from the past 24 hour(s)).   Exercise Prescription Changes - 07/28/20 1500      Response to Exercise   Blood Pressure (Admit) 156/80    Blood Pressure (Exercise) 124/80    Blood Pressure (Exit) 126/74    Heart Rate (Admit) 68 bpm    Heart Rate (Exercise) 85 bpm    Heart Rate (Exit) 83 bpm    Oxygen Saturation (Admit) 96 %    Oxygen Saturation (Exercise) 94 %    Oxygen Saturation (Exit) 97 %    Rating of Perceived Exertion (Exercise) 13    Perceived Dyspnea (Exercise) 3    Duration Continue with 30 min of aerobic exercise without signs/symptoms of physical distress.    Intensity THRR unchanged      Progression   Progression Continue to progress workloads to maintain intensity without signs/symptoms of physical distress.      Resistance Training   Training Prescription Yes    Weight blue bands    Reps 10-15    Time 10 Minutes      NuStep   Level 4    SPM 80    Minutes 15    METs 2.9      Track   Laps 16    Minutes 15    METs 2.86           Social History   Tobacco Use  Smoking Status Former Smoker  . Packs/day: 3.00  . Years: 20.00  . Pack years: 60.00  . Types: Cigarettes  . Quit date: 04/25/1969  . Years since quitting: 51.2  Smokeless Tobacco Former Systems developer  . Types: Chew  . Quit date: 11/07/1983    Goals Met:  Proper associated with RPD/PD & O2 Sat Independence with exercise equipment Exercise tolerated well Strength training completed today  Goals Unmet:  Not Applicable  Comments: Service time is from 1350 to 1455    Dr. Fransico Him is Medical Director for  Cardiac Rehab at [facility].

## 2020-07-30 ENCOUNTER — Encounter (HOSPITAL_COMMUNITY)
Admission: RE | Admit: 2020-07-30 | Discharge: 2020-07-30 | Disposition: A | Discharge status: Home / Self Care | Payer: Medicare Other | Source: Ambulatory Visit | Attending: Pulmonary Disease | Admitting: Pulmonary Disease

## 2020-07-30 ENCOUNTER — Other Ambulatory Visit: Payer: Self-pay

## 2020-07-30 DIAGNOSIS — R0600 Dyspnea, unspecified: Secondary | ICD-10-CM

## 2020-07-30 NOTE — Progress Notes (Signed)
Daily Session Note  Patient Details  Name: Alex O Noguchi Jr. MRN: [id number] Date of Birth: 09/02/1940 Referring Provider:   Flowsheet Row Pulmonary Rehab Walk Test from 06/19/2020 in [facility]  Referring Provider Dr. Mannam      Encounter Date: 07/30/2020  Check In:  Session Check In - 07/30/20 1438      Check-In   Supervising physician immediately available to respond to emergencies [facility] immediately available    Physician(s) Dr. Adikari    [facility]    Staff Present Joan Behrens, RN, BSN;Lisa Hughes, RN;Jessica Martin, MS, ACSM-CEP, Exercise Physiologist    Virtual Visit No    Medication changes reported     No    Fall or balance concerns reported    No    Tobacco Cessation No Change    Warm-up and Cool-down Performed on first and last piece of equipment    Resistance Training Performed Yes    VAD Patient? No    PAD/SET Patient? No      Pain Assessment   Currently in Pain? No/denies    Multiple Pain Sites No           Capillary Blood Glucose: No results found for this or any previous visit (from the past 24 hour(s)).    Social History   Tobacco Use  Smoking Status Former Smoker  . Packs/day: 3.00  . Years: 20.00  . Pack years: 60.00  . Types: Cigarettes  . Quit date: 04/25/1969  . Years since quitting: 51.2  Smokeless Tobacco Former User  . Types: Chew  . Quit date: 11/07/1983    Goals Met:  Exercise tolerated well No report of cardiac concerns or symptoms Strength training completed today  Goals Unmet:  Not Applicable  Comments: Service time is from 1330 to 1450    Dr. Traci Turner is Medical Director for Cardiac Rehab at [facility]. 

## 2020-07-31 ENCOUNTER — Telehealth: Payer: Self-pay | Admitting: Hematology

## 2020-07-31 NOTE — Telephone Encounter (Signed)
Rescheduled upcoming appointment due to provider's PAL. Patient is aware of changes. ?

## 2020-08-03 ENCOUNTER — Inpatient Hospital Stay: Payer: Medicare Other

## 2020-08-04 ENCOUNTER — Encounter (HOSPITAL_COMMUNITY)
Admission: RE | Admit: 2020-08-04 | Discharge: 2020-08-04 | Disposition: A | Discharge status: Home / Self Care | Payer: Medicare Other | Source: Ambulatory Visit | Attending: Pulmonary Disease | Admitting: Pulmonary Disease

## 2020-08-04 ENCOUNTER — Other Ambulatory Visit: Payer: Self-pay

## 2020-08-04 DIAGNOSIS — R0600 Dyspnea, unspecified: Secondary | ICD-10-CM

## 2020-08-04 NOTE — Progress Notes (Signed)
Daily Session Note  Patient Details  Name: Alex [name]. MRN: [id number] Date of Birth: 1940/12/25 Referring Provider:   April Manson Pulmonary Rehab Walk Test from 06/19/2020 in [place]  Referring Provider Dr. Vaughan Browner      Encounter Date: 08/04/2020  Check In:  Session Check In - 08/04/20 1433      Check-In   Supervising physician immediately available to respond to emergencies [facility] immediately available    Physician(s) Dr. Tawanna Solo    [facility]    Staff Present Rosebud Poles, RN, Milus Glazier, MS, EP-C, CCRP;Jessica Hassell Done, MS, ACSM-CEP, Exercise Physiologist    Virtual Visit No    Medication changes reported     No    Fall or balance concerns reported    No    Tobacco Cessation No Change    Warm-up and Cool-down Performed on first and last piece of equipment    Resistance Training Performed Yes    VAD Patient? No    PAD/SET Patient? No      Pain Assessment   Currently in Pain? No/denies    Multiple Pain Sites No           Capillary Blood Glucose: No results found for this or any previous visit (from the past 24 hour(s)).    Social History   Tobacco Use  Smoking Status Former Smoker  . Packs/day: 3.00  . Years: 20.00  . Pack years: 60.00  . Types: Cigarettes  . Quit date: 04/25/1969  . Years since quitting: 51.3  Smokeless Tobacco Former Systems developer  . Types: Chew  . Quit date: 11/07/1983    Goals Met:  Proper associated with RPD/PD & O2 Sat Exercise tolerated well Strength training completed today  Goals Unmet:  Not Applicable  Comments: Service time is from 1400 to 1510    Dr. Fransico Him is Medical Director for Cardiac Rehab at [facility].

## 2020-08-06 ENCOUNTER — Encounter (HOSPITAL_COMMUNITY)
Admission: RE | Admit: 2020-08-06 | Discharge: 2020-08-06 | Disposition: A | Discharge status: Home / Self Care | Payer: Medicare Other | Source: Ambulatory Visit | Attending: Pulmonary Disease | Admitting: Pulmonary Disease

## 2020-08-06 ENCOUNTER — Other Ambulatory Visit: Payer: Self-pay

## 2020-08-06 DIAGNOSIS — R0600 Dyspnea, unspecified: Secondary | ICD-10-CM | POA: Diagnosis not present

## 2020-08-06 NOTE — Progress Notes (Signed)
Daily Session Note  Patient Details  Name: Alex [name]. MRN: [id number] Date of Birth: [birth year] Referring Provider:   April Manson Pulmonary Rehab Walk Test from 06/19/2020 in [place]  Referring Provider Dr. Vaughan Browner      Encounter Date: 08/06/2020  Check In:  Session Check In - 08/06/20 1432      Check-In   Supervising physician immediately available to respond to emergencies [facility] immediately available    Physician(s) Dr. Florencia Reasons    [facility]    Staff Present Rosebud Poles, RN, BSN;Lisa Ysidro Evert, RN;Jessica Hassell Done, MS, ACSM-CEP, Exercise Physiologist    Virtual Visit No    Medication changes reported     No    Fall or balance concerns reported    No    Tobacco Cessation No Change    Warm-up and Cool-down Performed on first and last piece of equipment    Resistance Training Performed Yes    VAD Patient? No    PAD/SET Patient? No      Pain Assessment   Currently in Pain? No/denies    Multiple Pain Sites No           Capillary Blood Glucose: No results found for this or any previous visit (from the past 24 hour(s)).    Social History   Tobacco Use  Smoking Status Former Smoker  . Packs/day: 3.00  . Years: 20.00  . Pack years: 60.00  . Types: Cigarettes  . Quit date: 04/25/1969  . Years since quitting: 51.3  Smokeless Tobacco Former Systems developer  . Types: Chew  . Quit date: 11/07/1983    Goals Met:  Proper associated with RPD/PD & O2 Sat Exercise tolerated well Strength training completed today  Goals Unmet:  Not Applicable  Comments: Service time is from 1355 to 1510    Dr. Fransico Him is Medical Director for Cardiac Rehab at [facility].

## 2020-08-10 ENCOUNTER — Inpatient Hospital Stay: Payer: Medicare Other | Admitting: Hematology

## 2020-08-11 ENCOUNTER — Encounter (HOSPITAL_COMMUNITY)
Admission: RE | Admit: 2020-08-11 | Discharge: 2020-08-11 | Disposition: A | Discharge status: Home / Self Care | Payer: Medicare Other | Source: Ambulatory Visit | Attending: Pulmonary Disease | Admitting: Pulmonary Disease

## 2020-08-11 ENCOUNTER — Other Ambulatory Visit: Payer: Self-pay

## 2020-08-11 VITALS — Wt 197.1 lb

## 2020-08-11 DIAGNOSIS — R0600 Dyspnea, unspecified: Secondary | ICD-10-CM

## 2020-08-11 NOTE — Progress Notes (Signed)
Daily Session Note  Patient Details  Name: Alex [name]. MRN: [id number] Date of Birth: 05-Aug-1940 Referring Provider:   April Manson Pulmonary Rehab Walk Test from 06/19/2020 in [place]  Referring Provider Dr. Vaughan Browner      Encounter Date: 08/11/2020  Check In:  Session Check In - 08/11/20 1447      Check-In   Supervising physician immediately available to respond to emergencies [facility] immediately available    Physician(s) Dr. Tawanna Solo    Location [facility]    Staff Present Rosebud Poles, RN, BSN;Lisa Ysidro Evert, RN;Jessica Hassell Done, MS, ACSM-CEP, Exercise Physiologist    Virtual Visit No    Medication changes reported     No    Fall or balance concerns reported    No    Tobacco Cessation No Change    Warm-up and Cool-down Performed on first and last piece of equipment    Resistance Training Performed Yes    VAD Patient? No    PAD/SET Patient? No      Pain Assessment   Currently in Pain? No/denies    Multiple Pain Sites No           Capillary Blood Glucose: No results found for this or any previous visit (from the past 24 hour(s)).   Exercise Prescription Changes - 08/11/20 1500      Response to Exercise   Blood Pressure (Admit) 104/68    Blood Pressure (Exercise) 134/80    Blood Pressure (Exit) 110/70    Heart Rate (Admit) 87 bpm    Heart Rate (Exercise) 87 bpm    Heart Rate (Exit) 98 bpm    Oxygen Saturation (Admit) 98 %    Oxygen Saturation (Exercise) 96 %    Oxygen Saturation (Exit) 98 %    Rating of Perceived Exertion (Exercise) 11    Perceived Dyspnea (Exercise) 2    Duration Continue with 30 min of aerobic exercise without signs/symptoms of physical distress.    Intensity THRR unchanged      Progression   Progression Continue to progress workloads to maintain intensity without signs/symptoms of physical distress.      Resistance Training   Training Prescription Yes    Weight blue bands     Reps 10-15    Time 10 Minutes      NuStep   Level 4    SPM 80    Minutes 15    METs 2.9      Track   Laps 18    Minutes 15           Social History   Tobacco Use  Smoking Status Former Smoker  . Packs/day: 3.00  . Years: 20.00  . Pack years: 60.00  . Types: Cigarettes  . Quit date: 04/25/1969  . Years since quitting: 51.3  Smokeless Tobacco Former Systems developer  . Types: Chew  . Quit date: 11/07/1983    Goals Met:  Proper associated with RPD/PD & O2 Sat Exercise tolerated well Strength training completed today  Goals Unmet:  Not Applicable  Comments: Service time is from 1345 to 1450    Dr. Fransico Him is Medical Director for Cardiac Rehab at [facility].

## 2020-08-12 ENCOUNTER — Ambulatory Visit
Admission: RE | Admit: 2020-08-12 | Discharge: 2020-08-12 | Disposition: A | Discharge status: Home / Self Care | Payer: Medicare Other | Source: Ambulatory Visit | Attending: Internal Medicine | Admitting: Internal Medicine

## 2020-08-12 DIAGNOSIS — I714 Abdominal aortic aneurysm, without rupture, unspecified: Secondary | ICD-10-CM

## 2020-08-13 ENCOUNTER — Other Ambulatory Visit: Payer: Self-pay

## 2020-08-13 ENCOUNTER — Encounter (HOSPITAL_COMMUNITY)
Admission: RE | Admit: 2020-08-13 | Discharge: 2020-08-13 | Disposition: A | Discharge status: Home / Self Care | Payer: Medicare Other | Source: Ambulatory Visit | Attending: Pulmonary Disease | Admitting: Pulmonary Disease

## 2020-08-13 DIAGNOSIS — R0600 Dyspnea, unspecified: Secondary | ICD-10-CM

## 2020-08-13 NOTE — Progress Notes (Signed)
Daily Session Note  Patient Details  Name: Alex [name]. MRN: [id number] Date of Birth: [birth year] Referring Provider:   April Manson Pulmonary Rehab Walk Test from 06/19/2020 in [place]  Referring Provider Dr. Vaughan Browner      Encounter Date: 08/13/2020  Check In:  Session Check In - 08/13/20 1443      Check-In   Supervising physician immediately available to respond to emergencies [facility] immediately available    Physician(s) DR. Adkihari    Location [facility]    Staff Present Rosebud Poles, RN, BSN;Yona Kosek Ysidro Evert, RN;Jessica Hassell Done, MS, ACSM-CEP, Exercise Physiologist    Virtual Visit No    Medication changes reported     No    Fall or balance concerns reported    No    Tobacco Cessation No Change    Warm-up and Cool-down Performed on first and last piece of equipment    Resistance Training Performed Yes    VAD Patient? No    PAD/SET Patient? No      Pain Assessment   Currently in Pain? No/denies    Multiple Pain Sites No           Capillary Blood Glucose: No results found for this or any previous visit (from the past 24 hour(s)).   Exercise Prescription Changes - 08/13/20 1500      Home Exercise Plan   Plans to continue exercise at Home (comment)   Walking and resistance training with bands   Frequency Add 3 additional days to program exercise sessions.    Initial Home Exercises Provided 08/13/20           Social History   Tobacco Use  Smoking Status Former Smoker  . Packs/day: 3.00  . Years: 20.00  . Pack years: 60.00  . Types: Cigarettes  . Quit date: 04/25/1969  . Years since quitting: 51.3  Smokeless Tobacco Former Systems developer  . Types: Chew  . Quit date: 11/07/1983    Goals Met:  Exercise tolerated well No report of cardiac concerns or symptoms Strength training completed today  Goals Unmet:  Not Applicable  Comments: Service time is from 1325 to 1445    Dr. Fransico Him is  Medical Director for Cardiac Rehab at [facility].

## 2020-08-13 NOTE — Progress Notes (Signed)
I have reviewed a Home Exercise Prescription with Alex [name]. Alex [name] is currently exercising at home. He states that he walks at least 30 minutes nearly every day. The patient was advised to continue walking and to add in resistance training with bands 2-3 additional days a week for 30-45 minutes.  Alex [name] and I discussed how to progress their exercise prescription.  The patient stated that their goals were to keep going and to decrease shortness of breath with activity.  The patient stated that they understand the exercise prescription.  We reviewed exercise guidelines, target heart rate during exercise, RPE Scale, weather conditions, rescue inhaler use, endpoints for exercise, warmup and cool down.  Patient is encouraged to come to me with any questions. I will continue to follow up with the patient to assist them with progression and safety.    Rick Duff MS, ACSM CEP 3:32 PM 08/13/2020

## 2020-08-18 ENCOUNTER — Other Ambulatory Visit: Payer: Self-pay

## 2020-08-18 ENCOUNTER — Encounter (HOSPITAL_COMMUNITY)
Admission: RE | Admit: 2020-08-18 | Discharge: 2020-08-18 | Disposition: A | Discharge status: Home / Self Care | Payer: Medicare Other | Source: Ambulatory Visit | Attending: Pulmonary Disease | Admitting: Pulmonary Disease

## 2020-08-18 DIAGNOSIS — R0600 Dyspnea, unspecified: Secondary | ICD-10-CM | POA: Diagnosis not present

## 2020-08-18 NOTE — Progress Notes (Signed)
Daily Session Note  Patient Details  Name: Jacquis Paxton. MRN: [id number] Date of Birth: [birth year] Referring Provider:   April Manson Pulmonary Rehab Walk Test from 06/19/2020 in [place]  Referring Provider Dr. Vaughan Browner      Encounter Date: 08/18/2020  Check In:  Session Check In - 08/18/20 1140      Check-In   Supervising physician immediately available to respond to emergencies [facility] immediately available    Physician(s) Dr. Tawanna Solo    Location [facility]    Staff Present Rosebud Poles, RN, BSN;Kory Panjwani Ysidro Evert, RN;Jessica Hassell Done, MS, ACSM-CEP, Exercise Physiologist    Virtual Visit No    Medication changes reported     No    Fall or balance concerns reported    No    Tobacco Cessation No Change    Warm-up and Cool-down Performed on first and last piece of equipment    Resistance Training Performed Yes    VAD Patient? No    PAD/SET Patient? No      Pain Assessment   Currently in Pain? No/denies    Multiple Pain Sites No           Capillary Blood Glucose: No results found for this or any previous visit (from the past 24 hour(s)).    Social History   Tobacco Use  Smoking Status Former Smoker  . Packs/day: 3.00  . Years: 20.00  . Pack years: 60.00  . Types: Cigarettes  . Quit date: 04/25/1969  . Years since quitting: 51.3  Smokeless Tobacco Former Systems developer  . Types: Chew  . Quit date: 11/07/1983    Goals Met:  See note below  Goals Unmet:  Not Applicable  Comments: Service time is from 093 to 1015 On arrival pt states that he has been having some chest pressure.He denies any at the start of exercise. After starting exercise at 2 minutes he c/o of chest pressure. It resolved with rest and after being moved from equipment. Pt has been to a cardiologist in the past. I have encouraged him to call their office to get an appointment. He is going to notify us when he has an appointment. We instructed him  to not come on Thursday.Gave him instructions in NTG use, calling 911 and coming to the ED.   Dr. Fransico Him is Medical Director for Cardiac Rehab at [facility].

## 2020-08-18 NOTE — Progress Notes (Signed)
Pulmonary Rehab Alex [name] showed for exercise class today and states that he has been having chest pressure. He states that he has been having this for a while and that his physicians have been trying to figure out if this discomfort is related to his lungs or his heart. [name] voices frustration with this pressure. He feels that he is not being heard at the cardiologist and feels that he is being rushed in and out. He states that the pressure occurs only with activity and is relieved with sitting. It may take 5 minutes or up to 25 to go away.He started to exercise today on the nu step and did his 5 minute warm up then 2 minutes into his exercise he c/o of the chest pressure. I took him off of the equipment and as he rested he stated that the discomfort left. I instructed him in the use of his NTG, calling 911 and going to the ED. He voices understanding. [name] is going to call the cardiologist for an appointment. VS stable at discharge room air sat 98%, HR 80 BP 142/80. His graduation date is 4/28 Thursday but he was instructed not to come. He is going to notify us of his appointment date with the cardiologist.

## 2020-08-18 NOTE — Progress Notes (Signed)
Pulmonary Individual Treatment Plan  Patient Details  Name: Alex [name]. MRN: [id number] Date of Birth: 28-Mar-1941 Referring Provider:   April Manson Pulmonary Rehab Walk Test from 06/19/2020 in [place]  Referring Provider Dr. Vaughan Browner      Initial Encounter Date:  Flowsheet Row Pulmonary Rehab Walk Test from 06/19/2020 in [place]  Date 06/19/20      Visit Diagnosis: Dyspnea, unspecified type  Patient's Home Medications on Admission:   Current Outpatient Medications:  .  Ascorbic Acid (VITAMIN C) 1000 MG tablet, Take 1,000 mg by mouth 2 (two) times a day. , Disp: , Rfl:  .  aspirin EC 81 MG tablet, Take 81 mg by mouth daily., Disp: , Rfl:  .  atorvastatin (LIPITOR) 40 MG tablet, Take 40 mg by mouth at bedtime. , Disp: , Rfl:  .  b complex vitamins capsule, Take 1 capsule by mouth daily., Disp: , Rfl:  .  Cholecalciferol 25 MCG (1000 UT) tablet, Take 1,000 Units by mouth 2 (two) times a day. , Disp: , Rfl:  .  ezetimibe (ZETIA) 10 MG tablet, Take 10 mg by mouth daily.  (Patient not taking: Reported on 06/19/2020), Disp: , Rfl:  .  fluticasone (FLONASE) 50 MCG/ACT nasal spray, Place 2 sprays into both nostrils daily., Disp: 16 g, Rfl: 5 .  folic acid (FOLVITE) 1 MG tablet, Take 1 mg by mouth at bedtime. , Disp: , Rfl:  .  furosemide (LASIX) 40 MG tablet, Take 40 mg by mouth daily. , Disp: , Rfl:  .  isosorbide mononitrate (IMDUR) 15 mg TB24 24 hr tablet, Take 15 mg by mouth daily., Disp: , Rfl:  .  Multiple Vitamin (MULTI-VITAMINS) TABS, Take 1 tablet by mouth daily., Disp: , Rfl:  .  nitrofurantoin, macrocrystal-monohydrate, (MACROBID) 100 MG capsule, Take 100 mg by mouth 2 (two) times daily., Disp: , Rfl:  .  nitroGLYCERIN (NITROSTAT) 0.4 MG SL tablet, Place 1 tablet (0.4 mg total) under the tongue every 5 (five) minutes as needed for chest pain., Disp: 25 tablet, Rfl: 3 .  pantoprazole (PROTONIX) 40 MG  tablet, Take 40 mg by mouth daily. , Disp: , Rfl:  .  potassium chloride SA (KLOR-CON) 20 MEQ tablet, Take 20 mEq by mouth daily., Disp: , Rfl:  .  tamsulosin (FLOMAX) 0.4 MG CAPS capsule, Take 1 capsule (0.4 mg total) by mouth daily after supper., Disp: 30 capsule, Rfl: 3 .  triamcinolone cream (KENALOG) 0.1 %, Apply 1 application topically 2 (two) times daily as needed., Disp: , Rfl:  .  vitamin B-12 (CYANOCOBALAMIN) 500 MCG tablet, Take 500 mcg by mouth 2 (two) times daily., Disp: , Rfl:   Past Medical History: Past Medical History:  Diagnosis Date  . AL amyloidosis [facility]) oncologist-- dr [name]   Renal and Systemic , dx 04/ 2018;  renal bx 08-05-2016 and bone marrow bx 08-29-2016;  8 month chemotherapy then stell cell transplant 12/ 2018 then chemo again;  since then been on oral chemo daily (11-07-2019 per pt has been on hold until after prostate seed implants)  . Arthritis   . CAD (coronary artery disease) cardiologist-- dr [name];  also followed by cardiologist w/ [facility] in [place], dr [name]   moderate nonobstrucive disease by cath 2011; Lexiscan Myoview 6/14--Normal study, no scar or ischemia, EF 62%;  11-14-2014 nuclear study low risk w/ normal perfusion, nuclear ef 67%  . CKD (chronic kidney disease), stage III ([place])   .  Diverticulosis of colon   . GERD (gastroesophageal reflux disease)   . H/O stem cell transplant ([place]) 03/2017  . Hiatal hernia   . History of 2019 novel coronavirus disease (COVID-19) 09/2018   [facility], hospital admission 10-28-2019 respiratory failure/ pneumometrostinom/ PE ;  was not intubated , treatment with remdesivir/ steroids/ actemra/ plasma  . History of basal cell carcinoma (BCC)   . History of colon polyps   . History of MRSA infection    culture positive  . History of pulmonary embolus (PE) 10/2018   with covid 19, completed treatment with eliquis  . History of seizures    11-07-2019  per pt last one 1970s, unknown  cause  . History of vertebral compression fracture 1988   lumbar  . Hyperlipidemia   . Hypertension   . Neuropathy due to chemotherapeutic drug (HCC)    fingers tips and feet  . Nocturia   . On supplemental oxygen therapy    post covid 19;   11-07-2019 pt uses O2 every night 2L via [place],  and uses supplements during the day when walking around neighborhood and goes out to appointments/ store that he has to walk a distance  . OSA (obstructive sleep apnea)    study in epic 03-18-2005 mild to moderate   (11-07-2019  per pt used cpap up until when he lost alot of weight, stoped approx. 2012  . Prostate cancer [facility]) urologist--- dr pace/  oncology-- dr Tammi Klippel   dx 03/ 2021  . Pulmonary fibrosis ([place]) 09/2018   pulmonologist-- dr Vaughan Browner;   post covid 19  . SOB (shortness of breath) on exertion    11-07-2019  per pt walks around neighborhood for 30 minutes twice daily, he does have to stop frequently d/t sob and since weather has been hot he uses his supplemental oxygen;  stated he has a pulse ox. at home , when he is sitting down O2 sat 96 -97% on RA when up walking around the house O2 sat 93-94% on RA;  pt stated he carry's his O2 w/ him when he goes to doctor appts/store d/t distance  . Wears glasses   . Wears hearing aid in both ears     Tobacco Use: Social History   Tobacco Use  Smoking Status Former Smoker  . Packs/day: 3.00  . Years: 20.00  . Pack years: 60.00  . Types: Cigarettes  . Quit date: 04/25/1969  . Years since quitting: 51.3  Smokeless Tobacco Former Systems developer  . Types: Chew  . Quit date: 11/07/1983    Labs: Recent Review Flowsheet Data    Labs for ITP Cardiac and Pulmonary Rehab Latest Ref Rng & Units 07/05/2009 07/06/2009 10/29/2018 11/08/2018   Cholestrol 0 - 200 mg/dL - 148 ... - -   LDLCALC 0 - 99 mg/dL - 79 ... - -   HDL >39 mg/dL - 50 - -   Trlycerides <150 mg/dL - 93 103 -   PHART 7.350 - 7.450 - - - 7.528(H)   PCO2ART 32.0 - 48.0 mmHg - - - 27.2(L)   HCO3 20.0 -  28.0 mmol/L - - - 22.7   TCO2 22 - 32 mmol/L 27 - - 24   O2SAT % - - - 90.0      Capillary Blood Glucose: Lab Results  Component Value Date   GLUCAP 113 (H) 11/09/2018   GLUCAP 143 (H) 11/08/2018   GLUCAP 115 (H) 11/08/2018   GLUCAP 153 (H) 11/08/2018  GLUCAP 112 (H) 11/08/2018     Pulmonary Assessment Scores:  Pulmonary Assessment Scores    Row Name 06/19/20 1124         ADL UCSD   ADL Phase Entry     SOB Score total 91           CAT Score   CAT Score 26           mMRC Score   mMRC Score 4           UCSD: Self-administered rating of dyspnea associated with activities of daily living (ADLs) 6-point scale (0 = "not at all" to 5 = "maximal or unable to do because of breathlessness")  Scoring Scores range from 0 to 120.  Minimally important difference is 5 units  CAT: CAT can identify the health impairment of COPD patients and is better correlated with disease progression.  CAT has a scoring range of zero to 40. The CAT score is classified into four groups of low (less than 10), medium (10 - 20), high (21-30) and very high (31-40) based on the impact level of disease on health status. A CAT score over 10 suggests significant symptoms.  A worsening CAT score could be explained by an exacerbation, poor medication adherence, poor inhaler technique, or progression of COPD or comorbid conditions.  CAT MCID is 2 points  mMRC: mMRC (Modified Medical Research Council) Dyspnea Scale is used to assess the degree of baseline functional disability in patients of respiratory disease due to dyspnea. No minimal important difference is established. A decrease in score of 1 point or greater is considered a positive change.   Pulmonary Function Assessment:  Pulmonary Function Assessment - 06/19/20 0952      Breath   Shortness of Breath Limiting activity;Yes           Exercise Target Goals: Exercise Program Goal: Individual exercise prescription set using results from  initial 6 min walk test and THRR while considering  patient's activity barriers and safety.   Exercise Prescription Goal: Initial exercise prescription builds to 30-45 minutes a day of aerobic activity, 2-3 days per week.  Home exercise guidelines will be given to patient during program as part of exercise prescription that the participant will acknowledge.  Activity Barriers & Risk Stratification:  Activity Barriers & Cardiac Risk Stratification - 06/19/20 0949      Activity Barriers & Cardiac Risk Stratification   Activity Barriers Shortness of Breath;Deconditioning;Balance Concerns           6 Minute Walk:  6 Minute Walk    Row Name 06/19/20 1108         6 Minute Walk   Phase Initial     Distance 856 feet     Walk Time 6 minutes     # of Rest Breaks 0     MPH 1.62     METS 1.71     RPE 15     Perceived Dyspnea  3     VO2 Peak 6     Symptoms Yes (comment)     Comments Patient complained of chest tightness that is chronic with exertion and shortness of breath. Pt has had heart cath and echo and everything was fine. Per pt, doctors think chest tightness is from lungs.     Resting HR 71 bpm     Resting BP 132/84     Resting Oxygen Saturation  98 %     Exercise Oxygen Saturation  during 6 min walk  94 %     Max Ex. HR 87 bpm     Max Ex. BP 144/90     2 Minute Post BP 132/90           Interval HR   1 Minute HR 87     2 Minute HR 83     3 Minute HR 83     4 Minute HR 83     5 Minute HR 82     6 Minute HR 84     2 Minute Post HR 75     Interval Heart Rate? Yes           Interval Oxygen   Interval Oxygen? Yes     Baseline Oxygen Saturation % 98 %     1 Minute Oxygen Saturation % 98 %     1 Minute Liters of Oxygen 0 L     2 Minute Oxygen Saturation % 94 %     2 Minute Liters of Oxygen 0 L     3 Minute Oxygen Saturation % 95 %     3 Minute Liters of Oxygen 0 L     4 Minute Oxygen Saturation % 96 %     4 Minute Liters of Oxygen 0 L     5 Minute Oxygen Saturation  % 95 %     5 Minute Liters of Oxygen 0 L     6 Minute Oxygen Saturation % 95 %     6 Minute Liters of Oxygen 0 L     2 Minute Post Oxygen Saturation % 99 %     2 Minute Post Liters of Oxygen 0 L            Oxygen Initial Assessment:  Oxygen Initial Assessment - 06/19/20 0951      Home Oxygen   Home Oxygen Device Home Concentrator    Sleep Oxygen Prescription Continuous    Liters per minute 2    Home Exercise Oxygen Prescription None    Home Resting Oxygen Prescription None    Compliance with Home Oxygen Use Yes      Initial 6 min Walk   Oxygen Used None      Program Oxygen Prescription   Program Oxygen Prescription None      Intervention   Short Term Goals To learn and understand importance of monitoring SPO2 with pulse oximeter and demonstrate accurate use of the pulse oximeter.;To learn and understand importance of maintaining oxygen saturations>88%;To learn and demonstrate proper pursed lip breathing techniques or other breathing techniques.;To learn and demonstrate proper use of respiratory medications;To learn and exhibit compliance with exercise, home and travel O2 prescription    Long  Term Goals Exhibits compliance with exercise, home and travel O2 prescription;Verbalizes importance of monitoring SPO2 with pulse oximeter and return demonstration;Maintenance of O2 saturations>88%;Exhibits proper breathing techniques, such as pursed lip breathing or other method taught during program session;Compliance with respiratory medication;Demonstrates proper use of MDI's           Oxygen Re-Evaluation:  Oxygen Re-Evaluation    Row Name 07/21/20 1541 08/17/20 0934           Program Oxygen Prescription   Program Oxygen Prescription None None             Home Oxygen   Home Oxygen Device Home Concentrator Home Concentrator      Sleep Oxygen Prescription Continuous Continuous      Liters per minute 2 2  Home Exercise Oxygen Prescription None None      Home Resting  Oxygen Prescription None None      Compliance with Home Oxygen Use Yes Yes             Goals/Expected Outcomes   Short Term Goals To learn and understand importance of monitoring SPO2 with pulse oximeter and demonstrate accurate use of the pulse oximeter.;To learn and understand importance of maintaining oxygen saturations>88%;To learn and demonstrate proper pursed lip breathing techniques or other breathing techniques.;To learn and demonstrate proper use of respiratory medications;To learn and exhibit compliance with exercise, home and travel O2 prescription To learn and understand importance of monitoring SPO2 with pulse oximeter and demonstrate accurate use of the pulse oximeter.;To learn and understand importance of maintaining oxygen saturations>88%;To learn and demonstrate proper pursed lip breathing techniques or other breathing techniques.;To learn and demonstrate proper use of respiratory medications;To learn and exhibit compliance with exercise, home and travel O2 prescription      Long  Term Goals Exhibits compliance with exercise, home and travel O2 prescription;Verbalizes importance of monitoring SPO2 with pulse oximeter and return demonstration;Maintenance of O2 saturations>88%;Exhibits proper breathing techniques, such as pursed lip breathing or other method taught during program session;Compliance with respiratory medication;Demonstrates proper use of MDI's Exhibits compliance with exercise, home and travel O2 prescription;Verbalizes importance of monitoring SPO2 with pulse oximeter and return demonstration;Maintenance of O2 saturations>88%;Exhibits proper breathing techniques, such as pursed lip breathing or other method taught during program session;Compliance with respiratory medication;Demonstrates proper use of MDI's      Comments Pt is compliant with night O2 prescription. Want patient to be able to perform pursed lip breathing without cueing and understand when pursed lip breathing  is necessary. Pt is compliant with night O2 prescription. Want patient to be able to perform pursed lip breathing without cueing and understand when pursed lip breathing is necessary.      Goals/Expected Outcomes compliance and understanding of oxygen saturation and pursed lip breathing. For pt to continue monitoring oxygen saturation at home during rest and exertion after completion of rehab. Also for him to continue to use pursed lip breathing when needed.             Oxygen Discharge (Final Oxygen Re-Evaluation):  Oxygen Re-Evaluation - 08/17/20 0934      Program Oxygen Prescription   Program Oxygen Prescription None      Home Oxygen   Home Oxygen Device Home Concentrator    Sleep Oxygen Prescription Continuous    Liters per minute 2    Home Exercise Oxygen Prescription None    Home Resting Oxygen Prescription None    Compliance with Home Oxygen Use Yes      Goals/Expected Outcomes   Short Term Goals To learn and understand importance of monitoring SPO2 with pulse oximeter and demonstrate accurate use of the pulse oximeter.;To learn and understand importance of maintaining oxygen saturations>88%;To learn and demonstrate proper pursed lip breathing techniques or other breathing techniques.;To learn and demonstrate proper use of respiratory medications;To learn and exhibit compliance with exercise, home and travel O2 prescription    Long  Term Goals Exhibits compliance with exercise, home and travel O2 prescription;Verbalizes importance of monitoring SPO2 with pulse oximeter and return demonstration;Maintenance of O2 saturations>88%;Exhibits proper breathing techniques, such as pursed lip breathing or other method taught during program session;Compliance with respiratory medication;Demonstrates proper use of MDI's    Comments Pt is compliant with night O2 prescription. Want patient to be able to perform pursed lip  breathing without cueing and understand when pursed lip breathing is  necessary.    Goals/Expected Outcomes For pt to continue monitoring oxygen saturation at home during rest and exertion after completion of rehab. Also for him to continue to use pursed lip breathing when needed.           Initial Exercise Prescription:  Initial Exercise Prescription - 06/19/20 1100      Date of Initial Exercise RX and Referring Provider   Date 06/19/20    Referring Provider Dr. Vaughan Browner    Expected Discharge Date 08/20/20      NuStep   Level 1    SPM 75    Minutes 15      Track   Minutes 15      Prescription Details   Frequency (times per week) 2    Duration Progress to 30 minutes of continuous aerobic without signs/symptoms of physical distress      Intensity   THRR 40-80% of Max Heartrate 57-114    Ratings of Perceived Exertion 11-13    Perceived Dyspnea 0-4      Progression   Progression Continue to progress workloads to maintain intensity without signs/symptoms of physical distress.      Resistance Training   Training Prescription Yes    Weight Blue bands    Reps 10-15           Perform Capillary Blood Glucose checks as needed.  Exercise Prescription Changes:  Exercise Prescription Changes    Row Name 06/30/20 1500 07/14/20 1500 07/28/20 1500 08/11/20 1500 08/13/20 1500     Response to Exercise   Blood Pressure (Admit) 120/70 132/78 156/80 104/68 --   Blood Pressure (Exercise) 130/80 144/82 124/80 134/80 --   Blood Pressure (Exit) 112/74 136/80 126/74 110/70 --   Heart Rate (Admit) 76 bpm 77 bpm 68 bpm 87 bpm --   Heart Rate (Exercise) 83 bpm 86 bpm 85 bpm 87 bpm --   Heart Rate (Exit) 84 bpm 77 bpm 83 bpm 98 bpm --   Oxygen Saturation (Admit) 96 % 94 % 96 % 98 % --   Oxygen Saturation (Exercise) 92 % 94 % 94 % 96 % --   Oxygen Saturation (Exit) 97 % 95 % 97 % 98 % --   Rating of Perceived Exertion (Exercise) _0 --   Perceived Dyspnea (Exercise) _1 --   Duration Progress to 30 minutes of  aerobic without signs/symptoms of  physical distress Continue with 30 min of aerobic exercise without signs/symptoms of physical distress. Continue with 30 min of aerobic exercise without signs/symptoms of physical distress. Continue with 30 min of aerobic exercise without signs/symptoms of physical distress. --   Intensity Other (comment)  40-80% of HRR THRR unchanged THRR unchanged THRR unchanged --     Progression   Progression Continue to progress workloads to maintain intensity without signs/symptoms of physical distress. Continue to progress workloads to maintain intensity without signs/symptoms of physical distress. Continue to progress workloads to maintain intensity without signs/symptoms of physical distress. Continue to progress workloads to maintain intensity without signs/symptoms of physical distress. --     Horticulturist, commercial Prescription Yes Yes Yes Yes --   Weight blue bands blue bands blue bands blue bands --   Reps 10-15 10-15 10-15 10-15 --   Time 10 Minutes 10 Minutes 10 Minutes 10 Minutes --     NuStep   Level _2 --  SPM 80 80 80 80 --   Minutes _0 --   METs 2.2 2.4 2.9 2.9 --     Track   Laps _1 --   Minutes _2 --   METs -- 2.86 2.86 -- --     Home Exercise Plan   Plans to continue exercise at -- -- -- -- Home (comment)  Walking and resistance training with bands   Frequency -- -- -- -- Add 3 additional days to program exercise sessions.   Initial Home Exercises Provided -- -- -- -- 08/13/20          Exercise Comments:  Exercise Comments    Row Name 06/23/20 1634 08/13/20 1527         Exercise Comments Patient completed first day of exercise and tolerated well with no complaints or concerns. He was able to do 15 minutes on the Nustep with no rest breaks. He also walked 15 minutes without having to rest. We have him walking the track with a rolator because he says he has one at home that he uses for long distance. If we see that he does not need  it at some point we will cut it out. His balance seems good for short distances but he becomes fatigue with longer distance. He was also able to do all resistance exercises with no issues. Completed home exercise prescription with pt. Pt is currently walking at home nearly every day. Encouraged continued use of walking and to also add in resistance training with bands 2-3 days per week. Discussed ways to progress home exercise. Pt was receptive. Will continue to monitor and follow home exercise prescription.             Exercise Goals and Review:  Exercise Goals    Row Name 06/19/20 1043             Exercise Goals   Increase Physical Activity Yes       Intervention Provide advice, education, support and counseling about physical activity/exercise needs.;Develop an individualized exercise prescription for aerobic and resistive training based on initial evaluation findings, risk stratification, comorbidities and participant's personal goals.       Expected Outcomes Short Term: Attend rehab on a regular basis to increase amount of physical activity.;Long Term: Add in home exercise to make exercise part of routine and to increase amount of physical activity.;Long Term: Exercising regularly at least 3-5 days a week.       Increase Strength and Stamina Yes       Intervention Provide advice, education, support and counseling about physical activity/exercise needs.;Develop an individualized exercise prescription for aerobic and resistive training based on initial evaluation findings, risk stratification, comorbidities and participant's personal goals.       Expected Outcomes Short Term: Increase workloads from initial exercise prescription for resistance, speed, and METs.;Short Term: Perform resistance training exercises routinely during rehab and add in resistance training at home;Long Term: Improve cardiorespiratory fitness, muscular endurance and strength as measured by increased METs and functional  capacity (6MWT)       Able to understand and use rate of perceived exertion (RPE) scale Yes       Intervention Provide education and explanation on how to use RPE scale       Expected Outcomes Short Term: Able to use RPE daily in rehab to express subjective intensity level;Long Term:  Able to use RPE to guide intensity level when exercising independently  Able to understand and use Dyspnea scale Yes       Intervention Provide education and explanation on how to use Dyspnea scale       Expected Outcomes Short Term: Able to use Dyspnea scale daily in rehab to express subjective sense of shortness of breath during exertion;Long Term: Able to use Dyspnea scale to guide intensity level when exercising independently       Knowledge and understanding of Target Heart Rate Range (THRR) Yes       Intervention Provide education and explanation of THRR including how the numbers were predicted and where they are located for reference       Expected Outcomes Short Term: Able to state/look up THRR;Long Term: Able to use THRR to govern intensity when exercising independently;Short Term: Able to use daily as guideline for intensity in rehab       Understanding of Exercise Prescription Yes       Intervention Provide education, explanation, and written materials on patient's individual exercise prescription       Expected Outcomes Short Term: Able to explain program exercise prescription;Long Term: Able to explain home exercise prescription to exercise independently              Exercise Goals Re-Evaluation :  Exercise Goals Re-Evaluation    [place] Name 07/21/20 1538 08/17/20 0930           Exercise Goal Re-Evaluation   Exercise Goals Review Increase Physical Activity;Increase Strength and Stamina;Able to understand and use rate of perceived exertion (RPE) scale;Able to understand and use Dyspnea scale;Knowledge and understanding of Target Heart Rate Range (THRR);Understanding of Exercise Prescription  Increase Physical Activity;Increase Strength and Stamina;Able to understand and use rate of perceived exertion (RPE) scale;Able to understand and use Dyspnea scale;Knowledge and understanding of Target Heart Rate Range (THRR);Understanding of Exercise Prescription      Comments Patient has completed 9 exercise sessions and has been steady with making progressions with workload increases and MET increases. He is independent with resistance training and stretches. He is exercising at 2.8 METS on level 4 on the Nustep and 3.32 METS walking the track. Will continue to monitor and progress as he is able. Will also be discussing home exercise with patient in upcoming weeks. Buddy has completed 16 exercise sessions and has been with workload and MET increases. He has made great improvements in the program and he states he can tell that exercise has made a difference. He is scheduled to graduate 08/20/20 and will discuss exercise prescription for after completion of program. He is exercising at 3.7 METS on the Nustep and 3.09 METS walking the track. Will continue to monitor.      Expected Outcomes Through exercise at rehab and home, the patient will decrease shortness of breath with daily activities and feel confident in carrying out an exercise regimn at home. For pt to continue exercise at home or local fitness center after completion of pulmonary rehab and for him to continue to decrease shortness of breath with daily activities.             Discharge Exercise Prescription (Final Exercise Prescription Changes):  Exercise Prescription Changes - 08/13/20 1500      Home Exercise Plan   Plans to continue exercise at Home (comment)   Walking and resistance training with bands   Frequency Add 3 additional days to program exercise sessions.    Initial Home Exercises Provided 08/13/20           Nutrition:  Target Goals: Understanding of nutrition guidelines, daily intake of sodium <1576m, cholesterol <2012m  calories 30% from fat and 7% or less from saturated fats, daily to have 5 or more servings of fruits and vegetables.  Biometrics:  Pre Biometrics - 06/19/20 1044      Pre Biometrics   Grip Strength 36 kg            Nutrition Therapy Plan and Nutrition Goals:  Nutrition Therapy & Goals - 07/09/20 1443      Nutrition Therapy   Diet TLC    Drug/Food Interactions Statins/Certain Fruits      Personal Nutrition Goals   Nutrition Goal Pt to build a healthy plate including vegetables, fruits, whole grains, and low-fat dairy products in a heart healthy meal plan.      Intervention Plan   Intervention Prescribe, educate and counsel regarding individualized specific dietary modifications aiming towards targeted core components such as weight, hypertension, lipid management, diabetes, heart failure and other comorbidities.    Expected Outcomes Short Term Goal: Understand basic principles of dietary content, such as calories, fat, sodium, cholesterol and nutrients.           Nutrition Assessments:  MEDIFICTS Score Key:  ?70 Need to make dietary changes   40-70 Heart Healthy Diet  ? 40 Therapeutic Level Cholesterol Diet  Flowsheet Row PULMONARY REHAB OTHER RESPIRATORY [facility]09/2020 [facility] Total Score on Admission 64     Picture Your Plate Scores:  <4<45nhealthy dietary pattern with much room for improvement.  41-50 Dietary pattern unlikely to meet recommendations for good health and room for improvement.  51-60 More healthful dietary pattern, with some room for improvement.   >60 Healthy dietary pattern, although there may be some specific behaviors that could be improved.    Nutrition Goals Re-Evaluation:  Nutrition Goals Re-Evaluation    Ro[place]ame 07/09/20 1443 07/15/20 1524 08/14/20 0953         Goals   Current Weight 201 lb (91.2 kg) 202 lb 2.6 oz (91.7 kg) 194 lb 7.1 oz (88.2 kg)     Nutrition Goal -- Pt  to build a healthy plate including vegetables, fruits, whole grains, and low-fat dairy products in a heart healthy meal plan. Pt to build a healthy plate including vegetables, fruits, whole grains, and low-fat dairy products in a heart healthy meal plan.     Comment -- -- Pt has lost 5.5 lbs since starting pulmonary rehab at 199.5 lbs            Nutrition Goals Discharge (Final Nutrition Goals Re-Evaluation):  Nutrition Goals Re-Evaluation - 08/14/20 0953      Goals   Current Weight 194 lb 7.1 oz (88.2 kg)    Nutrition Goal Pt to build a healthy plate including vegetables, fruits, whole grains, and low-fat dairy products in a heart healthy meal plan.    Comment Pt has lost 5.5 lbs since starting pulmonary rehab at 199.5 lbs           Psychosocial: Target Goals: Acknowledge presence or absence of significant depression and/or stress, maximize coping skills, provide positive support system. Participant is able to verbalize types and ability to use techniques and skills needed for reducing stress and depression.  Initial Review & Psychosocial Screening:  Initial Psych Review & Screening - 06/19/20 0953      Initial Review   Current issues with None Identified      Family Dynamics  Good Support System? Yes   Wife is very supportive and takes good care of him     Barriers   Psychosocial barriers to participate in program The patient should benefit from training in stress management and relaxation.      Screening Interventions   Interventions Encouraged to exercise           Quality of Life Scores:  Scores of 19 and below usually indicate a poorer quality of life in these areas.  A difference of  2-3 points is a clinically meaningful difference.  A difference of 2-3 points in the total score of the Quality of Life Index has been associated with significant improvement in overall quality of life, self-image, physical symptoms, and general health in studies assessing change in  quality of life.  PHQ-9: Recent Review Flowsheet Data    Depression screen [facility] 2/9 06/19/2020   Decreased Interest 0   Down, Depressed, Hopeless 0   PHQ - 2 Score 0   Altered sleeping 0   Tired, decreased energy 3   Change in appetite 0   Feeling bad or failure about yourself  0   Trouble concentrating 1   Moving slowly or fidgety/restless 0   Suicidal thoughts 0   Difficult doing work/chores Somewhat difficult     Interpretation of Total Score  Total Score Depression Severity:  1-4 = Minimal depression, 5-9 = Mild depression, 10-14 = Moderate depression, 15-19 = Moderately severe depression, 20-27 = Severe depression   Psychosocial Evaluation and Intervention:   Psychosocial Re-Evaluation:  Psychosocial Re-Evaluation    Row Name 06/22/20 1405 07/20/20 1355 08/13/20 0846         Psychosocial Re-Evaluation   Current issues with None Identified None Identified None Identified     Comments No change in psychosocial assessment since orientation/walk test.  Solly will begin exercising on 06/23/2020. No concerns isentified at this time. No psychosocial concerns identified at this time.     Expected Outcomes For Lenvil to be free of psychosocial concerns while particpating in pulmonary rehab. For "Buddy" to continue to be free of psychsocial concerns while participating in pulmonary rehab. For "Buddy" to continue to be free of psychosocial concerns while particpating in pulmonary rehab.     Interventions Encouraged to attend Pulmonary Rehabilitation for the exercise Encouraged to attend Pulmonary Rehabilitation for the exercise Encouraged to attend Pulmonary Rehabilitation for the exercise     Continue Psychosocial Services  No Follow up required No Follow up required No Follow up required            Psychosocial Discharge (Final Psychosocial Re-Evaluation):  Psychosocial Re-Evaluation - 08/13/20 0846      Psychosocial Re-Evaluation   Current issues with None Identified     Comments No psychosocial concerns identified at this time.    Expected Outcomes For "Buddy" to continue to be free of psychosocial concerns while particpating in pulmonary rehab.    Interventions Encouraged to attend Pulmonary Rehabilitation for the exercise    Continue Psychosocial Services  No Follow up required           Education: Education Goals: Education classes will be provided on a weekly basis, covering required topics. Participant will state understanding/return demonstration of topics presented.  Learning Barriers/Preferences:  Learning Barriers/Preferences - 06/19/20 0955      Learning Barriers/Preferences   Learning Barriers Hearing   Has (R) and (L) hearing aids   Learning Preferences Skilled Demonstration;Individual Instruction  Education Topics: Risk Factor Reduction:  -Group instruction that is supported by a PowerPoint presentation. Instructor discusses the definition of a risk factor, different risk factors for pulmonary disease, and how the heart and lungs work together.     Nutrition for Pulmonary Patient:  -Group instruction provided by PowerPoint slides, verbal discussion, and written materials to support subject matter. The instructor gives an explanation and review of healthy diet recommendations, which includes a discussion on weight management, recommendations for fruit and vegetable consumption, as well as protein, fluid, caffeine, fiber, sodium, sugar, and alcohol. Tips for eating when patients are short of breath are discussed.   Pursed Lip Breathing:  -Group instruction that is supported by demonstration and informational handouts. Instructor discusses the benefits of pursed lip and diaphragmatic breathing and detailed demonstration on how to preform both.   Flowsheet Row PULMONARY REHAB OTHER RESPIRATORY from 08/13/2020 in [place]  Date 07/30/20  Educator Handout      Oxygen Safety:  -Group  instruction provided by PowerPoint, verbal discussion, and written material to support subject matter. There is an overview of "What is Oxygen" and "Why do we need it".  Instructor also reviews how to create a safe environment for oxygen use, the importance of using oxygen as prescribed, and the risks of noncompliance. There is a brief discussion on traveling with oxygen and resources the patient may utilize. Flowsheet Row PULMONARY REHAB OTHER RESPIRATORY from 08/13/2020 in [place]  Date 06/25/20  Educator handout      Oxygen Equipment:  -Group instruction provided by [facility] Staff utilizing handouts, written materials, and equipment demonstrations.   Signs and Symptoms:  -Group instruction provided by written material and verbal discussion to support subject matter. Warning signs and symptoms of infection, stroke, and heart attack are reviewed and when to call the physician/911 reinforced. Tips for preventing the spread of infection discussed.   Advanced Directives:  -Group instruction provided by verbal instruction and written material to support subject matter. Instructor reviews Advanced Directive laws and proper instruction for filling out document.   Pulmonary Video:  -Group video education that reviews the importance of medication and oxygen compliance, exercise, good nutrition, pulmonary hygiene, and pursed lip and diaphragmatic breathing for the pulmonary patient.   Exercise for the Pulmonary Patient:  -Group instruction that is supported by a PowerPoint presentation. Instructor discusses benefits of exercise, core components of exercise, frequency, duration, and intensity of an exercise routine, importance of utilizing pulse oximetry during exercise, safety while exercising, and options of places to exercise outside of rehab.     Pulmonary Medications:  -Verbally interactive group education provided by instructor with focus on inhaled  medications and proper administration.   Anatomy and Physiology of the Respiratory System and Intimacy:  -Group instruction provided by PowerPoint, verbal discussion, and written material to support subject matter. Instructor reviews respiratory cycle and anatomical components of the respiratory system and their functions. Instructor also reviews differences in obstructive and restrictive respiratory diseases with examples of each. Intimacy, Sex, and Sexuality differences are reviewed with a discussion on how relationships can change when diagnosed with pulmonary disease. Common sexual concerns are reviewed. Flowsheet Row PULMONARY REHAB OTHER RESPIRATORY from 08/13/2020 in [place]  Date 07/02/20  Educator Handout      MD DAY -A group question and answer session with a medical doctor that allows participants to ask questions that relate to their pulmonary disease state.  OTHER EDUCATION -Group or individual verbal, written, or video instructions that support the educational goals of the pulmonary rehab program. [place] from 08/13/2020 in [place]  Date 08/13/20  [Beat a Sedentary Lifestyle]  Educator handout  [Rate my plate]      Holiday Eating Survival Tips:  -Group instruction provided by PowerPoint slides, verbal discussion, and written materials to support subject matter. The instructor gives patients tips, tricks, and techniques to help them not only survive but enjoy the holidays despite the onslaught of food that accompanies the holidays.   Knowledge Questionnaire Score:  Knowledge Questionnaire Score - 06/19/20 1123      Knowledge Questionnaire Score   Pre Score 13/18           Core Components/Risk Factors/Patient Goals at Admission:  Personal Goals and Risk Factors at Admission - 06/19/20 1045      Core Components/Risk Factors/Patient Goals on Admission    Improve shortness of breath with ADL's Yes    Intervention Provide education, individualized exercise plan and daily activity instruction to help decrease symptoms of SOB with activities of daily living.    Expected Outcomes Short Term: Improve cardiorespiratory fitness to achieve a reduction of symptoms when performing ADLs;Long Term: Be able to perform more ADLs without symptoms or delay the onset of symptoms    Hypertension Yes    Intervention Provide education on lifestyle modifcations including regular physical activity/exercise, weight management, moderate sodium restriction and increased consumption of fresh fruit, vegetables, and low fat dairy, alcohol moderation, and smoking cessation.    Expected Outcomes Long Term: Maintenance of blood pressure at goal levels.           Core Components/Risk Factors/Patient Goals Review:   Goals and Risk Factor Review    Row Name 06/22/20 1407 07/20/20 1356 08/13/20 0847         Core Components/Risk Factors/Patient Goals Review   Personal Goals Review Develop more efficient breathing techniques such as purse lipped breathing and diaphragmatic breathing and practicing self-pacing with activity.;Increase knowledge of respiratory medications and ability to use respiratory devices properly.;Improve shortness of breath with ADL's Develop more efficient breathing techniques such as purse lipped breathing and diaphragmatic breathing and practicing self-pacing with activity.;Increase knowledge of respiratory medications and ability to use respiratory devices properly.;Improve shortness of breath with ADL's Develop more efficient breathing techniques such as purse lipped breathing and diaphragmatic breathing and practicing self-pacing with activity.;Increase knowledge of respiratory medications and ability to use respiratory devices properly.;Improve shortness of breath with ADL's     Review Sabastian will begin exercising in pulmonary rehab 06/23/2020. Buddy is  progressing well despite his shortness of breath.  Since he had covid-19 1 year ago he struggles with shortness of breath with any activity. He is exercising @ 2.5 mets on the nustep and walking 16 laps on the track in 15 minutes. "Buddy" is limited by his shortness of breath which developed after he had covid 1 1/2 years ago.  Some days are better than others for him, he has been bothered by seasonal allergies and has made it difficult for him to breathe recently.He has slowly.     Expected Outcomes See admission goals. -- See admission goals.            Core Components/Risk Factors/Patient Goals at Discharge (Final Review):   Goals and Risk Factor Review - 08/13/20 0847      Core Components/Risk Factors/Patient Goals Review   Personal Goals  Review Develop more efficient breathing techniques such as purse lipped breathing and diaphragmatic breathing and practicing self-pacing with activity.;Increase knowledge of respiratory medications and ability to use respiratory devices properly.;Improve shortness of breath with ADL's    Review "Buddy" is limited by his shortness of breath which developed after he had covid 1 1/2 years ago.  Some days are better than others for him, he has been bothered by seasonal allergies and has made it difficult for him to breathe recently.He has slowly.    Expected Outcomes See admission goals.           ITP Comments:   Comments: ITP REVIEW Pt is making expected progress toward pulmonary rehab goals after completing 16 sessions. Recommend continued exercise, life style modification, education, and utilization of breathing techniques to increase stamina and strength and decrease shortness of breath with exertion.

## 2020-08-20 ENCOUNTER — Encounter (HOSPITAL_COMMUNITY): Payer: Medicare Other

## 2020-08-24 ENCOUNTER — Other Ambulatory Visit: Payer: Self-pay

## 2020-08-24 ENCOUNTER — Inpatient Hospital Stay: Payer: Medicare Other | Attending: Hematology

## 2020-08-24 DIAGNOSIS — N049 Nephrotic syndrome with unspecified morphologic changes: Secondary | ICD-10-CM | POA: Insufficient documentation

## 2020-08-24 DIAGNOSIS — Z79899 Other long term (current) drug therapy: Secondary | ICD-10-CM | POA: Insufficient documentation

## 2020-08-24 DIAGNOSIS — E859 Amyloidosis, unspecified: Secondary | ICD-10-CM | POA: Diagnosis not present

## 2020-08-24 DIAGNOSIS — E8581 Light chain (AL) amyloidosis: Secondary | ICD-10-CM

## 2020-08-24 DIAGNOSIS — C61 Malignant neoplasm of prostate: Secondary | ICD-10-CM | POA: Insufficient documentation

## 2020-08-24 LAB — CBC WITH DIFFERENTIAL/PLATELET
Abs Immature Granulocytes: 0.02 10*3/uL (ref 0.00–0.07)
Basophils Absolute: 0 10*3/uL (ref 0.0–0.1)
Basophils Relative: 0 %
Eosinophils Absolute: 0.2 10*3/uL (ref 0.0–0.5)
Eosinophils Relative: 3 %
HCT: 41.7 % (ref 39.0–52.0)
Hemoglobin: 14.5 g/dL (ref 13.0–17.0)
Immature Granulocytes: 0 %
Lymphocytes Relative: 17 %
Lymphs Abs: 1.5 10*3/uL (ref 0.7–4.0)
MCH: 33 pg (ref 26.0–34.0)
MCHC: 34.8 g/dL (ref 30.0–36.0)
MCV: 94.8 fL (ref 80.0–100.0)
Monocytes Absolute: 0.7 10*3/uL (ref 0.1–1.0)
Monocytes Relative: 8 %
Neutro Abs: 6.1 10*3/uL (ref 1.7–7.7)
Neutrophils Relative %: 72 %
Platelets: 168 10*3/uL (ref 150–400)
RBC: 4.4 MIL/uL (ref 4.22–5.81)
RDW: 12.3 % (ref 11.5–15.5)
WBC: 8.5 10*3/uL (ref 4.0–10.5)
nRBC: 0 % (ref 0.0–0.2)

## 2020-08-24 LAB — CMP (CANCER CENTER ONLY)
ALT: 47 U/L — ABNORMAL HIGH (ref 0–44)
AST: 39 U/L (ref 15–41)
Albumin: 3.3 g/dL — ABNORMAL LOW (ref 3.5–5.0)
Alkaline Phosphatase: 115 U/L (ref 38–126)
Anion gap: 10 (ref 5–15)
BUN: 20 mg/dL (ref 8–23)
CO2: 27 mmol/L (ref 22–32)
Calcium: 9.3 mg/dL (ref 8.9–10.3)
Chloride: 104 mmol/L (ref 98–111)
Creatinine: 1.14 mg/dL (ref 0.61–1.24)
GFR, Estimated: >60 mL/min (ref >60)
Glucose, Bld: 98 mg/dL (ref 70–99)
Potassium: 3.6 mmol/L (ref 3.5–5.1)
Sodium: 141 mmol/L (ref 135–145)
Total Bilirubin: 1 mg/dL (ref 0.3–1.2)
Total Protein: 7.3 g/dL (ref 6.5–8.1)

## 2020-08-25 LAB — KAPPA/LAMBDA LIGHT CHAINS
Kappa free light chain: 23.8 mg/L — ABNORMAL HIGH (ref 3.3–19.4)
Kappa, lambda light chain ratio: 1.06 (ref 0.26–1.65)
Lambda free light chains: 22.5 mg/L (ref 5.7–26.3)

## 2020-08-27 LAB — MULTIPLE MYELOMA PANEL, SERUM
Albumin SerPl Elph-Mcnc: 3.2 g/dL (ref 2.9–4.4)
Albumin/Glob SerPl: 1.1 (ref 0.7–1.7)
Alpha 1: 0.3 g/dL (ref 0.0–0.4)
Alpha2 Glob SerPl Elph-Mcnc: 1.1 g/dL — ABNORMAL HIGH (ref 0.4–1.0)
B-Globulin SerPl Elph-Mcnc: 0.9 g/dL (ref 0.7–1.3)
Gamma Glob SerPl Elph-Mcnc: 0.9 g/dL (ref 0.4–1.8)
Globulin, Total: 3.2 g/dL (ref 2.2–3.9)
IgA: 177 mg/dL (ref 61–437)
IgG (Immunoglobin G), Serum: 994 mg/dL (ref 603–1613)
IgM (Immunoglobulin M), Srm: 70 mg/dL (ref 15–143)
Total Protein ELP: 6.4 g/dL (ref 6.0–8.5)

## 2020-08-29 NOTE — Progress Notes (Signed)
HEMATOLOGY/ONCOLOGY CLINIC NOTE  Date of Service: 08/31/20   Patient Care Team: Ginger Organ., MD as PCP - General (Internal Medicine) Burnell Blanks, MD as PCP - Cardiology (Cardiology) Brunetta Genera, MD as Consulting Physician (Hematology) Nephrology - Dr Madelon Lips MD Dr Henrene Pastor MD - GI  CHIEF COMPLAINTS/PURPOSE OF CONSULTATION:  AL Amyloidosis -with Nephrotic syndrome . No overt CHF pEF  HISTORY OF PRESENTING ILLNESS:   Alex [name]. is a wonderful 80 y.o. male who has been referred to [place] by Dr .[name], Emily Filbert., MD /Dr Madelon Lips MD for evaluation and management of newly diagnosed Kidney AL Amyloidosis with nephrotic syndrome.  Patient has a history of hypertension, dyslipidemia, coronary artery disease, seizure disorder on Dilantin who was apparently in his usual state of health until 3 months ago when he started developing new onset lower extremity edema. Patient had a UA at the time that showed 4+ protein in 24-hour collection revealed 15 g of protein. Additional workup showed a creatinine of 0.8 with an albumin of 2.3 and negative SPEP and UPEP. Apparently had an elevated K/L SFLC ratio of 5.19.  He was urgently referred by his primary care physician to nephrology for additional evaluation. Patient notes no bleeding issues. No nosebleeds. No periorbital bleeding. No abnormal skin rashes. Has had significant NSAID exposure and use to take a fair amount of naproxen for chronic low back pain but has cut down on this.  Due to lack of clear etiology for his nephrotic syndrome the patient underwent a kidney biopsy on 08/06/2015 accession [id number] which showed AL amyloidosis, lambda immunophenotype, [place] red positive. Noted to have severe arteriosclerosis with moderate tubular interstitial scarring.  Patient was referred to [place] for further evaluation and treatment of his AL Amyloidosis, Patient notes that his leg swelling has improved  with diuretic therapy.  He notes no weight loss. No night sweats. No focal bone pains. No skin rashes. Lungs normal bleeding or bruising.  Patient notes that he had a lot of reading online and he and his wife had an extensive list of questions which were answered in detail.  He notes he has had some chronic issues with upper abdominal pain and nausea and that he follows with Dr. Henrene Pastor and is apparently being scheduled for an EGD and colonoscopy according to his report.  Denies having and lacks tongue. No chest pain and no overt new shortness of breath. Patient is uncertain if an echocardiogram has been ordered or scheduled.  INTERVAL HISTORY: Alex [name] is here for a scheduled follow-up for his AL amyloidosis. We are joined today by his wife, [name]. The patient's last visit with [place] was on 04/13/2020.The pt reports that he is doing well overall.  The pt reports that he feels very stable over the last six months. The pt notes his veins have been more prominent over the last few weeks. 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 He is currently on three pills daily in his taper that started with five daily. The pt notes his headaches keep him awake at night. The pt notes his breathing and lungs have been very stable, not worsened or improved. He had pulmonary rehab for two months, but continues to still deal with it. The pt notes that he also recently had a bout with seasonal allergies due to pollen prior to the arteritis. The pt notes his  oxygen levels stay at mid 90s when walking and he does not need any oxygen. The pt notes he has to stop frequently. The pt notes that he cannot get his CPAP mask to fit properly and has thus been experiencing difficulty sleeping. The pt notes that he had a physical exam and had stool studies that showed he had trace blood in the stools. The pt notes this is not visible nor did he know about  it.  Lab results 08/24/2020 of CBC w/diff and CMP is as follows: all values are WNL except for Albumin of 3.3, ALT of 47. 08/24/2020 Kappa Free light chain of 23.8. 08/24/2020 MMP WNL except Alpha 2 Glob of 1.1.  On review of systems, pt reports giant cell artiritis, headaches, difficulty sleeping, decreased appetite, weight loss and denies changes in bowel habits, depression, fatigue, abdominal pain, and any other symptoms.  MEDICAL HISTORY:  Past Medical History:  Diagnosis Date  . AL amyloidosis [facility]) oncologist-- dr [name]   Renal and Systemic , dx 04/ 2018;  renal bx 08-05-2016 and bone marrow bx 08-29-2016;  8 month chemotherapy then stell cell transplant 12/ 2018 then chemo again;  since then been on oral chemo daily (11-07-2019 per pt has been on hold until after prostate seed implants)  . Arthritis   . CAD (coronary artery disease) cardiologist-- dr [name];  also followed by cardiologist w/ [facility] in [place], dr [name]   moderate nonobstrucive disease by cath 2011; Lexiscan Myoview 6/14--Normal study, no scar or ischemia, EF 62%;  11-14-2014 nuclear study low risk w/ normal perfusion, nuclear ef 67%  . CKD (chronic kidney disease), stage III ([place])   . Diverticulosis of colon   . GERD (gastroesophageal reflux disease)   . H/O stem cell transplant ([place]) 03/2017  . Hiatal hernia   . History of 2019 novel coronavirus disease (COVID-19) 09/2018   [facility], hospital admission 10-28-2019 respiratory failure/ pneumometrostinom/ PE ;  was not intubated , treatment with remdesivir/ steroids/ actemra/ plasma  . History of basal cell carcinoma (BCC)   . History of colon polyps   . History of MRSA infection    culture positive  . History of pulmonary embolus (PE) 10/2018   with covid 19, completed treatment with eliquis  . History of seizures    11-07-2019  per pt last one 1970s, unknown cause  . History of vertebral compression fracture 1988   lumbar  .  Hyperlipidemia   . Hypertension   . Neuropathy due to chemotherapeutic drug (HCC)    fingers tips and feet  . Nocturia   . On supplemental oxygen therapy    post covid 19;   11-07-2019 pt uses O2 every night 2L via [place],  and uses supplements during the day when walking around neighborhood and goes out to appointments/ store that he has to walk a distance  . OSA (obstructive sleep apnea)    study in epic 03-18-2005 mild to moderate   (11-07-2019  per pt used cpap up until when he lost alot of weight, stoped approx. 2012  . Prostate cancer [facility]) urologist--- dr pace/  oncology-- dr Tammi Klippel   dx 03/ 2021  . Pulmonary fibrosis ([place]) 09/2018   pulmonologist-- dr Vaughan Browner;   post covid 19  . SOB (shortness of breath) on exertion    11-07-2019  per pt walks around neighborhood for 30 minutes twice daily, he does have to stop frequently d/t sob and since weather has been hot  he uses his supplemental oxygen;  stated he has a pulse ox. at home , when he is sitting down O2 sat 96 -97% on RA when up walking around the house O2 sat 93-94% on RA;  pt stated he carry's his O2 w/ him when he goes to doctor appts/store d/t distance  . Wears glasses   . Wears hearing aid in both ears   Obstructive sleep apnea Gastroesophageal reflux disease Aortic stenosis Erectile dysfunction Peripheral neuropathy Seizure disorder   SURGICAL HISTORY: Past Surgical History:  Procedure Laterality Date  . CARDIAC CATHETERIZATION  05-21-1999  and 12-29-2000  '@[facility]'   dr Maryjo [place] nuclear study;  moderate nonobstructive disease involving LAD and LCx;  second done due to in Reversal research trial  . CARDIAC CATHETERIZATION  07/07/2009  dr [name]   nonobstructive disease w/ normal LVF  . CARPAL TUNNEL RELEASE Left 1994  . CERVICAL FUSION  1988   C2 -- 4  . CYSTOSCOPY N/A 11/13/2019   Procedure: CYSTOSCOPY FLEXIBLE;  Surgeon: [name], MD;  Location: [facility];  Service: Urology;  Laterality:  N/A;  no seeds detected per Dr. Claudia Desanctis  . FOOT SURGERY Bilateral x3 last one 1980s   bunionectomy great toe--- x2 right and x1 left  . KNEE ARTHROSCOPY Left 1996  . NASAL SEPTUM SURGERY  x3 last one 08-09-2010 @ [facility]  . PAROTIDECTOMY Right 03/09/2017  . RADIOACTIVE SEED IMPLANT N/A 11/13/2019   Procedure: RADIOACTIVE SEED IMPLANT/BRACHYTHERAPY IMPLANT;  Surgeon: [name], MD;  Location: [facility];  Service: Urology;  Laterality: N/A;  ONLY NEEDS 90 MIN FOR ALL PROCEDURES  . ROTATOR CUFF REPAIR Bilateral x1 rigth (unsure date);  x2 left last one 09-07-2009 '@[facility]' ;  right  . SPACE OAR INSTILLATION N/A 11/13/2019   Procedure: SPACE OAR INSTILLATION;  Surgeon: [name], MD;  Location[facility];  Service: Urology;  Laterality: N/A;  . TRIGGER FINGER RELEASE Bilateral x2  last one 2001 approx.   x1 finger left hand;  x2 fingers right hand  . UMBILICAL HERNIA REPAIR  01/19/2011  . VASECTOMY  approx. 53 (with anesthesia)    SOCIAL HISTORY: Social History   Socioeconomic History  . Marital status: Married    Spouse name: Not on file  . Number of children: 2  . Years of education: 1  . Highest education level: Not on file  Occupational History  . Occupation: Retired  Tobacco Use  . Smoking status: Former Smoker    Packs/day: 3.00    Years: 20.00    Pack years: 60.00    Types: Cigarettes    Quit date: 04/25/1969    Years since quitting: 51.3  . Smokeless tobacco: Former Systems developer    Types: Avis date: 11/07/1983  Vaping Use  . Vaping Use: Never used  Substance and Sexual Activity  . Alcohol use: Yes    Comment: occasional  . Drug use: Never  . Sexual activity: Not Currently    Comment: SHIM 1. Reports erectile dysfunction.;    Other Topics Concern  . Not on file  Social History Narrative   ** Merged History Encounter **   Lives at home w/ his wife   Right-handed   Caffeine: occasional Pepsi       Social Determinants of Health    Financial Resource Strain: Not on file  Food Insecurity: Not on file  Transportation Needs: Not on file  Physical Activity: Not on file  Stress: Not on file  Social Connections: Not on file  Intimate Partner Violence: Not on file  Patient is currently retired.   FAMILY HISTORY: Family History  Problem Relation Age of Onset  . Diabetes Father   . Heart disease Father   . Heart attack Father 87  . Hypertension Father   . Hearing loss Mother   . Eczema Sister   . Cancer Sister        1 sister had cancer but patient unsure of type  . Hyperlipidemia Other   . Diabetes Other   . Hypertension Other   . Seizures Other   . Thyroid disease Other   . Colon cancer Neg Hx   . Stroke Neg Hx   . Breast cancer Neg Hx   . Pancreatic cancer Neg Hx   . Prostate cancer Neg Hx     ALLERGIES:  is allergic to ramipril.  MEDICATIONS:  Current Outpatient Medications  Medication Sig Dispense Refill  . Ascorbic Acid (VITAMIN C) 1000 MG tablet Take 1,000 mg by mouth 2 (two) times a day.     [name] Kitchen aspirin EC 81 MG tablet Take 81 mg by mouth daily.    [name] Kitchen atorvastatin (LIPITOR) 40 MG tablet Take 40 mg by mouth at bedtime.     [name] Kitchen b complex vitamins capsule Take 1 capsule by mouth daily.    . Cholecalciferol 25 MCG (1000 UT) tablet Take 1,000 Units by mouth 2 (two) times a day.     . ezetimibe (ZETIA) 10 MG tablet Take 10 mg by mouth daily.  (Patient not taking: Reported on 06/19/2020)    . fluticasone (FLONASE) 50 MCG/ACT nasal spray Place 2 sprays into both nostrils daily. 16 g 5  . folic acid (FOLVITE) 1 MG tablet Take 1 mg by mouth at bedtime.     . furosemide (LASIX) 40 MG tablet Take 40 mg by mouth daily.     . isosorbide mononitrate (IMDUR) 15 mg TB24 24 hr tablet Take 15 mg by mouth daily.    . Multiple Vitamin (MULTI-VITAMINS) TABS Take 1 tablet by mouth daily.    . nitrofurantoin, macrocrystal-monohydrate, (MACROBID) 100 MG capsule Take 100 mg by mouth 2 (two) times daily.    . nitroGLYCERIN  (NITROSTAT) 0.4 MG SL tablet Place 1 tablet (0.4 mg total) under the tongue every 5 (five) minutes as needed for chest pain. 25 tablet 3  . pantoprazole (PROTONIX) 40 MG tablet Take 40 mg by mouth daily.     . potassium chloride SA (KLOR-CON) 20 MEQ tablet Take 20 mEq by mouth daily.    . tamsulosin (FLOMAX) 0.4 MG CAPS capsule Take 1 capsule (0.4 mg total) by mouth daily after supper. 30 capsule 3  . triamcinolone cream (KENALOG) 0.1 % Apply 1 application topically 2 (two) times daily as needed. (Patient not taking: Reported on 08/31/2020)    . vitamin B-12 (CYANOCOBALAMIN) 500 MCG tablet Take 500 mcg by mouth 2 (two) times daily.     No current facility-administered medications for this visit.    REVIEW OF SYSTEMS:   10 Point review of Systems was done is negative except as noted above.  PHYSICAL EXAMINATION: ECOG PERFORMANCE STATUS: 1 - Symptomatic but completely ambulatory  Vitals:   08/31/20 1516  BP: (!) 134/94  Pulse: 96  Resp: 19  Temp: 97.7 F (36.5 C)  SpO2: 98%   Filed Weights   08/31/20 1516  Weight: 192 lb 1.6 oz (87.1 kg)   .Body mass index is  26.79 kg/m.   Exam was given in a chair.  GENERAL:alert, in no acute distress and comfortable SKIN: no acute rashes, no significant lesions EYES: conjunctiva are pink and non-injected, sclera anicteric OROPHARYNX: MMM, no exudates, no oropharyngeal erythema or ulceration NECK: supple, no JVD LYMPH:  no palpable lymphadenopathy in the cervical, axillary or inguinal regions LUNGS: clear to auscultation b/l with normal respiratory effort HEART: regular rate & rhythm ABDOMEN:  normoactive bowel sounds , non tender, not distended. Extremity: no pedal edema PSYCH: alert & oriented x 3 with fluent speech NEURO: no focal motor/sensory deficits  LABORATORY DATA:  I have reviewed the data as listed  . CBC Latest Ref Rng & Units 08/24/2020 04/06/2020 12/04/2019  WBC 4.0 - 10.5 K/uL 8.5 6.0 5.5  Hemoglobin 13.0 - 17.0 g/dL 14.5  16.0 14.8  Hematocrit 39.0 - 52.0 % 41.7 47.0 44.6  Platelets 150 - 400 K/uL 168 137(L) 153    CMP Latest Ref Rng & Units 08/24/2020 04/06/2020 12/04/2019  Glucose 70 - 99 mg/dL 98 122(H) 112(H)  BUN 8 - 23 mg/dL '20 17 21  ' Creatinine 0.61 - 1.24 mg/dL 1.14 1.30(H) 1.37(H)  Sodium 135 - 145 mmol/L 141 142 142  Potassium 3.5 - 5.1 mmol/L 3.6 3.5 4.0  Chloride 98 - 111 mmol/L 104 108 108  CO2 22 - 32 mmol/L '27 27 25  ' Calcium 8.9 - 10.3 mg/dL 9.3 9.1 9.5  Total Protein 6.5 - 8.1 g/dL 7.3 7.1 6.9  Total Bilirubin 0.3 - 1.2 mg/dL 1.0 1.2 1.5(H)  Alkaline Phos 38 - 126 U/L 115 88 81  AST 15 - 41 U/L 39 25 21  ALT 0 - 44 U/L 47(H) 24 16        07/09/2019 Prostate Patholgy Report ([id number]):    RADIOGRAPHIC STUDIES: I have personally reviewed the radiological images as listed and agreed with the findings in the report. US AORTA  Result Date: 08/12/2020 CLINICAL DATA:  Abdominal aortic aneurysm without rupture. EXAM: ULTRASOUND OF ABDOMINAL AORTA TECHNIQUE: Ultrasound examination of the abdominal aorta and proximal common iliac arteries was performed to evaluate for aneurysm. Additional color and Doppler images of the distal aorta were obtained to document patency. COMPARISON:  CT dated February 27, 2018 FINDINGS: Abdominal aortic measurements as follows: Proximal:  2.4 x 2.4 cm Mid:  2.4 x 2.5 cm Distal:  3.9 x 3.3 cm Patent: Yes, peak systolic velocity is 53 cm/s Right common iliac artery: 1.5 x 1.2 cm Left common iliac artery: 1.6 x 1.5 cm IMPRESSION: Again noted is an infrarenal abdominal aortic aneurysm measuring up to approximately 3.9 cm. Recommend follow-up ultrasound every 2 years. This recommendation follows ACR consensus guidelines: White Paper of the ACR Incidental Findings Committee II on Vascular Findings. J Am Coll Radiol 2013; 10:789-794. Electronically Signed   By: Constance Holster M.D.   On: 08/12/2020 21:44   Result status: Edited Result - FINAL                               *[facility] [employer].  [place], [place] [id number]                            [phone]  ------------------------------------------------------------------- Transthoracic Echocardiography  (Report amended )  Patient:    Grant, Swager Alex #:       [id number] Study Date: 08/24/2016 Gender:     M Age:        30 Height:     180.3 cm Weight:     90.6 kg BSA:        2.15 m^2 Pt. Status: Room:   ATTENDING    Darlina Guys, MD  Willia Craze, Christopher  REFERRING    [place], [place], Outpatient  SONOGRAPHER  Roseanna Rainbow  cc:  ------------------------------------------------------------------- LV EF: 65% -   70%  ------------------------------------------------------------------- Indications:      Aortic stenosis 424.1.  ------------------------------------------------------------------- History:   PMH:  Amyloidosis.  Coronary artery disease.  Angina pectoris.  Risk factors:  Hypertension. Dyslipidemia.  ------------------------------------------------------------------- Study Conclusions  - Left ventricle: The cavity size was normal. Wall thickness was   increased in a pattern of mild LVH. Systolic function was   vigorous. The estimated ejection fraction was in the range of 65%   to 70%. Wall motion was normal; there were no regional wall   motion abnormalities. Doppler parameters are consistent with   abnormal left ventricular relaxation (grade 1 diastolic   dysfunction). - Aortic valve: Valve mobility was mildly restricted. - Aortic root: The aortic root was mildly dilated. - Mitral valve: Calcified annulus.  Impressions:  - Vigorous LV systolic function; mild diastolic dysfunction; mild   LVH; calcified aortic valve with no significant AS (peak velocity   2 m/s; mean gradient 9 mmHg); mildly dilated aortic root.     ASSESSMENT & PLAN:   80 y.o.  male with above-mentioned multiple medical comorbidities with  #1 Biopsy proven Renal and Systemic AL Amyloidosis Initial BM Bx shows 9% plasma cells and amyloid deposits. Echo shows normal systolic function with only grade 1 diastolic dysfunction. Skeletal survey with no lytic lesions suggestive of multiple myeloma. Serum K/L ratio increased at about 6.22 on diagnosis and has now improved to 1.5 (wnl with normal kappa lambda serum free light chain ratio ) UPEP shows 24h protein improved from 15g to 5g daily 05/29/18 24hour UPEP no M spike   01/12/18 ECHO revealed LV EF of 60-65%   #2  Nephrotic Syndrome - likely related to AL amyloidosis.  Urine Study 07/26/17 showed Protein at 2.232g.  -07/19/17 the pt had a BM Bx exam which revealed Normocellular bone marrow (30-40%) with 2-3% plasma cells and focal amyloid deposition. UPEP 05/29/2018 - showed no M spike and total urinary protein of 138m/24h -- resolution of nephrotic syndrome.  #3 Prostatic Adenocarcinoma - Gleason Score 3+4=7  PLAN: -Discussed pt labwork, 08/24/2020; m-protein still not detected, kidney numbers improved, other chemistries stable, light chains stable, counts normal. -Advised pt that if there is some inflammatory aspect left in lung, the steroids will help with this indirectly. Advised pt that his lung symptoms are most likely scarring due to COVID. -Advised pt that untreated temporal arthritis can be very serious and increase risk of vision loss, stroke, and impact to other arteries. -Recommended pt receive the second COVID booster shot as recently approved. The pt does not wish to get his boosters at this time. -Discussed [place] and pt's eligibility. Will send referral. -Discussed chronic radiation  proctitis. -Advised pt that he continues to be in remission from his AL amyloidosis based on lab and clinical examination. -Recommended pt continue to stay physically active. -Recommended  pt connect with PCP as he may want to continue Prednisone taper longer than one week to treat the Giant Cell Arteritis. -Will not start maintenance treatment at this time. -Continue 50K IU Vitamin D weekly. Goal Vitamin D is 60-90. -Continue Baby ASA. -Will see back in 4 months. Labs 1 week prior.    #4 Patient Active Problem List   Diagnosis Date Noted  . Malignant neoplasm of prostate ([place]) 08/06/2019  . Pneumonia due to COVID-19 virus 10/29/2018  . Acute respiratory failure with hypoxia ([place]) 10/29/2018  . Thrombocytopenia ([place]) 10/29/2018  . Left lower lobe pneumonia 04/22/2018  . Sepsis due to pneumonia ([place]) 04/22/2018  . Engraftment syndrome following stem cell transplantation ([place]) 04/27/2017  . H/O autologous stem cell transplant ([place]) 04/10/2017  . Aortic stenosis 03/08/2017  . Hearing loss 03/08/2017  . OSA (obstructive sleep apnea) 03/08/2017  . Parotid mass 02/23/2017  . Dysfunction of right eustachian tube 02/06/2017  . Presbycusis of both ears 02/06/2017  . Folliculitis 46/21/9471  . AL amyloidosis ([place]) 08/21/2016  . Umbilical hernia 25/27/1292  . DIZZINESS 02/19/2010  . CAD, NATIVE VESSEL 07/21/2009  . CHEST PAIN-UNSPECIFIED 07/21/2009  . HYPERLIPIDEMIA 04/30/2007  . Essential hypertension 04/30/2007  . GASTROESOPHAGEAL REFLUX DISEASE 04/30/2007  . SEIZURE DISORDER 04/30/2007  . COMPRESSION FRACTURE, L1 VERTEBRA 04/30/2007  . DIVERTICULOSIS, COLON, HX OF 04/30/2007  . Other postprocedural status(V45.89) 04/30/2007  -Continue follow-up with primary care physician for management of other medical co-morbidities. -Recommend if pt experiences spike fever to report to hospital with transplant team    FOLLOW UP: RTC w Dr [name] in 4 months. Labs 1 week prior to visit.   The total time spent in the appt was 20 minutes and more than 50% was on counseling and direct patient cares.  All of the patient's questions were answered with apparent satisfaction. The  patient knows to call the clinic with any problems, questions or concerns.   Sullivan Lone MD [place] AAHIVMS [facility] Hematology/Oncology Physician [facility]  (Office):       [phone] (Work cell):  [phone] (Fax):           [phone]  I, Reinaldo Raddle, am acting as scribe for Dr. Sullivan Lone, MD.    .I have reviewed the above documentation for accuracy and completeness, and I agree with the above. Brunetta Genera MD

## 2020-08-31 ENCOUNTER — Other Ambulatory Visit: Payer: Self-pay

## 2020-08-31 ENCOUNTER — Inpatient Hospital Stay (HOSPITAL_BASED_OUTPATIENT_CLINIC_OR_DEPARTMENT_OTHER): Payer: Medicare Other | Admitting: Hematology

## 2020-08-31 VITALS — BP 134/94 | HR 96 | Temp 97.7°F | Resp 19 | Ht 71.0 in | Wt 192.1 lb

## 2020-08-31 DIAGNOSIS — E859 Amyloidosis, unspecified: Secondary | ICD-10-CM | POA: Diagnosis not present

## 2020-08-31 DIAGNOSIS — E8581 Light chain (AL) amyloidosis: Secondary | ICD-10-CM | POA: Diagnosis not present

## 2020-09-02 ENCOUNTER — Telehealth: Payer: Self-pay | Admitting: Hematology

## 2020-09-02 NOTE — Telephone Encounter (Signed)
Scheduled follow-up appointments per 5/9 los. Patient is aware. Mailed calendar.

## 2020-09-03 ENCOUNTER — Other Ambulatory Visit: Payer: Self-pay | Admitting: Internal Medicine

## 2020-09-03 ENCOUNTER — Ambulatory Visit
Admission: RE | Admit: 2020-09-03 | Discharge: 2020-09-03 | Disposition: A | Discharge status: Home / Self Care | Payer: Medicare Other | Source: Ambulatory Visit | Attending: Internal Medicine | Admitting: Internal Medicine

## 2020-09-03 ENCOUNTER — Other Ambulatory Visit: Payer: Self-pay

## 2020-09-03 DIAGNOSIS — R42 Dizziness and giddiness: Secondary | ICD-10-CM

## 2020-09-03 DIAGNOSIS — R519 Headache, unspecified: Secondary | ICD-10-CM

## 2020-09-04 ENCOUNTER — Ambulatory Visit
Admission: RE | Admit: 2020-09-04 | Discharge: 2020-09-04 | Disposition: A | Discharge status: Home / Self Care | Payer: Medicare Other | Source: Ambulatory Visit | Attending: Internal Medicine | Admitting: Internal Medicine

## 2020-09-04 DIAGNOSIS — R519 Headache, unspecified: Secondary | ICD-10-CM

## 2020-09-04 DIAGNOSIS — R42 Dizziness and giddiness: Secondary | ICD-10-CM | POA: Diagnosis present

## 2020-09-04 MED ORDER — GADOBUTROL 1 MMOL/ML IV SOLN
9.0000 mL | Freq: Once | INTRAVENOUS | Status: AC | PRN
  Administered 2020-09-04: 9 mL via INTRAVENOUS

## 2020-09-09 ENCOUNTER — Ambulatory Visit: Payer: Self-pay | Admitting: Surgery

## 2020-09-10 NOTE — Progress Notes (Signed)
Discharge Progress Report  Patient Details  Name: Alex [name]. MRN: [id number] Date of Birth: 10-24-1940 Referring Provider:   [name] [facility] Walk Test from 06/19/2020 in [place]  Referring Provider Alex [name]       Number of Visits: 17  Reason for Discharge:  Patient reached a stable level of exercise. Patient independent in their exercise. Patient has met program and personal goals.  Smoking History:  Social History   Tobacco Use  Smoking Status Former Smoker  . Packs/day: 3.00  . Years: 20.00  . Pack years: 60.00  . Types: Cigarettes  . Quit date: 04/25/1969  . Years since quitting: 51.4  Smokeless Tobacco Former Systems developer  . Types: Chew  . Quit date: 11/07/1983    Diagnosis:  Dyspnea, unspecified type  ADL [facility]:  Pulmonary Assessment Scores    Row Name 06/19/20 1124 08/18/20 1534       ADL [facility]   ADL Phase Entry Exit    SOB Score total 91 77         CAT Score   CAT Score 26 22         mMRC Score   mMRC Score 4 --           Initial Exercise Prescription:  Initial Exercise Prescription - 06/19/20 1100      Date of Initial Exercise RX and Referring Provider   Date 06/19/20    Referring Provider Alex [name]    Expected Discharge Date 08/20/20      NuStep   Level 1    SPM 75    Minutes 15      Track   Minutes 15      Prescription Details   Frequency (times per week) 2    Duration Progress to 30 minutes of continuous aerobic without signs/symptoms of physical distress      Intensity   THRR 40-80% of Max Heartrate 57-114    Ratings of Perceived Exertion 11-13    Perceived Dyspnea 0-4      Progression   Progression Continue to progress workloads to maintain intensity without signs/symptoms of physical distress.      Resistance Training   Training Prescription Yes    Weight Blue bands    Reps 10-15           Discharge Exercise Prescription (Final Exercise Prescription Changes):   Exercise Prescription Changes - 08/13/20 1500      Home Exercise Plan   Plans to continue exercise at Home (comment)   Walking and resistance training with bands   Frequency Add 3 additional days to program exercise sessions.    Initial Home Exercises Provided 08/13/20           Functional Capacity:  6 Minute Walk    Row Name 06/19/20 1108         6 Minute Walk   Phase Initial     Distance 856 feet     Walk Time 6 minutes     # of Rest Breaks 0     MPH 1.62     METS 1.71     RPE 15     Perceived Dyspnea  3     VO2 Peak 6     Symptoms Yes (comment)     Comments Patient complained of chest tightness that is chronic with exertion and shortness of breath. Pt has had heart cath and echo and everything was fine. Per pt, doctors think chest  tightness is from lungs.     Resting HR 71 bpm     Resting BP 132/84     Resting Oxygen Saturation  98 %     Exercise Oxygen Saturation  during 6 min walk 94 %     Max Ex. HR 87 bpm     Max Ex. BP 144/90     2 Minute Post BP 132/90           Interval HR   1 Minute HR 87     2 Minute HR 83     3 Minute HR 83     4 Minute HR 83     5 Minute HR 82     6 Minute HR 84     2 Minute Post HR 75     Interval Heart Rate? Yes           Interval Oxygen   Interval Oxygen? Yes     Baseline Oxygen Saturation % 98 %     1 Minute Oxygen Saturation % 98 %     1 Minute Liters of Oxygen 0 L     2 Minute Oxygen Saturation % 94 %     2 Minute Liters of Oxygen 0 L     3 Minute Oxygen Saturation % 95 %     3 Minute Liters of Oxygen 0 L     4 Minute Oxygen Saturation % 96 %     4 Minute Liters of Oxygen 0 L     5 Minute Oxygen Saturation % 95 %     5 Minute Liters of Oxygen 0 L     6 Minute Oxygen Saturation % 95 %     6 Minute Liters of Oxygen 0 L     2 Minute Post Oxygen Saturation % 99 %     2 Minute Post Liters of Oxygen 0 L            Psychological, QOL, Others - Outcomes: PHQ 2/9: Depression screen PHQ 2/9 06/19/2020  Decreased  Interest 0  Down, Depressed, Hopeless 0  PHQ - 2 Score 0  Altered sleeping 0  Tired, decreased energy 3  Change in appetite 0  Feeling bad or failure about yourself  0  Trouble concentrating 1  Moving slowly or fidgety/restless 0  Suicidal thoughts 0  Difficult doing work/chores Somewhat difficult  Some recent data might be hidden    Quality of Life:   Personal Goals: Goals established at orientation with interventions provided to work toward goal.  Personal Goals and Risk Factors at Admission - 06/19/20 1045      Core Components/Risk Factors/Patient Goals on Admission   Improve shortness of breath with ADL's Yes    Intervention Provide education, individualized exercise plan and daily activity instruction to help decrease symptoms of SOB with activities of daily living.    Expected Outcomes Short Term: Improve cardiorespiratory fitness to achieve a reduction of symptoms when performing ADLs;Long Term: Be able to perform more ADLs without symptoms or delay the onset of symptoms    Hypertension Yes    Intervention Provide education on lifestyle modifcations including regular physical activity/exercise, weight management, moderate sodium restriction and increased consumption of fresh fruit, vegetables, and low fat dairy, alcohol moderation, and smoking cessation.    Expected Outcomes Long Term: Maintenance of blood pressure at goal levels.            Personal Goals Discharge:  Goals and Risk Factor Review  [place] Name 06/22/20 1407 07/20/20 1356 08/13/20 0847         Core Components/Risk Factors/Patient Goals Review   Personal Goals Review Develop more efficient breathing techniques such as purse lipped breathing and diaphragmatic breathing and practicing self-pacing with activity.;Increase knowledge of respiratory medications and ability to use respiratory devices properly.;Improve shortness of breath with ADL's Develop more efficient breathing techniques such as purse lipped  breathing and diaphragmatic breathing and practicing self-pacing with activity.;Increase knowledge of respiratory medications and ability to use respiratory devices properly.;Improve shortness of breath with ADL's Develop more efficient breathing techniques such as purse lipped breathing and diaphragmatic breathing and practicing self-pacing with activity.;Increase knowledge of respiratory medications and ability to use respiratory devices properly.;Improve shortness of breath with ADL's     Review Alex [name] will begin exercising in pulmonary rehab 06/23/2020. Alex [name] is progressing well despite his shortness of breath.  Since he had covid-19 1 year ago he struggles with shortness of breath with any activity. He is exercising @ 2.5 mets on the nustep and walking 16 laps on the track in 15 minutes. "Alex [name]" is limited by his shortness of breath which developed after he had covid 1 1/2 years ago.  Some days are better than others for him, he has been bothered by seasonal allergies and has made it difficult for him to breathe recently.He has slowly.     Expected Outcomes See admission goals. -- See admission goals.            Exercise Goals and Review:  Exercise Goals    Row Name 06/19/20 1043             Exercise Goals   Increase Physical Activity Yes       Intervention Provide advice, education, support and counseling about physical activity/exercise needs.;Develop an individualized exercise prescription for aerobic and resistive training based on initial evaluation findings, risk stratification, comorbidities and participant's personal goals.       Expected Outcomes Short Term: Attend rehab on a regular basis to increase amount of physical activity.;Long Term: Add in home exercise to make exercise part of routine and to increase amount of physical activity.;Long Term: Exercising regularly at least 3-5 days a week.       Increase Strength and Stamina Yes       Intervention Provide advice, education,  support and counseling about physical activity/exercise needs.;Develop an individualized exercise prescription for aerobic and resistive training based on initial evaluation findings, risk stratification, comorbidities and participant's personal goals.       Expected Outcomes Short Term: Increase workloads from initial exercise prescription for resistance, speed, and METs.;Short Term: Perform resistance training exercises routinely during rehab and add in resistance training at home;Long Term: Improve cardiorespiratory fitness, muscular endurance and strength as measured by increased METs and functional capacity (6MWT)       Able to understand and use rate of perceived exertion (RPE) scale Yes       Intervention Provide education and explanation on how to use RPE scale       Expected Outcomes Short Term: Able to use RPE daily in rehab to express subjective intensity level;Long Term:  Able to use RPE to guide intensity level when exercising independently       Able to understand and use Dyspnea scale Yes       Intervention Provide education and explanation on how to use Dyspnea scale       Expected Outcomes Short Term: Able to use Dyspnea scale daily in rehab  to express subjective sense of shortness of breath during exertion;Long Term: Able to use Dyspnea scale to guide intensity level when exercising independently       Knowledge and understanding of Target Heart Rate Range (THRR) Yes       Intervention Provide education and explanation of THRR including how the numbers were predicted and where they are located for reference       Expected Outcomes Short Term: Able to state/look up THRR;Long Term: Able to use THRR to govern intensity when exercising independently;Short Term: Able to use daily as guideline for intensity in rehab       Understanding of Exercise Prescription Yes       Intervention Provide education, explanation, and written materials on patient's individual exercise prescription        Expected Outcomes Short Term: Able to explain program exercise prescription;Long Term: Able to explain home exercise prescription to exercise independently              Exercise Goals Re-Evaluation:  Exercise Goals Re-Evaluation    [place] Name 07/21/20 1538 08/17/20 0930           Exercise Goal Re-Evaluation   Exercise Goals Review Increase Physical Activity;Increase Strength and Stamina;Able to understand and use rate of perceived exertion (RPE) scale;Able to understand and use Dyspnea scale;Knowledge and understanding of Target Heart Rate Range (THRR);Understanding of Exercise Prescription Increase Physical Activity;Increase Strength and Stamina;Able to understand and use rate of perceived exertion (RPE) scale;Able to understand and use Dyspnea scale;Knowledge and understanding of Target Heart Rate Range (THRR);Understanding of Exercise Prescription      Comments Patient has completed 9 exercise sessions and has been steady with making progressions with workload increases and MET increases. He is independent with resistance training and stretches. He is exercising at 2.8 METS on level 4 on the Nustep and 3.32 METS walking the track. Will continue to monitor and progress as he is able. Will also be discussing home exercise with patient in upcoming weeks. Alex [name] has completed 16 exercise sessions and has been with workload and MET increases. He has made great improvements in the program and he states he can tell that exercise has made a difference. He is scheduled to graduate 08/20/20 and will discuss exercise prescription for after completion of program. He is exercising at 3.7 METS on the Nustep and 3.09 METS walking the track. Will continue to monitor.      Expected Outcomes Through exercise at rehab and home, the patient will decrease shortness of breath with daily activities and feel confident in carrying out an exercise regimn at home. For pt to continue exercise at home or local fitness center  after completion of pulmonary rehab and for him to continue to decrease shortness of breath with daily activities.             Nutrition & Weight - Outcomes:  Pre Biometrics - 06/19/20 1044      Pre Biometrics   Grip Strength 36 kg            Nutrition:  Nutrition Therapy & Goals - 07/09/20 1443      Nutrition Therapy   Diet TLC    Drug/Food Interactions Statins/Certain Fruits      Personal Nutrition Goals   Nutrition Goal Pt to build a healthy plate including vegetables, fruits, whole grains, and low-fat dairy products in a heart healthy meal plan.      Intervention Plan   Intervention Prescribe, educate and counsel regarding individualized specific  dietary modifications aiming towards targeted core components such as weight, hypertension, lipid management, diabetes, heart failure and other comorbidities.    Expected Outcomes Short Term Goal: Understand basic principles of dietary content, such as calories, fat, sodium, cholesterol and nutrients.           Nutrition Discharge:   Education Questionnaire Score:  Knowledge Questionnaire Score - 08/18/20 1531      Knowledge Questionnaire Score   Post Score 14/18           Goals reviewed with patient; copy given to patient.

## 2020-09-10 NOTE — Progress Notes (Signed)
Discharge Progress Report  Patient Details  Name: Alex [name]. MRN: [id number] Date of Birth: 01/05/1941 Referring Provider:   Flowsheet Row Pulmonary Rehab Walk Test from 06/19/2020 in [facility]  Referring Provider Dr. Vaughan Browner       Number of Visits: 0  Reason for Discharge:  Early Exit:  Patient never exercised in pulmonary rehab due to a severely sprained ankle.  Smoking History:  Social History   Tobacco Use  Smoking Status Former Smoker  . Packs/day: 3.00  . Years: 20.00  . Pack years: 60.00  . Types: Cigarettes  . Quit date: 04/25/1969  . Years since quitting: 51.4  Smokeless Tobacco Former Systems developer  . Types: Chew  . Quit date: 11/07/1983    Diagnosis:  Dyspnea, unspecified type  ADL [facility]:  Pulmonary Assessment Scores    Row Name 06/19/20 1124 08/18/20 1534       ADL [facility]   ADL Phase Entry Exit    SOB Score total 91 77         CAT Score   CAT Score 26 22         mMRC Score   mMRC Score 4 --           Initial Exercise Prescription:  Initial Exercise Prescription - 06/19/20 1100      Date of Initial Exercise RX and Referring Provider   Date 06/19/20    Referring Provider Dr. Vaughan Browner    Expected Discharge Date 08/20/20      NuStep   Level 1    SPM 75    Minutes 15      Track   Minutes 15      Prescription Details   Frequency (times per week) 2    Duration Progress to 30 minutes of continuous aerobic without signs/symptoms of physical distress      Intensity   THRR 40-80% of Max Heartrate 57-114    Ratings of Perceived Exertion 11-13    Perceived Dyspnea 0-4      Progression   Progression Continue to progress workloads to maintain intensity without signs/symptoms of physical distress.      Resistance Training   Training Prescription Yes    Weight Blue bands    Reps 10-15           Discharge Exercise Prescription (Final Exercise Prescription Changes):  Exercise Prescription Changes -  08/13/20 1500      Home Exercise Plan   Plans to continue exercise at Home (comment)   Walking and resistance training with bands   Frequency Add 3 additional days to program exercise sessions.    Initial Home Exercises Provided 08/13/20           Functional Capacity:  6 Minute Walk    Row Name 06/19/20 1108         6 Minute Walk   Phase Initial     Distance 856 feet     Walk Time 6 minutes     # of Rest Breaks 0     MPH 1.62     METS 1.71     RPE 15     Perceived Dyspnea  3     VO2 Peak 6     Symptoms Yes (comment)     Comments Patient complained of chest tightness that is chronic with exertion and shortness of breath. Pt has had heart cath and echo and everything was fine. Per pt, doctors think chest tightness is from lungs.  Resting HR 71 bpm     Resting BP 132/84     Resting Oxygen Saturation  98 %     Exercise Oxygen Saturation  during 6 min walk 94 %     Max Ex. HR 87 bpm     Max Ex. BP 144/90     2 Minute Post BP 132/90           Interval HR   1 Minute HR 87     2 Minute HR 83     3 Minute HR 83     4 Minute HR 83     5 Minute HR 82     6 Minute HR 84     2 Minute Post HR 75     Interval Heart Rate? Yes           Interval Oxygen   Interval Oxygen? Yes     Baseline Oxygen Saturation % 98 %     1 Minute Oxygen Saturation % 98 %     1 Minute Liters of Oxygen 0 L     2 Minute Oxygen Saturation % 94 %     2 Minute Liters of Oxygen 0 L     3 Minute Oxygen Saturation % 95 %     3 Minute Liters of Oxygen 0 L     4 Minute Oxygen Saturation % 96 %     4 Minute Liters of Oxygen 0 L     5 Minute Oxygen Saturation % 95 %     5 Minute Liters of Oxygen 0 L     6 Minute Oxygen Saturation % 95 %     6 Minute Liters of Oxygen 0 L     2 Minute Post Oxygen Saturation % 99 %     2 Minute Post Liters of Oxygen 0 L            Psychological, QOL, Others - Outcomes: PHQ 2/9: Depression screen PHQ 2/9 06/19/2020  Decreased Interest 0  Down, Depressed,  Hopeless 0  PHQ - 2 Score 0  Altered sleeping 0  Tired, decreased energy 3  Change in appetite 0  Feeling bad or failure about yourself  0  Trouble concentrating 1  Moving slowly or fidgety/restless 0  Suicidal thoughts 0  Difficult doing work/chores Somewhat difficult  Some recent data might be hidden    Quality of Life:   Personal Goals: Goals established at orientation with interventions provided to work toward goal.  Personal Goals and Risk Factors at Admission - 06/19/20 1045      Core Components/Risk Factors/Patient Goals on Admission   Improve shortness of breath with ADL's Yes    Intervention Provide education, individualized exercise plan and daily activity instruction to help decrease symptoms of SOB with activities of daily living.    Expected Outcomes Short Term: Improve cardiorespiratory fitness to achieve a reduction of symptoms when performing ADLs;Long Term: Be able to perform more ADLs without symptoms or delay the onset of symptoms    Hypertension Yes    Intervention Provide education on lifestyle modifcations including regular physical activity/exercise, weight management, moderate sodium restriction and increased consumption of fresh fruit, vegetables, and low fat dairy, alcohol moderation, and smoking cessation.    Expected Outcomes Long Term: Maintenance of blood pressure at goal levels.            Personal Goals Discharge:  Goals and Risk Factor Review    Row Name 06/22/20 1407 07/20/20 1356  08/13/20 0847         Core Components/Risk Factors/Patient Goals Review   Personal Goals Review Develop more efficient breathing techniques such as purse lipped breathing and diaphragmatic breathing and practicing self-pacing with activity.;Increase knowledge of respiratory medications and ability to use respiratory devices properly.;Improve shortness of breath with ADL's Develop more efficient breathing techniques such as purse lipped breathing and diaphragmatic  breathing and practicing self-pacing with activity.;Increase knowledge of respiratory medications and ability to use respiratory devices properly.;Improve shortness of breath with ADL's Develop more efficient breathing techniques such as purse lipped breathing and diaphragmatic breathing and practicing self-pacing with activity.;Increase knowledge of respiratory medications and ability to use respiratory devices properly.;Improve shortness of breath with ADL's     Review Alex [name] will begin exercising in pulmonary rehab 06/23/2020. Alex [name] is progressing well despite his shortness of breath.  Since he had covid-19 1 year ago he struggles with shortness of breath with any activity. He is exercising @ 2.5 mets on the nustep and walking 16 laps on the track in 15 minutes. "Alex [name]" is limited by his shortness of breath which developed after he had covid 1 1/2 years ago.  Some days are better than others for him, he has been bothered by seasonal allergies and has made it difficult for him to breathe recently.He has slowly.     Expected Outcomes See admission goals. -- See admission goals.            Exercise Goals and Review:  Exercise Goals    Row Name 06/19/20 1043             Exercise Goals   Increase Physical Activity Yes       Intervention Provide advice, education, support and counseling about physical activity/exercise needs.;Develop an individualized exercise prescription for aerobic and resistive training based on initial evaluation findings, risk stratification, comorbidities and participant's personal goals.       Expected Outcomes Short Term: Attend rehab on a regular basis to increase amount of physical activity.;Long Term: Add in home exercise to make exercise part of routine and to increase amount of physical activity.;Long Term: Exercising regularly at least 3-5 days a week.       Increase Strength and Stamina Yes       Intervention Provide advice, education, support and counseling about  physical activity/exercise needs.;Develop an individualized exercise prescription for aerobic and resistive training based on initial evaluation findings, risk stratification, comorbidities and participant's personal goals.       Expected Outcomes Short Term: Increase workloads from initial exercise prescription for resistance, speed, and METs.;Short Term: Perform resistance training exercises routinely during rehab and add in resistance training at home;Long Term: Improve cardiorespiratory fitness, muscular endurance and strength as measured by increased METs and functional capacity (6MWT)       Able to understand and use rate of perceived exertion (RPE) scale Yes       Intervention Provide education and explanation on how to use RPE scale       Expected Outcomes Short Term: Able to use RPE daily in rehab to express subjective intensity level;Long Term:  Able to use RPE to guide intensity level when exercising independently       Able to understand and use Dyspnea scale Yes       Intervention Provide education and explanation on how to use Dyspnea scale       Expected Outcomes Short Term: Able to use Dyspnea scale daily in rehab to express subjective sense of shortness  of breath during exertion;Long Term: Able to use Dyspnea scale to guide intensity level when exercising independently       Knowledge and understanding of Target Heart Rate Range (THRR) Yes       Intervention Provide education and explanation of THRR including how the numbers were predicted and where they are located for reference       Expected Outcomes Short Term: Able to state/look up THRR;Long Term: Able to use THRR to govern intensity when exercising independently;Short Term: Able to use daily as guideline for intensity in rehab       Understanding of Exercise Prescription Yes       Intervention Provide education, explanation, and written materials on patient's individual exercise prescription       Expected Outcomes Short Term:  Able to explain program exercise prescription;Long Term: Able to explain home exercise prescription to exercise independently              Exercise Goals Re-Evaluation:  Exercise Goals Re-Evaluation    Alex [name] Name 07/21/20 1538 08/17/20 0930           Exercise Goal Re-Evaluation   Exercise Goals Review Increase Physical Activity;Increase Strength and Stamina;Able to understand and use rate of perceived exertion (RPE) scale;Able to understand and use Dyspnea scale;Knowledge and understanding of Target Heart Rate Range (THRR);Understanding of Exercise Prescription Increase Physical Activity;Increase Strength and Stamina;Able to understand and use rate of perceived exertion (RPE) scale;Able to understand and use Dyspnea scale;Knowledge and understanding of Target Heart Rate Range (THRR);Understanding of Exercise Prescription      Comments Patient has completed 9 exercise sessions and has been steady with making progressions with workload increases and MET increases. He is independent with resistance training and stretches. He is exercising at 2.8 METS on level 4 on the Nustep and 3.32 METS walking the track. Will continue to monitor and progress as he is able. Will also be discussing home exercise with patient in upcoming weeks. Alex [name] has completed 16 exercise sessions and has been with workload and MET increases. He has made great improvements in the program and he states he can tell that exercise has made a difference. He is scheduled to graduate 08/20/20 and will discuss exercise prescription for after completion of program. He is exercising at 3.7 METS on the Nustep and 3.09 METS walking the track. Will continue to monitor.      Expected Outcomes Through exercise at rehab and home, the patient will decrease shortness of breath with daily activities and feel confident in carrying out an exercise regimn at home. For pt to continue exercise at home or local fitness center after completion of pulmonary  rehab and for him to continue to decrease shortness of breath with daily activities.             Nutrition & Weight - Outcomes:  Pre Biometrics - 06/19/20 1044      Pre Biometrics   Grip Strength 36 kg            Nutrition:  Nutrition Therapy & Goals - 07/09/20 1443      Nutrition Therapy   Diet TLC    Drug/Food Interactions Statins/Certain Fruits      Personal Nutrition Goals   Nutrition Goal Pt to build a healthy plate including vegetables, fruits, whole grains, and low-fat dairy products in a heart healthy meal plan.      Intervention Plan   Intervention Prescribe, educate and counsel regarding individualized specific dietary modifications aiming towards targeted core  components such as weight, hypertension, lipid management, diabetes, heart failure and other comorbidities.    Expected Outcomes Short Term Goal: Understand basic principles of dietary content, such as calories, fat, sodium, cholesterol and nutrients.           Nutrition Discharge:   Education Questionnaire Score:  Knowledge Questionnaire Score - 08/18/20 1531      Knowledge Questionnaire Score   Post Score 14/18           Goals reviewed with patient; copy given to patient.

## 2020-09-10 NOTE — Addendum Note (Signed)
Encounter addended by: Lance Morin, RN on: 09/10/2020 3:20 PM  Actions taken: Clinical Note Signed

## 2020-09-10 NOTE — Addendum Note (Signed)
Encounter addended by: George Ina, RD on: 09/10/2020 12:06 PM  Actions taken: Flowsheet data copied forward, Flowsheet accepted

## 2020-09-14 ENCOUNTER — Encounter: Payer: Self-pay | Admitting: Cardiovascular Disease

## 2020-09-14 ENCOUNTER — Other Ambulatory Visit: Payer: Self-pay

## 2020-09-14 ENCOUNTER — Ambulatory Visit: Payer: Medicare Other | Admitting: Cardiovascular Disease

## 2020-09-14 VITALS — BP 90/60 | HR 89 | Ht 71.0 in | Wt 187.8 lb

## 2020-09-14 DIAGNOSIS — I350 Nonrheumatic aortic (valve) stenosis: Secondary | ICD-10-CM | POA: Diagnosis not present

## 2020-09-14 DIAGNOSIS — I25118 Atherosclerotic heart disease of native coronary artery with other forms of angina pectoris: Secondary | ICD-10-CM

## 2020-09-14 DIAGNOSIS — E7800 Pure hypercholesterolemia, unspecified: Secondary | ICD-10-CM

## 2020-09-14 DIAGNOSIS — I10 Essential (primary) hypertension: Secondary | ICD-10-CM

## 2020-09-14 NOTE — Progress Notes (Signed)
Chief Complaint  Patient presents with  . Follow-up    CAD   History of Present Illness: 80 yo male with history of amyloidosis with nephrotic syndrome, HTN, moderate non-obstructive CAD by cath 2011, aortic stenosis, GERD, hyperlipidemia and carotid artery disease who is here today for follow up. Carotid artery dopplers 04/05/12 with mild bilateral disease. Stress myoview July 2016 with no ischemia. Echo May 2018 with July 2016 with normal LV function, mild aortic stenosis. He was seen in our office March 2017 by Truitt Merle, NP and at that time noted occasional chest pain. Imdur was added. His chest pain improved. He was diagnosed with amyloidosis in April 2018 treated with chemotherapy for 8 months, then stem cell transplant December 2018 followed by chemotherapy.He is on maintenance chemotherapy. Echo September 2019 with LVEF=60-65%. Mild to moderate AS with mean gradient 19 mmHg, peak gradient 35 mmHg. He was admitted to [facility] July 6301 with complications from Covid 19. He developed respiratory failure and was treated with Remdesivir, steroids, Actemra and plasma. He was diagnosed with a pneumomediastinum. He was found to have a pulmonary embolism and was placed on Eliquis. He is now off of Eliquis. Limited echo July 2020 with normal LV systolic function. No doppler assessment of the aortic valve. Echo February 2022 with LVEF=60-65%, mild LVH. Moderately severe aortic stenosis with mean gradient 17 mmHg, peak gradient 32 mmHg, AVA 1.19 cm2, dimensionless index 0.24.    He is here today for follow up. The patient denies any chest pain, dyspnea, palpitations, lower extremity edema, orthopnea, PND, dizziness, near syncope or syncope. He does not feel well. He has had a headache for 4 weeks. Temporal artery biopsy later this week. Stress test at the [facility] last week. He has not heard back from the stress test yet.   Primary Care Physician: Ginger Organ., MD  Past Medical History:   Diagnosis Date  . AL amyloidosis [facility]) oncologist-- dr [name]   Renal and Systemic , dx 04/ 2018;  renal bx 08-05-2016 and bone marrow bx 08-29-2016;  8 month chemotherapy then stell cell transplant 12/ 2018 then chemo again;  since then been on oral chemo daily (11-07-2019 per pt has been on hold until after prostate seed implants)  . Arthritis   . CAD (coronary artery disease) cardiologist-- dr [name];  also followed by cardiologist w/ [place] in [place], dr [name]   moderate nonobstrucive disease by cath 2011; Lexiscan Myoview 6/14--Normal study, no scar or ischemia, EF 62%;  11-14-2014 nuclear study low risk w/ normal perfusion, nuclear ef 67%  . CKD (chronic kidney disease), stage III ([place])   . Diverticulosis of colon   . GERD (gastroesophageal reflux disease)   . H/O stem cell transplant ([place]) 03/2017  . Hiatal hernia   . History of 2019 novel coronavirus disease (COVID-19) 09/2018   [facility], hospital admission 10-28-2019 respiratory failure/ pneumometrostinom/ PE ;  was not intubated , treatment with remdesivir/ steroids/ actemra/ plasma  . History of basal cell carcinoma (BCC)   . History of colon polyps   . History of MRSA infection    culture positive  . History of pulmonary embolus (PE) 10/2018   with covid 19, completed treatment with eliquis  . History of seizures    11-07-2019  per pt last one 1970s, unknown cause  . History of vertebral compression fracture 1988   lumbar  . Hyperlipidemia   . Hypertension   . Neuropathy due  to chemotherapeutic drug ([place])    fingers tips and feet  . Nocturia   . On supplemental oxygen therapy    post covid 19;   11-07-2019 pt uses O2 every night 2L via [place],  and uses supplements during the day when walking around neighborhood and goes out to appointments/ store that he has to walk a distance  . OSA (obstructive sleep apnea)    study in epic 03-18-2005 mild to moderate   (11-07-2019  per pt used cpap up until  when he lost alot of weight, stoped approx. 2012  . Prostate cancer [facility]) urologist--- dr pace/  oncology-- dr Tammi Klippel   dx 03/ 2021  . Pulmonary fibrosis ([place]) 09/2018   pulmonologist-- dr Vaughan Browner;   post covid 19  . SOB (shortness of breath) on exertion    11-07-2019  per pt walks around neighborhood for 30 minutes twice daily, he does have to stop frequently d/t sob and since weather has been hot he uses his supplemental oxygen;  stated he has a pulse ox. at home , when he is sitting down O2 sat 96 -97% on RA when up walking around the house O2 sat 93-94% on RA;  pt stated he carry's his O2 w/ him when he goes to doctor appts/store d/t distance  . Wears glasses   . Wears hearing aid in both ears     Past Surgical History:  Procedure Laterality Date  . CARDIAC CATHETERIZATION  05-21-1999  and 12-29-2000  @[facility]   dr Maryjo [place] nuclear study;  moderate nonobstructive disease involving LAD and LCx;  second done due to in Reversal research trial  . CARDIAC CATHETERIZATION  07/07/2009  dr [name]   nonobstructive disease w/ normal LVF  . CARPAL TUNNEL RELEASE Left 1994  . CERVICAL FUSION  1988   C2 -- 4  . CYSTOSCOPY N/A 11/13/2019   Procedure: CYSTOSCOPY FLEXIBLE;  Surgeon: [name], MD;  Location: [facility] [place];  Service: Urology;  Laterality: N/A;  no seeds detected per Dr. Claudia Desanctis  . FOOT SURGERY Bilateral x3 last one 1980s   bunionectomy great toe--- x2 right and x1 left  . KNEE ARTHROSCOPY Left 1996  . NASAL SEPTUM SURGERY  x3 last one 08-09-2010 @ [place]  . PAROTIDECTOMY Right 03/09/2017  . RADIOACTIVE SEED IMPLANT N/A 11/13/2019   Procedure: RADIOACTIVE SEED IMPLANT/BRACHYTHERAPY IMPLANT;  Surgeon: [name], MD;  Location: [facility];  Service: Urology;  Laterality: N/A;  ONLY NEEDS 90 MIN FOR ALL PROCEDURES  . ROTATOR CUFF REPAIR Bilateral x1 rigth (unsure date);  x2 left last one 09-07-2009 @[facility] ;  right  . SPACE OAR INSTILLATION N/A  11/13/2019   Procedure: SPACE OAR INSTILLATION;  Surgeon: [name], MD;  Location: [facility];  Service: Urology;  Laterality: N/A;  . TRIGGER FINGER RELEASE Bilateral x2  last one 2001 approx.   x1 finger left hand;  x2 fingers right hand  . UMBILICAL HERNIA REPAIR  01/19/2011  . VASECTOMY  approx. 1968 (with anesthesia)    Current Outpatient Medications  Medication Sig Dispense Refill  . acyclovir (ZOVIRAX) 200 MG capsule Take 200 mg by mouth 2 (two) times daily.    . Ascorbic Acid (VITAMIN C) 1000 MG tablet Take 1,000 mg by mouth 2 (two) times daily.    [name] Kitchen aspirin EC 81 MG tablet Take 81 mg by mouth daily.    [name] Kitchen atorvastatin (LIPITOR) 40 MG tablet Take 40 mg by mouth at bedtime.     [name] Kitchen  b complex vitamins capsule Take 1 capsule by mouth daily.    . carvedilol (COREG) 6.25 MG tablet Take 6.25 mg by mouth every morning.    . Cholecalciferol 25 MCG (1000 UT) tablet Take 1,000 Units by mouth 2 (two) times a day.     . fluticasone (FLONASE) 50 MCG/ACT nasal spray Place 2 sprays into both nostrils daily. 16 g 5  . folic acid (FOLVITE) 1 MG tablet Take 1 mg by mouth at bedtime.     . furosemide (LASIX) 40 MG tablet Take 40 mg by mouth daily.     . Ipratropium-Albuterol (COMBIVENT RESPIMAT) 20-100 MCG/ACT AERS respimat Inhale 1 puff into the lungs every 6 (six) hours as needed for wheezing or shortness of breath.    . isosorbide mononitrate (IMDUR) 15 mg TB24 24 hr tablet Take 15 mg by mouth daily.    . Multiple Vitamin (MULTI-VITAMINS) TABS Take 1 tablet by mouth at bedtime.    . nitroGLYCERIN (NITROSTAT) 0.4 MG SL tablet Place 1 tablet (0.4 mg total) under the tongue every 5 (five) minutes as needed for chest pain. 25 tablet 3  . pantoprazole (PROTONIX) 40 MG tablet Take 40 mg by mouth daily.     . potassium chloride SA (KLOR-CON) 20 MEQ tablet Take 20 mEq by mouth daily.    . tamsulosin (FLOMAX) 0.4 MG CAPS capsule Take 0.4 mg by mouth 2 (two) times daily.    . vitamin  B-12 (CYANOCOBALAMIN) 500 MCG tablet Take 500 mcg by mouth daily.     No current facility-administered medications for this visit.    Allergies  Allergen Reactions  . Ramipril Cough    Other reaction(s): Cough (ALLERGY/intolerance)    Social History   Socioeconomic History  . Marital status: Married    Spouse name: Not on file  . Number of children: 2  . Years of education: 77  . Highest education level: Not on file  Occupational History  . Occupation: Retired  Tobacco Use  . Smoking status: Former Smoker    Packs/day: 3.00    Years: 20.00    Pack years: 60.00    Types: Cigarettes    Quit date: 04/25/1969    Years since quitting: 51.4  . Smokeless tobacco: Former Systems developer    Types: [place] date: 11/07/1983  Vaping Use  . Vaping Use: Never used  Substance and Sexual Activity  . Alcohol use: Yes    Comment: occasional  . Drug use: Never  . Sexual activity: Not Currently    Comment: SHIM 1. Reports erectile dysfunction.;    Other Topics Concern  . Not on file  Social History Narrative   ** Merged History Encounter **   Lives at home w/ his wife   Right-handed   Caffeine: occasional Pepsi       Social Determinants of Health   Financial Resource Strain: Not on file  Food Insecurity: Not on file  Transportation Needs: Not on file  Physical Activity: Not on file  Stress: Not on file  Social Connections: Not on file  Intimate Partner Violence: Not on file    Family History  Problem Relation Age of Onset  . Diabetes Father   . Heart disease Father   . Heart attack Father 49  . Hypertension Father   . Hearing loss Mother   . Eczema Sister   . Cancer Sister        1 sister had cancer but patient unsure of type  .  Hyperlipidemia Other   . Diabetes Other   . Hypertension Other   . Seizures Other   . Thyroid disease Other   . Colon cancer Neg Hx   . Stroke Neg Hx   . Breast cancer Neg Hx   . Pancreatic cancer Neg Hx   . Prostate cancer Neg Hx      Review of Systems:  As stated in the HPI and otherwise negative.   BP 90/60   Pulse 89   Ht 5\' 11"  (1.803 m)   Wt 187 lb 12.8 oz (85.2 kg)   SpO2 97%   BMI 26.19 kg/m   Physical Examination:  General: Well developed, well nourished, NAD  HEENT: OP clear, mucus membranes moist  SKIN: warm, dry. No rashes. Neuro: No focal deficits  Musculoskeletal: Muscle strength 5/5 all ext  Psychiatric: Mood and affect normal  Neck: No JVD, no carotid bruits, no thyromegaly, no lymphadenopathy.  Lungs:Clear bilaterally, no wheezes, rhonci, crackles Cardiovascular: Regular rate and rhythm. Systolic murmur.  Abdomen:Soft. Bowel sounds present. Non-tender.  Extremities: No lower extremity edema. Pulses are 2 + in the bilateral DP/PT.  Echo February 2022:  1. Left ventricular ejection fraction, by estimation, is 60 to 65%. The  left ventricle has normal function. The left ventricle has no regional  wall motion abnormalities. There is mild concentric left ventricular  hypertrophy. Left ventricular diastolic  parameters are consistent with Grade I diastolic dysfunction (impaired  relaxation).  2. Right ventricular systolic function is normal. The right ventricular  size is normal.  3. The mitral valve is normal in structure. No evidence of mitral valve  regurgitation. No evidence of mitral stenosis.  4. The aortic valve is calcified. There is mild calcification of the  aortic valve. There is mild thickening of the aortic valve. Aortic valve  regurgitation is trivial. Moderate aortic valve stenosis. Aortic valve  area, by VTI measures 1.19 cm. Aortic  valve mean gradient measures 17.0 mmHg. Aortic valve Vmax measures 2.83  m/s.  5. Aortic dilatation noted. There is mild dilatation of the aortic root,  measuring 38 mm. There is mild dilatation of the ascending aorta,  measuring 37 mm.  6. The inferior vena cava is normal in size with greater than 50%  respiratory variability,  suggesting right atrial pressure of 3 mmHg.  7. Compared to prior echo AS appears to have progressed. The AVA (VTI)  has decreased from 1.5 to 1.19cm2, P/M transaortic gradients have  increased fro 20/11 to 32/18mmHg, DI has decreased from 0.35 to 0.24. The  stroke volume index is low at 32. Although  the mean AVG is more consistent with mild AS, the dimensionless index is  low and in setting of low SV this may represent low flow low gradient  aortic stenosis that is at least moderatein severity.   FINDINGS  Left Ventricle: Left ventricular ejection fraction, by estimation, is 60  to 65%. The left ventricle has normal function. The left ventricle has no  regional wall motion abnormalities. Definity contrast agent was given IV  to delineate the left ventricular  endocardial borders. The left ventricular internal cavity size was normal  in size. There is mild concentric left ventricular hypertrophy. Left  ventricular diastolic parameters are consistent with Grade I diastolic  dysfunction (impaired relaxation).  Normal left ventricular filling pressure.   Right Ventricle: The right ventricular size is normal. No increase in  right ventricular wall thickness. Right ventricular systolic function is  normal.   Left Atrium: Left  atrial size was normal in size.   Right Atrium: Right atrial size was normal in size.   Pericardium: There is no evidence of pericardial effusion.   Mitral Valve: The mitral valve is normal in structure. Mild mitral annular  calcification. No evidence of mitral valve regurgitation. No evidence of  mitral valve stenosis.   Tricuspid Valve: The tricuspid valve is normal in structure. Tricuspid  valve regurgitation is trivial. No evidence of tricuspid stenosis.   Aortic Valve: The aortic valve is calcified. There is mild calcification  of the aortic valve. There is mild thickening of the aortic valve. Aortic  valve regurgitation is trivial. Moderate aortic  stenosis is present.  Aortic valve mean gradient measures  17.0 mmHg. Aortic valve peak gradient measures 32.0 mmHg. Aortic valve  area, by VTI measures 1.19 cm.   Pulmonic Valve: The pulmonic valve was normal in structure. Pulmonic valve  regurgitation is trivial. No evidence of pulmonic stenosis.   Aorta: Aortic dilatation noted. There is mild dilatation of the aortic  root, measuring 38 mm. There is mild dilatation of the ascending aorta,  measuring 37 mm.   Venous: The inferior vena cava is normal in size with greater than 50%  respiratory variability, suggesting right atrial pressure of 3 mmHg.   IAS/Shunts: No atrial level shunt detected by color flow Doppler.     LEFT VENTRICLE  PLAX 2D  LVIDd:     3.50 cm Diastology  LVIDs:     2.30 cm LV e' medial:  5.11 cm/s  LV PW:     1.70 cm LV E/e' medial: 8.5  LV IVS:    1.30 cm LV e' lateral:  5.55 cm/s  LVOT diam:   2.50 cm LV E/e' lateral: 7.8  LV SV:     67  LV SV Index:  32  LVOT Area:   4.91 cm     RIGHT VENTRICLE  RV Basal diam: 3.70 cm  RV Mid diam:  3.40 cm  RV S prime:   7.83 cm/s  TAPSE (M-mode): 1.9 cm   LEFT ATRIUM       Index  LA diam:    2.90 cm 1.37 cm/m  LA Vol (A2C):  40.8 ml 19.33 ml/m  LA Vol (A4C):  43.6 ml 20.65 ml/m  LA Biplane Vol: 43.7 ml 20.70 ml/m  AORTIC VALVE  AV Area (Vmax):  1.20 cm  AV Area (Vmean):  1.49 cm  AV Area (VTI):   1.19 cm  AV Vmax:      283.00 cm/s  AV Vmean:     163.333 cm/s  AV VTI:      0.561 m  AV Peak Grad:   32.0 mmHg  AV Mean Grad:   17.0 mmHg  LVOT Vmax:     69.15 cm/s  LVOT Vmean:    49.450 cm/s  LVOT VTI:     0.136 m  LVOT/AV VTI ratio: 0.24    AORTA  Ao Root diam: 3.80 cm  Ao Asc diam: 3.70 cm   MITRAL VALVE  MV Area (PHT):       SHUNTS  MV Decel Time: 393 msec  Systemic VTI: 0.14 m  MV E velocity: 43.30 cm/s Systemic Diam: 2.50 cm  MV A  velocity: 86.10 cm/s  MV E/A ratio: 0.50   EKG:  EKG is ordered today. The ekg ordered today demonstrates sinus  Recent Labs: 08/24/2020: ALT 47; BUN 20; Creatinine 1.14; Hemoglobin 14.5; Platelets 168; Potassium 3.6; Sodium 141   Lipid  Panel Followed in primary care   Wt Readings from Last 3 Encounters:  09/14/20 187 lb 12.8 oz (85.2 kg)  08/31/20 192 lb 1.6 oz (87.1 kg)  08/11/20 197 lb 1.5 oz (89.4 kg)     Assessment and Plan:   1. CAD with stable angina: He is known to have moderate CAD by cath in 2011. He has rare chest pains. Continue ASA, statin and Imdur. [facility] test [facility] last week. He will send results to me to review.   2. HYPERTENSION: BP is well controlled. 846 systolic at home. No changes     3. HYPERLIPIDEMIA: Lipids followed in primary care. LDL 68. Continue statin   4. Aortic stenosis: Moderate to severe AS by echo in February 2022. Repeat echo February 2023.      5. Amyloidosis: He is being followed in oncology. He has had chemotherapy and a stem cell transplant. He is in remission.   6. Pulmonary embolism: No longer on Eliquis. Follow up in primary care.   Current medicines are reviewed at length with the patient today.  The patient does not have concerns regarding medicines.  The following changes have been made:  no change  Labs/ tests ordered today include:   Orders Placed This Encounter  Procedures  . EKG 12-Lead    Disposition:   F/U with me in 12 months.   Signed, [name], MD 09/14/2020 3:47 PM    [facility] [place], [place], [place]  [address] Phone: [phone]; Fax: [phone]

## 2020-09-14 NOTE — Patient Instructions (Signed)
Medication Instructions:  No changes *If you need a refill on your cardiac medications before your next appointment, please call your pharmacy*   Lab Work: none If you have labs (blood work) drawn today and your tests are completely normal, you will receive your results only by: . MyChart Message (if you have MyChart) OR . A paper copy in the mail If you have any lab test that is abnormal or we need to change your treatment, we will call you to review the results.   Testing/Procedures: none   Follow-Up: At [facility], you and your health needs are our priority.  As part of our continuing mission to provide you with exceptional heart care, we have created designated Provider Care Teams.  These Care Teams include your primary Cardiologist (physician) and Advanced Practice Providers (APPs -  Physician Assistants and Nurse Practitioners) who all work together to provide you with the care you need, when you need it.  We recommend signing up for the patient portal called "MyChart".  Sign up information is provided on this After Visit Summary.  MyChart is used to connect with patients for Virtual Visits (Telemedicine).  Patients are able to view lab/test results, encounter notes, upcoming appointments, etc.  Non-urgent messages can be sent to your provider as well.   To learn more about what you can do with MyChart, go to [email or web address].    Your next appointment:   6 month(s)  The format for your next appointment:   In Person  Provider:   You may see Christopher McAlhany, MD or one of the following Advanced Practice Providers on your designated Care Team:    Dayna Dunn, [name]  Michele Lenze, [name]  Other Instructions   

## 2020-09-15 ENCOUNTER — Encounter: Payer: Self-pay | Admitting: Physician Assistant

## 2020-09-15 ENCOUNTER — Encounter (HOSPITAL_COMMUNITY): Payer: Self-pay | Admitting: Surgery

## 2020-09-15 DIAGNOSIS — R700 Elevated erythrocyte sedimentation rate: Secondary | ICD-10-CM | POA: Diagnosis present

## 2020-09-15 DIAGNOSIS — R519 Headache, unspecified: Secondary | ICD-10-CM | POA: Diagnosis present

## 2020-09-15 NOTE — Progress Notes (Signed)
DUE TO COVID-19 ONLY ONE VISITOR IS ALLOWED TO COME WITH YOU AND STAY IN THE WAITING ROOM ONLY DURING PRE OP AND PROCEDURE DAY OF SURGERY. THE 1 VISITOR  MAY VISIT WITH YOU AFTER SURGERY IN YOUR PRIVATE ROOM DURING VISITING HOURS ONLY!  YOU NEED TO HAVE A COVID 19 TEST ON_______ @_______ , THIS TEST MUST BE DONE BEFORE SURGERY,  COVID TESTING SITE [address] [place] [address], IT IS ON THE RIGHT GOING OUT [address] APPROXIMATELY  2 MINUTES PAST [employer] ON THE RIGHT. ONCE YOUR COVID TEST IS COMPLETED,  PLEASE BEGIN THE QUARANTINE INSTRUCTIONS AS OUTLINED IN YOUR HANDOUT.                Alex [name].  09/15/2020   Your procedure is scheduled on:     09/17/2020   Report to [facility] Main  Entrance   Report to admitting at0900AM      AM     Call this number if you have problems the morning of surgery [phone]    REMEMBER: NO  SOLID FOOD CANDY OR GUM AFTER MIDNIGHT. CLEAR LIQUIDS UNTIL     0800AM       . NOTHING BY MOUTH EXCEPT CLEAR LIQUIDS UNTIL     0800AM    . PLEASE FINISH ENSURE DRINK PER SURGEON ORDER  WHICH NEEDS TO BE COMPLETED AT  0800AM   .      CLEAR LIQUID DIET   Foods Allowed                                                                    Coffee and tea, regular and decaf                            Fruit ices (not with fruit pulp)                                      Iced Popsicles                                    Carbonated beverages, regular and diet                                    Cranberry, grape and apple juices Sports drinks like Gatorade Lightly seasoned clear broth or consume(fat free) Sugar, honey syrup ___________________________________________________________________      BRUSH YOUR TEETH MORNING OF SURGERY AND RINSE YOUR MOUTH OUT, NO CHEWING GUM CANDY OR MINTS.     Take these medicines the morning of surgery with A SIP OF WATER:    FLOOMAX, PROTONIX, IMDUR, INHALERS AS USUAL AND BRING,  FLONASE, COREG  DO NOT TAKE ANY DIABETIC MEDICATIONS DAY OF YOUR SURGERY                               You may not have any metal on your body including  hair pins and              piercings  Do not wear jewelry, make-up, lotions, powders or perfumes, deodorant             Do not wear nail polish on your fingernails.  Do not shave  48 hours prior to surgery.              Men may shave face and neck.   Do not bring valuables to the hospital. [name].  Contacts, dentures or bridgework may not be worn into surgery.  Leave suitcase in the car. After surgery it may be brought to your room.     Patients discharged the day of surgery will not be allowed to drive home. IF YOU ARE HAVING SURGERY AND GOING HOME THE SAME DAY, YOU MUST HAVE AN ADULT TO DRIVE YOU HOME AND BE WITH YOU FOR 24 HOURS. YOU MAY GO HOME BY TAXI OR UBER OR ORTHERWISE, BUT AN ADULT MUST ACCOMPANY YOU HOME AND STAY WITH YOU FOR 24 HOURS.  Name and phone number of your driver:  Special Instructions: N/A              Please read over the following fact sheets you were given: _____________________________________________________________________  [facility] - Preparing for Surgery Before surgery, you can play an important role.  Because skin is not sterile, your skin needs to be as free of germs as possible.  You can reduce the number of germs on your skin by washing with CHG (chlorahexidine gluconate) soap before surgery.  CHG is an antiseptic cleaner which kills germs and bonds with the skin to continue killing germs even after washing. Please DO NOT use if you have an allergy to CHG or antibacterial soaps.  If your skin becomes reddened/irritated stop using the CHG and inform your nurse when you arrive at Short Stay. Do not shave (including legs and underarms) for at least 48 hours prior to the first CHG shower.  You may shave your face/neck. Please follow these instructions  carefully:  1.  Shower with CHG Soap the night before surgery and the  morning of Surgery.  2.  If you choose to wash your hair, wash your hair first as usual with your  normal  shampoo.  3.  After you shampoo, rinse your hair and body thoroughly to remove the  shampoo.                           4.  Use CHG as you would any other liquid soap.  You can apply chg directly  to the skin and wash                       Gently with a scrungie or clean washcloth.  5.  Apply the CHG Soap to your body ONLY FROM THE NECK DOWN.   Do not use on face/ open                           Wound or open sores. Avoid contact with eyes, ears mouth and genitals (private parts).                       Wash face,  Genitals (private parts) with  your normal soap.             6.  Wash thoroughly, paying special attention to the area where your surgery  will be performed.  7.  Thoroughly rinse your body with warm water from the neck down.  8.  DO NOT shower/wash with your normal soap after using and rinsing off  the CHG Soap.                9.  Pat yourself dry with a clean towel.            10.  Wear clean pajamas.            11.  Place clean sheets on your bed the night of your first shower and do not  sleep with pets. Day of Surgery : Do not apply any lotions/deodorants the morning of surgery.  Please wear clean clothes to the hospital/surgery center.  FAILURE TO FOLLOW THESE INSTRUCTIONS MAY RESULT IN THE CANCELLATION OF YOUR SURGERY PATIENT SIGNATURE_________________________________  NURSE SIGNATURE__________________________________  ________________________________________________________________________

## 2020-09-15 NOTE — H&P (Signed)
General Surgery [facility]  Alex [name] DOB: [birth year] Married / Language: English / Race: White Male   History of Present Illness   The patient is a 80 year old male who presents with a complaint of Muscle pain.  CHIEF COMPLAINT: evaluate for temporal artery biopsy, right  Patient is referred by Dr. Carmie Kanner for surgical evaluation and performance of temporal artery biopsy to rule out temporal arteritis. Patient has had symptoms mainly on the right side of the head and face. He complains of headache. He complains of tenderness over the parietal scalp. He has had no changes in vision. Laboratory studies show an elevated erythrocyte sedimentation rate of 60. Patient now presents to discuss temporal artery biopsy. Patient has a history of pulmonary embolism due to Covid. He has now off of anticoagulation. He is followed by cardiology for coronary artery disease. His last cardiac catheterization was in 2021. Patient is retired.   Problem List/Past Medical  TEMPORAL PAIN (R51.9)   Past Surgical History  Foot Surgery  Bilateral. Knee Surgery  Left. Shoulder Surgery  Bilateral. TURP  Vasectomy   Allergies Ramipril *ANTIHYPERTENSIVES*  Cough.  Medication History  Nitroglycerin (0.4MG Tab Sublingual, Sublingual) Active. Tamsulosin HCl (0.4MG Capsule, Oral) Active. Vitamin C (Oral) Specific strength unknown - Active. Atorvastatin Calcium (40MG Tablet, Oral) Active. Aspirin (81MG Tablet, Oral) Active. B Complex (Oral) Active. Cholecalciferol (25 MCG(1000 UT) Tablet Chewable, Oral) Active. Zetia (10MG Tablet, Oral) Active. Flonase (50MCG/DOSE Inhaler, Nasal) Active. Folic Acid (1MG Tablet, Oral) Active. Furosemide (40MG Tablet, Oral) Active. Isosorbide Mononitrate (Oral) Specific strength unknown - Active. Multi Vitamin (Oral) Active. Macrobid (100MG Capsule, Oral) Active. Protonix (40MG Packet, Oral)  Active. Potassium Chloride (20MEQ Packet, Oral) Active. Kenalog (0.1% Cream, External) Active. Vitamin B-12 (500MCG Tablet, Oral) Active. Medications Reconciled  Social History  Alcohol use  Occasional alcohol use. Tobacco use  Former smoker.  Family History  Diabetes Mellitus  Father. Heart Disease  Father. Heart disease in male family member before age 91   Other Problems  Arthritis  Back Pain  Cancer  Gastroesophageal Reflux Disease  Heart murmur  High blood pressure  Home Oxygen Use  Prostate Cancer  Seizure Disorder  Sleep Apnea  Umbilical Hernia Repair     Review of Systems  General Present- Appetite Loss. Not Present- Chills, Fatigue, Fever, Night Sweats, Weight Gain and Weight Loss. Skin Present- Dryness. Not Present- Change in Wart/Mole, Hives, Jaundice, New Lesions, Non-Healing Wounds, Rash and Ulcer. HEENT Present- Hearing Loss, Seasonal Allergies and Wears glasses/contact lenses. Not Present- Earache, Hoarseness, Nose Bleed, Oral Ulcers, Ringing in the Ears, Sinus Pain, Sore Throat, Visual Disturbances and Yellow Eyes. Cardiovascular Present- Chest Pain and Leg Cramps. Not Present- Difficulty Breathing Lying Down, Palpitations, Rapid Heart Rate, Shortness of Breath and Swelling of Extremities. Gastrointestinal Not Present- Abdominal Pain, Bloating, Bloody Stool, Change in Bowel Habits, Chronic diarrhea, Constipation, Difficulty Swallowing, Excessive gas, Gets full quickly at meals, Hemorrhoids, Indigestion, Nausea, Rectal Pain and Vomiting. Male Genitourinary Not Present- Blood in Urine, Change in Urinary Stream, Frequency, Impotence, Nocturia, Painful Urination, Urgency and Urine Leakage.  Vitals  Weight: 190.13 lb Height: 71in Body Surface Area: 2.06 m Body Mass Index: 26.52 kg/m  Pulse: 35 (Regular)  P.OX: 76% (Room air) BP: 128/96(Sitting, Left Arm, Standard)  Physical Exam   GENERAL APPEARANCE Development:  normal Nutritional status: normal Gross deformities: none  SKIN Rash, lesions, ulcers: none Induration, erythema: none Nodules: none palpable  EYES Conjunctiva and lids: normal Pupils:  equal and reactive Iris: normal bilaterally  EARS, NOSE, MOUTH, THROAT External ears: no lesion or deformity External nose: no lesion or deformity Hearing: grossly normal; wears hearing aids Due to Covid-19 pandemic, patient is wearing a mask.  NECK Symmetric: yes Trachea: midline Thyroid: no palpable nodules in the thyroid bed Well-healed surgical incision right submandibular area  CHEST Respiratory effort: normal Retraction or accessory muscle use: no Breath sounds: normal bilaterally Rales, rhonchi, wheeze: none  CARDIOVASCULAR Auscultation: regular rhythm, normal rate Murmurs: none Pulses: radial pulse 2+ palpable Lower extremity edema: none  MUSCULOSKELETAL Station and gait: normal Digits and nails: no clubbing or cyanosis Muscle strength: grossly normal all extremities Range of motion: grossly normal all extremities Deformity: none  LYMPHATIC Cervical: none palpable Supraclavicular: none palpable  PSYCHIATRIC Oriented to person, place, and time: yes Mood and affect: normal for situation Judgment and insight: appropriate for situation    Assessment & Plan  HEADACHE, TEMPORAL (R51.9) TEMPORAL PAIN (R51.9)  Patient is referred by his primary care physician for surgical evaluation for temporal artery biopsy to rule out temporal arteritis.  Patient is referred for temporal artery biopsy. He has complained of headaches. Complains of tenderness to palpation over the right parietal scalp. ESR is elevated at 60.  I discussed temporal artery biopsy with the patient. I explained that we would make a small incision anterior to the right ear. We would remove a small segment of the artery and sent this to pathology for evaluation. I described the surgical wound to be  anticipated. This would be done as an outpatient surgical procedure under anesthesia. Patient understands and agrees to proceed.  The risks and benefits of the procedure have been discussed at length with the patient. The patient understands the proposed procedure, potential alternative treatments, and the course of recovery to be expected. All of the patient's questions have been answered at this time. The patient wishes to proceed with surgery.  Armandina Gemma, MD [facility] Office: [phone]

## 2020-09-16 ENCOUNTER — Encounter (HOSPITAL_COMMUNITY): Payer: Self-pay

## 2020-09-16 ENCOUNTER — Other Ambulatory Visit: Payer: Self-pay

## 2020-09-16 ENCOUNTER — Telehealth: Payer: Self-pay | Admitting: Cardiovascular Disease

## 2020-09-16 ENCOUNTER — Encounter (HOSPITAL_COMMUNITY)
Admission: RE | Admit: 2020-09-16 | Discharge: 2020-09-16 | Disposition: A | Discharge status: Home / Self Care | Payer: Medicare Other | Source: Ambulatory Visit | Attending: Surgery | Admitting: Surgery

## 2020-09-16 DIAGNOSIS — N183 Chronic kidney disease, stage 3 unspecified: Secondary | ICD-10-CM | POA: Diagnosis not present

## 2020-09-16 DIAGNOSIS — Z7982 Long term (current) use of aspirin: Secondary | ICD-10-CM | POA: Diagnosis not present

## 2020-09-16 DIAGNOSIS — G4733 Obstructive sleep apnea (adult) (pediatric): Secondary | ICD-10-CM | POA: Diagnosis not present

## 2020-09-16 DIAGNOSIS — Z01812 Encounter for preprocedural laboratory examination: Secondary | ICD-10-CM | POA: Insufficient documentation

## 2020-09-16 DIAGNOSIS — Z8546 Personal history of malignant neoplasm of prostate: Secondary | ICD-10-CM | POA: Diagnosis not present

## 2020-09-16 DIAGNOSIS — K219 Gastro-esophageal reflux disease without esophagitis: Secondary | ICD-10-CM | POA: Insufficient documentation

## 2020-09-16 DIAGNOSIS — Z87891 Personal history of nicotine dependence: Secondary | ICD-10-CM | POA: Insufficient documentation

## 2020-09-16 DIAGNOSIS — I2510 Atherosclerotic heart disease of native coronary artery without angina pectoris: Secondary | ICD-10-CM | POA: Diagnosis not present

## 2020-09-16 DIAGNOSIS — I129 Hypertensive chronic kidney disease with stage 1 through stage 4 chronic kidney disease, or unspecified chronic kidney disease: Secondary | ICD-10-CM | POA: Insufficient documentation

## 2020-09-16 DIAGNOSIS — R519 Headache, unspecified: Secondary | ICD-10-CM | POA: Diagnosis not present

## 2020-09-16 DIAGNOSIS — Z79899 Other long term (current) drug therapy: Secondary | ICD-10-CM | POA: Diagnosis not present

## 2020-09-16 HISTORY — DX: Dyspnea, unspecified: R06.00

## 2020-09-16 HISTORY — DX: Cardiac murmur, unspecified: R01.1

## 2020-09-16 HISTORY — DX: Headache, unspecified: R51.9

## 2020-09-16 LAB — BASIC METABOLIC PANEL
Anion gap: 9 (ref 5–15)
BUN: 22 mg/dL (ref 8–23)
CO2: 29 mmol/L (ref 22–32)
Calcium: 9.1 mg/dL (ref 8.9–10.3)
Chloride: 97 mmol/L — ABNORMAL LOW (ref 98–111)
Creatinine, Ser: 0.82 mg/dL (ref 0.61–1.24)
GFR, Estimated: >60 mL/min (ref >60)
Glucose, Bld: 119 mg/dL — ABNORMAL HIGH (ref 70–99)
Potassium: 4.1 mmol/L (ref 3.5–5.1)
Sodium: 135 mmol/L (ref 135–145)

## 2020-09-16 LAB — CBC
HCT: 44.7 % (ref 39.0–52.0)
Hemoglobin: 15 g/dL (ref 13.0–17.0)
MCH: 31.8 pg (ref 26.0–34.0)
MCHC: 33.6 g/dL (ref 30.0–36.0)
MCV: 94.7 fL (ref 80.0–100.0)
Platelets: 254 10*3/uL (ref 150–400)
RBC: 4.72 MIL/uL (ref 4.22–5.81)
RDW: 12.3 % (ref 11.5–15.5)
WBC: 11.4 10*3/uL — ABNORMAL HIGH (ref 4.0–10.5)
nRBC: 0 % (ref 0.0–0.2)

## 2020-09-16 NOTE — Telephone Encounter (Deleted)
Pat

## 2020-09-16 NOTE — Progress Notes (Signed)
Anesthesia Chart Review   Case: [id number] Date/Time: 09/17/20 1035   Procedure: RIGHT TEMPORAL ARTERY BIOPSY (Right )   Anesthesia type: General   Pre-op diagnosis: HEADACHE TEMPORAL PAIN   Location: [facility] ROOM 03 / [facility]   Surgeons: Armandina Gemma, MD      DISCUSSION:79 y.o. 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 Armandina Gemma.   Pt with post COVID pulmonary fibrosis.  Admitted 10/2018 due to [place].  Discharged on supplemental O2.  Continues to have DOE with use of O2 at bedtime and prn with activity.   Last seen by cardiology 09/14/2020. Per OV note pt stable, 12 month follow up recommended. Stress test 09/08/2020 with no reversible ischemia.   VS: BP 137/77   Pulse 91   Temp 36.8 C (Oral)   Resp 18   Ht 5\' 11"  (1.803 m)   Wt 86.6 kg   SpO2 100%   BMI 26.64 kg/m   PROVIDERS: Ginger Organ., MD is PCP   Vaughan Browner, MD is Pulmonologist  LABS: Labs reviewed: Acceptable for surgery. (all labs ordered are listed, but only abnormal results are displayed)  Labs Reviewed  CBC - Abnormal; Notable for the following components:      Result Value   WBC 11.4 (*)    All other components within normal limits  BASIC METABOLIC PANEL     IMAGES:   EKG: 09/14/2020 Rate 89 bpm  Sinus bradycardia   CV: Stress Test (in Care Everywhere) 09/09/2020 Impression:  1. No evidence of reversible ischemia.   2. Elevated TID is concerning for multivessel disease.   Echo 06/05/20 1. Left ventricular ejection fraction, by estimation, is 60 to 65%. The  left ventricle has normal function. The left  ventricle has no regional  wall motion abnormalities. There is mild concentric left ventricular  hypertrophy. Left ventricular diastolic  parameters are consistent with Grade I diastolic dysfunction (impaired  relaxation).  2. Right ventricular systolic function is normal. The right ventricular  size is normal.  3. The mitral valve is normal in structure. No evidence of mitral valve  regurgitation. No evidence of mitral stenosis.  4. The aortic valve is calcified. There is mild calcification of the  aortic valve. There is mild thickening of the aortic valve. Aortic valve  regurgitation is trivial. Moderate aortic valve stenosis. Aortic valve  area, by VTI measures 1.19 cm. Aortic  valve mean gradient measures 17.0 mmHg. Aortic valve Vmax measures 2.83  m/s.  5. Aortic dilatation noted. There is mild dilatation of the aortic root,  measuring 38 mm. There is mild dilatation of the ascending aorta,  measuring 37 mm.  6. The inferior vena cava is normal in size with greater than 50%  respiratory variability, suggesting right atrial pressure of 3 mmHg.  7. Compared to prior echo AS appears to have progressed. The AVA (VTI)  has decreased from 1.5 to 1.19cm2, P/M transaortic gradients have  increased fro 20/11 to 32/36mmHg, DI has decreased from 0.35 to 0.24. The  stroke volume index is low at 32. Although  the mean AVG is more consistent with mild AS, the dimensionless index is  low  and in setting of low SV this may represent low flow low gradient  aortic stenosis that is at least moderatein severity.   Stress Test 11/14/2014  Nuclear stress EF: 67%.  The left ventricular ejection fraction is hyperdynamic (>65%).  There was no ST segment deviation noted during stress.  The study is normal.  This is a low risk study.   Normal study with no evidence of prior infarct or ischemia.  Past Medical History:  Diagnosis Date  . AL amyloidosis [facility]) oncologist-- dr [name]   Renal  and Systemic , dx 04/ 2018;  renal bx 08-05-2016 and bone marrow bx 08-29-2016;  8 month chemotherapy then stell cell transplant 12/ 2018 then chemo again;  since then been on oral chemo daily (11-07-2019 per pt has been on hold until after prostate seed implants)  . Arthritis   . CAD (coronary artery disease) cardiologist-- dr [name];  also followed by cardiologist w/ [facility] in [place], dr [name]   moderate nonobstrucive disease by cath 2011; Lexiscan Myoview 6/14--Normal study, no scar or ischemia, EF 62%;  11-14-2014 nuclear study low risk w/ normal perfusion, nuclear ef 67%  . CKD (chronic kidney disease), stage III ([place])   . Diverticulosis of colon   . Dyspnea    WITH EXERTION   . GERD (gastroesophageal reflux disease)   . H/O stem cell transplant ([place]) 03/2017  . Headache   . Heart murmur   . Hiatal hernia   . History of 2019 novel coronavirus disease (COVID-19) 09/2018   [facility], hospital admission 10-28-2019 respiratory failure/ pneumometrostinom/ PE ;  was not intubated , treatment with remdesivir/ steroids/ actemra/ plasma  . History of basal cell carcinoma (BCC)   . History of colon polyps   . History of MRSA infection    culture positive  . History of pulmonary embolus (PE) 10/2018   with covid 19, completed treatment with eliquis  . History of seizures    11-07-2019  per pt last one 1970s, unknown cause  . History of vertebral compression fracture 1988   lumbar  . Hyperlipidemia   . Hypertension   . Neuropathy due to chemotherapeutic drug (HCC)    fingers tips and feet  . Nocturia   . On supplemental oxygen therapy    post covid 19;   11-07-2019 pt uses O2 every night 2L via [place],  and uses supplements during the day when walking around neighborhood and goes out to appointments/ store that he has to walk a distance  . OSA (obstructive sleep apnea)    study in epic 03-18-2005 mild to moderate   (11-07-2019  per pt used cpap up until when he  lost alot of weight, stoped approx. 2012  . Prostate cancer [facility]) urologist--- dr pace/  oncology-- dr Tammi Klippel   dx 03/ 2021  . Pulmonary fibrosis ([place]) 09/2018   pulmonologist-- dr Vaughan Browner;   post covid 19  . SOB (shortness of breath) on exertion    11-07-2019  per pt walks around neighborhood for 30 minutes twice daily, he does have to stop frequently d/t sob and since weather has been hot he uses his supplemental oxygen;  stated he has a pulse ox. at home , when he is sitting down O2 sat 96 -97% on RA when up walking around the house O2 sat 93-94% on RA;  pt stated he carry's his O2 w/ him when he goes to doctor appts/store d/t distance  . Wears glasses   .  Wears hearing aid in both ears     Past Surgical History:  Procedure Laterality Date  . CARDIAC CATHETERIZATION  05-21-1999  and 12-29-2000  @[facility]   dr Maryjo [place] nuclear study;  moderate nonobstructive disease involving LAD and LCx;  second done due to in Reversal research trial  . CARDIAC CATHETERIZATION  07/07/2009  dr [name]   nonobstructive disease w/ normal LVF  . CARPAL TUNNEL RELEASE Left 1994  . CERVICAL FUSION  1988   C2 -- 4  . CYSTOSCOPY N/A 11/13/2019   Procedure: CYSTOSCOPY FLEXIBLE;  Surgeon: [name], MD;  Location: [facility];  Service: Urology;  Laterality: N/A;  no seeds detected per Dr. Claudia Desanctis  . FOOT SURGERY Bilateral x3 last one 1980s   bunionectomy great toe--- x2 right and x1 left  . KNEE ARTHROSCOPY Left 1996  . NASAL SEPTUM SURGERY  x3 last one 08-09-2010 @ [facility]  . PAROTIDECTOMY Right 03/09/2017  . RADIOACTIVE SEED IMPLANT N/A 11/13/2019   Procedure: RADIOACTIVE SEED IMPLANT/BRACHYTHERAPY IMPLANT;  Surgeon: [name], MD;  Location[facility];  Service: Urology;  Laterality: N/A;  ONLY NEEDS 90 MIN FOR ALL PROCEDURES  . ROTATOR CUFF REPAIR Bilateral x1 rigth (unsure date);  x2 left last one 09-07-2009 @[facility] ;  right  . SPACE OAR INSTILLATION N/A 11/13/2019    Procedure: SPACE OAR INSTILLATION;  Surgeon: [name], MD;  Location: [facility];  Service: Urology;  Laterality: N/A;  . TRIGGER FINGER RELEASE Bilateral x2  last one 2001 approx.   x1 finger left hand;  x2 fingers right hand  . UMBILICAL HERNIA REPAIR  01/19/2011  . VASECTOMY  approx. 1968 (with anesthesia)    MEDICATIONS: . traMADol (ULTRAM) 50 MG tablet  . acyclovir (ZOVIRAX) 200 MG capsule  . Ascorbic Acid (VITAMIN C) 1000 MG tablet  . aspirin EC 81 MG tablet  . atorvastatin (LIPITOR) 40 MG tablet  . b complex vitamins capsule  . carvedilol (COREG) 6.25 MG tablet  . Cholecalciferol 25 MCG (1000 UT) tablet  . fluticasone (FLONASE) 50 MCG/ACT nasal spray  . folic acid (FOLVITE) 1 MG tablet  . furosemide (LASIX) 40 MG tablet  . Ipratropium-Albuterol (COMBIVENT RESPIMAT) 20-100 MCG/ACT AERS respimat  . isosorbide mononitrate (IMDUR) 15 mg TB24 24 hr tablet  . Multiple Vitamin (MULTI-VITAMINS) TABS  . nitroGLYCERIN (NITROSTAT) 0.4 MG SL tablet  . pantoprazole (PROTONIX) 40 MG tablet  . potassium chloride SA (KLOR-CON) 20 MEQ tablet  . tamsulosin (FLOMAX) 0.4 MG CAPS capsule  . vitamin B-12 (CYANOCOBALAMIN) 500 MCG tablet   No current facility-administered medications for this encounter.    Konrad Felix, [name] [facility]-Surgical Testing [phone]

## 2020-09-16 NOTE — Progress Notes (Addendum)
Anesthesia Review:  PCP:   DR [name]  Cardiologist : DR Blima Rich 09/14/20  Chest x-ray :04/02/2020  EKG : 09/30/19 and   09/14/20  Echo : 06/05/20  PFT- 04/02/2020  Stress test: Cardiac Cath :  Activity level:  Can do steps slowly  Sleep Study/ CPAP : hx of cpap not using currently  Fasting Blood Sugar :      / Checks Blood Sugar -- times a day:   Blood Thinner/ Instructions /Last Dose: ASA / Instructions/ Last Dose :  Last seizure 30 years ago per pt  10/28/2018- positive for covid in hospital until 11/17/19 per pt and wife only has 47 of lungs working was on oxygen at home until 6 weeks ago per wife

## 2020-09-17 ENCOUNTER — Ambulatory Visit (HOSPITAL_COMMUNITY)
Admission: RE | Admit: 2020-09-17 | Discharge: 2020-09-17 | Disposition: A | Discharge status: Home / Self Care | Payer: Medicare Other | Attending: Surgery | Admitting: Surgery

## 2020-09-17 ENCOUNTER — Encounter (HOSPITAL_COMMUNITY): Payer: Self-pay | Admitting: Surgery

## 2020-09-17 ENCOUNTER — Encounter (HOSPITAL_COMMUNITY)
Admission: RE | Disposition: A | Discharge status: Home / Self Care | Payer: Self-pay | Source: Home / Self Care | Attending: Surgery

## 2020-09-17 ENCOUNTER — Ambulatory Visit (HOSPITAL_COMMUNITY): Payer: Medicare Other | Admitting: Anesthesiology

## 2020-09-17 ENCOUNTER — Ambulatory Visit (HOSPITAL_COMMUNITY): Payer: Medicare Other | Admitting: Physician Assistant

## 2020-09-17 DIAGNOSIS — Z7982 Long term (current) use of aspirin: Secondary | ICD-10-CM | POA: Insufficient documentation

## 2020-09-17 DIAGNOSIS — Z87891 Personal history of nicotine dependence: Secondary | ICD-10-CM | POA: Diagnosis not present

## 2020-09-17 DIAGNOSIS — K219 Gastro-esophageal reflux disease without esophagitis: Secondary | ICD-10-CM | POA: Insufficient documentation

## 2020-09-17 DIAGNOSIS — M316 Other giant cell arteritis: Secondary | ICD-10-CM | POA: Diagnosis not present

## 2020-09-17 DIAGNOSIS — Z86711 Personal history of pulmonary embolism: Secondary | ICD-10-CM | POA: Insufficient documentation

## 2020-09-17 DIAGNOSIS — Z8616 Personal history of COVID-19: Secondary | ICD-10-CM | POA: Diagnosis not present

## 2020-09-17 DIAGNOSIS — R519 Headache, unspecified: Secondary | ICD-10-CM | POA: Diagnosis present

## 2020-09-17 DIAGNOSIS — G40909 Epilepsy, unspecified, not intractable, without status epilepticus: Secondary | ICD-10-CM | POA: Insufficient documentation

## 2020-09-17 DIAGNOSIS — Z888 Allergy status to other drugs, medicaments and biological substances status: Secondary | ICD-10-CM | POA: Diagnosis not present

## 2020-09-17 DIAGNOSIS — Z8249 Family history of ischemic heart disease and other diseases of the circulatory system: Secondary | ICD-10-CM | POA: Diagnosis not present

## 2020-09-17 DIAGNOSIS — I2510 Atherosclerotic heart disease of native coronary artery without angina pectoris: Secondary | ICD-10-CM | POA: Insufficient documentation

## 2020-09-17 DIAGNOSIS — Z79899 Other long term (current) drug therapy: Secondary | ICD-10-CM | POA: Diagnosis not present

## 2020-09-17 DIAGNOSIS — R700 Elevated erythrocyte sedimentation rate: Secondary | ICD-10-CM | POA: Diagnosis present

## 2020-09-17 DIAGNOSIS — G4489 Other headache syndrome: Secondary | ICD-10-CM | POA: Diagnosis present

## 2020-09-17 DIAGNOSIS — Z8546 Personal history of malignant neoplasm of prostate: Secondary | ICD-10-CM | POA: Diagnosis not present

## 2020-09-17 DIAGNOSIS — Z833 Family history of diabetes mellitus: Secondary | ICD-10-CM | POA: Insufficient documentation

## 2020-09-17 HISTORY — PX: ARTERY BIOPSY: SHX891

## 2020-09-17 SURGERY — BIOPSY TEMPORAL ARTERY
Anesthesia: General | Laterality: Right

## 2020-09-17 MED ORDER — LACTATED RINGERS IV SOLN: INTRAVENOUS | Status: DC

## 2020-09-17 MED ORDER — ONDANSETRON HCL 4 MG/2ML IJ SOLN: 4.0000 mg | Freq: Once | INTRAMUSCULAR | Status: DC | PRN

## 2020-09-17 MED ORDER — CHLORHEXIDINE GLUCONATE CLOTH 2 % EX PADS: 6.0000 | MEDICATED_PAD | Freq: Once | CUTANEOUS | Status: DC

## 2020-09-17 MED ORDER — TRAMADOL HCL 50 MG PO TABS: 50.0000 mg | ORAL_TABLET | Freq: Four times a day (QID) | ORAL | 0 refills | Status: DC | PRN

## 2020-09-17 MED ORDER — BUPIVACAINE-EPINEPHRINE (PF) 0.25% -1:200000 IJ SOLN
INTRAMUSCULAR | Status: AC
  Filled 2020-09-17: qty 30

## 2020-09-17 MED ORDER — BUPIVACAINE-EPINEPHRINE 0.25% -1:200000 IJ SOLN
INTRAMUSCULAR | Status: DC | PRN
  Administered 2020-09-17: 10 mL

## 2020-09-17 MED ORDER — PROPOFOL 10 MG/ML IV BOLUS
INTRAVENOUS | Status: DC | PRN
  Administered 2020-09-17: 130 mg via INTRAVENOUS

## 2020-09-17 MED ORDER — FENTANYL CITRATE (PF) 100 MCG/2ML IJ SOLN
INTRAMUSCULAR | Status: AC
  Filled 2020-09-17: qty 2

## 2020-09-17 MED ORDER — 0.9 % SODIUM CHLORIDE (POUR BTL) OPTIME
TOPICAL | Status: DC | PRN
  Administered 2020-09-17: 1000 mL

## 2020-09-17 MED ORDER — PHENYLEPHRINE 40 MCG/ML (10ML) SYRINGE FOR IV PUSH (FOR BLOOD PRESSURE SUPPORT)
PREFILLED_SYRINGE | INTRAVENOUS | Status: DC | PRN
  Administered 2020-09-17: 120 ug via INTRAVENOUS

## 2020-09-17 MED ORDER — AMISULPRIDE (ANTIEMETIC) 5 MG/2ML IV SOLN: 10.0000 mg | Freq: Once | INTRAVENOUS | Status: DC | PRN

## 2020-09-17 MED ORDER — CEFAZOLIN SODIUM-DEXTROSE 2-4 GM/100ML-% IV SOLN
INTRAVENOUS | Status: AC
  Filled 2020-09-17: qty 100

## 2020-09-17 MED ORDER — ROCURONIUM BROMIDE 10 MG/ML (PF) SYRINGE
PREFILLED_SYRINGE | INTRAVENOUS | Status: AC
  Filled 2020-09-17: qty 10

## 2020-09-17 MED ORDER — FENTANYL CITRATE (PF) 100 MCG/2ML IJ SOLN
INTRAMUSCULAR | Status: DC | PRN
  Administered 2020-09-17: 25 ug via INTRAVENOUS
  Administered 2020-09-17: 75 ug via INTRAVENOUS

## 2020-09-17 MED ORDER — CEFAZOLIN SODIUM-DEXTROSE 2-4 GM/100ML-% IV SOLN
2.0000 g | INTRAVENOUS | Status: AC
  Administered 2020-09-17: 2 g via INTRAVENOUS

## 2020-09-17 MED ORDER — ONDANSETRON HCL 4 MG/2ML IJ SOLN
INTRAMUSCULAR | Status: DC | PRN
  Administered 2020-09-17: 4 mg via INTRAVENOUS

## 2020-09-17 MED ORDER — LIDOCAINE 2% (20 MG/ML) 5 ML SYRINGE
INTRAMUSCULAR | Status: DC | PRN
  Administered 2020-09-17: 60 mg via INTRAVENOUS

## 2020-09-17 MED ORDER — PROPOFOL 10 MG/ML IV BOLUS
INTRAVENOUS | Status: AC
  Filled 2020-09-17: qty 20

## 2020-09-17 MED ORDER — FENTANYL CITRATE (PF) 100 MCG/2ML IJ SOLN: 25.0000 ug | INTRAMUSCULAR | Status: DC | PRN

## 2020-09-17 MED ORDER — LIDOCAINE 2% (20 MG/ML) 5 ML SYRINGE
INTRAMUSCULAR | Status: AC
  Filled 2020-09-17: qty 5

## 2020-09-17 MED ORDER — OXYCODONE HCL 5 MG PO TABS: 5.0000 mg | ORAL_TABLET | Freq: Once | ORAL | Status: DC | PRN

## 2020-09-17 MED ORDER — OXYCODONE HCL 5 MG/5ML PO SOLN: 5.0000 mg | Freq: Once | ORAL | Status: DC | PRN

## 2020-09-17 MED ORDER — SUGAMMADEX SODIUM 500 MG/5ML IV SOLN
INTRAVENOUS | Status: AC
  Filled 2020-09-17: qty 5

## 2020-09-17 SURGICAL SUPPLY — 42 items
ATTRACTOMAT 16X20 MAGNETIC DRP (DRAPES) ×2 IMPLANT
BLADE HEX COATED 2.75 (ELECTRODE) ×2 IMPLANT
BLADE SURG 15 STRL LF DISP TIS (BLADE) ×2 IMPLANT
BLADE SURG 15 STRL SS (BLADE) ×4
BLADE SURG SZ10 CARB STEEL (BLADE) ×4 IMPLANT
CHLORAPREP W/TINT 26 (MISCELLANEOUS) ×2 IMPLANT
CLIP VESOCCLUDE SM WIDE 6/CT (CLIP) ×4 IMPLANT
COVER SURGICAL LIGHT HANDLE (MISCELLANEOUS) ×2 IMPLANT
COVER WAND RF STERILE (DRAPES) IMPLANT
DERMABOND ADVANCED (GAUZE/BANDAGES/DRESSINGS) ×1
DERMABOND ADVANCED .7 DNX12 (GAUZE/BANDAGES/DRESSINGS) ×1 IMPLANT
DISSECTOR ROUND CHERRY 3/8 STR (MISCELLANEOUS) ×2 IMPLANT
DRAPE LAPAROTOMY T 98X78 PEDS (DRAPES) ×2 IMPLANT
ELECT NEEDLE TIP 2.8 STRL (NEEDLE) IMPLANT
ELECT REM PT RETURN 15FT ADLT (MISCELLANEOUS) ×2 IMPLANT
GAUZE 4X4 16PLY RFD (DISPOSABLE) ×2 IMPLANT
GAUZE SPONGE 4X4 12PLY STRL (GAUZE/BANDAGES/DRESSINGS) IMPLANT
GLOVE SURG LTX SZ8 (GLOVE) ×2 IMPLANT
GLOVE SURG UNDER LTX SZ8 (GLOVE) ×4 IMPLANT
GLOVE SURG UNDER POLY LF SZ7 (GLOVE) ×2 IMPLANT
GOWN STRL REUS W/TWL LRG LVL3 (GOWN DISPOSABLE) ×2 IMPLANT
GOWN STRL REUS W/TWL XL LVL3 (GOWN DISPOSABLE) ×2 IMPLANT
HEMOSTAT SURGICEL 2X14 (HEMOSTASIS) IMPLANT
KIT BASIN OR (CUSTOM PROCEDURE TRAY) ×2 IMPLANT
KIT TURNOVER KIT A (KITS) ×2 IMPLANT
MARKER SKIN DUAL TIP RULER LAB (MISCELLANEOUS) ×2 IMPLANT
NS IRRIG 1000ML POUR BTL (IV SOLUTION) ×2 IMPLANT
PACK BASIC VI WITH GOWN DISP (CUSTOM PROCEDURE TRAY) ×2 IMPLANT
PENCIL SMOKE EVACUATOR (MISCELLANEOUS) ×2 IMPLANT
STAPLER VISISTAT 35W (STAPLE) IMPLANT
STRIP CLOSURE SKIN 1/2X4 (GAUZE/BANDAGES/DRESSINGS) ×2 IMPLANT
SUT SILK 2 0 (SUTURE) ×2
SUT SILK 2-0 18XBRD TIE 12 (SUTURE) ×1 IMPLANT
SUT SILK 3 0 (SUTURE)
SUT SILK 3-0 18XBRD TIE 12 (SUTURE) IMPLANT
SUT VIC AB 3-0 SH 27 (SUTURE)
SUT VIC AB 3-0 SH 27XBRD (SUTURE) IMPLANT
SUT VIC AB 4-0 PS2 27 (SUTURE) ×2 IMPLANT
SYR BULB IRRIG 60ML STRL (SYRINGE) IMPLANT
TOWEL OR 17X26 10 PK STRL BLUE (TOWEL DISPOSABLE) ×2 IMPLANT
TOWEL OR NON WOVEN STRL DISP B (DISPOSABLE) ×2 IMPLANT
WATER STERILE IRR 1000ML POUR (IV SOLUTION) ×2 IMPLANT

## 2020-09-17 NOTE — Transfer of Care (Signed)
Immediate Anesthesia Transfer of Care Note  Patient: Alex [name].  Procedure(s) Performed: RIGHT TEMPORAL ARTERY BIOPSY (Right )  Patient Location: PACU  Anesthesia Type:General  Level of Consciousness: awake, alert  and oriented  Airway & Oxygen Therapy: Patient Spontanous Breathing and Patient connected to face mask oxygen  Post-op Assessment: Report given to RN and Post -op Vital signs reviewed and stable  Post vital signs: Reviewed and stable  Last Vitals:  Vitals Value Taken Time  BP    Temp    Pulse 73 09/17/20 1209  Resp 19 09/17/20 1209  SpO2 100 % 09/17/20 1209  Vitals shown include unvalidated device data.  Last Pain:  Vitals:   09/17/20 0836  TempSrc: Oral  PainSc: 9          Complications: No complications documented.

## 2020-09-17 NOTE — Anesthesia Postprocedure Evaluation (Signed)
Anesthesia Post Note  Patient: Alex [name].  Procedure(s) Performed: RIGHT TEMPORAL ARTERY BIOPSY (Right )     Patient location during evaluation: PACU Anesthesia Type: General Level of consciousness: awake and alert Pain management: pain level controlled Vital Signs Assessment: post-procedure vital signs reviewed and stable Respiratory status: spontaneous breathing, nonlabored ventilation and respiratory function stable Cardiovascular status: blood pressure returned to baseline and stable Postop Assessment: no apparent nausea or vomiting Anesthetic complications: no   No complications documented.  Last Vitals:  Vitals:   09/17/20 1215 09/17/20 1230  BP: (!) 134/95 137/83  Pulse: 77 74  Resp: 18 (!) 21  Temp:    SpO2: 100% 98%    Last Pain:  Vitals:   09/17/20 1230  TempSrc:   PainSc: 0-No pain                 Lidia Collum

## 2020-09-17 NOTE — Anesthesia Procedure Notes (Signed)
Procedure Name: LMA Insertion Date/Time: 09/17/2020 10:48 AM Performed by: [place] Imagine Nest C, CRNA Pre-anesthesia Checklist: Patient identified, Emergency Drugs available, Suction available and Patient being monitored Patient Re-evaluated:Patient Re-evaluated prior to induction Oxygen Delivery Method: Circle system utilized Preoxygenation: Pre-oxygenation with 100% oxygen Induction Type: IV induction Ventilation: Mask ventilation without difficulty LMA: LMA inserted LMA Size: 4.0 Number of attempts: 1 Airway Equipment and Method: Bite block Placement Confirmation: positive ETCO2 Tube secured with: Tape Dental Injury: Teeth and Oropharynx as per pre-operative assessment

## 2020-09-17 NOTE — Anesthesia Preprocedure Evaluation (Signed)
Anesthesia Evaluation  Patient identified by MRN, date of birth, ID band Patient awake    Reviewed: Allergy & Precautions, NPO status , Patient's Chart, lab work & pertinent test results  History of Anesthesia Complications Negative for: history of anesthetic complications  Airway Mallampati: II  TM Distance: >3 FB Neck ROM: Full    Dental  (+) Teeth Intact   Pulmonary shortness of breath (has been off home oxygen for ~2 months), sleep apnea , former smoker,  Post-COVID pulmonary fibrosis Acute COVID infection 10/2018   Pulmonary exam normal        Cardiovascular hypertension, + CAD (nonobstructive by 2011 cath)  Normal cardiovascular exam+ Valvular Problems/Murmurs (moderate (AVA 1.19, MG 17)) AS      Neuro/Psych negative neurological ROS  negative psych ROS   GI/Hepatic Neg liver ROS, GERD  ,  Endo/Other  negative endocrine ROS  Renal/GU Renal InsufficiencyRenal disease (CKD3)   Prostate cancer    Musculoskeletal  (+) Arthritis ,   Abdominal   Peds  Hematology negative hematology ROS (+)   Anesthesia Other Findings  AL amyloidosis s/p chemo and stem cell transplant (2018)  Last seen by cardiology 09/14/2020. Per OV note pt stable, 12 month follow up recommended. Stress test 09/08/2020 with no reversible ischemia.   Echo 06/05/20: EF 60-65%, mild LVH, g1dd, normal RV function, trivial AR, moderate AS, (AVA 1.19, MG 17), mild aortic root/ascending aorta dilatation  Reproductive/Obstetrics                            Anesthesia Physical  Anesthesia Plan  ASA: III  Anesthesia Plan: General   Post-op Pain Management:    Induction: Intravenous  PONV Risk Score and Plan: 2 and Ondansetron, Dexamethasone, Midazolam and Treatment may vary due to age or medical condition  Airway Management Planned: LMA  Additional Equipment: None  Intra-op Plan:   Post-operative Plan: Extubation in  OR  Informed Consent: I have reviewed the patients History and Physical, chart, labs and discussed the procedure including the risks, benefits and alternatives for the proposed anesthesia with the patient or authorized representative who has indicated his/her understanding and acceptance.     Dental advisory given  Plan Discussed with:   Anesthesia Plan Comments:         Anesthesia Quick Evaluation

## 2020-09-17 NOTE — Op Note (Signed)
Operative Note  Pre-operative Diagnosis:  Temporal headaches, rule out temporal arteritis  Post-operative Diagnosis:  same  Surgeon:  Armandina Gemma, MD  Assistant:  none   Procedure:  Right temporal artery biopsy  Anesthesia:  general  Estimated Blood Loss:  minimal  Drains: none         Specimen: segment temporal artery to pathology  Indications:  Patient is referred by Dr. Carmie Kanner for surgical evaluation and performance of temporal artery biopsy to rule out temporal arteritis. Patient has had symptoms mainly on the right side of the head and face. He complains of headache. He complains of tenderness over the parietal scalp. He has had no changes in vision. Laboratory studies show an elevated erythrocyte sedimentation rate of 60. Patient now presents to discuss temporal artery biopsy.   Procedure:  The patient was seen in the pre-op holding area. The risks, benefits, complications, treatment options, and expected outcomes were previously discussed with the patient. The patient agreed with the proposed plan and has signed the informed consent form.  The patient was brought to the operating room by the surgical team, identified as Alex [name]. and the procedure verified. A "time out" was completed and the above information confirmed.  Following administration of general anesthesia, the patient is positioned and then prepped and draped in the usual aseptic fashion.  After ascertaining that an adequate level of anesthesia been achieved, a vertical incision is made just anterior to the pinna.  Dissection was carried through subcutaneous tissues and hemostasis achieved with the electrocautery.  Using the Doppler the temporal artery is identified.  It is dissected out.  Branches are looped with 2-0 silk ties.  Artery is confirmed with the Doppler.  2-0 silk ties are tied securely and then the segment of artery is excised sharply with the Metzenbaum scissors.  It is  submitted to pathology for review.  Good hemostasis is achieved throughout the operative field.  Local anesthetic is infiltrated in the tissues surrounding the operative field.  Subcutaneous tissues are reapproximated in layers with interrupted 3-0 Vicryl sutures.  Skin is closed with a running 4-0 Vicryl subcuticular suture.  Wound is washed and dried and Dermabond is applied as dressing.  Patient is awakened from anesthesia and transported to the recovery room.  The patient tolerated the procedure well.   Armandina Gemma, MD [facility] Office: [phone]

## 2020-09-18 ENCOUNTER — Encounter (HOSPITAL_COMMUNITY): Payer: Self-pay | Admitting: Surgery

## 2020-09-18 LAB — SURGICAL PATHOLOGY

## 2020-09-23 ENCOUNTER — Encounter (HOSPITAL_COMMUNITY): Admission: RE | Admit: 2020-09-23 | Payer: Medicare Other | Source: Ambulatory Visit

## 2020-09-24 ENCOUNTER — Encounter: Payer: Self-pay | Admitting: *Deleted

## 2020-09-24 ENCOUNTER — Encounter: Payer: Self-pay | Admitting: Neurology

## 2020-09-24 ENCOUNTER — Other Ambulatory Visit: Payer: Self-pay

## 2020-09-24 ENCOUNTER — Ambulatory Visit (INDEPENDENT_AMBULATORY_CARE_PROVIDER_SITE_OTHER): Payer: Medicare Other | Admitting: Neurology

## 2020-09-24 VITALS — BP 136/84 | HR 64 | Ht 71.0 in | Wt 185.0 lb

## 2020-09-24 DIAGNOSIS — M316 Other giant cell arteritis: Secondary | ICD-10-CM | POA: Diagnosis not present

## 2020-09-24 MED ORDER — PREDNISONE 20 MG PO TABS: ORAL_TABLET | ORAL | 0 refills | Status: DC

## 2020-09-24 NOTE — Progress Notes (Signed)
[facility]    Provider:  Dr Jaynee Eagles Requesting Provider: Ginger Organ., MD Primary Care Provider:  Ginger Organ., MD  CC:  Steroids  HPI:  Alex [name]. is a 80 y.o. male here as requested by Ginger Organ., MD for Headaches.  Past medical history amyloidosis nephrotic syndrome chemotherapy at [facility], coronary artery disease, hyperlipidemia, COVID bilateral pneumonia, hypertension, seizure disorder last in 1978 generalized tonic-clonic type II, GERD, OSA on BiPAP, pulmonary fibrosis and chronic respiratory failure with hypoxia discontinuing his CPAP recently due to his headache, right parotid surgery, former smoker stopped in 1971 started in 1956 but was very heavy 40+ a day, 2-3 alcoholic drinks a week.   I reviewed Dr. Trena Platt office notes: Sed rate was 60 and a trial of steroids was provided.  He reports persistent constant headaches like squeezing over the sides of the head, steady pressure with sudden jolt of pain, MRI was normal, steroids helped the first day or 2 but then returned, muscle relaxants have not helped, history of cluster headaches in the 1990s which were constant, no other headache pattern since then.  Temporary improvement with steroids.  Normal MRI.  They proceeded with general surgery consultation to consider temporal artery biopsy.  Consider MRI of the cervical spine.  He is here with his wife, started having jaw pain, radiated to the head, worse on the right but on both sides, he had a slight fever, wife provides much information, low-grade fevers, fatigued and achy, wanted to sleep a lot he was tired, the vessels were popping out on the head and were large, MRI of the brain was negative, biopsy positive for temporal arteritis,scar is healing well,  He had a headache, he was also having double vision. Started on steroids and improving. His shoulders did not hurt. He is in remission for kidney cancer. Jaw pain is improved,  fatigue, improved almost right away on the steroids. No other focal neurologic deficits, associated symptoms, inciting events or modifiable factors.  Biopsy was positive for temporal arteritis.   Reviewed notes, labs and imaging from outside physicians, which showed: Lab work 07/06/2020 showed a CMP with creatinine 1.3, BUN 20, otherwise unremarkable, CBC unremarkable slightly reduced platelets 132, TSH normal, A1c 5.4.  MRI brain 09/04/2020: IMPRESSION: No acute or subacute finding. No left occipital stroke as was suggested by CT. Reviewed images and agree  Age related volume loss. Mild chronic small-vessel ischemic changes of the cerebral hemispheric white matter. No finding seen to explain headache.  Reviewed biopsy/pathology report: Granulomatous arteritis  Review of Systems: Patient complains of symptoms per HPI as well as the following symptoms Temporal Arteritis. Pertinent negatives and positives per HPI. All others negative.   Social History   Socioeconomic History  . Marital status: Married    Spouse name: Not on file  . Number of children: 2  . Years of education: 48  . Highest education level: Not on file  Occupational History  . Occupation: Retired  Tobacco Use  . Smoking status: Former Smoker    Packs/day: 3.00    Years: 20.00    Pack years: 60.00    Types: Cigarettes    Quit date: 04/25/1969    Years since quitting: 51.4  . Smokeless tobacco: Former Systems developer    Types: [place] date: 11/07/1983  Vaping Use  . Vaping Use: Never used  Substance and Sexual Activity  . Alcohol use: Yes    Comment: RARE  .  Drug use: Never  . Sexual activity: Not Currently    Comment: SHIM 1. Reports erectile dysfunction.;    Other Topics Concern  . Not on file  Social History Narrative   ** Merged History Encounter **   Lives at home w/ his wife   Right-handed   Caffeine: Dr Malachi Bonds 16 oz/week   Social Determinants of Health   Financial Resource Strain: Not on file  Food  Insecurity: Not on file  Transportation Needs: Not on file  Physical Activity: Not on file  Stress: Not on file  Social Connections: Not on file  Intimate Partner Violence: Not on file    Family History  Problem Relation Age of Onset  . Diabetes Father   . Heart disease Father   . Heart attack Father 43  . Hypertension Father   . Hearing loss Mother   . Eczema Sister   . Cancer Sister        1 sister had cancer but patient unsure of type  . Hyperlipidemia Other   . Diabetes Other   . Hypertension Other   . Seizures Other   . Thyroid disease Other   . Colon cancer Neg Hx   . Stroke Neg Hx   . Breast cancer Neg Hx   . Pancreatic cancer Neg Hx   . Prostate cancer Neg Hx     Past Medical History:  Diagnosis Date  . AL amyloidosis [facility]) oncologist-- dr [name]   Renal and Systemic , dx 04/ 2018;  renal bx 08-05-2016 and bone marrow bx 08-29-2016;  8 month chemotherapy then stell cell transplant 12/ 2018 then chemo again;  since then been on oral chemo daily (11-07-2019 per pt has been on hold until after prostate seed implants)  . Arthritis   . Atypical chest pain   . CAD (coronary artery disease) cardiologist-- dr [name];  also followed by cardiologist w/ [facility] in [place], dr [name]   moderate nonobstrucive disease by cath 2011; Lexiscan Myoview 6/14--Normal study, no scar or ischemia, EF 62%;  11-14-2014 nuclear study low risk w/ normal perfusion, nuclear ef 67%  . CKD (chronic kidney disease), stage III ([place])   . COVID    b/l PNA (7/20)- 21 day hospitalization tx with steroids, Remdesevir, Actemra, Hydroxychloroquine) --> PE on Eliquis.  . Diverticulosis of colon   . Dyspnea    WITH EXERTION   . ED (erectile dysfunction)   . GERD (gastroesophageal reflux disease)   . H/O stem cell transplant ([place]) 03/2017  . Headache   . Heart murmur   . Hiatal hernia   . History of 2019 novel coronavirus disease (COVID-19) 09/2018   [facility] hospital  admission 10-28-2019 respiratory failure/ pneumometrostinom/ PE ;  was not intubated , treatment with remdesivir/ steroids/ actemra/ plasma  . History of basal cell carcinoma (BCC)   . History of colon polyps   . History of MRSA infection    culture positive  . History of pulmonary embolus (PE) 10/2018   with covid 19, completed treatment with eliquis  . History of seizures    11-07-2019  per pt last one 1970s, unknown cause  . History of vertebral compression fracture 1988   lumbar  . Hx of adenomatous colonic polyps    negative in 7/08  . Hyperlipidemia   . Hypertension   . IFG (impaired fasting glucose)   . Neuropathy due to chemotherapeutic drug (HCC)    fingers tips and feet  .  Nocturia   . On supplemental oxygen therapy    post covid 19;   11-07-2019 pt uses O2 every night 2L via [place],  and uses supplements during the day when walking around neighborhood and goes out to appointments/ store that he has to walk a distance  . OSA (obstructive sleep apnea)    study in epic 03-18-2005 mild to moderate   (11-07-2019  per pt used cpap up until when he lost alot of weight, stoped approx. 2012  . Prostate cancer [facility]) urologist--- dr pace/  oncology-- dr Tammi Klippel   dx 03/ 2021  . Pulmonary fibrosis ([place]) 09/2018   pulmonologist-- dr Vaughan Browner;   post covid 19  . Seizure ([place])    1st in 1976; last in 1978 GTC x 2   . SOB (shortness of breath) on exertion    11-07-2019  per pt walks around neighborhood for 30 minutes twice daily, he does have to stop frequently d/t sob and since weather has been hot he uses his supplemental oxygen;  stated he has a pulse ox. at home , when he is sitting down O2 sat 96 -97% on RA when up walking around the house O2 sat 93-94% on RA;  pt stated he carry's his O2 w/ him when he goes to doctor appts/store d/t distance  . Wears glasses   . Wears hearing aid in both ears     Patient Active Problem List   Diagnosis Date Noted  . Temporal arteritis ([place]) 09/24/2020   . Sedimentation rate elevation 09/15/2020  . Headache 09/15/2020  . Malignant neoplasm of prostate ([place]) 08/06/2019  . Pneumonia due to COVID-19 virus 10/29/2018  . Acute respiratory failure with hypoxia ([place]) 10/29/2018  . Thrombocytopenia ([place]) 10/29/2018  . Left lower lobe pneumonia 04/22/2018  . Sepsis due to pneumonia ([place]) 04/22/2018  . Engraftment syndrome following stem cell transplantation ([place]) 04/27/2017  . H/O autologous stem cell transplant ([place]) 04/10/2017  . Aortic stenosis 03/08/2017  . Hearing loss 03/08/2017  . OSA (obstructive sleep apnea) 03/08/2017  . Parotid mass 02/23/2017  . Dysfunction of right eustachian tube 02/06/2017  . Presbycusis of both ears 02/06/2017  . Folliculitis 46/50/3546  . AL amyloidosis ([place]) 08/21/2016  . Umbilical hernia 56/81/2751  . DIZZINESS 02/19/2010  . CAD, NATIVE VESSEL 07/21/2009  . CHEST PAIN-UNSPECIFIED 07/21/2009  . HYPERLIPIDEMIA 04/30/2007  . Essential hypertension 04/30/2007  . GASTROESOPHAGEAL REFLUX DISEASE 04/30/2007  . SEIZURE DISORDER 04/30/2007  . COMPRESSION FRACTURE, L1 VERTEBRA 04/30/2007  . DIVERTICULOSIS, COLON, HX OF 04/30/2007  . Other postprocedural status(V45.89) 04/30/2007    Past Surgical History:  Procedure Laterality Date  . ARTERY BIOPSY Right 09/17/2020   Procedure: RIGHT TEMPORAL ARTERY BIOPSY;  Surgeon: Armandina Gemma, MD;  Location: [facility];  Service: General;  Laterality: Right;  . CARDIAC CATHETERIZATION  05-21-1999  and 12-29-2000  _0   dr Maryjo [place] nuclear study;  moderate nonobstructive disease involving LAD and LCx;  second done due to in Reversal research trial  . CARDIAC CATHETERIZATION  07/07/2009  dr [name]   nonobstructive disease w/ normal LVF  . CARPAL TUNNEL RELEASE Left 1994  . CERVICAL FUSION  1988   C2 -- 4  . CYSTOSCOPY N/A 11/13/2019   Procedure: CYSTOSCOPY FLEXIBLE;  Surgeon: [name], MD;  [facility];  Service: Urology;  Laterality:  N/A;  no seeds detected per Dr. Claudia Desanctis  . FOOT SURGERY Bilateral x3 last one 1980s   bunionectomy great toe--- x2 right and  x1 left  . KNEE ARTHROSCOPY Left 1996  . NASAL SEPTUM SURGERY  x3 last one 08-09-2010 @ [place]  . PAROTIDECTOMY Right 03/09/2017  . RADIOACTIVE SEED IMPLANT N/A 11/13/2019   Procedure: RADIOACTIVE SEED IMPLANT/BRACHYTHERAPY IMPLANT;  Surgeon: [name], MD;  Location: [facility];  Service: Urology;  Laterality: N/A;  ONLY NEEDS 90 MIN FOR ALL PROCEDURES  . ROTATOR CUFF REPAIR Bilateral x1 rigth (unsure date);  x2 left last one 09-07-2009 _0 ;  right  . SPACE OAR INSTILLATION N/A 11/13/2019   Procedure: SPACE OAR INSTILLATION;  Surgeon: [name], MD;  Location: [facility];  Service: Urology;  Laterality: N/A;  . TRIGGER FINGER RELEASE Bilateral x2  last one 2001 approx.   x1 finger left hand;  x2 fingers right hand  . UMBILICAL HERNIA REPAIR  01/19/2011  . VASECTOMY  approx. 1968 (with anesthesia)    Current Outpatient Medications  Medication Sig Dispense Refill  . acyclovir (ZOVIRAX) 200 MG capsule Take 200 mg by mouth 2 (two) times daily.    . Ascorbic Acid (VITAMIN C) 1000 MG tablet Take 1,000 mg by mouth 2 (two) times daily.    [name] Kitchen aspirin EC 81 MG tablet Take 81 mg by mouth daily.    [name] Kitchen atorvastatin (LIPITOR) 40 MG tablet Take 40 mg by mouth at bedtime.     [name] Kitchen b complex vitamins capsule Take 1 capsule by mouth daily.    . carvedilol (COREG) 6.25 MG tablet Take 6.25 mg by mouth every morning.    . Cholecalciferol 25 MCG (1000 UT) tablet Take 1,000 Units by mouth 2 (two) times a day.     . fluticasone (FLONASE) 50 MCG/ACT nasal spray Place 2 sprays into both nostrils daily. 16 g 5  . folic acid (FOLVITE) 1 MG tablet Take 1 mg by mouth at bedtime.     . furosemide (LASIX) 40 MG tablet Take 40 mg by mouth daily.     . Ipratropium-Albuterol (COMBIVENT RESPIMAT) 20-100 MCG/ACT AERS respimat Inhale 1 puff into the lungs every 6  (six) hours as needed for wheezing or shortness of breath.    . isosorbide mononitrate (IMDUR) 15 mg TB24 24 hr tablet Take 15 mg by mouth daily.    . Multiple Vitamin (MULTI-VITAMINS) TABS Take 1 tablet by mouth at bedtime.    . nitroGLYCERIN (NITROSTAT) 0.4 MG SL tablet Place 1 tablet (0.4 mg total) under the tongue every 5 (five) minutes as needed for chest pain. 25 tablet 3  . pantoprazole (PROTONIX) 40 MG tablet Take 40 mg by mouth daily.     . potassium chloride SA (KLOR-CON) 20 MEQ tablet Take 20 mEq by mouth daily.    . predniSONE (DELTASONE) 20 MG tablet 88m(3 tabs) daily for 2 weeks then reduce to 50 mg(2.5 tabs) daily for 2 weeks then 40 mg(2 tabs) daily for 4 weeks and hold at this dose until you come back to the office. Take in the morning with food. 200 tablet 0  . tamsulosin (FLOMAX) 0.4 MG CAPS capsule Take 0.4 mg by mouth 2 (two) times daily.    . vitamin B-12 (CYANOCOBALAMIN) 500 MCG tablet Take 500 mcg by mouth daily.     No current facility-administered medications for this visit.    Allergies as of 09/24/2020 - Review Complete 09/24/2020  Allergen Reaction Noted  . Ramipril Cough 07/06/2017    Vitals: BP 136/84 (BP Location: Right Arm, Patient Position: Sitting)   Pulse 64   Ht 5'  11" (1.803 m)   Wt 185 lb (83.9 kg)   BMI 25.80 kg/m  Last Weight:  Wt Readings from Last 1 Encounters:  09/24/20 185 lb (83.9 kg)   Last Height:   Ht Readings from Last 1 Encounters:  09/24/20 _0  (1.803 m)     Physical exam: Exam: Gen: NAD, conversant, well nourised, well groomed                     CV: breathing rate normal,  No peripheral edema, warm, nontender Eyes: Conjunctivae clear without exudates or hemorrhage  Neuro: Detailed Neurologic Exam  Speech:    Speech is normal; fluent and spontaneous with normal comprehension.  Cognition:    The patient is oriented to person, place, and time;     recent and remote memory intact;     language fluent;     normal  attention, concentration,     fund of knowledge Cranial Nerves:    The pupils are equal, round, and reactive to light. Pupils too small to visualize fundi.  Visual fields are full to finger confrontation. Extraocular movements are intact. Trigeminal sensation is intact and the muscles of mastication are normal. The face is symmetric. The palate elevates in the midline. Hearing intact. Voice is normal. Shoulder shrug is normal. The tongue has normal motion without fasciculations.   Coordination:    Normal finger to nose and heel to shin. Normal rapid alternating movements.   Gait:    Heel-toe and tandem gait are normal.   Motor Observation:    No asymmetry, no atrophy, and no involuntary movements noted. Tone:    Normal muscle tone.    Posture:    Posture is normal. normal erect    Strength:    Strength is symmetrical in the upper and lower limbs.      Sensation: intact to LT     Reflex Exam:  DTR's:    Deep tendon reflexes in the upper and lower extremities are symmetrical bilaterally.   Toes:    The toes are downgoing bilaterally.   Clonus:    Clonus is absent.    Assessment/Plan: This is a patient with temporal arteritis.  On prednisone 60 mg.  We will slowly decrease the prednisone and monitor his ESR and CRP.  - 46m for 2 weeks then reduced to 50 mg/day for 2 weeks then 40 mg/day for 4 weeks. Return to clinic in 6-8 weeks for check on symptoms, check on esr/crp and for further titration as below. -Check ESR and CRP monthly unless symptoms worsen then check again and adjust taper. -Subsequently gradually reduce the dose by 5 mg every 2 weeks to 20 mg -When patient is at 20 mg reduce the dose by 2.5 mg every 2 weeks to 10 mg -And patient is at 10 mg can reduce the dose by 1 mg each month -Monitor patient may be on steroids for 6 to 12 months depending on his ESR CRP and symptoms. - need to follow up with Dr. SBrigitte Pulseto manage glucose/diabetes - Continue protonix, take  medications in the morning with food, watch for stomach upset/dark stools.   Orders Placed This Encounter  Procedures  . Sedimentation rate  . C-reactive protein  . Comprehensive metabolic panel  . CBC with Differential/Platelets  . Hemoglobin A1c   Meds ordered this encounter  Medications  . predniSONE (DELTASONE) 20 MG tablet    Sig: 643m3 tabs) daily for 2 weeks then reduce to 50 mg(2.5 tabs)  daily for 2 weeks then 40 mg(2 tabs) daily for 4 weeks and hold at this dose until you come back to the office. Take in the morning with food.    Dispense:  200 tablet    Refill:  0    Cc: Ginger Organ., MD,  Ginger Organ., MD  Sarina Ill, MD  [facility] [address] [place], [place] [address]  Phone [phone] Fax [phone]

## 2020-09-24 NOTE — Patient Instructions (Signed)
- 67m for 2 weeks then reduced to 50 mg/day for 2 weeks then 40 mg/day for 4 weeks. Return to clinic in 6-8 weeks for check on symptoms, check on esr/crp and for further titration as below. - After this we will slow down titration.  - Return to clinic in 8 weeks for further instructions and to check bloodwork and symptoms    Temporal Arteritis  Temporal arteritis is a condition that causes arteries to become inflamed. It usually affects arteries in your head and face, but arteries in any part of the body can become inflamed. The condition is also called giant cell arteritis.  Temporal arteritis can cause serious problems such as blindness. Early treatment can help prevent these problems. What are the causes? The cause of this condition is not known. What increases the risk? The following factors may make you more likely to develop this condition:  Being older than 50.  Being Caucasian.  Being of [place].  Having a family history of the condition.  Having a certain condition that causes muscle pain and stiffness (polymyalgia rheumatica, PMR). What are the signs or symptoms? Some people with temporal arteritis have just one symptom, while others have several symptoms. Most symptoms are related to the head and face. These may include:  Headache.  Hard or swollen temples. This is common. Your temples are the areas on either side of your forehead. If your temples are swollen, it may hurt to touch them.  Pain when combing your hair or when laying your head down.  Pain in the jaw when chewing.  Pain in the throat or tongue.  Problems with your vision, such as sudden loss of vision in one eye, or seeing double. Other symptoms may include:  Fever.  Tiredness (fatigue).  A dry cough.  Pain in the hips and shoulders.  Pain in the arms during exercise.  Depression.  Weight loss. How is this diagnosed? This condition may be  diagnosed based on:  Your symptoms.  Your medical history.  A physical exam.  Tests, including: ? Blood tests. ? A test in which a tissue sample is removed from an artery so it can be examined (biopsy). ? Imaging tests, such as an ultrasound or MRI. How is this treated? This condition may be treated with:  A type of medicine to reduce inflammation (corticosteroid).  Medicines to weaken your immune system (immunosuppressants).  Other medicines to treat vision problems. You will need to see your health care provider while you are being treated. The medicines used to treat this condition can increase your risk of problems such as bone loss and diabetes. During follow-up visits, your health care provider will check for problems by:  Doing blood tests and bone density tests.  Checking your blood pressure and blood sugar. Follow these instructions at home: Medicines  Take over-the-counter and prescription medicines only as told by your health care provider.  Take any vitamins or supplements recommended by your health care provider. These may include vitamin D and calcium, which help keep your bones from becoming weak. Eating and drinking  Eat a heart-healthy diet. This may include: ? Eating high-fiber foods, such as fresh fruits and vegetables, whole grains, and beans. ? Eating heart-healthy fats (omega-3 fats), such as fish, flaxseed, and flaxseed oil. ? Limiting foods that are high in saturated fat and cholesterol, such as processed and fried foods, fatty meat, and full-fat dairy. ? Limiting how much salt (sodium) you eat.  Include calcium and vitamin D in your diet. Good sources of calcium and vitamin D include: ? Low-fat dairy products such as milk, yogurt, and cheese. ? Certain fish, such as fresh or canned salmon, tuna, and sardines. ? Products that have calcium and vitamin D added to them (fortified products), such as fortified cereals or juice.   General  instructions  Exercise. Talk with your health care provider about what exercises are okay for you. Exercises that increase your heart rate (aerobic exercise), such as walking, are often recommended. Aerobic exercise helps control your blood pressure and prevent bone loss.  Stay up to date on all vaccines as directed by your health care provider.  Keep all follow-up visits as told by your health care provider. This is important. Contact a health care provider if:  Your symptoms get worse.  You develop signs of infection, such as fever, swelling, redness, warmth, and tenderness. Get help right away if:  You lose your vision.  Your pain does not go away, even after you take medicine.  You have chest pain.  You have trouble breathing.  One side of your face or body suddenly becomes weak or numb. These symptoms may represent a serious problem that is an emergency. Do not wait to see if the symptoms will go away. Get medical help right away. Call your local emergency services (911 in the [place] Do not drive yourself to the hospital. Summary  Temporal arteritis is a condition that causes arteries to become inflamed. It usually affects arteries in your head and face.  This condition can cause serious problems, such as blindness. Treatment can help prevent these problems.  Symptoms may include hard or tender temples, pain in your jaw when chewing, problems with your vision, or pain in your hips and shoulders.  Take over-the-counter and prescription medicines as told by your health care provider. This information is not intended to replace advice given to you by your health care provider. Make sure you discuss any questions you have with your health care provider. Document Revised: 05/25/2017 Document Reviewed: 05/23/2017 Elsevier Patient Education  2021 [employer].

## 2020-09-25 LAB — COMPREHENSIVE METABOLIC PANEL
ALT: 88 IU/L — ABNORMAL HIGH (ref 0–44)
AST: 53 IU/L — ABNORMAL HIGH (ref 0–40)
Albumin/Globulin Ratio: 1.2 (ref 1.2–2.2)
Albumin: 4 g/dL (ref 3.7–4.7)
Alkaline Phosphatase: 212 IU/L — ABNORMAL HIGH (ref 44–121)
BUN/Creatinine Ratio: 28 — ABNORMAL HIGH (ref 10–24)
BUN: 36 mg/dL — ABNORMAL HIGH (ref 8–27)
Bilirubin Total: 0.5 mg/dL (ref 0.0–1.2)
CO2: 24 mmol/L (ref 20–29)
Calcium: 9.5 mg/dL (ref 8.6–10.2)
Chloride: 101 mmol/L (ref 96–106)
Creatinine, Ser: 1.29 mg/dL — ABNORMAL HIGH (ref 0.76–1.27)
Globulin, Total: 3.3 g/dL (ref 1.5–4.5)
Glucose: 113 mg/dL — ABNORMAL HIGH (ref 65–99)
Potassium: 4.3 mmol/L (ref 3.5–5.2)
Sodium: 142 mmol/L (ref 134–144)
Total Protein: 7.3 g/dL (ref 6.0–8.5)
eGFR: 57 mL/min/{1.73_m2} — ABNORMAL LOW (ref >59)

## 2020-09-25 LAB — CBC WITH DIFFERENTIAL/PLATELET
Basophils Absolute: 0 10*3/uL (ref 0.0–0.2)
Basos: 0 %
EOS (ABSOLUTE): 0 10*3/uL (ref 0.0–0.4)
Eos: 0 %
Hematocrit: 44 % (ref 37.5–51.0)
Hemoglobin: 14.9 g/dL (ref 13.0–17.7)
Immature Grans (Abs): 0.1 10*3/uL (ref 0.0–0.1)
Immature Granulocytes: 1 %
Lymphocytes Absolute: 1.5 10*3/uL (ref 0.7–3.1)
Lymphs: 11 %
MCH: 31.8 pg (ref 26.6–33.0)
MCHC: 33.9 g/dL (ref 31.5–35.7)
MCV: 94 fL (ref 79–97)
Monocytes Absolute: 0.5 10*3/uL (ref 0.1–0.9)
Monocytes: 4 %
Neutrophils Absolute: 11.7 10*3/uL — ABNORMAL HIGH (ref 1.4–7.0)
Neutrophils: 84 %
Platelets: 338 10*3/uL (ref 150–450)
RBC: 4.69 x10E6/uL (ref 4.14–5.80)
RDW: 12.1 % (ref 11.6–15.4)
WBC: 13.8 10*3/uL — ABNORMAL HIGH (ref 3.4–10.8)

## 2020-09-25 LAB — C-REACTIVE PROTEIN: CRP: 25 mg/L — ABNORMAL HIGH (ref 0–10)

## 2020-09-25 LAB — SEDIMENTATION RATE: Sed Rate: 21 mm/hr (ref 0–30)

## 2020-09-25 LAB — HEMOGLOBIN A1C
Est. average glucose Bld gHb Est-mCnc: 126 mg/dL
Hgb A1c MFr Bld: 6 % — ABNORMAL HIGH (ref 4.8–5.6)

## 2020-09-28 ENCOUNTER — Telehealth: Payer: Self-pay | Admitting: *Deleted

## 2020-09-28 NOTE — Telephone Encounter (Signed)
-----  Message from Melvenia Beam, MD sent at 09/25/2020 11:36 AM EDT ----- Alex [name]: His ESR has significantly come back down, we will continue to watch that. He is pre-diabetic so he will have to work with Dr. [name] to manage his glucose while he is on the steroids for temporal arteritis. Not sure why his liver enzymes and creatinine are elevated as compared to several days ago but Dr. [name] and I should monitor that (I am ccing Dr. [name] on this). Patient has an appointment with me on 7/12 for follow up  and we can recheck then but he should also have a followup with Dr. [name] hopefully before then; have him call Dr. Raul Del office please for that. thanks

## 2020-09-28 NOTE — Telephone Encounter (Signed)
Patient called office.  We discussed lab results including the instruction for patient to see Dr. [name] preferably before the patient sees Dr. Jaynee Eagles again on July 12.  Labs should be followed up on which we will assist with.  The patient verbalized understanding and appreciation.  He stated he would call Dr. Raul Del office right away to schedule an appointment and he understands the results were also sent to Dr. [name].

## 2020-10-01 ENCOUNTER — Other Ambulatory Visit: Payer: Self-pay | Admitting: *Deleted

## 2020-10-01 ENCOUNTER — Encounter: Payer: Self-pay | Admitting: Physician Assistant

## 2020-10-01 ENCOUNTER — Ambulatory Visit: Payer: Medicare Other | Admitting: Physician Assistant

## 2020-10-01 VITALS — BP 100/70 | HR 80 | Ht 68.0 in | Wt 182.1 lb

## 2020-10-01 DIAGNOSIS — R195 Other fecal abnormalities: Secondary | ICD-10-CM

## 2020-10-01 DIAGNOSIS — R634 Abnormal weight loss: Secondary | ICD-10-CM | POA: Diagnosis not present

## 2020-10-01 NOTE — Telephone Encounter (Signed)
Records received for recent stress test at [facility].  Being scanned to chart for Dr. [name] to review as requested at last ov.

## 2020-10-01 NOTE — Patient Instructions (Signed)
Make follow up appointment with pulmonologist to get clearance for any upcoming procedure.   If you are age 78 or older, your body mass index should be between 23-30. Your Body mass index is 27.69 kg/m. If this is out of the aforementioned range listed, please consider follow up with your Primary Care Provider.  If you are age 97 or younger, your body mass index should be between 19-25. Your Body mass index is 27.69 kg/m. If this is out of the aformentioned range listed, please consider follow up with your Primary Care Provider.   __________________________________________________________  The [facility] providers would like to encourage you to use [facility] to communicate with providers for non-urgent requests or questions.  Due to long hold times on the telephone, sending your provider a message by [facility] may be a faster and more efficient way to get a response.  Please allow 48 business hours for a response.  Please remember that this is for non-urgent requests.

## 2020-10-01 NOTE — Progress Notes (Signed)
Reviewed.  Complicated case.  Still seems quite high risk.  Now older.  Recommend the following:  1.  Daily PPI (if not on one already) for upper GI mucosal protection  2.  Upper GI series to rule out any significant abnormalities of the upper GI tract (ulcers, tumors, etc.)  3.  Virtual colonoscopy.  This to rule out any significant colonic abnormalities, namely tumor.  Please coordinate.  Thanks,  J.  P.

## 2020-10-01 NOTE — Progress Notes (Signed)
Chief Complaint: Heme positive stools  HPI:    Alex [name] on is a 80 year old male with a past medical history as listed below including CAD (06/05/2020 echo with LVEF 60-65% and moderate aortic valve stenosis), CKD stage III and multiple others, known to Dr. Henrene Pastor, who was referred to me by Ginger Organ., MD for a complaint of heme positive stools.    08/16/2016 Alex [name] saw Dr. Henrene Pastor for heme positive stool as well as some weight loss.  At that time discussed he had last been seen in February 2015 for surveillance colonoscopy.  He was found to have diminutive tubular adenomas and diverticulosis with follow-up in 5 years recommended.  At that time recommended an EGD and colonoscopy.  It does not look as though these were ever done.  (Discussed with wife and Alex [name] it sounds like PCP but is apprehensive to proceed given all of his other health conditions).    07/16/2020 heme assure positive.  07/28/2020 Hemoccult positive x3.    09/24/2020 CBC with a normal hemoglobin at 14.9.  CMP with a BUN of 28 and creatinine of 1.29.  Also elevated liver enzymes with an alk phos of 212, AST 53 and ALT 88.  Plans were to monitor in the coming weeks.  CRP elevated at 25.  Noted that he was on steroids for temporal arteritis.    Today, the Alex [name] presents to clinic accompanied by his wife and explains that most recently he has been diagnosed with temporal arteritis and now is on Prednisone for 2 years.  Tells me that around the time this was started a month or so ago he has noticed a worsening of his breathing finding it harder and harder to take a deep breath.  He has not followed with anyone in regards to this yet.  Explains that he was found to have blood in his stool 3 times in a row by his PCP.  He also tells me that he has lost about 25 pounds over the past 6 months or so without really trying.  Also describes some hoarseness.    Denies fever, chills, seeing blood in his stool, black stools, abdominal pain,  heartburn, reflux or symptoms that awaken him from sleep.  Past Medical History:  Diagnosis Date   [place] amyloidosis [facility]) oncologist-- dr [name]   Renal and Systemic , dx 04/ 2018;  renal bx 08-05-2016 and bone marrow bx 08-29-2016;  8 month chemotherapy then stell cell transplant 12/ 2018 then chemo again;  since then been on oral chemo daily (11-07-2019 per pt has been on hold until after prostate seed implants)   Arthritis    Atypical chest pain    CAD (coronary artery disease) cardiologist-- dr [name];  also followed by cardiologist w/ [facility] in [place], dr [name]   moderate nonobstrucive disease by cath 2011; Lexiscan Myoview 6/14--Normal study, no scar or ischemia, EF 62%;  11-14-2014 nuclear study low risk w/ normal perfusion, nuclear ef 67%   CKD (chronic kidney disease), stage III (HCC)    COVID    b/l PNA (7/20)- 21 day hospitalization tx with steroids, Remdesevir, Actemra, Hydroxychloroquine) --> PE on Eliquis.   Diverticulosis of colon    Dyspnea    WITH EXERTION    ED (erectile dysfunction)    GERD (gastroesophageal reflux disease)    H/O stem cell transplant ([place]) 03/2017   Headache    Heart murmur    Hiatal hernia    History of 2019 novel coronavirus disease (  COVID-19) 09/2018   [facility], hospital admission 10-28-2019 respiratory failure/ pneumometrostinom/ PE ;  was not intubated , treatment with remdesivir/ steroids/ actemra/ plasma   History of basal cell carcinoma (BCC)    History of colon polyps    History of MRSA infection    culture positive   History of pulmonary embolus (PE) 10/2018   with covid 19, completed treatment with eliquis   History of seizures    11-07-2019  per pt last one 1970s, unknown cause   History of vertebral compression fracture 1988   lumbar   Hx of adenomatous colonic polyps    negative in 7/08   Hyperlipidemia    Hypertension    IFG (impaired fasting glucose)    Neuropathy due to chemotherapeutic drug  ([place])    fingers tips and feet   Nocturia    On supplemental oxygen therapy    post covid 19;   11-07-2019 pt uses O2 every night 2L via [place],  and uses supplements during the day when walking around neighborhood and goes out to appointments/ store that he has to walk a distance   OSA (obstructive sleep apnea)    study in epic 03-18-2005 mild to moderate   (11-07-2019  per pt used cpap up until when he lost alot of weight, stoped approx. 2012   Prostate cancer [facility]) urologist--- dr pace/  oncology-- dr Tammi Klippel   dx 03/ 2021   Pulmonary fibrosis ([place]) 09/2018   pulmonologist-- dr Vaughan Browner;   post covid 19   Seizure ([place])    1st in 1976; last in 1978 GTC x 2    SOB (shortness of breath) on exertion    11-07-2019  per pt walks around neighborhood for 30 minutes twice daily, he does have to stop frequently d/t sob and since weather has been hot he uses his supplemental oxygen;  stated he has a pulse ox. at home , when he is sitting down O2 sat 96 -97% on RA when up walking around the house O2 sat 93-94% on RA;  pt stated he carry's his O2 w/ him when he goes to doctor appts/store d/t distance   Wears glasses    Wears hearing aid in both ears     Past Surgical History:  Procedure Laterality Date   ARTERY BIOPSY Right 09/17/2020   Procedure: RIGHT TEMPORAL ARTERY BIOPSY;  Surgeon: Armandina Gemma, MD;  Location: [facility];  Service: General;  Laterality: Right;   CARDIAC CATHETERIZATION  05-21-1999  and 12-29-2000  '@[facility]'   dr Maryjo [place] nuclear study;  moderate nonobstructive disease involving LAD and LCx;  second done due to in Reversal research trial   CARDIAC CATHETERIZATION  07/07/2009  dr [name]   nonobstructive disease w/ normal LVF   [place]   C2 -- 4   CYSTOSCOPY N/A 11/13/2019   Procedure: CYSTOSCOPY FLEXIBLE;  Surgeon: [name], MD;  Location: [facility];  Service: Urology;  Laterality: N/A;  no seeds detected per  Dr. Claudia Desanctis   FOOT SURGERY Bilateral x3 last one 1980s   bunionectomy great toe--- x2 right and x1 left   KNEE ARTHROSCOPY Left 1996   NASAL SEPTUM SURGERY  x3 last one 08-09-2010 @ [place]   PAROTIDECTOMY Right 03/09/2017   RADIOACTIVE SEED IMPLANT N/A 11/13/2019   Procedure: RADIOACTIVE SEED IMPLANT/BRACHYTHERAPY IMPLANT;  Surgeon: [name], MD;  Location: [facility];  Service: Urology;  Laterality: N/A;  ONLY NEEDS 90 MIN FOR ALL PROCEDURES   ROTATOR CUFF REPAIR Bilateral x1 rigth (unsure date);  x2 left last one 09-07-2009 '@[facility]' ;  right   SPACE OAR INSTILLATION N/A 11/13/2019   Procedure: SPACE OAR INSTILLATION;  Surgeon: [name], MD;  Location[facility];  Service: Urology;  Laterality: N/A;   TRIGGER FINGER RELEASE Bilateral x2  last one 2001 approx.   x1 finger left hand;  x2 fingers right hand   UMBILICAL HERNIA REPAIR  01/19/2011   VASECTOMY  approx. 1968 (with anesthesia)    Current Outpatient Medications  Medication Sig Dispense Refill   acyclovir (ZOVIRAX) 200 MG capsule Take 200 mg by mouth 2 (two) times daily.     Ascorbic Acid (VITAMIN C) 1000 MG tablet Take 1,000 mg by mouth 2 (two) times daily.     aspirin EC 81 MG tablet Take 81 mg by mouth daily.     atorvastatin (LIPITOR) 40 MG tablet Take 40 mg by mouth at bedtime.      b complex vitamins capsule Take 1 capsule by mouth daily.     carvedilol (COREG) 6.25 MG tablet Take 6.25 mg by mouth every morning.     Cholecalciferol 25 MCG (1000 UT) tablet Take 1,000 Units by mouth 2 (two) times a day.      ezetimibe (ZETIA) 10 MG tablet Take 1 tablet by mouth daily.     fluticasone (FLONASE) 50 MCG/ACT nasal spray Place 2 sprays into both nostrils daily. 16 g 5   folic acid (FOLVITE) 1 MG tablet Take 1 mg by mouth at bedtime.      furosemide (LASIX) 40 MG tablet Take 40 mg by mouth daily.      Ipratropium-Albuterol (COMBIVENT RESPIMAT) 20-100 MCG/ACT AERS respimat Inhale 1 puff into the  lungs every 6 (six) hours as needed for wheezing or shortness of breath.     isosorbide mononitrate (IMDUR) 15 mg TB24 24 hr tablet Take 15 mg by mouth daily.     Multiple Vitamin (MULTI-VITAMINS) TABS Take 1 tablet by mouth at bedtime.     mupirocin ointment (BACTROBAN) 2 % Apply to skin lesion TID     nitroGLYCERIN (NITROSTAT) 0.4 MG SL tablet Place 1 tablet (0.4 mg total) under the tongue every 5 (five) minutes as needed for chest pain. 25 tablet 3   pantoprazole (PROTONIX) 40 MG tablet Take 40 mg by mouth daily.      potassium chloride SA (KLOR-CON) 20 MEQ tablet Take 20 mEq by mouth daily.     predniSONE (DELTASONE) 20 MG tablet 74m(3 tabs) daily for 2 weeks then reduce to 50 mg(2.5 tabs) daily for 2 weeks then 40 mg(2 tabs) daily for 4 weeks and hold at this dose until you come back to the office. Take in the morning with food. 200 tablet 0   tamsulosin (FLOMAX) 0.4 MG CAPS capsule Take 0.4 mg by mouth 2 (two) times daily.     vitamin B-12 (CYANOCOBALAMIN) 500 MCG tablet Take 500 mcg by mouth daily.     No current facility-administered medications for this visit.    Allergies as of 10/01/2020 - Review Complete 09/24/2020  Allergen Reaction Noted   Ramipril Cough 07/06/2017    Family History  Problem Relation Age of Onset   Diabetes Father    Heart disease Father    Heart attack Father 718  Hypertension Father    Hearing loss Mother    Eczema Sister  Cancer Sister        1 sister had cancer but Alex [name] unsure of type   Hyperlipidemia Other    Diabetes Other    Hypertension Other    Seizures Other    Thyroid disease Other    Colon cancer Neg Hx    Stroke Neg Hx    Breast cancer Neg Hx    Pancreatic cancer Neg Hx    Prostate cancer Neg Hx     Social History   Socioeconomic History   Marital status: Married    Spouse name: Not on file   Number of children: 2   Years of education: 12   Highest education level: Not on file  Occupational History   Occupation:  Retired  Tobacco Use   Smoking status: Former    Packs/day: 3.00    Years: 20.00    Pack years: 60.00    Types: Cigarettes    Quit date: 04/25/1969    Years since quitting: 51.4   Smokeless tobacco: Former    Types: Chew    Quit date: 11/07/1983  Vaping Use   Vaping Use: Never used  Substance and Sexual Activity   Alcohol use: Yes    Comment: RARE   Drug use: Never   Sexual activity: Not Currently    Comment: SHIM 1. Reports erectile dysfunction.;    Other Topics Concern   Not on file  Social History Narrative   ** Merged History Encounter **   Lives at home w/ his wife   Right-handed   Caffeine: Dr Malachi Bonds 16 oz/week   Social Determinants of Health   Financial Resource Strain: Not on file  Food Insecurity: Not on file  Transportation Needs: Not on file  Physical Activity: Not on file  Stress: Not on file  Social Connections: Not on file  Intimate Partner Violence: Not on file    Review of Systems:    Constitutional: No weight loss, fever or chills Skin: No rash  Cardiovascular: No chest pain Respiratory: +SOB Gastrointestinal: See HPI and otherwise negative Genitourinary: No dysuria  Neurological: No headache Musculoskeletal: No new muscle or joint pain Hematologic: No bleeding  Psychiatric: No history of depression or anxiety   Physical Exam:  Vital signs: Ht '5\' 8"'  (1.727 m) Comment: height measured without shoes  Wt 182 lb 2 oz (82.6 kg)   BMI 27.69 kg/m   Constitutional:   Pleasant Caucasian male appears to be in NAD, Well developed, Well nourished, alert and cooperative Head:  Normocephalic and atraumatic. Eyes:   PEERL, EOMI. No icterus. Conjunctiva pink. Ears:  Normal auditory acuity. Neck:  Supple Throat: Oral cavity and pharynx without inflammation, swelling or lesion.  Respiratory: Respirations even and unlabored. Lungs clear to auscultation bilaterally.   No wheezes, crackles, or rhonchi. +visibly increased work of breathing Cardiovascular:  Normal S1, S2. No MRG. Regular rate and rhythm. No peripheral edema, cyanosis or pallor.  Gastrointestinal:  Soft, nondistended, nontender. No rebound or guarding. Normal bowel sounds. No appreciable masses or hepatomegaly. Rectal:  Not performed.  Msk:  Symmetrical without gross deformities. Without edema, no deformity or joint abnormality.  Neurologic:  Alert and  oriented x4;  grossly normal neurologically.  Skin:   Dry and intact without significant lesions or rashes. Psychiatric: Demonstrates good judgement and reason without abnormal affect or behaviors.  RELEVANT LABS AND IMAGING: CBC    Component Value Date/Time   WBC 13.8 (H) 09/24/2020 0936   WBC 11.4 (H) 09/16/2020 1128   RBC 4.69  09/24/2020 0936   RBC 4.72 09/16/2020 1128   HGB 14.9 09/24/2020 0936   HGB 11.8 (L) 01/30/2017 1329   HCT 44.0 09/24/2020 0936   HCT 34.5 (L) 01/30/2017 1329   PLT 338 09/24/2020 0936   MCV 94 09/24/2020 0936   MCV 98.9 (H) 01/30/2017 1329   MCH 31.8 09/24/2020 0936   MCH 31.8 09/16/2020 1128   MCHC 33.9 09/24/2020 0936   MCHC 33.6 09/16/2020 1128   RDW 12.1 09/24/2020 0936   RDW 13.9 01/30/2017 1329   LYMPHSABS 1.5 09/24/2020 0936   LYMPHSABS 1.0 01/30/2017 1329   MONOABS 0.7 08/24/2020 1520   MONOABS 0.8 01/30/2017 1329   EOSABS 0.0 09/24/2020 0936   BASOSABS 0.0 09/24/2020 0936   BASOSABS 0.0 01/30/2017 1329    CMP     Component Value Date/Time   NA 142 09/24/2020 0936   NA 142 01/30/2017 1331   K 4.3 09/24/2020 0936   K 3.7 01/30/2017 1331   CL 101 09/24/2020 0936   CO2 24 09/24/2020 0936   CO2 26 01/30/2017 1331   GLUCOSE 113 (H) 09/24/2020 0936   GLUCOSE 119 (H) 09/16/2020 1128   GLUCOSE 111 01/30/2017 1331   BUN 36 (H) 09/24/2020 0936   BUN 20.0 01/30/2017 1331   CREATININE 1.29 (H) 09/24/2020 0936   CREATININE 1.14 08/24/2020 1520   CREATININE 1.2 01/30/2017 1331   CALCIUM 9.5 09/24/2020 0936   CALCIUM 9.1 01/30/2017 1331   PROT 7.3 09/24/2020 0936   PROT 5.6  (L) 01/30/2017 1331   ALBUMIN 4.0 09/24/2020 0936   ALBUMIN 2.9 (L) 01/30/2017 1331   AST 53 (H) 09/24/2020 0936   AST 39 08/24/2020 1520   AST 25 01/30/2017 1331   ALT 88 (H) 09/24/2020 0936   ALT 47 (H) 08/24/2020 1520   ALT 17 01/30/2017 1331   ALKPHOS 212 (H) 09/24/2020 0936   ALKPHOS 61 01/30/2017 1331   BILITOT 0.5 09/24/2020 0936   BILITOT 1.0 08/24/2020 1520   BILITOT 0.68 01/30/2017 1331   GFRNONAA >60 09/16/2020 1128   GFRNONAA >60 08/24/2020 1520   GFRAA 58 (L) 12/04/2019 1126    Assessment: 1.  Hemoccult positive stool: X3 by his PCP, not seeing any blood, no anemia 2.  Weight loss: About 20 pounds over the past 6 months without really trying; consider relation to chronic conditions currently versus other  Plan: 1.  Discussed with Alex [name] that Dr. Henrene Pastor felt he was well enough at time of last visit in 2018 to proceed with EGD and colonoscopy and it does not look like the Alex [name] has had a large of major changes in his health since then other than having COVID and this question of increased work of breathing recently.  Will discuss further with Dr. Henrene Pastor and let him review Alex [name]'s chart to decide if he thinks he is a good candidate for EGD and colonoscopy given both heme positive stool and weight loss which has been unexplained.  Alex [name] is willing to proceed if Dr. [name] agrees. 2.  Also discussed that we could just observe, Dr. [name] could take regular labs to monitor for anemia and he could let us know if he starts seeing any signs of GI bleed and or continues to dramatically lose weight. 3.  Also would recommend Alex [name] make an appointment with his pulmonologist given increased work of breathing over the past few weeks.  Also went ahead and sent clearance for possible EGD/colonoscopy to the pulmonary office to make sure they would be okay  with this as well if needed. 4.  Also discussed with Alex [name] and his wife that we could proceed with CT abdomen pelvis to at least rule  out mass/other which could be causing symptoms. 5.  Alex [name] to await further recommendations from Dr. Henrene Pastor.  Ellouise Newer, [name] [facility] 10/01/2020, 10:07 AM  Cc: Ginger Organ., MD

## 2020-10-02 ENCOUNTER — Telehealth: Payer: Self-pay

## 2020-10-02 DIAGNOSIS — R634 Abnormal weight loss: Secondary | ICD-10-CM

## 2020-10-02 DIAGNOSIS — R195 Other fecal abnormalities: Secondary | ICD-10-CM

## 2020-10-02 NOTE — Telephone Encounter (Signed)
-----   Message from Levin Erp, [place] sent at 10/01/2020  3:33 PM EDT ----- Regarding: See dr. Pearletha Furl recs. thanks. Reviewed.  Complicated case.  Still seems quite high risk. Now older. Recommend the following:  1. Daily PPI (if not on one already) for upper GI mucosal protection  2. Upper GI series to rule out any significant abnormalities of the upper GI tract (ulcers, tumors, etc.)  3. Virtual colonoscopy. This to rule out any significant colonic abnormalities, namely tumor.  Please coordinate.  Thanks,  J. P.    Please let patient know dr Henrene Pastor reviewed and recommends the above. Thanks-JLL ----- Message ----- From: Irene Shipper, MD Sent: 10/01/2020  12:07 PM EDT To: Levin Erp, PA     ----- Message ----- From: Levin Erp, [place] Sent: 10/01/2020  10:53 AM EDT To: Irene Shipper, MD

## 2020-10-02 NOTE — Telephone Encounter (Signed)
Upper GI series order in epic. Message sent to radiology scheduler to schedule appt (April Pait, Rhys Martini).   Spoke with Solmon Ice at [facility], patient has been scheduled for a virtual colonoscopy at [facility] on Friday, 10/30/20 at 9 am. [address], [address]. Pick up prep and instructions 4 days prior.   Spoke with patient in regards to recommendations as below. Patient states that he is currently taking Protonix 40 mg daily, he has been advised to continue this. He is aware that we have ordered the Upper GI series and schedulers will contact him directly to schedule. Patient advised that he will need to let us know if he has not heard from the schedulers within 2 weeks. Pt has been advised of virtual colonoscopy appt. Patient requested that I call him back and leave all of this information on his vm. Patient verbalized understanding and had no concerns at the end of the call.   Left detailed message for patient will all recommendations.

## 2020-10-22 ENCOUNTER — Ambulatory Visit
Admission: RE | Admit: 2020-10-22 | Discharge: 2020-10-22 | Disposition: A | Discharge status: Home / Self Care | Payer: Medicare Other | Source: Ambulatory Visit | Attending: Internal Medicine | Admitting: Internal Medicine

## 2020-10-22 ENCOUNTER — Other Ambulatory Visit: Payer: Self-pay

## 2020-10-22 ENCOUNTER — Other Ambulatory Visit: Payer: Self-pay | Admitting: Internal Medicine

## 2020-10-22 DIAGNOSIS — R195 Other fecal abnormalities: Secondary | ICD-10-CM

## 2020-10-22 DIAGNOSIS — R634 Abnormal weight loss: Secondary | ICD-10-CM

## 2020-10-29 ENCOUNTER — Other Ambulatory Visit: Payer: Self-pay

## 2020-10-29 ENCOUNTER — Ambulatory Visit (INDEPENDENT_AMBULATORY_CARE_PROVIDER_SITE_OTHER)
Admission: RE | Admit: 2020-10-29 | Discharge: 2020-10-29 | Disposition: A | Discharge status: Home / Self Care | Payer: Medicare Other | Source: Ambulatory Visit | Attending: Pulmonary Disease | Admitting: Pulmonary Disease

## 2020-10-29 DIAGNOSIS — U099 Post covid-19 condition, unspecified: Secondary | ICD-10-CM | POA: Diagnosis not present

## 2020-10-29 DIAGNOSIS — J8410 Pulmonary fibrosis, unspecified: Secondary | ICD-10-CM

## 2020-10-30 ENCOUNTER — Inpatient Hospital Stay: Admission: RE | Admit: 2020-10-30 | Payer: Medicare Other | Source: Ambulatory Visit

## 2020-11-03 ENCOUNTER — Ambulatory Visit (INDEPENDENT_AMBULATORY_CARE_PROVIDER_SITE_OTHER): Payer: Medicare Other | Admitting: Neurology

## 2020-11-03 ENCOUNTER — Encounter: Payer: Self-pay | Admitting: Neurology

## 2020-11-03 ENCOUNTER — Telehealth: Payer: Self-pay | Admitting: Neurology

## 2020-11-03 VITALS — BP 120/76 | HR 69 | Ht 70.0 in | Wt 187.0 lb

## 2020-11-03 DIAGNOSIS — Z7952 Long term (current) use of systemic steroids: Secondary | ICD-10-CM | POA: Diagnosis not present

## 2020-11-03 DIAGNOSIS — M316 Other giant cell arteritis: Secondary | ICD-10-CM | POA: Diagnosis not present

## 2020-11-03 MED ORDER — PREDNISONE 5 MG PO TABS: ORAL_TABLET | ORAL | 3 refills | Status: DC

## 2020-11-03 NOTE — Telephone Encounter (Signed)
35mg , 30mg  (Start Monday 7/18), 25mg  (start 8/1), 20mg  (start 8/15), 17.5mg  (8/29), 15mg  (9/12), 12.5mg  (9/26) then 10mg  and hold

## 2020-11-03 NOTE — Patient Instructions (Addendum)
-   gradually reduce the dose by 5 mg every 2 weeks to 20 mg. - When patient is at 20 mg reduce the dose by 2.5 mg every 2 weeks to 10 mg and hold and come for follow up.   You are on 35mg  daily of steroids.  35mg  for 2 weeks 30mg  for 2 weeks (Start Monday 7/18) 25mg  for 2 weeks (start 8/1) 20mg  for 2 weeks (start 8/15) 17.5 for 2 weeks (8/29) 15 for 2 weeks (9/12) 12.5 for 2 weeks (9/26) 10mg  (02/01/2021) and after this we may decrease by 1mg  or 2.5mg  every month until done  Follow up 14 weeks - 16 weeks (you will be on 10mg  at that time)

## 2020-11-03 NOTE — Progress Notes (Signed)
[id number]    Provider:  Dr Jaynee Eagles Requesting Provider: Ginger Organ., MD Primary Care Provider:  Ginger Organ., MD  CC:  Steroids  11/03/2020: Discussed the following   He is on 69m of steroids, he is not watching his glucose, I will call Dr. SBrigitte Pulse Discussed he will be on steroids for some time and we need to manage his glucose.   - gradually reduce the dose by 5 mg every 2 weeks to 20 mg. - When patient is at 20 mg reduce the dose by 2.5 mg every 2 weeks to 10 mg and hold and come for follow up.   You are on 339mdaily of steroids.  3536mor 2 weeks 51m45mr 2 weeks (Start Monday 7/18) 25mg38m 2 weeks (start 8/1) 20mg 11m2 weeks (start 8/15) 17.5 for 2 weeks (8/29) 15 for 2 weeks (9/12) 12.5 for 2 weeks (9/26) 10mg (79m0/2022) and after this we may decrease by 1mg or 57mmg ever54month until done  Will reach out to Dr. Shaw abouBrigitte Pulsetting him some diabetic education and maybe metformin.   HPI:  Alex [name] y.o. m78e here as requested by Shaw, WilGinger Organ Headaches.  Past medical history amyloidosis nephrotic syndrome [facility], hyperlipidemia, COVID bilateral pneumonia, hypertension, seizure disorder last in 1978 generalized tonic-clonic type II, GERD, OSA on BiPAP, pulmonary fibrosis and chronic respiratory failure with hypoxia discontinuing his CPAP recently due to his headache, right parotid surgery, former smoker stopped in 1971 started in 1956 but was very heavy 40+ a day, 2-3 alcoholic drinks a week.   I reviewed Dr. Shah's ofTrena Plattotes: Sed rate was 60 and a trial of steroids was provided.  He reports persistent constant headaches like squeezing over the sides of the head, steady pressure with sudden jolt of pain, MRI was normal, steroids helped the first day or 2 but then returned, muscle relaxants have not helped, history of cluster headaches in the 1990s which were  constant, no other headache pattern since then.  Temporary improvement with steroids.  Normal MRI.  They proceeded with general surgery consultation to consider temporal artery biopsy.  Consider MRI of the cervical spine.  He is here with his wife, started having jaw pain, radiated to the head, worse on the right but on both sides, he had a slight fever, wife provides much information, low-grade fevers, fatigued and achy, wanted to sleep a lot he was tired, the vessels were popping out on the head and were large, MRI of the brain was negative, biopsy positive for temporal arteritis,scar is healing well,  He had a headache, he was also having double vision. Started on steroids and improving. His shoulders did not hurt. He is in remission for kidney cancer. Jaw pain is improved, fatigue, improved almost right away on the steroids. No other focal neurologic deficits, associated symptoms, inciting events or modifiable factors.  Biopsy was positive for temporal arteritis.   Reviewed notes, labs and imaging from outside physicians, which showed: Lab work 07/06/2020 showed a CMP with creatinine 1.3, BUN 20, otherwise unremarkable, CBC unremarkable slightly reduced platelets 132, TSH normal, A1c 5.4.  MRI brain 09/04/2020: IMPRESSION: No acute or subacute finding. No left occipital stroke as was suggested by CT. Reviewed images and agree  Age related volume loss. Mild chronic small-vessel ischemic changes of the cerebral hemispheric white matter. No finding seen to explain headache.  Reviewed  biopsy/pathology report: Granulomatous arteritis  Review of Systems: Patient complains of symptoms per HPI as well as the following symptoms Temporal Arteritis. Pertinent negatives and positives per HPI. All others negative.   Social History   Socioeconomic History   Marital status: Married    Spouse name: Not on file   Number of children: 2   Years of education: 12   Highest education level: Not on file   Occupational History   Occupation: Retired  Tobacco Use   Smoking status: Former    Packs/day: 3.00    Years: 20.00    Pack years: 60.00    Types: Cigarettes    Quit date: 04/25/1969    Years since quitting: 51.5   Smokeless tobacco: Former    Types: Chew    Quit date: 11/07/1983  Vaping Use   Vaping Use: Never used  Substance and Sexual Activity   Alcohol use: Yes    Comment: RARE   Drug use: Never   Sexual activity: Not Currently    Comment: SHIM 1. Reports erectile dysfunction.;    Other Topics Concern   Not on file  Social History Narrative   ** Merged History Encounter **   Lives at home w/ his wife   Right-handed   Caffeine: Dr Malachi Bonds 16 oz/week   Social Determinants of Health   Financial Resource Strain: Not on file  Food Insecurity: Not on file  Transportation Needs: Not on file  Physical Activity: Not on file  Stress: Not on file  Social Connections: Not on file  Intimate Partner Violence: Not on file    Family History  Problem Relation Age of Onset   Hearing loss Mother    Diabetes Father    Heart disease Father    Heart attack Father 16   Hypertension Father    Cancer Sister        1 sister had cancer but patient unsure of type   Eczema Sister    Hypertension Sister    Thyroid disease Sister    Hyperlipidemia Other    Seizures Other    Colon cancer Neg Hx    Stroke Neg Hx    Breast cancer Neg Hx    Pancreatic cancer Neg Hx    Prostate cancer Neg Hx     Past Medical History:  Diagnosis Date   AL amyloidosis [facility]) oncologist-- dr [name]   Renal and Systemic , dx 04/ 2018;  renal bx 08-05-2016 and bone marrow bx 08-29-2016;  8 month chemotherapy then stell cell transplant 12/ 2018 then chemo again;  since then been on oral chemo daily (11-07-2019 per pt has been on hold until after prostate seed implants)   Arthritis    Atypical chest pain    CAD (coronary artery disease) cardiologist-- dr [name];  [facility]  [place] dr [name]   moderate nonobstrucive disease by cath 2011; Lexiscan Myoview 6/14--Normal study, no scar or ischemia, EF 62%;  11-14-2014 nuclear study low risk w/ normal perfusion, nuclear ef 67%   CKD (chronic kidney disease), stage III (HCC)    COVID    b/l PNA (7/20)- 21 day hospitalization tx with steroids, Remdesevir, Actemra, Hydroxychloroquine) --> PE on Eliquis.   Diverticulosis of colon    Dyspnea    WITH EXERTION    ED (erectile dysfunction)    GERD (gastroesophageal reflux disease)    H/O stem cell transplant ([place]) 03/2017   Headache    Heart murmur  Hiatal hernia    History of 2019 novel coronavirus disease (COVID-19) 09/2018   [facility] 06[facility], hospital admission 10-28-2019 respiratory failure/ pneumometrostinom/ PE ;  was not intubated , treatment with remdesivir/ steroids/ actemra/ plasma   History of basal cell carcinoma (BCC)    History of colon polyps    History of MRSA infection    culture positive   History of pulmonary embolus (PE) 10/2018   with covid 19, completed treatment with eliquis   History of seizures    11-07-2019  per pt last one 1970s, unknown cause   History of vertebral compression fracture 1988   lumbar   Hx of adenomatous colonic polyps    negative in 7/08   Hyperlipidemia    Hypertension    IFG (impaired fasting glucose)    Neuropathy due to chemotherapeutic drug ([place])    fingers tips and feet   Nocturia    On supplemental oxygen therapy    post covid 19;   11-07-2019 pt uses O2 every night 2L via [place],  and uses supplements during the day when walking around neighborhood and goes out to appointments/ store that he has to walk a distance   OSA (obstructive sleep apnea)    study in epic 03-18-2005 mild to moderate   (11-07-2019  per pt used cpap up until when he lost alot of weight, stoped approx. 2012   Prostate cancer [facility]) urologist--- dr pace/  oncology-- dr Tammi Klippel   dx 03/ 2021   Pulmonary fibrosis  ([place]) 09/2018   pulmonologist-- dr Vaughan Browner;   post covid 19   Seizure ([place])    1st in 1976; last in 1978 GTC x 2    SOB (shortness of breath) on exertion    11-07-2019  per pt walks around neighborhood for 30 minutes twice daily, he does have to stop frequently d/t sob and since weather has been hot he uses his supplemental oxygen;  stated he has a pulse ox. at home , when he is sitting down O2 sat 96 -97% on RA when up walking around the house O2 sat 93-94% on RA;  pt stated he carry's his O2 w/ him when he goes to doctor appts/store d/t distance   Wears glasses    Wears hearing aid in both ears     Patient Active Problem List   Diagnosis Date Noted   Temporal arteritis ([place]) 09/24/2020   Sedimentation rate elevation 09/15/2020   Headache 09/15/2020   Malignant neoplasm of prostate ([place]) 08/06/2019   Pneumonia due to COVID-19 virus 10/29/2018   Acute respiratory failure with hypoxia ([place]) 10/29/2018   Thrombocytopenia ([place]) 10/29/2018   Left lower lobe pneumonia 04/22/2018   Sepsis due to pneumonia ([place]) 04/22/2018   Engraftment syndrome following stem cell transplantation ([place]) 04/27/2017   H/O autologous stem cell transplant ([place]) 04/10/2017   Aortic stenosis 03/08/2017   Hearing loss 03/08/2017   OSA (obstructive sleep apnea) 03/08/2017   Parotid mass 02/23/2017   Dysfunction of right eustachian tube 02/06/2017   Presbycusis of both ears 72/53/6644   Folliculitis 03/47/4259   AL amyloidosis ([place]) 56/38/7564   Umbilical hernia 33/29/5188   DIZZINESS 02/19/2010   CAD, NATIVE VESSEL 07/21/2009   CHEST PAIN-UNSPECIFIED 07/21/2009   HYPERLIPIDEMIA 04/30/2007   Essential hypertension 04/30/2007   GASTROESOPHAGEAL REFLUX DISEASE 04/30/2007   SEIZURE DISORDER 04/30/2007   COMPRESSION FRACTURE, L1 VERTEBRA 04/30/2007   DIVERTICULOSIS, COLON, HX OF 04/30/2007   Other postprocedural status(V45.89) 04/30/2007  Past Surgical History:  Procedure Laterality Date   ARTERY BIOPSY  Right 09/17/2020   Procedure: RIGHT TEMPORAL ARTERY BIOPSY;  Surgeon: Armandina Gemma, MD;  Location: [facility];  Service: General;  Laterality: Right;   CARDIAC CATHETERIZATION  05-21-1999  and 12-29-2000  '@[facility]'   dr Maryjo [place] nuclear study;  moderate nonobstructive disease involving LAD and LCx;  second done due to in Reversal research trial   CARDIAC CATHETERIZATION  07/07/2009  dr [name]   nonobstructive disease w/ normal LVF   CARPAL TUNNEL RELEASE Left [place]   C2 -- 4   CYSTOSCOPY N/A 11/13/2019   Procedure: CYSTOSCOPY FLEXIBLE;  Surgeon: [name], MD;  Location: [facility];  Service: Urology;  Laterality: N/A;  no seeds detected per Dr. Claudia Desanctis   FOOT SURGERY Bilateral x3 last one 1980s   bunionectomy great toe--- x2 right and x1 left   KNEE ARTHROSCOPY Left 1996   NASAL SEPTUM SURGERY  x3 last one 08-09-2010 @ [place]   PAROTIDECTOMY Right 03/09/2017   RADIOACTIVE SEED IMPLANT N/A 11/13/2019   Procedure: RADIOACTIVE SEED IMPLANT/BRACHYTHERAPY IMPLANT;  Surgeon: [name], MD;  Location: [facility];  Service: Urology;  Laterality: N/A;  ONLY NEEDS 90 MIN FOR ALL PROCEDURES   ROTATOR CUFF REPAIR Bilateral x1 [place] (unsure date);  x2 left last one 09-07-2009 '@[facility]' ;  right   SPACE OAR INSTILLATION N/A 11/13/2019   Procedure: SPACE OAR INSTILLATION;  Surgeon: [name], MD;  Location: [facility];  Service: Urology;  Laterality: N/A;   TRIGGER FINGER RELEASE Bilateral x2  last one 2001 approx.   x1 finger left hand;  x2 fingers right hand   UMBILICAL HERNIA REPAIR  01/19/2011   VASECTOMY  approx. 1968 (with anesthesia)    Current Outpatient Medications  Medication Sig Dispense Refill   acyclovir (ZOVIRAX) 200 MG capsule Take 200 mg by mouth 2 (two) times daily.     Ascorbic Acid (VITAMIN C) 1000 MG tablet Take 1,000 mg by mouth 2 (two) times daily.     atorvastatin (LIPITOR) 40 MG tablet Take 40 mg by  mouth at bedtime.      b complex vitamins capsule Take 1 capsule by mouth daily.     carvedilol (COREG) 6.25 MG tablet Take 6.25 mg by mouth every morning.     Cholecalciferol 25 MCG (1000 UT) tablet Take 1,000 Units by mouth 2 (two) times a day.      ezetimibe (ZETIA) 10 MG tablet Take 1 tablet by mouth daily.     fluticasone (FLONASE) 50 MCG/ACT nasal spray Place 2 sprays into both nostrils daily. 16 g 5   folic acid (FOLVITE) 1 MG tablet Take 1 mg by mouth at bedtime.      furosemide (LASIX) 40 MG tablet Take 40 mg by mouth daily.      Ipratropium-Albuterol (COMBIVENT RESPIMAT) 20-100 MCG/ACT AERS respimat Inhale 1 puff into the lungs every 6 (six) hours as needed for wheezing or shortness of breath.     isosorbide mononitrate (IMDUR) 15 mg TB24 24 hr tablet Take 15 mg by mouth daily.     Multiple Vitamin (MULTI-VITAMINS) TABS Take 1 tablet by mouth at bedtime.     mupirocin ointment (BACTROBAN) 2 % Apply to skin lesion TID     nitroGLYCERIN (NITROSTAT) 0.4 MG SL tablet Place 1 tablet (0.4 mg total) under the tongue every 5 (five) minutes as needed for chest pain. 25  tablet 3   pantoprazole (PROTONIX) 40 MG tablet Take 40 mg by mouth daily.      potassium chloride SA (KLOR-CON) 20 MEQ tablet Take 20 mEq by mouth daily.     predniSONE (DELTASONE) 20 MG tablet 70m(3 tabs) daily for 2 weeks then reduce to 50 mg(2.5 tabs) daily for 2 weeks then 40 mg(2 tabs) daily for 4 weeks and hold at this dose until you come back to the office. Take in the morning with food. 200 tablet 0   tamsulosin (FLOMAX) 0.4 MG CAPS capsule Take 0.4 mg by mouth 2 (two) times daily.     vitamin B-12 (CYANOCOBALAMIN) 500 MCG tablet Take 500 mcg by mouth daily.     aspirin EC 81 MG tablet Take 81 mg by mouth daily. (Patient not taking: Reported on 11/03/2020)     No current facility-administered medications for this visit.    Allergies as of 11/03/2020 - Review Complete 11/03/2020  Allergen Reaction Noted   Ramipril  Cough 07/06/2017    Vitals: BP 120/76   Pulse 69   Ht '5\' 10"'  (1.778 m)   Wt 187 lb (84.8 kg)   BMI 26.83 kg/m  Last Weight:  Wt Readings from Last 1 Encounters:  11/03/20 187 lb (84.8 kg)   Last Height:   Ht Readings from Last 1 Encounters:  11/03/20 '5\' 10"'  (1.778 m)     Physical exam: Exam: Gen: NAD, conversant, well nourised, well groomed                     CV: breathing rate normal,  No peripheral edema, warm, nontender Eyes: Conjunctivae clear without exudates or hemorrhage  Neuro: Detailed Neurologic Exam  Speech:    Speech is normal; fluent and spontaneous with normal comprehension.  Cognition:    The patient is oriented to person, place, and time;     recent and remote memory intact;     language fluent;     normal attention, concentration,     fund of knowledge Cranial Nerves:    The pupils are equal, round, and reactive to light. Pupils too small to visualize fundi.  Visual fields are full to finger confrontation. Extraocular movements are intact. Trigeminal sensation is intact and the muscles of mastication are normal. The face is symmetric. The palate elevates in the midline. Hearing intact. Voice is normal. Shoulder shrug is normal. The tongue has normal motion without fasciculations.   Coordination:    Normal finger to nose and heel to shin. Normal rapid alternating movements.   Gait:    Heel-toe and tandem gait are normal.   Motor Observation:    No asymmetry, no atrophy, and no involuntary movements noted. Tone:    Normal muscle tone.    Posture:    Posture is normal. normal erect    Strength:    Strength is symmetrical in the upper and lower limbs.      Sensation: intact to LT     Reflex Exam:  DTR's:    Deep tendon reflexes in the upper and lower extremities are symmetrical bilaterally.   Toes:    The toes are downgoing bilaterally.   Clonus:    Clonus is absent.    Assessment/Plan: This is a patient with temporal arteritis.  On  prednisone 60 mg.  We will slowly decrease the prednisone and monitor his ESR and CRP.  He is on 389mof steroids, he is not watching his glucose, I will call Dr. ShBrigitte PulseDiscussed  he will be on steroids for some time and we need to manage his glucose.   - gradually reduce the dose by 5 mg every 2 weeks to 20 mg. - When patient is at 20 mg reduce the dose by 2.5 mg every 2 weeks to 10 mg and hold and come for follow up.   You are on 41m daily of steroids.  340mfor 2 weeks 3029mor 2 weeks (Start Monday 7/18) 15m66mr 2 weeks (start 8/1) 20mg108m 2 weeks (start 8/15) 17.5 for 2 weeks (8/29) 15 for 2 weeks (9/12) 12.5 for 2 weeks (9/26) 10mg 58m10/2022) and after this we may decrease by 1mg or49m5mg eve30mmonth until done   PRIOR Assessment and plan  - 60mg for49meeks then reduced to 50 mg/day for 2 weeks then 40 mg/day for 4 weeks. Return to clinic in 6-8 weeks for check on symptoms, check on esr/crp and for further titration as below. -Check ESR and CRP monthly unless symptoms worsen then check again and adjust taper. -Subsequently gradually reduce the dose by 5 mg every 2 weeks to 20 mg -When patient is at 20 mg reduce the dose by 2.5 mg every 2 weeks to 10 mg -And patient is at 10 mg can reduce the dose by 1 mg each month -Monitor patient may be on steroids for 6 to 12 months depending on his ESR CRP and symptoms. - need to follow up with Dr. Shaw to mBrigitte Pulsee glucose/diabetes - Continue protonix, take medications in the morning with food, watch for stomach upset/dark stools.   Orders Placed This Encounter  Procedures   Sedimentation rate   C-reactive protein   Hemoglobin A1c   CBC with Differential/Platelets   Comprehensive metabolic panel    No orders of the defined types were placed in this encounter.   Cc: Shaw, WilGinger Organhaw, WilGinger Organtonia ASarina [facility] [phone] over 40 minutes of face-to-face and non-face-to-face time with patient on the  1. GCA (giant cell arteritis) (HCC)   2.Scurryng term (current) use of systemic steroids    diagnosis.  This included previsit chart review, lab review, study review, order entry, electronic health record documentation, patient education on the different diagnostic and therapeutic options, counseling and coordination of care, risks and benefits of management, compliance, or risk factor reduction

## 2020-11-03 NOTE — Telephone Encounter (Signed)
[facility] Alex [name]) called, clarification on instructions for prescription for predniSONE (DELTASONE) 5 MG tablet

## 2020-11-04 ENCOUNTER — Telehealth: Payer: Self-pay | Admitting: *Deleted

## 2020-11-04 LAB — CBC WITH DIFFERENTIAL/PLATELET
Basophils Absolute: 0.1 10*3/uL (ref 0.0–0.2)
Basos: 1 %
EOS (ABSOLUTE): 0 10*3/uL (ref 0.0–0.4)
Eos: 0 %
Hematocrit: 47.9 % (ref 37.5–51.0)
Hemoglobin: 15.8 g/dL (ref 13.0–17.7)
Immature Grans (Abs): 0.2 10*3/uL — ABNORMAL HIGH (ref 0.0–0.1)
Immature Granulocytes: 2 %
Lymphocytes Absolute: 1.4 10*3/uL (ref 0.7–3.1)
Lymphs: 12 %
MCH: 31.3 pg (ref 26.6–33.0)
MCHC: 33 g/dL (ref 31.5–35.7)
MCV: 95 fL (ref 79–97)
Monocytes Absolute: 0.5 10*3/uL (ref 0.1–0.9)
Monocytes: 4 %
Neutrophils Absolute: 9.5 10*3/uL — ABNORMAL HIGH (ref 1.4–7.0)
Neutrophils: 81 %
Platelets: 126 10*3/uL — ABNORMAL LOW (ref 150–450)
RBC: 5.04 x10E6/uL (ref 4.14–5.80)
RDW: 15 % (ref 11.6–15.4)
WBC: 11.6 10*3/uL — ABNORMAL HIGH (ref 3.4–10.8)

## 2020-11-04 LAB — HEMOGLOBIN A1C
Est. average glucose Bld gHb Est-mCnc: 140 mg/dL
Hgb A1c MFr Bld: 6.5 % — ABNORMAL HIGH (ref 4.8–5.6)

## 2020-11-04 LAB — COMPREHENSIVE METABOLIC PANEL
ALT: 58 IU/L — ABNORMAL HIGH (ref 0–44)
AST: 30 IU/L (ref 0–40)
Albumin/Globulin Ratio: 2.2 (ref 1.2–2.2)
Albumin: 4.3 g/dL (ref 3.7–4.7)
Alkaline Phosphatase: 77 IU/L (ref 44–121)
BUN/Creatinine Ratio: 28 — ABNORMAL HIGH (ref 10–24)
BUN: 36 mg/dL — ABNORMAL HIGH (ref 8–27)
Bilirubin Total: 1 mg/dL (ref 0.0–1.2)
CO2: 25 mmol/L (ref 20–29)
Calcium: 9 mg/dL (ref 8.6–10.2)
Chloride: 98 mmol/L (ref 96–106)
Creatinine, Ser: 1.29 mg/dL — ABNORMAL HIGH (ref 0.76–1.27)
Globulin, Total: 2 g/dL (ref 1.5–4.5)
Glucose: 118 mg/dL — ABNORMAL HIGH (ref 65–99)
Potassium: 4.4 mmol/L (ref 3.5–5.2)
Sodium: 139 mmol/L (ref 134–144)
Total Protein: 6.3 g/dL (ref 6.0–8.5)
eGFR: 57 mL/min/{1.73_m2} — ABNORMAL LOW (ref >59)

## 2020-11-04 LAB — C-REACTIVE PROTEIN: CRP: <1 mg/L (ref 0–10)

## 2020-11-04 LAB — SEDIMENTATION RATE: Sed Rate: 4 mm/hr (ref 0–30)

## 2020-11-04 NOTE — Telephone Encounter (Addendum)
I called the patient and LVM asking for call back. Left office number in message.   ----- Message from Melvenia Beam, MD sent at 11/04/2020 10:42 AM EDT ----- Regarding: FW: a mutual patient Please call patient and let him know that I reached out to Dr. [name]. Dr. [name] will be calling him to make them an appointment with a diabetes educator. Thanks

## 2020-11-04 NOTE — Telephone Encounter (Signed)
-----   Message from Melvenia Beam, MD sent at 11/04/2020 10:37 AM EDT ----- Sed rate and crp are normal, this is great. But his diabetes is now worse, pending a call to Dr. [name]. Will let him know when I speak to him thanks

## 2020-11-04 NOTE — Telephone Encounter (Signed)
I spoke to Alex [name] and relayed the results per Dr. Jaynee Eagles. ESR CRP normal.  HA1C 6.5%. Alex [name] stated has appt today at 1500 with Dr. [name].  Will forward results to him. DONE.   Alex [name] verbalized understanding.

## 2020-11-04 NOTE — Telephone Encounter (Signed)
Returned call to [facility] and spoke with pharmacist who took the call for Alex [name]. I clarified with him (per Dr Jaynee Eagles) that patient is currently on 35 mg of prednisone. On Monday 7/18 he decreases to 25 mg and so forth as per prescription, titrates down and holds at 10 mg. All once daily dosing. He verbalized understanding and stated they will leave off the 35 mg in the prescription since patient is already on that.

## 2020-11-05 NOTE — Telephone Encounter (Signed)
Spoke with patient.  He stated the diabetes educator called him yesterday and told him to come pick up a tester. He thinks this may be related but will check to be sure. He verbalized appreciation for the call.

## 2020-11-18 NOTE — Progress Notes (Signed)
Spoke with pt and notified of results per Dr. Mannam.  Pt verbalized understanding and denied any questions. 

## 2020-11-27 ENCOUNTER — Ambulatory Visit
Admission: RE | Admit: 2020-11-27 | Discharge: 2020-11-27 | Disposition: A | Discharge status: Home / Self Care | Payer: Medicare Other | Source: Ambulatory Visit | Attending: Internal Medicine | Admitting: Internal Medicine

## 2020-11-27 DIAGNOSIS — R195 Other fecal abnormalities: Secondary | ICD-10-CM

## 2020-11-27 DIAGNOSIS — R634 Abnormal weight loss: Secondary | ICD-10-CM

## 2020-12-11 ENCOUNTER — Other Ambulatory Visit: Payer: Self-pay | Admitting: Internal Medicine

## 2020-12-11 DIAGNOSIS — R1031 Right lower quadrant pain: Secondary | ICD-10-CM

## 2020-12-18 ENCOUNTER — Other Ambulatory Visit: Payer: Self-pay

## 2020-12-18 ENCOUNTER — Ambulatory Visit
Admission: RE | Admit: 2020-12-18 | Discharge: 2020-12-18 | Disposition: A | Discharge status: Home / Self Care | Payer: Medicare Other | Source: Ambulatory Visit | Attending: Internal Medicine | Admitting: Internal Medicine

## 2020-12-18 DIAGNOSIS — R1031 Right lower quadrant pain: Secondary | ICD-10-CM

## 2020-12-18 MED ORDER — IOPAMIDOL (ISOVUE-300) INJECTION 61%
100.0000 mL | Freq: Once | INTRAVENOUS | Status: AC | PRN
  Administered 2020-12-18: 100 mL via INTRAVENOUS

## 2020-12-25 ENCOUNTER — Other Ambulatory Visit: Payer: Self-pay

## 2020-12-25 ENCOUNTER — Inpatient Hospital Stay: Payer: Medicare Other | Attending: Hematology

## 2020-12-25 DIAGNOSIS — Z87891 Personal history of nicotine dependence: Secondary | ICD-10-CM | POA: Insufficient documentation

## 2020-12-25 DIAGNOSIS — Z79899 Other long term (current) drug therapy: Secondary | ICD-10-CM | POA: Diagnosis not present

## 2020-12-25 DIAGNOSIS — Z7982 Long term (current) use of aspirin: Secondary | ICD-10-CM | POA: Diagnosis not present

## 2020-12-25 DIAGNOSIS — E854 Organ-limited amyloidosis: Secondary | ICD-10-CM | POA: Insufficient documentation

## 2020-12-25 DIAGNOSIS — Z86711 Personal history of pulmonary embolism: Secondary | ICD-10-CM | POA: Insufficient documentation

## 2020-12-25 DIAGNOSIS — E8581 Light chain (AL) amyloidosis: Secondary | ICD-10-CM

## 2020-12-25 LAB — CBC WITH DIFFERENTIAL/PLATELET
Abs Immature Granulocytes: 0.23 10*3/uL — ABNORMAL HIGH (ref 0.00–0.07)
Basophils Absolute: 0.1 10*3/uL (ref 0.0–0.1)
Basophils Relative: 1 %
Eosinophils Absolute: 0.3 10*3/uL (ref 0.0–0.5)
Eosinophils Relative: 3 %
HCT: 43.7 % (ref 39.0–52.0)
Hemoglobin: 15.1 g/dL (ref 13.0–17.0)
Immature Granulocytes: 3 %
Lymphocytes Relative: 11 %
Lymphs Abs: 1 10*3/uL (ref 0.7–4.0)
MCH: 33.4 pg (ref 26.0–34.0)
MCHC: 34.6 g/dL (ref 30.0–36.0)
MCV: 96.7 fL (ref 80.0–100.0)
Monocytes Absolute: 0.6 10*3/uL (ref 0.1–1.0)
Monocytes Relative: 6 %
Neutro Abs: 7.2 10*3/uL (ref 1.7–7.7)
Neutrophils Relative %: 76 %
Platelets: 141 10*3/uL — ABNORMAL LOW (ref 150–400)
RBC: 4.52 MIL/uL (ref 4.22–5.81)
RDW: 15.9 % — ABNORMAL HIGH (ref 11.5–15.5)
WBC: 9.4 10*3/uL (ref 4.0–10.5)
nRBC: 0.2 % (ref 0.0–0.2)

## 2020-12-25 LAB — CMP (CANCER CENTER ONLY)
ALT: 94 U/L — ABNORMAL HIGH (ref 0–44)
AST: 48 U/L — ABNORMAL HIGH (ref 15–41)
Albumin: 3.5 g/dL (ref 3.5–5.0)
Alkaline Phosphatase: 73 U/L (ref 38–126)
Anion gap: 12 (ref 5–15)
BUN: 24 mg/dL — ABNORMAL HIGH (ref 8–23)
CO2: 29 mmol/L (ref 22–32)
Calcium: 9.4 mg/dL (ref 8.9–10.3)
Chloride: 104 mmol/L (ref 98–111)
Creatinine: 1.35 mg/dL — ABNORMAL HIGH (ref 0.61–1.24)
GFR, Estimated: 54 mL/min — ABNORMAL LOW (ref >60)
Glucose, Bld: 199 mg/dL — ABNORMAL HIGH (ref 70–99)
Potassium: 3.7 mmol/L (ref 3.5–5.1)
Sodium: 145 mmol/L (ref 135–145)
Total Bilirubin: 0.9 mg/dL (ref 0.3–1.2)
Total Protein: 6.7 g/dL (ref 6.5–8.1)

## 2020-12-26 ENCOUNTER — Other Ambulatory Visit: Payer: Self-pay | Admitting: Cardiovascular Disease

## 2020-12-29 LAB — KAPPA/LAMBDA LIGHT CHAINS
Kappa free light chain: 18.9 mg/L (ref 3.3–19.4)
Kappa, lambda light chain ratio: 0.96 (ref 0.26–1.65)
Lambda free light chains: 19.7 mg/L (ref 5.7–26.3)

## 2020-12-30 LAB — MULTIPLE MYELOMA PANEL, SERUM
Albumin SerPl Elph-Mcnc: 3.2 g/dL (ref 2.9–4.4)
Albumin/Glob SerPl: 1.3 (ref 0.7–1.7)
Alpha 1: 0.3 g/dL (ref 0.0–0.4)
Alpha2 Glob SerPl Elph-Mcnc: 0.9 g/dL (ref 0.4–1.0)
B-Globulin SerPl Elph-Mcnc: 0.9 g/dL (ref 0.7–1.3)
Gamma Glob SerPl Elph-Mcnc: 0.4 g/dL (ref 0.4–1.8)
Globulin, Total: 2.5 g/dL (ref 2.2–3.9)
IgA: 110 mg/dL (ref 61–437)
IgG (Immunoglobin G), Serum: 586 mg/dL — ABNORMAL LOW (ref 603–1613)
IgM (Immunoglobulin M), Srm: 66 mg/dL (ref 15–143)
Total Protein ELP: 5.7 g/dL — ABNORMAL LOW (ref 6.0–8.5)

## 2021-01-01 ENCOUNTER — Other Ambulatory Visit: Payer: Self-pay

## 2021-01-01 ENCOUNTER — Inpatient Hospital Stay (HOSPITAL_BASED_OUTPATIENT_CLINIC_OR_DEPARTMENT_OTHER): Payer: Medicare Other | Admitting: Hematology

## 2021-01-01 VITALS — BP 109/64 | HR 77 | Temp 98.9°F | Resp 18 | Ht 70.0 in | Wt 196.3 lb

## 2021-01-01 DIAGNOSIS — E8581 Light chain (AL) amyloidosis: Secondary | ICD-10-CM | POA: Diagnosis not present

## 2021-01-01 DIAGNOSIS — E854 Organ-limited amyloidosis: Secondary | ICD-10-CM | POA: Diagnosis not present

## 2021-01-04 ENCOUNTER — Telehealth: Payer: Self-pay | Admitting: Hematology

## 2021-01-04 NOTE — Telephone Encounter (Signed)
Left message with follow-up appointments per 9/9 los.

## 2021-01-08 NOTE — Progress Notes (Signed)
HEMATOLOGY/ONCOLOGY CLINIC NOTE  Date of Service: .01/01/2021   Patient Care Team: Ginger Organ., MD as PCP - General (Internal Medicine) Burnell Blanks, MD as PCP - Cardiology (Cardiology) Brunetta Genera, MD as Consulting Physician (Hematology) Nephrology - Dr Madelon Lips MD Dr Henrene Pastor MD - GI  CHIEF COMPLAINTS/PURPOSE OF CONSULTATION:  AL Amyloidosis -with Nephrotic syndrome . No overt CHF pEF  HISTORY OF PRESENTING ILLNESS:   Alex Reimers. is a wonderful 80 y.o. male who has been referred to [place] by Dr .[name], Emily Filbert., MD /Dr Madelon Lips MD for evaluation and management of newly diagnosed Kidney AL Amyloidosis with nephrotic syndrome.  Patient has a history of hypertension, dyslipidemia, coronary artery disease, seizure disorder on Dilantin who was apparently in his usual state of health until 3 months ago when he started developing new onset lower extremity edema. Patient had a UA at the time that showed 4+ protein in 24-hour collection revealed 15 g of protein. Additional workup showed a creatinine of 0.8 with an albumin of 2.3 and negative SPEP and UPEP. Apparently had an elevated K/L SFLC ratio of 5.19.  He was urgently referred by his primary care physician to nephrology for additional evaluation. Patient notes no bleeding issues. No nosebleeds. No periorbital bleeding. No abnormal skin rashes. Has had significant NSAID exposure and use to take a fair amount of naproxen for chronic low back pain but has cut down on this.  Due to lack of clear etiology for his nephrotic syndrome the patient underwent a kidney biopsy on 08/06/2015 accession [id number] which showed AL amyloidosis, lambda immunophenotype, [place] red positive. Noted to have severe arteriosclerosis with moderate tubular interstitial scarring.  Patient was referred to [place] for further evaluation and treatment of his AL Amyloidosis, Patient notes that his leg swelling has improved  with diuretic therapy.  He notes no weight loss. No night sweats. No focal bone pains. No skin rashes. Lungs normal bleeding or bruising.  Patient notes that he had a lot of reading online and he and his wife had an extensive list of questions which were answered in detail.  He notes he has had some chronic issues with upper abdominal pain and nausea and that he follows with Dr. Henrene Pastor and is apparently being scheduled for an EGD and colonoscopy according to his report.  Denies having and lacks tongue. No chest pain and no overt new shortness of breath. Patient is uncertain if an echocardiogram has been ordered or scheduled.  INTERVAL HISTORY:  Mr Alex [name] is here for a scheduled follow-up for his AL amyloidosis. We are joined today by his wife, Alex [name]. The patient's last visit with [place] was on 04/13/2020.The pt reports that he is doing well overall.  The pt reports that he feels very stable over the last six months.  His headaches from the GCA have resolved.  She is still on his tapering doses of prednisone. 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  MEDICAL HISTORY:  Past Medical History:  Diagnosis Date   AL amyloidosis [facility]) oncologist-- dr [name]   Renal and Systemic , dx 04/ 2018;  renal bx 08-05-2016 and bone marrow bx 08-29-2016;  8 month chemotherapy then stell cell transplant 12/ 2018 then chemo again;  since then been on oral chemo daily (11-07-2019 per pt has  been on hold until after prostate seed implants)   Arthritis    Atypical chest pain    CAD (coronary artery disease) cardiologist-- dr [name];  also followed by cardiologist w/ [place] in [place], dr [name]   moderate nonobstrucive disease by cath 2011; Lexiscan Myoview 6/14--Normal study, no scar or ischemia, EF 62%;  11-14-2014  nuclear study low risk w/ normal perfusion, nuclear ef 67%   CKD (chronic kidney disease), stage III (HCC)    COVID    b/l PNA (7/20)- 21 day hospitalization tx with steroids, Remdesevir, Actemra, Hydroxychloroquine) --> PE on Eliquis.   Diverticulosis of colon    Dyspnea    WITH EXERTION    ED (erectile dysfunction)    GERD (gastroesophageal reflux disease)    H/O stem cell transplant ([place]) 03/2017   Headache    Heart murmur    Hiatal hernia    History of 2019 novel coronavirus disease (COVID-19) 09/2018   [facility], hospital admission 10-28-2019 respiratory failure/ pneumometrostinom/ PE ;  was not intubated , treatment with remdesivir/ steroids/ actemra/ plasma   History of basal cell carcinoma (BCC)    History of colon polyps    History of MRSA infection    culture positive   History of pulmonary embolus (PE) 10/2018   with covid 19, completed treatment with eliquis   History of seizures    11-07-2019  per pt last one 1970s, unknown cause   History of vertebral compression fracture 1988   lumbar   Hx of adenomatous colonic polyps    negative in 7/08   Hyperlipidemia    Hypertension    IFG (impaired fasting glucose)    Neuropathy due to chemotherapeutic drug ([place])    fingers tips and feet   Nocturia    On supplemental oxygen therapy    post covid 19;   11-07-2019 pt uses O2 every night 2L via [place],  and uses supplements during the day when walking around neighborhood and goes out to appointments/ store that he has to walk a distance   OSA (obstructive sleep apnea)    study in epic 03-18-2005 mild to moderate   (11-07-2019  per pt used cpap up until when he lost alot of weight, stoped approx. 2012   Prostate cancer [facility]) urologist--- dr pace/  oncology-- dr Tammi Klippel   dx 03/ 2021   Pulmonary fibrosis ([place]) 09/2018   pulmonologist-- dr Vaughan Browner;   post covid 19   Seizure ([place])    1st in 1976; last in 1978 GTC x 2    SOB (shortness of breath) on exertion     11-07-2019  per pt walks around neighborhood for 30 minutes twice daily, he does have to stop frequently d/t sob and since weather has been hot he uses his supplemental oxygen;  stated he has a pulse ox. at home , when he is sitting down O2 sat 96 -97% on RA when up walking around the house O2 sat 93-94% on RA;  pt stated he carry's his O2 w/ him when he goes to doctor appts/store d/t distance   Wears glasses    Wears hearing aid in both ears   Obstructive sleep apnea Gastroesophageal reflux disease Aortic stenosis Erectile dysfunction Peripheral neuropathy Seizure disorder   SURGICAL HISTORY: Past Surgical History:  Procedure Laterality Date   ARTERY BIOPSY Right 09/17/2020   Procedure: RIGHT TEMPORAL ARTERY BIOPSY;  Surgeon: Armandina Gemma, MD;  Location: [facility];  Service: General;  Laterality:  Right;   CARDIAC CATHETERIZATION  05-21-1999  and 12-29-2000  '@[facility]'   dr Maryjo [place] nuclear study;  moderate nonobstructive disease involving LAD and LCx;  second done due to in Reversal research trial   CARDIAC CATHETERIZATION  07/07/2009  dr [name]   nonobstructive disease w/ normal LVF   CARPAL TUNNEL RELEASE Left [place]   C2 -- 4   CYSTOSCOPY N/A 11/13/2019   Procedure: CYSTOSCOPY FLEXIBLE;  Surgeon: [name], MD;  Location: [facility];  Service: Urology;  Laterality: N/A;  no seeds detected per Dr. Claudia Desanctis   FOOT SURGERY Bilateral x3 last one 1980s   bunionectomy great toe--- x2 right and x1 left   KNEE ARTHROSCOPY Left 1996   NASAL SEPTUM SURGERY  x3 last one 08-09-2010 @ [facility]   PAROTIDECTOMY Right 03/09/2017   RADIOACTIVE SEED IMPLANT N/A 11/13/2019   Procedure: RADIOACTIVE SEED IMPLANT/BRACHYTHERAPY IMPLANT;  Surgeon: [name], MD;  Location: [place];  Service: Urology;  Laterality: N/A;  ONLY NEEDS 90 MIN FOR ALL PROCEDURES   ROTATOR CUFF REPAIR Bilateral x1 [place]th (unsure date);  x2 left last one 09-07-2009 '@[facility]' ;   right   SPACE OAR INSTILLATION N/A 11/13/2019   Procedure: SPACE OAR INSTILLATION;  Surgeon: [name], MD;  Location: [facility];  Service: Urology;  Laterality: N/A;   TRIGGER FINGER RELEASE Bilateral x2  last one 2001 approx.   x1 finger left hand;  x2 fingers right hand   UMBILICAL HERNIA REPAIR  01/19/2011   VASECTOMY  approx. 56 (with anesthesia)    SOCIAL HISTORY: Social History   Socioeconomic History   Marital status: Married    Spouse name: Not on file   Number of children: 2   Years of education: 12   Highest education level: Not on file  Occupational History   Occupation: Retired  Tobacco Use   Smoking status: Former    Packs/day: 3.00    Years: 20.00    Pack years: 60.00    Types: Cigarettes    Quit date: 04/25/1969    Years since quitting: 51.7   Smokeless tobacco: Former    Types: Chew    Quit date: 11/07/1983  Vaping Use   Vaping Use: Never used  Substance and Sexual Activity   Alcohol use: Yes    Comment: RARE   Drug use: Never   Sexual activity: Not Currently    Comment: SHIM 1. Reports erectile dysfunction.;    Other Topics Concern   Not on file  Social History Narrative   ** Merged History Encounter **   Lives at home w/ his wife   Right-handed   Caffeine: Dr Malachi Bonds 16 oz/week   Social Determinants of Health   Financial Resource Strain: Not on file  Food Insecurity: Not on file  Transportation Needs: Not on file  Physical Activity: Not on file  Stress: Not on file  Social Connections: Not on file  Intimate Partner Violence: Not on file  Patient is currently retired.   FAMILY HISTORY: Family History  Problem Relation Age of Onset   Hearing loss Mother    Diabetes Father    Heart disease Father    Heart attack Father 25   Hypertension Father    Cancer Sister        1 sister had cancer but patient unsure of type   Eczema Sister    Hypertension Sister    Thyroid disease Sister  Hyperlipidemia Other     Seizures Other    Colon cancer Neg Hx    Stroke Neg Hx    Breast cancer Neg Hx    Pancreatic cancer Neg Hx    Prostate cancer Neg Hx     ALLERGIES:  is allergic to ramipril.  MEDICATIONS:  Current Outpatient Medications  Medication Sig Dispense Refill   acyclovir (ZOVIRAX) 200 MG capsule Take 200 mg by mouth 2 (two) times daily.     Ascorbic Acid (VITAMIN C) 1000 MG tablet Take 1,000 mg by mouth 2 (two) times daily.     aspirin EC 81 MG tablet Take 81 mg by mouth daily. (Patient not taking: Reported on 11/03/2020)     atorvastatin (LIPITOR) 40 MG tablet Take 40 mg by mouth at bedtime.      b complex vitamins capsule Take 1 capsule by mouth daily.     carvedilol (COREG) 6.25 MG tablet Take 6.25 mg by mouth every morning.     Cholecalciferol 25 MCG (1000 UT) tablet Take 1,000 Units by mouth 2 (two) times a day.      ezetimibe (ZETIA) 10 MG tablet Take 1 tablet by mouth daily.     fluticasone (FLONASE) 50 MCG/ACT nasal spray Place 2 sprays into both nostrils daily. 16 g 5   folic acid (FOLVITE) 1 MG tablet Take 1 mg by mouth at bedtime.      furosemide (LASIX) 40 MG tablet Take 40 mg by mouth daily.      Ipratropium-Albuterol (COMBIVENT RESPIMAT) 20-100 MCG/ACT AERS respimat Inhale 1 puff into the lungs every 6 (six) hours as needed for wheezing or shortness of breath.     isosorbide mononitrate (IMDUR) 15 mg TB24 24 hr tablet Take 15 mg by mouth daily.     Multiple Vitamin (MULTI-VITAMINS) TABS Take 1 tablet by mouth at bedtime.     mupirocin ointment (BACTROBAN) 2 % Apply to skin lesion TID     nitroGLYCERIN (NITROSTAT) 0.4 MG SL tablet DISSOLVE ONE TABLET UNDER TONGUE EVERY 5 MINUTES AS NEEDED UP TO 3 DOSES, IF NORELIEF CALL 911 25 tablet 8   pantoprazole (PROTONIX) 40 MG tablet Take 40 mg by mouth daily.      potassium chloride SA (KLOR-CON) 20 MEQ tablet Take 20 mEq by mouth daily.     predniSONE (DELTASONE) 5 MG tablet 28m, 360m(Start Monday 7/18), 2556mstart 8/1), 24m33mtart  8/15), 17.5mg 44m29), 15mg 55m2), 12.5mg (978m) then 10mg an21mld 180 tablet 3   tamsulosin (FLOMAX) 0.4 MG CAPS capsule Take 0.4 mg by mouth 2 (two) times daily.     vitamin B-12 (CYANOCOBALAMIN) 500 MCG tablet Take 500 mcg by mouth daily.     No current facility-administered medications for this visit.    REVIEW OF SYSTEMS:   .10 Point review of Systems was done is negative except as noted above.  PHYSICAL EXAMINATION: ECOG PERFORMANCE STATUS: 1 - Symptomatic but completely ambulatory  Vitals:   01/01/21 1236  BP: 109/64  Pulse: 77  Resp: 18  Temp: 98.9 F (37.2 C)  SpO2: 97%   Filed Weights   01/01/21 1236  Weight: 196 lb 4.8 oz (89 kg)   .Body mass index is 28.17 kg/m.   Exam was given in a chair. . GENERAMarland Kitchen:alert, in no acute distress and comfortable SKIN: no acute rashes, no significant lesions EYES: conjunctiva are pink and non-injected, sclera anicteric OROPHARYNX: MMM, no exudates, no oropharyngeal erythema or ulceration NECK: supple, no JVD LYMPH:  no  palpable lymphadenopathy in the cervical, axillary or inguinal regions LUNGS: clear to auscultation b/l with normal respiratory effort HEART: regular rate & rhythm ABDOMEN:  normoactive bowel sounds , non tender, not distended. Extremity: no pedal edema PSYCH: alert & oriented x 3 with fluent speech NEURO: no focal motor/sensory deficits   LABORATORY DATA:  I have reviewed the data as listed  . CBC Latest Ref Rng & Units 12/25/2020 11/03/2020 09/24/2020  WBC 4.0 - 10.5 K/uL 9.4 11.6(H) 13.8(H)  Hemoglobin 13.0 - 17.0 g/dL 15.1 15.8 14.9  Hematocrit 39.0 - 52.0 % 43.7 47.9 44.0  Platelets 150 - 400 K/uL 141(L) 126(L) 338    CMP Latest Ref Rng & Units 12/25/2020 11/03/2020 09/24/2020  Glucose 70 - 99 mg/dL 199(H) 118(H) 113(H)  BUN 8 - 23 mg/dL 24(H) 36(H) 36(H)  Creatinine 0.61 - 1.24 mg/dL 1.35(H) 1.29(H) 1.29(H)  Sodium 135 - 145 mmol/L 145 139 142  Potassium 3.5 - 5.1 mmol/L 3.7 4.4 4.3  Chloride 98 - 111  mmol/L 104 98 101  CO2 22 - 32 mmol/L '29 25 24  ' Calcium 8.9 - 10.3 mg/dL 9.4 9.0 9.5  Total Protein 6.5 - 8.1 g/dL 6.7 6.3 7.3  Total Bilirubin 0.3 - 1.2 mg/dL 0.9 1.0 0.5  Alkaline Phos 38 - 126 U/L 73 77 212(H)  AST 15 - 41 U/L 48(H) 30 53(H)  ALT 0 - 44 U/L 94(H) 58(H) 88(H)        07/09/2019 Prostate Patholgy Report ([id number]):    RADIOGRAPHIC STUDIES: I have personally reviewed the radiological images as listed and agreed with the findings in the report. CT ABDOMEN PELVIS W CONTRAST  Result Date: 12/18/2020 CLINICAL DATA:  Right lower quadrant pain EXAM: CT ABDOMEN AND PELVIS WITH CONTRAST TECHNIQUE: Multidetector CT imaging of the abdomen and pelvis was performed using the standard protocol following bolus administration of intravenous contrast. CONTRAST:  123m ISOVUE-300 IOPAMIDOL (ISOVUE-300) INJECTION 61% COMPARISON:  CT abdomen/pelvis 11/27/2020, CT chest 10/29/2020 FINDINGS: Lower chest: There is scarring/fibrotic change in the lung bases, similar to the prior study. There is no focal consolidation or pleural effusion. Hepatobiliary: The liver is diffusely hypoenhancing, again consistent with fatty infiltration. There are no focal lesions. The gallbladder is unremarkable. There is no biliary ductal dilatation. Pancreas: The pancreatic head demonstrates fatty atrophy. There is no focal lesion or contour abnormality. There is no main pancreatic ductal dilatation or peripancreatic inflammatory change. Spleen: Normal. Adrenals/Urinary Tract: The adrenals are unremarkable. There is no focal renal lesion. There is no stone. There is no hydronephrosis or hydroureter. Perinephric stranding is unchanged, nonspecific. There is mild fullness of the left ureter distally with no obstructing lesion, likely physiologic. Stomach/Bowel: The stomach is decompressed but grossly unremarkable. There is no evidence of bowel obstruction. There is extensive sigmoid colonic diverticulosis without evidence  of acute diverticulitis. The appendix is normal. Vascular/Lymphatic: There is extensive calcified atherosclerotic plaque in the abdominal aorta. There is focal aneurysmal dilation of the infrarenal abdominal aorta measuring up to 3.0 cm, unchanged since 2019. The main portal and splenic veins are patent. Reproductive: Radiation beads are again seen in the prostate. The seminal vesicles are unremarkable. Other: There is no ascites or free air. Musculoskeletal: Deformity of the left iliac wing is unchanged, possibly related to prior trauma. There is degenerative change of the lumbar spine with mild compression deformity of the L1 vertebral body, similar to 2019. There is no acute osseous abnormality or aggressive osseous lesion. IMPRESSION: 1. No acute findings in the abdomen or  pelvis. 2. Extensive colonic diverticulosis, unchanged. No evidence of acute diverticulitis. 3. Hepatic steatosis. 4. Focal aneurysmal dilation of the infrarenal abdominal aorta measuring up to 3.0 cm, unchanged since 2019. Recommend follow-up ultrasound every 3 years. This recommendation follows ACR consensus guidelines: White Paper of the ACR Incidental Findings Committee II on Vascular Findings. J Am Coll Radiol 2013; 71:062-694. 5.  Aortic Atherosclerosis (ICD10-I70.0). Electronically Signed   By: Valetta Mole M.D.   On: 12/18/2020 14:19    Result status: Edited Result - FINAL                               *[place] [employer].                        [place],  [address]                            [phone]   ------------------------------------------------------------------- Transthoracic Echocardiography   (Report amended )   Patient:    Alex [name], Alex [name] MR #:       [id number] Study Date: 08/24/2016 Gender:     M Age:        73 Height:     180.3 cm Weight:     90.6 kg BSA:        2.15 m^2 Pt. Status: Room:    ATTENDING     Darlina Guys, MD  Willia Craze, Christopher  REFERRING    [place], [place], Outpatient  SONOGRAPHER  Roseanna Rainbow   cc:   ------------------------------------------------------------------- LV EF: 65% -   70%   ------------------------------------------------------------------- Indications:      Aortic stenosis 424.1.   ------------------------------------------------------------------- History:   PMH:  Amyloidosis.  Coronary artery disease.  Angina pectoris.  Risk factors:  Hypertension. Dyslipidemia.   ------------------------------------------------------------------- Study Conclusions   - Left ventricle: The cavity size was normal. Wall thickness was   increased in a pattern of mild LVH. Systolic function was   vigorous. The estimated ejection fraction was in the range of 65%   to 70%. Wall motion was normal; there were no regional wall   motion abnormalities. Doppler parameters are consistent with   abnormal left ventricular relaxation (grade 1 diastolic   dysfunction). - Aortic valve: Valve mobility was mildly restricted. - Aortic root: The aortic root was mildly dilated. - Mitral valve: Calcified annulus.   Impressions:   - Vigorous LV systolic function; mild diastolic dysfunction; mild   LVH; calcified aortic valve with no significant AS (peak velocity   2 m/s; mean gradient 9 mmHg); mildly dilated aortic root.    ASSESSMENT & PLAN:   80 y.o.  male with above-mentioned multiple medical comorbidities with  #1 Biopsy proven Renal and Systemic AL Amyloidosis Initial BM Bx shows 9% plasma cells and amyloid deposits. Echo shows normal systolic function with only grade 1 diastolic dysfunction. Skeletal survey with no lytic lesions suggestive of multiple myeloma. Serum K/L ratio increased at about 6.22 on diagnosis and has now improved to 1.5 (wnl with normal kappa lambda serum free light  chain ratio ) UPEP shows 24h protein improved  from 15g to 5g daily 05/29/18 24hour UPEP no M spike   01/12/18 ECHO revealed LV EF of 60-65%   #2  Nephrotic Syndrome - likely related to AL amyloidosis.  Urine Study 07/26/17 showed Protein at 2.232g.  -07/19/17 the pt had a BM Bx exam which revealed Normocellular bone marrow (30-40%) with 2-3% plasma cells and focal amyloid deposition. UPEP 05/29/2018 - showed no M spike and total urinary protein of 12m/24h -- resolution of nephrotic syndrome.  #3 Prostatic Adenocarcinoma - Gleason Score 3+4=7  PLAN: -Discussed pt labwork, 12/25/2020; m-protein still not detected, kidney numbers improved, other chemistries stable, light chains stable, counts normal. -Recommended pt connect with PCP as he may want to continue Prednisone taper longer than one week to treat the Giant Cell Arteritis. -Will not restart maintenance treatment at this time- given covid lung and GCA are his medical priorities at this time. -Continue 50K IU Vitamin D weekly. Goal Vitamin D is 60-90. -Continue Baby ASA. -Will see back in 4 months. Labs 1 week prior.    #4 Patient Active Problem List   Diagnosis Date Noted   Temporal arteritis ([place] 09/24/2020   Sedimentation rate elevation 09/15/2020   Headache 09/15/2020   Malignant neoplasm of prostate ([place] 08/06/2019   Pneumonia due to COVID-19 virus 10/29/2018   Acute respiratory failure with hypoxia ([facility] 10/29/2018   Thrombocytopenia ([place] 10/29/2018   Left lower lobe pneumonia 04/22/2018   Sepsis due to pneumonia ([facility] 04/22/2018   Engraftment syndrome following stem cell transplantation ([facility] 04/27/2017   H/O autologous stem cell transplant ([place] 04/10/2017   Aortic stenosis 03/08/2017   Hearing loss 03/08/2017   OSA (obstructive sleep apnea) 03/08/2017   Parotid mass 02/23/2017   Dysfunction of right eustachian tube 02/06/2017   Presbycusis of both ears 146/50/3546  Folliculitis 056/81/2751  AL amyloidosis ([place] 070/04/7492  Umbilical hernia 049/67/5916   DIZZINESS 02/19/2010   CAD, NATIVE VESSEL 07/21/2009   CHEST PAIN-UNSPECIFIED 07/21/2009   HYPERLIPIDEMIA 04/30/2007   Essential hypertension 04/30/2007   GASTROESOPHAGEAL REFLUX DISEASE 04/30/2007   SEIZURE DISORDER 04/30/2007   COMPRESSION FRACTURE, L1 VERTEBRA 04/30/2007   DIVERTICULOSIS, COLON, HX OF 04/30/2007   Other postprocedural status(V45.89) 04/30/2007  -Continue follow-up with primary care physician for management of other medical co-morbidities. -Recommend if pt experiences spike fever to report to hospital with transplant team    FOLLOW UP: RTC w Dr KIrene Limboin 5 months. Labs 1 week prior to visit.   . The total time spent in the appointment was 20 minutes and more than 50% was on counseling and direct patient cares.   All of the patient's questions were answered with apparent satisfaction. The patient knows to call the clinic with any problems, questions or concerns.   GSullivan LoneMD MCovingtonAAHIVMS SSummit Park Hospital & Nursing Care CenterCEye Surgery Center Of ArizonaHematology/Oncology Physician [facility]all [facility] (Office):       [phone](Work cell):  [phone](Fax):           [phone]

## 2021-02-04 ENCOUNTER — Telehealth: Payer: Self-pay | Admitting: Cardiovascular Disease

## 2021-02-04 NOTE — Telephone Encounter (Signed)
Pt c/o of Chest Pain: STAT if CP now or developed within 24 hours  1. Are you having CP right now?  NO BUT HE HAD THE CP A FEW MINUTES AGO  2. Are you experiencing any other symptoms (ex. SOB, nausea, vomiting, sweating)?  PT HAS SOB ALL THE TIME DUE TO DOUBLE PNEUMONIA   3. How long have you been experiencing CP? 1 WEEK  4. Is your CP continuous or coming and going? COMING AND GOING  5. Have you taken Nitroglycerin? PT HAVE NOT TAKEN ANY NITRO BECAUSE THE CP GOES AWAY AFTER A FEW MINUTES ?

## 2021-02-04 NOTE — Telephone Encounter (Signed)
Spoke with Alex [name] in regards to CP.  He reports CP has come and gone for over a week.  He describes pain as dull in the center of his chest and can last for up to an hour; occurring mostly in the a.m. when it wakes him from sleep.  He denies nausea, arm pain, does c/o pain between shoulder blades and denies pain with deep breath.  He does not take Nitroglycerin when pain occurs. BP today 121/82.  He has a hx of GERD reports he takes pantoprazole as ordered.  He sees a lung specialist last OV 3-4 months ago.  He can not recall if he had Stress Test through [place] that was scheduled for May.  I advised him to take NTG as ordered if/ when CP occurs if med does not help to call 911.  I have scheduled him to see DOD Dr. Tamala Julian on 02/08/21.  He verbalizes understanding.

## 2021-02-08 ENCOUNTER — Encounter: Payer: Self-pay | Admitting: Interventional Cardiology

## 2021-02-08 ENCOUNTER — Ambulatory Visit: Payer: Medicare Other | Admitting: Interventional Cardiology

## 2021-02-08 ENCOUNTER — Other Ambulatory Visit: Payer: Self-pay

## 2021-02-08 VITALS — BP 122/88 | HR 76 | Ht 70.0 in | Wt 202.2 lb

## 2021-02-08 DIAGNOSIS — E7800 Pure hypercholesterolemia, unspecified: Secondary | ICD-10-CM

## 2021-02-08 DIAGNOSIS — R079 Chest pain, unspecified: Secondary | ICD-10-CM | POA: Diagnosis not present

## 2021-02-08 DIAGNOSIS — E8581 Light chain (AL) amyloidosis: Secondary | ICD-10-CM

## 2021-02-08 DIAGNOSIS — I350 Nonrheumatic aortic (valve) stenosis: Secondary | ICD-10-CM

## 2021-02-08 DIAGNOSIS — I25118 Atherosclerotic heart disease of native coronary artery with other forms of angina pectoris: Secondary | ICD-10-CM | POA: Diagnosis not present

## 2021-02-08 DIAGNOSIS — G4733 Obstructive sleep apnea (adult) (pediatric): Secondary | ICD-10-CM

## 2021-02-08 DIAGNOSIS — I10 Essential (primary) hypertension: Secondary | ICD-10-CM

## 2021-02-08 NOTE — Progress Notes (Signed)
Cardiology Office Note:    Date:  02/08/2021   ID:  Alex Adler., DOB 18-Apr-1941, MRN [id number]  PCP:  Ginger Organ., MD  Cardiologist:  [name], MD   Referring MD: Ginger Organ., MD   Chief Complaint  Patient presents with   Coronary Artery Disease   Chest Pain     History of Present Illness:    Alex Gens. is a 80 y.o. male with a hx of AL amyloidosis(2018) with nephrotic syndrome treated with chemo and stem cell transplant, HTN, moderate non-obstructive CAD by cath 2011, aortic stenosis, GERD, hyperlipidemia, carotid artery disease, Covid -19 (12/3788) complicated by pneumomediastinum and PE, OSA, pulmonary fibrosis, and CKD III.  Being seen for 3 weeks of recurring CP.  Describes the discomfort as From the left upper midclavicular line to the left of the midclavicular line.  The discomfort is described as a tightness.  It is only present in the a.m. when Alex [name] awakens.  Alex [name] wonders if CPAP has anything to do with it.  Alex [name] has only been back on CPAP for approximately a month.  This started occurring right after Alex [name] began wearing CPAP again.  Alex [name] has had a similar type discomfort over the years.  Alex [name] states that different work-ups have been done by his doctors and they could not figure out if it is coming from his heart or his lungs.  With the above-noted complaints, Alex [name] has tried sublingual nitroglycerin but noted no improvement in the discomfort.  Despite multiple prolonged episodes of chest discomfort, EKG remains unchanged.  Past Medical History:  Diagnosis Date   AL amyloidosis [facility]) oncologist-- dr [name]   Renal and Systemic , dx 04/ 2018;  renal bx 08-05-2016 and bone marrow bx 08-29-2016;  8 month chemotherapy then stell cell transplant 12/ 2018 then chemo again;  since then been on oral chemo daily (11-07-2019 per pt has been on hold until after prostate seed implants)   Arthritis    Atypical chest pain    CAD (coronary artery disease)  cardiologist-- dr [name];  also followed by cardiologist w/ [facility] in [place], dr [name]   moderate nonobstrucive disease by cath 2011; Lexiscan Myoview 6/14--Normal study, no scar or ischemia, EF 62%;  11-14-2014 nuclear study low risk w/ normal perfusion, nuclear ef 67%   CKD (chronic kidney disease), stage III (HCC)    COVID    b/l PNA (7/20)- 21 day hospitalization tx with steroids, Remdesevir, Actemra, Hydroxychloroquine) --> PE on Eliquis.   Diverticulosis of colon    Dyspnea    WITH EXERTION    ED (erectile dysfunction)    GERD (gastroesophageal reflux disease)    H/O stem cell transplant ([place]) 03/2017   Headache    Heart murmur    Hiatal hernia    History of 2019 novel coronavirus disease (COVID-19) 09/2018   [facility], hospital admission 10-28-2019 respiratory failure/ pneumometrostinom/ PE ;  was not intubated , treatment with remdesivir/ steroids/ actemra/ plasma   History of basal cell carcinoma (BCC)    History of colon polyps    History of MRSA infection    culture positive   History of pulmonary embolus (PE) 10/2018   with covid 19, completed treatment with eliquis   History of seizures    11-07-2019  per pt last one 1970s, unknown cause   History of vertebral compression fracture 1988   lumbar   Hx of adenomatous colonic polyps  negative in 7/08   Hyperlipidemia    Hypertension    IFG (impaired fasting glucose)    Neuropathy due to chemotherapeutic drug (HCC)    fingers tips and feet   Nocturia    On supplemental oxygen therapy    post covid 19;   11-07-2019 pt uses O2 every night 2L via [place],  and uses supplements during the day when walking around neighborhood and goes out to appointments/ store that Alex [name] has to walk a distance   OSA (obstructive sleep apnea)    study in epic 03-18-2005 mild to moderate   (11-07-2019  per pt used cpap up until when Alex [name] lost alot of weight, stoped approx. 2012   Prostate cancer [facility]) urologist--- dr  pace/  oncology-- dr Tammi Klippel   dx 03/ 2021   Pulmonary fibrosis ([place]) 09/2018   pulmonologist-- dr Vaughan Browner;   post covid 19   Seizure ([place])    1st in 1976; last in 1978 GTC x 2    SOB (shortness of breath) on exertion    11-07-2019  per pt walks around neighborhood for 30 minutes twice daily, Alex [name] does have to stop frequently d/t sob and since weather has been hot Alex [name] uses his supplemental oxygen;  stated Alex [name] has a pulse ox. at home , when Alex [name] is sitting down O2 sat 96 -97% on RA when up walking around the house O2 sat 93-94% on RA;  pt stated Alex [name] carry's his O2 w/ him when Alex [name] goes to doctor appts/store d/t distance   Wears glasses    Wears hearing aid in both ears     Past Surgical History:  Procedure Laterality Date   ARTERY BIOPSY Right 09/17/2020   Procedure: RIGHT TEMPORAL ARTERY BIOPSY;  Surgeon: Armandina Gemma, MD;  Location: [facility];  Service: General;  Laterality: Right;   CARDIAC CATHETERIZATION  05-21-1999  and 12-29-2000  @[facility]   dr Maryjo [place] nuclear study;  moderate nonobstructive disease involving LAD and LCx;  second done due to in Reversal research trial   CARDIAC CATHETERIZATION  07/07/2009  dr [name]   nonobstructive disease w/ normal LVF   [place]   C2 -- 4   CYSTOSCOPY N/A 11/13/2019   Procedure: CYSTOSCOPY FLEXIBLE;  Surgeon: [name], MD;  Location: [place];  Service: Urology;  Laterality: N/A;  no seeds detected per Dr. Claudia Desanctis   FOOT SURGERY Bilateral x3 last one 1980s   bunionectomy great toe--- x2 right and x1 left   KNEE ARTHROSCOPY Left 1996   NASAL SEPTUM SURGERY  x3 last one 08-09-2010 @ [place]   PAROTIDECTOMY Right 03/09/2017   RADIOACTIVE SEED IMPLANT N/A 11/13/2019   Procedure: RADIOACTIVE SEED IMPLANT/BRACHYTHERAPY IMPLANT;  Surgeon: [name], MD;  Location: [place];  Service: Urology;  Laterality: N/A;  ONLY NEEDS 90 MIN FOR ALL PROCEDURES   ROTATOR CUFF REPAIR  Bilateral x1 rigth (unsure date);  x2 left last one 09-07-2009 @[facility] ;  right   SPACE OAR INSTILLATION N/A 11/13/2019   Procedure: SPACE OAR INSTILLATION;  Surgeon: [name], MD;  Location: [facility];  Service: Urology;  Laterality: N/A;   TRIGGER FINGER RELEASE Bilateral x2  last one 2001 approx.   x1 finger left hand;  x2 fingers right hand   UMBILICAL HERNIA REPAIR  01/19/2011   VASECTOMY  approx. 1968 (with anesthesia)    Current Medications: Current Meds  Medication Sig  acyclovir (ZOVIRAX) 200 MG capsule Take 200 mg by mouth 2 (two) times daily.   apixaban (ELIQUIS) 5 MG TABS tablet 5 mg in the morning and at bedtime.   Ascorbic Acid (VITAMIN C) 1000 MG tablet Take 1,000 mg by mouth 2 (two) times daily.   atorvastatin (LIPITOR) 40 MG tablet Take 40 mg by mouth at bedtime.    b complex vitamins capsule Take 1 capsule by mouth daily.   carvedilol (COREG) 6.25 MG tablet Take 6.25 mg by mouth every morning.   Cholecalciferol 25 MCG (1000 UT) tablet Take 1,000 Units by mouth 2 (two) times a day.    ezetimibe (ZETIA) 10 MG tablet Take 1 tablet by mouth daily.   fluticasone (FLONASE) 50 MCG/ACT nasal spray Place 2 sprays into both nostrils daily.   folic acid (FOLVITE) 1 MG tablet Take 1 mg by mouth at bedtime.    furosemide (LASIX) 40 MG tablet Take 40 mg by mouth daily.    Ipratropium-Albuterol (COMBIVENT RESPIMAT) 20-100 MCG/ACT AERS respimat Inhale 1 puff into the lungs every 6 (six) hours as needed for wheezing or shortness of breath.   isosorbide mononitrate (IMDUR) 15 mg TB24 24 hr tablet Take 15 mg by mouth daily.   Lancets (ONETOUCH DELICA PLUS URKYHC62B) MISC SMARTSIG:Via Meter 2-4 Times Daily   Multiple Vitamin (MULTI-VITAMINS) TABS Take 1 tablet by mouth at bedtime.   mupirocin ointment (BACTROBAN) 2 % Apply to skin lesion TID   nitroGLYCERIN (NITROSTAT) 0.4 MG SL tablet DISSOLVE ONE TABLET UNDER TONGUE EVERY 5 MINUTES AS NEEDED UP TO 3 DOSES, IF NORELIEF  CALL 911   ONETOUCH VERIO test strip SMARTSIG:Via Meter 2-4 Times Daily   pantoprazole (PROTONIX) 40 MG tablet Take 40 mg by mouth daily.    potassium chloride SA (KLOR-CON) 20 MEQ tablet Take 20 mEq by mouth daily.   predniSONE (DELTASONE) 5 MG tablet 35mg , 30mg  (Start Monday 7/18), 25mg  (start 8/1), 20mg  (start 8/15), 17.5mg  (8/29), 15mg  (9/12), 12.5mg  (9/26) then 10mg  and hold   tamsulosin (FLOMAX) 0.4 MG CAPS capsule Take 0.4 mg by mouth 2 (two) times daily.   vitamin B-12 (CYANOCOBALAMIN) 500 MCG tablet Take 500 mcg by mouth daily.     Allergies:   Ramipril   Social History   Socioeconomic History   Marital status: Married    Spouse name: Not on file   Number of children: 2   Years of education: 12   Highest education level: Not on file  Occupational History   Occupation: Retired  Tobacco Use   Smoking status: Former    Packs/day: 3.00    Years: 20.00    Pack years: 60.00    Types: Cigarettes    Quit date: 04/25/1969    Years since quitting: 51.8   Smokeless tobacco: Former    Types: Chew    Quit date: 11/07/1983  Vaping Use   Vaping Use: Never used  Substance and Sexual Activity   Alcohol use: Yes    Comment: RARE   Drug use: Never   Sexual activity: Not Currently    Comment: SHIM 1. Reports erectile dysfunction.;    Other Topics Concern   Not on file  Social History Narrative   ** Merged History Encounter **   Lives at home w/ his wife   Right-handed   Caffeine: Dr Malachi Bonds 16 oz/week   Social Determinants of Health   Financial Resource Strain: Not on file  Food Insecurity: Not on file  Transportation Needs: Not on file  Physical  Activity: Not on file  Stress: Not on file  Social Connections: Not on file     Family History: The patient's family history includes Cancer in his sister; Diabetes in his father; Eczema in his sister; Hearing loss in his mother; Heart attack (age of onset: 72) in his father; Heart disease in his father; Hyperlipidemia in an other  family member; Hypertension in his father and sister; Seizures in an other family member; Thyroid disease in his sister. There is no history of Colon cancer, Stroke, Breast cancer, Pancreatic cancer, or Prostate cancer.  ROS:   Please see the history of present illness.    Alex [name] has AL amyloidosis.  Alex [name] underwent a stem cell transplant and 2 rounds of chemotherapy 1 before and 1 after the stem cell transplant.  Not currently on chemo.  The most dramatic recent illness is COVID-19 done was very severe and associated with multiple complications.  They include a pulmonary emboli and pneumomediastinum.  All other systems reviewed and are negative.  EKGs/Labs/Other Studies Reviewed:    The following studies were reviewed today:  2D Doppler echocardiogram 06/05/2020: IMPRESSIONS     1. Left ventricular ejection fraction, by estimation, is 60 to 65%. The  left ventricle has normal function. The left ventricle has no regional  wall motion abnormalities. There is mild concentric left ventricular  hypertrophy. Left ventricular diastolic  parameters are consistent with Grade I diastolic dysfunction (impaired  relaxation).   2. Right ventricular systolic function is normal. The right ventricular  size is normal.   3. The mitral valve is normal in structure. No evidence of mitral valve  regurgitation. No evidence of mitral stenosis.   4. The aortic valve is calcified. There is mild calcification of the  aortic valve. There is mild thickening of the aortic valve. Aortic valve  regurgitation is trivial. Moderate aortic valve stenosis. Aortic valve  area, by VTI measures 1.19 cm. Aortic  valve mean gradient measures 17.0 mmHg. Aortic valve Vmax measures 2.83  m/s.   5. Aortic dilatation noted. There is mild dilatation of the aortic root,  measuring 38 mm. There is mild dilatation of the ascending aorta,  measuring 37 mm.   6. The inferior vena cava is normal in size with greater than 50%  respiratory  variability, suggesting right atrial pressure of 3 mmHg.   7. Compared to prior echo AS appears to have progressed. The AVA (VTI)  has decreased from 1.5 to 1.19cm2, P/M transaortic gradients have  increased fro 20/11 to 32/63mmHg, DI has decreased from 0.35 to 0.24. The  stroke volume index is low at 32. Although   the mean AVG is more consistent with mild AS, the dimensionless index is  low and in setting of low SV this may represent low flow low gradient  aortic stenosis that is at least moderatein severity.   EKG:  EKG performed 02/08/2021 demonstrates normal sinus rhythm, left axis deviation, poor R wave progression V1 through V6.  When compared to the prior tracing performed in May 2022, no changes noted.  Recent Labs: 12/25/2020: ALT 94; BUN 24; Creatinine 1.35; Hemoglobin 15.1; Platelets 141; Potassium 3.7; Sodium 145  Recent Lipid Panel    Component Value Date/Time   CHOL  07/06/2009 0552    148        ATP III CLASSIFICATION:  <200     mg/dL   Desirable  200-239  mg/dL   Borderline High  >=240    mg/dL   High  TRIG 103 10/29/2018 0028   HDL 50 07/06/2009 0552   CHOLHDL 3.0 07/06/2009 0552   VLDL 19 07/06/2009 0552   LDLCALC  07/06/2009 0552    79        Total Cholesterol/HDL:CHD Risk Coronary Heart Disease Risk Table                     Men   Women  1/2 Average Risk   3.4   3.3  Average Risk       5.0   4.4  2 X Average Risk   9.6   7.1  3 X Average Risk  23.4   11.0        Use the calculated Patient Ratio above and the CHD Risk Table to determine the patient's CHD Risk.        ATP III CLASSIFICATION (LDL):  <100     mg/dL   Optimal  100-129  mg/dL   Near or Above                    Optimal  130-159  mg/dL   Borderline  160-189  mg/dL   High  >190     mg/dL   Very High    Physical Exam:    VS:  BP 122/88   Pulse 76   Ht 5\' 10"  (1.778 m)   Wt 202 lb 3.2 oz (91.7 kg)   SpO2 96%   BMI 29.01 kg/m     Wt Readings from Last 3 Encounters:  02/08/21  202 lb 3.2 oz (91.7 kg)  01/01/21 196 lb 4.8 oz (89 kg)  11/03/20 187 lb (84.8 kg)     GEN: Compatible with age. No acute distress HEENT: Normal NECK: No JVD. LYMPHATICS: No lymphadenopathy CARDIAC: 2/6 systolic crescendo decrescendo murmur.  Murmur. RRR no gallop, or edema. VASCULAR:  Normal Pulses. No bruits. RESPIRATORY: Decreased breath sounds bilaterally without wheezing or rhonchi. ABDOMEN: Soft, non-tender, non-distended, No pulsatile mass, MUSCULOSKELETAL: No deformity  SKIN: Warm and dry NEUROLOGIC:  Alert and oriented x 3 PSYCHIATRIC:  Normal affect   ASSESSMENT:    1. Chest pain of uncertain etiology   2. Nonrheumatic aortic valve stenosis   3. Coronary artery disease of native artery of native heart with stable angina pectoris ([place])   4. Pure hypercholesterolemia   5. Essential hypertension   6. AL amyloidosis ([place])   7. OSA (obstructive sleep apnea)    PLAN:    In order of problems listed above:  Uncertain etiology.  Clinical observation.  Use nitroglycerin if it seems to help.  On the 1 occasion that Alex [name] use it it did not help.  My clinical suspicion is that this is not cardiac as it is not exertion related, EKG is not changed after having the multiple episodes of prolonged discomfort. Systolic murmur is heard. Nonobstructive disease noted on prior evaluation. Continue Zetia and Lipitor Blood pressure is controlled on current regimen which includes carvedilol, furosemide, and low-salt diet. Noted Wearing CPAP with current episodes of chest pain correlating with initiation of CPAP 1 month ago.   Reassured the patient.  Alex [name] should call if symptoms continue or worsen.  Alex [name] is a chronicity to this quality of chest pain, having previously had it some years ago without ever finding the explanation.   Medication Adjustments/Labs and Tests Ordered: Current medicines are reviewed at length with the patient today.  Concerns regarding medicines are outlined above.   Orders Placed This Encounter  Procedures   EKG 12-Lead    No orders of the defined types were placed in this encounter.   Patient Instructions  Medication Instructions:  Your physician recommends that you continue on your current medications as directed. Please refer to the Current Medication list given to you today.  *If you need a refill on your cardiac medications before your next appointment, please call your pharmacy*   Lab Work: None If you have labs (blood work) drawn today and your tests are completely normal, you will receive your results only by: [place] (if you have MyChart) OR A paper copy in the mail If you have any lab test that is abnormal or we need to change your treatment, we will call you to review the results.   Testing/Procedures: None   Follow-Up: Follow up as planned with Dr. [name]   Other Instructions     Signed, Sinclair Grooms, MD  02/08/2021 4:49 PM    [place]

## 2021-02-08 NOTE — Patient Instructions (Signed)
Medication Instructions:  Your physician recommends that you continue on your current medications as directed. Please refer to the Current Medication list given to you today.  *If you need a refill on your cardiac medications before your next appointment, please call your pharmacy*   Lab Work: None If you have labs (blood work) drawn today and your tests are completely normal, you will receive your results only by: [place] (if you have MyChart) OR A paper copy in the mail If you have any lab test that is abnormal or we need to change your treatment, we will call you to review the results.   Testing/Procedures: None   Follow-Up: Follow up as planned with Dr. [name]   Other Instructions

## 2021-02-09 NOTE — Progress Notes (Signed)
[facility]    Provider:  Dr Jaynee Eagles Requesting Provider: Ginger Organ., MD Primary Care Provider:  Ginger Organ., MD  CC:  Steroids  02/10/2021: They are also going to Dr. [name] for the temporal arteritis. Will call Dr. [name]. Patient is having a difficult time with his steroids, gaining weight. We discussed whether to go faster or slower, discussed benefits vs risk of recurrence of temporal arteritis. He is on 60m, I would prefer to decrease by no more than 2.538meach month. Also reached out to Dr. BeAmil Amennd Dr. ShBrigitte Pulseo discuss tapering scheduling, patient wants to go faster and states all three physicians have stated differently.    11/03/2020: Discussed the following   He is on 3553mf steroids, he is not watching his glucose, I will call Dr. ShaBrigitte Pulseiscussed he will be on steroids for some time and we need to manage his glucose.   - gradually reduce the dose by 5 mg every 2 weeks to 20 mg. - When patient is at 20 mg reduce the dose by 2.5 mg every 2 weeks to 10 mg and hold and come for follow up.   You are on 61m3mily of steroids.  61mg47m 2 weeks 30mg 1m2 weeks (Start Monday 7/18) 25mg f8m weeks (start 8/1) 20mg fo62mweeks (start 8/15) 17.5 for 2 weeks (8/29) 15 for 2 weeks (9/12) 12.5 for 2 weeks (9/26) 10mg (1060m2022) and after this we may decrease by 1mg or 2.42m every 34mth until done  Will reach out to Dr. Shaw about Brigitte Pulseing him some diabetic education and maybe metformin.   HPI:  Alex O CKristian [name].o. [email or web address] as requested by Shaw, WilliGinger Organeadaches.  Past medical history [facility], hyperlipidemia, COVID bilateral pneumonia, hypertension, seizure disorder last in 1978 generalized tonic-clonic type II, GERD, OSA on BiPAP, pulmonary fibrosis and chronic respiratory failure with hypoxia discontinuing his CPAP recently due to his  headache, right parotid surgery, former smoker stopped in 1971 started in 1956 but was very heavy 40+ a day, 2-3 alcoholic drinks a week.   I reviewed Dr. Shah's offiTrena Plattes: Sed rate was 60 and a trial of steroids was provided.  He reports persistent constant headaches like squeezing over the sides of the head, steady pressure with sudden jolt of pain, MRI was normal, steroids helped the first day or 2 but then returned, muscle relaxants have not helped, history of cluster headaches in the 1990s which were constant, no other headache pattern since then.  Temporary improvement with steroids.  Normal MRI.  They proceeded with general surgery consultation to consider temporal artery biopsy.  Consider MRI of the cervical spine.  He is here with his wife, started having jaw pain, radiated to the head, worse on the right but on both sides, he had a slight fever, wife provides much information, low-grade fevers, fatigued and achy, wanted to sleep a lot he was tired, the vessels were popping out on the head and were large, MRI of the brain was negative, biopsy positive for temporal arteritis,scar is healing well,  He had a headache, he was also having double vision. Started on steroids and improving. His shoulders did not hurt. He is in remission for kidney cancer. Jaw pain is improved, fatigue, improved almost right away on the steroids. No other focal neurologic deficits, associated symptoms, inciting events or modifiable factors.  Biopsy was positive for temporal arteritis.   Reviewed notes, labs and imaging from outside physicians, which showed: Lab work 07/06/2020 showed a CMP with creatinine 1.3, BUN 20, otherwise unremarkable, CBC unremarkable slightly reduced platelets 132, TSH normal, A1c 5.4.  MRI brain 09/04/2020: IMPRESSION: No acute or subacute finding. No left occipital stroke as was suggested by CT. Reviewed images and agree  Age related volume loss. Mild chronic small-vessel ischemic  changes of the cerebral hemispheric white matter. No finding seen to explain headache.  Reviewed biopsy/pathology report: Granulomatous arteritis  Review of Systems: Patient complains of symptoms per HPI as well as the following symptoms: weight gain . Pertinent negatives and positives per HPI. All others negative    Social History   Socioeconomic History   Marital status: Married    Spouse name: Not on file   Number of children: 2   Years of education: 12   Highest education level: Not on file  Occupational History   Occupation: Retired  Tobacco Use   Smoking status: Former    Packs/day: 3.00    Years: 20.00    Pack years: 60.00    Types: Cigarettes    Quit date: 04/25/1969    Years since quitting: 51.8   Smokeless tobacco: Former    Types: Chew    Quit date: 11/07/1983  Vaping Use   Vaping Use: Never used  Substance and Sexual Activity   Alcohol use: Yes    Comment: RARE   Drug use: Never   Sexual activity: Not Currently    Comment: SHIM 1. Reports erectile dysfunction.;    Other Topics Concern   Not on file  Social History Narrative   ** Merged History Encounter **   Lives at home w/ his wife   Right-handed   Caffeine: Dr Malachi Bonds 16 oz/week   Social Determinants of Health   Financial Resource Strain: Not on file  Food Insecurity: Not on file  Transportation Needs: Not on file  Physical Activity: Not on file  Stress: Not on file  Social Connections: Not on file  Intimate Partner Violence: Not on file    Family History  Problem Relation Age of Onset   Hearing loss Mother    Diabetes Father    Heart disease Father    Heart attack Father 63   Hypertension Father    Cancer Sister        1 sister had cancer but patient unsure of type   Eczema Sister    Hypertension Sister    Thyroid disease Sister    Hyperlipidemia Other    Seizures Other    Colon cancer Neg Hx    Stroke Neg Hx    Breast cancer Neg Hx    Pancreatic cancer Neg Hx    Prostate cancer  Neg Hx     Past Medical History:  Diagnosis Date   AL amyloidosis [facility]) oncologist-- dr [name]   Renal and Systemic , dx 04/ 2018;  renal bx 08-05-2016 and bone marrow bx 08-29-2016;  8 month chemotherapy then stell cell transplant 12/ 2018 then chemo again;  since then been on oral chemo daily (11-07-2019 per pt has been on hold until after prostate seed implants)   Arthritis    Atypical chest pain    CAD (coronary artery disease) cardiologist-- dr [name];  [facility] [place] dr [name]   moderate nonobstrucive disease by cath 2011; Lexiscan Myoview 6/14--Normal study, no scar or ischemia, EF 62%;  11-14-2014 nuclear study low risk w/ normal perfusion, nuclear ef 67%   CKD (chronic kidney disease), stage III (HCC)    COVID    b/l PNA (7/20)- 21 day hospitalization tx with steroids, Remdesevir, Actemra, Hydroxychloroquine) --> PE on Eliquis.   Diverticulosis of colon    Dyspnea    WITH EXERTION    ED (erectile dysfunction)    GERD (gastroesophageal reflux disease)    H/O stem cell transplant ([place]) 03/2017   Headache    Heart murmur    Hiatal hernia    History of 2019 novel coronavirus disease (COVID-19) 09/2018   [facility], hospital admission 10-28-2019 respiratory failure/ pneumometrostinom/ PE ;  was not intubated , treatment with remdesivir/ steroids/ actemra/ plasma   History of basal cell carcinoma (BCC)    History of colon polyps    History of MRSA infection    culture positive   History of pulmonary embolus (PE) 10/2018   with covid 19, completed treatment with eliquis   History of seizures    11-07-2019  per pt last one 1970s, unknown cause   History of vertebral compression fracture 1988   lumbar   Hx of adenomatous colonic polyps    negative in 7/08   Hyperlipidemia    Hypertension    IFG (impaired fasting glucose)    Neuropathy due to chemotherapeutic drug ([place])    fingers tips and feet   Nocturia    On  supplemental oxygen therapy    post covid 19;   11-07-2019 pt uses O2 every night 2L via [place],  and uses supplements during the day when walking around neighborhood and goes out to appointments/ store that he has to walk a distance   OSA (obstructive sleep apnea)    study in epic 03-18-2005 mild to moderate   (11-07-2019  per pt used cpap up until when he lost alot of weight, stoped approx. 2012   Prostate cancer [facility]) urologist--- dr pace/  oncology-- dr Tammi Klippel   dx 03/ 2021   Pulmonary fibrosis ([place]) 09/2018   pulmonologist-- dr Vaughan Browner;   post covid 19   Seizure ([place])    1st in 1976; last in 1978 GTC x 2    SOB (shortness of breath) on exertion    11-07-2019  per pt walks around neighborhood for 30 minutes twice daily, he does have to stop frequently d/t sob and since weather has been hot he uses his supplemental oxygen;  stated he has a pulse ox. at home , when he is sitting down O2 sat 96 -97% on RA when up walking around the house O2 sat 93-94% on RA;  pt stated he carry's his O2 w/ him when he goes to doctor appts/store d/t distance   Wears glasses    Wears hearing aid in both ears     Patient Active Problem List   Diagnosis Date Noted   Temporal arteritis ([place]) 09/24/2020   Sedimentation rate elevation 09/15/2020   Headache 09/15/2020   Malignant neoplasm of prostate ([place]) 08/06/2019   Pneumonia due to COVID-19 virus 10/29/2018   Acute respiratory failure with hypoxia ([place]) 10/29/2018   Thrombocytopenia ([place]) 10/29/2018   Left lower lobe pneumonia 04/22/2018   Sepsis due to pneumonia ([place]) 04/22/2018   Engraftment syndrome following stem cell transplantation ([place]) 04/27/2017   H/O autologous stem cell transplant ([place]) 04/10/2017   Aortic stenosis 03/08/2017   Hearing loss 03/08/2017   OSA (obstructive sleep apnea) 03/08/2017   Parotid  mass 02/23/2017   Dysfunction of right eustachian tube 02/06/2017   Presbycusis of both ears 97/41/6384   Folliculitis 53/64/6803   AL  amyloidosis ([place]) 21/22/4825   Umbilical hernia 00/37/0488   DIZZINESS 02/19/2010   CAD, NATIVE VESSEL 07/21/2009   CHEST PAIN-UNSPECIFIED 07/21/2009   HYPERLIPIDEMIA 04/30/2007   Essential hypertension 04/30/2007   GASTROESOPHAGEAL REFLUX DISEASE 04/30/2007   SEIZURE DISORDER 04/30/2007   COMPRESSION FRACTURE, L1 VERTEBRA 04/30/2007   DIVERTICULOSIS, COLON, HX OF 04/30/2007   Other postprocedural status(V45.89) 04/30/2007    Past Surgical History:  Procedure Laterality Date   ARTERY BIOPSY Right 09/17/2020   Procedure: RIGHT TEMPORAL ARTERY BIOPSY;  Surgeon: Armandina Gemma, MD;  Location: [facility];  Service: General;  Laterality: Right;   CARDIAC CATHETERIZATION  05-21-1999  and 12-29-2000  '@[facility]'   dr Maryjo [place] nuclear study;  moderate nonobstructive disease involving LAD and LCx;  second done due to in Reversal research trial   CARDIAC CATHETERIZATION  07/07/2009  dr [name]   nonobstructive disease w/ normal LVF   CARPAL TUNNEL RELEASE Left [place]   C2 -- 4   CYSTOSCOPY N/A 11/13/2019   Procedure: CYSTOSCOPY FLEXIBLE;  Surgeon: [name], MD;  Location: [place];  Service: Urology;  Laterality: N/A;  no seeds detected per Dr. Claudia Desanctis   FOOT SURGERY Bilateral x3 last one 1980s   bunionectomy great toe--- x2 right and x1 left   KNEE ARTHROSCOPY Left 1996   NASAL SEPTUM SURGERY  x3 last one 08-09-2010 @ [facility]   PAROTIDECTOMY Right 03/09/2017   RADIOACTIVE SEED IMPLANT N/A 11/13/2019   Procedure: RADIOACTIVE SEED IMPLANT/BRACHYTHERAPY IMPLANT;  Surgeon: [name], MD;  Location: [place];  Service: Urology;  Laterality: N/A;  ONLY NEEDS 90 MIN FOR ALL PROCEDURES   ROTATOR CUFF REPAIR Bilateral x1 rigth (unsure date);  x2 left last one 09-07-2009 '@[facility]' ;  right   SPACE OAR INSTILLATION N/A 11/13/2019   Procedure: SPACE OAR INSTILLATION;  Surgeon: [name], MD;  Location: [facility];  Service:  Urology;  Laterality: N/A;   TRIGGER FINGER RELEASE Bilateral x2  last one 2001 approx.   x1 finger left hand;  x2 fingers right hand   UMBILICAL HERNIA REPAIR  01/19/2011   VASECTOMY  approx. 1968 (with anesthesia)    Current Outpatient Medications  Medication Sig Dispense Refill   acyclovir (ZOVIRAX) 200 MG capsule Take 200 mg by mouth 2 (two) times daily.     apixaban (ELIQUIS) 5 MG TABS tablet 5 mg in the morning and at bedtime.     Ascorbic Acid (VITAMIN C) 1000 MG tablet Take 1,000 mg by mouth 2 (two) times daily.     aspirin EC 81 MG tablet Take 81 mg by mouth daily.     atorvastatin (LIPITOR) 40 MG tablet Take 40 mg by mouth at bedtime.      b complex vitamins capsule Take 1 capsule by mouth daily.     carvedilol (COREG) 6.25 MG tablet Take 6.25 mg by mouth every morning.     Cholecalciferol 25 MCG (1000 UT) tablet Take 1,000 Units by mouth 2 (two) times a day.      ezetimibe (ZETIA) 10 MG tablet Take 1 tablet by mouth daily.     fluticasone (FLONASE) 50 MCG/ACT nasal spray Place 2 sprays into both nostrils daily. 16 g 5   folic acid (FOLVITE) 1 MG tablet Take 1 mg by mouth at bedtime.  furosemide (LASIX) 40 MG tablet Take 40 mg by mouth daily.      Ipratropium-Albuterol (COMBIVENT RESPIMAT) 20-100 MCG/ACT AERS respimat Inhale 1 puff into the lungs every 6 (six) hours as needed for wheezing or shortness of breath.     isosorbide mononitrate (IMDUR) 15 mg TB24 24 hr tablet Take 15 mg by mouth daily.     Lancets (ONETOUCH DELICA PLUS [id number]) MISC SMARTSIG:Via Meter 2-4 Times Daily     Multiple Vitamin (MULTI-VITAMINS) TABS Take 1 tablet by mouth at bedtime.     mupirocin ointment (BACTROBAN) 2 % Apply to skin lesion TID     nitroGLYCERIN (NITROSTAT) 0.4 MG SL tablet DISSOLVE ONE TABLET UNDER TONGUE EVERY 5 MINUTES AS NEEDED UP TO 3 DOSES, IF NORELIEF CALL 911 25 tablet 8   ONETOUCH VERIO test strip SMARTSIG:Via Meter 2-4 Times Daily     pantoprazole (PROTONIX) 40 MG tablet  Take 40 mg by mouth daily.      potassium chloride SA (KLOR-CON) 20 MEQ tablet Take 20 mEq by mouth daily.     predniSONE (DELTASONE) 5 MG tablet 56m, 329m(Start Monday 7/18), 2549mstart 8/1), 49m35mtart 8/15), 17.5mg 7m29), 15mg 29m2), 12.5mg (911m) then 10mg an21mld 180 tablet 3   tamsulosin (FLOMAX) 0.4 MG CAPS capsule Take 0.4 mg by mouth 2 (two) times daily.     vitamin B-12 (CYANOCOBALAMIN) 500 MCG tablet Take 500 mcg by mouth daily.     No current facility-administered medications for this visit.    Allergies as of 02/10/2021 - Review Complete 02/10/2021  Allergen Reaction Noted   Ramipril Cough 07/06/2017    Vitals: BP 115/66   Pulse 73   Ht '5\' 11"'  (1.803 m)   Wt 205 lb 6.4 oz (93.2 kg)   BMI 28.65 kg/m  Last Weight:  Wt Readings from Last 1 Encounters:  02/10/21 205 lb 6.4 oz (93.2 kg)   Last Height:   Ht Readings from Last 1 Encounters:  02/10/21 '5\' 11"'  (1.803 m)    Exam: NAD, pleasant                  Speech:    Speech is normal; fluent and spontaneous with normal comprehension.  Cognition:    The patient is oriented to person, place, and time;     recent and remote memory intact;     language fluent;    Cranial Nerves:    The pupils are equal, round, and reactive to light.Trigeminal sensation is intact and the muscles of mastication are normal. The face is symmetric. The palate elevates in the midline. Hearing intact. Voice is normal. Shoulder shrug is normal. The tongue has normal motion without fasciculations.   Coordination:  No dysmetria  Motor Observation:    No asymmetry, no atrophy, and no involuntary movements noted. Tone:    Normal muscle tone.     Strength:    Strength is V/V in the upper and lower limbs.      Sensation: intact to LT     Assessment/Plan: This is a patient with temporal arteritis.  On prednisone 10mg.  W48mll slowly decrease the prednisone and monitor his ESR and CRP.   - He is on 10mg, I w67m prefer to decrease  by no more than 2.5mg each m36mh. Also reached out to Dr. Beekman andAmil Amenaw to disBrigitte Pulse tapering scheduling, patient wants to go faster and states all three physicians have stated differently.    PRIOR Assessment and plan : 11/03/2020  You are on 38m daily of steroids.  331mfor 2 weeks 3072mor 2 weeks (Start Monday 7/18) 15m65mr 2 weeks (start 8/1) 20mg40m 2 weeks (start 8/15) 17.5 for 2 weeks (8/29) 15 for 2 weeks (9/12) 12.5 for 2 weeks (9/26) 10mg 30m10/2022) and after this we may decrease by 1mg or49m5mg eve58mmonth until done   PRIOR Assessment and plan  - 60mg for21meeks then reduced to 50 mg/day for 2 weeks then 40 mg/day for 4 weeks. Return to clinic in 6-8 weeks for check on symptoms, check on esr/crp and for further titration as below. -Check ESR and CRP monthly unless symptoms worsen then check again and adjust taper. -Subsequently gradually reduce the dose by 5 mg every 2 weeks to 20 mg -When patient is at 20 mg reduce the dose by 2.5 mg every 2 weeks to 10 mg -And patient is at 10 mg can reduce the dose by 1 mg each month -Monitor patient may be on steroids for 6 to 12 months depending on his ESR CRP and symptoms. - need to follow up with Dr. Shaw to mBrigitte Pulsee glucose/diabetes - Continue protonix, take medications in the morning with food, watch for stomach upset/dark stools.   Orders Placed This Encounter  Procedures   Sedimentation rate   C-reactive protein     No orders of the defined types were placed in this encounter.   Cc: Shaw, WilGinger Organhaw, WilGinger Organtonia ASarina Illlford [facility] [address] [place] [phone] over 40 minutes of face-to-face and non-face-to-face time with patient on the  1. Temporal arteritis (HCC)     diagnosis.  This included previsit chart review, lab review, study review, order entry, electronic health record  documentation, patient education on the different diagnostic and therapeutic options, counseling and coordination of care, risks and benefits of management, compliance, or risk factor reduction

## 2021-02-10 ENCOUNTER — Encounter: Payer: Self-pay | Admitting: Neurology

## 2021-02-10 ENCOUNTER — Ambulatory Visit (INDEPENDENT_AMBULATORY_CARE_PROVIDER_SITE_OTHER): Payer: Medicare Other | Admitting: Neurology

## 2021-02-10 VITALS — BP 115/66 | HR 73 | Ht 71.0 in | Wt 205.4 lb

## 2021-02-10 DIAGNOSIS — M316 Other giant cell arteritis: Secondary | ICD-10-CM

## 2021-02-10 NOTE — Patient Instructions (Signed)
Temporal Arteritis °Temporal arteritis is a condition that causes arteries to become inflamed. It usually affects arteries in your head and face, but arteries in any part of the body can become inflamed. The condition is also called giant cell arteritis.  °Temporal arteritis can cause serious problems such as blindness. Early treatment can help prevent these problems. °What are the causes? °The cause of this condition is not known. °What increases the risk? °The following factors may make you more likely to develop this condition: °Being older than 50. °Being a woman. °Being Caucasian. °Being of Danish, Swedish, Finnish, Norwegian, or Icelandic ancestry. °Having a family history of the condition. °Having a certain condition that causes muscle pain and stiffness (polymyalgia rheumatica, PMR). °What are the signs or symptoms? °Some people with temporal arteritis have just one symptom, while others have several symptoms. Most symptoms are related to the head and face. These may include: °Headache. °Hard, swollen, or tender temples. This is common. Your temples are the areas on either side of your forehead. °Pain when combing your hair or when laying your head down. °Pain in the jaw when chewing. °Pain in the throat or tongue. °Problems with your vision, such as sudden loss of vision in one eye, or seeing double. °Other symptoms may include: °Fever. °Tiredness (fatigue). °A dry cough. °Pain in the hips and shoulders. °Pain in the arms during exercise. °Depression. °Weight loss. °How is this diagnosed? °This condition may be diagnosed based on: °Your symptoms. °Your medical history. °A physical exam. °Tests, including: °Blood tests. °A test in which a tissue sample is removed from an artery so it can be examined (biopsy). °Imaging tests, such as an ultrasound or MRI. °How is this treated? °This condition may be treated with: °A type of medicine to reduce inflammation (corticosteroid). °Medicines to weaken your immune  system (immunosuppressants). °Other medicines to treat vision problems. °You will need to see your health care provider while you are being treated. The medicines used to treat this condition can increase your risk of problems such as bone loss and diabetes. During follow-up visits, your health care provider will check for problems by: °Doing blood tests and bone density tests. °Checking your blood pressure and blood sugar. °Follow these instructions at home: °Medicines °Take over-the-counter and prescription medicines only as told by your health care provider. °Take any vitamins or supplements recommended by your health care provider. These may include vitamin D and calcium, which help keep your bones from becoming weak. °Eating and drinking ° °Eat a heart-healthy diet. This may include: °Eating high-fiber foods, such as fresh fruits and vegetables, whole grains, and beans. °Eating heart-healthy fats (omega-3 fats), such as fish, flaxseed, and flaxseed oil. °Limiting foods that are high in saturated fat and cholesterol, such as processed and fried foods, fatty meat, and full-fat dairy. °Limiting how much salt (sodium) you eat. °Include calcium and vitamin D in your diet. Good sources of calcium and vitamin D include: °Low-fat dairy products such as milk, yogurt, and cheese. °Certain fish, such as fresh or canned salmon, tuna, and sardines. °Products that have calcium and vitamin D added to them (fortified products), such as fortified cereals or juice. °General instructions °Exercise. Talk with your health care provider about what exercises are okay for you. Exercises that increase your heart rate (aerobic exercise), such as walking, are often recommended. Aerobic exercise helps control your blood pressure and prevent bone loss. °Stay up to date on all vaccines as directed by your health care provider. °Keep all   follow-up visits as told by your health care provider. This is important. °Contact a health care provider  if: °Your symptoms get worse. °You develop signs of infection, such as fever, swelling, redness, warmth, and tenderness. °Get help right away if: °You lose your vision. °Your pain does not go away, even after you take medicine. °You have chest pain. °You have trouble breathing. °One side of your face or body suddenly becomes weak or numb. °These symptoms may represent a serious problem that is an emergency. Do not wait to see if the symptoms will go away. Get medical help right away. Call your local emergency services (911 in the [place] Do not drive yourself to the hospital. °Summary °Temporal arteritis is a condition that causes arteries to become inflamed. It usually affects arteries in your head and face. °This condition can cause serious problems, such as blindness. Treatment can help prevent these problems. °Symptoms may include hard or tender temples, pain in your jaw when chewing, problems with your vision, or pain in your hips and shoulders. °Take over-the-counter and prescription medicines as told by your health care provider. °This information is not intended to replace advice given to you by your health care provider. Make sure you discuss any questions you have with your health care provider. °Document Revised: 06/23/2020 Document Reviewed: 06/23/2020 °Elsevier Patient Education © 2022 [employer] ° °

## 2021-02-11 ENCOUNTER — Telehealth: Payer: Self-pay | Admitting: *Deleted

## 2021-02-11 LAB — C-REACTIVE PROTEIN: CRP: 3 mg/L (ref 0–10)

## 2021-02-11 LAB — SEDIMENTATION RATE: Sed Rate: 3 mm/hr (ref 0–30)

## 2021-02-11 NOTE — Telephone Encounter (Signed)
I called pt, wife picked up, due to he was driving.  I relayed that the labs (ESR, CRP are normal).  Per Dr. Jaynee Eagles, will contact them after speaking with Dr. [name] Dr. [name].  She appreciated call back.

## 2021-02-11 NOTE — Telephone Encounter (Signed)
-----  Message from Melvenia Beam, MD sent at 02/11/2021  7:28 AM EDT ----- Esr and crp normal ! I will contact you after I speak with dr shaw/Beekman

## 2021-02-24 ENCOUNTER — Telehealth: Payer: Self-pay | Admitting: Neurology

## 2021-02-24 NOTE — Telephone Encounter (Signed)
I sent Dr. [name] a message through [facility] I have not heard back from him but I am not sure if he has access to it or not.  I called and left a message with the front staff with my name and my cell phone number having her back.  Today I called again he is out of the office but he left a message for [name] who is taking his calls and again left my name that I am a physician that I like to speak with him about Alex [name] and left my cell phone number.  If I do not hear from him I can keep calling I will keep trying to reach Dr. [name] as well.  Patient is seeing all 3 of [place] for temporal arteritis, patient states he has been told different things from each 1 of [place], requested that I reach out to all physicians and have a conversation, trying.

## 2021-03-01 ENCOUNTER — Telehealth: Payer: Self-pay | Admitting: *Deleted

## 2021-03-01 NOTE — Telephone Encounter (Signed)
Spoke with patient. He stated he saw Dr [name] last week and he was given a paper that told him to take 10 mg of Prednisone for the rest of November, then in December take 9 mg, then 8 mg during January, then continue reducing by 1 mg each month until he finishes in August.  The patient noted this was an awfully long taper period, but he is fine with tapering either way as long as he does not get multiple instructions from different providers.  I told him I would discuss with Dr. Jaynee Eagles and give him a call back. He denies any headaches.  He verbalized appreciation.    Melvenia Beam, MD  P Gna-Pod 4 Results I haven't been able to reach Dr. [name] or Dr. [name]. I'll keep trying but in the meantime tell patient I recommend he reduce his prednisone by 2.5mg  each month. Right now he is on 10mg  a day. When he has completed 4 weeks of that, I would like him on 7.5mg  for 4 weeks then 5mg  for 4 weeks then 2.5mg  for 4 weeks then follow up with me when he is on 2.5mg . If he is ok with that let me know and I can call in a new prescription. He should contact us for any recurrence of a headache.

## 2021-03-03 ENCOUNTER — Telehealth: Payer: Self-pay

## 2021-03-03 NOTE — Telephone Encounter (Signed)
Spoke with patient and discussed message from Dr Jaynee Eagles: "I spoke with both Dr. [name] and Dr. [name].  Dr. [name] would like Dr. [name] to continue treating patient for his temporal arteritis and weaning the steroids.  I also spoke to Dr. [name] who agreed to continue treating patient.  Patient can cancel his follow-up appointment with me here in neurology and continue to wean steroids per Dr. Melissa Noon instructions."  The patient verbalized understanding that Dr. Jaynee Eagles has spoken with both Dr. [name] and Dr. [name]. He is aware that he should follow with Dr. Melissa Noon instructions regarding weaning the steroid as he has agreed to treat the patient.  Pt does not have a follow-up scheduled here.  The patient verbalized understanding and appreciation.

## 2021-03-03 NOTE — Telephone Encounter (Signed)
NOTES SCANNED TO REFERRAL 

## 2021-03-04 ENCOUNTER — Encounter: Payer: Self-pay | Admitting: Cardiovascular Disease

## 2021-03-04 ENCOUNTER — Ambulatory Visit: Payer: Medicare Other | Admitting: Cardiovascular Disease

## 2021-03-04 ENCOUNTER — Other Ambulatory Visit: Payer: Self-pay

## 2021-03-04 VITALS — BP 122/70 | HR 70 | Ht 71.0 in | Wt 206.0 lb

## 2021-03-04 DIAGNOSIS — E7800 Pure hypercholesterolemia, unspecified: Secondary | ICD-10-CM | POA: Diagnosis not present

## 2021-03-04 DIAGNOSIS — I350 Nonrheumatic aortic (valve) stenosis: Secondary | ICD-10-CM

## 2021-03-04 DIAGNOSIS — I25118 Atherosclerotic heart disease of native coronary artery with other forms of angina pectoris: Secondary | ICD-10-CM | POA: Diagnosis not present

## 2021-03-04 DIAGNOSIS — I10 Essential (primary) hypertension: Secondary | ICD-10-CM

## 2021-03-04 NOTE — Progress Notes (Signed)
Chief Complaint  Patient presents with   Follow-up    Chest pain    History of Present Illness: 80 yo male with history of amyloidosis with nephrotic syndrome, HTN, moderate non-obstructive CAD by cath 2011, aortic stenosis, GERD, temporal arteritis, hyperlipidemia and carotid artery disease who is here today for follow up. Carotid artery dopplers 04/05/12 with mild bilateral disease. Stress myoview July 2016 with no ischemia. Echo May 2018 with July 2016 with normal LV function, mild aortic stenosis. He was seen in our office March 2017 by Truitt Merle, NP and at that time noted occasional chest pain. Imdur was added. His chest pain improved. He was diagnosed with amyloidosis in April 2018 treated with chemotherapy for 8 months, then stem cell transplant December 2018 followed by chemotherapy.He is on maintenance chemotherapy. Echo September 2019 with LVEF=60-65%. Mild to moderate AS with mean gradient 19 mmHg, peak gradient 35 mmHg. He was admitted to [facility] July 9767 with complications from Covid 19. He developed respiratory failure and was treated with Remdesivir, steroids, Actemra and plasma. He was diagnosed with a pneumomediastinum. He was found to have a pulmonary embolism and was placed on Eliquis. Echo February 2022 with LVEF=60-65%, mild LVH. Moderately severe aortic stenosis with mean gradient 17 mmHg, peak gradient 32 mmHg, AVA 1.19 cm2, dimensionless index 0.24.  [facility]. He was seen in our office by Dr Tamala Julian 02/08/21 and his chest pain was not felt to be cardiac related. He is now on prednisone for temporal arteritis. He is also back on Eliquis but not sure why.   He is here today for follow up. The patient denies any chest pain, dyspnea, palpitations, lower extremity edema, orthopnea, PND, dizziness, near syncope or syncope.   Primary Care Physician: Ginger Organ., MD  Past Medical History:  Diagnosis Date   AL amyloidosis  [facility]) oncologist-- dr [name]   Renal and Systemic , dx 04/ 2018;  renal bx 08-05-2016 and bone marrow bx 08-29-2016;  8 month chemotherapy then stell cell transplant 12/ 2018 then chemo again;  since then been on oral chemo daily (11-07-2019 per pt has been on hold until after prostate seed implants)   Arthritis    Atypical chest pain    CAD (coronary artery disease) cardiologist-- dr [name];  also followed by cardiologist w/ [facility] in [place], dr [name]   moderate nonobstrucive disease by cath 2011; Lexiscan Myoview 6/14--Normal study, no scar or ischemia, EF 62%;  11-14-2014 nuclear study low risk w/ normal perfusion, nuclear ef 67%   CKD (chronic kidney disease), stage III (HCC)    COVID    b/l PNA (7/20)- 21 day hospitalization tx with steroids, Remdesevir, Actemra, Hydroxychloroquine) --> PE on Eliquis.   Diverticulosis of colon    Dyspnea    WITH EXERTION    ED (erectile dysfunction)    GERD (gastroesophageal reflux disease)    H/O stem cell transplant ([place]) 03/2017   Headache    Heart murmur    Hiatal hernia    History of 2019 novel coronavirus disease (COVID-19) 09/2018   [facility], hospital admission 10-28-2019 respiratory failure/ pneumometrostinom/ PE ;  was not intubated , treatment with remdesivir/ steroids/ actemra/ plasma   History of basal cell carcinoma (BCC)    History of colon polyps    History of MRSA infection    culture positive   History of pulmonary embolus (PE) 10/2018  with covid 19, completed treatment with eliquis   History of seizures    11-07-2019  per pt last one 1970s, unknown cause   History of vertebral compression fracture 1988   lumbar   Hx of adenomatous colonic polyps    negative in 7/08   Hyperlipidemia    Hypertension    IFG (impaired fasting glucose)    Neuropathy due to chemotherapeutic drug (HCC)    fingers tips and feet   Nocturia    On supplemental oxygen therapy    post covid 19;   11-07-2019 pt uses O2  every night 2L via [place],  and uses supplements during the day when walking around neighborhood and goes out to appointments/ store that he has to walk a distance   OSA (obstructive sleep apnea)    study in epic 03-18-2005 mild to moderate   (11-07-2019  per pt used cpap up until when he lost alot of weight, stoped approx. 2012   Prostate cancer [facility]) urologist--- dr pace/  oncology-- dr Tammi Klippel   dx 03/ 2021   Pulmonary fibrosis ([place]) 09/2018   pulmonologist-- dr Vaughan Browner;   post covid 19   Seizure ([place])    1st in 1976; last in 1978 GTC x 2    SOB (shortness of breath) on exertion    11-07-2019  per pt walks around neighborhood for 30 minutes twice daily, he does have to stop frequently d/t sob and since weather has been hot he uses his supplemental oxygen;  stated he has a pulse ox. at home , when he is sitting down O2 sat 96 -97% on RA when up walking around the house O2 sat 93-94% on RA;  pt stated he carry's his O2 w/ him when he goes to doctor appts/store d/t distance   Wears glasses    Wears hearing aid in both ears     Past Surgical History:  Procedure Laterality Date   ARTERY BIOPSY Right 09/17/2020   Procedure: RIGHT TEMPORAL ARTERY BIOPSY;  Surgeon: Armandina Gemma, MD;  Location: [facility];  Service: General;  Laterality: Right;   CARDIAC CATHETERIZATION  05-21-1999  and 12-29-2000  @[facility]   dr Maryjo [place] nuclear study;  moderate nonobstructive disease involving LAD and LCx;  second done due to in Reversal research trial   CARDIAC CATHETERIZATION  07/07/2009  dr [name]   nonobstructive disease w/ normal LVF   [place]   C2 -- 4   CYSTOSCOPY N/A 11/13/2019   Procedure: CYSTOSCOPY FLEXIBLE;  Surgeon: [name], MD;  Location: [facility];  Service: Urology;  Laterality: N/A;  no seeds detected per Dr. Claudia Desanctis   FOOT SURGERY Bilateral x3 last one 1980s   bunionectomy great toe--- x2 right and x1 left   KNEE ARTHROSCOPY  Left 1996   NASAL SEPTUM SURGERY  x3 last one 08-09-2010 @ [place]   PAROTIDECTOMY Right 03/09/2017   RADIOACTIVE SEED IMPLANT N/A 11/13/2019   Procedure: RADIOACTIVE SEED IMPLANT/BRACHYTHERAPY IMPLANT;  Surgeon: [name], MD;  Location: [facility];  Service: Urology;  Laterality: N/A;  ONLY NEEDS 90 MIN FOR ALL PROCEDURES   ROTATOR CUFF REPAIR Bilateral x1 rigth (unsure date);  x2 left last one 09-07-2009 @[facility] ;  right   SPACE OAR INSTILLATION N/A 11/13/2019   Procedure: SPACE OAR INSTILLATION;  Surgeon: [name], MD;  Location: [facility];  Service: Urology;  Laterality: N/A;   TRIGGER FINGER  RELEASE Bilateral x2  last one 2001 approx.   x1 finger left hand;  x2 fingers right hand   UMBILICAL HERNIA REPAIR  01/19/2011   VASECTOMY  approx. 1968 (with anesthesia)    Current Outpatient Medications  Medication Sig Dispense Refill   acyclovir (ZOVIRAX) 200 MG capsule Take 200 mg by mouth 2 (two) times daily.     apixaban (ELIQUIS) 5 MG TABS tablet 5 mg in the morning and at bedtime.     Ascorbic Acid (VITAMIN C) 1000 MG tablet Take 1,000 mg by mouth 2 (two) times daily.     atorvastatin (LIPITOR) 40 MG tablet Take 40 mg by mouth at bedtime.      b complex vitamins capsule Take 1 capsule by mouth daily.     carvedilol (COREG) 6.25 MG tablet Take 6.25 mg by mouth every morning.     Cholecalciferol 25 MCG (1000 UT) tablet Take 1,000 Units by mouth 2 (two) times a day.      ezetimibe (ZETIA) 10 MG tablet Take 1 tablet by mouth daily.     fluticasone (FLONASE) 50 MCG/ACT nasal spray Place 2 sprays into both nostrils daily. 16 g 5   folic acid (FOLVITE) 1 MG tablet Take 1 mg by mouth at bedtime.      furosemide (LASIX) 40 MG tablet Take 40 mg by mouth daily.      Ipratropium-Albuterol (COMBIVENT RESPIMAT) 20-100 MCG/ACT AERS respimat Inhale 1 puff into the lungs every 6 (six) hours as needed for wheezing or shortness of breath.     isosorbide mononitrate  (IMDUR) 15 mg TB24 24 hr tablet Take 15 mg by mouth daily.     Lancets (ONETOUCH DELICA PLUS [id number]) MISC SMARTSIG:Via Meter 2-4 Times Daily     Multiple Vitamin (MULTI-VITAMINS) TABS Take 1 tablet by mouth at bedtime.     mupirocin ointment (BACTROBAN) 2 % Apply to skin lesion TID     nitroGLYCERIN (NITROSTAT) 0.4 MG SL tablet DISSOLVE ONE TABLET UNDER TONGUE EVERY 5 MINUTES AS NEEDED UP TO 3 DOSES, IF NORELIEF CALL 911 25 tablet 8   ONETOUCH VERIO test strip SMARTSIG:Via Meter 2-4 Times Daily     pantoprazole (PROTONIX) 40 MG tablet Take 40 mg by mouth daily.      potassium chloride SA (KLOR-CON) 20 MEQ tablet Take 20 mEq by mouth daily.     predniSONE (DELTASONE) 5 MG tablet 35mg , 30mg  (Start Monday 7/18), 25mg  (start 8/1), 20mg  (start 8/15), 17.5mg  (8/29), 15mg  (9/12), 12.5mg  (9/26) then 10mg  and hold 180 tablet 3   tamsulosin (FLOMAX) 0.4 MG CAPS capsule Take 0.4 mg by mouth 2 (two) times daily.     vitamin B-12 (CYANOCOBALAMIN) 500 MCG tablet Take 500 mcg by mouth daily.     No current facility-administered medications for this visit.    Allergies  Allergen Reactions   Ramipril Cough    Other reaction(s): Cough (ALLERGY/intolerance)    Social History   Socioeconomic History   Marital status: Married    Spouse name: Not on file   Number of children: 2   Years of education: 12   Highest education level: Not on file  Occupational History   Occupation: Retired  Tobacco Use   Smoking status: Former    Packs/day: 3.00    Years: 20.00    Pack years: 60.00    Types: Cigarettes    Quit date: 04/25/1969    Years since quitting: 51.8   Smokeless tobacco: Former    Types: Loss adjuster, chartered  Quit date: 11/07/1983  Vaping Use   Vaping Use: Never used  Substance and Sexual Activity   Alcohol use: Yes    Comment: RARE   Drug use: Never   Sexual activity: Not Currently    Comment: SHIM 1. Reports erectile dysfunction.;    Other Topics Concern   Not on file  Social History Narrative    ** Merged History Encounter **   Lives at home w/ his wife   Right-handed   Caffeine: Dr Malachi Bonds 16 oz/week   Social Determinants of Health   Financial Resource Strain: Not on file  Food Insecurity: Not on file  Transportation Needs: Not on file  Physical Activity: Not on file  Stress: Not on file  Social Connections: Not on file  Intimate Partner Violence: Not on file    Family History  Problem Relation Age of Onset   Hearing loss Mother    Diabetes Father    Heart disease Father    Heart attack Father 73   Hypertension Father    Cancer Sister        1 sister had cancer but patient unsure of type   Eczema Sister    Hypertension Sister    Thyroid disease Sister    Hyperlipidemia Other    Seizures Other    Colon cancer Neg Hx    Stroke Neg Hx    Breast cancer Neg Hx    Pancreatic cancer Neg Hx    Prostate cancer Neg Hx     Review of Systems:  As stated in the HPI and otherwise negative.   BP 122/70   Pulse 70   Ht 5\' 11"  (1.803 m)   Wt 206 lb (93.4 kg)   SpO2 97%   BMI 28.73 kg/m   Physical Examination:  General: Well developed, well nourished, NAD  HEENT: OP clear, mucus membranes moist  SKIN: warm, dry. No rashes. Neuro: No focal deficits  Musculoskeletal: Muscle strength 5/5 all ext  Psychiatric: Mood and affect normal  Neck: No JVD, no carotid bruits, no thyromegaly, no lymphadenopathy.  Lungs:Clear bilaterally, no wheezes, rhonci, crackles Cardiovascular: Regular rate and rhythm. Systolic murmur.  Abdomen:Soft. Bowel sounds present. Non-tender.  Extremities: No lower extremity edema. Pulses are 2 + in the bilateral DP/PT.  Echo February 2022:   1. Left ventricular ejection fraction, by estimation, is 60 to 65%. The  left ventricle has normal function. The left ventricle has no regional  wall motion abnormalities. There is mild concentric left ventricular  hypertrophy. Left ventricular diastolic  parameters are consistent with Grade I diastolic  dysfunction (impaired  relaxation).   2. Right ventricular systolic function is normal. The right ventricular  size is normal.   3. The mitral valve is normal in structure. No evidence of mitral valve  regurgitation. No evidence of mitral stenosis.   4. The aortic valve is calcified. There is mild calcification of the  aortic valve. There is mild thickening of the aortic valve. Aortic valve  regurgitation is trivial. Moderate aortic valve stenosis. Aortic valve  area, by VTI measures 1.19 cm. Aortic  valve mean gradient measures 17.0 mmHg. Aortic valve Vmax measures 2.83  m/s.   5. Aortic dilatation noted. There is mild dilatation of the aortic root,  measuring 38 mm. There is mild dilatation of the ascending aorta,  measuring 37 mm.   6. The inferior vena cava is normal in size with greater than 50%  respiratory variability, suggesting right atrial pressure of 3 mmHg.  7. Compared to prior echo AS appears to have progressed. The AVA (VTI)  has decreased from 1.5 to 1.19cm2, P/M transaortic gradients have  increased fro 20/11 to 32/79mmHg, DI has decreased from 0.35 to 0.24. The  stroke volume index is low at 32. Although   the mean AVG is more consistent with mild AS, the dimensionless index is  low and in setting of low SV this may represent low flow low gradient  aortic stenosis that is at least moderatein severity.   FINDINGS   Left Ventricle: Left ventricular ejection fraction, by estimation, is 60  to 65%. The left ventricle has normal function. The left ventricle has no  regional wall motion abnormalities. Definity contrast agent was given IV  to delineate the left ventricular   endocardial borders. The left ventricular internal cavity size was normal  in size. There is mild concentric left ventricular hypertrophy. Left  ventricular diastolic parameters are consistent with Grade I diastolic  dysfunction (impaired relaxation).  Normal left ventricular filling pressure.    Right Ventricle: The right ventricular size is normal. No increase in  right ventricular wall thickness. Right ventricular systolic function is  normal.   Left Atrium: Left atrial size was normal in size.   Right Atrium: Right atrial size was normal in size.   Pericardium: There is no evidence of pericardial effusion.   Mitral Valve: The mitral valve is normal in structure. Mild mitral annular  calcification. No evidence of mitral valve regurgitation. No evidence of  mitral valve stenosis.   Tricuspid Valve: The tricuspid valve is normal in structure. Tricuspid  valve regurgitation is trivial. No evidence of tricuspid stenosis.   Aortic Valve: The aortic valve is calcified. There is mild calcification  of the aortic valve. There is mild thickening of the aortic valve. Aortic  valve regurgitation is trivial. Moderate aortic stenosis is present.  Aortic valve mean gradient measures  17.0 mmHg. Aortic valve peak gradient measures 32.0 mmHg. Aortic valve  area, by VTI measures 1.19 cm.   Pulmonic Valve: The pulmonic valve was normal in structure. Pulmonic valve  regurgitation is trivial. No evidence of pulmonic stenosis.   Aorta: Aortic dilatation noted. There is mild dilatation of the aortic  root, measuring 38 mm. There is mild dilatation of the ascending aorta,  measuring 37 mm.   Venous: The inferior vena cava is normal in size with greater than 50%  respiratory variability, suggesting right atrial pressure of 3 mmHg.   IAS/Shunts: No atrial level shunt detected by color flow Doppler.      LEFT VENTRICLE  PLAX 2D  LVIDd:         3.50 cm  Diastology  LVIDs:         2.30 cm  LV e' medial:    5.11 cm/s  LV PW:         1.70 cm  LV E/e' medial:  8.5  LV IVS:        1.30 cm  LV e' lateral:   5.55 cm/s  LVOT diam:     2.50 cm  LV E/e' lateral: 7.8  LV SV:         67  LV SV Index:   32  LVOT Area:     4.91 cm      RIGHT VENTRICLE  RV Basal diam:  3.70 cm  RV Mid diam:     3.40 cm  RV S prime:     7.83 cm/s  TAPSE (M-mode): 1.9 cm  LEFT ATRIUM             Index  LA diam:        2.90 cm 1.37 cm/m  LA Vol (A2C):   40.8 ml 19.33 ml/m  LA Vol (A4C):   43.6 ml 20.65 ml/m  LA Biplane Vol: 43.7 ml 20.70 ml/m   AORTIC VALVE  AV Area (Vmax):    1.20 cm  AV Area (Vmean):   1.49 cm  AV Area (VTI):     1.19 cm  AV Vmax:           283.00 cm/s  AV Vmean:          163.333 cm/s  AV VTI:            0.561 m  AV Peak Grad:      32.0 mmHg  AV Mean Grad:      17.0 mmHg  LVOT Vmax:         69.15 cm/s  LVOT Vmean:        49.450 cm/s  LVOT VTI:          0.136 m  LVOT/AV VTI ratio: 0.24     AORTA  Ao Root diam: 3.80 cm  Ao Asc diam:  3.70 cm   MITRAL VALVE  MV Area (PHT):             SHUNTS  MV Decel Time: 393 msec    Systemic VTI:  0.14 m  MV E velocity: 43.30 cm/s  Systemic Diam: 2.50 cm  MV A velocity: 86.10 cm/s  MV E/A ratio:  0.50   EKG:  EKG is ordered today. The ekg ordered today demonstrates sinus  Recent Labs: 12/25/2020: ALT 94; BUN 24; Creatinine 1.35; Hemoglobin 15.1; Platelets 141; Potassium 3.7; Sodium 145   Lipid Panel Followed in primary care   Wt Readings from Last 3 Encounters:  03/04/21 206 lb (93.4 kg)  02/10/21 205 lb 6.4 oz (93.2 kg)  02/08/21 202 lb 3.2 oz (91.7 kg)     Assessment and Plan:   1. CAD with stable angina: He is known to have moderate CAD by cath in 2011. Normal stress test at the [place] in spring 2022. His chest pain is not felt to be cardiac related as it is not exertional. He is now off of ASA since he is back on Eliquis. Given his higher bleeding risk, I think this is reasonable. Will continue beta blocker, Imdur, Zetia and statin.    2. HYPERTENSION: BP is controlled. No changes today     3. HYPERLIPIDEMIA: Lipids followed in primary care. LDL 68. Continue statin   4. Aortic stenosis: Moderate to severe AS by echo in February 2022. Repeat echo February 2023.      5. Amyloidosis: He is being followed in  oncology. He has had chemotherapy and a stem cell transplant. He is in remission.   6. Pulmonary embolism: He is back on Eliquis.   Current medicines are reviewed at length with the patient today.  The patient does not have concerns regarding medicines.  The following changes have been made:  no change  Labs/ tests ordered today include:   Orders Placed This Encounter  Procedures   ECHOCARDIOGRAM COMPLETE     Disposition:   F/U with me in 12 months.   Signed, [name], MD 03/04/2021 9:26 AM    [facility] [place], [place], [place]  [address] Phone: [phone]; Fax: [phone]

## 2021-03-04 NOTE — Patient Instructions (Addendum)
Medication Instructions:  Your physician recommends that you continue on your current medications as directed. Please refer to the Current Medication list given to you today.  *If you need a refill on your cardiac medications before your next appointment, please call your pharmacy*   Lab Work: None   If you have labs (blood work) drawn today and your tests are completely normal, you will receive your results only by: [facility] (if you have MyChart) OR A paper copy in the mail If you have any lab test that is abnormal or we need to change your treatment, we will call you to review the results.   Testing/Procedures: Your physician has requested that you have an echocardiogram on 05/31/21 at 8:15 AM. Please arrive at 8:00 AM. Echocardiography is a painless test that uses sound waves to create images of your heart. It provides your doctor with information about the size and shape of your heart and how well your heart's chambers and valves are working. This procedure takes approximately one hour. There are no restrictions for this procedure.   Follow-Up: At [facility], you and your health needs are our priority.  As part of our continuing mission to provide you with exceptional heart care, we have created designated Provider Care Teams.  These Care Teams include your primary Cardiologist (physician) and Advanced Practice Providers (APPs -  Physician Assistants and Nurse Practitioners) who all work together to provide you with the care you need, when you need it.  We recommend signing up for the patient portal called "MyChart".  Sign up information is provided on this After Visit Summary.  MyChart is used to connect with patients for Virtual Visits (Telemedicine).  Patients are able to view lab/test results, encounter notes, upcoming appointments, etc.  Non-urgent messages can be sent to your provider as well.   To learn more about what you can do with MyChart, go to [email or web address].     Your next appointment:   6 month(s)  The format for your next appointment:   In Person  Provider:   [name], MD     Other Instructions None

## 2021-03-22 ENCOUNTER — Ambulatory Visit (INDEPENDENT_AMBULATORY_CARE_PROVIDER_SITE_OTHER): Payer: No Typology Code available for payment source | Admitting: Pulmonary Disease

## 2021-03-22 ENCOUNTER — Encounter: Payer: Self-pay | Admitting: Pulmonary Disease

## 2021-03-22 ENCOUNTER — Other Ambulatory Visit: Payer: Self-pay

## 2021-03-22 VITALS — BP 124/72 | HR 75 | Temp 97.8°F | Ht 71.0 in | Wt 204.4 lb

## 2021-03-22 DIAGNOSIS — U099 Post covid-19 condition, unspecified: Secondary | ICD-10-CM

## 2021-03-22 DIAGNOSIS — R0602 Shortness of breath: Secondary | ICD-10-CM | POA: Diagnosis not present

## 2021-03-22 NOTE — Progress Notes (Signed)
Alex [name].    [id number]    03-Sep-1940  Primary Care Physician:Shaw, Emily Filbert., MD  Referring Physician: Joseph Art, MD [address] [place] [address]  Chief complaint: Follow up for pulmonary fibrosis, post COVID-19 pneumonia  HPI: [name] is here for evaluation of dyspnea  He has past medical history of AL amyloidosis with nephrotic syndrome, hypertension, moderate nonobstructive CAD by cath 2011, aortic stenosis, GERD, hyperlipidemia, carotid artery disease.  Hospitalized from July 5 to July 25 at [facility] for COVID-19 pneumonia. He has received treatment with Remdesivir 7/6-7/10, steroids for 10 days up until 7/15, received Actemra on 7/11 and plasma on 7/10.  He also received hydroxychloroquine per his request after discussion of risks and benefits. Hospital course complicated with subsegmental pulmonary embolism and he is on anticoagulation,  pneumomediastinum that was treated conservatively.  Maintained on high flow nasal cannula.  Did not need to be intubated He has been discharged on supplemental oxygen.  Continues to have significant dyspnea on exertion at home.  He is working with physical therapy and trying to stay active at home.  Tried on Symbicort but did not help and hence this was stopped.  History notable for amyloidosis status post autologous stem cell transplant in 2018.  Recently saw Dr. Minda Ditto, hematologist and maintenance [place] for amyloidosis is on hold.  Pets: Has a dog and cat.  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  No asbestos, mold, hot tub, Jacuzzi, down pillows or comforters Smoking history: 30-45-pack-year smoker.  Quit in 1971 Travel history: Previously lived in [place].  No significant recent travel Relevant family history: No significant family history of lung disease  Interim history: Here for  follow-up after gap of 1 last years. He had been seen in the [place] in the interim by Dr. Gwenette Greet and restarted on Eliquis for unclear reason  Continues to have significant dyspnea on exertion He had completed pulmonary rehab in 2021 but did not help improve symptoms  He had a nonconclusive sleep study at [facility], had been started on CPAP but could not tolerate it due to chest pain.  He is being managed at the [facility] for this and had home sleep study performed yesterday  Other medical issues since last visit are new diagnosis of temporal arteritis after biopsy in May 2022.  He is on long-term prednisone therapy with slow taper being managed by Dr. [name], rheumatology and Dr. Jaynee Eagles, neurology.  Outpatient Encounter Medications as of 03/22/2021  Medication Sig   acyclovir (ZOVIRAX) 200 MG capsule Take 200 mg by mouth 2 (two) times daily.   apixaban (ELIQUIS) 5 MG TABS tablet 5 mg in the morning and at bedtime.   Ascorbic Acid (VITAMIN C) 1000 MG tablet Take 1,000 mg by mouth 2 (two) times daily.   atorvastatin (LIPITOR) 40 MG tablet Take 40 mg by mouth at bedtime.    b complex vitamins capsule Take 1 capsule by mouth daily.   carvedilol (COREG) 6.25 MG tablet Take 6.25 mg by mouth every morning. In mornings and 1/2 tab at bedtime   Cholecalciferol 25 MCG (1000 UT) tablet Take 1,000 Units by mouth 2 (two) times a day.    ezetimibe (ZETIA) 10 MG tablet Take 1 tablet by mouth daily.   fluticasone (FLONASE) 50 MCG/ACT nasal spray Place 2 sprays into both nostrils daily.  folic acid (FOLVITE) 1 MG tablet Take 1 mg by mouth at bedtime.    furosemide (LASIX) 40 MG tablet Take 40 mg by mouth daily.    Ipratropium-Albuterol (COMBIVENT RESPIMAT) 20-100 MCG/ACT AERS respimat Inhale 1 puff into the lungs every 6 (six) hours as needed for wheezing or shortness of breath.   isosorbide mononitrate (IMDUR) 15 mg TB24 24 hr tablet Take 15 mg by mouth daily.   Lancets (ONETOUCH DELICA PLUS [id number]) MISC  SMARTSIG:Via Meter 2-4 Times Daily   Multiple Vitamin (MULTI-VITAMINS) TABS Take 1 tablet by mouth at bedtime.   mupirocin ointment (BACTROBAN) 2 % Apply to skin lesion TID   nitroGLYCERIN (NITROSTAT) 0.4 MG SL tablet DISSOLVE ONE TABLET UNDER TONGUE EVERY 5 MINUTES AS NEEDED UP TO 3 DOSES, IF NORELIEF CALL 911   ONETOUCH VERIO test strip SMARTSIG:Via Meter 2-4 Times Daily   pantoprazole (PROTONIX) 40 MG tablet Take 40 mg by mouth daily.    potassium chloride SA (KLOR-CON) 20 MEQ tablet Take 20 mEq by mouth daily.   predniSONE (DELTASONE) 5 MG tablet 35mg , 30mg  (Start Monday 7/18), 25mg  (start 8/1), 20mg  (start 8/15), 17.5mg  (8/29), 15mg  (9/12), 12.5mg  (9/26) then 10mg  and hold   tamsulosin (FLOMAX) 0.4 MG CAPS capsule Take 0.4 mg by mouth 2 (two) times daily.   vitamin B-12 (CYANOCOBALAMIN) 500 MCG tablet Take 500 mcg by mouth daily.   No facility-administered encounter medications on file as of 03/22/2021.    Allergies as of 01/10/2019 - Review Complete 01/10/2019  Allergen Reaction Noted   Ramipril Cough    Physical Exam: Blood pressure 124/72, pulse 75, temperature 97.8 F (36.6 C), temperature source Oral, height 5\' 11"  (1.803 m), weight 204 lb 6.4 oz (92.7 kg), SpO2 95 %. Gen:      No acute distress HEENT:  EOMI, sclera anicteric Neck:     No masses; no thyromegaly Lungs:    Clear to auscultation bilaterally; normal respiratory effort CV:         Regular rate and rhythm; no murmurs Abd:      + bowel sounds; soft, non-tender; no palpable masses, no distension Ext:    No edema; adequate peripheral perfusion Skin:      Warm and dry; no rash Neuro: alert and oriented x 3 Psych: normal mood and affect   Data Reviewed: Imaging: CT chest 11/16/2016- peripheral GG opacities at the lung base.  Bilateral pulmonary nodules measuring 5 to 6 mm.   CT abdomen pelvis 12/28/2017- visualized lung bases appear clear. CTA 10/29/2018- no PE.  Multiple bilateral hazy pulmonary infiltrates CTA  11/01/2018- positive for PE.  Diffuse bilateral groundglass opacities. CT chest 11/08/2018 increasing groundglass opacities, consolidative changes.  Small pneumomediastinum.   High-resolution CT 02/04/2019- basilar predominant pulmonary fibrosis with traction bronchiectasis.  No honeycombing.  CT high-resolution 05/09/2019-stable pulmonary fibrosis and probable UIP pattern CT high-resolution 10/24/2019-stable pulmonary pattern and probable UIP pattern, coronary artery disease, aortic atherosclerosis CT chest 10/29/2020-stable pattern of pulmonary fibrosis and probable UIP pattern I have reviewed the images personally.  Data from the [facility] 09/04/2020 Lower lung interstitial lung disease, few pulmonary nodules.  PFTs 04/03/2019 FVC 3.11 [75%], FEV1 2.69 [90%], F/F 87, TLC 4.29 [61%], DLCO 10.94 [44%] Moderate restriction with severe diffusion defect  11/01/2019 FVC 3.23 [28%], FEV1 2.82 [95%], F/F 87, TLC 4.22 [25%], DLCO 15.03 [61%] Minimal restriction with moderate diffusion defect.  01/31/2020 3.11 [9 6%], FEV1 2.62 [89%], F/F 84, TLC 4.41 [63%], DLCO 10.40 [42%] Moderate restriction with severe diffusion defect  6-minute walk test  04/05/2019-408 m, no desats 04/02/2020-306 m, no desats  Cardiac: Echocardiogram 11/08/2018-LVEF 60-65% mild to moderate AS.  Right ventricle is not well visualized right atrial pressure is estimated at 10 mm.  Sleep: Split-night sleep study from [facility] 04/13/2020 Severe sleep apnea, AHI 45.8, fragmented sleep with some PLM's.  Minimum O2 sat of 72%  Recommend CPAP/BiPAP titration  Assessment:  Post COVID-19 pulmonary fibrosis CT reviewed with basilar predominant pulmonary fibrosis and probable UIP pattern PFTs show restriction and diffusion impairment.   Pulmonary fibrosis has remained stable on CT with persistent dyspnea on exertion. He has finished pulmonary rehab last year but continues to be significantly symptomatic  Will reassess again with  HRCT and would like to reexamine his lungs especially as he has a new diagnosis of temporal arteritis which can cause ILD.  Though I suspect that he issues are persistent fibrosis from his prior episode of COVID  He would like to avoid lung function test as it affects his breathing for several days after the test  Sleep apnea Has been intolerant of CPAP.  [facility]  Temporal arteritis Diagnosed after biopsy in May 2022 On tapering doses of steroids  Anticoagulation with Eliquis He was on anticoagulation for 6 months post-COVID hospitalization for PE Now restarted on Eliquis anticoagulation at [facility] for unclear reason.  As per the note this was for atrial fibrillation but there is no mention of this from Dr. Camillia Herter note I will try to get records from the [place] and if there is no clear reason for anticoagulation we can stop  Plan/Recommendations: - HRCT  [name] MD [facility] 03/22/2021, 11:19 AM  CC: Joseph Art, MD

## 2021-03-22 NOTE — Patient Instructions (Signed)
Please get the CT scan done for evaluation of the lungs We will also get records from the [place] regarding your treatment or Continue prednisone taper as directed by Dr. [name] Follow-up in clinic in 1 to 2 months

## 2021-04-20 ENCOUNTER — Ambulatory Visit (INDEPENDENT_AMBULATORY_CARE_PROVIDER_SITE_OTHER)
Admission: RE | Admit: 2021-04-20 | Discharge: 2021-04-20 | Disposition: A | Discharge status: Home / Self Care | Payer: No Typology Code available for payment source | Source: Ambulatory Visit | Attending: Pulmonary Disease | Admitting: Pulmonary Disease

## 2021-04-20 ENCOUNTER — Other Ambulatory Visit: Payer: Self-pay

## 2021-04-20 DIAGNOSIS — U099 Post covid-19 condition, unspecified: Secondary | ICD-10-CM

## 2021-04-20 DIAGNOSIS — R0602 Shortness of breath: Secondary | ICD-10-CM | POA: Diagnosis not present

## 2021-04-22 ENCOUNTER — Telehealth: Payer: Self-pay | Admitting: Pulmonary Disease

## 2021-04-22 NOTE — Telephone Encounter (Signed)
Pt's OV notes have been printed to be faxed to [facility] at [facility]. Nothing further needed.

## 2021-04-28 ENCOUNTER — Encounter: Payer: Self-pay | Admitting: Pulmonary Disease

## 2021-04-28 ENCOUNTER — Ambulatory Visit: Payer: No Typology Code available for payment source

## 2021-04-28 ENCOUNTER — Ambulatory Visit (INDEPENDENT_AMBULATORY_CARE_PROVIDER_SITE_OTHER): Payer: No Typology Code available for payment source | Admitting: Pulmonary Disease

## 2021-04-28 ENCOUNTER — Other Ambulatory Visit: Payer: Self-pay

## 2021-04-28 VITALS — BP 136/78 | HR 85 | Temp 98.0°F | Ht 70.0 in | Wt 207.0 lb

## 2021-04-28 DIAGNOSIS — U099 Post covid-19 condition, unspecified: Secondary | ICD-10-CM | POA: Diagnosis not present

## 2021-04-28 DIAGNOSIS — R0602 Shortness of breath: Secondary | ICD-10-CM

## 2021-04-28 DIAGNOSIS — R0600 Dyspnea, unspecified: Secondary | ICD-10-CM | POA: Diagnosis not present

## 2021-04-28 NOTE — Patient Instructions (Signed)
We will schedule a VQ scan for better evaluation of the lung His CT scan today shows stable scarring of the lung We will check her oxygen levels on exertion today  Follow-up in 3 months

## 2021-04-28 NOTE — Addendum Note (Signed)
Addended by: Elton Sin on: 04/28/2021 04:48 PM   Modules accepted: Orders

## 2021-04-28 NOTE — Progress Notes (Signed)
Alex [name] [place].    [id number]    Oct 28, 1940  Primary Care Physician:Shaw, Emily Filbert., MD  Referring Physician: Ginger Organ., MD [address] [place],  [place] [address]  Chief complaint: Follow up for pulmonary fibrosis, post COVID-19 pneumonia  HPI: Alex [name] is here for evaluation of dyspnea  He has past medical history of AL amyloidosis with nephrotic syndrome, hypertension, moderate nonobstructive CAD by cath 2011, aortic stenosis, GERD, hyperlipidemia, carotid artery disease.  Hospitalized from July 5 to July 25 at [facility] for COVID-19 pneumonia. He has received treatment with Remdesivir 7/6-7/10, steroids for 10 days up until 7/15, received Actemra on 7/11 and plasma on 7/10.  He also received hydroxychloroquine per his request after discussion of risks and benefits. Hospital course complicated with subsegmental pulmonary embolism and he is on anticoagulation,  pneumomediastinum that was treated conservatively.  Maintained on high flow nasal cannula.  Did not need to be intubated He has been discharged on supplemental oxygen.  Continues to have significant dyspnea on exertion at home.  He is working with physical therapy and trying to stay active at home.  Tried on Symbicort but did not help and hence this was stopped.  History notable for amyloidosis status post autologous stem cell transplant in 2018.  Recently saw Dr. Minda Ditto, hematologist and maintenance [place] for amyloidosis is on hold.  Pets: Has a dog and cat.  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  No asbestos, mold, hot tub, Jacuzzi, down pillows or comforters Smoking history: 30-45-pack-year smoker.  Quit in 1971 Travel history: Previously lived in [place].  No significant recent travel Relevant family history: No significant family history of lung disease  Interim history: Here for follow-up  after a long gap He had been seen in the [place] in the interim by Dr. Gwenette Greet and restarted on Eliquis for unclear reason  Continues to have significant dyspnea on exertion He had completed pulmonary rehab in 2021 but did not help improve symptoms  Other medical issues since last visit are new diagnosis of temporal arteritis after biopsy in May 2022.  He is on long-term prednisone therapy with slow taper being managed by Dr. [name], rheumatology and Dr. Jaynee Eagles, neurology.  Here for review of CT scan of chest.  Continues to have significant dyspnea on exertion  Outpatient Encounter Medications as of 04/28/2021  Medication Sig   acyclovir (ZOVIRAX) 200 MG capsule Take 200 mg by mouth 2 (two) times daily.   apixaban (ELIQUIS) 5 MG TABS tablet 5 mg in the morning and at bedtime.   Ascorbic Acid (VITAMIN C) 1000 MG tablet Take 1,000 mg by mouth 2 (two) times daily.   atorvastatin (LIPITOR) 40 MG tablet Take 40 mg by mouth at bedtime.    b complex vitamins capsule Take 1 capsule by mouth daily.   carvedilol (COREG) 6.25 MG tablet Take 6.25 mg by mouth every morning. In mornings and 1/2 tab at bedtime   Cholecalciferol 25 MCG (1000 UT) tablet Take 1,000 Units by mouth 2 (two) times a day.    ezetimibe (ZETIA) 10 MG tablet Take 1 tablet by mouth daily.   fluticasone (FLONASE) 50 MCG/ACT nasal spray Place 2 sprays into both nostrils daily.   folic acid (FOLVITE) 1 MG tablet Take 1 mg by mouth at bedtime.    furosemide (LASIX) 40 MG tablet Take 40 mg by mouth daily.  Ipratropium-Albuterol (COMBIVENT RESPIMAT) 20-100 MCG/ACT AERS respimat Inhale 1 puff into the lungs every 6 (six) hours as needed for wheezing or shortness of breath.   isosorbide mononitrate (IMDUR) 15 mg TB24 24 hr tablet Take 15 mg by mouth daily.   Lancets (ONETOUCH DELICA PLUS [id number]) MISC SMARTSIG:Via Meter 2-4 Times Daily   Multiple Vitamin (MULTI-VITAMINS) TABS Take 1 tablet by mouth at bedtime.   mupirocin ointment (BACTROBAN) 2  % Apply to skin lesion TID   nitroGLYCERIN (NITROSTAT) 0.4 MG SL tablet DISSOLVE ONE TABLET UNDER TONGUE EVERY 5 MINUTES AS NEEDED UP TO 3 DOSES, IF NORELIEF CALL 911   ONETOUCH VERIO test strip SMARTSIG:Via Meter 2-4 Times Daily   pantoprazole (PROTONIX) 40 MG tablet Take 40 mg by mouth daily.    potassium chloride SA (KLOR-CON) 20 MEQ tablet Take 20 mEq by mouth daily.   predniSONE (DELTASONE) 5 MG tablet 35mg , 30mg  (Start Monday 7/18), 25mg  (start 8/1), 20mg  (start 8/15), 17.5mg  (8/29), 15mg  (9/12), 12.5mg  (9/26) then 10mg  and hold   tamsulosin (FLOMAX) 0.4 MG CAPS capsule Take 0.4 mg by mouth 2 (two) times daily.   vitamin B-12 (CYANOCOBALAMIN) 500 MCG tablet Take 500 mcg by mouth daily.   No facility-administered encounter medications on file as of 04/28/2021.    Allergies as of 01/10/2019 - Review Complete 01/10/2019  Allergen Reaction Noted   Ramipril Cough    Physical Exam: Blood pressure 124/72, pulse 75, temperature 97.8 F (36.6 C), temperature source Oral, height 5\' 11"  (1.803 m), weight 204 lb 6.4 oz (92.7 kg), SpO2 95 %. Gen:      No acute distress HEENT:  EOMI, sclera anicteric Neck:     No masses; no thyromegaly Lungs:    Scattered bibasilar crackles CV:         Regular rate and rhythm; no murmurs Abd:      + bowel sounds; soft, non-tender; no palpable masses, no distension Ext:    No edema; adequate peripheral perfusion Skin:      Warm and dry; no rash Neuro: alert and oriented x 3 Psych: normal mood and affect   Data Reviewed: Imaging: CT chest 11/16/2016- peripheral GG opacities at the lung base.  Bilateral pulmonary nodules measuring 5 to 6 mm.   CT abdomen pelvis 12/28/2017- visualized lung bases appear clear. CTA 10/29/2018- no PE.  Multiple bilateral hazy pulmonary infiltrates CTA 11/01/2018- positive for PE.  Diffuse bilateral groundglass opacities. CT chest 11/08/2018 increasing groundglass opacities, consolidative changes.  Small pneumomediastinum.    High-resolution CT 02/04/2019- basilar predominant pulmonary fibrosis with traction bronchiectasis.  No honeycombing.  CT high-resolution 05/09/2019-stable pulmonary fibrosis and probable UIP pattern CT high-resolution 10/24/2019-stable pulmonary pattern and probable UIP pattern, coronary artery disease, aortic atherosclerosis CT chest 10/29/2020-stable pattern of pulmonary fibrosis and probable UIP pattern High-res CT 04/20/2021-stable pattern of probable UIP pattern pulmonary fibrosis I have reviewed the images personally.  Data from the [facility] 09/04/2020 Lower lung interstitial lung disease, few pulmonary nodules.  PFTs 04/03/2019 FVC 3.11 [75%], FEV1 2.69 [90%], F/F 87, TLC 4.29 [61%], DLCO 10.94 [44%] Moderate restriction with severe diffusion defect  11/01/2019 FVC 3.23 [28%], FEV1 2.82 [95%], F/F 87, TLC 4.22 [25%], DLCO 15.03 [61%] Minimal restriction with moderate diffusion defect.  01/31/2020 3.11 [9 6%], FEV1 2.62 [89%], F/F 84, TLC 4.41 [63%], DLCO 10.40 [42%] Moderate restriction with severe diffusion defect  6-minute walk test  04/05/2019-408 m, no desats 04/02/2020-306 m, no desats  Cardiac: Echocardiogram 11/08/2018-LVEF 60-65% mild to moderate AS.  Right ventricle  is not well visualized right atrial pressure is estimated at 10 mm.  Sleep: Split-night sleep study from [facility] 04/13/2020 Severe sleep apnea, AHI 45.8, fragmented sleep with some PLM's.  Minimum O2 sat of 72%  Recommend CPAP/BiPAP titration  Assessment:  Post COVID-19 pulmonary fibrosis CT reviewed with basilar predominant pulmonary fibrosis and probable UIP pattern PFTs show restriction and diffusion impairment.   Pulmonary fibrosis has remained stable on CT with persistent dyspnea on exertion. He has finished pulmonary rehab last year but continues to be significantly symptomatic  High-res CT from last week reviewed with stable scarring in the lung.  He does not want PFTs repeated Suspect his  persistent symptoms are from a combination of mild scarring which has remained stable, cardiac issues, deconditioning and recent weight gain while he is on prednisone for temporal arteritis  He did not desat on exertion today  Sleep apnea On CPAP and 2 L oxygen.  Gets his care from the [place]  Temporal arteritis Diagnosed after biopsy in May 2022 On tapering doses of steroids  Anticoagulation with Eliquis He was on anticoagulation for 6 months post-COVID hospitalization for PE Now restarted on Eliquis anticoagulation at [facility] for unclear reason.  As per the note this was for atrial fibrillation but there is no mention of this from Dr. Camillia Herter note I will try to get records from the [place] and if there is no clear reason for anticoagulation we can stop Check VQ scan to make sure there is no chronic thromboembolism  Plan/Recommendations: -VQ scan -Get records from the [facility] MD [facility] 04/28/2021, 10:58 AM  CC: Ginger Organ., MD

## 2021-05-11 ENCOUNTER — Ambulatory Visit (HOSPITAL_COMMUNITY)
Admission: RE | Admit: 2021-05-11 | Discharge: 2021-05-11 | Disposition: A | Discharge status: Home / Self Care | Payer: No Typology Code available for payment source | Source: Ambulatory Visit | Attending: Pulmonary Disease | Admitting: Pulmonary Disease

## 2021-05-11 ENCOUNTER — Encounter (HOSPITAL_COMMUNITY)
Admission: RE | Admit: 2021-05-11 | Discharge: 2021-05-11 | Disposition: A | Discharge status: Home / Self Care | Payer: No Typology Code available for payment source | Source: Ambulatory Visit | Attending: Pulmonary Disease | Admitting: Pulmonary Disease

## 2021-05-11 ENCOUNTER — Other Ambulatory Visit: Payer: Self-pay

## 2021-05-11 DIAGNOSIS — R0600 Dyspnea, unspecified: Secondary | ICD-10-CM | POA: Insufficient documentation

## 2021-05-11 MED ORDER — TECHNETIUM TO 99M ALBUMIN AGGREGATED
4.4000 | Freq: Once | INTRAVENOUS | Status: AC
  Administered 2021-05-11: 4.4 via INTRAVENOUS

## 2021-05-12 ENCOUNTER — Other Ambulatory Visit: Payer: Self-pay | Admitting: *Deleted

## 2021-05-25 ENCOUNTER — Other Ambulatory Visit: Payer: Self-pay

## 2021-05-25 DIAGNOSIS — C61 Malignant neoplasm of prostate: Secondary | ICD-10-CM

## 2021-05-27 ENCOUNTER — Inpatient Hospital Stay: Payer: Medicare Other | Attending: Hematology

## 2021-05-27 ENCOUNTER — Other Ambulatory Visit: Payer: Self-pay

## 2021-05-27 DIAGNOSIS — Z87891 Personal history of nicotine dependence: Secondary | ICD-10-CM | POA: Insufficient documentation

## 2021-05-27 DIAGNOSIS — Z8546 Personal history of malignant neoplasm of prostate: Secondary | ICD-10-CM | POA: Diagnosis not present

## 2021-05-27 DIAGNOSIS — Z923 Personal history of irradiation: Secondary | ICD-10-CM | POA: Insufficient documentation

## 2021-05-27 DIAGNOSIS — N049 Nephrotic syndrome with unspecified morphologic changes: Secondary | ICD-10-CM | POA: Diagnosis not present

## 2021-05-27 DIAGNOSIS — E8581 Light chain (AL) amyloidosis: Secondary | ICD-10-CM | POA: Insufficient documentation

## 2021-05-27 DIAGNOSIS — C61 Malignant neoplasm of prostate: Secondary | ICD-10-CM

## 2021-05-27 LAB — CMP (CANCER CENTER ONLY)
ALT: 51 U/L — ABNORMAL HIGH (ref 0–44)
AST: 37 U/L (ref 15–41)
Albumin: 4 g/dL (ref 3.5–5.0)
Alkaline Phosphatase: 60 U/L (ref 38–126)
Anion gap: 6 (ref 5–15)
BUN: 29 mg/dL — ABNORMAL HIGH (ref 8–23)
CO2: 29 mmol/L (ref 22–32)
Calcium: 9.3 mg/dL (ref 8.9–10.3)
Chloride: 106 mmol/L (ref 98–111)
Creatinine: 1.3 mg/dL — ABNORMAL HIGH (ref 0.61–1.24)
GFR, Estimated: 56 mL/min — ABNORMAL LOW (ref >60)
Glucose, Bld: 121 mg/dL — ABNORMAL HIGH (ref 70–99)
Potassium: 3.8 mmol/L (ref 3.5–5.1)
Sodium: 141 mmol/L (ref 135–145)
Total Bilirubin: 1.5 mg/dL — ABNORMAL HIGH (ref 0.3–1.2)
Total Protein: 6.5 g/dL (ref 6.5–8.1)

## 2021-05-27 LAB — CBC WITH DIFFERENTIAL (CANCER CENTER ONLY)
Abs Immature Granulocytes: 0.06 10*3/uL (ref 0.00–0.07)
Basophils Absolute: 0 10*3/uL (ref 0.0–0.1)
Basophils Relative: 0 %
Eosinophils Absolute: 0.1 10*3/uL (ref 0.0–0.5)
Eosinophils Relative: 1 %
HCT: 45.7 % (ref 39.0–52.0)
Hemoglobin: 15.5 g/dL (ref 13.0–17.0)
Immature Granulocytes: 1 %
Lymphocytes Relative: 11 %
Lymphs Abs: 1.1 10*3/uL (ref 0.7–4.0)
MCH: 33.2 pg (ref 26.0–34.0)
MCHC: 33.9 g/dL (ref 30.0–36.0)
MCV: 97.9 fL (ref 80.0–100.0)
Monocytes Absolute: 0.5 10*3/uL (ref 0.1–1.0)
Monocytes Relative: 5 %
Neutro Abs: 8 10*3/uL — ABNORMAL HIGH (ref 1.7–7.7)
Neutrophils Relative %: 82 %
Platelet Count: 127 10*3/uL — ABNORMAL LOW (ref 150–400)
RBC: 4.67 MIL/uL (ref 4.22–5.81)
RDW: 13 % (ref 11.5–15.5)
WBC Count: 9.8 10*3/uL (ref 4.0–10.5)
nRBC: 0 % (ref 0.0–0.2)

## 2021-05-28 LAB — KAPPA/LAMBDA LIGHT CHAINS
Kappa free light chain: 21 mg/L — ABNORMAL HIGH (ref 3.3–19.4)
Kappa, lambda light chain ratio: 1.3 (ref 0.26–1.65)
Lambda free light chains: 16.2 mg/L (ref 5.7–26.3)

## 2021-05-31 ENCOUNTER — Other Ambulatory Visit: Payer: Self-pay

## 2021-05-31 ENCOUNTER — Ambulatory Visit (HOSPITAL_COMMUNITY): Payer: Medicare Other | Attending: Cardiology

## 2021-05-31 DIAGNOSIS — I350 Nonrheumatic aortic (valve) stenosis: Secondary | ICD-10-CM | POA: Insufficient documentation

## 2021-05-31 DIAGNOSIS — I351 Nonrheumatic aortic (valve) insufficiency: Secondary | ICD-10-CM

## 2021-05-31 LAB — ECHOCARDIOGRAM COMPLETE
AR max vel: 1.17 cm2
AV Area VTI: 1.27 cm2
AV Area mean vel: 1.33 cm2
AV Mean grad: 14 mmHg
AV Peak grad: 29.2 mmHg
Ao pk vel: 2.7 m/s
Area-P 1/2: 1.98 cm2
S' Lateral: 2.3 cm

## 2021-06-01 LAB — MULTIPLE MYELOMA PANEL, SERUM
Albumin SerPl Elph-Mcnc: 3.6 g/dL (ref 2.9–4.4)
Albumin/Glob SerPl: 1.4 (ref 0.7–1.7)
Alpha 1: 0.2 g/dL (ref 0.0–0.4)
Alpha2 Glob SerPl Elph-Mcnc: 0.8 g/dL (ref 0.4–1.0)
B-Globulin SerPl Elph-Mcnc: 0.9 g/dL (ref 0.7–1.3)
Gamma Glob SerPl Elph-Mcnc: 0.7 g/dL (ref 0.4–1.8)
Globulin, Total: 2.6 g/dL (ref 2.2–3.9)
IgA: 99 mg/dL (ref 61–437)
IgG (Immunoglobin G), Serum: 659 mg/dL (ref 603–1613)
IgM (Immunoglobulin M), Srm: 51 mg/dL (ref 15–143)
Total Protein ELP: 6.2 g/dL (ref 6.0–8.5)

## 2021-06-03 ENCOUNTER — Inpatient Hospital Stay (HOSPITAL_BASED_OUTPATIENT_CLINIC_OR_DEPARTMENT_OTHER): Payer: Medicare Other | Admitting: Hematology

## 2021-06-03 ENCOUNTER — Other Ambulatory Visit: Payer: Self-pay

## 2021-06-03 VITALS — BP 108/66 | HR 69 | Temp 97.5°F | Resp 18 | Wt 207.9 lb

## 2021-06-03 DIAGNOSIS — E8581 Light chain (AL) amyloidosis: Secondary | ICD-10-CM

## 2021-06-03 NOTE — Progress Notes (Addendum)
HEMATOLOGY/ONCOLOGY CLINIC NOTE  Date of Service: 06/03/2021  Patient Care Team: Ginger Organ., MD as PCP - General (Internal Medicine) Burnell Blanks, MD as PCP - Cardiology (Cardiology) Brunetta Genera, MD as Consulting Physician (Hematology) Nephrology - Dr Madelon Lips MD Dr Henrene Pastor MD - GI  CHIEF COMPLAINTS/PURPOSE OF CONSULTATION:  Follow-up for continued evaluation and management of AL amyloidosis  HISTORY OF PRESENTING ILLNESS:  Please see previous notes for details on initial presentation  INTERVAL HISTORY:  Mr Alex [name] is here for continued evaluation and management of his AL amyloidosis.  He notes he has gained weight with the prednisone he is on for possible GCA by his primary doctor. He notes his breathing is stable but still needing oxygen with ambulation. No major infection issues since his last clinic visit. No fevers no chills no night sweats.  No new focal bone pains. Labs done 05/27/2021 were reviewed with him in details. He is accompanied by his wife for this clinic visit.  MEDICAL HISTORY:  Past Medical History:  Diagnosis Date   AL amyloidosis [facility]) oncologist-- dr [name]   Renal and Systemic , dx 04/ 2018;  renal bx 08-05-2016 and bone marrow bx 08-29-2016;  8 month chemotherapy then stell cell transplant 12/ 2018 then chemo again;  since then been on oral chemo daily (11-07-2019 per pt has been on hold until after prostate seed implants)   Arthritis    Atypical chest pain    CAD (coronary artery disease) cardiologist-- dr [name];  also followed by cardiologist w/ [facility] in [place], dr [name]   moderate nonobstrucive disease by cath 2011; Lexiscan Myoview 6/14--Normal study, no scar or ischemia, EF 62%;  11-14-2014 nuclear study low risk w/ normal perfusion, nuclear ef 67%   CKD (chronic kidney disease), stage III (HCC)    COVID    b/l PNA (7/20)- 21 day hospitalization tx with steroids, Remdesevir, Actemra, Hydroxychloroquine)  --> PE on Eliquis.   Diverticulosis of colon    Dyspnea    WITH EXERTION    ED (erectile dysfunction)    GERD (gastroesophageal reflux disease)    H/O stem cell transplant ([place]) 03/2017   Headache    Heart murmur    Hiatal hernia    History of 2019 novel coronavirus disease (COVID-19) 09/2018   [facility], hospital admission 10-28-2019 respiratory failure/ pneumometrostinom/ PE ;  was not intubated , treatment with remdesivir/ steroids/ actemra/ plasma   History of basal cell carcinoma (BCC)    History of colon polyps    History of MRSA infection    culture positive   History of pulmonary embolus (PE) 10/2018   with covid 19, completed treatment with eliquis   History of seizures    11-07-2019  per pt last one 1970s, unknown cause   History of vertebral compression fracture 1988   lumbar   Hx of adenomatous colonic polyps    negative in 7/08   Hyperlipidemia    Hypertension    IFG (impaired fasting glucose)    Neuropathy due to chemotherapeutic drug (HCC)    fingers tips and feet   Nocturia    On supplemental oxygen therapy    post covid 19;   11-07-2019 pt uses O2 every night 2L via [place],  and uses supplements during the day when walking around neighborhood and goes out to appointments/ store that he has to walk a distance   OSA (obstructive sleep apnea)  study in epic 03-18-2005 mild to moderate   (11-07-2019  per pt used cpap up until when he lost alot of weight, stoped approx. 2012   Prostate cancer [facility]) urologist--- dr pace/  oncology-- dr Tammi Klippel   dx 03/ 2021   Pulmonary fibrosis ([place]) 09/2018   pulmonologist-- dr Vaughan Browner;   post covid 19   Seizure ([place])    1st in 1976; last in 1978 GTC x 2    SOB (shortness of breath) on exertion    11-07-2019  per pt walks around neighborhood for 30 minutes twice daily, he does have to stop frequently d/t sob and since weather has been hot he uses his supplemental oxygen;  stated he has a pulse ox. at home ,  when he is sitting down O2 sat 96 -97% on RA when up walking around the house O2 sat 93-94% on RA;  pt stated he carry's his O2 w/ him when he goes to doctor appts/store d/t distance   Wears glasses    Wears hearing aid in both ears   Obstructive sleep apnea Gastroesophageal reflux disease Aortic stenosis Erectile dysfunction Peripheral neuropathy Seizure disorder   SURGICAL HISTORY: Past Surgical History:  Procedure Laterality Date   ARTERY BIOPSY Right 09/17/2020   Procedure: RIGHT TEMPORAL ARTERY BIOPSY;  Surgeon: Armandina Gemma, MD;  Location: [facility];  Service: General;  Laterality: Right;   CARDIAC CATHETERIZATION  05-21-1999  and 12-29-2000  _0   dr Maryjo [place] nuclear study;  moderate nonobstructive disease involving LAD and LCx;  second done due to in Reversal research trial   CARDIAC CATHETERIZATION  07/07/2009  dr [name]   nonobstructive disease w/ normal LVF   CARPAL TUNNEL RELEASE Left 1994   [place]   C2 -- 4   CYSTOSCOPY N/A 11/13/2019   Procedure: CYSTOSCOPY FLEXIBLE;  Surgeon: [name], MD;  Location: [place];  Service: Urology;  Laterality: N/A;  no seeds detected per Dr. Claudia Desanctis   FOOT SURGERY Bilateral x3 last one 1980s   bunionectomy great toe--- x2 right and x1 left   KNEE ARTHROSCOPY Left 1996   NASAL SEPTUM SURGERY  x3 last one 08-09-2010 @ [place]   PAROTIDECTOMY Right 03/09/2017   RADIOACTIVE SEED IMPLANT N/A 11/13/2019   Procedure: RADIOACTIVE SEED IMPLANT/BRACHYTHERAPY IMPLANT;  Surgeon: [name], MD;  Location: [place];  Service: Urology;  Laterality: N/A;  ONLY NEEDS 90 MIN FOR ALL PROCEDURES   ROTATOR CUFF REPAIR Bilateral x1 rigth (unsure date);  x2 left last one 09-07-2009 _1 ;  right   SPACE OAR INSTILLATION N/A 11/13/2019   Procedure: SPACE OAR INSTILLATION;  Surgeon: [name], MD;  Location: [facility];  Service: Urology;  Laterality: N/A;   TRIGGER FINGER  RELEASE Bilateral x2  last one 2001 approx.   x1 finger left hand;  x2 fingers right hand   UMBILICAL HERNIA REPAIR  01/19/2011   VASECTOMY  approx. 66 (with anesthesia)    SOCIAL HISTORY: Social History   Socioeconomic History   Marital status: Married    Spouse name: Not on file   Number of children: 2   Years of education: 12   Highest education level: Not on file  Occupational History   Occupation: Retired  Tobacco Use   Smoking status: Former    Packs/day: 3.00    Years: 20.00    Pack years: 60.00    Types: Cigarettes    Quit date: 04/25/1969  Years since quitting: 52.1   Smokeless tobacco: Former    Types: Chew    Quit date: 11/07/1983  Vaping Use   Vaping Use: Never used  Substance and Sexual Activity   Alcohol use: Yes    Comment: RARE   Drug use: Never   Sexual activity: Not Currently    Comment: SHIM 1. Reports erectile dysfunction.;    Other Topics Concern   Not on file  Social History Narrative   ** Merged History Encounter **   Lives at home w/ his wife   Right-handed   Caffeine: Dr Malachi Bonds 16 oz/week   Social Determinants of Health   Financial Resource Strain: Not on file  Food Insecurity: Not on file  Transportation Needs: Not on file  Physical Activity: Not on file  Stress: Not on file  Social Connections: Not on file  Intimate Partner Violence: Not on file  Patient is currently retired.   FAMILY HISTORY: Family History  Problem Relation Age of Onset   Hearing loss Mother    Diabetes Father    Heart disease Father    Heart attack Father 63   Hypertension Father    Cancer Sister        1 sister had cancer but patient unsure of type   Eczema Sister    Hypertension Sister    Thyroid disease Sister    Hyperlipidemia Other    Seizures Other    Colon cancer Neg Hx    Stroke Neg Hx    Breast cancer Neg Hx    Pancreatic cancer Neg Hx    Prostate cancer Neg Hx     ALLERGIES:  is allergic to ramipril.  MEDICATIONS:  Current  Outpatient Medications  Medication Sig Dispense Refill   acyclovir (ZOVIRAX) 200 MG capsule Take 200 mg by mouth 2 (two) times daily.     Ascorbic Acid (VITAMIN C) 1000 MG tablet Take 1,000 mg by mouth 2 (two) times daily.     atorvastatin (LIPITOR) 40 MG tablet Take 40 mg by mouth at bedtime.      b complex vitamins capsule Take 1 capsule by mouth daily.     carvedilol (COREG) 6.25 MG tablet Take 6.25 mg by mouth every morning. In mornings and 1/2 tab at bedtime     Cholecalciferol 25 MCG (1000 UT) tablet Take 1,000 Units by mouth 2 (two) times a day.      ezetimibe (ZETIA) 10 MG tablet Take 1 tablet by mouth daily.     fluticasone (FLONASE) 50 MCG/ACT nasal spray Place 2 sprays into both nostrils daily. 16 g 5   folic acid (FOLVITE) 1 MG tablet Take 1 mg by mouth at bedtime.      furosemide (LASIX) 40 MG tablet Take 40 mg by mouth daily.      Ipratropium-Albuterol (COMBIVENT RESPIMAT) 20-100 MCG/ACT AERS respimat Inhale 1 puff into the lungs every 6 (six) hours as needed for wheezing or shortness of breath.     isosorbide mononitrate (IMDUR) 15 mg TB24 24 hr tablet Take 15 mg by mouth daily.     Lancets (ONETOUCH DELICA PLUS [id number]) MISC SMARTSIG:Via Meter 2-4 Times Daily     Multiple Vitamin (MULTI-VITAMINS) TABS Take 1 tablet by mouth at bedtime.     mupirocin ointment (BACTROBAN) 2 % Apply to skin lesion TID     nitroGLYCERIN (NITROSTAT) 0.4 MG SL tablet DISSOLVE ONE TABLET UNDER TONGUE EVERY 5 MINUTES AS NEEDED UP TO 3 DOSES, IF NORELIEF CALL 911 25 tablet  8   ONETOUCH VERIO test strip SMARTSIG:Via Meter 2-4 Times Daily     pantoprazole (PROTONIX) 40 MG tablet Take 40 mg by mouth daily.      potassium chloride SA (KLOR-CON) 20 MEQ tablet Take 20 mEq by mouth daily.     predniSONE (DELTASONE) 5 MG tablet 48m, 340m(Start Monday 7/18), 259mstart 8/1), 97m15mtart 8/15), 17.5mg 14m29), 15mg 83m2), 12.5mg (966m) then 10mg an78mld 180 tablet 3   tamsulosin (FLOMAX) 0.4 MG CAPS capsule  Take 0.4 mg by mouth 2 (two) times daily.     vitamin B-12 (CYANOCOBALAMIN) 500 MCG tablet Take 500 mcg by mouth daily.     No current facility-administered medications for this visit.    REVIEW OF SYSTEMS:   10 Point review of Systems was done is negative except as noted above.  NAD  PHYSICAL EXAMINATION: ECOG PERFORMANCE STATUS: 1 - Symptomatic but completely ambulatory  Vitals:   06/03/21 1445  BP: 108/66  Pulse: 69  Resp: 18  Temp: (!) 97.5 F (36.4 C)  SpO2: 97%   Filed Weights   06/03/21 1445  Weight: 207 lb 14.4 oz (94.3 kg)   .Body mass index is 29.83 kg/m.  GENERAL:alert, in no acute distress and comfortable SKIN: no acute rashes, no significant lesions EYES: conjunctiva are pink and non-injected, sclera anicteric OROPHARYNX: MMM, no exudates, no oropharyngeal erythema or ulceration NECK: supple, no JVD LYMPH:  no palpable lymphadenopathy in the cervical, axillary or inguinal regions LUNGS: Bilateral fine rales diffusely more at bases HEART: regular rate & rhythm ABDOMEN:  normoactive bowel sounds , non tender, not distended. Extremity: Trace bilateral pedal edema PSYCH: alert & oriented x 3 with fluent speech NEURO: no focal motor/sensory deficits  LABORATORY DATA:  I have reviewed the data as listed  . CBC Latest Ref Rng & Units 05/27/2021 12/25/2020 11/03/2020  WBC 4.0 - 10.5 K/uL 9.8 9.4 11.6(H)  Hemoglobin 13.0 - 17.0 g/dL 15.5 15.1 15.8  Hematocrit 39.0 - 52.0 % 45.7 43.7 47.9  Platelets 150 - 400 K/uL 127(L) 141(L) 126(L)    CMP Latest Ref Rng & Units 05/27/2021 12/25/2020 11/03/2020  Glucose 70 - 99 mg/dL 121(H) 199(H) 118(H)  BUN 8 - 23 mg/dL 29(H) 24(H) 36(H)  Creatinine 0.61 - 1.24 mg/dL 1.30(H) 1.35(H) 1.29(H)  Sodium 135 - 145 mmol/L 141 145 139  Potassium 3.5 - 5.1 mmol/L 3.8 3.7 4.4  Chloride 98 - 111 mmol/L 106 104 98  CO2 22 - 32 mmol/L _0 Calcium 8.9 - 10.3 mg/dL 9.3 9.4 9.0  Total Protein 6.5 - 8.1 g/dL 6.5 6.7 6.3  Total Bilirubin  0.3 - 1.2 mg/dL 1.5(H) 0.9 1.0  Alkaline Phos 38 - 126 U/L 60 73 77  AST 15 - 41 U/L 37 48(H) 30  ALT 0 - 44 U/L 51(H) 94(H) 58(H)            07/09/2019 Prostate Patholgy Report ([id number] STUDIES: I have personally reviewed the radiological images as listed and agreed with the findings in the report.   ASSESSMENT & PLAN:   80 y.o. 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 Echo shows normal systolic function with only grade 1 diastolic dysfunction. Skeletal survey with no lytic lesions suggestive of multiple myeloma. Serum K/L ratio increased at about 6.22 on diagnosis and has now improved to 1.5 (wnl with  normal kappa lambda serum free light chain ratio ) UPEP shows 24h protein improved from 15g to 5g daily 05/29/18 24hour UPEP no M spike  01/12/18 ECHO revealed LV EF of 60-65%   #2  Nephrotic Syndrome - likely related to AL amyloidosis.  Urine Study 07/26/17 showed Protein at 2.232g.  -07/19/17 the pt had a BM Bx exam which revealed Normocellular bone marrow (30-40%) with 2-3% plasma cells and focal amyloid deposition. UPEP 05/29/2018 - showed no M spike and total urinary protein of 187m/24h -- resolution of nephrotic syndrome.  #3 h/o Prostatic Adenocarcinoma - Gleason Score 3+4=7 s/p brachyradiation therapy by Dr. MTammi Klippel  PLAN: -Patient has been doing about the same and notes no acute new concerns since his last clinic visit. -Labs done from 05/27/2021 reviewed CBC within normal limits other than mild thrombocytopenia of 127k CMP shows stable renal function creatinine 1.3, ALT 51 down from 94 Myeloma panel shows no monoclonal protein spike Serum kappa lambda free light chains normal ratio -No lab or clinical evidence of AL amyloidosis recurrence/progression at this time. -No indication for acute treatment of the patient's  AL amyloidosis at this time and will continue active surveillance.  Has been off Ninlaro maintenance due to other health priorities including severe COVID lung. -Continue follow-up with primary care physician for management of GCA -Continue follow-up with urology for monitoring and management of history of prostate cancer -Continue vitamin D replacement -Continue baby aspirin 81 mg p.o. daily -We shall continue to monitor his labs every 4 months  #4 Patient Active Problem List   Diagnosis Date Noted   Temporal arteritis ([place] 09/24/2020   Sedimentation rate elevation 09/15/2020   Headache 09/15/2020   Malignant neoplasm of prostate ([place] 08/06/2019   Pneumonia due to COVID-19 virus 10/29/2018   Acute respiratory failure with hypoxia ([place] 10/29/2018   Thrombocytopenia ([place] 10/29/2018   Left lower lobe pneumonia 04/22/2018   Sepsis due to pneumonia ([place] 04/22/2018   Engraftment syndrome following stem cell transplantation ([place] 04/27/2017   H[place]O autologous stem cell transplant ([place] 04/10/2017   Aortic stenosis 03/08/2017   Hearing loss 03/08/2017   OSA (obstructive sleep apnea) 03/08/2017   Parotid mass 02/23/2017   Dysfunction of right eustachian tube 02/06/2017   Presbycusis of both ears 175/88/3254  Folliculitis 098/26/4158  AL amyloidosis ([facility] 030/94/0768  Umbilical hernia 008/81/1031  DIZZINESS 02/19/2010   CAD, NATIVE VESSEL 07/21/2009   CHEST PAIN-UNSPECIFIED 07/21/2009   HYPERLIPIDEMIA 04/30/2007   Essential hypertension 04/30/2007   GASTROESOPHAGEAL REFLUX DISEASE 04/30/2007   SEIZURE DISORDER 04/30/2007   COMPRESSION FRACTURE, L1 VERTEBRA 04/30/2007   DIVERTICULOSIS, COLON, HX OF 04/30/2007   Other postprocedural status(V45.89) 04/30/2007  -Management per PCP  FOLLOW UP: RTC with Dr KIrene Limboin 4 months Labs 1 week prior to clinic visit  All of the patient's questions were answered with apparent satisfaction. The patient knows to call the clinic with any  problems, questions or concerns.   GSullivan LoneMD MHallandale BeachAAHIVMS SVa Central California Health Care SystemCCitizens Medical CenterHematology/Oncology Physician [facility]

## 2021-06-20 NOTE — Addendum Note (Signed)
Addended by: Sullivan Lone on: 06/20/2021 03:07 PM   Modules accepted: Orders

## 2021-07-27 ENCOUNTER — Ambulatory Visit (INDEPENDENT_AMBULATORY_CARE_PROVIDER_SITE_OTHER): Payer: No Typology Code available for payment source | Admitting: Pulmonary Disease

## 2021-07-27 ENCOUNTER — Encounter: Payer: Self-pay | Admitting: Pulmonary Disease

## 2021-07-27 VITALS — BP 116/72 | HR 66 | Temp 98.1°F | Ht 71.0 in | Wt 200.4 lb

## 2021-07-27 DIAGNOSIS — R0600 Dyspnea, unspecified: Secondary | ICD-10-CM | POA: Diagnosis not present

## 2021-07-27 DIAGNOSIS — U099 Post covid-19 condition, unspecified: Secondary | ICD-10-CM | POA: Diagnosis not present

## 2021-07-27 NOTE — Progress Notes (Signed)
? ?      ?Alex [name]    [id number]    [birth year] ? ?Primary Care Physician:Shaw, Emily Filbert., MD ? ?Referring Physician: Ginger Organ., MD ?[address] ?[place],  [name] [address] ? ?Chief complaint: Follow up for pulmonary fibrosis, post COVID-19 pneumonia ? ?HPI: ?[name] is here for evaluation of dyspnea ? ?He has past medical history of AL amyloidosis with nephrotic syndrome, hypertension, moderate nonobstructive CAD by cath 2011, aortic stenosis, GERD, hyperlipidemia, carotid artery disease.  Hospitalized from July 5 to July 25 at [facility] for COVID-19 pneumonia. He has received treatment with Remdesivir 7/6-7/10, steroids for 10 days up until 7/15, received Actemra on 7/11 and plasma on 7/10.  He also received hydroxychloroquine per his request after discussion of risks and benefits. Hospital course complicated with subsegmental pulmonary embolism and he is on anticoagulation,  pneumomediastinum that was treated conservatively.  Maintained on high flow nasal cannula.  Did not need to be intubated ?He has been discharged on supplemental oxygen.  Continues to have significant dyspnea on exertion at home.  He is working with physical therapy and trying to stay active at home. ? ?Tried on Symbicort but did not help and hence this was stopped. ? ?History notable for amyloidosis status post autologous stem cell transplant in 2018.  Recently saw Dr. Minda Ditto, hematologist and maintenance Ninlaro for amyloidosis is on hold. ? ?Pets: Has a dog and cat.  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  No asbestos, mold, hot tub, Jacuzzi, down pillows or comforters ?Smoking history: 30-45-pack-year smoker.  Quit in 1971 ?Travel history: Previously lived in [place].  No significant recent travel ?Relevant family history: No significant family history of lung disease ? ?Interim history: ?Return to clinic in  2022 after a long gap. ?He had been seen in the [place] in the interim by Dr. Gwenette Greet and restarted on Eliquis for unclear reason ?We had a VQ scan done in January 2023 which showed no evidence of pulmonary embolism and Eliquis was stopped ? ?Continues to have stable dyspnea on exertion ?He had completed pulmonary rehab in 2021 but did not help improve symptoms ? ?Other medical issues since last visit are new diagnosis of temporal arteritis after biopsy in May 2022.  He is on long-term prednisone therapy with slow taper being managed by Dr. [name], rheumatology and Dr. Jaynee Eagles, neurology. ? ?Outpatient Encounter Medications as of 07/27/2021  ?Medication Sig  ? acyclovir (ZOVIRAX) 200 MG capsule Take 200 mg by mouth 2 (two) times daily.  ? Ascorbic Acid (VITAMIN C) 1000 MG tablet Take 1,000 mg by mouth 2 (two) times daily.  ? atorvastatin (LIPITOR) 40 MG tablet Take 40 mg by mouth at bedtime.   ? b complex vitamins capsule Take 1 capsule by mouth daily.  ? carvedilol (COREG) 6.25 MG tablet Take 6.25 mg by mouth every morning. In mornings and 1/2 tab at bedtime  ? Cholecalciferol 25 MCG (1000 UT) tablet Take 1,000 Units by mouth 2 (two) times a day.   ? ezetimibe (ZETIA) 10 MG tablet Take 1 tablet by mouth daily.  ? fluticasone (FLONASE) 50 MCG/ACT nasal spray Place 2 sprays into both nostrils daily.  ? folic acid (FOLVITE) 1 MG tablet Take 1 mg by mouth at bedtime.   ? furosemide (LASIX) 40 MG tablet Take 40 mg by mouth daily.   ? Ipratropium-Albuterol (COMBIVENT RESPIMAT) 20-100 MCG/ACT AERS respimat Inhale  1 puff into the lungs every 6 (six) hours as needed for wheezing or shortness of breath.  ? isosorbide mononitrate (IMDUR) 15 mg TB24 24 hr tablet Take 15 mg by mouth daily.  ? Lancets (ONETOUCH DELICA PLUS [id number]) MISC SMARTSIG:Via Meter 2-4 Times Daily  ? Multiple Vitamin (MULTI-VITAMINS) TABS Take 1 tablet by mouth at bedtime.  ? mupirocin ointment (BACTROBAN) 2 % Apply to skin lesion TID  ? nitroGLYCERIN  (NITROSTAT) 0.4 MG SL tablet DISSOLVE ONE TABLET UNDER TONGUE EVERY 5 MINUTES AS NEEDED UP TO 3 DOSES, IF NORELIEF CALL 911  ? ONETOUCH VERIO test strip SMARTSIG:Via Meter 2-4 Times Daily  ? pantoprazole (PROTONIX) 40 MG tablet Take 40 mg by mouth daily.   ? potassium chloride SA (KLOR-CON) 20 MEQ tablet Take 20 mEq by mouth daily.  ? predniSONE (DELTASONE) 5 MG tablet '35mg'$ , '30mg'$  (Start Monday 7/18), '25mg'$  (start 8/1), '20mg'$  (start 8/15), 17.'5mg'$  (8/29), '15mg'$  (9/12), 12.'5mg'$  (9/26) then '10mg'$  and hold  ? tamsulosin (FLOMAX) 0.4 MG CAPS capsule Take 0.4 mg by mouth 2 (two) times daily.  ? vitamin B-12 (CYANOCOBALAMIN) 500 MCG tablet Take 500 mcg by mouth daily.  ? ?No facility-administered encounter medications on file as of 07/27/2021.  ? ? ?Allergies as of 01/10/2019 - Review Complete 01/10/2019  ?Allergen Reaction Noted  ? Ramipril Cough   ? ?Physical Exam: ?Blood pressure 116/72, pulse 66, temperature 98.1 ?F (36.7 ?C), temperature source Oral, height '5\' 11"'$  (1.803 m), weight 200 lb 6.4 oz (90.9 kg), SpO2 94 %. ?Gen:      No acute distress ?HEENT:  EOMI, sclera anicteric ?Neck:     No masses; no thyromegaly ?Lungs:    Mild bibasilar crackles ?CV:         Regular rate and rhythm; no murmurs ?Abd:      + bowel sounds; soft, non-tender; no palpable masses, no distension ?Ext:    No edema; adequate peripheral perfusion ?Skin:      Warm and dry; no rash ?Neuro: alert and oriented x 3 ?Psych: normal mood and affect  ? ?Data Reviewed: ?Imaging: ?CT chest 11/16/2016- peripheral GG opacities at the lung base.  Bilateral pulmonary nodules measuring 5 to 6 mm.   ?CT abdomen pelvis 12/28/2017- visualized lung bases appear clear. ?CTA 10/29/2018- no PE.  Multiple bilateral hazy pulmonary infiltrates ?CTA 11/01/2018- positive for PE.  Diffuse bilateral groundglass opacities. ?CT chest 11/08/2018 increasing groundglass opacities, consolidative changes.  Small pneumomediastinum.   ?High-resolution CT 02/04/2019- basilar predominant pulmonary  fibrosis with traction bronchiectasis.  No honeycombing.  ?CT high-resolution 05/09/2019-stable pulmonary fibrosis and probable UIP pattern ?CT high-resolution 10/24/2019-stable pulmonary pattern and probable UIP pattern, coronary artery disease, aortic atherosclerosis ?CT chest 10/29/2020-stable pattern of pulmonary fibrosis and probable UIP pattern ?High-res CT 04/20/2021-stable pattern of probable UIP pattern pulmonary fibrosis ?I have reviewed the images personally. ? ?Data from the [place] ?09/04/2020 ?Lower lung interstitial lung disease, few pulmonary nodules. ? ?PFTs 04/03/2019 ?FVC 3.11 [75%], FEV1 2.69 [90%], F/F 87, TLC 4.29 [61%], DLCO 10.94 [44%] ?Moderate restriction with severe diffusion defect ? ?11/01/2019 ?FVC 3.23 [28%], FEV1 2.82 [95%], F/F 87, TLC 4.22 [25%], DLCO 15.03 [61%] ?Minimal restriction with moderate diffusion defect. ? ?01/31/2020 ?3.11 [9 6%], FEV1 2.62 [89%], F/F 84, TLC 4.41 [63%], DLCO 10.40 [42%] ?Moderate restriction with severe diffusion defect ? ?6-minute walk test  ?04/05/2019-408 m, no desats ?04/02/2020-306 m, no desats ? ?Cardiac: ?Echocardiogram 11/08/2018-LVEF 60-65% mild to moderate AS.  Right ventricle is not well visualized right atrial pressure is  estimated at 10 mm. ? ?Sleep: ?Split-night sleep study from [facility] 04/13/2020 ?Severe sleep apnea, AHI 45.8, fragmented sleep with some PLM's.  Minimum O2 sat of 72% ? ?Recommend CPAP/BiPAP titration ? 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   ?Pulmonary fibrosis has remained stable on CT with persistent dyspnea on exertion. He has finished pulmonary rehab last year but continues to be significantly symptomatic ? ?High-res CT from last week reviewed with stable scarring in the lung.  He does not want PFTs repeated ?Suspect his persistent symptoms are from a combination of mild scarring which  has remained stable, cardiac issues, deconditioning and recent weight gain while he is on prednisone for temporal arteritis ? ?He did not desat on exertion today ? ?Sleep apnea ?On CPAP and 2 L oxygen.  Gets his c

## 2021-07-27 NOTE — Patient Instructions (Signed)
I am glad you are stable with your breathing ?We will get a high-res CT in December for reevaluation ?Follow-up in clinic after CT scan. ?

## 2021-09-24 ENCOUNTER — Ambulatory Visit (INDEPENDENT_AMBULATORY_CARE_PROVIDER_SITE_OTHER): Payer: No Typology Code available for payment source | Admitting: Pulmonary Disease

## 2021-09-24 ENCOUNTER — Encounter: Payer: Self-pay | Admitting: Pulmonary Disease

## 2021-09-24 VITALS — BP 124/82 | HR 67 | Ht 71.0 in | Wt 203.6 lb

## 2021-09-24 DIAGNOSIS — J8410 Pulmonary fibrosis, unspecified: Secondary | ICD-10-CM

## 2021-09-24 DIAGNOSIS — R0602 Shortness of breath: Secondary | ICD-10-CM | POA: Diagnosis not present

## 2021-09-24 NOTE — Progress Notes (Signed)
Synopsis: Referred in June 2023 for shortness of breath by Alessandra Grout, FNP  Subjective:   PATIENT ID: Alex [name]. GENDER: male DOB: [birth year], MRN: [id number]   HPI  Chief Complaint  Patient presents with   Consult    Referred by [facility] for increased SOB since 2020. Currently using 2L of O2 blended in with his cpap machine. Increased dry cough.    Alex [name] is a 81 year old male, former smoker with AL amyloidosis with nephrotic syndrome, hypertension, moderate nonobstructive CAD by cath 2011, aortic stenosis, GERD, hyperlipidemia, and carotid artery disease who is referred to pulmonary clinic for dyspnea.   He was last seen by Dr. Vaughan Browner on 07/27/21 and has been followed for pulmonary fibrosis. He has been referred to our clinic for further evaluation by his [place] team.   He continues to have significant dyspnea and has limited activity level. He completed pulmonary rehab in 2021 without much improvement in his dyspnea. He has occasional dry cough. He reports he is not sleeping well. He is using CPAP with supplemental oxygen.   Review of his imaging shows stable UIP pattern from 01/2019 until 05/11/21.  He is requesting a portable oxygen concentrator. On simple walk today he did not have oxygen desaturations below 88%.  Past Medical History:  Diagnosis Date   AL amyloidosis [facility]) oncologist-- dr [name]   Renal and Systemic , dx 04/ 2018;  renal bx 08-05-2016 and bone marrow bx 08-29-2016;  8 month chemotherapy then stell cell transplant 12/ 2018 then chemo again;  since then been on oral chemo daily (11-07-2019 per pt has been on hold until after prostate seed implants)   Arthritis    Atypical chest pain    CAD (coronary artery disease) cardiologist-- dr [name];  also followed by cardiologist w/ [facility] in [place], dr [name]   moderate nonobstrucive disease by cath 2011; Lexiscan Myoview 6/14--Normal study, no scar or ischemia, EF 62%;  11-14-2014 nuclear  study low risk w/ normal perfusion, nuclear ef 67%   CKD (chronic kidney disease), stage III (HCC)    COVID    b/l PNA (7/20)- 21 day hospitalization tx with steroids, Remdesevir, Actemra, Hydroxychloroquine) --> PE on Eliquis.   Diverticulosis of colon    Dyspnea    WITH EXERTION    ED (erectile dysfunction)    GERD (gastroesophageal reflux disease)    H/O stem cell transplant ([place]) 03/2017   Headache    Heart murmur    Hiatal hernia    History of 2019 novel coronavirus disease (COVID-19) 09/2018   [facility], hospital admission 10-28-2019 respiratory failure/ pneumometrostinom/ PE ;  was not intubated , treatment with remdesivir/ steroids/ actemra/ plasma   History of basal cell carcinoma (BCC)    History of colon polyps    History of MRSA infection    culture positive   History of pulmonary embolus (PE) 10/2018   with covid 19, completed treatment with eliquis   History of seizures    11-07-2019  per pt last one 1970s, unknown cause   History of vertebral compression fracture 1988   lumbar   Hx of adenomatous colonic polyps    negative in 7/08   Hyperlipidemia    Hypertension    IFG (impaired fasting glucose)    Neuropathy due to chemotherapeutic drug (HCC)    fingers tips and feet   Nocturia    On supplemental oxygen therapy    post covid  19;   11-07-2019 pt uses O2 every night 2L via [place],  and uses supplements during the day when walking around neighborhood and goes out to appointments/ store that he has to walk a distance   OSA (obstructive sleep apnea)    study in epic 03-18-2005 mild to moderate   (11-07-2019  per pt used cpap up until when he lost alot of weight, stoped approx. 2012   Prostate cancer [facility]) urologist--- dr pace/  oncology-- dr Tammi Klippel   dx 03/ 2021   Pulmonary fibrosis ([place]) 09/2018   pulmonologist-- dr Vaughan Browner;   post covid 19   Seizure ([place])    1st in 1976; last in 1978 GTC x 2    SOB (shortness of breath) on exertion     11-07-2019  per pt walks around neighborhood for 30 minutes twice daily, he does have to stop frequently d/t sob and since weather has been hot he uses his supplemental oxygen;  stated he has a pulse ox. at home , when he is sitting down O2 sat 96 -97% on RA when up walking around the house O2 sat 93-94% on RA;  pt stated he carry's his O2 w/ him when he goes to doctor appts/store d/t distance   Wears glasses    Wears hearing aid in both ears      Family History  Problem Relation Age of Onset   Hearing loss Mother    Diabetes Father    Heart disease Father    Heart attack Father 43   Hypertension Father    Cancer Sister        1 sister had cancer but patient unsure of type   Eczema Sister    Hypertension Sister    Thyroid disease Sister    Hyperlipidemia Other    Seizures Other    Colon cancer Neg Hx    Stroke Neg Hx    Breast cancer Neg Hx    Pancreatic cancer Neg Hx    Prostate cancer Neg Hx      Social History   Socioeconomic History   Marital status: Married    Spouse name: Not on file   Number of children: 2   Years of education: 12   Highest education level: Not on file  Occupational History   Occupation: Retired  Tobacco Use   Smoking status: Former    Packs/day: 3.00    Years: 20.00    Total pack years: 60.00    Types: Cigarettes    Quit date: 04/25/1969    Years since quitting: 52.4   Smokeless tobacco: Former    Types: Chew    Quit date: 11/07/1983  Vaping Use   Vaping Use: Never used  Substance and Sexual Activity   Alcohol use: Yes    Comment: RARE   Drug use: Never   Sexual activity: Not Currently    Comment: SHIM 1. Reports erectile dysfunction.;    Other Topics Concern   Not on file  Social History Narrative   ** Merged History Encounter **   Lives at home w/ his wife   Right-handed   Caffeine: Dr Malachi Bonds 16 oz/week   Social Determinants of Health   Financial Resource Strain: Not on file  Food Insecurity: Not on file  Transportation Needs:  Not on file  Physical Activity: Not on file  Stress: Not on file  Social Connections: Not on file  Intimate Partner Violence: Not on file     Allergies  Allergen Reactions  Ramipril Cough    Other reaction(s): Cough (ALLERGY/intolerance)     Outpatient Medications Prior to Visit  Medication Sig Dispense Refill   acyclovir (ZOVIRAX) 200 MG capsule Take 200 mg by mouth 2 (two) times daily.     Ascorbic Acid (VITAMIN C) 1000 MG tablet Take 1,000 mg by mouth 2 (two) times daily.     atorvastatin (LIPITOR) 40 MG tablet Take 40 mg by mouth at bedtime.      b complex vitamins capsule Take 1 capsule by mouth daily.     carvedilol (COREG) 6.25 MG tablet Take 6.25 mg by mouth every morning. In mornings and 1/2 tab at bedtime     Cholecalciferol 25 MCG (1000 UT) tablet Take 1,000 Units by mouth 2 (two) times a day.      ezetimibe (ZETIA) 10 MG tablet Take 1 tablet by mouth daily.     fluticasone (FLONASE) 50 MCG/ACT nasal spray Place 2 sprays into both nostrils daily. 16 g 5   folic acid (FOLVITE) 1 MG tablet Take 1 mg by mouth at bedtime.      furosemide (LASIX) 40 MG tablet Take 40 mg by mouth daily.      Ipratropium-Albuterol (COMBIVENT RESPIMAT) 20-100 MCG/ACT AERS respimat Inhale 1 puff into the lungs every 6 (six) hours as needed for wheezing or shortness of breath.     isosorbide mononitrate (IMDUR) 15 mg TB24 24 hr tablet Take 15 mg by mouth daily.     Lancets (ONETOUCH DELICA PLUS [id number]) MISC SMARTSIG:Via Meter 2-4 Times Daily     Multiple Vitamin (MULTI-VITAMINS) TABS Take 1 tablet by mouth at bedtime.     mupirocin ointment (BACTROBAN) 2 % Apply to skin lesion TID     nitroGLYCERIN (NITROSTAT) 0.4 MG SL tablet DISSOLVE ONE TABLET UNDER TONGUE EVERY 5 MINUTES AS NEEDED UP TO 3 DOSES, IF NORELIEF CALL 911 25 tablet 8   ONETOUCH VERIO test strip SMARTSIG:Via Meter 2-4 Times Daily     pantoprazole (PROTONIX) 40 MG tablet Take 40 mg by mouth daily.      potassium chloride SA  (KLOR-CON) 20 MEQ tablet Take 20 mEq by mouth daily.     predniSONE (DELTASONE) 1 MG tablet Take 3 mg by mouth daily with breakfast.     tamsulosin (FLOMAX) 0.4 MG CAPS capsule Take 0.4 mg by mouth 2 (two) times daily.     vitamin B-12 (CYANOCOBALAMIN) 500 MCG tablet Take 500 mcg by mouth daily.     predniSONE (DELTASONE) 5 MG tablet '35mg'$ , '30mg'$  (Start Monday 7/18), '25mg'$  (start 8/1), '20mg'$  (start 8/15), 17.'5mg'$  (8/29), '15mg'$  (9/12), 12.'5mg'$  (9/26) then '10mg'$  and hold 180 tablet 3   No facility-administered medications prior to visit.    Review of Systems  Constitutional:  Negative for chills, fever, malaise/fatigue and weight loss.  HENT:  Negative for congestion, sinus pain and sore throat.   Eyes: Negative.   Respiratory:  Positive for cough and shortness of breath. Negative for hemoptysis and sputum production.   Cardiovascular:  Negative for chest pain, palpitations, orthopnea, claudication and leg swelling.  Gastrointestinal:  Negative for abdominal pain, heartburn, nausea and vomiting.  Genitourinary: Negative.   Musculoskeletal:  Negative for joint pain and myalgias.  Skin:  Negative for rash.  Neurological:  Negative for weakness.  Endo/Heme/Allergies: Negative.   Psychiatric/Behavioral: Negative.     Objective:   Vitals:   09/24/21 1503  BP: 124/82  Pulse: 67  SpO2: 97%  Weight: 203 lb 9.6 oz (92.4 kg)  Height: 5'  11" (1.803 m)   Physical Exam Constitutional:      General: He is not in acute distress. HENT:     Head: Normocephalic and atraumatic.  Eyes:     Extraocular Movements: Extraocular movements intact.     Conjunctiva/sclera: Conjunctivae normal.     Pupils: Pupils are equal, round, and reactive to light.  Cardiovascular:     Rate and Rhythm: Normal rate and regular rhythm.     Pulses: Normal pulses.     Heart sounds: Murmur heard.  Pulmonary:     Effort: Pulmonary effort is normal.     Breath sounds: Rales (basilar) present.  Abdominal:     General: Bowel  sounds are normal.     Palpations: Abdomen is soft.  Musculoskeletal:     Right lower leg: No edema.     Left lower leg: No edema.  Lymphadenopathy:     Cervical: No cervical adenopathy.  Skin:    General: Skin is warm and dry.  Neurological:     General: No focal deficit present.     Mental Status: He is alert.  Psychiatric:        Mood and Affect: Mood normal.        Behavior: Behavior normal.        Thought Content: Thought content normal.        Judgment: Judgment normal.    CBC    Component Value Date/Time   WBC 9.8 05/27/2021 1136   WBC 9.4 12/25/2020 1044   RBC 4.67 05/27/2021 1136   HGB 15.5 05/27/2021 1136   HGB 15.8 11/03/2020 1614   HGB 11.8 (L) 01/30/2017 1329   HCT 45.7 05/27/2021 1136   HCT 47.9 11/03/2020 1614   HCT 34.5 (L) 01/30/2017 1329   PLT 127 (L) 05/27/2021 1136   PLT 126 (L) 11/03/2020 1614   MCV 97.9 05/27/2021 1136   MCV 95 11/03/2020 1614   MCV 98.9 (H) 01/30/2017 1329   MCH 33.2 05/27/2021 1136   MCHC 33.9 05/27/2021 1136   RDW 13.0 05/27/2021 1136   RDW 15.0 11/03/2020 1614   RDW 13.9 01/30/2017 1329   LYMPHSABS 1.1 05/27/2021 1136   LYMPHSABS 1.4 11/03/2020 1614   LYMPHSABS 1.0 01/30/2017 1329   MONOABS 0.5 05/27/2021 1136   MONOABS 0.8 01/30/2017 1329   EOSABS 0.1 05/27/2021 1136   EOSABS 0.0 11/03/2020 1614   BASOSABS 0.0 05/27/2021 1136   BASOSABS 0.1 11/03/2020 1614   BASOSABS 0.0 01/30/2017 1329     Chest imaging: HRCT Chest 05/11/21 1. The appearance of the lungs is compatible with interstitial lung disease, with a spectrum of findings technically categorized as probable usual interstitial pneumonia (UIP) per current ATS guidelines. However, given the evolution in appearance on prior CT examinations dating back to October 29, 2018, and more normal appearance on remote prior CT examination dating back to November 16, 2016, findings are favored to reflect sequela of cryptogenic organizing pneumonia, likely from prior COVID  infection. 2. Aortic atherosclerosis, in addition to left main and three-vessel coronary artery disease. Assessment for potential risk factor modification, dietary therapy or pharmacologic therapy may be warranted, if clinically indicated. 3. There are severe calcifications of the aortic valve. Echocardiographic correlation for evaluation of potential valvular dysfunction may be warranted if clinically indicated. 4. Hepatic steatosis with evidence of hepatic cirrhosis.   PFT:    Latest Ref Rng & Units 01/31/2020   11:59 AM 11/01/2019    9:47 AM 04/03/2019   10:50 AM  PFT  Results  FVC-Pre L 3.15  3.19  3.14   FVC-Predicted Pre % 77  77  76   FVC-Post L 3.11  3.23  3.11   FVC-Predicted Post % 76  78  75   Pre FEV1/FVC % % 84  86  87   Post FEV1/FCV % % 84  87  87   FEV1-Pre L 2.64  2.74  2.73   FEV1-Predicted Pre % 90  92  91   FEV1-Post L 2.62  2.82  2.69   DLCO uncorrected ml/min/mmHg 10.40  15.03  10.94   DLCO UNC% % 42  61  44   DLCO corrected ml/min/mmHg  14.94    DLCO COR %Predicted %  60    DLVA Predicted % 62  81  67   TLC L 4.41  5.22  4.29   TLC % Predicted % 63  75  61   RV % Predicted % 41  56  48   PFT 2021: Mild restriction and severe diffusion defect  Labs:  Path:  Echo 05/31/21: LV EF 60-65%. Moderate LVH. Grade I diastolic dysfunction. RV systolic function is normal. RV size is normal. Moderate aortic valve stenosis.   Heart Catheterization:       Assessment & Plan:   Pulmonary fibrosis (HCC)  Shortness of breath  Discussion: Alex [name] is a 81 year old male, former smoker with AL amyloidosis with nephrotic syndrome, hypertension, moderate nonobstructive CAD by cath 2011, aortic stenosis, GERD, hyperlipidemia, and carotid artery disease who is referred to pulmonary clinic for dyspnea.   He was last seen by Dr. Vaughan Browner on 07/27/21 and has been followed for pulmonary fibrosis. His HRCT Chest scans have been stable over the past 2 years. We  reviewed the most recent CT Chest scan and I showed him and his wife the areas of scarring/abnormalities. We discussed anti-fibrotic therapy and their side effect profiles. I recommended against starting this therapy based on the side effects/risks of the medications compared to the benefits since his disease has been stable over recent years.   On top of pulmonary fibrosis he has moderate aortic stenosis, deconditioning and weight gain over the last year that are all contributing to his dyspnea.   He did not qualify for supplemental oxygen based on simple walk today.   Follow up with Dr. Vaughan Browner as scheduled.  Freda Jackson, MD [facility] Office: [phone]   Current Outpatient Medications:    acyclovir (ZOVIRAX) 200 MG capsule, Take 200 mg by mouth 2 (two) times daily., Disp: , Rfl:    Ascorbic Acid (VITAMIN C) 1000 MG tablet, Take 1,000 mg by mouth 2 (two) times daily., Disp: , Rfl:    atorvastatin (LIPITOR) 40 MG tablet, Take 40 mg by mouth at bedtime. , Disp: , Rfl:    b complex vitamins capsule, Take 1 capsule by mouth daily., Disp: , Rfl:    carvedilol (COREG) 6.25 MG tablet, Take 6.25 mg by mouth every morning. In mornings and 1/2 tab at bedtime, Disp: , Rfl:    Cholecalciferol 25 MCG (1000 UT) tablet, Take 1,000 Units by mouth 2 (two) times a day. , Disp: , Rfl:    ezetimibe (ZETIA) 10 MG tablet, Take 1 tablet by mouth daily., Disp: , Rfl:    fluticasone (FLONASE) 50 MCG/ACT nasal spray, Place 2 sprays into both nostrils daily., Disp: 16 g, Rfl: 5   folic acid (FOLVITE) 1 MG tablet, Take 1 mg by mouth at bedtime. ,  Disp: , Rfl:    furosemide (LASIX) 40 MG tablet, Take 40 mg by mouth daily. , Disp: , Rfl:    Ipratropium-Albuterol (COMBIVENT RESPIMAT) 20-100 MCG/ACT AERS respimat, Inhale 1 puff into the lungs every 6 (six) hours as needed for wheezing or shortness of breath., Disp: , Rfl:    isosorbide mononitrate (IMDUR) 15 mg TB24 24 hr tablet, Take 15 mg  by mouth daily., Disp: , Rfl:    Lancets (ONETOUCH DELICA PLUS [id number]) MISC, SMARTSIG:Via Meter 2-4 Times Daily, Disp: , Rfl:    Multiple Vitamin (MULTI-VITAMINS) TABS, Take 1 tablet by mouth at bedtime., Disp: , Rfl:    mupirocin ointment (BACTROBAN) 2 %, Apply to skin lesion TID, Disp: , Rfl:    nitroGLYCERIN (NITROSTAT) 0.4 MG SL tablet, DISSOLVE ONE TABLET UNDER TONGUE EVERY 5 MINUTES AS NEEDED UP TO 3 DOSES, IF NORELIEF CALL 911, Disp: 25 tablet, Rfl: 8   ONETOUCH VERIO test strip, SMARTSIG:Via Meter 2-4 Times Daily, Disp: , Rfl:    pantoprazole (PROTONIX) 40 MG tablet, Take 40 mg by mouth daily. , Disp: , Rfl:    potassium chloride SA (KLOR-CON) 20 MEQ tablet, Take 20 mEq by mouth daily., Disp: , Rfl:    predniSONE (DELTASONE) 1 MG tablet, Take 3 mg by mouth daily with breakfast., Disp: , Rfl:    tamsulosin (FLOMAX) 0.4 MG CAPS capsule, Take 0.4 mg by mouth 2 (two) times daily., Disp: , Rfl:    vitamin B-12 (CYANOCOBALAMIN) 500 MCG tablet, Take 500 mcg by mouth daily., Disp: , Rfl:

## 2021-09-24 NOTE — Patient Instructions (Addendum)
Your shortness of breath is a combination of your lung fibrosis, moderate aortic stenosis, deconditioning and recent weight gain from prednisone taper.   We will check your Oxygen levels while walking in clinic today.   I do not recommend anti-fibrotic therapy at this time as your CT chest scans have been stable and your lung function tests were stable. The side effect profile of the antifibrotic medications do not outweigh the benefits.   Follow up with Dr. Vaughan Browner as scheduled for follow up CT scan

## 2021-10-01 ENCOUNTER — Encounter: Payer: Self-pay | Admitting: Pulmonary Disease

## 2021-11-04 ENCOUNTER — Telehealth: Payer: Self-pay | Admitting: Pulmonary Disease

## 2021-11-05 NOTE — Telephone Encounter (Signed)
I do not have any paperwork for this patient.

## 2021-11-05 NOTE — Telephone Encounter (Signed)
Alex [name],  Can you confirm if you or JD has this paperwork from this patient from the [place]?  Please advise thank you

## 2021-11-19 ENCOUNTER — Telehealth: Payer: Self-pay | Admitting: Pulmonary Disease

## 2021-11-19 NOTE — Telephone Encounter (Signed)
Office note from last office visit with Dr Erin Fulling printed off and faxed to [facility]. Nothing further needed

## 2021-12-17 ENCOUNTER — Encounter: Payer: Self-pay | Admitting: Cardiovascular Disease

## 2021-12-17 ENCOUNTER — Ambulatory Visit: Payer: Medicare Other | Admitting: Cardiovascular Disease

## 2021-12-17 VITALS — BP 126/64 | HR 71 | Ht 71.0 in | Wt 202.0 lb

## 2021-12-17 DIAGNOSIS — I10 Essential (primary) hypertension: Secondary | ICD-10-CM

## 2021-12-17 DIAGNOSIS — E7800 Pure hypercholesterolemia, unspecified: Secondary | ICD-10-CM | POA: Diagnosis not present

## 2021-12-17 DIAGNOSIS — I350 Nonrheumatic aortic (valve) stenosis: Secondary | ICD-10-CM | POA: Diagnosis not present

## 2021-12-17 DIAGNOSIS — I25118 Atherosclerotic heart disease of native coronary artery with other forms of angina pectoris: Secondary | ICD-10-CM

## 2021-12-17 DIAGNOSIS — E8581 Light chain (AL) amyloidosis: Secondary | ICD-10-CM

## 2021-12-17 NOTE — Patient Instructions (Signed)
Medication Instructions:  No changes *If you need a refill on your cardiac medications before your next appointment, please call your pharmacy*   Lab Work: none If you have labs (blood work) drawn today and your tests are completely normal, you will receive your results only by: [place] (if you have MyChart) OR A paper copy in the mail If you have any lab test that is abnormal or we need to change your treatment, we will call you to review the results.   Testing/Procedures: PLEASE SCHEDULE ECHO FOR FEBRUARY 2024 Your physician has requested that you have an echocardiogram. Echocardiography is a painless test that uses sound waves to create images of your heart. It provides your doctor with information about the size and shape of your heart and how well your heart's chambers and valves are working. This procedure takes approximately one hour. There are no restrictions for this procedure.   Follow-Up: At [facility], you and your health needs are our priority.  As part of our continuing mission to provide you with exceptional heart care, we have created designated Provider Care Teams.  These Care Teams include your primary Cardiologist (physician) and Advanced Practice Providers (APPs -  Physician Assistants and Nurse Practitioners) who all work together to provide you with the care you need, when you need it.  We recommend signing up for the patient portal called "MyChart".  Sign up information is provided on this After Visit Summary.  MyChart is used to connect with patients for Virtual Visits (Telemedicine).  Patients are able to view lab/test results, encounter notes, upcoming appointments, etc.  Non-urgent messages can be sent to your provider as well.   To learn more about what you can do with MyChart, go to [email or web address].    Your next appointment:   6 month(s) (March 2024)  The format for your next appointment:   In Person  Provider:   [name], MD      Important Information About Sugar

## 2021-12-17 NOTE — Progress Notes (Signed)
Chief Complaint  Patient presents with   Follow-up    CAD   History of Present Illness: 81 yo male with history of amyloidosis with nephrotic syndrome, HTN, moderate non-obstructive CAD by cath 2011, aortic stenosis, GERD, temporal arteritis, hyperlipidemia and carotid artery disease who is here today for follow up. Carotid artery dopplers 04/05/12 with mild bilateral disease. Stress myoview July 2016 with no ischemia. Echo May 2018 with July 2016 with normal LV function, mild aortic stenosis. He was seen in our office March 2017 by Truitt Merle, NP and at that time noted occasional chest pain. Imdur was added. His chest pain improved. He was diagnosed with amyloidosis in April 2018 treated with chemotherapy for 8 months, then stem cell transplant December 2018 followed by chemotherapy.He is on maintenance chemotherapy. Echo September 2019 with LVEF=60-65%. Mild to moderate AS with mean gradient 19 mmHg, peak gradient 35 mmHg. He was admitted to [facility] July 2426 with complications from Covid 19. He developed respiratory failure and was treated with Remdesivir, steroids, Actemra and plasma. He was diagnosed with a pneumomediastinum. He was found to have a pulmonary embolism and was placed on Eliquis. Echo February 2022 with LVEF=60-65%, mild LVH. Moderately severe aortic stenosis with mean gradient 17 mmHg, peak gradient 32 mmHg, AVA 1.19 cm2, dimensionless index 0.24.  [facility]. He was seen in our office by Dr Tamala Julian 02/08/21 and his chest pain was not felt to be cardiac related. He is now on prednisone for temporal arteritis.  He is here today for follow up. The patient denies any chest pain, dyspnea, palpitations, lower extremity edema, orthopnea, PND, dizziness, near syncope or syncope.   Primary Care Physician: Ginger Organ., MD  Past Medical History:  Diagnosis Date   AL amyloidosis [facility]) oncologist-- dr [name]   Renal and Systemic , dx 04/  2018;  renal bx 08-05-2016 and bone marrow bx 08-29-2016;  8 month chemotherapy then stell cell transplant 12/ 2018 then chemo again;  since then been on oral chemo daily (11-07-2019 per pt has been on hold until after prostate seed implants)   Arthritis    Atypical chest pain    CAD (coronary artery disease) cardiologist-- dr [name];  also followed by cardiologist w/ [facility] in [place], dr [name]   moderate nonobstrucive disease by cath 2011; Lexiscan Myoview 6/14--Normal study, no scar or ischemia, EF 62%;  11-14-2014 nuclear study low risk w/ normal perfusion, nuclear ef 67%   CKD (chronic kidney disease), stage III (HCC)    COVID    b/l PNA (7/20)- 21 day hospitalization tx with steroids, Remdesevir, Actemra, Hydroxychloroquine) --> PE on Eliquis.   Diverticulosis of colon    Dyspnea    WITH EXERTION    ED (erectile dysfunction)    GERD (gastroesophageal reflux disease)    H/O stem cell transplant ([place]) 03/2017   Headache    Heart murmur    Hiatal hernia    History of 2019 novel coronavirus disease (COVID-19) 09/2018   [facility], hospital admission 10-28-2019 respiratory failure/ pneumometrostinom/ PE ;  was not intubated , treatment with remdesivir/ steroids/ actemra/ plasma   History of basal cell carcinoma (BCC)    History of colon polyps    History of MRSA infection    culture positive   History of pulmonary embolus (PE) 10/2018   with covid 19, completed treatment with eliquis   History of seizures  11-07-2019  per pt last one 1970s, unknown cause   History of vertebral compression fracture 1988   lumbar   Hx of adenomatous colonic polyps    negative in 7/08   Hyperlipidemia    Hypertension    IFG (impaired fasting glucose)    Neuropathy due to chemotherapeutic drug (HCC)    fingers tips and feet   Nocturia    On supplemental oxygen therapy    post covid 19;   11-07-2019 pt uses O2 every night 2L via [place],  and uses supplements during the  day when walking around neighborhood and goes out to appointments/ store that he has to walk a distance   OSA (obstructive sleep apnea)    study in epic 03-18-2005 mild to moderate   (11-07-2019  per pt used cpap up until when he lost alot of weight, stoped approx. 2012   Prostate cancer [facility]) urologist--- dr pace/  oncology-- dr Tammi Klippel   dx 03/ 2021   Pulmonary fibrosis ([place]) 09/2018   pulmonologist-- dr Vaughan Browner;   post covid 19   Seizure ([place])    1st in 1976; last in 1978 GTC x 2    SOB (shortness of breath) on exertion    11-07-2019  per pt walks around neighborhood for 30 minutes twice daily, he does have to stop frequently d/t sob and since weather has been hot he uses his supplemental oxygen;  stated he has a pulse ox. at home , when he is sitting down O2 sat 96 -97% on RA when up walking around the house O2 sat 93-94% on RA;  pt stated he carry's his O2 w/ him when he goes to doctor appts/store d/t distance   Wears glasses    Wears hearing aid in both ears     Past Surgical History:  Procedure Laterality Date   ARTERY BIOPSY Right 09/17/2020   Procedure: RIGHT TEMPORAL ARTERY BIOPSY;  Surgeon: Armandina Gemma, MD;  Location: [facility];  Service: General;  Laterality: Right;   CARDIAC CATHETERIZATION  05-21-1999  and 12-29-2000  '@[facility]   dr Maryjo [place] nuclear study;  moderate nonobstructive disease involving LAD and LCx;  second done due to in Reversal research trial   CARDIAC CATHETERIZATION  07/07/2009  dr [name]   nonobstructive disease w/ normal LVF   [place]   C2 -- 4   CYSTOSCOPY N/A 11/13/2019   Procedure: CYSTOSCOPY FLEXIBLE;  Surgeon: [name], MD;  Location: [facility];  Service: Urology;  Laterality: N/A;  no seeds detected per Dr. Claudia Desanctis   FOOT SURGERY Bilateral x3 last one 1980s   bunionectomy great toe--- x2 right and x1 left   KNEE ARTHROSCOPY Left 1996   NASAL SEPTUM SURGERY  x3 last one 08-09-2010  @ [place]   PAROTIDECTOMY Right 03/09/2017   RADIOACTIVE SEED IMPLANT N/A 11/13/2019   Procedure: RADIOACTIVE SEED IMPLANT/BRACHYTHERAPY IMPLANT;  Surgeon: [name], MD;  Location: [facility] [place];  Service: Urology;  Laterality: N/A;  ONLY NEEDS 90 MIN FOR ALL PROCEDURES   ROTATOR CUFF REPAIR Bilateral x1 rigth (unsure date);  x2 left last one 09-07-2009 '@[facility] ;  right   SPACE OAR INSTILLATION N/A 11/13/2019   Procedure: SPACE OAR INSTILLATION;  Surgeon: [name], MD;  Location: [facility];  Service: Urology;  Laterality: N/A;   TRIGGER FINGER RELEASE Bilateral x2  last one 2001 approx.   x1 finger left hand;  x2 fingers right hand   UMBILICAL HERNIA REPAIR  01/19/2011   VASECTOMY  approx. 1968 (with anesthesia)    Current Outpatient Medications  Medication Sig Dispense Refill   acyclovir (ZOVIRAX) 200 MG capsule Take 200 mg by mouth 2 (two) times daily.     apixaban (ELIQUIS) 5 MG TABS tablet Take 5 mg by mouth 2 (two) times daily.     Ascorbic Acid (VITAMIN C) 1000 MG tablet Take 1,000 mg by mouth 2 (two) times daily.     atorvastatin (LIPITOR) 40 MG tablet Take 40 mg by mouth at bedtime.      b complex vitamins capsule Take 1 capsule by mouth daily.     carvedilol (COREG) 6.25 MG tablet Take 6.25 mg by mouth every morning. In mornings and 1/2 tab at bedtime     Cholecalciferol 25 MCG (1000 UT) tablet Take 1,000 Units by mouth 2 (two) times a day.      ezetimibe (ZETIA) 10 MG tablet Take 1 tablet by mouth daily.     fluticasone (FLONASE) 50 MCG/ACT nasal spray Place 2 sprays into both nostrils daily. 16 g 5   folic acid (FOLVITE) 1 MG tablet Take 1 mg by mouth at bedtime.      furosemide (LASIX) 40 MG tablet Take 40 mg by mouth daily.      Ipratropium-Albuterol (COMBIVENT RESPIMAT) 20-100 MCG/ACT AERS respimat Inhale 1 puff into the lungs every 6 (six) hours as needed for wheezing or shortness of breath.     isosorbide mononitrate (IMDUR) 15 mg TB24 24  hr tablet Take 15 mg by mouth daily.     Lancets (ONETOUCH DELICA PLUS [id number]) MISC SMARTSIG:Via Meter 2-4 Times Daily     Multiple Vitamin (MULTI-VITAMINS) TABS Take 1 tablet by mouth at bedtime.     mupirocin ointment (BACTROBAN) 2 % Apply to skin lesion TID     nitroGLYCERIN (NITROSTAT) 0.4 MG SL tablet DISSOLVE ONE TABLET UNDER TONGUE EVERY 5 MINUTES AS NEEDED UP TO 3 DOSES, IF NORELIEF CALL 911 25 tablet 8   ONETOUCH VERIO test strip SMARTSIG:Via Meter 2-4 Times Daily     pantoprazole (PROTONIX) 40 MG tablet Take 40 mg by mouth daily.      potassium chloride SA (KLOR-CON) 20 MEQ tablet Take 20 mEq by mouth daily.     predniSONE (DELTASONE) 1 MG tablet Take 3 mg by mouth daily with breakfast.     tamsulosin (FLOMAX) 0.4 MG CAPS capsule Take 0.4 mg by mouth 2 (two) times daily.     vitamin B-12 (CYANOCOBALAMIN) 500 MCG tablet Take 500 mcg by mouth daily.     No current facility-administered medications for this visit.    Allergies  Allergen Reactions   Ramipril Cough    Other reaction(s): Cough (ALLERGY/intolerance)    Social History   Socioeconomic History   Marital status: Married    Spouse name: Not on file   Number of children: 2   Years of education: 12   Highest education level: Not on file  Occupational History   Occupation: Retired  Tobacco Use   Smoking status: Former    Packs/day: 3.00    Years: 20.00    Total pack years: 60.00    Types: Cigarettes    Quit date: 04/25/1969    Years since quitting: 52.6   Smokeless tobacco: Former    Types: Chew    Quit date: 11/07/1983  Vaping Use   Vaping Use: Never used  Substance and Sexual Activity  Alcohol use: Yes    Comment: RARE   Drug use: Never   Sexual activity: Not Currently    Comment: SHIM 1. Reports erectile dysfunction.;    Other Topics Concern   Not on file  Social History Narrative   ** Merged History Encounter **   Lives at home w/ his wife   Right-handed   Caffeine: Dr Malachi Bonds 16 oz/week    Social Determinants of Health   Financial Resource Strain: Not on file  Food Insecurity: Not on file  Transportation Needs: Not on file  Physical Activity: Not on file  Stress: Not on file  Social Connections: Not on file  Intimate Partner Violence: Not on file    Family History  Problem Relation Age of Onset   Hearing loss Mother    Diabetes Father    Heart disease Father    Heart attack Father 35   Hypertension Father    Cancer Sister        1 sister had cancer but patient unsure of type   Eczema Sister    Hypertension Sister    Thyroid disease Sister    Hyperlipidemia Other    Seizures Other    Colon cancer Neg Hx    Stroke Neg Hx    Breast cancer Neg Hx    Pancreatic cancer Neg Hx    Prostate cancer Neg Hx     Review of Systems:  As stated in the HPI and otherwise negative.   BP 126/64   Pulse 71   Ht '5\' 11"'$  (1.803 m)   Wt 202 lb (91.6 kg)   SpO2 97%   BMI 28.17 kg/m   Physical Examination:  General: Well developed, well nourished, NAD  HEENT: OP clear, mucus membranes moist  SKIN: warm, dry. No rashes. Neuro: No focal deficits  Musculoskeletal: Muscle strength 5/5 all ext  Psychiatric: Mood and affect normal  Neck: No JVD, no carotid bruits, no thyromegaly, no lymphadenopathy.  Lungs:Clear bilaterally, no wheezes, rhonci, crackles Cardiovascular: Regular rate and rhythm. Systolic murmur.  Abdomen:Soft. Bowel sounds present. Non-tender.  Extremities: No lower extremity edema. Pulses are 2 + in the bilateral DP/PT.   Echo February 2023:   1. Left ventricular ejection fraction, by estimation, is 60 to 65%. The  left ventricle has normal function. The left ventricle has no regional  wall motion abnormalities. There is moderate left ventricular hypertrophy.  Left ventricular diastolic  parameters are consistent with Grade I diastolic dysfunction (impaired  relaxation).   2. Right ventricular systolic function is normal. The right ventricular  size  is normal. Tricuspid regurgitation signal is inadequate for assessing  PA pressure.   3. The mitral valve is normal in structure. No evidence of mitral valve  regurgitation. No evidence of mitral stenosis.   4. The aortic valve was not well visualized. There is moderate  calcification of the aortic valve. Aortic valve regurgitation is mild.  Moderate aortic valve stenosis. Vmax 2.8 m/s, MG 60mHg, AVA 1.2 cm^2, DI  0.32   EKG:  EKG is not ordered today. The ekg ordered today demonstrates   Recent Labs: 05/27/2021: ALT 51; BUN 29; Creatinine 1.30; Hemoglobin 15.5; Platelet Count 127; Potassium 3.8; Sodium 141   Lipid Panel Followed in primary care   Wt Readings from Last 3 Encounters:  12/17/21 202 lb (91.6 kg)  09/24/21 203 lb 9.6 oz (92.4 kg)  07/27/21 200 lb 6.4 oz (90.9 kg)     Assessment and Plan:   1. CAD with  stable angina: He is known to have moderate CAD by cath in 2011. Normal stress test at the [place] in spring 2022. He is not on ASA. Continue beta blocker, Imdur, statin and Zetia.     2. HYPERTENSION: BP is well controlled. No changes today     3. HYPERLIPIDEMIA: Lipids followed in primary care. Continue statin and Zetia.    4. Aortic stenosis: Moderate to severe AS by echo in February 2023. Repeat echo February 2024      5. Amyloidosis: He is being followed in oncology. He has had chemotherapy and a stem cell transplant. He is in remission.   6. Pulmonary embolism: he remains on Eliquis   Current medicines are reviewed at length with the patient today.  The patient does not have concerns regarding medicines.  The following changes have been made:  no change  Labs/ tests ordered today include:   Orders Placed This Encounter  Procedures   ECHOCARDIOGRAM COMPLETE     Disposition:   F/U with me in 6 months.   Signed, [name], MD 12/17/2021 10:32 AM    [facility] [address], [place], [place]  [address] Phone: [phone]; Fax: [phone]

## 2021-12-23 DIAGNOSIS — I4891 Unspecified atrial fibrillation: Secondary | ICD-10-CM | POA: Insufficient documentation

## 2021-12-23 DIAGNOSIS — N1831 Chronic kidney disease, stage 3a: Secondary | ICD-10-CM | POA: Insufficient documentation

## 2021-12-23 DIAGNOSIS — H903 Sensorineural hearing loss, bilateral: Secondary | ICD-10-CM | POA: Insufficient documentation

## 2021-12-23 DIAGNOSIS — U099 Post covid-19 condition, unspecified: Secondary | ICD-10-CM | POA: Insufficient documentation

## 2021-12-23 DIAGNOSIS — E1140 Type 2 diabetes mellitus with diabetic neuropathy, unspecified: Secondary | ICD-10-CM | POA: Insufficient documentation

## 2022-02-28 ENCOUNTER — Ambulatory Visit (INDEPENDENT_AMBULATORY_CARE_PROVIDER_SITE_OTHER): Payer: No Typology Code available for payment source | Admitting: Pulmonary Disease

## 2022-02-28 ENCOUNTER — Encounter: Payer: Self-pay | Admitting: Pulmonary Disease

## 2022-02-28 VITALS — BP 104/58 | HR 69 | Temp 97.8°F | Ht 71.0 in | Wt 202.6 lb

## 2022-02-28 DIAGNOSIS — J8410 Pulmonary fibrosis, unspecified: Secondary | ICD-10-CM

## 2022-02-28 NOTE — Patient Instructions (Addendum)
I will look for any paper work that you brought from the [place] and let you know if we have it or not.   Continue combivent inhaler as needed  Continue CPAP with Oxygen at night  CT Chest Scan is scheduled for 2pm on 04/08/22  Follow up in 6 months

## 2022-02-28 NOTE — Progress Notes (Unsigned)
Synopsis: Referred in June 2023 for shortness of breath by Alessandra Grout, FNP  Subjective:   PATIENT ID: Alex [name]. GENDER: male DOB: 1941-01-20, MRN: [id number]  HPI  Chief Complaint  Patient presents with   Office Visit    Follow-up PT states DOE   Anirudh Baiz is a 81 year old male, former smoker with AL amyloidosis with nephrotic syndrome, hypertension, moderate nonobstructive CAD by cath 2011, aortic stenosis, GERD, hyperlipidemia, and carotid artery disease who returns to pulmonary clinic for dyspnea.   He has been stable since last visit. No acute complaints at this time. He remains on CPAP at night.  Initial OV 09/24/21 He was last seen by Dr. Vaughan Browner on 07/27/21 and has been followed for pulmonary fibrosis. He has been referred to our clinic for further evaluation by his [facility] team.   He continues to have significant dyspnea and has limited activity level. He completed pulmonary rehab in 2021 without much improvement in his dyspnea. He has occasional dry cough. He reports he is not sleeping well. He is using CPAP with supplemental oxygen.   Review of his imaging shows stable UIP pattern from 01/2019 until 05/11/21.  He is requesting a portable oxygen concentrator. On simple walk today he did not have oxygen desaturations below 88%.  Past Medical History:  Diagnosis Date   AL amyloidosis [facility]) oncologist-- dr [name]   Renal and Systemic , dx 04/ 2018;  renal bx 08-05-2016 and bone marrow bx 08-29-2016;  8 month chemotherapy then stell cell transplant 12/ 2018 then chemo again;  since then been on oral chemo daily (11-07-2019 per pt has been on hold until after prostate seed implants)   Arthritis    Atypical chest pain    CAD (coronary artery disease) cardiologist-- dr [name];  also followed by cardiologist w/ [facility] in [place], dr [name]   moderate nonobstrucive disease by cath 2011; Lexiscan Myoview 6/14--Normal study, no scar or ischemia, EF 62%;   11-14-2014 nuclear study low risk w/ normal perfusion, nuclear ef 67%   CKD (chronic kidney disease), stage III (HCC)    COVID    b/l PNA (7/20)- 21 day hospitalization tx with steroids, Remdesevir, Actemra, Hydroxychloroquine) --> PE on Eliquis.   Diverticulosis of colon    Dyspnea    WITH EXERTION    ED (erectile dysfunction)    GERD (gastroesophageal reflux disease)    H/O stem cell transplant ([place]) 03/2017   Headache    Heart murmur    Hiatal hernia    History of 2019 novel coronavirus disease (COVID-19) 09/2018   [facility], hospital admission 10-28-2019 respiratory failure/ pneumometrostinom/ PE ;  was not intubated , treatment with remdesivir/ steroids/ actemra/ plasma   History of basal cell carcinoma (BCC)    History of colon polyps    History of MRSA infection    culture positive   History of pulmonary embolus (PE) 10/2018   with covid 19, completed treatment with eliquis   History of seizures    11-07-2019  per pt last one 1970s, unknown cause   History of vertebral compression fracture 1988   lumbar   Hx of adenomatous colonic polyps    negative in 7/08   Hyperlipidemia    Hypertension    IFG (impaired fasting glucose)    Neuropathy due to chemotherapeutic drug (HCC)    fingers tips and feet   Nocturia    On supplemental oxygen therapy  post covid 19;   11-07-2019 pt uses O2 every night 2L via [place],  and uses supplements during the day when walking around neighborhood and goes out to appointments/ store that he has to walk a distance   OSA (obstructive sleep apnea)    study in epic 03-18-2005 mild to moderate   (11-07-2019  per pt used cpap up until when he lost alot of weight, stoped approx. 2012   Prostate cancer [facility]) urologist--- dr pace/  oncology-- dr Tammi Klippel   dx 03/ 2021   Pulmonary fibrosis ([place]) 09/2018   pulmonologist-- dr Vaughan Browner;   post covid 19   Seizure ([place])    1st in 1976; last in 1978 GTC x 2    SOB (shortness of breath)  on exertion    11-07-2019  per pt walks around neighborhood for 30 minutes twice daily, he does have to stop frequently d/t sob and since weather has been hot he uses his supplemental oxygen;  stated he has a pulse ox. at home , when he is sitting down O2 sat 96 -97% on RA when up walking around the house O2 sat 93-94% on RA;  pt stated he carry's his O2 w/ him when he goes to doctor appts/store d/t distance   Wears glasses    Wears hearing aid in both ears      Family History  Problem Relation Age of Onset   Hearing loss Mother    Diabetes Father    Heart disease Father    Heart attack Father 84   Hypertension Father    Cancer Sister        1 sister had cancer but patient unsure of type   Eczema Sister    Hypertension Sister    Thyroid disease Sister    Hyperlipidemia Other    Seizures Other    Colon cancer Neg Hx    Stroke Neg Hx    Breast cancer Neg Hx    Pancreatic cancer Neg Hx    Prostate cancer Neg Hx      Social History   Socioeconomic History   Marital status: Married    Spouse name: Not on file   Number of children: 2   Years of education: 12   Highest education level: Not on file  Occupational History   Occupation: Retired  Tobacco Use   Smoking status: Former    Packs/day: 3.00    Years: 20.00    Total pack years: 60.00    Types: Cigarettes    Quit date: 04/25/1969    Years since quitting: 52.8   Smokeless tobacco: Former    Types: Chew    Quit date: 11/07/1983  Vaping Use   Vaping Use: Never used  Substance and Sexual Activity   Alcohol use: Yes    Comment: RARE   Drug use: Never   Sexual activity: Not Currently    Comment: SHIM 1. Reports erectile dysfunction.;    Other Topics Concern   Not on file  Social History Narrative   ** Merged History Encounter **   Lives at home w/ his wife   Right-handed   Caffeine: Dr Malachi Bonds 16 oz/week   Social Determinants of Health   Financial Resource Strain: Not on file  Food Insecurity: Not on file   Transportation Needs: Not on file  Physical Activity: Not on file  Stress: Not on file  Social Connections: Not on file  Intimate Partner Violence: Not on file     Allergies  Allergen  Reactions   Ramipril Cough    Other reaction(s): Cough (ALLERGY/intolerance)     Outpatient Medications Prior to Visit  Medication Sig Dispense Refill   acyclovir (ZOVIRAX) 200 MG capsule Take 200 mg by mouth 2 (two) times daily.     apixaban (ELIQUIS) 5 MG TABS tablet Take 5 mg by mouth 2 (two) times daily.     Ascorbic Acid (VITAMIN C) 1000 MG tablet Take 1,000 mg by mouth 2 (two) times daily.     atorvastatin (LIPITOR) 40 MG tablet Take 40 mg by mouth at bedtime.      b complex vitamins capsule Take 1 capsule by mouth daily.     carvedilol (COREG) 6.25 MG tablet Take 6.25 mg by mouth every morning. In mornings and 1/2 tab at bedtime     Cholecalciferol 25 MCG (1000 UT) tablet Take 1,000 Units by mouth 2 (two) times a day.      ezetimibe (ZETIA) 10 MG tablet Take 1 tablet by mouth daily.     fluticasone (FLONASE) 50 MCG/ACT nasal spray Place 2 sprays into both nostrils daily. 16 g 5   folic acid (FOLVITE) 1 MG tablet Take 1 mg by mouth at bedtime.      furosemide (LASIX) 40 MG tablet Take 40 mg by mouth daily.      Ipratropium-Albuterol (COMBIVENT RESPIMAT) 20-100 MCG/ACT AERS respimat Inhale 1 puff into the lungs every 6 (six) hours as needed for wheezing or shortness of breath.     isosorbide mononitrate (IMDUR) 15 mg TB24 24 hr tablet Take 15 mg by mouth daily.     Multiple Vitamin (MULTI-VITAMINS) TABS Take 1 tablet by mouth at bedtime.     mupirocin ointment (BACTROBAN) 2 % Apply to skin lesion TID     nitroGLYCERIN (NITROSTAT) 0.4 MG SL tablet DISSOLVE ONE TABLET UNDER TONGUE EVERY 5 MINUTES AS NEEDED UP TO 3 DOSES, IF NORELIEF CALL 911 25 tablet 8   pantoprazole (PROTONIX) 40 MG tablet Take 40 mg by mouth daily.      potassium chloride SA (KLOR-CON) 20 MEQ tablet Take 20 mEq by mouth daily.      tamsulosin (FLOMAX) 0.4 MG CAPS capsule Take 0.4 mg by mouth daily.     vitamin B-12 (CYANOCOBALAMIN) 500 MCG tablet Take 500 mcg by mouth daily.     Lancets (ONETOUCH DELICA PLUS [id number]) MISC SMARTSIG:Via Meter 2-4 Times Daily     ONETOUCH VERIO test strip SMARTSIG:Via Meter 2-4 Times Daily     predniSONE (DELTASONE) 1 MG tablet Take 3 mg by mouth daily with breakfast.     No facility-administered medications prior to visit.    Review of Systems  Constitutional:  Negative for chills, fever, malaise/fatigue and weight loss.  HENT:  Negative for congestion, sinus pain and sore throat.   Eyes: Negative.   Respiratory:  Positive for cough and shortness of breath. Negative for hemoptysis and sputum production.   Cardiovascular:  Negative for chest pain, palpitations, orthopnea, claudication and leg swelling.  Gastrointestinal:  Negative for abdominal pain, heartburn, nausea and vomiting.  Genitourinary: Negative.   Musculoskeletal:  Negative for joint pain and myalgias.  Skin:  Negative for rash.  Neurological:  Negative for weakness.  Endo/Heme/Allergies: Negative.   Psychiatric/Behavioral: Negative.     Objective:   Vitals:   02/28/22 1449  BP: (!) 104/58  Pulse: 69  Temp: 97.8 F (36.6 C)  TempSrc: Oral  SpO2: 96%  Weight: 202 lb 9.6 oz (91.9 kg)  Height: '5\' 11"'$  (1.803  m)   Physical Exam Constitutional:      General: He is not in acute distress. HENT:     Head: Normocephalic and atraumatic.  Eyes:     Extraocular Movements: Extraocular movements intact.     Conjunctiva/sclera: Conjunctivae normal.     Pupils: Pupils are equal, round, and reactive to light.  Cardiovascular:     Rate and Rhythm: Normal rate and regular rhythm.     Pulses: Normal pulses.     Heart sounds: Murmur heard.  Pulmonary:     Effort: Pulmonary effort is normal.     Breath sounds: Rales (basilar) present.  Abdominal:     General: Bowel sounds are normal.     Palpations: Abdomen is soft.   Musculoskeletal:     Right lower leg: No edema.     Left lower leg: No edema.  Lymphadenopathy:     Cervical: No cervical adenopathy.  Skin:    General: Skin is warm and dry.  Neurological:     General: No focal deficit present.     Mental Status: He is alert.  Psychiatric:        Mood and Affect: Mood normal.        Behavior: Behavior normal.        Thought Content: Thought content normal.        Judgment: Judgment normal.    CBC    Component Value Date/Time   WBC 9.8 05/27/2021 1136   WBC 9.4 12/25/2020 1044   RBC 4.67 05/27/2021 1136   HGB 15.5 05/27/2021 1136   HGB 15.8 11/03/2020 1614   HGB 11.8 (L) 01/30/2017 1329   HCT 45.7 05/27/2021 1136   HCT 47.9 11/03/2020 1614   HCT 34.5 (L) 01/30/2017 1329   PLT 127 (L) 05/27/2021 1136   PLT 126 (L) 11/03/2020 1614   MCV 97.9 05/27/2021 1136   MCV 95 11/03/2020 1614   MCV 98.9 (H) 01/30/2017 1329   MCH 33.2 05/27/2021 1136   MCHC 33.9 05/27/2021 1136   RDW 13.0 05/27/2021 1136   RDW 15.0 11/03/2020 1614   RDW 13.9 01/30/2017 1329   LYMPHSABS 1.1 05/27/2021 1136   LYMPHSABS 1.4 11/03/2020 1614   LYMPHSABS 1.0 01/30/2017 1329   MONOABS 0.5 05/27/2021 1136   MONOABS 0.8 01/30/2017 1329   EOSABS 0.1 05/27/2021 1136   EOSABS 0.0 11/03/2020 1614   BASOSABS 0.0 05/27/2021 1136   BASOSABS 0.1 11/03/2020 1614   BASOSABS 0.0 01/30/2017 1329     Chest imaging: HRCT Chest 05/11/21 1. The appearance of the lungs is compatible with interstitial lung disease, with a spectrum of findings technically categorized as probable usual interstitial pneumonia (UIP) per current ATS guidelines. However, given the evolution in appearance on prior CT examinations dating back to October 29, 2018, and more normal appearance on remote prior CT examination dating back to November 16, 2016, findings are favored to reflect sequela of cryptogenic organizing pneumonia, likely from prior COVID infection. 2. Aortic atherosclerosis, in addition to left  main and three-vessel coronary artery disease. Assessment for potential risk factor modification, dietary therapy or pharmacologic therapy may be warranted, if clinically indicated. 3. There are severe calcifications of the aortic valve. Echocardiographic correlation for evaluation of potential valvular dysfunction may be warranted if clinically indicated. 4. Hepatic steatosis with evidence of hepatic cirrhosis.   PFT:    Latest Ref Rng & Units 01/31/2020   11:59 AM 11/01/2019    9:47 AM 04/03/2019   10:50 AM  PFT Results  FVC-Pre L 3.15  3.19  3.14   FVC-Predicted Pre % 77  77  76   FVC-Post L 3.11  3.23  3.11   FVC-Predicted Post % 76  78  75   Pre FEV1/FVC % % 84  86  87   Post FEV1/FCV % % 84  87  87   FEV1-Pre L 2.64  2.74  2.73   FEV1-Predicted Pre % 90  92  91   FEV1-Post L 2.62  2.82  2.69   DLCO uncorrected ml/min/mmHg 10.40  15.03  10.94   DLCO UNC% % 42  61  44   DLCO corrected ml/min/mmHg  14.94    DLCO COR %Predicted %  60    DLVA Predicted % 62  81  67   TLC L 4.41  5.22  4.29   TLC % Predicted % 63  75  61   RV % Predicted % 41  56  48   PFT 2021: Mild restriction and severe diffusion defect  Labs:  Path:  Echo 05/31/21: LV EF 60-65%. Moderate LVH. Grade I diastolic dysfunction. RV systolic function is normal. RV size is normal. Moderate aortic valve stenosis.   Heart Catheterization:       Assessment & Plan:   Pulmonary fibrosis (HCC) - Plan: CANCELED: Pulmonary Function Test  Discussion: Jayron Maqueda is a 81 year old male, former smoker with AL amyloidosis with nephrotic syndrome, hypertension, moderate nonobstructive CAD by cath 2011, aortic stenosis, GERD, hyperlipidemia, and carotid artery disease who returns to pulmonary clinic for dyspnea.   He has pulmonary fibrosis based on HRCT chest scans that remains stable over the past 2 years.   We discussed anti-fibrotic therapy and their side effect profiles at last visit. I recommended against  starting this therapy based on the side effects/risks of the medications compared to the benefits since his disease has been stable over recent years.   On top of pulmonary fibrosis he has moderate aortic stenosis, deconditioning and weight gain over the last year that are all contributing to his dyspnea.   Heis to continue CPAP with supplemental oxygen at night. He has not qualified for oxygen based on simple walks in the past.  Follow up in 6 months.  Freda Jackson, MD [facility] Office: [phone]   Current Outpatient Medications:    acyclovir (ZOVIRAX) 200 MG capsule, Take 200 mg by mouth 2 (two) times daily., Disp: , Rfl:    apixaban (ELIQUIS) 5 MG TABS tablet, Take 5 mg by mouth 2 (two) times daily., Disp: , Rfl:    Ascorbic Acid (VITAMIN C) 1000 MG tablet, Take 1,000 mg by mouth 2 (two) times daily., Disp: , Rfl:    atorvastatin (LIPITOR) 40 MG tablet, Take 40 mg by mouth at bedtime. , Disp: , Rfl:    b complex vitamins capsule, Take 1 capsule by mouth daily., Disp: , Rfl:    carvedilol (COREG) 6.25 MG tablet, Take 6.25 mg by mouth every morning. In mornings and 1/2 tab at bedtime, Disp: , Rfl:    Cholecalciferol 25 MCG (1000 UT) tablet, Take 1,000 Units by mouth 2 (two) times a day. , Disp: , Rfl:    ezetimibe (ZETIA) 10 MG tablet, Take 1 tablet by mouth daily., Disp: , Rfl:    fluticasone (FLONASE) 50 MCG/ACT nasal spray, Place 2 sprays into both nostrils daily., Disp: 16 g, Rfl: 5   folic acid (FOLVITE) 1 MG tablet, Take 1 mg by mouth at  bedtime. , Disp: , Rfl:    furosemide (LASIX) 40 MG tablet, Take 40 mg by mouth daily. , Disp: , Rfl:    Ipratropium-Albuterol (COMBIVENT RESPIMAT) 20-100 MCG/ACT AERS respimat, Inhale 1 puff into the lungs every 6 (six) hours as needed for wheezing or shortness of breath., Disp: , Rfl:    isosorbide mononitrate (IMDUR) 15 mg TB24 24 hr tablet, Take 15 mg by mouth daily., Disp: , Rfl:    Multiple Vitamin  (MULTI-VITAMINS) TABS, Take 1 tablet by mouth at bedtime., Disp: , Rfl:    mupirocin ointment (BACTROBAN) 2 %, Apply to skin lesion TID, Disp: , Rfl:    nitroGLYCERIN (NITROSTAT) 0.4 MG SL tablet, DISSOLVE ONE TABLET UNDER TONGUE EVERY 5 MINUTES AS NEEDED UP TO 3 DOSES, IF NORELIEF CALL 911, Disp: 25 tablet, Rfl: 8   pantoprazole (PROTONIX) 40 MG tablet, Take 40 mg by mouth daily. , Disp: , Rfl:    potassium chloride SA (KLOR-CON) 20 MEQ tablet, Take 20 mEq by mouth daily., Disp: , Rfl:    tamsulosin (FLOMAX) 0.4 MG CAPS capsule, Take 0.4 mg by mouth daily., Disp: , Rfl:    vitamin B-12 (CYANOCOBALAMIN) 500 MCG tablet, Take 500 mcg by mouth daily., Disp: , Rfl:

## 2022-03-02 ENCOUNTER — Encounter: Payer: Self-pay | Admitting: Pulmonary Disease

## 2022-03-03 ENCOUNTER — Telehealth: Payer: Self-pay | Admitting: Pulmonary Disease

## 2022-03-03 ENCOUNTER — Encounter: Payer: Self-pay | Admitting: *Deleted

## 2022-03-03 NOTE — Telephone Encounter (Signed)
Alex [name],  Please be advised on paperwork that patient has dropped off for Dr Erin Fulling. Paperwork was given to leslie by the patient.   Please be aware

## 2022-03-21 ENCOUNTER — Encounter: Payer: Self-pay | Admitting: Pulmonary Disease

## 2022-04-01 ENCOUNTER — Other Ambulatory Visit: Payer: Self-pay

## 2022-04-01 ENCOUNTER — Emergency Department (HOSPITAL_COMMUNITY): Payer: Medicare Other

## 2022-04-01 ENCOUNTER — Encounter (HOSPITAL_COMMUNITY): Payer: Self-pay

## 2022-04-01 ENCOUNTER — Emergency Department (HOSPITAL_COMMUNITY)
Admission: EM | Admit: 2022-04-01 | Discharge: 2022-04-01 | Disposition: A | Discharge status: Home / Self Care | Payer: Medicare Other | Attending: Emergency Medicine | Admitting: Emergency Medicine

## 2022-04-01 DIAGNOSIS — R2681 Unsteadiness on feet: Secondary | ICD-10-CM | POA: Insufficient documentation

## 2022-04-01 DIAGNOSIS — I1310 Hypertensive heart and chronic kidney disease without heart failure, with stage 1 through stage 4 chronic kidney disease, or unspecified chronic kidney disease: Secondary | ICD-10-CM | POA: Insufficient documentation

## 2022-04-01 DIAGNOSIS — E119 Type 2 diabetes mellitus without complications: Secondary | ICD-10-CM | POA: Insufficient documentation

## 2022-04-01 DIAGNOSIS — R0789 Other chest pain: Secondary | ICD-10-CM | POA: Diagnosis present

## 2022-04-01 DIAGNOSIS — I209 Angina pectoris, unspecified: Secondary | ICD-10-CM | POA: Diagnosis not present

## 2022-04-01 DIAGNOSIS — N183 Chronic kidney disease, stage 3 unspecified: Secondary | ICD-10-CM | POA: Insufficient documentation

## 2022-04-01 DIAGNOSIS — Z658 Other specified problems related to psychosocial circumstances: Secondary | ICD-10-CM | POA: Insufficient documentation

## 2022-04-01 DIAGNOSIS — R0602 Shortness of breath: Secondary | ICD-10-CM | POA: Insufficient documentation

## 2022-04-01 DIAGNOSIS — Z7952 Long term (current) use of systemic steroids: Secondary | ICD-10-CM | POA: Insufficient documentation

## 2022-04-01 LAB — CBC WITH DIFFERENTIAL/PLATELET
Abs Immature Granulocytes: 0.04 10*3/uL (ref 0.00–0.07)
Basophils Absolute: 0.1 10*3/uL (ref 0.0–0.1)
Basophils Relative: 1 %
Eosinophils Absolute: 0.2 10*3/uL (ref 0.0–0.5)
Eosinophils Relative: 2 %
HCT: 48.7 % (ref 39.0–52.0)
Hemoglobin: 15.9 g/dL (ref 13.0–17.0)
Immature Granulocytes: 0 %
Lymphocytes Relative: 19 %
Lymphs Abs: 1.9 10*3/uL (ref 0.7–4.0)
MCH: 29.7 pg (ref 26.0–34.0)
MCHC: 32.6 g/dL (ref 30.0–36.0)
MCV: 91 fL (ref 80.0–100.0)
Monocytes Absolute: 0.8 10*3/uL (ref 0.1–1.0)
Monocytes Relative: 8 %
Neutro Abs: 7.3 10*3/uL (ref 1.7–7.7)
Neutrophils Relative %: 70 %
Platelets: 148 10*3/uL — ABNORMAL LOW (ref 150–400)
RBC: 5.35 MIL/uL (ref 4.22–5.81)
RDW: 13.9 % (ref 11.5–15.5)
WBC: 10.3 10*3/uL (ref 4.0–10.5)
nRBC: 0 % (ref 0.0–0.2)

## 2022-04-01 LAB — COMPREHENSIVE METABOLIC PANEL
ALT: 47 U/L — ABNORMAL HIGH (ref 0–44)
AST: 49 U/L — ABNORMAL HIGH (ref 15–41)
Albumin: 3.6 g/dL (ref 3.5–5.0)
Alkaline Phosphatase: 66 U/L (ref 38–126)
Anion gap: 10 (ref 5–15)
BUN: 25 mg/dL — ABNORMAL HIGH (ref 8–23)
CO2: 22 mmol/L (ref 22–32)
Calcium: 9.1 mg/dL (ref 8.9–10.3)
Chloride: 108 mmol/L (ref 98–111)
Creatinine, Ser: 1.23 mg/dL (ref 0.61–1.24)
GFR, Estimated: 60 mL/min — ABNORMAL LOW (ref >60)
Glucose, Bld: 110 mg/dL — ABNORMAL HIGH (ref 70–99)
Potassium: 3.6 mmol/L (ref 3.5–5.1)
Sodium: 140 mmol/L (ref 135–145)
Total Bilirubin: 1.1 mg/dL (ref 0.3–1.2)
Total Protein: 6.2 g/dL — ABNORMAL LOW (ref 6.5–8.1)

## 2022-04-01 LAB — MAGNESIUM: Magnesium: 2.1 mg/dL (ref 1.7–2.4)

## 2022-04-01 LAB — TROPONIN I (HIGH SENSITIVITY)
Troponin I (High Sensitivity): 7 ng/L (ref <18)
Troponin I (High Sensitivity): 5 ng/L (ref <18)

## 2022-04-01 MED ORDER — IOHEXOL 350 MG/ML SOLN
100.0000 mL | Freq: Once | INTRAVENOUS | Status: AC | PRN
  Administered 2022-04-01: 100 mL via INTRAVENOUS

## 2022-04-01 NOTE — ED Notes (Signed)
Patient transported to CT 

## 2022-04-01 NOTE — ED Notes (Signed)
Pt discharge instructions given to patient and wife. Both understood follow up care and future appointment. No further questions

## 2022-04-01 NOTE — ED Notes (Signed)
ED Provider at bedside. 

## 2022-04-01 NOTE — ED Triage Notes (Signed)
PT start having chest pain today at 1020, center chest, sharp irradiating to back and left arm. Pt is chronic short of breath due pulmonary fibrosis wearing 2L [place] as baseline; stated: "no different than usual".  Aox4, [facility] gave him 324 aspirin and one dose of nitro which relief his pain from 10 to 5. Pt still maintain pain but it has decreased to 4 out 10

## 2022-04-01 NOTE — ED Provider Notes (Signed)
[facility] EMERGENCY DEPARTMENT Provider Note   CSN: [id number] Arrival date & time: 04/01/22  1148     History  Chief Complaint  Patient presents with   Chest Pain    Alex [name] Alex [name]. is a 81 y.o. male with HTN, HLD, CAD with stable angina, GERD, seizures, AL amyloidosis with nephrotic syndrome, history of multiple myeloma complicated by engraftment syndrome following autologous stem cell transplant, prostate cancer, pulmonary fibrosis, history of PE on Eliquis, OSA on CPAP, temporal arteritis, A-fib, chronic post COVID syndrome, T2DM, stage III CKD who presents with chest pain.   Patient presents with acute onset chest pain and 'indigestion' at 1020 AM that is in the center of his chest, sharp, radiating to back and his left upper arm.  No h/o similar. He wears 2 L nasal cannula at baseline for chronic pulmonary fibrosis which he states is not any different than usual.  He received 324 mg baby aspirin from EMS and 1 dose of nitro which relieved his pain from 10/10 to 5/10. Denies fever/chills, recent illnesses, abdominal pain, urinary symptoms, diarrhea/constipation, numbness/tingling, asymmetric weakness.  Endorses cough and shortness of breath but nothing more than his baseline.  Per chart review patient was last seen on 12/17/2021 by cardiology.  Last echo February 2023 with LV EF 60 to 65%,G1 DD, moderate LVH, moderate to severe AS.  Was seen on 02/28/2022 by pulmonology for his pulmonary fibrosis at which time he reported stable dyspnea with no acute complaints.   Chest Pain      Home Medications Prior to Admission medications   Medication Sig Start Date End Date Taking? Authorizing Provider  acyclovir (ZOVIRAX) 200 MG capsule Take 200 mg by mouth 2 (two) times daily.    [provider]  apixaban (ELIQUIS) 5 MG TABS tablet Take 5 mg by mouth 2 (two) times daily.    [provider]  Ascorbic Acid (VITAMIN C) 1000 MG tablet Take 1,000 mg  by mouth 2 (two) times daily.    [provider]  atorvastatin (LIPITOR) 40 MG tablet Take 40 mg by mouth at bedtime.     [provider]  b complex vitamins capsule Take 1 capsule by mouth daily.    [provider]  carvedilol (COREG) 6.25 MG tablet Take 6.25 mg by mouth every morning. In mornings and 1/2 tab at bedtime    [provider]  Cholecalciferol 25 MCG (1000 UT) tablet Take 1,000 Units by mouth 2 (two) times a day.     [provider]  ezetimibe (ZETIA) 10 MG tablet Take 1 tablet by mouth daily. 10/28/15   [provider]  fluticasone (FLONASE) 50 MCG/ACT nasal spray Place 2 sprays into both nostrils daily. 08/28/19   Mannam, Hart Robinsons, MD  folic acid (FOLVITE) 1 MG tablet Take 1 mg by mouth at bedtime.  04/24/17   [provider]  furosemide (LASIX) 40 MG tablet Take 40 mg by mouth daily.     [provider]  Ipratropium-Albuterol (COMBIVENT RESPIMAT) 20-100 MCG/ACT AERS respimat Inhale 1 puff into the lungs every 6 (six) hours as needed for wheezing or shortness of breath.    [provider]  isosorbide mononitrate (IMDUR) 15 mg TB24 24 hr tablet Take 15 mg by mouth daily.    [provider]  Multiple Vitamin (MULTI-VITAMINS) TABS Take 1 tablet by mouth at bedtime. 04/24/17   [provider]  mupirocin ointment (BACTROBAN) 2 % Apply to skin lesion TID 04/24/18  [provider]  nitroGLYCERIN (NITROSTAT) 0.4 MG SL tablet DISSOLVE ONE TABLET UNDER TONGUE EVERY 5 MINUTES AS NEEDED UP TO 3 DOSES, IF NORELIEF CALL 911 12/29/20   Burnell Blanks, MD  pantoprazole (PROTONIX) 40 MG tablet Take 40 mg by mouth daily.  08/28/18   [provider]  potassium chloride SA (KLOR-CON) 20 MEQ tablet Take 20 mEq by mouth daily.    [provider]  tamsulosin (FLOMAX) 0.4 MG CAPS capsule Take 0.4 mg by mouth daily.    [provider]  vitamin B-12 (CYANOCOBALAMIN) 500 MCG  tablet Take 500 mcg by mouth daily.    [provider]      Allergies    Ramipril    Review of Systems   Review of Systems  Cardiovascular:  Positive for chest pain.   Review of systems Negative for f/c.  A 10 point review of systems was performed and is negative unless otherwise reported in HPI.  Physical Exam Updated Vital Signs BP (!) 148/98   Pulse 73   Temp 97.9 F (36.6 C) (Oral)   Resp 14   Ht _0  (1.803 m)   Wt 88.5 kg   SpO2 100%   BMI 27.20 kg/m  Physical Exam General: Normal appearing male, lying in bed.  HEENT: PERRLA, Sclera anicteric, MMM, trachea midline.  Cardiology: RRR, no murmurs/rubs/gallops. BL radial and DP pulses equal bilaterally.  Resp: Normal respiratory rate and effort. CTAB, no wheezes, rhonchi, crackles. On 2L [place].  Abd: Soft, non-tender, non-distended. No rebound tenderness or guarding.  GU: Deferred. MSK: No peripheral edema or signs of trauma. Extremities without deformity or TTP. No cyanosis or clubbing. Skin: warm, dry. No rashes or lesions. Neuro: A&Ox4, CNs II-XII grossly intact. MAEs. Sensation grossly intact.  Psych: Normal mood and affect.   ED Results / Procedures / Treatments   Labs (all labs ordered are listed, but only abnormal results are displayed) Labs Reviewed  CBC WITH DIFFERENTIAL/PLATELET - Abnormal; Notable for the following components:      Result Value   Platelets 148 (*)    All other components within normal limits  COMPREHENSIVE METABOLIC PANEL - Abnormal; Notable for the following components:   Glucose, Bld 110 (*)    BUN 25 (*)    Total Protein 6.2 (*)    AST 49 (*)    ALT 47 (*)    GFR, Estimated 60 (*)    All other components within normal limits  MAGNESIUM  TROPONIN I (HIGH SENSITIVITY)  TROPONIN I (HIGH SENSITIVITY)    EKG None  Radiology DG Chest 1 View  Result Date: 04/01/2022 CLINICAL DATA:  Chest pain. EXAM: CHEST  1 VIEW COMPARISON:  September 26, 2021. FINDINGS: Stable  cardiomediastinal silhouette. Stable bibasilar interstitial densities are noted most consistent with scarring. No definite acute abnormality is noted. Bony thorax is unremarkable. IMPRESSION: Stable bibasilar opacities are noted most consistent scarring. No acute abnormality seen. Electronically Signed   By: Marijo Conception M.D.   On: 04/01/2022 12:48    Procedures Procedures    Medications Ordered in ED Medications - No data to display  ED Course/ Medical Decision Making/ A&P                          Medical Decision Making Amount and/or Complexity of Data Reviewed Labs: ordered. Decision-making details documented in ED Course. Radiology: ordered. Decision-making details documented in ED Course.  Risk Prescription drug management.  This patient presents to the ED for concern of chest pain, this involves an extensive number of treatment options, and is a complaint that carries with it a high risk of complications and morbidity.  I considered the following differential and admission for this acute, potentially life threatening condition.    MDM:    DDX for chest pain includes but is not limited to:  Consider ACS/arrhythmia especially given acute onset and report of relief w/ nitroglycerin. Did receive ASA. Consider unstable angina as well if w/u negative. No hypoxia/increased SOB to indicate PE. Given repot of radiation to back/left arm, and severity of pain, consider aortic dissection, pt has h/o HTN. Lower on differential but also consider pericarditis/myocarditis, PNA, PTX. No RUQ abdominal pain to indicate biliary disease but some LUQ TTP, consider gastritis/PUD/GERD, given report of 'indigestion.' Will obtain labs and CT dissection protocol to evaluate.   Clinical Course as of 04/14/22 0703  Fri Apr 01, 2022  1328 WBC: 10.3 [HN]  1328 Hemoglobin: 15.9 [HN]  1328 Platelets(!): 148 [HN]  1328 DG Chest 1 View FINDINGS: Stable cardiomediastinal silhouette. Stable bibasilar  interstitial densities are noted most consistent with scarring. No definite acute abnormality is noted. Bony thorax is unremarkable.  IMPRESSION: Stable bibasilar opacities are noted most consistent scarring. No acute abnormality seen.   [HN]  1356 Troponin I (High Sensitivity): 7 [HN]  1356 Mildly elevated AST/ALT which is chronic for patient and values c/w priors [HN]  2119 Lytes wnl [HN]  1444 CT Angio Chest/Abd/Pel for Dissection W and/or Wo Contrast 1. No evidence of thoracic or abdominal aortic dissection. 2. Focal dilatation of the infrarenal abdominal aorta measuring up to 3 cm, unchanged. Follow-up examination in 3 years is  recommended. 3. Focal dilatation of the celiac trunk at its bifurcation measuring up to 1.2 cm. No evidence of vasculitis or significant stenosis. 4. Coronary artery atherosclerotic calcifications. 5. Chronic interstitial lung disease without significant interval change or acute abnormality. 6. Colonic diverticulosis without evidence of acute diverticulitis. 7. Multilevel degenerate disc disease of the lumbar spine. 8. Additional chronic findings as above. [HN]  1500 With heart score of 7, will d/w cardiology [HN]    Clinical Course User Index [HN] Audley Hose, MD     Labs: I Ordered, and personally interpreted labs.  The pertinent results include those listed above  Imaging Studies ordered: I ordered imaging studies including CXR, CT dissection I independently visualized and interpreted imaging. I agree with the radiologist interpretation  Additional history obtained from wife at bedside and chart review.  Cardiac Monitoring: The patient was maintained on a cardiac monitor.  I personally viewed and interpreted the cardiac monitored which showed an underlying rhythm of: NSR  Reevaluation: After the interventions noted above, I reevaluated the patient and found that they have :improved  Social Determinants of Health: Patient lives  independently with wife  Disposition:  Labs reassuring. CT Dissection protocol with some unchanged dilations of vessels without any urgent or acute findings. Patient likely with angina. Discussed extensively with patient and his wife about taking nitroglycerin again as he did today if he has chest pain again and to come to the ED if it is severe, different/new, or does not go away. Due to heart score of 7, D/w cardiology who will follow patient closely in clinic. Patient will be discharged to home with close f/u with cardiology. Both wife and patient report understanding, all questions answered to their satisfaction, DC w/ discharge instructions/return precautions.  Co morbidities that complicate  the patient evaluation  Past Medical History:  Diagnosis Date   AL amyloidosis [facility]) oncologist-- dr [name]   Renal and Systemic , dx 04/ 2018;  renal bx 08-05-2016 and bone marrow bx 08-29-2016;  8 month chemotherapy then stell cell transplant 12/ 2018 then chemo again;  since then been on oral chemo daily (11-07-2019 per pt has been on hold until after prostate seed implants)   Arthritis    Atypical chest pain    CAD (coronary artery disease) cardiologist-- dr [name];  also followed by cardiologist w/ [facility] in [place], dr [name]   moderate nonobstrucive disease by cath 2011; Lexiscan Myoview 6/14--Normal study, no scar or ischemia, EF 62%;  11-14-2014 nuclear study low risk w/ normal perfusion, nuclear ef 67%   CKD (chronic kidney disease), stage III (HCC)    COVID    b/l PNA (7/20)- 21 day hospitalization tx with steroids, Remdesevir, Actemra, Hydroxychloroquine) --> PE on Eliquis.   Diverticulosis of colon    Dyspnea    WITH EXERTION    ED (erectile dysfunction)    GERD (gastroesophageal reflux disease)    H/O stem cell transplant ([place]) 03/2017   Headache    Heart murmur    Hiatal hernia    History of 2019 novel coronavirus disease (COVID-19) 09/2018   [facility],  hospital admission 10-28-2019 respiratory failure/ pneumometrostinom/ PE ;  was not intubated , treatment with remdesivir/ steroids/ actemra/ plasma   History of basal cell carcinoma (BCC)    History of colon polyps    History of MRSA infection    culture positive   History of pulmonary embolus (PE) 10/2018   with covid 19, completed treatment with eliquis   History of seizures    11-07-2019  per pt last one 1970s, unknown cause   History of vertebral compression fracture 1988   lumbar   Hx of adenomatous colonic polyps    negative in 7/08   Hyperlipidemia    Hypertension    IFG (impaired fasting glucose)    Neuropathy due to chemotherapeutic drug ([place])    fingers tips and feet   Nocturia    On supplemental oxygen therapy    post covid 19;   11-07-2019 pt uses O2 every night 2L via [place],  and uses supplements during the day when walking around neighborhood and goes out to appointments/ store that he has to walk a distance   OSA (obstructive sleep apnea)    study in epic 03-18-2005 mild to moderate   (11-07-2019  per pt used cpap up until when he lost alot of weight, stoped approx. 2012   Prostate cancer [facility]) urologist--- dr pace/  oncology-- dr Tammi Klippel   dx 03/ 2021   Pulmonary fibrosis ([place]) 09/2018   pulmonologist-- dr Vaughan Browner;   post covid 19   Seizure ([place])    1st in 1976; last in 1978 GTC x 2    SOB (shortness of breath) on exertion    11-07-2019  per pt walks around neighborhood for 30 minutes twice daily, he does have to stop frequently d/t sob and since weather has been hot he uses his supplemental oxygen;  stated he has a pulse ox. at home , when he is sitting down O2 sat 96 -97% on RA when up walking around the house O2 sat 93-94% on RA;  pt stated he carry's his O2 w/ him when he goes to doctor appts/store d/t distance   Wears glasses  Wears hearing aid in both ears      Medicines No orders of the defined types were placed in this encounter.   I have reviewed the  patients home medicines and have made adjustments as needed  Problem List / ED Course: Problem List Items Addressed This Visit   None Visit Diagnoses     Angina pectoris, unspecified ([place])    -  Primary                  This note was created using dictation software, which may contain spelling or grammatical errors.    Audley Hose, MD 04/17/22 [phone]

## 2022-04-01 NOTE — Discharge Instructions (Addendum)
Thank you for coming to [facility] Emergency Department. You were seen for chest pain. We did an exam, labs, and imaging, and these showed very reassuring workup. Your chest pain was concerning for angina or cardiac pain. We consulted with cardiology who recommended seeing them in clinic. Someone will contact you in the next couple of days. If they do not, please call them to schedule follow up.  Please follow up with your cardiologist, possibly for a stress test, within 1 week.   Do not hesitate to return to the ED or call 911 if you experience: -Worsening symptoms -Lightheadedness, passing out -Fevers/chills -Anything else that concerns you

## 2022-04-08 ENCOUNTER — Ambulatory Visit (HOSPITAL_BASED_OUTPATIENT_CLINIC_OR_DEPARTMENT_OTHER)
Admission: RE | Admit: 2022-04-08 | Discharge: 2022-04-08 | Disposition: A | Discharge status: Home / Self Care | Payer: No Typology Code available for payment source | Source: Ambulatory Visit | Attending: Pulmonary Disease | Admitting: Pulmonary Disease

## 2022-04-08 DIAGNOSIS — R0600 Dyspnea, unspecified: Secondary | ICD-10-CM

## 2022-04-09 NOTE — Progress Notes (Unsigned)
Cardiology Office Note:    Date:  04/13/2022   ID:  Alex Adler., DOB 1940-12-16, MRN [id number]  PCP:  Ginger Organ., MD   [facility] Providers Cardiologist:  [name], MD     Referring MD: Ginger Organ., MD   Chief Complaint: ER follow-up for chest pain  History of Present Illness:    Alex Adler. is a very pleasant 81 y.o. male with a hx of amyloidosis with nephrotic syndrome, multiple myeloma ,HTN, moderate nonobstructive CAD by cath 2011, aortic stenosis, GERD, temporal arteritis, hyperlipidemia, pulmonary fibrosis, and carotid artery disease.  Carotid artery Dopplers 04/05/2012 with mild bilateral disease. Stress Myoview July 2016 with no ischemia. Echo May 2018 in July 2016 with normal LV function, mild aortic stenosis.  Seen by Truitt Merle, NP March 2017 and reported occasional chest pain. Imdur was added and his chest pain improved.  He was diagnosed with amyloidosis in April 2018 treated with chemotherapy for 8 months then stem cell transplant December 2018 followed by chemotherapy.  He is on maintenance chemotherapy.  Echo September 2019 with LVEF 60 to 65%, mild to moderate AAS with mean gradient 19 mmHg, peak gradient 35 mmHg.  Admission July 2025 with complications from [id number].  He developed respiratory failure and was treated with remdesivir, steroids, Actemra, and plasma.  He was diagnosed with pneumoediastinum and was found to have a pulmonary embolism and placed on Eliquis.  Echo February 2022 with LVEF 60 to 65%, mild LVH.  Moderately severe aortic stenosis with mean gradient 17 mmHg, peak gradient 32 mmHg, AVA 1.19 cm, dimensionless index 0.24.  Stress test at the [facility] was normal spring 2022.  He was seen in our office by Dr. Tamala Julian 02/08/2021 with chest pain felt not to be cardiac related.  Was placed on prednisone for temporal arteritis 2023.  Most recent cardiology clinic visit 12/17/2021 with Dr. [name] at which time he  was overall feeling well.  Plan to repeat echocardiogram February 2024.  He was advised to return in 6 months for follow-up.  Today, he is here for hospital follow-up for ED visit 04/01/2022 for chest pain that felt sharp, radiating to his back and left upper arm. On that day, he went to his PCP office and EMS transported him to hospital. CXR revealed stable bibasilar opacities most consistent with scarring, no acute abnormality seen. Hs troponins were unremarkable. No acute change on EKG. Chest CTA with no evidence of thoracic or abdominal aortic dissection, chronic interstitial lung disease without significant interval change or acute abnormality, focal dilatation of the infrarenal abdominal aorta measuring up to 3 cm, unchanged, recommendation to follow-up in 3 years. He reports he has not had any return of chest pain since that time. Pain was most intense he had ever felt. Went from chest to back and upper part of his left arm. Reports DOE is chronic and stable, no orthopnea, PND, or edema. He walks for exercise but is limited by distance. No presyncope, syncope, palpitations. Is having shoulder pain, asks if he would be able to be cleared for surgery if it was recommended.   Past Medical History:  Diagnosis Date   AL amyloidosis [facility]) oncologist-- dr [name]   Renal and Systemic , dx 04/ 2018;  renal bx 08-05-2016 and bone marrow bx 08-29-2016;  8 month chemotherapy then stell cell transplant 12/ 2018 then chemo again;  since then been on oral chemo daily (11-07-2019 per pt has been on hold  until after prostate seed implants)   Arthritis    Atypical chest pain    CAD (coronary artery disease) cardiologist-- dr [name];  also followed by cardiologist w/ [facility] in [place], dr [name]   moderate nonobstrucive disease by cath 2011; Lexiscan Myoview 6/14--Normal study, no scar or ischemia, EF 62%;  11-14-2014 nuclear study low risk w/ normal perfusion, nuclear ef 67%   CKD (chronic kidney disease),  stage III (HCC)    COVID    b/l PNA (7/20)- 21 day hospitalization tx with steroids, Remdesevir, Actemra, Hydroxychloroquine) --> PE on Eliquis.   Diverticulosis of colon    Dyspnea    WITH EXERTION    ED (erectile dysfunction)    GERD (gastroesophageal reflux disease)    H/O stem cell transplant ([place]) 03/2017   Headache    Heart murmur    Hiatal hernia    History of 2019 novel coronavirus disease (COVID-19) 09/2018   [facility], hospital admission 10-28-2019 respiratory failure/ pneumometrostinom/ PE ;  was not intubated , treatment with remdesivir/ steroids/ actemra/ plasma   History of basal cell carcinoma (BCC)    History of colon polyps    History of MRSA infection    culture positive   History of pulmonary embolus (PE) 10/2018   with covid 19, completed treatment with eliquis   History of seizures    11-07-2019  per pt last one 1970s, unknown cause   History of vertebral compression fracture 1988   lumbar   Hx of adenomatous colonic polyps    negative in 7/08   Hyperlipidemia    Hypertension    IFG (impaired fasting glucose)    Neuropathy due to chemotherapeutic drug ([place])    fingers tips and feet   Nocturia    On supplemental oxygen therapy    post covid 19;   11-07-2019 pt uses O2 every night 2L via [place],  and uses supplements during the day when walking around neighborhood and goes out to appointments/ store that he has to walk a distance   OSA (obstructive sleep apnea)    study in epic 03-18-2005 mild to moderate   (11-07-2019  per pt used cpap up until when he lost alot of weight, stoped approx. 2012   Prostate cancer [facility]) urologist--- dr pace/  oncology-- dr Tammi Klippel   dx 03/ 2021   Pulmonary fibrosis ([place]) 09/2018   pulmonologist-- dr Vaughan Browner;   post covid 19   Seizure ([place])    1st in 1976; last in 1978 GTC x 2    SOB (shortness of breath) on exertion    11-07-2019  per pt walks around neighborhood for 30 minutes twice daily, he does have to  stop frequently d/t sob and since weather has been hot he uses his supplemental oxygen;  stated he has a pulse ox. at home , when he is sitting down O2 sat 96 -97% on RA when up walking around the house O2 sat 93-94% on RA;  pt stated he carry's his O2 w/ him when he goes to doctor appts/store d/t distance   Wears glasses    Wears hearing aid in both ears     Past Surgical History:  Procedure Laterality Date   ARTERY BIOPSY Right 09/17/2020   Procedure: RIGHT TEMPORAL ARTERY BIOPSY;  Surgeon: Armandina Gemma, MD;  Location: [facility];  Service: General;  Laterality: Right;   CARDIAC CATHETERIZATION  05-21-1999  and 12-29-2000  _0   dr Maryjo [place] nuclear  study;  moderate nonobstructive disease involving LAD and LCx;  second done due to in Reversal research trial   CARDIAC CATHETERIZATION  07/07/2009  dr [name]   nonobstructive disease w/ normal LVF   CARPAL TUNNEL RELEASE Left 1994   CERVICAL FUSION  1988   C2 -- 4   CYSTOSCOPY N/A 11/13/2019   Procedure: CYSTOSCOPY FLEXIBLE;  Surgeon: [name], MD;  Location: [facility];  Service: Urology;  Laterality: N/A;  no seeds detected per Dr. Claudia Desanctis   FOOT SURGERY Bilateral x3 last one 1980s   bunionectomy great toe--- x2 right and x1 left   KNEE ARTHROSCOPY Left 1996   NASAL SEPTUM SURGERY  x3 last one 08-09-2010 @ [place]   PAROTIDECTOMY Right 03/09/2017   RADIOACTIVE SEED IMPLANT N/A 11/13/2019   Procedure: RADIOACTIVE SEED IMPLANT/BRACHYTHERAPY IMPLANT;  Surgeon: [name], MD;  Location: [place];  Service: Urology;  Laterality: N/A;  ONLY NEEDS 90 MIN FOR ALL PROCEDURES   ROTATOR CUFF REPAIR Bilateral x1 rigth (unsure date);  x2 left last one 09-07-2009 _0 ;  right   SPACE OAR INSTILLATION N/A 11/13/2019   Procedure: SPACE OAR INSTILLATION;  Surgeon: [name], MD;  Location: [facility];  Service: Urology;  Laterality: N/A;   TRIGGER FINGER RELEASE Bilateral x2  last one 2001  approx.   x1 finger left hand;  x2 fingers right hand   UMBILICAL HERNIA REPAIR  01/19/2011   VASECTOMY  approx. 1968 (with anesthesia)    Current Medications: Current Meds  Medication Sig   acyclovir (ZOVIRAX) 200 MG capsule Take 200 mg by mouth 2 (two) times daily.   albuterol (ACCUNEB) 1.25 MG/3ML nebulizer solution Take 1 ampule by nebulization every 6 (six) hours as needed for shortness of breath or wheezing.   apixaban (ELIQUIS) 5 MG TABS tablet Take 5 mg by mouth 2 (two) times daily.   Ascorbic Acid (VITAMIN C) 1000 MG tablet Take 1,000 mg by mouth 2 (two) times daily.   atorvastatin (LIPITOR) 40 MG tablet Take 40 mg by mouth at bedtime.    azithromycin (ZITHROMAX) 250 MG tablet Take 250 mg by mouth daily.   b complex vitamins capsule Take 1 capsule by mouth daily.   capsaicin (ZOSTRIX) 0.025 % cream Apply 1 Application topically as needed.   carvedilol (COREG) 6.25 MG tablet Take 6.25 mg by mouth every morning. In mornings and 1/2 tab at bedtime   Cholecalciferol 25 MCG (1000 UT) tablet Take 1,000 Units by mouth 2 (two) times a day.    ezetimibe (ZETIA) 10 MG tablet Take 1 tablet by mouth daily.   fluticasone (FLONASE) 50 MCG/ACT nasal spray Place 2 sprays into both nostrils daily.   folic acid (FOLVITE) 1 MG tablet Take 1 mg by mouth at bedtime.    furosemide (LASIX) 40 MG tablet Take 40 mg by mouth daily.    Ipratropium-Albuterol (COMBIVENT RESPIMAT) 20-100 MCG/ACT AERS respimat Inhale 1 puff into the lungs every 6 (six) hours as needed for wheezing or shortness of breath.   isosorbide mononitrate (IMDUR) 15 mg TB24 24 hr tablet Take 15 mg by mouth daily.   lidocaine (LIDODERM) 5 % Place 1 patch onto the skin as needed.   Multiple Vitamin (MULTI-VITAMINS) TABS Take 1 tablet by mouth at bedtime.   mupirocin ointment (BACTROBAN) 2 % Apply to skin lesion TID   nitroGLYCERIN (NITROSTAT) 0.4 MG SL tablet DISSOLVE ONE TABLET UNDER TONGUE EVERY 5 MINUTES AS NEEDED UP TO 3 DOSES,  IF  NORELIEF CALL 911   pantoprazole (PROTONIX) 40 MG tablet Take 40 mg by mouth daily.    potassium chloride SA (KLOR-CON) 20 MEQ tablet Take 20 mEq by mouth daily.   tamsulosin (FLOMAX) 0.4 MG CAPS capsule Take 0.4 mg by mouth daily.   triamcinolone cream (KENALOG) 0.1 % Apply 1 Application topically as needed.   vitamin B-12 (CYANOCOBALAMIN) 500 MCG tablet Take 500 mcg by mouth daily.     Allergies:   Ramipril and Amoxicillin-pot clavulanate   Social History   Socioeconomic History   Marital status: Married    Spouse name: Not on file   Number of children: 2   Years of education: 12   Highest education level: Not on file  Occupational History   Occupation: Retired  Tobacco Use   Smoking status: Former    Packs/day: 3.00    Years: 20.00    Total pack years: 60.00    Types: Cigarettes    Quit date: 04/25/1969    Years since quitting: 53.0   Smokeless tobacco: Former    Types: Chew    Quit date: 11/07/1983  Vaping Use   Vaping Use: Never used  Substance and Sexual Activity   Alcohol use: Yes    Comment: RARE   Drug use: Never   Sexual activity: Not Currently    Comment: SHIM 1. Reports erectile dysfunction.;    Other Topics Concern   Not on file  Social History Narrative   ** Merged History Encounter **   Lives at home w/ his wife   Right-handed   Caffeine: Dr Malachi Bonds 16 oz/week   Social Determinants of Health   Financial Resource Strain: Not on file  Food Insecurity: Not on file  Transportation Needs: Not on file  Physical Activity: Not on file  Stress: Not on file  Social Connections: Not on file     Family History: The patient's family history includes Cancer in his sister; Diabetes in his father; Eczema in his sister; Hearing loss in his mother; Heart attack (age of onset: 1) in his father; Heart disease in his father; Hyperlipidemia in an other family member; Hypertension in his father and sister; Seizures in an other family member; Thyroid disease in his  sister. There is no history of Colon cancer, Stroke, Breast cancer, Pancreatic cancer, or Prostate cancer.  ROS:   Please see the history of present illness.   All other systems reviewed and are negative.  Labs/Other Studies Reviewed:    The following studies were reviewed today:  Echo 05/31/21  1. Left ventricular ejection fraction, by estimation, is 60 to 65%. The  left ventricle has normal function. The left ventricle has no regional  wall motion abnormalities. There is moderate left ventricular hypertrophy.  Left ventricular diastolic  parameters are consistent with Grade I diastolic dysfunction (impaired  relaxation).   2. Right ventricular systolic function is normal. The right ventricular  size is normal. Tricuspid regurgitation signal is inadequate for assessing  PA pressure.   3. The mitral valve is normal in structure. No evidence of mitral valve  regurgitation. No evidence of mitral stenosis.   4. The aortic valve was not well visualized. There is moderate  calcification of the aortic valve. Aortic valve regurgitation is mild.  Moderate aortic valve stenosis. Vmax 2.8 m/s, MG 56mHg, AVA 1.2 cm^2, DI  0.32   [facility]  Recent Labs: 04/01/2022: ALT 47; BUN 25; Creatinine, Ser 1.23; Hemoglobin 15.9; Magnesium 2.1;  Platelets 148; Potassium 3.6; Sodium 140  Recent Lipid Panel    Risk Assessment/Calculations:      Physical Exam:    VS:  BP 108/86   Pulse 84   Ht _0  (1.803 m)   Wt 191 lb (86.6 kg)   SpO2 97%   BMI 26.64 kg/m     Wt Readings from Last 3 Encounters:  04/13/22 191 lb (86.6 kg)  04/01/22 195 lb (88.5 kg)  02/28/22 202 lb 9.6 oz (91.9 kg)     GEN:  Well nourished, well developed in no acute distress HEENT: Normal NECK: No JVD; No carotid bruits CARDIAC: Distant RRR, systolic murmur. No rubs, gallops RESPIRATORY:  Clear to auscultation without rales, wheezing or rhonchi  ABDOMEN: Soft, non-tender,  non-distended MUSCULOSKELETAL:  No edema; No deformity. 2+ pedal pulses, equal bilaterally SKIN: Warm and dry NEUROLOGIC:  Alert and oriented x 3 PSYCHIATRIC:  Normal affect   EKG:  EKG is not ordered today.     Diagnoses:    1. Precordial pain   2. Coronary artery disease involving native coronary artery of native heart without angina pectoris   3. Nonrheumatic aortic valve stenosis   4. Essential hypertension   5. AL amyloidosis (HCC)    Assessment and Plan:     Chest pain: One episode of severe chest pain on 12/8, with reassuring workup in ED. No further episodes since that date. Has not felt any change in chronic DOE. Advised him to notify us if symptoms reoccur prior to next office visit.   CAD without angina: Mild nonobstructive CAD on cath 01/2020 at [facility]. One episode of recent chest pain as described above. Advised him to notify us if symptoms return. Lipids monitored by PCP. Continue atorvastatin, carvedilol, ezetimibe, isosorbide.   Aortic stenosis: Moderate to severe AS with mean gradient 15 mmHg, AVA 1.2 cm, normal LVEF by echo February 2023. Has chronic DOE that he feels is stable. No prolonged chest pain, no presyncope, syncope, palpitations. Will repeat echo February 2024.   Amyloidosis: Not specifically addressed today.   Hypertension: BP is well-controlled. No medication changes today.   Pulmonary embolism: No evidence of PE on recent chest imaging. No concerning symptoms today. History of PE in medial segment of right lower lobe on CT 11/03/2018 following COVID-19 infection. Remains on anticoagulation with Eliquis 5 mg twice daily which is appropriate dose.      Disposition: Keep your appointment with Dr. [name] for March 2024  Medication Adjustments/Labs and Tests Ordered: Current medicines are reviewed at length with the patient today.  Concerns regarding medicines are outlined above.  No orders of the defined types were placed in this encounter.  No  orders of the defined types were placed in this encounter.   Patient Instructions  Medication Instructions:  Your physician recommends that you continue on your current medications as directed. Please refer to the Current Medication list given to you today.  *If you need a refill on your cardiac medications before your next appointment, please call your pharmacy*   Lab Work: None ordered  If you have labs (blood work) drawn today and your tests are completely normal, you will receive your results only by: [place] (if you have MyChart) OR A paper copy in the mail If you have any lab test that is abnormal or we need to change your treatment, we will call you to review the results.   Testing/Procedures: None ordered   Follow-Up: At [facility], you and  your health needs are our priority.  As part of our continuing mission to provide you with exceptional heart care, we have created designated Provider Care Teams.  These Care Teams include your primary Cardiologist (physician) and Advanced Practice Providers (APPs -  Physician Assistants and Nurse Practitioners) who all work together to provide you with the care you need, when you need it.  We recommend signing up for the patient portal called "MyChart".  Sign up information is provided on this After Visit Summary.  MyChart is used to connect with patients for Virtual Visits (Telemedicine).  Patients are able to view lab/test results, encounter notes, upcoming appointments, etc.  Non-urgent messages can be sent to your provider as well.   To learn more about what you can do with MyChart, go to [email or web address].    Your next appointment:   As scheduled   The format for your next appointment:   In Person  Provider:   [name], MD     Other Instructions   Important Information About Sugar         Signed, Emmaline Life, NP  04/13/2022 2:37 PM    [place]

## 2022-04-13 ENCOUNTER — Ambulatory Visit: Payer: Medicare Other | Attending: Nurse Practitioner | Admitting: Nurse Practitioner

## 2022-04-13 ENCOUNTER — Encounter: Payer: Self-pay | Admitting: Nurse Practitioner

## 2022-04-13 VITALS — BP 108/86 | HR 84 | Ht 71.0 in | Wt 191.0 lb

## 2022-04-13 DIAGNOSIS — R072 Precordial pain: Secondary | ICD-10-CM | POA: Diagnosis not present

## 2022-04-13 DIAGNOSIS — I2510 Atherosclerotic heart disease of native coronary artery without angina pectoris: Secondary | ICD-10-CM

## 2022-04-13 DIAGNOSIS — I10 Essential (primary) hypertension: Secondary | ICD-10-CM

## 2022-04-13 DIAGNOSIS — E8581 Light chain (AL) amyloidosis: Secondary | ICD-10-CM

## 2022-04-13 DIAGNOSIS — I350 Nonrheumatic aortic (valve) stenosis: Secondary | ICD-10-CM

## 2022-04-13 NOTE — Patient Instructions (Signed)
Medication Instructions:  Your physician recommends that you continue on your current medications as directed. Please refer to the Current Medication list given to you today.  *If you need a refill on your cardiac medications before your next appointment, please call your pharmacy*   Lab Work: None ordered  If you have labs (blood work) drawn today and your tests are completely normal, you will receive your results only by: [place] (if you have MyChart) OR A paper copy in the mail If you have any lab test that is abnormal or we need to change your treatment, we will call you to review the results.   Testing/Procedures: None ordered   Follow-Up: At [facility], you and your health needs are our priority.  As part of our continuing mission to provide you with exceptional heart care, we have created designated Provider Care Teams.  These Care Teams include your primary Cardiologist (physician) and Advanced Practice Providers (APPs -  Physician Assistants and Nurse Practitioners) who all work together to provide you with the care you need, when you need it.  We recommend signing up for the patient portal called "MyChart".  Sign up information is provided on this After Visit Summary.  MyChart is used to connect with patients for Virtual Visits (Telemedicine).  Patients are able to view lab/test results, encounter notes, upcoming appointments, etc.  Non-urgent messages can be sent to your provider as well.   To learn more about what you can do with MyChart, go to [email or web address].    Your next appointment:   As scheduled   The format for your next appointment:   In Person  Provider:   [name], MD     Other Instructions   Important Information About Sugar

## 2022-04-19 ENCOUNTER — Encounter: Payer: Self-pay | Admitting: Nurse Practitioner

## 2022-04-26 ENCOUNTER — Encounter: Payer: Self-pay | Admitting: Pulmonary Disease

## 2022-04-26 ENCOUNTER — Ambulatory Visit (INDEPENDENT_AMBULATORY_CARE_PROVIDER_SITE_OTHER): Payer: No Typology Code available for payment source | Admitting: Pulmonary Disease

## 2022-04-26 VITALS — BP 118/74 | HR 70 | Ht 71.0 in | Wt 191.0 lb

## 2022-04-26 DIAGNOSIS — K7460 Unspecified cirrhosis of liver: Secondary | ICD-10-CM | POA: Diagnosis not present

## 2022-04-26 DIAGNOSIS — J8410 Pulmonary fibrosis, unspecified: Secondary | ICD-10-CM

## 2022-04-26 NOTE — Patient Instructions (Addendum)
Continue combivent inhaler as needed   Continue CPAP with Oxygen at night    Follow up in 1 year

## 2022-04-26 NOTE — Progress Notes (Signed)
Synopsis: Referred in June 2023 for shortness of breath by Alessandra Grout, FNP  Subjective:   PATIENT ID: Alex [name]. GENDER: male DOB: 08/14/1940, MRN: [id number]  HPI  Chief Complaint  Patient presents with   Follow-up    F/U after CT chest. Breathing has been stable since last visit.    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   He had repeat HRCT Chest 04/08/22 which showed stable fibrosis pattern from 04/20/2021. There was reported evidence of cirrhosis and fusiform aneurysm of the celiac trunk.  He reports decreased appetite over the past 3 months. Weight is 191lbs previously 202lbs at last visit.  OV 02/28/22 He has been stable since last visit. No acute complaints at this time. He remains on CPAP at night.  Initial OV 09/24/21 He was last seen by Dr. Vaughan Browner on 07/27/21 and has been followed for pulmonary fibrosis. He has been referred to our clinic for further evaluation by his [facility] team.   He continues to have significant dyspnea and has limited activity level. He completed pulmonary rehab in 2021 without much improvement in his dyspnea. He has occasional dry cough. He reports he is not sleeping well. He is using CPAP with supplemental oxygen.   Review of his imaging shows stable UIP pattern from 01/2019 until 05/11/21.  He is requesting a portable oxygen concentrator. On simple walk today he did not have oxygen desaturations below 88%.  Past Medical History:  Diagnosis Date   AL amyloidosis [facility]) oncologist-- dr [name]   Renal and Systemic , dx 04/ 2018;  renal bx 08-05-2016 and bone marrow bx 08-29-2016;  8 month chemotherapy then stell cell transplant 12/ 2018 then chemo again;  since then been on oral chemo daily  (11-07-2019 per pt has been on hold until after prostate seed implants)   Arthritis    Atypical chest pain    CAD (coronary artery disease) cardiologist-- dr [name];  also followed by cardiologist w/ [facility] in [place], dr [name]   moderate nonobstrucive disease by cath 2011; Lexiscan Myoview 6/14--Normal study, no scar or ischemia, EF 62%;  11-14-2014 nuclear study low risk w/ normal perfusion, nuclear ef 67%   CKD (chronic kidney disease), stage III (HCC)    COVID    b/l PNA (7/20)- 21 day hospitalization tx with steroids, Remdesevir, Actemra, Hydroxychloroquine) --> PE on Eliquis.   Diverticulosis of colon    Dyspnea    WITH EXERTION    ED (erectile dysfunction)    GERD (gastroesophageal reflux disease)    H/O stem cell transplant ([place]) 03/2017   Headache    Heart murmur    Hiatal hernia    History of 2019 novel coronavirus disease (COVID-19) 09/2018   [facility], hospital admission 10-28-2019 respiratory failure/ pneumometrostinom/ PE ;  was not intubated , treatment with remdesivir/ steroids/ actemra/ plasma   History of basal cell carcinoma (BCC)    History of colon polyps    History of MRSA infection    culture positive   History of pulmonary embolus (PE) 10/2018   with covid 19, completed treatment with eliquis   History of seizures    11-07-2019  per pt last one 1970s, unknown cause   History of vertebral compression fracture 1988  lumbar   Hx of adenomatous colonic polyps    negative in 7/08   Hyperlipidemia    Hypertension    IFG (impaired fasting glucose)    Neuropathy due to chemotherapeutic drug (HCC)    fingers tips and feet   Nocturia    On supplemental oxygen therapy    post covid 19;   11-07-2019 pt uses O2 every night 2L via [place],  and uses supplements during the day when walking around neighborhood and goes out to appointments/ store that he has to walk a distance   OSA (obstructive sleep apnea)    study in epic 03-18-2005 mild  to moderate   (11-07-2019  per pt used cpap up until when he lost alot of weight, stoped approx. 2012   Prostate cancer [facility]) urologist--- dr pace/  oncology-- dr Tammi Klippel   dx 03/ 2021   Pulmonary fibrosis ([place]) 09/2018   pulmonologist-- dr Vaughan Browner;   post covid 19   Seizure ([place])    1st in 1976; last in 1978 GTC x 2    SOB (shortness of breath) on exertion    11-07-2019  per pt walks around neighborhood for 30 minutes twice daily, he does have to stop frequently d/t sob and since weather has been hot he uses his supplemental oxygen;  stated he has a pulse ox. at home , when he is sitting down O2 sat 96 -97% on RA when up walking around the house O2 sat 93-94% on RA;  pt stated he carry's his O2 w/ him when he goes to doctor appts/store d/t distance   Wears glasses    Wears hearing aid in both ears      Family History  Problem Relation Age of Onset   Hearing loss Mother    Diabetes Father    Heart disease Father    Heart attack Father 48   Hypertension Father    Cancer Sister        1 sister had cancer but patient unsure of type   Eczema Sister    Hypertension Sister    Thyroid disease Sister    Hyperlipidemia Other    Seizures Other    Colon cancer Neg Hx    Stroke Neg Hx    Breast cancer Neg Hx    Pancreatic cancer Neg Hx    Prostate cancer Neg Hx      Social History   Socioeconomic History   Marital status: Married    Spouse name: Not on file   Number of children: 2   Years of education: 12   Highest education level: Not on file  Occupational History   Occupation: Retired  Tobacco Use   Smoking status: Former    Packs/day: 3.00    Years: 20.00    Total pack years: 60.00    Types: Cigarettes    Quit date: 04/25/1969    Years since quitting: 53.0   Smokeless tobacco: Former    Types: Chew    Quit date: 11/07/1983  Vaping Use   Vaping Use: Never used  Substance and Sexual Activity   Alcohol use: Yes    Comment: RARE   Drug use: Never   Sexual activity: Not  Currently    Comment: SHIM 1. Reports erectile dysfunction.;    Other Topics Concern   Not on file  Social History Narrative   ** Merged History Encounter **   Lives at home w/ his wife   Right-handed   Caffeine: Dr Malachi Bonds 16 oz/week  Social Determinants of Health   Financial Resource Strain: Not on file  Food Insecurity: Not on file  Transportation Needs: Not on file  Physical Activity: Not on file  Stress: Not on file  Social Connections: Not on file  Intimate Partner Violence: Not on file     Allergies  Allergen Reactions   Ramipril Cough    Other reaction(s): Cough (ALLERGY/intolerance)   Amoxicillin-Pot Clavulanate Diarrhea     Outpatient Medications Prior to Visit  Medication Sig Dispense Refill   acyclovir (ZOVIRAX) 200 MG capsule Take 200 mg by mouth 2 (two) times daily.     albuterol (ACCUNEB) 1.25 MG/3ML nebulizer solution Take 1 ampule by nebulization every 6 (six) hours as needed for shortness of breath or wheezing.     apixaban (ELIQUIS) 5 MG TABS tablet Take 5 mg by mouth 2 (two) times daily.     Ascorbic Acid (VITAMIN C) 1000 MG tablet Take 1,000 mg by mouth 2 (two) times daily.     atorvastatin (LIPITOR) 40 MG tablet Take 40 mg by mouth at bedtime.      azithromycin (ZITHROMAX) 250 MG tablet Take 250 mg by mouth daily.     b complex vitamins capsule Take 1 capsule by mouth daily.     capsaicin (ZOSTRIX) 0.025 % cream Apply 1 Application topically as needed.     carvedilol (COREG) 6.25 MG tablet Take 6.25 mg by mouth every morning. In mornings and 1/2 tab at bedtime     Cholecalciferol 25 MCG (1000 UT) tablet Take 1,000 Units by mouth 2 (two) times a day.      ezetimibe (ZETIA) 10 MG tablet Take 1 tablet by mouth daily.     fluticasone (FLONASE) 50 MCG/ACT nasal spray Place 2 sprays into both nostrils daily. 16 g 5   folic acid (FOLVITE) 1 MG tablet Take 1 mg by mouth at bedtime.      furosemide (LASIX) 40 MG tablet Take 40 mg by mouth daily.       Ipratropium-Albuterol (COMBIVENT RESPIMAT) 20-100 MCG/ACT AERS respimat Inhale 1 puff into the lungs every 6 (six) hours as needed for wheezing or shortness of breath.     isosorbide mononitrate (IMDUR) 15 mg TB24 24 hr tablet Take 15 mg by mouth daily.     lidocaine (LIDODERM) 5 % Place 1 patch onto the skin as needed.     Multiple Vitamin (MULTI-VITAMINS) TABS Take 1 tablet by mouth at bedtime.     mupirocin ointment (BACTROBAN) 2 % Apply to skin lesion TID     nitroGLYCERIN (NITROSTAT) 0.4 MG SL tablet DISSOLVE ONE TABLET UNDER TONGUE EVERY 5 MINUTES AS NEEDED UP TO 3 DOSES, IF NORELIEF CALL 911 25 tablet 8   pantoprazole (PROTONIX) 40 MG tablet Take 40 mg by mouth daily.      potassium chloride SA (KLOR-CON) 20 MEQ tablet Take 20 mEq by mouth daily.     tamsulosin (FLOMAX) 0.4 MG CAPS capsule Take 0.4 mg by mouth daily.     triamcinolone cream (KENALOG) 0.1 % Apply 1 Application topically as needed.     vitamin B-12 (CYANOCOBALAMIN) 500 MCG tablet Take 500 mcg by mouth daily.     No facility-administered medications prior to visit.    Review of Systems  Constitutional:  Positive for weight loss. Negative for chills, fever and malaise/fatigue.  HENT:  Negative for congestion, sinus pain and sore throat.   Eyes: Negative.   Respiratory:  Positive for cough and shortness of breath. Negative for  hemoptysis and sputum production.   Cardiovascular:  Negative for chest pain, palpitations, orthopnea, claudication and leg swelling.  Gastrointestinal:  Negative for abdominal pain, heartburn, nausea and vomiting.  Genitourinary: Negative.   Musculoskeletal:  Negative for joint pain and myalgias.  Skin:  Negative for rash.  Neurological:  Negative for weakness.  Endo/Heme/Allergies: Negative.   Psychiatric/Behavioral: Negative.     Objective:   Vitals:   04/26/22 1301  BP: 118/74  Pulse: 70  SpO2: 96%  Weight: 191 lb (86.6 kg)  Height: '5\' 11"'$  (1.803 m)   Physical Exam Constitutional:       General: He is not in acute distress. HENT:     Head: Normocephalic and atraumatic.  Eyes:     Extraocular Movements: Extraocular movements intact.     Conjunctiva/sclera: Conjunctivae normal.     Pupils: Pupils are equal, round, and reactive to light.  Cardiovascular:     Rate and Rhythm: Normal rate and regular rhythm.     Pulses: Normal pulses.     Heart sounds: Murmur heard.  Pulmonary:     Effort: Pulmonary effort is normal.     Breath sounds: Rales (basilar) present.  Abdominal:     General: Bowel sounds are normal.     Palpations: Abdomen is soft.  Musculoskeletal:     Right lower leg: No edema.     Left lower leg: No edema.  Lymphadenopathy:     Cervical: No cervical adenopathy.  Skin:    General: Skin is warm and dry.  Neurological:     General: No focal deficit present.     Mental Status: He is alert.  Psychiatric:        Mood and Affect: Mood normal.        Behavior: Behavior normal.        Thought Content: Thought content normal.        Judgment: Judgment normal.    CBC    Component Value Date/Time   WBC 10.3 04/01/2022 1225   RBC 5.35 04/01/2022 1225   HGB 15.9 04/01/2022 1225   HGB 15.5 05/27/2021 1136   HGB 15.8 11/03/2020 1614   HGB 11.8 (L) 01/30/2017 1329   HCT 48.7 04/01/2022 1225   HCT 47.9 11/03/2020 1614   HCT 34.5 (L) 01/30/2017 1329   PLT 148 (L) 04/01/2022 1225   PLT 127 (L) 05/27/2021 1136   PLT 126 (L) 11/03/2020 1614   MCV 91.0 04/01/2022 1225   MCV 95 11/03/2020 1614   MCV 98.9 (H) 01/30/2017 1329   MCH 29.7 04/01/2022 1225   MCHC 32.6 04/01/2022 1225   RDW 13.9 04/01/2022 1225   RDW 15.0 11/03/2020 1614   RDW 13.9 01/30/2017 1329   LYMPHSABS 1.9 04/01/2022 1225   LYMPHSABS 1.4 11/03/2020 1614   LYMPHSABS 1.0 01/30/2017 1329   MONOABS 0.8 04/01/2022 1225   MONOABS 0.8 01/30/2017 1329   EOSABS 0.2 04/01/2022 1225   EOSABS 0.0 11/03/2020 1614   BASOSABS 0.1 04/01/2022 1225   BASOSABS 0.1 11/03/2020 1614   BASOSABS 0.0  01/30/2017 1329     Chest imaging: HRCT Chest 04/08/22 1. Pulmonary parenchymal pattern of fibrosis, as described above, similar to 04/20/2021 and in keeping with COVID 19 postinflammatory fibrosis. 2. Cirrhosis. 3. Fusiform aneurysm of the celiac trunk. 4. Aortic atherosclerosis (ICD10-I70.0). Coronary artery calcification.  HRCT Chest 05/11/21 1. The appearance of the lungs is compatible with interstitial lung disease, with a spectrum of findings technically categorized as probable usual interstitial pneumonia (UIP) per current  ATS guidelines. However, given the evolution in appearance on prior CT examinations dating back to October 29, 2018, and more normal appearance on remote prior CT examination dating back to November 16, 2016, findings are favored to reflect sequela of cryptogenic organizing pneumonia, likely from prior COVID infection. 2. Aortic atherosclerosis, in addition to left main and three-vessel coronary artery disease. Assessment for potential risk factor modification, dietary therapy or pharmacologic therapy may be warranted, if clinically indicated. 3. There are severe calcifications of the aortic valve. Echocardiographic correlation for evaluation of potential valvular dysfunction may be warranted if clinically indicated. 4. Hepatic steatosis with evidence of hepatic cirrhosis.   PFT:    Latest Ref Rng & Units 01/31/2020   11:59 AM 11/01/2019    9:47 AM 04/03/2019   10:50 AM  PFT Results  FVC-Pre L 3.15  3.19  3.14   FVC-Predicted Pre % 77  77  76   FVC-Post L 3.11  3.23  3.11   FVC-Predicted Post % 76  78  75   Pre FEV1/FVC % % 84  86  87   Post FEV1/FCV % % 84  87  87   FEV1-Pre L 2.64  2.74  2.73   FEV1-Predicted Pre % 90  92  91   FEV1-Post L 2.62  2.82  2.69   DLCO uncorrected ml/min/mmHg 10.40  15.03  10.94   DLCO UNC% % 42  61  44   DLCO corrected ml/min/mmHg  14.94    DLCO COR %Predicted %  60    DLVA Predicted % 62  81  67   TLC L 4.41  5.22   4.29   TLC % Predicted % 63  75  61   RV % Predicted % 41  56  48   PFT 2021: Mild restriction and severe diffusion defect  Labs:  Path:  Echo 05/31/21: LV EF 60-65%. Moderate LVH. Grade I diastolic dysfunction. RV systolic function is normal. RV size is normal. Moderate aortic valve stenosis.   Heart Catheterization:  Assessment & Plan:   Pulmonary fibrosis (HCC)  Cirrhosis of liver without ascites, unspecified hepatic cirrhosis type ([place]) - Plan: Ambulatory referral to Gastroenterology  Discussion: Zaveon Gillen is a 82 year old male, former smoker with AL amyloidosis with nephrotic syndrome, hypertension, moderate nonobstructive CAD by cath 2011, aortic stenosis, GERD, hyperlipidemia, and carotid artery disease who returns to pulmonary clinic for follow up.   He has post-covid 19 pulmonary fibrosis based on HRCT chest scans that remains stable over the past 2 years.   Heis to continue CPAP with supplemental oxygen at night. He has not qualified for oxygen based on simple walks in the past.  We will refer him to gastroenterology for further assessment of cirrhosis based on recent HRCT chest findings.  I have reached out to our vascular surgery team about the fusiform aneurysm of the celiac trunk and further recommendations.  Follow up in 1 year.  Freda Jackson, MD [facility] Office: [phone]   Current Outpatient Medications:    acyclovir (ZOVIRAX) 200 MG capsule, Take 200 mg by mouth 2 (two) times daily., Disp: , Rfl:    albuterol (ACCUNEB) 1.25 MG/3ML nebulizer solution, Take 1 ampule by nebulization every 6 (six) hours as needed for shortness of breath or wheezing., Disp: , Rfl:    apixaban (ELIQUIS) 5 MG TABS tablet, Take 5 mg by mouth 2 (two) times daily., Disp: , Rfl:    Ascorbic Acid (VITAMIN C) 1000  MG tablet, Take 1,000 mg by mouth 2 (two) times daily., Disp: , Rfl:    atorvastatin (LIPITOR) 40 MG tablet, Take 40 mg by mouth at  bedtime. , Disp: , Rfl:    azithromycin (ZITHROMAX) 250 MG tablet, Take 250 mg by mouth daily., Disp: , Rfl:    b complex vitamins capsule, Take 1 capsule by mouth daily., Disp: , Rfl:    capsaicin (ZOSTRIX) 0.025 % cream, Apply 1 Application topically as needed., Disp: , Rfl:    carvedilol (COREG) 6.25 MG tablet, Take 6.25 mg by mouth every morning. In mornings and 1/2 tab at bedtime, Disp: , Rfl:    Cholecalciferol 25 MCG (1000 UT) tablet, Take 1,000 Units by mouth 2 (two) times a day. , Disp: , Rfl:    ezetimibe (ZETIA) 10 MG tablet, Take 1 tablet by mouth daily., Disp: , Rfl:    fluticasone (FLONASE) 50 MCG/ACT nasal spray, Place 2 sprays into both nostrils daily., Disp: 16 g, Rfl: 5   folic acid (FOLVITE) 1 MG tablet, Take 1 mg by mouth at bedtime. , Disp: , Rfl:    furosemide (LASIX) 40 MG tablet, Take 40 mg by mouth daily. , Disp: , Rfl:    Ipratropium-Albuterol (COMBIVENT RESPIMAT) 20-100 MCG/ACT AERS respimat, Inhale 1 puff into the lungs every 6 (six) hours as needed for wheezing or shortness of breath., Disp: , Rfl:    isosorbide mononitrate (IMDUR) 15 mg TB24 24 hr tablet, Take 15 mg by mouth daily., Disp: , Rfl:    lidocaine (LIDODERM) 5 %, Place 1 patch onto the skin as needed., Disp: , Rfl:    Multiple Vitamin (MULTI-VITAMINS) TABS, Take 1 tablet by mouth at bedtime., Disp: , Rfl:    mupirocin ointment (BACTROBAN) 2 %, Apply to skin lesion TID, Disp: , Rfl:    nitroGLYCERIN (NITROSTAT) 0.4 MG SL tablet, DISSOLVE ONE TABLET UNDER TONGUE EVERY 5 MINUTES AS NEEDED UP TO 3 DOSES, IF NORELIEF CALL 911, Disp: 25 tablet, Rfl: 8   pantoprazole (PROTONIX) 40 MG tablet, Take 40 mg by mouth daily. , Disp: , Rfl:    potassium chloride SA (KLOR-CON) 20 MEQ tablet, Take 20 mEq by mouth daily., Disp: , Rfl:    tamsulosin (FLOMAX) 0.4 MG CAPS capsule, Take 0.4 mg by mouth daily., Disp: , Rfl:    triamcinolone cream (KENALOG) 0.1 %, Apply 1 Application topically as needed., Disp: , Rfl:    vitamin  B-12 (CYANOCOBALAMIN) 500 MCG tablet, Take 500 mcg by mouth daily., Disp: , Rfl:

## 2022-04-28 ENCOUNTER — Other Ambulatory Visit: Payer: Self-pay | Admitting: Cardiovascular Disease

## 2022-04-29 ENCOUNTER — Ambulatory Visit: Payer: Medicare Other | Admitting: Gastroenterology

## 2022-04-29 ENCOUNTER — Other Ambulatory Visit (INDEPENDENT_AMBULATORY_CARE_PROVIDER_SITE_OTHER): Payer: Medicare Other

## 2022-04-29 ENCOUNTER — Encounter: Payer: Self-pay | Admitting: Gastroenterology

## 2022-04-29 VITALS — BP 120/80 | HR 72 | Ht 71.0 in | Wt 194.0 lb

## 2022-04-29 DIAGNOSIS — K760 Fatty (change of) liver, not elsewhere classified: Secondary | ICD-10-CM

## 2022-04-29 LAB — PROTIME-INR
INR: 1.5 ratio — ABNORMAL HIGH (ref 0.8–1.0)
Prothrombin Time: 15.7 s — ABNORMAL HIGH (ref 9.6–13.1)

## 2022-04-29 NOTE — Patient Instructions (Signed)
Your provider has requested that you go to the basement level for lab work before leaving today. Press "B" on the elevator. The lab is located at the first door on the left as you exit the elevator.  Call after speaking with [facility] with [facility] before we can schedule ultrasound.  _______________________________________________________  If you are age 80 or older, your body mass index should be between 23-30. Your Body mass index is 27.06 kg/m. If this is out of the aforementioned range listed, please consider follow up with your Primary Care Provider.  If you are age 57 or younger, your body mass index should be between 19-25. Your Body mass index is 27.06 kg/m. If this is out of the aformentioned range listed, please consider follow up with your Primary Care Provider.   ________________________________________________________  [facility] providers would like to encourage you to use [facility] to communicate with providers for non-urgent requests or questions.  Due to long hold times on the telephone, sending your provider a message by [facility] may be a faster and more efficient way to get a response.  Please allow 48 business hours for a response.  Please remember that this is for non-urgent requests.  _______________________________________________________

## 2022-04-29 NOTE — Progress Notes (Signed)
04/29/2022 Alex [name] [id number] October 11, 1940   HISTORY OF PRESENT ILLNESS: This is a 82 year old male who unfortunately has felt the effects of Sydnee Cabal with consequences of AL amyloidosis with nephrotic syndrome, hypertension, coronary artery disease, aortic stenosis, hyperlipidemia, carotid artery disease, lung disease requiring home oxygen use.  Was referred here by pulmonary, Dr. Erin Fulling, to discuss and evaluate for cirrhosis.  He had a CT of the chest in December that suggested cirrhosis/irregularity of the liver, but contrasted CT scan of the abdomen just 11 days prior to that showed no liver abnormalities.  In 2022 he had a contrasted CT scan that showed diffusely hypoenhancing liver consistent with fatty infiltration.  Platelet count has been slightly low over the past year and a half, most recently at 148 K.  LFTs with mild elevation of AST and ALT, normal alk phos and total bili.   He has no complaints today.  No abdominal pain, no black or bloody stools, no lower extremity swelling, no confusion.  Past Medical History:  Diagnosis Date   AL amyloidosis [facility]) oncologist-- dr [name]   Renal and Systemic , dx 04/ 2018;  renal bx 08-05-2016 and bone marrow bx 08-29-2016;  8 month chemotherapy then stell cell transplant 12/ 2018 then chemo again;  since then been on oral chemo daily (11-07-2019 per pt has been on hold until after prostate seed implants)   Arthritis    Atypical chest pain    CAD (coronary artery disease) cardiologist-- dr [name];  also followed by cardiologist w/ [facility] in [place], dr [name]   moderate nonobstrucive disease by cath 2011; Lexiscan Myoview 6/14--Normal study, no scar or ischemia, EF 62%;  11-14-2014 nuclear study low risk w/ normal perfusion, nuclear ef 67%   CKD (chronic kidney disease), stage III (HCC)    COVID    b/l PNA (7/20)- 21 day hospitalization tx with steroids, Remdesevir, Actemra, Hydroxychloroquine) --> PE on Eliquis.    Diverticulosis of colon    Dyspnea    WITH EXERTION    ED (erectile dysfunction)    GERD (gastroesophageal reflux disease)    H/O stem cell transplant ([place]) 03/2017   Headache    Heart murmur    Hiatal hernia    History of 2019 novel coronavirus disease (COVID-19) 09/2018   [facility], hospital admission 10-28-2019 respiratory failure/ pneumometrostinom/ PE ;  was not intubated , treatment with remdesivir/ steroids/ actemra/ plasma   History of basal cell carcinoma (BCC)    History of colon polyps    History of MRSA infection    culture positive   History of pulmonary embolus (PE) 10/2018   with covid 19, completed treatment with eliquis   History of seizures    11-07-2019  per pt last one 1970s, unknown cause   History of vertebral compression fracture 1988   lumbar   Hx of adenomatous colonic polyps    negative in 7/08   Hyperlipidemia    Hypertension    IFG (impaired fasting glucose)    Neuropathy due to chemotherapeutic drug (HCC)    fingers tips and feet   Nocturia    On supplemental oxygen therapy    post covid 19;   11-07-2019 pt uses O2 every night 2L via [place],  and uses supplements during the day when walking around neighborhood and goes out to appointments/ store that he has to walk a distance   OSA (obstructive sleep apnea)    study in  epic 03-18-2005 mild to moderate   (11-07-2019  per pt used cpap up until when he lost alot of weight, stoped approx. 2012   Prostate cancer [facility]) urologist--- dr pace/  oncology-- dr Tammi Klippel   dx 03/ 2021   Pulmonary fibrosis ([place]) 09/2018   pulmonologist-- dr Vaughan Browner;   post covid 19   Seizure ([place])    1st in 1976; last in 1978 GTC x 2    SOB (shortness of breath) on exertion    11-07-2019  per pt walks around neighborhood for 30 minutes twice daily, he does have to stop frequently d/t sob and since weather has been hot he uses his supplemental oxygen;  stated he has a pulse ox. at home , when he is sitting down  O2 sat 96 -97% on RA when up walking around the house O2 sat 93-94% on RA;  pt stated he carry's his O2 w/ him when he goes to doctor appts/store d/t distance   Wears glasses    Wears hearing aid in both ears    Past Surgical History:  Procedure Laterality Date   ARTERY BIOPSY Right 09/17/2020   Procedure: RIGHT TEMPORAL ARTERY BIOPSY;  Surgeon: Armandina Gemma, MD;  Location: [facility];  Service: General;  Laterality: Right;   CARDIAC CATHETERIZATION  05-21-1999  and 12-29-2000  '@[facility]'$   dr Maryjo [place] nuclear study;  moderate nonobstructive disease involving LAD and LCx;  second done due to in Reversal research trial   CARDIAC CATHETERIZATION  07/07/2009  dr [name]   nonobstructive disease w/ normal LVF   [place]   C2 -- 4   CYSTOSCOPY N/A 11/13/2019   Procedure: CYSTOSCOPY FLEXIBLE;  Surgeon: [name], MD;  Location: [facility];  Service: Urology;  Laterality: N/A;  no seeds detected per Dr. Claudia Desanctis   FOOT SURGERY Bilateral x3 last one 1980s   bunionectomy great toe--- x2 right and x1 left   KNEE ARTHROSCOPY Left 1996   NASAL SEPTUM SURGERY  x3 last one 08-09-2010 @ [place]   PAROTIDECTOMY Right 03/09/2017   RADIOACTIVE SEED IMPLANT N/A 11/13/2019   Procedure: RADIOACTIVE SEED IMPLANT/BRACHYTHERAPY IMPLANT;  Surgeon: [name], MD;  Location: [facility];  Service: Urology;  Laterality: N/A;  ONLY NEEDS 90 MIN FOR ALL PROCEDURES   ROTATOR CUFF REPAIR Bilateral x1 rigth (unsure date);  x2 left last one 09-07-2009 '@[facility] ;  right   SPACE OAR INSTILLATION N/A 11/13/2019   Procedure: SPACE OAR INSTILLATION;  Surgeon: [name], MD;  Location: [facility];  Service: Urology;  Laterality: N/A;   TRIGGER FINGER RELEASE Bilateral x2  last one 2001 approx.   x1 finger left hand;  x2 fingers right hand   UMBILICAL HERNIA REPAIR  01/19/2011   VASECTOMY  approx. 1968 (with anesthesia)    reports  that he quit smoking about 53 years ago. His smoking use included cigarettes. He has a 60.00 pack-year smoking history. He quit smokeless tobacco use about 38 years ago.  His smokeless tobacco use included chew. He reports current alcohol use. He reports that he does not use drugs. family history includes Cancer in his sister; Diabetes in his father; Eczema in his sister; Hearing loss in his mother; Heart attack (age of onset: 58) in his father; Heart disease in his father; Hyperlipidemia in an other family member; Hypertension in his father and sister; Seizures in an other family member; Thyroid disease  in his sister. Allergies  Allergen Reactions   Ramipril Cough    Other reaction(s): Cough (ALLERGY/intolerance)   Amoxicillin-Pot Clavulanate Diarrhea      Outpatient Encounter Medications as of 04/29/2022  Medication Sig   acyclovir (ZOVIRAX) 200 MG capsule Take 200 mg by mouth 2 (two) times daily.   albuterol (ACCUNEB) 1.25 MG/3ML nebulizer solution Take 1 ampule by nebulization every 6 (six) hours as needed for shortness of breath or wheezing.   apixaban (ELIQUIS) 5 MG TABS tablet Take 5 mg by mouth 2 (two) times daily.   Ascorbic Acid (VITAMIN C) 1000 MG tablet Take 1,000 mg by mouth 2 (two) times daily.   atorvastatin (LIPITOR) 40 MG tablet Take 40 mg by mouth at bedtime.    b complex vitamins capsule Take 1 capsule by mouth daily.   capsaicin (ZOSTRIX) 0.025 % cream Apply 1 Application topically as needed.   carvedilol (COREG) 6.25 MG tablet Take 6.25 mg by mouth every morning. In mornings and 1/2 tab at bedtime   Cholecalciferol 25 MCG (1000 UT) tablet Take 1,000 Units by mouth 2 (two) times a day.    ezetimibe (ZETIA) 10 MG tablet Take 1 tablet by mouth daily.   fluticasone (FLONASE) 50 MCG/ACT nasal spray Place 2 sprays into both nostrils daily.   folic acid (FOLVITE) 1 MG tablet Take 1 mg by mouth at bedtime.    furosemide (LASIX) 40 MG tablet Take 40 mg by mouth daily.     Ipratropium-Albuterol (COMBIVENT RESPIMAT) 20-100 MCG/ACT AERS respimat Inhale 1 puff into the lungs every 6 (six) hours as needed for wheezing or shortness of breath.   isosorbide mononitrate (IMDUR) 15 mg TB24 24 hr tablet Take 15 mg by mouth daily.   lidocaine (LIDODERM) 5 % Place 1 patch onto the skin as needed.   Multiple Vitamin (MULTI-VITAMINS) TABS Take 1 tablet by mouth at bedtime.   mupirocin ointment (BACTROBAN) 2 % Apply to skin lesion TID   nitroGLYCERIN (NITROSTAT) 0.4 MG SL tablet DISSOLVE ONE TABLET UNDER TONGUE EVERY 5 MINUTES AS NEEDED UP TO 3 DOSES, IF NORELIEF CALL 911   pantoprazole (PROTONIX) 40 MG tablet Take 40 mg by mouth daily.    potassium chloride SA (KLOR-CON) 20 MEQ tablet Take 20 mEq by mouth daily.   tamsulosin (FLOMAX) 0.4 MG CAPS capsule Take 0.4 mg by mouth daily.   triamcinolone cream (KENALOG) 0.1 % Apply 1 Application topically as needed.   vitamin B-12 (CYANOCOBALAMIN) 500 MCG tablet Take 500 mcg by mouth daily.   [DISCONTINUED] azithromycin (ZITHROMAX) 250 MG tablet Take 250 mg by mouth daily.   No facility-administered encounter medications on file as of 04/29/2022.    REVIEW OF SYSTEMS  : All other systems reviewed and negative except where noted in the History of Present Illness.   PHYSICAL EXAM: BP 120/80   Pulse 72   Ht '5\' 11"'$  (1.803 m)   Wt 194 lb (88 kg)   BMI 27.06 kg/m  General: Well developed white male in no acute distress Head: Normocephalic and atraumatic Eyes:  Sclerae anicteric, conjunctiva pink. Ears: Normal auditory acuity Lungs: Clear throughout to auscultation; no W/R/R. Heart: Regular rate and rhythm; no M/R/G. Abdomen: Soft, non-distended.  BS present.  Non-tender. Musculoskeletal: Symmetrical with no gross deformities  Skin: No lesions on visible extremities Extremities: No edema  Neurological: Alert oriented x 4, grossly non-focal Psychological:  Alert and cooperative. Normal mood and affect  ASSESSMENT AND  PLAN: *Question cirrhosis of the liver: Had a CT  of the chest in December that suggested cirrhosis/irregularity of the liver, but contrasted CT scan of the abdomen just 11 days prior to that showed no liver abnormalities.  In 2022 he had a contrasted CT scan that showed diffusely hypoenhancing liver consistent with fatty infiltration.  Platelet count has been slightly low over the past year and a half, most recently at 148 K.  LFTs with mild elevation of AST and ALT, normal alk phos and total bili.  Will check a PT/INR today.  Will also check an AFP and viral hep studies.  Will plan for a ultrasound with elastography.  May be early cirrhosis, but certainly compensated at this point. *AL amyloidosis *Home O2 use  CC:  Ginger Organ., MD

## 2022-05-02 LAB — HEPATITIS C ANTIBODY: Hepatitis C Ab: NONREACTIVE

## 2022-05-02 LAB — HEPATITIS B SURFACE ANTIGEN: Hepatitis B Surface Ag: NONREACTIVE

## 2022-05-02 LAB — AFP TUMOR MARKER: AFP-Tumor Marker: 1.6 ng/mL (ref <6.1)

## 2022-05-02 LAB — HEPATITIS B SURFACE ANTIBODY,QUALITATIVE: Hep B S Ab: REACTIVE — AB

## 2022-05-06 ENCOUNTER — Encounter: Payer: Self-pay | Admitting: Gastroenterology

## 2022-05-06 DIAGNOSIS — K760 Fatty (change of) liver, not elsewhere classified: Secondary | ICD-10-CM | POA: Insufficient documentation

## 2022-05-07 NOTE — Progress Notes (Signed)
Noted  

## 2022-05-12 ENCOUNTER — Telehealth: Payer: Self-pay | Admitting: Gastroenterology

## 2022-05-12 NOTE — Telephone Encounter (Signed)
Incoming call from patient states they are calling back to get information about future ultrasound. Please advise

## 2022-05-18 ENCOUNTER — Telehealth: Payer: Self-pay | Admitting: *Deleted

## 2022-05-18 NOTE — Telephone Encounter (Signed)
   Pre-operative Risk Assessment    Patient Name: Alex [name].  DOB: May 02, 1940 MRN: [id number]      Request for Surgical Clearance    Procedure:   RIGHT REVERSE TOTAL SHOULDER ARTHROPLASTY  Date of Surgery:  Clearance TBD                                 Surgeon:  DR. Esmond Plants Surgeon's Group or Practice Name:  [facility] Phone number:  [phone] ATTN: [place] Fax number:  [phone]   Type of Clearance Requested:   - Medical  - Pharmacy:  Hold Apixaban (Eliquis)     Type of Anesthesia:   CHOICE   Additional requests/questions:    Jiles Prows   05/18/2022, 5:21 PM

## 2022-05-19 ENCOUNTER — Telehealth: Payer: Self-pay | Admitting: Pulmonary Disease

## 2022-05-19 NOTE — Telephone Encounter (Signed)
Based on ARISCAT Post-operative respiratory risk index patient is Low risk 1.6% risk of in-hospital post-op pulmonary complications (composite including respiratory failure, respiratory infection, pleural effusion, atelectasis, pneumothorax, bronchospasm, aspiration pneumonitis).  Thanks,  JD

## 2022-05-19 NOTE — Telephone Encounter (Signed)
OV notes and clearance form have been faxed back to [facility]. Nothing further needed at this time.

## 2022-05-19 NOTE — Telephone Encounter (Signed)
Fax received from Dr. Esmond Plants with [facility] to perform a RIGHT REVERSE TOTAL SHOULDER ARTHROPLASTY  on patient.  Patient needs surgery clearance. Surgery is pending. Patient was seen on 04/26/2022. Office protocol is a risk assessment can be sent to surgeon if patient has been seen in 60 days or less.   Sending to Dr Erin Fulling for risk assessment or recommendations if patient needs to be seen in office prior to surgical procedure.

## 2022-05-20 NOTE — Telephone Encounter (Addendum)
Patient with diagnosis of atrial fibriliation  on Eliquis for anticoagulation. Patient initially was put on Eliquis for PE following Covid 19 infection in 2020, he was off of Eliquis in 2021 and after his atrial fibriliation diagnosis in 2023, he was put back on on Eliquis.  Procedure: right reverse total shoulder arthroplasty  Date of procedure: TBD  CHA2DS2-VASc Score = 4   This indicates a 4.8% annual risk of stroke. The patient's score is based upon: CHF History: 0 HTN History: 1 Diabetes History: 0 Stroke History: 0 Vascular Disease History: 1 Age Score: 2 Gender Score: 0   CrCl 55 ml/min (Adj BW)  Platelet count 148   Per office protocol, patient can hold Eliquis for 3 days prior to procedure.    **This guidance is not considered finalized until pre-operative APP has relayed final recommendations.**

## 2022-05-23 NOTE — Telephone Encounter (Signed)
   Primary Cardiologist: [name], MD  Chart reviewed as part of pre-operative protocol coverage. Given past medical history and time since last visit, based on ACC/AHA guidelines, Alex [name]. would be at acceptable risk for the planned procedure without further cardiovascular testing. He is walking 1 mile several days per week for exercise without any concerning cardiac symptoms.   Patient was advised that if he develops new symptoms prior to surgery to contact our office to arrange a follow-up appointment. He verbalized understanding.  Per office protocol, patient can hold Eliquis for 3 days prior to procedure.   I will route this recommendation to the requesting party via Epic fax function and remove from pre-op pool.  Please call with questions.  Emmaline Life, NP-C  05/23/2022, 11:11 AM [address], [address] Office [phone] Fax [phone]

## 2022-05-31 ENCOUNTER — Encounter: Payer: Self-pay | Admitting: Vascular Surgery

## 2022-05-31 ENCOUNTER — Ambulatory Visit: Payer: Medicare Other | Admitting: Vascular Surgery

## 2022-05-31 VITALS — BP 119/72 | HR 60 | Temp 98.6°F | Resp 20 | Ht 71.0 in | Wt 193.0 lb

## 2022-05-31 DIAGNOSIS — I728 Aneurysm of other specified arteries: Secondary | ICD-10-CM

## 2022-05-31 DIAGNOSIS — I7143 Infrarenal abdominal aortic aneurysm, without rupture: Secondary | ICD-10-CM | POA: Diagnosis not present

## 2022-05-31 NOTE — Progress Notes (Signed)
VASCULAR AND VEIN SPECIALISTS OF   ASSESSMENT / PLAN: 82 y.o. male with small celiac axis aneurysm (68m); small infrarenal abdominal aortic aneurysm (37m without rupture.  I counseled the patient about the benign nature of these findings.  I will plan to see him again in 1 year with CT angiogram of the abdomen and pelvis.  CHIEF COMPLAINT: Incidental discovery of celiac artery aneurysm  HISTORY OF PRESENT ILLNESS: WiZale Marcotteis a [age].o. male referred to clinic for incidental discovery on CT scan.  Patient is undergoing preoperative workup for orthopedic surgery.  He suffered COVID-pneumonia, and has had chronic respiratory insufficiency since.  CT scan was performed to further evaluate him preoperatively.  This incidentally discovered a celiac artery aneurysm.  This was followed up by CT scan of the abdomen and pelvis, which showed redemonstration of the celiac artery aneurysm as well as an for renal abdominal aortic aneurysm.  Patient is asymptomatic in regards to both.  I spent the bulk of the visit counseling him about the natural history of these aneurysms.  Past Medical History:  Diagnosis Date   [facility]Irene Limbo Renal and Systemic , dx 04/ 2018;  renal bx 08-05-2016 and bone marrow bx 08-29-2016;  8 month chemotherapy then stell cell transplant 12/ 2018 then chemo again;  since then been on oral chemo daily (11-07-2019 per pt has been on hold until after prostate seed implants)   Arthritis    Atypical chest pain    CAD (coronary artery disease) cardiologist-- dr mcAngelena Form [facility] hiEliezer Mccoy moderate nonobstrucive disease by cath 2011; Lexiscan Myoview 6/14--Normal study, no scar or ischemia, EF 62%;  11-14-2014 nuclear study low risk w/ normal perfusion, nuclear ef 67%   CKD (chronic kidney disease), stage III (HCC)    COVID    b/l PNA (7/20)- 21 day hospitalization tx with steroids,  Remdesevir, Actemra, Hydroxychloroquine) --> PE on Eliquis.   Diverticulosis of colon    Dyspnea    WITH EXERTION    ED (erectile dysfunction)    GERD (gastroesophageal reflux disease)    H[facility] stem cell transplant [facility]   Headache    Heart murmur    Hiatal hernia    History of 2019 novel coronavirus disease (COVID-19) 09/2018   [facility] 10[facility]-2019 respiratory failure/ pneumometrostinom/ PE ;  was not intubated , treatment with remdesivir/ steroids/ actemra/ plasma   History of basal cell carcinoma (BCC)    History of colon polyps    History of MRSA infection    culture positive   History of pulmonary embolus (PE) 10/2018   with covid 19, completed treatment with eliquis   History of seizures    11-07-2019  per pt last one 1970s, unknown cause   History of vertebral compression fracture 1988   lumbar   Hx of adenomatous colonic polyps    negative in 7/08   Hyperlipidemia    Hypertension    IFG (impaired fasting glucose)    Neuropathy due to chemotherapeutic drug (HCC)    fingers tips and feet   Nocturia    On supplemental oxygen therapy    post covid 19;   11-07-2019 pt uses O2 every night 2L via [place],  and uses supplements during the day when walking around neighborhood and goes out to appointments/ store that he has to walk a distance   OSA (  obstructive sleep apnea)    study in epic 03-18-2005 mild to moderate   (11-07-2019  per pt used cpap up until when he lost alot of weight, stoped approx. 2012   Prostate cancer [facility]) urologist--- dr pace/  oncology-- dr Tammi Klippel   dx 03/ 2021   Pulmonary fibrosis ([place]) 09/2018   pulmonologist-- dr Vaughan Browner;   post covid 19   Seizure ([place])    1st in 1976; last in 1978 GTC x 2    SOB (shortness of breath) on exertion    11-07-2019  per pt walks around neighborhood for 30 minutes twice daily, he does have to stop frequently d/t sob and since weather has been hot he uses his supplemental  oxygen;  stated he has a pulse ox. at home , when he is sitting down O2 sat 96 -97% on RA when up walking around the house O2 sat 93-94% on RA;  pt stated he carry's his O2 w/ him when he goes to doctor appts/store d/t distance   Wears glasses    Wears hearing aid in both ears     Past Surgical History:  Procedure Laterality Date   ARTERY BIOPSY Right 09/17/2020   Procedure: RIGHT TEMPORAL ARTERY BIOPSY;  Surgeon: Armandina Gemma, MD;  [facility];  Service: General;  Laterality: Right;   CARDIAC CATHETERIZATION  05-21-1999  and 12-29-2000  '@[facility]'$   dr Maryjo [place] nuclear study;  moderate nonobstructive disease involving LAD and LCx;  second done due to in Reversal research trial   CARDIAC CATHETERIZATION  07/07/2009  dr [name]   nonobstructive disease w/ normal LVF   [place]   C2 -- 4   CYSTOSCOPY N/A 11/13/2019   Procedure: CYSTOSCOPY FLEXIBLE;  Surgeon: [name], MD;  Location: [place];  Service: Urology;  Laterality: N/A;  no seeds detected per Dr. Claudia Desanctis   FOOT SURGERY Bilateral x3 last one 1980s   bunionectomy great toe--- x2 right and x1 left   KNEE ARTHROSCOPY Left 1996   NASAL SEPTUM SURGERY  x3 last one 08-09-2010 @ [place]   PAROTIDECTOMY Right 03/09/2017   RADIOACTIVE SEED IMPLANT N/A 11/13/2019   Procedure: RADIOACTIVE SEED IMPLANT/BRACHYTHERAPY IMPLANT;  Surgeon: [name], MD;  Location: [place];  Service: Urology;  Laterality: N/A;  ONLY NEEDS 90 MIN FOR ALL PROCEDURES   ROTATOR CUFF REPAIR Bilateral x1 rigth (unsure date);  x2 left last one 09-07-2009 [facility] ;  right   SPACE OAR INSTILLATION N/A 11/13/2019   Procedure: SPACE OAR INSTILLATION;  Surgeon: [name], MD;  Location: [facility];  Service: Urology;  Laterality: N/A;   TRIGGER FINGER RELEASE Bilateral x2  last one 2001 approx.   x1 finger left hand;  x2 fingers right hand   UMBILICAL HERNIA  REPAIR  01/19/2011   VASECTOMY  approx. 36 (with anesthesia)    Family History  Problem Relation Age of Onset   Hearing loss Mother    Diabetes Father    Heart disease Father    Heart attack Father 83   Hypertension Father    Cancer Sister        1 sister had cancer but patient unsure of type   Eczema Sister    Hypertension Sister    Thyroid disease Sister    Hyperlipidemia Other    Seizures Other    Colon cancer Neg Hx    Stroke Neg Hx  Breast cancer Neg Hx    Pancreatic cancer Neg Hx    Prostate cancer Neg Hx     Social History   Socioeconomic History   Marital status: Married    Spouse name: Not on file   Number of children: 2   Years of education: 12   Highest education level: Not on file  Occupational History   Occupation: Retired  Tobacco Use   Smoking status: Former    Packs/day: 3.00    Years: 20.00    Total pack years: 60.00    Types: Cigarettes    Quit date: 04/25/1969    Years since quitting: 53.1   Smokeless tobacco: Former    Types: Chew    Quit date: 11/07/1983  Vaping Use   Vaping Use: Never used  Substance and Sexual Activity   Alcohol use: Yes    Comment: RARE   Drug use: Never   Sexual activity: Not Currently    Comment: SHIM 1. Reports erectile dysfunction.;    Other Topics Concern   Not on file  Social History Narrative   ** Merged History Encounter **   Lives at home w/ his wife   Right-handed   Caffeine: Dr Malachi Bonds 16 oz/week   Social Determinants of Health   Financial Resource Strain: Not on file  Food Insecurity: Not on file  Transportation Needs: Not on file  Physical Activity: Not on file  Stress: Not on file  Social Connections: Not on file  Intimate Partner Violence: Not on file    Allergies  Allergen Reactions   Ramipril Cough    Other reaction(s): Cough (ALLERGY/intolerance)   Amoxicillin-Pot Clavulanate Diarrhea    Current Outpatient Medications  Medication Sig Dispense Refill   acyclovir (ZOVIRAX) 200 MG  capsule Take 200 mg by mouth 2 (two) times daily.     albuterol (ACCUNEB) 1.25 MG/3ML nebulizer solution Take 1 ampule by nebulization every 6 (six) hours as needed for shortness of breath or wheezing.     apixaban (ELIQUIS) 5 MG TABS tablet Take 5 mg by mouth 2 (two) times daily.     Ascorbic Acid (VITAMIN C) 1000 MG tablet Take 1,000 mg by mouth 2 (two) times daily.     atorvastatin (LIPITOR) 40 MG tablet Take 40 mg by mouth at bedtime.      b complex vitamins capsule Take 1 capsule by mouth daily.     capsaicin (ZOSTRIX) 0.025 % cream Apply 1 Application topically as needed.     carvedilol (COREG) 6.25 MG tablet Take 6.25 mg by mouth every morning. In mornings and 1/2 tab at bedtime     Cholecalciferol 25 MCG (1000 UT) tablet Take 1,000 Units by mouth 2 (two) times a day.      ezetimibe (ZETIA) 10 MG tablet Take 1 tablet by mouth daily.     fluticasone (FLONASE) 50 MCG/ACT nasal spray Place 2 sprays into both nostrils daily. 16 g 5   folic acid (FOLVITE) 1 MG tablet Take 1 mg by mouth at bedtime.      furosemide (LASIX) 40 MG tablet Take 40 mg by mouth daily.      Ipratropium-Albuterol (COMBIVENT RESPIMAT) 20-100 MCG/ACT AERS respimat Inhale 1 puff into the lungs every 6 (six) hours as needed for wheezing or shortness of breath.     isosorbide mononitrate (IMDUR) 15 mg TB24 24 hr tablet Take 15 mg by mouth daily.     lidocaine (LIDODERM) 5 % Place 1 patch onto the skin as needed.  Multiple Vitamin (MULTI-VITAMINS) TABS Take 1 tablet by mouth at bedtime.     mupirocin ointment (BACTROBAN) 2 % Apply to skin lesion TID     nitroGLYCERIN (NITROSTAT) 0.4 MG SL tablet DISSOLVE ONE TABLET UNDER TONGUE EVERY 5 MINUTES AS NEEDED UP TO 3 DOSES, IF NORELIEF CALL 911 25 tablet 11   pantoprazole (PROTONIX) 40 MG tablet Take 40 mg by mouth daily.      potassium chloride SA (KLOR-CON) 20 MEQ tablet Take 20 mEq by mouth daily.     tamsulosin (FLOMAX) 0.4 MG CAPS capsule Take 0.4 mg by mouth daily.      triamcinolone cream (KENALOG) 0.1 % Apply 1 Application topically as needed.     vitamin B-12 (CYANOCOBALAMIN) 500 MCG tablet Take 500 mcg by mouth daily.     No current facility-administered medications for this visit.    PHYSICAL EXAM Vitals:   05/31/22 1326  BP: 119/72  Pulse: 60  Resp: 20  Temp: 98.6 F (37 C)  SpO2: 100%  Weight: 193 lb (87.5 kg)  Height: '5\' 11"'$  (1.803 m)   Well-appearing gentleman in no acute distress Regular rate and rhythm Unlabored breathing Abdomen benign.  PERTINENT LABORATORY AND RADIOLOGIC DATA  Most recent CBC    Latest Ref Rng & Units 04/01/2022   12:25 PM 05/27/2021   11:36 AM 12/25/2020   10:44 AM  CBC  WBC 4.0 - 10.5 K/uL 10.3  9.8  9.4   Hemoglobin 13.0 - 17.0 g/dL 15.9  15.5  15.1   Hematocrit 39.0 - 52.0 % 48.7  45.7  43.7   Platelets 150 - 400 K/uL 148  127  141      Most recent CMP    Latest Ref Rng & Units 04/01/2022   12:25 PM 05/27/2021   11:36 AM 12/25/2020   10:44 AM  CMP  Glucose 70 - 99 mg/dL 110  121  199   BUN 8 - 23 mg/dL '25  29  24   '$ Creatinine 0.61 - 1.24 mg/dL 1.23  1.30  1.35   Sodium 135 - 145 mmol/L 140  141  145   Potassium 3.5 - 5.1 mmol/L 3.6  3.8  3.7   Chloride 98 - 111 mmol/L 108  106  104   CO2 22 - 32 mmol/L '22  29  29   '$ Calcium 8.9 - 10.3 mg/dL 9.1  9.3  9.4   Total Protein 6.5 - 8.1 g/dL 6.2  6.5  6.7   Total Bilirubin 0.3 - 1.2 mg/dL 1.1  1.5  0.9   Alkaline Phos 38 - 126 U/L 66  60  73   AST 15 - 41 U/L 49  37  48   ALT 0 - 44 U/L 47  51  94     Renal function CrCl cannot be calculated (Patient's most recent lab result is older than the maximum 21 days allowed.).  Hgb A1c MFr Bld (%)  Date Value  11/03/2020 6.5 (H)    LDL Cholesterol  Date Value Ref Range Status  07/06/2009  0 - 99 mg/dL Final   79        Total Cholesterol/HDL:CHD Risk Coronary Heart Disease Risk Table                     Men   Women  1/2 Average Risk   3.4   3.3  Average Risk       5.0   4.4  2 X Average Risk  9.6    7.1  3 X Average Risk  23.4   11.0        Use the calculated Patient Ratio above and the CHD Risk Table to determine the patient's CHD Risk.        ATP III CLASSIFICATION (LDL):  <100     mg/dL   Optimal  100-129  mg/dL   Near or Above                    Optimal  130-159  mg/dL   Borderline  160-189  mg/dL   High  >190     mg/dL   Very High    CT scan of the abdomen and pelvis personally reviewed in detail.  There is a small aneurysm in the celiac artery about its bifurcation.  There is a small infrarenal abdominal aortic aneurysm.  Yevonne Aline. Stanford Breed, MD [facility] Vascular and Vein Specialists of [facility] Phone Number: [phone] 05/31/2022 6:50 PM   Total time spent on preparing this encounter including chart review, data review, collecting history, examining the patient, coordinating care for this new patient, 60 minutes.  Portions of this report may have been transcribed using voice recognition software.  Every effort has been made to ensure accuracy; however, inadvertent computerized transcription errors may still be present.

## 2022-06-10 ENCOUNTER — Ambulatory Visit (HOSPITAL_COMMUNITY): Payer: Medicare Other | Attending: Cardiovascular Disease

## 2022-06-10 DIAGNOSIS — I350 Nonrheumatic aortic (valve) stenosis: Secondary | ICD-10-CM | POA: Diagnosis present

## 2022-06-10 LAB — ECHOCARDIOGRAM COMPLETE
AR max vel: 1.07 cm2
AV Area VTI: 1.17 cm2
AV Area mean vel: 1.02 cm2
AV Mean grad: 12 mmHg
AV Peak grad: 21 mmHg
Ao pk vel: 2.29 m/s
Area-P 1/2: 2.66 cm2
Est EF: 50
S' Lateral: 3.15 cm

## 2022-06-21 NOTE — Progress Notes (Signed)
Surgery orders requested via Epic inbox. °

## 2022-06-23 NOTE — H&P (Signed)
Patient's anticipated LOS is less than 2 midnights, meeting these requirements: - Younger than 15 - Lives within 1 hour of care - Has a competent adult at home to recover with post-op recover - NO history of  - Chronic pain requiring opiods  - Diabetes  - Coronary Artery Disease  - Heart failure  - Heart attack  - Stroke  - DVT/VTE  - Cardiac arrhythmia  - Respiratory Failure/COPD  - Renal failure  - Anemia  - Advanced Liver disease     Alex Ohr. is an 82 y.o. male.    Chief Complaint: right shoulder pain  HPI: Pt is a 82 y.o. male complaining of right shoulder pain for multiple years. Pain had continually increased since the beginning. X-rays in the clinic show end-stage arthritic changes of the right shoulder. Pt has tried various conservative treatments which have failed to alleviate their symptoms, including injections and therapy. Various options are discussed with the patient. Risks, benefits and expectations were discussed with the patient. Patient understand the risks, benefits and expectations and wishes to proceed with surgery.   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   moderate nonobstrucive disease by cath 2011; Lexiscan Myoview 6/14--Normal study, no scar or ischemia, EF 62%;  11-14-2014 nuclear study low risk w/ normal perfusion, nuclear ef 67%   CKD (chronic kidney disease), stage III (HCC)    COVID    b/l PNA (7/20)- 21 day  hospitalization tx with steroids, Remdesevir, Actemra, Hydroxychloroquine) --> PE on Eliquis.   Diverticulosis of colon    Dyspnea    WITH EXERTION    ED (erectile dysfunction)    GERD (gastroesophageal reflux disease)    H/O stem cell transplant ([place]) 03/2017   Headache    Heart murmur    Hiatal hernia    History of 2019 novel coronavirus disease (COVID-19) 09/2018   [facility], [facility] 10-28-2019 respiratory failure/ pneumometrostinom/ PE ;  was not intubated , treatment with remdesivir/ steroids/ actemra/ plasma   History of basal cell carcinoma (BCC)    History of colon polyps    History of MRSA infection    culture positive   History of pulmonary embolus (PE) 10/2018   with covid 19, completed treatment with eliquis   History of seizures    11-07-2019  per pt last one 1970s, unknown cause   History of vertebral compression fracture 1988   lumbar   Hx of adenomatous colonic polyps    negative in 7/08   Hyperlipidemia    Hypertension    IFG (impaired fasting glucose)    Neuropathy due to chemotherapeutic drug (HCC)    fingers tips and feet   Nocturia    On supplemental oxygen therapy    post covid 19;   11-07-2019 pt uses O2 every night 2L via [place],  and uses supplements during the day when  walking around neighborhood and goes out to appointments/ store that he has to walk a distance   OSA (obstructive sleep apnea)    study in epic 03-18-2005 mild to moderate   (11-07-2019  per pt used cpap up until when he lost alot of weight, stoped approx. 2012   Prostate cancer [facility]) urologist--- dr pace/  oncology-- dr Tammi Klippel   dx 03/ 2021   Pulmonary fibrosis ([place]) 09/2018   pulmonologist-- dr Vaughan Browner;   post covid 19   Seizure ([place])    1st in 1976; last in 1978 GTC x 2    SOB (shortness of breath) on exertion    11-07-2019  per pt walks around neighborhood for 30 minutes twice daily, he does have to stop frequently d/t sob and since weather has been  hot he uses his supplemental oxygen;  stated he has a pulse ox. at home , when he is sitting down O2 sat 96 -97% on RA when up walking around the house O2 sat 93-94% on RA;  pt stated he carry's his O2 w/ him when he goes to doctor appts/store d/t distance   Wears glasses    Wears hearing aid in both ears     PSH: Past Surgical History:  Procedure Laterality Date   ARTERY BIOPSY Right 09/17/2020   Procedure: RIGHT TEMPORAL ARTERY BIOPSY;  Surgeon: Armandina Gemma, MD;  [facility];  Service: General;  Laterality: Right;   CARDIAC CATHETERIZATION  05-21-1999  and 12-29-2000  '@[facility]'$   dr Maryjo [place] nuclear study;  moderate nonobstructive disease involving LAD and LCx;  second done due to in Reversal research trial   CARDIAC CATHETERIZATION  07/07/2009  dr [name]   nonobstructive disease w/ normal LVF   [place]   C2 -- 4   CYSTOSCOPY N/A 11/13/2019   Procedure: CYSTOSCOPY FLEXIBLE;  Surgeon: [name], MD;  Location: [place];  Service: Urology;  Laterality: N/A;  no seeds detected per Dr. Claudia Desanctis   FOOT SURGERY Bilateral x3 last one 1980s   bunionectomy great toe--- x2 right and x1 left   KNEE ARTHROSCOPY Left 1996   NASAL SEPTUM SURGERY  x3 last one 08-09-2010 @ [place]   PAROTIDECTOMY Right 03/09/2017   RADIOACTIVE SEED IMPLANT N/A 11/13/2019   Procedure: RADIOACTIVE SEED IMPLANT/BRACHYTHERAPY IMPLANT;  Surgeon: [name], MD;  Location: [place];  Service: Urology;  Laterality: N/A;  ONLY NEEDS 90 MIN FOR ALL PROCEDURES   ROTATOR CUFF REPAIR Bilateral x1 rigth (unsure date);  x2 left last one 09-07-2009 [facility] ;  right   SPACE OAR INSTILLATION N/A 11/13/2019   Procedure: SPACE OAR INSTILLATION;  Surgeon: [name], MD;  Location[facility];  Service: Urology;  Laterality: N/A;   TRIGGER FINGER RELEASE Bilateral x2  last one 2001 approx.   x1 finger left hand;  x2 fingers  right hand   UMBILICAL HERNIA REPAIR  01/19/2011   VASECTOMY  approx. 1968 (with anesthesia)    Social History:  reports that he quit smoking about 53 years ago. His smoking use included cigarettes. He has a 60.00 pack-year smoking history. He quit smokeless tobacco use about 38 years ago.  His smokeless tobacco use included chew. He reports current alcohol use. He reports that he does not use drugs. BMI: Estimated body mass index is 26.92 kg/m as calculated from the following:   Height as of 05/31/22: '5\' 11"'$  (1.803  m).   Weight as of 05/31/22: 87.5 kg.  Lab Results  Component Value Date   ALBUMIN 3.6 04/01/2022   Diabetes:   Patient has a diagnosis of diabetes,  Lab Results  Component Value Date   HGBA1C 6.5 (H) 11/03/2020   Smoking Status:      Allergies:  Allergies  Allergen Reactions   Ramipril Cough    Other reaction(s): Cough (ALLERGY/intolerance)   Amoxicillin-Pot Clavulanate Diarrhea    Medications: No current facility-administered medications for this encounter.   Current Outpatient Medications  Medication Sig Dispense Refill   acyclovir (ZOVIRAX) 200 MG capsule Take 200 mg by mouth 2 (two) times daily.     albuterol (ACCUNEB) 1.25 MG/3ML nebulizer solution Take 1 ampule by nebulization every 6 (six) hours as needed for shortness of breath or wheezing.     apixaban (ELIQUIS) 5 MG TABS tablet Take 5 mg by mouth 2 (two) times daily.     Ascorbic Acid (VITAMIN C) 1000 MG tablet Take 1,000 mg by mouth 2 (two) times daily.     atorvastatin (LIPITOR) 40 MG tablet Take 40 mg by mouth at bedtime.      b complex vitamins capsule Take 1 capsule by mouth daily.     capsaicin (ZOSTRIX) 0.025 % cream Apply 1 Application topically as needed.     carvedilol (COREG) 6.25 MG tablet Take 6.25 mg by mouth every morning. In mornings and 1/2 tab at bedtime     Cholecalciferol 25 MCG (1000 UT) tablet Take 1,000 Units by mouth 2 (two) times a day.      ezetimibe (ZETIA) 10 MG tablet  Take 1 tablet by mouth daily.     fluticasone (FLONASE) 50 MCG/ACT nasal spray Place 2 sprays into both nostrils daily. 16 g 5   folic acid (FOLVITE) 1 MG tablet Take 1 mg by mouth at bedtime.      furosemide (LASIX) 40 MG tablet Take 40 mg by mouth daily.      Ipratropium-Albuterol (COMBIVENT RESPIMAT) 20-100 MCG/ACT AERS respimat Inhale 1 puff into the lungs every 6 (six) hours as needed for wheezing or shortness of breath.     isosorbide mononitrate (IMDUR) 15 mg TB24 24 hr tablet Take 15 mg by mouth daily.     lidocaine (LIDODERM) 5 % Place 1 patch onto the skin as needed.     Multiple Vitamin (MULTI-VITAMINS) TABS Take 1 tablet by mouth at bedtime.     mupirocin ointment (BACTROBAN) 2 % Apply to skin lesion TID     nitroGLYCERIN (NITROSTAT) 0.4 MG SL tablet DISSOLVE ONE TABLET UNDER TONGUE EVERY 5 MINUTES AS NEEDED UP TO 3 DOSES, IF NORELIEF CALL 911 25 tablet 11   pantoprazole (PROTONIX) 40 MG tablet Take 40 mg by mouth daily.      potassium chloride SA (KLOR-CON) 20 MEQ tablet Take 20 mEq by mouth daily.     tamsulosin (FLOMAX) 0.4 MG CAPS capsule Take 0.4 mg by mouth daily.     triamcinolone cream (KENALOG) 0.1 % Apply 1 Application topically as needed.     vitamin B-12 (CYANOCOBALAMIN) 500 MCG tablet Take 500 mcg by mouth daily.      No results found for this or any previous visit (from the past 48 hour(s)). No results found.  ROS: Pain with rom of the right upper extremity  Physical Exam: Alert and oriented 82 y.o. male in no acute distress Cranial nerves 2-12 intact Cervical spine: full rom with no tenderness, nv intact distally Chest: active  breath sounds bilaterally, no wheeze rhonchi or rales Heart: regular rate and rhythm, no murmur Abd: non tender non distended with active bowel sounds Hip is stable with rom  Right shoulder painful and weak rom Nv intact distally No rashes or edema distally  Assessment/Plan Assessment: right shoulder cuff  arthropathy  Plan:  Patient will undergo a right reverse total shoulder by Dr. Veverly Fells at [place] Risks benefits and expectations were discussed with the patient. Patient understand risks, benefits and expectations and wishes to proceed. Preoperative templating of the joint replacement has been completed, documented, and submitted to the Operating Room personnel in order to optimize intra-operative equipment management.   Merla Riches [name], MPAS [facility] is now [employer] [address], [place], [place], [place] [address] Phone: [phone] [email or web address] Facebook  [employer]

## 2022-06-26 NOTE — Progress Notes (Signed)
COVID Vaccine received:  '[]'$  No '[x]'$  Yes Date of any COVID positive Test in last 90 days:  PCP - Sable Feil, MD Cardiologist - [name] MD  Christen Bame, NP- cardiac clearance 05-23-2022 phone note [name], MD Pulmonology- Freda Jackson, MD preop clearance, phone note 05-19-2022 Oncology- Sullivan Lone, MD  Chest x-ray - CT chest 04-08-2022  epic EKG -  04-01-2022  epic Stress Test - Brantley Fling 09-08-2020  epic ECHO - 06-03-2022  epic done at [place] (2001, 2002, 2011)by Drs. McAlhany and Brodie  (02-05-2020 at the [facility])  PCR screen: '[x]'$  Ordered & Completed                      '[]'$   No Order but Needs PROFEND                      '[]'$   N/A for this surgery  Surgery Plan:  '[]'$  Ambulatory                            '[x]'$  Outpatient in bed                            '[]'$  Admit  Anesthesia:    '[]'$  General  '[]'$  Spinal                           '[x]'$   Choice '[]'$   MAC  Pacemaker / ICD device '[x]'$  No '[]'$  Yes        Device order form faxed '[x]'$  No    '[]'$   Yes      Faxed to:  Spinal Cord Stimulator:'[x]'$  No '[]'$  Yes      (Remind patient to bring remote DOS) Other Implants:   History of Sleep Apnea? '[]'$  No '[x]'$  Yes   CPAP used?- '[x]'$  No '[]'$  Yes    Does the patient monitor blood sugar? '[]'$  No '[]'$  Yes  '[]'$  N/A  Patient has: '[x]'$  Pre-DM   '[]'$  DM1  '[]'$   DM2 Does patient have a [employer] or Dexacom? '[]'$  No '[]'$  Yes   Fasting Blood Sugar Ranges-  Checks Blood Sugar _____ times a day  Blood Thinner / Instructions: Eliquis May Hold 3 days per Christen Bame, NP phone note 05-23-2022  Last dose: 07-04-2022 Aspirin Instructions: None  ERAS Protocol Ordered: '[]'$  No  '[x]'$  Yes PRE-SURGERY '[]'$  ENSURE  '[x]'$  G2  Patient is to be NPO after: 09:15 am  Comments: Patient also sees the [place] [facility] in [place].   Activity level: Patient is able / unable to climb a flight of stairs without difficulty; '[]'$  No CP  '[]'$  No SOB, but would have ___   Patient can / can not perform ADLs  without assistance.   Anesthesia review: Amyloidosis, Mult. Myeloma (stem cell transplant 2018),Thrombocytopenia, HTN, CAD (mult. Caths),OSA (no CPAP),A. Fib- hx PE (on Eliquis), Pulm. Fibrosis, ACDF (C2-3, 3-4 in 1988) remote seizures-last 1978, Pre-DM, CKD3a, Fatty liver, HOH- has HAs.  Patient denies shortness of breath, fever, cough and chest pain at PAT appointment.  Patient verbalized understanding and agreement to the Pre-Surgical Instructions that were given to them at this PAT appointment. Patient was also educated of the need to review these PAT instructions again prior to his/her surgery.I reviewed the appropriate phone numbers to call if they have any and  questions or concerns.

## 2022-06-26 NOTE — Patient Instructions (Signed)
SURGICAL WAITING ROOM VISITATION Patients having surgery or a procedure may have no more than 2 support people in the waiting area - these visitors may rotate in the visitor waiting room.   Due to an increase in RSV and influenza rates and associated hospitalizations, children ages 6 and under may not visit patients in [place]. If the patient needs to stay at the hospital during part of their recovery, the visitor guidelines for inpatient rooms apply.  PRE-OP VISITATION  Pre-op nurse will coordinate an appropriate time for 1 support person to accompany the patient in pre-op.  This support person may not rotate.  This visitor will be contacted when the time is appropriate for the visitor to come back in the pre-op area.  Please refer to the [facility] website for the visitor guidelines for Inpatients (after your surgery is over and you are in a regular room).  You are not required to quarantine at this time prior to your surgery. However, you must do this: Hand Hygiene often Do NOT share personal items Notify your provider if you are in close contact with someone who has COVID or you develop fever 100.4 or greater, new onset of sneezing, cough, sore throat, shortness of breath or body aches.  If you test positive for Covid or have been in contact with anyone that has tested positive in the last 10 days please notify you surgeon.    Your procedure is scheduled on:  Friday July 08, 2022  Report to [facility] Main Entrance: [place] entrance where the [employer] is available.   Report to admitting at: 09:45  AM  +++++Call this number if you have any questions or problems the morning of surgery [phone]  Do not eat food after Midnight the night prior to your surgery/procedure.  After Midnight you may have the following liquids until  09:15 AM  DAY OF SURGERY  Clear Liquid Diet Water Black Coffee (sugar ok, NO MILK/CREAM OR CREAMERS)  Tea (sugar ok, NO  MILK/CREAM OR CREAMERS) regular and decaf                             Plain Jell-O  with no fruit (NO RED)                                           Fruit ices (not with fruit pulp, NO RED)                                     Popsicles (NO RED)                                                                  Juice: apple, WHITE grape, WHITE cranberry Sports drinks like Gatorade or Powerade (NO RED)                   The day of surgery:  Drink ONE (1) Pre-Surgery  G2 at  09:15 AM the morning of surgery. Drink in one sitting. Do not sip.  This drink was given to you during your hospital pre-op appointment visit. Nothing else to drink after completing the Pre-Surgery G2 : No candy, chewing gum or throat lozenges.    FOLLOW ANY ADDITIONAL PRE OP INSTRUCTIONS YOU RECEIVED FROM YOUR SURGEON'S OFFICE!!!   Oral Hygiene is also important to reduce your risk of infection.        Remember - BRUSH YOUR TEETH THE MORNING OF SURGERY WITH YOUR REGULAR TOOTHPASTE  Do NOT smoke after Midnight the night before surgery.  Take ONLY these medicines the morning of surgery with A SIP OF WATER:isosorbide (Imdur), carvedilol (Coreg), Tamsulosin (Flomax).                      You may not have any metal on your body including jewelry, and body piercing  Do not wear  lotions, powders, cologne, or deodorant  Men may shave face and neck.  Contacts, Hearing Aids, dentures or bridgework may not be worn into surgery. DENTURES WILL BE REMOVED PRIOR TO SURGERY PLEASE DO NOT APPLY "Poly grip" OR ADHESIVES!!!  You may bring a small overnight bag with you on the day of surgery, only pack items that are not valuable. [facility] IS NOT RESPONSIBLE   FOR VALUABLES THAT ARE LOST OR STOLEN.   Do not bring your home medications to the hospital. The Pharmacy will dispense medications listed on your medication list to you during your admission in the Hospital.  Special Instructions: Bring a copy of your healthcare power of  attorney and living will documents the day of surgery, if you wish to have them scanned into your [facility] Medical Records- EPIC  Please read over the following fact sheets you were given: IF YOU HAVE QUESTIONS ABOUT YOUR [place], [place] [phone].   [place] - Preparing for Surgery Before surgery, you can play an important role.  Because skin is not sterile, your skin needs to be as free of germs as possible.  You can reduce the number of germs on your skin by washing with CHG (chlorahexidine gluconate) soap before surgery.  CHG is an antiseptic cleaner which kills germs and bonds with the skin to continue killing germs even after washing. Please DO NOT use if you have an allergy to CHG or antibacterial soaps.  If your skin becomes reddened/irritated stop using the CHG and inform your nurse when you arrive at Short Stay. Do not shave (including legs and underarms) for at least 48 hours prior to the first CHG shower.  You may shave your face/neck.  Please follow these instructions carefully:  1.  Shower with CHG Soap the night before surgery and the  morning of surgery.  2.  If you choose to wash your hair, wash your hair first as usual with your normal  shampoo.  3.  After you shampoo, rinse your hair and body thoroughly to remove the shampoo.                             4.  Use CHG as you would any other liquid soap.  You can apply chg directly to the skin and wash.  Gently with a scrungie or clean washcloth.  5.  Apply the CHG Soap to your body ONLY FROM THE NECK DOWN.   Do not use on face/ open  Wound or open sores. Avoid contact with eyes, ears mouth and genitals (private parts).                       Wash face,  Genitals (private parts) with your normal soap.             6.  Wash thoroughly, paying special attention to the area where your  surgery  will be performed.  7.  Thoroughly rinse your body with warm water from the neck down.  8.  DO  NOT shower/wash with your normal soap after using and rinsing off the CHG Soap.            9.  Pat yourself dry with a clean towel.            10.  Wear clean pajamas.            11.  Place clean sheets on your bed the night of your first shower and do not  sleep with pets.  ON THE DAY OF SURGERY : Do not apply any lotions/deodorants the morning of surgery.  Please wear clean clothes to the hospital/surgery center.   Preparing for Total Shoulder Arthroplasty ================================================================= Please follow these instructions carefully, in addition to any other special Bathing information that was explained to you at the Presurgical Appointment:  BENZOYL PEROXIDE 5% GEL: Used to kill bacteria on the skin which could cause an infection at the surgery site.   Please do not use if you have an allergy to benzoyl peroxide. If your skin becomes reddened/irritated stop using the benzoyl peroxide and inform your Doctor.   Starting two days before surgery, apply as follows:  1. Apply benzoyl peroxide gel in the morning and at night. Apply after taking a shower. If you are not taking a shower, clean entire shoulder front, back, and side, along with the armpit with a clean wet washcloth.  2. Place a quarter-sized dollop of the gel on your SHOULDER and rub in thoroughly, making sure to cover the front, back, and side of your shoulder, along with the armpit.   2 Days prior to Surgery    Wednesday  March 13, 123456 First Application _______ Morning Second Application _______ Night  Day Before Surgery       Thursday  March 14, 123456 First Application______ Morning  On the night before surgery, wash your entire body (except hair, face and private areas) with CHG Soap. THEN, rub in the LAST application of the Benzoyl Peroxide Gel on your shoulder.   3. On the Morning of Surgery wash your BODY AGAIN with CHG Soap (except hair, face and private areas)  4. DO NOT USE THE  BENZOYL PEROXIDE GEL ON THE DAY OF YOUR SURGERY   FAILURE TO FOLLOW THESE INSTRUCTIONS MAY RESULT IN THE CANCELLATION OF YOUR SURGERY  PATIENT SIGNATURE_________________________________  NURSE SIGNATURE__________________________________  ________________________________________________________________________        Adam Phenix    An incentive spirometer is a tool that can help keep your lungs clear and active. This tool measures how well you are filling your lungs with each breath. Taking long deep breaths may help reverse or decrease the chance of developing breathing (pulmonary) problems (especially infection) following: A long period of time when you are unable to move or be active. BEFORE THE PROCEDURE  If the spirometer includes an indicator to show your best effort, your nurse or respiratory therapist will set it to a desired goal. If possible, sit  up straight or lean slightly forward. Try not to slouch. Hold the incentive spirometer in an upright position. INSTRUCTIONS FOR USE  Sit on the edge of your bed if possible, or sit up as far as you can in bed or on a chair. Hold the incentive spirometer in an upright position. Breathe out normally. Place the mouthpiece in your mouth and seal your lips tightly around it. Breathe in slowly and as deeply as possible, raising the piston or the ball toward the top of the column. Hold your breath for 3-5 seconds or for as long as possible. Allow the piston or ball to fall to the bottom of the column. Remove the mouthpiece from your mouth and breathe out normally. Rest for a few seconds and repeat Steps 1 through 7 at least 10 times every 1-2 hours when you are awake. Take your time and take a few normal breaths between deep breaths. The spirometer may include an indicator to show your best effort. Use the indicator as a goal to work toward during each repetition. After each set of 10 deep breaths, practice coughing to be sure  your lungs are clear. If you have an incision (the cut made at the time of surgery), support your incision when coughing by placing a pillow or rolled up towels firmly against it. Once you are able to get out of bed, walk around indoors and cough well. You may stop using the incentive spirometer when instructed by your caregiver.  RISKS AND COMPLICATIONS Take your time so you do not get dizzy or light-headed. If you are in pain, you may need to take or ask for pain medication before doing incentive spirometry. It is harder to take a deep breath if you are having pain. AFTER USE Rest and breathe slowly and easily. It can be helpful to keep track of a log of your progress. Your caregiver can provide you with a simple table to help with this. If you are using the spirometer at home, follow these instructions: [place] IF:  You are having difficultly using the spirometer. You have trouble using the spirometer as often as instructed. Your pain medication is not giving enough relief while using the spirometer. You develop fever of 100.5 F (38.1 C) or higher.                                                                                                    SEEK IMMEDIATE MEDICAL CARE IF:  You cough up bloody sputum that had not been present before. You develop fever of 102 F (38.9 C) or greater. You develop worsening pain at or near the incision site. MAKE SURE YOU:  Understand these instructions. Will watch your condition. Will get help right away if you are not doing well or get worse. Document Released: 08/22/2006 Document Revised: 07/04/2011 Document Reviewed: 10/23/2006 [facility] Patient Information 2014 [place], [place].

## 2022-06-28 ENCOUNTER — Encounter (HOSPITAL_COMMUNITY): Payer: Self-pay

## 2022-06-28 ENCOUNTER — Other Ambulatory Visit: Payer: Self-pay

## 2022-06-28 ENCOUNTER — Encounter (HOSPITAL_COMMUNITY)
Admission: RE | Admit: 2022-06-28 | Discharge: 2022-06-28 | Disposition: A | Discharge status: Home / Self Care | Payer: No Typology Code available for payment source | Source: Ambulatory Visit | Attending: Orthopedic Surgery | Admitting: Orthopedic Surgery

## 2022-06-28 VITALS — BP 116/83 | HR 79 | Temp 97.7°F | Resp 18 | Ht 70.0 in | Wt 186.4 lb

## 2022-06-28 DIAGNOSIS — N1831 Chronic kidney disease, stage 3a: Secondary | ICD-10-CM

## 2022-06-28 DIAGNOSIS — K760 Fatty (change of) liver, not elsewhere classified: Secondary | ICD-10-CM

## 2022-06-28 DIAGNOSIS — R7303 Prediabetes: Secondary | ICD-10-CM | POA: Insufficient documentation

## 2022-06-28 DIAGNOSIS — Z01812 Encounter for preprocedural laboratory examination: Secondary | ICD-10-CM | POA: Insufficient documentation

## 2022-06-28 DIAGNOSIS — I482 Chronic atrial fibrillation, unspecified: Secondary | ICD-10-CM | POA: Diagnosis not present

## 2022-06-28 DIAGNOSIS — I2510 Atherosclerotic heart disease of native coronary artery without angina pectoris: Secondary | ICD-10-CM | POA: Insufficient documentation

## 2022-06-28 DIAGNOSIS — Z01818 Encounter for other preprocedural examination: Secondary | ICD-10-CM

## 2022-06-28 HISTORY — DX: Fatty (change of) liver, not elsewhere classified: K76.0

## 2022-06-28 HISTORY — DX: Pneumonia, unspecified organism: J18.9

## 2022-06-28 LAB — CBC
HCT: 49.3 % (ref 39.0–52.0)
Hemoglobin: 16.6 g/dL (ref 13.0–17.0)
MCH: 31.2 pg (ref 26.0–34.0)
MCHC: 33.7 g/dL (ref 30.0–36.0)
MCV: 92.7 fL (ref 80.0–100.0)
Platelets: 152 10*3/uL (ref 150–400)
RBC: 5.32 MIL/uL (ref 4.22–5.81)
RDW: 13.6 % (ref 11.5–15.5)
WBC: 7 10*3/uL (ref 4.0–10.5)
nRBC: 0 % (ref 0.0–0.2)

## 2022-06-28 LAB — SURGICAL PCR SCREEN
MRSA, PCR: NEGATIVE
Staphylococcus aureus: NEGATIVE

## 2022-06-28 LAB — COMPREHENSIVE METABOLIC PANEL
ALT: 24 U/L (ref 0–44)
AST: 28 U/L (ref 15–41)
Albumin: 4 g/dL (ref 3.5–5.0)
Alkaline Phosphatase: 69 U/L (ref 38–126)
Anion gap: 11 (ref 5–15)
BUN: 26 mg/dL — ABNORMAL HIGH (ref 8–23)
CO2: 25 mmol/L (ref 22–32)
Calcium: 9.3 mg/dL (ref 8.9–10.3)
Chloride: 107 mmol/L (ref 98–111)
Creatinine, Ser: 1.27 mg/dL — ABNORMAL HIGH (ref 0.61–1.24)
GFR, Estimated: 57 mL/min — ABNORMAL LOW (ref >60)
Glucose, Bld: 101 mg/dL — ABNORMAL HIGH (ref 70–99)
Potassium: 3.8 mmol/L (ref 3.5–5.1)
Sodium: 143 mmol/L (ref 135–145)
Total Bilirubin: 1.8 mg/dL — ABNORMAL HIGH (ref 0.3–1.2)
Total Protein: 7.1 g/dL (ref 6.5–8.1)

## 2022-06-28 LAB — GLUCOSE, CAPILLARY: Glucose-Capillary: 119 mg/dL — ABNORMAL HIGH (ref 70–99)

## 2022-06-29 LAB — HEMOGLOBIN A1C
Hgb A1c MFr Bld: 5.5 % (ref 4.8–5.6)
Mean Plasma Glucose: 111 mg/dL

## 2022-06-30 NOTE — Progress Notes (Signed)
Chief Complaint  Patient presents with   Follow-up    CAD, aortic stenosis   History of Present Illness: 82 yo male with history of amyloidosis with nephrotic syndrome, HTN, moderate non-obstructive CAD by cath 2011, aortic stenosis, GERD, temporal arteritis, hyperlipidemia and carotid artery disease who is here today for follow up. Carotid artery dopplers 04/05/12 with mild bilateral disease. Stress myoview July 2016 with no ischemia. Echo May 2018 with July 2016 with normal LV function, mild aortic stenosis. He was seen in our office March 2017 by Truitt Merle, NP and at that time noted occasional chest pain. Imdur was added. His chest pain improved. He was diagnosed with amyloidosis in April 2018 treated with chemotherapy for 8 months, then stem cell transplant December 2018 followed by chemotherapy.He is on maintenance chemotherapy. He was admitted to [facility] July [id number] with complications from Covid 19. He developed respiratory failure and was treated with Remdesivir, steroids, Actemra and plasma. He was diagnosed with a pneumomediastinum. He was found to have a pulmonary embolism and was placed on Eliquis. Echo February 2022  and February 2023 with moderate to severe aortic stenosis. [facility]. He is on prednisone for temporal arteritis. Most recent echo in February 2024 with LVEF=50% with global hypokinesis. Moderate low flow/low gradient aortic stenosis with mean gradient 12 mmHg, peak gradient 21 mmHg, AVA 1.0 cm2, SVI 31, DI 0.31.   He is here today for follow up. The patient denies any chest pain, palpitations, lower extremity edema, orthopnea, PND, dizziness, near syncope or syncope. He has baseline dyspnea which he blames on his chronic lung disease. No change. He has an upcoming shoulder replacement surgery.   Primary Care Physician: Ginger Organ., MD  Past Medical History:  Diagnosis Date   AL amyloidosis [facility]) oncologist-- dr [name]    Renal and Systemic , dx 04/ 2018;  renal bx 08-05-2016 and bone marrow bx 08-29-2016;  8 month chemotherapy then stell cell transplant 12/ 2018 then chemo again;  since then been on oral chemo daily (11-07-2019 per pt has been on hold until after prostate seed implants)   Arthritis    Atypical chest pain    CAD (coronary artery disease) cardiologist-- dr [name];  also followed by cardiologist w/ [place] in [place], dr [name]   moderate nonobstrucive disease by cath 2011; Lexiscan Myoview 6/14--Normal study, no scar or ischemia, EF 62%;  11-14-2014 nuclear study low risk w/ normal perfusion, nuclear ef 67%   CKD (chronic kidney disease), stage III (HCC)    COVID    b/l PNA (7/20)- 21 day hospitalization tx with steroids, Remdesevir, Actemra, Hydroxychloroquine) --> PE on Eliquis.   Diverticulosis of colon    Dyspnea    WITH EXERTION    ED (erectile dysfunction)    Fatty liver    GERD (gastroesophageal reflux disease)    H/O stem cell transplant ([place]) 03/2017   Headache    Heart murmur    Hiatal hernia    History of 2019 novel coronavirus disease (COVID-19) 09/2018   [facility], hospital admission 10-28-2019 respiratory failure/ pneumometrostinom/ PE ;  was not intubated , treatment with remdesivir/ steroids/ actemra/ plasma   History of basal cell carcinoma (BCC)    History of colon polyps    History of MRSA infection    culture positive   History of pulmonary embolus (PE) 10/2018   with covid 19, completed treatment with  eliquis   History of seizures    11-07-2019  per pt last one 1970s, unknown cause   History of vertebral compression fracture 1988   lumbar   Hx of adenomatous colonic polyps    negative in 7/08   Hyperlipidemia    Hypertension    IFG (impaired fasting glucose)    Neuropathy due to chemotherapeutic drug (HCC)    fingers tips and feet   Nocturia    On supplemental oxygen therapy    post covid 19;   11-07-2019 pt uses O2 every night  2L via [place],  and uses supplements during the day when walking around neighborhood and goes out to appointments/ store that he has to walk a distance   OSA (obstructive sleep apnea)    study in epic 03-18-2005 mild to moderate   (11-07-2019  per pt used cpap up until when he lost alot of weight, stoped approx. 2012   Pneumonia    Prostate cancer [facility]) urologist--- dr pace/  oncology-- dr Tammi Klippel   dx 03/ 2021   Pulmonary fibrosis ([place]) 09/2018   pulmonologist-- dr Vaughan Browner;   post covid 19   Seizure ([place])    1st in 1976; last in 1978 GTC x 2    SOB (shortness of breath) on exertion    11-07-2019  per pt walks around neighborhood for 30 minutes twice daily, he does have to stop frequently d/t sob and since weather has been hot he uses his supplemental oxygen;  stated he has a pulse ox. at home , when he is sitting down O2 sat 96 -97% on RA when up walking around the house O2 sat 93-94% on RA;  pt stated he carry's his O2 w/ him when he goes to doctor appts/store d/t distance   Wears glasses    Wears hearing aid in both ears     Past Surgical History:  Procedure Laterality Date   ARTERY BIOPSY Right 09/17/2020   Procedure: RIGHT TEMPORAL ARTERY BIOPSY;  Surgeon: Armandina Gemma, MD;  Location: [facility];  Service: General;  Laterality: Right;   CARDIAC CATHETERIZATION  05-21-1999  and 12-29-2000  '@[facility]   dr Maryjo [place] nuclear study;  moderate nonobstructive disease involving LAD and LCx;  second done due to in Reversal research trial   CARDIAC CATHETERIZATION  07/07/2009  dr [name]   nonobstructive disease w/ normal LVF   [place]   C2 -- 4   CYSTOSCOPY N/A 11/13/2019   Procedure: CYSTOSCOPY FLEXIBLE;  Surgeon: [name], MD;  Location: [place];  Service: Urology;  Laterality: N/A;  no seeds detected per Dr. Claudia Desanctis   FOOT SURGERY Bilateral x3 last one 1980s   bunionectomy great toe--- x2 right and x1 left   KNEE ARTHROSCOPY  Left 1996   NASAL SEPTUM SURGERY  x3 last one 08-09-2010 @ [place]   PAROTIDECTOMY Right 03/09/2017   RADIOACTIVE SEED IMPLANT N/A 11/13/2019   Procedure: RADIOACTIVE SEED IMPLANT/BRACHYTHERAPY IMPLANT;  Surgeon: [name], MD;  Location: [place];  Service: Urology;  Laterality: N/A;  ONLY NEEDS 90 MIN FOR ALL PROCEDURES   ROTATOR CUFF REPAIR Bilateral x1 rigth (unsure date);  x2 left last one 09-07-2009 '@[facility] ;  right   SPACE OAR INSTILLATION N/A 11/13/2019   Procedure: SPACE OAR INSTILLATION;  Surgeon: [name], MD;  Location: [facility];  Service: Urology;  Laterality: N/A;   TRIGGER FINGER RELEASE Bilateral  x2  last one 2001 approx.   x1 finger left hand;  x2 fingers right hand   UMBILICAL HERNIA REPAIR  01/19/2011   VASECTOMY  approx. 1968 (with anesthesia)    Current Outpatient Medications  Medication Sig Dispense Refill   albuterol (ACCUNEB) 1.25 MG/3ML nebulizer solution Take 1 ampule by nebulization every 6 (six) hours as needed for shortness of breath or wheezing.     apixaban (ELIQUIS) 5 MG TABS tablet Take 5 mg by mouth 2 (two) times daily.     Ascorbic Acid (VITAMIN C) 1000 MG tablet Take 1,000 mg by mouth daily.     atorvastatin (LIPITOR) 40 MG tablet Take 40 mg by mouth daily.     capsaicin (ZOSTRIX) 0.025 % cream Apply 1 Application topically as needed (aches and pains).     carvedilol (COREG) 6.25 MG tablet Take 3.125-6.25 mg by mouth See admin instructions. 6.25 mg in the morning, 3.125 mg in the evening     Cholecalciferol 125 MCG (5000 UT) TABS Take 5,000 Units by mouth 2 (two) times a day.     ezetimibe (ZETIA) 10 MG tablet Take 10 mg by mouth daily.     furosemide (LASIX) 40 MG tablet Take 40 mg by mouth daily.      Ipratropium-Albuterol (COMBIVENT RESPIMAT) 20-100 MCG/ACT AERS respimat Inhale 1 puff into the lungs every 6 (six) hours as needed for wheezing or shortness of breath.     isosorbide mononitrate (IMDUR) 30 MG 24 hr  tablet Take 30 mg by mouth daily.     lidocaine (LIDODERM) 5 % Place 1 patch onto the skin daily as needed (pain).     Multiple Vitamin (MULTI-VITAMINS) TABS Take 1 tablet by mouth at bedtime.     mupirocin ointment (BACTROBAN) 2 % Apply 1 Application topically daily as needed (wound care).     nitroGLYCERIN (NITROSTAT) 0.4 MG SL tablet DISSOLVE ONE TABLET UNDER TONGUE EVERY 5 MINUTES AS NEEDED UP TO 3 DOSES, IF NORELIEF CALL 911 25 tablet 11   pantoprazole (PROTONIX) 40 MG tablet Take 40 mg by mouth daily.      potassium chloride SA (KLOR-CON) 20 MEQ tablet Take 20 mEq by mouth daily.     Probiotic Product (PROBIOTIC-10 PO) Take 1 capsule by mouth daily.     tamsulosin (FLOMAX) 0.4 MG CAPS capsule Take 0.4 mg by mouth 2 (two) times daily.     triamcinolone cream (KENALOG) 0.1 % Apply 1 Application topically daily as needed (irritation).     vitamin B-12 (CYANOCOBALAMIN) 250 MCG tablet Take 500 mcg by mouth daily.     zinc gluconate 50 MG tablet Take 50 mg by mouth daily.     fluticasone (FLONASE) 50 MCG/ACT nasal spray Place 2 sprays into both nostrils daily. 16 g 5   No current facility-administered medications for this visit.    Allergies  Allergen Reactions   Ramipril Cough    Other reaction(s): Cough (ALLERGY/intolerance)   Augmentin [Amoxicillin-Pot Clavulanate] Diarrhea    Social History   Socioeconomic History   Marital status: Married    Spouse name: Not on file   Number of children: 2   Years of education: 12   Highest education level: Not on file  Occupational History   Occupation: Retired  Tobacco Use   Smoking status: Former    Packs/day: 3.00    Years: 20.00    Total pack years: 60.00    Types: Cigarettes    Quit date: 04/25/1969    Years since  quitting: 53.2   Smokeless tobacco: Former    Types: Chew    Quit date: 11/07/1983  Vaping Use   Vaping Use: Never used  Substance and Sexual Activity   Alcohol use: Yes    Comment: RARE   Drug use: Never   Sexual  activity: Not Currently    Comment: SHIM 1. Reports erectile dysfunction.;    Other Topics Concern   Not on file  Social History Narrative   ** Merged History Encounter **   Lives at home w/ his wife   Right-handed   Caffeine: Dr Malachi Bonds 16 oz/week   Social Determinants of Health   Financial Resource Strain: Not on file  Food Insecurity: Not on file  Transportation Needs: Not on file  Physical Activity: Not on file  Stress: Not on file  Social Connections: Not on file  Intimate Partner Violence: Not on file    Family History  Problem Relation Age of Onset   Hearing loss Mother    Diabetes Father    Heart disease Father    Heart attack Father 68   Hypertension Father    Cancer Sister        1 sister had cancer but patient unsure of type   Eczema Sister    Hypertension Sister    Thyroid disease Sister    Hyperlipidemia Other    Seizures Other    Colon cancer Neg Hx    Stroke Neg Hx    Breast cancer Neg Hx    Pancreatic cancer Neg Hx    Prostate cancer Neg Hx     Review of Systems:  As stated in the HPI and otherwise negative.   BP 124/70   Pulse 86   Ht '5\' 10"'$  (1.778 m)   Wt 87.4 kg   SpO2 98%   BMI 27.64 kg/m   Physical Examination: General: Well developed, well nourished, NAD  HEENT: OP clear, mucus membranes moist  SKIN: warm, dry. No rashes. Neuro: No focal deficits  Musculoskeletal: Muscle strength 5/5 all ext  Psychiatric: Mood and affect normal  Neck: No JVD, no carotid bruits, no thyromegaly, no lymphadenopathy.  Lungs:Clear bilaterally, no wheezes, rhonci, crackles Cardiovascular: Regular rate and rhythm. Harsh systolic murmur.  Abdomen:Soft. Bowel sounds present. Non-tender.  Extremities: No lower extremity edema. Pulses are 2 + in the bilateral DP/PT.  Echo February 2024:  1. Left ventricular ejection fraction, by estimation, is 50%. The left  ventricle has mildly decreased function. The left ventricle demonstrates  global hypokinesis. There  is mild concentric left ventricular hypertrophy.  Left ventricular diastolic  parameters are consistent with Grade I diastolic dysfunction (impaired  relaxation).   2. Right ventricular systolic function is mildly reduced. The right  ventricular size is normal. There is normal pulmonary artery systolic  pressure. The estimated right ventricular systolic pressure is 123456 mmHg.   3. The mitral valve is normal in structure. No evidence of mitral valve  regurgitation. No evidence of mitral stenosis.   4. The aortic valve is tricuspid. There is moderate calcification of the  aortic valve. Aortic valve regurgitation is mild. Moderate aortic valve  stenosis by doppler indices though visually could be severe. Aortic valve  area, by VTI measures 1.17 cm.  Aortic valve mean gradient measures 12.0 mmHg.   5. There is mild dilatation of the ascending aorta, measuring 39 mm.   6. The inferior vena cava is normal in size with greater than 50%  respiratory variability, suggesting right atrial pressure of  3 mmHg.   FINDINGS   Left Ventricle: Left ventricular ejection fraction, by estimation, is  50%. The left ventricle has mildly decreased function. The left ventricle  demonstrates global hypokinesis. The left ventricular internal cavity size  was normal in size. There is mild  concentric left ventricular hypertrophy. Left ventricular diastolic  parameters are consistent with Grade I diastolic dysfunction (impaired  relaxation).   Right Ventricle: The right ventricular size is normal. No increase in  right ventricular wall thickness. Right ventricular systolic function is  mildly reduced. There is normal pulmonary artery systolic pressure. The  tricuspid regurgitant velocity is 1.70  m/s, and with an assumed right atrial pressure of 3 mmHg, the estimated  right ventricular systolic pressure is 123456 mmHg.   Left Atrium: Left atrial size was normal in size.   Right Atrium: Right atrial size was  normal in size.   Pericardium: There is no evidence of pericardial effusion.   Mitral Valve: The mitral valve is normal in structure. There is mild  calcification of the mitral valve leaflet(s). Mild mitral annular  calcification. No evidence of mitral valve regurgitation. No evidence of  mitral valve stenosis.   Tricuspid Valve: The tricuspid valve is normal in structure. Tricuspid  valve regurgitation is trivial.   Aortic Valve: The aortic valve is tricuspid. There is moderate  calcification of the aortic valve. Aortic valve regurgitation is mild.  Moderate aortic stenosis is present. Aortic valve mean gradient measures  12.0 mmHg. Aortic valve peak gradient measures  21.0 mmHg. Aortic valve area, by VTI measures 1.17 cm.   Pulmonic Valve: The pulmonic valve was normal in structure. Pulmonic valve  regurgitation is trivial.   Aorta: The aortic root is normal in size and structure. There is mild  dilatation of the ascending aorta, measuring 39 mm.   Venous: The inferior vena cava is normal in size with greater than 50%  respiratory variability, suggesting right atrial pressure of 3 mmHg.   IAS/Shunts: No atrial level shunt detected by color flow Doppler.     LEFT VENTRICLE  PLAX 2D  LVIDd:         4.30 cm   Diastology  LVIDs:         3.15 cm   LV e' medial:    4.64 cm/s  LV PW:         1.20 cm   LV E/e' medial:  10.2  LV IVS:        1.30 cm   LV e' lateral:   6.13 cm/s  LVOT diam:     2.20 cm   LV E/e' lateral: 7.7  LV SV:         63  LV SV Index:   31  LVOT Area:     3.80 cm                             3D Volume EF:                           3D EF:        50 %                           LV EDV:       110 ml  LV ESV:       55 ml                           LV SV:        55 ml   RIGHT VENTRICLE  RV Basal diam:  3.50 cm  RV Mid diam:    3.40 cm  RV S prime:     8.59 cm/s  TAPSE (M-mode): 2.0 cm   LEFT ATRIUM             Index        RIGHT  ATRIUM           Index  LA diam:        3.20 cm 1.54 cm/m   RA Area:     16.50 cm  LA Vol (A2C):   35.0 ml 16.85 ml/m  RA Volume:   39.40 ml  18.97 ml/m  LA Vol (A4C):   54.1 ml 26.05 ml/m  LA Biplane Vol: 48.3 ml 23.26 ml/m   AORTIC VALVE  AV Area (Vmax):    1.07 cm  AV Area (Vmean):   1.02 cm  AV Area (VTI):     1.17 cm  AV Vmax:           229.00 cm/s  AV Vmean:          161.000 cm/s  AV VTI:            0.543 m  AV Peak Grad:      21.0 mmHg  AV Mean Grad:      12.0 mmHg  LVOT Vmax:         64.50 cm/s  LVOT Vmean:        43.000 cm/s  LVOT VTI:          0.167 m  LVOT/AV VTI ratio: 0.31    AORTA  Ao Root diam: 3.10 cm  Ao Asc diam:  3.90 cm   MITRAL VALVE               TRICUSPID VALVE  MV Area (PHT): 2.66 cm    TR Peak grad:   11.6 mmHg  MV Decel Time: 285 msec    TR Vmax:        170.00 cm/s  MV E velocity: 47.20 cm/s  MV A velocity: 78.10 cm/s  SHUNTS  MV E/A ratio:  0.60        Systemic VTI:  0.17 m                             Systemic Diam: 2.20 cm   EKG:  EKG is not ordered today. The ekg ordered today demonstrates   Recent Labs: 04/01/2022: Magnesium 2.1 06/28/2022: ALT 24; BUN 26; Creatinine, Ser 1.27; Hemoglobin 16.6; Platelets 152; Potassium 3.8; Sodium 143   Lipid Panel Followed in primary care   Wt Readings from Last 3 Encounters:  07/01/22 87.4 kg  06/28/22 84.5 kg  05/31/22 87.5 kg     Assessment and Plan:   1. CAD with stable angina: He is known to have moderate CAD by cath in 2011. Normal stress test at the [place] in spring 2022. No chest pain suggestive of angina. Will continue statin, Zetia, Imdur and the beta blocker.      2. HYPERTENSION: BP is controlled. No changes today     3. HYPERLIPIDEMIA: Lipids followed in primary care. Continue  statin and Zetia.  4. Aortic stenosis: Moderate AS by echo in February 2024. Low normal LV function. Will repeat echo in 6 months.   5. Amyloidosis: He is being followed in oncology. He has had chemotherapy  and a stem cell transplant. He is in remission.   6. Pulmonary embolism: he remains on Eliquis. He has plans to hold this prior to his shoulder replacement.   7. Pre-operative cardiovascular examination: No concerning findings on exam today. He has moderate aortic stenosis. OK to proceed with his planned shoulder replacement surgery. He can hold his Eliquis 2 days before his planned surgical procedure.   Labs/ tests ordered today include:   Orders Placed This Encounter  Procedures   ECHOCARDIOGRAM COMPLETE     Disposition:   F/U with me in 6 months.   Signed, [name], MD 07/01/2022 12:53 PM    [address], [place], [place]  [address] Phone: [phone]; Fax: [phone]

## 2022-07-01 ENCOUNTER — Encounter: Payer: Self-pay | Admitting: Cardiovascular Disease

## 2022-07-01 ENCOUNTER — Ambulatory Visit: Payer: Medicare Other | Attending: Cardiovascular Disease | Admitting: Cardiovascular Disease

## 2022-07-01 VITALS — BP 124/70 | HR 86 | Ht 70.0 in | Wt 192.6 lb

## 2022-07-01 DIAGNOSIS — I2510 Atherosclerotic heart disease of native coronary artery without angina pectoris: Secondary | ICD-10-CM | POA: Diagnosis not present

## 2022-07-01 DIAGNOSIS — Z01810 Encounter for preprocedural cardiovascular examination: Secondary | ICD-10-CM

## 2022-07-01 DIAGNOSIS — I350 Nonrheumatic aortic (valve) stenosis: Secondary | ICD-10-CM

## 2022-07-01 DIAGNOSIS — E7800 Pure hypercholesterolemia, unspecified: Secondary | ICD-10-CM | POA: Diagnosis not present

## 2022-07-01 DIAGNOSIS — I10 Essential (primary) hypertension: Secondary | ICD-10-CM

## 2022-07-01 NOTE — Patient Instructions (Signed)
Medication Instructions:  No changes *If you need a refill on your cardiac medications before your next appointment, please call your pharmacy*   Lab Work: none   Testing/Procedures: ECHO DUE IN 6 MONTHS  Your physician has requested that you have an echocardiogram. Echocardiography is a painless test that uses sound waves to create images of your heart. It provides your doctor with information about the size and shape of your heart and how well your heart's chambers and valves are working. This procedure takes approximately one hour. There are no restrictions for this procedure. Please do NOT wear cologne, perfume, aftershave, or lotions (deodorant is allowed). Please arrive 15 minutes prior to your appointment time.   Follow-Up: At [facility], you and your health needs are our priority.  As part of our continuing mission to provide you with exceptional heart care, we have created designated Provider Care Teams.  These Care Teams include your primary Cardiologist (physician) and Advanced Practice Providers (APPs -  Physician Assistants and Nurse Practitioners) who all work together to provide you with the care you need, when you need it.  We recommend signing up for the patient portal called "MyChart".  Sign up information is provided on this After Visit Summary.  MyChart is used to connect with patients for Virtual Visits (Telemedicine).  Patients are able to view lab/test results, encounter notes, upcoming appointments, etc.  Non-urgent messages can be sent to your provider as well.   To learn more about what you can do with MyChart, go to [email or web address].    Your next appointment:   6 month(s)  Provider:   [name], MD

## 2022-07-06 NOTE — Progress Notes (Cosign Needed)
Anesthesia Chart Review   Case: [id number] Date/Time: 07/08/22 [id number]   Procedure: REVERSE SHOULDER ARTHROPLASTY (Right: Shoulder)   Anesthesia type: Choice   Pre-op diagnosis: right shoulder osteoarthritis   Location: [facility] ROOM 09 / [facility]   Surgeons: Netta Cedars, MD       DISCUSSION:82 y.o. former smoker with h/o HTN, OSA with CPAP and supplemental O2 at bedtime, CKD Stage III, moderate non-obstructive CAD by cath 2011, moderate aortic stenosis, prostate cancer, PE on Eliquis, right shoulder OA scheduled for above procedure 07/08/2022 Netta Cedars.   Moderate AS by echo in February 2024. Low normal LV function. Echo will be repeated in 6 months.   Pt last seen by cardiology 07/01/2022. Per OV note, "No concerning findings on exam today. He has moderate aortic stenosis. OK to proceed with his planned shoulder replacement surgery. He can hold his Eliquis 2 days before his planned surgical procedure."  Per pulmonology notes 05/19/2022, "Based on ARISCAT Post-operative respiratory risk index patient is Low risk 1.6% risk of in-hospital post-op pulmonary complications (composite including respiratory failure, respiratory infection, pleural effusion, atelectasis, pneumothorax, bronchospasm, aspiration pneumonitis)."  Anticipate pt can proceed with planned procedure barring acute status change.   VS: There were no vitals taken for this visit.  PROVIDERS: Ginger Organ., MD is PCP   [name], MD is Cardiologist  LABS: Labs reviewed: Acceptable for surgery. (all labs ordered are listed, but only abnormal results are displayed)  Labs Reviewed - No data to display   IMAGES:   EKG:   CV: Echo 06/10/22 1. Left ventricular ejection fraction, by estimation, is 50%. The left  ventricle has mildly decreased function. The left ventricle demonstrates  global hypokinesis. There is mild concentric left ventricular hypertrophy.  Left ventricular diastolic  parameters are  consistent with Grade I diastolic dysfunction (impaired  relaxation).   2. Right ventricular systolic function is mildly reduced. The right  ventricular size is normal. There is normal pulmonary artery systolic  pressure. The estimated right ventricular systolic pressure is 123456 mmHg.   3. The mitral valve is normal in structure. No evidence of mitral valve  regurgitation. No evidence of mitral stenosis.   4. The aortic valve is tricuspid. There is moderate calcification of the  aortic valve. Aortic valve regurgitation is mild. Moderate aortic valve  stenosis by doppler indices though visually could be severe. Aortic valve  area, by VTI measures 1.17 cm.  Aortic valve mean gradient measures 12.0 mmHg.   5. There is mild dilatation of the ascending aorta, measuring 39 mm.   6. The inferior vena cava is normal in size with greater than 50%  respiratory variability, suggesting right atrial pressure of 3 mmHg.  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   moderate nonobstrucive disease by cath 2011; Lexiscan Myoview 6/14--Normal study, no scar or ischemia, EF 62%;  11-14-2014 nuclear study low risk w/ normal perfusion, nuclear ef 67%   CKD (chronic kidney disease), stage III ([place])  COVID    b/l PNA (7/20)- 21 day hospitalization tx with steroids, Remdesevir, Actemra, Hydroxychloroquine) --> PE on Eliquis.   Diverticulosis of colon    Dyspnea    WITH EXERTION    ED (erectile dysfunction)    Fatty liver    GERD  (gastroesophageal reflux disease)    H/O stem cell transplant ([place]) 03/2017   Headache    Heart murmur    Hiatal hernia    History of 2019 novel coronavirus disease (COVID-19) 09/2018   [facility], hospital admission 10-28-2019 respiratory failure/ pneumometrostinom/ PE ;  was not intubated , treatment with remdesivir/ steroids/ actemra/ plasma   History of basal cell carcinoma (BCC)    History of colon polyps    History of MRSA infection    culture positive   History of pulmonary embolus (PE) 10/2018   with covid 19, completed treatment with eliquis   History of seizures    11-07-2019  per pt last one 1970s, unknown cause   History of vertebral compression fracture 1988   lumbar   Hx of adenomatous colonic polyps    negative in 7/08   Hyperlipidemia    Hypertension    IFG (impaired fasting glucose)    Neuropathy due to chemotherapeutic drug ([place])    fingers tips and feet   Nocturia    On supplemental oxygen therapy    post covid 19;   11-07-2019 pt uses O2 every night 2L via ,  and uses supplements during the day when walking around neighborhood and goes out to appointments/ store that he has to walk a distance   OSA (obstructive sleep apnea)    study in epic 03-18-2005 mild to moderate   (11-07-2019  per pt used cpap up until when he lost alot of weight, stoped approx. 2012   Pneumonia    Prostate cancer [facility]) urologist--- dr pace/  oncology-- dr Tammi Klippel   dx 03/ 2021   Pulmonary fibrosis ([place]) 09/2018   pulmonologist-- dr Vaughan Browner;   post covid 19   Seizure ([place])    1st in 1976; last in 1978 GTC x 2    SOB (shortness of breath) on exertion    11-07-2019  per pt walks around neighborhood for 30 minutes twice daily, he does have to stop frequently d/t sob and since weather has been hot he uses his supplemental oxygen;  stated he has a pulse ox. at home , when he is sitting down O2 sat 96 -97% on RA when up walking around the house O2 sat 93-94% on RA;  pt  stated he carry's his O2 w/ him when he goes to doctor appts/store d/t distance   Wears glasses    Wears hearing aid in both ears     Past Surgical History:  Procedure Laterality Date   ARTERY BIOPSY Right 09/17/2020   Procedure: RIGHT TEMPORAL ARTERY BIOPSY;  Surgeon: Armandina Gemma, MD;  Location: [facility];  Service: General;  Laterality: Right;   CARDIAC CATHETERIZATION  05-21-1999  and 12-29-2000  '@[facility]   dr Maryjo [place] nuclear study;  moderate nonobstructive disease involving LAD and LCx;  second done due to in Reversal research trial   CARDIAC CATHETERIZATION  07/07/2009  dr [name]   nonobstructive disease w/ normal LVF   [place]   C2 -- 4   CYSTOSCOPY N/A 11/13/2019   Procedure: CYSTOSCOPY FLEXIBLE;  Surgeon: [name], MD;  Location: [place];  Service: Urology;  Laterality: N/A;  no seeds detected per Dr. Claudia Desanctis   FOOT SURGERY Bilateral x3 last one 1980s   bunionectomy great toe--- x2 right and x1 left   KNEE ARTHROSCOPY Left 1996   NASAL SEPTUM SURGERY  x3 last one 08-09-2010 @ [place]   PAROTIDECTOMY Right 03/09/2017   RADIOACTIVE SEED IMPLANT N/A 11/13/2019   Procedure: RADIOACTIVE SEED IMPLANT/BRACHYTHERAPY IMPLANT;  Surgeon: [name], MD;  Location: [place];  Service: Urology;  Laterality: N/A;  ONLY NEEDS 90 MIN FOR ALL PROCEDURES   ROTATOR CUFF REPAIR Bilateral x1 rigth (unsure date);  x2 left last one 09-07-2009 '@[facility] ;  right   SPACE OAR INSTILLATION N/A 11/13/2019   Procedure: SPACE OAR INSTILLATION;  Surgeon: [name], MD;  Location: [facility];  Service: Urology;  Laterality: N/A;   TRIGGER FINGER RELEASE Bilateral x2  last one 2001 approx.   x1 finger left hand;  x2 fingers right hand   UMBILICAL HERNIA REPAIR  01/19/2011   VASECTOMY  approx. 1968 (with anesthesia)    MEDICATIONS: No current facility-administered medications for this encounter.     albuterol (ACCUNEB) 1.25 MG/3ML nebulizer solution   apixaban (ELIQUIS) 5 MG TABS tablet   Ascorbic Acid (VITAMIN C) 1000 MG tablet   atorvastatin (LIPITOR) 40 MG tablet   capsaicin (ZOSTRIX) 0.025 % cream   carvedilol (COREG) 6.25 MG tablet   Cholecalciferol 125 MCG (5000 UT) TABS   ezetimibe (ZETIA) 10 MG tablet   fluticasone (FLONASE) 50 MCG/ACT nasal spray   furosemide (LASIX) 40 MG tablet   Ipratropium-Albuterol (COMBIVENT RESPIMAT) 20-100 MCG/ACT AERS respimat   isosorbide mononitrate (IMDUR) 30 MG 24 hr tablet   lidocaine (LIDODERM) 5 %   Multiple Vitamin (MULTI-VITAMINS) TABS   mupirocin ointment (BACTROBAN) 2 %   nitroGLYCERIN (NITROSTAT) 0.4 MG SL tablet   pantoprazole (PROTONIX) 40 MG tablet   potassium chloride SA (KLOR-CON) 20 MEQ tablet   Probiotic Product (PROBIOTIC-10 PO)   tamsulosin (FLOMAX) 0.4 MG CAPS capsule   triamcinolone cream (KENALOG) 0.1 %   vitamin B-12 (CYANOCOBALAMIN) 250 MCG tablet   zinc gluconate 50 MG tablet    [facility] Ward, [name] [facility] Pre-Surgical Testing [phone]

## 2022-07-06 NOTE — Anesthesia Preprocedure Evaluation (Addendum)
Anesthesia Evaluation  Patient identified by MRN, date of birth, ID band Patient awake    Reviewed: Allergy & Precautions, NPO status , Patient's Chart, lab work & pertinent test results  Airway Mallampati: II  TM Distance: >3 FB Neck ROM: Full    Dental  (+) Teeth Intact, Dental Advisory Given   Pulmonary shortness of breath, with exertion, at rest and Long-Term Oxygen Therapy, sleep apnea , pneumonia, resolved, former smoker   breath sounds clear to auscultation       Cardiovascular hypertension, Pt. on medications and Pt. on home beta blockers + CAD  + Valvular Problems/Murmurs  Rhythm:Regular Rate:Normal  Echo:  1. Left ventricular ejection fraction, by estimation, is 50%. The left  ventricle has mildly decreased function. The left ventricle demonstrates  global hypokinesis. There is mild concentric left ventricular hypertrophy.  Left ventricular diastolic  parameters are consistent with Grade I diastolic dysfunction (impaired  relaxation).   2. Right ventricular systolic function is mildly reduced. The right  ventricular size is normal. There is normal pulmonary artery systolic  pressure. The estimated right ventricular systolic pressure is 123456 mmHg.   3. The mitral valve is normal in structure. No evidence of mitral valve  regurgitation. No evidence of mitral stenosis.   4. The aortic valve is tricuspid. There is moderate calcification of the  aortic valve. Aortic valve regurgitation is mild. Moderate aortic valve  stenosis by doppler indices though visually could be severe. Aortic valve  area, by VTI measures 1.17 cm.  Aortic valve mean gradient measures 12.0 mmHg.   5. There is mild dilatation of the ascending aorta, measuring 39 mm.   6. The inferior vena cava is normal in size with greater than 50%  respiratory variability, suggesting right atrial pressure of 3 mmHg.     Neuro/Psych  Headaches, Seizures -, Well  Controlled,   Neuromuscular disease  negative psych ROS   GI/Hepatic Neg liver ROS, hiatal hernia,GERD  Medicated and Controlled,,  Endo/Other  diabetes    Renal/GU Renal disease     Musculoskeletal   Abdominal   Peds  Hematology   Anesthesia Other Findings   Reproductive/Obstetrics                             Anesthesia Physical Anesthesia Plan  ASA: 3  Anesthesia Plan: General   Post-op Pain Management:    Induction: Intravenous  PONV Risk Score and Plan: 3 and Ondansetron and Treatment may vary due to age or medical condition  Airway Management Planned: Oral ETT  Additional Equipment: None  Intra-op Plan:   Post-operative Plan: Extubation in OR  Informed Consent: I have reviewed the patients History and Physical, chart, labs and discussed the procedure including the risks, benefits and alternatives for the proposed anesthesia with the patient or authorized representative who has indicated his/her understanding and acceptance.     Dental advisory given  Plan Discussed with: CRNA  Anesthesia Plan Comments: (See PAT note 07/06/2022)       Anesthesia Quick Evaluation

## 2022-07-08 ENCOUNTER — Encounter (HOSPITAL_COMMUNITY)
Admission: RE | Disposition: A | Discharge status: Home / Self Care | Payer: Self-pay | Source: Ambulatory Visit | Attending: Orthopedic Surgery

## 2022-07-08 ENCOUNTER — Ambulatory Visit (HOSPITAL_COMMUNITY): Payer: No Typology Code available for payment source

## 2022-07-08 ENCOUNTER — Ambulatory Visit (HOSPITAL_COMMUNITY): Payer: No Typology Code available for payment source | Admitting: Physician Assistant

## 2022-07-08 ENCOUNTER — Ambulatory Visit (HOSPITAL_BASED_OUTPATIENT_CLINIC_OR_DEPARTMENT_OTHER): Payer: No Typology Code available for payment source | Admitting: Physician Assistant

## 2022-07-08 ENCOUNTER — Other Ambulatory Visit: Payer: Self-pay

## 2022-07-08 ENCOUNTER — Observation Stay (HOSPITAL_COMMUNITY)
Admission: RE | Admit: 2022-07-08 | Discharge: 2022-07-09 | Disposition: A | Discharge status: Home / Self Care | Payer: No Typology Code available for payment source | Source: Ambulatory Visit | Attending: Orthopedic Surgery | Admitting: Orthopedic Surgery

## 2022-07-08 ENCOUNTER — Encounter (HOSPITAL_COMMUNITY): Payer: Self-pay | Admitting: Orthopedic Surgery

## 2022-07-08 DIAGNOSIS — Z8546 Personal history of malignant neoplasm of prostate: Secondary | ICD-10-CM | POA: Diagnosis not present

## 2022-07-08 DIAGNOSIS — M19011 Primary osteoarthritis, right shoulder: Secondary | ICD-10-CM

## 2022-07-08 DIAGNOSIS — Z7901 Long term (current) use of anticoagulants: Secondary | ICD-10-CM | POA: Insufficient documentation

## 2022-07-08 DIAGNOSIS — I2510 Atherosclerotic heart disease of native coronary artery without angina pectoris: Secondary | ICD-10-CM

## 2022-07-08 DIAGNOSIS — Z86711 Personal history of pulmonary embolism: Secondary | ICD-10-CM | POA: Diagnosis not present

## 2022-07-08 DIAGNOSIS — I10 Essential (primary) hypertension: Secondary | ICD-10-CM | POA: Diagnosis not present

## 2022-07-08 DIAGNOSIS — N183 Chronic kidney disease, stage 3 unspecified: Secondary | ICD-10-CM | POA: Diagnosis not present

## 2022-07-08 DIAGNOSIS — Z87891 Personal history of nicotine dependence: Secondary | ICD-10-CM

## 2022-07-08 DIAGNOSIS — Z79899 Other long term (current) drug therapy: Secondary | ICD-10-CM | POA: Diagnosis not present

## 2022-07-08 DIAGNOSIS — M12811 Other specific arthropathies, not elsewhere classified, right shoulder: Secondary | ICD-10-CM

## 2022-07-08 DIAGNOSIS — M75101 Unspecified rotator cuff tear or rupture of right shoulder, not specified as traumatic: Secondary | ICD-10-CM | POA: Diagnosis not present

## 2022-07-08 DIAGNOSIS — Z96611 Presence of right artificial shoulder joint: Secondary | ICD-10-CM

## 2022-07-08 DIAGNOSIS — Z85828 Personal history of other malignant neoplasm of skin: Secondary | ICD-10-CM | POA: Diagnosis not present

## 2022-07-08 DIAGNOSIS — Z8616 Personal history of COVID-19: Secondary | ICD-10-CM | POA: Insufficient documentation

## 2022-07-08 DIAGNOSIS — I129 Hypertensive chronic kidney disease with stage 1 through stage 4 chronic kidney disease, or unspecified chronic kidney disease: Secondary | ICD-10-CM | POA: Insufficient documentation

## 2022-07-08 DIAGNOSIS — R7303 Prediabetes: Secondary | ICD-10-CM

## 2022-07-08 HISTORY — PX: REVERSE SHOULDER ARTHROPLASTY: SHX5054

## 2022-07-08 SURGERY — ARTHROPLASTY, SHOULDER, TOTAL, REVERSE
Anesthesia: General | Site: Shoulder | Laterality: Right

## 2022-07-08 MED ORDER — BUPIVACAINE-EPINEPHRINE (PF) 0.25% -1:200000 IJ SOLN
INTRAMUSCULAR | Status: AC
  Filled 2022-07-08: qty 30

## 2022-07-08 MED ORDER — DEXAMETHASONE SODIUM PHOSPHATE 10 MG/ML IJ SOLN
INTRAMUSCULAR | Status: AC
  Filled 2022-07-08: qty 1

## 2022-07-08 MED ORDER — LACTATED RINGERS IV SOLN: INTRAVENOUS | Status: DC

## 2022-07-08 MED ORDER — CARVEDILOL 3.125 MG PO TABS
3.1250 mg | ORAL_TABLET | Freq: Every day | ORAL | Status: DC
  Administered 2022-07-08: 3.125 mg via ORAL
  Filled 2022-07-08: qty 1

## 2022-07-08 MED ORDER — HYDROMORPHONE HCL 1 MG/ML IJ SOLN
INTRAMUSCULAR | Status: AC
  Filled 2022-07-08: qty 1

## 2022-07-08 MED ORDER — PROMETHAZINE HCL 25 MG/ML IJ SOLN: 6.2500 mg | INTRAMUSCULAR | Status: DC | PRN

## 2022-07-08 MED ORDER — ACETAMINOPHEN 325 MG PO TABS: 325.0000 mg | ORAL_TABLET | Freq: Four times a day (QID) | ORAL | Status: DC | PRN

## 2022-07-08 MED ORDER — APIXABAN 5 MG PO TABS
5.0000 mg | ORAL_TABLET | Freq: Two times a day (BID) | ORAL | Status: DC
  Administered 2022-07-08 – 2022-07-09 (×2): 5 mg via ORAL
  Filled 2022-07-08 (×2): qty 1

## 2022-07-08 MED ORDER — FENTANYL CITRATE (PF) 100 MCG/2ML IJ SOLN
INTRAMUSCULAR | Status: AC
  Filled 2022-07-08: qty 2

## 2022-07-08 MED ORDER — EZETIMIBE 10 MG PO TABS
10.0000 mg | ORAL_TABLET | Freq: Every day | ORAL | Status: DC
  Administered 2022-07-08: 10 mg via ORAL
  Filled 2022-07-08: qty 1

## 2022-07-08 MED ORDER — FLUTICASONE PROPIONATE 50 MCG/ACT NA SUSP
2.0000 | Freq: Every day | NASAL | Status: DC
  Filled 2022-07-08: qty 16

## 2022-07-08 MED ORDER — PROPOFOL 10 MG/ML IV BOLUS
INTRAVENOUS | Status: AC
  Filled 2022-07-08: qty 20

## 2022-07-08 MED ORDER — LIDOCAINE HCL (PF) 2 % IJ SOLN
INTRAMUSCULAR | Status: AC
  Filled 2022-07-08: qty 5

## 2022-07-08 MED ORDER — ACETAMINOPHEN 10 MG/ML IV SOLN: 1000.0000 mg | Freq: Once | INTRAVENOUS | Status: DC | PRN

## 2022-07-08 MED ORDER — AMISULPRIDE (ANTIEMETIC) 5 MG/2ML IV SOLN: 10.0000 mg | Freq: Once | INTRAVENOUS | Status: DC | PRN

## 2022-07-08 MED ORDER — SUCCINYLCHOLINE CHLORIDE 200 MG/10ML IV SOSY
PREFILLED_SYRINGE | INTRAVENOUS | Status: AC
  Filled 2022-07-08: qty 10

## 2022-07-08 MED ORDER — ONDANSETRON HCL 4 MG/2ML IJ SOLN: 4.0000 mg | Freq: Four times a day (QID) | INTRAMUSCULAR | Status: DC | PRN

## 2022-07-08 MED ORDER — CEFAZOLIN SODIUM-DEXTROSE 2-4 GM/100ML-% IV SOLN
2.0000 g | INTRAVENOUS | Status: AC
  Administered 2022-07-08: 2 g via INTRAVENOUS
  Filled 2022-07-08: qty 100

## 2022-07-08 MED ORDER — ACETAMINOPHEN 325 MG PO TABS: 325.0000 mg | ORAL_TABLET | Freq: Once | ORAL | Status: DC | PRN

## 2022-07-08 MED ORDER — SODIUM CHLORIDE 0.9 % IR SOLN
Status: DC | PRN
  Administered 2022-07-08: 1000 mL

## 2022-07-08 MED ORDER — ALBUTEROL SULFATE (2.5 MG/3ML) 0.083% IN NEBU: 3.0000 mL | INHALATION_SOLUTION | Freq: Four times a day (QID) | RESPIRATORY_TRACT | Status: DC | PRN

## 2022-07-08 MED ORDER — CARVEDILOL 3.125 MG PO TABS: 3.1250 mg | ORAL_TABLET | ORAL | Status: DC

## 2022-07-08 MED ORDER — CHLORHEXIDINE GLUCONATE 0.12 % MT SOLN
15.0000 mL | Freq: Once | OROMUCOSAL | Status: AC
  Administered 2022-07-08: 15 mL via OROMUCOSAL

## 2022-07-08 MED ORDER — ACETAMINOPHEN 160 MG/5ML PO SOLN: 325.0000 mg | Freq: Once | ORAL | Status: DC | PRN

## 2022-07-08 MED ORDER — TRANEXAMIC ACID-NACL 1000-0.7 MG/100ML-% IV SOLN
INTRAVENOUS | Status: AC
  Filled 2022-07-08: qty 100

## 2022-07-08 MED ORDER — BUPIVACAINE-EPINEPHRINE (PF) 0.25% -1:200000 IJ SOLN
INTRAMUSCULAR | Status: DC | PRN
  Administered 2022-07-08: 14 mL

## 2022-07-08 MED ORDER — PHENYLEPHRINE HCL-NACL 20-0.9 MG/250ML-% IV SOLN
INTRAVENOUS | Status: DC | PRN
  Administered 2022-07-08: 50 ug/min via INTRAVENOUS

## 2022-07-08 MED ORDER — POLYETHYLENE GLYCOL 3350 17 G PO PACK: 17.0000 g | PACK | Freq: Every day | ORAL | Status: DC | PRN

## 2022-07-08 MED ORDER — DEXAMETHASONE SODIUM PHOSPHATE 10 MG/ML IJ SOLN
INTRAMUSCULAR | Status: DC | PRN
  Administered 2022-07-08: 4 mg via INTRAVENOUS

## 2022-07-08 MED ORDER — SUCCINYLCHOLINE CHLORIDE 200 MG/10ML IV SOSY
PREFILLED_SYRINGE | INTRAVENOUS | Status: DC | PRN
  Administered 2022-07-08: 120 mg via INTRAVENOUS

## 2022-07-08 MED ORDER — IPRATROPIUM-ALBUTEROL 0.5-2.5 (3) MG/3ML IN SOLN: 3.0000 mL | Freq: Four times a day (QID) | RESPIRATORY_TRACT | Status: DC | PRN

## 2022-07-08 MED ORDER — ISOSORBIDE MONONITRATE ER 30 MG PO TB24
30.0000 mg | ORAL_TABLET | Freq: Every day | ORAL | Status: DC
  Administered 2022-07-08: 30 mg via ORAL
  Filled 2022-07-08: qty 1

## 2022-07-08 MED ORDER — SODIUM CHLORIDE 0.9 % IV SOLN: INTRAVENOUS | Status: DC

## 2022-07-08 MED ORDER — MORPHINE SULFATE (PF) 2 MG/ML IV SOLN: 0.5000 mg | INTRAVENOUS | Status: DC | PRN

## 2022-07-08 MED ORDER — TRANEXAMIC ACID-NACL 1000-0.7 MG/100ML-% IV SOLN
1000.0000 mg | INTRAVENOUS | Status: AC
  Administered 2022-07-08: 1000 mg via INTRAVENOUS
  Filled 2022-07-08: qty 100

## 2022-07-08 MED ORDER — METHOCARBAMOL 500 MG IVPB - SIMPLE MED
INTRAVENOUS | Status: AC
  Filled 2022-07-08: qty 55

## 2022-07-08 MED ORDER — FENTANYL CITRATE (PF) 100 MCG/2ML IJ SOLN
INTRAMUSCULAR | Status: DC | PRN
  Administered 2022-07-08 (×4): 50 ug via INTRAVENOUS

## 2022-07-08 MED ORDER — TAMSULOSIN HCL 0.4 MG PO CAPS
0.4000 mg | ORAL_CAPSULE | Freq: Two times a day (BID) | ORAL | Status: DC
  Administered 2022-07-08 – 2022-07-09 (×2): 0.4 mg via ORAL
  Filled 2022-07-08 (×2): qty 1

## 2022-07-08 MED ORDER — ONDANSETRON HCL 4 MG PO TABS: 4.0000 mg | ORAL_TABLET | Freq: Four times a day (QID) | ORAL | Status: DC | PRN

## 2022-07-08 MED ORDER — ONDANSETRON HCL 4 MG/2ML IJ SOLN
INTRAMUSCULAR | Status: AC
  Filled 2022-07-08: qty 2

## 2022-07-08 MED ORDER — METOCLOPRAMIDE HCL 5 MG PO TABS: 5.0000 mg | ORAL_TABLET | Freq: Three times a day (TID) | ORAL | Status: DC | PRN

## 2022-07-08 MED ORDER — TRAMADOL HCL 50 MG PO TABS
50.0000 mg | ORAL_TABLET | Freq: Four times a day (QID) | ORAL | Status: DC | PRN
  Administered 2022-07-08 – 2022-07-09 (×3): 50 mg via ORAL
  Filled 2022-07-08 (×3): qty 1

## 2022-07-08 MED ORDER — METHOCARBAMOL 500 MG PO TABS
500.0000 mg | ORAL_TABLET | Freq: Four times a day (QID) | ORAL | Status: DC | PRN
  Administered 2022-07-08: 500 mg via ORAL
  Filled 2022-07-08: qty 1

## 2022-07-08 MED ORDER — METOCLOPRAMIDE HCL 5 MG/ML IJ SOLN: 5.0000 mg | Freq: Three times a day (TID) | INTRAMUSCULAR | Status: DC | PRN

## 2022-07-08 MED ORDER — TRANEXAMIC ACID-NACL 1000-0.7 MG/100ML-% IV SOLN
1000.0000 mg | Freq: Once | INTRAVENOUS | Status: AC
  Administered 2022-07-08: 1000 mg via INTRAVENOUS

## 2022-07-08 MED ORDER — EPHEDRINE SULFATE-NACL 50-0.9 MG/10ML-% IV SOSY
PREFILLED_SYRINGE | INTRAVENOUS | Status: DC | PRN
  Administered 2022-07-08: 5 mg via INTRAVENOUS

## 2022-07-08 MED ORDER — ACETAMINOPHEN 10 MG/ML IV SOLN
INTRAVENOUS | Status: DC | PRN
  Administered 2022-07-08: 1000 mg via INTRAVENOUS

## 2022-07-08 MED ORDER — DOCUSATE SODIUM 100 MG PO CAPS
100.0000 mg | ORAL_CAPSULE | Freq: Two times a day (BID) | ORAL | Status: DC
  Administered 2022-07-08 – 2022-07-09 (×2): 100 mg via ORAL
  Filled 2022-07-08 (×2): qty 1

## 2022-07-08 MED ORDER — ATORVASTATIN CALCIUM 40 MG PO TABS
40.0000 mg | ORAL_TABLET | Freq: Every day | ORAL | Status: DC
  Administered 2022-07-08: 40 mg via ORAL
  Filled 2022-07-08: qty 1

## 2022-07-08 MED ORDER — ACETAMINOPHEN 10 MG/ML IV SOLN
INTRAVENOUS | Status: AC
  Filled 2022-07-08: qty 100

## 2022-07-08 MED ORDER — FUROSEMIDE 40 MG PO TABS
40.0000 mg | ORAL_TABLET | Freq: Every day | ORAL | Status: DC
  Administered 2022-07-08: 40 mg via ORAL
  Filled 2022-07-08: qty 1

## 2022-07-08 MED ORDER — ORAL CARE MOUTH RINSE: 15.0000 mL | Freq: Once | OROMUCOSAL | Status: AC

## 2022-07-08 MED ORDER — PROPOFOL 10 MG/ML IV BOLUS
INTRAVENOUS | Status: DC | PRN
  Administered 2022-07-08: 120 mg via INTRAVENOUS

## 2022-07-08 MED ORDER — HYDROMORPHONE HCL 1 MG/ML IJ SOLN
0.2500 mg | INTRAMUSCULAR | Status: DC | PRN
  Administered 2022-07-08 (×2): 0.25 mg via INTRAVENOUS

## 2022-07-08 MED ORDER — MENTHOL 3 MG MT LOZG: 1.0000 | LOZENGE | OROMUCOSAL | Status: DC | PRN

## 2022-07-08 MED ORDER — ONDANSETRON HCL 4 MG/2ML IJ SOLN
INTRAMUSCULAR | Status: DC | PRN
  Administered 2022-07-08: 4 mg via INTRAVENOUS

## 2022-07-08 MED ORDER — LIDOCAINE 2% (20 MG/ML) 5 ML SYRINGE
INTRAMUSCULAR | Status: DC | PRN
  Administered 2022-07-08: 40 mg via INTRAVENOUS

## 2022-07-08 MED ORDER — PHENOL 1.4 % MT LIQD: 1.0000 | OROMUCOSAL | Status: DC | PRN

## 2022-07-08 MED ORDER — IPRATROPIUM-ALBUTEROL 20-100 MCG/ACT IN AERS: 1.0000 | INHALATION_SPRAY | Freq: Four times a day (QID) | RESPIRATORY_TRACT | Status: DC | PRN

## 2022-07-08 MED ORDER — FENTANYL CITRATE PF 50 MCG/ML IJ SOSY
50.0000 ug | PREFILLED_SYRINGE | Freq: Once | INTRAMUSCULAR | Status: DC
  Filled 2022-07-08: qty 2

## 2022-07-08 MED ORDER — MIDAZOLAM HCL 2 MG/2ML IJ SOLN
1.0000 mg | Freq: Once | INTRAMUSCULAR | Status: DC
  Filled 2022-07-08: qty 2

## 2022-07-08 MED ORDER — PANTOPRAZOLE SODIUM 40 MG PO TBEC
40.0000 mg | DELAYED_RELEASE_TABLET | Freq: Every day | ORAL | Status: DC
  Administered 2022-07-08: 40 mg via ORAL
  Filled 2022-07-08: qty 1

## 2022-07-08 MED ORDER — METHOCARBAMOL 500 MG IVPB - SIMPLE MED
500.0000 mg | Freq: Four times a day (QID) | INTRAVENOUS | Status: DC | PRN
  Administered 2022-07-08: 500 mg via INTRAVENOUS

## 2022-07-08 MED ORDER — CARVEDILOL 6.25 MG PO TABS: 6.2500 mg | ORAL_TABLET | Freq: Every day | ORAL | Status: DC

## 2022-07-08 SURGICAL SUPPLY — 74 items
AID PSTN UNV HD RSTRNT DISP (MISCELLANEOUS) ×1
BAG COUNTER SPONGE SURGICOUNT (BAG) IMPLANT
BAG SPEC THK2 15X12 ZIP CLS (MISCELLANEOUS)
BAG SPNG CNTER NS LX DISP (BAG)
BAG ZIPLOCK 12X15 (MISCELLANEOUS) IMPLANT
BIT DRILL 1.6MX128 (BIT) IMPLANT
BIT DRILL 170X2.5X (BIT) IMPLANT
BIT DRL 170X2.5X (BIT) ×1
BLADE SAG 18X100X1.27 (BLADE) ×1 IMPLANT
COVER BACK TABLE 60X90IN (DRAPES) ×1 IMPLANT
COVER SURGICAL LIGHT HANDLE (MISCELLANEOUS) ×1 IMPLANT
CUP HUMERAL 42 PLUS 3 (Orthopedic Implant) IMPLANT
DRAPE INCISE IOBAN 66X45 STRL (DRAPES) ×1 IMPLANT
DRAPE ORTHO SPLIT 77X108 STRL (DRAPES) ×2
DRAPE SHEET LG 3/4 BI-LAMINATE (DRAPES) ×1 IMPLANT
DRAPE SURG ORHT 6 SPLT 77X108 (DRAPES) ×2 IMPLANT
DRAPE TOP 10253 STERILE (DRAPES) ×1 IMPLANT
DRAPE U-SHAPE 47X51 STRL (DRAPES) ×1 IMPLANT
DRILL 2.5 (BIT) ×1
DRSG ADAPTIC 3X8 NADH LF (GAUZE/BANDAGES/DRESSINGS) ×1 IMPLANT
DURAPREP 26ML APPLICATOR (WOUND CARE) ×1 IMPLANT
ELECT BLADE TIP CTD 4 INCH (ELECTRODE) ×1 IMPLANT
ELECT NDL TIP 2.8 STRL (NEEDLE) ×1 IMPLANT
ELECT NEEDLE TIP 2.8 STRL (NEEDLE) ×1 IMPLANT
ELECT REM PT RETURN 15FT ADLT (MISCELLANEOUS) ×1 IMPLANT
EPIPHYSI RIGHT SZ 2 (Shoulder) ×1 IMPLANT
EPIPHYSIS RIGHT SZ 2 (Shoulder) IMPLANT
FACESHIELD WRAPAROUND (MASK) ×1 IMPLANT
FACESHIELD WRAPAROUND OR TEAM (MASK) ×1 IMPLANT
GAUZE PAD ABD 7.5X8 STRL (GAUZE/BANDAGES/DRESSINGS) IMPLANT
GAUZE PAD ABD 8X10 STRL (GAUZE/BANDAGES/DRESSINGS) ×1 IMPLANT
GAUZE SPONGE 4X4 12PLY STRL (GAUZE/BANDAGES/DRESSINGS) ×1 IMPLANT
GLENOSPHERE XTEND LAT 42+0 STD (Miscellaneous) IMPLANT
GLOVE BIOGEL PI IND STRL 7.5 (GLOVE) ×1 IMPLANT
GLOVE BIOGEL PI IND STRL 8.5 (GLOVE) ×1 IMPLANT
GLOVE ORTHO TXT STRL SZ7.5 (GLOVE) ×1 IMPLANT
GLOVE SURG ORTHO 8.5 STRL (GLOVE) ×1 IMPLANT
GOWN STRL REUS W/ TWL XL LVL3 (GOWN DISPOSABLE) ×2 IMPLANT
GOWN STRL REUS W/TWL XL LVL3 (GOWN DISPOSABLE) ×2
KIT BASIN OR (CUSTOM PROCEDURE TRAY) ×1 IMPLANT
KIT TURNOVER KIT A (KITS) IMPLANT
MANIFOLD NEPTUNE II (INSTRUMENTS) ×1 IMPLANT
METAGLENE DELTA EXTEND (Trauma) IMPLANT
METAGLENE DXTEND (Trauma) ×1 IMPLANT
NDL MAYO CATGUT SZ4 TPR NDL (NEEDLE) ×1 IMPLANT
NEEDLE MAYO CATGUT SZ4 (NEEDLE) ×1 IMPLANT
NS IRRIG 1000ML POUR BTL (IV SOLUTION) ×1 IMPLANT
PACK SHOULDER (CUSTOM PROCEDURE TRAY) ×1 IMPLANT
PIN GUIDE 1.2 (PIN) IMPLANT
PIN GUIDE GLENOPHERE 1.5MX300M (PIN) IMPLANT
PIN METAGLENE 2.5 (PIN) IMPLANT
PIN STEINMAN FIXATION KNEE (PIN) IMPLANT
PROTECTOR NERVE ULNAR (MISCELLANEOUS) ×1 IMPLANT
RESTRAINT HEAD UNIVERSAL NS (MISCELLANEOUS) ×1 IMPLANT
SCREW 4.5X36MM (Screw) IMPLANT
SCREW 48L (Screw) IMPLANT
SLING ARM FOAM STRAP LRG (SOFTGOODS) IMPLANT
SMARTMIX MINI TOWER (MISCELLANEOUS)
SPIKE FLUID TRANSFER (MISCELLANEOUS) ×1 IMPLANT
SPONGE T-LAP 4X18 ~~LOC~~+RFID (SPONGE) ×1 IMPLANT
STEM 12 HA (Stem) IMPLANT
STRIP CLOSURE SKIN 1/2X4 (GAUZE/BANDAGES/DRESSINGS) ×1 IMPLANT
SUCTION FRAZIER HANDLE 10FR (MISCELLANEOUS) ×1
SUCTION TUBE FRAZIER 10FR DISP (MISCELLANEOUS) ×1 IMPLANT
SUT FIBERWIRE #2 38 T-5 BLUE (SUTURE) ×2
SUT MNCRL AB 4-0 PS2 18 (SUTURE) ×1 IMPLANT
SUT VIC AB 0 CT1 36 (SUTURE) ×2 IMPLANT
SUT VIC AB 0 CT2 27 (SUTURE) ×1 IMPLANT
SUT VIC AB 2-0 CT1 27 (SUTURE) ×1
SUT VIC AB 2-0 CT1 TAPERPNT 27 (SUTURE) ×1 IMPLANT
SUTURE FIBERWR #2 38 T-5 BLUE (SUTURE) ×2 IMPLANT
TAPE CLOTH SURG 6X10 WHT LF (GAUZE/BANDAGES/DRESSINGS) IMPLANT
TOWEL OR 17X26 10 PK STRL BLUE (TOWEL DISPOSABLE) ×1 IMPLANT
TOWER SMARTMIX MINI (MISCELLANEOUS) IMPLANT

## 2022-07-08 NOTE — Anesthesia Postprocedure Evaluation (Signed)
Anesthesia Post Note  Patient: Alex [name].  Procedure(s) Performed: REVERSE SHOULDER ARTHROPLASTY (Right: Shoulder)     Patient location during evaluation: PACU Anesthesia Type: General Level of consciousness: awake and alert Pain management: pain level controlled Vital Signs Assessment: post-procedure vital signs reviewed and stable Respiratory status: spontaneous breathing, nonlabored ventilation, respiratory function stable and patient connected to nasal cannula oxygen Cardiovascular status: blood pressure returned to baseline and stable Postop Assessment: no apparent nausea or vomiting Anesthetic complications: no  No notable events documented.  Last Vitals:  Vitals:   07/08/22 1330 07/08/22 1412  BP: (!) 159/89 (!) 150/95  Pulse: (!) 55 (!) 56  Resp: 10 16  Temp:  36.4 C  SpO2: 100% 100%    Last Pain:  Vitals:   07/08/22 1412  TempSrc: Oral  PainSc:                  Effie Berkshire

## 2022-07-08 NOTE — Discharge Instructions (Addendum)
Ice to the shoulder constantly.  Keep the incision covered and clean and dry for one week, then ok to get it wet in the shower. Leave the incision uncovered and open to air after first week.   Do exercise as instructed several times per day.  Pendulums, wrist and elbow ROM and gentle ADLs   DO NOT reach behind your back or push up out of a chair with the operative arm.  DO NOT start Eliquis until tomorrow AM 07/09/22 Saturday  Use a sling while you are up and around for comfort, may remove while seated.  Keep pillow propped behind the operative elbow.  Follow up with Dr Veverly Fells in two weeks in the office, call [phone] for appt  Call Dr Veverly Fells (cell) [phone] with any questions or concerns

## 2022-07-08 NOTE — Op Note (Signed)
NAME: Alex [name], LUEBBERS MEDICAL RECORD NO: [id number] ACCOUNT NO: [id number] DATE OF BIRTH: 1940/07/23 FACILITY: [facility] LOCATION: [facility] PHYSICIAN: Doran Heater. Veverly Fells, MD  Operative Report   DATE OF PROCEDURE: 07/08/2022  PREOPERATIVE DIAGNOSIS:  Right shoulder end-stage arthritis/rotator cuff tear arthropathy.  POSTOPERATIVE DIAGNOSIS:  Right shoulder end-stage arthritis/rotator cuff tear arthropathy.  PROCEDURE PERFORMED:  Right reverse shoulder replacement using DePuy Delta Xtend prosthesis with no subscap repair.  ATTENDING SURGEON:  Doran Heater. Veverly Fells, MD  ASSISTANT:  Charletta Cousin [place], [place], who was scrubbed during the entire procedure, and necessary for satisfactory completion of surgery.  ANESTHESIA:  General anesthesia plus local was used.  ESTIMATED BLOOD LOSS:  Less than 200 mL.  FLUID REPLACEMENT:  1500 mL crystalloid.  COUNTS:  Instrument counts correct.  COMPLICATIONS:  No complications.  ANTIBIOTICS:  Perioperative antibiotics were given.  INDICATIONS:  The patient is a 82 year old male with worsening right shoulder pain and dysfunction secondary to rotator cuff tear arthropathy.  The patient has failed conservative management and desires operative treatment to eliminate pain and restore  function.  Informed consent obtained.  DESCRIPTION OF PROCEDURE:  After an adequate level of anesthesia was achieved, the patient was positioned in the modified beach chair position.  Right shoulder correctly identified and sterile prep and drape performed.  Timeout called, verifying correct  patient, correct site.  We used a standard deltopectoral approach, starting at the coracoid process and extending down the anterior humerus.  Dissection down through subcutaneous tissues using Bovie electrocautery. Cephalic vein was identified, taken  laterally to the deltoid. Pectoralis taken medially.  Conjoined tendon identified and retracted medially.  Deep retractors placed.  Biceps  tenodesed in situ with 0 Vicryl figure-of-eight suture x2.  We then released the subscapularis remnant, which was  not repairable, but we tagged it for protection of the axillary nerve, we released the inferior capsule, progressively externally rotating and extending the shoulder and delivered the humeral head out of the wound. The rotator cuff was basically  completely torn all the way back to teres minor, which we preserved.  We entered the proximal humerus with a 6 mm reamer, we reamed up to a size 12.  We then placed our 12 mm T-handle guide and resected the head at 20 degrees of retroversion with the  oscillating saw.  We then removed the head to the back table for bone graft.  We then removed excess osteophytes with a rongeur along the medial aspect of the neck.  Once we had the osteophytes removed, we subluxed the humerus posteriorly, gaining good  exposure of the glenoid face.  We removed the capsule, the labrum, biceps stump and then the remaining cartilage on the glenoid after placed our deep retractors.  We placed our guide pin centered low on the glenoid face.  We reamed to the subchondral  bone with the metaglene reamer.  We then did our peripheral T-handled reamer and then drilled out our central peg hole.  We then impacted the HA coated press fit metaglene into position.  We placed a 48 screw inferiorly, a 36 screw at the base of the  coracoid locking both the screws into the plate.  We did not place anterior and posterior screws due to the small AP diameter of the glenoid.  We had good baseplate fixation.  We selected a 42 standard plus 0 glenosphere and attached that to the  baseplate with a screwdriver.  I did a finger sweep to make sure we had no soft tissue  caught up in that bearing.  We then went to the humeral side and reamed for the 2 right metaphysis.  Once we had that done, we trialled with the 12 stem and the 2  right metaphysis set on the 0 setting and placed in 20 degrees of  retroversion.  We reduced with a 42+3 poly trial.  We were happy with our soft tissue tensioning on the conjoined and also no gapping with inferior pole or external rotation.  We removed  all trial components, irrigated thoroughly and then used available bone graft from the humeral head with impaction grafting technique.  We then press-fit the size 12 stem with the 2 right metaphysis set on the 0 setting and impacted in 20 degrees of  retroversion, had a nice stable, well supported stem.  We then selected the real 42+3 poly and placed on the humeral tray and impacted that.  We then reduced the shoulder.  Nice little pop as it reduced.  Conjoined appropriately tight.  No gap at all  with external rotation or inferior pole.  We then irrigated thoroughly and resected the subscap remnant prior to closing deltopectoral interval with 0 Vicryl suture followed by 2-0 Vicryl for subcutaneous closure and 4-0 Monocryl for skin.  Steri-Strips  applied followed by sterile dressing and a shoulder sling.  Patient transported to recovery room in stable condition.   PUS D: 07/08/2022 11:07:43 am T: 07/08/2022 11:20:00 am  JOB: [id number]

## 2022-07-08 NOTE — TOC CM/SW Note (Signed)
Transition of Care [facility]) Screening Note  Patient Details  Name: Alex [name]. Date of Birth: Dec 28, 1940  Transition of [facility]) CM/SW Contact:    Sherie Don, LCSW Phone Number: 07/08/2022, 3:10 PM  Transition of Care Department [facility]) has reviewed patient and no TOC needs have been identified at this time. We will continue to monitor patient advancement through interdisciplinary progression rounds. If new patient transition needs arise, please place a TOC consult.

## 2022-07-08 NOTE — Plan of Care (Signed)
  Problem: Education: Goal: Knowledge of General Education information will improve Description Including pain rating scale, medication(s)/side effects and non-pharmacologic comfort measures Outcome: Progressing   

## 2022-07-08 NOTE — Anesthesia Procedure Notes (Signed)
Procedure Name: Intubation Date/Time: 07/08/2022 9:30 AM  Performed by: West Pugh, CRNAPre-anesthesia Checklist: Patient identified, Emergency Drugs available, Suction available, Patient being monitored and Timeout performed Patient Re-evaluated:Patient Re-evaluated prior to induction Oxygen Delivery Method: Circle system utilized Preoxygenation: Pre-oxygenation with 100% oxygen Induction Type: IV induction Ventilation: Mask ventilation without difficulty Laryngoscope Size: Mac and 4 Grade View: Grade I Tube type: Oral Tube size: 7.5 mm Number of attempts: 1 Airway Equipment and Method: Stylet Placement Confirmation: ETT inserted through vocal cords under direct vision, positive ETCO2, CO2 detector and breath sounds checked- equal and bilateral Secured at: 23 cm Tube secured with: Tape Dental Injury: Teeth and Oropharynx as per pre-operative assessment

## 2022-07-08 NOTE — Progress Notes (Signed)
Patient's BP slightly elevated and no DVT prophylaxis med listed on patient's med list. Reached out to [facility], PA to make aware. PA to put orders in for Eliquis and patient's home meds (including BP meds) to be resumed.

## 2022-07-08 NOTE — Progress Notes (Addendum)
Called into OR to let Dr. Veverly Fells know that patient wears CPAP at home with 2L O2 bled into it.  Patient wearing 2L in PACU and when it is removed he drops into the high 70's quickly.  MDA says to place an order to admit patient.  Will put order in and let family know.  Patient is also having significant pain.

## 2022-07-08 NOTE — Progress Notes (Signed)
Pt refusing cpap for the night. ?

## 2022-07-08 NOTE — Progress Notes (Signed)
Updated wife that patient will not be going home as expected.  Will update her again when we have a room assignment.

## 2022-07-08 NOTE — Brief Op Note (Signed)
07/08/2022  11:00 AM  PATIENT:  Alex [name].  82 y.o. male  PRE-OPERATIVE DIAGNOSIS:  right shoulder osteoarthritis, rotator cuff tear arthropathy  POST-OPERATIVE DIAGNOSIS:  right shoulder osteoarthritis, rotator cuff tear arthropathy  PROCEDURE:  Procedure(s): REVERSE SHOULDER ARTHROPLASTY (Right) DePuy Delta Xtend with NO subscap repair  SURGEON:  Surgeon(s) and Role:    Netta Cedars, MD - Primary  PHYSICIAN ASSISTANT:   ASSISTANTS: [place] Bruns, [name]   ANESTHESIA:   general and local  EBL:  100 mL   BLOOD ADMINISTERED:none  DRAINS: none   LOCAL MEDICATIONS USED:  MARCAINE     SPECIMEN:  No Specimen  DISPOSITION OF SPECIMEN:  N/A  COUNTS:  YES  TOURNIQUET:  * No tourniquets in log *  DICTATION: .Other Dictation: Dictation Number [id number]  PLAN OF CARE: Discharge to home after PACU  PATIENT DISPOSITION:  PACU - hemodynamically stable.   Delay start of Pharmacological VTE agent (>24hrs) due to surgical blood loss or risk of bleeding: not applicable

## 2022-07-08 NOTE — Transfer of Care (Signed)
Immediate Anesthesia Transfer of Care Note  Patient: Alex [name].  Procedure(s) Performed: REVERSE SHOULDER ARTHROPLASTY (Right: Shoulder)  Patient Location: PACU  Anesthesia Type:General  Level of Consciousness: drowsy and patient cooperative  Airway & Oxygen Therapy: Patient Spontanous Breathing and Patient connected to face mask oxygen  Post-op Assessment: Report given to RN and Post -op Vital signs reviewed and stable  Post vital signs: Reviewed and stable  Last Vitals:  Vitals Value Taken Time  BP 176/92 07/08/22 1112  Temp    Pulse 51 07/08/22 1115  Resp 20 07/08/22 1115  SpO2 100 % 07/08/22 1115  Vitals shown include unvalidated device data.  Last Pain:  Vitals:   07/08/22 0707  TempSrc:   PainSc: 0-No pain         Complications: No notable events documented.

## 2022-07-08 NOTE — Interval H&P Note (Signed)
History and Physical Interval Note:  07/08/2022 7:07 AM  Alex [name].  has presented today for surgery, with the diagnosis of right shoulder osteoarthritis.  The various methods of treatment have been discussed with the patient and family. After consideration of risks, benefits and other options for treatment, the patient has consented to  Procedure(s): REVERSE SHOULDER ARTHROPLASTY (Right) as a surgical intervention.  The patient's history has been reviewed, patient examined, no change in status, stable for surgery.  I have reviewed the patient's chart and labs.  Questions were answered to the patient's satisfaction.     Augustin Schooling

## 2022-07-09 DIAGNOSIS — M19011 Primary osteoarthritis, right shoulder: Secondary | ICD-10-CM | POA: Diagnosis not present

## 2022-07-09 MED ORDER — TRAMADOL HCL 50 MG PO TABS: 50.0000 mg | ORAL_TABLET | Freq: Four times a day (QID) | ORAL | 0 refills | Status: AC | PRN

## 2022-07-09 NOTE — Evaluation (Signed)
Occupational Therapy Evaluation Patient Details Name: Alex [name]. MRN: [id number] DOB: January 30, 1941 Today's Date: 07/09/2022   History of Present Illness Patient is a 82 year old male who presented to hospital on 3/15 with right shoulder osteoarthritis with rotator cuff tear arthropathy. Patient underwent a right shoulder arthroplasty. [id number], nephrotic syndrome, on immunosuppressive therapy and steroids, hypertension, coronary artery disease, hearing loss, cervical fusion, rt foot surgery x2, bil RTC repair, lt knee surgery, tigger finger releases   Clinical Impression   Patient is s/p shoulder replacement without functional use of right dominant upper extremity secondary to effects of surgery and shoulder precautions. Therapist provided education and instruction to patient and spouse in regards to exercises, precautions, positioning, donning upper extremity clothing and bathing while maintaining shoulder precautions, ice and edema management and donning/doffing sling. Patient and spouse verbalized understanding and demonstrated as needed. Patient needed assistance to donn shirt, underwear, pants, socks and shoes and provided with instruction on compensatory strategies to perform ADLs. Patient to follow up with MD for further therapy needs.        Recommendations for follow up therapy are one component of a multi-disciplinary discharge planning process, led by the attending physician.  Recommendations may be updated based on patient status, additional functional criteria and insurance authorization.   Follow Up Recommendations  Follow physician's recommendations for discharge plan and follow up therapies     Assistance Recommended at Discharge Frequent or constant Supervision/Assistance  Patient can return home with the following A little help with walking and/or transfers;A little help with bathing/dressing/bathroom;Direct supervision/assist for financial management;Help with  stairs or ramp for entrance;Assist for transportation;Direct supervision/assist for medications management;Assistance with cooking/housework    Functional Status Assessment  Patient has had a recent decline in their functional status and demonstrates the ability to make significant improvements in function in a reasonable and predictable amount of time.  Equipment Recommendations  None recommended by OT       Precautions / Restrictions Precautions Precautions: Shoulder Type of Shoulder Precautions: ok hand to face and minimal WB per Dr.Norris, AROM elbow hand and wrist, no ROM shoulder Shoulder Interventions: Shoulder sling/immobilizer;For comfort (and sleep) Precaution Booklet Issued: Yes (comment) (handouts) Restrictions Weight Bearing Restrictions: Yes RUE Weight Bearing: Non weight bearing      Mobility Bed Mobility Overal bed mobility: Needs Assistance Bed Mobility: Supine to Sit     Supine to sit: Min assist     General bed mobility comments: with increased time and HOB raised. patient plans to sleep in recliner        Balance Overall balance assessment: Mild deficits observed, not formally tested             ADL either performed or assessed with clinical judgement   ADL Overall ADL's : Needs assistance/impaired Eating/Feeding: Sitting;Modified independent   Grooming: Set up;Sitting   Upper Body Bathing: Maximal assistance;Sitting;Cueing for UE precautions Upper Body Bathing Details (indicate cue type and reason): with educcation provided to wife and patient on how to complete process. simulated task Lower Body Bathing: Minimal assistance;Sitting/lateral leans   Upper Body Dressing : Moderate assistance;Sitting Upper Body Dressing Details (indicate cue type and reason): with education on how to don shirt. patient and wife verbalized understanding. Lower Body Dressing: Maximal assistance;Sitting/lateral leans;Sit to/from stand Lower Body Dressing Details  (indicate cue type and reason): wife able to assist Toilet Transfer: Supervision/safety;Ambulation Toilet Transfer Details (indicate cue type and reason): with no AD in hallway with one moment of unsteadiness. patient was educated  on using rollator a home with LUE to lock/unlock breaks as MD allowed minimal WB. patient attemped to use RW but declined with reports of increased pain in shoulder with attempt. patient plans to use cane at home Toileting- Clothing Manipulation and Hygiene: Minimal assistance;Sit to/from stand         General ADL Comments: patient and wife were educated on sling being for comfort and strategies to maintain shoulder restrictions during ADLs. patient and wife verbalized understanding. patient was educated on falling being detrimental to healing process and ECT strategies. patient verbalized understanding. wife reported that she would be in charge of small dog at home during this time.     Vision Baseline Vision/History: 1 Wears glasses              Pertinent Vitals/Pain Pain Assessment Pain Assessment: Faces Faces Pain Scale: Hurts even more Pain Location: R shoulder with movement Pain Descriptors / Indicators: Grimacing, Guarding Pain Intervention(s): Limited activity within patient's tolerance, Premedicated before session, Monitored during session, Repositioned     Hand Dominance Right   Extremity/Trunk Assessment Upper Extremity Assessment Upper Extremity Assessment: RUE deficits/detail RUE Deficits / Details: able to wiggle digits and supinate limitedly, patient reported he did not have a block on shoulder.   Lower Extremity Assessment Lower Extremity Assessment: Overall WFL for tasks assessed   Cervical / Trunk Assessment Cervical / Trunk Assessment: Normal   Communication Communication Communication: HOH   Cognition Arousal/Alertness: Awake/alert Behavior During Therapy: WFL for tasks assessed/performed Overall Cognitive Status: Within  Functional Limits for tasks assessed           General Comments: plesant and cooperative, wife present in room     General Comments       Exercises     Shoulder Instructions Shoulder Instructions Donning/doffing shirt without moving shoulder: Caregiver independent with task Method for sponge bathing under operated UE: Caregiver independent with task Donning/doffing sling/immobilizer: Caregiver independent with task Correct positioning of sling/immobilizer: Caregiver independent with task ROM for elbow, wrist and digits of operated UE: Modified independent Sling wearing schedule (on at all times/off for ADL's): Caregiver independent with task Proper positioning of operated UE when showering: Caregiver independent with task Positioning of UE while sleeping: Caregiver independent with task    Home Living Family/patient expects to be discharged to:: Private residence Living Arrangements: Spouse/significant other Available Help at Discharge: Family;Available 24 hours/day Type of Home: House Home Access: Stairs to enter [employer] of Steps: 1   Home Layout: One level     Bathroom Shower/Tub: Chief Strategy Officer: Rollator (4 wheels)          Prior Functioning/Environment Prior Level of Function : Independent/Modified Independent                        OT Problem List: Impaired UE functional use;Pain;Decreased safety awareness;Impaired balance (sitting and/or standing);Decreased coordination;Decreased knowledge of precautions      OT Treatment/Interventions: Self-care/ADL training;Energy conservation;Therapeutic exercise;DME and/or AE instruction;Therapeutic activities;Patient/family education;Balance training    OT Goals(Current goals can be found in the care plan section) Acute Rehab OT Goals Patient Stated Goal: to go home OT Goal Formulation: With patient/family Time For Goal Achievement: 07/23/22 Potential to Achieve  Goals: Fair  OT Frequency: Min 2X/week       AM-PAC OT "6 Clicks" Daily Activity     Outcome Measure Help from another person eating meals?: None Help from another person taking  care of personal grooming?: A Little Help from another person toileting, which includes using toliet, bedpan, or urinal?: A Little Help from another person bathing (including washing, rinsing, drying)?: A Lot Help from another person to put on and taking off regular upper body clothing?: A Lot Help from another person to put on and taking off regular lower body clothing?: A Lot 6 Click Score: 16   End of Session Nurse Communication: Other (comment) (ok to participate in session)  Activity Tolerance: Patient tolerated treatment well Patient left: in bed;with call bell/phone within reach;with family/visitor present  OT Visit Diagnosis: Unsteadiness on feet (R26.81);Pain Pain - Right/Left: Right Pain - part of body: Shoulder                Time: [id number] OT Time Calculation (min): 50 min Charges:  OT General Charges $OT Visit: 1 Visit OT Evaluation $OT Eval Low Complexity: 1 Low OT Treatments $Self Care/Home Management : 23-37 mins  Arwin Bisceglia OTR/L, MS Acute Rehabilitation Department Office# [phone]   Willa Rough 07/09/2022, 9:53 AM

## 2022-07-09 NOTE — Discharge Summary (Signed)
Patient ID: Alex [name]. MRN: [id number] DOB/AGE: [birth year] 80 y.o.  Admit date: 07/08/2022 Discharge date: 07/09/2022  Admission Diagnoses:  Principal Problem:   Status post reverse arthroplasty of right shoulder Active Problems:   S/P shoulder replacement, right   Discharge Diagnoses:  Same  Past Medical History:  Diagnosis Date   AL amyloidosis [facility]) oncologist-- Alex Alex [name]   Renal and Systemic , dx 04/ 2018;  renal bx 08-05-2016 and bone marrow bx 08-29-2016;  8 month chemotherapy then stell cell transplant 12/ 2018 then chemo again;  since then been on oral chemo daily (11-07-2019 per pt has been on hold until after prostate seed implants)   Arthritis    Atypical chest pain    CAD (coronary artery disease) cardiologist-- Alex Alex [name];  also followed by cardiologist w/ [facility] in [place], Alex Alex [name]   moderate nonobstrucive disease by cath 2011; Lexiscan Myoview 6/14--Normal study, no scar or ischemia, EF 62%;  11-14-2014 nuclear study low risk w/ normal perfusion, nuclear ef 67%   CKD (chronic kidney disease), stage III (HCC)    COVID    b/l PNA (7/20)- 21 day hospitalization tx with steroids, Remdesevir, Actemra, Hydroxychloroquine) --> PE on Eliquis.   Diverticulosis of colon    Dyspnea    WITH EXERTION    ED (erectile dysfunction)    Fatty liver    GERD (gastroesophageal reflux disease)    H/O stem cell transplant ([place]) 03/2017   Headache    Heart murmur    Hiatal hernia    History of 2019 novel coronavirus disease (COVID-19) 09/2018   [facility], hospital admission 10-28-2019 respiratory failure/ pneumometrostinom/ PE ;  was not intubated , treatment with remdesivir/ steroids/ actemra/ plasma   History of basal cell carcinoma (BCC)    History of colon polyps    History of MRSA infection    culture positive   History of pulmonary embolus (PE) 10/2018   with covid 19, completed treatment with eliquis   History of seizures     11-07-2019  per pt last one 1970s, unknown cause   History of vertebral compression fracture 1988   lumbar   Hx of adenomatous colonic polyps    negative in 7/08   Hyperlipidemia    Hypertension    IFG (impaired fasting glucose)    Neuropathy due to chemotherapeutic drug (Alex [name])    fingers tips and feet   Nocturia    On supplemental oxygen therapy    post covid 19;   11-07-2019 pt uses O2 every night 2L via [place],  and uses supplements during the day when walking around neighborhood and goes out to appointments/ store that he has to walk a distance   OSA (obstructive sleep apnea)    study in epic 03-18-2005 mild to moderate   (11-07-2019  per pt used cpap up until when he lost alot of weight, stoped approx. 2012   Pneumonia    Prostate cancer [facility]) urologist--- Alex [name]/  oncology-- Alex Alex [name]   dx 03/ 2021   Pulmonary fibrosis ([place]) 09/2018   pulmonologist-- Alex Alex [name];   post covid 19   Seizure (Alex [name])    1st in 1976; last in 1978 GTC x 2    SOB (shortness of breath) on exertion    11-07-2019  per pt walks around neighborhood for 30 minutes twice daily, he does have to stop frequently d/t sob and since weather has been hot he uses his supplemental oxygen;  stated  he has a pulse ox. at home , when he is sitting down O2 sat 96 -97% on RA when up walking around the house O2 sat 93-94% on RA;  pt stated he carry's his O2 w/ him when he goes to doctor appts/store d/t distance   Wears glasses    Wears hearing aid in both ears     Surgeries: Procedure(s): [place] on 07/08/2022   Consultants:   Discharged Condition: Improved  Hospital Course: Alex [name]. is an 82 y.o. male who was admitted 07/08/2022 for operative treatment ofStatus post reverse arthroplasty of right shoulder. Patient has severe unremitting pain that affects sleep, daily activities, and work/hobbies. After pre-op clearance the patient was taken to the operating room on 07/08/2022 and underwent   Procedure(s): [place].    Patient was given perioperative antibiotics:  Anti-infectives (From admission, onward)    Start     Dose/Rate Route Frequency Ordered Stop   07/08/22 0630  ceFAZolin (ANCEF) IVPB 2g/100 mL premix        2 g 200 mL/hr over 30 Minutes Intravenous On call to O.R. 07/08/22 0617 07/08/22 1001        Patient was given sequential compression devices, early ambulation, and chemoprophylaxis to prevent DVT.  Patient benefited maximally from hospital stay and there were no complications.    Recent vital signs: Patient Vitals for the past 24 hrs:  BP Temp Temp src Pulse Resp SpO2 Height Weight  07/09/22 0531 107/68 97.9 F (36.6 C) Oral 64 16 95 % -- --  07/09/22 0148 102/71 98 F (36.7 C) Oral 63 18 96 % -- --  07/08/22 2040 134/83 98.1 F (36.7 C) Oral 67 18 98 % -- --  07/08/22 1623 (!) 142/88 97.8 F (36.6 C) Oral 65 14 99 % -- --  07/08/22 1420 -- -- -- -- -- -- 5\' 10"  (1.778 m) 88 kg  07/08/22 1412 (!) 150/95 97.6 F (36.4 C) Oral (!) 56 16 100 % -- --  07/08/22 1330 (!) 159/89 -- -- (!) 55 10 100 % -- --  07/08/22 1315 (!) 161/90 -- -- (!) 54 10 100 % -- --  07/08/22 1300 -- -- -- (!) 54 (!) 8 100 % -- --  07/08/22 1245 (!) 162/88 -- -- (!) 54 14 100 % -- --  07/08/22 1230 (!) 147/91 -- -- (!) 53 12 100 % -- --  07/08/22 1215 (!) 165/86 -- -- (!) 55 17 100 % -- --  07/08/22 1200 (!) 148/90 -- -- (!) 53 16 100 % -- --  07/08/22 1145 (!) 144/78 -- -- (!) 52 12 100 % -- --  07/08/22 1130 (!) 147/83 -- -- (!) 57 14 99 % -- --  07/08/22 1115 (!) 169/89 -- -- (!) 51 20 100 % -- --  07/08/22 1110 (!) 176/92 97.7 F (36.5 C) -- (!) 57 18 100 % -- --     Recent laboratory studies: No results for input(s): "WBC", "HGB", "HCT", "PLT", "NA", "K", "CL", "CO2", "BUN", "CREATININE", "GLUCOSE", "INR", "CALCIUM" in the last 72 hours.  Invalid input(s): "PT", "2"   Discharge Medications:   Allergies as of 07/09/2022       Reactions    Ramipril Cough   Other reaction(s): Cough (ALLERGY/intolerance)   Augmentin [amoxicillin-pot Clavulanate] Diarrhea        Medication List     TAKE these medications    albuterol 1.25 MG/3ML nebulizer solution Commonly known as: ACCUNEB Take  1 ampule by nebulization every 6 (six) hours as needed for shortness of breath or wheezing.   atorvastatin 40 MG tablet Commonly known as: LIPITOR Take 40 mg by mouth daily.   capsaicin 0.025 % cream Commonly known as: ZOSTRIX Apply 1 Application topically as needed (aches and pains).   carvedilol 6.25 MG tablet Commonly known as: COREG Take 3.125-6.25 mg by mouth See admin instructions. 6.25 mg in the morning, 3.125 mg in the evening   Cholecalciferol 125 MCG (5000 UT) Tabs Take 5,000 Units by mouth 2 (two) times a day.   Combivent Respimat 20-100 MCG/ACT Aers respimat Generic drug: Ipratropium-Albuterol Inhale 1 puff into the lungs every 6 (six) hours as needed for wheezing or shortness of breath.   Eliquis 5 MG Tabs tablet Generic drug: apixaban Take 5 mg by mouth 2 (two) times daily.   ezetimibe 10 MG tablet Commonly known as: ZETIA Take 10 mg by mouth daily.   fluticasone 50 MCG/ACT nasal spray Commonly known as: FLONASE Place 2 sprays into both nostrils daily.   furosemide 40 MG tablet Commonly known as: LASIX Take 40 mg by mouth daily.   isosorbide mononitrate 30 MG 24 hr tablet Commonly known as: IMDUR Take 30 mg by mouth daily.   lidocaine 5 % Commonly known as: LIDODERM Place 1 patch onto the skin daily as needed (pain).   Multi-Vitamins Tabs Take 1 tablet by mouth at bedtime.   mupirocin ointment 2 % Commonly known as: BACTROBAN Apply 1 Application topically daily as needed (wound care).   nitroGLYCERIN 0.4 MG SL tablet Commonly known as: NITROSTAT DISSOLVE ONE TABLET UNDER TONGUE EVERY 5 MINUTES AS NEEDED UP TO 3 DOSES, IF NORELIEF CALL 911   pantoprazole 40 MG tablet Commonly known as:  PROTONIX Take 40 mg by mouth daily.   potassium chloride SA 20 MEQ tablet Commonly known as: KLOR-CON M Take 20 mEq by mouth daily.   PROBIOTIC-10 PO Take 1 capsule by mouth daily.   tamsulosin 0.4 MG Caps capsule Commonly known as: FLOMAX Take 0.4 mg by mouth 2 (two) times daily.   traMADol 50 MG tablet Commonly known as: Ultram Take 1 tablet (50 mg total) by mouth every 6 (six) hours as needed.   triamcinolone cream 0.1 % Commonly known as: KENALOG Apply 1 Application topically daily as needed (irritation).   vitamin B-12 250 MCG tablet Commonly known as: CYANOCOBALAMIN Take 500 mcg by mouth daily.   vitamin C 1000 MG tablet Take 1,000 mg by mouth daily.   zinc gluconate 50 MG tablet Take 50 mg by mouth daily.        Diagnostic Studies: DG Shoulder Right Port  Result Date: 07/08/2022 CLINICAL DATA:  Status post reverse total right shoulder arthroplasty. EXAM: RIGHT SHOULDER - 1 VIEW COMPARISON:  None Available. FINDINGS: Single frontal view of the right shoulder. Status post recent reverse total right shoulder arthroplasty. No perihardware lucency is seen to indicate hardware failure or loosening. Expected postoperative subcutaneous and subacromial/subdeltoid bursal air. Severe acromioclavicular joint space narrowing and peripheral osteophytosis. Mildly decreased lung volumes. Moderate multilevel disc space narrowing and endplate osteophytes of the thoracic spine. No acute fracture or dislocation. IMPRESSION: Status post recent reverse total right shoulder arthroplasty. No evidence of hardware failure. Electronically Signed   By: Yvonne Kendall M.D.   On: 07/08/2022 11:50   ECHOCARDIOGRAM COMPLETE  Result Date: 06/10/2022    ECHOCARDIOGRAM REPORT   Patient Name:   Alex [name]. Date of Exam: 06/10/2022 Medical Rec #:  [id number]             Height:       71.0 in Accession #:    [id number]            Weight:       193.0 lb Date of Birth:  18-Jan-1941             BSA:           2.077 m Patient Age:    51 years              BP:           119/72 mmHg Patient Gender: M                     HR:           91 bpm. Exam Location:  [facility] Procedure: 2D Echo, Cardiac Doppler, Color Doppler and 3D Echo Indications:    Aortic Stenosis i35.0  History:        Patient has prior history of Echocardiogram examinations, most                 recent 05/31/2021. CAD, Carotid Disease and S/p stem cell                 transplant. Amyloidosis.; Risk Factors:Hypertension and                 Dyslipidemia.  Sonographer:    Mikki Santee RDCS Referring Phys: [place]  1. Left ventricular ejection fraction, by estimation, is 50%. The left ventricle has mildly decreased function. The left ventricle demonstrates global hypokinesis. There is mild concentric left ventricular hypertrophy. Left ventricular diastolic parameters are consistent with Grade I diastolic dysfunction (impaired relaxation).  2. Right ventricular systolic function is mildly reduced. The right ventricular size is normal. There is normal pulmonary artery systolic pressure. The estimated right ventricular systolic pressure is 123456 mmHg.  3. The mitral valve is normal in structure. No evidence of mitral valve regurgitation. No evidence of mitral stenosis.  4. The aortic valve is tricuspid. There is moderate calcification of the aortic valve. Aortic valve regurgitation is mild. Moderate aortic valve stenosis by doppler indices though visually could be severe. Aortic valve area, by VTI measures 1.17 cm. Aortic valve mean gradient measures 12.0 mmHg.  5. There is mild dilatation of the ascending aorta, measuring 39 mm.  6. The inferior vena cava is normal in size with greater than 50% respiratory variability, suggesting right atrial pressure of 3 mmHg. FINDINGS  Left Ventricle: Left ventricular ejection fraction, by estimation, is 50%. The left ventricle has mildly decreased function. The left ventricle  demonstrates global hypokinesis. The left ventricular internal cavity size was normal in size. There is mild concentric left ventricular hypertrophy. Left ventricular diastolic parameters are consistent with Grade I diastolic dysfunction (impaired relaxation). Right Ventricle: The right ventricular size is normal. No increase in right ventricular wall thickness. Right ventricular systolic function is mildly reduced. There is normal pulmonary artery systolic pressure. The tricuspid regurgitant velocity is 1.70 m/s, and with an assumed right atrial pressure of 3 mmHg, the estimated right ventricular systolic pressure is 123456 mmHg. Left Atrium: Left atrial size was normal in size. Right Atrium: Right atrial size was normal in size. Pericardium: There is no evidence of pericardial effusion. Mitral Valve: The mitral valve is normal in structure. There is mild calcification of the mitral valve leaflet(s). Mild mitral annular calcification.  No evidence of mitral valve regurgitation. No evidence of mitral valve stenosis. Tricuspid Valve: The tricuspid valve is normal in structure. Tricuspid valve regurgitation is trivial. Aortic Valve: The aortic valve is tricuspid. There is moderate calcification of the aortic valve. Aortic valve regurgitation is mild. Moderate aortic stenosis is present. Aortic valve mean gradient measures 12.0 mmHg. Aortic valve peak gradient measures 21.0 mmHg. Aortic valve area, by VTI measures 1.17 cm. Pulmonic Valve: The pulmonic valve was normal in structure. Pulmonic valve regurgitation is trivial. Aorta: The aortic root is normal in size and structure. There is mild dilatation of the ascending aorta, measuring 39 mm. Venous: The inferior vena cava is normal in size with greater than 50% respiratory variability, suggesting right atrial pressure of 3 mmHg. IAS/Shunts: No atrial level shunt detected by color flow Doppler.  LEFT VENTRICLE PLAX 2D LVIDd:         4.30 cm   Diastology LVIDs:          3.15 cm   LV e' medial:    4.64 cm/s LV PW:         1.20 cm   LV E/e' medial:  10.2 LV IVS:        1.30 cm   LV e' lateral:   6.13 cm/s LVOT diam:     2.20 cm   LV E/e' lateral: 7.7 LV SV:         63 LV SV Index:   31 LVOT Area:     3.80 cm                           3D Volume EF:                          3D EF:        50 %                          LV EDV:       110 ml                          LV ESV:       55 ml                          LV SV:        55 ml RIGHT VENTRICLE RV Basal diam:  3.50 cm RV Mid diam:    3.40 cm RV S prime:     8.59 cm/s TAPSE (M-mode): 2.0 cm LEFT ATRIUM             Index        RIGHT ATRIUM           Index LA diam:        3.20 cm 1.54 cm/m   RA Area:     16.50 cm LA Vol (A2C):   35.0 ml 16.85 ml/m  RA Volume:   39.40 ml  18.97 ml/m LA Vol (A4C):   54.1 ml 26.05 ml/m LA Biplane Vol: 48.3 ml 23.26 ml/m  AORTIC VALVE AV Area (Vmax):    1.07 cm AV Area (Vmean):   1.02 cm AV Area (VTI):     1.17 cm AV Vmax:           229.00 cm/s AV Vmean:  161.000 cm/s AV VTI:            0.543 m AV Peak Grad:      21.0 mmHg AV Mean Grad:      12.0 mmHg LVOT Vmax:         64.50 cm/s LVOT Vmean:        43.000 cm/s LVOT VTI:          0.167 m LVOT/AV VTI ratio: 0.31  AORTA Ao Root diam: 3.10 cm Ao Asc diam:  3.90 cm MITRAL VALVE               TRICUSPID VALVE MV Area (PHT): 2.66 cm    TR Peak grad:   11.6 mmHg MV Decel Time: 285 msec    TR Vmax:        170.00 cm/s MV E velocity: 47.20 cm/s MV A velocity: 78.10 cm/s  SHUNTS MV E/A ratio:  0.60        Systemic VTI:  0.17 m                            Systemic Diam: 2.20 cm Dalton McleanMD Electronically signed by Franki Monte Signature Date/Time: 06/10/2022/1:55:07 PM    Final     Disposition: Discharge disposition: 01-Home or Self Care       Discharge Instructions     Call MD / Call 911   Complete by: As directed    If you experience chest pain or shortness of breath, CALL 911 and be transported to the hospital emergency room.  If you  develope a fever above 101 F, pus (white drainage) or increased drainage or redness at the wound, or calf pain, call your surgeon's office.   Constipation Prevention   Complete by: As directed    Drink plenty of fluids.  Prune juice may be helpful.  You may use a stool softener, such as Colace (over the counter) 100 mg twice a day.  Use MiraLax (over the counter) for constipation as needed.   Diet - low sodium heart healthy   Complete by: As directed    Increase activity slowly as tolerated   Complete by: As directed    Post-operative opioid taper instructions:   Complete by: As directed    POST-OPERATIVE OPIOID TAPER INSTRUCTIONS: It is important to wean off of your opioid medication as soon as possible. If you do not need pain medication after your surgery it is ok to stop day one. Opioids include: Codeine, Hydrocodone(Norco, Vicodin), Oxycodone(Percocet, oxycontin) and hydromorphone amongst others.  Long term and even short term use of opiods can cause: Increased pain response Dependence Constipation Depression Respiratory depression And more.  Withdrawal symptoms can include Flu like symptoms Nausea, vomiting And more Techniques to manage these symptoms Hydrate well Eat regular healthy meals Stay active Use relaxation techniques(deep breathing, meditating, yoga) Do Not substitute Alcohol to help with tapering If you have been on opioids for less than two weeks and do not have pain than it is ok to stop all together.  Plan to wean off of opioids This plan should start within one week post op of your joint replacement. Maintain the same interval or time between taking each dose and first decrease the dose.  Cut the total daily intake of opioids by one tablet each day Next start to increase the time between doses. The last dose that should be eliminated is the evening dose.  Follow-up Information     Netta Cedars, MD. Call in 2 week(s).   Specialty:  Orthopedic Surgery Why: call [phone] for appt Contact information: [address] [place] [address] [phone]                  Signed: Cecilie Kicks 07/09/2022, 8:35 AM

## 2022-07-09 NOTE — Progress Notes (Signed)
Orthopedics Progress Note  Subjective: Patient feeling good this AM with minimal pain  Objective:  Vitals:   07/09/22 0148 07/09/22 0531  BP: 102/71 107/68  Pulse: 63 64  Resp: 18 16  Temp: 98 F (36.7 C) 97.9 F (36.6 C)  SpO2: 96% 95%    General: Awake and alert  Musculoskeletal: Right shoulder dressing changed, wound CDI Neurovascularly intact  Lab Results  Component Value Date   WBC 7.0 06/28/2022   HGB 16.6 06/28/2022   HCT 49.3 06/28/2022   MCV 92.7 06/28/2022   PLT 152 06/28/2022       Component Value Date/Time   NA 143 06/28/2022 1132   NA 139 11/03/2020 1614   NA 142 01/30/2017 1331   K 3.8 06/28/2022 1132   K 3.7 01/30/2017 1331   CL 107 06/28/2022 1132   CO2 25 06/28/2022 1132   CO2 26 01/30/2017 1331   GLUCOSE 101 (H) 06/28/2022 1132   GLUCOSE 111 01/30/2017 1331   BUN 26 (H) 06/28/2022 1132   BUN 36 (H) 11/03/2020 1614   BUN 20.0 01/30/2017 1331   CREATININE 1.27 (H) 06/28/2022 1132   CREATININE 1.30 (H) 05/27/2021 1136   CREATININE 1.2 01/30/2017 1331   CALCIUM 9.3 06/28/2022 1132   CALCIUM 9.1 01/30/2017 1331   GFRNONAA 57 (L) 06/28/2022 1132   GFRNONAA 56 (L) 05/27/2021 1136   GFRAA 58 (L) 12/04/2019 1126    Lab Results  Component Value Date   INR 1.5 (H) 04/29/2022   INR 1.0 11/11/2019   INR 1.2 11/03/2018   PROTIME 12.0 08/19/2016    Assessment/Plan: POD #1 s/p Procedure(s): REVERSE SHOULDER ARTHROPLASTY Stable this AM. PLan d/c home after therapy Follow up in two weeks  Doran Heater. Veverly Fells, MD 07/09/2022 8:16 AM

## 2022-07-09 NOTE — Progress Notes (Signed)
The patient is alert and oriented and has been seen by his physician. The orders for discharge were written. IV has been removed. Aquacel dressing is clean, dry, and intact. Went over discharge instructions with patient and family. He is being discharged via wheelchair with all of his belongings.   

## 2022-07-09 NOTE — Plan of Care (Signed)
  Problem: Pain Management: Goal: Pain level will decrease with appropriate interventions Outcome: Progressing   Problem: Education: Goal: Knowledge of General Education information will improve Description: Including pain rating scale, medication(s)/side effects and non-pharmacologic comfort measures Outcome: Progressing   Problem: Health Behavior/Discharge Planning: Goal: Ability to manage health-related needs will improve Outcome: Progressing   Problem: Activity: Goal: Risk for activity intolerance will decrease Outcome: Progressing   Problem: Pain Managment: Goal: General experience of comfort will improve Outcome: Progressing   

## 2022-07-11 ENCOUNTER — Encounter (HOSPITAL_COMMUNITY): Payer: Self-pay | Admitting: Orthopedic Surgery

## 2023-01-04 ENCOUNTER — Telehealth: Payer: Self-pay | Admitting: Cardiovascular Disease

## 2023-01-04 NOTE — Telephone Encounter (Signed)
Office calling to get a copy of patient's last two office notes. Fax # [phone]. Please advise

## 2023-01-13 ENCOUNTER — Emergency Department (HOSPITAL_BASED_OUTPATIENT_CLINIC_OR_DEPARTMENT_OTHER): Payer: No Typology Code available for payment source

## 2023-01-13 ENCOUNTER — Emergency Department (HOSPITAL_BASED_OUTPATIENT_CLINIC_OR_DEPARTMENT_OTHER)
Admission: EM | Admit: 2023-01-13 | Discharge: 2023-01-13 | Disposition: A | Discharge status: Home / Self Care | Payer: No Typology Code available for payment source | Attending: Emergency Medicine | Admitting: Emergency Medicine

## 2023-01-13 ENCOUNTER — Other Ambulatory Visit: Payer: Self-pay

## 2023-01-13 DIAGNOSIS — S8992XA Unspecified injury of left lower leg, initial encounter: Secondary | ICD-10-CM | POA: Insufficient documentation

## 2023-01-13 DIAGNOSIS — I129 Hypertensive chronic kidney disease with stage 1 through stage 4 chronic kidney disease, or unspecified chronic kidney disease: Secondary | ICD-10-CM | POA: Insufficient documentation

## 2023-01-13 DIAGNOSIS — Z7901 Long term (current) use of anticoagulants: Secondary | ICD-10-CM | POA: Insufficient documentation

## 2023-01-13 DIAGNOSIS — X509XXA Other and unspecified overexertion or strenuous movements or postures, initial encounter: Secondary | ICD-10-CM | POA: Diagnosis not present

## 2023-01-13 DIAGNOSIS — N183 Chronic kidney disease, stage 3 unspecified: Secondary | ICD-10-CM | POA: Diagnosis not present

## 2023-01-13 DIAGNOSIS — Z8546 Personal history of malignant neoplasm of prostate: Secondary | ICD-10-CM | POA: Insufficient documentation

## 2023-01-13 DIAGNOSIS — Z79899 Other long term (current) drug therapy: Secondary | ICD-10-CM | POA: Diagnosis not present

## 2023-01-13 DIAGNOSIS — I2510 Atherosclerotic heart disease of native coronary artery without angina pectoris: Secondary | ICD-10-CM | POA: Insufficient documentation

## 2023-01-13 DIAGNOSIS — M25462 Effusion, left knee: Secondary | ICD-10-CM

## 2023-01-13 DIAGNOSIS — S8991XD Unspecified injury of right lower leg, subsequent encounter: Secondary | ICD-10-CM

## 2023-01-13 NOTE — Discharge Instructions (Addendum)
Thank you for letting us take care of you today.  Your ultrasound for a possible clot in your leg is negative.  [facility] for [facility] for possible ligamentous or meniscal injury.  Continue to wear left knee brace and reduce pressure on left leg.  Return to emergency department if you experience redness, worsening tenderness, worsening swelling, heat to left leg or have fever greater than 100.4 F

## 2023-01-13 NOTE — ED Provider Notes (Addendum)
[place] [facility]PARTMENT AT [facility] Provider Note   CSN: [id number] Arrival date & time: 01/13/23  1646     History  Chief Complaint  Patient presents with   Knee Pain    Alex Spillers Wonda Cerise. is a 82 y.o. male with past medical history of AAA, HTN, history of prostate cancer, CAD, HLD, CKD stage III, AA history of PE, aortic stenosis presents to the emergency department following a left knee injury.  He reports that he attempted to try to get tne foot rest of his reclining chair up with his left foot when his wife heard a pop.  Following, he had pain with ambulation, swelling, and tenderness to knee.  He was seen at the [facility] on 01/02/23 following injury. X-ray was negative for fracture but significant for joint effusion.  Today, he reports continued pain and swelling of left knee and lower extremity.  He states that he may need an MRI.  He is wearing a left knee brace and has been using wheelchair for aid in ambulation.  He denies chest pain, shortness of breath, fever.   Knee Pain     Home Medications Prior to Admission medications   Medication Sig Start Date End Date Taking? Authorizing Provider  albuterol (ACCUNEB) 1.25 MG/3ML nebulizer solution Take 1 ampule by nebulization every 6 (six) hours as needed for shortness of breath or wheezing. 01/11/22   [provider]  apixaban (ELIQUIS) 5 MG TABS tablet Take 5 mg by mouth 2 (two) times daily.    [provider]  Ascorbic Acid (VITAMIN C) 1000 MG tablet Take 1,000 mg by mouth daily.    [provider]  atorvastatin (LIPITOR) 40 MG tablet Take 40 mg by mouth daily.    [provider]  capsaicin (ZOSTRIX) 0.025 % cream Apply 1 Application topically as needed (aches and pains). 06/16/21   [provider]  carvedilol (COREG) 6.25 MG tablet Take 3.125-6.25 mg by mouth See admin instructions. 6.25 mg in the morning, 3.125 mg in the evening    [provider]   Cholecalciferol 125 MCG (5000 UT) TABS Take 5,000 Units by mouth 2 (two) times a day.    [provider]  ezetimibe (ZETIA) 10 MG tablet Take 10 mg by mouth daily. 10/28/15   [provider]  fluticasone (FLONASE) 50 MCG/ACT nasal spray Place 2 sprays into both nostrils daily. 08/28/19   Mannam, Colbert Coyer, MD  furosemide (LASIX) 40 MG tablet Take 40 mg by mouth daily.     [provider]  Ipratropium-Albuterol (COMBIVENT RESPIMAT) 20-100 MCG/ACT AERS respimat Inhale 1 puff into the lungs every 6 (six) hours as needed for wheezing or shortness of breath.    [provider]  isosorbide mononitrate (IMDUR) 30 MG 24 hr tablet Take 30 mg by mouth daily.    [provider]  lidocaine (LIDODERM) 5 % Place 1 patch onto the skin daily as needed (pain). 03/18/22   [provider]  Multiple Vitamin (MULTI-VITAMINS) TABS Take 1 tablet by mouth at bedtime. 04/24/17   [provider]  mupirocin ointment (BACTROBAN) 2 % Apply 1 Application topically daily as needed (wound care). 04/24/18   [provider]  nitroGLYCERIN (NITROSTAT) 0.4 MG SL tablet DISSOLVE ONE TABLET UNDER TONGUE EVERY 5 MINUTES AS NEEDED UP TO 3 DOSES, IF NORELIEF CALL 911 04/28/22   Kathleene Hazel, MD  pantoprazole (PROTONIX) 40 MG tablet Take 40 mg by mouth daily.  08/28/18  [provider]  potassium chloride SA (KLOR-CON) 20 MEQ tablet Take 20 mEq by mouth daily.    [provider]  Probiotic Product (PROBIOTIC-10 PO) Take 1 capsule by mouth daily.    [provider]  tamsulosin (FLOMAX) 0.4 MG CAPS capsule Take 0.4 mg by mouth 2 (two) times daily.    [provider]  traMADol (ULTRAM) 50 MG tablet Take 1 tablet (50 mg total) by mouth every 6 (six) hours as needed. 07/09/22 07/09/23  Dorothy Spark, [name]  triamcinolone cream (KENALOG) 0.1 % Apply 1 Application topically daily as needed (irritation). 12/14/21   [provider]   vitamin B-12 (CYANOCOBALAMIN) 250 MCG tablet Take 500 mcg by mouth daily.    [provider]  zinc gluconate 50 MG tablet Take 50 mg by mouth daily.    [provider]      Allergies    Ramipril and Augmentin [amoxicillin-pot clavulanate]    Review of Systems   Review of Systems  Respiratory:  Negative for cough and shortness of breath.   Cardiovascular:  Negative for chest pain.  Musculoskeletal:  Positive for arthralgias and joint swelling.    Physical Exam Updated Vital Signs BP 114/80 (BP Location: Left Arm)   Pulse 83   Temp 98 F (36.7 C) (Oral)   Resp 20   SpO2 97%  Physical Exam Vitals and nursing note reviewed.  Constitutional:      General: He is not in acute distress.    Appearance: Normal appearance.  HENT:     Head: Normocephalic and atraumatic.  Eyes:     Conjunctiva/sclera: Conjunctivae normal.  Cardiovascular:     Rate and Rhythm: Normal rate.     Comments: Dorsalis pedis +2 and equal in lower extremities Pulmonary:     Effort: Pulmonary effort is normal. No respiratory distress.  Musculoskeletal:        General: Swelling and tenderness (Diffuse) present.     Comments: Inability to flex knee secondary to pain  Skin:    Capillary Refill: Capillary refill takes less than 2 seconds.     Coloration: Skin is not jaundiced or pale.     Comments: No skin breakdown to left lower extremity Yellowing of skin of left calf consistent with bruising Edema to left knee, calf, and ankle No erythema or warmth noted to left knee or left lower extremity  Neurological:     Mental Status: He is alert. Mental status is at baseline.     Motor: No weakness.     Comments: Decreased sensation to left lower extremity due to neuropathy and per patient's baseline     ED Results / Procedures / Treatments   Labs (all labs ordered are listed, but only abnormal results are displayed) Labs Reviewed - No data to display  EKG None  Radiology No results  found.  Procedures Procedures    Medications Ordered in ED Medications - No data to display  ED Course/ Medical Decision Making/ A&P                                 Medical Decision Making  Upon evaluation, patient is resting comfortably in bed.  He is not ill-appearing or in acute distress.  See HPI   Upon assessment, there is swelling to left knee and lower extremity.  Skin is yellow appearing to be consistent with old bruise.  Left lower extremity is not hot  to touch or erythematous.  Due to history of PE and unilateral LE edema, will obtain ultrasound of left lower extremity to rule out DVT.  At this time I am not concerned for septic joint as there is no erythema to LE, no warmth to area nor fever.  I do not think that lab work is necessary for treatment disposition plan.  Patient is hemodynamically stable with no fever, tachycardia and patient is not ill-appearing.  As patient obtain x-ray at [facility] urgent care which resulted with joint swelling but no acute fracture, I do not believe we need additional x-ray imaging.  Patient can obtain MRI with Ortho outpatient as injury, history seems to be high likelihood of a ligamentous or meniscal injury.  Patient expresses desire to obtain MRI as he believes that he may have a ligamental injury.  I explained to patient that we do not obtain MRIs for knee injuries in ER.  Will have patient follow-up [facility]. Discussed treatment plan with patient who expresses understanding and agrees with current plan.   Handoff to oncoming [facility] PA pending DVT study.  Assuming no DVT, patient can be discharged to follow-up with outpatient Ortho.     Final Clinical Impression(s) / ED Diagnoses Final diagnoses:  Injury of right knee, subsequent encounter    Rx / DC Orders ED Discharge Orders     None         Judithann Sheen, PA 01/13/23 1859    Judithann Sheen, PA 01/13/23 Angela Cox, MD 01/13/23 [phone]

## 2023-01-13 NOTE — ED Triage Notes (Signed)
Patient presents to ED via POV from home. Here with left knee pain. Patient twisted his knee on his lazy boy a week ago. Seen at [facility] with negative xrays. Ambulatory with walker.

## 2023-01-13 NOTE — ED Provider Notes (Signed)
  Physical Exam  BP 114/80 (BP Location: Left Arm)   Pulse 83   Temp 98 F (36.7 C) (Oral)   Resp 20   SpO2 97%   Physical Exam Vitals and nursing note reviewed.  Constitutional:      General: He is not in acute distress.    Appearance: Normal appearance. He is normal weight. He is not ill-appearing.  HENT:     Head: Normocephalic and atraumatic.  Pulmonary:     Effort: Pulmonary effort is normal. No respiratory distress.  Abdominal:     General: Abdomen is flat.  Musculoskeletal:        General: Normal range of motion.     Cervical back: Neck supple.     Comments: Left knee swollen when compared contralaterally.  No overlying skin changes.  No warmth.  Swelling does not extend to the calf.  He is ambulatory without difficulty.  Skin:    General: Skin is warm and dry.  Neurological:     Mental Status: He is alert and oriented to person, place, and time.  Psychiatric:        Mood and Affect: Mood normal.        Behavior: Behavior normal.     Procedures  Procedures  ED Course / MDM    Medical Decision Making  This patient presents to the ED for concern of left knee pain, this involves an extensive number of treatment options, and is a complaint that carries with it a high risk of complications and morbidity.  The differential diagnosis includes hemarthrosis, traumatic effusion, DVT, septic arthritis, RA, OA, fracture, strain, sprain  Care assumed at shift change.  Please see previous provider note for full HPI.  In short, 82 year old male presenting for left knee pain and Swelling.  This occurred 2 weeks ago after he was performing some seated stretching exercises.  Presented to [facility] urgent care and had a negative x-ray.  He denies any systemic infection symptoms.  There is some swelling of the left knee, however no overlying skin changes or warmth to suggest septic arthritis.  Overall suspect hemarthrosis versus traumatic effusion.  He has a primary orthopedic provider he would  like to follow-up with.  He was encouraged to do this as soon as possible.  Stable at discharge.  At this time there does not appear to be any evidence of an acute emergency medical condition and the patient appears stable for discharge with appropriate outpatient follow up. Diagnosis was discussed with patient who verbalizes understanding of care plan and is agreeable to discharge. I have discussed return precautions with patient and wife who verbalizes understanding. Patient encouraged to follow-up with their orthopedic provider within 1 week. All questions answered.  Note: Portions of this report may have been transcribed using voice recognition software. Every effort was made to ensure accuracy; however, inadvertent computerized transcription errors may still be present.       Mora Bellman 01/13/23 2022    Benjiman Core, MD 01/13/23 [phone]

## 2023-01-13 NOTE — ED Notes (Signed)
ED Provider at bedside. 

## 2023-01-18 ENCOUNTER — Ambulatory Visit (HOSPITAL_COMMUNITY): Payer: Medicare Other | Attending: Cardiology

## 2023-01-18 DIAGNOSIS — I350 Nonrheumatic aortic (valve) stenosis: Secondary | ICD-10-CM | POA: Insufficient documentation

## 2023-01-18 LAB — ECHOCARDIOGRAM COMPLETE
AR max vel: 1.14 cm2
AV Area VTI: 1.19 cm2
AV Area mean vel: 1.06 cm2
AV Mean grad: 14 mmHg
AV Peak grad: 21.7 mmHg
Ao pk vel: 2.33 m/s
Area-P 1/2: 2.56 cm2
P 1/2 time: 1238 msec
S' Lateral: 2.2 cm

## 2023-01-22 NOTE — Progress Notes (Unsigned)
No chief complaint on file.  History of Present Illness: 82 yo male with history of amyloidosis with nephrotic syndrome, HTN, moderate non-obstructive CAD by cath 2011, aortic stenosis, GERD, temporal arteritis, hyperlipidemia and carotid artery disease who is here today for follow up. Carotid artery dopplers 04/05/12 with mild bilateral disease. Stress myoview July 2016 with no ischemia. Echo May 2018 with July 2016 with normal LV function, mild aortic stenosis. He was seen in our office March 2017 by Norma Fredrickson, NP and at that time noted occasional chest pain. Imdur was added. His chest pain improved. He was diagnosed with amyloidosis in April 2018 treated with chemotherapy for 8 months, then stem cell transplant December 2018 followed by chemotherapy.He is on maintenance chemotherapy. He was admitted to [facility] July 2020 with complications from Covid 19. He developed respiratory failure and was treated with Remdesivir, steroids, Actemra and plasma. He was diagnosed with a pneumomediastinum. He was found to have a pulmonary embolism and was placed on Eliquis. Echo February 2022  and February 2023 with moderate to severe aortic stenosis. [facility]. He is on prednisone for temporal arteritis. Most recent echo in September 2024 with normal LV function and moderate low flow/low gradient aortic stenosis.    He is here today for follow up. The patient denies any chest pain, dyspnea, palpitations, lower extremity edema, orthopnea, PND, dizziness, near syncope or syncope.   Primary Care Physician: Cleatis Polka., MD  Past Medical History:  Diagnosis Date   AL amyloidosis [facility]) oncologist-- dr Candise Che   Renal and Systemic , dx 04/ 2018;  renal bx 08-05-2016 and bone marrow bx 08-29-2016;  8 month chemotherapy then stell cell transplant 12/ 2018 then chemo again;  since then been on oral chemo daily (11-07-2019 per pt has been on hold until after prostate  seed implants)   Arthritis    Atypical chest pain    CAD (coronary artery disease) cardiologist-- dr Clifton James;  also [facility] [place] dr d. Thurnell Lose   moderate nonobstrucive disease by cath 2011; Lexiscan Myoview 6/14--Normal study, no scar or ischemia, EF 62%;  11-14-2014 nuclear study low risk w/ normal perfusion, nuclear ef 67%   CKD (chronic kidney disease), stage III (HCC)    COVID    b/l PNA (7/20)- 21 day hospitalization tx with steroids, Remdesevir, Actemra, Hydroxychloroquine) --> PE on Eliquis.   Diverticulosis of colon    Dyspnea    WITH EXERTION    ED (erectile dysfunction)    Fatty liver    GERD (gastroesophageal reflux disease)    H/O stem cell transplant (HCC) 03/2017   Headache    Heart murmur    Hiatal hernia    History of 2019 novel coronavirus disease (COVID-19) 09/2018   [facility], hospital admission 10-28-2019 respiratory failure/ pneumometrostinom/ PE ;  was not intubated , treatment with remdesivir/ steroids/ actemra/ plasma   History of basal cell carcinoma (BCC)    History of colon polyps    History of MRSA infection    culture positive   History of pulmonary embolus (PE) 10/2018   with covid 19, completed treatment with eliquis   History of seizures    11-07-2019  per pt last one 1970s, unknown cause   History of vertebral compression fracture 1988   lumbar   Hx of adenomatous colonic polyps    negative in 7/08   Hyperlipidemia  Hypertension    IFG (impaired fasting glucose)    Neuropathy due to chemotherapeutic drug (HCC)    fingers tips and feet   Nocturia    On supplemental oxygen therapy    post covid 19;   11-07-2019 pt uses O2 every night 2L via [place],  and uses supplements during the day when walking around neighborhood and goes out to appointments/ store that he has to walk a distance   OSA (obstructive sleep apnea)    study in epic 03-18-2005 mild to moderate   (11-07-2019  per pt used  cpap up until when he lost alot of weight, stoped approx. 2012   Pneumonia    Prostate cancer [facility]) urologist--- dr pace/  oncology-- dr Kathrynn Running   dx 03/ 2021   Pulmonary fibrosis (HCC) 09/2018   pulmonologist-- dr Isaiah Serge;   post covid 19   Seizure (HCC)    1st in 1976; last in 1978 GTC x 2    SOB (shortness of breath) on exertion    11-07-2019  per pt walks around neighborhood for 30 minutes twice daily, he does have to stop frequently d/t sob and since weather has been hot he uses his supplemental oxygen;  stated he has a pulse ox. at home , when he is sitting down O2 sat 96 -97% on RA when up walking around the house O2 sat 93-94% on RA;  pt stated he carry's his O2 w/ him when he goes to doctor appts/store d/t distance   Wears glasses    Wears hearing aid in both ears     Past Surgical History:  Procedure Laterality Date   ARTERY BIOPSY Right 09/17/2020   Procedure: RIGHT TEMPORAL ARTERY BIOPSY;  Surgeon: Darnell Level, MD;  Location: [facility];  Service: General;  Laterality: Right;   CARDIAC CATHETERIZATION  05-21-1999  and 12-29-2000  @[facility]   dr Lajean Silvius nuclear study;  moderate nonobstructive disease involving LAD and LCx;  second done due to in Reversal research trial   CARDIAC CATHETERIZATION  07/07/2009  dr Clifton James   nonobstructive disease w/ normal LVF   CARPAL TUNNEL RELEASE Left 1994   CERVICAL FUSION  1988   C2 -- 4   CYSTOSCOPY N/A 11/13/2019   Procedure: CYSTOSCOPY FLEXIBLE;  Surgeon: Noel Christmas, MD;  Location: [name];  Service: Urology;  Laterality: N/A;  no seeds detected per Dr. Arita Miss   FOOT SURGERY Bilateral x3 last one 1980s   bunionectomy great toe--- x2 right and x1 left   KNEE ARTHROSCOPY Left 1996   NASAL SEPTUM SURGERY  x3 last one 08-09-2010 @ [facility]   PAROTIDECTOMY Right 03/09/2017   RADIOACTIVE SEED IMPLANT N/A 11/13/2019   Procedure: RADIOACTIVE SEED IMPLANT/BRACHYTHERAPY IMPLANT;  Surgeon: Noel Christmas, MD;  Location[facility];  Service: Urology;  Laterality: N/A;  ONLY NEEDS 90 MIN FOR ALL PROCEDURES   REVERSE SHOULDER ARTHROPLASTY Right 07/08/2022   Procedure: REVERSE SHOULDER ARTHROPLASTY;  Surgeon: Beverely Low, MD;  Location: [facility];  Service: Orthopedics;  Laterality: Right;   ROTATOR CUFF REPAIR Bilateral x1 rigth (unsure date);  x2 left last one 09-07-2009 @[facility] ;  right   SPACE OAR INSTILLATION N/A 11/13/2019   Procedure: SPACE OAR INSTILLATION;  Surgeon: Noel Christmas, MD;  Location: [facility];  Service: Urology;  Laterality: N/A;   TRIGGER FINGER RELEASE Bilateral x2  last one 2001 approx.   x1 finger left hand;  x2 fingers right hand   UMBILICAL  HERNIA REPAIR  01/19/2011   VASECTOMY  approx. 1968 (with anesthesia)    Current Outpatient Medications  Medication Sig Dispense Refill   albuterol (ACCUNEB) 1.25 MG/3ML nebulizer solution Take 1 ampule by nebulization every 6 (six) hours as needed for shortness of breath or wheezing.     apixaban (ELIQUIS) 5 MG TABS tablet Take 5 mg by mouth 2 (two) times daily.     Ascorbic Acid (VITAMIN C) 1000 MG tablet Take 1,000 mg by mouth daily.     atorvastatin (LIPITOR) 40 MG tablet Take 40 mg by mouth daily.     capsaicin (ZOSTRIX) 0.025 % cream Apply 1 Application topically as needed (aches and pains).     carvedilol (COREG) 6.25 MG tablet Take 3.125-6.25 mg by mouth See admin instructions. 6.25 mg in the morning, 3.125 mg in the evening     Cholecalciferol 125 MCG (5000 UT) TABS Take 5,000 Units by mouth 2 (two) times a day.     ezetimibe (ZETIA) 10 MG tablet Take 10 mg by mouth daily.     fluticasone (FLONASE) 50 MCG/ACT nasal spray Place 2 sprays into both nostrils daily. 16 g 5   furosemide (LASIX) 40 MG tablet Take 40 mg by mouth daily.      Ipratropium-Albuterol (COMBIVENT RESPIMAT) 20-100 MCG/ACT AERS respimat Inhale 1 puff into the lungs every 6 (six) hours as needed for wheezing or shortness of breath.     isosorbide  mononitrate (IMDUR) 30 MG 24 hr tablet Take 30 mg by mouth daily.     lidocaine (LIDODERM) 5 % Place 1 patch onto the skin daily as needed (pain).     Multiple Vitamin (MULTI-VITAMINS) TABS Take 1 tablet by mouth at bedtime.     mupirocin ointment (BACTROBAN) 2 % Apply 1 Application topically daily as needed (wound care).     nitroGLYCERIN (NITROSTAT) 0.4 MG SL tablet DISSOLVE ONE TABLET UNDER TONGUE EVERY 5 MINUTES AS NEEDED UP TO 3 DOSES, IF NORELIEF CALL 911 25 tablet 11   pantoprazole (PROTONIX) 40 MG tablet Take 40 mg by mouth daily.      potassium chloride SA (KLOR-CON) 20 MEQ tablet Take 20 mEq by mouth daily.     Probiotic Product (PROBIOTIC-10 PO) Take 1 capsule by mouth daily.     tamsulosin (FLOMAX) 0.4 MG CAPS capsule Take 0.4 mg by mouth 2 (two) times daily.     traMADol (ULTRAM) 50 MG tablet Take 1 tablet (50 mg total) by mouth every 6 (six) hours as needed. 40 tablet 0   triamcinolone cream (KENALOG) 0.1 % Apply 1 Application topically daily as needed (irritation).     vitamin B-12 (CYANOCOBALAMIN) 250 MCG tablet Take 500 mcg by mouth daily.     zinc gluconate 50 MG tablet Take 50 mg by mouth daily.     No current facility-administered medications for this visit.    Allergies  Allergen Reactions   Ramipril Cough    Other reaction(s): Cough (ALLERGY/intolerance)   Augmentin [Amoxicillin-Pot Clavulanate] Diarrhea    Social History   Socioeconomic History   Marital status: Married    Spouse name: Not on file   Number of children: 2   Years of education: 12   Highest education level: Not on file  Occupational History   Occupation: Retired  Tobacco Use   Smoking status: Former    Current packs/day: 0.00    Average packs/day: 3.0 packs/day for 20.0 years (60.0 ttl pk-yrs)    Types: Cigarettes    Start date:  04/25/1949    Quit date: 04/25/1969    Years since quitting: 53.7   Smokeless tobacco: Former    Types: Chew    Quit date: 11/07/1983  Vaping Use   Vaping status:  Never Used  Substance and Sexual Activity   Alcohol use: Yes    Comment: RARE   Drug use: Never   Sexual activity: Not Currently    Comment: SHIM 1. Reports erectile dysfunction.;    Other Topics Concern   Not on file  Social History Narrative   ** Merged History Encounter **   Lives at home w/ his wife   Right-handed   Caffeine: Dr Reino Kent 16 oz/week   Social Determinants of Health   Financial Resource Strain: Not on file  Food Insecurity: Patient Declined (07/08/2022)   Hunger Vital Sign    Worried About Running Out of Food in the Last Year: Patient declined    Ran Out of Food in the Last Year: Patient declined  Transportation Needs: No Transportation Needs (07/08/2022)   PRAPARE - Administrator, Civil Service (Medical): No    Lack of Transportation (Non-Medical): No  Physical Activity: Not on file  Stress: Not on file  Social Connections: Unknown (11/04/2021)   [facility]   Social Network    Social Network: Not on file  Intimate Partner Violence: Not At Risk (07/08/2022)   Humiliation, Afraid, Rape, and Kick questionnaire    Fear of Current or Ex-Partner: No    Emotionally Abused: No    Physically Abused: No    Sexually Abused: No    Family History  Problem Relation Age of Onset   Hearing loss Mother    Diabetes Father    Heart disease Father    Heart attack Father 23   Hypertension Father    Cancer Sister        1 sister had cancer but patient unsure of type   Eczema Sister    Hypertension Sister    Thyroid disease Sister    Hyperlipidemia Other    Seizures Other    Colon cancer Neg Hx    Stroke Neg Hx    Breast cancer Neg Hx    Pancreatic cancer Neg Hx    Prostate cancer Neg Hx     Review of Systems:  As stated in the HPI and otherwise negative.   There were no vitals taken for this visit.  Physical Examination: General: Well developed, well nourished, NAD  HEENT: OP clear, mucus membranes moist  SKIN:  warm, dry. No rashes. Neuro: No focal deficits  Musculoskeletal: Muscle strength 5/5 all ext  Psychiatric: Mood and affect normal  Neck: No JVD, no carotid bruits, no thyromegaly, no lymphadenopathy.  Lungs:Clear bilaterally, no wheezes, rhonci, crackles Cardiovascular: Regular rate and rhythm. *** Systolic murmur.  Abdomen:Soft. Bowel sounds present. Non-tender.  Extremities: No lower extremity edema. Pulses are 2 + in the bilateral DP/PT.  EKG:  EKG is *** ordered today. The ekg ordered today demonstrates   Recent Labs: 04/01/2022: Magnesium 2.1 06/28/2022: ALT 24; BUN 26; Creatinine, Ser 1.27; Hemoglobin 16.6; Platelets 152; Potassium 3.8; Sodium 143   Lipid Panel Followed in primary care   Wt Readings from Last 3 Encounters:  07/08/22 88 kg  07/01/22 87.4 kg  06/28/22 84.5 kg    Assessment and Plan:   1. CAD with stable angina: He is known to have moderate CAD by cath in 2011. Normal stress test at the [place] in  spring 2022. No chest pain. Continue statin, Zetia, Imdur and beta blocker. .      2. HYPERTENSION: BP is well controlled. No changes today     3. HYPERLIPIDEMIA: Lipids followed in primary care. Continue statin and Zetia.  4. Aortic stenosis: Moderate AS by echo in September 2024. Repeat echo in ***  5. Amyloidosis: He is being followed in oncology. He has had chemotherapy and a stem cell transplant. He is in remission.   6. Pulmonary embolism: he remains on Eliquis.   Labs/ tests ordered today include:  No orders of the defined types were placed in this encounter.    Disposition:   F/U with me in 6 months.   Signed, Verne Carrow, MD 01/22/2023 6:20 PM    [facility] [address] [place], [place], [place]  [address] Phone: [phone]; Fax: [phone]

## 2023-01-23 ENCOUNTER — Ambulatory Visit: Payer: Medicare Other | Attending: Cardiovascular Disease | Admitting: Cardiovascular Disease

## 2023-01-23 ENCOUNTER — Encounter: Payer: Self-pay | Admitting: Cardiovascular Disease

## 2023-01-23 VITALS — BP 102/64 | HR 88 | Ht 71.0 in | Wt 186.0 lb

## 2023-01-23 DIAGNOSIS — I10 Essential (primary) hypertension: Secondary | ICD-10-CM

## 2023-01-23 DIAGNOSIS — I350 Nonrheumatic aortic (valve) stenosis: Secondary | ICD-10-CM | POA: Diagnosis not present

## 2023-01-23 DIAGNOSIS — E7800 Pure hypercholesterolemia, unspecified: Secondary | ICD-10-CM

## 2023-01-23 DIAGNOSIS — I2510 Atherosclerotic heart disease of native coronary artery without angina pectoris: Secondary | ICD-10-CM | POA: Diagnosis not present

## 2023-01-23 NOTE — Patient Instructions (Signed)
Medication Instructions:  No changes *If you need a refill on your cardiac medications before your next appointment, please call your pharmacy*   Lab Work: none   Testing/Procedures: none   Follow-Up: At [facility], you and your health needs are our priority.  As part of our continuing mission to provide you with exceptional heart care, we have created designated Provider Care Teams.  These Care Teams include your primary Cardiologist (physician) and Advanced Practice Providers (APPs -  Physician Assistants and Nurse Practitioners) who all work together to provide you with the care you need, when you need it.  We recommend signing up for the patient portal called "MyChart".  Sign up information is provided on this After Visit Summary.  MyChart is used to connect with patients for Virtual Visits (Telemedicine).  Patients are able to view lab/test results, encounter notes, upcoming appointments, etc.  Non-urgent messages can be sent to your provider as well.   To learn more about what you can do with MyChart, go to [email or web address].    Your next appointment:   6 month(s)  Provider:   Verne Carrow, MD

## 2023-05-25 ENCOUNTER — Other Ambulatory Visit: Payer: Self-pay | Admitting: *Deleted

## 2023-05-25 DIAGNOSIS — I7143 Infrarenal abdominal aortic aneurysm, without rupture: Secondary | ICD-10-CM

## 2023-06-05 NOTE — Progress Notes (Signed)
[facility] [place]  ASSESSMENT / PLAN: 83 y.o. male with small celiac axis aneurysm (17mm); small infrarenal abdominal aortic aneurysm (30mm) without rupture. These have not changed in a year.  I counseled the patient about the benign nature of these findings.  I will plan to see him again in 1 year with repeat duplex.  CHIEF COMPLAINT: Incidental discovery of celiac artery aneurysm  HISTORY OF PRESENT ILLNESS: Alex [name]. is a 83 y.o. male referred to clinic for incidental discovery on CT scan.  Patient is undergoing preoperative workup for orthopedic surgery.  He suffered COVID-pneumonia, and has had chronic respiratory insufficiency since.  CT scan was performed to further evaluate him preoperatively.  This incidentally discovered a celiac artery aneurysm.  This was followed up by CT scan of the abdomen and pelvis, which showed redemonstration of the celiac artery aneurysm as well as an for renal abdominal aortic aneurysm.  Patient is asymptomatic in regards to both.  I spent the bulk of the visit counseling him about the natural history of these aneurysms.  06/06/23: Returns to clinic for surveillance. Doing well overall. No complaints. We reviewed his scan in detail.   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 Thurnell Lose   moderate nonobstrucive disease by cath 2011; Lexiscan Myoview 6/14--Normal study, no scar or ischemia, EF 62%;  11-14-2014 nuclear study low risk w/ normal perfusion, nuclear ef  67%   CKD (chronic kidney disease), stage III (HCC)    COVID    b/l PNA (7/20)- 21 day hospitalization tx with steroids, Remdesevir, Actemra, Hydroxychloroquine) --> PE on Eliquis.   Diverticulosis of colon    Dyspnea    WITH EXERTION    ED (erectile dysfunction)    Fatty liver    GERD (gastroesophageal reflux disease)    H/O stem cell transplant (HCC) 03/2017   Headache    Heart murmur    Hiatal hernia    History of 2019 novel coronavirus disease (COVID-19) 09/2018   [facility], hospital admission 10-28-2019 respiratory failure/ pneumometrostinom/ PE ;  was not intubated , treatment with remdesivir/ steroids/ actemra/ plasma   History of basal cell carcinoma (BCC)    History of colon polyps    History of MRSA infection    culture positive   History of pulmonary embolus (PE) 10/2018   with covid 19, completed treatment with eliquis   History of seizures    11-07-2019  per pt last one 1970s, unknown cause   History of vertebral compression fracture 1988   lumbar   Hx of adenomatous colonic polyps    negative in 7/08   Hyperlipidemia    Hypertension    IFG (impaired fasting glucose)    Neuropathy due to chemotherapeutic drug (HCC)    fingers tips and feet   Nocturia    On supplemental oxygen therapy    post covid 19;   11-07-2019 pt uses O2 every night 2L via [place],  and uses supplements during the day when walking around neighborhood and goes out to appointments/ store that he has to walk a distance   OSA (obstructive sleep apnea)    study in epic 03-18-2005 mild to moderate   (11-07-2019  per pt used cpap up until when he lost alot of weight, stoped approx. 2012   Pneumonia    Prostate cancer [facility]) urologist--- dr pace/  oncology-- dr Kathrynn Running   dx 03/ 2021   Pulmonary fibrosis (HCC) 09/2018   pulmonologist-- dr Isaiah Serge;   post covid 19   Seizure (HCC)    1st in 1976; last in 1978 GTC x 2    SOB (shortness of breath) on exertion    11-07-2019  per pt  walks around neighborhood for 30 minutes twice daily, he does have to stop frequently d/t sob and since weather has been hot he uses his supplemental oxygen;  stated he has a pulse ox. at home , when he is sitting down O2 sat 96 -97% on RA when up walking around the house O2 sat 93-94% on RA;  pt stated he carry's his O2 w/ him when he goes to doctor appts/store d/t distance   Wears glasses    Wears hearing aid in both ears     Past Surgical History:  Procedure Laterality Date   ARTERY BIOPSY Right 09/17/2020   Procedure: RIGHT TEMPORAL ARTERY BIOPSY;  Surgeon: Darnell Level, MD;  Location: [facility];  Service: General;  Laterality: Right;   CARDIAC CATHETERIZATION  05-21-1999  and 12-29-2000  @[facility]   dr Lajean Silvius nuclear study;  moderate nonobstructive disease involving LAD and LCx;  second done due to in Reversal research trial   CARDIAC CATHETERIZATION  07/07/2009  dr Clifton James   nonobstructive disease w/ normal LVF   CARPAL TUNNEL RELEASE Left 1994   CERVICAL FUSION  1988   C2 -- 4   CYSTOSCOPY N/A 11/13/2019   Procedure: CYSTOSCOPY FLEXIBLE;  Surgeon: Noel Christmas, MD;  Location: [facility];  Service: Urology;  Laterality: N/A;  no seeds detected per Dr. Arita Miss   FOOT SURGERY Bilateral x3 last one 1980s   bunionectomy great toe--- x2 right and x1 left   KNEE ARTHROSCOPY Left 1996   NASAL SEPTUM SURGERY  x3 last one 08-09-2010 @ [facility]   PAROTIDECTOMY Right 03/09/2017   RADIOACTIVE SEED IMPLANT N/A 11/13/2019   Procedure: RADIOACTIVE SEED IMPLANT/BRACHYTHERAPY IMPLANT;  Surgeon: Noel Christmas, MD;  Location: [facility];  Service: Urology;  Laterality: N/A;  ONLY NEEDS 90 MIN FOR ALL PROCEDURES   REVERSE SHOULDER ARTHROPLASTY Right 07/08/2022   Procedure: REVERSE SHOULDER ARTHROPLASTY;  Surgeon: Beverely Low, MD;  Location: [facility];  Service: Orthopedics;  Laterality: Right;   ROTATOR CUFF REPAIR Bilateral x1 rigth (unsure date);  x2 left last one  09-07-2009 @[facility] ;  right   SPACE OAR INSTILLATION N/A 11/13/2019   Procedure: SPACE OAR INSTILLATION;  Surgeon: Noel Christmas, MD;  Location[facility];  Service: Urology;  Laterality: N/A;   TRIGGER FINGER RELEASE Bilateral x2  last one 2001 approx.   x1 finger left hand;  x2 fingers right hand   UMBILICAL HERNIA REPAIR  01/19/2011   VASECTOMY  approx. 78 (with anesthesia)    Family History  Problem Relation Age of Onset   Hearing loss Mother    Diabetes Father    Heart disease Father    Heart attack Father 58   Hypertension Father  Cancer Sister        1 sister had cancer but patient unsure of type   Eczema Sister    Hypertension Sister    Thyroid disease Sister    Hyperlipidemia Other    Seizures Other    Colon cancer Neg Hx    Stroke Neg Hx    Breast cancer Neg Hx    Pancreatic cancer Neg Hx    Prostate cancer Neg Hx     Social History   Socioeconomic History   Marital status: Married    Spouse name: Not on file   Number of children: 2   Years of education: 12   Highest education level: Not on file  Occupational History   Occupation: Retired  Tobacco Use   Smoking status: Former    Current packs/day: 0.00    Average packs/day: 3.0 packs/day for 20.0 years (60.0 ttl pk-yrs)    Types: Cigarettes    Start date: 04/25/1949    Quit date: 04/25/1969    Years since quitting: 54.1   Smokeless tobacco: Former    Types: Chew    Quit date: 11/07/1983  Vaping Use   Vaping status: Never Used  Substance and Sexual Activity   Alcohol use: Yes    Comment: RARE   Drug use: Never   Sexual activity: Not Currently    Comment: SHIM 1. Reports erectile dysfunction.;    Other Topics Concern   Not on file  Social History Narrative   ** Merged History Encounter **   Lives at home w/ his wife   Right-handed   Caffeine: Dr Reino Kent 16 oz/week   Social Drivers of Health   Financial Resource Strain: Not on file  Food Insecurity: Patient Declined (07/08/2022)    Hunger Vital Sign    Worried About Running Out of Food in the Last Year: Patient declined    Ran Out of Food in the Last Year: Patient declined  Transportation Needs: No Transportation Needs (07/08/2022)   PRAPARE - Administrator, Civil Service (Medical): No    Lack of Transportation (Non-Medical): No  Physical Activity: Not on file  Stress: Not on file  Social Connections: Unknown (11/04/2021)   [facility]   Social Network    Social Network: Not on file  Intimate Partner Violence: Not At Risk (07/08/2022)   Humiliation, Afraid, Rape, and Kick questionnaire    Fear of Current or Ex-Partner: No    Emotionally Abused: No    Physically Abused: No    Sexually Abused: No    Allergies  Allergen Reactions   Ramipril Cough    Other reaction(s): Cough (ALLERGY/intolerance)   Augmentin [Amoxicillin-Pot Clavulanate] Diarrhea    Current Outpatient Medications  Medication Sig Dispense Refill   albuterol (ACCUNEB) 1.25 MG/3ML nebulizer solution Take 1 ampule by nebulization every 6 (six) hours as needed for shortness of breath or wheezing.     apixaban (ELIQUIS) 5 MG TABS tablet Take 5 mg by mouth 2 (two) times daily.     Ascorbic Acid (VITAMIN C) 1000 MG tablet Take 1,000 mg by mouth daily.     atorvastatin (LIPITOR) 40 MG tablet Take 40 mg by mouth daily.     capsaicin (ZOSTRIX) 0.025 % cream Apply 1 Application topically as needed (aches and pains).     carvedilol (COREG) 6.25 MG tablet Take 3.125-6.25 mg by mouth See admin instructions. 6.25 mg in the morning, 3.125 mg in the evening     Cholecalciferol  125 MCG (5000 UT) TABS Take 5,000 Units by mouth 2 (two) times a day.     ezetimibe (ZETIA) 10 MG tablet Take 10 mg by mouth daily.     fluticasone (FLONASE) 50 MCG/ACT nasal spray Place 2 sprays into both nostrils daily. 16 g 5   furosemide (LASIX) 40 MG tablet Take 40 mg by mouth daily.      Ipratropium-Albuterol (COMBIVENT RESPIMAT) 20-100  MCG/ACT AERS respimat Inhale 1 puff into the lungs every 6 (six) hours as needed for wheezing or shortness of breath.     isosorbide mononitrate (IMDUR) 30 MG 24 hr tablet Take 30 mg by mouth daily.     lidocaine (LIDODERM) 5 % Place 1 patch onto the skin daily as needed (pain).     Multiple Vitamin (MULTI-VITAMINS) TABS Take 1 tablet by mouth at bedtime.     mupirocin ointment (BACTROBAN) 2 % Apply 1 Application topically daily as needed (wound care).     nitroGLYCERIN (NITROSTAT) 0.4 MG SL tablet DISSOLVE ONE TABLET UNDER TONGUE EVERY 5 MINUTES AS NEEDED UP TO 3 DOSES, IF NORELIEF CALL 911 25 tablet 11   pantoprazole (PROTONIX) 40 MG tablet Take 40 mg by mouth daily.      potassium chloride SA (KLOR-CON) 20 MEQ tablet Take 20 mEq by mouth daily.     Probiotic Product (PROBIOTIC-10 PO) Take 1 capsule by mouth daily.     tamsulosin (FLOMAX) 0.4 MG CAPS capsule Take 0.4 mg by mouth 2 (two) times daily.     traMADol (ULTRAM) 50 MG tablet Take 1 tablet (50 mg total) by mouth every 6 (six) hours as needed. 40 tablet 0   triamcinolone cream (KENALOG) 0.1 % Apply 1 Application topically daily as needed (irritation).     vitamin B-12 (CYANOCOBALAMIN) 250 MCG tablet Take 500 mcg by mouth daily.     zinc gluconate 50 MG tablet Take 50 mg by mouth daily.     No current facility-administered medications for this visit.    PHYSICAL EXAM There were no vitals filed for this visit.  Well-appearing gentleman in no acute distress Regular rate and rhythm Unlabored breathing Abdomen benign.  PERTINENT LABORATORY AND RADIOLOGIC DATA  Most recent CBC    Latest Ref Rng & Units 06/28/2022   11:32 AM 04/01/2022   12:25 PM 05/27/2021   11:36 AM  CBC  WBC 4.0 - 10.5 K/uL 7.0  10.3  9.8   Hemoglobin 13.0 - 17.0 g/dL 16.1  09.6  04.5   Hematocrit 39.0 - 52.0 % 49.3  48.7  45.7   Platelets 150 - 400 K/uL 152  148  127      Most recent CMP    Latest Ref Rng & Units 06/28/2022   11:32 AM 04/01/2022   12:25 PM  05/27/2021   11:36 AM  CMP  Glucose 70 - 99 mg/dL 409  811  914   BUN 8 - 23 mg/dL 26  25  29    Creatinine 0.61 - 1.24 mg/dL 7.82  9.56  2.13   Sodium 135 - 145 mmol/L 143  140  141   Potassium 3.5 - 5.1 mmol/L 3.8  3.6  3.8   Chloride 98 - 111 mmol/L 107  108  106   CO2 22 - 32 mmol/L 25  22  29    Calcium 8.9 - 10.3 mg/dL 9.3  9.1  9.3   Total Protein 6.5 - 8.1 g/dL 7.1  6.2  6.5   Total Bilirubin 0.3 - 1.2  mg/dL 1.8  1.1  1.5   Alkaline Phos 38 - 126 U/L 69  66  60   AST 15 - 41 U/L 28  49  37   ALT 0 - 44 U/L 24  47  51     Renal function CrCl cannot be calculated (Patient's most recent lab result is older than the maximum 21 days allowed.).  Hgb A1c MFr Bld (%)  Date Value  06/28/2022 5.5    LDL Cholesterol  Date Value Ref Range Status  07/06/2009  0 - 99 mg/dL Final   79        Total Cholesterol/HDL:CHD Risk Coronary Heart Disease Risk Table                     Men   Women  1/2 Average Risk   3.4   3.3  Average Risk       5.0   4.4  2 X Average Risk   9.6   7.1  3 X Average Risk  23.4   11.0        Use the calculated Patient Ratio above and the CHD Risk Table to determine the patient's CHD Risk.        ATP III CLASSIFICATION (LDL):  <100     mg/dL   Optimal  161-096  mg/dL   Near or Above                    Optimal  130-159  mg/dL   Borderline  045-409  mg/dL   High  >811     mg/dL   Very High    CT scan of the abdomen and pelvis personally reviewed in detail.  There is a small aneurysm in the celiac artery about its bifurcation.  There is a small infrarenal abdominal aortic aneurysm.  Duplex 06/06/23 shows similar sizes of CA aneurysm and AAA.  Rande Brunt. Lenell Antu, MD FACS [facility] Phone Number: [phone] 06/05/2023 5:04 PM   Total time spent on preparing this encounter including chart review, data review, collecting history, examining the patient, coordinating care for this established patient, 20 minutes.    Portions of this report may have been transcribed using voice recognition software.  Every effort has been made to ensure accuracy; however, inadvertent computerized transcription errors may still be present.

## 2023-06-06 ENCOUNTER — Ambulatory Visit (HOSPITAL_COMMUNITY)
Admission: RE | Admit: 2023-06-06 | Discharge: 2023-06-06 | Disposition: A | Discharge status: Home / Self Care | Payer: Medicare PPO | Source: Ambulatory Visit | Attending: Vascular Surgery | Admitting: Vascular Surgery

## 2023-06-06 ENCOUNTER — Encounter: Payer: Self-pay | Admitting: Vascular Surgery

## 2023-06-06 ENCOUNTER — Ambulatory Visit (INDEPENDENT_AMBULATORY_CARE_PROVIDER_SITE_OTHER): Payer: Medicare PPO | Admitting: Vascular Surgery

## 2023-06-06 VITALS — BP 137/87 | HR 69 | Temp 97.7°F | Resp 20 | Ht 71.0 in | Wt 185.0 lb

## 2023-06-06 DIAGNOSIS — I728 Aneurysm of other specified arteries: Secondary | ICD-10-CM | POA: Diagnosis not present

## 2023-06-06 DIAGNOSIS — I7143 Infrarenal abdominal aortic aneurysm, without rupture: Secondary | ICD-10-CM | POA: Insufficient documentation

## 2023-07-10 ENCOUNTER — Ambulatory Visit: Payer: Medicare Other | Attending: Cardiovascular Disease | Admitting: Cardiovascular Disease

## 2023-07-10 ENCOUNTER — Encounter: Payer: Self-pay | Admitting: Cardiovascular Disease

## 2023-07-10 VITALS — BP 148/88 | HR 67 | Ht 71.0 in | Wt 190.0 lb

## 2023-07-10 DIAGNOSIS — I10 Essential (primary) hypertension: Secondary | ICD-10-CM | POA: Diagnosis not present

## 2023-07-10 DIAGNOSIS — E7800 Pure hypercholesterolemia, unspecified: Secondary | ICD-10-CM | POA: Diagnosis not present

## 2023-07-10 DIAGNOSIS — I2510 Atherosclerotic heart disease of native coronary artery without angina pectoris: Secondary | ICD-10-CM

## 2023-07-10 DIAGNOSIS — I350 Nonrheumatic aortic (valve) stenosis: Secondary | ICD-10-CM

## 2023-07-10 DIAGNOSIS — E8581 Light chain (AL) amyloidosis: Secondary | ICD-10-CM

## 2023-07-10 NOTE — Patient Instructions (Signed)
 Medication Instructions:  Your physician recommends that you continue on your current medications as directed. Please refer to the Current Medication list given to you today.  *If you need a refill on your cardiac medications before your next appointment, please call your pharmacy*  Lab Work: If you have labs (blood work) drawn today and your tests are completely normal, you will receive your results only by: MyChart Message (if you have MyChart) OR A paper copy in the mail If you have any lab test that is abnormal or we need to change your treatment, we will call you to review the results.  Testing/Procedures: Your physician has requested that you have an echocardiogram in September. Echocardiography is a painless test that uses sound waves to create images of your heart. It provides your doctor with information about the size and shape of your heart and how well your heart's chambers and valves are working. This procedure takes approximately one hour. There are no restrictions for this procedure. Please do NOT wear cologne, perfume, aftershave, or lotions (deodorant is allowed). Please arrive 15 minutes prior to your appointment time.  Please note: We ask at that you not bring children with you during ultrasound (echo/ vascular) testing. Due to room size and safety concerns, children are not allowed in the ultrasound rooms during exams. Our front office staff cannot provide observation of children in our lobby area while testing is being conducted. An adult accompanying a patient to their appointment will only be allowed in the ultrasound room at the discretion of the ultrasound technician under special circumstances. We apologize for any inconvenience. Follow-Up: At [facility], you and your health needs are our priority.  As part of our continuing mission to provide you with exceptional heart care, we have created designated Provider Care Teams.  These Care Teams include your primary  Cardiologist (physician) and Advanced Practice Providers (APPs -  Physician Assistants and Nurse Practitioners) who all work together to provide you with the care you need, when you need it.  We recommend signing up for the patient portal called "MyChart".  Sign up information is provided on this After Visit Summary.  MyChart is used to connect with patients for Virtual Visits (Telemedicine).  Patients are able to view lab/test results, encounter notes, upcoming appointments, etc.  Non-urgent messages can be sent to your provider as well.   To learn more about what you can do with MyChart, go to [email or web address].    Your next appointment:   6 month(s)  Provider:   Verne Carrow, MD     Other Instructions       1st Floor: - Lobby - Registration  - Pharmacy  - Lab - Cafe  2nd Floor: - PV Lab - Diagnostic Testing (echo, CT, nuclear med)  3rd Floor: - Vacant  4th Floor: - TCTS (cardiothoracic surgery) - AFib Clinic - Structural Heart Clinic - Vascular Surgery  - Vascular Ultrasound  5th Floor: - HeartCare Cardiology (general and EP) - Clinical Pharmacy for coumadin, hypertension, lipid, weight-loss medications, and med management appointments    Valet parking services will be available as well.

## 2023-07-10 NOTE — Progress Notes (Signed)
 Chief Complaint  Patient presents with   Follow-up    CAD, aortic stenosis   History of Present Illness: 83 yo male with history of amyloidosis with nephrotic syndrome, HTN, moderate non-obstructive CAD by cath 2011, aortic stenosis, GERD, temporal arteritis, hyperlipidemia and carotid artery disease who is here today for follow up. Cardiac cath in 2011 with non-obstructive CAD. [facility]. He was diagnosed with amyloidosis in April 2018 treated with chemotherapy for 8 months, then stem cell transplant December 2018 followed by chemotherapy. He was admitted to [facility] July 2020 with complications from Covid 19. He developed respiratory failure and was treated with Remdesivir, steroids, Actemra and plasma. He was diagnosed with a pneumomediastinum. He was found to have a pulmonary embolism and was placed on Eliquis. Echo February 2022  and February 2023 with moderate to severe aortic stenosis. Echo September 2024 with normal LV function and moderate low flow/low gradient aortic stenosis.  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 Lenell Antu.   He is here today for follow up. The patient denies any chest pain, palpitations, lower extremity edema, orthopnea, PND, dizziness, near syncope or syncope. Baseline dyspnea with mild chest pressure with exertion. Unchanged over the past year.   Primary Care Physician: Cleatis Polka., MD  Past Medical History:  Diagnosis Date   AL amyloidosis [facility]) oncologist-- dr Candise Che   Renal and Systemic , dx 04/ 2018;  renal bx 08-05-2016 and bone marrow bx 08-29-2016;  8 month chemotherapy then stell cell transplant 12/ 2018 then chemo again;  since then been on oral chemo daily (11-07-2019 per pt has been on hold until after prostate seed implants)   Arthritis    Atypical chest pain    CAD (coronary artery disease) cardiologist-- dr Clifton James;  [facility] [place] dr d.  Thurnell Lose   moderate nonobstrucive disease by cath 2011; Lexiscan Myoview 6/14--Normal study, no scar or ischemia, EF 62%;  11-14-2014 nuclear study low risk w/ normal perfusion, nuclear ef 67%   CKD (chronic kidney disease), stage III (HCC)    COVID    b/l PNA (7/20)- 21 day hospitalization tx with steroids, Remdesevir, Actemra, Hydroxychloroquine) --> PE on Eliquis.   Diverticulosis of colon    Dyspnea    WITH EXERTION    ED (erectile dysfunction)    Fatty liver    GERD (gastroesophageal reflux disease)    H/O stem cell transplant (HCC) 03/2017   Headache    Heart murmur    Hiatal hernia    History of 2019 novel coronavirus disease (COVID-19) 09/2018   [facility], hospital admission 10-28-2019 respiratory failure/ pneumometrostinom/ PE ;  was not intubated , treatment with remdesivir/ steroids/ actemra/ plasma   History of basal cell carcinoma (BCC)    History of colon polyps    History of MRSA infection    culture positive   History of pulmonary embolus (PE) 10/2018   with covid 19, completed treatment with eliquis   History of seizures    11-07-2019  per pt last one 1970s, unknown cause   History of vertebral compression fracture 1988   lumbar   Hx of adenomatous colonic polyps    negative in 7/08   Hyperlipidemia    Hypertension    IFG (impaired fasting glucose)    Neuropathy due to chemotherapeutic drug (HCC)    fingers tips and feet  Nocturia    On supplemental oxygen therapy    post covid 19;   11-07-2019 pt uses O2 every night 2L via [place],  and uses supplements during the day when walking around neighborhood and goes out to appointments/ store that he has to walk a distance   OSA (obstructive sleep apnea)    study in epic 03-18-2005 mild to moderate   (11-07-2019  per pt used cpap up until when he lost alot of weight, stoped approx. 2012   Pneumonia    Prostate cancer [facility]) urologist--- dr pace/  oncology-- dr Kathrynn Running   dx 03/ 2021   Pulmonary  fibrosis (HCC) 09/2018   pulmonologist-- dr Isaiah Serge;   post covid 19   Seizure (HCC)    1st in 1976; last in 1978 GTC x 2    SOB (shortness of breath) on exertion    11-07-2019  per pt walks around neighborhood for 30 minutes twice daily, he does have to stop frequently d/t sob and since weather has been hot he uses his supplemental oxygen;  stated he has a pulse ox. at home , when he is sitting down O2 sat 96 -97% on RA when up walking around the house O2 sat 93-94% on RA;  pt stated he carry's his O2 w/ him when he goes to doctor appts/store d/t distance   Wears glasses    Wears hearing aid in both ears     Past Surgical History:  Procedure Laterality Date   ARTERY BIOPSY Right 09/17/2020   Procedure: RIGHT TEMPORAL ARTERY BIOPSY;  Surgeon: Darnell Level, MD;  Location: [facility];  Service: General;  Laterality: Right;   CARDIAC CATHETERIZATION  05-21-1999  and 12-29-2000  @[facility]   dr Lajean Silvius nuclear study;  moderate nonobstructive disease involving LAD and LCx;  second done due to in Reversal research trial   CARDIAC CATHETERIZATION  07/07/2009  dr Clifton James   nonobstructive disease w/ normal LVF   CARPAL TUNNEL RELEASE Left 1994   CERVICAL FUSION  1988   C2 -- 4   CYSTOSCOPY N/A 11/13/2019   Procedure: CYSTOSCOPY FLEXIBLE;  Surgeon: Noel Christmas, MD;  Location: [facility];  Service: Urology;  Laterality: N/A;  no seeds detected per Dr. Arita Miss   FOOT SURGERY Bilateral x3 last one 1980s   bunionectomy great toe--- x2 right and x1 left   KNEE ARTHROSCOPY Left 1996   NASAL SEPTUM SURGERY  x3 last one 08-09-2010 @ [facility]   PAROTIDECTOMY Right 03/09/2017   RADIOACTIVE SEED IMPLANT N/A 11/13/2019   Procedure: RADIOACTIVE SEED IMPLANT/BRACHYTHERAPY IMPLANT;  Surgeon: Noel Christmas, MD;  Location: [facility];  Service: Urology;  Laterality: N/A;  ONLY NEEDS 90 MIN FOR ALL PROCEDURES   REVERSE SHOULDER ARTHROPLASTY Right 07/08/2022   Procedure: REVERSE SHOULDER  ARTHROPLASTY;  Surgeon: Beverely Low, MD;  Location: [facility];  Service: Orthopedics;  Laterality: Right;   ROTATOR CUFF REPAIR Bilateral x1 rigth (unsure date);  x2 left last one 09-07-2009 @[facility] ;  right   SPACE OAR INSTILLATION N/A 11/13/2019   Procedure: SPACE OAR INSTILLATION;  Surgeon: Noel Christmas, MD;  Location: [facility];  Service: Urology;  Laterality: N/A;   TRIGGER FINGER RELEASE Bilateral x2  last one 2001 approx.   x1 finger left hand;  x2 fingers right hand   UMBILICAL HERNIA REPAIR  01/19/2011   VASECTOMY  approx. 1968 (with anesthesia)    Current Outpatient Medications  Medication Sig Dispense Refill   albuterol (  ACCUNEB) 1.25 MG/3ML nebulizer solution Take 1 ampule by nebulization every 6 (six) hours as needed for shortness of breath or wheezing.     apixaban (ELIQUIS) 5 MG TABS tablet Take 5 mg by mouth 2 (two) times daily.     Ascorbic Acid (VITAMIN C) 1000 MG tablet Take 1,000 mg by mouth daily.     atorvastatin (LIPITOR) 40 MG tablet Take 40 mg by mouth daily.     capsaicin (ZOSTRIX) 0.025 % cream Apply 1 Application topically as needed (aches and pains).     carvedilol (COREG) 6.25 MG tablet Take 3.125-6.25 mg by mouth See admin instructions. 6.25 mg in the morning, 3.125 mg in the evening     Cholecalciferol 125 MCG (5000 UT) TABS Take 5,000 Units by mouth 2 (two) times a day.     diclofenac Sodium (VOLTAREN) 1 % GEL Apply topically.     ezetimibe (ZETIA) 10 MG tablet Take 10 mg by mouth daily.     fluticasone (FLONASE) 50 MCG/ACT nasal spray Place 2 sprays into both nostrils daily. 16 g 5   furosemide (LASIX) 40 MG tablet Take 40 mg by mouth daily.      Ipratropium-Albuterol (COMBIVENT RESPIMAT) 20-100 MCG/ACT AERS respimat Inhale 1 puff into the lungs every 6 (six) hours as needed for wheezing or shortness of breath.     isosorbide mononitrate (IMDUR) 30 MG 24 hr tablet Take 30 mg by mouth daily.     lidocaine (LIDODERM) 5 % Place 1 patch onto the  skin daily as needed (pain).     Multiple Vitamin (MULTI-VITAMINS) TABS Take 1 tablet by mouth at bedtime.     mupirocin ointment (BACTROBAN) 2 % Apply 1 Application topically daily as needed (wound care).     nitroGLYCERIN (NITROSTAT) 0.4 MG SL tablet DISSOLVE ONE TABLET UNDER TONGUE EVERY 5 MINUTES AS NEEDED UP TO 3 DOSES, IF NORELIEF CALL 911 25 tablet 11   pantoprazole (PROTONIX) 40 MG tablet Take 40 mg by mouth daily.      potassium chloride SA (KLOR-CON) 20 MEQ tablet Take 20 mEq by mouth daily.     Probiotic Product (PROBIOTIC-10 PO) Take 1 capsule by mouth daily.     tamsulosin (FLOMAX) 0.4 MG CAPS capsule Take 0.4 mg by mouth 2 (two) times daily.     triamcinolone cream (KENALOG) 0.1 % Apply 1 Application topically daily as needed (irritation).     vitamin B-12 (CYANOCOBALAMIN) 250 MCG tablet Take 500 mcg by mouth daily.     zinc gluconate 50 MG tablet Take 50 mg by mouth daily.     No current facility-administered medications for this visit.    Allergies  Allergen Reactions   Ramipril Cough    Other reaction(s): Cough (ALLERGY/intolerance)   Augmentin [Amoxicillin-Pot Clavulanate] Diarrhea    Social History   Socioeconomic History   Marital status: Married    Spouse name: Not on file   Number of children: 2   Years of education: 12   Highest education level: Not on file  Occupational History   Occupation: Retired  Tobacco Use   Smoking status: Former    Current packs/day: 0.00    Average packs/day: 3.0 packs/day for 20.0 years (60.0 ttl pk-yrs)    Types: Cigarettes    Start date: 04/25/1949    Quit date: 04/25/1969    Years since quitting: 54.2   Smokeless tobacco: Former    Types: Chew    Quit date: 11/07/1983  Vaping Use   Vaping status: Never  Used  Substance and Sexual Activity   Alcohol use: Yes    Comment: RARE   Drug use: Never   Sexual activity: Not Currently    Comment: SHIM 1. Reports erectile dysfunction.;    Other Topics Concern   Not on file   Social History Narrative   ** Merged History Encounter **   Lives at home w/ his wife   Right-handed   Caffeine: Dr Reino Kent 16 oz/week   Social Drivers of Health   Financial Resource Strain: Not on file  Food Insecurity: Patient Declined (07/08/2022)   Hunger Vital Sign    Worried About Running Out of Food in the Last Year: Patient declined    Ran Out of Food in the Last Year: Patient declined  Transportation Needs: No Transportation Needs (07/08/2022)   PRAPARE - Administrator, Civil Service (Medical): No    Lack of Transportation (Non-Medical): No  Physical Activity: Not on file  Stress: Not on file  Social Connections: Unknown (11/04/2021)   Received from [facility]   Social Network    Social Network: Not on file  Intimate Partner Violence: Not At Risk (07/08/2022)   Humiliation, Afraid, Rape, and Kick questionnaire    Fear of Current or Ex-Partner: No    Emotionally Abused: No    Physically Abused: No    Sexually Abused: No    Family History  Problem Relation Age of Onset   Hearing loss Mother    Diabetes Father    Heart disease Father    Heart attack Father 12   Hypertension Father    Cancer Sister        1 sister had cancer but patient unsure of type   Eczema Sister    Hypertension Sister    Thyroid disease Sister    Hyperlipidemia Other    Seizures Other    Colon cancer Neg Hx    Stroke Neg Hx    Breast cancer Neg Hx    Pancreatic cancer Neg Hx    Prostate cancer Neg Hx     Review of Systems:  As stated in the HPI and otherwise negative.   BP (!) 148/88   Pulse 67   Ht 5\' 11"  (1.803 m)   Wt 86.2 kg   SpO2 97%   BMI 26.50 kg/m   Physical Examination:  General: Well developed, well nourished, NAD  HEENT: OP clear, mucus membranes moist  SKIN: warm, dry. No rashes. Neuro: No focal deficits  Musculoskeletal: Muscle strength 5/5 all ext  Psychiatric: Mood and affect normal  Neck: No JVD, no carotid bruits, no  thyromegaly, no lymphadenopathy.  Lungs:Clear bilaterally, no wheezes, rhonci, crackles Cardiovascular: Regular rate and rhythm. Systolic murmur.  Abdomen:Soft. Bowel sounds present. Non-tender.  Extremities: No lower extremity edema.  EKG:  EKG is ordered today. The ekg ordered today demonstrates  EKG Interpretation Date/Time:  Monday July 10 2023 10:31:26 EDT Ventricular Rate:  65 PR Interval:  204 QRS Duration:  104 QT Interval:  412 QTC Calculation: 428 R Axis:   -53  Text Interpretation: Normal sinus rhythm Left anterior fascicular block Confirmed by Verne Carrow [phone]) on 07/10/2023 10:35:34 AM    Recent Labs: No results found for requested labs within last 365 days.   Lipid Panel Followed in primary care   Wt Readings from Last 3 Encounters:  07/10/23 86.2 kg  06/06/23 83.9 kg  01/23/23 84.4 kg    Assessment and Plan:   1. CAD with  stable angina: He is known to have moderate CAD by cath in 2011. Normal stress test at the [place] in spring 2022. No chest pain suggestive of angina. Will continue beta blocker, statin, Zetia and Imdur.       2. HYPERTENSION: BP is controlled at home. Continue Coreg and Imdur.      3. HYPERLIPIDEMIA: Lipids followed in primary care. LDL 63 in March 2024. Continue Lipitor and Imdur.   4. Aortic stenosis: Moderate low flow/low gradient AS by echo in September 2024. Will repeat echo in September 2025.    5. Amyloidosis: He is being followed in oncology. He has had chemotherapy and a stem cell transplant. He is in remission.   6. Pulmonary fibrosis/pulmonary embolism: His dyspnea is felt to be due to his underlying lung disease. He remains on Eliquis.   Labs/ tests ordered today include:   Orders Placed This Encounter  Procedures   EKG 12-Lead   ECHOCARDIOGRAM COMPLETE   Disposition:   F/U with me in 6 months.   Signed, Verne Carrow, MD 07/10/2023 11:06 AM    [facility] [address] [place],  [place], [place]  [address] Phone: [phone]; Fax: [phone]

## 2023-09-04 NOTE — Progress Notes (Unsigned)
 se     Alex Canard, [name] [address] [place], [place]  [address] Phone: [phone]   Primary Care Physician: [name]., MD  Primary Gastroenterologist:  Alex Canard, [name] / [name], MD   Chief Complaint:  Dysphagia, GERD      HPI:   Alex [name]. is a 83 y.o. male presents for evaluation of dysphagia.  Referred by his PCP [name].  He takes Pantoprazole  40mg  daily for GERD.  He has not had a Barium Swallow Test or EGD.  Current Symptoms: Patient has had intermittent episodes of difficulty swallowing foods for 1 year.  Worse when he is eating dry foods.  He has choked on oatmeal and Cheerios.  He has to drink of a lot of water  to get food to go down.  He chews well and slowly.  He has dentures.  He reports dry mouth with decreased saliva.  Has occasional episode of acid reflux.  Takes OTC Gas-X and Mylanta for breakthrough GERD symptoms.  Admits to belching.  Denies abdominal pain or weight loss.  PMH:  Atrial Fibrillation, Temporal arteritis, AL amyloidosis with nephrotic syndrome, hypertension, coronary artery disease, aortic stenosis, hyperlipidemia, carotid artery disease, lung disease requiring home oxygen  use, Hx COVID Pneumonia in 2020, CKD 3, Hx Prostate Cancer.  Currently on Eliquis . 12/2022 Echo LVEF 50-55%.  11/2020 Virtual Colonoscopy (ordered by [name]): Diverticulosis, Hepatic Steatosis, CAD.  NO Polyps.  No repeat.  Current Outpatient Medications  Medication Sig Dispense Refill   albuterol  (ACCUNEB ) 1.25 MG/3ML nebulizer solution Take 1 ampule by nebulization every 6 (six) hours as needed for shortness of breath or wheezing.     apixaban  (ELIQUIS ) 5 MG TABS tablet Take 5 mg by mouth 2 (two) times daily.     Ascorbic Acid  (VITAMIN C ) 1000 MG tablet Take 1,000 mg by mouth daily.     atorvastatin  (LIPITOR) 40 MG tablet Take 40 mg by mouth daily.     capsaicin (ZOSTRIX) 0.025 % cream Apply 1 Application topically as needed (aches and pains).      carvedilol  (COREG ) 6.25 MG tablet Take 3.125-6.25 mg by mouth See admin instructions. 6.25 mg in the morning, 3.125 mg in the evening     Cholecalciferol  125 MCG (5000 UT) TABS Take 5,000 Units by mouth 2 (two) times a day.     diclofenac Sodium (VOLTAREN) 1 % GEL Apply topically.     ezetimibe  (ZETIA ) 10 MG tablet Take 10 mg by mouth daily.     fluticasone  (FLONASE ) 50 MCG/ACT nasal spray Place 2 sprays into both nostrils daily. 16 g 5   furosemide  (LASIX ) 40 MG tablet Take 40 mg by mouth daily.      Ipratropium-Albuterol  (COMBIVENT RESPIMAT) 20-100 MCG/ACT AERS respimat Inhale 1 puff into the lungs every 6 (six) hours as needed for wheezing or shortness of breath.     isosorbide  mononitrate (IMDUR ) 30 MG 24 hr tablet Take 30 mg by mouth daily.     lidocaine  (LIDODERM ) 5 % Place 1 patch onto the skin daily as needed (pain).     Multiple Vitamin (MULTI-VITAMINS) TABS Take 1 tablet by mouth at bedtime.     mupirocin  ointment (BACTROBAN ) 2 % Apply 1 Application topically daily as needed (wound care).     nitroGLYCERIN  (NITROSTAT ) 0.4 MG SL tablet DISSOLVE ONE TABLET UNDER TONGUE EVERY 5 MINUTES AS NEEDED UP TO 3 DOSES, IF NORELIEF CALL 911 25 tablet 11   pantoprazole  (PROTONIX ) 40 MG tablet  Take 40 mg by mouth daily.      potassium chloride  SA (KLOR-CON ) 20 MEQ tablet Take 20 mEq by mouth daily.     Probiotic Product (PROBIOTIC-10 PO) Take 1 capsule by mouth daily.     tamsulosin  (FLOMAX ) 0.4 MG CAPS capsule Take 0.4 mg by mouth 2 (two) times daily.     triamcinolone  cream (KENALOG ) 0.1 % Apply 1 Application topically daily as needed (irritation).     vitamin B-12 (CYANOCOBALAMIN) 250 MCG tablet Take 500 mcg by mouth daily.     zinc gluconate 50 MG tablet Take 50 mg by mouth daily.     No current facility-administered medications for this visit.    Allergies as of 09/05/2023 - Review Complete 09/05/2023  Allergen Reaction Noted   Ramipril Cough 07/06/2017   Augmentin [amoxicillin-pot  clavulanate] Diarrhea 03/24/2009    Past Medical History:  Diagnosis Date   AL amyloidosis [facility]) oncologist-- dr Salomon Cree   Renal and Systemic , dx 04/ 2018;  renal bx 08-05-2016 and bone marrow bx 08-29-2016;  8 month chemotherapy then stell cell transplant 12/ 2018 then chemo again;  since then been on oral chemo daily (11-07-2019 per pt has been on hold until after prostate seed implants)   Arthritis    Atypical chest pain    CAD (coronary artery disease) cardiologist-- dr Abel Hoe;  [facility] [place] dr d. Romayne Clubs   moderate nonobstrucive disease by cath 2011; Lexiscan  Myoview  6/14--Normal study, no scar or ischemia, EF 62%;  11-14-2014 nuclear study low risk w/ normal perfusion, nuclear ef 67%   CKD (chronic kidney disease), stage III (HCC)    COVID    b/l PNA (7/20)- 21 day hospitalization tx with steroids, Remdesevir, Actemra , Hydroxychloroquine ) --> PE on Eliquis .   Diverticulosis of colon    Dyspnea    WITH EXERTION    ED (erectile dysfunction)    Fatty liver    GERD (gastroesophageal reflux disease)    H/O stem cell transplant (HCC) 03/2017   Headache    Heart murmur    Hiatal hernia    History of 2019 novel coronavirus disease (COVID-19) 09/2018   [facility], hospital admission 10-28-2019 respiratory failure/ pneumometrostinom/ PE ;  was not intubated , treatment with remdesivir / steroids/ actemra / plasma   History of basal cell carcinoma (BCC)    History of colon polyps    History of MRSA infection    culture positive   History of pulmonary embolus (PE) 10/2018   with covid 19, completed treatment with eliquis    History of seizures    11-07-2019  per pt last one 1970s, unknown cause   History of vertebral compression fracture 1988   lumbar   Hx of adenomatous colonic polyps    negative in 7/08   Hyperlipidemia    Hypertension    IFG (impaired fasting glucose)    Neuropathy due to chemotherapeutic drug (HCC)     fingers tips and feet   Nocturia    On supplemental oxygen  therapy    post covid 19;   11-07-2019 pt uses O2 every night 2L via [place],  and uses supplements during the day when walking around neighborhood and goes out to appointments/ store that he has to walk a distance   OSA (obstructive sleep apnea)    study in epic 03-18-2005 mild to moderate   (11-07-2019  per pt used cpap up until when he lost alot of weight, stoped approx. 2012  Pneumonia    Prostate cancer [facility]) urologist--- dr pace/  oncology-- dr Lorri Rota   dx 03/ 2021   Pulmonary fibrosis (HCC) 09/2018   pulmonologist-- dr Waylan Haggard;   post covid 19   Seizure (HCC)    1st in 1976; last in 1978 GTC x 2    SOB (shortness of breath) on exertion    11-07-2019  per pt walks around neighborhood for 30 minutes twice daily, he does have to stop frequently d/t sob and since weather has been hot he uses his supplemental oxygen ;  stated he has a pulse ox. at home , when he is sitting down O2 sat 96 -97% on RA when up walking around the house O2 sat 93-94% on RA;  pt stated he carry's his O2 w/ him when he goes to doctor appts/store d/t distance   Wears glasses    Wears hearing aid in both ears     Past Surgical History:  Procedure Laterality Date   ARTERY BIOPSY Right 09/17/2020   Procedure: RIGHT TEMPORAL ARTERY BIOPSY;  Surgeon: Oralee Billow, MD;  Location: [facility];  Service: General;  Laterality: Right;   CARDIAC CATHETERIZATION  05-21-1999  and 12-29-2000  @[facility]   dr Misty Amour nuclear study;  moderate nonobstructive disease involving LAD and LCx;  second done due to in Reversal research trial   CARDIAC CATHETERIZATION  07/07/2009  dr Abel Hoe   nonobstructive disease w/ normal LVF   CARPAL TUNNEL RELEASE Left 1994   CERVICAL FUSION  1988   C2 -- 4   CYSTOSCOPY N/A 11/13/2019   Procedure: CYSTOSCOPY FLEXIBLE;  Surgeon: Roxane Copp, MD;  Location: [facility];  Service: Urology;  Laterality: N/A;  no seeds detected  per Dr. Valeta Gaudier   FOOT SURGERY Bilateral x3 last one 1980s   bunionectomy great toe--- x2 right and x1 left   KNEE ARTHROSCOPY Left 1996   NASAL SEPTUM SURGERY  x3 last one 08-09-2010 @ [facility]   PAROTIDECTOMY Right 03/09/2017   RADIOACTIVE SEED IMPLANT N/A 11/13/2019   Procedure: RADIOACTIVE SEED IMPLANT/BRACHYTHERAPY IMPLANT;  Surgeon: Roxane Copp, MD;  Location: [facility];  Service: Urology;  Laterality: N/A;  ONLY NEEDS 90 MIN FOR ALL PROCEDURES   REVERSE SHOULDER ARTHROPLASTY Right 07/08/2022   Procedure: REVERSE SHOULDER ARTHROPLASTY;  Surgeon: Winston Hawking, MD;  Location: [facility];  Service: Orthopedics;  Laterality: Right;   ROTATOR CUFF REPAIR Bilateral x1 rigth (unsure date);  x2 left last one 09-07-2009 @[facility] ;  right   SPACE OAR INSTILLATION N/A 11/13/2019   Procedure: SPACE OAR INSTILLATION;  Surgeon: Roxane Copp, MD;  [facility];  Service: Urology;  Laterality: N/A;   TRIGGER FINGER RELEASE Bilateral x2  last one 2001 approx.   x1 finger left hand;  x2 fingers right hand   UMBILICAL HERNIA REPAIR  01/19/2011   VASECTOMY  approx. 1968 (with anesthesia)    Review of Systems:    All systems reviewed and negative except where noted in HPI.    Physical Exam:  BP 122/78   Pulse 64   Ht 5\' 11"  (1.803 m)   Wt 184 lb (83.5 kg)   BMI 25.66 kg/m  No LMP for male patient.  General: Well-nourished, well-developed in no acute distress.  Mouth: Upper and lower dentures are present.  No oral rashes or lesions. Neck: Supple with no thyromegaly, lymphadenopathy, or masses. Lungs: Clear to auscultation bilaterally. Non-labored. Heart: Regular rate and rhythm, no murmurs rubs or  gallops.  Abdomen: Bowel sounds are normal; Abdomen is Soft; No hepatosplenomegaly, masses or hernias;  No Abdominal Tenderness; No guarding or rebound tenderness. Neuro: Alert and oriented x 3.  Grossly intact.  Psych: Alert and cooperative, normal mood and  affect.   Imaging Studies: No results found.  Labs: CBC    Component Value Date/Time   WBC 7.0 06/28/2022 1132   RBC 5.32 06/28/2022 1132   HGB 16.6 06/28/2022 1132   HGB 15.5 05/27/2021 1136   HGB 15.8 11/03/2020 1614   HGB 11.8 (L) 01/30/2017 1329   HCT 49.3 06/28/2022 1132   HCT 47.9 11/03/2020 1614   HCT 34.5 (L) 01/30/2017 1329   PLT 152 06/28/2022 1132   PLT 127 (L) 05/27/2021 1136   PLT 126 (L) 11/03/2020 1614   MCV 92.7 06/28/2022 1132   MCV 95 11/03/2020 1614   MCV 98.9 (H) 01/30/2017 1329   MCH 31.2 06/28/2022 1132   MCHC 33.7 06/28/2022 1132   RDW 13.6 06/28/2022 1132   RDW 15.0 11/03/2020 1614   RDW 13.9 01/30/2017 1329   LYMPHSABS 1.9 04/01/2022 1225   LYMPHSABS 1.4 11/03/2020 1614   LYMPHSABS 1.0 01/30/2017 1329   MONOABS 0.8 04/01/2022 1225   MONOABS 0.8 01/30/2017 1329   EOSABS 0.2 04/01/2022 1225   EOSABS 0.0 11/03/2020 1614   BASOSABS 0.1 04/01/2022 1225   BASOSABS 0.1 11/03/2020 1614   BASOSABS 0.0 01/30/2017 1329    CMP     Component Value Date/Time   NA 143 06/28/2022 1132   NA 139 11/03/2020 1614   NA 142 01/30/2017 1331   K 3.8 06/28/2022 1132   K 3.7 01/30/2017 1331   CL 107 06/28/2022 1132   CO2 25 06/28/2022 1132   CO2 26 01/30/2017 1331   GLUCOSE 101 (H) 06/28/2022 1132   GLUCOSE 111 01/30/2017 1331   BUN 26 (H) 06/28/2022 1132   BUN 36 (H) 11/03/2020 1614   BUN 20.0 01/30/2017 1331   CREATININE 1.27 (H) 06/28/2022 1132   CREATININE 1.30 (H) 05/27/2021 1136   CREATININE 1.2 01/30/2017 1331   CALCIUM  9.3 06/28/2022 1132   CALCIUM  9.1 01/30/2017 1331   PROT 7.1 06/28/2022 1132   PROT 6.3 11/03/2020 1614   PROT 5.6 (L) 01/30/2017 1331   ALBUMIN  4.0 06/28/2022 1132   ALBUMIN  4.3 11/03/2020 1614   ALBUMIN  2.9 (L) 01/30/2017 1331   AST 28 06/28/2022 1132   AST 37 05/27/2021 1136   AST 25 01/30/2017 1331   ALT 24 06/28/2022 1132   ALT 51 (H) 05/27/2021 1136   ALT 17 01/30/2017 1331   ALKPHOS 69 06/28/2022 1132   ALKPHOS  61 01/30/2017 1331   BILITOT 1.8 (H) 06/28/2022 1132   BILITOT 1.5 (H) 05/27/2021 1136   BILITOT 0.68 01/30/2017 1331   GFRNONAA 57 (L) 06/28/2022 1132   GFRNONAA 56 (L) 05/27/2021 1136   GFRAA 58 (L) 12/04/2019 1126       Assessment and Plan:   Johnita Nails. is a 83 y.o. y/o male presents for evaluation of:  Dysphagia - Barium Swallow with Tablet - Pending barium swallow test results, decide if EGD is needed.  GERD - Continue pantoprazole  40 Mg 1 tablet daily. - Continue Mylanta and Pepcid  as needed. -Recommend Lifestyle Modifications to prevent Acid Reflux.  Rec. Avoid coffee, sodas, peppermint, garlic, onions, alcohol , citrus fruits, chocolate, tomatoes, fatty and spicey foods.  Avoid eating 2-3 hours before bedtime.    Multiple Co-Morbidities:  Atrial Fibrillation, Temporal arteritis, AL amyloidosis  with nephrotic syndrome, hypertension, coronary artery disease, aortic stenosis, hyperlipidemia, carotid artery disease, lung disease requiring home oxygen  use, Hx COVID Pneumonia in 2020, CKD 3, Hx Prostate Cancer.  Currently on Eliquis . 12/2022 Echo LVEF 50-55%. - If he needs EGD, then we will need to get cardiac clearance and permission to hold Eliquis .  Alex Canard, [name]  Follow up in 4 weeks with TG.

## 2023-09-05 ENCOUNTER — Encounter: Payer: Self-pay | Admitting: Physician Assistant

## 2023-09-05 ENCOUNTER — Ambulatory Visit (INDEPENDENT_AMBULATORY_CARE_PROVIDER_SITE_OTHER): Admitting: Physician Assistant

## 2023-09-05 VITALS — BP 122/78 | HR 64 | Ht 71.0 in | Wt 184.0 lb

## 2023-09-05 DIAGNOSIS — R1310 Dysphagia, unspecified: Secondary | ICD-10-CM

## 2023-09-05 DIAGNOSIS — K219 Gastro-esophageal reflux disease without esophagitis: Secondary | ICD-10-CM | POA: Diagnosis not present

## 2023-09-05 NOTE — Patient Instructions (Addendum)
 You have been scheduled for a Barium Esophogram at [facility] (1st floor of the hospital) on 09/26/23 at 9:00am. Please arrive 30 minutes prior to your appointment for registration. Make certain not to have anything to eat or drink 3 hours prior to your test. If you need to reschedule for any reason, please contact radiology at [phone] to do so. __________________________________________________________________ A barium swallow is an examination that concentrates on views of the esophagus. This tends to be a double contrast exam (barium and two liquids which, when combined, create a gas to distend the wall of the oesophagus) or single contrast (non-ionic iodine based). The study is usually tailored to your symptoms so a good history is essential. Attention is paid during the study to the form, structure and configuration of the esophagus, looking for functional disorders (such as aspiration, dysphagia, achalasia, motility and reflux) EXAMINATION You may be asked to change into a gown, depending on the type of swallow being performed. A radiologist and radiographer will perform the procedure. The radiologist will advise you of the type of contrast selected for your procedure and direct you during the exam. You will be asked to stand, sit or lie in several different positions and to hold a small amount of fluid in your mouth before being asked to swallow while the imaging is performed .In some instances you may be asked to swallow barium coated marshmallows to assess the motility of a solid food bolus. The exam can be recorded as a digital or video fluoroscopy procedure. POST PROCEDURE It will take 1-2 days for the barium to pass through your system. To facilitate this, it is important, unless otherwise directed, to increase your fluids for the next 24-48hrs and to resume your normal diet.  This test typically takes about 30 minutes to  perform. __________________________________________________________________________________  Please follow up sooner if symptoms increase or worsen  Due to recent changes in healthcare laws, you may see the results of your imaging and laboratory studies on MyChart before your provider has had a chance to review them.  We understand that in some cases there may be results that are confusing or concerning to you. Not all laboratory results come back in the same time frame and the provider may be waiting for multiple results in order to interpret others.  Please give us  48 hours in order for your provider to thoroughly review all the results before contacting the office for clarification of your results.   _______________________________________________________  If your blood pressure at your visit was 140/90 or greater, please contact your primary care physician to follow up on this.  _______________________________________________________  If you are age 81 or older, your body mass index should be between 23-30. Your Body mass index is 25.66 kg/m. If this is out of the aforementioned range listed, please consider follow up with your Primary Care Provider.  If you are age 81 or younger, your body mass index should be between 19-25. Your Body mass index is 25.66 kg/m. If this is out of the aformentioned range listed, please consider follow up with your Primary Care Provider.   ________________________________________________________  The [facility] providers would like to encourage you to use MYCHART to communicate with providers for non-urgent requests or questions.  Due to long hold times on the telephone, sending your provider a message by [facility] may be a faster and more efficient way to get a response.  Please allow 48 business hours for a response.  Please remember that this is  for non-urgent requests.  _______________________________________________________ Thank you for trusting me with  your gastrointestinal care!   [name], PA

## 2023-09-11 ENCOUNTER — Telehealth: Payer: Self-pay

## 2023-09-11 NOTE — Telephone Encounter (Signed)
 Tried to call [facility] back unable to reach

## 2023-09-11 NOTE — Telephone Encounter (Signed)
 Copied from CRM [id number]. Topic: General - Other >> Sep 08, 2023 10:31 AM Alverda Joe S wrote: Reason for CRM: [facility]l [facility] calling to see if [facility] has an upcoming appointment. She also wants to know if we received a fax from them for patient, please call at [phone]   Routing to [facility], Dr. Arvin Laundry nurse.

## 2023-09-12 NOTE — Telephone Encounter (Signed)
 [facility] has [facility]. On documentation NFN

## 2023-09-12 NOTE — Progress Notes (Signed)
 Reviewed. Very high risk patient as noted. Avoid endoscopy unless absolutely necessary.  Await barium swallow. Thanks

## 2023-09-26 ENCOUNTER — Ambulatory Visit (HOSPITAL_COMMUNITY)
Admission: RE | Admit: 2023-09-26 | Discharge: 2023-09-26 | Disposition: A | Discharge status: Home / Self Care | Source: Ambulatory Visit | Attending: Physician Assistant | Admitting: Physician Assistant

## 2023-09-26 ENCOUNTER — Ambulatory Visit: Payer: Self-pay | Admitting: Physician Assistant

## 2023-09-26 DIAGNOSIS — K224 Dyskinesia of esophagus: Secondary | ICD-10-CM | POA: Diagnosis not present

## 2023-09-26 DIAGNOSIS — K219 Gastro-esophageal reflux disease without esophagitis: Secondary | ICD-10-CM | POA: Diagnosis not present

## 2023-09-26 DIAGNOSIS — R1310 Dysphagia, unspecified: Secondary | ICD-10-CM | POA: Diagnosis not present

## 2023-09-26 DIAGNOSIS — K449 Diaphragmatic hernia without obstruction or gangrene: Secondary | ICD-10-CM | POA: Diagnosis not present

## 2023-09-26 NOTE — Progress Notes (Signed)
 Call and notify patient barium swallow test shows: 1.  Moderate lower esophageal stricture where a barium tablet was delayed.  We need to schedule an EGD with esophageal dilation. 2.  Prominent cricopharyngeal muscle in the upper cervical esophagus. 3.  Moderate esophageal dysmotility. 4.  Small hiatal hernia. Patient previously saw [name].  Please schedule EGD with [name].  Patient needs cardiac clearance and permission to hold Eliquis  2 days prior to EGD with dilation. [name], [name]  CC: [name] for FYI

## 2023-09-26 NOTE — H&P (View-Only) (Signed)
 Call and notify patient barium swallow test shows: 1.  Moderate lower esophageal stricture where a barium tablet was delayed.  We need to schedule an EGD with esophageal dilation. 2.  Prominent cricopharyngeal muscle in the upper cervical esophagus. 3.  Moderate esophageal dysmotility. 4.  Small hiatal hernia. Patient previously saw [name].  Please schedule EGD with [name].  Patient needs cardiac clearance and permission to hold Eliquis  2 days prior to EGD with dilation. [name], [name]  CC: [name] for FYI

## 2023-09-27 ENCOUNTER — Telehealth: Payer: Self-pay | Admitting: Physician Assistant

## 2023-09-27 ENCOUNTER — Telehealth: Payer: Self-pay | Admitting: *Deleted

## 2023-09-27 NOTE — Telephone Encounter (Signed)
 Inbound call from patient requesting a call from Dottie regarding questions he has for medications. Please advise, thank you

## 2023-09-27 NOTE — Telephone Encounter (Signed)
 Request for surgical clearance:     Endoscopy Procedure  What type of surgery is being performed?     Endoscopy (possible dilation)  When is this surgery scheduled?     TBD  What type of clearance is required ?   Pharmacy AND medical/cardiac  Are there any medications that need to be held prior to surgery and how long? Eliquis , 2 days  Practice name and name of physician performing surgery?      [facility]  What is your office phone and fax number?      Phone- [phone]  Fax- [phone]  Anesthesia type (None, local, MAC, general) ?       [facility]  Please route your response to Rudi Corwin, RN  Thank you!

## 2023-09-27 NOTE — Telephone Encounter (Signed)
 Good Morning [name]  We have received a surgical clearance request for upcoming endoscopy and possible dilatation. They were seen recently in clinic on 07/10/2023 and was doing well with no complaints of angina or chest pain. Can you please comment on surgical clearance for his upcoming endoscopy procedure. Please forward you guidance and recommendations to [facility] PREOP  Thanks,  Charles Connor, NP

## 2023-09-27 NOTE — Telephone Encounter (Signed)
   Patient Name: Alex [name].  DOB: 1941/04/18 MRN: [id number]  Primary Cardiologist: Antoinette Batman, MD  Chart reviewed as part of pre-operative protocol coverage. Given past medical history and time since last visit, based on ACC/AHA guidelines, Alex [name]. is at acceptable risk for the planned procedure without further cardiovascular testing.   Guidance for holding Eliquis  will need to come from prescribing provider and is currently not managed by cardiology.  The patient was advised that if he develops new symptoms prior to surgery to contact our office to arrange for a follow-up visit, and he verbalized understanding.  I will route this recommendation to the requesting party via Epic fax function and remove from pre-op pool.  Please call with questions.  Francene Ing, Retha Cast, NP 09/27/2023, 11:44 AM

## 2023-09-27 NOTE — Telephone Encounter (Signed)
 Patient called and stated that he would like to speak to [facility] in regarding to His blood thinner medication. Patient is requesting a call back. Please advise.

## 2023-09-28 ENCOUNTER — Other Ambulatory Visit: Payer: Self-pay | Admitting: *Deleted

## 2023-09-28 DIAGNOSIS — K449 Diaphragmatic hernia without obstruction or gangrene: Secondary | ICD-10-CM

## 2023-09-28 DIAGNOSIS — K224 Dyskinesia of esophagus: Secondary | ICD-10-CM

## 2023-09-28 DIAGNOSIS — K222 Esophageal obstruction: Secondary | ICD-10-CM

## 2023-09-28 DIAGNOSIS — R1310 Dysphagia, unspecified: Secondary | ICD-10-CM

## 2023-09-28 DIAGNOSIS — K219 Gastro-esophageal reflux disease without esophagitis: Secondary | ICD-10-CM

## 2023-09-28 NOTE — Telephone Encounter (Signed)
 Patient is reminded that we are reaching out to Alex Boehringer, [name] who prescribes his Eliquis  to get clearance/instructions for Eliquis  medication prior to procedure. He SHOULD NOT STOP this medication until given clearance by Alex [name] or contacted by our office. Patient verbalizes understanding.

## 2023-09-28 NOTE — Telephone Encounter (Signed)
 I have spoken to patient to advise that we are working to locate [name], [name] who prescribes his coumadin from the [place]. Once we get Alex [name]'s contact information, I will reach out to request Eliquis  hold.

## 2023-09-28 NOTE — Telephone Encounter (Signed)
 Patient stated he thought he was advised to stop Eliquis  now but received instructions that states not to stop Eliquis  as of now. Patient is also requesting update. Please advise, thank you

## 2023-09-28 NOTE — Telephone Encounter (Signed)
 I was able to get in touch with Avis Boehringer, [name] office at [facility]. Phone number to the office is [phone] extension [phone] Fax:[phone]. I have sent an electronic fax and manual fax for anticoag clearance request.

## 2023-09-29 DIAGNOSIS — C61 Malignant neoplasm of prostate: Secondary | ICD-10-CM | POA: Diagnosis not present

## 2023-10-06 DIAGNOSIS — R3915 Urgency of urination: Secondary | ICD-10-CM | POA: Diagnosis not present

## 2023-10-06 DIAGNOSIS — C61 Malignant neoplasm of prostate: Secondary | ICD-10-CM | POA: Diagnosis not present

## 2023-10-06 DIAGNOSIS — N401 Enlarged prostate with lower urinary tract symptoms: Secondary | ICD-10-CM | POA: Diagnosis not present

## 2023-10-06 NOTE — Telephone Encounter (Signed)
 Contacted PA, Alex [name]'s office and was transferred to the nurse triage voicemail. I have left a message asking for anticoagulation clearance x 2 days for patients Eliquis  during upcoming 10/19/23 procedure.  Will also refax original letter sent 09/28/23.

## 2023-10-11 ENCOUNTER — Telehealth: Payer: Self-pay | Admitting: Gastroenterology

## 2023-10-11 DIAGNOSIS — E8581 Light chain (AL) amyloidosis: Secondary | ICD-10-CM | POA: Diagnosis not present

## 2023-10-11 DIAGNOSIS — G622 Polyneuropathy due to other toxic agents: Secondary | ICD-10-CM | POA: Diagnosis not present

## 2023-10-11 DIAGNOSIS — I25119 Atherosclerotic heart disease of native coronary artery with unspecified angina pectoris: Secondary | ICD-10-CM | POA: Diagnosis not present

## 2023-10-11 DIAGNOSIS — J8410 Pulmonary fibrosis, unspecified: Secondary | ICD-10-CM | POA: Diagnosis not present

## 2023-10-11 DIAGNOSIS — Z9481 Bone marrow transplant status: Secondary | ICD-10-CM | POA: Diagnosis not present

## 2023-10-11 DIAGNOSIS — J9610 Chronic respiratory failure, unspecified whether with hypoxia or hypercapnia: Secondary | ICD-10-CM | POA: Diagnosis not present

## 2023-10-11 DIAGNOSIS — I4891 Unspecified atrial fibrillation: Secondary | ICD-10-CM | POA: Diagnosis not present

## 2023-10-11 DIAGNOSIS — I719 Aortic aneurysm of unspecified site, without rupture: Secondary | ICD-10-CM | POA: Diagnosis not present

## 2023-10-11 DIAGNOSIS — E785 Hyperlipidemia, unspecified: Secondary | ICD-10-CM | POA: Diagnosis not present

## 2023-10-11 DIAGNOSIS — I129 Hypertensive chronic kidney disease with stage 1 through stage 4 chronic kidney disease, or unspecified chronic kidney disease: Secondary | ICD-10-CM | POA: Diagnosis not present

## 2023-10-11 DIAGNOSIS — D6869 Other thrombophilia: Secondary | ICD-10-CM | POA: Diagnosis not present

## 2023-10-11 NOTE — Telephone Encounter (Signed)
 Procedure: Endoscopy Procedure date: 10/19/23 Procedure location: [facility] Arrival Time: 7:45 am Spoke with the patient Y/N: Yes Any prep concerns? No  Has the patient obtained the prep from the pharmacy ? No prep needed Do you have a care partner and transportation: Yes Any additional concerns? No

## 2023-10-11 NOTE — Telephone Encounter (Signed)
 Spoke to [place] at [facility] who says they faxed a response last week to our office. Since I did not receive this, it was refaxed and has now been received.   Note is as follows:  October 06, 2023  [id number], Alex [name] (Feb 19, 1941)  To Whom It May Concern:  [name] (dob 1940-09-07) is under the care of the cardiology clinic at the [facility] for a history of atrial fibrillation. One his most recent visit 02/09/23 he remained symptomatically stable. He can hold his apixaban  for 2 days prior to his upcoming GI procedure.  Thank you for your assistance with this matter. Please do not hesitate to contact me if I can be of any further assistance.  Kind regards,   Avis Boehringer, [name] Cardiology [facility]  [place] [phone] ext [phone]  I have spoken to patient and his wife to advised that we have been given clearance for him to hold his Eliquis  48 hours prior to his upcoming endoscopic procedure. Patient and his wife both verbalize understanding of this information.

## 2023-10-18 NOTE — Telephone Encounter (Signed)
 Called the patient's wife and explained to her the address and direction to the hospital.

## 2023-10-18 NOTE — Telephone Encounter (Signed)
 Patient wife called and stated that she would like to speak to the nurse regarding her husband endoscopy procedure happening tomorrow. Patient is aware that her husband procedure is for tomorrow with an arrival time of 7:45 am at [facility]. Patient is wanting to speak to the nurse to make sure she has the correct address for his procedure. Please advise.

## 2023-10-19 ENCOUNTER — Ambulatory Visit (HOSPITAL_COMMUNITY)
Admission: RE | Admit: 2023-10-19 | Discharge: 2023-10-19 | Disposition: A | Discharge status: Home / Self Care | Attending: Gastroenterology | Admitting: Gastroenterology

## 2023-10-19 ENCOUNTER — Ambulatory Visit (HOSPITAL_COMMUNITY): Admitting: Anesthesiology

## 2023-10-19 ENCOUNTER — Encounter (HOSPITAL_COMMUNITY): Payer: Self-pay | Admitting: Gastroenterology

## 2023-10-19 ENCOUNTER — Encounter (HOSPITAL_COMMUNITY)
Admission: RE | Disposition: A | Discharge status: Home / Self Care | Payer: Self-pay | Source: Home / Self Care | Attending: Gastroenterology

## 2023-10-19 DIAGNOSIS — K219 Gastro-esophageal reflux disease without esophagitis: Secondary | ICD-10-CM | POA: Insufficient documentation

## 2023-10-19 DIAGNOSIS — K224 Dyskinesia of esophagus: Secondary | ICD-10-CM | POA: Insufficient documentation

## 2023-10-19 DIAGNOSIS — K449 Diaphragmatic hernia without obstruction or gangrene: Secondary | ICD-10-CM | POA: Insufficient documentation

## 2023-10-19 DIAGNOSIS — G4730 Sleep apnea, unspecified: Secondary | ICD-10-CM | POA: Insufficient documentation

## 2023-10-19 DIAGNOSIS — K222 Esophageal obstruction: Secondary | ICD-10-CM | POA: Insufficient documentation

## 2023-10-19 DIAGNOSIS — R0602 Shortness of breath: Secondary | ICD-10-CM | POA: Insufficient documentation

## 2023-10-19 DIAGNOSIS — M1990 Unspecified osteoarthritis, unspecified site: Secondary | ICD-10-CM | POA: Diagnosis not present

## 2023-10-19 DIAGNOSIS — I10 Essential (primary) hypertension: Secondary | ICD-10-CM | POA: Diagnosis not present

## 2023-10-19 DIAGNOSIS — K317 Polyp of stomach and duodenum: Secondary | ICD-10-CM

## 2023-10-19 DIAGNOSIS — E119 Type 2 diabetes mellitus without complications: Secondary | ICD-10-CM | POA: Insufficient documentation

## 2023-10-19 DIAGNOSIS — K2970 Gastritis, unspecified, without bleeding: Secondary | ICD-10-CM | POA: Insufficient documentation

## 2023-10-19 DIAGNOSIS — R1310 Dysphagia, unspecified: Secondary | ICD-10-CM | POA: Diagnosis not present

## 2023-10-19 DIAGNOSIS — I517 Cardiomegaly: Secondary | ICD-10-CM | POA: Insufficient documentation

## 2023-10-19 DIAGNOSIS — R569 Unspecified convulsions: Secondary | ICD-10-CM | POA: Diagnosis not present

## 2023-10-19 DIAGNOSIS — I2510 Atherosclerotic heart disease of native coronary artery without angina pectoris: Secondary | ICD-10-CM

## 2023-10-19 DIAGNOSIS — Q399 Congenital malformation of esophagus, unspecified: Secondary | ICD-10-CM | POA: Insufficient documentation

## 2023-10-19 DIAGNOSIS — R519 Headache, unspecified: Secondary | ICD-10-CM | POA: Insufficient documentation

## 2023-10-19 DIAGNOSIS — Z87891 Personal history of nicotine dependence: Secondary | ICD-10-CM | POA: Diagnosis not present

## 2023-10-19 DIAGNOSIS — I351 Nonrheumatic aortic (valve) insufficiency: Secondary | ICD-10-CM | POA: Diagnosis not present

## 2023-10-19 HISTORY — PX: ESOPHAGOGASTRODUODENOSCOPY: SHX5428

## 2023-10-19 SURGERY — EGD (ESOPHAGOGASTRODUODENOSCOPY)
Anesthesia: Monitor Anesthesia Care

## 2023-10-19 MED ORDER — SODIUM CHLORIDE 0.9 % IV SOLN: INTRAVENOUS | Status: DC | PRN

## 2023-10-19 MED ORDER — PROPOFOL 500 MG/50ML IV EMUL
INTRAVENOUS | Status: DC | PRN
  Administered 2023-10-19: 100 ug/kg/min via INTRAVENOUS

## 2023-10-19 MED ORDER — GLYCOPYRROLATE 0.2 MG/ML IJ SOLN
INTRAMUSCULAR | Status: DC | PRN
  Administered 2023-10-19: .2 mg via INTRAVENOUS

## 2023-10-19 MED ORDER — SODIUM CHLORIDE 0.9 % IV SOLN: INTRAVENOUS | Status: DC

## 2023-10-19 MED ORDER — PANTOPRAZOLE SODIUM 40 MG PO TBEC: 40.0000 mg | DELAYED_RELEASE_TABLET | Freq: Two times a day (BID) | ORAL | 1 refills | Status: AC

## 2023-10-19 NOTE — Op Note (Signed)
 [facility] Patient Name: Alex [name] Procedure Date : 10/19/2023 MRN: [id number] Attending MD: Sandor Flatter , MD, [id number] Date of Birth: [birth year] CSN: [id number] Age: 83 Admit Type: Outpatient Procedure:                Upper GI endoscopy Indications:              Dysphagia, Suspected esophageal reflux, Abnormal                            cine-esophagram Providers:                Sandor Flatter, MD, Almarie Pizza, RN, [facility]                            [place], Technician, Cottie Lobe, CRNA Referring MD:              Medicines:                Monitored Anesthesia Care Complications:            No immediate complications. Estimated Blood Loss:     Estimated blood loss was minimal. Procedure:                Pre-Anesthesia Assessment:                           - Prior to the procedure, a History and Physical                            was performed, and patient medications and                            allergies were reviewed. The patient's tolerance of                            previous anesthesia was also reviewed. The risks                            and benefits of the procedure and the sedation                            options and risks were discussed with the patient.                            All questions were answered, and informed consent                            was obtained. Prior Anticoagulants: The patient has                            taken Eliquis  (apixaban ), last dose was 2 days                            prior to procedure. ASA Grade Assessment: III - A  patient with severe systemic disease. After                            reviewing the risks and benefits, the patient was                            deemed in satisfactory condition to undergo the                            procedure.                           After obtaining informed consent, the endoscope was                            passed under  direct vision. Throughout the                            procedure, the patient's blood pressure, pulse, and                            oxygen  saturations were monitored continuously. The                            GIF-H190 ([id number]) Olympus endoscope was introduced                            through the mouth, and advanced to the second part                            of duodenum. The upper GI endoscopy was                            accomplished without difficulty. The patient                            tolerated the procedure well. Scope In: Scope Out: Findings:      One benign-appearing, intrinsic moderate stenosis was found 41 cm from       the incisors. This stenosis measured less than one cm (in length). The       stenosis was traversed. A TTS dilator was passed through the scope.       Dilation with a 15-16.5-18 mm balloon dilator was performed to 16.5 mm.       The dilation site was examined and showed mild mucosal disruption x2 and       moderate improvement in luminal narrowing. Estimated blood loss was       minimal.      A small, 1 cm sliding type hiatal hernia was present.      The lower third of the esophagus was mildly tortuous.      A single 3 mm sessile polyp with no bleeding and no stigmata of recent       bleeding was found in the gastric antrum. Biopsies were taken with a       cold forceps for histology. Estimated blood loss was minimal.  Patchy mild inflammation characterized by congestion (edema) and       erythema was found in the gastric body and in the gastric antrum.       Biopsies were taken with a cold forceps for Helicobacter pylori testing.       Estimated blood loss was minimal.      The examined duodenum was normal. Impression:               - Benign-appearing esophageal stenosis. Dilated                            with 16.5 mm TTS balloon with appropriate mucosal                            disruption, consistent with successful dilation.                            - 1 cm hiatal hernia.                           - Tortuous esophagus.                           - A single gastric polyp. Biopsied.                           - Gastritis. Biopsied.                           - Normal examined duodenum. Recommendation:           - Patient has a contact number available for                            emergencies. The signs and symptoms of potential                            delayed complications were discussed with the                            patient. Return to normal activities tomorrow.                            Written discharge instructions were provided to the                            patient.                           - Soft diet today, then advance as tolerated                            tomorrow per post dilation protocol.                           - Await pathology results.                           -  Resume Eliquis  (apixaban ) at prior dose tomorrow.                           - Increase Protonix  (pantoprazole ) to 40 mg PO BID                            for 4 weeks to promote mucosal healing, then reduce                            to 40 mg daily for continued control of reflux.                           - Repeat upper endoscopy as needed for repeat                            esophageal dilation if continued or recurrent                            dysphagia symptoms.                           - Return to GI clinic in 3 months, or sooner as                            needed. Procedure Code(s):        --- Professional ---                           [phone], Esophagogastroduodenoscopy, flexible,                            transoral; with transendoscopic balloon dilation of                            esophagus (less than 30 mm diameter)                           43239, 59, Esophagogastroduodenoscopy, flexible,                            transoral; with biopsy, single or multiple Diagnosis Code(s):        --- Professional ---                            K22.2, Esophageal obstruction                           K44.9, Diaphragmatic hernia without obstruction or                            gangrene                           Q39.9, Congenital malformation of esophagus,  unspecified                           K31.7, Polyp of stomach and duodenum                           K29.70, Gastritis, unspecified, without bleeding                           R13.10, Dysphagia, unspecified                           R93.3, Abnormal findings on diagnostic imaging of                            other parts of digestive tract CPT copyright 2022 American Medical Association. All rights reserved. The codes documented in this report are preliminary and upon coder review may  be revised to meet current compliance requirements. Sandor Flatter, MD 10/19/2023 10:07:03 AM Number of Addenda: 0

## 2023-10-19 NOTE — Progress Notes (Signed)
 Ellouise Console, [name] [address] [place], [place]  [address] Phone: [phone]   Primary Care Physician: Loreli Elsie JONETTA Mickey., MD  Primary Gastroenterologist:  Ellouise Console, [name] / Norleen Kiang, MD   Chief Complaint:  F/U GERD, Dysphagia, Esophageal Stricture       HPI:   Alex Newsham. is a 83 y.o. male returns for follow-up of dysphagia and distal esophageal stricture.  He is here today with his wife.  09/26/23 barium swallow with tablet: 1.  Moderate lower esophageal stricture where a barium tablet was delayed.  We need to schedule an EGD with esophageal dilation. 2.  Prominent cricopharyngeal muscle in the upper cervical esophagus. 3.  Moderate esophageal dysmotility. 4.  Small hiatal hernia.  Underwent EGD yesterday 10/19/23 by DR. Cirigliano which showed: 1 benign-appearing distal esophageal stenosis dilated with TTS dilator did to 16.5 mm.  Small 1 cm sliding hiatal hernia.  Mildly torturous lower esophagus.  A single gastric polyp.  Mild gastritis.  Biopsy results are pending.  Pantoprazole  was increased to 40 mg twice daily for 4 weeks.  Current Symptoms: He states his procedure went well yesterday.  He denies chest pain.  Was able to eat bacon and eggs for breakfast this morning without difficulty.  Starting back on Eliquis  tomorrow.  Has increased pantoprazole  to 40 mg twice daily.  No current GI symptoms or concerns.  PMH:  Atrial Fibrillation, Temporal arteritis, AL amyloidosis with nephrotic syndrome, hypertension, coronary artery disease, aortic stenosis, hyperlipidemia, carotid artery disease, lung disease requiring home oxygen  use, Hx COVID Pneumonia in 2020, CKD 3, Hx Prostate Cancer.  Currently on Eliquis . 12/2022 Echo LVEF 50-55%.   11/2020 Virtual Colonoscopy (ordered by Dr. Kiang): Diverticulosis, Hepatic Steatosis, CAD.  NO Polyps.  No repeat.  Current Outpatient Medications  Medication Sig Dispense Refill   albuterol  (ACCUNEB ) 1.25 MG/3ML nebulizer  solution Take 1 ampule by nebulization every 6 (six) hours as needed for shortness of breath or wheezing.     apixaban  (ELIQUIS ) 5 MG TABS tablet Take 5 mg by mouth 2 (two) times daily.     Ascorbic Acid  (VITAMIN C ) 1000 MG tablet Take 1,000 mg by mouth daily.     atorvastatin  (LIPITOR) 40 MG tablet Take 40 mg by mouth daily.     capsaicin (ZOSTRIX) 0.025 % cream Apply 1 Application topically as needed (aches and pains).     carvedilol  (COREG ) 6.25 MG tablet Take 3.125-6.25 mg by mouth See admin instructions. 6.25 mg in the morning, 3.125 mg in the evening     Cholecalciferol  125 MCG (5000 UT) TABS Take 5,000 Units by mouth 2 (two) times a day.     diclofenac Sodium (VOLTAREN) 1 % GEL Apply topically.     ezetimibe  (ZETIA ) 10 MG tablet Take 10 mg by mouth daily.     fluticasone  (FLONASE ) 50 MCG/ACT nasal spray Place 2 sprays into both nostrils daily. 16 g 5   furosemide  (LASIX ) 40 MG tablet Take 40 mg by mouth daily.      Ipratropium-Albuterol  (COMBIVENT RESPIMAT) 20-100 MCG/ACT AERS respimat Inhale 1 puff into the lungs every 6 (six) hours as needed for wheezing or shortness of breath.     isosorbide  mononitrate (IMDUR ) 30 MG 24 hr tablet Take 30 mg by mouth daily.     lidocaine  (LIDODERM ) 5 % Place 1 patch onto the skin daily as needed (pain).     Multiple Vitamin (MULTI-VITAMINS) TABS Take 1 tablet by mouth at bedtime.  mupirocin  ointment (BACTROBAN ) 2 % Apply 1 Application topically daily as needed (wound care).     nitroGLYCERIN  (NITROSTAT ) 0.4 MG SL tablet DISSOLVE ONE TABLET UNDER TONGUE EVERY 5 MINUTES AS NEEDED UP TO 3 DOSES, IF NORELIEF CALL 911 25 tablet 11   pantoprazole  (PROTONIX ) 40 MG tablet Take 1 tablet (40 mg total) by mouth 2 (two) times daily. 180 tablet 1   potassium chloride  SA (KLOR-CON ) 20 MEQ tablet Take 20 mEq by mouth daily.     Probiotic Product (PROBIOTIC-10 PO) Take 1 capsule by mouth daily.     tamsulosin  (FLOMAX ) 0.4 MG CAPS capsule Take 0.4 mg by mouth 2 (two)  times daily.     triamcinolone  cream (KENALOG ) 0.1 % Apply 1 Application topically daily as needed (irritation).     vitamin B-12 (CYANOCOBALAMIN) 250 MCG tablet Take 500 mcg by mouth daily.     zinc gluconate 50 MG tablet Take 50 mg by mouth daily.     No current facility-administered medications for this visit.    Allergies as of 10/20/2023 - Review Complete 10/20/2023  Allergen Reaction Noted   Ramipril Cough 07/06/2017   Augmentin [amoxicillin-pot clavulanate] Diarrhea 03/24/2009    Past Medical History:  Diagnosis Date   AL amyloidosis [facility]) oncologist-- dr onesimo   Renal and Systemic , dx 04/ 2018;  renal bx 08-05-2016 and bone marrow bx 08-29-2016;  8 month chemotherapy then stell cell transplant 12/ 2018 then chemo again;  since then been on oral chemo daily (11-07-2019 per pt has been on hold until after prostate seed implants)   Arthritis    Atypical chest pain    CAD (coronary artery disease) cardiologist-- dr verlin;  also [facility] [place], dr d. sarah   moderate nonobstrucive disease by cath 2011; Lexiscan  Myoview  6/14--Normal study, no scar or ischemia, EF 62%;  11-14-2014 nuclear study low risk w/ normal perfusion, nuclear ef 67%   CKD (chronic kidney disease), stage III (HCC)    COVID    b/l PNA (7/20)- 21 day hospitalization tx with steroids, Remdesevir, Actemra , Hydroxychloroquine ) --> PE on Eliquis .   Diverticulosis of colon    Dyspnea    WITH EXERTION    ED (erectile dysfunction)    Fatty liver    GERD (gastroesophageal reflux disease)    H/O stem cell transplant (HCC) 03/2017   Headache    Heart murmur    Hiatal hernia    History of 2019 novel coronavirus disease (COVID-19) 09/2018   [facility] 06[facility], hospital admission 10-28-2019 respiratory failure/ pneumometrostinom/ PE ;  was not intubated , treatment with remdesivir / steroids/ actemra / plasma   History of basal cell carcinoma (BCC)    History of colon  polyps    History of MRSA infection    culture positive   History of pulmonary embolus (PE) 10/2018   with covid 19, completed treatment with eliquis    History of seizures    11-07-2019  per pt last one 1970s, unknown cause   History of vertebral compression fracture 1988   lumbar   Hx of adenomatous colonic polyps    negative in 7/08   Hyperlipidemia    Hypertension    IFG (impaired fasting glucose)    Neuropathy due to chemotherapeutic drug (HCC)    fingers tips and feet   Nocturia    On supplemental oxygen  therapy    post covid 19;   11-07-2019 pt uses O2 every night 2L via [place],  and uses supplements during the day when walking around neighborhood and goes out to appointments/ store that he has to walk a distance   OSA (obstructive sleep apnea)    study in epic 03-18-2005 mild to moderate   (11-07-2019  per pt used cpap up until when he lost alot of weight, stoped approx. 2012   Pneumonia    Prostate cancer [facility]) urologist--- dr pace/  oncology-- dr patrcia   dx 03/ 2021   Pulmonary fibrosis (HCC) 09/2018   pulmonologist-- dr theophilus;   post covid 19   Seizure (HCC)    1st in 1976; last in 1978 GTC x 2    SOB (shortness of breath) on exertion    11-07-2019  per pt walks around neighborhood for 30 minutes twice daily, he does have to stop frequently d/t sob and since weather has been hot he uses his supplemental oxygen ;  stated he has a pulse ox. at home , when he is sitting down O2 sat 96 -97% on RA when up walking around the house O2 sat 93-94% on RA;  pt stated he carry's his O2 w/ him when he goes to doctor appts/store d/t distance   Wears glasses    Wears hearing aid in both ears     Past Surgical History:  Procedure Laterality Date   ARTERY BIOPSY Right 09/17/2020   Procedure: RIGHT TEMPORAL ARTERY BIOPSY;  Surgeon: Eletha Boas, MD;  Location: [facility];  Service: General;  Laterality: Right;   CARDIAC CATHETERIZATION  05-21-1999  and 12-29-2000  @[facility]   dr obie bud  nuclear study;  moderate nonobstructive disease involving LAD and LCx;  second done due to in Reversal research trial   CARDIAC CATHETERIZATION  07/07/2009  dr verlin   nonobstructive disease w/ normal LVF   CARPAL TUNNEL RELEASE Left 1994   CERVICAL FUSION  1988   C2 -- 4   CYSTOSCOPY N/A 11/13/2019   Procedure: CYSTOSCOPY FLEXIBLE;  Surgeon: Elisabeth Valli BIRCH, MD;  Location: [facility];  Service: Urology;  Laterality: N/A;  no seeds detected per Dr. Elisabeth   FOOT SURGERY Bilateral x3 last one 1980s   bunionectomy great toe--- x2 right and x1 left   KNEE ARTHROSCOPY Left 1996   NASAL SEPTUM SURGERY  x3 last one 08-09-2010 @ [facility]   PAROTIDECTOMY Right 03/09/2017   RADIOACTIVE SEED IMPLANT N/A 11/13/2019   Procedure: RADIOACTIVE SEED IMPLANT/BRACHYTHERAPY IMPLANT;  Surgeon: Elisabeth Valli BIRCH, MD;  Location[facility] ;  Service: Urology;  Laterality: N/A;  ONLY NEEDS 90 MIN FOR ALL PROCEDURES   REVERSE SHOULDER ARTHROPLASTY Right 07/08/2022   Procedure: REVERSE SHOULDER ARTHROPLASTY;  Surgeon: Kay Kemps, MD;  Location: [facility];  Service: Orthopedics;  Laterality: Right;   ROTATOR CUFF REPAIR Bilateral x1 rigth (unsure date);  x2 left last one 09-07-2009 @[facility] ;  right   SPACE OAR INSTILLATION N/A 11/13/2019   Procedure: SPACE OAR INSTILLATION;  Surgeon: Elisabeth Valli BIRCH, MD;  Location: [facility];  Service: Urology;  Laterality: N/A;   TRIGGER FINGER RELEASE Bilateral x2  last one 2001 approx.   x1 finger left hand;  x2 fingers right hand   UMBILICAL HERNIA REPAIR  01/19/2011   VASECTOMY  approx. 1968 (with anesthesia)    Review of Systems:    All systems reviewed and negative except where noted in HPI.    Physical Exam:  BP 120/62   Pulse 68   Ht 5' 11 (1.803 m)   [employer]  185 lb (83.9 kg)   BMI 25.80 kg/m  No LMP for male patient.  General: Well-nourished, well-developed in no acute distress.  Lungs: Clear to auscultation bilaterally.  Non-labored. Heart: Regular rate and rhythm, no murmurs rubs or gallops.  Abdomen: Bowel sounds are normal; Abdomen is Soft; No hepatosplenomegaly, masses or hernias;  No Abdominal Tenderness; No guarding or rebound tenderness. Neuro: Alert and oriented x 3.  Grossly intact.  Psych: Alert and cooperative, normal mood and affect.   Imaging Studies: DG ESOPHAGUS W SINGLE CM (SOL OR THIN BA) Addendum Date: 09/26/2023 ADDENDUM REPORT: 09/26/2023 14:24 ADDENDUM: Impression #1 will be called to the ordering clinician or representative by the Radiologist Assistant, and communication documented in the PACS or [employer]. Electronically Signed   By: Rockey Childs D.O.   On: 09/26/2023 14:24   Result Date: 09/26/2023 CLINICAL DATA:  Provided history: Gastroesophageal reflux disease, unspecified whether esophagitis present. Dysphagia, unspecified type. EXAM: ESOPHAGUS/BARIUM SWALLOW/TABLET STUDY TECHNIQUE: A single contrast examination was performed using thin liquid barium. Additionally, the patient swallowed a 13 mm barium tablet under fluoroscopy. The exam was performed by Sari Lamp, [name], and was supervised and interpreted by Dr. Rockey Childs. FLUOROSCOPY: Radiation Exposure Index (as provided by the fluoroscopic device): 74.30 mGy Kerma COMPARISON:  Chest CT 04/08/2022. FINDINGS: A swallowed 13 mm barium tablet was significantly delayed at the level of the distal esophagus/gastroesophageal junction, ultimately passing into the stomach after 10 minutes with the patient taking additional sips of water  and barium contrast, and taking bites of a graham cracker. Findings are concerning for the presence of a stricture at this site. Prominent cricopharyngeus muscle impression upon the hypopharynx/cervical esophagus. Esophagus mildly patulous elsewhere. Moderate esophageal dysmotility with tertiary contractions and intermittently delayed contrast transit. Small hiatal hernia. No gastroesophageal reflux observed.  IMPRESSION: 1. A swallowed 13 mm barium tablet was significantly delayed at the level of the distal esophagus/gastroesophageal junction, ultimately passing into the stomach after 10 minutes with the patient taking additional sips of water  and barium contrast, and taking bites of a Graham cracker. Findings are concerning for the presence of a stricture at this level, and endoscopy should be considered for further evaluation. 2. Prominent cricopharyngeus muscle impression upon the hypopharynx/cervical esophagus. 3. Mildly patulous esophagus elsewhere. 4. Moderate esophageal dysmotility. 5. Small hiatal hernia. Electronically Signed: By: Rockey Childs D.O. On: 09/26/2023 11:56    Labs: CBC    Component Value Date/Time   WBC 7.0 06/28/2022 1132   RBC 5.32 06/28/2022 1132   HGB 16.6 06/28/2022 1132   HGB 15.5 05/27/2021 1136   HGB 15.8 11/03/2020 1614   HGB 11.8 (L) 01/30/2017 1329   HCT 49.3 06/28/2022 1132   HCT 47.9 11/03/2020 1614   HCT 34.5 (L) 01/30/2017 1329   PLT 152 06/28/2022 1132   PLT 127 (L) 05/27/2021 1136   PLT 126 (L) 11/03/2020 1614   MCV 92.7 06/28/2022 1132   MCV 95 11/03/2020 1614   MCV 98.9 (H) 01/30/2017 1329   MCH 31.2 06/28/2022 1132   MCHC 33.7 06/28/2022 1132   RDW 13.6 06/28/2022 1132   RDW 15.0 11/03/2020 1614   RDW 13.9 01/30/2017 1329   LYMPHSABS 1.9 04/01/2022 1225   LYMPHSABS 1.4 11/03/2020 1614   LYMPHSABS 1.0 01/30/2017 1329   MONOABS 0.8 04/01/2022 1225   MONOABS 0.8 01/30/2017 1329   EOSABS 0.2 04/01/2022 1225   EOSABS 0.0 11/03/2020 1614   BASOSABS 0.1 04/01/2022 1225   BASOSABS 0.1 11/03/2020 1614   BASOSABS 0.0  01/30/2017 1329    CMP     Component Value Date/Time   NA 143 06/28/2022 1132   NA 139 11/03/2020 1614   NA 142 01/30/2017 1331   K 3.8 06/28/2022 1132   K 3.7 01/30/2017 1331   CL 107 06/28/2022 1132   CO2 25 06/28/2022 1132   CO2 26 01/30/2017 1331   GLUCOSE 101 (H) 06/28/2022 1132   GLUCOSE 111 01/30/2017 1331   BUN 26 (H)  06/28/2022 1132   BUN 36 (H) 11/03/2020 1614   BUN 20.0 01/30/2017 1331   CREATININE 1.27 (H) 06/28/2022 1132   CREATININE 1.30 (H) 05/27/2021 1136   CREATININE 1.2 01/30/2017 1331   CALCIUM  9.3 06/28/2022 1132   CALCIUM  9.1 01/30/2017 1331   PROT 7.1 06/28/2022 1132   PROT 6.3 11/03/2020 1614   PROT 5.6 (L) 01/30/2017 1331   ALBUMIN  4.0 06/28/2022 1132   ALBUMIN  4.3 11/03/2020 1614   ALBUMIN  2.9 (L) 01/30/2017 1331   AST 28 06/28/2022 1132   AST 37 05/27/2021 1136   AST 25 01/30/2017 1331   ALT 24 06/28/2022 1132   ALT 51 (H) 05/27/2021 1136   ALT 17 01/30/2017 1331   ALKPHOS 69 06/28/2022 1132   ALKPHOS 61 01/30/2017 1331   BILITOT 1.8 (H) 06/28/2022 1132   BILITOT 1.5 (H) 05/27/2021 1136   BILITOT 0.68 01/30/2017 1331   GFRNONAA 57 (L) 06/28/2022 1132   GFRNONAA 56 (L) 05/27/2021 1136   GFRAA 58 (L) 12/04/2019 1126    Assessment and Plan:   Elsie MALVA Deana Mickey. is a 83 y.o. y/o male   1.  GERD - Continue pantoprazole  40 Mg twice daily for 1 month. - After 1 month, he can decrease to once daily pantoprazole  40mg  daily. - Recommend Lifestyle Modifications to prevent Acid Reflux.  Rec. Avoid coffee, sodas, peppermint, garlic, onions, alcohol , citrus fruits, chocolate, tomatoes, fatty and spicey foods.  Avoid eating 2-3 hours before bedtime.    2.  Dysphagia due to distal esophageal stricture s/p EGD with dilation 10/19/2023. - Continue soft foods for a few days. - Follow-up if he has any recurrent solid food dysphagia in the future.  3.  Esophageal dysmotility - Patient education discussed. - Recommend eat small frequent meals.  Eat sitting up.  Chew food well.  Do not eat 2 to 3 hours before laying down at night. - Recommend he take pills with 6 to 8 ounces of water .  Ellouise Console, [name]  Follow up as needed if recurrent GI symptoms.

## 2023-10-19 NOTE — Discharge Instructions (Signed)

## 2023-10-19 NOTE — Anesthesia Postprocedure Evaluation (Signed)
 Anesthesia Post Note  Patient: Alex [name].  Procedure(s) Performed: EGD, WITH DILATION USING SAVARY-GILLIARD DILATOR OVER GUIDEWIRE EGD (ESOPHAGOGASTRODUODENOSCOPY)     Patient location during evaluation: PACU Anesthesia Type: MAC Level of consciousness: awake and alert Pain management: pain level controlled Vital Signs Assessment: post-procedure vital signs reviewed and stable Respiratory status: spontaneous breathing, nonlabored ventilation and respiratory function stable Cardiovascular status: blood pressure returned to baseline and stable Postop Assessment: no apparent nausea or vomiting Anesthetic complications: no   There were no known notable events for this encounter.  Last Vitals:  Vitals:   10/19/23 1010 10/19/23 1020  BP: 105/71 110/70  Pulse: 76 77  Resp: 16 15  Temp:    SpO2: 92% 98%    Last Pain:  Vitals:   10/19/23 1020  TempSrc:   PainSc: 0-No pain                 Butler Levander Pinal

## 2023-10-19 NOTE — Transfer of Care (Signed)
 Immediate Anesthesia Transfer of Care Note  Patient: Alex [name].  Procedure(s) Performed: EGD, WITH DILATION USING SAVARY-GILLIARD DILATOR OVER GUIDEWIRE EGD (ESOPHAGOGASTRODUODENOSCOPY)  Patient Location: PACU  Anesthesia Type:MAC  Level of Consciousness: awake and alert   Airway & Oxygen  Therapy: Patient Spontanous Breathing and Patient connected to nasal cannula oxygen   Post-op Assessment: Report given to RN and Post -op Vital signs reviewed and stable  Post vital signs: Reviewed and stable  Last Vitals:  Vitals Value Taken Time  BP 99/68 10/19/23 10:00  Temp 98.6   Pulse 75 10/19/23 10:01  Resp 20 10/19/23 10:01  SpO2 98 % 10/19/23 10:01  Vitals shown include unfiled device data.  Last Pain:  Vitals:   10/19/23 1000  TempSrc: Temporal  PainSc: Asleep         Complications: There were no known notable events for this encounter.

## 2023-10-19 NOTE — Interval H&P Note (Signed)
 History and Physical Interval Note:  83 year old male with Episodic solid food dysphagia, pointing to mid to lower sternum.  Has been increasing fluid intake with meals to try to facilitate fluid passage.  Does have a history of right salivary gland removal, intends to increase fluid consumption after that as well.  No history of food impactions.  Does have reflux symptoms, primarily takes OTC Gas-X and Mylanta.  Not on any PPI or H2 blocker previously, but was started on Protonix  40 mg daily last month.  Esophagram on 09/26/2023 with prominent cricopharyngeal muscle, 13 mm barium tablet significantly delayed at the level of the distal esophagus/GE junction, ultimately the passing into the stomach after 10 minutes and additional sips of water  and barium contrast and bites of a graham cracker.  Mildly patulous esophagus with moderate esophageal dysmotility and a small hiatal hernia also noted.  He presents today for EGD for endoscopic evaluation and dilation as appropriate.  Has been holding his Eliquis  for 2 days for procedure today.  Otherwise no significant changes in clinical history since last OV on 09/05/2023.  Plan to proceed with procedure at [facility] unit due to elevated periprocedural risks from underlying comorbidities.  10/19/2023 9:28 AM  Alex [name].  has presented today for surgery, with the diagnosis of dysphagia; esophageal stricture; esophageal dysmotility; HH.  The various methods of treatment have been discussed with the patient and family. After consideration of risks, benefits and other options for treatment, the patient has consented to  Procedure(s): EGD, WITH DILATION USING SAVARY-GILLIARD DILATOR OVER GUIDEWIRE (N/A) as a surgical intervention.  The patient's history has been reviewed, patient examined, no change in status, stable for surgery.  I have reviewed the patient's chart and labs.  Questions were answered to the patient's satisfaction.     Alex [name]

## 2023-10-19 NOTE — Anesthesia Preprocedure Evaluation (Signed)
 Anesthesia Evaluation  Patient identified by MRN, date of birth, ID band Patient awake    Reviewed: Allergy & Precautions, NPO status , Patient's Chart, lab work & pertinent test results  Airway Mallampati: II  TM Distance: >3 FB Neck ROM: Full    Dental  (+) Teeth Intact, Dental Advisory Given   Pulmonary shortness of breath, with exertion, at rest and Long-Term Oxygen  Therapy, sleep apnea , pneumonia, resolved, former smoker   breath sounds clear to auscultation       Cardiovascular hypertension, Pt. on medications and Pt. on home beta blockers + CAD  + Valvular Problems/Murmurs  Rhythm:Regular Rate:Normal  Echo:  1. Left ventricular ejection fraction, by estimation, is 50%. The left  ventricle has mildly decreased function. The left ventricle demonstrates  global hypokinesis. There is mild concentric left ventricular hypertrophy.  Left ventricular diastolic  parameters are consistent with Grade I diastolic dysfunction (impaired  relaxation).   2. Right ventricular systolic function is mildly reduced. The right  ventricular size is normal. There is normal pulmonary artery systolic  pressure. The estimated right ventricular systolic pressure is 14.6 mmHg.   3. The mitral valve is normal in structure. No evidence of mitral valve  regurgitation. No evidence of mitral stenosis.   4. The aortic valve is tricuspid. There is moderate calcification of the  aortic valve. Aortic valve regurgitation is mild. Moderate aortic valve  stenosis by doppler indices though visually could be severe. Aortic valve  area, by VTI measures 1.17 cm.  Aortic valve mean gradient measures 12.0 mmHg.   5. There is mild dilatation of the ascending aorta, measuring 39 mm.   6. The inferior vena cava is normal in size with greater than 50%  respiratory variability, suggesting right atrial pressure of 3 mmHg.     Neuro/Psych  Headaches, Seizures -, Well  Controlled,   Neuromuscular disease  negative psych ROS   GI/Hepatic Neg liver ROS, hiatal hernia,GERD  Medicated and Controlled,,  Endo/Other  diabetes    Renal/GU Renal disease     Musculoskeletal  (+) Arthritis ,    Abdominal   Peds  Hematology   Anesthesia Other Findings   Reproductive/Obstetrics                             Anesthesia Physical Anesthesia Plan  ASA: 3  Anesthesia Plan: MAC   Post-op Pain Management: Minimal or no pain anticipated   Induction: Intravenous  PONV Risk Score and Plan: 1 and Treatment may vary due to age or medical condition and Propofol  infusion  Airway Management Planned: Nasal Cannula  Additional Equipment: None  Intra-op Plan:   Post-operative Plan:   Informed Consent: I have reviewed the patients History and Physical, chart, labs and discussed the procedure including the risks, benefits and alternatives for the proposed anesthesia with the patient or authorized representative who has indicated his/her understanding and acceptance.     Dental advisory given  Plan Discussed with: CRNA  Anesthesia Plan Comments: (See PAT note 07/06/2022)       Anesthesia Quick Evaluation

## 2023-10-20 ENCOUNTER — Ambulatory Visit: Admitting: Physician Assistant

## 2023-10-20 ENCOUNTER — Encounter: Payer: Self-pay | Admitting: Physician Assistant

## 2023-10-20 VITALS — BP 120/62 | HR 68 | Ht 71.0 in | Wt 185.0 lb

## 2023-10-20 DIAGNOSIS — K219 Gastro-esophageal reflux disease without esophagitis: Secondary | ICD-10-CM | POA: Diagnosis not present

## 2023-10-20 DIAGNOSIS — K222 Esophageal obstruction: Secondary | ICD-10-CM

## 2023-10-20 NOTE — Progress Notes (Signed)
 Noted

## 2023-10-20 NOTE — Patient Instructions (Signed)
 Please follow up as needed if symptoms increase or worsen  Due to recent changes in healthcare laws, you may see the results of your imaging and laboratory studies on MyChart before your provider has had a chance to review them.  We understand that in some cases there may be results that are confusing or concerning to you. Not all laboratory results come back in the same time frame and the provider may be waiting for multiple results in order to interpret others.  Please give us  48 hours in order for your provider to thoroughly review all the results before contacting the office for clarification of your results.   Thank you for trusting me with your gastrointestinal care!   Ellouise Console, [name] _______________________________________________________  If your blood pressure at your visit was 140/90 or greater, please contact your primary care physician to follow up on this.  _______________________________________________________  If you are age 81 or older, your body mass index should be between 23-30. Your Body mass index is 25.8 kg/m. If this is out of the aforementioned range listed, please consider follow up with your Primary Care Provider.  If you are age 81 or younger, your body mass index should be between 19-25. Your Body mass index is 25.8 kg/m. If this is out of the aformentioned range listed, please consider follow up with your Primary Care Provider.   ________________________________________________________  The [facility] providers would like to encourage you to use MYCHART to communicate with providers for non-urgent requests or questions.  Due to long hold times on the telephone, sending your provider a message by [facility] may be a faster and more efficient way to get a response.  Please allow 48 business hours for a response.  Please remember that this is for non-urgent requests.  _______________________________________________________

## 2023-10-21 ENCOUNTER — Encounter (HOSPITAL_COMMUNITY): Payer: Self-pay | Admitting: Gastroenterology

## 2023-10-23 LAB — SURGICAL PATHOLOGY

## 2023-10-24 ENCOUNTER — Ambulatory Visit: Payer: Self-pay | Admitting: Gastroenterology

## 2023-11-09 ENCOUNTER — Ambulatory Visit: Admitting: Pulmonary Disease

## 2023-11-28 ENCOUNTER — Ambulatory Visit

## 2023-11-28 VITALS — BP 136/72 | HR 78 | Temp 97.8°F | Ht 71.0 in | Wt 185.6 lb

## 2023-11-28 DIAGNOSIS — G4733 Obstructive sleep apnea (adult) (pediatric): Secondary | ICD-10-CM | POA: Diagnosis not present

## 2023-11-28 DIAGNOSIS — E8581 Light chain (AL) amyloidosis: Secondary | ICD-10-CM | POA: Diagnosis not present

## 2023-11-28 DIAGNOSIS — J8410 Pulmonary fibrosis, unspecified: Secondary | ICD-10-CM

## 2023-11-28 NOTE — Patient Instructions (Signed)
 Referral to pulmonary rehab; order placed.  Follow up Chest CT ordered.  Follow up in 4 months.  Continue Albuterol /Combivent as needed.  Exercise as tolerated encouraged to promote improved lung function.

## 2023-11-28 NOTE — Progress Notes (Signed)
 @Patient  ID: Alex MALVA Deana Mickey., male    DOB: 11-Jun-1940, 83 y.o.   MRN: [id number]  Chief Complaint  Patient presents with   Follow-up    [facility].  Pt states he gets SOB both at rest and exertion. Pt states he has 40% lung capacity.       Referring provider: Loreli Alex JONETTA Mickey., MD  HPI: Alex [name] is a 83 year old male, former smoker with AL amyloidosis with nephrotic syndrome, hypertension, moderate nonobstructive CAD by cath 2011, aortic stenosis, GERD, hyperlipidemia, and carotid artery disease who presents for follow up of post COVID pulmonary fibrosis.  He was last seen in the pulmonary clinic on 04/26/2022. His last Chest CT was completed in December 2023.  His wife is present and helpful with the history.  He reports that he has continues to have issues with dyspnea associated with activities.  He rarely uses his inhalers, and does not feel benefit from them.  His wife reports that he has become less active as a result of his dyspnea.  He denies cough, chest pain, fever, chills, night sweats, weight loss, dizziness, or syncope.  Ambulatory O2 sat nadir in office today of 94%.   TEST/EVENTS : EGD 10/2023 with benign polyp; Cardiology follow up 06/2023- no changes.  Negative stress test at [facility] in 2022.  Allergies  Allergen Reactions   Ramipril Cough    Other reaction(s): Cough (ALLERGY/intolerance)   Augmentin [Amoxicillin-Pot Clavulanate] Diarrhea    Immunization History  Administered Date(s) Administered   DTaP / IPV 10/18/2017, 01/10/2018, 04/11/2018   HIB (PRP-OMP) 10/18/2017, 01/10/2018, 04/11/2018   Hepatitis B 10/18/2017, 01/10/2018, 06/05/2018   Influenza, High Dose Seasonal PF 06/05/2018, 04/03/2020   Influenza, Quadrivalent, Recombinant, Inj, Pf 01/04/2019, 01/14/2020, 01/15/2021   MMR 04/12/2019   Moderna Sars-Covid-2 Vaccination 05/20/2019, 06/17/2019   Pneumococcal Conjugate-13 10/18/2017, 01/10/2018, 04/11/2018    Pneumococcal Polysaccharide-23 03/21/2008, 06/05/2018, 01/27/2020    Past Medical History:  Diagnosis Date   AL amyloidosis [facility]) oncologist-- dr onesimo   Renal and Systemic , dx 04/ 2018;  renal bx 08-05-2016 and bone marrow bx 08-29-2016;  8 month chemotherapy then stell cell transplant 12/ 2018 then chemo again;  since then been on oral chemo daily (11-07-2019 per pt has been on hold until after prostate seed implants)   Arthritis    Atypical chest pain    CAD (coronary artery disease) cardiologist-- dr verlin;  [facility] [place] dr d. sarah   moderate nonobstrucive disease by cath 2011; Lexiscan  Myoview  6/14--Normal study, no scar or ischemia, EF 62%;  11-14-2014 nuclear study low risk w/ normal perfusion, nuclear ef 67%   CKD (chronic kidney disease), stage III (HCC)    COVID    b/l PNA (7/20)- 21 day hospitalization tx with steroids, Remdesevir, Actemra , Hydroxychloroquine ) --> PE on Eliquis .   Diverticulosis of colon    Dyspnea    WITH EXERTION    ED (erectile dysfunction)    Fatty liver    GERD (gastroesophageal reflux disease)    H/O stem cell transplant (HCC) 03/2017   Headache    Heart murmur    Hiatal hernia    History of 2019 novel coronavirus disease (COVID-19) 09/2018   [facility], hospital admission 10-28-2019 respiratory failure/ pneumometrostinom/ PE ;  was not intubated , treatment with remdesivir / steroids/ actemra / plasma   History of basal cell carcinoma (BCC)    History  of colon polyps    History of MRSA infection    culture positive   History of pulmonary embolus (PE) 10/2018   with covid 19, completed treatment with eliquis    History of seizures    11-07-2019  per pt last one 1970s, unknown cause   History of vertebral compression fracture 1988   lumbar   Hx of adenomatous colonic polyps    negative in 7/08   Hyperlipidemia    Hypertension    IFG (impaired fasting glucose)    Neuropathy due  to chemotherapeutic drug (HCC)    fingers tips and feet   Nocturia    On supplemental oxygen  therapy    post covid 19;   11-07-2019 pt uses O2 every night 2L via [place],  and uses supplements during the day when walking around neighborhood and goes out to appointments/ store that he has to walk a distance   OSA (obstructive sleep apnea)    study in epic 03-18-2005 mild to moderate   (11-07-2019  per pt used cpap up until when he lost alot of weight, stoped approx. 2012   Pneumonia    Prostate cancer [facility]) urologist--- dr pace/  oncology-- dr patrcia   dx 03/ 2021   Pulmonary fibrosis (HCC) 09/2018   pulmonologist-- dr theophilus;   post covid 19   Seizure (HCC)    1st in 1976; last in 1978 GTC x 2    SOB (shortness of breath) on exertion    11-07-2019  per pt walks around neighborhood for 30 minutes twice daily, he does have to stop frequently d/t sob and since weather has been hot he uses his supplemental oxygen ;  stated he has a pulse ox. at home , when he is sitting down O2 sat 96 -97% on RA when up walking around the house O2 sat 93-94% on RA;  pt stated he carry's his O2 w/ him when he goes to doctor appts/store d/t distance   Wears glasses    Wears hearing aid in both ears     Tobacco History: Social History   Tobacco Use  Smoking Status Former   Current packs/day: 0.00   Average packs/day: 3.0 packs/day for 20.0 years (60.0 ttl pk-yrs)   Types: Cigarettes   Start date: 04/25/1949   Quit date: 04/25/1969   Years since quitting: 54.6  Smokeless Tobacco Former   Types: Chew   Quit date: 11/07/1983   Counseling given: Not Answered   Outpatient Medications Prior to Visit  Medication Sig Dispense Refill   albuterol  (ACCUNEB ) 1.25 MG/3ML nebulizer solution Take 1 ampule by nebulization every 6 (six) hours as needed for shortness of breath or wheezing.     apixaban  (ELIQUIS ) 5 MG TABS tablet Take 5 mg by mouth 2 (two) times daily.     Ascorbic Acid  (VITAMIN C ) 1000 MG tablet Take 1,000 mg  by mouth daily.     atorvastatin  (LIPITOR) 40 MG tablet Take 40 mg by mouth daily.     capsaicin (ZOSTRIX) 0.025 % cream Apply 1 Application topically as needed (aches and pains).     carvedilol  (COREG ) 6.25 MG tablet Take 3.125-6.25 mg by mouth See admin instructions. 6.25 mg in the morning, 3.125 mg in the evening     Cholecalciferol  125 MCG (5000 UT) TABS Take 5,000 Units by mouth 2 (two) times a day.     diclofenac Sodium (VOLTAREN) 1 % GEL Apply topically.     ezetimibe  (ZETIA ) 10 MG tablet Take 10 mg by mouth  daily.     fluticasone  (FLONASE ) 50 MCG/ACT nasal spray Place 2 sprays into both nostrils daily. 16 g 5   furosemide  (LASIX ) 40 MG tablet Take 40 mg by mouth daily.      Ipratropium-Albuterol  (COMBIVENT RESPIMAT) 20-100 MCG/ACT AERS respimat Inhale 1 puff into the lungs every 6 (six) hours as needed for wheezing or shortness of breath.     isosorbide  mononitrate (IMDUR ) 30 MG 24 hr tablet Take 30 mg by mouth daily.     lidocaine  (LIDODERM ) 5 % Place 1 patch onto the skin daily as needed (pain).     Multiple Vitamin (MULTI-VITAMINS) TABS Take 1 tablet by mouth at bedtime.     mupirocin  ointment (BACTROBAN ) 2 % Apply 1 Application topically daily as needed (wound care).     nitroGLYCERIN  (NITROSTAT ) 0.4 MG SL tablet DISSOLVE ONE TABLET UNDER TONGUE EVERY 5 MINUTES AS NEEDED UP TO 3 DOSES, IF NORELIEF CALL 911 25 tablet 11   pantoprazole  (PROTONIX ) 40 MG tablet Take 1 tablet (40 mg total) by mouth 2 (two) times daily. 180 tablet 1   potassium chloride  SA (KLOR-CON ) 20 MEQ tablet Take 20 mEq by mouth daily.     Probiotic Product (PROBIOTIC-10 PO) Take 1 capsule by mouth daily.     tamsulosin  (FLOMAX ) 0.4 MG CAPS capsule Take 0.4 mg by mouth 2 (two) times daily.     triamcinolone  cream (KENALOG ) 0.1 % Apply 1 Application topically daily as needed (irritation).     vitamin B-12 (CYANOCOBALAMIN) 250 MCG tablet Take 500 mcg by mouth daily.     zinc gluconate 50 MG tablet Take 50 mg by mouth  daily.     No facility-administered medications prior to visit.     Review of Systems:   Constitutional:   No  weight loss, night sweats,  Fevers, chills, fatigue, or  lassitude.  HEENT:   No headaches,  Difficulty swallowing,  Tooth/dental problems, or  Sore throat,                No sneezing, itching, ear ache, nasal congestion, post nasal drip,   CV:  No chest pain,  Orthopnea, PND, swelling in lower extremities, anasarca, dizziness, palpitations, syncope.   GI  No heartburn, indigestion, abdominal pain, nausea, vomiting, diarrhea, change in bowel habits, loss of appetite, bloody stools.   Resp: No shortness of breath at rest.  No excess mucus, no productive cough,  No non-productive cough,  No coughing up of blood.  No change in color of mucus.  No wheezing.  No chest wall deformity.  ++dyspnea on exertion  Skin: no rash or lesions.  GU: no dysuria, change in color of urine, no urgency or frequency.  No flank pain, no hematuria   MS:  No joint pain or swelling.  No decreased range of motion.  No back pain.    Physical Exam  BP 136/72 (BP Location: Left Arm, Patient Position: Sitting, Cuff Size: Normal)   Pulse 78   Temp 97.8 F (36.6 C) (Oral)   Ht 5' 11 (1.803 m)   Wt 185 lb 9.6 oz (84.2 kg)   SpO2 96%   BMI 25.89 kg/m   GEN: A/Ox3; pleasant , NAD, well nourished    HEENT:  [place]/AT,  EACs-clear, TMs-wnl, NOSE-clear, THROAT-clear, no lesions, no postnasal drip or exudate noted.   NECK:  Supple w/ fair ROM; no JVD; normal carotid impulses w/o bruits; no thyromegaly or nodules palpated; no lymphadenopathy.    RESP  Clear  P &  A; w/o, wheezes or rhonchi. no accessory muscle use, no dullness to percussion.  Few crackles at bases with fairly good aeration throughout  CARD:  RRR, 2/6 SEM no peripheral edema, pulses intact, no cyanosis or clubbing.  GI:   Soft & nt; nml bowel sounds; no organomegaly or masses detected.   Musco: Warm bil, no deformities or joint swelling  noted.   Neuro: alert, no focal deficits noted.    Skin: Warm, no lesions or rashes    Lab Results:  CBC    Component Value Date/Time   WBC 7.0 06/28/2022 1132   RBC 5.32 06/28/2022 1132   HGB 16.6 06/28/2022 1132   HGB 15.5 05/27/2021 1136   HGB 15.8 11/03/2020 1614   HGB 11.8 (L) 01/30/2017 1329   HCT 49.3 06/28/2022 1132   HCT 47.9 11/03/2020 1614   HCT 34.5 (L) 01/30/2017 1329   PLT 152 06/28/2022 1132   PLT 127 (L) 05/27/2021 1136   PLT 126 (L) 11/03/2020 1614   MCV 92.7 06/28/2022 1132   MCV 95 11/03/2020 1614   MCV 98.9 (H) 01/30/2017 1329   MCH 31.2 06/28/2022 1132   MCHC 33.7 06/28/2022 1132   RDW 13.6 06/28/2022 1132   RDW 15.0 11/03/2020 1614   RDW 13.9 01/30/2017 1329   LYMPHSABS 1.9 04/01/2022 1225   LYMPHSABS 1.4 11/03/2020 1614   LYMPHSABS 1.0 01/30/2017 1329   MONOABS 0.8 04/01/2022 1225   MONOABS 0.8 01/30/2017 1329   EOSABS 0.2 04/01/2022 1225   EOSABS 0.0 11/03/2020 1614   BASOSABS 0.1 04/01/2022 1225   BASOSABS 0.1 11/03/2020 1614   BASOSABS 0.0 01/30/2017 1329    BMET    Component Value Date/Time   NA 143 06/28/2022 1132   NA 139 11/03/2020 1614   NA 142 01/30/2017 1331   K 3.8 06/28/2022 1132   K 3.7 01/30/2017 1331   CL 107 06/28/2022 1132   CO2 25 06/28/2022 1132   CO2 26 01/30/2017 1331   GLUCOSE 101 (H) 06/28/2022 1132   GLUCOSE 111 01/30/2017 1331   BUN 26 (H) 06/28/2022 1132   BUN 36 (H) 11/03/2020 1614   BUN 20.0 01/30/2017 1331   CREATININE 1.27 (H) 06/28/2022 1132   CREATININE 1.30 (H) 05/27/2021 1136   CREATININE 1.2 01/30/2017 1331   CALCIUM  9.3 06/28/2022 1132   CALCIUM  9.1 01/30/2017 1331   GFRNONAA 57 (L) 06/28/2022 1132   GFRNONAA 56 (L) 05/27/2021 1136   GFRAA 58 (L) 12/04/2019 1126    BNP    Component Value Date/Time   BNP 37.7 04/22/2018 0300    ProBNP No results found for: PROBNP  Imaging: No results found.  Administration History     None          Latest Ref Rng & Units 01/31/2020    11:59 AM 11/01/2019    9:47 AM 04/03/2019   10:50 AM  PFT Results  FVC-Pre L 3.15  3.19  3.14   FVC-Predicted Pre % 77  77  76   FVC-Post L 3.11  3.23  3.11   FVC-Predicted Post % 76  78  75   Pre FEV1/FVC % % 84  86  87   Post FEV1/FCV % % 84  87  87   FEV1-Pre L 2.64  2.74  2.73   FEV1-Predicted Pre % 90  92  91   FEV1-Post L 2.62  2.82  2.69   DLCO uncorrected ml/min/mmHg 10.40  15.03  10.94   DLCO UNC% % 42  61  44   DLCO corrected ml/min/mmHg  14.94    DLCO COR %Predicted %  60    DLVA Predicted % 62  81  67   TLC L 4.41  5.22  4.29   TLC % Predicted % 63  75  61   RV % Predicted % 41  56  48     No results found for: NITRICOXIDE   Assessment & Plan:  Alex [name] is a 83 year old male, former smoker with AL amyloidosis with nephrotic syndrome, hypertension, moderate nonobstructive CAD by cath 2011, aortic stenosis, GERD, hyperlipidemia, and carotid artery disease who returns to pulmonary clinic for follow up. HRCT has demonstrated post-covid pulmonary fibrosis; he reports progressive symptoms but did not qualify for oxygen  via walk test done in clinic today. Dyspnea likely multifactorial in the setting of CAD, afib, aortic stenosis, post-covid pulmonary fibrosis, and deconditioning. Assessment & Plan Pulmonary fibrosis (HCC) -  Check follow up CT to determine progression/stability of disease -  Referral to pulmonary rehab to help maximize ADL capacity -  Continue Albuterol /Combivent prn; last PFTs in 2021 with restrictive d/o and minimal response to bronchodilators- patient refused repeat -  Continue CPAP with supplemental oxygen  -  Infrarenal aneurysm:  obs only per Vascular -  Follows with GI for GERD, hepatic steatosis  OSA (obstructive sleep apnea)  AL amyloidosis (HCC)    Return in about 4 months (around 03/29/2024) for CT results.  Candis Dandy, [name] 11/28/2023

## 2023-11-29 ENCOUNTER — Telehealth: Payer: Self-pay

## 2023-11-29 DIAGNOSIS — J8410 Pulmonary fibrosis, unspecified: Secondary | ICD-10-CM

## 2023-11-29 DIAGNOSIS — J849 Interstitial pulmonary disease, unspecified: Secondary | ICD-10-CM

## 2023-11-29 NOTE — Telephone Encounter (Signed)
**Note De-identified  [place] Obfuscation** Please advise 

## 2023-11-29 NOTE — Telephone Encounter (Signed)
 Copied from CRM [id number]. Topic: Clinical - Request for Lab/Test Order >> Nov 28, 2023  1:08 PM Celestine FALCON wrote: Reason for CRM: Pt stated he saw Candis Dandy [name] on 11/28/2023 for an ov, and she put in a referral for [id number] - AMB REFERRAL TO PULMONARY REHABILITATION  At [facility]. Pt stated that's too far away as that is roughly 50 miles for the pt. Pt would like the referral to be placed for a closer location to him.  Pt's phone number is [phone] ok to leave a vm.

## 2023-11-29 NOTE — Telephone Encounter (Signed)
 Bernett Saturnino NOVAK, RN  Carolee Aloysius LENORA Charley Candis, [name] Thank you for the referral for pulmonary rehab.  Pulmonary rehab referrals must have co signature from the referring MD Dr. Kara.  This is an Tax adviser. Will hold on scheduling until signed. We are looking forward to working with him again! Saturnino Bernett PEAK, BSN Cardiac and Emergency planning/management officer

## 2023-11-30 NOTE — Addendum Note (Signed)
 Addended by: CLAUDENE NEVINS A on: 11/30/2023 08:10 PM   Modules accepted: Orders

## 2023-12-01 ENCOUNTER — Telehealth (HOSPITAL_COMMUNITY): Payer: Self-pay

## 2023-12-01 NOTE — Telephone Encounter (Signed)
 Pt insurance is active and benefits verified through [facility]. Co-pay $25, DED $0/$0 met, out of pocket $9,350/$1,050 met, co-insurance 0%. No pre-authorization required. 12/01/2023 @ 1:48pm, spoke with [employer], REF# [id number].

## 2023-12-01 NOTE — Telephone Encounter (Signed)
 Can you all please advise

## 2023-12-02 NOTE — Telephone Encounter (Signed)
 Order placed for respiratory care services for ILD.

## 2023-12-02 NOTE — Addendum Note (Signed)
 Addended by: Aaralynn Shepheard on: 12/02/2023 08:48 AM   Modules accepted: Orders

## 2023-12-04 ENCOUNTER — Telehealth (HOSPITAL_COMMUNITY): Payer: Self-pay

## 2023-12-04 NOTE — Telephone Encounter (Signed)
 It appears Dr. Meade signed order while I was on vacation.  Thanks, JD

## 2023-12-04 NOTE — Telephone Encounter (Signed)
 Patient called to get scheduled in the Pulmonary Rehab Program. Patient will come in for orientation on 8/27 and will attend the 10:15 exercise class.  Pensions consultant.

## 2023-12-19 ENCOUNTER — Telehealth (HOSPITAL_COMMUNITY): Payer: Self-pay

## 2023-12-19 NOTE — Telephone Encounter (Signed)
 Called pt confirm his PR appointment for 12/20/23. No answer. Left VM.

## 2023-12-20 ENCOUNTER — Encounter (HOSPITAL_COMMUNITY)
Admission: RE | Admit: 2023-12-20 | Discharge: 2023-12-20 | Disposition: A | Discharge status: Home / Self Care | Source: Ambulatory Visit | Attending: Pulmonary Disease | Admitting: Pulmonary Disease

## 2023-12-20 ENCOUNTER — Encounter (HOSPITAL_COMMUNITY): Payer: Self-pay

## 2023-12-20 VITALS — BP 108/60 | HR 75 | Wt 185.8 lb

## 2023-12-20 DIAGNOSIS — J8410 Pulmonary fibrosis, unspecified: Secondary | ICD-10-CM | POA: Insufficient documentation

## 2023-12-20 NOTE — Progress Notes (Signed)
 Alex [name]. 83 y.o. male Pulmonary Rehab Orientation Note This patient who was referred to Pulmonary Rehab by Dr. Kara with the diagnosis of Pulmonary fibrosis arrived today in Cardiac and Pulmonary Rehab. He arrived ambulatory with normal gait. He does carry portable oxygen . [employer] is the provider for their DME. Per patient, Alex [name] uses oxygen  occasionally, at night when going to sleep. Color good, skin warm and dry. Patient is oriented to time and place. Patient's medical history, psychosocial health, and medications reviewed. Psychosocial assessment reveals patient lives with spouse. Alex [name] is currently retired. Patient hobbies include watching tv and spending time with others. Patient reports his stress level is low. Areas of stress/anxiety include health. Patient does not exhibit signs of depression. PHQ2/9 score 0/3. Alex [name] shows good  coping skills with positive outlook on life. Offered emotional support and reassurance. Will continue to monitor. Physical assessment performed by Nurse pick: Alex Levin RN. Please see their orientation physical assessment note. Alex [name] reports he  does take medications as prescribed. Patient states he  follows a regular  diet. The patient reports he has been trying to gain weight by adding supplements like ensure to his diet. Patient's weight will be monitored closely. Demonstration and practice of PLB using pulse oximeter. Alex [name] able to return demonstration satisfactorily. Safety and hand hygiene in the exercise area reviewed with patient. Alex [name] voices understanding of the information reviewed. Department expectations discussed with patient and achievable goals were set. The patient shows enthusiasm about attending the program and we look forward to working with Alex [name] Alex completed a 6 min walk test today and is scheduled to begin exercise on 12/28/23.   [id number] Alex [name] Alex [name], BSRT

## 2023-12-20 NOTE — Progress Notes (Signed)
 Pulmonary Individual Treatment Plan  Patient Details  Name: Alex [name]. MRN: [id number] Date of Birth: Jul 16, 1940 Referring Provider:   Flowsheet Row Pulmonary Rehab Walk Test from 12/20/2023 in [facility]  Referring Provider Dr. Kara    Initial Encounter Date:  Flowsheet Row Pulmonary Rehab Walk Test from 12/20/2023 in [facility]  Date 12/20/23    Visit Diagnosis: Pulmonary fibrosis (HCC)  Patient's Home Medications on Admission:   Current Outpatient Medications:    albuterol  (ACCUNEB ) 1.25 MG/3ML nebulizer solution, Take 1 ampule by nebulization every 6 (six) hours as needed for shortness of breath or wheezing., Disp: , Rfl:    apixaban  (ELIQUIS ) 5 MG TABS tablet, Take 5 mg by mouth 2 (two) times daily., Disp: , Rfl:    Ascorbic Acid  (VITAMIN C ) 1000 MG tablet, Take 1,000 mg by mouth daily., Disp: , Rfl:    atorvastatin  (LIPITOR) 40 MG tablet, Take 40 mg by mouth daily., Disp: , Rfl:    capsaicin (ZOSTRIX) 0.025 % cream, Apply 1 Application topically as needed (aches and pains)., Disp: , Rfl:    carvedilol  (COREG ) 6.25 MG tablet, Take 3.125-6.25 mg by mouth See admin instructions. 6.25 mg in the morning, 3.125 mg in the evening, Disp: , Rfl:    Cholecalciferol  125 MCG (5000 UT) TABS, Take 5,000 Units by mouth 2 (two) times a day., Disp: , Rfl:    diclofenac Sodium (VOLTAREN) 1 % GEL, Apply topically., Disp: , Rfl:    ezetimibe  (ZETIA ) 10 MG tablet, Take 10 mg by mouth daily., Disp: , Rfl:    fluticasone  (FLONASE ) 50 MCG/ACT nasal spray, Place 2 sprays into both nostrils daily., Disp: 16 g, Rfl: 5   furosemide  (LASIX ) 40 MG tablet, Take 40 mg by mouth daily. , Disp: , Rfl:    Ipratropium-Albuterol  (COMBIVENT RESPIMAT) 20-100 MCG/ACT AERS respimat, Inhale 1 puff into the lungs every 6 (six) hours as needed for wheezing or shortness of breath., Disp: , Rfl:     isosorbide  mononitrate (IMDUR ) 30 MG 24 hr tablet, Take 30 mg by mouth daily., Disp: , Rfl:    lidocaine  (LIDODERM ) 5 %, Place 1 patch onto the skin daily as needed (pain)., Disp: , Rfl:    Multiple Vitamin (MULTI-VITAMINS) TABS, Take 1 tablet by mouth at bedtime., Disp: , Rfl:    mupirocin  ointment (BACTROBAN ) 2 %, Apply 1 Application topically daily as needed (wound care)., Disp: , Rfl:    nitroGLYCERIN  (NITROSTAT ) 0.4 MG SL tablet, DISSOLVE ONE TABLET UNDER TONGUE EVERY 5 MINUTES AS NEEDED UP TO 3 DOSES, IF NORELIEF CALL 911, Disp: 25 tablet, Rfl: 11   pantoprazole  (PROTONIX ) 40 MG tablet, Take 1 tablet (40 mg total) by mouth 2 (two) times daily., Disp: 180 tablet, Rfl: 1   potassium chloride  SA (KLOR-CON ) 20 MEQ tablet, Take 20 mEq by mouth daily., Disp: , Rfl:    Probiotic Product (PROBIOTIC-10 PO), Take 1 capsule by mouth daily., Disp: , Rfl:    tamsulosin  (FLOMAX ) 0.4 MG CAPS capsule, Take 0.4 mg by mouth 2 (two) times daily., Disp: , Rfl:    triamcinolone  cream (KENALOG ) 0.1 %, Apply 1 Application topically daily as needed (irritation)., Disp: , Rfl:    vitamin B-12 (CYANOCOBALAMIN) 250 MCG tablet, Take 500 mcg by mouth daily., Disp: , Rfl:    zinc gluconate 50 MG tablet, Take 50 mg by mouth daily., Disp: , Rfl:   Past  Medical History: Past Medical History:  Diagnosis Date   AL amyloidosis [facility]) oncologist-- dr onesimo   Renal and Systemic , dx 04/ 2018;  renal bx 08-05-2016 and bone marrow bx 08-29-2016;  8 month chemotherapy then stell cell transplant 12/ 2018 then chemo again;  since then been on oral chemo daily (11-07-2019 per pt has been on hold until after prostate seed implants)   Arthritis    Atypical chest pain    CAD (coronary artery disease) cardiologist-- dr verlin;  also followed [facility] in [place], dr d. sarah   moderate nonobstrucive disease by cath 2011; Lexiscan  Myoview  6/14--Normal study, no scar or ischemia, EF 62%;  11-14-2014 nuclear study low  risk w/ normal perfusion, nuclear ef 67%   CKD (chronic kidney disease), stage III (HCC)    COVID    b/l PNA (7/20)- 21 day hospitalization tx with steroids, Remdesevir, Actemra , Hydroxychloroquine ) --> PE on Eliquis .   Diverticulosis of colon    Dyspnea    WITH EXERTION    ED (erectile dysfunction)    Fatty liver    GERD (gastroesophageal reflux disease)    H/O stem cell transplant (HCC) 03/2017   Headache    Heart murmur    Hiatal hernia    History of 2019 novel coronavirus disease (COVID-19) 09/2018   [facility] covid test [facility] 06/ 2020, hospital admission 10-28-2019 respiratory failure/ pneumometrostinom/ PE ;  was not intubated , treatment with remdesivir / steroids/ actemra / plasma   History of basal cell carcinoma (BCC)    History of colon polyps    History of MRSA infection    culture positive   History of pulmonary embolus (PE) 10/2018   with covid 19, completed treatment with eliquis    History of seizures    11-07-2019  per pt last one 1970s, unknown cause   History of vertebral compression fracture 1988   lumbar   Hx of adenomatous colonic polyps    negative in 7/08   Hyperlipidemia    Hypertension    IFG (impaired fasting glucose)    Neuropathy due to chemotherapeutic drug (HCC)    fingers tips and feet   Nocturia    On supplemental oxygen  therapy    post covid 19;   11-07-2019 pt uses O2 every night 2L via [place],  and uses supplements during the day when walking around neighborhood and goes out to appointments/ store that he has to walk a distance   OSA (obstructive sleep apnea)    study in epic 03-18-2005 mild to moderate   (11-07-2019  per pt used cpap up until when he lost alot of weight, stoped approx. 2012   Pneumonia    Prostate cancer [facility]) urologist--- dr pace/  oncology-- dr patrcia   dx 03/ 2021   Pulmonary fibrosis (HCC) 09/2018   pulmonologist-- dr theophilus;   post covid 19   Seizure (HCC)    1st in 1976; last in 1978 GTC x 2    SOB (shortness of breath)  on exertion    11-07-2019  per pt walks around neighborhood for 30 minutes twice daily, he does have to stop frequently d/t sob and since weather has been hot he uses his supplemental oxygen ;  stated he has a pulse ox. at home , when he is sitting down O2 sat 96 -97% on RA when up walking around the house O2 sat 93-94% on RA;  pt stated he carry's his O2 w/ him when he goes to doctor appts/store d/t  distance   Wears glasses    Wears hearing aid in both ears     Tobacco Use: Social History   Tobacco Use  Smoking Status Former   Current packs/day: 0.00   Average packs/day: 3.0 packs/day for 20.0 years (60.0 ttl pk-yrs)   Types: Cigarettes   Start date: 04/25/1949   Quit date: 04/25/1969   Years since quitting: 54.6  Smokeless Tobacco Former   Types: Chew   Quit date: 11/07/1983    Labs: Review Flowsheet  More data exists      Latest Ref Rng & Units 10/29/2018 11/08/2018 09/24/2020 11/03/2020 06/28/2022  Labs for ITP Cardiac and Pulmonary Rehab  Trlycerides <150 mg/dL 896  - - - -  Hemoglobin A1c 4.8 - 5.6 % - - 6.0  6.5  5.5   PH, Arterial 7.350 - 7.450 - 7.528  - - -  PCO2 arterial 32.0 - 48.0 mmHg - 27.2  - - -  Bicarbonate 20.0 - 28.0 mmol/L - 22.7  - - -  TCO2 22 - 32 mmol/L - 24  - - -  O2 Saturation % - 90.0  - - -    Capillary Blood Glucose: Lab Results  Component Value Date   GLUCAP 119 (H) 06/28/2022   GLUCAP 113 (H) 11/09/2018   GLUCAP 143 (H) 11/08/2018   GLUCAP 115 (H) 11/08/2018   GLUCAP 153 (H) 11/08/2018     Pulmonary Assessment Scores:  Pulmonary Assessment Scores     Row Name 12/20/23 1439         ADL UCSD   ADL Phase Entry     SOB Score total 84       CAT Score   CAT Score 31       mMRC Score   mMRC Score 4       UCSD: Self-administered rating of dyspnea associated with activities of daily living (ADLs) 6-point scale (0 = not at all to 5 = maximal or unable to do because of breathlessness)  Scoring Scores range from 0 to 120.  Minimally  important difference is 5 units  CAT: CAT can identify the health impairment of COPD patients and is better correlated with disease progression.  CAT has a scoring range of zero to 40. The CAT score is classified into four groups of low (less than 10), medium (10 - 20), high (21-30) and very high (31-40) based on the impact level of disease on health status. A CAT score over 10 suggests significant symptoms.  A worsening CAT score could be explained by an exacerbation, poor medication adherence, poor inhaler technique, or progression of COPD or comorbid conditions.  CAT MCID is 2 points  mMRC: mMRC (Modified Medical Research Council) Dyspnea Scale is used to assess the degree of baseline functional disability in patients of respiratory disease due to dyspnea. No minimal important difference is established. A decrease in score of 1 point or greater is considered a positive change.   Pulmonary Function Assessment:  Pulmonary Function Assessment - 12/20/23 1358       Breath   Bilateral Breath Sounds Decreased    Shortness of Breath Yes;Limiting activity          Exercise Target Goals: Exercise Program Goal: Individual exercise prescription set using results from initial 6 min walk test and THRR while considering  patient's activity barriers and safety.   Exercise Prescription Goal: Initial exercise prescription builds to 30-45 minutes a day of aerobic activity, 2-3 days per week.  Home exercise guidelines  will be given to patient during program as part of exercise prescription that the participant will acknowledge.  Activity Barriers & Risk Stratification:  Activity Barriers & Cardiac Risk Stratification - 12/20/23 1353       Activity Barriers & Cardiac Risk Stratification   Activity Barriers Deconditioning;Muscular Weakness;Shortness of Breath;Balance Concerns    Cardiac Risk Stratification Moderate          6 Minute Walk:  6 Minute Walk     Row Name 12/20/23 1445          6 Minute Walk   Phase Initial     Distance 629 feet     Walk Time 6 minutes     # of Rest Breaks 1  4:31-5:13     MPH 1.19     METS 1.08     RPE 15     Perceived Dyspnea  4     VO2 Peak 3.79     Symptoms No     Resting HR 75 bpm     Resting BP 108/60     Resting Oxygen  Saturation  95 %     Exercise Oxygen  Saturation  during 6 min walk 94 %     Max Ex. HR 75 bpm     Max Ex. BP 126/70     2 Minute Post BP 120/70       Interval HR   1 Minute HR 73     2 Minute HR 74     3 Minute HR 71     4 Minute HR 71     5 Minute HR 72     6 Minute HR 66     2 Minute Post HR 62     Interval Heart Rate? Yes       Interval Oxygen    Interval Oxygen ? Yes     Baseline Oxygen  Saturation % 95 %     1 Minute Oxygen  Saturation % 96 %     1 Minute Liters of Oxygen  0 L     2 Minute Oxygen  Saturation % 96 %     2 Minute Liters of Oxygen  0 L     3 Minute Oxygen  Saturation % 95 %     3 Minute Liters of Oxygen  0 L     4 Minute Oxygen  Saturation % 95 %     4 Minute Liters of Oxygen  0 L     5 Minute Oxygen  Saturation % 94 %     5 Minute Liters of Oxygen  0 L     6 Minute Oxygen  Saturation % 95 %     6 Minute Liters of Oxygen  0 L     2 Minute Post Oxygen  Saturation % 95 %     2 Minute Post Liters of Oxygen  0 L        Oxygen  Initial Assessment:  Oxygen  Initial Assessment - 12/20/23 1355       Home Oxygen    Home Oxygen  Device Home Concentrator;Liquid Oxygen ;E-Tanks    Sleep Oxygen  Prescription CPAP    Liters per minute 2    Home Exercise Oxygen  Prescription Continuous   only wears when he feels like he needs it   Liters per minute 2    Home Resting Oxygen  Prescription Continuous   only wears when he feels like he needs it   Liters per minute 2    Compliance with Home Oxygen  Use Yes      Initial 6 min Walk   Oxygen  Used  None      Program Oxygen  Prescription   Program Oxygen  Prescription None      Intervention   Short Term Goals To learn and understand importance of monitoring SPO2  with pulse oximeter and demonstrate accurate use of the pulse oximeter.;To learn and understand importance of maintaining oxygen  saturations>88%;To learn and demonstrate proper pursed lip breathing techniques or other breathing techniques. ;To learn and demonstrate proper use of respiratory medications;To learn and exhibit compliance with exercise, home and travel O2 prescription    Long  Term Goals Maintenance of O2 saturations>88%;Compliance with respiratory medication;Verbalizes importance of monitoring SPO2 with pulse oximeter and return demonstration;Exhibits proper breathing techniques, such as pursed lip breathing or other method taught during program session;Demonstrates proper use of MDI's;Exhibits compliance with exercise, home  and travel O2 prescription          Oxygen  Re-Evaluation:   Oxygen  Discharge (Final Oxygen  Re-Evaluation):   Initial Exercise Prescription:  Initial Exercise Prescription - 12/20/23 1400       Date of Initial Exercise RX and Referring Provider   Date 12/20/23    Referring Provider Dr. Kara    Expected Discharge Date 03/19/24      NuStep   Level 1    SPM 75    Minutes 15    METs 1.8      Track   Laps 6    Minutes 15    METs 1.9      Prescription Details   Frequency (times per week) 2    Duration Progress to 30 minutes of continuous aerobic without signs/symptoms of physical distress      Intensity   THRR 40-80% of Max Heartrate 56-112    Ratings of Perceived Exertion 11-13    Perceived Dyspnea 0-4      Progression   Progression Continue to progress workloads to maintain intensity without signs/symptoms of physical distress.      Resistance Training   Training Prescription Yes    Weight red bands    Reps 10-15          Perform Capillary Blood Glucose checks as needed.  Exercise Prescription Changes:   Exercise Comments:   Exercise Goals and Review:   Exercise Goals     Row Name 12/20/23 1334              Exercise Goals   Increase Physical Activity Yes       Intervention Provide advice, education, support and counseling about physical activity/exercise needs.;Develop an individualized exercise prescription for aerobic and resistive training based on initial evaluation findings, risk stratification, comorbidities and participant's personal goals.       Expected Outcomes Short Term: Attend rehab on a regular basis to increase amount of physical activity.;Long Term: Add in home exercise to make exercise part of routine and to increase amount of physical activity.;Long Term: Exercising regularly at least 3-5 days a week.       Increase Strength and Stamina Yes       Intervention Provide advice, education, support and counseling about physical activity/exercise needs.;Develop an individualized exercise prescription for aerobic and resistive training based on initial evaluation findings, risk stratification, comorbidities and participant's personal goals.       Expected Outcomes Short Term: Increase workloads from initial exercise prescription for resistance, speed, and METs.;Short Term: Perform resistance training exercises routinely during rehab and add in resistance training at home;Long Term: Improve cardiorespiratory fitness, muscular endurance and strength as measured by increased METs and functional capacity ( )  Able to understand and use rate of perceived exertion (RPE) scale Yes       Intervention Provide education and explanation on how to use RPE scale       Expected Outcomes Short Term: Able to use RPE daily in rehab to express subjective intensity level;Long Term:  Able to use RPE to guide intensity level when exercising independently       Able to understand and use Dyspnea scale Yes       Intervention Provide education and explanation on how to use Dyspnea scale       Expected Outcomes Short Term: Able to use Dyspnea scale daily in rehab to express subjective sense of shortness of breath  during exertion;Long Term: Able to use Dyspnea scale to guide intensity level when exercising independently       Knowledge and understanding of Target Heart Rate Range (THRR) Yes       Intervention Provide education and explanation of THRR including how the numbers were predicted and where they are located for reference       Expected Outcomes Short Term: Able to state/look up THRR;Long Term: Able to use THRR to govern intensity when exercising independently;Short Term: Able to use daily as guideline for intensity in rehab       Understanding of Exercise Prescription Yes       Intervention Provide education, explanation, and written materials on patient's individual exercise prescription       Expected Outcomes Short Term: Able to explain program exercise prescription;Long Term: Able to explain home exercise prescription to exercise independently          Exercise Goals Re-Evaluation :   Discharge Exercise Prescription (Final Exercise Prescription Changes):   Nutrition:  Target Goals: Understanding of nutrition guidelines, daily intake of sodium 1500mg , cholesterol 200mg , calories 30% from fat and 7% or less from saturated fats, daily to have 5 or more servings of fruits and vegetables.  Biometrics:  Pre Biometrics - 12/20/23 1453       Pre Biometrics   Grip Strength 20 kg           Nutrition Therapy Plan and Nutrition Goals:   Nutrition Assessments:  MEDIFICTS Score Key: >=70 Need to make dietary changes  40-70 Heart Healthy Diet <= 40 Therapeutic Level Cholesterol Diet  Flowsheet Row PULMONARY REHAB OTHER RESPIRATORY from 08/18/2020 in [facility]  Picture Your Plate Total Score on Discharge 68   Picture Your Plate Scores: <59 Unhealthy dietary pattern with much room for improvement. 41-50 Dietary pattern unlikely to meet recommendations for good health and room for improvement. 51-60 More healthful dietary  pattern, with some room for improvement.  >60 Healthy dietary pattern, although there may be some specific behaviors that could be improved.    Nutrition Goals Re-Evaluation:   Nutrition Goals Discharge (Final Nutrition Goals Re-Evaluation):   Psychosocial: Target Goals: Acknowledge presence or absence of significant depression and/or stress, maximize coping skills, provide positive support system. Participant is able to verbalize types and ability to use techniques and skills needed for reducing stress and depression.  Initial Review & Psychosocial Screening:  Initial Psych Review & Screening - 12/20/23 1352       Initial Review   Current issues with None Identified      Family Dynamics   Good Support System? Yes      Screening Interventions   Interventions Encouraged to exercise  Quality of Life Scores:  Scores of 19 and below usually indicate a poorer quality of life in these areas.  A difference of  2-3 points is a clinically meaningful difference.  A difference of 2-3 points in the total score of the Quality of Life Index has been associated with significant improvement in overall quality of life, self-image, physical symptoms, and general health in studies assessing change in quality of life.  PHQ-9: Review Flowsheet       12/20/2023 06/19/2020  Depression screen PHQ 2/9  Decreased Interest 0 0  Down, Depressed, Hopeless 0 0  PHQ - 2 Score 0 0  Altered sleeping 0 0  Tired, decreased energy 1 3  Change in appetite 1 0  Feeling bad or failure about yourself  0 0  Trouble concentrating 0 1  Moving slowly or fidgety/restless 1 0  Suicidal thoughts 0 0  PHQ-9 Score 3 -  Difficult doing work/chores Very difficult Somewhat difficult   Interpretation of Total Score  Total Score Depression Severity:  1-4 = Minimal depression, 5-9 = Mild depression, 10-14 = Moderate depression, 15-19 = Moderately severe depression, 20-27 = Severe depression   Psychosocial  Evaluation and Intervention:  Psychosocial Evaluation - 12/20/23 1441       Psychosocial Evaluation & Interventions   Interventions Encouraged to exercise with the program and follow exercise prescription    Comments Buddy denies any psychosocial barriers or concerns at this time    Expected Outcomes For Buddy to participate in PR free of any psychosocial barriers or concerns    Continue Psychosocial Services  No Follow up required          Psychosocial Re-Evaluation:   Psychosocial Discharge (Final Psychosocial Re-Evaluation):   Education: Education Goals: Education classes will be provided on a weekly basis, covering required topics. Participant will state understanding/return demonstration of topics presented.  Learning Barriers/Preferences:  Learning Barriers/Preferences - 12/20/23 1353       Learning Barriers/Preferences   Learning Barriers Sight;Hearing   wears hearing aids   Learning Preferences None          Education Topics: Know Your Numbers Group instruction that is supported by a PowerPoint presentation. Instructor discusses importance of knowing and understanding resting, exercise, and post-exercise oxygen  saturation, heart rate, and blood pressure. Oxygen  saturation, heart rate, blood pressure, rating of perceived exertion, and dyspnea are reviewed along with a normal range for these values.    Exercise for the Pulmonary Patient Group instruction that is supported by a PowerPoint presentation. Instructor discusses benefits of exercise, core components of exercise, frequency, duration, and intensity of an exercise routine, importance of utilizing pulse oximetry during exercise, safety while exercising, and options of places to exercise outside of rehab.    MET Level  Group instruction provided by PowerPoint, verbal discussion, and written material to support subject matter. Instructor reviews what METs are and how to increase METs.    Pulmonary  Medications Verbally interactive group education provided by instructor with focus on inhaled medications and proper administration.   Anatomy and Physiology of the Respiratory System Group instruction provided by PowerPoint, verbal discussion, and written material to support subject matter. Instructor reviews respiratory cycle and anatomical components of the respiratory system and their functions. Instructor also reviews differences in obstructive and restrictive respiratory diseases with examples of each.  Flowsheet Row PULMONARY REHAB OTHER RESPIRATORY from 08/13/2020 in [facility]  Date 07/02/20  Educator Handout  Oxygen  Safety Group instruction provided by PowerPoint, verbal discussion, and written material to support subject matter. There is an overview of "What is Oxygen " and "Why do we need it".  Instructor also reviews how to create a safe environment for oxygen  use, the importance of using oxygen  as prescribed, and the risks of noncompliance. There is a brief discussion on traveling with oxygen  and resources the patient may utilize. Flowsheet Row PULMONARY REHAB OTHER RESPIRATORY from 08/13/2020 in [facility]  Date 06/25/20  Educator handout    Oxygen  Use Group instruction provided by PowerPoint, verbal discussion, and written material to discuss how supplemental oxygen  is prescribed and different types of oxygen  supply systems. Resources for more information are provided.    Breathing Techniques Group instruction that is supported by demonstration and informational handouts. Instructor discusses the benefits of pursed lip and diaphragmatic breathing and detailed demonstration on how to perform both.     Risk Factor Reduction Group instruction that is supported by a PowerPoint presentation. Instructor discusses the definition of a risk factor, different risk  factors for pulmonary disease, and how the heart and lungs work together.   Pulmonary Diseases Group instruction provided by PowerPoint, verbal discussion, and written material to support subject matter. Instructor gives an overview of the different type of pulmonary diseases. There is also a discussion on risk factors and symptoms as well as ways to manage the diseases.   Stress and Energy Conservation Group instruction provided by PowerPoint, verbal discussion, and written material to support subject matter. Instructor gives an overview of stress and the impact it can have on the body. Instructor also reviews ways to reduce stress. There is also a discussion on energy conservation and ways to conserve energy throughout the day.   Warning Signs and Symptoms Group instruction provided by PowerPoint, verbal discussion, and written material to support subject matter. Instructor reviews warning signs and symptoms of stroke, heart attack, cold and flu. Instructor also reviews ways to prevent the spread of infection.   Other Education Group or individual verbal, written, or video instructions that support the educational goals of the pulmonary rehab program. Flowsheet Row PULMONARY REHAB OTHER RESPIRATORY from 08/13/2020 in [facility]  Date 08/13/20  [Beat a Sedentary Lifestyle]  Educator handout  [Rate my plate]     Knowledge Questionnaire Score:  Knowledge Questionnaire Score - 12/20/23 1336       Knowledge Questionnaire Score   Pre Score 15/18          Core Components/Risk Factors/Patient Goals at Admission:  Personal Goals and Risk Factors at Admission - 12/20/23 1353       Core Components/Risk Factors/Patient Goals on Admission   Improve shortness of breath with ADL's Yes    Intervention Provide education, individualized exercise plan and daily activity instruction to help decrease symptoms of SOB with activities of  daily living.    Expected Outcomes Short Term: Improve cardiorespiratory fitness to achieve a reduction of symptoms when performing ADLs;Long Term: Be able to perform more ADLs without symptoms or delay the onset of symptoms          Core Components/Risk Factors/Patient Goals Review:    Core Components/Risk Factors/Patient Goals at Discharge (Final Review):    ITP Comments:   Comments: Dr. Slater Staff is Medical Director for Pulmonary Rehab at [facility].

## 2023-12-20 NOTE — Progress Notes (Signed)
 Pulmonary Rehab Orientation Assessment Note  Patient Details  Name: Alex [name]. MRN: [id number] Date of Birth: [birth year] Referring Provider:  Dr. Kara  Well appearing, A&Ox4, NAD Eyes/Ears: + glasses, + hearing loss Lungs: Decreased with no wheezes, rales, rhonchi, denies chronic cough, dyspnea on exertion, has home O2 yet no prescription Heart: Regular rate rhythm, no murmurs, no rubs, no clicks Gastrointestinal: abdomin soft, + bowel sounds in all 4 quads, denies recent weight gain or loss, endorses normal BMs Genitourinary: WNL, pt denies s/s Extremities:  +2 pulses, grip strength equal, strong, no edema, no cyanosis, no clubbing Integumentary: pt denies any rashes, open or non healing wounds Psy/Soc: Pt denies any needs, resources or referrals Assistive devices: Home O2 concentrator, portable liquid oxygen 

## 2023-12-27 NOTE — Progress Notes (Signed)
 Pulmonary Individual Treatment Plan  Patient Details  Name: Alex [name]. MRN: [id number] Date of Birth: [birth year] Referring Provider:   Conrad Ports Pulmonary Rehab Walk Test from 12/20/2023 in [facility]  Referring Provider Dr. Kara    Initial Encounter Date:  Flowsheet Row Pulmonary Rehab Walk Test from 12/20/2023 in [facility] for Heart, Vascular, & Lung Health  Date 12/20/23    Visit Diagnosis: Pulmonary fibrosis (HCC)  Patient's Home Medications on Admission:   Current Outpatient Medications:    albuterol  (ACCUNEB ) 1.25 MG/3ML nebulizer solution, Take 1 ampule by nebulization every 6 (six) hours as needed for shortness of breath or wheezing., Disp: , Rfl:    apixaban  (ELIQUIS ) 5 MG TABS tablet, Take 5 mg by mouth 2 (two) times daily., Disp: , Rfl:    Ascorbic Acid  (VITAMIN C ) 1000 MG tablet, Take 1,000 mg by mouth daily., Disp: , Rfl:    atorvastatin  (LIPITOR) 40 MG tablet, Take 40 mg by mouth daily., Disp: , Rfl:    capsaicin (ZOSTRIX) 0.025 % cream, Apply 1 Application topically as needed (aches and pains)., Disp: , Rfl:    carvedilol  (COREG ) 6.25 MG tablet, Take 3.125-6.25 mg by mouth See admin instructions. 6.25 mg in the morning, 3.125 mg in the evening, Disp: , Rfl:    Cholecalciferol  125 MCG (5000 UT) TABS, Take 5,000 Units by mouth 2 (two) times a day., Disp: , Rfl:    diclofenac Sodium (VOLTAREN) 1 % GEL, Apply topically., Disp: , Rfl:    ezetimibe  (ZETIA ) 10 MG tablet, Take 10 mg by mouth daily., Disp: , Rfl:    fluticasone  (FLONASE ) 50 MCG/ACT nasal spray, Place 2 sprays into both nostrils daily., Disp: 16 g, Rfl: 5   furosemide  (LASIX ) 40 MG tablet, Take 40 mg by mouth daily. , Disp: , Rfl:    Ipratropium-Albuterol  (COMBIVENT RESPIMAT) 20-100 MCG/ACT AERS respimat, Inhale 1 puff into the lungs every 6 (six) hours as needed for wheezing or shortness of breath., Disp: , Rfl:     isosorbide  mononitrate (IMDUR ) 30 MG 24 hr tablet, Take 30 mg by mouth daily., Disp: , Rfl:    lidocaine  (LIDODERM ) 5 %, Place 1 patch onto the skin daily as needed (pain)., Disp: , Rfl:    Multiple Vitamin (MULTI-VITAMINS) TABS, Take 1 tablet by mouth at bedtime., Disp: , Rfl:    mupirocin  ointment (BACTROBAN ) 2 %, Apply 1 Application topically daily as needed (wound care)., Disp: , Rfl:    nitroGLYCERIN  (NITROSTAT ) 0.4 MG SL tablet, DISSOLVE ONE TABLET UNDER TONGUE EVERY 5 MINUTES AS NEEDED UP TO 3 DOSES, IF NORELIEF CALL 911, Disp: 25 tablet, Rfl: 11   pantoprazole  (PROTONIX ) 40 MG tablet, Take 1 tablet (40 mg total) by mouth 2 (two) times daily., Disp: 180 tablet, Rfl: 1   potassium chloride  SA (KLOR-CON ) 20 MEQ tablet, Take 20 mEq by mouth daily., Disp: , Rfl:    Probiotic Product (PROBIOTIC-10 PO), Take 1 capsule by mouth daily., Disp: , Rfl:    tamsulosin  (FLOMAX ) 0.4 MG CAPS capsule, Take 0.4 mg by mouth 2 (two) times daily., Disp: , Rfl:    triamcinolone  cream (KENALOG ) 0.1 %, Apply 1 Application topically daily as needed (irritation)., Disp: , Rfl:    vitamin B-12 (CYANOCOBALAMIN) 250 MCG tablet, Take 500 mcg by mouth daily., Disp: , Rfl:    zinc gluconate 50 MG tablet, Take 50 mg by mouth daily., Disp: , Rfl:   Past  Medical History: Past Medical History:  Diagnosis Date   AL amyloidosis [facility]) oncologist-- dr onesimo   Renal and Systemic , dx 04/ 2018;  renal bx 08-05-2016 and bone marrow bx 08-29-2016;  8 month chemotherapy then stell cell transplant 12/ 2018 then chemo again;  since then been on oral chemo daily (11-07-2019 per pt has been on hold until after prostate seed implants)   Arthritis    Atypical chest pain    CAD (coronary artery disease) cardiologist-- dr verlin;  also [facility] [place], dr d. sarah   moderate nonobstrucive disease by cath 2011; Lexiscan  Myoview  6/14--Normal study, no scar or ischemia, EF 62%;  11-14-2014 nuclear study low  risk w/ normal perfusion, nuclear ef 67%   CKD (chronic kidney disease), stage III (HCC)    COVID    b/l PNA (7/20)- 21 day hospitalization tx with steroids, Remdesevir, Actemra , Hydroxychloroquine ) --> PE on Eliquis .   Diverticulosis of colon    Dyspnea    WITH EXERTION    ED (erectile dysfunction)    Fatty liver    GERD (gastroesophageal reflux disease)    H/O stem cell transplant (HCC) 03/2017   Headache    Heart murmur    Hiatal hernia    History of 2019 novel coronavirus disease (COVID-19) 09/2018   [facility] 06[facility], hospital admission 10-28-2019 respiratory failure/ pneumometrostinom/ PE ;  was not intubated , treatment with remdesivir / steroids/ actemra / plasma   History of basal cell carcinoma (BCC)    History of colon polyps    History of MRSA infection    culture positive   History of pulmonary embolus (PE) 10/2018   with covid 19, completed treatment with eliquis    History of seizures    11-07-2019  per pt last one 1970s, unknown cause   History of vertebral compression fracture 1988   lumbar   Hx of adenomatous colonic polyps    negative in 7/08   Hyperlipidemia    Hypertension    IFG (impaired fasting glucose)    Neuropathy due to chemotherapeutic drug (HCC)    fingers tips and feet   Nocturia    On supplemental oxygen  therapy    post covid 19;   11-07-2019 pt uses O2 every night 2L via [place],  and uses supplements during the day when walking around neighborhood and goes out to appointments/ store that he has to walk a distance   OSA (obstructive sleep apnea)    study in epic 03-18-2005 mild to moderate   (11-07-2019  per pt used cpap up until when he lost alot of weight, stoped approx. 2012   Pneumonia    Prostate cancer [facility]) urologist--- dr pace/  oncology-- dr patrcia   dx 03/ 2021   Pulmonary fibrosis (HCC) 09/2018   pulmonologist-- dr theophilus;   post covid 19   Seizure (HCC)    1st in 1976; last in 1978 GTC x 2    SOB (shortness of breath)  on exertion    11-07-2019  per pt walks around neighborhood for 30 minutes twice daily, he does have to stop frequently d/t sob and since weather has been hot he uses his supplemental oxygen ;  stated he has a pulse ox. at home , when he is sitting down O2 sat 96 -97% on RA when up walking around the house O2 sat 93-94% on RA;  pt stated he carry's his O2 w/ him when he goes to doctor appts/store d/t  distance   Wears glasses    Wears hearing aid in both ears     Tobacco Use: Social History   Tobacco Use  Smoking Status Former   Current packs/day: 0.00   Average packs/day: 3.0 packs/day for 20.0 years (60.0 ttl pk-yrs)   Types: Cigarettes   Start date: 04/25/1949   Quit date: 04/25/1969   Years since quitting: 54.7  Smokeless Tobacco Former   Types: Chew   Quit date: 11/07/1983    Labs: Review Flowsheet  More data exists      Latest Ref Rng & Units 10/29/2018 11/08/2018 09/24/2020 11/03/2020 06/28/2022  Labs for ITP Cardiac and Pulmonary Rehab  Trlycerides <150 mg/dL 896  - - - -  Hemoglobin A1c 4.8 - 5.6 % - - 6.0  6.5  5.5   PH, Arterial 7.350 - 7.450 - 7.528  - - -  PCO2 arterial 32.0 - 48.0 mmHg - 27.2  - - -  Bicarbonate 20.0 - 28.0 mmol/L - 22.7  - - -  TCO2 22 - 32 mmol/L - 24  - - -  O2 Saturation % - 90.0  - - -    Capillary Blood Glucose: Lab Results  Component Value Date   GLUCAP 119 (H) 06/28/2022   GLUCAP 113 (H) 11/09/2018   GLUCAP 143 (H) 11/08/2018   GLUCAP 115 (H) 11/08/2018   GLUCAP 153 (H) 11/08/2018     Pulmonary Assessment Scores:  Pulmonary Assessment Scores     Row Name 12/20/23 1439         ADL UCSD   ADL Phase Entry     SOB Score total 84       CAT Score   CAT Score 31       mMRC Score   mMRC Score 4       UCSD: Self-administered rating of dyspnea associated with activities of daily living (ADLs) 6-point scale (0 = not at all to 5 = maximal or unable to do because of breathlessness)  Scoring Scores range from 0 to 120.  Minimally  important difference is 5 units  CAT: CAT can identify the health impairment of COPD patients and is better correlated with disease progression.  CAT has a scoring range of zero to 40. The CAT score is classified into four groups of low (less than 10), medium (10 - 20), high (21-30) and very high (31-40) based on the impact level of disease on health status. A CAT score over 10 suggests significant symptoms.  A worsening CAT score could be explained by an exacerbation, poor medication adherence, poor inhaler technique, or progression of COPD or comorbid conditions.  CAT MCID is 2 points  mMRC: mMRC (Modified Medical Research Council) Dyspnea Scale is used to assess the degree of baseline functional disability in patients of respiratory disease due to dyspnea. No minimal important difference is established. A decrease in score of 1 point or greater is considered a positive change.   Pulmonary Function Assessment:  Pulmonary Function Assessment - 12/20/23 1358       Breath   Bilateral Breath Sounds Decreased    Shortness of Breath Yes;Limiting activity          Exercise Target Goals: Exercise Program Goal: Individual exercise prescription set using results from initial 6 min walk test and THRR while considering  patient's activity barriers and safety.   Exercise Prescription Goal: Initial exercise prescription builds to 30-45 minutes a day of aerobic activity, 2-3 days per week.  Home exercise guidelines  will be given to patient during program as part of exercise prescription that the participant will acknowledge.  Activity Barriers & Risk Stratification:  Activity Barriers & Cardiac Risk Stratification - 12/20/23 1353       Activity Barriers & Cardiac Risk Stratification   Activity Barriers Deconditioning;Muscular Weakness;Shortness of Breath;Balance Concerns    Cardiac Risk Stratification Moderate          6 Minute Walk:  6 Minute Walk     Row Name 12/20/23 1445          6 Minute Walk   Phase Initial     Distance 629 feet     Walk Time 6 minutes     # of Rest Breaks 1  4:31-5:13     MPH 1.19     METS 1.08     RPE 15     Perceived Dyspnea  4     VO2 Peak 3.79     Symptoms No     Resting HR 75 bpm     Resting BP 108/60     Resting Oxygen  Saturation  95 %     Exercise Oxygen  Saturation  during 6 min walk 94 %     Max Ex. HR 75 bpm     Max Ex. BP 126/70     2 Minute Post BP 120/70       Interval HR   1 Minute HR 73     2 Minute HR 74     3 Minute HR 71     4 Minute HR 71     5 Minute HR 72     6 Minute HR 66     2 Minute Post HR 62     Interval Heart Rate? Yes       Interval Oxygen    Interval Oxygen ? Yes     Baseline Oxygen  Saturation % 95 %     1 Minute Oxygen  Saturation % 96 %     1 Minute Liters of Oxygen  0 L     2 Minute Oxygen  Saturation % 96 %     2 Minute Liters of Oxygen  0 L     3 Minute Oxygen  Saturation % 95 %     3 Minute Liters of Oxygen  0 L     4 Minute Oxygen  Saturation % 95 %     4 Minute Liters of Oxygen  0 L     5 Minute Oxygen  Saturation % 94 %     5 Minute Liters of Oxygen  0 L     6 Minute Oxygen  Saturation % 95 %     6 Minute Liters of Oxygen  0 L     2 Minute Post Oxygen  Saturation % 95 %     2 Minute Post Liters of Oxygen  0 L        Oxygen  Initial Assessment:  Oxygen  Initial Assessment - 12/20/23 1355       Home Oxygen    Home Oxygen  Device Home Concentrator;Liquid Oxygen ;E-Tanks    Sleep Oxygen  Prescription CPAP    Liters per minute 2    Home Exercise Oxygen  Prescription Continuous   only wears when he feels like he needs it   Liters per minute 2    Home Resting Oxygen  Prescription Continuous   only wears when he feels like he needs it   Liters per minute 2    Compliance with Home Oxygen  Use Yes      Initial 6 min Walk   Oxygen  Used  None      Program Oxygen  Prescription   Program Oxygen  Prescription None      Intervention   Short Term Goals To learn and understand importance of monitoring SPO2  with pulse oximeter and demonstrate accurate use of the pulse oximeter.;To learn and understand importance of maintaining oxygen  saturations>88%;To learn and demonstrate proper pursed lip breathing techniques or other breathing techniques. ;To learn and demonstrate proper use of respiratory medications;To learn and exhibit compliance with exercise, home and travel O2 prescription    Long  Term Goals Maintenance of O2 saturations>88%;Compliance with respiratory medication;Verbalizes importance of monitoring SPO2 with pulse oximeter and return demonstration;Exhibits proper breathing techniques, such as pursed lip breathing or other method taught during program session;Demonstrates proper use of MDI's;Exhibits compliance with exercise, home  and travel O2 prescription          Oxygen  Re-Evaluation:   Oxygen  Discharge (Final Oxygen  Re-Evaluation):   Initial Exercise Prescription:  Initial Exercise Prescription - 12/20/23 1400       Date of Initial Exercise RX and Referring Provider   Date 12/20/23    Referring Provider Dr. Kara    Expected Discharge Date 03/19/24      NuStep   Level 1    SPM 75    Minutes 15    METs 1.8      Track   Laps 6    Minutes 15    METs 1.9      Prescription Details   Frequency (times per week) 2    Duration Progress to 30 minutes of continuous aerobic without signs/symptoms of physical distress      Intensity   THRR 40-80% of Max Heartrate 56-112    Ratings of Perceived Exertion 11-13    Perceived Dyspnea 0-4      Progression   Progression Continue to progress workloads to maintain intensity without signs/symptoms of physical distress.      Resistance Training   Training Prescription Yes    Weight red bands    Reps 10-15          Perform Capillary Blood Glucose checks as needed.  Exercise Prescription Changes:   Exercise Comments:   Exercise Goals and Review:   Exercise Goals     Row Name 12/20/23 1334              Exercise Goals   Increase Physical Activity Yes       Intervention Provide advice, education, support and counseling about physical activity/exercise needs.;Develop an individualized exercise prescription for aerobic and resistive training based on initial evaluation findings, risk stratification, comorbidities and participant's personal goals.       Expected Outcomes Short Term: Attend rehab on a regular basis to increase amount of physical activity.;Long Term: Add in home exercise to make exercise part of routine and to increase amount of physical activity.;Long Term: Exercising regularly at least 3-5 days a week.       Increase Strength and Stamina Yes       Intervention Provide advice, education, support and counseling about physical activity/exercise needs.;Develop an individualized exercise prescription for aerobic and resistive training based on initial evaluation findings, risk stratification, comorbidities and participant's personal goals.       Expected Outcomes Short Term: Increase workloads from initial exercise prescription for resistance, speed, and METs.;Short Term: Perform resistance training exercises routinely during rehab and add in resistance training at home;Long Term: Improve cardiorespiratory fitness, muscular endurance and strength as measured by increased METs and functional capacity ( )  Able to understand and use rate of perceived exertion (RPE) scale Yes       Intervention Provide education and explanation on how to use RPE scale       Expected Outcomes Short Term: Able to use RPE daily in rehab to express subjective intensity level;Long Term:  Able to use RPE to guide intensity level when exercising independently       Able to understand and use Dyspnea scale Yes       Intervention Provide education and explanation on how to use Dyspnea scale       Expected Outcomes Short Term: Able to use Dyspnea scale daily in rehab to express subjective sense of shortness of breath  during exertion;Long Term: Able to use Dyspnea scale to guide intensity level when exercising independently       Knowledge and understanding of Target Heart Rate Range (THRR) Yes       Intervention Provide education and explanation of THRR including how the numbers were predicted and where they are located for reference       Expected Outcomes Short Term: Able to state/look up THRR;Long Term: Able to use THRR to govern intensity when exercising independently;Short Term: Able to use daily as guideline for intensity in rehab       Understanding of Exercise Prescription Yes       Intervention Provide education, explanation, and written materials on patient's individual exercise prescription       Expected Outcomes Short Term: Able to explain program exercise prescription;Long Term: Able to explain home exercise prescription to exercise independently          Exercise Goals Re-Evaluation :  Exercise Goals Re-Evaluation     Row Name 12/22/23 0939             Exercise Goal Re-Evaluation   Exercise Goals Review Increase Physical Activity;Increase Strength and Stamina;Able to understand and use rate of perceived exertion (RPE) scale;Able to understand and use Dyspnea scale;Knowledge and understanding of Target Heart Rate Range (THRR);Understanding of Exercise Prescription       Comments Alex [name] is schedule to begin exercise on 9/4. Will continue to monitor and progress as able.       Expected Outcomes Through exercise at rehab and home, the patient will decrease shortness of breath with daily activities and feel confident in carrying out an exercise regimen at home.          Discharge Exercise Prescription (Final Exercise Prescription Changes):   Nutrition:  Target Goals: Understanding of nutrition guidelines, daily intake of sodium 1500mg , cholesterol 200mg , calories 30% from fat and 7% or less from saturated fats, daily to have 5 or more servings of fruits and vegetables.  Biometrics:   Pre Biometrics - 12/20/23 1453       Pre Biometrics   Grip Strength 20 kg           Nutrition Therapy Plan and Nutrition Goals:   Nutrition Assessments:  MEDIFICTS Score Key: >=70 Need to make dietary changes  40-70 Heart Healthy Diet <= 40 Therapeutic Level Cholesterol Diet  Flowsheet Row PULMONARY REHAB OTHER RESPIRATORY from 08/18/2020 in [facility]ascular[facility]  Picture Your Plate Total Score on Discharge 68   Picture Your Plate Scores: <59 Unhealthy dietary pattern with much room for improvement. 41-50 Dietary pattern unlikely to meet recommendations for good health and room for improvement. 51-60 More healthful dietary pattern, with some room for improvement.  >60 Healthy dietary pattern,  although there may be some specific behaviors that could be improved.    Nutrition Goals Re-Evaluation:   Nutrition Goals Discharge (Final Nutrition Goals Re-Evaluation):   Psychosocial: Target Goals: Acknowledge presence or absence of significant depression and/or stress, maximize coping skills, provide positive support system. Participant is able to verbalize types and ability to use techniques and skills needed for reducing stress and depression.  Initial Review & Psychosocial Screening:  Initial Psych Review & Screening - 12/20/23 1352       Initial Review   Current issues with None Identified      Family Dynamics   Good Support System? Yes      Screening Interventions   Interventions Encouraged to exercise          Quality of Life Scores:  Scores of 19 and below usually indicate a poorer quality of life in these areas.  A difference of  2-3 points is a clinically meaningful difference.  A difference of 2-3 points in the total score of the Quality of Life Index has been associated with significant improvement in overall quality of life, self-image, physical symptoms, and general health in studies assessing change in  quality of life.  PHQ-9: Review Flowsheet       12/20/2023 06/19/2020  Depression screen PHQ 2/9  Decreased Interest 0 0  Down, Depressed, Hopeless 0 0  PHQ - 2 Score 0 0  Altered sleeping 0 0  Tired, decreased energy 1 3  Change in appetite 1 0  Feeling bad or failure about yourself  0 0  Trouble concentrating 0 1  Moving slowly or fidgety/restless 1 0  Suicidal thoughts 0 0  PHQ-9 Score 3 -  Difficult doing work/chores Very difficult Somewhat difficult   Interpretation of Total Score  Total Score Depression Severity:  1-4 = Minimal depression, 5-9 = Mild depression, 10-14 = Moderate depression, 15-19 = Moderately severe depression, 20-27 = Severe depression   Psychosocial Evaluation and Intervention:  Psychosocial Evaluation - 12/20/23 1441       Psychosocial Evaluation & Interventions   Interventions Encouraged to exercise with the program and follow exercise prescription    Comments Alex [name] denies any psychosocial barriers or concerns at this time    Expected Outcomes For Alex [name] to participate in PR free of any psychosocial barriers or concerns    Continue Psychosocial Services  No Follow up required          Psychosocial Re-Evaluation:  Psychosocial Re-Evaluation     Row Name 12/26/23 [phone]             Psychosocial Re-Evaluation   Current issues with None Identified       Comments Alex [name] is scheduled to start PR on 12/28/23. No new concerns since orientation       Expected Outcomes For Alex [name] to participate in PR free of any psychosocial barriers or concerns       Interventions Encouraged to attend Pulmonary Rehabilitation for the exercise       Continue Psychosocial Services  No Follow up required          Psychosocial Discharge (Final Psychosocial Re-Evaluation):  Psychosocial Re-Evaluation - 12/26/23 9057       Psychosocial Re-Evaluation   Current issues with None Identified    Comments Alex [name] is scheduled to start PR on 12/28/23. No new concerns since  orientation    Expected Outcomes For Alex [name] to participate in PR free of any psychosocial barriers or concerns    Interventions Encouraged to attend Pulmonary  Rehabilitation for the exercise    Continue Psychosocial Services  No Follow up required          Education: Education Goals: Education classes will be provided on a weekly basis, covering required topics. Participant will state understanding/return demonstration of topics presented.  Learning Barriers/Preferences:  Learning Barriers/Preferences - 12/20/23 1353       Learning Barriers/Preferences   Learning Barriers Sight;Hearing   wears hearing aids   Learning Preferences None          Education Topics: Know Your Numbers Group instruction that is supported by a PowerPoint presentation. Instructor discusses importance of knowing and understanding resting, exercise, and post-exercise oxygen  saturation, heart rate, and blood pressure. Oxygen  saturation, heart rate, blood pressure, rating of perceived exertion, and dyspnea are reviewed along with a normal range for these values.    Exercise for the Pulmonary Patient Group instruction that is supported by a PowerPoint presentation. Instructor discusses benefits of exercise, core components of exercise, frequency, duration, and intensity of an exercise routine, importance of utilizing pulse oximetry during exercise, safety while exercising, and options of places to exercise outside of rehab.    MET Level  Group instruction provided by PowerPoint, verbal discussion, and written material to support subject matter. Instructor reviews what METs are and how to increase METs.    Pulmonary Medications Verbally interactive group education provided by instructor with focus on inhaled medications and proper administration.   Anatomy and Physiology of the Respiratory System Group instruction provided by PowerPoint, verbal discussion, and written material to support subject matter.  Instructor reviews respiratory cycle and anatomical components of the respiratory system and their functions. Instructor also reviews differences in obstructive and restrictive respiratory diseases with examples of each.  Flowsheet Row PULMONARY REHAB OTHER RESPIRATORY from 08/13/2020 in [facility], [facility]  Date 07/02/20  Educator Handout    Oxygen  Safety Group instruction provided by PowerPoint, verbal discussion, and written material to support subject matter. There is an overview of "What is Oxygen " and "Why do we need it".  Instructor also reviews how to create a safe environment for oxygen  use, the importance of using oxygen  as prescribed, and the risks of noncompliance. There is a brief discussion on traveling with oxygen  and resources the patient may utilize. Flowsheet Row PULMONARY REHAB OTHER RESPIRATORY from 08/13/2020 in [facility] for Heart, Vascular, & Lung Health  Date 06/25/20  Educator handout    Oxygen  Use Group instruction provided by PowerPoint, verbal discussion, and written material to discuss how supplemental oxygen  is prescribed and different types of oxygen  supply systems. Resources for more information are provided.    Breathing Techniques Group instruction that is supported by demonstration and informational handouts. Instructor discusses the benefits of pursed lip and diaphragmatic breathing and detailed demonstration on how to perform both.     Risk Factor Reduction Group instruction that is supported by a PowerPoint presentation. Instructor discusses the definition of a risk factor, different risk factors for pulmonary disease, and how the heart and lungs work together.   Pulmonary Diseases Group instruction provided by PowerPoint, verbal discussion, and written material to support subject matter. Instructor gives an overview of the different type of pulmonary diseases. There is also a  discussion on risk factors and symptoms as well as ways to manage the diseases.   Stress and Energy Conservation Group instruction provided by PowerPoint, verbal discussion, and written material to support subject matter. Instructor gives an  overview of stress and the impact it can have on the body. Instructor also reviews ways to reduce stress. There is also a discussion on energy conservation and ways to conserve energy throughout the day.   Warning Signs and Symptoms Group instruction provided by PowerPoint, verbal discussion, and written material to support subject matter. Instructor reviews warning signs and symptoms of stroke, heart attack, cold and flu. Instructor also reviews ways to prevent the spread of infection.   Other Education Group or individual verbal, written, or video instructions that support the educational goals of the pulmonary rehab program. Flowsheet Row PULMONARY REHAB OTHER RESPIRATORY from 08/13/2020 in [facility] for [facility], V[facility], & [facility]  Date 08/13/20  [Beat a Sedentary Lifestyle]  Educator handout  [Rate my plate]     Knowledge Questionnaire Score:  Knowledge Questionnaire Score - 12/20/23 1336       Knowledge Questionnaire Score   Pre Score 15/18          Core Components/Risk Factors/Patient Goals at Admission:  Personal Goals and Risk Factors at Admission - 12/20/23 1353       Core Components/Risk Factors/Patient Goals on Admission   Improve shortness of breath with ADL's Yes    Intervention Provide education, individualized exercise plan and daily activity instruction to help decrease symptoms of SOB with activities of daily living.    Expected Outcomes Short Term: Improve cardiorespiratory fitness to achieve a reduction of symptoms when performing ADLs;Long Term: Be able to perform more ADLs without symptoms or delay the onset of symptoms          Core Components/Risk Factors/Patient Goals Review:   Goals  and Risk Factor Review     Row Name 12/26/23 0944             Core Components/Risk Factors/Patient Goals Review   Personal Goals Review Improve shortness of breath with ADL's;Develop more efficient breathing techniques such as purse lipped breathing and diaphragmatic breathing and practicing self-pacing with activity.       Review Alex [name] has not started PR yet. Unable to assess his goals at this time.       Expected Outcomes See admission goals.          Core Components/Risk Factors/Patient Goals at Discharge (Final Review):   Goals and Risk Factor Review - 12/26/23 0944       Core Components/Risk Factors/Patient Goals Review   Personal Goals Review Improve shortness of breath with ADL's;Develop more efficient breathing techniques such as purse lipped breathing and diaphragmatic breathing and practicing self-pacing with activity.    Review Alex [name] has not started PR yet. Unable to assess his goals at this time.    Expected Outcomes See admission goals.          ITP Comments: Dr. Slater Staff is Medical Director for Pulmonary Rehab at [facility].

## 2023-12-28 ENCOUNTER — Encounter (HOSPITAL_COMMUNITY)
Admission: RE | Admit: 2023-12-28 | Discharge: 2023-12-28 | Disposition: A | Discharge status: Home / Self Care | Source: Ambulatory Visit | Attending: Pulmonary Disease | Admitting: Pulmonary Disease

## 2023-12-28 DIAGNOSIS — I350 Nonrheumatic aortic (valve) stenosis: Secondary | ICD-10-CM | POA: Insufficient documentation

## 2023-12-28 DIAGNOSIS — J8410 Pulmonary fibrosis, unspecified: Secondary | ICD-10-CM | POA: Insufficient documentation

## 2023-12-28 NOTE — Progress Notes (Signed)
 Daily Session Note  Patient Details  Name: Alex [name]. MRN: [id number] Date of Birth: 03-09-1941 Referring Provider:   Conrad Ports Pulmonary Rehab Walk Test from 12/20/2023 in [facility] for Heart, Vascular, & Lung Health  Referring Provider Dr. Kara    Encounter Date: 12/28/2023  Check In:  Session Check In - 12/28/23 1158       Check-In   Supervising physician immediately available to respond to emergencies [facility] MD immediately available    Physician(s) Barnie Press, NP    Location [facility]    Staff Present Ronal Levin, RN, BSN;Niyana Chesbro Claudene, RT;Randi [facility] BS, ACSM-CEP, Exercise Physiologist;Kaylee Nicholaus, MS, ACSM-CEP, Exercise Physiologist    Virtual Visit No    Medication changes reported     No    Fall or balance concerns reported    No    Tobacco Cessation No Change    Warm-up and Cool-down Performed as group-led instruction    Resistance Training Performed Yes    VAD Patient? No    PAD/SET Patient? No      Pain Assessment   Currently in Pain? No/denies    Multiple Pain Sites No          Capillary Blood Glucose: No results found for this or any previous visit (from the past 24 hours).    Social History   Tobacco Use  Smoking Status Former   Current packs/day: 0.00   Average packs/day: 3.0 packs/day for 20.0 years (60.0 ttl pk-yrs)   Types: Cigarettes   Start date: 04/25/1949   Quit date: 04/25/1969   Years since quitting: 54.7  Smokeless Tobacco Former   Types: Chew   Quit date: 11/07/1983    Goals Met:  Proper associated with RPD/PD & O2 Sat Independence with exercise equipment Exercise tolerated well No report of concerns or symptoms today Strength training completed today  Goals Unmet:  Not Applicable  Comments: Service time is from 1007 to 1140.    Dr. Slater Staff is Medical Director for Pulmonary Rehab at [facility].

## 2023-12-29 ENCOUNTER — Telehealth: Payer: Self-pay

## 2023-12-29 NOTE — Telephone Encounter (Signed)
 Copied from CRM [id number]. Topic: Clinical - Medication Question >> Dec 29, 2023 10:09 AM Rozanna MATSU wrote: Reason for CRM: pt stated his physical therapist stated he should be on Esbriet and osevof and wanted provider and nurse to check into this.  Pt has appt with you 12/5. Please advise

## 2024-01-02 ENCOUNTER — Encounter (HOSPITAL_COMMUNITY)
Admission: RE | Admit: 2024-01-02 | Discharge: 2024-01-02 | Disposition: A | Discharge status: Home / Self Care | Source: Ambulatory Visit | Attending: Pulmonary Disease

## 2024-01-02 DIAGNOSIS — J8410 Pulmonary fibrosis, unspecified: Secondary | ICD-10-CM

## 2024-01-02 DIAGNOSIS — I350 Nonrheumatic aortic (valve) stenosis: Secondary | ICD-10-CM | POA: Diagnosis not present

## 2024-01-02 NOTE — Progress Notes (Signed)
 Daily Session Note  Patient Details  Name: Alex [name]. MRN: [id number] Date of Birth: August 09, 1940 Referring Provider:   Conrad Ports Pulmonary Rehab Walk Test from 12/20/2023 in [facility] for Heart, Vascular, & Lung Health  Referring Provider Dr. Kara    Encounter Date: 01/02/2024  Check In:  Session Check In - 01/02/24 1030       Check-In   Supervising physician immediately available to respond to emergencies [facility] immediately available    Physician(s) Josefa Beauvais, NP    [facility]    Staff Present Ronal Levin, RN, BSN;Oasis Goehring Claudene, RT;Randi Midge BS, ACSM-CEP, Exercise Physiologist;Kaylee Nicholaus, MS, ACSM-CEP, Exercise Physiologist    Virtual Visit No    Medication changes reported     No    Fall or balance concerns reported    No    Tobacco Cessation No Change    Warm-up and Cool-down Performed as group-led instruction    Resistance Training Performed Yes    VAD Patient? No    PAD/SET Patient? No      Pain Assessment   Currently in Pain? No/denies    Multiple Pain Sites No          Capillary Blood Glucose: No results found for this or any previous visit (from the past 24 hours).    Social History   Tobacco Use  Smoking Status Former   Current packs/day: 0.00   Average packs/day: 3.0 packs/day for 20.0 years (60.0 ttl pk-yrs)   Types: Cigarettes   Start date: 04/25/1949   Quit date: 04/25/1969   Years since quitting: 54.7  Smokeless Tobacco Former   Types: Chew   Quit date: 11/07/1983    Goals Met:  Proper associated with RPD/PD & O2 Sat Independence with exercise equipment Exercise tolerated well No report of concerns or symptoms today Strength training completed today  Goals Unmet:  Not Applicable  Comments: Service time is from 1012 to 1145.    Dr. Slater Staff is Medical Director for Pulmonary Rehab at [facility].

## 2024-01-03 NOTE — Telephone Encounter (Signed)
 Spoke w/ PT and wife [facility]    -NFN

## 2024-01-04 ENCOUNTER — Encounter (HOSPITAL_COMMUNITY)
Admission: RE | Admit: 2024-01-04 | Discharge: 2024-01-04 | Disposition: A | Discharge status: Home / Self Care | Source: Ambulatory Visit | Attending: Pulmonary Disease | Admitting: Pulmonary Disease

## 2024-01-04 DIAGNOSIS — J8410 Pulmonary fibrosis, unspecified: Secondary | ICD-10-CM | POA: Diagnosis not present

## 2024-01-04 DIAGNOSIS — I350 Nonrheumatic aortic (valve) stenosis: Secondary | ICD-10-CM | POA: Diagnosis not present

## 2024-01-04 NOTE — Progress Notes (Signed)
 Daily Session Note  Patient Details  Name: Alex [name]. MRN: [id number] Date of Birth: 1941-02-17 Referring Provider:   Conrad Ports Pulmonary Rehab Walk Test from 12/20/2023 in [facility] for Heart, Vascular, & Lung Health  Referring Provider Dr. Kara    Encounter Date: 01/04/2024  Check In:  Session Check In - 01/04/24 1034       Check-In   Supervising physician immediately available to respond to emergencies [facility] immediately available    Physician(s) Orren Fabry, PA    [facility]    Staff Present Ronal Levin, RN, BSN;Casey Claudene, RT;Nini Cavan Midge BS, ACSM-CEP, Exercise Physiologist;Kaylee Nicholaus, MS, ACSM-CEP, Exercise Physiologist    Virtual Visit No    Medication changes reported     No    Fall or balance concerns reported    No    Tobacco Cessation No Change    Warm-up and Cool-down Performed as group-led instruction    Resistance Training Performed Yes    VAD Patient? No    PAD/SET Patient? No      Pain Assessment   Currently in Pain? No/denies          Capillary Blood Glucose: No results found for this or any previous visit (from the past 24 hours).    Social History   Tobacco Use  Smoking Status Former   Current packs/day: 0.00   Average packs/day: 3.0 packs/day for 20.0 years (60.0 ttl pk-yrs)   Types: Cigarettes   Start date: 04/25/1949   Quit date: 04/25/1969   Years since quitting: 54.7  Smokeless Tobacco Former   Types: Chew   Quit date: 11/07/1983    Goals Met:  Exercise tolerated well No report of concerns or symptoms today Strength training completed today  Goals Unmet:  Not Applicable  Comments: Service time is from 1009 to 1147.    Dr. Slater Staff is Medical Director for Pulmonary Rehab at [facility].

## 2024-01-09 ENCOUNTER — Encounter (HOSPITAL_COMMUNITY)
Admission: RE | Admit: 2024-01-09 | Discharge: 2024-01-09 | Disposition: A | Discharge status: Home / Self Care | Source: Ambulatory Visit | Attending: Pulmonary Disease | Admitting: Pulmonary Disease

## 2024-01-09 VITALS — Wt 183.2 lb

## 2024-01-09 DIAGNOSIS — J8410 Pulmonary fibrosis, unspecified: Secondary | ICD-10-CM

## 2024-01-09 DIAGNOSIS — I350 Nonrheumatic aortic (valve) stenosis: Secondary | ICD-10-CM | POA: Diagnosis not present

## 2024-01-09 NOTE — Progress Notes (Signed)
 Daily Session Note  Patient Details  Name: Alex [name]. MRN: [id number] Date of Birth: 11-02-1940 Referring Provider:   Conrad Ports Pulmonary Rehab Walk Test from 12/20/2023 in [facility] for Heart, Vascular, & Lung Health  Referring Provider Dr. Kara    Encounter Date: 01/09/2024  Check In:  Session Check In - 01/09/24 1127       Check-In   Supervising physician immediately available to respond to emergencies [facility] immediately available    Physician(s) Rosabel Mose, NP    [facility]    Staff Present Ronal Levin, RN, BSN;Casey Claudene, RT;Keylie Beavers [place] BS, ACSM-CEP, Exercise Physiologist;Kaylee Nicholaus, MS, ACSM-CEP, Exercise Physiologist;Joseph Lennon, RN, BSN    Virtual Visit No    Medication changes reported     No    Fall or balance concerns reported    No    Tobacco Cessation No Change    Warm-up and Cool-down Performed as group-led instruction    Resistance Training Performed Yes    VAD Patient? No    PAD/SET Patient? No      Pain Assessment   Currently in Pain? No/denies    Multiple Pain Sites No          Capillary Blood Glucose: No results found for this or any previous visit (from the past 24 hours).   Exercise Prescription Changes - 01/09/24 1200       Response to Exercise   Blood Pressure (Admit) 118/66    Blood Pressure (Exercise) 108/70    Blood Pressure (Exit) 118/86    Heart Rate (Admit) 86 bpm    Heart Rate (Exercise) 88 bpm    Heart Rate (Exit) 79 bpm    Oxygen  Saturation (Admit) 96 %    Oxygen  Saturation (Exercise) 93 %    Oxygen  Saturation (Exit) 95 %    Rating of Perceived Exertion (Exercise) 15    Perceived Dyspnea (Exercise) 3    Duration Continue with 30 min of aerobic exercise without signs/symptoms of physical distress.    Intensity THRR unchanged      Progression   Progression Continue to progress workloads to maintain intensity without signs/symptoms of physical distress.       Resistance Training   Training Prescription Yes    Weight red bands    Reps 10-15    Time 10 Minutes      NuStep   Level 2    SPM 76    Minutes 15    METs 2      Track   Laps 8    Minutes 15    METs 1.92          Social History   Tobacco Use  Smoking Status Former   Current packs/day: 0.00   Average packs/day: 3.0 packs/day for 20.0 years (60.0 ttl pk-yrs)   Types: Cigarettes   Start date: 04/25/1949   Quit date: 04/25/1969   Years since quitting: 54.7  Smokeless Tobacco Former   Types: Chew   Quit date: 11/07/1983    Goals Met:  Exercise tolerated well No report of concerns or symptoms today Strength training completed today  Goals Unmet:  Not Applicable  Comments: Service time is from 1008 to 1134.    Dr. Slater Staff is Medical Director for Pulmonary Rehab at [facility].

## 2024-01-11 ENCOUNTER — Encounter (HOSPITAL_COMMUNITY)
Admission: RE | Admit: 2024-01-11 | Discharge: 2024-01-11 | Disposition: A | Discharge status: Home / Self Care | Source: Ambulatory Visit | Attending: Pulmonary Disease

## 2024-01-11 DIAGNOSIS — J8410 Pulmonary fibrosis, unspecified: Secondary | ICD-10-CM

## 2024-01-11 DIAGNOSIS — I350 Nonrheumatic aortic (valve) stenosis: Secondary | ICD-10-CM | POA: Diagnosis not present

## 2024-01-11 NOTE — Progress Notes (Signed)
 Daily Session Note  Patient Details  Name: Alex [name]. MRN: [id number] Date of Birth: 1940-08-30 Referring Provider:   Conrad Ports Pulmonary Rehab Walk Test from 12/20/2023 in [facility] for Heart, Vascular, & Lung Health  Referring Provider Dr. Kara    Encounter Date: 01/11/2024  Check In:  Session Check In - 01/11/24 1042       Check-In   Supervising physician immediately available to respond to emergencies [facility] immediately available    Physician(s) Damien Braver, NP    [facility]    Staff Present Ronal Levin, RN, BSN;Casey Claudene, RT;Randi [facility] BS, ACSM-CEP, Exercise Physiologist;Kathleen Likins Nicholaus, MS, ACSM-CEP, Exercise Physiologist    Virtual Visit No    Medication changes reported     No    Fall or balance concerns reported    No    Tobacco Cessation No Change    Warm-up and Cool-down Performed as group-led instruction    Resistance Training Performed Yes    VAD Patient? No    PAD/SET Patient? No      Pain Assessment   Currently in Pain? No/denies          Capillary Blood Glucose: No results found for this or any previous visit (from the past 24 hours).    Social History   Tobacco Use  Smoking Status Former   Current packs/day: 0.00   Average packs/day: 3.0 packs/day for 20.0 years (60.0 ttl pk-yrs)   Types: Cigarettes   Start date: 04/25/1949   Quit date: 04/25/1969   Years since quitting: 54.7  Smokeless Tobacco Former   Types: Chew   Quit date: 11/07/1983    Goals Met:  Proper associated with RPD/PD & O2 Sat Exercise tolerated well No report of concerns or symptoms today Strength training completed today  Goals Unmet:  Not Applicable  Comments: Service time is from 1016 to 1147.    Dr. Slater Staff is Medical Director for Pulmonary Rehab at [facility].

## 2024-01-16 ENCOUNTER — Encounter (HOSPITAL_COMMUNITY)
Admission: RE | Admit: 2024-01-16 | Discharge: 2024-01-16 | Disposition: A | Discharge status: Home / Self Care | Source: Ambulatory Visit | Attending: Pulmonary Disease | Admitting: Pulmonary Disease

## 2024-01-16 ENCOUNTER — Ambulatory Visit (HOSPITAL_COMMUNITY)
Admission: RE | Admit: 2024-01-16 | Discharge: 2024-01-16 | Disposition: A | Discharge status: Home / Self Care | Source: Ambulatory Visit | Attending: Cardiovascular Disease | Admitting: Cardiovascular Disease

## 2024-01-16 DIAGNOSIS — J8410 Pulmonary fibrosis, unspecified: Secondary | ICD-10-CM | POA: Diagnosis not present

## 2024-01-16 DIAGNOSIS — I350 Nonrheumatic aortic (valve) stenosis: Secondary | ICD-10-CM | POA: Diagnosis not present

## 2024-01-16 LAB — ECHOCARDIOGRAM COMPLETE
AR max vel: 0.85 cm2
AV Area VTI: 0.82 cm2
AV Area mean vel: 0.75 cm2
AV Mean grad: 19 mmHg
AV Peak grad: 28.9 mmHg
Ao pk vel: 2.69 m/s
Area-P 1/2: 1.82 cm2
P 1/2 time: 505 ms
S' Lateral: 2.3 cm

## 2024-01-16 NOTE — Progress Notes (Signed)
 Daily Session Note  Patient Details  Name: Alex [name]. MRN: [id number] Date of Birth: 24-Dec-1940 Referring Provider:   Conrad Ports Pulmonary Rehab Walk Test from 12/20/2023 in [facility] for Heart, Vascular, & Lung Health  Referring Provider Dr. Kara    Encounter Date: 01/16/2024  Check In:  Session Check In - 01/16/24 1052       Check-In   Supervising physician immediately available to respond to emergencies [facility] MD immediately available    Physician(s) Damien Braver, NP    [facility]    Staff Present Ronal Levin, RN, BSN;Casey Claudene, RT;Randi [facility] BS, ACSM-CEP, Exercise Physiologist;Kaylee Nicholaus, MS, ACSM-CEP, Exercise Physiologist    Virtual Visit No    Medication changes reported     No    Fall or balance concerns reported    No    Tobacco Cessation No Change    Warm-up and Cool-down Performed as group-led instruction    Resistance Training Performed Yes    VAD Patient? No    PAD/SET Patient? No      Pain Assessment   Currently in Pain? No/denies          Capillary Blood Glucose: No results found for this or any previous visit (from the past 24 hours).    Social History   Tobacco Use  Smoking Status Former   Current packs/day: 0.00   Average packs/day: 3.0 packs/day for 20.0 years (60.0 ttl pk-yrs)   Types: Cigarettes   Start date: 04/25/1949   Quit date: 04/25/1969   Years since quitting: 54.7  Smokeless Tobacco Former   Types: Chew   Quit date: 11/07/1983    Goals Met:  Exercise tolerated well No report of concerns or symptoms today Strength training completed today  Goals Unmet:  Not Applicable  Comments: Service time is from 1014 to 1141    Dr. Slater Staff is Medical Director for Pulmonary Rehab at [facility].

## 2024-01-17 ENCOUNTER — Ambulatory Visit: Payer: Self-pay | Admitting: Cardiovascular Disease

## 2024-01-17 DIAGNOSIS — E8581 Light chain (AL) amyloidosis: Secondary | ICD-10-CM | POA: Diagnosis not present

## 2024-01-17 DIAGNOSIS — Z86711 Personal history of pulmonary embolism: Secondary | ICD-10-CM | POA: Diagnosis not present

## 2024-01-17 DIAGNOSIS — I209 Angina pectoris, unspecified: Secondary | ICD-10-CM | POA: Diagnosis not present

## 2024-01-17 DIAGNOSIS — Z8546 Personal history of malignant neoplasm of prostate: Secondary | ICD-10-CM | POA: Diagnosis not present

## 2024-01-17 DIAGNOSIS — I129 Hypertensive chronic kidney disease with stage 1 through stage 4 chronic kidney disease, or unspecified chronic kidney disease: Secondary | ICD-10-CM | POA: Diagnosis not present

## 2024-01-17 DIAGNOSIS — D849 Immunodeficiency, unspecified: Secondary | ICD-10-CM | POA: Diagnosis not present

## 2024-01-17 DIAGNOSIS — N1831 Chronic kidney disease, stage 3a: Secondary | ICD-10-CM | POA: Diagnosis not present

## 2024-01-17 DIAGNOSIS — I350 Nonrheumatic aortic (valve) stenosis: Secondary | ICD-10-CM | POA: Diagnosis not present

## 2024-01-18 ENCOUNTER — Encounter (HOSPITAL_COMMUNITY)
Admission: RE | Admit: 2024-01-18 | Discharge: 2024-01-18 | Disposition: A | Discharge status: Home / Self Care | Source: Ambulatory Visit | Attending: Pulmonary Disease | Admitting: Pulmonary Disease

## 2024-01-18 DIAGNOSIS — J8410 Pulmonary fibrosis, unspecified: Secondary | ICD-10-CM | POA: Diagnosis not present

## 2024-01-18 DIAGNOSIS — I350 Nonrheumatic aortic (valve) stenosis: Secondary | ICD-10-CM | POA: Diagnosis not present

## 2024-01-18 NOTE — Progress Notes (Signed)
 Daily Session Note  Patient Details  Name: Alex [name]. MRN: [id number] Date of Birth: Aug 15, 1940 Referring Provider:   Conrad Ports Pulmonary Rehab Walk Test from 12/20/2023 in [facility] for Heart, Vascular, & Lung Health  Referring Provider Dr. Kara    Encounter Date: 01/18/2024  Check In:  Session Check In - 01/18/24 1038       Check-In   Supervising physician immediately available to respond to emergencies [facility] MD immediately available    Physician(s) Lum Louis, NP    Location [facility]    Staff Present Ronal Levin, RN, BSN;Casey Claudene, RT;Cyndy Braver [place] BS, ACSM-CEP, Exercise Physiologist;David Lealman, MS, ACSM-CEP, CCRP, Exercise Physiologist    Virtual Visit No    Medication changes reported     No    Fall or balance concerns reported    No    Tobacco Cessation No Change    Warm-up and Cool-down Performed as group-led instruction    Resistance Training Performed Yes    VAD Patient? No    PAD/SET Patient? No      Pain Assessment   Currently in Pain? No/denies    Multiple Pain Sites No          Capillary Blood Glucose: No results found for this or any previous visit (from the past 24 hours).    Social History   Tobacco Use  Smoking Status Former   Current packs/day: 0.00   Average packs/day: 3.0 packs/day for 20.0 years (60.0 ttl pk-yrs)   Types: Cigarettes   Start date: 04/25/1949   Quit date: 04/25/1969   Years since quitting: 54.7  Smokeless Tobacco Former   Types: Chew   Quit date: 11/07/1983    Goals Met:  Exercise tolerated well No report of concerns or symptoms today Strength training completed today  Goals Unmet:  Not Applicable  Comments: Service time is from 1019 to 1149.    Dr. Slater Staff is Medical Director for Pulmonary Rehab at [facility].

## 2024-01-23 ENCOUNTER — Encounter (HOSPITAL_COMMUNITY)
Admission: RE | Admit: 2024-01-23 | Discharge: 2024-01-23 | Disposition: A | Discharge status: Home / Self Care | Source: Ambulatory Visit | Attending: Pulmonary Disease | Admitting: Pulmonary Disease

## 2024-01-23 VITALS — Wt 184.3 lb

## 2024-01-23 DIAGNOSIS — J8410 Pulmonary fibrosis, unspecified: Secondary | ICD-10-CM

## 2024-01-23 DIAGNOSIS — I350 Nonrheumatic aortic (valve) stenosis: Secondary | ICD-10-CM | POA: Diagnosis not present

## 2024-01-23 NOTE — Progress Notes (Signed)
 Daily Session Note  Patient Details  Name: Alex [name]. MRN: [id number] Date of Birth: 1940-10-23 Referring Provider:   Conrad Ports Pulmonary Rehab Walk Test from 12/20/2023 in [facility] for Heart, Vascular, & Lung Health  Referring Provider Dr. Kara    Encounter Date: 01/23/2024  Check In:  Session Check In - 01/23/24 1150       Check-In   Supervising physician immediately available to respond to emergencies [facility] MD immediately available    Physician(s) Rosaline Bane, NP    [facility]    Staff Present Ronal Levin, RN, BSN;Nahlia Hellmann Claudene, Neita Moats, MS, ACSM-CEP, Exercise Physiologist;Carlette Bernett, RN, BSN    Virtual Visit No    Medication changes reported     No    Fall or balance concerns reported    No    Tobacco Cessation No Change    Warm-up and Cool-down Performed as group-led instruction    Resistance Training Performed Yes    VAD Patient? No    PAD/SET Patient? No      Pain Assessment   Currently in Pain? No/denies    Pain Score 0-No pain    Multiple Pain Sites No          Capillary Blood Glucose: No results found for this or any previous visit (from the past 24 hours).   Exercise Prescription Changes - 01/23/24 1200       Response to Exercise   Blood Pressure (Admit) 130/68    Blood Pressure (Exercise) 142/80    Blood Pressure (Exit) 126/70    Heart Rate (Admit) 70 bpm    Heart Rate (Exercise) 93 bpm    Heart Rate (Exit) 77 bpm    Oxygen  Saturation (Admit) 95 %    Oxygen  Saturation (Exercise) 94 %    Oxygen  Saturation (Exit) 96 %    Rating of Perceived Exertion (Exercise) 16    Perceived Dyspnea (Exercise) 3    Duration Continue with 30 min of aerobic exercise without signs/symptoms of physical distress.    Intensity THRR unchanged      Progression   Progression Continue to progress workloads to maintain intensity without signs/symptoms of physical distress.      Resistance  Training   Training Prescription Yes    Weight red bands    Reps 10-15    Time 10 Minutes      NuStep   Level 2    Minutes 15    METs 2.3      Track   Laps 8.5    Minutes 15    METs 1.92          Social History   Tobacco Use  Smoking Status Former   Current packs/day: 0.00   Average packs/day: 3.0 packs/day for 20.0 years (60.0 ttl pk-yrs)   Types: Cigarettes   Start date: 04/25/1949   Quit date: 04/25/1969   Years since quitting: 54.7  Smokeless Tobacco Former   Types: Chew   Quit date: 11/07/1983    Goals Met:  Proper associated with RPD/PD & O2 Sat Independence with exercise equipment Exercise tolerated well No report of concerns or symptoms today Strength training completed today  Goals Unmet:  Not Applicable  Comments: Service time is from 1017 to 1139.    Dr. Slater Staff is Medical Director for Pulmonary Rehab at [facility].

## 2024-01-24 NOTE — Progress Notes (Signed)
 Pulmonary Individual Treatment Plan  Patient Details  Name: Alex [name]. MRN: [id number] Date of Birth: 18-Jan-1941 Referring Provider:   Conrad Ports Pulmonary Rehab Walk Test from 12/20/2023 in [facility] for Heart, Vascular, & Lung Health  Referring Provider Dr. Kara    Initial Encounter Date:  Flowsheet Row Pulmonary Rehab Walk Test from 12/20/2023 in [facility] for Heart, Vascular, & Lung Health  Date 12/20/23    Visit Diagnosis: Pulmonary fibrosis (HCC)  Patient's Home Medications on Admission:   Current Outpatient Medications:    albuterol  (ACCUNEB ) 1.25 MG/3ML nebulizer solution, Take 1 ampule by nebulization every 6 (six) hours as needed for shortness of breath or wheezing., Disp: , Rfl:    apixaban  (ELIQUIS ) 5 MG TABS tablet, Take 5 mg by mouth 2 (two) times daily., Disp: , Rfl:    Ascorbic Acid  (VITAMIN C ) 1000 MG tablet, Take 1,000 mg by mouth daily., Disp: , Rfl:    atorvastatin  (LIPITOR) 40 MG tablet, Take 40 mg by mouth daily., Disp: , Rfl:    capsaicin (ZOSTRIX) 0.025 % cream, Apply 1 Application topically as needed (aches and pains)., Disp: , Rfl:    carvedilol  (COREG ) 6.25 MG tablet, Take 3.125-6.25 mg by mouth See admin instructions. 6.25 mg in the morning, 3.125 mg in the evening, Disp: , Rfl:    Cholecalciferol  125 MCG (5000 UT) TABS, Take 5,000 Units by mouth 2 (two) times a day., Disp: , Rfl:    diclofenac Sodium (VOLTAREN) 1 % GEL, Apply topically., Disp: , Rfl:    ezetimibe  (ZETIA ) 10 MG tablet, Take 10 mg by mouth daily., Disp: , Rfl:    fluticasone  (FLONASE ) 50 MCG/ACT nasal spray, Place 2 sprays into both nostrils daily., Disp: 16 g, Rfl: 5   furosemide  (LASIX ) 40 MG tablet, Take 40 mg by mouth daily. , Disp: , Rfl:    Ipratropium-Albuterol  (COMBIVENT RESPIMAT) 20-100 MCG/ACT AERS respimat, Inhale 1 puff into the lungs every 6 (six) hours as needed for wheezing or shortness of breath., Disp: , Rfl:     isosorbide  mononitrate (IMDUR ) 30 MG 24 hr tablet, Take 30 mg by mouth daily., Disp: , Rfl:    lidocaine  (LIDODERM ) 5 %, Place 1 patch onto the skin daily as needed (pain)., Disp: , Rfl:    Multiple Vitamin (MULTI-VITAMINS) TABS, Take 1 tablet by mouth at bedtime., Disp: , Rfl:    mupirocin  ointment (BACTROBAN ) 2 %, Apply 1 Application topically daily as needed (wound care)., Disp: , Rfl:    nitroGLYCERIN  (NITROSTAT ) 0.4 MG SL tablet, DISSOLVE ONE TABLET UNDER TONGUE EVERY 5 MINUTES AS NEEDED UP TO 3 DOSES, IF NORELIEF CALL 911, Disp: 25 tablet, Rfl: 11   pantoprazole  (PROTONIX ) 40 MG tablet, Take 1 tablet (40 mg total) by mouth 2 (two) times daily., Disp: 180 tablet, Rfl: 1   potassium chloride  SA (KLOR-CON ) 20 MEQ tablet, Take 20 mEq by mouth daily., Disp: , Rfl:    Probiotic Product (PROBIOTIC-10 PO), Take 1 capsule by mouth daily., Disp: , Rfl:    tamsulosin  (FLOMAX ) 0.4 MG CAPS capsule, Take 0.4 mg by mouth 2 (two) times daily., Disp: , Rfl:    triamcinolone  cream (KENALOG ) 0.1 %, Apply 1 Application topically daily as needed (irritation)., Disp: , Rfl:    vitamin B-12 (CYANOCOBALAMIN) 250 MCG tablet, Take 500 mcg by mouth daily., Disp: , Rfl:    zinc gluconate 50 MG tablet, Take 50 mg by mouth daily., Disp: , Rfl:   Past  Medical History: Past Medical History:  Diagnosis Date   AL amyloidosis [facility]) oncologist-- dr onesimo   Renal and Systemic , dx 04/ 2018;  renal bx 08-05-2016 and bone marrow bx 08-29-2016;  8 month chemotherapy then stell cell transplant 12/ 2018 then chemo again;  since then been on oral chemo daily (11-07-2019 per pt has been on hold until after prostate seed implants)   Arthritis    Atypical chest pain    CAD (coronary artery disease) cardiologist-- dr verlin;  also [facility] [place], dr d. sarah   moderate nonobstrucive disease by cath 2011; Lexiscan  Myoview  6/14--Normal study, no scar or ischemia, EF 62%;  11-14-2014 nuclear study low  risk w/ normal perfusion, nuclear ef 67%   CKD (chronic kidney disease), stage III (HCC)    COVID    b/l PNA (7/20)- 21 day hospitalization tx with steroids, Remdesevir, Actemra , Hydroxychloroquine ) --> PE on Eliquis .   Diverticulosis of colon    Dyspnea    WITH EXERTION    ED (erectile dysfunction)    Fatty liver    GERD (gastroesophageal reflux disease)    H/O stem cell transplant (HCC) 03/2017   Headache    Heart murmur    Hiatal hernia    History of 2019 novel coronavirus disease (COVID-19) 09/2018   [facility] 06[facility], hospital admission 10-28-2019 respiratory failure/ pneumometrostinom/ PE ;  was not intubated , treatment with remdesivir / steroids/ actemra / plasma   History of basal cell carcinoma (BCC)    History of colon polyps    History of MRSA infection    culture positive   History of pulmonary embolus (PE) 10/2018   with covid 19, completed treatment with eliquis    History of seizures    11-07-2019  per pt last one 1970s, unknown cause   History of vertebral compression fracture 1988   lumbar   Hx of adenomatous colonic polyps    negative in 7/08   Hyperlipidemia    Hypertension    IFG (impaired fasting glucose)    Neuropathy due to chemotherapeutic drug    fingers tips and feet   Nocturia    On supplemental oxygen  therapy    post covid 19;   11-07-2019 pt uses O2 every night 2L via [place],  and uses supplements during the day when walking around neighborhood and goes out to appointments/ store that he has to walk a distance   OSA (obstructive sleep apnea)    study in epic 03-18-2005 mild to moderate   (11-07-2019  per pt used cpap up until when he lost alot of weight, stoped approx. 2012   Pneumonia    Prostate cancer [facility]) urologist--- dr pace/  oncology-- dr patrcia   dx 03/ 2021   Pulmonary fibrosis (HCC) 09/2018   pulmonologist-- dr theophilus;   post covid 19   Seizure (HCC)    1st in 1976; last in 1978 GTC x 2    SOB (shortness of breath) on  exertion    11-07-2019  per pt walks around neighborhood for 30 minutes twice daily, he does have to stop frequently d/t sob and since weather has been hot he uses his supplemental oxygen ;  stated he has a pulse ox. at home , when he is sitting down O2 sat 96 -97% on RA when up walking around the house O2 sat 93-94% on RA;  pt stated he carry's his O2 w/ him when he goes to doctor appts/store d/t distance  Wears glasses    Wears hearing aid in both ears     Tobacco Use: Social History   Tobacco Use  Smoking Status Former   Current packs/day: 0.00   Average packs/day: 3.0 packs/day for 20.0 years (60.0 ttl pk-yrs)   Types: Cigarettes   Start date: 04/25/1949   Quit date: 04/25/1969   Years since quitting: 54.7  Smokeless Tobacco Former   Types: Chew   Quit date: 11/07/1983    Labs: Review Flowsheet  More data exists      Latest Ref Rng & Units 10/29/2018 11/08/2018 09/24/2020 11/03/2020 06/28/2022  Labs for ITP Cardiac and Pulmonary Rehab  Trlycerides <150 mg/dL 896  - - - -  Hemoglobin A1c 4.8 - 5.6 % - - 6.0  6.5  5.5   PH, Arterial 7.350 - 7.450 - 7.528  - - -  PCO2 arterial 32.0 - 48.0 mmHg - 27.2  - - -  Bicarbonate 20.0 - 28.0 mmol/L - 22.7  - - -  TCO2 22 - 32 mmol/L - 24  - - -  O2 Saturation % - 90.0  - - -    Capillary Blood Glucose: Lab Results  Component Value Date   GLUCAP 119 (H) 06/28/2022   GLUCAP 113 (H) 11/09/2018   GLUCAP 143 (H) 11/08/2018   GLUCAP 115 (H) 11/08/2018   GLUCAP 153 (H) 11/08/2018     Pulmonary Assessment Scores:  Pulmonary Assessment Scores     Row Name 12/20/23 1439         ADL UCSD   ADL Phase Entry     SOB Score total 84       CAT Score   CAT Score 31       mMRC Score   mMRC Score 4       UCSD: Self-administered rating of dyspnea associated with activities of daily living (ADLs) 6-point scale (0 = not at all to 5 = maximal or unable to do because of breathlessness)  Scoring Scores range from 0 to 120.  Minimally  important difference is 5 units  CAT: CAT can identify the health impairment of COPD patients and is better correlated with disease progression.  CAT has a scoring range of zero to 40. The CAT score is classified into four groups of low (less than 10), medium (10 - 20), high (21-30) and very high (31-40) based on the impact level of disease on health status. A CAT score over 10 suggests significant symptoms.  A worsening CAT score could be explained by an exacerbation, poor medication adherence, poor inhaler technique, or progression of COPD or comorbid conditions.  CAT MCID is 2 points  mMRC: mMRC (Modified Medical Research Council) Dyspnea Scale is used to assess the degree of baseline functional disability in patients of respiratory disease due to dyspnea. No minimal important difference is established. A decrease in score of 1 point or greater is considered a positive change.   Pulmonary Function Assessment:  Pulmonary Function Assessment - 12/20/23 1358       Breath   Bilateral Breath Sounds Decreased    Shortness of Breath Yes;Limiting activity          Exercise Target Goals: Exercise Program Goal: Individual exercise prescription set using results from initial 6 min walk test and THRR while considering  patient's activity barriers and safety.   Exercise Prescription Goal: Initial exercise prescription builds to 30-45 minutes a day of aerobic activity, 2-3 days per week.  Home exercise guidelines will be given  to patient during program as part of exercise prescription that the participant will acknowledge.  Activity Barriers & Risk Stratification:  Activity Barriers & Cardiac Risk Stratification - 12/20/23 1353       Activity Barriers & Cardiac Risk Stratification   Activity Barriers Deconditioning;Muscular Weakness;Shortness of Breath;Balance Concerns    Cardiac Risk Stratification Moderate          6 Minute Walk:  6 Minute Walk     Row Name 12/20/23 1445          6 Minute Walk   Phase Initial     Distance 629 feet     Walk Time 6 minutes     # of Rest Breaks 1  4:31-5:13     MPH 1.19     METS 1.08     RPE 15     Perceived Dyspnea  4     VO2 Peak 3.79     Symptoms No     Resting HR 75 bpm     Resting BP 108/60     Resting Oxygen  Saturation  95 %     Exercise Oxygen  Saturation  during 6 min walk 94 %     Max Ex. HR 75 bpm     Max Ex. BP 126/70     2 Minute Post BP 120/70       Interval HR   1 Minute HR 73     2 Minute HR 74     3 Minute HR 71     4 Minute HR 71     5 Minute HR 72     6 Minute HR 66     2 Minute Post HR 62     Interval Heart Rate? Yes       Interval Oxygen    Interval Oxygen ? Yes     Baseline Oxygen  Saturation % 95 %     1 Minute Oxygen  Saturation % 96 %     1 Minute Liters of Oxygen  0 L     2 Minute Oxygen  Saturation % 96 %     2 Minute Liters of Oxygen  0 L     3 Minute Oxygen  Saturation % 95 %     3 Minute Liters of Oxygen  0 L     4 Minute Oxygen  Saturation % 95 %     4 Minute Liters of Oxygen  0 L     5 Minute Oxygen  Saturation % 94 %     5 Minute Liters of Oxygen  0 L     6 Minute Oxygen  Saturation % 95 %     6 Minute Liters of Oxygen  0 L     2 Minute Post Oxygen  Saturation % 95 %     2 Minute Post Liters of Oxygen  0 L        Oxygen  Initial Assessment:  Oxygen  Initial Assessment - 12/20/23 1355       Home Oxygen    Home Oxygen  Device Home Concentrator;Liquid Oxygen ;E-Tanks    Sleep Oxygen  Prescription CPAP    Liters per minute 2    Home Exercise Oxygen  Prescription Continuous   only wears when he feels like he needs it   Liters per minute 2    Home Resting Oxygen  Prescription Continuous   only wears when he feels like he needs it   Liters per minute 2    Compliance with Home Oxygen  Use Yes      Initial 6 min Walk   Oxygen  Used None  Program Oxygen  Prescription   Program Oxygen  Prescription None      Intervention   Short Term Goals To learn and understand importance of monitoring SPO2  with pulse oximeter and demonstrate accurate use of the pulse oximeter.;To learn and understand importance of maintaining oxygen  saturations>88%;To learn and demonstrate proper pursed lip breathing techniques or other breathing techniques. ;To learn and demonstrate proper use of respiratory medications;To learn and exhibit compliance with exercise, home and travel O2 prescription    Long  Term Goals Maintenance of O2 saturations>88%;Compliance with respiratory medication;Verbalizes importance of monitoring SPO2 with pulse oximeter and return demonstration;Exhibits proper breathing techniques, such as pursed lip breathing or other method taught during program session;Demonstrates proper use of MDI's;Exhibits compliance with exercise, home  and travel O2 prescription          Oxygen  Re-Evaluation:  Oxygen  Re-Evaluation     Row Name 01/15/24 0919             Program Oxygen  Prescription   Program Oxygen  Prescription None         Home Oxygen    Home Oxygen  Device Home Concentrator;Liquid Oxygen ;E-Tanks       Sleep Oxygen  Prescription CPAP       Liters per minute 2       Home Exercise Oxygen  Prescription Continuous  only wears when he feels like he needs it       Liters per minute 2       Home Resting Oxygen  Prescription Continuous  only wears when he feels like he needs it       Liters per minute 2       Compliance with Home Oxygen  Use Yes         Goals/Expected Outcomes   Short Term Goals To learn and understand importance of monitoring SPO2 with pulse oximeter and demonstrate accurate use of the pulse oximeter.;To learn and understand importance of maintaining oxygen  saturations>88%;To learn and demonstrate proper pursed lip breathing techniques or other breathing techniques. ;To learn and demonstrate proper use of respiratory medications;To learn and exhibit compliance with exercise, home and travel O2 prescription       Long  Term Goals Maintenance of O2 saturations>88%;Compliance with  respiratory medication;Verbalizes importance of monitoring SPO2 with pulse oximeter and return demonstration;Exhibits proper breathing techniques, such as pursed lip breathing or other method taught during program session;Demonstrates proper use of MDI's;Exhibits compliance with exercise, home  and travel O2 prescription       Goals/Expected Outcomes Compliance and understanding of oxygen  saturation monitoring and breathing techniques to decrease shortness of breath.          Oxygen  Discharge (Final Oxygen  Re-Evaluation):  Oxygen  Re-Evaluation - 01/15/24 0919       Program Oxygen  Prescription   Program Oxygen  Prescription None      Home Oxygen    Home Oxygen  Device Home Concentrator;Liquid Oxygen ;E-Tanks    Sleep Oxygen  Prescription CPAP    Liters per minute 2    Home Exercise Oxygen  Prescription Continuous   only wears when he feels like he needs it   Liters per minute 2    Home Resting Oxygen  Prescription Continuous   only wears when he feels like he needs it   Liters per minute 2    Compliance with Home Oxygen  Use Yes      Goals/Expected Outcomes   Short Term Goals To learn and understand importance of monitoring SPO2 with pulse oximeter and demonstrate accurate use of the pulse oximeter.;To learn and understand importance of maintaining  oxygen  saturations>88%;To learn and demonstrate proper pursed lip breathing techniques or other breathing techniques. ;To learn and demonstrate proper use of respiratory medications;To learn and exhibit compliance with exercise, home and travel O2 prescription    Long  Term Goals Maintenance of O2 saturations>88%;Compliance with respiratory medication;Verbalizes importance of monitoring SPO2 with pulse oximeter and return demonstration;Exhibits proper breathing techniques, such as pursed lip breathing or other method taught during program session;Demonstrates proper use of MDI's;Exhibits compliance with exercise, home  and travel O2 prescription     Goals/Expected Outcomes Compliance and understanding of oxygen  saturation monitoring and breathing techniques to decrease shortness of breath.          Initial Exercise Prescription:  Initial Exercise Prescription - 12/20/23 1400       Date of Initial Exercise RX and Referring Provider   Date 12/20/23    Referring Provider Dr. Kara    Expected Discharge Date 03/19/24      NuStep   Level 1    SPM 75    Minutes 15    METs 1.8      Track   Laps 6    Minutes 15    METs 1.9      Prescription Details   Frequency (times per week) 2    Duration Progress to 30 minutes of continuous aerobic without signs/symptoms of physical distress      Intensity   THRR 40-80% of Max Heartrate 56-112    Ratings of Perceived Exertion 11-13    Perceived Dyspnea 0-4      Progression   Progression Continue to progress workloads to maintain intensity without signs/symptoms of physical distress.      Resistance Training   Training Prescription Yes    Weight red bands    Reps 10-15          Perform Capillary Blood Glucose checks as needed.  Exercise Prescription Changes:   Exercise Prescription Changes     Row Name 01/09/24 1200 01/23/24 1200           Response to Exercise   Blood Pressure (Admit) 118/66 130/68      Blood Pressure (Exercise) 108/70 142/80      Blood Pressure (Exit) 118/86 126/70      Heart Rate (Admit) 86 bpm 70 bpm      Heart Rate (Exercise) 88 bpm 93 bpm      Heart Rate (Exit) 79 bpm 77 bpm      Oxygen  Saturation (Admit) 96 % 95 %      Oxygen  Saturation (Exercise) 93 % 94 %      Oxygen  Saturation (Exit) 95 % 96 %      Rating of Perceived Exertion (Exercise) 15 16      Perceived Dyspnea (Exercise) 3 3      Duration Continue with 30 min of aerobic exercise without signs/symptoms of physical distress. Continue with 30 min of aerobic exercise without signs/symptoms of physical distress.      Intensity THRR unchanged THRR unchanged        Progression    Progression Continue to progress workloads to maintain intensity without signs/symptoms of physical distress. Continue to progress workloads to maintain intensity without signs/symptoms of physical distress.        Resistance Training   Training Prescription Yes Yes      Weight red bands red bands      Reps 10-15 10-15      Time 10 Minutes 10 Minutes  NuStep   Level 2 2      SPM 76 --      Minutes 15 15      METs 2 2.3        Track   Laps 8 8.5      Minutes 15 15      METs 1.92 1.92         Exercise Comments:   Exercise Goals and Review:   Exercise Goals     Row Name 12/20/23 1334             Exercise Goals   Increase Physical Activity Yes       Intervention Provide advice, education, support and counseling about physical activity/exercise needs.;Develop an individualized exercise prescription for aerobic and resistive training based on initial evaluation findings, risk stratification, comorbidities and participant's personal goals.       Expected Outcomes Short Term: Attend rehab on a regular basis to increase amount of physical activity.;Long Term: Add in home exercise to make exercise part of routine and to increase amount of physical activity.;Long Term: Exercising regularly at least 3-5 days a week.       Increase Strength and Stamina Yes       Intervention Provide advice, education, support and counseling about physical activity/exercise needs.;Develop an individualized exercise prescription for aerobic and resistive training based on initial evaluation findings, risk stratification, comorbidities and participant's personal goals.       Expected Outcomes Short Term: Increase workloads from initial exercise prescription for resistance, speed, and METs.;Short Term: Perform resistance training exercises routinely during rehab and add in resistance training at home;Long Term: Improve cardiorespiratory fitness, muscular endurance and strength as measured by increased  METs and functional capacity ( )       Able to understand and use rate of perceived exertion (RPE) scale Yes       Intervention Provide education and explanation on how to use RPE scale       Expected Outcomes Short Term: Able to use RPE daily in rehab to express subjective intensity level;Long Term:  Able to use RPE to guide intensity level when exercising independently       Able to understand and use Dyspnea scale Yes       Intervention Provide education and explanation on how to use Dyspnea scale       Expected Outcomes Short Term: Able to use Dyspnea scale daily in rehab to express subjective sense of shortness of breath during exertion;Long Term: Able to use Dyspnea scale to guide intensity level when exercising independently       Knowledge and understanding of Target Heart Rate Range (THRR) Yes       Intervention Provide education and explanation of THRR including how the numbers were predicted and where they are located for reference       Expected Outcomes Short Term: Able to state/look up THRR;Long Term: Able to use THRR to govern intensity when exercising independently;Short Term: Able to use daily as guideline for intensity in rehab       Understanding of Exercise Prescription Yes       Intervention Provide education, explanation, and written materials on patient's individual exercise prescription       Expected Outcomes Short Term: Able to explain program exercise prescription;Long Term: Able to explain home exercise prescription to exercise independently          Exercise Goals Re-Evaluation :  Exercise Goals Re-Evaluation     Row Name 12/22/23 [phone] 01/15/24 [phone]  Exercise Goal Re-Evaluation   Exercise Goals Review Increase Physical Activity;Increase Strength and Stamina;Able to understand and use rate of perceived exertion (RPE) scale;Able to understand and use Dyspnea scale;Knowledge and understanding of Target Heart Rate Range (THRR);Understanding of Exercise  Prescription Increase Physical Activity;Increase Strength and Stamina;Able to understand and use rate of perceived exertion (RPE) scale;Able to understand and use Dyspnea scale;Knowledge and understanding of Target Heart Rate Range (THRR);Understanding of Exercise Prescription      Comments Buddy is schedule to begin exercise on 9/4. Will continue to monitor and progress as able. Buddy has completed 5 exercise sessions. He exercises for 15 min on the track and Nustep. Buddy averages 2.08 METs on the track and 2.2 METs at level 2 on the Nustep. He performs the warmup and cooldown standing holding onto a chair for balance. He has increased his laps on the track and level on the Nustep. METs have also increased. Will continue to monitor and progress as able.      Expected Outcomes Through exercise at rehab and home, the patient will decrease shortness of breath with daily activities and feel confident in carrying out an exercise regimen at home. Through exercise at rehab and home, the patient will decrease shortness of breath with daily activities and feel confident in carrying out an exercise regimen at home.         Discharge Exercise Prescription (Final Exercise Prescription Changes):  Exercise Prescription Changes - 01/23/24 1200       Response to Exercise   Blood Pressure (Admit) 130/68    Blood Pressure (Exercise) 142/80    Blood Pressure (Exit) 126/70    Heart Rate (Admit) 70 bpm    Heart Rate (Exercise) 93 bpm    Heart Rate (Exit) 77 bpm    Oxygen  Saturation (Admit) 95 %    Oxygen  Saturation (Exercise) 94 %    Oxygen  Saturation (Exit) 96 %    Rating of Perceived Exertion (Exercise) 16    Perceived Dyspnea (Exercise) 3    Duration Continue with 30 min of aerobic exercise without signs/symptoms of physical distress.    Intensity THRR unchanged      Progression   Progression Continue to progress workloads to maintain intensity without signs/symptoms of physical distress.      Resistance  Training   Training Prescription Yes    Weight red bands    Reps 10-15    Time 10 Minutes      NuStep   Level 2    Minutes 15    METs 2.3      Track   Laps 8.5    Minutes 15    METs 1.92          Nutrition:  Target Goals: Understanding of nutrition guidelines, daily intake of sodium 1500mg , cholesterol 200mg , calories 30% from fat and 7% or less from saturated fats, daily to have 5 or more servings of fruits and vegetables.  Biometrics:  Pre Biometrics - 12/20/23 1453       Pre Biometrics   Grip Strength 20 kg           Nutrition Therapy Plan and Nutrition Goals:   Nutrition Assessments:  MEDIFICTS Score Key: >=70 Need to make dietary changes  40-70 Heart Healthy Diet <= 40 Therapeutic Level Cholesterol Diet  Flowsheet Row PULMONARY REHAB OTHER RESPIRATORY from 08/18/2020 in [facility], V[facility] Lung Health  Picture Your Plate Total Score on Discharge 68   Picture Your Plate  Scores: <40 Unhealthy dietary pattern with much room for improvement. 41-50 Dietary pattern unlikely to meet recommendations for good health and room for improvement. 51-60 More healthful dietary pattern, with some room for improvement.  >60 Healthy dietary pattern, although there may be some specific behaviors that could be improved.    Nutrition Goals Re-Evaluation:   Nutrition Goals Discharge (Final Nutrition Goals Re-Evaluation):   Psychosocial: Target Goals: Acknowledge presence or absence of significant depression and/or stress, maximize coping skills, provide positive support system. Participant is able to verbalize types and ability to use techniques and skills needed for reducing stress and depression.  Initial Review & Psychosocial Screening:  Initial Psych Review & Screening - 12/20/23 1352       Initial Review   Current issues with None Identified      Family Dynamics   Good Support System? Yes      Screening Interventions    Interventions Encouraged to exercise          Quality of Life Scores:  Scores of 19 and below usually indicate a poorer quality of life in these areas.  A difference of  2-3 points is a clinically meaningful difference.  A difference of 2-3 points in the total score of the Quality of Life Index has been associated with significant improvement in overall quality of life, self-image, physical symptoms, and general health in studies assessing change in quality of life.  PHQ-9: Review Flowsheet       12/20/2023 06/19/2020  Depression screen PHQ 2/9  Decreased Interest 0 0  Down, Depressed, Hopeless 0 0  PHQ - 2 Score 0 0  Altered sleeping 0 0  Tired, decreased energy 1 3  Change in appetite 1 0  Feeling bad or failure about yourself  0 0  Trouble concentrating 0 1  Moving slowly or fidgety/restless 1 0  Suicidal thoughts 0 0  PHQ-9 Score 3 -  Difficult doing work/chores Very difficult Somewhat difficult   Interpretation of Total Score  Total Score Depression Severity:  1-4 = Minimal depression, 5-9 = Mild depression, 10-14 = Moderate depression, 15-19 = Moderately severe depression, 20-27 = Severe depression   Psychosocial Evaluation and Intervention:  Psychosocial Evaluation - 12/20/23 1441       Psychosocial Evaluation & Interventions   Interventions Encouraged to exercise with the program and follow exercise prescription    Comments Buddy denies any psychosocial barriers or concerns at this time    Expected Outcomes For Buddy to participate in PR free of any psychosocial barriers or concerns    Continue Psychosocial Services  No Follow up required          Psychosocial Re-Evaluation:  Psychosocial Re-Evaluation     Row Name 12/26/23 0942 01/12/24 1503           Psychosocial Re-Evaluation   Current issues with None Identified None Identified      Comments Buddy is scheduled to start PR on 12/28/23. No new concerns since orientation Buddy denies any new  psychosocial barriers or concerns at this time.      Expected Outcomes For Buddy to participate in PR free of any psychosocial barriers or concerns For Buddy to participate in PR free of any psychosocial barriers or concerns      Interventions Encouraged to attend Pulmonary Rehabilitation for the exercise Encouraged to attend Pulmonary Rehabilitation for the exercise      Continue Psychosocial Services  No Follow up required No Follow up required  Psychosocial Discharge (Final Psychosocial Re-Evaluation):  Psychosocial Re-Evaluation - 01/12/24 1503       Psychosocial Re-Evaluation   Current issues with None Identified    Comments Buddy denies any new psychosocial barriers or concerns at this time.    Expected Outcomes For Buddy to participate in PR free of any psychosocial barriers or concerns    Interventions Encouraged to attend Pulmonary Rehabilitation for the exercise    Continue Psychosocial Services  No Follow up required          Education: Education Goals: Education classes will be provided on a weekly basis, covering required topics. Participant will state understanding/return demonstration of topics presented.  Learning Barriers/Preferences:  Learning Barriers/Preferences - 12/20/23 1353       Learning Barriers/Preferences   Learning Barriers Sight;Hearing   wears hearing aids   Learning Preferences None          Education Topics: Know Your Numbers Group instruction that is supported by a PowerPoint presentation. Instructor discusses importance of knowing and understanding resting, exercise, and post-exercise oxygen  saturation, heart rate, and blood pressure. Oxygen  saturation, heart rate, blood pressure, rating of perceived exertion, and dyspnea are reviewed along with a normal range for these values.  Flowsheet Row PULMONARY REHAB OTHER RESPIRATORY from 01/11/2024 in [facility] for Heart, Vascular, & Lung Health  Date 01/11/24   Educator EP  Instruction Review Code 1- Verbalizes Understanding    Exercise for the Pulmonary Patient Group instruction that is supported by a PowerPoint presentation. Instructor discusses benefits of exercise, core components of exercise, frequency, duration, and intensity of an exercise routine, importance of utilizing pulse oximetry during exercise, safety while exercising, and options of places to exercise outside of rehab.  Flowsheet Row PULMONARY REHAB OTHER RESPIRATORY from 01/04/2024 in [facility] for Heart, Vascular, & Lung Health  Date 01/04/24  Educator EP  Instruction Review Code 1- Verbalizes Understanding    MET Level  Group instruction provided by PowerPoint, verbal discussion, and written material to support subject matter. Instructor reviews what METs are and how to increase METs.    Pulmonary Medications Verbally interactive group education provided by instructor with focus on inhaled medications and proper administration. Flowsheet Row PULMONARY REHAB OTHER RESPIRATORY from 12/28/2023 in [facility] for Heart, Vascular, & Lung Health  Date 12/28/23  Educator RT  Instruction Review Code 1- Verbalizes Understanding    Anatomy and Physiology of the Respiratory System Group instruction provided by PowerPoint, verbal discussion, and written material to support subject matter. Instructor reviews respiratory cycle and anatomical components of the respiratory system and their functions. Instructor also reviews differences in obstructive and restrictive respiratory diseases with examples of each.  Flowsheet Row PULMONARY REHAB OTHER RESPIRATORY from 08/13/2020 in [facility] for Heart, Vascular, & Lung Health  Date 07/02/20  Educator Handout    Oxygen  Safety Group instruction provided by PowerPoint, verbal discussion, and written material to support subject matter. There is an overview of "What is  Oxygen " and "Why do we need it".  Instructor also reviews how to create a safe environment for oxygen  use, the importance of using oxygen  as prescribed, and the risks of noncompliance. There is a brief discussion on traveling with oxygen  and resources the patient may utilize. Flowsheet Row PULMONARY REHAB OTHER RESPIRATORY from 01/18/2024 in [facility] for Heart, Vascular, & Lung Health  Date 01/18/24  Educator RN  Instruction Review Code 1- Verbalizes Understanding  Oxygen  Use Group instruction provided by PowerPoint, verbal discussion, and written material to discuss how supplemental oxygen  is prescribed and different types of oxygen  supply systems. Resources for more information are provided.    Breathing Techniques Group instruction that is supported by demonstration and informational handouts. Instructor discusses the benefits of pursed lip and diaphragmatic breathing and detailed demonstration on how to perform both.     Risk Factor Reduction Group instruction that is supported by a PowerPoint presentation. Instructor discusses the definition of a risk factor, different risk factors for pulmonary disease, and how the heart and lungs work together.   Pulmonary Diseases Group instruction provided by PowerPoint, verbal discussion, and written material to support subject matter. Instructor gives an overview of the different type of pulmonary diseases. There is also a discussion on risk factors and symptoms as well as ways to manage the diseases.   Stress and Energy Conservation Group instruction provided by PowerPoint, verbal discussion, and written material to support subject matter. Instructor gives an overview of stress and the impact it can have on the body. Instructor also reviews ways to reduce stress. There is also a discussion on energy conservation and ways to conserve energy throughout the day.   Warning Signs and Symptoms Group instruction provided  by PowerPoint, verbal discussion, and written material to support subject matter. Instructor reviews warning signs and symptoms of stroke, heart attack, cold and flu. Instructor also reviews ways to prevent the spread of infection.   Other Education Group or individual verbal, written, or video instructions that support the educational goals of the pulmonary rehab program. Flowsheet Row PULMONARY REHAB OTHER RESPIRATORY from 08/13/2020 in [facility] for Heart, Vascular, & Lung Health  Date 08/13/20  [Beat a Sedentary Lifestyle]  Educator handout  [Rate my plate]     Knowledge Questionnaire Score:  Knowledge Questionnaire Score - 12/20/23 1336       Knowledge Questionnaire Score   Pre Score 15/18          Core Components/Risk Factors/Patient Goals at Admission:  Personal Goals and Risk Factors at Admission - 12/20/23 1353       Core Components/Risk Factors/Patient Goals on Admission   Improve shortness of breath with ADL's Yes    Intervention Provide education, individualized exercise plan and daily activity instruction to help decrease symptoms of SOB with activities of daily living.    Expected Outcomes Short Term: Improve cardiorespiratory fitness to achieve a reduction of symptoms when performing ADLs;Long Term: Be able to perform more ADLs without symptoms or delay the onset of symptoms          Core Components/Risk Factors/Patient Goals Review:   Goals and Risk Factor Review     Row Name 12/26/23 0944 01/12/24 1503           Core Components/Risk Factors/Patient Goals Review   Personal Goals Review Improve shortness of breath with ADL's;Develop more efficient breathing techniques such as purse lipped breathing and diaphragmatic breathing and practicing self-pacing with activity. Improve shortness of breath with ADL's;Develop more efficient breathing techniques such as purse lipped breathing and diaphragmatic breathing and practicing self-pacing  with activity.      Review Buddy has not started PR yet. Unable to assess his goals at this time. Monthly review of patient's Core Components/Risk Factors/Patient Goals are as follows: Goal progressing for improving shortness of breath. Goal progressing for developing more efficient breathing techniques such as purse lipped breathing and diaphragmatic breathing; and practicing self-pacing with activity. Buddy  is currently exercising on RA to keep sats >88%. We will continue to monitor his progress thorughout the program.      Expected Outcomes See admission goals. To improve shortness of breath with ADL's and develop more efficient breathing techniques such as purse lipped breathing and diaphragmatic breathing; and practicing self-pacing with activity.         Core Components/Risk Factors/Patient Goals at Discharge (Final Review):   Goals and Risk Factor Review - 01/12/24 1503       Core Components/Risk Factors/Patient Goals Review   Personal Goals Review Improve shortness of breath with ADL's;Develop more efficient breathing techniques such as purse lipped breathing and diaphragmatic breathing and practicing self-pacing with activity.    Review Monthly review of patient's Core Components/Risk Factors/Patient Goals are as follows: Goal progressing for improving shortness of breath. Goal progressing for developing more efficient breathing techniques such as purse lipped breathing and diaphragmatic breathing; and practicing self-pacing with activity. Buddy is currently exercising on RA to keep sats >88%. We will continue to monitor his progress thorughout the program.    Expected Outcomes To improve shortness of breath with ADL's and develop more efficient breathing techniques such as purse lipped breathing and diaphragmatic breathing; and practicing self-pacing with activity.          ITP Comments: Pt is making expected progress toward Pulmonary Rehab goals after completing 8 session(s). Recommend  continued exercise, life style modification, education, and utilization of breathing techniques to increase stamina and strength, while also decreasing shortness of breath with exertion.  Dr. Slater Staff is Medical Director for Pulmonary Rehab at [facility]

## 2024-01-25 ENCOUNTER — Encounter (HOSPITAL_COMMUNITY)
Admission: RE | Admit: 2024-01-25 | Discharge: 2024-01-25 | Disposition: A | Discharge status: Home / Self Care | Source: Ambulatory Visit | Attending: Pulmonary Disease | Admitting: Pulmonary Disease

## 2024-01-25 DIAGNOSIS — J8410 Pulmonary fibrosis, unspecified: Secondary | ICD-10-CM | POA: Diagnosis not present

## 2024-01-25 NOTE — Progress Notes (Signed)
 Daily Session Note  Patient Details  Name: Alex [name]. MRN: [id number] Date of Birth: 1940-09-27 Referring Provider:   Conrad Ports Pulmonary Rehab Walk Test from 12/20/2023 in [facility] for Heart, Vascular, & Lung Health  Referring Provider Dr. Kara    Encounter Date: 01/25/2024  Check In:  Session Check In - 01/25/24 1039       Check-In   Supervising physician immediately available to respond to emergencies [facility] MD immediately available    Physician(s) Jackee Alberts, NP    Location [facility]    Staff Present Ronal Levin, RN, BSN;Channon Brougher Claudene, Neita Moats, MS, ACSM-CEP, Exercise Physiologist;Jetta Walker BS, ACSM-CEP, Exercise Physiologist    Virtual Visit No    Medication changes reported     No    Fall or balance concerns reported    No    Tobacco Cessation No Change    Warm-up and Cool-down Performed as group-led instruction    Resistance Training Performed Yes    VAD Patient? No    PAD/SET Patient? No      Pain Assessment   Currently in Pain? No/denies    Multiple Pain Sites No          Capillary Blood Glucose: No results found for this or any previous visit (from the past 24 hours).    Social History   Tobacco Use  Smoking Status Former   Current packs/day: 0.00   Average packs/day: 3.0 packs/day for 20.0 years (60.0 ttl pk-yrs)   Types: Cigarettes   Start date: 04/25/1949   Quit date: 04/25/1969   Years since quitting: 54.7  Smokeless Tobacco Former   Types: Chew   Quit date: 11/07/1983    Goals Met:  Proper associated with RPD/PD & O2 Sat Independence with exercise equipment Exercise tolerated well No report of concerns or symptoms today Strength training completed today  Goals Unmet:  Not Applicable  Comments: Service time is from 1001 to 1145.    Dr. Slater Staff is Medical Director for Pulmonary Rehab at [facility]

## 2024-01-30 ENCOUNTER — Encounter (HOSPITAL_COMMUNITY)
Admission: RE | Admit: 2024-01-30 | Discharge: 2024-01-30 | Disposition: A | Discharge status: Home / Self Care | Source: Ambulatory Visit | Attending: Pulmonary Disease | Admitting: Pulmonary Disease

## 2024-01-30 DIAGNOSIS — J8410 Pulmonary fibrosis, unspecified: Secondary | ICD-10-CM | POA: Diagnosis not present

## 2024-01-30 NOTE — Progress Notes (Signed)
 Daily Session Note  Patient Details  Name: Alex [name]. MRN: [id number] Date of Birth: 06-Jul-1940 Referring Provider:   Conrad Ports Pulmonary Rehab Walk Test from 12/20/2023 in [facility] for Heart, Vascular, & Lung Health  Referring Provider Dr. Kara    Encounter Date: 01/30/2024  Check In:  Session Check In - 01/30/24 1125       Check-In   Supervising physician immediately available to respond to emergencies [facility] MD immediately available    Physician(s) Barnie Hila, NP    [facility]    Staff Present Ronal Levin, RN, BSN;Trevel Dillenbeck Claudene, Neita Moats, MS, ACSM-CEP, Exercise Physiologist;Joseph Lennon, RN, BSN    Virtual Visit No    Medication changes reported     No    Fall or balance concerns reported    No    Tobacco Cessation No Change    Warm-up and Cool-down Performed as group-led instruction    Resistance Training Performed Yes    VAD Patient? No    PAD/SET Patient? No      Pain Assessment   Currently in Pain? No/denies    Pain Score 0-No pain    Multiple Pain Sites No          Capillary Blood Glucose: No results found for this or any previous visit (from the past 24 hours).    Social History   Tobacco Use  Smoking Status Former   Current packs/day: 0.00   Average packs/day: 3.0 packs/day for 20.0 years (60.0 ttl pk-yrs)   Types: Cigarettes   Start date: 04/25/1949   Quit date: 04/25/1969   Years since quitting: 54.8  Smokeless Tobacco Former   Types: Chew   Quit date: 11/07/1983    Goals Met:  Proper associated with RPD/PD & O2 Sat Independence with exercise equipment Exercise tolerated well No report of concerns or symptoms today Strength training completed today  Goals Unmet:  Not Applicable  Comments: Service time is from 1013 to 1143.    Dr. Slater Staff is Medical Director for Pulmonary Rehab at [facility].

## 2024-02-01 ENCOUNTER — Ambulatory Visit: Admitting: Cardiovascular Disease

## 2024-02-01 ENCOUNTER — Encounter (HOSPITAL_COMMUNITY)
Admission: RE | Admit: 2024-02-01 | Discharge: 2024-02-01 | Disposition: A | Source: Ambulatory Visit | Attending: Pulmonary Disease

## 2024-02-01 DIAGNOSIS — J8410 Pulmonary fibrosis, unspecified: Secondary | ICD-10-CM

## 2024-02-01 NOTE — Progress Notes (Signed)
 Daily Session Note  Patient Details  Name: Alex [name]. MRN: [id number] Date of Birth: 09-08-1940 Referring Provider:   Conrad Ports Pulmonary Rehab Walk Test from 12/20/2023 in [facility] for Heart, Vascular, & Lung Health  Referring Provider Dr. Kara    Encounter Date: 02/01/2024  Check In:  Session Check In - 02/01/24 1034       Check-In   Supervising physician immediately available to respond to emergencies [facility] MD immediately available    Physician(s) Josefa Beauvais, NP    [facility]    Staff Present Ronal Levin, RN, BSN;Shamariah Shewmake Claudene, Neita Moats, MS, ACSM-CEP, Exercise Physiologist;Randi Midge BS, ACSM-CEP, Exercise Physiologist    Virtual Visit No    Medication changes reported     No    Fall or balance concerns reported    No    Tobacco Cessation No Change    Warm-up and Cool-down Performed as group-led instruction    Resistance Training Performed Yes    VAD Patient? No    PAD/SET Patient? No      Pain Assessment   Currently in Pain? No/denies    Pain Score 0-No pain    Multiple Pain Sites No          Capillary Blood Glucose: No results found for this or any previous visit (from the past 24 hours).    Social History   Tobacco Use  Smoking Status Former   Current packs/day: 0.00   Average packs/day: 3.0 packs/day for 20.0 years (60.0 ttl pk-yrs)   Types: Cigarettes   Start date: 04/25/1949   Quit date: 04/25/1969   Years since quitting: 54.8  Smokeless Tobacco Former   Types: Chew   Quit date: 11/07/1983    Goals Met:  Proper associated with RPD/PD & O2 Sat Independence with exercise equipment Exercise tolerated well No report of concerns or symptoms today Strength training completed today  Goals Unmet:  Not Applicable  Comments: Service time is from 1015 to 1149.    Dr. Slater Staff is Medical Director for Pulmonary Rehab at [facility].

## 2024-02-06 ENCOUNTER — Encounter (HOSPITAL_COMMUNITY)
Admission: RE | Admit: 2024-02-06 | Discharge: 2024-02-06 | Disposition: A | Discharge status: Home / Self Care | Source: Ambulatory Visit | Attending: Pulmonary Disease | Admitting: Pulmonary Disease

## 2024-02-06 VITALS — Wt 182.1 lb

## 2024-02-06 DIAGNOSIS — J8410 Pulmonary fibrosis, unspecified: Secondary | ICD-10-CM

## 2024-02-06 NOTE — Progress Notes (Signed)
 Daily Session Note  Patient Details  Name: Alex [name]. MRN: [id number] Date of Birth: 16-Nov-1940 Referring Provider:   Conrad Ports Pulmonary Rehab Walk Test from 12/20/2023 in [facility] for Heart, Vascular, & Lung Health  Referring Provider Dr. Kara    Encounter Date: 02/06/2024  Check In:  Session Check In - 02/06/24 1125       Check-In   Supervising physician immediately available to respond to emergencies [facility] MD immediately available    Physician(s) Orren Fabry, NP    [facility]    Staff Present Ronal Levin, RN, BSN;Casey Claudene, Neita Moats, MS, ACSM-CEP, Exercise Physiologist;Randi Midge HECKLE, ACSM-CEP, Exercise Physiologist;Johnny Fayette, MS, Exercise Physiologist    Virtual Visit No    Medication changes reported     No    Fall or balance concerns reported    No    Tobacco Cessation No Change    Warm-up and Cool-down Performed as group-led instruction    Resistance Training Performed Yes    VAD Patient? No    PAD/SET Patient? No      Pain Assessment   Currently in Pain? No/denies    Multiple Pain Sites No          Capillary Blood Glucose: No results found for this or any previous visit (from the past 24 hours).   Exercise Prescription Changes - 02/06/24 1200       Response to Exercise   Blood Pressure (Admit) 108/70    Blood Pressure (Exercise) 134/70    Blood Pressure (Exit) 110/70    Heart Rate (Admit) 70 bpm    Heart Rate (Exercise) 88 bpm    Heart Rate (Exit) 75 bpm    Oxygen  Saturation (Admit) 96 %    Oxygen  Saturation (Exercise) 95 %    Oxygen  Saturation (Exit) 97 %    Rating of Perceived Exertion (Exercise) 15    Perceived Dyspnea (Exercise) 4    Duration Continue with 30 min of aerobic exercise without signs/symptoms of physical distress.    Intensity THRR unchanged      Progression   Progression Continue to progress workloads to maintain intensity without signs/symptoms of  physical distress.      Resistance Training   Training Prescription Yes    Weight red bands    Reps 10-15    Time 10 Minutes      NuStep   Level 2    SPM 87    Minutes 15    METs 2.2      Track   Laps 9    Minutes 15    METs 2.38          Social History   Tobacco Use  Smoking Status Former   Current packs/day: 0.00   Average packs/day: 3.0 packs/day for 20.0 years (60.0 ttl pk-yrs)   Types: Cigarettes   Start date: 04/25/1949   Quit date: 04/25/1969   Years since quitting: 54.8  Smokeless Tobacco Former   Types: Chew   Quit date: 11/07/1983    Goals Met:  Proper associated with RPD/PD & O2 Sat Exercise tolerated well No report of concerns or symptoms today Strength training completed today  Goals Unmet:  Not Applicable  Comments: Service time is from 1019 to 1138.    Dr. Slater Staff is Medical Director for Pulmonary Rehab at [facility].

## 2024-02-08 ENCOUNTER — Encounter (HOSPITAL_COMMUNITY)
Admission: RE | Admit: 2024-02-08 | Discharge: 2024-02-08 | Disposition: A | Discharge status: Home / Self Care | Source: Ambulatory Visit | Attending: Pulmonary Disease | Admitting: Pulmonary Disease

## 2024-02-08 DIAGNOSIS — J8410 Pulmonary fibrosis, unspecified: Secondary | ICD-10-CM

## 2024-02-08 NOTE — Progress Notes (Signed)
 Daily Session Note  Patient Details  Name: Alex [name]. MRN: [id number] Date of Birth: [birth year] Referring Provider:   Conrad Ports Pulmonary Rehab Walk Test from 12/20/2023 in [facility] for Heart, Vascular, & Lung Health  Referring Provider Dr. Kara    Encounter Date: 02/08/2024  Check In:  Session Check In - 02/08/24 1037       Check-In   Supervising physician immediately available to respond to emergencies [facility] MD immediately available    Physician(s) Rosabel Mose, NP    [facility]    Staff Present Ronal Levin, RN, BSN;Casey Claudene, Neita Moats, MS, ACSM-CEP, Exercise Physiologist;Randi Midge BS, ACSM-CEP, Exercise Physiologist    Virtual Visit No    Medication changes reported     No    Fall or balance concerns reported    No    Tobacco Cessation No Change    Warm-up and Cool-down Performed as group-led instruction    Resistance Training Performed Yes    VAD Patient? No    PAD/SET Patient? No      Pain Assessment   Currently in Pain? No/denies    Pain Score 0-No pain    Multiple Pain Sites No          Capillary Blood Glucose: No results found for this or any previous visit (from the past 24 hours).    Social History   Tobacco Use  Smoking Status Former   Current packs/day: 0.00   Average packs/day: 3.0 packs/day for 20.0 years (60.0 ttl pk-yrs)   Types: Cigarettes   Start date: 04/25/1949   Quit date: 04/25/1969   Years since quitting: 54.8  Smokeless Tobacco Former   Types: Chew   Quit date: 11/07/1983    Goals Met:  Proper associated with RPD/PD & O2 Sat Exercise tolerated well No report of concerns or symptoms today Strength training completed today  Goals Unmet:  Not Applicable  Comments: Service time is from 1016 to 1145.    Dr. Slater Staff is Medical Director for Pulmonary Rehab at [facility].

## 2024-02-13 ENCOUNTER — Encounter (HOSPITAL_COMMUNITY)
Admission: RE | Admit: 2024-02-13 | Discharge: 2024-02-13 | Disposition: A | Discharge status: Home / Self Care | Source: Ambulatory Visit | Attending: Pulmonary Disease | Admitting: Pulmonary Disease

## 2024-02-13 DIAGNOSIS — J8410 Pulmonary fibrosis, unspecified: Secondary | ICD-10-CM | POA: Diagnosis not present

## 2024-02-13 NOTE — Progress Notes (Signed)
 Daily Session Note  Patient Details  Name: Alex [name]. MRN: [id number] Date of Birth: 1940-07-02 Referring Provider:   Conrad Ports Pulmonary Rehab Walk Test from 12/20/2023 in [facility]  Referring Provider Dr. Kara    Encounter Date: 02/13/2024  Check In:  Session Check In - 02/13/24 1156       Check-In   Supervising physician immediately available to respond to emergencies [facility] MD immediately available    Physician(s) Rosabel Mose, NP    Location [facility]    Staff Present Ronal Levin, RN, BSN;Mayer Vondrak Claudene, RT;Randi [place] BS, ACSM-CEP, Exercise Physiologist;Olinty [place], MS, ACSM-CEP, Exercise Physiologist    Virtual Visit No    Medication changes reported     No    Fall or balance concerns reported    No    Tobacco Cessation No Change    Warm-up and Cool-down Performed as group-led instruction    Resistance Training Performed Yes    VAD Patient? No    PAD/SET Patient? No      Pain Assessment   Currently in Pain? No/denies    Multiple Pain Sites No          Capillary Blood Glucose: No results found for this or any previous visit (from the past 24 hours).    Social History   Tobacco Use  Smoking Status Former   Current packs/day: 0.00   Average packs/day: 3.0 packs/day for 20.0 years (60.0 ttl pk-yrs)   Types: Cigarettes   Start date: 04/25/1949   Quit date: 04/25/1969   Years since quitting: 54.8  Smokeless Tobacco Former   Types: Chew   Quit date: 11/07/1983    Goals Met:  Proper associated with RPD/PD & O2 Sat Independence with exercise equipment Exercise tolerated well No report of concerns or symptoms today Strength training completed today  Goals Unmet:  Not Applicable  Comments: Service time is from 1000 to 1135.    Dr. Slater Staff is Medical Director for Pulmonary Rehab at [facility].

## 2024-02-15 ENCOUNTER — Encounter (HOSPITAL_COMMUNITY)
Admission: RE | Admit: 2024-02-15 | Discharge: 2024-02-15 | Disposition: A | Discharge status: Home / Self Care | Source: Ambulatory Visit | Attending: Pulmonary Disease | Admitting: Pulmonary Disease

## 2024-02-15 DIAGNOSIS — J8410 Pulmonary fibrosis, unspecified: Secondary | ICD-10-CM | POA: Diagnosis not present

## 2024-02-15 NOTE — Progress Notes (Signed)
 Daily Session Note  Patient Details  Name: Alex [name]. MRN: [id number] Date of Birth: 08-30-1940 Referring Provider:   Conrad Ports Pulmonary Rehab Walk Test from 12/20/2023 in [facility]  Referring Provider Dr. Kara    Encounter Date: 02/15/2024  Check In:  Session Check In - 02/15/24 1108       Check-In   Supervising physician immediately available to respond to emergencies [facility] MD immediately available    Physician(s) Damien Braver, NP    Location [facility]    Staff Present Ronal Levin, RN, BSN;Casey Claudene, RT;Randi [facility] BS, ACSM-CEP, Exercise Physiologist;Huriel Matt [place], MS, ACSM-CEP, Exercise Physiologist    Virtual Visit No    Medication changes reported     No    Fall or balance concerns reported    No    Tobacco Cessation No Change    Warm-up and Cool-down Performed as group-led instruction    Resistance Training Performed Yes    VAD Patient? No    PAD/SET Patient? No      Pain Assessment   Currently in Pain? No/denies    Multiple Pain Sites No          Capillary Blood Glucose: No results found for this or any previous visit (from the past 24 hours).    Social History   Tobacco Use  Smoking Status Former   Current packs/day: 0.00   Average packs/day: 3.0 packs/day for 20.0 years (60.0 ttl pk-yrs)   Types: Cigarettes   Start date: 04/25/1949   Quit date: 04/25/1969   Years since quitting: 54.8  Smokeless Tobacco Former   Types: Chew   Quit date: 11/07/1983    Goals Met:  Proper associated with RPD/PD & O2 Sat Exercise tolerated well No report of concerns or symptoms today Strength training completed today  Goals Unmet:  Not Applicable  Comments: Service time is from 1011 to 1147.    Dr. Slater Staff is Medical Director for Pulmonary Rehab at [facility].

## 2024-02-20 ENCOUNTER — Encounter (HOSPITAL_COMMUNITY)
Admission: RE | Admit: 2024-02-20 | Discharge: 2024-02-20 | Disposition: A | Discharge status: Home / Self Care | Source: Ambulatory Visit | Attending: Pulmonary Disease | Admitting: Pulmonary Disease

## 2024-02-20 VITALS — Wt 184.3 lb

## 2024-02-20 DIAGNOSIS — J8410 Pulmonary fibrosis, unspecified: Secondary | ICD-10-CM

## 2024-02-21 NOTE — Progress Notes (Signed)
 Pulmonary Individual Treatment Plan  Patient Details  Name: Alex [name]. MRN: [id number] Date of Birth: [birth year] Referring Provider:   Conrad Ports Pulmonary Rehab Walk Test from 12/20/2023 in [facility]  Referring Provider Dr. Kara    Initial Encounter Date:  Flowsheet Row Pulmonary Rehab Walk Test from 12/20/2023 in [facility]  Date 12/20/23    Visit Diagnosis: Pulmonary fibrosis (HCC)  Patient's Home Medications on Admission:   Current Outpatient Medications:    albuterol  (ACCUNEB ) 1.25 MG/3ML nebulizer solution, Take 1 ampule by nebulization every 6 (six) hours as needed for shortness of breath or wheezing., Disp: , Rfl:    apixaban  (ELIQUIS ) 5 MG TABS tablet, Take 5 mg by mouth 2 (two) times daily., Disp: , Rfl:    Ascorbic Acid  (VITAMIN C ) 1000 MG tablet, Take 1,000 mg by mouth daily., Disp: , Rfl:    atorvastatin  (LIPITOR) 40 MG tablet, Take 40 mg by mouth daily., Disp: , Rfl:    capsaicin (ZOSTRIX) 0.025 % cream, Apply 1 Application topically as needed (aches and pains)., Disp: , Rfl:    carvedilol  (COREG ) 6.25 MG tablet, Take 3.125-6.25 mg by mouth See admin instructions. 6.25 mg in the morning, 3.125 mg in the evening, Disp: , Rfl:    Cholecalciferol  125 MCG (5000 UT) TABS, Take 5,000 Units by mouth 2 (two) times a day., Disp: , Rfl:    diclofenac Sodium (VOLTAREN) 1 % GEL, Apply topically., Disp: , Rfl:    ezetimibe  (ZETIA ) 10 MG tablet, Take 10 mg by mouth daily., Disp: , Rfl:    fluticasone  (FLONASE ) 50 MCG/ACT nasal spray, Place 2 sprays into both nostrils daily., Disp: 16 g, Rfl: 5   furosemide  (LASIX ) 40 MG tablet, Take 40 mg by mouth daily. , Disp: , Rfl:    Ipratropium-Albuterol  (COMBIVENT RESPIMAT) 20-100 MCG/ACT AERS respimat, Inhale 1 puff into the lungs every 6 (six) hours as needed for wheezing or shortness of breath., Disp: , Rfl:     isosorbide  mononitrate (IMDUR ) 30 MG 24 hr tablet, Take 30 mg by mouth daily., Disp: , Rfl:    lidocaine  (LIDODERM ) 5 %, Place 1 patch onto the skin daily as needed (pain)., Disp: , Rfl:    Multiple Vitamin (MULTI-VITAMINS) TABS, Take 1 tablet by mouth at bedtime., Disp: , Rfl:    mupirocin  ointment (BACTROBAN ) 2 %, Apply 1 Application topically daily as needed (wound care)., Disp: , Rfl:    nitroGLYCERIN  (NITROSTAT ) 0.4 MG SL tablet, DISSOLVE ONE TABLET UNDER TONGUE EVERY 5 MINUTES AS NEEDED UP TO 3 DOSES, IF NORELIEF CALL 911, Disp: 25 tablet, Rfl: 11   pantoprazole  (PROTONIX ) 40 MG tablet, Take 1 tablet (40 mg total) by mouth 2 (two) times daily., Disp: 180 tablet, Rfl: 1   potassium chloride  SA (KLOR-CON ) 20 MEQ tablet, Take 20 mEq by mouth daily., Disp: , Rfl:    Probiotic Product (PROBIOTIC-10 PO), Take 1 capsule by mouth daily., Disp: , Rfl:    tamsulosin  (FLOMAX ) 0.4 MG CAPS capsule, Take 0.4 mg by mouth 2 (two) times daily., Disp: , Rfl:    triamcinolone  cream (KENALOG ) 0.1 %, Apply 1 Application topically daily as needed (irritation)., Disp: , Rfl:    vitamin B-12 (CYANOCOBALAMIN) 250 MCG tablet, Take 500 mcg by mouth daily., Disp: , Rfl:    zinc gluconate 50 MG tablet, Take 50 mg by mouth daily., Disp: , Rfl:   Past  Medical History: Past Medical History:  Diagnosis Date   AL amyloidosis [facility]) oncologist-- dr onesimo   Renal and Systemic , dx 04/ 2018;  renal bx 08-05-2016 and bone marrow bx 08-29-2016;  8 month chemotherapy then stell cell transplant 12/ 2018 then chemo again;  since then been on oral chemo daily (11-07-2019 per pt has been on hold until after prostate seed implants)   Arthritis    Atypical chest pain    CAD (coronary artery disease) cardiologist-- dr verlin;  also [facility] in [place], dr d. sarah   moderate nonobstrucive disease by cath 2011; Lexiscan  Myoview  6/14--Normal study, no scar or ischemia, EF 62%;  11-14-2014 nuclear study low  risk w/ normal perfusion, nuclear ef 67%   CKD (chronic kidney disease), stage III (HCC)    COVID    b/l PNA (7/20)- 21 day hospitalization tx with steroids, Remdesevir, Actemra , Hydroxychloroquine ) --> PE on Eliquis .   Diverticulosis of colon    Dyspnea    WITH EXERTION    ED (erectile dysfunction)    Fatty liver    GERD (gastroesophageal reflux disease)    H/O stem cell transplant (HCC) 03/2017   Headache    Heart murmur    Hiatal hernia    History of 2019 novel coronavirus disease (COVID-19) 09/2018   [facility] covid test [facility] 06/ 2020, hospital admission 10-28-2019 respiratory failure/ pneumometrostinom/ PE ;  was not intubated , treatment with remdesivir / steroids/ actemra / plasma   History of basal cell carcinoma (BCC)    History of colon polyps    History of MRSA infection    culture positive   History of pulmonary embolus (PE) 10/2018   with covid 19, completed treatment with eliquis    History of seizures    11-07-2019  per pt last one 1970s, unknown cause   History of vertebral compression fracture 1988   lumbar   Hx of adenomatous colonic polyps    negative in 7/08   Hyperlipidemia    Hypertension    IFG (impaired fasting glucose)    Neuropathy due to chemotherapeutic drug    fingers tips and feet   Nocturia    On supplemental oxygen  therapy    post covid 19;   11-07-2019 pt uses O2 every night 2L via [employer],  and uses supplements during the day when walking around neighborhood and goes out to appointments/ store that he has to walk a distance   OSA (obstructive sleep apnea)    study in epic 03-18-2005 mild to moderate   (11-07-2019  per pt used cpap up until when he lost alot of weight, stoped approx. 2012   Pneumonia    Prostate cancer [facility]) urologist--- dr pace/  oncology-- dr patrcia   dx 03/ 2021   Pulmonary fibrosis (HCC) 09/2018   pulmonologist-- dr theophilus;   post covid 19   Seizure (HCC)    1st in 1976; last in 1978 GTC x 2    SOB (shortness of breath) on  exertion    11-07-2019  per pt walks around neighborhood for 30 minutes twice daily, he does have to stop frequently d/t sob and since weather has been hot he uses his supplemental oxygen ;  stated he has a pulse ox. at home , when he is sitting down O2 sat 96 -97% on RA when up walking around the house O2 sat 93-94% on RA;  pt stated he carry's his O2 w/ him when he goes to doctor appts/store d/t distance  Wears glasses    Wears hearing aid in both ears     Tobacco Use: Social History   Tobacco Use  Smoking Status Former   Current packs/day: 0.00   Average packs/day: 3.0 packs/day for 20.0 years (60.0 ttl pk-yrs)   Types: Cigarettes   Start date: 04/25/1949   Quit date: 04/25/1969   Years since quitting: 54.8  Smokeless Tobacco Former   Types: Chew   Quit date: 11/07/1983    Labs: Review Flowsheet  More data exists      Latest Ref Rng & Units 10/29/2018 11/08/2018 09/24/2020 11/03/2020 06/28/2022  Labs for ITP Cardiac and Pulmonary Rehab  Trlycerides <150 mg/dL 896  - - - -  Hemoglobin A1c 4.8 - 5.6 % - - 6.0  6.5  5.5   PH, Arterial 7.350 - 7.450 - 7.528  - - -  PCO2 arterial 32.0 - 48.0 mmHg - 27.2  - - -  Bicarbonate 20.0 - 28.0 mmol/L - 22.7  - - -  TCO2 22 - 32 mmol/L - 24  - - -  O2 Saturation % - 90.0  - - -    Capillary Blood Glucose: Lab Results  Component Value Date   GLUCAP 119 (H) 06/28/2022   GLUCAP 113 (H) 11/09/2018   GLUCAP 143 (H) 11/08/2018   GLUCAP 115 (H) 11/08/2018   GLUCAP 153 (H) 11/08/2018     Pulmonary Assessment Scores:  Pulmonary Assessment Scores     Row Name 12/20/23 1439         ADL UCSD   ADL Phase Entry     SOB Score total 84       CAT Score   CAT Score 31       mMRC Score   mMRC Score 4       UCSD: Self-administered rating of dyspnea associated with activities of daily living (ADLs) 6-point scale (0 = not at all to 5 = maximal or unable to do because of breathlessness)  Scoring Scores range from 0 to 120.  Minimally  important difference is 5 units  CAT: CAT can identify the health impairment of COPD patients and is better correlated with disease progression.  CAT has a scoring range of zero to 40. The CAT score is classified into four groups of low (less than 10), medium (10 - 20), high (21-30) and very high (31-40) based on the impact level of disease on health status. A CAT score over 10 suggests significant symptoms.  A worsening CAT score could be explained by an exacerbation, poor medication adherence, poor inhaler technique, or progression of COPD or comorbid conditions.  CAT MCID is 2 points  mMRC: mMRC (Modified Medical Research Council) Dyspnea Scale is used to assess the degree of baseline functional disability in patients of respiratory disease due to dyspnea. No minimal important difference is established. A decrease in score of 1 point or greater is considered a positive change.   Pulmonary Function Assessment:  Pulmonary Function Assessment - 12/20/23 1358       Breath   Bilateral Breath Sounds Decreased    Shortness of Breath Yes;Limiting activity          Exercise Target Goals: Exercise Program Goal: Individual exercise prescription set using results from initial 6 min walk test and THRR while considering  patient's activity barriers and safety.   Exercise Prescription Goal: Initial exercise prescription builds to 30-45 minutes a day of aerobic activity, 2-3 days per week.  Home exercise guidelines will be given  to patient during program as part of exercise prescription that the participant will acknowledge.  Activity Barriers & Risk Stratification:  Activity Barriers & Cardiac Risk Stratification - 12/20/23 1353       Activity Barriers & Cardiac Risk Stratification   Activity Barriers Deconditioning;Muscular Weakness;Shortness of Breath;Balance Concerns    Cardiac Risk Stratification Moderate          6 Minute Walk:  6 Minute Walk     Row Name 12/20/23 1445          6 Minute Walk   Phase Initial     Distance 629 feet     Walk Time 6 minutes     # of Rest Breaks 1  4:31-5:13     MPH 1.19     METS 1.08     RPE 15     Perceived Dyspnea  4     VO2 Peak 3.79     Symptoms No     Resting HR 75 bpm     Resting BP 108/60     Resting Oxygen  Saturation  95 %     Exercise Oxygen  Saturation  during 6 min walk 94 %     Max Ex. HR 75 bpm     Max Ex. BP 126/70     2 Minute Post BP 120/70       Interval HR   1 Minute HR 73     2 Minute HR 74     3 Minute HR 71     4 Minute HR 71     5 Minute HR 72     6 Minute HR 66     2 Minute Post HR 62     Interval Heart Rate? Yes       Interval Oxygen    Interval Oxygen ? Yes     Baseline Oxygen  Saturation % 95 %     1 Minute Oxygen  Saturation % 96 %     1 Minute Liters of Oxygen  0 L     2 Minute Oxygen  Saturation % 96 %     2 Minute Liters of Oxygen  0 L     3 Minute Oxygen  Saturation % 95 %     3 Minute Liters of Oxygen  0 L     4 Minute Oxygen  Saturation % 95 %     4 Minute Liters of Oxygen  0 L     5 Minute Oxygen  Saturation % 94 %     5 Minute Liters of Oxygen  0 L     6 Minute Oxygen  Saturation % 95 %     6 Minute Liters of Oxygen  0 L     2 Minute Post Oxygen  Saturation % 95 %     2 Minute Post Liters of Oxygen  0 L        Oxygen  Initial Assessment:  Oxygen  Initial Assessment - 12/20/23 1355       Home Oxygen    Home Oxygen  Device Home Concentrator;Liquid Oxygen ;E-Tanks    Sleep Oxygen  Prescription CPAP    Liters per minute 2    Home Exercise Oxygen  Prescription Continuous   only wears when he feels like he needs it   Liters per minute 2    Home Resting Oxygen  Prescription Continuous   only wears when he feels like he needs it   Liters per minute 2    Compliance with Home Oxygen  Use Yes      Initial 6 min Walk   Oxygen  Used None  Program Oxygen  Prescription   Program Oxygen  Prescription None      Intervention   Short Term Goals To learn and understand importance of monitoring SPO2  with pulse oximeter and demonstrate accurate use of the pulse oximeter.;To learn and understand importance of maintaining oxygen  saturations>88%;To learn and demonstrate proper pursed lip breathing techniques or other breathing techniques. ;To learn and demonstrate proper use of respiratory medications;To learn and exhibit compliance with exercise, home and travel O2 prescription    Long  Term Goals Maintenance of O2 saturations>88%;Compliance with respiratory medication;Verbalizes importance of monitoring SPO2 with pulse oximeter and return demonstration;Exhibits proper breathing techniques, such as pursed lip breathing or other method taught during program session;Demonstrates proper use of MDI's;Exhibits compliance with exercise, home  and travel O2 prescription          Oxygen  Re-Evaluation:  Oxygen  Re-Evaluation     Row Name 01/15/24 0919 02/09/24 0926           Program Oxygen  Prescription   Program Oxygen  Prescription None None        Home Oxygen    Home Oxygen  Device Home Concentrator;Liquid Oxygen ;E-Tanks Home Concentrator;Liquid Oxygen ;E-Tanks      Sleep Oxygen  Prescription CPAP CPAP      Liters per minute 2 2      Home Exercise Oxygen  Prescription Continuous  only wears when he feels like he needs it Continuous  PRN      Liters per minute 2 2      Home Resting Oxygen  Prescription Continuous  only wears when he feels like he needs it Continuous  PRN      Liters per minute 2 2      Compliance with Home Oxygen  Use Yes Yes        Goals/Expected Outcomes   Short Term Goals To learn and understand importance of monitoring SPO2 with pulse oximeter and demonstrate accurate use of the pulse oximeter.;To learn and understand importance of maintaining oxygen  saturations>88%;To learn and demonstrate proper pursed lip breathing techniques or other breathing techniques. ;To learn and demonstrate proper use of respiratory medications;To learn and exhibit compliance with exercise, home and  travel O2 prescription To learn and understand importance of monitoring SPO2 with pulse oximeter and demonstrate accurate use of the pulse oximeter.;To learn and understand importance of maintaining oxygen  saturations>88%;To learn and demonstrate proper pursed lip breathing techniques or other breathing techniques. ;To learn and demonstrate proper use of respiratory medications;To learn and exhibit compliance with exercise, home and travel O2 prescription      Long  Term Goals Maintenance of O2 saturations>88%;Compliance with respiratory medication;Verbalizes importance of monitoring SPO2 with pulse oximeter and return demonstration;Exhibits proper breathing techniques, such as pursed lip breathing or other method taught during program session;Demonstrates proper use of MDI's;Exhibits compliance with exercise, home  and travel O2 prescription Maintenance of O2 saturations>88%;Compliance with respiratory medication;Verbalizes importance of monitoring SPO2 with pulse oximeter and return demonstration;Exhibits proper breathing techniques, such as pursed lip breathing or other method taught during program session;Demonstrates proper use of MDI's;Exhibits compliance with exercise, home  and travel O2 prescription      Goals/Expected Outcomes Compliance and understanding of oxygen  saturation monitoring and breathing techniques to decrease shortness of breath. Compliance and understanding of oxygen  saturation monitoring and breathing techniques to decrease shortness of breath.         Oxygen  Discharge (Final Oxygen  Re-Evaluation):  Oxygen  Re-Evaluation - 02/09/24 0926       Program Oxygen  Prescription   Program Oxygen  Prescription None  Home Oxygen    Home Oxygen  Device Home Concentrator;Liquid Oxygen ;E-Tanks    Sleep Oxygen  Prescription CPAP    Liters per minute 2    Home Exercise Oxygen  Prescription Continuous   PRN   Liters per minute 2    Home Resting Oxygen  Prescription Continuous   PRN   Liters  per minute 2    Compliance with Home Oxygen  Use Yes      Goals/Expected Outcomes   Short Term Goals To learn and understand importance of monitoring SPO2 with pulse oximeter and demonstrate accurate use of the pulse oximeter.;To learn and understand importance of maintaining oxygen  saturations>88%;To learn and demonstrate proper pursed lip breathing techniques or other breathing techniques. ;To learn and demonstrate proper use of respiratory medications;To learn and exhibit compliance with exercise, home and travel O2 prescription    Long  Term Goals Maintenance of O2 saturations>88%;Compliance with respiratory medication;Verbalizes importance of monitoring SPO2 with pulse oximeter and return demonstration;Exhibits proper breathing techniques, such as pursed lip breathing or other method taught during program session;Demonstrates proper use of MDI's;Exhibits compliance with exercise, home  and travel O2 prescription    Goals/Expected Outcomes Compliance and understanding of oxygen  saturation monitoring and breathing techniques to decrease shortness of breath.          Initial Exercise Prescription:  Initial Exercise Prescription - 12/20/23 1400       Date of Initial Exercise RX and Referring Provider   Date 12/20/23    Referring Provider Dr. Kara    Expected Discharge Date 03/19/24      NuStep   Level 1    SPM 75    Minutes 15    METs 1.8      Track   Laps 6    Minutes 15    METs 1.9      Prescription Details   Frequency (times per week) 2    Duration Progress to 30 minutes of continuous aerobic without signs/symptoms of physical distress      Intensity   THRR 40-80% of Max Heartrate 56-112    Ratings of Perceived Exertion 11-13    Perceived Dyspnea 0-4      Progression   Progression Continue to progress workloads to maintain intensity without signs/symptoms of physical distress.      Resistance Training   Training Prescription Yes    Weight red bands    Reps 10-15           Perform Capillary Blood Glucose checks as needed.  Exercise Prescription Changes:   Exercise Prescription Changes     Row Name 01/09/24 1200 01/23/24 1200 02/06/24 1200 02/20/24 1200       Response to Exercise   Blood Pressure (Admit) 118/66 130/68 108/70 110/64    Blood Pressure (Exercise) 108/70 142/80 134/70 132/78    Blood Pressure (Exit) 118/86 126/70 110/70 122/68    Heart Rate (Admit) 86 bpm 70 bpm 70 bpm 60 bpm    Heart Rate (Exercise) 88 bpm 93 bpm 88 bpm 76 bpm    Heart Rate (Exit) 79 bpm 77 bpm 75 bpm 65 bpm    Oxygen  Saturation (Admit) 96 % 95 % 96 % 96 %    Oxygen  Saturation (Exercise) 93 % 94 % 95 % 95 %    Oxygen  Saturation (Exit) 95 % 96 % 97 % 98 %    Rating of Perceived Exertion (Exercise) 15 16 15 16     Perceived Dyspnea (Exercise) 3 3 4 3     Duration Continue with 30  min of aerobic exercise without signs/symptoms of physical distress. Continue with 30 min of aerobic exercise without signs/symptoms of physical distress. Continue with 30 min of aerobic exercise without signs/symptoms of physical distress. Continue with 30 min of aerobic exercise without signs/symptoms of physical distress.    Intensity THRR unchanged THRR unchanged THRR unchanged THRR unchanged      Progression   Progression Continue to progress workloads to maintain intensity without signs/symptoms of physical distress. Continue to progress workloads to maintain intensity without signs/symptoms of physical distress. Continue to progress workloads to maintain intensity without signs/symptoms of physical distress. Continue to progress workloads to maintain intensity without signs/symptoms of physical distress.      Resistance Training   Training Prescription Yes Yes Yes Yes    Weight red bands red bands red bands red bands    Reps 10-15 10-15 10-15 10-15    Time 10 Minutes 10 Minutes 10 Minutes 10 Minutes      NuStep   Level 2 2 2  --    SPM 76 -- 87 --    Minutes 15 15 15  --    METs  2 2.3 2.2 --      Arm Ergometer   Level -- -- -- 1    Minutes -- -- -- 15    METs -- -- -- 1.6      Track   Laps 8 8.5 9 --    Minutes 15 15 15  --    METs 1.92 1.92 2.38 --       Exercise Comments:   Exercise Goals and Review:   Exercise Goals     Row Name 12/20/23 1334             Exercise Goals   Increase Physical Activity Yes       Intervention Provide advice, education, support and counseling about physical activity/exercise needs.;Develop an individualized exercise prescription for aerobic and resistive training based on initial evaluation findings, risk stratification, comorbidities and participant's personal goals.       Expected Outcomes Short Term: Attend rehab on a regular basis to increase amount of physical activity.;Long Term: Add in home exercise to make exercise part of routine and to increase amount of physical activity.;Long Term: Exercising regularly at least 3-5 days a week.       Increase Strength and Stamina Yes       Intervention Provide advice, education, support and counseling about physical activity/exercise needs.;Develop an individualized exercise prescription for aerobic and resistive training based on initial evaluation findings, risk stratification, comorbidities and participant's personal goals.       Expected Outcomes Short Term: Increase workloads from initial exercise prescription for resistance, speed, and METs.;Short Term: Perform resistance training exercises routinely during rehab and add in resistance training at home;Long Term: Improve cardiorespiratory fitness, muscular endurance and strength as measured by increased METs and functional capacity ( )       Able to understand and use rate of perceived exertion (RPE) scale Yes       Intervention Provide education and explanation on how to use RPE scale       Expected Outcomes Short Term: Able to use RPE daily in rehab to express subjective intensity level;Long Term:  Able to use RPE to  guide intensity level when exercising independently       Able to understand and use Dyspnea scale Yes       Intervention Provide education and explanation on how to use Dyspnea scale  Expected Outcomes Short Term: Able to use Dyspnea scale daily in rehab to express subjective sense of shortness of breath during exertion;Long Term: Able to use Dyspnea scale to guide intensity level when exercising independently       Knowledge and understanding of Target Heart Rate Range (THRR) Yes       Intervention Provide education and explanation of THRR including how the numbers were predicted and where they are located for reference       Expected Outcomes Short Term: Able to state/look up THRR;Long Term: Able to use THRR to govern intensity when exercising independently;Short Term: Able to use daily as guideline for intensity in rehab       Understanding of Exercise Prescription Yes       Intervention Provide education, explanation, and written materials on patient's individual exercise prescription       Expected Outcomes Short Term: Able to explain program exercise prescription;Long Term: Able to explain home exercise prescription to exercise independently          Exercise Goals Re-Evaluation :  Exercise Goals Re-Evaluation     Row Name 12/22/23 [phone] 01/15/24 0916 02/09/24 0923         Exercise Goal Re-Evaluation   Exercise Goals Review Increase Physical Activity;Increase Strength and Stamina;Able to understand and use rate of perceived exertion (RPE) scale;Able to understand and use Dyspnea scale;Knowledge and understanding of Target Heart Rate Range (THRR);Understanding of Exercise Prescription Increase Physical Activity;Increase Strength and Stamina;Able to understand and use rate of perceived exertion (RPE) scale;Able to understand and use Dyspnea scale;Knowledge and understanding of Target Heart Rate Range (THRR);Understanding of Exercise Prescription Increase Physical Activity;Increase  Strength and Stamina;Able to understand and use rate of perceived exertion (RPE) scale;Able to understand and use Dyspnea scale;Knowledge and understanding of Target Heart Rate Range (THRR);Understanding of Exercise Prescription     Comments Buddy is schedule to begin exercise on 9/4. Will continue to monitor and progress as able. Buddy has completed 5 exercise sessions. He exercises for 15 min on the track and Nustep. Buddy averages 2.08 METs on the track and 2.2 METs at level 2 on the Nustep. He performs the warmup and cooldown standing holding onto a chair for balance. He has increased his laps on the track and level on the Nustep. METs have also increased. Will continue to monitor and progress as able. Buddy has completed 13 exercise sessions. He exercises for 15 min on the track and Nustep. Buddy averages 2.23 METs on the track and 2.6 METs at level 2 on the Nustep. He performs the warmup and cooldown standing holding onto a chair for balance. Buddy continues to slowly increase his track laps. His level on the Nustep remains the same. It is difficult to progress Buddy as I am unsure if he understands the RPE scale. Staff have reiterated the RPE scale every single exercise sessions. Buddy still rates exercise as very hard. Will discuss scale with pt again. Will continue to monitor and progress as able.     Expected Outcomes Through exercise at rehab and home, the patient will decrease shortness of breath with daily activities and feel confident in carrying out an exercise regimen at home. Through exercise at rehab and home, the patient will decrease shortness of breath with daily activities and feel confident in carrying out an exercise regimen at home. Through exercise at rehab and home, the patient will decrease shortness of breath with daily activities and feel confident in carrying out an exercise regimen at home.  Discharge Exercise Prescription (Final Exercise Prescription Changes):  Exercise  Prescription Changes - 02/20/24 1200       Response to Exercise   Blood Pressure (Admit) 110/64    Blood Pressure (Exercise) 132/78    Blood Pressure (Exit) 122/68    Heart Rate (Admit) 60 bpm    Heart Rate (Exercise) 76 bpm    Heart Rate (Exit) 65 bpm    Oxygen  Saturation (Admit) 96 %    Oxygen  Saturation (Exercise) 95 %    Oxygen  Saturation (Exit) 98 %    Rating of Perceived Exertion (Exercise) 16    Perceived Dyspnea (Exercise) 3    Duration Continue with 30 min of aerobic exercise without signs/symptoms of physical distress.    Intensity THRR unchanged      Progression   Progression Continue to progress workloads to maintain intensity without signs/symptoms of physical distress.      Resistance Training   Training Prescription Yes    Weight red bands    Reps 10-15    Time 10 Minutes      Arm Ergometer   Level 1    Minutes 15    METs 1.6          Nutrition:  Target Goals: Understanding of nutrition guidelines, daily intake of sodium 1500mg , cholesterol 200mg , calories 30% from fat and 7% or less from saturated fats, daily to have 5 or more servings of fruits and vegetables.  Biometrics:  Pre Biometrics - 12/20/23 1453       Pre Biometrics   Grip Strength 20 kg           Nutrition Therapy Plan and Nutrition Goals:   Nutrition Assessments:  MEDIFICTS Score Key: >=70 Need to make dietary changes  40-70 Heart Healthy Diet <= 40 Therapeutic Level Cholesterol Diet  Flowsheet Row PULMONARY REHAB OTHER RESPIRATORY from 08/18/2020 in [facility] for Heart, Vascular, & Lung Health  Picture Your Plate Total Score on Discharge 68   Picture Your Plate Scores: <59 Unhealthy dietary pattern with much room for improvement. 41-50 Dietary pattern unlikely to meet recommendations for good health and room for improvement. 51-60 More healthful dietary pattern, with some room for improvement.  >60 Healthy dietary pattern, although there may be  some specific behaviors that could be improved.    Nutrition Goals Re-Evaluation:   Nutrition Goals Discharge (Final Nutrition Goals Re-Evaluation):   Psychosocial: Target Goals: Acknowledge presence or absence of significant depression and/or stress, maximize coping skills, provide positive support system. Participant is able to verbalize types and ability to use techniques and skills needed for reducing stress and depression.  Initial Review & Psychosocial Screening:  Initial Psych Review & Screening - 12/20/23 1352       Initial Review   Current issues with None Identified      Family Dynamics   Good Support System? Yes      Screening Interventions   Interventions Encouraged to exercise          Quality of Life Scores:  Scores of 19 and below usually indicate a poorer quality of life in these areas.  A difference of  2-3 points is a clinically meaningful difference.  A difference of 2-3 points in the total score of the Quality of Life Index has been associated with significant improvement in overall quality of life, self-image, physical symptoms, and general health in studies assessing change in quality of life.  PHQ-9: Review Flowsheet       12/20/2023  06/19/2020  Depression screen PHQ 2/9  Decreased Interest 0 0  Down, Depressed, Hopeless 0 0  PHQ - 2 Score 0 0  Altered sleeping 0 0  Tired, decreased energy 1 3  Change in appetite 1 0  Feeling bad or failure about yourself  0 0  Trouble concentrating 0 1  Moving slowly or fidgety/restless 1 0  Suicidal thoughts 0 0  PHQ-9 Score 3 -  Difficult doing work/chores Very difficult Somewhat difficult   Interpretation of Total Score  Total Score Depression Severity:  1-4 = Minimal depression, 5-9 = Mild depression, 10-14 = Moderate depression, 15-19 = Moderately severe depression, 20-27 = Severe depression   Psychosocial Evaluation and Intervention:  Psychosocial Evaluation - 12/20/23 1441       Psychosocial  Evaluation & Interventions   Interventions Encouraged to exercise with the program and follow exercise prescription    Comments Buddy denies any psychosocial barriers or concerns at this time    Expected Outcomes For Buddy to participate in PR free of any psychosocial barriers or concerns    Continue Psychosocial Services  No Follow up required          Psychosocial Re-Evaluation:  Psychosocial Re-Evaluation     Row Name 12/26/23 0942 01/12/24 1503 02/12/24 1412         Psychosocial Re-Evaluation   Current issues with None Identified None Identified None Identified     Comments Buddy is scheduled to start PR on 12/28/23. No new concerns since orientation Buddy denies any new psychosocial barriers or concerns at this time. Buddy continues to deny any new psychosocial barriers or concerns at this time. He has good support from his wife and family.     Expected Outcomes For Buddy to participate in PR free of any psychosocial barriers or concerns For Buddy to participate in PR free of any psychosocial barriers or concerns For Buddy to participate in PR free of any psychosocial barriers or concerns     Interventions Encouraged to attend Pulmonary Rehabilitation for the exercise Encouraged to attend Pulmonary Rehabilitation for the exercise Encouraged to attend Pulmonary Rehabilitation for the exercise     Continue Psychosocial Services  No Follow up required No Follow up required No Follow up required        Psychosocial Discharge (Final Psychosocial Re-Evaluation):  Psychosocial Re-Evaluation - 02/12/24 1412       Psychosocial Re-Evaluation   Current issues with None Identified    Comments Buddy continues to deny any new psychosocial barriers or concerns at this time. He has good support from his wife and family.    Expected Outcomes For Buddy to participate in PR free of any psychosocial barriers or concerns    Interventions Encouraged to attend Pulmonary Rehabilitation for the exercise     Continue Psychosocial Services  No Follow up required          Education: Education Goals: Education classes will be provided on a weekly basis, covering required topics. Participant will state understanding/return demonstration of topics presented.  Learning Barriers/Preferences:  Learning Barriers/Preferences - 12/20/23 1353       Learning Barriers/Preferences   Learning Barriers Sight;Hearing   wears hearing aids   Learning Preferences None          Education Topics: Know Your Numbers Group instruction that is supported by a PowerPoint presentation. Instructor discusses importance of knowing and understanding resting, exercise, and post-exercise oxygen  saturation, heart rate, and blood pressure. Oxygen  saturation, heart rate, blood pressure, rating of perceived  exertion, and dyspnea are reviewed along with a normal range for these values.  Flowsheet Row PULMONARY REHAB OTHER RESPIRATORY from 01/11/2024 in [facility] for Heart, Vascular, & Lung Health  Date 01/11/24  Educator EP  Instruction Review Code 1- Verbalizes Understanding    Exercise for the Pulmonary Patient Group instruction that is supported by a PowerPoint presentation. Instructor discusses benefits of exercise, core components of exercise, frequency, duration, and intensity of an exercise routine, importance of utilizing pulse oximetry during exercise, safety while exercising, and options of places to exercise outside of rehab.  Flowsheet Row PULMONARY REHAB OTHER RESPIRATORY from 01/04/2024 in [facility] for Heart, Vascular, & Lung Health  Date 01/04/24  Educator EP  Instruction Review Code 1- Verbalizes Understanding    MET Level  Group instruction provided by PowerPoint, verbal discussion, and written material to support subject matter. Instructor reviews what METs are and how to increase METs.    Pulmonary Medications Verbally interactive group education  provided by instructor with focus on inhaled medications and proper administration. Flowsheet Row PULMONARY REHAB OTHER RESPIRATORY from 12/28/2023 in [facility] for Heart, Vascular, & Lung Health  Date 12/28/23  Educator RT  Instruction Review Code 1- Verbalizes Understanding    Anatomy and Physiology of the Respiratory System Group instruction provided by PowerPoint, verbal discussion, and written material to support subject matter. Instructor reviews respiratory cycle and anatomical components of the respiratory system and their functions. Instructor also reviews differences in obstructive and restrictive respiratory diseases with examples of each.  Flowsheet Row PULMONARY REHAB OTHER RESPIRATORY from 08/13/2020 in [facility] for Heart, Vascular, & Lung Health  Date 07/02/20  Educator Handout    Oxygen  Safety Group instruction provided by PowerPoint, verbal discussion, and written material to support subject matter. There is an overview of "What is Oxygen " and "Why do we need it".  Instructor also reviews how to create a safe environment for oxygen  use, the importance of using oxygen  as prescribed, and the risks of noncompliance. There is a brief discussion on traveling with oxygen  and resources the patient may utilize. Flowsheet Row PULMONARY REHAB OTHER RESPIRATORY from 01/18/2024 in [facility] for Heart, Vascular, & Lung Health  Date 01/18/24  Educator RN  Instruction Review Code 1- Verbalizes Understanding    Oxygen  Use Group instruction provided by PowerPoint, verbal discussion, and written material to discuss how supplemental oxygen  is prescribed and different types of oxygen  supply systems. Resources for more information are provided.  Flowsheet Row PULMONARY REHAB OTHER RESPIRATORY from 01/25/2024 in [facility] for Heart, Vascular, & Lung Health  Date 01/25/24  Educator RT  Instruction  Review Code 1- Verbalizes Understanding    Breathing Techniques Group instruction that is supported by demonstration and informational handouts. Instructor discusses the benefits of pursed lip and diaphragmatic breathing and detailed demonstration on how to perform both.     Risk Factor Reduction Group instruction that is supported by a PowerPoint presentation. Instructor discusses the definition of a risk factor, different risk factors for pulmonary disease, and how the heart and lungs work together.   Pulmonary Diseases Group instruction provided by PowerPoint, verbal discussion, and written material to support subject matter. Instructor gives an overview of the different type of pulmonary diseases. There is also a discussion on risk factors and symptoms as well as ways to manage the diseases.   Stress and Energy Conservation Group instruction provided  by PowerPoint, verbal discussion, and written material to support subject matter. Instructor gives an overview of stress and the impact it can have on the body. Instructor also reviews ways to reduce stress. There is also a discussion on energy conservation and ways to conserve energy throughout the day. Flowsheet Row PULMONARY REHAB OTHER RESPIRATORY from 02/08/2024 in [facility] for Heart, Vascular, & Lung Health  Date 02/08/24  Educator RN  Instruction Review Code 1- Verbalizes Understanding    Warning Signs and Symptoms Group instruction provided by PowerPoint, verbal discussion, and written material to support subject matter. Instructor reviews warning signs and symptoms of stroke, heart attack, cold and flu. Instructor also reviews ways to prevent the spread of infection. Flowsheet Row PULMONARY REHAB OTHER RESPIRATORY from 02/15/2024 in [facility] for Heart, Vascular, & Lung Health  Date 02/15/24  Educator RN  Instruction Review Code 1- Verbalizes Understanding    Other  Education Group or individual verbal, written, or video instructions that support the educational goals of the pulmonary rehab program. Flowsheet Row PULMONARY REHAB OTHER RESPIRATORY from 08/13/2020 in [facility] for Heart, Vascular, & Lung Health  Date 08/13/20  [Beat a Sedentary Lifestyle]  Educator handout  [Rate my plate]     Knowledge Questionnaire Score:  Knowledge Questionnaire Score - 12/20/23 1336       Knowledge Questionnaire Score   Pre Score 15/18          Core Components/Risk Factors/Patient Goals at Admission:  Personal Goals and Risk Factors at Admission - 12/20/23 1353       Core Components/Risk Factors/Patient Goals on Admission   Improve shortness of breath with ADL's Yes    Intervention Provide education, individualized exercise plan and daily activity instruction to help decrease symptoms of SOB with activities of daily living.    Expected Outcomes Short Term: Improve cardiorespiratory fitness to achieve a reduction of symptoms when performing ADLs;Long Term: Be able to perform more ADLs without symptoms or delay the onset of symptoms          Core Components/Risk Factors/Patient Goals Review:   Goals and Risk Factor Review     Row Name 12/26/23 0944 01/12/24 1503 02/12/24 1413         Core Components/Risk Factors/Patient Goals Review   Personal Goals Review Improve shortness of breath with ADL's;Develop more efficient breathing techniques such as purse lipped breathing and diaphragmatic breathing and practicing self-pacing with activity. Improve shortness of breath with ADL's;Develop more efficient breathing techniques such as purse lipped breathing and diaphragmatic breathing and practicing self-pacing with activity. Improve shortness of breath with ADL's     Review Buddy has not started PR yet. Unable to assess his goals at this time. Monthly review of patient's Core Components/Risk Factors/Patient Goals are as follows: Goal  progressing for improving shortness of breath. Goal progressing for developing more efficient breathing techniques such as purse lipped breathing and diaphragmatic breathing; and practicing self-pacing with activity. Buddy is currently exercising on RA to keep sats >88%. We will continue to monitor his progress thorughout the program. Monthly review of patient's Core Components/Risk Factors/Patient Goals are as follows: Goal progressing for improving shortness of breath with ADL's. Buddy is currently exercising on RA to keep sats >88%. Goal met for developing more efficient breathing techniques such as purse lipped breathing and diaphragmatic breathing; and practicing self-pacing with activity. He has attended the breathing technique class and has been practicing diaphragmatic breathing at home. He  demonstrates PLB when he becomes SOB. He knows how to self pace based on his RPE/dyspnea score. We will continue to monitor his progress thorughout the program.     Expected Outcomes See admission goals. To improve shortness of breath with ADL's and develop more efficient breathing techniques such as purse lipped breathing and diaphragmatic breathing; and practicing self-pacing with activity. To improve shortness of breath with ADL's        Core Components/Risk Factors/Patient Goals at Discharge (Final Review):   Goals and Risk Factor Review - 02/12/24 1413       Core Components/Risk Factors/Patient Goals Review   Personal Goals Review Improve shortness of breath with ADL's    Review Monthly review of patient's Core Components/Risk Factors/Patient Goals are as follows: Goal progressing for improving shortness of breath with ADL's. Buddy is currently exercising on RA to keep sats >88%. Goal met for developing more efficient breathing techniques such as purse lipped breathing and diaphragmatic breathing; and practicing self-pacing with activity. He has attended the breathing technique class and has been  practicing diaphragmatic breathing at home. He demonstrates PLB when he becomes SOB. He knows how to self pace based on his RPE/dyspnea score. We will continue to monitor his progress thorughout the program.    Expected Outcomes To improve shortness of breath with ADL's          ITP Comments:   Comments: Pt is making expected progress toward Pulmonary Rehab goals after completing 16 session(s). Recommend continued exercise, life style modification, education, and utilization of breathing techniques to increase stamina and strength, while also decreasing shortness of breath with exertion.  Dr. Slater Staff is Medical Director for Pulmonary Rehab at [facility].

## 2024-02-22 ENCOUNTER — Encounter (HOSPITAL_COMMUNITY)
Admission: RE | Admit: 2024-02-22 | Discharge: 2024-02-22 | Disposition: A | Discharge status: Home / Self Care | Source: Ambulatory Visit | Attending: Pulmonary Disease | Admitting: Pulmonary Disease

## 2024-02-22 DIAGNOSIS — J8410 Pulmonary fibrosis, unspecified: Secondary | ICD-10-CM | POA: Diagnosis not present

## 2024-02-22 NOTE — Progress Notes (Signed)
 Daily Session Note  Patient Details  Name: Alex [name]. MRN: [id number] Date of Birth: 1940-08-10 Referring Provider:   Conrad Ports Pulmonary Rehab Walk Test from 12/20/2023 in [facility] for Heart, Vascular, & Lung Health  Referring Provider Dr. Kara    Encounter Date: 02/22/2024  Check In:  Session Check In - 02/22/24 1041       Check-In   Supervising physician immediately available to respond to emergencies [facility] MD immediately available    Physician(s) Rosaline Bane, NP    Location [facility]    Staff Present Ronal Levin, RN, BSN;Casey Claudene, RT;Randi [facility] BS, ACSM-CEP, Exercise Physiologist;Octavio Matheney Nicholaus, MS, ACSM-CEP, Exercise Physiologist    Virtual Visit No    Medication changes reported     No    Fall or balance concerns reported    No    Tobacco Cessation No Change    Warm-up and Cool-down Performed as group-led instruction    Resistance Training Performed Yes    VAD Patient? No    PAD/SET Patient? No      Pain Assessment   Currently in Pain? No/denies          Capillary Blood Glucose: No results found for this or any previous visit (from the past 24 hours).    Social History   Tobacco Use  Smoking Status Former   Current packs/day: 0.00   Average packs/day: 3.0 packs/day for 20.0 years (60.0 ttl pk-yrs)   Types: Cigarettes   Start date: 04/25/1949   Quit date: 04/25/1969   Years since quitting: 54.8  Smokeless Tobacco Former   Types: Chew   Quit date: 11/07/1983    Goals Met:  Proper associated with RPD/PD & O2 Sat Exercise tolerated well No report of concerns or symptoms today Strength training completed today  Goals Unmet:  Not Applicable  Comments: Service time is from 1015 to 1140.    Dr. Slater Staff is Medical Director for Pulmonary Rehab at [facility].

## 2024-02-27 ENCOUNTER — Encounter (HOSPITAL_COMMUNITY)
Admission: RE | Admit: 2024-02-27 | Discharge: 2024-02-27 | Disposition: A | Discharge status: Home / Self Care | Source: Ambulatory Visit | Attending: Pulmonary Disease | Admitting: Pulmonary Disease

## 2024-02-27 ENCOUNTER — Ambulatory Visit (HOSPITAL_COMMUNITY)

## 2024-02-27 DIAGNOSIS — J8410 Pulmonary fibrosis, unspecified: Secondary | ICD-10-CM | POA: Insufficient documentation

## 2024-02-27 NOTE — Progress Notes (Signed)
 Daily Session Note  Patient Details  Name: Alex [name]. MRN: [id number] Date of Birth: 03/25/1941 Referring Provider:   Conrad Ports Pulmonary Rehab Walk Test from 12/20/2023 in [facility] for Heart, Vascular, & Lung Health  Referring Provider Dr. Kara    Encounter Date: 02/27/2024  Check In:  Session Check In - 02/27/24 1326       Check-In   Supervising physician immediately available to respond to emergencies [facility] MD immediately available    Physician(s) Rosaline Bane, NP    Location [facility]    Staff Present Ronal Levin, RN, BSN;Casey Claudene, RT;Rolland [facility] BS, ACSM-CEP, Exercise Physiologist;Kaylee Nicholaus, MS, ACSM-CEP, Exercise Physiologist    Virtual Visit No    Medication changes reported     No    Fall or balance concerns reported    No    Tobacco Cessation No Change    Warm-up and Cool-down Performed as group-led instruction    Resistance Training Performed Yes    VAD Patient? No    PAD/SET Patient? No      Pain Assessment   Currently in Pain? No/denies    Pain Score 0-No pain    Multiple Pain Sites No          Capillary Blood Glucose: No results found for this or any previous visit (from the past 24 hours).    Social History   Tobacco Use  Smoking Status Former   Current packs/day: 0.00   Average packs/day: 3.0 packs/day for 20.0 years (60.0 ttl pk-yrs)   Types: Cigarettes   Start date: 04/25/1949   Quit date: 04/25/1969   Years since quitting: 54.8  Smokeless Tobacco Former   Types: Chew   Quit date: 11/07/1983    Goals Met:  Exercise tolerated well No report of concerns or symptoms today Strength training completed today  Goals Unmet:  Not Applicable  Comments: Service time is from 1317 to 1440.    Dr. Slater Staff is Medical Director for Pulmonary Rehab at [facility].

## 2024-02-29 ENCOUNTER — Encounter (HOSPITAL_COMMUNITY)
Admission: RE | Admit: 2024-02-29 | Discharge: 2024-02-29 | Disposition: A | Source: Ambulatory Visit | Attending: Pulmonary Disease

## 2024-02-29 DIAGNOSIS — J8410 Pulmonary fibrosis, unspecified: Secondary | ICD-10-CM | POA: Diagnosis not present

## 2024-02-29 NOTE — Progress Notes (Signed)
 Daily Session Note  Patient Details  Name: Alex [name]. MRN: [id number] Date of Birth: 1940-08-02 Referring Provider:   Conrad Ports Pulmonary Rehab Walk Test from 12/20/2023 in [facility] for Heart, Vascular, & Lung Health  Referring Provider Dr. Kara    Encounter Date: 02/29/2024  Check In:  Session Check In - 02/29/24 1134       Check-In   Supervising physician immediately available to respond to emergencies [facility] MD immediately available    Physician(s) Orren Fabry, NP    Location [facility]    Staff Present Augustin Sharps, Neita Moats, MS, ACSM-CEP, Exercise Physiologist;Carlette Bernett, RN, Mallory Parkins, MS, ACSM-CEP, CCRP, Exercise Physiologist;Maria Whitaker, RN, BSN    Virtual Visit No    Medication changes reported     No    Fall or balance concerns reported    No    Tobacco Cessation No Change    Warm-up and Cool-down Performed as group-led instruction    Resistance Training Performed Yes    VAD Patient? No    PAD/SET Patient? No      Pain Assessment   Currently in Pain? No/denies    Multiple Pain Sites No          Capillary Blood Glucose: No results found for this or any previous visit (from the past 24 hours).    Social History   Tobacco Use  Smoking Status Former   Current packs/day: 0.00   Average packs/day: 3.0 packs/day for 20.0 years (60.0 ttl pk-yrs)   Types: Cigarettes   Start date: 04/25/1949   Quit date: 04/25/1969   Years since quitting: 54.8  Smokeless Tobacco Former   Types: Chew   Quit date: 11/07/1983    Goals Met:  Proper associated with RPD/PD & O2 Sat Independence with exercise equipment Exercise tolerated well No report of concerns or symptoms today Strength training completed today  Goals Unmet:  Not Applicable  Comments: Service time is from 1025 to 1146.    Dr. Slater Staff is Medical Director for Pulmonary Rehab at [facility].

## 2024-03-05 ENCOUNTER — Encounter (HOSPITAL_COMMUNITY)
Admission: RE | Admit: 2024-03-05 | Discharge: 2024-03-05 | Disposition: A | Discharge status: Home / Self Care | Source: Ambulatory Visit | Attending: Pulmonary Disease | Admitting: Pulmonary Disease

## 2024-03-05 VITALS — Wt 182.3 lb

## 2024-03-05 DIAGNOSIS — J8410 Pulmonary fibrosis, unspecified: Secondary | ICD-10-CM | POA: Diagnosis not present

## 2024-03-05 NOTE — Progress Notes (Signed)
 Daily Session Note  Patient Details  Name: Alex [name]. MRN: [id number] Date of Birth: [birth year] Referring Provider:   Conrad Ports Pul[facility] Rehab Walk Test from 12/20/2023 in [facility]  Referring Provider Dr. Kara    Encounter Date: 03/05/2024  Check In:  Session Check In - 03/05/24 1107       Check-In   Supervising physician immediately available to respond to emergencies [facility] MD immediately available    Physician(s) Barnie Press, NP    Location [facility]    Staff Present Augustin Sharps, Neita Moats, MS, ACSM-CEP, Exercise Physiologist;Mary Harvy, RN, BSN;Johnny Fayette, MS, Exercise Physiologist;Randi Midge BS, ACSM-CEP, Exercise Physiologist    Virtual Visit No    Medication changes reported     No    Fall or balance concerns reported    No    Tobacco Cessation No Change    Warm-up and Cool-down Performed as group-led instruction    Resistance Training Performed Yes    VAD Patient? No    PAD/SET Patient? No      Pain Assessment   Currently in Pain? No/denies    Multiple Pain Sites No          Capillary Blood Glucose: No results found for this or any previous visit (from the past 24 hours).   Exercise Prescription Changes - 03/05/24 1100       Response to Exercise   Blood Pressure (Admit) 92/64    Blood Pressure (Exercise) 116/62    Blood Pressure (Exit) 110/60    Heart Rate (Admit) 88 bpm    Heart Rate (Exercise) 86 bpm    Heart Rate (Exit) 72 bpm    Oxygen  Saturation (Admit) 95 %    Oxygen  Saturation (Exercise) 96 %    Oxygen  Saturation (Exit) 98 %    Rating of Perceived Exertion (Exercise) 17    Perceived Dyspnea (Exercise) 3    Duration Continue with 30 min of aerobic exercise without signs/symptoms of physical distress.    Intensity THRR unchanged      Progression   Progression Continue to progress workloads to maintain intensity without  signs/symptoms of physical distress.      Resistance Training   Training Prescription Yes    Weight red bands    Reps 10-15    Time 10 Minutes      NuStep   Level 2    SPM 84    Minutes 15    METs 2.9          Social History   Tobacco Use  Smoking Status Former   Current packs/day: 0.00   Average packs/day: 3.0 packs/day for 20.0 years (60.0 ttl pk-yrs)   Types: Cigarettes   Start date: 04/25/1949   Quit date: 04/25/1969   Years since quitting: 54.8  Smokeless Tobacco Former   Types: Chew   Quit date: 11/07/1983    Goals Met:  Proper associated with RPD/PD & O2 Sat [place] with exercise equipment Exercise tolerated well No report of concerns or symptoms today Strength training completed today  Goals Unmet:  Not Applicable  Comments: Service time is from 1008 to 1057.    Dr. Slater Staff is Medical Director for Pulmonary Rehab at [facility].

## 2024-03-07 ENCOUNTER — Encounter (HOSPITAL_COMMUNITY)
Admission: RE | Admit: 2024-03-07 | Discharge: 2024-03-07 | Disposition: A | Source: Ambulatory Visit | Attending: Pulmonary Disease

## 2024-03-07 DIAGNOSIS — J8410 Pulmonary fibrosis, unspecified: Secondary | ICD-10-CM

## 2024-03-07 NOTE — Progress Notes (Signed)
 Daily Session Note  Patient Details  Name: Alex [name]. MRN: [id number] Date of Birth: 04-11-1941 Referring Provider:   Conrad Ports Pulmonary Rehab Walk Test from 12/20/2023 in [facility]  Referring Provider Dr. Kara    Encounter Date: 03/07/2024  Check In:  Session Check In - 03/07/24 1134       Check-In   Supervising physician immediately available to respond to emergencies [facility] MD immediately available    Physician(s) Josefa Beauvais, NP    Location [facility]    Staff Present Augustin Sharps, Neita Moats, MS, ACSM-CEP, Exercise Physiologist;Mary Harvy, RN, BSN;Randi Reeve BS, ACSM-CEP, Exercise Physiologist    Virtual Visit No    Medication changes reported     No    Fall or balance concerns reported    No    Tobacco Cessation No Change    Warm-up and Cool-down Performed as group-led instruction    Resistance Training Performed No    VAD Patient? No    PAD/SET Patient? No      Pain Assessment   Currently in Pain? No/denies    Multiple Pain Sites No          Capillary Blood Glucose: No results found for this or any previous visit (from the past 24 hours).    Social History   Tobacco Use  Smoking Status Former   Current packs/day: 0.00   Average packs/day: 3.0 packs/day for 20.0 years (60.0 ttl pk-yrs)   Types: Cigarettes   Start date: 04/25/1949   Quit date: 04/25/1969   Years since quitting: 54.9  Smokeless Tobacco Former   Types: Chew   Quit date: 11/07/1983    Goals Met:  Proper associated with RPD/PD & O2 Sat [place] with exercise equipment Exercise tolerated well No report of concerns or symptoms today Strength training completed today  Goals Unmet:  Not Applicable  Comments: Service time is from 1010 to 1150.    Dr. Slater Staff is Medical Director for Pulmonary Rehab at [facility].

## 2024-03-08 ENCOUNTER — Ambulatory Visit (HOSPITAL_COMMUNITY)
Admission: RE | Admit: 2024-03-08 | Discharge: 2024-03-08 | Disposition: A | Discharge status: Home / Self Care | Source: Ambulatory Visit

## 2024-03-08 DIAGNOSIS — J8410 Pulmonary fibrosis, unspecified: Secondary | ICD-10-CM | POA: Insufficient documentation

## 2024-03-08 DIAGNOSIS — C61 Malignant neoplasm of prostate: Secondary | ICD-10-CM | POA: Diagnosis not present

## 2024-03-08 DIAGNOSIS — I700 Atherosclerosis of aorta: Secondary | ICD-10-CM | POA: Diagnosis not present

## 2024-03-08 DIAGNOSIS — R911 Solitary pulmonary nodule: Secondary | ICD-10-CM | POA: Diagnosis not present

## 2024-03-11 ENCOUNTER — Encounter: Payer: Self-pay | Admitting: Cardiovascular Disease

## 2024-03-11 ENCOUNTER — Ambulatory Visit: Attending: Cardiovascular Disease | Admitting: Cardiovascular Disease

## 2024-03-11 VITALS — BP 110/70 | HR 71 | Ht 71.0 in | Wt 185.0 lb

## 2024-03-11 DIAGNOSIS — I25118 Atherosclerotic heart disease of native coronary artery with other forms of angina pectoris: Secondary | ICD-10-CM

## 2024-03-11 DIAGNOSIS — I350 Nonrheumatic aortic (valve) stenosis: Secondary | ICD-10-CM

## 2024-03-11 DIAGNOSIS — I10 Essential (primary) hypertension: Secondary | ICD-10-CM

## 2024-03-11 DIAGNOSIS — E7800 Pure hypercholesterolemia, unspecified: Secondary | ICD-10-CM | POA: Diagnosis not present

## 2024-03-11 NOTE — Patient Instructions (Signed)
 Medication Instructions:  Your physician recommends that you continue on your current medications as directed. Please refer to the Current Medication list given to you today.  *If you need a refill on your cardiac medications before your next appointment, please call your pharmacy*  Lab Work: none If you have labs (blood work) drawn today and your tests are completely normal, you will receive your results only by: MyChart Message (if you have MyChart) OR A paper copy in the mail If you have any lab test that is abnormal or we need to change your treatment, we will call you to review the results.  Testing/Procedures: Your physician has requested that you have an echocardiogram in March 2026. Echocardiography is a painless test that uses sound waves to create images of your heart. It provides your doctor with information about the size and shape of your heart and how well your heart's chambers and valves are working. This procedure takes approximately one hour. There are no restrictions for this procedure. Please do NOT wear cologne, perfume, aftershave, or lotions (deodorant is allowed). Please arrive 15 minutes prior to your appointment time.  Please note: We ask at that you not bring children with you during ultrasound (echo/ vascular) testing. Due to room size and safety concerns, children are not allowed in the ultrasound rooms during exams. Our front office staff cannot provide observation of children in our lobby area while testing is being conducted. An adult accompanying a patient to their appointment will only be allowed in the ultrasound room at the discretion of the ultrasound technician under special circumstances. We apologize for any inconvenience.   Follow-Up: At [facility], you and your health needs are our priority.  As part of our continuing mission to provide you with exceptional heart care, our providers are all part of one team.  This team includes your primary  Cardiologist (physician) and Advanced Practice Providers or APPs (Physician Assistants and Nurse Practitioners) who all work together to provide you with the care you need, when you need it.  Your next appointment:   March/April after echo  Provider:   Lonni Cash, MD    We recommend signing up for the patient portal called MyChart.  Sign up information is provided on this After Visit Summary.  MyChart is used to connect with patients for Virtual Visits (Telemedicine).  Patients are able to view lab/test results, encounter notes, upcoming appointments, etc.  Non-urgent messages can be sent to your provider as well.   To learn more about what you can do with MyChart, go to [email or web address].   Other Instructions

## 2024-03-11 NOTE — Progress Notes (Signed)
 Chief Complaint  Patient presents with   Follow-up    Aortic stenosis, CAD   History of Present Illness: 83 yo male with history of amyloidosis with nephrotic syndrome, HTN, moderate non-obstructive CAD by cath 2011, aortic stenosis, GERD, temporal arteritis, hyperlipidemia and carotid artery disease who is here today for follow up. Cardiac cath in 2011 and in 2021 with mild non-obstructive CAD (Last cath was at the [facility] in October 2021-scanned in under cath reports) . [facility]. He was diagnosed with amyloidosis in April 2018 treated with chemotherapy for 8 months, then stem cell transplant December 2018 followed by chemotherapy. He was admitted to [facility] July 2020 with complications from Covid 19. He developed respiratory failure and was treated with Remdesivir , steroids, Actemra  and plasma. He was diagnosed with a pneumomediastinum. He was found to have a pulmonary embolism and was placed on Eliquis . He is now followed in the pulmonary office for pulmonary fibrosis. Echo in 2024 with normal LV function and moderate low flow/low gradient aortic stenosis.   Most recent echo September 2025 with LVEF=60-65%. Normal RV function. 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 Magda.  He is here today for follow up. He has chest pains that come and go with exertion. There is no change in the nature or frequency of chest pain over the past few years. The chest pain is a pressure. He is followed in the pulmonary office for pulmonary fibrosis. He has baseline dyspnea at rest with no recent change in severity. He is participating in pulmonary rehab. He denies any palpitations, lower extremity edema, orthopnea, PND, dizziness, near syncope or syncope.   Primary Care Physician: Loreli Elsie JONETTA Mickey., MD  Past Medical History:  Diagnosis Date   AL amyloidosis [facility]) oncologist-- dr onesimo   Renal and Systemic , dx 04/ 2018;  renal bx 08-05-2016 and  bone marrow bx 08-29-2016;  8 month chemotherapy then stell cell transplant 12/ 2018 then chemo again;  since then been on oral chemo daily (11-07-2019 per pt has been on hold until after prostate seed implants)   Arthritis    Atypical chest pain    CAD (coronary artery disease) cardiologist-- dr verlin;  [facility] [place] dr d. sarah   moderate nonobstrucive disease by cath 2011; Lexiscan  Myoview  6/14--Normal study, no scar or ischemia, EF 62%;  11-14-2014 nuclear study low risk w/ normal perfusion, nuclear ef 67%   CKD (chronic kidney disease), stage III (HCC)    COVID    b/l PNA (7/20)- 21 day hospitalization tx with steroids, Remdesevir, Actemra , Hydroxychloroquine ) --> PE on Eliquis .   Diverticulosis of colon    Dyspnea    WITH EXERTION    ED (erectile dysfunction)    Fatty liver    GERD (gastroesophageal reflux disease)    H/O stem cell transplant (HCC) 03/2017   Headache    Heart murmur    Hiatal hernia    History of 2019 novel coronavirus disease (COVID-19) 09/2018   [facility], hospital admission 10-28-2019 respiratory failure/ pneumometrostinom/ PE ;  was not intubated , treatment with remdesivir / steroids/ actemra / plasma   History of basal cell carcinoma (BCC)    History of colon polyps    History of MRSA infection    culture positive   History of pulmonary embolus (PE) 10/2018   with covid 19, completed treatment with eliquis   History of seizures    11-07-2019  per pt last one 1970s, unknown cause   History of vertebral compression fracture 1988   lumbar   Hx of adenomatous colonic polyps    negative in 7/08   Hyperlipidemia    Hypertension    IFG (impaired fasting glucose)    Neuropathy due to chemotherapeutic drug    fingers tips and feet   Nocturia    On supplemental oxygen  therapy    post covid 19;   11-07-2019 pt uses O2 every night 2L via [place],  and uses supplements during the day when walking  around neighborhood and goes out to appointments/ store that he has to walk a distance   OSA (obstructive sleep apnea)    study in epic 03-18-2005 mild to moderate   (11-07-2019  per pt used cpap up until when he lost alot of weight, stoped approx. 2012   Pneumonia    Prostate cancer [facility] [place]) urologist--- dr pace/  oncology-- dr patrcia   dx 03/ 2021   Pulmonary fibrosis (HCC) 09/2018   pulmonologist-- dr theophilus;   post covid 19   Seizure (HCC)    1st in 1976; last in 1978 GTC x 2    SOB (shortness of breath) on exertion    11-07-2019  per pt walks around neighborhood for 30 minutes twice daily, he does have to stop frequently d/t sob and since weather has been hot he uses his supplemental oxygen ;  stated he has a pulse ox. at home , when he is sitting down O2 sat 96 -97% on RA when up walking around the house O2 sat 93-94% on RA;  pt stated he carry's his O2 w/ him when he goes to doctor appts/store d/t distance   Wears glasses    Wears hearing aid in both ears     Past Surgical History:  Procedure Laterality Date   ARTERY BIOPSY Right 09/17/2020   Procedure: RIGHT TEMPORAL ARTERY BIOPSY;  Surgeon: Eletha Boas, MD;  Location: [facility];  Service: General;  Laterality: Right;   CARDIAC CATHETERIZATION  05-21-1999  and 12-29-2000  @[facility]   dr obie bud nuclear study;  moderate nonobstructive disease involving LAD and LCx;  second done due to in Reversal research trial   CARDIAC CATHETERIZATION  07/07/2009  dr verlin   nonobstructive disease w/ normal LVF   CARPAL TUNNEL RELEASE Left 1994   CERVICAL FUSION  1988   C2 -- 4   CYSTOSCOPY N/A 11/13/2019   Procedure: CYSTOSCOPY FLEXIBLE;  Surgeon: Elisabeth Valli BIRCH, MD;  Location: [facility] [place];  Service: Urology;  Laterality: N/A;  no seeds detected per Dr. Elisabeth   ESOPHAGOGASTRODUODENOSCOPY N/A 10/19/2023   Procedure: EGD (ESOPHAGOGASTRODUODENOSCOPY);  Surgeon: San Sandor GAILS, DO;  Location: [facility];  Service:  Gastroenterology;  Laterality: N/A;   FOOT SURGERY Bilateral x3 last one 1980s   bunionectomy great toe--- x2 right and x1 left   KNEE ARTHROSCOPY Left 1996   NASAL SEPTUM SURGERY  x3 last one 08-09-2010 @ [facility]   PAROTIDECTOMY Right 03/09/2017   RADIOACTIVE SEED IMPLANT N/A 11/13/2019   Procedure: RADIOACTIVE SEED IMPLANT/BRACHYTHERAPY IMPLANT;  Surgeon: Elisabeth Valli BIRCH, MD;  Location: [facility] [place];  Service: Urology;  Laterality: N/A;  ONLY NEEDS 90 MIN FOR ALL PROCEDURES   REVERSE SHOULDER ARTHROPLASTY Right 07/08/2022   Procedure: REVERSE SHOULDER ARTHROPLASTY;  Surgeon: Kay Kemps, MD;  Location: [facility];  Service: Orthopedics;  Laterality: Right;   ROTATOR CUFF REPAIR Bilateral  x1 rigth (unsure date);  x2 left last one 09-07-2009 @[facility] ;  right   SPACE OAR INSTILLATION N/A 11/13/2019   Procedure: SPACE OAR INSTILLATION;  Surgeon: Elisabeth Valli BIRCH, MD;  Location: [facility];  Service: Urology;  Laterality: N/A;   TRIGGER FINGER RELEASE Bilateral x2  last one 2001 approx.   x1 finger left hand;  x2 fingers right hand   UMBILICAL HERNIA REPAIR  01/19/2011   VASECTOMY  approx. 1968 (with anesthesia)    Current Outpatient Medications  Medication Sig Dispense Refill   albuterol  (ACCUNEB ) 1.25 MG/3ML nebulizer solution Take 1 ampule by nebulization every 6 (six) hours as needed for shortness of breath or wheezing.     apixaban  (ELIQUIS ) 5 MG TABS tablet Take 5 mg by mouth 2 (two) times daily.     Ascorbic Acid  (VITAMIN C ) 1000 MG tablet Take 1,000 mg by mouth daily.     atorvastatin  (LIPITOR) 40 MG tablet Take 40 mg by mouth daily.     capsaicin (ZOSTRIX) 0.025 % cream Apply 1 Application topically as needed (aches and pains).     carvedilol  (COREG ) 6.25 MG tablet Take 3.125-6.25 mg by mouth See admin instructions. 6.25 mg in the morning, 3.125 mg in the evening     Cholecalciferol  125 MCG (5000 UT) TABS Take 5,000 Units by mouth 2 (two) times a day.      diclofenac Sodium (VOLTAREN) 1 % GEL Apply topically.     ezetimibe  (ZETIA ) 10 MG tablet Take 10 mg by mouth daily.     fluticasone  (FLONASE ) 50 MCG/ACT nasal spray Place 2 sprays into both nostrils daily. 16 g 5   furosemide  (LASIX ) 40 MG tablet Take 40 mg by mouth daily.      Ipratropium-Albuterol  (COMBIVENT RESPIMAT) 20-100 MCG/ACT AERS respimat Inhale 1 puff into the lungs every 6 (six) hours as needed for wheezing or shortness of breath.     isosorbide  mononitrate (IMDUR ) 30 MG 24 hr tablet Take 30 mg by mouth daily.     lidocaine  (LIDODERM ) 5 % Place 1 patch onto the skin daily as needed (pain).     Multiple Vitamin (MULTI-VITAMINS) TABS Take 1 tablet by mouth at bedtime.     mupirocin  ointment (BACTROBAN ) 2 % Apply 1 Application topically daily as needed (wound care).     nitroGLYCERIN  (NITROSTAT ) 0.4 MG SL tablet DISSOLVE ONE TABLET UNDER TONGUE EVERY 5 MINUTES AS NEEDED UP TO 3 DOSES, IF NORELIEF CALL 911 25 tablet 11   pantoprazole  (PROTONIX ) 40 MG tablet Take 1 tablet (40 mg total) by mouth 2 (two) times daily. 180 tablet 1   potassium chloride  SA (KLOR-CON ) 20 MEQ tablet Take 20 mEq by mouth daily.     Probiotic Product (PROBIOTIC-10 PO) Take 1 capsule by mouth daily.     tamsulosin  (FLOMAX ) 0.4 MG CAPS capsule Take 0.4 mg by mouth 2 (two) times daily.     triamcinolone  cream (KENALOG ) 0.1 % Apply 1 Application topically daily as needed (irritation).     vitamin B-12 (CYANOCOBALAMIN) 250 MCG tablet Take 500 mcg by mouth daily.     zinc gluconate 50 MG tablet Take 50 mg by mouth daily.     No current facility-administered medications for this visit.    Allergies  Allergen Reactions   Ramipril Cough    Other reaction(s): Cough (ALLERGY/intolerance)   Augmentin [Amoxicillin-Pot Clavulanate] Diarrhea    Social History   Socioeconomic History   Marital status: Married    Spouse name: Not on file  Number of children: 2   Years of education: 12   Highest education level: Not  on file  Occupational History   Occupation: Retired  Tobacco Use   Smoking status: Former    Current packs/day: 0.00    Average packs/day: 3.0 packs/day for 20.0 years (60.0 ttl pk-yrs)    Types: Cigarettes    Start date: 04/25/1949    Quit date: 04/25/1969    Years since quitting: 54.9   Smokeless tobacco: Former    Types: Chew    Quit date: 11/07/1983  Vaping Use   Vaping status: Never Used  Substance and Sexual Activity   Alcohol  use: Yes    Comment: RARE   Drug use: Never   Sexual activity: Not Currently    Comment: SHIM 1. Reports erectile dysfunction.;    Other Topics Concern   Not on file  Social History Narrative   ** Merged History Encounter **   Lives at home w/ his wife   Right-handed   Caffeine: Dr Nunzio 16 oz/week   Social Drivers of Health   Financial Resource Strain: Not on file  Food Insecurity: Patient Declined (07/08/2022)   Hunger Vital Sign    Worried About Running Out of Food in the Last Year: Patient declined    Ran Out of Food in the Last Year: Patient declined  Transportation Needs: No Transportation Needs (07/08/2022)   PRAPARE - Administrator, Civil Service (Medical): No    Lack of Transportation (Non-Medical): No  Physical Activity: Not on file  Stress: Not on file  Social Connections: Not on file  Intimate Partner Violence: Not At Risk (07/08/2022)   Humiliation, Afraid, Rape, and Kick questionnaire    Fear of Current or Ex-Partner: No    Emotionally Abused: No    Physically Abused: No    Sexually Abused: No    Family History  Problem Relation Age of Onset   Hearing loss Mother    Diabetes Father    Heart disease Father    Heart attack Father 71   Hypertension Father    Cancer Sister        1 sister had cancer but patient unsure of type   Eczema Sister    Hypertension Sister    Thyroid  disease Sister    Hyperlipidemia Other    Seizures Other    Colon cancer Neg Hx    Stroke Neg Hx    Breast cancer Neg Hx     Pancreatic cancer Neg Hx    Prostate cancer Neg Hx     Review of Systems:  As stated in the HPI and otherwise negative.   BP 110/70   Pulse 71   Ht 5' 11 (1.803 m)   Wt 185 lb (83.9 kg)   SpO2 97%   BMI 25.80 kg/m   Physical Examination: General: Well developed, well nourished, NAD  HEENT: OP clear, mucus membranes moist  SKIN: warm, dry. No rashes. Neuro: No focal deficits  Musculoskeletal: Muscle strength 5/5 all ext  Psychiatric: Mood and affect normal  Neck: No JVD, no carotid bruits, no thyromegaly, no lymphadenopathy.  Lungs: Opening crackles diffusely.  Cardiovascular: Regular rate and rhythm. Harsh systolic murmur.  Abdomen:Soft. Bowel sounds present. Non-tender.  Extremities: No lower extremity edema.   EKG:  EKG is performed today and demonstrates  EKG Interpretation Date/Time:  Monday March 11 2024 10:44:53 EST Ventricular Rate:  71 PR Interval:  168 QRS Duration:  98 QT Interval:  394 QTC Calculation:  428 R Axis:   -84  Text Interpretation: Sinus rhythm with occasional Premature ventricular complexes Left axis deviation Premature ventricular complexes are now Present Inferior T wave abnormality Poor R wave progression Confirmed by Verlin Bruckner [phone]) on 03/11/2024 10:57:03 AM   Echo 01/16/24:  1. Left ventricular ejection fraction, by estimation, is 60 to 65%. The  left ventricle has normal function. The left ventricle has no regional  wall motion abnormalities. There is moderate left ventricular hypertrophy.  Left ventricular diastolic  parameters are consistent with Grade I diastolic dysfunction (impaired  relaxation).   2. Right ventricular systolic function is normal. The right ventricular  size is normal. There is normal pulmonary artery systolic pressure. The  estimated right ventricular systolic pressure is 18.5 mmHg.   3. The mitral valve is normal in structure. Trivial mitral valve  regurgitation. No evidence of mitral stenosis.    4. The inferior vena cava is normal in size with greater than 50%  respiratory variability, suggesting right atrial pressure of 3 mmHg.   5. The aortic valve is calcified. There is severe calcifcation of the  aortic valve. Aortic valve regurgitation is trivial. Moderate to severe  aortic valve stenosis. Mild AS by gradients (Vmax 2.43m/s, MG ).  Severe by AVA (0.9cm^2) and moderate to  severe by DI (0.26). Low SV index (23cc/m2), suspect paradoxical low flow  low gradient moderate to severe AS   FINDINGS   Left Ventricle: Left ventricular ejection fraction, by estimation, is 60  to 65%. The left ventricle has normal function. The left ventricle has no  regional wall motion abnormalities. The left ventricular internal cavity  size was small. There is moderate   left ventricular hypertrophy. Left ventricular diastolic parameters are  consistent with Grade I diastolic dysfunction (impaired relaxation).   Right Ventricle: The right ventricular size is normal. No increase in  right ventricular wall thickness. Right ventricular systolic function is  normal. There is normal pulmonary artery systolic pressure. The tricuspid  regurgitant velocity is 1.97 m/s, and   with an assumed right atrial pressure of 3 mmHg, the estimated right  ventricular systolic pressure is 18.5 mmHg.   Left Atrium: Left atrial size was normal in size.   Right Atrium: Right atrial size was normal in size.   Pericardium: There is no evidence of pericardial effusion.   Mitral Valve: The mitral valve is normal in structure. Trivial mitral  valve regurgitation. No evidence of mitral valve stenosis.   Tricuspid Valve: The tricuspid valve is normal in structure. Tricuspid  valve regurgitation is trivial.   Aortic Valve: The aortic valve is calcified. There is severe calcifcation  of the aortic valve. Aortic valve regurgitation is trivial. Aortic  regurgitation PHT measures 505 msec. Moderate to severe aortic  stenosis is  present. Aortic valve mean gradient  measures 19.0 mmHg. Aortic valve peak gradient measures 28.9 mmHg. Aortic  valve area, by VTI measures 0.82 cm.   Pulmonic Valve: The pulmonic valve was not well visualized. Pulmonic valve  regurgitation is trivial.   Aorta: The aortic root and ascending aorta are structurally normal, with  no evidence of dilitation.   Venous: The inferior vena cava is normal in size with greater than 50%  respiratory variability, suggesting right atrial pressure of 3 mmHg.   IAS/Shunts: The interatrial septum was not well visualized.     LEFT VENTRICLE  PLAX 2D  LVIDd:         3.10 cm   Diastology  LVIDs:  2.30 cm   LV e' medial:    5.11 cm/s  LV PW:         1.40 cm   LV E/e' medial:  11.6  LV IVS:        1.40 cm   LV e' lateral:   6.64 cm/s  LVOT diam:     2.00 cm   LV E/e' lateral: 8.9  LV SV:         47  LV SV Index:   23  LVOT Area:     3.14 cm  LV IVRT:       77 msec     RIGHT VENTRICLE  RV Basal diam:  2.60 cm  RV S prime:     10.80 cm/s  TAPSE (M-mode): 1.6 cm  RVSP:           18.5 mmHg   LEFT ATRIUM             Index        RIGHT ATRIUM           Index  LA diam:        2.60 cm 1.28 cm/m   RA Pressure: 3.00 mmHg  LA Vol (A2C):   50.8 ml 25.01 ml/m  RA Area:     12.40 cm  LA Vol (A4C):   29.9 ml 14.72 ml/m  RA Volume:   25.90 ml  12.75 ml/m  LA Biplane Vol: 41.5 ml 20.43 ml/m   AORTIC VALVE                     PULMONIC VALVE  AV Area (Vmax):    0.85 cm      PV Vmax:       0.16 m/s  AV Area (Vmean):   0.75 cm      PV Peak grad:  0.1 mmHg  AV Area (VTI):     0.82 cm  AV Vmax:           269.00 cm/s  AV Vmean:          213.000 cm/s  AV VTI:            0.574 m  AV Peak Grad:      28.9 mmHg  AV Mean Grad:      19.0 mmHg  LVOT Vmax:         72.70 cm/s  LVOT Vmean:        50.800 cm/s  LVOT VTI:          0.149 m  LVOT/AV VTI ratio: 0.26  AI PHT:            505 msec    AORTA  Ao Root diam: 3.60 cm  Ao Asc diam:   3.60 cm   MITRAL VALVE                TRICUSPID VALVE  MV Area (PHT):              TR Peak grad:   15.5 mmHg  MV Decel Time:              TR Vmax:        197.00 cm/s  MV E velocity: 59.20 cm/s   Estimated RAP:  3.00 mmHg  MV A velocity: 110.00 cm/s  RVSP:           18.5 mmHg  MV E/A ratio:  0.54  SHUNTS                              Systemic VTI:  0.15 m                              Systemic Diam: 2.00 cm   Recent Labs: No results found for requested labs within last 365 days.   Lipid Panel Followed in primary care   Wt Readings from Last 3 Encounters:  03/11/24 185 lb (83.9 kg)  03/05/24 182 lb 5.1 oz (82.7 kg)  02/20/24 184 lb 4.9 oz (83.6 kg)    Assessment and Plan:   1. CAD with stable angina: He is known to have mild CAD by cath at the [facility] in 2021. Normal stress test at the [facility] in spring 2022. No change in chronic chest pain. Continue statin, beta blocker and Imdur   2. HYPERTENSION: BP is well controlled. Continue Coreg  and Imdur      3. HYPERLIPIDEMIA: Lipids followed in primary care. LDL 45 in March 2025. Continue Lipitor.    4. Aortic stenosis:  He has moderate to severe low flow/low gradient aortic valve stenosis.  NYHA class 3 symptoms. I have personally reviewed the echo images. The aortic valve is thickened and calcified with limited leaflet mobility. It is difficult to know how much his valve disease in contributing to his dyspnea given his severe lung disease. We spoke today about his aortic stenosis and potential treatment options. Given advanced age, he is not a good candidate for conventional AVR by surgical approach. I think he may be a good candidate for TAVR. Again, a valve replacement may offer some relief from his dyspnea but given his lung disease, he may see no clinical improvement after AVR.   I have reviewed the natural history of aortic stenosis with the patient and their family members  who are present today. We have discussed the  limitations of medical therapy and the poor prognosis associated with symptomatic aortic stenosis. We have reviewed potential treatment options, including palliative medical therapy, conventional surgical aortic valve replacement, and transcatheter aortic valve replacement. We discussed treatment options in the context of the patient's specific comorbid medical conditions.   I will repeat his echo in 4 months and will see him back after that to review.   5. Amyloidosis: He is being followed in oncology. He has had chemotherapy and a stem cell transplant. He is in remission.   6. Pulmonary fibrosis/pulmonary embolism: He is bing followed in the pulmonary office for severe lung disease and is in pulmonary rehab. He remains on Eliquis  given history of PE.   Labs/ tests ordered today include:  Orders Placed This Encounter  Procedures   EKG 12-Lead   ECHOCARDIOGRAM COMPLETE   Disposition:   F/U with me in 4 months.   Signed, Lonni Cash, MD 03/11/2024 12:27 PM    [facility] [address] [place], [place], [place]  [address] Phone: [phone]; Fax: [phone]

## 2024-03-12 ENCOUNTER — Encounter (HOSPITAL_COMMUNITY)
Admission: RE | Admit: 2024-03-12 | Discharge: 2024-03-12 | Disposition: A | Source: Ambulatory Visit | Attending: Pulmonary Disease

## 2024-03-12 DIAGNOSIS — J8410 Pulmonary fibrosis, unspecified: Secondary | ICD-10-CM

## 2024-03-12 NOTE — Progress Notes (Signed)
 Home Exercise Prescription I have reviewed a Home Exercise Prescription with Alex NelsonSABRA [name] is not currently exercising at home. I discussed with his spouse exercise places. She told me both have access to the [facility] and fitness center at their church. I encouraged them to exercise 1 non-rehab day/wk for 30 min/day. Both agreed with my recommendations. I also encouraged Alex [name] to exercise on equipment similar to what we have here. Alex [name]'s spouse seems very motivated to kep him exercising. The patient stated that their goals were to increase functional capacity. We reviewed exercise guidelines, target heart rate during exercise, RPE Scale, weather conditions, endpoints for exercise, warmup and cool down. The patient is encouraged to come to me with any questions. I will continue to follow up with the patient to assist them with progression and safety. Spent 15 min with patient discussing home exercise plan and goals  Alex JINNY Moats, MS, ACSM-CEP 03/12/2024 3:40 PM

## 2024-03-12 NOTE — Progress Notes (Signed)
 Daily Session Note  Patient Details  Name: Alex [name]. MRN: [id number] Date of Birth: 19-Aug-1940 Referring Provider:   Conrad Ports Pulmonary Rehab Walk Test from 12/20/2023 in [facility]  Referring Provider Dr. Kara    Encounter Date: 03/12/2024  Check In:  Session Check In - 03/12/24 1122       Check-In   Supervising physician immediately available to respond to emergencies [facility] MD immediately available    Physician(s) Josefa Beauvais, NP    Location [facility]    Staff Present Augustin Sharps, Neita Moats, MS, ACSM-CEP, Exercise Physiologist;Mary Harvy, RN, BSN;Randi Midge BS, ACSM-CEP, Exercise Physiologist;Maria Whitaker, RN, BSN    Virtual Visit No    Medication changes reported     No    Fall or balance concerns reported    No    Tobacco Cessation No Change    Warm-up and Cool-down Performed as group-led Writer Performed Yes    VAD Patient? No    PAD/SET Patient? No      Pain Assessment   Currently in Pain? No/denies    Multiple Pain Sites No          Capillary Blood Glucose: No results found for this or any previous visit (from the past 24 hours).    Social History   Tobacco Use  Smoking Status Former   Current packs/day: 0.00   Average packs/day: 3.0 packs/day for 20.0 years (60.0 ttl pk-yrs)   Types: Cigarettes   Start date: 04/25/1949   Quit date: 04/25/1969   Years since quitting: 54.9  Smokeless Tobacco Former   Types: Chew   Quit date: 11/07/1983    Goals Met:  Proper associated with RPD/PD & O2 Sat Independence with exercise equipment Exercise tolerated well No report of concerns or symptoms today Strength training completed today  Goals Unmet:  Not Applicable  Comments: Service time is from 1018 to 1139.    Dr. Slater Staff is Medical Director for Pulmonary Rehab at [facility].

## 2024-03-14 ENCOUNTER — Encounter (HOSPITAL_COMMUNITY)
Admission: RE | Admit: 2024-03-14 | Discharge: 2024-03-14 | Disposition: A | Source: Ambulatory Visit | Attending: Pulmonary Disease

## 2024-03-14 DIAGNOSIS — J8410 Pulmonary fibrosis, unspecified: Secondary | ICD-10-CM | POA: Diagnosis not present

## 2024-03-14 NOTE — Progress Notes (Signed)
 Daily Session Note  Patient Details  Name: Alex [name]. MRN: [id number] Date of Birth: Jan 17, 1941 Referring Provider:   Conrad Ports Pulmonary Rehab Walk Test from 12/20/2023 in [facility]  Referring Provider Dr. Kara    Encounter Date: 03/14/2024  Check In:  Session Check In - 03/14/24 1106       Check-In   Supervising physician immediately available to respond to emergencies [facility] MD immediately available    Physician(s) Orren Fabry, NP    Location [facility]    Staff Present Augustin Sharps, Neita Moats, MS, ACSM-CEP, Exercise Physiologist;Mary Harvy, RN, BSN;Randi Reeve BS, ACSM-CEP, Exercise Physiologist    Virtual Visit No    Medication changes reported     No    Fall or balance concerns reported    No    Tobacco Cessation No Change    Warm-up and Cool-down Performed as group-led instruction    Resistance Training Performed Yes    VAD Patient? No    PAD/SET Patient? No      Pain Assessment   Currently in Pain? No/denies    Multiple Pain Sites No          Capillary Blood Glucose: No results found for this or any previous visit (from the past 24 hours).    Social History   Tobacco Use  Smoking Status Former   Current packs/day: 0.00   Average packs/day: 3.0 packs/day for 20.0 years (60.0 ttl pk-yrs)   Types: Cigarettes   Start date: 04/25/1949   Quit date: 04/25/1969   Years since quitting: 54.9  Smokeless Tobacco Former   Types: Chew   Quit date: 11/07/1983    Goals Met:  Proper associated with RPD/PD & O2 Sat Independence with exercise equipment Exercise tolerated well No report of concerns or symptoms today Strength training completed today  Goals Unmet:  Not Applicable  Comments: Service time is from 1010 to 1145.    Dr. Slater Staff is Medical Director for Pulmonary Rehab at [facility].

## 2024-03-19 ENCOUNTER — Encounter (HOSPITAL_COMMUNITY)
Admission: RE | Admit: 2024-03-19 | Discharge: 2024-03-19 | Disposition: A | Discharge status: Home / Self Care | Source: Ambulatory Visit | Attending: Pulmonary Disease | Admitting: Pulmonary Disease

## 2024-03-19 VITALS — Wt 185.8 lb

## 2024-03-19 DIAGNOSIS — J8410 Pulmonary fibrosis, unspecified: Secondary | ICD-10-CM

## 2024-03-19 NOTE — Progress Notes (Signed)
 Daily Session Note  Patient Details  Name: Alex [name]. MRN: [id number] Date of Birth: [birth year] Referring Provider:   Conrad Ports Pulmonary Rehab Walk Test from 12/20/2023 in [facility] for [facility], V[facility], & [facility]  Referring Provider Dr. Kara    Encounter Date: 03/19/2024  Check In:  Session Check In - 03/19/24 1129       Check-In   Supervising physician immediately available to respond to emergencies [facility] MD immediately available    Physician(s) Rosabel Mose, NP    [facility]    Staff Present Augustin Sharps, Neita Moats, MS, ACSM-CEP, Exercise Physiologist;Mary Harvy, RN, BSN;Randi Reeve BS, ACSM-CEP, Exercise Physiologist    Virtual Visit No    Medication changes reported     No    Fall or balance concerns reported    No    Tobacco Cessation No Change    Warm-up and Cool-down Performed as group-led instruction    Resistance Training Performed Yes    VAD Patient? No    PAD/SET Patient? No      Pain Assessment   Currently in Pain? No/denies    Multiple Pain Sites No          Capillary Blood Glucose: No results found for this or any previous visit (from the past 24 hours).   Exercise Prescription Changes - 03/19/24 1200       Response to Exercise   Blood Pressure (Admit) 134/80    Blood Pressure (Exercise) 144/76    Blood Pressure (Exit) 112/70    Heart Rate (Admit) 73 bpm    Heart Rate (Exercise) 93 bpm    Heart Rate (Exit) 78 bpm    Oxygen  Saturation (Admit) 96 %    Oxygen  Saturation (Exercise) 94 %    Oxygen  Saturation (Exit) 97 %    Rating of Perceived Exertion (Exercise) 16    Perceived Dyspnea (Exercise) 3    Duration Continue with 30 min of aerobic exercise without signs/symptoms of physical distress.    Intensity THRR unchanged      Progression   Progression Continue to progress workloads to maintain intensity without signs/symptoms of physical distress.      Resistance  Training   Training Prescription Yes    Weight red bands    Reps 10-15    Time 10 Minutes      NuStep   Level 4    SPM 90    Minutes 15    METs 3.2      Track   Laps 10    Minutes 15    METs 2.54          Social History   Tobacco Use  Smoking Status Former   Current packs/day: 0.00   Average packs/day: 3.0 packs/day for 20.0 years (60.0 ttl pk-yrs)   Types: Cigarettes   Start date: 04/25/1949   Quit date: 04/25/1969   Years since quitting: 54.9  Smokeless Tobacco Former   Types: Chew   Quit date: 11/07/1983    Goals Met:  Proper associated with RPD/PD & O2 Sat Independence with exercise equipment Exercise tolerated well No report of concerns or symptoms today Strength training completed today  Goals Unmet:  Not Applicable  Comments: Service time is from 1016 to 1148.    Dr. Slater Staff is Medical Director for Pulmonary Rehab at [facility].

## 2024-03-25 NOTE — Progress Notes (Signed)
 Discharge Progress Report  Patient Details  Name: Alex [name]. MRN: [id number] Date of Birth: [birth year] Referring Provider:   Conrad Ports Pulmonary Rehab Walk Test from 12/20/2023 in [facility] for [facility], V[facility], & Lung [facility]  Referring Provider Dr. Kara     Number of Visits: 24  Reason for Discharge:  Patient has met program and personal goals.  Smoking History:  Social History   Tobacco Use  Smoking Status Former   Current packs/day: 0.00   Average packs/day: 3.0 packs/day for 20.0 years (60.0 ttl pk-yrs)   Types: Cigarettes   Start date: 04/25/1949   Quit date: 04/25/1969   Years since quitting: 54.9  Smokeless Tobacco Former   Types: Chew   Quit date: 11/07/1983    Diagnosis:  Pulmonary fibrosis (HCC)  ADL UCSD:  Pulmonary Assessment Scores     Row Name 12/20/23 1439 03/14/24 1537       ADL UCSD   ADL Phase Entry Exit    SOB Score total 84 64      CAT Score   CAT Score 31 23      mMRC Score   mMRC Score 4 --       Initial Exercise Prescription:  Initial Exercise Prescription - 12/20/23 1400       Date of Initial Exercise RX and Referring Provider   Date 12/20/23    Referring Provider Dr. Kara    Expected Discharge Date 03/19/24      NuStep   Level 1    SPM 75    Minutes 15    METs 1.8      Track   Laps 6    Minutes 15    METs 1.9      Prescription Details   Frequency (times per week) 2    Duration Progress to 30 minutes of continuous aerobic without signs/symptoms of physical distress      Intensity   THRR 40-80% of Max Heartrate 56-112    Ratings of Perceived Exertion 11-13    Perceived Dyspnea 0-4      Progression   Progression Continue to progress workloads to maintain intensity without signs/symptoms of physical distress.      Resistance Training   Training Prescription Yes    Weight red bands    Reps 10-15          Discharge Exercise Prescription (Final Exercise Prescription  Changes):  Exercise Prescription Changes - 03/19/24 1200       Response to Exercise   Blood Pressure (Admit) 134/80    Blood Pressure (Exercise) 144/76    Blood Pressure (Exit) 112/70    Heart Rate (Admit) 73 bpm    Heart Rate (Exercise) 93 bpm    Heart Rate (Exit) 78 bpm    Oxygen  Saturation (Admit) 96 %    Oxygen  Saturation (Exercise) 94 %    Oxygen  Saturation (Exit) 97 %    Rating of Perceived Exertion (Exercise) 16    Perceived Dyspnea (Exercise) 3    Duration Continue with 30 min of aerobic exercise without signs/symptoms of physical distress.    Intensity THRR unchanged      Progression   Progression Continue to progress workloads to maintain intensity without signs/symptoms of physical distress.      Resistance Training   Training Prescription Yes    Weight red bands    Reps 10-15    Time 10 Minutes      NuStep   Level 4  SPM 90    Minutes 15    METs 3.2      Track   Laps 10    Minutes 15    METs 2.54          Functional Capacity:  6 Minute Walk     Row Name 12/20/23 1445 03/14/24 1547       6 Minute Walk   Phase Initial Discharge    Distance 629 feet 1022 feet    Distance % Change -- 62.48 %    Distance Feet Change -- 393 ft    Walk Time 6 minutes 6 minutes    # of Rest Breaks 1  4:31-5:13 0    MPH 1.19 1.94    METS 1.08 1.92    RPE 15 17    Perceived Dyspnea  4 3    VO2 Peak 3.79 6.73    Symptoms No No    Resting HR 75 bpm 83 bpm    Resting BP 108/60 118/68    Resting Oxygen  Saturation  95 % 96 %    Exercise Oxygen  Saturation  during 6 min walk 94 % 94 %    Max Ex. HR 75 bpm 19 bpm    Max Ex. BP 126/70 124/60    2 Minute Post BP 120/70 120/70      Interval HR   1 Minute HR 73 86    2 Minute HR 74 84    3 Minute HR 71 91    4 Minute HR 71 84    5 Minute HR 72 88    6 Minute HR 66 87    2 Minute Post HR 62 83    Interval Heart Rate? Yes Yes      Interval Oxygen    Interval Oxygen ? Yes Yes    Baseline Oxygen  Saturation % 95 % 96  %    1 Minute Oxygen  Saturation % 96 % 97 %    1 Minute Liters of Oxygen  0 L 0 L    2 Minute Oxygen  Saturation % 96 % 96 %    2 Minute Liters of Oxygen  0 L 0 L    3 Minute Oxygen  Saturation % 95 % 95 %    3 Minute Liters of Oxygen  0 L 0 L    4 Minute Oxygen  Saturation % 95 % 95 %    4 Minute Liters of Oxygen  0 L 0 L    5 Minute Oxygen  Saturation % 94 % 94 %    5 Minute Liters of Oxygen  0 L 0 L    6 Minute Oxygen  Saturation % 95 % 94 %    6 Minute Liters of Oxygen  0 L 0 L    2 Minute Post Oxygen  Saturation % 95 % 95 %    2 Minute Post Liters of Oxygen  0 L 0 L       Psychological, QOL, Others - Outcomes: PHQ 2/9:    03/14/2024    3:38 PM 12/20/2023    1:31 PM 06/19/2020    9:44 AM 06/19/2020    9:43 AM  Depression screen PHQ 2/9  Decreased Interest 0 0  0  Down, Depressed, Hopeless 0 0  0  PHQ - 2 Score 0 0  0  Altered sleeping 0 0 0   Tired, decreased energy 1 1 3    Change in appetite 3 1 0   Feeling bad or failure about yourself  0 0 0   Trouble concentrating 0 0  1   Moving slowly or fidgety/restless 0 1 0   Suicidal thoughts 0 0 0   PHQ-9 Score 4 3     Difficult doing work/chores Not difficult at all Very difficult Somewhat difficult      Data saved with a previous flowsheet row definition    Quality of Life:   Personal Goals: Goals established at orientation with interventions provided to work toward goal.  Personal Goals and Risk Factors at Admission - 12/20/23 1353       Core Components/Risk Factors/Patient Goals on Admission   Improve shortness of breath with ADL's Yes    Intervention Provide education, individualized exercise plan and daily activity instruction to help decrease symptoms of SOB with activities of daily living.    Expected Outcomes Short Term: Improve cardiorespiratory fitness to achieve a reduction of symptoms when performing ADLs;Long Term: Be able to perform more ADLs without symptoms or delay the onset of symptoms           Personal  Goals Discharge:  Goals and Risk Factor Review     Row Name 12/26/23 0944 01/12/24 1503 02/12/24 1413 03/11/24 0945 03/25/24 0937     Core Components/Risk Factors/Patient Goals Review   Personal Goals Review Improve shortness of breath with ADL's;Develop more efficient breathing techniques such as purse lipped breathing and diaphragmatic breathing and practicing self-pacing with activity. Improve shortness of breath with ADL's;Develop more efficient breathing techniques such as purse lipped breathing and diaphragmatic breathing and practicing self-pacing with activity. Improve shortness of breath with ADL's Improve shortness of breath with ADL's Improve shortness of breath with ADL's   Review Buddy has not started PR yet. Unable to assess his goals at this time. Monthly review of patient's Core Components/Risk Factors/Patient Goals are as follows: Goal progressing for improving shortness of breath. Goal progressing for developing more efficient breathing techniques such as purse lipped breathing and diaphragmatic breathing; and practicing self-pacing with activity. Buddy is currently exercising on RA to keep sats >88%. We will continue to monitor his progress thorughout the program. Monthly review of patient's Core Components/Risk Factors/Patient Goals are as follows: Goal progressing for improving shortness of breath with ADL's. Buddy is currently exercising on RA to keep sats >88%. Goal met for developing more efficient breathing techniques such as purse lipped breathing and diaphragmatic breathing; and practicing self-pacing with activity. He has attended the breathing technique class and has been practicing diaphragmatic breathing at home. He demonstrates PLB when he becomes SOB. He knows how to self pace based on his RPE/dyspnea score. We will continue to monitor his progress thorughout the program. Monthly review of patient's Core Components/Risk Factors/Patient Goals are as follows: Goal progressing  for improving shortness of breath with ADL's. Buddy is currently exercising on RA to keep sats >88%. He has been able to increase his laps while walking the track and increase his workload and MET's on the Nustep. We will continue to monitor his progress thorughout the program. Buddy graduated for the PR program on 03/19/24. He met his goal for improving shortness of breath with ADL's. He was able to increase his endurance and stamina while in the program. His SOB scores   decreased from 84 to 64. Buddy did great during the program.   Expected Outcomes See admission goals. To improve shortness of breath with ADL's and develop more efficient breathing techniques such as purse lipped breathing and diaphragmatic breathing; and practicing self-pacing with activity. To improve shortness of breath with ADL's To improve shortness of  breath with ADL's To continue to exercise and modify his lifestyle post graduation      Exercise Goals and Review:  Exercise Goals     Row Name 12/20/23 1334             Exercise Goals   Increase Physical Activity Yes       Intervention Provide advice, education, support and counseling about physical activity/exercise needs.;Develop an individualized exercise prescription for aerobic and resistive training based on initial evaluation findings, risk stratification, comorbidities and participant's personal goals.       Expected Outcomes Short Term: Attend rehab on a regular basis to increase amount of physical activity.;Long Term: Add in home exercise to make exercise part of routine and to increase amount of physical activity.;Long Term: Exercising regularly at least 3-5 days a week.       Increase Strength and Stamina Yes       Intervention Provide advice, education, support and counseling about physical activity/exercise needs.;Develop an individualized exercise prescription for aerobic and resistive training based on initial evaluation findings, risk stratification,  comorbidities and participant's personal goals.       Expected Outcomes Short Term: Increase workloads from initial exercise prescription for resistance, speed, and METs.;Short Term: Perform resistance training exercises routinely during rehab and add in resistance training at home;Long Term: Improve cardiorespiratory fitness, muscular endurance and strength as measured by increased METs and functional capacity ( )       Able to understand and use rate of perceived exertion (RPE) scale Yes       Intervention Provide education and explanation on how to use RPE scale       Expected Outcomes Short Term: Able to use RPE daily in rehab to express subjective intensity level;Long Term:  Able to use RPE to guide intensity level when exercising independently       Able to understand and use Dyspnea scale Yes       Intervention Provide education and explanation on how to use Dyspnea scale       Expected Outcomes Short Term: Able to use Dyspnea scale daily in rehab to express subjective sense of shortness of breath during exertion;Long Term: Able to use Dyspnea scale to guide intensity level when exercising independently       Knowledge and understanding of Target Heart Rate Range (THRR) Yes       Intervention Provide education and explanation of THRR including how the numbers were predicted and where they are located for reference       Expected Outcomes Short Term: Able to state/look up THRR;Long Term: Able to use THRR to govern intensity when exercising independently;Short Term: Able to use daily as guideline for intensity in rehab       Understanding of Exercise Prescription Yes       Intervention Provide education, explanation, and written materials on patient's individual exercise prescription       Expected Outcomes Short Term: Able to explain program exercise prescription;Long Term: Able to explain home exercise prescription to exercise independently          Exercise Goals Re-Evaluation:  Exercise  Goals Re-Evaluation     Row Name 12/22/23 [phone] 01/15/24 0916 02/09/24 0923 03/19/24 1607       Exercise Goal Re-Evaluation   Exercise Goals Review Increase Physical Activity;Increase Strength and Stamina;Able to understand and use rate of perceived exertion (RPE) scale;Able to understand and use Dyspnea scale;Knowledge and understanding of Target Heart Rate Range (THRR);Understanding of Exercise Prescription Increase Physical Activity;Increase  Strength and Stamina;Able to understand and use rate of perceived exertion (RPE) scale;Able to understand and use Dyspnea scale;Knowledge and understanding of Target Heart Rate Range (THRR);Understanding of Exercise Prescription Increase Physical Activity;Increase Strength and Stamina;Able to understand and use rate of perceived exertion (RPE) scale;Able to understand and use Dyspnea scale;Knowledge and understanding of Target Heart Rate Range (THRR);Understanding of Exercise Prescription Increase Physical Activity;Increase Strength and Stamina;Able to understand and use rate of perceived exertion (RPE) scale;Able to understand and use Dyspnea scale;Knowledge and understanding of Target Heart Rate Range (THRR);Understanding of Exercise Prescription    Comments Buddy is schedule to begin exercise on 9/4. Will continue to monitor and progress as able. Buddy has completed 5 exercise sessions. He exercises for 15 min on the track and Nustep. Buddy averages 2.08 METs on the track and 2.2 METs at level 2 on the Nustep. He performs the warmup and cooldown standing holding onto a chair for balance. He has increased his laps on the track and level on the Nustep. METs have also increased. Will continue to monitor and progress as able. Buddy has completed 13 exercise sessions. He exercises for 15 min on the track and Nustep. Buddy averages 2.23 METs on the track and 2.6 METs at level 2 on the Nustep. He performs the warmup and cooldown standing holding onto a chair for balance.  Buddy continues to slowly increase his track laps. His level on the Nustep remains the same. It is difficult to progress Buddy as I am unsure if he understands the RPE scale. Staff have reiterated the RPE scale every single exercise sessions. Buddy still rates exercise as very hard. Will discuss scale with pt again. Will continue to monitor and progress as able. Buddy has completed 24 exercise sessions. His peak METs were 2.54 on the track and 3.2 on the Nustep. He plans to continue exercise at home or at a local gym. His wife is in support of this.    Expected Outcomes Through exercise at rehab and home, the patient will decrease shortness of breath with daily activities and feel confident in carrying out an exercise regimen at home. Through exercise at rehab and home, the patient will decrease shortness of breath with daily activities and feel confident in carrying out an exercise regimen at home. Through exercise at rehab and home, the patient will decrease shortness of breath with daily activities and feel confident in carrying out an exercise regimen at home. Through exercise at rehab and home, the patient will decrease shortness of breath with daily activities and feel confident in carrying out an exercise regimen at home.       Nutrition & Weight - Outcomes:  Pre Biometrics - 12/20/23 1453       Pre Biometrics   Grip Strength 20 kg           Nutrition:   Nutrition Discharge:   Education Questionnaire Score:  Knowledge Questionnaire Score - 03/14/24 1537       Knowledge Questionnaire Score   Post Score 18/18          Goals reviewed with patient; copy given to patient.

## 2024-03-29 ENCOUNTER — Ambulatory Visit: Admitting: Pulmonary Disease

## 2024-04-02 ENCOUNTER — Ambulatory Visit: Admitting: Pulmonary Disease

## 2024-04-02 ENCOUNTER — Encounter: Payer: Self-pay | Admitting: Pulmonary Disease

## 2024-04-02 VITALS — BP 108/78 | HR 80 | Ht 71.0 in | Wt 187.0 lb

## 2024-04-02 NOTE — Patient Instructions (Signed)
 Continue combivent inhaler as needed   Continue CPAP with Oxygen  at night    Talk with your Sleep team at the [facility] about re-testing you for sleep apnea given your weight loss. You may not require CPAP therapy and could get by with oxygen  alone  Your CT Chest scan is stable  Follow up in 1 year

## 2024-04-02 NOTE — Progress Notes (Unsigned)
 Established Patient Pulmonology Office Visit   Subjective:  Patient ID: Alex [name]., male    DOB: [birth year]  MRN: [id number]  CC:  Chief Complaint  Patient presents with   Medical Management of Chronic Issues    Pt states review CT    Discussed the use of AI scribe software for clinical note transcription with the patient, who gave verbal consent to proceed.  History of Present Illness       {PULM QUESTIONNAIRES (Optional):33196}  ROS  {History (Optional):23778}  Current Outpatient Medications:    albuterol  (ACCUNEB ) 1.25 MG/3ML nebulizer solution, Take 1 ampule by nebulization every 6 (six) hours as needed for shortness of breath or wheezing., Disp: , Rfl:    apixaban  (ELIQUIS ) 5 MG TABS tablet, Take 5 mg by mouth 2 (two) times daily., Disp: , Rfl:    Ascorbic Acid  (VITAMIN C ) 1000 MG tablet, Take 1,000 mg by mouth daily., Disp: , Rfl:    atorvastatin  (LIPITOR) 40 MG tablet, Take 40 mg by mouth daily., Disp: , Rfl:    capsaicin (ZOSTRIX) 0.025 % cream, Apply 1 Application topically as needed (aches and pains)., Disp: , Rfl:    carvedilol  (COREG ) 6.25 MG tablet, Take 3.125-6.25 mg by mouth See admin instructions. 6.25 mg in the morning, 3.125 mg in the evening, Disp: , Rfl:    Cholecalciferol  125 MCG (5000 UT) TABS, Take 5,000 Units by mouth 2 (two) times a day., Disp: , Rfl:    diclofenac Sodium (VOLTAREN) 1 % GEL, Apply topically., Disp: , Rfl:    ezetimibe  (ZETIA ) 10 MG tablet, Take 10 mg by mouth daily., Disp: , Rfl:    fluticasone  (FLONASE ) 50 MCG/ACT nasal spray, Place 2 sprays into both nostrils daily., Disp: 16 g, Rfl: 5   furosemide  (LASIX ) 40 MG tablet, Take 40 mg by mouth daily. , Disp: , Rfl:    Ipratropium-Albuterol  (COMBIVENT RESPIMAT) 20-100 MCG/ACT AERS respimat, Inhale 1 puff into the lungs every 6 (six) hours as needed for wheezing or shortness of breath., Disp: , Rfl:    isosorbide  mononitrate (IMDUR ) 30 MG 24 hr tablet, Take 30 mg by mouth  daily., Disp: , Rfl:    lidocaine  (LIDODERM ) 5 %, Place 1 patch onto the skin daily as needed (pain)., Disp: , Rfl:    Multiple Vitamin (MULTI-VITAMINS) TABS, Take 1 tablet by mouth at bedtime., Disp: , Rfl:    mupirocin  ointment (BACTROBAN ) 2 %, Apply 1 Application topically daily as needed (wound care)., Disp: , Rfl:    nitroGLYCERIN  (NITROSTAT ) 0.4 MG SL tablet, DISSOLVE ONE TABLET UNDER TONGUE EVERY 5 MINUTES AS NEEDED UP TO 3 DOSES, IF NORELIEF CALL 911, Disp: 25 tablet, Rfl: 11   pantoprazole  (PROTONIX ) 40 MG tablet, Take 1 tablet (40 mg total) by mouth 2 (two) times daily., Disp: 180 tablet, Rfl: 1   potassium chloride  SA (KLOR-CON ) 20 MEQ tablet, Take 20 mEq by mouth daily., Disp: , Rfl:    Probiotic Product (PROBIOTIC-10 PO), Take 1 capsule by mouth daily., Disp: , Rfl:    tamsulosin  (FLOMAX ) 0.4 MG CAPS capsule, Take 0.4 mg by mouth 2 (two) times daily., Disp: , Rfl:    triamcinolone  cream (KENALOG ) 0.1 %, Apply 1 Application topically daily as needed (irritation)., Disp: , Rfl:    vitamin B-12 (CYANOCOBALAMIN) 250 MCG tablet, Take 500 mcg by mouth daily., Disp: , Rfl:    zinc gluconate 50 MG tablet, Take 50 mg by mouth daily., Disp: , Rfl:  Objective:  BP 108/78   Pulse 80   Ht 5' 11 (1.803 m) Comment: per pt  Wt 187 lb (84.8 kg)   SpO2 95%   BMI 26.08 kg/m   {Pulm Vitals (Optional):32837}  Physical Exam   Diagnostic Review:  {Labs (Optional):32838}     Assessment & Plan:   Assessment & Plan   Assessment and Plan Assessment & Plan       No follow-ups on file.   Dorn KATHEE Chill, MD

## 2024-04-11 ENCOUNTER — Encounter: Payer: Self-pay | Admitting: Pulmonary Disease

## 2024-04-11 NOTE — Assessment & Plan Note (Signed)
 SABRA

## 2024-04-17 ENCOUNTER — Emergency Department (HOSPITAL_COMMUNITY)

## 2024-04-17 ENCOUNTER — Emergency Department (HOSPITAL_COMMUNITY)
Admission: EM | Admit: 2024-04-17 | Discharge: 2024-04-17 | Disposition: A | Discharge status: Home / Self Care | Attending: Emergency Medicine | Admitting: Emergency Medicine

## 2024-04-17 DIAGNOSIS — R079 Chest pain, unspecified: Secondary | ICD-10-CM | POA: Diagnosis present

## 2024-04-17 DIAGNOSIS — Z7901 Long term (current) use of anticoagulants: Secondary | ICD-10-CM | POA: Insufficient documentation

## 2024-04-17 DIAGNOSIS — R072 Precordial pain: Secondary | ICD-10-CM | POA: Diagnosis not present

## 2024-04-17 DIAGNOSIS — J8410 Pulmonary fibrosis, unspecified: Secondary | ICD-10-CM | POA: Insufficient documentation

## 2024-04-17 LAB — COMPREHENSIVE METABOLIC PANEL WITH GFR
ALT: 18 U/L (ref 0–44)
AST: 34 U/L (ref 15–41)
Albumin: 3.8 g/dL (ref 3.5–5.0)
Alkaline Phosphatase: 80 U/L (ref 38–126)
Anion gap: 11 (ref 5–15)
BUN: 30 mg/dL — ABNORMAL HIGH (ref 8–23)
CO2: 24 mmol/L (ref 22–32)
Calcium: 8.7 mg/dL — ABNORMAL LOW (ref 8.9–10.3)
Chloride: 105 mmol/L (ref 98–111)
Creatinine, Ser: 1.23 mg/dL (ref 0.61–1.24)
GFR, Estimated: 59 mL/min — ABNORMAL LOW
Glucose, Bld: 96 mg/dL (ref 70–99)
Potassium: 3.6 mmol/L (ref 3.5–5.1)
Sodium: 140 mmol/L (ref 135–145)
Total Bilirubin: 1.2 mg/dL (ref 0.0–1.2)
Total Protein: 6.5 g/dL (ref 6.5–8.1)

## 2024-04-17 LAB — CBC WITH DIFFERENTIAL/PLATELET
Abs Immature Granulocytes: 0.04 K/uL (ref 0.00–0.07)
Basophils Absolute: 0.1 K/uL (ref 0.0–0.1)
Basophils Relative: 1 %
Eosinophils Absolute: 0.5 K/uL (ref 0.0–0.5)
Eosinophils Relative: 5 %
HCT: 42.1 % (ref 39.0–52.0)
Hemoglobin: 14.7 g/dL (ref 13.0–17.0)
Immature Granulocytes: 1 %
Lymphocytes Relative: 21 %
Lymphs Abs: 1.8 K/uL (ref 0.7–4.0)
MCH: 31.2 pg (ref 26.0–34.0)
MCHC: 34.9 g/dL (ref 30.0–36.0)
MCV: 89.4 fL (ref 80.0–100.0)
Monocytes Absolute: 0.7 K/uL (ref 0.1–1.0)
Monocytes Relative: 8 %
Neutro Abs: 5.5 K/uL (ref 1.7–7.7)
Neutrophils Relative %: 64 %
Platelets: 177 K/uL (ref 150–400)
RBC: 4.71 MIL/uL (ref 4.22–5.81)
RDW: 13 % (ref 11.5–15.5)
WBC: 8.6 K/uL (ref 4.0–10.5)
nRBC: 0 % (ref 0.0–0.2)

## 2024-04-17 LAB — I-STAT CHEM 8, ED
BUN: 34 mg/dL — ABNORMAL HIGH (ref 8–23)
Calcium, Ion: 1.09 mmol/L — ABNORMAL LOW (ref 1.15–1.40)
Chloride: 103 mmol/L (ref 98–111)
Creatinine, Ser: 1.3 mg/dL — ABNORMAL HIGH (ref 0.61–1.24)
Glucose, Bld: 92 mg/dL (ref 70–99)
HCT: 42 % (ref 39.0–52.0)
Hemoglobin: 14.3 g/dL (ref 13.0–17.0)
Potassium: 3.6 mmol/L (ref 3.5–5.1)
Sodium: 142 mmol/L (ref 135–145)
TCO2: 25 mmol/L (ref 22–32)

## 2024-04-17 LAB — RESP PANEL BY RT-PCR (RSV, FLU A&B, COVID)  RVPGX2
Influenza A by PCR: NEGATIVE
Influenza B by PCR: NEGATIVE
Resp Syncytial Virus by PCR: NEGATIVE
SARS Coronavirus 2 by RT PCR: NEGATIVE

## 2024-04-17 LAB — PRO BRAIN NATRIURETIC PEPTIDE: Pro Brain Natriuretic Peptide: 163 pg/mL

## 2024-04-17 LAB — TROPONIN T, HIGH SENSITIVITY
Troponin T High Sensitivity: <15 ng/L (ref 0–19)
Troponin T High Sensitivity: <15 ng/L (ref 0–19)

## 2024-04-17 LAB — LIPASE, BLOOD: Lipase: 29 U/L (ref 11–51)

## 2024-04-17 MED ORDER — IOHEXOL 350 MG/ML SOLN
100.0000 mL | Freq: Once | INTRAVENOUS | Status: AC | PRN
  Administered 2024-04-17: 100 mL via INTRAVENOUS

## 2024-04-17 NOTE — ED Triage Notes (Signed)
 Patient BIB [facility] from home c/o chest pain started at 2:45 pm. Initially pain was in the middle of chest, then it is across his chest. Pain 8/10. EMS gave  three on Nitroglycerin  SL with no relief and receive Aspirin  325 mg. Patient is A & O x4. Vital signs: Bp 112/70,  HR 74, SPO2 98% RA  and CBG 108.

## 2024-04-17 NOTE — Discharge Instructions (Addendum)
 It was a pleasure taking care of you here today  As we discussed your workup today was reassuring.  Make sure to keep a close eye on your symptoms.  Call your cardiologist and pulmonologist and let them know you were seen here today for follow-up  Return for new or worsening symptoms

## 2024-04-17 NOTE — ED Provider Notes (Signed)
 " [place] [facility] Provider Note   CSN: [id number] Arrival date & time: 04/17/24  1615    Patient presents with: Chest Pain and Shortness of Breath   Alex [name]. is a 83 y.o. male here for evaluation of chest pain.  Started around 2 PM today.  Located to the middle of his chest into his upper abdomen.  He has been belching more than normal according to wife.  Rated the pain an 8/10.  Did not radiate to left arm, left back, jaw.  No associated diaphoresis, nausea, vomiting.  He has no exertional pleuritic chest pain.  Does state he has a known history of an aneurysm.  No bloody stool.  Has a chronic cough and shortness of breath wears as needed oxygen  at home.  EMS gave 325 aspirin  he took 3 nitroglycerin  which resolved his pain.  Currently pain-free here in the emergency department.  No PND, orthopnea, lower extremity swelling   HPI     Prior to Admission medications  Medication Sig Start Date End Date Taking? Authorizing Provider  albuterol  (ACCUNEB ) 1.25 MG/3ML nebulizer solution Take 1 ampule by nebulization every 6 (six) hours as needed for shortness of breath or wheezing. 01/11/22   [provider]  apixaban  (ELIQUIS ) 5 MG TABS tablet Take 5 mg by mouth 2 (two) times daily.    [provider]  Ascorbic Acid  (VITAMIN C ) 1000 MG tablet Take 1,000 mg by mouth daily.    [provider]  atorvastatin  (LIPITOR) 40 MG tablet Take 40 mg by mouth daily.    [provider]  capsaicin (ZOSTRIX) 0.025 % cream Apply 1 Application topically as needed (aches and pains). 06/16/21   [provider]  carvedilol  (COREG ) 6.25 MG tablet Take 3.125-6.25 mg by mouth See admin instructions. 6.25 mg in the morning, 3.125 mg in the evening    [provider]  Cholecalciferol  125 MCG (5000 UT) TABS Take 5,000 Units by mouth 2 (two) times a day.    [provider]  diclofenac Sodium (VOLTAREN) 1 % GEL Apply  topically. 01/05/23   [provider]  ezetimibe  (ZETIA ) 10 MG tablet Take 10 mg by mouth daily. 10/28/15   [provider]  fluticasone  (FLONASE ) 50 MCG/ACT nasal spray Place 2 sprays into both nostrils daily. 08/28/19   Mannam, Praveen, MD  furosemide  (LASIX ) 40 MG tablet Take 40 mg by mouth daily.     [provider]  Ipratropium-Albuterol  (COMBIVENT RESPIMAT) 20-100 MCG/ACT AERS respimat Inhale 1 puff into the lungs every 6 (six) hours as needed for wheezing or shortness of breath.    [provider]  isosorbide  mononitrate (IMDUR ) 30 MG 24 hr tablet Take 30 mg by mouth daily.    [provider]  lidocaine  (LIDODERM ) 5 % Place 1 patch onto the skin daily as needed (pain). 03/18/22   [provider]  Multiple Vitamin (MULTI-VITAMINS) TABS Take 1 tablet by mouth at bedtime. 04/24/17   [provider]  mupirocin  ointment (BACTROBAN ) 2 % Apply 1 Application topically daily as needed (wound care). 04/24/18   [provider]  nitroGLYCERIN  (NITROSTAT ) 0.4 MG SL tablet DISSOLVE ONE TABLET UNDER TONGUE EVERY 5 MINUTES AS NEEDED UP TO 3 DOSES, IF NORELIEF CALL 911 04/28/22   Verlin Lonni BIRCH, MD  pantoprazole  (PROTONIX ) 40 MG tablet Take 1 tablet (40 mg total) by mouth 2 (two) times daily. 10/19/23   Cirigliano, Vito V, DO  potassium chloride  SA (  KLOR-CON ) 20 MEQ tablet Take 20 mEq by mouth daily.    [provider]  Probiotic Product (PROBIOTIC-10 PO) Take 1 capsule by mouth daily.    [provider]  tamsulosin  (FLOMAX ) 0.4 MG CAPS capsule Take 0.4 mg by mouth 2 (two) times daily.    [provider]  triamcinolone  cream (KENALOG ) 0.1 % Apply 1 Application topically daily as needed (irritation). 12/14/21   [provider]  vitamin B-12 (CYANOCOBALAMIN) 250 MCG tablet Take 500 mcg by mouth daily.    [provider]  zinc gluconate 50 MG tablet Take 50 mg by mouth daily.    [provider]    Allergies: Ramipril and Augmentin [amoxicillin-pot clavulanate]    Review of Systems  Constitutional: Negative.   HENT: Negative.    Respiratory:  Positive for cough and shortness of breath.   Cardiovascular:  Positive for chest pain. Negative for palpitations and leg swelling.  Gastrointestinal: Negative.   Genitourinary: Negative.   Musculoskeletal: Negative.   Skin: Negative.   Neurological: Negative.   All other systems reviewed and are negative.   Updated Vital Signs BP 115/79 (BP Location: Right Arm)   Pulse 65   Temp (!) 97.4 F (36.3 C) (Oral)   Resp (!) 23   Ht 5' 11 (1.803 m)   Wt 82.6 kg   SpO2 98%   BMI 25.38 kg/m   Physical Exam Vitals and nursing note reviewed.  Constitutional:      General: He is not in acute distress.    Appearance: He is well-developed. He is not ill-appearing, toxic-appearing or diaphoretic.  HENT:     Head: Normocephalic and atraumatic.  Eyes:     Pupils: Pupils are equal, round, and reactive to light.  Cardiovascular:     Rate and Rhythm: Normal rate and regular rhythm.     Pulses:          Radial pulses are 2+ on the right side and 2+ on the left side.       Posterior tibial pulses are 1+ on the right side and 1+ on the left side.     Heart sounds: Normal heart sounds.  Pulmonary:     Effort: Pulmonary effort is normal. No respiratory distress.     Breath sounds: Normal breath sounds.     Comments: Speaks in full sentences without difficulty Chest:     Chest wall: No mass or tenderness.  Abdominal:     General: Bowel sounds are normal. There is no distension or abdominal bruit.     Palpations: Abdomen is soft. There is no hepatomegaly or mass.     Tenderness: There is no abdominal tenderness. There is no guarding or rebound.  Musculoskeletal:        General: Normal range of motion.     Cervical back: Normal range of motion and neck supple.     Right lower leg: No tenderness. No edema.     Left lower  leg: No tenderness. No edema.     Comments: No bony tenderness, compartments soft, full range of motion  Skin:    General: Skin is warm and dry.     Comments: No obvious rashes or lesions to exposed skin  Neurological:     General: No focal deficit present.     Mental Status: He is alert and oriented to person, place, and time.     Cranial Nerves: No cranial nerve deficit.     Motor: No weakness.     (  all labs ordered are listed, but only abnormal results are displayed) Labs Reviewed  COMPREHENSIVE METABOLIC PANEL WITH GFR - Abnormal; Notable for the following components:      Result Value   BUN 30 (*)    Calcium  8.7 (*)    GFR, Estimated 59 (*)    All other components within normal limits  I-STAT CHEM 8, ED - Abnormal; Notable for the following components:   BUN 34 (*)    Creatinine, Ser 1.30 (*)    Calcium , Ion 1.09 (*)    All other components within normal limits  RESP PANEL BY RT-PCR (RSV, FLU A&B, COVID)  [id number]  CBC WITH DIFFERENTIAL/PLATELET  LIPASE, BLOOD  PRO BRAIN NATRIURETIC PEPTIDE  TROPONIN T, HIGH SENSITIVITY  TROPONIN T, HIGH SENSITIVITY    EKG: EKG Interpretation Date/Time:  Wednesday April 17 2024 16:18:59 EST Ventricular Rate:  71 PR Interval:  197 QRS Duration:  161 QT Interval:  437 QTC Calculation: 475 R Axis:   -65  Text Interpretation: Sinus rhythm Right bundle branch block Anterolateral infarct, old No significant change since last tracing Confirmed by Patt Alm DEL [phone]) on 04/17/2024 8:55:00 PM  Radiology: CT Angio Chest/Abd/Pel for Dissection W and/or Wo Contrast Result Date: 04/17/2024 EXAM: CTA CHEST, ABDOMEN AND PELVIS WITHOUT AND WITH CONTRAST 04/17/2024 06:47:57 PM TECHNIQUE: CTA of the chest was performed without and with the administration of 100 mL of iohexol  (OMNIPAQUE ) 350 MG/ML injection. CTA of the abdomen and pelvis was performed without and with the administration of 100 mL of iohexol  (OMNIPAQUE ) 350 MG/ML injection.  Multiplanar reformatted images are provided for review. MIP images are provided for review. Automated exposure control, iterative reconstruction, and/or weight based adjustment of the mA/kV was utilized to reduce the radiation dose to as low as reasonably achievable. COMPARISON: 04/27/2023 CLINICAL HISTORY: Aortic aneurysm suspected FINDINGS: VASCULATURE: Aortic and coronary artery atherosclerotic calcification. AORTA: Aortic atherosclerotic calcification. Negative for acute aortic syndrome. Unchanged infrarenal abdominal aortic aneurysm measuring 3.0 cm. No dissection. PULMONARY ARTERIES: No pulmonary embolism with the limits of this exam. GREAT VESSELS OF AORTIC ARCH: No acute finding. No dissection. No arterial occlusion or significant stenosis. CELIAC TRUNK: Similar dilation of the celiac trunk measuring up to 1.3 cm. No acute finding. No occlusion or significant stenosis. SUPERIOR MESENTERIC ARTERY: No acute finding. No occlusion or significant stenosis. INFERIOR MESENTERIC ARTERY: No acute finding. No occlusion or significant stenosis. RENAL ARTERIES: No acute finding. No occlusion or significant stenosis. ILIAC ARTERIES: No acute finding. No occlusion or significant stenosis. CHEST: MEDIASTINUM: Coronary artery atherosclerotic calcification. No mediastinal lymphadenopathy. The heart and pericardium demonstrate no acute abnormality. LUNGS AND PLEURA: Bilateral subpleural reticular and ground glass opacities compatible with fibrosis. Bronchiolectasis in the lower lobes. Findings are not substantially changed from 04/27/2023. No focal consolidation or pulmonary edema. No evidence of pleural effusion or pneumothorax. THORACIC BONES AND SOFT TISSUES: Right shoulder arthroplasty. No acute bone or soft tissue abnormality. ABDOMEN AND PELVIS: LIVER: The liver is unremarkable. GALLBLADDER AND BILE DUCTS: Gallbladder is unremarkable. No biliary ductal dilatation. SPLEEN: The spleen is unremarkable. PANCREAS: The  pancreas is unremarkable. ADRENAL GLANDS: Bilateral adrenal glands demonstrate no acute abnormality. KIDNEYS, URETERS AND BLADDER: Bilateral cortical renal thinning. No stones in the kidneys or ureters. No hydronephrosis. No perinephric or periureteral stranding. Urinary bladder is unremarkable. GI AND BOWEL: Ground glass stranding about the distal descending colon that is unchanged from 04/27/2023, may represent a chronic omental infarct (series 6, image 193). Normal appendix. Colonic diverticulosis without evidence of  diverticulitis. Stomach and duodenal sweep demonstrate no acute abnormality. There is no bowel obstruction. No abnormal bowel wall thickening or distension. REPRODUCTIVE: Brachytherapy seeds in the prostate. PERITONEUM AND RETROPERITONEUM: No ascites or free air. LYMPH NODES: No lymphadenopathy. ABDOMINAL BONES AND SOFT TISSUES: No acute abnormality of the bones. No acute soft tissue abnormality. IMPRESSION: 1. No evidence of aortic aneurysm or acute aortic syndrome. 2. No pulmonary embolism. 3. Unchanged 3.0 cm infrarenal abdominal aortic aneurysm. Follow up examination in 3 years is recommended. 4. Fibrotic changes in the lungs similar to prior and suggestive of postinfectious scarring. Electronically signed by: Norman Gatlin MD 04/17/2024 07:04 PM EST RP Workstation: [id number]   DG Chest Portable 1 View Result Date: 04/17/2024 CLINICAL DATA:  Shortness of breath. EXAM: PORTABLE CHEST 1 VIEW COMPARISON:  Chest CT dated 03/08/2024. FINDINGS: Bilateral streaky densities predominantly involving the left lung base consistent with known interstitial lung disease. No consolidative changes. There is no pleural effusion or pneumothorax. The cardiac silhouette is within normal limits. Atherosclerotic calcification of the aorta. No acute osseous pathology. IMPRESSION: 1. No acute cardiopulmonary process. 2. Chronic interstitial lung disease. Electronically Signed   By: Vanetta Chou M.D.   On:  04/17/2024 16:46     Procedures   Medications Ordered in the ED  iohexol  (OMNIPAQUE ) 350 MG/ML injection 100 mL (100 mLs Intravenous Contrast Given 04/17/24 6038)   83 year old multiple medical comorbidities here for evaluation of chest pain.  Started a few hours PTA.  He has a chronic cough and shortness of breath with known pulmonary fibrosis.  Wears as needed oxygen  at home.  No change in his shortness of breath.  Has also had increase in belching.  Pain went from his mid chest into his abdomen.  Did not radiate to his back.  Received ASA and 3 nitroglycerin  which resolved his symptoms prior to arrival.  On arrival he is chest pain-free.  Here he is hemodynamically stable.  Does state he has a known history of an aneurysm.  He does not appear grossly fluid overloaded.  No recent surgery, immobilization or malignancy.  He is chronically anticoagulated has not missed any doses.  No melena or bright red blood per rectum.  Will plan on labs, imaging, reassess  Labs and imaging personally viewed interpreted CBC without leukocytosis, hemoglobin 14 Metabolic panel without significant abnormality Lipase 29 BNP 163 COVID, flu, RSV Troponin <15 x 2 EKG without ischemic changes Chest x-ray without significant abnormality CT dissection without evidence of dissection, his aneurysm is unchanged.  No obvious PE, infectious process  Clinical Course as of 04/17/24 2058  Wed Apr 17, 2024  1949 Patient reassessed.  No recurrence of chest pain, shortness of breath.  Wearing his as needed oxygen .  Requesting discharge home.  Discussed second troponin.  Agreeable to stay. [BH]    Clinical Course User Index [BH] Braddock Servellon A, [name]   Patient reassessed.  Has been in the emergency department for over 4-1/2 hours without recurrence of his pain.  Patient wants to go home.  Reassuring workup thus far.  Will have him keep a close eye on his symptoms.  I encouraged him to call his cardiologist and  pulmonologist and let them know he was seen here today.  Ambulatory wo difficulty.  Low suspicion for acute ACS, PE, dissection, pneumonia, pneumothorax, GI bleed, obstruction, perforation, cholecystitis, cholelithiasis, pancreatitis.  Will have him follow-up outpatient, return for worsening symptoms  The patient has been appropriately medically screened and/or stabilized in the ED. I  have low suspicion for any other emergent medical condition which would require further screening, evaluation or treatment in the ED or require inpatient management.  Patient is hemodynamically stable and in no acute distress.  Patient able to ambulate in department prior to ED.  Evaluation does not show acute pathology that would require ongoing or additional emergent interventions while in the emergency department or further inpatient treatment.  I have discussed the diagnosis with the patient and answered all questions.  Pain is been managed while in the emergency department and patient has no further complaints prior to discharge.  Patient is comfortable with plan discussed in room and is stable for discharge at this time.  I have discussed strict return precautions for returning to the emergency department.  Patient was encouraged to follow-up with PCP/specialist refer to at discharge.                                 Medical Decision Making Amount and/or Complexity of Data Reviewed Independent Historian: spouse and EMS External Data Reviewed: labs, radiology, ECG and notes. Labs: ordered. Decision-making details documented in ED Course. Radiology: ordered and independent interpretation performed. Decision-making details documented in ED Course. ECG/medicine tests: ordered and independent interpretation performed. Decision-making details documented in ED Course.  Risk OTC drugs. Prescription drug management. Parenteral controlled substances. Decision regarding hospitalization. Diagnosis or treatment  significantly limited by social determinants of health.        Final diagnoses:  Precordial pain  Chronic anticoagulation  Pulmonary fibrosis [facility])    ED Discharge Orders     None          Jenifer Struve A, [name] 04/17/24 2058    Patt Alm Macho, MD 04/17/24 2251  "

## 2024-07-10 ENCOUNTER — Ambulatory Visit (HOSPITAL_COMMUNITY)

## 2024-07-18 ENCOUNTER — Ambulatory Visit: Admitting: Cardiovascular Disease
